# Patient Record
Sex: Male | Born: 1935
Health system: Southern US, Community
[De-identification: ages and names within clinical notes are randomized; demographics above are authoritative.]

## PROBLEM LIST (undated history)

## (undated) DIAGNOSIS — N189 Chronic kidney disease, unspecified: Secondary | ICD-10-CM

## (undated) DIAGNOSIS — M1990 Unspecified osteoarthritis, unspecified site: Secondary | ICD-10-CM

## (undated) DIAGNOSIS — I639 Cerebral infarction, unspecified: Secondary | ICD-10-CM

## (undated) DIAGNOSIS — Z8719 Personal history of other diseases of the digestive system: Secondary | ICD-10-CM

## (undated) DIAGNOSIS — I10 Essential (primary) hypertension: Secondary | ICD-10-CM

## (undated) DIAGNOSIS — I4891 Unspecified atrial fibrillation: Secondary | ICD-10-CM

## (undated) DIAGNOSIS — E119 Type 2 diabetes mellitus without complications: Secondary | ICD-10-CM

## (undated) DIAGNOSIS — Z950 Presence of cardiac pacemaker: Secondary | ICD-10-CM

## (undated) DIAGNOSIS — K439 Ventral hernia without obstruction or gangrene: Secondary | ICD-10-CM

## (undated) DIAGNOSIS — C61 Malignant neoplasm of prostate: Secondary | ICD-10-CM

## (undated) DIAGNOSIS — I495 Sick sinus syndrome: Secondary | ICD-10-CM

## (undated) HISTORY — PX: HEMORRHOID SURGERY: SHX153

## (undated) HISTORY — PX: COLON SURGERY: SHX602

## (undated) HISTORY — DX: Unspecified atrial fibrillation: I48.91

## (undated) HISTORY — PX: HERNIA REPAIR: SHX51

## (undated) HISTORY — DX: Sick sinus syndrome: I49.5

## (undated) HISTORY — PX: INSERT / REPLACE / REMOVE PACEMAKER: SUR710

## (undated) HISTORY — DX: Cerebral infarction, unspecified: I63.9

---

## 1997-10-26 ENCOUNTER — Other Ambulatory Visit: Admission: RE | Admit: 1997-10-26 | Discharge: 1997-10-26 | Payer: Self-pay | Admitting: Internal Medicine

## 1998-03-20 ENCOUNTER — Encounter: Payer: Self-pay | Admitting: Internal Medicine

## 1998-03-20 ENCOUNTER — Ambulatory Visit (HOSPITAL_COMMUNITY): Admission: RE | Admit: 1998-03-20 | Discharge: 1998-03-20 | Payer: Self-pay | Admitting: Internal Medicine

## 1999-07-25 ENCOUNTER — Emergency Department (HOSPITAL_COMMUNITY): Admission: EM | Admit: 1999-07-25 | Discharge: 1999-07-25 | Payer: Self-pay | Admitting: Emergency Medicine

## 2000-03-30 ENCOUNTER — Inpatient Hospital Stay (HOSPITAL_COMMUNITY): Admission: EM | Admit: 2000-03-30 | Discharge: 2000-04-02 | Payer: Self-pay

## 2000-03-30 ENCOUNTER — Encounter: Payer: Self-pay | Admitting: Nephrology

## 2000-04-01 ENCOUNTER — Encounter: Payer: Self-pay | Admitting: Cardiovascular Disease

## 2000-04-15 ENCOUNTER — Ambulatory Visit (HOSPITAL_COMMUNITY): Admission: RE | Admit: 2000-04-15 | Discharge: 2000-04-15 | Payer: Self-pay | Admitting: Internal Medicine

## 2000-05-20 HISTORY — PX: HIATAL HERNIA REPAIR: SHX195

## 2001-01-25 ENCOUNTER — Encounter: Payer: Self-pay | Admitting: Internal Medicine

## 2001-01-25 ENCOUNTER — Emergency Department (HOSPITAL_COMMUNITY): Admission: EM | Admit: 2001-01-25 | Discharge: 2001-01-25 | Payer: Self-pay | Admitting: *Deleted

## 2001-01-26 ENCOUNTER — Encounter: Payer: Self-pay | Admitting: Emergency Medicine

## 2001-01-26 ENCOUNTER — Encounter: Payer: Self-pay | Admitting: Gynecology

## 2001-01-26 ENCOUNTER — Inpatient Hospital Stay (HOSPITAL_COMMUNITY): Admission: EM | Admit: 2001-01-26 | Discharge: 2001-02-03 | Payer: Self-pay | Admitting: Emergency Medicine

## 2001-01-27 ENCOUNTER — Encounter: Payer: Self-pay | Admitting: Internal Medicine

## 2001-01-28 ENCOUNTER — Encounter: Payer: Self-pay | Admitting: Internal Medicine

## 2001-01-30 ENCOUNTER — Encounter: Payer: Self-pay | Admitting: Internal Medicine

## 2001-03-12 ENCOUNTER — Ambulatory Visit (HOSPITAL_COMMUNITY): Admission: RE | Admit: 2001-03-12 | Discharge: 2001-03-12 | Payer: Self-pay | Admitting: Internal Medicine

## 2001-03-12 ENCOUNTER — Encounter: Payer: Self-pay | Admitting: Internal Medicine

## 2001-03-31 ENCOUNTER — Encounter (HOSPITAL_BASED_OUTPATIENT_CLINIC_OR_DEPARTMENT_OTHER): Payer: Self-pay | Admitting: General Surgery

## 2001-03-31 ENCOUNTER — Inpatient Hospital Stay (HOSPITAL_COMMUNITY): Admission: RE | Admit: 2001-03-31 | Discharge: 2001-04-03 | Payer: Self-pay | Admitting: General Surgery

## 2001-03-31 ENCOUNTER — Encounter (INDEPENDENT_AMBULATORY_CARE_PROVIDER_SITE_OTHER): Payer: Self-pay | Admitting: *Deleted

## 2001-03-31 HISTORY — PX: CHOLECYSTECTOMY OPEN: SUR202

## 2001-04-04 ENCOUNTER — Inpatient Hospital Stay (HOSPITAL_COMMUNITY): Admission: EM | Admit: 2001-04-04 | Discharge: 2001-04-07 | Payer: Self-pay | Admitting: Emergency Medicine

## 2001-04-04 ENCOUNTER — Encounter: Payer: Self-pay | Admitting: Emergency Medicine

## 2001-04-05 ENCOUNTER — Encounter (HOSPITAL_BASED_OUTPATIENT_CLINIC_OR_DEPARTMENT_OTHER): Payer: Self-pay | Admitting: General Surgery

## 2003-03-29 ENCOUNTER — Encounter: Admission: RE | Admit: 2003-03-29 | Discharge: 2003-03-29 | Payer: Self-pay | Admitting: *Deleted

## 2003-12-07 ENCOUNTER — Emergency Department (HOSPITAL_COMMUNITY): Admission: EM | Admit: 2003-12-07 | Discharge: 2003-12-08 | Payer: Self-pay | Admitting: Emergency Medicine

## 2004-08-18 HISTORY — PX: EXPLORATORY LAPAROTOMY: SUR591

## 2004-08-18 HISTORY — PX: INCISIONAL HERNIA REPAIR: SHX193

## 2004-09-16 ENCOUNTER — Encounter (INDEPENDENT_AMBULATORY_CARE_PROVIDER_SITE_OTHER): Payer: Self-pay | Admitting: *Deleted

## 2004-09-16 ENCOUNTER — Inpatient Hospital Stay (HOSPITAL_COMMUNITY): Admission: EM | Admit: 2004-09-16 | Discharge: 2004-09-28 | Payer: Self-pay | Admitting: Emergency Medicine

## 2004-09-16 ENCOUNTER — Ambulatory Visit: Payer: Self-pay | Admitting: Internal Medicine

## 2004-09-16 HISTORY — PX: SMALL INTESTINE SURGERY: SHX150

## 2004-09-19 ENCOUNTER — Encounter (INDEPENDENT_AMBULATORY_CARE_PROVIDER_SITE_OTHER): Payer: Self-pay | Admitting: Cardiovascular Disease

## 2004-10-11 ENCOUNTER — Ambulatory Visit: Payer: Self-pay

## 2005-01-29 ENCOUNTER — Ambulatory Visit: Payer: Self-pay | Admitting: Internal Medicine

## 2005-10-24 ENCOUNTER — Ambulatory Visit: Payer: Self-pay

## 2006-04-08 ENCOUNTER — Ambulatory Visit: Payer: Self-pay | Admitting: Internal Medicine

## 2006-07-09 ENCOUNTER — Ambulatory Visit: Payer: Self-pay | Admitting: Internal Medicine

## 2006-10-14 ENCOUNTER — Ambulatory Visit: Payer: Self-pay | Admitting: Internal Medicine

## 2007-01-12 ENCOUNTER — Ambulatory Visit: Payer: Self-pay | Admitting: Internal Medicine

## 2007-04-07 ENCOUNTER — Ambulatory Visit: Payer: Self-pay | Admitting: Internal Medicine

## 2007-07-08 ENCOUNTER — Ambulatory Visit: Payer: Self-pay | Admitting: Internal Medicine

## 2007-10-07 ENCOUNTER — Ambulatory Visit: Payer: Self-pay | Admitting: Internal Medicine

## 2007-12-02 ENCOUNTER — Inpatient Hospital Stay (HOSPITAL_COMMUNITY): Admission: EM | Admit: 2007-12-02 | Discharge: 2007-12-07 | Payer: Self-pay | Admitting: Emergency Medicine

## 2008-01-15 ENCOUNTER — Ambulatory Visit: Payer: Self-pay | Admitting: Internal Medicine

## 2008-04-19 ENCOUNTER — Ambulatory Visit: Payer: Self-pay | Admitting: Internal Medicine

## 2008-07-20 ENCOUNTER — Ambulatory Visit: Payer: Self-pay | Admitting: Internal Medicine

## 2008-09-02 ENCOUNTER — Encounter (INDEPENDENT_AMBULATORY_CARE_PROVIDER_SITE_OTHER): Payer: Self-pay | Admitting: *Deleted

## 2008-11-14 DIAGNOSIS — I480 Paroxysmal atrial fibrillation: Secondary | ICD-10-CM | POA: Insufficient documentation

## 2008-11-14 DIAGNOSIS — E119 Type 2 diabetes mellitus without complications: Secondary | ICD-10-CM | POA: Insufficient documentation

## 2008-11-14 DIAGNOSIS — I4891 Unspecified atrial fibrillation: Secondary | ICD-10-CM

## 2008-11-14 DIAGNOSIS — I495 Sick sinus syndrome: Secondary | ICD-10-CM

## 2008-11-15 ENCOUNTER — Ambulatory Visit: Payer: Self-pay | Admitting: Internal Medicine

## 2008-11-21 ENCOUNTER — Encounter: Payer: Self-pay | Admitting: Internal Medicine

## 2009-01-31 ENCOUNTER — Ambulatory Visit: Payer: Self-pay | Admitting: Internal Medicine

## 2009-02-09 ENCOUNTER — Encounter: Payer: Self-pay | Admitting: Internal Medicine

## 2009-02-09 ENCOUNTER — Inpatient Hospital Stay (HOSPITAL_COMMUNITY): Admission: EM | Admit: 2009-02-09 | Discharge: 2009-02-11 | Payer: Self-pay | Admitting: Emergency Medicine

## 2009-02-10 ENCOUNTER — Encounter (INDEPENDENT_AMBULATORY_CARE_PROVIDER_SITE_OTHER): Payer: Self-pay | Admitting: Internal Medicine

## 2009-05-03 ENCOUNTER — Encounter: Payer: Self-pay | Admitting: Internal Medicine

## 2009-05-03 ENCOUNTER — Ambulatory Visit: Payer: Self-pay | Admitting: Internal Medicine

## 2009-05-15 ENCOUNTER — Encounter: Payer: Self-pay | Admitting: Internal Medicine

## 2009-08-02 ENCOUNTER — Ambulatory Visit: Payer: Self-pay | Admitting: Internal Medicine

## 2009-08-15 ENCOUNTER — Encounter: Payer: Self-pay | Admitting: Internal Medicine

## 2009-11-01 ENCOUNTER — Ambulatory Visit: Payer: Self-pay | Admitting: Internal Medicine

## 2009-11-02 ENCOUNTER — Encounter: Payer: Self-pay | Admitting: Internal Medicine

## 2009-11-16 ENCOUNTER — Encounter: Payer: Self-pay | Admitting: Internal Medicine

## 2010-01-21 ENCOUNTER — Inpatient Hospital Stay (HOSPITAL_COMMUNITY): Admission: EM | Admit: 2010-01-21 | Discharge: 2010-01-25 | Payer: Self-pay | Admitting: Emergency Medicine

## 2010-02-20 ENCOUNTER — Ambulatory Visit (HOSPITAL_COMMUNITY): Admission: RE | Admit: 2010-02-20 | Discharge: 2010-02-20 | Payer: Self-pay | Admitting: Urology

## 2010-03-12 ENCOUNTER — Ambulatory Visit
Admission: RE | Admit: 2010-03-12 | Discharge: 2010-06-10 | Payer: Self-pay | Source: Home / Self Care | Attending: Radiation Oncology | Admitting: Radiation Oncology

## 2010-03-13 ENCOUNTER — Ambulatory Visit: Payer: Self-pay | Admitting: Internal Medicine

## 2010-03-13 DIAGNOSIS — Z950 Presence of cardiac pacemaker: Secondary | ICD-10-CM | POA: Insufficient documentation

## 2010-04-06 ENCOUNTER — Inpatient Hospital Stay (HOSPITAL_COMMUNITY)
Admission: EM | Admit: 2010-04-06 | Discharge: 2010-04-10 | Payer: Self-pay | Source: Home / Self Care | Admitting: Emergency Medicine

## 2010-06-10 ENCOUNTER — Inpatient Hospital Stay (HOSPITAL_COMMUNITY)
Admission: EM | Admit: 2010-06-10 | Discharge: 2010-06-14 | Payer: Self-pay | Source: Home / Self Care | Attending: Internal Medicine | Admitting: Internal Medicine

## 2010-06-10 ENCOUNTER — Encounter: Payer: Self-pay | Admitting: Internal Medicine

## 2010-06-12 LAB — CBC
HCT: 38.1 % — ABNORMAL LOW (ref 39.0–52.0)
HCT: 31.9 % — ABNORMAL LOW (ref 39.0–52.0)
Hemoglobin: 13 g/dL (ref 13.0–17.0)
Hemoglobin: 10.7 g/dL — ABNORMAL LOW (ref 13.0–17.0)
MCHC: 34.1 g/dL (ref 30.0–36.0)
WBC: 11.4 10*3/uL — ABNORMAL HIGH (ref 4.0–10.5)
WBC: 4.6 10*3/uL (ref 4.0–10.5)

## 2010-06-12 LAB — HEMOGLOBIN A1C
Hgb A1c MFr Bld: 6.5 % — ABNORMAL HIGH (ref <5.7)
Mean Plasma Glucose: 140 mg/dL — ABNORMAL HIGH (ref <117)

## 2010-06-12 LAB — URINALYSIS, ROUTINE W REFLEX MICROSCOPIC
Ketones, ur: NEGATIVE mg/dL
Nitrite: NEGATIVE
Protein, ur: NEGATIVE mg/dL
Urine Glucose, Fasting: NEGATIVE mg/dL

## 2010-06-12 LAB — BASIC METABOLIC PANEL
CO2: 27 mEq/L (ref 19–32)
CO2: 25 mEq/L (ref 19–32)
Calcium: 9.7 mg/dL (ref 8.4–10.5)
GFR calc Af Amer: >60 mL/min (ref >60)
GFR calc non Af Amer: 57 mL/min — ABNORMAL LOW (ref >60)
Glucose, Bld: 194 mg/dL — ABNORMAL HIGH (ref 70–99)
Glucose, Bld: 96 mg/dL (ref 70–99)
Potassium: 4.1 mEq/L (ref 3.5–5.1)
Potassium: 4.1 mEq/L (ref 3.5–5.1)
Sodium: 139 mEq/L (ref 135–145)
Sodium: 141 mEq/L (ref 135–145)

## 2010-06-12 LAB — DIFFERENTIAL
Basophils Absolute: 0 10*3/uL (ref 0.0–0.1)
Basophils Relative: 0 % (ref 0–1)
Lymphocytes Relative: 11 % — ABNORMAL LOW (ref 12–46)
Monocytes Absolute: 0.8 10*3/uL (ref 0.1–1.0)
Neutro Abs: 9.3 10*3/uL — ABNORMAL HIGH (ref 1.7–7.7)
Neutrophils Relative %: 82 % — ABNORMAL HIGH (ref 43–77)

## 2010-06-12 LAB — GLUCOSE, CAPILLARY
Glucose-Capillary: 110 mg/dL — ABNORMAL HIGH (ref 70–99)
Glucose-Capillary: 127 mg/dL — ABNORMAL HIGH (ref 70–99)
Glucose-Capillary: 71 mg/dL (ref 70–99)
Glucose-Capillary: 81 mg/dL (ref 70–99)
Glucose-Capillary: 81 mg/dL (ref 70–99)

## 2010-06-12 LAB — PROTIME-INR: INR: 2.31 — ABNORMAL HIGH (ref 0.00–1.49)

## 2010-06-13 LAB — GLUCOSE, CAPILLARY
Glucose-Capillary: 125 mg/dL — ABNORMAL HIGH (ref 70–99)
Glucose-Capillary: 174 mg/dL — ABNORMAL HIGH (ref 70–99)
Glucose-Capillary: 125 mg/dL — ABNORMAL HIGH (ref 70–99)
Glucose-Capillary: 124 mg/dL — ABNORMAL HIGH (ref 70–99)
Glucose-Capillary: 110 mg/dL — ABNORMAL HIGH (ref 70–99)
Glucose-Capillary: 129 mg/dL — ABNORMAL HIGH (ref 70–99)
Glucose-Capillary: 94 mg/dL (ref 70–99)

## 2010-06-13 LAB — CBC
Hemoglobin: 11 g/dL — ABNORMAL LOW (ref 13.0–17.0)
Hemoglobin: 11.9 g/dL — ABNORMAL LOW (ref 13.0–17.0)
MCH: 25.2 pg — ABNORMAL LOW (ref 26.0–34.0)
MCHC: 34 g/dL (ref 30.0–36.0)
MCV: 74.8 fL — ABNORMAL LOW (ref 78.0–100.0)
RBC: 4.37 MIL/uL (ref 4.22–5.81)
RBC: 4.66 MIL/uL (ref 4.22–5.81)

## 2010-06-13 LAB — BASIC METABOLIC PANEL
Chloride: 113 mEq/L — ABNORMAL HIGH (ref 96–112)
GFR calc Af Amer: >60 mL/min (ref >60)
GFR calc Af Amer: >60 mL/min (ref >60)
GFR calc non Af Amer: 53 mL/min — ABNORMAL LOW (ref >60)
GFR calc non Af Amer: >60 mL/min (ref >60)
Glucose, Bld: 109 mg/dL — ABNORMAL HIGH (ref 70–99)
Potassium: 4.5 mEq/L (ref 3.5–5.1)
Potassium: 4 mEq/L (ref 3.5–5.1)
Sodium: 144 mEq/L (ref 135–145)
Sodium: 143 mEq/L (ref 135–145)

## 2010-06-13 LAB — PROTIME-INR
INR: 2.3 — ABNORMAL HIGH (ref 0.00–1.49)
Prothrombin Time: 24.4 seconds — ABNORMAL HIGH (ref 11.6–15.2)
Prothrombin Time: 25.4 seconds — ABNORMAL HIGH (ref 11.6–15.2)

## 2010-06-14 ENCOUNTER — Encounter: Payer: Self-pay | Admitting: Internal Medicine

## 2010-06-14 LAB — HEMOCCULT GUIAC POC 1CARD (OFFICE): Fecal Occult Bld: NEGATIVE

## 2010-06-14 LAB — CBC
HCT: 31.9 % — ABNORMAL LOW (ref 39.0–52.0)
MCHC: 33.9 g/dL (ref 30.0–36.0)
MCHC: 34.1 g/dL (ref 30.0–36.0)
Platelets: 107 10*3/uL — ABNORMAL LOW (ref 150–400)
RBC: 4.14 MIL/uL — ABNORMAL LOW (ref 4.22–5.81)
RDW: 16.2 % — ABNORMAL HIGH (ref 11.5–15.5)
RDW: 16.3 % — ABNORMAL HIGH (ref 11.5–15.5)
WBC: 4.5 10*3/uL (ref 4.0–10.5)

## 2010-06-14 LAB — BASIC METABOLIC PANEL
Chloride: 111 mEq/L (ref 96–112)
GFR calc Af Amer: >60 mL/min (ref >60)
Potassium: 3.9 mEq/L (ref 3.5–5.1)

## 2010-06-14 LAB — GLUCOSE, CAPILLARY
Glucose-Capillary: 101 mg/dL — ABNORMAL HIGH (ref 70–99)
Glucose-Capillary: 111 mg/dL — ABNORMAL HIGH (ref 70–99)

## 2010-06-15 LAB — GLUCOSE, CAPILLARY: Glucose-Capillary: 99 mg/dL (ref 70–99)

## 2010-06-18 ENCOUNTER — Ambulatory Visit
Admission: RE | Admit: 2010-06-18 | Discharge: 2010-06-19 | Payer: Self-pay | Source: Home / Self Care | Attending: Radiation Oncology | Admitting: Radiation Oncology

## 2010-06-18 NOTE — Consult Note (Signed)
[name], Craig              ACCOUNT NO.:  [id number]  MEDICAL RECORD NO.:  [id number]          PATIENT TYPE:  INP  LOCATION:  1437                         FACILITY:  [facility]  PHYSICIAN:  Velora Heckler, MD      DATE OF BIRTH:  05-25-1923  DATE OF CONSULTATION:  06/10/2010 DATE OF DISCHARGE:                                CONSULTATION   REFERRING PHYSICIAN:  Isidor Holts, M.D.  CHIEF COMPLAINT:  Recurrent small bowel obstruction.  HISTORY OF PRESENT ILLNESS:  [name] is an 77 year old black male who is readmitted to the medical service at [facility] with recurrent small bowel obstruction.  The patient has had intermittent episodes of obstruction dating back several years.  His last operative intervention was in 2006 by Dr. Consuello Bossier when he underwent exploratory laparotomy with small bowel resection for obstruction.  Since that time, the patient has had recurrence of his ventral incisional hernia.  He has also had repeated admissions for small bowel obstruction.  The patient's most recent admission was November 2011.  The patient is currently undergoing external beam radiation therapy for treatment of prostate cancer by Dr. Margaretmary Dys at the [facility].  The patient has had 15 treatments.  He is due for treatment again tomorrow.  However, he presents to the emergency department with signs of small bowel obstruction.  The patient was seen and evaluated today in the emergency department at [facility].  He underwent abdominal x-ray showing findings consistent with distal small bowel obstruction.  He then underwent an abdominal and pelvic CT scan, which showed dilated small bowel loops with air-fluid levels.  There was a transition point noted in the right lower quadrant of the abdomen consistent with adhesions.  The patient is admitted to the medical service due to multiple concerns.  General Surgery is asked  to survey consultative role.  PAST MEDICAL HISTORY: 1. Coronary artery disease. 2. Pacemaker placement. 3. Atrial fibrillation, on chronic anticoagulation. 4. Cardiomyopathy. 5. Hypertension. 6. Gastroesophageal reflux with hiatal hernia. 7. History of hiatal hernia repair. 8. History of recurrent ventral incisional hernias, status post     cholecystectomy. 9. History of recurrent small bowel obstruction. 10.History of type 2 diabetes.  HOME MEDICATIONS:  Coumadin, Benicar, simethicone, metoprolol, Xalatan, Cardizem.  ALLERGIES:  None known.  SOCIAL HISTORY:  The patient is a retired Engineer, materials.  He denies alcohol or tobacco use.  He has no history of drug use.  He is accompanied by a family member.  FAMILY HISTORY:  Notable in the medical record.  15-system review otherwise negative except with findings as noted above.  PHYSICAL EXAMINATION:  GENERAL:  An 77 year old bright, alert black male in no acute distress. VITAL SIGNS:  Temperature 97.8, pulse 77, respirations 16, blood pressure 139/78, O2 saturation 98% room air.HEENT:  Normocephalic, atraumatic.  There is an area of soft tissue swelling on the left anterior forehead, which appears chronic.  There is no sign of jaundice.  Dentition is poor.  Mucous membranes moist.  Voice normal. NECK:  Palpation of the neck shows no thyroid nodularity.  No  masses. No lymphadenopathy.  No tenderness.  LUNGS:  Clear to auscultation bilaterally. CARDIAC:  Irregular rhythm with a rate of approximately 80.  No significant murmur.  Peripheral pulses are full. EXTREMITIES:  Nontender without significant edema. ABDOMEN:  Soft, slightly protuberant.  There are bowel sounds on auscultation.  There is a ventral incisional hernia in the midabdomen to the right of midline.  There is a well-healed midline incision and right subcostal incision.  There is no significant tenderness.  There are no significant masses. NEUROLOGIC:  The  patient is alert and oriented without gross neurologic deficit.  LABORATORY STUDIES:  White count 11.4, hemoglobin 13.0, platelet count 126,000.  Electrolytes are normal.  RADIOGRAPHY:  Plain abdominal x-rays showing findings consistent with distal small bowel obstruction.  CT scan of the abdomen and pelvis showing small bowel obstruction with air-fluid levels and a transition point in the right lower quadrant.  IMPRESSION: 1. Small bowel obstruction, likely secondary to adhesions. 2. Recurrent ventral incisional hernia. 3. Prostate cancer with ongoing external beam radiation therapy. 4. Coronary artery disease. 5. Pacemaker placement. 6. Atrial fibrillation, on chronic anticoagulation. 7. Hypertension.  RECOMMENDATIONS: 1. Agree with admission to medical service with nasogastric     decompression, n.p.o. status, and intravenous hydration. 2. Hold anticoagulation. 3. Notify radiation therapy of admission. 4. Given recurrent episodes of obstruction, the patient will likely     come to laparotomy at some point. 5. [facility], General Surgery Service will follow the     patient during this hospitalization with you.  Velora Heckler, MD    TMG/MEDQ  D:  06/10/2010  T:  06/11/2010  Job:  [id number]  cc:   Isidor Holts, M.D.  Electronically Signed by Darnell Level MD on 06/18/2010 10:20:23 AM

## 2010-06-18 NOTE — H&P (Signed)
[name], [name]              ACCOUNT NO.:  [id number]  MEDICAL RECORD NO.:  [id number]          PATIENT TYPE:  INP  LOCATION:  0101                         FACILITY:  [facility]  PHYSICIAN:  Isidor Holts, M.D.  DATE OF BIRTH:  1924-01-12  DATE OF ADMISSION:  06/10/2010 DATE OF DISCHARGE:                             HISTORY & PHYSICAL   PRIMARY MD:  Margaretmary Bayley, M.D.  PRIMARY CARDIOLOGIST/ELECTROPHYSIOLOGIST:  Doylene Canning. Ladona Ridgel, MD  CHIEF COMPLAINT:  Abdominal pain/vomiting for 2 days.  HISTORY OF PRESENT ILLNESS:  This is an 77 year old male.  For past medical history, see below.  He has a history of recurrent small bowel obstructions and as a matter of fact, is status post hospitalization from April 06, 2010, to April 10, 2010, for partial small-bowel obstruction.  Since discharge, he has been in his usual state of health until June 09, 2010.  He ate lunch at about 3:30 p.m. and then at about 8 p.m. he felt like going to the bathroom.  He went to the bathroom, moved his bowels, stools were normal but then suddenly he developed abdominal pain.  He subsequently vomited about 3 times. Vomitus consisted of food.  There was no hematemesis or coffee ground. He continued to have abdominal pain through the night and at about 6 a.m. on June 10, 2010, his niece brought him into the emergency department.  He denies chest pain.  Denies shortness of breath, fever or chills.  Denies dysuria or urinary frequency.  PAST MEDICAL HISTORY: 1. Recurrent small bowel obstruction. 2. History of coronary artery disease. 3. Tachy-brady syndrome status post permanent dual-chamber pacemaker. 4. Atrial fibrillation, on chronic anticoagulation. 5. Systolic cardiomyopathy, ejection fraction 40% to 50%. 6. Hypertension. 7. GERD/hiatal hernia. 8. Status post hiatal hernia repair. 9. Status post repair of incarcerated ventral hernia. 10.Status post cholecystectomy. 11.Status post  adhesiolysis and bowel resection, 2006. 12.Diet-controlled type 2 diabetes mellitus.  ALLERGIES:  No known drug allergies.  MEDICATIONS: 1. Coumadin 5 mg.  p.o. daily except on Wednesdays and Sundays, last     Coumadin dose was on June 09, 2010. 2. Benicar 40 mg p.o. daily. 3. Simethicone OTC 1 tablet p.o. p.r.n. 4. Metoprolol 25 mg p.o. daily. 5. Eye-Sine over-the-counter 1 drop to right eye daily. 6. Xalatan 0.005% ophthalmic solution, 1 drop in the left eye every     morning. 7. Cardizem CD 240 mg p.o. daily.  REVIEW OF SYSTEMS:  As per HPI and chief complaint, otherwise negative. The patient denies lower extremity swelling.  Denies headache or blurring of vision.  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  He is now retired.  He had 1 daughter who is now deceased.  He in a nonsmoker, nondrinker.  Has no history of drug abuse.  He is quite independent and ambulates without a cane.  His niece comes very frequently to help him out.  FAMILY HISTORY:  The patient's father died in his 28s from pneumonia, he was a smoker.  His mother died in her 1s to 3s, the patient is uncertain of the  course.  PHYSICAL EXAMINATION:  VITAL SIGNS:  Temperature 97.8, pulse 77 per minute and regular, respiratory rate 16, BP 139/78 mmHg, pulse oximeter 98% on room air. GENERAL:  The patient appeared to be in obvious acute distress at the time of the evaluation, alert, communicative, not short of breath at rest.  He has some clotted blood, both nostrils, likely secondary to NG tube placement.  He did not appear to be in acute pain. HEENT:  No clinical pallor, no jaundice, no conjunctival injection. Head is atraumatic, normocephalic. NECK:  Supple.  JVP not seen.  No palpable lymphadenopathy.  No palpable goiter. CHEST:  Left subclavian pacemaker pouch is noted.  Chest is clear to auscultation.  No wheezes or  crackles. HEART:  Sounds S1, S2 heard, normal and irregular.  No murmurs. ABDOMEN:  Soft, nondistended, tympanitic mildly, nontender, although the patient does admit to some discomfort on palpation.  Ventral hernia is visible.  Bowel sounds are heard.  Unable to palpate organs. EXTREMITIES:  Lower extremity examination, no pitting edema, palpable peripheral pulses. MUSCULOSKELETAL SYSTEM:  Remarkable only for generalized osteoarthritic changes. CENTRAL NERVOUS SYSTEM:  No focal neurologic deficit on gross examination.  INVESTIGATIONS:  CBC:  WBC 11.5, hemoglobin 13.3, hematocrit 38.1, platelets 126.  Electrolytes:  Sodium 139, potassium 4.1, chloride 100, CO2 of 27, BUN 10, creatinine 1.22, glucose 194.  Urinalysis is negative.  INR is 2.31.  Abdominal/chest x-ray, June 10, 2010, shows no acute kidney or pulmonary disease.  There was distal small bowel obstruction and small hiatal hernia.  Abdominal/pelvic CT scan June 10, 2010, showed dilated small bowel loops with air-fluid levels and also transition point in the right lower quadrant.  Repeat abdominal x- ray of June 10, 2010, shows NG tube causing distal esophagus/hiatal hernia.  A 12-lead EKG, June 10, 2010,  shows atrial fibrillation, occasional ventricular ectopics/paced complexes.  No acute ischemic findings.  ASSESSMENT AND PLAN: 1. Recurrent small bowel obstruction.  This appears to be mechanical,     likely secondary to intra-abdominal adhesions.  We will admit the     patient, keep him n.p.o., place NG tube, if feasible. Manage with     intravenous fluid hydration and request surgical consultation, in     case surgery is indicated.  2. Anticoagulation.  We shall currently place Coumadin on hold and     allow INR to drift down, in case surgical intervention becomes     necessary.  It may prove necessary to reverse anticoagulation if     the patient goes to OR in the next day or two.  3. History of atrial  fibrillation/sick sinus syndrome.  We shall place him on     telemetry, although patient currently appears rate controlled.  4. Diet-controlled type 2 diabetes mellitus.  We shall follow serial     CBGs with sliding scale insulin coverage as indicated.  5. History of gastroesophageal reflux disease.  We shall manage with     proton pump inhibitor.    Further management will depend on clinical course.     Isidor Holts, M.D.     CO/MEDQ  D:  06/10/2010  T:  06/10/2010  Job:  [id number]  cc:   Margaretmary Bayley, M.D. Fax: [phone]  Doylene Canning. Ladona Ridgel, MD [address]  [address] [place] [address]  Electronically Signed by Isidor Holts M.D. on 06/18/2010 04:39:48 PM

## 2010-06-19 ENCOUNTER — Ambulatory Visit
Admission: RE | Admit: 2010-06-19 | Discharge: 2010-06-19 | Payer: Self-pay | Source: Home / Self Care | Attending: Internal Medicine | Admitting: Internal Medicine

## 2010-06-19 NOTE — Cardiovascular Report (Signed)
Summary: Office Visit Remote   Office Visit Remote   Imported By: Roderic Ovens 08/17/2009 11:49:45  _____________________________________________________________________  External Attachment:    Type:   Image     Comment:   External Document

## 2010-06-19 NOTE — Assessment & Plan Note (Signed)
Summary: DEVICE/SAF   History of Present Illness: [name] returns today for followup. He is an 77 yo man with a h/o bradycardia and atrial fibrillation who has recently retired from working as a Electrical engineer.  He denies c/p or sob.  No peripheral edema.  He admits to being more sedentary since he retired.  No other complaints.  Current Medications (verified): 1)  Nateglinide 120 Mg Tabs (Nateglinide) .Marland Kitchen.. 1 By Mouth Two Times A Day Ac 2)  Metoprolol Tartrate 25 Mg Tabs (Metoprolol Tartrate) .... Take One Tablet By Mouth Twice A Day 3)  Diltiazem Hcl Er Beads 240 Mg Xr24h-Cap (Diltiazem Hcl Er Beads) .... Take One Capsule By Mouth Daily 4)  Benicar 40 Mg Tabs (Olmesartan Medoxomil) .... Take One Tablet By Mouth Daily 5)  Warfarin Sodium 5 Mg Tabs (Warfarin Sodium) .... Use As Directed By Anticoagulation Clinic  Allergies (verified): No Known Drug Allergies  Past History:  Past Medical History: Last updated: 11/14/2008 BRADYCARDIA-TACHYCARDIA SYNDROME (ICD-427.81) DIABETES MELLITUS (ICD-250.00) ATRIAL FIBRILLATION (ICD-427.31)  Past Surgical History: Last updated: 11/14/2008  1. Laparoscopic converted to open cholecystectomy.   2. Exploratory laparotomy with lysis of adhesions and small bowel       resection in 2006.   3. Placement of pacemaker.   Hiatal hernia repair ten years ago and hemorrhoid surgery.  Review of Systems  The patient denies chest pain, syncope, dyspnea on exertion, and peripheral edema.    Vital Signs:  Patient profile:   77 year old male Height:      71 inches Weight:      168 pounds BMI:     23.52 Pulse rate:   71 / minute BP sitting:   130 / 90  (left arm)  Vitals Entered By: [name] CMA (March 13, 2010 3:29 PM)  Physical Exam  General:  Well developed, well nourished, in no acute distress. Head:  normocephalic and atraumatic Eyes:  PERRLA/EOM intact; conjunctiva and lids normal. Mouth:  Teeth, gums and palate normal. Oral mucosa  normal. Neck:  Neck supple, no JVD. No masses, thyromegaly or abnormal cervical nodes. Chest Wall:  Well healed PPM incision. Lungs:  Clear bilaterally to auscultation with no wheezes, rales, or rhonch. Heart:  RRR with normal S1 and S2.  PMI is not enlarged or laterally displaced. Abdomen:  Bowel sounds positive; abdomen soft and non-tender without masses, organomegaly, or hernias noted. No hepatosplenomegaly. Msk:  Back normal, normal gait. Muscle strength and tone normal. Pulses:  pulses normal in all 4 extremities Extremities:  No clubbing or cyanosis. Neurologic:  Alert and oriented x 3.   PPM Specifications Following MD:  Lewayne Bunting, MD     PPM Vendor:  [employer]     PPM Model Number:  [id number]     PPM Serial Number:  [id number] PPM DOI:  09/26/2004      Lead 1    Location: RA     DOI: 09/26/2004     Model #: [id number]     Serial #: [id number]     Status: active Lead 2    Location: RV     DOI: 09/26/2005     Model #: [id number]     Serial #: [id number]     Status: active   Indications:  TACHY-BRADY SYNDROME    PPM Follow Up Battery Voltage:  2.93 V     Pacer Dependent:  No       PPM Device Measurements Atrium  Amplitude: 0.7 mV, Impedance: 528 ohms,  Right  Ventricle  Amplitude: 20.3 mV, Impedance: 440 ohms, Threshold: 1.0 V at 0.4 msec  Episodes MS Episodes:  1     Percent Mode Switch:  100%     Coumadin:  Yes Ventricular High Rate:  0     Ventricular Pacing:  51.5%  Parameters Mode:  VVIR     Lower Rate Limit:  60     Upper Rate Limit:  130 Next Remote Date:  06/14/2010     Next Cardiology Appt Due:  02/18/2011 Tech Comments:  PT IN AF 100% OF TIME. + COUMADIN.  NORMAL DEVICE FUNCTION.  NO CHANGES MADE. CARELINK TRANSMISSION 06-14-10 AND ROV IN 12 MTHS W/GT. Vella Kohler  March 13, 2010 3:25 PM MD Comments:  Agree with above.  Impression & Recommendations:  Problem # 1:  CARDIAC PACEMAKER IN SITU (ICD-V45.01) His device is working normally.  Will recheck in several  months.  Problem # 2:  ATRIAL FIBRILLATION (ICD-427.31) He remains in atrial fib but his rate appears to be controlled. His updated medication list for this problem includes:    Metoprolol Tartrate 25 Mg Tabs (Metoprolol tartrate) .Marland Kitchen... Take one tablet by mouth twice a day    Warfarin Sodium 5 Mg Tabs (Warfarin sodium) ..... Use as directed by anticoagulation clinic  Patient Instructions: 1)  Your physician wants you to follow-up in:   12 months with Dr Court Joy will receive a reminder letter in the mail two months in advance. If you don't receive a letter, please call our office to schedule the follow-up appointment. 2)  [facility] transmission 06/14/09

## 2010-06-19 NOTE — Letter (Signed)
Summary: Remote Device Check  [employer] [facility], Main Office  [address]   [place], [place] [address]   Phone: [phone]  Fax: [phone]     November 16, 2009 MRN: [id number]   [facility] [address] [place], [place]  [address]   Dear Mr. Herbster,   Your remote transmission was recieved and reviewed by your physician.  All diagnostics were within normal limits for you.   __X____Your next office visit is scheduled for:  September with Dr Ladona Ridgel.                              Please call our office to schedule an appointment.    Sincerely,  Vella Kohler

## 2010-06-19 NOTE — Cardiovascular Report (Signed)
Summary: Office Visit Remote   Office Visit Remote   Imported By: Roderic Ovens 11/17/2009 16:36:24  _____________________________________________________________________  External Attachment:    Type:   Image     Comment:   External Document

## 2010-06-19 NOTE — Cardiovascular Report (Signed)
Summary: Office Visit   Office Visit   Imported By: Roderic Ovens 03/16/2010 16:12:08  _____________________________________________________________________  External Attachment:    Type:   Image     Comment:   External Document

## 2010-06-19 NOTE — Letter (Signed)
Summary: Remote Device Check  [facility], Main Office  [address]   [place], [place] [address]   Phone: [phone]  Fax: [phone]     August 15, 2009 MRN: [id number]   [facility] [address] [place], [place]  [address]   Dear [name],   Your remote transmission was recieved and reviewed by your physician.  All diagnostics were within normal limits for you.  __X___Your next transmission is scheduled for:   November 01, 2009.  Please transmit at any time this day.  If you have a wireless device your transmission will be sent automatically.     Sincerely,  Proofreader

## 2010-06-20 ENCOUNTER — Ambulatory Visit: Payer: MEDICARE | Attending: Radiation Oncology | Admitting: Radiation Oncology

## 2010-06-20 DIAGNOSIS — Z510 Encounter for antineoplastic radiation therapy: Secondary | ICD-10-CM | POA: Insufficient documentation

## 2010-06-20 DIAGNOSIS — C61 Malignant neoplasm of prostate: Secondary | ICD-10-CM | POA: Insufficient documentation

## 2010-06-25 NOTE — Discharge Summary (Signed)
[name], [name]              ACCOUNT NO.:  [id number]  MEDICAL RECORD NO.:  [id number]          PATIENT TYPE:  INP  LOCATION:  1437                         FACILITY:  [facility]  PHYSICIAN:  Marcellus Scott, MD     DATE OF BIRTH:  1923/09/09  DATE OF ADMISSION:  06/10/2010 DATE OF DISCHARGE:  06/14/2010                              DISCHARGE SUMMARY   PRIMARY CARE PHYSICIAN:  Margaretmary Bayley, M.D.  PRIMARY CARDIOLOGIST/ELECTROPHYSIOLOGIST:  Doylene Canning. Ladona Ridgel, M.D.  DISCHARGE DIAGNOSES: 1. Recurrent partial small-bowel obstruction secondary to adhesions,     resolved. 2. Atrial fibrillation with controlled ventricular rate and     anticoagulated. 3. Hypertension. 4. Anemia and thrombocytopenia. 5. Diet-controlled type 2 diabetes mellitus. 6. History of coronary artery disease, asymptomatic. 7. History of tachy-brady syndrome, status post dual-chamber pacemaker     placement. 8. Cardiomyopathy with ejection fraction of 40%-50%.9. History of gastroesophageal reflux disease/hiatal hernia. 10.History of prostate cancer.  DISCHARGE MEDICATIONS: 1. Warfarin reduced to 2.5 mg p.o. daily. 2. Benicar 40 mg p.o. daily. 3. Flomax 0.4 mg p.o. q.h.s. 4. Gas-X over-the-counter, 1 tablet p.o. p.r.n. for gas pain. 5. Metoprolol tartrate 25 mg p.o. daily. 6. Visine over-the-counter 1 drop in right eye daily p.r.n. for     allergies. 7. Xalatan 0.005% ophthalmic 1 drop in left eye q.p.m.  Discontinued medications or medications currently held are diltiazem CD 240 mg capsule.Marland Kitchen  PROCEDURES: 1. X-ray of the abdomen on the June 11, 2010 showed NG tube tip is     at the esophagogastric junction.  No evidence of bowel obstruction     or perforation. 2. Chest x-ray on the January 23, no acute cardiopulmonary findings. 3. Abdominal x-ray one-view on the June 10, 2010:  Impression:     Nonvisualization of the NG tube. 4. Abdominal x-ray on the June 10, 2010: Impression:  NG tube  coiled in the lower esophagus/large hiatal hernia. 5. CT of the abdomen and pelvis with contrast on June 10, 2010:     Impression:  1.  Dilated small bowel with diffuse air fluid levels,     transition point right lower quadrant to decompress small bowel     loops, suspect related to adhesions.  The patient is status post     right hemicolectomy.  2.  Negative for pneumatosis, free air or     abscess.  Large hiatal hernia.  Previous cholecystectomy.  3.     Anterior abdominal wall ventral hernias, which do not appear to be     resulting in the obstruction. 6. Abdominal x-ray on the June 10, 2010:  Impression:  Distal small     bowel obstruction and small hiatal hernia. 7. Chest x-ray on the June 10, 2010:  Impression:  No active     cardiopulmonary disease.  PERTINENT LABS:  CBC today, hemoglobin 11.6, hematocrit 34, white blood cell 5.8, platelets are 140.  Fecal occult blood testing was negative. Basic metabolic panel only remarkable for glucose of 101.  BUN was 2, creatinine 0.88.  INR yesterday was 2.3 despite being off Coumadin since admission on the June 10, 2010.  Hemoglobin A1c  of 6.5.  Urinalysis was negative for features of urinary tract infection.  CONSULTATIONS:  Surgery, Velora Heckler, M.D. and Juanetta Gosling, M.D.  DIET:  Heart healthy and diabetic diet as tolerated.  ACTIVITY:  Ad lib.  COMPLAINTS:  Complaints today, none.  Last night, patient reported to the nurse that he had dark stool; however, the nurse was unable to witness the stool.  Since he was anticoagulated, we decided to check his hemoglobin/hematocrit frequently and also check his stool for occult blood.  Today, again he indicated that he had a darkish stool; however, the nurse this time was able to witness the stool, which was dark brown in color and was guaiac negative.  Patient has tolerated solid diet and is not complaining of any abdominal pain or any other complaints.  PHYSICAL  EXAMINATION:  GENERAL:  Patient is ambulant and in no obvious distress. VITAL SIGNS:  Temperature 98.1 degrees Fahrenheit, pulse 86 per minute and regular, respirations 20 per minute, blood pressure 122/80 mmHg, saturating at 100% on room air.  CBGs range in the low 100s mostly. RESPIRATORY SYSTEM:  Clear. CARDIOVASCULAR SYSTEM:  First and second heart sounds heard, regular. No JVD. ABDOMEN:  Mildly distended or obese, which may be his usual size.  He has a ventral hernia, which is not complicated.  Nontender, soft and bowel sounds are normally heard. CENTRAL NERVOUS SYSTEM:  Patient is awake, alert, oriented x3 with no focal deficits.  HOSPITAL COURSE:  [name] is an 77 year old African-American male patient with extensive past medical history including recurrent small- bowel obstructions and recently hospitalized in November of last year for partial small-bowel obstruction, coronary artery disease, pacemaker implantation, AFib, on chronic Coumadin, hypertension, diet-controlled diabetes and cardiomyopathy, who presented with abdominal pain and vomiting for 2 days.  Evaluation in the emergency room was again suggestive of recurrent small-bowel obstruction.  Patient was thereby admitted for further management. 1. Recurrent partial small-bowel obstruction secondary to adhesions     from prior surgeries:  Patient was admitted to the hospital.  He     was made nil per oral.  An NG tube was placed to low wall suction.     He was hydrated with IV fluids.  His Coumadin was held.  Surgery     was consulted.  They managed him conservatively.  After the first 2     days, his NG tube was removed and patient was started on clear     liquids and his diet was slowly advanced.  Patient has tolerated     diet and has had BMs.  Surgery has cleared her for discharge. 2. Uncontrolled type 2 diabetes mellitus:  Patient has done well at     home on diet control and he can continue with the same. 3.  Atrial fibrillation with controlled ventricular rate:  Patient has     been on metoprolol alone in the hospital and his heart rates have     been mostly in the 60s to 80s and his blood pressure in the 120s to     130s over 70s to 80s.  His Cardizem is held, but may reconsider     starting it back when he sees his primary care physician in the     next 5 days.  Patient had not been on any Coumadin during this     hospitalization because of his GI issues and his INR was still     therapeutic yesterday.  We will resume at  lower dose and have him     repeat his INR when he sees his primary care physician on Jun 19, 2010 and adjust as deemed necessary. 4. Hypertension, controlled on Lopressor alone. 5. Anemia and thrombocytopenia, which are stable:  Again, he has had     fluctuating low hemoglobin as well as platelet counts in the past.     This may be a chronic issue, but recommend repeating CBCs when he     sees his primary MD the next 5 days. 6. Coronary artery disease history:  Patient is asymptomatic of any     chest pain. 7. History of pacemaker placement.  DISPOSITION:  Patient is discharged home in stable condition.  FOLLOWUP: 1. Patient is to see his primary care physician, Dr. Margaretmary Bayley on     the June 19, 2010 at 11:30 a.m. with blood test that is PT/INR     and CBC. 2. Followup with Dr. Darnell Level.  Patient is call to call for an     appointment.  Time taken in coordinating this discharge is 35 minutes.     Marcellus Scott, MD     AH/MEDQ  D:  06/14/2010  T:  06/14/2010  Job:  [id number]  cc:   Margaretmary Bayley, M.D. Fax: [phone]  Doylene Canning. Ladona Ridgel, MD [address]  [address] [place] [address]  Velora Heckler, MD [address] [place] [address]  Electronically Signed by Marcellus Scott MD on 06/25/2010 12:57:00 AM

## 2010-07-01 ENCOUNTER — Encounter (INDEPENDENT_AMBULATORY_CARE_PROVIDER_SITE_OTHER): Payer: Self-pay | Admitting: *Deleted

## 2010-07-11 NOTE — Cardiovascular Report (Signed)
Summary: Office Visit Remote   Office Visit Remote   Imported By: Roderic Ovens 07/03/2010 14:53:01  _____________________________________________________________________  External Attachment:    Type:   Image     Comment:   External Document

## 2010-07-11 NOTE — Letter (Signed)
Summary: Remote Device Check  [employer], Main Office  [address]   [place], [place] [address]   Phone: [phone]  Fax: [phone]     July 01, 2010 MRN: [id number]   [facility] [address] [place], [place]  [address]   Dear Mr. Betzold,   Your remote transmission was recieved and reviewed by your physician.  All diagnostics were within normal limits for you.  __X___Your next transmission is scheduled for:  09-13-2010.  Please transmit at any time this day.  If you have a wireless device your transmission will be sent automatically.   Sincerely,  Vella Kohler

## 2010-08-01 LAB — COMPREHENSIVE METABOLIC PANEL
ALT: 10 U/L (ref 0–53)
Calcium: 9.6 mg/dL (ref 8.4–10.5)
Creatinine, Ser: 1.22 mg/dL (ref 0.4–1.5)
GFR calc Af Amer: >60 mL/min (ref >60)
Glucose, Bld: 185 mg/dL — ABNORMAL HIGH (ref 70–99)
Sodium: 139 mEq/L (ref 135–145)
Total Protein: 8 g/dL (ref 6.0–8.3)

## 2010-08-01 LAB — URINALYSIS, ROUTINE W REFLEX MICROSCOPIC
Leukocytes, UA: NEGATIVE
Nitrite: NEGATIVE
Specific Gravity, Urine: 1.02 (ref 1.005–1.030)
Urobilinogen, UA: 0.2 mg/dL (ref 0.0–1.0)
pH: 5 (ref 5.0–8.0)

## 2010-08-01 LAB — GLUCOSE, CAPILLARY
Glucose-Capillary: 101 mg/dL — ABNORMAL HIGH (ref 70–99)
Glucose-Capillary: 102 mg/dL — ABNORMAL HIGH (ref 70–99)
Glucose-Capillary: 103 mg/dL — ABNORMAL HIGH (ref 70–99)
Glucose-Capillary: 105 mg/dL — ABNORMAL HIGH (ref 70–99)
Glucose-Capillary: 112 mg/dL — ABNORMAL HIGH (ref 70–99)
Glucose-Capillary: 132 mg/dL — ABNORMAL HIGH (ref 70–99)
Glucose-Capillary: 148 mg/dL — ABNORMAL HIGH (ref 70–99)
Glucose-Capillary: 168 mg/dL — ABNORMAL HIGH (ref 70–99)
Glucose-Capillary: 72 mg/dL (ref 70–99)
Glucose-Capillary: 84 mg/dL (ref 70–99)
Glucose-Capillary: 89 mg/dL (ref 70–99)
Glucose-Capillary: 92 mg/dL (ref 70–99)
Glucose-Capillary: 93 mg/dL (ref 70–99)

## 2010-08-01 LAB — DIFFERENTIAL
Eosinophils Absolute: 0 10*3/uL (ref 0.0–0.7)
Lymphocytes Relative: 6 % — ABNORMAL LOW (ref 12–46)
Lymphs Abs: 0.8 10*3/uL (ref 0.7–4.0)
Monocytes Relative: 2 % — ABNORMAL LOW (ref 3–12)
Neutrophils Relative %: 92 % — ABNORMAL HIGH (ref 43–77)

## 2010-08-01 LAB — BASIC METABOLIC PANEL
CO2: 21 mEq/L (ref 19–32)
Calcium: 8.5 mg/dL (ref 8.4–10.5)
GFR calc Af Amer: >60 mL/min (ref >60)
GFR calc Af Amer: >60 mL/min (ref >60)
GFR calc non Af Amer: >60 mL/min (ref >60)
GFR calc non Af Amer: >60 mL/min (ref >60)
Glucose, Bld: 100 mg/dL — ABNORMAL HIGH (ref 70–99)
Glucose, Bld: 83 mg/dL (ref 70–99)
Potassium: 3.9 mEq/L (ref 3.5–5.1)
Potassium: 4.1 mEq/L (ref 3.5–5.1)
Sodium: 140 mEq/L (ref 135–145)
Sodium: 142 mEq/L (ref 135–145)

## 2010-08-01 LAB — CBC
HCT: 37.6 % — ABNORMAL LOW (ref 39.0–52.0)
HCT: 41.8 % (ref 39.0–52.0)
Hemoglobin: 12.3 g/dL — ABNORMAL LOW (ref 13.0–17.0)
MCHC: 32.4 g/dL (ref 30.0–36.0)
MCHC: 32.7 g/dL (ref 30.0–36.0)
RBC: 4.78 MIL/uL (ref 4.22–5.81)
RDW: 17.2 % — ABNORMAL HIGH (ref 11.5–15.5)

## 2010-08-01 LAB — PROTIME-INR
INR: 1.97 — ABNORMAL HIGH (ref 0.00–1.49)
INR: 2.33 — ABNORMAL HIGH (ref 0.00–1.49)
INR: 2.47 — ABNORMAL HIGH (ref 0.00–1.49)
INR: 2.76 — ABNORMAL HIGH (ref 0.00–1.49)
Prothrombin Time: 25.7 seconds — ABNORMAL HIGH (ref 11.6–15.2)
Prothrombin Time: 26.9 seconds — ABNORMAL HIGH (ref 11.6–15.2)
Prothrombin Time: 29 seconds — ABNORMAL HIGH (ref 11.6–15.2)
Prothrombin Time: 29.3 seconds — ABNORMAL HIGH (ref 11.6–15.2)

## 2010-08-01 LAB — URINE MICROSCOPIC-ADD ON

## 2010-08-01 LAB — APTT: aPTT: 44 seconds — ABNORMAL HIGH (ref 24–37)

## 2010-08-01 LAB — HEMOGLOBIN A1C: Mean Plasma Glucose: 143 mg/dL — ABNORMAL HIGH (ref <117)

## 2010-08-01 LAB — PHOSPHORUS: Phosphorus: 2.3 mg/dL (ref 2.3–4.6)

## 2010-08-01 LAB — TSH: TSH: 0.811 u[IU]/mL (ref 0.350–4.500)

## 2010-08-01 LAB — URINE CULTURE

## 2010-08-01 LAB — MAGNESIUM: Magnesium: 1.8 mg/dL (ref 1.5–2.5)

## 2010-08-02 LAB — GLUCOSE, CAPILLARY
Glucose-Capillary: 116 mg/dL — ABNORMAL HIGH (ref 70–99)
Glucose-Capillary: 117 mg/dL — ABNORMAL HIGH (ref 70–99)
Glucose-Capillary: 119 mg/dL — ABNORMAL HIGH (ref 70–99)
Glucose-Capillary: 124 mg/dL — ABNORMAL HIGH (ref 70–99)
Glucose-Capillary: 133 mg/dL — ABNORMAL HIGH (ref 70–99)
Glucose-Capillary: 133 mg/dL — ABNORMAL HIGH (ref 70–99)
Glucose-Capillary: 148 mg/dL — ABNORMAL HIGH (ref 70–99)
Glucose-Capillary: 77 mg/dL (ref 70–99)
Glucose-Capillary: 79 mg/dL (ref 70–99)
Glucose-Capillary: 84 mg/dL (ref 70–99)
Glucose-Capillary: 89 mg/dL (ref 70–99)

## 2010-08-02 LAB — CBC
HCT: 36.4 % — ABNORMAL LOW (ref 39.0–52.0)
Hemoglobin: 12 g/dL — ABNORMAL LOW (ref 13.0–17.0)
Hemoglobin: 13.6 g/dL (ref 13.0–17.0)
MCH: 26 pg (ref 26.0–34.0)
MCH: 26.1 pg (ref 26.0–34.0)
MCHC: 32.9 g/dL (ref 30.0–36.0)
MCHC: 32.9 g/dL (ref 30.0–36.0)
MCHC: 33.3 g/dL (ref 30.0–36.0)
MCV: 78.2 fL (ref 78.0–100.0)
MCV: 78.7 fL (ref 78.0–100.0)
MCV: 79.2 fL (ref 78.0–100.0)
MCV: 79.3 fL (ref 78.0–100.0)
Platelets: 139 10*3/uL — ABNORMAL LOW (ref 150–400)
Platelets: 146 10*3/uL — ABNORMAL LOW (ref 150–400)
RBC: 4.51 MIL/uL (ref 4.22–5.81)
RBC: 5.22 MIL/uL (ref 4.22–5.81)
RDW: 16.9 % — ABNORMAL HIGH (ref 11.5–15.5)
RDW: 17 % — ABNORMAL HIGH (ref 11.5–15.5)
WBC: 6.7 10*3/uL (ref 4.0–10.5)
WBC: 7.7 10*3/uL (ref 4.0–10.5)

## 2010-08-02 LAB — BASIC METABOLIC PANEL
BUN: 7 mg/dL (ref 6–23)
CO2: 28 mEq/L (ref 19–32)
Calcium: 9.7 mg/dL (ref 8.4–10.5)
Chloride: 104 mEq/L (ref 96–112)
Chloride: 111 mEq/L (ref 96–112)
Creatinine, Ser: 1.09 mg/dL (ref 0.4–1.5)
Creatinine, Ser: 1.38 mg/dL (ref 0.4–1.5)
GFR calc Af Amer: 60 mL/min — ABNORMAL LOW (ref >60)
GFR calc non Af Amer: >60 mL/min (ref >60)
Glucose, Bld: 172 mg/dL — ABNORMAL HIGH (ref 70–99)
Sodium: 141 mEq/L (ref 135–145)

## 2010-08-02 LAB — DIFFERENTIAL
Basophils Relative: 0 % (ref 0–1)
Eosinophils Absolute: 0 10*3/uL (ref 0.0–0.7)
Eosinophils Absolute: 0 10*3/uL (ref 0.0–0.7)
Eosinophils Relative: 0 % (ref 0–5)
Lymphocytes Relative: 6 % — ABNORMAL LOW (ref 12–46)
Lymphs Abs: 0.5 10*3/uL — ABNORMAL LOW (ref 0.7–4.0)
Lymphs Abs: 0.7 10*3/uL (ref 0.7–4.0)
Monocytes Relative: 3 % (ref 3–12)
Neutro Abs: 7.4 10*3/uL (ref 1.7–7.7)
Neutrophils Relative %: 90 % — ABNORMAL HIGH (ref 43–77)

## 2010-08-02 LAB — COMPREHENSIVE METABOLIC PANEL
ALT: 10 U/L (ref 0–53)
AST: 25 U/L (ref 0–37)
Albumin: 3.5 g/dL (ref 3.5–5.2)
Alkaline Phosphatase: 60 U/L (ref 39–117)
BUN: 12 mg/dL (ref 6–23)
CO2: 26 mEq/L (ref 19–32)
Calcium: 8.5 mg/dL (ref 8.4–10.5)
Chloride: 112 mEq/L (ref 96–112)
Creatinine, Ser: 1.1 mg/dL (ref 0.4–1.5)
GFR calc Af Amer: >60 mL/min (ref >60)
GFR calc non Af Amer: >60 mL/min (ref >60)
Glucose, Bld: 137 mg/dL — ABNORMAL HIGH (ref 70–99)
Potassium: 4 mEq/L (ref 3.5–5.1)
Total Bilirubin: 0.9 mg/dL (ref 0.3–1.2)

## 2010-08-02 LAB — PROTIME-INR
INR: 3.29 — ABNORMAL HIGH (ref 0.00–1.49)
Prothrombin Time: 28.1 seconds — ABNORMAL HIGH (ref 11.6–15.2)
Prothrombin Time: 33.5 seconds — ABNORMAL HIGH (ref 11.6–15.2)

## 2010-08-02 LAB — MAGNESIUM: Magnesium: 2 mg/dL (ref 1.5–2.5)

## 2010-08-09 ENCOUNTER — Ambulatory Visit: Admission: RE | Admit: 2010-08-09 | Payer: MEDICARE | Source: Ambulatory Visit | Admitting: Radiation Oncology

## 2010-08-24 LAB — CBC
HCT: 35.3 % — ABNORMAL LOW (ref 39.0–52.0)
HCT: 37 % — ABNORMAL LOW (ref 39.0–52.0)
Hemoglobin: 11.5 g/dL — ABNORMAL LOW (ref 13.0–17.0)
Hemoglobin: 14.2 g/dL (ref 13.0–17.0)
MCHC: 31.9 g/dL (ref 30.0–36.0)
MCHC: 32.4 g/dL (ref 30.0–36.0)
MCHC: 32.7 g/dL (ref 30.0–36.0)
MCV: 80.6 fL (ref 78.0–100.0)
Platelets: 137 10*3/uL — ABNORMAL LOW (ref 150–400)
RBC: 4.47 MIL/uL (ref 4.22–5.81)
RBC: 5.53 MIL/uL (ref 4.22–5.81)
RDW: 17.2 % — ABNORMAL HIGH (ref 11.5–15.5)
RDW: 17.7 % — ABNORMAL HIGH (ref 11.5–15.5)
RDW: 17.7 % — ABNORMAL HIGH (ref 11.5–15.5)
WBC: 5.9 10*3/uL (ref 4.0–10.5)

## 2010-08-24 LAB — LIPASE, BLOOD: Lipase: 22 U/L (ref 11–59)

## 2010-08-24 LAB — DIFFERENTIAL
Basophils Absolute: 0 10*3/uL (ref 0.0–0.1)
Basophils Absolute: 0 10*3/uL (ref 0.0–0.1)
Eosinophils Absolute: 0 10*3/uL (ref 0.0–0.7)
Eosinophils Relative: 0 % (ref 0–5)
Lymphocytes Relative: 10 % — ABNORMAL LOW (ref 12–46)
Lymphocytes Relative: 10 % — ABNORMAL LOW (ref 12–46)
Monocytes Absolute: 0.8 10*3/uL (ref 0.1–1.0)
Neutro Abs: 7.6 10*3/uL (ref 1.7–7.7)

## 2010-08-24 LAB — COMPREHENSIVE METABOLIC PANEL
ALT: 12 U/L (ref 0–53)
AST: 30 U/L (ref 0–37)
Albumin: 4.5 g/dL (ref 3.5–5.2)
BUN: 19 mg/dL (ref 6–23)
CO2: 26 mEq/L (ref 19–32)
Chloride: 105 mEq/L (ref 96–112)
Chloride: 99 mEq/L (ref 96–112)
Creatinine, Ser: 1.48 mg/dL (ref 0.4–1.5)
Creatinine, Ser: 1.9 mg/dL — ABNORMAL HIGH (ref 0.4–1.5)
GFR calc Af Amer: 41 mL/min — ABNORMAL LOW (ref >60)
GFR calc Af Amer: 55 mL/min — ABNORMAL LOW (ref >60)
GFR calc non Af Amer: 34 mL/min — ABNORMAL LOW (ref >60)
GFR calc non Af Amer: 46 mL/min — ABNORMAL LOW (ref >60)
Potassium: 4.3 mEq/L (ref 3.5–5.1)
Sodium: 139 mEq/L (ref 135–145)
Total Bilirubin: 0.6 mg/dL (ref 0.3–1.2)
Total Bilirubin: 0.7 mg/dL (ref 0.3–1.2)

## 2010-08-24 LAB — URINE MICROSCOPIC-ADD ON

## 2010-08-24 LAB — GLUCOSE, CAPILLARY
Glucose-Capillary: 122 mg/dL — ABNORMAL HIGH (ref 70–99)
Glucose-Capillary: 127 mg/dL — ABNORMAL HIGH (ref 70–99)
Glucose-Capillary: 82 mg/dL (ref 70–99)
Glucose-Capillary: 90 mg/dL (ref 70–99)

## 2010-08-24 LAB — BASIC METABOLIC PANEL
BUN: 9 mg/dL (ref 6–23)
CO2: 25 mEq/L (ref 19–32)
CO2: 26 mEq/L (ref 19–32)
Chloride: 111 mEq/L (ref 96–112)
GFR calc non Af Amer: >60 mL/min (ref >60)
Glucose, Bld: 127 mg/dL — ABNORMAL HIGH (ref 70–99)
Glucose, Bld: 85 mg/dL (ref 70–99)
Potassium: 3.9 mEq/L (ref 3.5–5.1)
Potassium: 4 mEq/L (ref 3.5–5.1)
Sodium: 141 mEq/L (ref 135–145)

## 2010-08-24 LAB — URINALYSIS, ROUTINE W REFLEX MICROSCOPIC
Glucose, UA: NEGATIVE mg/dL
Hgb urine dipstick: NEGATIVE
Specific Gravity, Urine: 1.024 (ref 1.005–1.030)

## 2010-08-24 LAB — HEPATITIS PANEL, ACUTE: Hepatitis B Surface Ag: NEGATIVE

## 2010-08-24 LAB — PROTIME-INR
INR: 1.9 — ABNORMAL HIGH (ref 0.00–1.49)
INR: 2.2 — ABNORMAL HIGH (ref 0.00–1.49)
Prothrombin Time: 22 seconds — ABNORMAL HIGH (ref 11.6–15.2)

## 2010-08-24 LAB — APTT: aPTT: 33 seconds (ref 24–37)

## 2010-08-24 LAB — LACTIC ACID, PLASMA: Lactic Acid, Venous: 1.1 mmol/L (ref 0.5–2.2)

## 2010-09-13 ENCOUNTER — Encounter: Payer: Self-pay | Admitting: *Deleted

## 2010-09-16 ENCOUNTER — Encounter: Payer: Self-pay | Admitting: *Deleted

## 2010-09-17 ENCOUNTER — Other Ambulatory Visit: Payer: Self-pay | Admitting: Internal Medicine

## 2010-09-17 ENCOUNTER — Ambulatory Visit (INDEPENDENT_AMBULATORY_CARE_PROVIDER_SITE_OTHER): Payer: MEDICARE | Admitting: *Deleted

## 2010-09-17 DIAGNOSIS — I495 Sick sinus syndrome: Secondary | ICD-10-CM

## 2010-09-17 DIAGNOSIS — I4891 Unspecified atrial fibrillation: Secondary | ICD-10-CM

## 2010-09-27 NOTE — Progress Notes (Signed)
Pacer remote check  

## 2010-10-02 NOTE — Consult Note (Signed)
[name], [name] NO.:  [id number]   MEDICAL RECORD NO.:  [id number]          PATIENT TYPE:  INP   LOCATION:  4710                         FACILITY:  [facility]   PHYSICIAN:  Cherylynn Ridges, M.D.    DATE OF BIRTH:  1923/12/10   DATE OF CONSULTATION:  12/03/2007  DATE OF DISCHARGE:                                 CONSULTATION   REQUESTING PHYSICIAN:  Margaretmary Bayley, MD   CONSULTING SURGERY:  Cherylynn Ridges, MD   REASON FOR CONSULTATION:  Partial small bowel obstruction.   HISTORY OF PRESENT ILLNESS:  [name] is an 77 year old black male  with a history of small bowel obstruction for which he is status post  exploratory laparotomy with lysis of adhesions and small bowel resection  in 2006, diabetes mellitus, atrial fibrillation, and tachycardia-  bradycardia syndrome who presented to the emergency department on November 01, 2007, with a 3-week history of gas pains.  He states that these gas  pains have been progressive and on date of admission, he went to  [employer] earlier in the day and ate 2 corn dogs which caused an  increase in abdominal pain.  This also caused an increase in abdominal  distention and bloating and later that night developed nausea and  vomiting.  At that time, he presented to the emergency department and x-  rays were taken which showed a possible small bowel obstruction.  Also  on admission, his white blood cell count was 18,700.  A CT scan of the  abdomen and pelvis was taken the following day which showed moderate  proximal small bowel distention with relatively decompressed terminal  ileum and colon.  There is no focal transition point.  These findings  were suspicious for a low-grade partial small bowel obstruction.  Otherwise they were also 3 right paracentral ventral abdominal wall  hernias containing bowel but were not felt to be causing the  obstruction.  Since then, the patient has had an NG tube placed as well  as kept n.p.o.   At this time, due to a prior history of small bowel  obstruction requiring surgical intervention, we were consulted for this  partial small bowel obstruction.   REVIEW OF SYSTEMS:  See HPI.  Otherwise all other systems are currently  negative.   FAMILY HISTORY:  Noncontributory.   PAST MEDICAL HISTORY:  Significant for,  1. Atrial fibrillation.  2. Diabetes mellitus.  3. Prior history of small bowel obstruction requiring exploratory      laparotomy.   PAST SURGICAL HISTORY:  1. Laparoscopic converted to open cholecystectomy.  2. Exploratory laparotomy with lysis of adhesions and small bowel      resection in 2006.  3. Placement of pacemaker.   SOCIAL HISTORY:  The patient lives with his granddaughter.  He denies  any history of tobacco or alcohol abuse.   ALLERGIES:  I believe, NKDA.   MEDICATIONS:  1. Toprol-XL 25 mg daily.  2. Benicar 40 mg daily.  3. Cardizem 360 mg in the morning.  4. Coumadin 5 mg daily.   PHYSICAL EXAMINATION:  GENERAL:  This is a very pleasant, well-  developed, well-nourished 77 year old black male who is lying in bed  currently in no acute distress.  VITAL SIGNS:  Temperature 98.5, pulse 67, respirations 18, and blood  pressure 148/96.  EYES:  Sclerae are noninjected.  Pupils are equal, round, and reactive  to light.  EARS, NOSE, MOUTH, AND THROAT:  Ears and nose, no obvious masses or  lesions.  No rhinorrhea.  Mouth is pink and moist, and the patient  currently does not have any teeth.  Throat shows no exudate.  NECK:  Supple.  Trachea is midline.  No thyromegaly.  HEART:  Irregular with a normal S1 and S2 without murmurs, gallops, or  rubs; +2 Carotid pedal and radial pulses bilaterally.  LUNGS:  Clear to auscultation bilaterally with no wheezes, rhonchi, or  rales noted.  Respiratory effort is nonlabored.  ABDOMEN:  Soft, nontender, and nondistended with somewhat hypoactive  bowel sounds with 2 midline ventral hernias palpated.  These  are  extremely soft and reducible and are also nontender.  Otherwise of note,  the patient has a large right upper quadrant oblique scar as well as a  large midline incisional scar.  The patient does not have any peritoneal  signs, and there are no other obvious hernias or masses besides prior  ones noted.  MUSCULOSKELETAL:  All 4 extremities are symmetrical with no cyanosis,  clubbing, or edema.  SKIN:  Warm and dry with no obvious masses, lesions, or rashes.  NEUROLOGIC:  Cranial nerves II-XII appear to be grossly intact.  Deep  tendon reflex exam is deferred at this time.  PSYCHIATRIC:  The patient is alert and oriented x3 with an appropriate  affect.   LABORATORY AND DIAGNOSTIC DATA:  WBC today is 8900, hemoglobin is 12.5,  hematocrit 38.6, and platelet count is 151,000.  PT is 29.5, INR is 2.6.  Sodium 142, potassium 3.9, glucose 149, BUN 4, and creatinine 0.97.  All  LFTs are normal.  DIAGNOSTICS:  CT of the abdomen on December 02, 2007,  shows moderate proximal small bowel distention with a relatively  decompressed terminal ileum and colon with no focal transition point.  These findings are suspicious for low-grade partial small bowel  obstruction.  Also noted 2 right paracentral ventral abdominal wall  hernias containing bowel but not felt to the cause of the bowel  obstruction.  Abdominal film on December 03, 2007, showed contrast from the  patient's prior CT the day before to be in the colon.   IMPRESSION:  1. Partial small bowel obstruction which seems to be resolving.  2. Diabetes mellitus.  3. Atrial fibrillation.   PLAN:  At this time, we recommend continuation of the patient's  nasogastric tube; however, the patient does appear to be improving base  on his abdominal x-rays today which showed contrast in his colon.  He  also states that he began passing some gas yesterday.  If this continues  until tomorrow, then we may try a trial of clamping his NG tube prior to   discontinuing this.  Also while the NG tube is still in, we would  recommend continuation of n.p.o. status.  However, if he begins to  experience these same symptoms as before, he may need surgical  intervention.  If that is the case, then his INR will need to be  decreased to approximately 1.5 instead of elevated at 2.6.  Otherwise at  this  time, the patient's ventral wall hernias are stable, soft,  and  reducible.  As stated earlier, partial small bowel obstruction seems to  be improving and at this time, there are no acute surgical needs.  However, we will follow the patient along with you.  Thank you for this  consult.      Letha Cape, PA      Cherylynn Ridges, M.D.  Electronically Signed    KEO/MEDQ  D:  12/03/2007  T:  12/04/2007  Job:  [id number]   cc:   Margaretmary Bayley, M.D.

## 2010-10-02 NOTE — Assessment & Plan Note (Signed)
[facility]                         ELECTROPHYSIOLOGY OFFICE NOTE   Turhan, Chill BALTASAR TWILLEY                     MRN:          [id number]  DATE:04/19/2008                            DOB:          Feb 20, 1924    Mr. Sevey returns today for followup.  He is a very pleasant elderly  male with a history of symptomatic bradycardia and tachy-brady syndrome  status post pacemaker insertion.  He has a history of diabetes and  atrial fibrillation.  He returns today for followup.  Note that he was  hospitalized back in July with bowel obstruction, but he has  subsequently improved and this is resolved.  He notes that he has an  episode of epistaxis particularly in his left naris, this has resolved.  The patient had on his own accord cut back on his Coumadin therapy.   His medicines today include Benicar 40 a day, Toprol 25 a day, Coumadin  as directed, Prevacid 30 a day, Cardizem LA 360 a day?   PHYSICAL EXAMINATION:  GENERAL:  On exam, he is a pleasant somewhat  discomfort-appearing elderly man in no distress.  VITAL SIGNS:  Blood pressure was 156/72, pulse was 74 and regular,  respirations were 18, weight was 177 pounds.  NECK:  Revealed no jugular venous distention.  LUNGS:  Clear bilaterally to auscultation.  No wheezes, rales, or  rhonchi were present.  CARDIAC:  Irregular rate and rhythm.  Normal S1 and S2.  The PMI was not  enlarged.  Lead was placed.  ABDOMEN:  Soft, nontender.  EXTREMITIES:  Demonstrated no edema.   Interrogation of his pacemaker demonstrates a Medtronic in rhythm.  Defibrillation waves were a millivolt, the R waves 20 millivolts, the  impedance 520 in the atrium, 472 in the right ventricle, threshold volt  of 0.4 in the right ventricle.  Battery voltage was 2.96 volts.  55% V  pacing.   IMPRESSION:  1. Symptomatic tachycardia-bradycardia syndrome.  2. Paroxysmal/persistent atrial fibrillation.  3. Status post pacemaker  insertion.  4. Hypertension.   DISCUSSION:  Mr. Saha is stable, and his pacemaker is working  normally.  He is at a rhythm, but is basically asymptomatic.  He will  continue on his current medical therapy.  We will see him back in the  office in 1 year.    Doylene Canning. Ladona Ridgel, MD  Electronically Signed   GWT/MedQ  DD: 04/19/2008  DT: 04/20/2008  Job #: [id number]   cc:   Ricki Rodriguez, M.D.

## 2010-10-02 NOTE — Assessment & Plan Note (Signed)
[facility]                         ELECTROPHYSIOLOGY OFFICE NOTE   [name], [name]                     MRN:          [id number]  DATE:04/07/2007                            DOB:          08-03-1923    [name] returns today for followup.  He is a very pleasant elderly  man with symptomatic paroxysmal A-fib and now chronic A-fib and  hypertension and symptomatic bradycardia.  He is status post dual-  chamber pacemaker insertion.  He returns today for followup.  He denies  chest pain.  He denies shortness of breath.  He denies peripheral edema.  He has rare palpitations.   MEDICATIONS INCLUDE:  1. Benicar 40 a day.  2. Toprol XL 25 a day.  3. Coumadin as directed.  4. Cardizem 360 mg daily.  5. Prevacid.   ON EXAM:  He is a pleasant, well-appearing, elderly man, in no distress.  Blood pressure today was 150/90, the pulse 65 and regular, respirations  were 18 and the weight was 180 pounds.  NECK:  Revealed no jugular venous distention.  LUNGS:  Clear bilaterally to auscultation.  No wheezes, rales or rhonchi  are present.  There is no increased work of breathing.  CARDIOVASCULAR EXAM:  Revealed an irregular rate and rhythm with normal  S1 and S2.  EXTREMITIES:  Demonstrated no edema.   Interrogation of his pacemaker demonstrates a Medtronic EnRhythm.  The  fibrillation waves were 1 millivolt, the R-waves were 20, the impedance  536 in the atrium, 488 in the ventricle.  The threshold was 1 volt at  0.4 in the right ventricle.  Battery voltage was 2.97 volts.  He was 55%  V-paced.  He is, of course, on chronic Coumadin therapy.   IMPRESSION:  1. Symptomatic tachy/brady.  2. Chronic A-fib.  3. Status post pacemaker insertion.  4. Hypertension.   DISCUSSION:  Overall, patient is stable and his pacemaker is working  normally.  We will see him back in the office in one year.    Doylene Canning. Ladona Ridgel, MD  Electronically Signed   GWT/MedQ   DD: 04/07/2007  DT: 04/08/2007  Job #: [id number]   cc:   Ricki Rodriguez, M.D.

## 2010-10-05 NOTE — Op Note (Signed)
[place]  Patient:    EFREN, KROSS Visit Number: [id number] MRN: [id number]          Service Type: DSU Location: [phone] Attending Physician:  Fortino Sic Dictated by:   Marnee Spring. Wiliam Ke, M.D. Proc. Date: 03/31/01 Admit Date:  03/31/2001   CC:         Lindell Spar. Chestine Spore, M.D.  Ricki Rodriguez, M.D.   Operative Report  PREOPERATIVE DIAGNOSIS:  Symptomatic gallstones.  POSTOPERATIVE DIAGNOSIS:  Symptomatic gallstones.  PROCEDURE PERFORMED:  Laparoscopy and open cholecystectomy with operative cholangiogram.  SURGEON:  Marnee Spring. Wiliam Ke, M.D.  ASSISTANT:  ______ .  ANESTHESIA:  Endotracheal by hospital.  DESCRIPTION OF PROCEDURE:  Under good endotracheal anesthesia, the abdomen was prepped and draped in the usual manner.  After an incision was made above the umbilicus, there was a long midline wound.  The abdomen was entered but we were unable, even after placing the Hasson, of obtaining pneumoperitoneum to get above the adhesions between the omentum and the anterior abdominal wall. Approximately 10 minutes of effort was placed and we were sure that we could not accomplish this.  At this point, the Hasson cannula was removed.  A pursestring suture was tied.  The abdomen was entered through a right subcostal incision.  The muscles were divided.  Hemostasis was good.  The gallbladder was identified and pulled up into the wound.  It was dissected free from its attachment to the liver until the cystic artery and cystic duct were identified.  The cystic artery was triply clipped and cut leaving two clips proximally.  The cystic duct was clipped on the gallbladder side and the cystic duct was opened.  It was quite difficult to obtain a cholangiogram, but a cholangiogram was finally obtained using a torque catheter and a fluoroscopic technique.  This was essentially normal.  The cystic duct was then triply clipped and cut leaving two clips  proximally.  The right upper quadrant was copiously irrigated with a saline and sucked dry.  The abdomen was then closed.  The peritoneum was closed with running #1 PDS and the anterior sheath was closed with running #1 PDS.  The wound was irrigated with saline and all the wound were then closed with stainless steel staples. Estimated blood loss was 50 cc.  The patient received no blood.  He left the operating room in stable condition after sponge and unit counts verified. Dictated by:   Marnee Spring. Wiliam Ke, M.D. Attending Physician:  Fortino Sic DD:  03/31/01 TD:  03/31/01 Job: [id number]

## 2010-10-05 NOTE — Discharge Summary (Signed)
[name], [name]              ACCOUNT NO.:  [id number]   MEDICAL RECORD NO.:  [id number]          PATIENT TYPE:  INP   LOCATION:  0357                         FACILITY:  [facility]   PHYSICIAN:  Anselm Pancoast. Weatherly, M.D.DATE OF BIRTH:  14-Jul-1923   DATE OF ADMISSION:  09/15/2004  DATE OF DISCHARGE:  09/28/2004                                 DISCHARGE SUMMARY   DISCHARGING DIAGNOSES:  1.  Small bowel obstruction with segment of obstructed small bowel secondary      to incarcerated incisional hernia.  2.  History of chronic atrial fibrillation and sinus bradycardia, no      atrioventricular bundle block.   OPERATIONS:  1.  Exploratory laparotomy, resection of small bowel, and hopefully repair      of incisional hernia.  2.  Placement of pacemaker.   HISTORY:  Rishav Rockefeller is a 77 year old male patient of Margaretmary Bayley,  M.D., who came to the ejection fraction with abdominal pain and distention  for an approximately 24-hour duration.  Stated her has a hiatus repair may  years ago by Dr. Mosetta Anis, and then an open cholecystectomy through a  right subcostal incision about two years ago by Dr. Joanne Gavel.  He is  followed by Dr. Chestine Spore.  He is on chronic Coumadin because of a history of  atrial fibrillation, and when he presented to the ER, it appears that he  probably has sick sinus syndrome or significant bradycardia when first seen.  His x-rays showed a mass in the small bowel above stomach and the small  intestine, with very little gas within his colon.  It looks like he has a  small bowel obstruction.  NG tube was placed in the emergency room, with  about basically 3 canisters of succus entericus being withdrawn.  The  patient is on Coumadin and was given 2 units of FFP and 2 mg of  AquaMEPHYTON, and Dr. Shana Chute, who was on call for Dr. Chestine Spore, came in to see  him.  He will need surgery, but of course his posterior tibial has to be  corrected, and he was given a fluid bolus.   His white count was elevated at  18,000.  His prothrombin time was 16.6 seconds in the emergency room, and it  was approximately 1:00 when I was asked to see him.  He received the 2 units  of FFP and was added to the O.R. schedule for the following morning and  taken to surgery.  Dr. Leonie Man assisted, and he was taken to surgery,  where the midline incision was opened, and he was found to have a knuckle of  bowel caught up within the fascia defect really between the subcostal  incision and the midline incision, and there was a definite segment of  ischemic small bowel that was resected, and a functional side-to-side  anastomosis performed.  We hope we repaired the fascia defect so that it  will held.  No mesh was used because of the small bowel resection, and then  postoperatively he went to the ICU.  Had runs of tachycardia and then also  runs of bradycardia, and his urine output was marginal, and fluid boluses  were given for the low urine output.  This appeared to be all related to the  small bowel obstruction, and cardiology was also followed along with him,  and was described as sort of a tachy-bradycardia atrial fibrillation.  The  patient had a moderate amount of NG drainage for approximately two days, and  then the NG drainage dropped significantly, and the nasogastric tube was  removed on Sep 21, 2004.  We started him on a clear liquid diet, and also  started him on his Coumadin, and we have been given him Lovenox after the  first postoperative day because of his history of atrial fibrillation.  Cardiology evaluated him further. and after cardiac ultrasound, a pacemaker  was found to be needed, and this was performed on Sep 26, 2004.  His  abdominal incision appears to be healing satisfactorily, and his diet had  been advanced, and he was ready for discharge in improved condition on Sep 28, 2004.  His staples  were removed, and the wound was Steri-Stripped.  He will be  followed by his  regular medical doctor for Coumadin check in approximately three days, and  continues on his regular chronic medications.  He will be seen in [facility] for pacemaker followup and Coumadin check.      WJW/MEDQ  D:  10/10/2004  T:  10/11/2004  Job:  [id number]   cc:   Anselm Pancoast. Zachery Dakins, M.D.  [address], [address]  [place]  [place] [address]   Osvaldo Shipper. Spruill, M.D.  [address] [address]  [place]  [place] [address]  Fax: [phone]   Margaretmary Bayley, M.D.  [address], [address]  [place]  [place] [address]  Fax: [phone]

## 2010-10-05 NOTE — Discharge Summary (Signed)
[place]  Patient:    RYUN, VELEZ Visit Number: [id number] MRN: [id number]          Service Type: SUR Location: 3700 [id number] Attending Physician:  Fortino Sic Dictated by:   Marnee Spring. Wiliam Ke, M.D. Admit Date:  04/04/2001 Discharge Date: 04/07/2001                             Discharge Summary  ADMISSION DIAGNOSES: 1. Symptomatic cholelithiasis. 2. Coronary artery disease.  DISCHARGE DIAGNOSES: 1. Symptomatic cholelithiasis. 2. Coronary artery disease.  OPERATION PERFORMED:  Open cholecystectomy March 31, 2001.  ADMISSION DATA:  This is a 77 year old male with symptomatic cholelithiasis, a recent history of non-Q-wave myocardial infarction with congestive heart failure, intermittent atrial fibrillation, hypertension, and sinoatrial node dysfunction and diabetes mellitus type 2.  Admission laboratory and x-ray data were confirmatory of these diagnoses.  There was no sign of abnormal liver function.  HOSPITAL COURSE:  The patient was admitted postoperatively.  He was stable, although slightly bloated. He was put under the care of Ricki Rodriguez, M.D., who handled his cardiac rhythm.  By April 02, 2001, he was eating well and tolerating a diet. By April 03, 2001, he was okay for discharge and Dr. Algie Coffer took care of his discharge medications.  CONDITION ON DISCHARGE:  Stable.  DIET:  As tolerated.  ACTIVITY:  Ambulation as tolerated.  DISCHARGE MEDICATIONS: Per Dr. Algie Coffer.  FOLLOW-UP:  He will follow up to see me and Dr. Algie Coffer in two weeks. Dictated by:   Marnee Spring. Wiliam Ke, M.D. Attending Physician:  Fortino Sic DD:  06/15/01 TD:  06/15/01 Job: [id number]

## 2010-10-05 NOTE — Cardiovascular Report (Signed)
[place]  Patient:    [name], [name] Visit Number: [id number] MRN: [id number]          Service Type: MED Location: 3W 0364 01 Attending Physician:  Virgia Land Dictated by:   Ricki Rodriguez, M.D. Proc. Date: 01/29/01 Admit Date:  01/26/2001   CC:         Lindell Spar. Chestine Spore, M.D.   Cardiac Catheterization  REFERRING PHYSICIAN:  Lindell Spar. Chestine Spore, M.D.  PROCEDURES PERFORMED:  Left heart catheterization, selective coronary angiography, and left ventricular function study.  INDICATIONS:  This 77 year old black male had abnormal cardiac enzymes and EKG, along with the cardiac risk factors of hypertension and diabetes.  The patient is awaiting to have abdominal surgery.  APPROACH:  Right femoral artery using 6-French diagnostic catheters.  COMPLICATIONS:  None.  HEMODYNAMIC DATA:  The left ventricular pressure was 135/21.  The aortic pressure was 134/65.  Perclose suture was applied without any difficulty.  CORONARY ANATOMY: Left main coronary artery:  The left main coronary artery was unremarkable.  Left anterior descending coronary artery:  The left anterior descending coronary artery had mid vessel 30% disease, then a diffuse narrowing and somewhat slow filling.  The diagonal vessel had mild ostial disease.  Left circumflex coronary artery:  The left circumflex coronary artery had 20% ostial disease and 20-30% mid vessel disease with very slow filling of obtuse marginal branch #1, followed by 100% distal disease of obtuse marginal branch #1.  Obtuse marginal branch #2 was a large vessel and unremarkable.  Right coronary artery:  The right coronary artery was dominant and had minimal luminal irregularities.  LEFT VENTRICULOGRAM:  The left ventriculogram was hyperdynamic with ejection fraction of 70-75%.  IMPRESSIONS: 1. Significant obtuse marginal branch #1 disease, consistent with a small    myocardial infarction and  looked like a non-Q wave myocardial infarction. 2. Mild left anterior descending and right coronary artery disease. 3. Normal left ventricular systolic function.  RECOMMENDATIONS:  This patient will be treated medically.  He will continue aspirin, Plavix, and Lovenox until he goes for abdominal surgery. Dictated by:   Ricki Rodriguez, M.D. Attending Physician:  Virgia Land DD:  01/29/01 TD:  01/29/01 Job: [id number]

## 2010-10-05 NOTE — H&P (Signed)
[place]. [facility]  Patient:    [name], [name] Visit Number: [id number] MRN: [id number]          Service Type: Attending:  Marnee Spring. Wiliam Ke, M.D. Dictated by:   Marnee Spring. Wiliam Ke, M.D. Adm. Date:  03/31/01                           History and Physical  CHIEF COMPLAINT:  Abdominal pain.  HISTORY OF PRESENT ILLNESS:  This is a 77 year old male who was admitted to the hospital approximately six weeks ago with a non Q-wave myocardial infarction, congestive heart failure and symptomatic gallstones.  He was hospitalized for several days and has been relatively well since.  PAST MEDICAL HISTORY:  Coronary artery disease.  PAST SURGICAL HISTORY:  Hiatal hernia repair ten years ago and hemorrhoid surgery.  MEDICATIONS:  Protonix, isosorbide, Toprol, warfarin and Nexium.  ALLERGIES:  None.  REVIEW OF SYSTEMS:  Chronic atrial fibrillation, no recent history of congestive heart failure or signs or symptoms of chest pain.  PHYSICAL EXAMINATION:  VITAL SIGNS:  Temperature 98, pulse 80, respirations 16.  GENERAL:  Well-developed, well-nourished, no distress.  HEENT:  Cranium normocephalic.  Eyes, ears, nose and throat are normal.  NECK:  Supple.  CHEST:  Clear.  HEART:  Slightly irregular with no thrills, murmurs or gallops.  ABDOMEN:  Soft and flat with a long midline incision and general giving way of the abdominal wall.  RECTAL:  Examination is deferred.  EXTREMITIES AND NEUROLOGICAL:  Essentially within normal limits.  IMPRESSION:  Symptomatic gallstones in a patient with known coronary artery disease status post non Q-wave myocardial infarction and chronic atrial fibrillation. Dictated by:   Marnee Spring. Wiliam Ke, M.D. Attending:  Marnee Spring. Wiliam Ke, M.D. DD:  03/31/01 TD:  03/31/01 Job: [id number]

## 2010-10-05 NOTE — H&P (Signed)
NAMESHAI, MCKENZIE              ACCOUNT NO.:  [id number]   MEDICAL RECORD NO.:  [id number]          PATIENT TYPE:  EMS   LOCATION:  ED                           FACILITY:  [facility]   PHYSICIAN:  Anselm Pancoast. Weatherly, M.D.DATE OF BIRTH:  1923-08-22   DATE OF ADMISSION:  09/15/2004  DATE OF DISCHARGE:                                HISTORY & PHYSICAL   HISTORY:  [name] is a 77 year old male who is a patient of [name] and came to the emergency room with abdominal distention, vomiting.  Says that he started having significant pain later this evening.  He has  been somewhat bloated and has a past history of having a hiatal hernia  repair years ago by Dr. Mosetta Anis and then he had an open  cholecystectomy about two years ago or three years ago by Dr. Joanne Gavel.  He  is followed by Dr. Margaretmary Bayley.  He is on chronic Coumadin because of a  history, we think, of atrial fibrillation and appears to have maybe a sick  sinus syndrome or bradycardia when he was first seen.  X-rays showed  basically a massive small bowel dilatation of both stomach and small bowel,  not really much dilated colon and this certainly looks as if it is small  bowel in origin.  On abdominal exam he was very distended but fortunately  not acutely tender in any quadrant.  Do not appreciate any umbilical or  groin hernias.  The nurse was attempting to put an NG tube and not having  much success and I assisted and after about an hour with the patient's  cooperation, we finally got it in to the stomach.  It appears that he is  pretty difficult to go through the hiatal hernia and then it promptly filled  up a canister initially and the tube going into the stomach and the patient  said he felt better afterwards.  His cardiac doctor, Dr. Shana Chute, came in to  see him because his pulse was approximately __________, but with the  stimulation from putting in the NG tube, his pulse went up to in the 40's  and  Dr. Shana Chute was of the opinion that this is probably kind of a sick  sinus syndrome, but says his cardiac catheterization previously was not all  that bad.  I do not really get a history of definite angina.  As far as on  the Coumadin that he is on, the patient states that he was placed on it  after is gallbladder surgery and Dr. Shana Chute is of the opinion that it is  probably related to atrial fibrillation as best he can tell.  The patient,  after the NG tube was in, was feeling better and we will continue with NG  suction.  We will give him 10 mg of AquaMEPHYTON since he is anticoagulated  and it certainly looks from his x-rays that he is going to need a laparotomy  for a bowel obstruction.  I do not appreciate any acute surgical findings on  abdominal exam but his white count is worse at  about 18,000.  His  electrolytes are good.  His hematocrit is good.  We are going to place him  on Unasyn and recheck a flat and upright abdominal x-ray in the morning and  also repeat his coag studies in the morning.  I suspect if we end up having  to operate on him, we probably will end up having to give him fresh frozen  plasma but his prothrombin time was 16.6 seconds tonight.  We hope it will  correct pretty nicely.  He does not have any history of anything that would  actually need heparin intravenously at this time.   REVIEW OF SYSTEMS:  Dr. Margaretmary Bayley is his regular physician.   MEDICATIONS:  Protonix, Toprol, Coumadin, Nexium.   PAST MEDICAL HISTORY:  1.  He has history of atrial fibrillation and Dr. Wiliam Ke, when he had him in,      was talking about congestive heart failure, but the patient has no pedal      edema at this time.  His cardiac medications now are Cardizem 360 mg      daily, nitroglycerin patch, Benicar for lipids.  2.  He is a type 2 diabetic but he is not on any sugar medications.   PHYSICAL EXAMINATION:  GENERAL:  Elderly male, appears his stated age.  Was  a little older.   Was kind of cooperative with the difficult placement of his  NG tube.  Oriented to time and place.  VITAL SIGNS:  Blood pressure 127/36, heart rate 58 right now, temperature  97.  Earlier he had a heart rate reported at 58, but then he has had a heart  rate of 40, so his pulse is quite variable.  LUNGS:  Good breath sounds bilaterally.  ABDOMEN:  Kind of decreased bowel sounds.  No definite masses.  Do not  appreciate hernias.  He has really no acute peritoneal signs in any quadrant  of the abdomen.   PLAN:  We will suck on him overnight.  Replace his fluids. Reverse his  anticoagulation and x-ray him in the morning unless there is definite  improvement.  He will probably need an exploratory laparotomy.  The  patient's family is aware of this as is the patient.  I am going to place  him on Unasyn because of his elevated white count even though clinically he  does not appear to actually have acute surgical abdomen, but just extremely  distended.      WJW/MEDQ  D:  09/16/2004  T:  09/16/2004  Job:  [id number]

## 2010-10-05 NOTE — Assessment & Plan Note (Signed)
[facility]                           ELECTROPHYSIOLOGY OFFICE NOTE   [name], [name] ANTE ARREDONDO                     MRN:          [id number]  DATE:04/08/2006                            DOB:          08/25/1923    Mr. Perrow returns today for followup.  He is a very pleasant elderly man  with symptomatic tachybrady syndrome and paroxysmal a-fib, who is status  post pacemaker insertion.  He returns today for followup.  He overall is  doing well.  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   PHYSICAL EXAMINATION:  GENERAL:  On exam, he is a pleasant, well-appearing  elderly man in no distress.  VITAL SIGNS:  Blood pressure 150/60.  The pulse was 70 and regular.  Respirations are 18.  The weight was 181 pounds.  NECK:  Revealed no jugular venous distention.  LUNGS:  Clear bilaterally to auscultation.  CARDIOVASCULAR:  Regular rate and rhythm and normal S1 and S2.  EXTREMITIES:  Demonstrate no cyanosis, clubbing or edema.   Interrogation of the pacemaker demonstrates a [employer]onic in rhythm with  defibrillation waves of 1-1/2 and a pacing impedence of 464 ohms.  The R-  waves are 20.  The pacing impedence was 424 ohms and a threshold volt of 0.4  msec, battery voltage 2.99 volts.   IMPRESSION:  1. Symptomatic tachybrady syndrome.  2. Hypertension.  3. Chronic Coumadin therapy.   DISCUSSION:  Overall, Mr. Moure is stable.  His pacemaker is working  normally, and his heart rate is well controlled in a-fib.  Will plan to see  him back in the office in 1 year.     Doylene Canning. Ladona Ridgel, MD  Electronically Signed    GWT/MedQ  DD: 04/08/2006  DT: 04/08/2006  Job #: [id number]   cc:   Ricki Rodriguez, M.D.

## 2010-10-05 NOTE — Op Note (Signed)
[name], [name]              ACCOUNT NO.:  [id number]   MEDICAL RECORD NO.:  [id number]          PATIENT TYPE:  INP   LOCATION:  0157                         FACILITY:  [facility]   PHYSICIAN:  Anselm Pancoast. Weatherly, M.D.DATE OF BIRTH:  [birth year]   DATE OF PROCEDURE:  09/16/2004  DATE OF DISCHARGE:                                 OPERATIVE REPORT   PREOPERATIVE DIAGNOSIS:  Small bowel obstruction.   POSTOPERATIVE DIAGNOSIS:  Small bowel obstruction secondary to incarcerated  ventral incisional hernia with a short area of intestinal ischemia.   OPERATION:  1.  Exploratory laparotomy and lysis of adhesions.  2.  Small bowel resection with functional end-to-end anastomosis.  3.  Repair of incisional hernia.   ANESTHESIA:  General.   SURGEON:  Dr. Consuello Bossier   ASSISTANT:  Dr. Leonie Man   HISTORY:  [name] is a 77 year old male, who was admitted  approximately 1 a.m. with abdominal pain, the patient states of only about  24 hours' duration.  He is a hypertensive patient.  Has a sick sinus  syndrome.  Said he had a colon exam with what sounded like a flexible  sigmoid by Dr. Chestine Spore this past week but started having significant pain on  Saturday, was brought by his family members to the emergency room late  Saturday evening or early Saturday night, seen by Dr. Ethelda Chick, and I was  called about midnight with patient having a markedly distended small bowel.  He has had a previous hiatus hernia repair, and he has also had an open  cholecystectomy by Dr. Wiliam Ke about 2002 and denies any kind of chronic GI  symptoms until yesterday.  He said he had had a bowel movement earlier in  the day, then became nauseated and vomited several times and on examination  in the emergency room, he was markedly distended.  I suggested they place a  nasogastric tube, as I was finishing up on an appendectomy, and I went over  and saw him shortly afterwards, and the NG tube was difficult  to get in.  I  was able to finally get it into the stomach, and then we filled two  canisters of small bowel contents very promptly.  The patient is on Coumadin  because of a history of previous atrial fibrillation.  His prothrombin time  was about 1.  INR was about 1.6, and I gave him 200 mg of __________ in a  fluid bolus.  We got him in step-down, and this morning repeat x-rays were  obtained.  The portable were basically you could not tell anything and then  got an acute abdomen series, and the NG tube was coiled in the stomach.  The  small bowel was still markedly dilated.  His white count was about 18,000 at  midnight, and this morning it was down, but it was still significantly  tender and clinically obstructed.  We gave him 2 units of fresh frozen  plasma this morning, as his prothrombin time was still about the same and  scheduled him for OR approximately 1:00.   We started him on Unasyn.  He has PAS stockings.  Dr. Shana Chute has seen him.  He is also kind of a type 2 diabetic even though he is not any type of  diabetic medications.  The patient was taken to the operative suite,  induction of general anesthesia, endotracheal tube.  Foley catheter was  inserted sterilely, and then the abdomen was prepped with Betadine surgical  scrub and solution and draped in a sterile manner.  A small incision was  made right at the umbilicus.  He has some keloids and upon entering the old  midline incision, there were a couple of little fascial defects that were  carefully freed up.  It appeared that there was a loop of bowel to the right  of the umbilicus or above the umbilicus and where he had had the subcostal  incision in the midline, that little area, there was a definite little  hernia or a loop of bowel that had caught in it.  The omentum was very  adherent.  This has obviously been a chronic little fascial defect, but the  bowel itself was extremely hyperemic and very congested.  We  continued kind  of carefully opening the incision and then extended it inferiorly so that we  could bring the very dilated loops of small bowel and then trying to figure  how it was actually twisted.  It appears that it has twisted around this  loop of small bowel that was incarcerated, and we freed it up and then could  bring the dilated loops of bowel up above the fascia level.  The area that  was so densely incarcerated obviously is going to need to be resected, and  the small bowel was so dilated we elected to do a little side-to-side  functional anastomosis with a GIA and a TA60 but as the bowel was opened,  used to pull and suck out just large quantities of __________.  What we  found was a lot of corn that obviously the patient has had recently that was  actually what had blocked this little chronic hernia.  The little small  mesenteric defect was closed, and the bowel was now in anatomical position  and after the area was untwisted, the bowel pinked up significantly, and  there was really the only little short area that required resection.  The  omentum that was incarcerated in this little area was just basically freed  up so I could suture the posterior fascia and the anterior fascia to the  right of the umbilicus as a single layer so I could close this defect with  interrupted #1 Novofil sutures.  The omentum is under this.  The small bowel  is back in good anatomical position.  We had thoroughly irrigated with  liters of fluid, and then things were returning essentially clear.  A few  little sutures were loosely approximated with staples on the skin, and the  NG tube, he has such a chronic scarring in the upper abdomen, it is good  position by x-ray and also functions well.  I could not actually feel it in  the stomach, and I did not free up the area where he has this previous  hiatus hernia repair.  A fish had been used to hold the bowel up while we were placing all the sutures,  and this was removed as the last two sutures  were tied.  The patient tolerated the procedure satisfactory.  Had a  satisfactory urine output.  He was a  little hypertensive, and he will go to  the recovery room then back to the ICU.  He has an NG tube and continue him  on the Unasyn.  It will be at least 2-3 days before he can safely remove the  nasogastric tube.  He said that after his hiatus hernia repair, he got  extremely distended, and we want to make sure that he is having good bowel  function before the NG tube is removed.      WJW/MEDQ  D:  09/16/2004  T:  09/16/2004  Job:  [id number]   cc:   Osvaldo Shipper. Spruill, M.D.  [address] [address]  [place]  [place] [address]  Fax: [phone]   Margaretmary Bayley, M.D.  [address], [address]  [place]  [place] [address]  Fax: [phone]

## 2010-10-05 NOTE — Consult Note (Signed)
[facility]  Patient:    BRETON, BERNS Visit Number: [id number] MRN: [id number]          Service Type: MED Location: 3W [phone] Attending Physician:  Virgia Land Dictated by:   Lorayne Marek, Proc. Date: 01/28/01 Admit Date:  01/26/2001   CC:         Dr. Chestine Spore   Consultation Report  DATE OF BIRTH:  March 01, 1925  ACCOUNT NUMBER:  [id number]  HISTORY OF PRESENT ILLNESS:  The patient is a 77 year old African-American male with a past medical history significant for paraesophageal hiatal hernia, gastroesophageal reflux, who presented yesterday to [facility] Emergency Department after three days of abdominal pain.  The patient reports pain started after eating on Saturday night.  The patient describes feeling like he had gas.  The pain was sharp and knife-like.  The patient reports he took Epsom Salts with no relief.  The patient describes the pain as constant and escalating.  He was able to sleep off and on through the night.  The patient reports the pain was focused in the epigastric region, and somewhat in the right upper quadrant.  The patient describes no radiation.  The patient said the pain was associated with nausea, but no vomiting.  There was no change in level of pain with position.  The patient reported decreased appetite, yet was able to drink fluids and soup without increase in symptoms or vomiting.  The patient denies fevers, chills, diarrhea, constipation, melena, hematochezia, or emesis.  The patient went to [facility] Emergency Department the day after the pain started.  The patient describes the pain has now escalated to a level of 10/10.  The patient reports that he had multiple tests at [facility]. Still was nauseous, yet still not vomiting.  The patient describes getting pain medication.  Was sent home that night with some relief to his abdominal pain.  The patient describes pain still  being a 4/10.  At [facility] the patient had a CAT scan that revealed a paraesophageal hernia, yet no acute pancreatitis.  There was sludge versus stone in the gallbladder.  There was no free fluid or air noted on the CAT scan.  The patient also had abdominal x-ray which revealed no free air or ileus.  The patient reports by Monday, pain had recurred and was increasing.  At that point the patient presented to the [facility] Emergency Department.  At that time the patient had elevated alkaline phosphatase, AST, ALT, total bilirubin, and blood pressure. At [facility] all these labs were within normal limits.  The patients course so far has been complicated by atrial fibrillation, atrial flutter.  During his hospital stay at [facility] he was given Demerol for pain control, Phenergan for nausea.  At this time, the patient is pain-free.  PAST MEDICAL HISTORY: 1. Type 2 diabetic, diet controlled. 2. Hypertension. 3. History of syncope. 4. Paraesophageal hernia, status post repair in 1990. 5. Hemorrhoidectomy. 6. Question transurethral resection of prostate in 1987.  ALLERGIES:  No known drug allergies.  HOME MEDICATIONS: 1. Cardizem CD 240 mg. 2. Nexium 40 mg q.d. 3. Ecotrin q.d. 4. Question of Toprol 50 q.d.  HOSPITAL MEDICATIONS: 1. Catapres patch one every week. 2. Toprol XL 50 mg q.d. 3. Imdur 30 mg q.d. 4. Lasix 40 mg b.i.d. 5. Protonix 40 mg q.d. 6. Demerol p.r.n. 7. Digoxin 0.125 mg q.d. 8. Aldactone 25 mg b.i.d.  SOCIAL HISTORY:  The patient lives with his wife, works as a Sports coach, at [employer].  Does not drink alcohol.  Quit smoking in 1956.  Smoked 1-1/2 packs for several years.  Has no drug history.  FAMILY HISTORY:  Mother is deceased, but overall was healthy.  Father is deceased secondary to pneumonia.  Brother passed away of cancer, type unknown.  PHYSICAL EXAMINATION:  GENERAL:  This is a pleasant elderly male in  no acute distress.  HEENT:  Normocephalic, atraumatic.  Pupils are equal, round and reactive to light.  Extraocular movements intact.  The patient is dentulous.  Oral mucosa is pink, no exudate, no lymphadenopathy, no JVD.  RESPIRATORY:  Clear to auscultation bilaterally.  CARDIAC:  The patient has regular rate and rhythm at this time with a 2/6 systolic ejection murmur at the left sternal border.  GASTROINTESTINAL:  Positive bowel sounds, soft, nontender, nondistended, positive tympany, no hepatosplenomegaly, no Murphys sign, no guarding, no rebound, no shifting.  EXTREMITIES:  No clubbing, cyanosis, or edema.  NEUROLOGIC:  Awake, alert and oriented x 3.  Cranial nerves II-XII grossly intact.  LABORATORY DATA:  Sodium 139, potassium 3.7, chloride 106, bicarbonate 28, BUN 14, creatinine 1.2, glucose 134.  TSH 0.381, low normal.  T4 8.2, within normal limits.  Troponin has trended from 0.05 to 0.07 to 0.13.  CKs have been elevated at 1467 to 1154 to 930, with negative indexes.  White blood cell count as of January 27, 2001, was 11.2, hemoglobin 12.7, platelets 159. Lipase trended from 28 to 18, amylase has trended from a high of 203 to 111. Total bilirubin is 2.3 to 5.2 to now 3.2.  SGOT was normal on January 25, 2001, then elevated to 500 on January 26, 2001, now trending down to 112. SGPT normal on January 25, 2001, elevated to 269 on January 26, 2001, now trending down to 120.  RADIOLOGY:  Hepatobiliary liver function tests on January 27, 2001, revealed hepato ______ disease with abnormal ongoing concentration of trace in liver, patent cystic, and common bile duct.  Chest x-ray revealed cardiomegaly and vascular congestion on January 27, 2001.  Abdominal x-rays on January 27, 2001, revealed large hiatal hernia with no acute abnormalities.  CAT scan on January 28, 2001, revealed no evidence of pulmonary embolism, positive for  emphysematous changes, and  pericholecystic fluid inflammation.  Ultrasound done on January 26, 2001, was nondiagnostic.  There was a question of visualization of the gallbladder versus a fluid filled bowel, if this was the gallbladder it had a thickened wall and possible sludge.  CAT scan on January 25, 2001, revealed paraesophageal hernia, no acute pancreatitis, and sludge versus stone in the gallbladder.  There is no free fluid with a normal CAT scan of the pelvis.  X-rays of the abdomen on January 25, 2001, showed no free air.  ASSESSMENT:  This is a 77 year old African-American male who appears to have biliary colic on Sunday presentation to [facility] Emergency Department.  This is consistent with the patient having normal labs at that time.  It is believed that on Monday the patient passed stone to the common bile duct, thus causing elevation of his enzymes.  PLAN: 1. The patient needs cholecystectomy with operative cholangiogram. 2. If cholangiogram is abnormal, the patient will need endoscopic retrograde  cholangiopancreatography.  Discussed with Dr. Sherin Quarry. Dictated by:   Lorayne Marek, Attending Physician:  Virgia Land DD:  01/28/01 TD:  01/28/01 Job: [id number]

## 2010-10-05 NOTE — Discharge Summary (Signed)
[name], [name]              ACCOUNT NO.:  [id number]   MEDICAL RECORD NO.:  [id number]          PATIENT TYPE:  INP   LOCATION:  4710                         FACILITY:  [facility]   PHYSICIAN:  Margaretmary Bayley, M.D.    DATE OF BIRTH:  July 11, 1923   DATE OF ADMISSION:  12/01/2007  DATE OF DISCHARGE:  12/07/2007                               DISCHARGE SUMMARY   DISCHARGE DIAGNOSES:  1. Small bowel obstruction.  2. Systemic hypertension.  3. Type 2 insulin-dependent diabetes mellitus.  4. Three ventral hernia.  5. Atrial fibrillation, chronic status post cardiac pacemaker.   REASON FOR ADMISSION:  This is one of several [facility]  hospitalizations for [name].  An 77 year old, hypertensive, type 2  diabetic with arteriosclerotic heart disease, and coronary artery  disease who presented to the emergency room with a chief complaint of  less than 24 hours of periumbilical abdominal pain, nausea, and  vomiting.  The patient's family members stated that he had complained of  some abdominal gas and distention for about a week prior to this  admission and had taken a laxative and felt that this was related only  to some constipation.  Several hours prior to admission, he apparently  consumed 2 corn dogs at eating out in one of the [employer] and subsequently within an hour or 2 developed, severe  periumbilical pain, which was progressive in nature.  He had no  associated fever or chills.  No hematochezia, melena, or hematemesis.  The patient was brought into the emergency room where he was evaluated  by the staff and was found to be quite tender in the mid upper abdomen  and periumbilical region.  As part of his workup, a CT scan of the  abdomen was obtained and revealed changes consistent with a partial  small bowel obstruction.  The patient was subsequently given a NG  decompression and was admitted for further observation and treatment.   PERTINENT PHYSICAL  FINDINGS:  GENERAL:  He is an elderly gentleman,  appearing his stated age.  VITAL SIGNS:  Blood pressure 162/88 with pulse rate of 96, respiratory  rate of 20, and temperature was 98.8.  HEENT:  He had no scleral icterus, no conjunctival pallor.  Pharynx is  clear.  NG tube was in place.  NECK:  Supple.  No adenopathy or jugular venous distention or thyroid  enlargement.  LUNGS:  Clear to percussion and auscultation.  CARDIAC:  He had a regular rate and rhythm.  No gallops and no rubs  noted.  ABDOMEN:  Soft with multiple surgical scars.  He had minimal bowel  sounds.  There were no obvious masses.  EXTREMITIES:  There is no clubbing, cyanosis, or edema.  No calf  tenderness.  NEUROLOGIC:  He was alert, oriented, cooperative.   CT scan of his abdomen revealed possible small bowel distention, felt to  be consistent with lower small bowel obstruction.   HOSPITAL COURSE:  The patient, as noted above, had a NG tube inserted  without complications and over the course of the first 36 hours, he was  made n.p.o. and his NG tube was set to above low pressure suction.  His  NG returned a bilious but without any blood.  The patient was seen in  consultation by Dr. Jimmye Norman of the General Surgery Service.  It was  his opinion that the patient had partial small bowel obstruction and  that there was no indication for any surgical intervention.  The patient  was treated conservatively with NG decompression and with sustained  relief and absence of abdominal pain.  On the third hospital day, his NG  tube was clamped aggressively without development of any significant  abdominal distention or abdominal pain.  The patient was noted to pass  gas at about the 48-hour period time.  On fourth hospital day, he was  able to tolerate clear liquid meals.  This was rapidly advanced to a  regular diet by the fifth hospital day.  The patient developed or  experienced no other complications with clearance  from General Surgery.  The patient subsequently discharged home in a condition that was  significantly improved.  He was instructed to continue Benicar at 40 mg  per day, Cardizem 360 a day, Toprol 25 mg per day, Coumadin 5 mg daily  except Sundays .  He is scheduled to be seen back in the office in 2  weeks, and he was instructed to resume his Coumadin dose between 4 and  6:00 p.m. each day as he had been doing in the past.  He is discharged  home on a 3 gram sodium, no concentrated sweets, modified fat restricted  diet.      Margaretmary Bayley, M.D.  Electronically Signed     PC/MEDQ  D:  01/21/2008  T:  01/22/2008  Job:  [id number]
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
## 2010-10-05 NOTE — Consult Note (Signed)
[name], [name]              ACCOUNT NO.:  [id number]   MEDICAL RECORD NO.:  [id number]          PATIENT TYPE:  INP   LOCATION:  0357                         [facility]AC[facility]   PHYSICIAN:  [name], M.D.  DATE OF BIRTH:  March 28, 1924   DATE OF CONSULTATION:  09/26/2004  DATE OF DISCHARGE:                                   CONSULTATION   REASON FOR CONSULTATION:  [name] is referred for evaluation by Dr.  Algie Craig and Dr. Shana Craig for consideration for permanent pacemaker insertion  for the tachybrady syndrome.   HISTORY OF PRESENT ILLNESS:  The patient is a 77 year old man with a history  of persistent atrial fibrillation on Coumadin therapy who presented to the  hospital 12 days ago with a distended abdomen and was found to have an  incarcerated incisional hernia and small-bowel obstruction. He underwent  exploratory laparotomy. He was found to be bradycardic with heart rates in  the low 30s at times, as well as with heart rates in atrial fibrillation in  the 110-120 range. The patient also has fairly significant conduction  disease with a P-R interval of 340 milliseconds. The patient subsequently  underwent exploratory laparotomy and his postoperative course was been  unremarkable except for problems with persistent tachybrady syndrome. He is  now referred for consideration for permanent pacemaker insertion.   PAST MEDICAL HISTORY:  Notable for nonobstructive coronary disease with  normal LV systolic function in the past. He has a history of hypertension.  He has a history of diabetes for over 25 years.   PAST MEDICAL HISTORY:  Notable for the patient living alone in [place].  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 He quit smoking cigarettes in 1956.  He denies alcohol abuse.   FAMILY HISTORY:  Notable for mother died of unknown causes and the father  dying of pneumonia. He had a brother who died of cancer.   REVIEW OF SYSTEMS:  Notable for weight loss since  his surgery. He denies  fevers, chills, or night sweats; adenopathy; headache; vision or hearing  problems; cough or hemoptysis. He does have palpitations and presyncope at  times. He does have two-pillow orthopnea. He denies claudication or cough or  hemoptysis. He denies dysuria or hematuria. He does have nocturia x2-3. He  denies weakness, numbness, or depression or anxiety problems. He denies  nausea, vomiting, diarrhea or constipation, or abdominal pain. He denies  polyuria, polydipsia, heat or cold intolerance. Please note he did have  abdominal pain prior to his hernia repair. He denies arthralgias, arthritis,  or joint pains. The other review of systems was negative.   PHYSICAL EXAMINATION:  GENERAL:  He is a pleasant, well-appearing 77-year-  old man in no distress.  VITAL SIGNS:  His blood pressure was 144/77, the pulse was 64 and irregular,  the respirations were 18, temperature was 99.  HEENT:  Normocephalic and atraumatic. The pupils were equal and round, the  oropharynx was moist, sclerae anicteric.  NECK:  Revealed no jugular venous distention. There was no thyromegaly.  Trachea was midline. Carotids were 1+ and symmetric.  LUNGS:  Clear bilaterally with no wheezes, rales, or rhonchi. There was no  increased work of breathing.  CARDIOVASCULAR:  Irregular rhythm with normal S1 and S2.  ABDOMEN:  Soft, nontender. Bowel sounds are present. There is a well-healed  incisional scar.  EXTREMITIES:  Demonstrated no cyanosis, clubbing, or edema. The pulses were  1+ and symmetric throughout.  NEUROLOGIC:  Alert and oriented x3 with cranial nerves II-XII intact. The  strength was 5/5 and symmetric. He was alert and oriented to person, place,  and time.   EKG demonstrates sinus bradycardia with first degree A-V block as well as  atrial fibrillation with a rapid ventricular response.   IMPRESSION:  1.  Paroxysmal/persistent atrial fibrillation.  2.  Tachybrady syndrome.  3.   Sinus node dysfunction.   DISCUSSION:  I have discussed treatment options with the patient. The risks,  benefits, goals, and expectations of permanent pacemaker insertion have been  discussed with the patient and he wishes to proceed.      GWT/MEDQ  D:  09/26/2004  T:  09/26/2004  Job:  [id number]   cc:   [name], M.D.  [address], [address]  [place]  [place] [address]  Fax: [phone]

## 2010-10-05 NOTE — Op Note (Signed)
[name], [name]              ACCOUNT NO.:  [id number]   MEDICAL RECORD NO.:  [id number]          PATIENT TYPE:  OUT   LOCATION:  CATH                         FACILITY:  [facility]   PHYSICIAN:  Doylene Canning. Ladona Ridgel, M.D.  DATE OF BIRTH:  1923/08/09   DATE OF PROCEDURE:  09/26/2004  DATE OF DISCHARGE:                                 OPERATIVE REPORT   PROCEDURE PERFORMED:  Implantation of dual-chamber pacemaker.   INDICATIONS:  Symptomatic tachy-brady syndrome with sinus node dysfunction.   INTRODUCTION:  The patient is a 77 year old man who was admitted to hospital  with abdominal pain and found to have incarcerated hernia. He was  subsequently found to be severely bradycardic at times and other times in  atrial fibrillation with rapid ventricular response. Because of bradycardia  in the 30s as well as atrial fibrillation with rates in the 120s, he is  referred for permanent pacemaker implantation.   PROCEDURE:  After informed consent was obtained, the patient taken to the  diagnostic EP lab in the fasting state. After the usual preparation and  draping, intravenous fentanyl and Midazolam were given for sedation. 30 cc  lidocaine was infiltrated in the left infraclavicular region. 5 cm incision  was carried out over this region. Electrocautery utilized to dissect down to  the fascial plane. 10 cc of contrast was injected into the left upper  extremity venous system demonstrating a patent left subclavian vein.  It was  subsequently punctured x 2 and the Medtronic model 5076 58 cm active  fixation pacing lead serial number [id number] was advanced to the right  ventricle and a Medtronic model 5076 52 cm active fixation pacing lead  serial number [id number] was advanced to the right atrium. Mapping was  carried out in the right ventricle and on the RV septum.  The R-waves  measured greater than 30 mV. The pacing threshold 0.6 volts, 0.5  milliseconds in the pacing impedance of 845 ohms.  The lead actively fixed.  10 V pacing did not stimulate the diaphragm.  With the RV lead in  satisfactory position, attention was turned to placement of the atrial lead.  It was placed in the right atrial appendage where fibrillation waves  measured between 2 and 3 mV. Pacing impedance was 536 with lead actively  fixed with pacing in the AOO mode. Again 10 V pacing did not stimulate the  diaphragm. With both atrial and RV leads in satisfactory position, they were  secured to the subpectoralis fascia with a figure-of-eight silk suture. The  sewing sleeve was secured with silk suture. Electrocautery was utilized to  make a subcutaneous pocket. Kanamycin irrigation was utilized to irrigate  the pocket. Electrocautery was utilized to assure hemostasis. The Medtronic  Enrhythm, [id number] dual-chamber pacemaker serial number [id number] was  connected to the atrial and ventricular pacing leads and placed in the  subcutaneous pocket.  The generator was secured with silk suture. Additional  kanamycin was utilized to irrigate the pocket. The incision was then closed  with layer of 2-0 Vicryl followed by layer of 3-0 Vicryl followed by a layer  of 4-0 Vicryl.  Benzoin was painted on the skin. Steri-Strips were applied  and a pressure dressing was placed. The patient was returned to his room in  satisfactory condition.   COMPLICATIONS:  There were no immediate procedure complications.   RESULTS:  This demonstrates successful implantation of a Medtronic dual-  chamber pacemaker in a patient with symptomatic tachy-brady syndrome.      GWT/MEDQ  D:  09/26/2004  T:  09/26/2004  Job:  [id number]   cc:   Margaretmary Bayley, M.D.  [address]  [place]  [place] [address]  Fax: [phone]   Osvaldo Shipper. Spruill, M.D.  [address] [address]  [place]  [place] [address]  Fax: [phone]

## 2010-10-05 NOTE — Discharge Summary (Signed)
. [facility]  Patient:    [name], [name] Visit Number: [id number] MRN: [id number]          Service Type: SUR Location: [id number] Attending Physician:  Fortino Sic Dictated by:   Marnee Spring. Wiliam Ke, M.D. Admit Date:  04/04/2001 Discharge Date: 04/07/2001                             Discharge Summary  ADMISSION DIAGNOSIS:  Postoperative ileus.  DISCHARGE DIAGNOSIS:  Postoperative ileus.  OPERATION PERFORMED:  None.  ADMITTING DATA:  This is a 77 year old male admitted two days after being discharged for open cholecystectomy.  Other diagnoses include coronary artery disease with atrial fibrillation, diabetes mellitus, and gastroesophageal reflux.  ADMITTING LABORATORY AND X-RAY DATA:  Suggests partial small bowel obstruction and atrial fibrillation with rapid rate.  HOSPITAL COURSE:  The patient was admitted.  A nasogastric tube was inserted. IV fluids were started.  He began to have bowel movements.  HIs cardiac status was evaluated and treated by Dr. Algie Coffer.  On the second hospital day, he had a large bowel movement, he began eating, and on the day of discharge, he was up and about tolerating a diet and having bowel movements.  His discharge condition was stable, diet as tolerated, and ambulation was no lifting or calisthenics.  DISCHARGE MEDICATIONS:  Cardiac medications by Dr. Algie Coffer.  FOLLOWUP:  See Dr. Algie Coffer several days after discharge and see me two weeks after discharge. Dictated by:   Marnee Spring. Wiliam Ke, M.D. Attending Physician:  Fortino Sic DD:  06/25/01 TD:  06/27/01 Job: [id number]

## 2010-10-05 NOTE — Discharge Summary (Signed)
[facility]  Patient:    [name], [name] Visit Number: [id number] MRN: [id number]          Service Type: MED Location: *N Attending Physician:  Ricki Rodriguez Dictated by:   Lindell Spar. Chestine Spore, M.D. Adm. Date:  01/26/01 Disc. Date: 02/03/01                             Discharge Summary  DISCHARGE DIAGNOSES: 1. Acute subendocardial myocardial infarction. 2. Atrial fibrillation/flutter. 3. Acute cholecystitis. 4. Cholelithiasis. 5. Coronary artery disease. 6. Chronic obstructive pulmonary disease. 7. Type 2 insulin independent diabetes mellitus. 8. Gastroesophageal reflux disease.  REASON FOR ADMISSION:  Mr. Beale is a 77 year old type 2 diabetic gentleman who is brought into the emergency room by family members with a chief complaint of severe abdominal pain localized to the epigastric region.  The patient had been seen several days prior to that in the [facility] Emergency Room with similar complaints and a workup at that time included a CT scan which revealed a paraesophageal hernia but no acute pancreatitis.  It was felt that there was some gallbladder sludge versus a stone.  There were no acute changes on his abdominal x-rays and he was treated symptomatically and discharged with instructions to follow up with me in several days.  Prior to his office visit, the patient developed a recurrence of his epigastric pain, nausea, vomiting, and was brought into the ER, where he was noted to be hypertensive with a blood pressure of 210/114, a low-grade fever of 99.5, epigastric tenderness, nausea, vomiting, and is admitted.  PERTINENT PHYSICAL FINDINGS:  GENERAL:  He is a well-developed, well-nourished gentleman without acute distress but appearing quite apprehensive.  VITAL SIGNS:  Blood pressure 142/88, pulse rate 100, respiratory rate 24, and temperature 99.3.  HEENT:  There was no sclera icterus and no conjunctival pallor.  NECK:  Supple.   No jugular venous distention, no carotid bruits.  BREASTS:  He had some mild gynecomastia.  CHEST:  No splinting, tenderness, or deformities.  His lungs were clear to percussion and auscultation.  He did have slight increase and resonance but no focal dullness or decrease in breath sounds.  CARDIAC:  He had a rapid and, at this time, a regular rhythm.  No gallops, no rubs noted.  ABDOMEN:  There were several upper abdominal well-healed surgical scars and keloids related to previous hiatal hernia repair.  There was a mild to moderate amount of epigastric tenderness but no rebound.  Bowel sounds were present.  PERTINENT LABORATORY DATA:  EKG revealed a sinus tachycardia with a rate of 120.  There were anterolateral repolarization abnormalities but no acute ST or T wave changes.  White count was high at 12,700 with a shift to the left, hematocrit of 45. SGOT of 500 with an alkaline phosphatase elevation of 179 and SGPT of 269. Bilirubin of 3.2.  His CPK-MB and troponin levels were unremarkable at 3.2 and 0.5, respectively, and amylase was 18.  His arterial blood gas on room air: PO2 of 70 with a PCO2 of 32 and a pH of 7.49.  His chest x-ray revealed cardiomegaly with pulmonary vascular congestion.  HOSPITAL COURSE:  The patient is admitted to a telemetry bed with a working diagnosis of acute cholecystitis with enzyme changes, consistent with a common bile duct obstruction.  Subsequent serial EKGs and cardiac enzymes were requested and his GI workup included an ultrasound of a gallbladder  which was felt not to show definite stones.  A hepatobiliary scan on the third hospital day revealed changes consistent with hepatic parenchymal pathology but there was no evidence of cystic or common bile duct obstruction.  With his elevated amylases, transaminases, and with his systemic vascular congestion, it was felt that it would be important to exclude pulmonary embolization and a spiral CT  scan of the chest was obtained but did not reveal any evidence of a pulmonary embolus.  There was, however, some evidence suggesting fluid and an inflammatory process around the gallbladder.  Subsequent SGOT, SGPT, and alkaline phosphatase levels slowly returned to normal over the next several days.  The patient was seen in consultation by Dr. Algie Coffer, who felt that the serial enzyme changes were consistent with a small subendocardial myocardial infarction.  The patient had several episodes of flutter/fibrillation and was started on systemic anticoagulation with Lovenox at 30 mg subcu every 12 hours.  A GI and general surgical consult were obtained.  Dr. Joanne Gavel saw the patient and felt that his findings were consistent and compatible with biliary colic; however, in view of his subendocardial myocardial infarction, it was felt that any surgery should be postponed and rescheduled after an appropriate time per Dr. Algie Coffer.  The remainder of his hospital course was unremarkable.  He had no further bouts of abdominal pain, nausea, vomiting, and his transaminases and other liver studies slowly returned to normal.  The patient did have, as noted above, episodes of atrial flutter/fibrillation, as well as periods of significant bradycardia with high-grade AV conduction delay and it was felt that these changes were not related to his Toprol but was most likely indicative of sinoatrial disease.  The patient was coumadinized.  DISCHARGE MEDICATIONS: 1. Toprol XL 12.5 mg every 12 hours. 2. Imdur 30 mg once a day. 3. Coumadin 2 mg once a day. 4. Ecotrin 81 mg per day. 5. Protonix 40 mg per day. 6. He had no significant problems with his glucose tolerance during this    hospital stay and was not discharged on any hypoglycemic agent.  The last    fasting blood sugar obtained on the day of his discharge was 113.  His    bilirubin was back to normal at 0.7.  DISCHARGE CONDITION:  Much improved.   Prognosis is still guarded, in view of  the gallstones that remain and the possibility of recurrent biliary colic and bouts of acute cholecystitis.  DIET:  He is discharged on a nonconcentrated sweets, no added salt diet.  FOLLOW-UP:  He will be seen in my office in one week for a repeat INR and he will be seen by Dr. Algie Coffer in two weeks and an appropriate referral will be made to Dr. Wiliam Ke for a cholecystectomy after being followed up in the office. Dictated by:   Lindell Spar. Chestine Spore, M.D. Attending Physician:  Ricki Rodriguez DD:  03/19/01 TD:  03/19/01 Job: [id number]

## 2010-10-07 ENCOUNTER — Encounter: Payer: Self-pay | Admitting: *Deleted

## 2010-12-20 ENCOUNTER — Encounter: Payer: MEDICARE | Admitting: *Deleted

## 2010-12-25 ENCOUNTER — Encounter: Payer: Self-pay | Admitting: *Deleted

## 2010-12-28 ENCOUNTER — Ambulatory Visit (INDEPENDENT_AMBULATORY_CARE_PROVIDER_SITE_OTHER): Payer: Medicare Other | Admitting: *Deleted

## 2010-12-28 DIAGNOSIS — I495 Sick sinus syndrome: Secondary | ICD-10-CM

## 2010-12-28 DIAGNOSIS — I4891 Unspecified atrial fibrillation: Secondary | ICD-10-CM

## 2010-12-28 DIAGNOSIS — Z950 Presence of cardiac pacemaker: Secondary | ICD-10-CM

## 2010-12-29 ENCOUNTER — Other Ambulatory Visit: Payer: Self-pay | Admitting: Internal Medicine

## 2011-01-04 LAB — REMOTE PACEMAKER DEVICE
AL IMPEDENCE PM: 488 Ohm
BATTERY VOLTAGE: 2.89 V
BMOD-0002RV: 7
BMOD-0003RV: 30
RV LEAD IMPEDENCE PM: 440 Ohm

## 2011-01-08 NOTE — Progress Notes (Signed)
Pacer remote check  

## 2011-01-30 ENCOUNTER — Encounter: Payer: Self-pay | Admitting: *Deleted

## 2011-02-14 LAB — DIFFERENTIAL
Basophils Absolute: 0
Basophils Relative: 0
Eosinophils Absolute: 0.1
Monocytes Absolute: 0.6
Monocytes Relative: 3
Neutro Abs: 17.2 — ABNORMAL HIGH
Neutrophils Relative %: 92 — ABNORMAL HIGH

## 2011-02-14 LAB — POCT I-STAT, CHEM 8
BUN: 13
Chloride: 106
Creatinine, Ser: 1.2
Potassium: 3.1 — ABNORMAL LOW
Sodium: 141

## 2011-02-14 LAB — CBC
Hemoglobin: 12.9 — ABNORMAL LOW
MCHC: 33.2
MCV: 78.2
RDW: 16.6 — ABNORMAL HIGH

## 2011-02-14 LAB — HEPATIC FUNCTION PANEL
AST: 31
Bilirubin, Direct: 0.1
Total Bilirubin: 0.6

## 2011-02-14 LAB — LIPASE, BLOOD: Lipase: 27

## 2011-02-14 LAB — URINALYSIS, ROUTINE W REFLEX MICROSCOPIC
Glucose, UA: 100 — AB
Leukocytes, UA: NEGATIVE
Protein, ur: 30 — AB
Specific Gravity, Urine: 1.02
pH: 5

## 2011-02-14 LAB — URINE MICROSCOPIC-ADD ON

## 2011-02-15 LAB — COMPREHENSIVE METABOLIC PANEL
ALT: 8
Calcium: 8.7
Glucose, Bld: 149 — ABNORMAL HIGH
Sodium: 142
Total Protein: 6.1

## 2011-02-15 LAB — DIFFERENTIAL
Basophils Absolute: 0.1
Basophils Relative: 1
Basophils Relative: 1
Eosinophils Absolute: 0.1
Eosinophils Absolute: 0.2
Eosinophils Relative: 3
Monocytes Absolute: 0.6
Monocytes Absolute: 0.7
Monocytes Relative: 8
Neutro Abs: 3.7
Neutro Abs: 6.3

## 2011-02-15 LAB — CBC
HCT: 34.7 — ABNORMAL LOW
Hemoglobin: 11.2 — ABNORMAL LOW
Hemoglobin: 11.6 — ABNORMAL LOW
Hemoglobin: 12.6 — ABNORMAL LOW
MCHC: 32.6
MCHC: 33
MCV: 77.9 — ABNORMAL LOW
MCV: 78.1
RBC: 4.43
RBC: 4.91
RBC: 4.95
RDW: 16.2 — ABNORMAL HIGH
RDW: 16.5 — ABNORMAL HIGH
WBC: 8.9

## 2011-02-15 LAB — RENAL FUNCTION PANEL
BUN: 4 — ABNORMAL LOW
CO2: 24
Calcium: 8.9
Chloride: 108
Creatinine, Ser: 1.07
GFR calc Af Amer: >60
Glucose, Bld: 108 — ABNORMAL HIGH

## 2011-02-15 LAB — LIPID PANEL
HDL: 41
Total CHOL/HDL Ratio: 2.9

## 2011-02-15 LAB — PSA: PSA: 6.13 — ABNORMAL HIGH

## 2011-02-15 LAB — TSH: TSH: 1.295

## 2011-02-15 LAB — BASIC METABOLIC PANEL
CO2: 22
CO2: 23
Calcium: 8.3 — ABNORMAL LOW
Chloride: 110
Creatinine, Ser: 0.98
GFR calc Af Amer: >60
GFR calc Af Amer: >60
GFR calc non Af Amer: >60
Sodium: 139

## 2011-02-15 LAB — APTT: aPTT: 39 — ABNORMAL HIGH

## 2011-02-15 LAB — PROTIME-INR: Prothrombin Time: 29.5 — ABNORMAL HIGH

## 2011-02-15 LAB — FREE PSA: PSA, Free: 0.8

## 2011-02-15 LAB — T3, FREE: T3, Free: 2.5 (ref 2.3–4.2)

## 2011-04-18 ENCOUNTER — Encounter: Payer: Self-pay | Admitting: Internal Medicine

## 2011-04-18 ENCOUNTER — Ambulatory Visit (INDEPENDENT_AMBULATORY_CARE_PROVIDER_SITE_OTHER): Payer: Medicare Other | Admitting: Internal Medicine

## 2011-04-18 DIAGNOSIS — Z950 Presence of cardiac pacemaker: Secondary | ICD-10-CM

## 2011-04-18 DIAGNOSIS — I495 Sick sinus syndrome: Secondary | ICD-10-CM

## 2011-04-18 DIAGNOSIS — I4891 Unspecified atrial fibrillation: Secondary | ICD-10-CM

## 2011-04-18 LAB — PACEMAKER DEVICE OBSERVATION
AL IMPEDENCE PM: 456 Ohm
BATTERY VOLTAGE: 2.88 V
BMOD-0002RV: 7
BRDY-0004RV: 130 {beats}/min
RV LEAD IMPEDENCE PM: 424 Ohm
VENTRICULAR PACING PM: 51.87

## 2011-04-18 NOTE — Patient Instructions (Signed)
Your physician wants you to follow-up in: 6 months with Dr Court Joy will receive a reminder letter in the mail two months in advance. If you don't receive a letter, please call our office to schedule the follow-up appointment.   Remote monitoring is used to monitor your Pacemaker of ICD from home. This monitoring reduces the number of office visits required to check your device to one time per year. It allows [place] to keep an eye on the functioning of your device to ensure it is working properly. You are scheduled for a device check from home on 07/18/11 You may send your transmission at any time that day. If you have a wireless device, the transmission will be sent automatically. After your physician reviews your transmission, you will receive a postcard with your next transmission date.

## 2011-04-18 NOTE — Assessment & Plan Note (Signed)
His device is working normally. We'll plan to recheck in several months. 

## 2011-04-18 NOTE — Progress Notes (Signed)
HPI [name] returns today for followup. He has a history of symptomatic bradycardia status post permanent pacemaker insertion. Previously he had paroxysmal atrial fibrillation which has now become permanent. Despite this the patient has continued to do well. He remains active and denies chest pain or shortness of breath. He has had no syncope. He has very minimal peripheral edema. No Known Allergies   Current Outpatient Prescriptions  Medication Sig Dispense Refill  . diltiazem (CARDIZEM LA) 240 MG 24 hr tablet Take 240 mg by mouth daily.        . metoprolol tartrate (LOPRESSOR) 25 MG tablet Take 25 mg by mouth 2 (two) times daily.        Marland Kitchen olmesartan-hydrochlorothiazide (BENICAR HCT) 40-12.5 MG per tablet Take 1 tablet by mouth daily.        . Tamsulosin HCl (FLOMAX) 0.4 MG CAPS Take 0.4 mg by mouth at bedtime.           Past Medical History  Diagnosis Date  . Tachycardia-bradycardia syndrome   . Diabetes mellitus   . Atrial fibrillation     ROS:   All systems reviewed and negative except as noted in the HPI.   Past Surgical History  Procedure Date  . Exploratory laparotomy   . Laparoscopic cholecystectomy 2006    with lysis of adhesions   . Small intestine surgery 2006  . Hemorrhoid surgery   . Hiatal hernia repair 2002     Family History  Problem Relation Age of Onset  . Pneumonia Father   . Cancer Brother     type unknown     History   Social History  . Marital Status: Widowed    Spouse Name: N/A    Number of Children: N/A  . Years of Education: N/A   Occupational History  . Not on file.   Social History Main Topics  . Smoking status: Former Smoker -- 1.5 packs/day    Quit date: 04/16/1955  . Smokeless tobacco: Not on file  . Alcohol Use: No  . Drug Use: No  . Sexually Active: Not on file   Other Topics Concern  . Not on file   Social History Narrative  . No narrative on file     BP 118/66  Pulse 62  Ht 5\' 11"  (1.803 m)  Wt 78.654 kg  (173 lb 6.4 oz)  BMI 24.18 kg/m2  Physical Exam:  Well appearing elderly man, NAD HEENT: Unremarkable Neck:  No JVD, no thyromegally Lymphatics:  No adenopathy Back:  No CVA tenderness Lungs:  Clear no wheezes, rales, or rhonchi. HEART:  Regular rate rhythm, no murmurs, no rubs, no clicks Abd:  soft, positive bowel sounds, no organomegally, no rebound, no guarding Ext:  2 plus pulses, no edema, no cyanosis, no clubbing Skin:  No rashes no nodules Neuro:  CN II through XII intact, motor grossly intact   DEVICE  Normal device function.  See PaceArt for details.   Assess/Plan:

## 2011-04-18 NOTE — Assessment & Plan Note (Signed)
He now has chronic atrial fibrillation. His ventricular rate is well controlled. He is pacing approximately 52% of the time.

## 2011-05-20 ENCOUNTER — Other Ambulatory Visit: Payer: Self-pay | Admitting: Radiation Oncology

## 2011-05-20 DIAGNOSIS — C61 Malignant neoplasm of prostate: Secondary | ICD-10-CM

## 2011-06-21 ENCOUNTER — Encounter: Payer: Self-pay | Admitting: Internal Medicine

## 2011-07-18 ENCOUNTER — Ambulatory Visit (INDEPENDENT_AMBULATORY_CARE_PROVIDER_SITE_OTHER): Payer: Medicare Other | Admitting: *Deleted

## 2011-07-18 DIAGNOSIS — I495 Sick sinus syndrome: Secondary | ICD-10-CM

## 2011-07-23 LAB — REMOTE PACEMAKER DEVICE
BRDY-0004RV: 130 {beats}/min
VENTRICULAR PACING PM: 52.23

## 2011-07-31 NOTE — Progress Notes (Signed)
Pacer remote 

## 2011-08-07 ENCOUNTER — Encounter: Payer: Self-pay | Admitting: Internal Medicine

## 2011-08-19 ENCOUNTER — Encounter: Payer: Self-pay | Admitting: *Deleted

## 2011-10-11 ENCOUNTER — Emergency Department (INDEPENDENT_AMBULATORY_CARE_PROVIDER_SITE_OTHER)
Admission: EM | Admit: 2011-10-11 | Discharge: 2011-10-11 | Disposition: A | Discharge status: Home / Self Care | Payer: Medicare Other | Source: Home / Self Care | Attending: Emergency Medicine | Admitting: Emergency Medicine

## 2011-10-11 ENCOUNTER — Encounter (HOSPITAL_COMMUNITY): Payer: Self-pay

## 2011-10-11 DIAGNOSIS — L84 Corns and callosities: Secondary | ICD-10-CM

## 2011-10-11 HISTORY — DX: Essential (primary) hypertension: I10

## 2011-10-11 MED ORDER — CEPHALEXIN 500 MG PO CAPS: 500.0000 mg | ORAL_CAPSULE | Freq: Three times a day (TID) | ORAL | Status: AC

## 2011-10-11 NOTE — ED Provider Notes (Signed)
Chief Complaint  Patient presents with  . Toe Pain    History of Present Illness:   [name] is an 78 year old male with a four-month history of pain over the tip of the left fourth toe. He denies any injury. It's been sore to touch, hurts to bear weight and to wear certain shoes and also to walk. It's been a little bit swollen, but he denies any purulent drainage.  Review of Systems:  Other than noted above, the patient denies any of the following symptoms: Systemic:  No fever, chills, sweats, weight loss, or fatigue. ENT:  No nasal congestion, rhinorrhea, sore throat, swelling of lips, tongue or throat. Resp:  No cough, wheezing, or shortness of breath. Skin:  No rash, itching, nodules, or suspicious lesions.  PMFSH:  Past medical history, family history, social history, meds, and allergies were reviewed.  Physical Exam:   Vital signs:  BP 131/73  Pulse 68  Temp(Src) 97.6 F (36.4 C) (Oral)  Resp 17  SpO2 99% Gen:  Alert, oriented, in no distress. ENT:  Pharynx clear, no intraoral lesions, moist mucous membranes. Lungs:  Clear to auscultation. Skin:  He has a hard corn of the tip of his left fourth toe. This was tender to touch. There is no erythema.  Procedure Note:  Verbal informed consent was obtained from the patient.  Risks and benefits were outlined with the patient.  Patient understands and accepts these risks.  Identity of the patient was confirmed verbally and by armband.    Procedure was performed as followed:  The corn was removed with a scalpel blade and, antibiotic ointment and a Band-Aid dressing were applied.  Patient tolerated the procedure well without any immediate complications.   Assessment:  The encounter diagnosis was Corn. Since he may have some secondary infection, he was provided with a prescription for 7 days of Keflex. I recommended that he call his primary care provider to have his protime checked next week.  Plan:   1.  The following meds were  prescribed:   New Prescriptions   CEPHALEXIN (KEFLEX) 500 MG CAPSULE    Take 1 capsule (500 mg total) by mouth 3 (three) times daily.   2.  The patient was instructed in symptomatic care and handouts were given. 3.  The patient was told to return if becoming worse in any way, if no better in 3 or 4 days, and given some red flag symptoms that would indicate earlier return.     Reuben Likes, MD 10/11/11 [phone]

## 2011-10-11 NOTE — ED Notes (Signed)
C/o pain to lt 4th toe for 1 month- especially with ambulation.  Denies injury.

## 2011-10-11 NOTE — Discharge Instructions (Signed)
Soak in warm water twice daily and rub well, apply antibiotic ointment.  Corns and Calluses Corns are small areas of thickened skin that usually occur on the top, sides, or tip of a toe. They contain a cone-shaped core with a point that can press on a nerve below. This causes pain. Calluses are areas of thickened skin that usually develop on hands, fingers, palms, soles of the feet, and heels. These are areas that experience frequent friction or pressure. CAUSES  Corns are usually the result of rubbing (friction) or pressure from shoes that are too tight or do not fit properly. Calluses are caused by repeated friction and pressure on the affected areas. SYMPTOMS  A hard growth on the skin.   Pain or tenderness under the skin.   Sometimes, redness and swelling.   Increased discomfort while wearing tight-fitting shoes.  DIAGNOSIS  Your caregiver can usually tell what the problem is by doing a physical exam. TREATMENT  Removing the cause of the friction or pressure is usually the only treatment needed. However, sometimes medicines can be used to help soften the hardened, thickened areas. These medicines include salicylic acid plasters and 12% ammonium lactate lotion. These medicines should only be used under the direction of your caregiver. HOME CARE INSTRUCTIONS   Try to remove pressure from the affected area.   You may wear donut-shaped corn pads to protect your skin.   You may use a pumice stone or nonmetallic nail file to gently reduce the thickness of a corn.   Wear properly fitted footwear.   If you have calluses on the hands, wear gloves during activities that cause friction.   If you have diabetes, you should regularly examine your feet. Tell your caregiver if you notice any problems with your feet.  SEEK IMMEDIATE MEDICAL CARE IF:   You have increased pain, swelling, redness, or warmth in the affected area.   Your corn or callus starts to drain fluid or bleeds.   You are  not getting better, even with treatment.  Document Released: 02/10/2004 Document Revised: 04/25/2011 Document Reviewed: 01/01/2011 [facility] Patient Information 2012 [place], [place].Corns and Calluses Corns are small areas of thickened skin that usually occur on the top, sides, or tip of a toe. They contain a cone-shaped core with a point that can press on a nerve below. This causes pain. Calluses are areas of thickened skin that usually develop on hands, fingers, palms, soles of the feet, and heels. These are areas that experience frequent friction or pressure. CAUSES  Corns are usually the result of rubbing (friction) or pressure from shoes that are too tight or do not fit properly. Calluses are caused by repeated friction and pressure on the affected areas. SYMPTOMS  A hard growth on the skin.   Pain or tenderness under the skin.   Sometimes, redness and swelling.   Increased discomfort while wearing tight-fitting shoes.  DIAGNOSIS  Your caregiver can usually tell what the problem is by doing a physical exam. TREATMENT  Removing the cause of the friction or pressure is usually the only treatment needed. However, sometimes medicines can be used to help soften the hardened, thickened areas. These medicines include salicylic acid plasters and 12% ammonium lactate lotion. These medicines should only be used under the direction of your caregiver. HOME CARE INSTRUCTIONS   Try to remove pressure from the affected area.   You may wear donut-shaped corn pads to protect your skin.   You may use a pumice stone or nonmetallic  nail file to gently reduce the thickness of a corn.   Wear properly fitted footwear.   If you have calluses on the hands, wear gloves during activities that cause friction.   If you have diabetes, you should regularly examine your feet. Tell your caregiver if you notice any problems with your feet.  SEEK IMMEDIATE MEDICAL CARE IF:   You have increased pain, swelling,  redness, or warmth in the affected area.   Your corn or callus starts to drain fluid or bleeds.   You are not getting better, even with treatment.  Document Released: 02/10/2004 Document Revised: 04/25/2011 Document Reviewed: 01/01/2011 [facility] Patient Information 2012 [place], [place].Corns and Calluses Corns are small areas of thickened skin that usually occur on the top, sides, or tip of a toe. They contain a cone-shaped core with a point that can press on a nerve below. This causes pain. Calluses are areas of thickened skin that usually develop on hands, fingers, palms, soles of the feet, and heels. These are areas that experience frequent friction or pressure. CAUSES  Corns are usually the result of rubbing (friction) or pressure from shoes that are too tight or do not fit properly. Calluses are caused by repeated friction and pressure on the affected areas. SYMPTOMS  A hard growth on the skin.   Pain or tenderness under the skin.   Sometimes, redness and swelling.   Increased discomfort while wearing tight-fitting shoes.  DIAGNOSIS  Your caregiver can usually tell what the problem is by doing a physical exam. TREATMENT  Removing the cause of the friction or pressure is usually the only treatment needed. However, sometimes medicines can be used to help soften the hardened, thickened areas. These medicines include salicylic acid plasters and 12% ammonium lactate lotion. These medicines should only be used under the direction of your caregiver. HOME CARE INSTRUCTIONS   Try to remove pressure from the affected area.   You may wear donut-shaped corn pads to protect your skin.   You may use a pumice stone or nonmetallic nail file to gently reduce the thickness of a corn.   Wear properly fitted footwear.   If you have calluses on the hands, wear gloves during activities that cause friction.   If you have diabetes, you should regularly examine your feet. Tell your caregiver if you  notice any problems with your feet.  SEEK IMMEDIATE MEDICAL CARE IF:   You have increased pain, swelling, redness, or warmth in the affected area.   Your corn or callus starts to drain fluid or bleeds.   You are not getting better, even with treatment.  Document Released: 02/10/2004 Document Revised: 04/25/2011 Document Reviewed: 01/01/2011 [facility] Patient Information 2012 [place], [place].  Corns and Calluses A thickening of the skin layer (usually over bony areas, such as toe joints) is known as a corn. Two types of corns exist: hard corns and soft corns. Calluses are painless areas of skin thickening that are caused by repeated pressure or irritation. Corns tend to affect toe joints and the skin between the toes; whereas, a callus can appear on any part of the body (especially the hands, feet, or knees).  SYMPTOMS   Corn:   Presence of a small (1/8 to 3/8 inch [3 to 10 mm in diameter]), painful bump on the side or over the joint of a toe.   Hard corns are more common on the outer portion of the little (fifth) toe at the joint.   Soft corns are more common  between bony bumps (prominences), usually between the fourth and fifth toes or between the second and third toes.   Callus:   A rough, thickened area of skin that appears after repeated pressure or irritation.  CAUSES  The purpose of corns and calluses is to protect an area of skin from injury caused by repeated irritation (rubbing or squeezing). The presence of pressure causes the skin cells to grow at a faster rate than the cells of unaffected areas. This leads to an overgrowth (corn or callus). As apposed to hard corns, soft corns tend to develop between toes, because there is more moisture. Soft corns are often the result of prolonged shoe wear, which leads to increased perspiration and moisture.  RISK INCREASES WITH:  Shoes that are too tight.   Occupations or sports that involve repetitive pressure on the hands (racquetball  and baseball) or sudden stops on hard surfaces (track and tennis).   Sports that require the athlete to wear shoes, perspire, or wear clothing or protective gear that causes the production of heat and friction.  PREVENTION  Properly fitted shoes and equipment.   Modify activities to prevent constant pressure on specific areas of skin.   If possible, wear padding over areas of skin that are exposed to repeated pressure or irritation.   Keep the area between the toes dry (with powder or by removing shoes often).   Relieve shoe pressure by stretching the areas of the shoe that cause the pressure and or use ointments to soften leather shoes.  PROGNOSIS  Corns and calluses typically subside if the activity that causes them is eliminated. Recovery may take up to 3 weeks. Recurrence is likely even with treatment if the cause is not removed.  RELATED COMPLICATIONS  If one overcompensates in an attempt to avoid pain, he or she may experience pain in other areas due to the changes in body movements (mechanics). TREATMENT  The best way to treat corns and calluses is to remove the source of pressure. Corn and callus pads may be helpful in reducing pressure on the affected skin. For soft corns, try to keep the affected area dry. If you cannot find shoes that fit properly, a shoe repair shop may be able to alter your shoes to reduce pressure. Occasionally a cushion for the bottom of the foot (metatarsal bar) worn within the shoe may relieve pressure on corns or calluses of the foot. For calluses, you may be able to peel or rub the thickened area with a pumice stone, sandstone, callus file, or with sandpaper to remove the callus; wetting the affected area may make this process more effective. Do not cut the corn or callus with a razor or knife. If the corn or callus must be removed, then a medically trained person should perform the procedure. After peeling away the upper layers of a corn once or twice a day, it  may be recommended to apply a non-prescription 5% to 10% salicylic ointment and cover the area with a bandage. It very uncommon to have the bony bumps (at toe joints) surgically removed. MEDICATION   If pain medication is necessary, nonsteroidal anti-inflammatory medications, such as aspirin and ibuprofen, or other minor pain relievers, such as acetaminophen, are often recommended. Contact your caregiver immediately if any bleeding, stomach upset, or signs of an allergic reaction occur.   Topical salicylic ointments (5% to 10%) may be of benefit.   Prescription pain medications may be given by a caregiver. Use only as directed and  only as much as you need.   Soak the foot for 20 minutes, twice a day, in a gallon of warm water. This may help to soften corns and calluses. Care should be taken to thoroughly dry the foot, especially between the toes, after soaking.  SEEK MEDICAL CARE IF:   Symptoms get worse or do not improve in 2 weeks despite treatment.   Any signs of infection develop, including redness, swelling, increased pain or tenderness, or increased warmth around the corn or callus.   New, unexplained symptoms develop (drugs used in treatment may produce side effects).  Document Released: 05/06/2005 Document Revised: 04/25/2011 Document Reviewed: 08/18/2008 [facility] Patient Information 2012 [place], [place].

## 2011-10-24 ENCOUNTER — Encounter: Payer: Medicare Other | Admitting: *Deleted

## 2011-11-04 ENCOUNTER — Encounter: Payer: Self-pay | Admitting: *Deleted

## 2011-11-10 ENCOUNTER — Encounter: Payer: Self-pay | Admitting: Internal Medicine

## 2011-11-11 ENCOUNTER — Ambulatory Visit (INDEPENDENT_AMBULATORY_CARE_PROVIDER_SITE_OTHER): Payer: BC Managed Care – PPO | Admitting: *Deleted

## 2011-11-11 DIAGNOSIS — Z950 Presence of cardiac pacemaker: Secondary | ICD-10-CM

## 2011-11-11 DIAGNOSIS — I495 Sick sinus syndrome: Secondary | ICD-10-CM

## 2011-11-13 LAB — REMOTE PACEMAKER DEVICE
AL IMPEDENCE PM: 472 Ohm
BATTERY VOLTAGE: 2.85 V
BMOD-0004RV: 5
BRDY-0002RV: 60 {beats}/min

## 2011-11-18 ENCOUNTER — Encounter: Payer: Self-pay | Admitting: *Deleted

## 2011-11-27 DIAGNOSIS — H02059 Trichiasis without entropian unspecified eye, unspecified eyelid: Secondary | ICD-10-CM | POA: Insufficient documentation

## 2011-11-27 DIAGNOSIS — H401190 Primary open-angle glaucoma, unspecified eye, stage unspecified: Secondary | ICD-10-CM | POA: Insufficient documentation

## 2012-01-28 IMAGING — CR DG ABDOMEN ACUTE W/ 1V CHEST
4 series · 4 of 4 positions shown · non-contrast
Comparison: 02/11/2009

CLINICAL DATA: Abdominal pain.  Nausea and vomiting.

ACUTE ABDOMEN SERIES (ABDOMEN 2 VIEW & CHEST 1 VIEW)

[w chest pa]
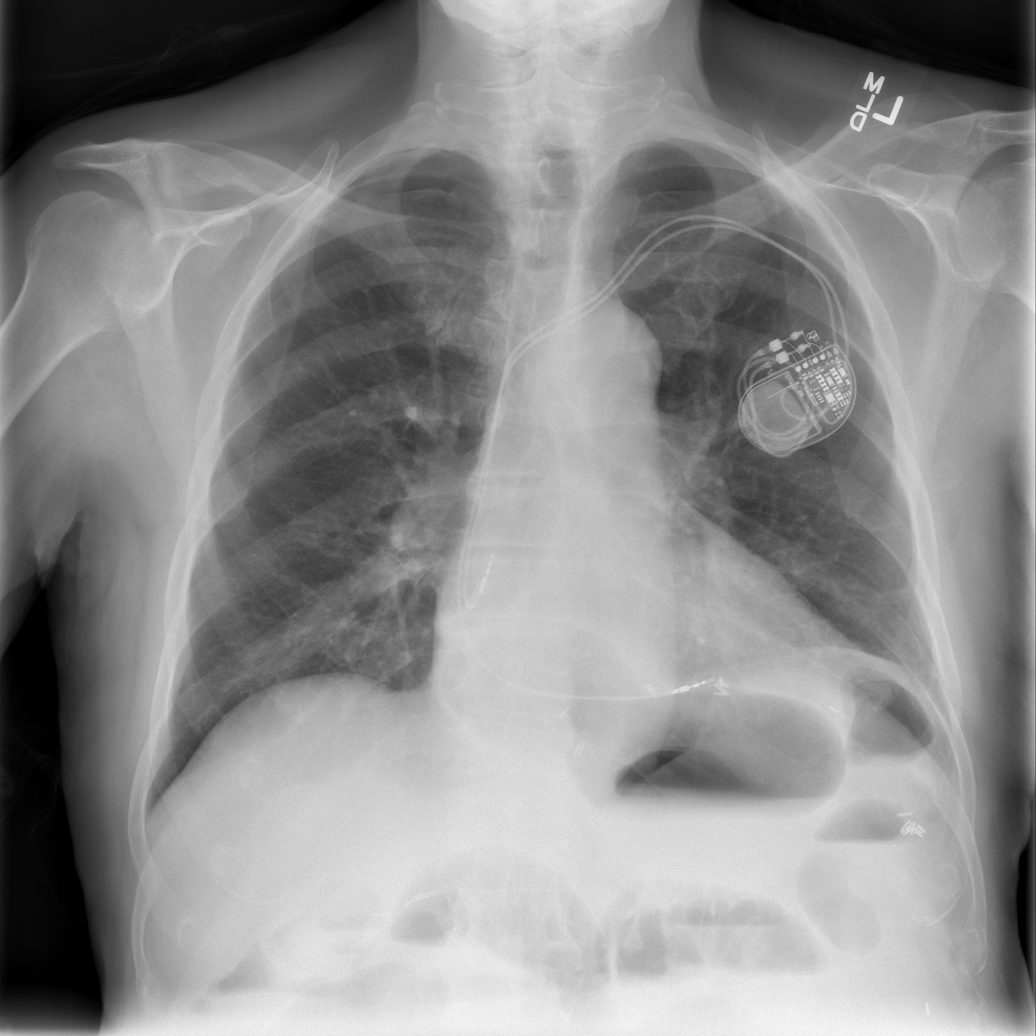

[w abdomen upright *]
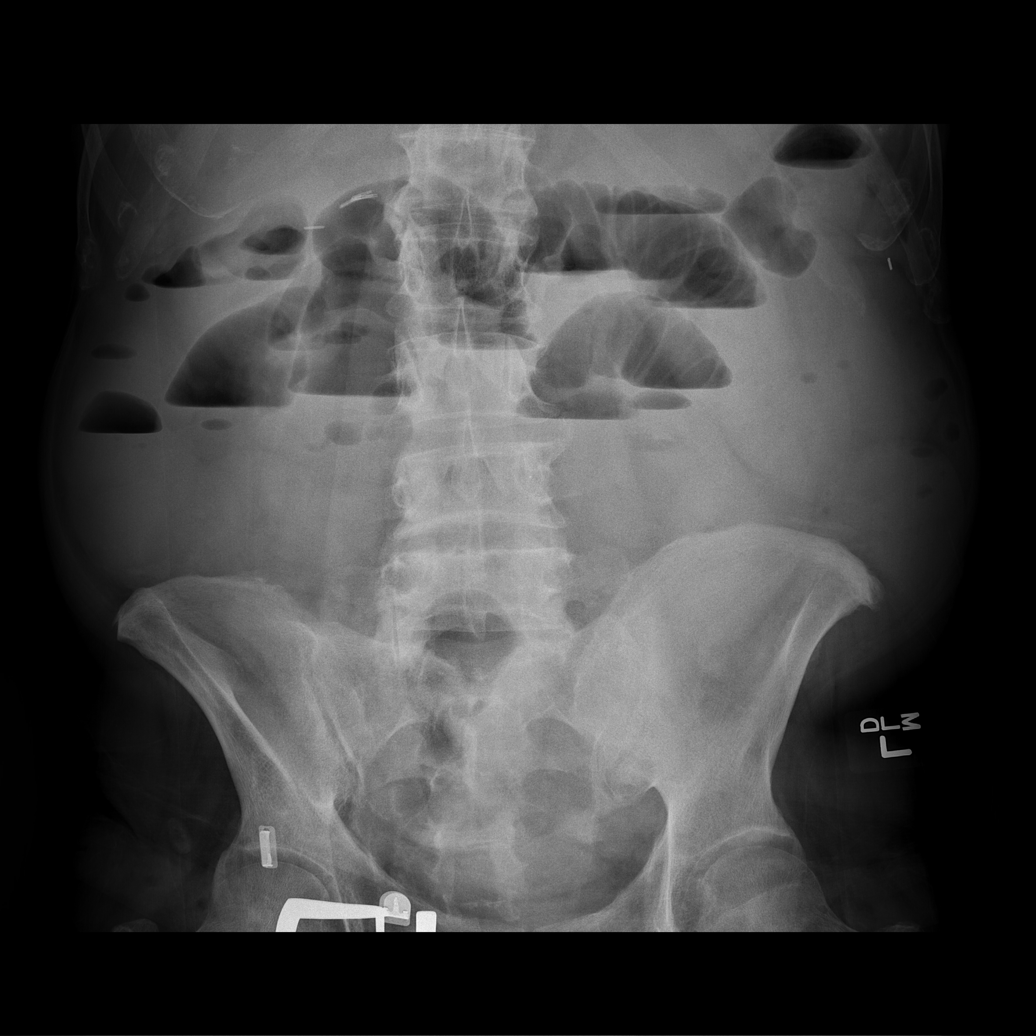

[t abdomen supine (1 of 2)]
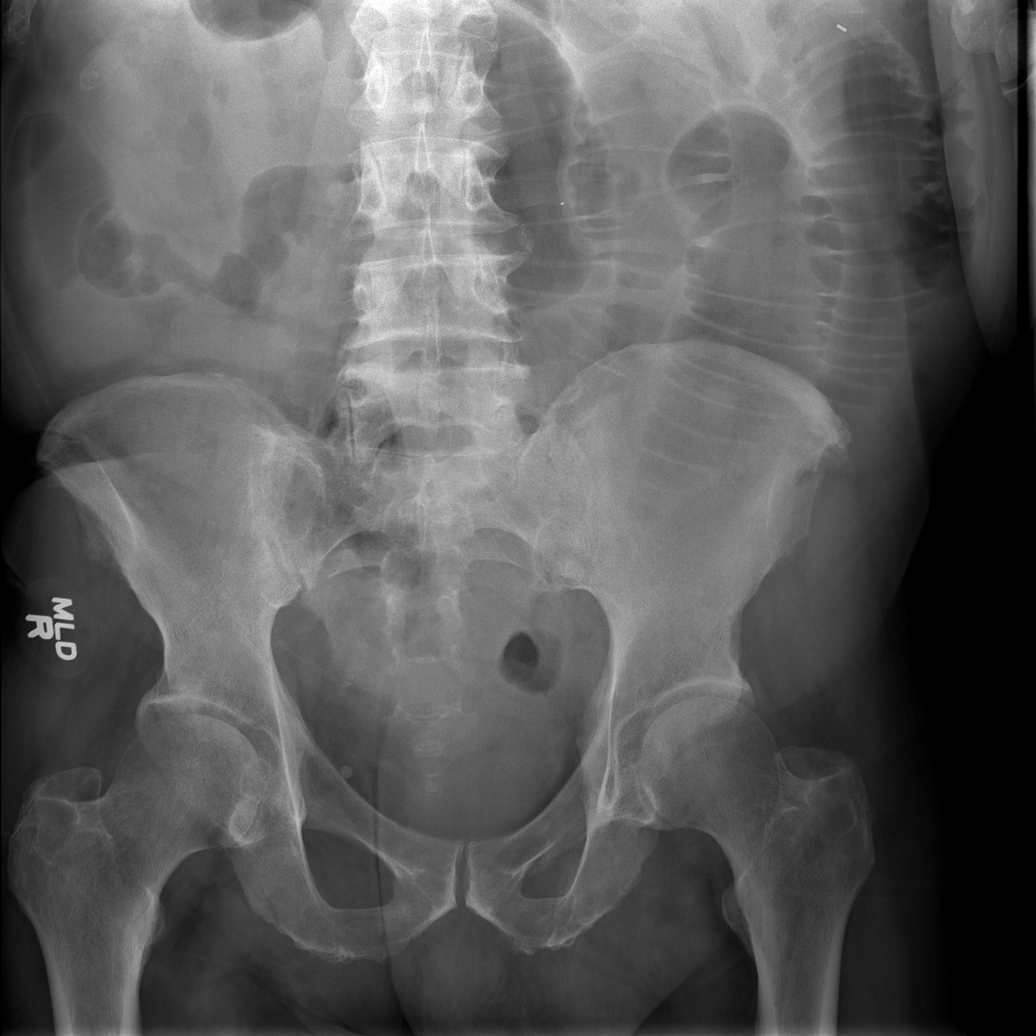

[t abdomen supine (2 of 2)]
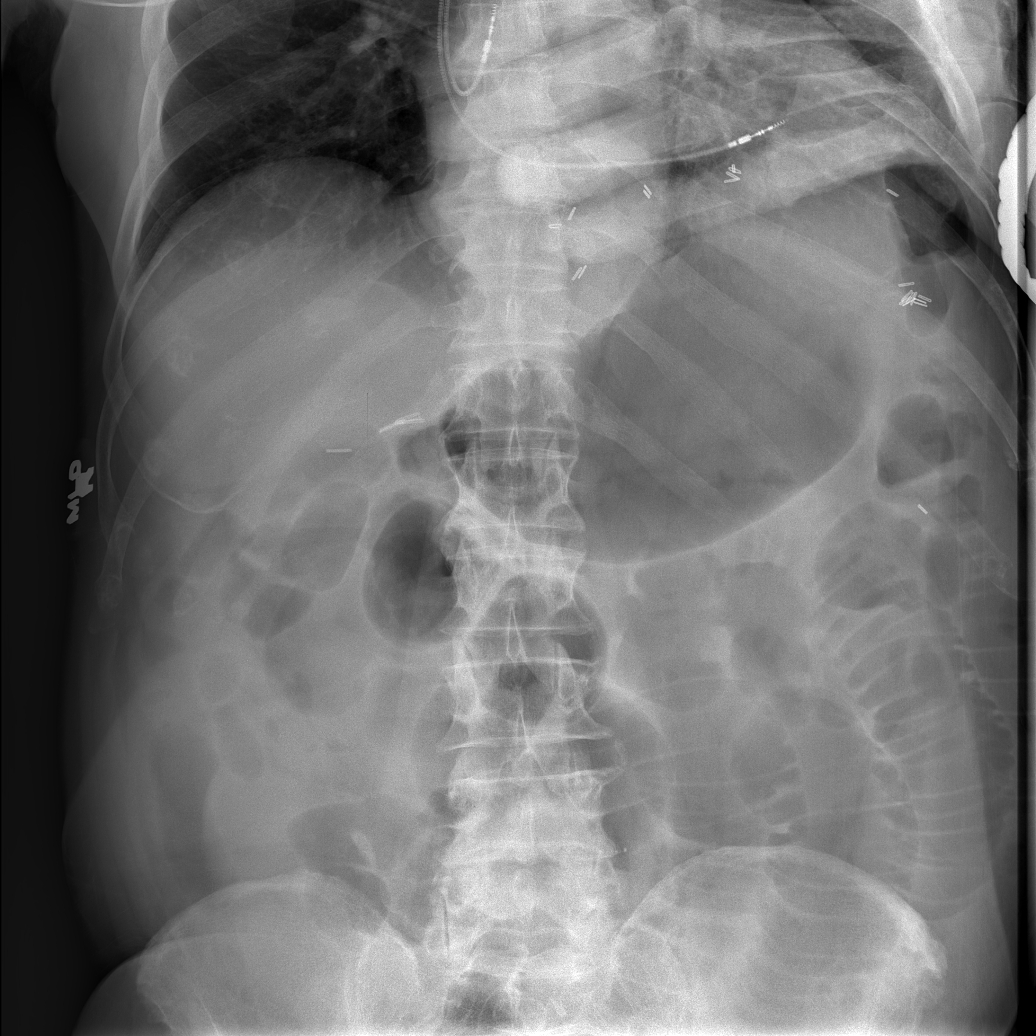

[4 of 4 positions shown; findings below may reference images not displayed]

FINDINGS: Left chest wall pacer device is noted with leads in the
right atrial appendage and right ventricle.

Heart size is normal.  No pleural effusions or pulmonary edema
noted.

Upright images shows numerous air-fluid levels.

The small bowel loops are increased in caliber measuring up to 6
cm.

No free intraperitoneal air noted.  Surgical clips are seen within
the size of the upper abdomen.
IMPRESSION: 1.  Examination is positive for small bowel obstruction.
2.  No active cardiopulmonary abnormalities.

## 2012-01-29 IMAGING — CR DG ABDOMEN 2V
2 series · 2 of 2 positions shown · non-contrast
Comparison: Chest radiograph 01/21/2010

CLINICAL DATA: Small bowel obstruction

ABDOMEN - 2 VIEW

[w abdomen upright *]
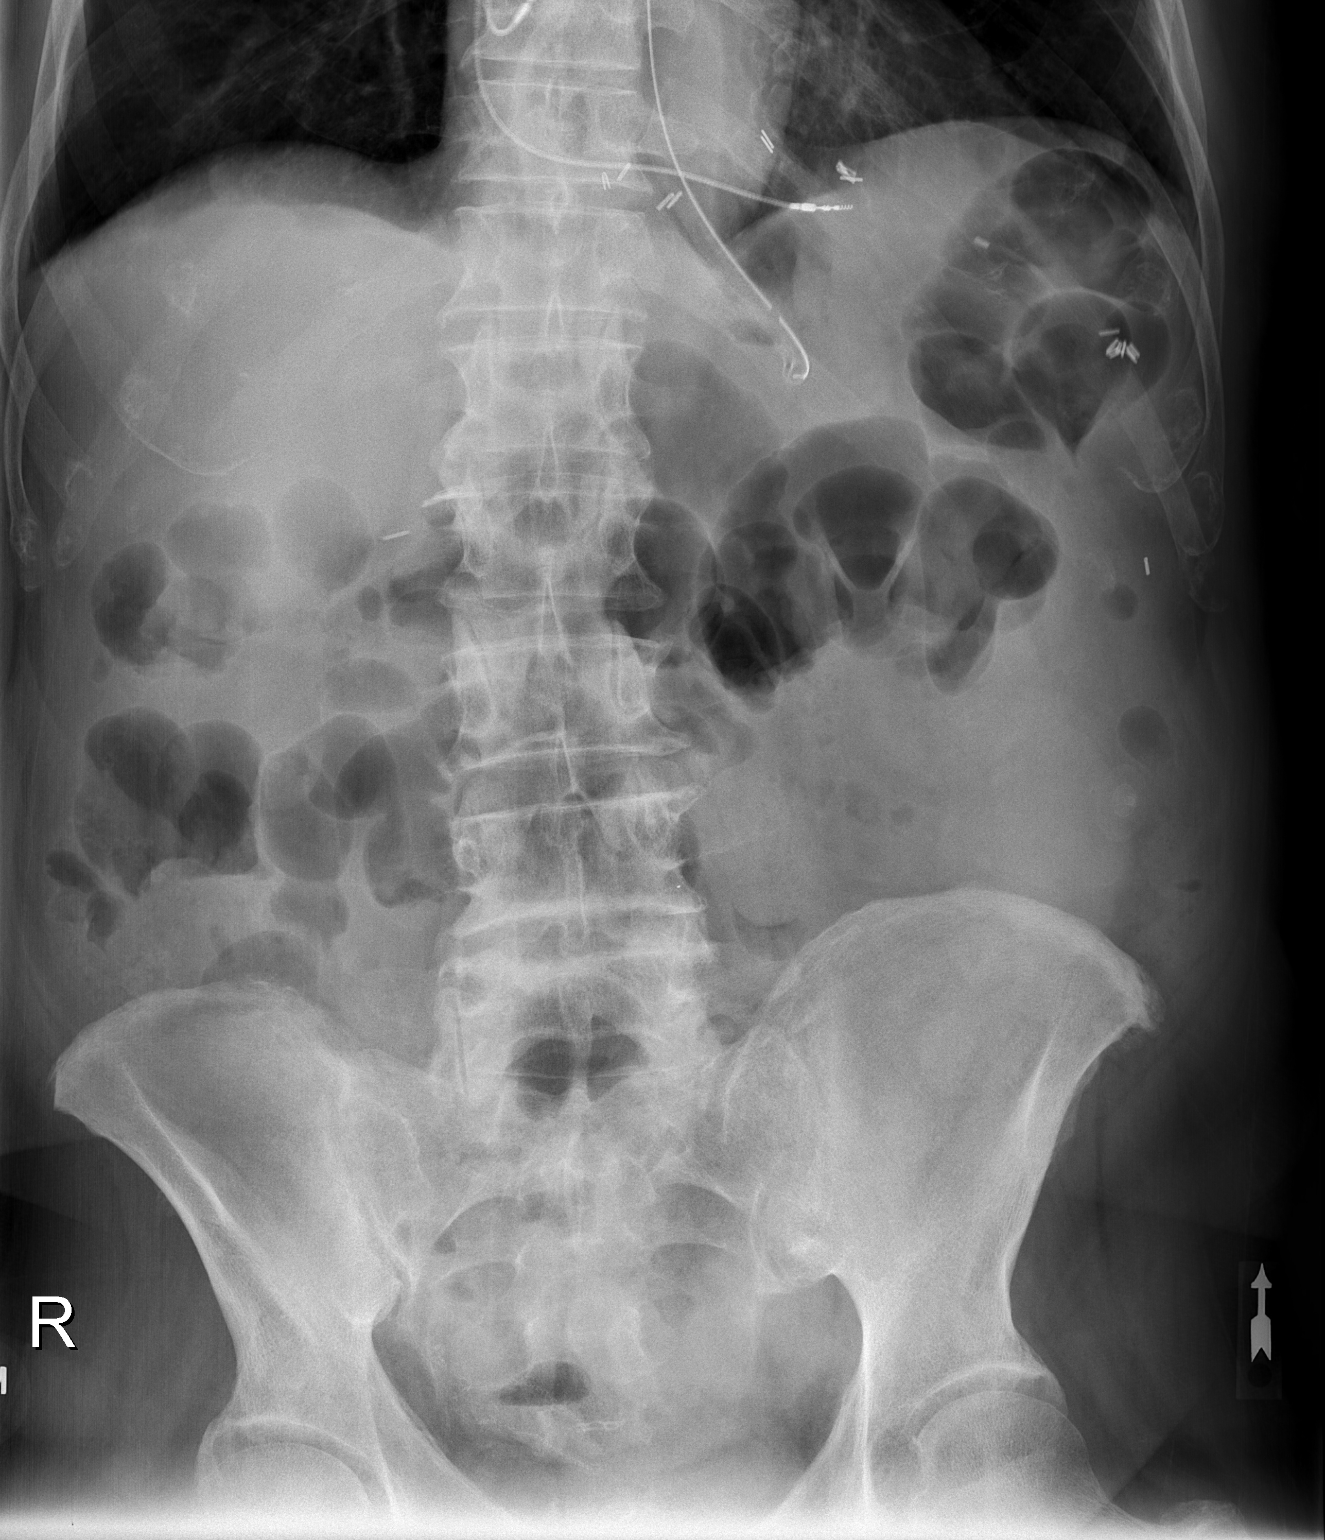

[t abdomen supine]
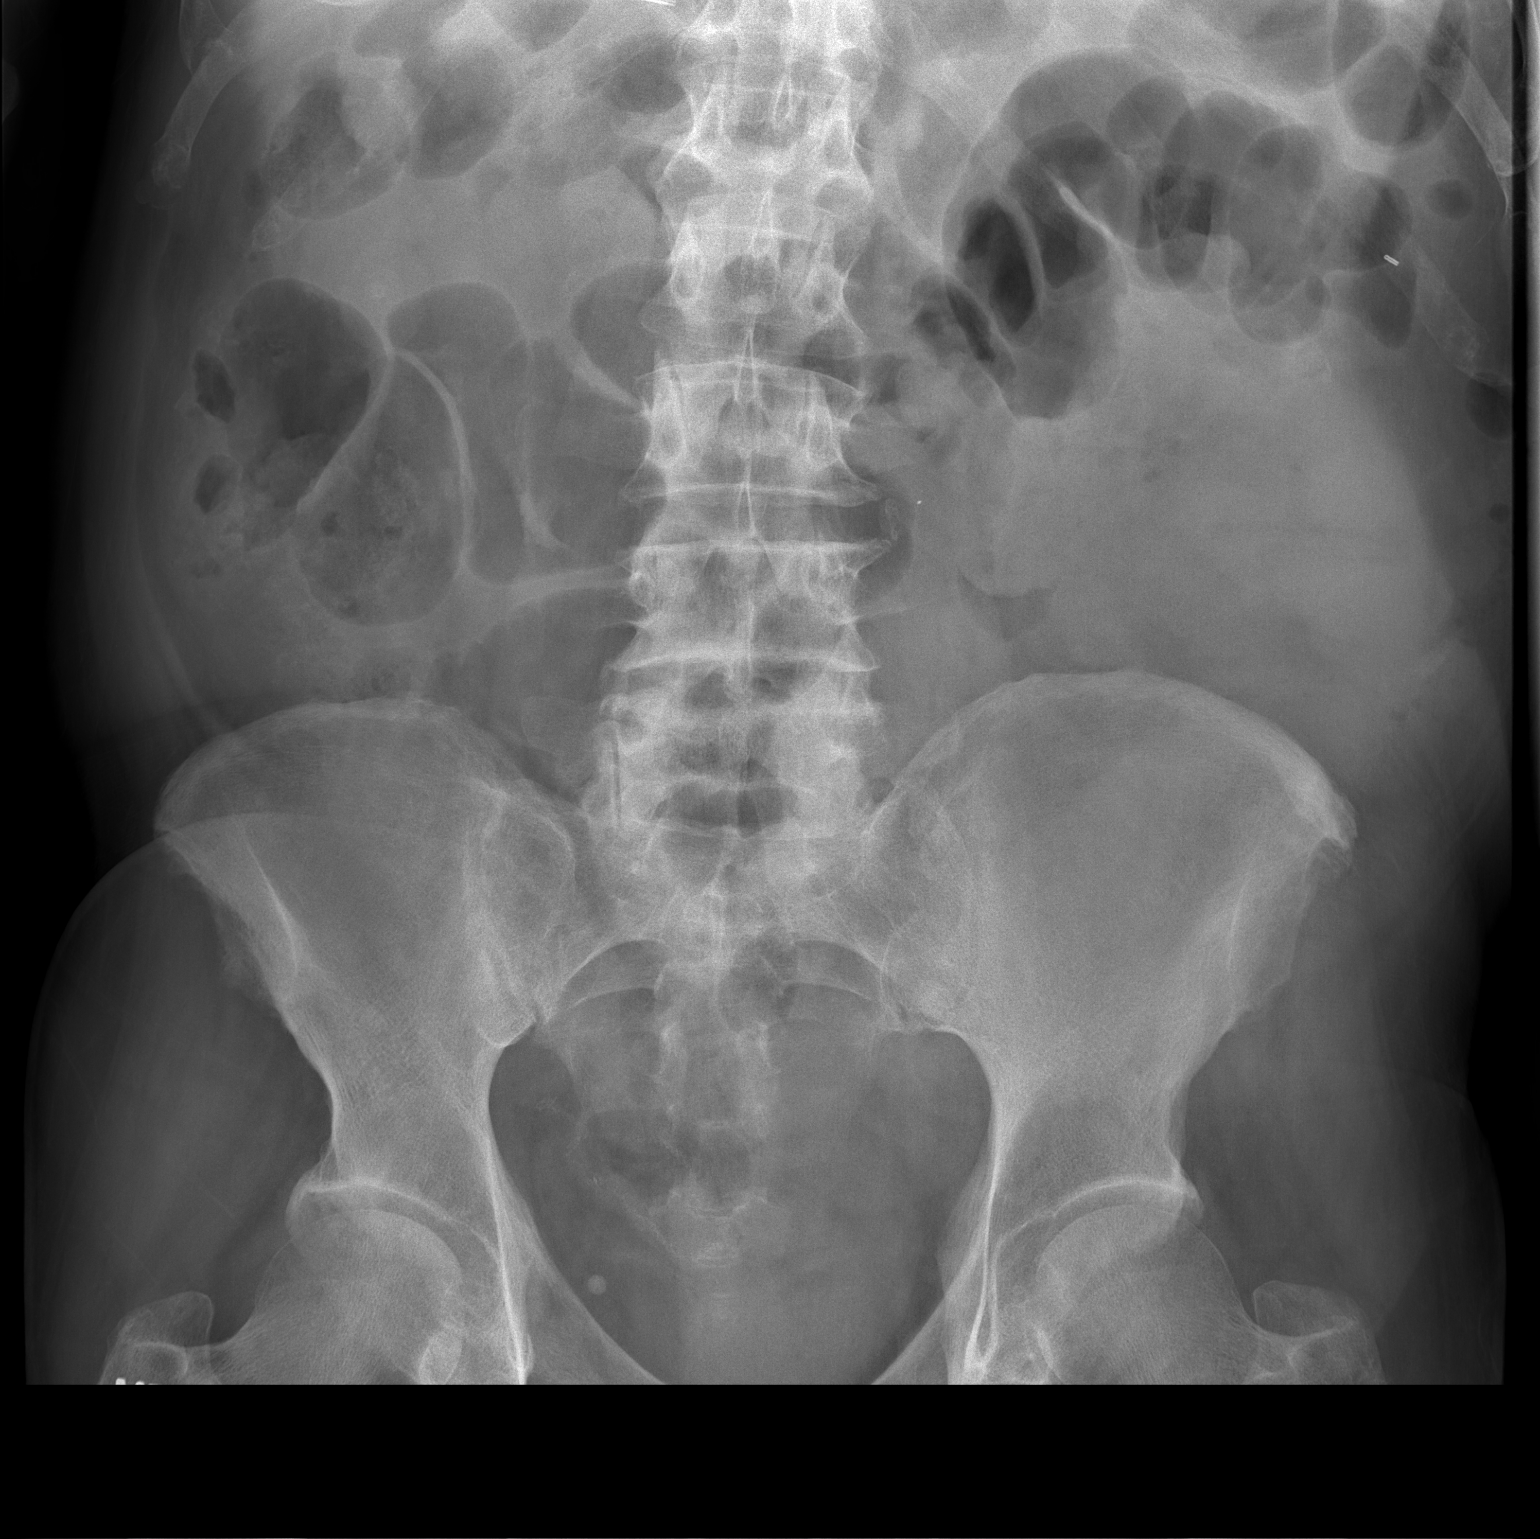

[2 of 2 positions shown; findings below may reference images not displayed]

FINDINGS: There is interval decrease in the dilated loops of small
bowel.  There is gas throughout the colon and rectosigmoid colon.
No evidence of intraperitoneal free air.  There is an NG tube
within the stomach.
IMPRESSION: Interval decompression of loops of small bowel following placement
NG tube.

## 2012-02-14 ENCOUNTER — Encounter (HOSPITAL_COMMUNITY): Payer: Self-pay | Admitting: Emergency Medicine

## 2012-02-14 ENCOUNTER — Other Ambulatory Visit: Payer: Self-pay

## 2012-02-14 ENCOUNTER — Emergency Department (INDEPENDENT_AMBULATORY_CARE_PROVIDER_SITE_OTHER)
Admission: EM | Admit: 2012-02-14 | Discharge: 2012-02-14 | Disposition: A | Discharge status: Nursing Home (Includes ICF) | Payer: Medicare Other | Source: Home / Self Care

## 2012-02-14 ENCOUNTER — Inpatient Hospital Stay (HOSPITAL_COMMUNITY)
Admission: EM | Admit: 2012-02-14 | Discharge: 2012-02-18 | DRG: 390 | Disposition: A | Discharge status: Home / Self Care | Payer: Medicare Other | Attending: Internal Medicine | Admitting: Internal Medicine

## 2012-02-14 ENCOUNTER — Emergency Department (INDEPENDENT_AMBULATORY_CARE_PROVIDER_SITE_OTHER): Payer: Medicare Other

## 2012-02-14 ENCOUNTER — Emergency Department (HOSPITAL_COMMUNITY): Payer: Medicare Other

## 2012-02-14 DIAGNOSIS — E119 Type 2 diabetes mellitus without complications: Secondary | ICD-10-CM

## 2012-02-14 DIAGNOSIS — I10 Essential (primary) hypertension: Secondary | ICD-10-CM | POA: Diagnosis present

## 2012-02-14 DIAGNOSIS — K56609 Unspecified intestinal obstruction, unspecified as to partial versus complete obstruction: Secondary | ICD-10-CM

## 2012-02-14 DIAGNOSIS — Z8546 Personal history of malignant neoplasm of prostate: Secondary | ICD-10-CM

## 2012-02-14 DIAGNOSIS — K565 Intestinal adhesions [bands], unspecified as to partial versus complete obstruction: Principal | ICD-10-CM | POA: Diagnosis present

## 2012-02-14 DIAGNOSIS — Z950 Presence of cardiac pacemaker: Secondary | ICD-10-CM

## 2012-02-14 DIAGNOSIS — R112 Nausea with vomiting, unspecified: Secondary | ICD-10-CM

## 2012-02-14 DIAGNOSIS — R109 Unspecified abdominal pain: Secondary | ICD-10-CM

## 2012-02-14 DIAGNOSIS — I495 Sick sinus syndrome: Secondary | ICD-10-CM

## 2012-02-14 DIAGNOSIS — I4891 Unspecified atrial fibrillation: Secondary | ICD-10-CM

## 2012-02-14 DIAGNOSIS — I480 Paroxysmal atrial fibrillation: Secondary | ICD-10-CM | POA: Diagnosis present

## 2012-02-14 HISTORY — DX: Malignant neoplasm of prostate: C61

## 2012-02-14 HISTORY — DX: Personal history of other diseases of the digestive system: Z87.19

## 2012-02-14 HISTORY — DX: Ventral hernia without obstruction or gangrene: K43.9

## 2012-02-14 LAB — CBC WITH DIFFERENTIAL/PLATELET
HCT: 41.2 % (ref 39.0–52.0)
Hemoglobin: 13.9 g/dL (ref 13.0–17.0)
Lymphocytes Relative: 23 % (ref 12–46)
Monocytes Absolute: 1 10*3/uL (ref 0.1–1.0)
Monocytes Relative: 9 % (ref 3–12)
Neutro Abs: 7.3 10*3/uL (ref 1.7–7.7)
WBC: 11 10*3/uL — ABNORMAL HIGH (ref 4.0–10.5)

## 2012-02-14 LAB — COMPREHENSIVE METABOLIC PANEL
BUN: 15 mg/dL (ref 6–23)
CO2: 22 mEq/L (ref 19–32)
Chloride: 100 mEq/L (ref 96–112)
Creatinine, Ser: 1.48 mg/dL — ABNORMAL HIGH (ref 0.50–1.35)
GFR calc non Af Amer: 41 mL/min — ABNORMAL LOW (ref >90)
Glucose, Bld: 148 mg/dL — ABNORMAL HIGH (ref 70–99)
Total Bilirubin: 0.3 mg/dL (ref 0.3–1.2)

## 2012-02-14 LAB — URINALYSIS, MICROSCOPIC ONLY
Hgb urine dipstick: NEGATIVE
Leukocytes, UA: NEGATIVE
Protein, ur: NEGATIVE mg/dL
Urobilinogen, UA: 0.2 mg/dL (ref 0.0–1.0)

## 2012-02-14 LAB — GLUCOSE, CAPILLARY: Glucose-Capillary: 165 mg/dL — ABNORMAL HIGH (ref 70–99)

## 2012-02-14 LAB — TROPONIN I: Troponin I: <0.3 ng/mL (ref <0.30)

## 2012-02-14 MED ORDER — ONDANSETRON HCL 4 MG/2ML IJ SOLN
4.0000 mg | Freq: Once | INTRAMUSCULAR | Status: AC
  Administered 2012-02-14: 4 mg via INTRAVENOUS
  Filled 2012-02-14: qty 2

## 2012-02-14 MED ORDER — HYDROMORPHONE HCL PF 1 MG/ML IJ SOLN
0.5000 mg | INTRAMUSCULAR | Status: DC | PRN
  Administered 2012-02-14: 0.5 mg via INTRAVENOUS
  Filled 2012-02-14: qty 1

## 2012-02-14 MED ORDER — ONDANSETRON HCL 4 MG/2ML IJ SOLN
4.0000 mg | Freq: Three times a day (TID) | INTRAMUSCULAR | Status: DC | PRN
  Administered 2012-02-14: 4 mg via INTRAVENOUS
  Filled 2012-02-14: qty 2

## 2012-02-14 MED ORDER — MORPHINE SULFATE 4 MG/ML IJ SOLN
2.0000 mg | Freq: Once | INTRAMUSCULAR | Status: AC
  Administered 2012-02-14: 2 mg via INTRAVENOUS
  Filled 2012-02-14: qty 1

## 2012-02-14 MED ORDER — LIDOCAINE HCL 2 % EX GEL
Freq: Once | CUTANEOUS | Status: AC
  Administered 2012-02-14: 20
  Filled 2012-02-14: qty 20

## 2012-02-14 MED ORDER — SODIUM CHLORIDE 0.9 % IV SOLN
Freq: Once | INTRAVENOUS | Status: AC
  Administered 2012-02-14: 21:00:00 via INTRAVENOUS

## 2012-02-14 MED ORDER — SODIUM CHLORIDE 0.9 % IV SOLN
INTRAVENOUS | Status: DC
  Administered 2012-02-15: via INTRAVENOUS

## 2012-02-14 NOTE — ED Notes (Signed)
Pt having abdominal pain for one week. Pt states it is getting worse, no vomiting, nausea, or diarrhea. Pt states he is not constipated, but feels like he has a blockage.

## 2012-02-14 NOTE — ED Notes (Signed)
Pt alert and talkative. Skin cool and dry. Color improved monitors maintained.

## 2012-02-14 NOTE — ED Notes (Signed)
Received report from Urgent Care that pt had SBO.  Informed CN and triage RN.

## 2012-02-14 NOTE — ED Notes (Signed)
Daniel, EMT drawing pt's blood and he became unresponsive.  Pt raised both arms up in the air and was non-verbal.  Pt drooling.  Unable to answer questions and follow commands. Episode lasted <1 min.  Pt then started to talk again but was slow to respond to questions.  Pt taken straight to treatment room. Dr. Jeraldine Loots called to bedside.  Family brought to treatment room as well.  No seizure history per family.  Family reports pt is diet controlled diabetic.

## 2012-02-14 NOTE — ED Provider Notes (Signed)
History     CSN: [id number]  Arrival date & time 02/14/12  1618   None     Chief Complaint  Patient presents with  . Abdominal Pain    (Consider location/radiation/quality/duration/timing/severity/associated sxs/prior treatment) HPI Comments: 78 year old man with abdominal pain for 5 days. This was associated with diarrhea for one to 2 days however it has stopped after taking a combination of Pepto-Bismol, Imodium AD, and Kaopectate. He points to the area of the abdomen in the midline just below the umbilicus as the area with the most discomfort. His last normal bowel movement was yesterday. He states he was soft and normal. His past medical history is significant for a colon resection, cholecystectomy, repair vital hernia. His family states he has 2 or 3 abdominal hernias. The obvious hernia is adjacent to the umbilicus on the right. Denies fever chills vomiting nausea. His appetite has been greatly diminished in the past few days.    Past Medical History  Diagnosis Date  . Tachycardia-bradycardia syndrome   . Atrial fibrillation   . Hypertension   . Diabetes mellitus     Past Surgical History  Procedure Date  . Exploratory laparotomy   . Laparoscopic cholecystectomy 2006    with lysis of adhesions   . Small intestine surgery 2006  . Hemorrhoid surgery   . Hiatal hernia repair 2002  . Pacemaker insertion     Family History  Problem Relation Age of Onset  . Pneumonia Father   . Cancer Brother     type unknown    History  Substance Use Topics  . Smoking status: Former Smoker -- 1.5 packs/day    Quit date: 04/16/1955  . Smokeless tobacco: Not on file  . Alcohol Use: No      Review of Systems  Constitutional: Positive for appetite change. Negative for fever, chills and fatigue.  HENT: Negative.   Respiratory: Negative.   Cardiovascular: Negative for chest pain and palpitations.  Gastrointestinal:       As per history of present illness  Genitourinary:  Negative for urgency, frequency and difficulty urinating.  Musculoskeletal: Negative.   Neurological: Negative.     Allergies  Review of patient's allergies indicates no known allergies.  Home Medications   Current Outpatient Rx  Name Route Sig Dispense Refill  . DILTIAZEM HCL ER COATED BEADS 240 MG PO TB24 Oral Take 240 mg by mouth daily.      Marland Kitchen METOPROLOL TARTRATE 25 MG PO TABS Oral Take 25 mg by mouth 2 (two) times daily.      Marland Kitchen OLMESARTAN MEDOXOMIL-HCTZ 40-12.5 MG PO TABS Oral Take 1 tablet by mouth daily.      Marland Kitchen TAMSULOSIN HCL 0.4 MG PO CAPS  take 1 capsule by mouth at bedtime 30 capsule 5  . COUMADIN PO Oral Take by mouth daily.      BP 123/86  Pulse 68  Temp 97.8 F (36.6 C) (Oral)  Resp 16  SpO2 99%  Physical Exam  Constitutional: He is oriented to person, place, and time. No distress.  Neck: Normal range of motion. Neck supple.  Cardiovascular: Normal rate and normal heart sounds.   Pulmonary/Chest: Effort normal and breath sounds normal.  Abdominal: He exhibits distension. He exhibits no mass. There is no tenderness. There is no rebound and no guarding.       The abdomen is symmetrically distended. Percussion reveals tympanitic calls the upper mid and lower abdomen. Also time percussion is dull only bilateral aspects and dependent areas. The  umbilical hernia is easily reduced however pops back out with intra-abdominal pressure. Mild tenderness below the umbilicus. Bowel sounds normoactive  Neurological: He is alert and oriented to person, place, and time.  Skin: Skin is warm and dry. No erythema.    ED Course  Procedures (including critical care time)  Labs Reviewed - No data to display Dg Abd Acute W/chest  02/14/2012  *RADIOLOGY REPORT*  Clinical Data: Pain.  ACUTE ABDOMEN SERIES (ABDOMEN 2 VIEW & CHEST 1 VIEW)  Comparison: 06/11/2010.  Findings: Trachea is midline.  Heart size stable.  An air-filled hiatal hernia is moderate in size.  Pacemaker lead tips project  over the right atrium and right ventricle.  Lungs are clear.  No pleural fluid.  Two views of the abdomen show multiple air-fluid levels and dilated loops of small bowel.  Dilated small bowel measures up to 6.1 cm. There is likely minimal colonic gas in the right abdomen.  Surgical changes are seen in the anatomic pelvis.  IMPRESSION: High-grade small bowel obstruction.   Original Report Authenticated By: Reyes Ivan, M.D.      1. Small bowel obstruction       MDM  Transfer to the emergency department via shuttle. Condition stable but serious.        Hayden Rasmussen, NP 02/14/12 1945

## 2012-02-14 NOTE — ED Notes (Signed)
Report called to [facility]N pt ready for transport.

## 2012-02-14 NOTE — ED Provider Notes (Signed)
Medical screening examination/treatment/procedure(s) were performed by non-physician practitioner and as supervising physician I was immediately available for consultation/collaboration. I discussed this personally with Onalee Hua and agree with his decision to transfer to the emergency department.  Leslee Home, M.D.   Reuben Likes, MD 02/14/12 [phone]

## 2012-02-14 NOTE — ED Notes (Signed)
Pt to ED from [facility] with acute abd results of small bowel obstruction

## 2012-02-14 NOTE — ED Notes (Signed)
First contact with pt received pt from triage. Pt pale, cool, diaphoretic. Pt responds to questions slowly. MAE randomly. Drooling noted, airway patent and respirations easy non labored.pt placed on monitors, Dr Jeraldine Loots called to bedside. Family at bedside.

## 2012-02-14 NOTE — ED Notes (Signed)
C/o mid abd pain at hernia and just below hernia since Sunday.  Also reports diarrhea. Denies nausea and vomiting.

## 2012-02-14 NOTE — H&P (Signed)
[facility] History and Physical  [name] ZOX:[id number] DOB: 1923-12-03 DOA: 02/14/2012  Referring physician: EDP PCP: [name], MD   Chief Complaint: ABD Pain Nausea and Vomiting  HPI: [name] is a 78 y.o. male who presents to the ED with complaints of Worsening ABD Pain X 5 days along with nausea and vomiting.  He denies any hematemesis and also denies any diarrhea or fevers or chills.  He has a history of multiple Abdominal surgeries and prostate cancer in the past and has had multiple bouts of bowel obstructions in the past.  The EDP contacted General Surgery, and patient was not deemed a  Surgical candidate due to his multiple medical co-morbidities, and he was referred for medical management.  He reports that his symptoms usually resolve with conservative medical management.    Review of Systems: The patient denies fever, weight loss, vision loss, decreased hearing, hoarseness, chest pain, syncope, dyspnea on exertion, peripheral edema, balance deficits, hemoptysis, melena, hematochezia, severe indigestion/heartburn, hematuria, incontinence, genital sores, muscle weakness, suspicious skin lesions, transient blindness, difficulty walking, depression, unusual weight change, abnormal bleeding, enlarged lymph nodes, angioedema, and breast masses.    Past Medical History  Diagnosis Date  . Tachycardia-bradycardia syndrome   . Atrial fibrillation   . Hypertension   . Diabetes mellitus   . Prostate cancer   . Hx SBO   . Ventral hernia    Past Surgical History  Procedure Date  . Exploratory laparotomy   . Laparoscopic cholecystectomy 2006    with lysis of adhesions   . Small intestine surgery 2006  . Hemorrhoid surgery   . Hiatal hernia repair 2002  . Pacemaker insertion   . Cholecystectomy open     Medications:  HOME MEDS: Prior to Admission medications   Medication Sig Start Date End Date Taking? Authorizing Provider  diltiazem (CARDIZEM  LA) 240 MG 24 hr tablet Take 240 mg by mouth daily.     Yes Historical Provider, MD  metoprolol tartrate (LOPRESSOR) 25 MG tablet Take 25 mg by mouth 2 (two) times daily.     Yes Historical Provider, MD  olmesartan-hydrochlorothiazide (BENICAR HCT) 40-12.5 MG per tablet Take 1 tablet by mouth daily.     Yes Historical Provider, MD  Tamsulosin HCl (FLOMAX) 0.4 MG CAPS take 1 capsule by mouth at bedtime 05/20/11  Yes Oneita Hurt, MD  warfarin (COUMADIN) 5 MG tablet Take 2.5 mg by mouth daily.   Yes Historical Provider, MD    No Known Allergies   Social History:  reports that he quit smoking about 56 years ago. He does not have any smokeless tobacco history on file. He reports that he does not drink alcohol or use illicit drugs.    Family History  Problem Relation Age of Onset  . Pneumonia Father   . Cancer Brother     type unknown     Physical Exam:  GEN:  Pleasant Elderly  78 year old thin African American Male examined  and in no acute distress; cooperative with exam  Filed Vitals:   02/14/12 2115 02/14/12 2130 02/14/12 2145 02/14/12 2200  BP: 130/84 134/80 148/79 133/66  Pulse: 70 67 72 64  Temp:      TempSrc:      Resp: 25 22 21 26   SpO2: 95% 96% 97% 98%   Blood pressure 133/66, pulse 64, temperature 97 F (36.1 C), temperature source Oral, resp. rate 26, SpO2 98.00%. PSYCH: He is alert and oriented  x4; does not appear anxious does not appear depressed; affect is normal HEENT: Normocephalic and Atraumatic, Mucous membranes pink; PERRLA; EOM intact; Fundi:  Benign;  No scleral icterus, Nares: Patent, Oropharynx: Clear,  Neck:  FROM, no cervical lymphadenopathy nor thyromegaly or carotid bruit; no JVD; Breasts:: Not examined CHEST WALL: No tenderness CHEST: Normal respiration, clear to auscultation bilaterally HEART: Regular rate and rhythm; no murmurs rubs or gallops BACK: No kyphosis or scoliosis; no CVA tenderness ABDOMEN: Decreased Bowel Sounds,  Distended,   Multiple ABD surgical scars , +Reducible hernia on right Upper quadrantsoft non-tender; no masses. Rectal Exam: Not done EXTREMITIES: No bone or joint deformity; age-appropriate arthropathy of the hands and knees; no cyanosis, clubbing or edema; no ulcerations. Genitalia: not examined PULSES: 2+ and symmetric SKIN: Normal hydration no rash or ulceration CNS: Cranial nerves 2-12 grossly intact no focal neurologic deficit   Labs on Admission:  Basic Metabolic Panel:  Lab 02/14/12 1610  NA 139  K 4.0  CL 100  CO2 22  GLUCOSE 148*  BUN 15  CREATININE 1.48*  CALCIUM 10.2  MG --  PHOS --   Liver Function Tests:  Lab 02/14/12 2103  AST 26  ALT 10  ALKPHOS 98  BILITOT 0.3  PROT 8.5*  ALBUMIN 4.6   No results found for this basename: LIPASE:5,AMYLASE:5 in the last 168 hours No results found for this basename: AMMONIA:5 in the last 168 hours CBC:  Lab 02/14/12 2103  WBC 11.0*  NEUTROABS 7.3  HGB 13.9  HCT 41.2  MCV 74.9*  PLT 162   Cardiac Enzymes:  Lab 02/14/12 2115  CKTOTAL --  CKMB --  CKMBINDEX --  TROPONINI <0.30    BNP (last 3 results) No results found for this basename: PROBNP:3 in the last 8760 hours CBG:  Lab 02/14/12 2103  GLUCAP 165*    Radiological Exams on Admission: Dg Abd Acute W/chest  02/14/2012  *RADIOLOGY REPORT*  Clinical Data: Pain.  ACUTE ABDOMEN SERIES (ABDOMEN 2 VIEW & CHEST 1 VIEW)  Comparison: 06/11/2010.  Findings: Trachea is midline.  Heart size stable.  An air-filled hiatal hernia is moderate in size.  Pacemaker lead tips project over the right atrium and right ventricle.  Lungs are clear.  No pleural fluid.  Two views of the abdomen show multiple air-fluid levels and dilated loops of small bowel.  Dilated small bowel measures up to 6.1 cm. There is likely minimal colonic gas in the right abdomen.  Surgical changes are seen in the anatomic pelvis.  IMPRESSION: High-grade small bowel obstruction.   Original Report Authenticated By:  Reyes Ivan, M.D.    Dg Abd Portable 1v  02/14/2012  *RADIOLOGY REPORT*  Clinical Data: Abdominal pain  PORTABLE ABDOMEN - 1 VIEW  Comparison: 02/14/2012 at 1914 hours  Findings: Again seen are dilated loops of small bowel in the left mid abdomen, worrisome for small bowel obstruction.  IMPRESSION: Dilated loops of small bowel in the left mid abdomen, worrisome for small bowel obstruction, unchanged.   Original Report Authenticated By: Charline Bills, M.D.     Assessment/Plan Principal Problem:  *SBO (small bowel obstruction) Active Problems:  Abdominal pain  Nausea & vomiting  DIABETES MELLITUS  Atrial fibrillation  BRADYCARDIA-TACHYCARDIA SYNDROME  Cardiac pacemaker in situ    Plan:    Admit to MED/Surg Bed NPO, NGTube placed to LIMS IVFs Antiemetics and Pain Control PRN. Reconcile Home Medications DVT prophylaxis ordered   Code Status:  FULL CODE Family Communication: Family at Bedside Disposition  Plan: Return to Home  Time spent: 60 minutes  Ron Parker [facility] Pager [phone]  If 7PM-7AM, please contact night-coverage [email or web address] Password [facility] 02/14/2012, 10:51 PM

## 2012-02-14 NOTE — ED Provider Notes (Addendum)
History     CSN: [id number]  Arrival date & time 02/14/12  6578   First MD Initiated Contact with Patient 02/14/12 2050      Chief Complaint  Patient presents with  . Abdominal Pain    (Consider location/radiation/quality/duration/timing/severity/associated sxs/prior treatment) HPI  The patient presents with abdominal pain.  Pain began approximately 5 days ago, has been waxing and waning, worse over the past 2 days.  The pain is focally about the mid abdomen, diffuse, sore, sharp.  The pain radiates throughout the abdomen.  He notes decreased appetite.  Initially the patient diarrhea, this stopped with medication.  He has no bowel movements in 24 hours. He denies chest pain, dyspnea.  States that he has chills, denies fever.   Past Medical History  Diagnosis Date  . Tachycardia-bradycardia syndrome   . Atrial fibrillation   . Hypertension   . Diabetes mellitus     Past Surgical History  Procedure Date  . Exploratory laparotomy   . Laparoscopic cholecystectomy 2006    with lysis of adhesions   . Small intestine surgery 2006  . Hemorrhoid surgery   . Hiatal hernia repair 2002  . Pacemaker insertion     Family History  Problem Relation Age of Onset  . Pneumonia Father   . Cancer Brother     type unknown    History  Substance Use Topics  . Smoking status: Former Smoker -- 1.5 packs/day    Quit date: 04/16/1955  . Smokeless tobacco: Not on file  . Alcohol Use: No      Review of Systems  All other systems reviewed and are negative.    Allergies  Review of patient's allergies indicates no known allergies.  Home Medications   Current Outpatient Rx  Name Route Sig Dispense Refill  . DILTIAZEM HCL ER COATED BEADS 240 MG PO TB24 Oral Take 240 mg by mouth daily.      Marland Kitchen METOPROLOL TARTRATE 25 MG PO TABS Oral Take 25 mg by mouth 2 (two) times daily.      Marland Kitchen OLMESARTAN MEDOXOMIL-HCTZ 40-12.5 MG PO TABS Oral Take 1 tablet by mouth daily.      Marland Kitchen TAMSULOSIN HCL  0.4 MG PO CAPS  take 1 capsule by mouth at bedtime 30 capsule 5  . WARFARIN SODIUM 5 MG PO TABS Oral Take 2.5 mg by mouth daily.      BP 156/83  Pulse 78  Temp 97 F (36.1 C) (Oral)  Resp 18  SpO2 95%  Physical Exam  Constitutional:       Elderly appearing M  HENT:  Head: Normocephalic and atraumatic.  Eyes: Conjunctivae normal are normal. Right eye exhibits no discharge. Left eye exhibits no discharge.  Neck: No tracheal deviation present.  Cardiovascular: Normal rate and regular rhythm.   Pulmonary/Chest: Effort normal. No stridor.  Abdominal: He exhibits distension. He exhibits no mass. There is tenderness. There is guarding. There is no rebound.       The abdomen is TTP w guarding  Musculoskeletal:       No deformity  Neurological: He is alert.  Skin: Skin is warm and dry. No erythema.    ED Course  Procedures (including critical care time)  Labs Reviewed  CBC WITH DIFFERENTIAL - Abnormal; Notable for the following:    WBC 11.0 (*)     MCV 74.9 (*)     MCH 25.3 (*)     All other components within normal limits  URINALYSIS, MICROSCOPIC ONLY -  Abnormal; Notable for the following:    Bilirubin Urine SMALL (*)     Squamous Epithelial / LPF FEW (*)     All other components within normal limits  GLUCOSE, CAPILLARY - Abnormal; Notable for the following:    Glucose-Capillary 165 (*)     All other components within normal limits  COMPREHENSIVE METABOLIC PANEL  TROPONIN I  PROTIME-INR   Dg Abd Acute W/chest  02/14/2012  *RADIOLOGY REPORT*  Clinical Data: Pain.  ACUTE ABDOMEN SERIES (ABDOMEN 2 VIEW & CHEST 1 VIEW)  Comparison: 06/11/2010.  Findings: Trachea is midline.  Heart size stable.  An air-filled hiatal hernia is moderate in size.  Pacemaker lead tips project over the right atrium and right ventricle.  Lungs are clear.  No pleural fluid.  Two views of the abdomen show multiple air-fluid levels and dilated loops of small bowel.  Dilated small bowel measures up to 6.1  cm. There is likely minimal colonic gas in the right abdomen.  Surgical changes are seen in the anatomic pelvis.  IMPRESSION: High-grade small bowel obstruction.   Original Report Authenticated By: Reyes Ivan, M.D.    Dg Abd Portable 1v  02/14/2012  *RADIOLOGY REPORT*  Clinical Data: Abdominal pain  PORTABLE ABDOMEN - 1 VIEW  Comparison: 02/14/2012 at 1914 hours  Findings: Again seen are dilated loops of small bowel in the left mid abdomen, worrisome for small bowel obstruction.  IMPRESSION: Dilated loops of small bowel in the left mid abdomen, worrisome for small bowel obstruction, unchanged.   Original Report Authenticated By: Charline Bills, M.D.      No diagnosis found.  Following placement of an NG tube the patient was initially better.  I discussed all of care briefly with the patient's family.  They instructed the patient is full code.  He'll discuss this further when the patient is more stable.  MDM  This elderly male with history of multiple small bowel obstructions, deemed to not be a surgical candidate due to her multiple comorbidities now presents with increasing abdominal pain.  On exam the patient is uncomfortable appearing with a distended tender abdomen.  Given his history and portable x-ray was performed, this was consistent with questionable SBO.  The patient had an NG tube placed, was provided analgesics and had a substantial improvement in his condition.  Given the patient's history, his comorbidities, the recurrent SBO he will be admitted for further evaluation and management.  Gerhard Munch, MD 02/14/12 2240  Gerhard Munch, MD 03/09/12 2141

## 2012-02-15 ENCOUNTER — Encounter (HOSPITAL_COMMUNITY): Payer: Self-pay | Admitting: *Deleted

## 2012-02-15 ENCOUNTER — Inpatient Hospital Stay (HOSPITAL_COMMUNITY): Payer: Medicare Other

## 2012-02-15 DIAGNOSIS — Z950 Presence of cardiac pacemaker: Secondary | ICD-10-CM

## 2012-02-15 DIAGNOSIS — I4891 Unspecified atrial fibrillation: Secondary | ICD-10-CM

## 2012-02-15 DIAGNOSIS — K56609 Unspecified intestinal obstruction, unspecified as to partial versus complete obstruction: Secondary | ICD-10-CM

## 2012-02-15 DIAGNOSIS — R109 Unspecified abdominal pain: Secondary | ICD-10-CM

## 2012-02-15 LAB — CBC
MCH: 25.2 pg — ABNORMAL LOW (ref 26.0–34.0)
MCHC: 33.5 g/dL (ref 30.0–36.0)
Platelets: 127 10*3/uL — ABNORMAL LOW (ref 150–400)
RBC: 4.33 MIL/uL (ref 4.22–5.81)
RDW: 15.6 % — ABNORMAL HIGH (ref 11.5–15.5)

## 2012-02-15 LAB — BASIC METABOLIC PANEL
Calcium: 8.3 mg/dL — ABNORMAL LOW (ref 8.4–10.5)
Creatinine, Ser: 1.35 mg/dL (ref 0.50–1.35)
GFR calc non Af Amer: 46 mL/min — ABNORMAL LOW (ref >90)
Glucose, Bld: 105 mg/dL — ABNORMAL HIGH (ref 70–99)
Sodium: 140 mEq/L (ref 135–145)

## 2012-02-15 LAB — GLUCOSE, CAPILLARY: Glucose-Capillary: 99 mg/dL (ref 70–99)

## 2012-02-15 LAB — HEMOGLOBIN A1C
Hgb A1c MFr Bld: 6.5 % — ABNORMAL HIGH (ref <5.7)
Mean Plasma Glucose: 140 mg/dL — ABNORMAL HIGH (ref <117)

## 2012-02-15 MED ORDER — ONDANSETRON HCL 4 MG PO TABS: 4.0000 mg | ORAL_TABLET | Freq: Four times a day (QID) | ORAL | Status: DC | PRN

## 2012-02-15 MED ORDER — INSULIN ASPART 100 UNIT/ML ~~LOC~~ SOLN
0.0000 [IU] | Freq: Four times a day (QID) | SUBCUTANEOUS | Status: DC
  Administered 2012-02-16 – 2012-02-17 (×2): 1 [IU] via SUBCUTANEOUS
  Administered 2012-02-17: 3 [IU] via SUBCUTANEOUS
  Administered 2012-02-18 (×2): 1 [IU] via SUBCUTANEOUS

## 2012-02-15 MED ORDER — SODIUM CHLORIDE 0.9 % IV SOLN: INTRAVENOUS | Status: DC

## 2012-02-15 MED ORDER — ENOXAPARIN SODIUM 40 MG/0.4ML ~~LOC~~ SOLN
40.0000 mg | SUBCUTANEOUS | Status: DC
  Administered 2012-02-15 – 2012-02-17 (×3): 40 mg via SUBCUTANEOUS
  Filled 2012-02-15 (×3): qty 0.4

## 2012-02-15 MED ORDER — ZOLPIDEM TARTRATE 5 MG PO TABS: 5.0000 mg | ORAL_TABLET | Freq: Every evening | ORAL | Status: DC | PRN

## 2012-02-15 MED ORDER — BIOTENE DRY MOUTH MT LIQD
15.0000 mL | Freq: Two times a day (BID) | OROMUCOSAL | Status: DC
  Administered 2012-02-15 – 2012-02-18 (×7): 15 mL via OROMUCOSAL

## 2012-02-15 MED ORDER — DEXTROSE-NACL 5-0.45 % IV SOLN
INTRAVENOUS | Status: DC
  Administered 2012-02-15 – 2012-02-16 (×2): via INTRAVENOUS

## 2012-02-15 MED ORDER — CHLORHEXIDINE GLUCONATE 0.12 % MT SOLN
15.0000 mL | Freq: Two times a day (BID) | OROMUCOSAL | Status: DC
  Administered 2012-02-15 – 2012-02-18 (×7): 15 mL via OROMUCOSAL
  Filled 2012-02-15 (×4): qty 15

## 2012-02-15 MED ORDER — METOPROLOL TARTRATE 1 MG/ML IV SOLN
5.0000 mg | INTRAVENOUS | Status: DC | PRN
  Administered 2012-02-15: 5 mg via INTRAVENOUS
  Filled 2012-02-15: qty 5

## 2012-02-15 MED ORDER — HYDROMORPHONE HCL PF 1 MG/ML IJ SOLN
0.5000 mg | INTRAMUSCULAR | Status: DC | PRN
  Administered 2012-02-17: 1 mg via INTRAVENOUS
  Filled 2012-02-15: qty 1

## 2012-02-15 MED ORDER — SODIUM CHLORIDE 0.9 % IV SOLN
INTRAVENOUS | Status: DC
  Administered 2012-02-15: 10:00:00 via INTRAVENOUS

## 2012-02-15 MED ORDER — ONDANSETRON HCL 4 MG/2ML IJ SOLN: 4.0000 mg | Freq: Four times a day (QID) | INTRAMUSCULAR | Status: DC | PRN

## 2012-02-15 MED ORDER — ACETAMINOPHEN 325 MG PO TABS
650.0000 mg | ORAL_TABLET | Freq: Four times a day (QID) | ORAL | Status: DC | PRN
  Administered 2012-02-17: 650 mg via ORAL
  Filled 2012-02-15: qty 2

## 2012-02-15 MED ORDER — ACETAMINOPHEN 650 MG RE SUPP: 650.0000 mg | Freq: Four times a day (QID) | RECTAL | Status: DC | PRN

## 2012-02-15 NOTE — Progress Notes (Signed)
Order for patient to be placed on telemetry monitoring, has history of a-fib.  Patient to be moved to room 13 for telemetry monitoring. Room 12 is not a tele room.

## 2012-02-15 NOTE — Progress Notes (Addendum)
[facility]                                                                                Patient Demographics  [name], is a 78 y.o. male  ZOX:[id number]  WJX:[id number]  DOB - 1924/05/02  Admit date - 02/14/2012  Admitting Physician Ron Parker, MD  Outpatient Primary MD for the patient is [name], MD  LOS - 1   Chief Complaint  Patient presents with  . Abdominal Pain        Assessment & Plan    1. Recurrent small bowel obstruction in a patient with multiple previous abdominal surgeries and history of adhesions- continue NG to decompress bowel, his abdominal pain has resolved, preference would be medical treatment, however patient is in otherwise good state of health and very functional, we'll request surgery to follow the patient.   2. History of tachybradycardia syndrome, paroxysmal A. fib, cardiac pacemaker-will place him on telemetry, in no acute issues, goal will be rate controlled, unable to take his Cardizem, Coumadin and Lopressor, will place him on when necessary IV Lopressor.  Lab Results  Component Value Date   INR 1.23 02/14/2012   INR 2.30* 06/13/2010   INR 2.18* 06/12/2010     3. History of diabetes mellitus type 2-no acute issues every 4 Accu-Cheks with sliding scale low dose.    4. Hypertension-no acute issues, unable to take by mouth medications, when necessary IV Lopressor.    Code Status: Full  Family Communication: D/W the patient  Disposition Plan: To be decided    Procedures KUB, NG   Consults  neurosurgery   Time Spent in minutes  35   Antibiotics    Anti-infectives    None      Scheduled Meds:   . sodium chloride   Intravenous Once  . antiseptic oral rinse  15 mL Mouth Rinse q12n4p  . chlorhexidine  15 mL Mouth Rinse BID  . enoxaparin (LOVENOX) injection  40 mg Subcutaneous Q24H  . lidocaine   Other Once  . morphine  2 mg Intravenous Once  . ondansetron (ZOFRAN) IV  4 mg  Intravenous Once  . DISCONTD: sodium chloride   Intravenous STAT   Continuous Infusions:   . sodium chloride 100 mL/hr at 02/15/12 0030   PRN Meds:.acetaminophen, acetaminophen, HYDROmorphone (DILAUDID) injection, ondansetron (ZOFRAN) IV, ondansetron, zolpidem, DISCONTD:  HYDROmorphone (DILAUDID) injection, DISCONTD: ondansetron (ZOFRAN) IV   DVT Prophylaxis  Lovenox     Leroy Sea M.D on 02/15/2012 at 9:39 AM  Between 7am to 7pm - Pager - [phone]  After 7pm go to [email or web address] - password [email or web address]  And look for the night coverage person covering for me after hours  [facility] Office  [phone]    Subjective:   [name] today has, No headache, No chest pain, No abdominal pain - No Nausea, No new weakness tingling or numbness, No Cough - SOB.   Objective:   Filed Vitals:   02/14/12 2145 02/14/12 2200 02/15/12 0000 02/15/12 0602  BP: 148/79 133/66 128/73 129/81  Pulse: 72 64 72 98  Temp:   97.5 F (36.4 C) 98.2 F (36.8 C)  TempSrc:  Oral Oral  Resp: 21 26 22 20   Height:   5\' 11"  (1.803 m)   Weight:   73.5 kg (162 lb 0.6 oz)   SpO2: 97% 98% 100% 99%    Wt Readings from Last 3 Encounters:  02/15/12 73.5 kg (162 lb 0.6 oz)  04/18/11 78.654 kg (173 lb 6.4 oz)  03/13/10 76.204 kg (168 lb)     Intake/Output Summary (Last 24 hours) at 02/15/12 0939 Last data filed at 02/15/12 0500  Gross per 24 hour  Intake    450 ml  Output    950 ml  Net   -500 ml    Exam Awake Alert, Oriented X 3, No new F.N deficits, Normal affect [name],PERRAL Supple Neck,No JVD, No cervical lymphadenopathy appriciated.  Symmetrical Chest wall movement, Good air movement bilaterally, CTAB RRR,No Gallops,Rubs or new Murmurs, No Parasternal Heave Hypoactive B.Sounds, Abd Soft, Non tender, No organomegaly appriciated, No rebound - guarding or rigidity. He's wearing an NG tube, small reducible ventral hernia, large previous abdominal surgery scar stable No  Cyanosis, Clubbing or edema, No new Rash or bruise     Data Review   Micro Results No results found for this or any previous visit (from the past 240 hour(s)).  Radiology Reports Dg Abd Acute W/chest  02/14/2012  *RADIOLOGY REPORT*  Clinical Data: Pain.  ACUTE ABDOMEN SERIES (ABDOMEN 2 VIEW & CHEST 1 VIEW)  Comparison: 06/11/2010.  Findings: Trachea is midline.  Heart size stable.  An air-filled hiatal hernia is moderate in size.  Pacemaker lead tips project over the right atrium and right ventricle.  Lungs are clear.  No pleural fluid.  Two views of the abdomen show multiple air-fluid levels and dilated loops of small bowel.  Dilated small bowel measures up to 6.1 cm. There is likely minimal colonic gas in the right abdomen.  Surgical changes are seen in the anatomic pelvis.  IMPRESSION: High-grade small bowel obstruction.   Original Report Authenticated By: Reyes Ivan, M.D.    Dg Abd Portable 1v  02/15/2012  *RADIOLOGY REPORT*  Clinical Data: Small bowel obstruction.  PORTABLE ABDOMEN - 1 VIEW  Comparison: Abdominal radiograph 02/14/2012.  Findings: There is a paucity of distal colonic and rectal gas. Numerous dilated loops of small bowel are again noted projecting over the central abdomen and left upper quadrant, measuring up to 5.3 cm in diameter.  No definite pneumoperitoneum identified on this single supine view of the abdomen.  IMPRESSION: 1.  Findings remain consistent with small bowel obstruction, as above.   Original Report Authenticated By: Florencia Reasons, M.D.    Dg Abd Portable 1v  02/14/2012  *RADIOLOGY REPORT*  Clinical Data: Abdominal pain  PORTABLE ABDOMEN - 1 VIEW  Comparison: 02/14/2012 at 1914 hours  Findings: Again seen are dilated loops of small bowel in the left mid abdomen, worrisome for small bowel obstruction.  IMPRESSION: Dilated loops of small bowel in the left mid abdomen, worrisome for small bowel obstruction, unchanged.   Original Report Authenticated By:  Charline Bills, M.D.     CBC  Lab 02/15/12 0706 02/14/12 2103  WBC 5.7 11.0*  HGB 10.9* 13.9  HCT 32.5* 41.2  PLT 127* 162  MCV 75.1* 74.9*  MCH 25.2* 25.3*  MCHC 33.5 33.7  RDW 15.6* 15.5  LYMPHSABS -- 2.6  MONOABS -- 1.0  EOSABS -- 0.1  BASOSABS -- 0.0  BANDABS -- --    Chemistries   Lab 02/15/12 0706 02/14/12 2103  NA 140 139  K 4.6 4.0  CL 108 100  CO2 24 22  GLUCOSE 105* 148*  BUN 14 15  CREATININE 1.35 1.48*  CALCIUM 8.3* 10.2  MG -- --  AST -- 26  ALT -- 10  ALKPHOS -- 98  BILITOT -- 0.3   ------------------------------------------------------------------------------------------------------------------ estimated creatinine clearance is 41.6 ml/min (by C-G formula based on Cr of 1.35). ------------------------------------------------------------------------------------------------------------------ No results found for this basename: HGBA1C:2 in the last 72 hours ------------------------------------------------------------------------------------------------------------------ No results found for this basename: CHOL:2,HDL:2,LDLCALC:2,TRIG:2,CHOLHDL:2,LDLDIRECT:2 in the last 72 hours ------------------------------------------------------------------------------------------------------------------ No results found for this basename: TSH,T4TOTAL,FREET3,T3FREE,THYROIDAB in the last 72 hours ------------------------------------------------------------------------------------------------------------------ No results found for this basename: VITAMINB12:2,FOLATE:2,FERRITIN:2,TIBC:2,IRON:2,RETICCTPCT:2 in the last 72 hours  Coagulation profile  Lab 02/14/12 2115  INR 1.23  PROTIME --    No results found for this basename: DDIMER:2 in the last 72 hours  Cardiac Enzymes  Lab 02/14/12 2115  CKMB --  TROPONINI <0.30  MYOGLOBIN --   ------------------------------------------------------------------------------------------------------------------ No  components found with this basename: POCBNP:3

## 2012-02-15 NOTE — Progress Notes (Signed)
Patient moved to 6n13 for telemetry monotoring.

## 2012-02-15 NOTE — Consult Note (Addendum)
[name] DOB: 1923-09-09 MRN: [id number]                                                                                      DATE: 02/14/2012  PCP: [name], MD Referring Provider: No ref. provider found  IMPRESSION:  1. Recurrent small bowel obstruction likely secondary to adhesions 2.diabetes 3. Atrial fibrillation with history of bradycardia/tachycardia syndrome. 4.status post open hiatal hernia repair, status post open cholecystectomy, status post laparotomy with small bowel resection for incarcerated hernia. PLAN:   the patient is clinically stable and I think we can continue NG suction and followup. A CT scan might be helpful.  He had a partial small bowel structure which resolved spontaneously a few months ago and hopefully this will do the same.                 CC:  Chief Complaint  Patient presents with  . Abdominal Pain    HPI:  [name] is a 78 y.o.  male who presents for evaluation of another partial small bowel obstruction. He presented yesterday to the emergency room with worsening abdominal pain for for 5 days with nausea and vomiting. He had a bowel movement of the day earlier and pass gas the day of admission. He had bouts of small bowel structure in the past which have resolved. He does have multiple medical comorbidities.Marland Kitchen  PMH:  has a past medical history of Tachycardia-bradycardia syndrome; Atrial fibrillation; Hypertension; Diabetes mellitus; SBO; Ventral hernia; and Prostate cancer.  PSH:   has past surgical history that includes Exploratory laparotomy; Small intestine surgery (09/16/2004); Hemorrhoid surgery; Hiatal hernia repair (2002); Pacemaker insertion; and Cholecystectomy open (03/31/2001).  ALLERGIES:  No Known Allergies  MEDICATIONS: Current facility-administered medications:0.9 %  sodium chloride infusion, , Intravenous, Once, Gerhard Munch, MD, Last Rate: 1,000 mL/hr at 02/14/12 2129;  0.9 %  sodium chloride infusion, ,  Intravenous, Continuous, Leroy Sea, MD;  acetaminophen (TYLENOL) suppository 650 mg, 650 mg, Rectal, Q6H PRN, Ron Parker, MD;  acetaminophen (TYLENOL) tablet 650 mg, 650 mg, Oral, Q6H PRN, Ron Parker, MD antiseptic oral rinse (BIOTENE) solution 15 mL, 15 mL, Mouth Rinse, q12n4p, Harvette Velora Heckler, MD;  chlorhexidine (PERIDEX) 0.12 % solution 15 mL, 15 mL, Mouth Rinse, BID, Ron Parker, MD, 15 mL at 02/15/12 0818;  enoxaparin (LOVENOX) injection 40 mg, 40 mg, Subcutaneous, Q24H, Harvette C Jenkins, MD;  HYDROmorphone (DILAUDID) injection 0.5-1 mg, 0.5-1 mg, Intravenous, Q3H PRN, Ron Parker, MD insulin aspart (novoLOG) injection 0-9 Units, 0-9 Units, Subcutaneous, Q6H, Leroy Sea, MD;  lidocaine (XYLOCAINE) 2 % jelly, , Other, Once, Gerhard Munch, MD, 20 application at 02/14/12 2129;  metoprolol (LOPRESSOR) injection 5 mg, 5 mg, Intravenous, Q4H PRN, Leroy Sea, MD;  morphine 4 MG/ML injection 2 mg, 2 mg, Intravenous, Once, Gerhard Munch, MD, 2 mg at 02/14/12 2129 ondansetron [facility]) injection 4 mg, 4 mg, Intravenous, Once, Gerhard Munch, MD, 4 mg at 02/14/12 2129;  ondansetron (ZOFRAN) injection 4 mg, 4 mg, Intravenous, Q6H PRN, Ron Parker, MD;  ondansetron (ZOFRAN) tablet 4 mg, 4 mg, Oral, Q6H PRN, Harvette C  Lovell Sheehan, MD;  zolpidem [facility]) tablet 5 mg, 5 mg, Oral, QHS PRN, Ron Parker, MD DISCONTD: 0.9 %  sodium chloride infusion, , Intravenous, STAT, Gerhard Munch, MD, Last Rate: 50 mL/hr at 02/15/12 0004;  DISCONTD: 0.9 %  sodium chloride infusion, , Intravenous, Continuous, Ron Parker, MD, Last Rate: 100 mL/hr at 02/15/12 0030;  DISCONTD: HYDROmorphone (DILAUDID) injection 0.5 mg, 0.5 mg, Intravenous, Q2H PRN, Gerhard Munch, MD, 0.5 mg at 02/14/12 2307 DISCONTD: ondansetron [facility]) injection 4 mg, 4 mg, Intravenous, Q8H PRN, Gerhard Munch, MD, 4 mg at 02/14/12 2307  ROS: He has a review of systems documented in the  admission note and not redictated here but reviewed.Marland Kitchen EXAM:   VITAL SIGNS: BP 129/81  Pulse 98  Temp 98.2 F (36.8 C) (Oral)  Resp 20  Ht 5\' 11"  (1.803 m)  Wt 162 lb 0.6 oz (73.5 kg)  BMI 22.60 kg/m2  SpO2 99%  GENERAL:  The patient is alert, oriented, and generally healthy-appearing, NAD. Mood and affect are normal.  HEENT:  The head is normocephalic, the eyes nonicteric, the pupils were round regular and equal. EOMs are normal. Pharynx normal.   NECK:  The neck is supple and there are no masses or thyromegaly.  LUNGS:  Normal respirations and clear to auscultation.  HEART:  Irregular  C/w A Fib No significant varicosities are noted.  ABDOMEN:  The abdomen is mildly distended but very soft. Completely nontender. There are multiple surgical scars present. He has a small ventral incisional hernia around the umbilicus area but it reduces easily. There is no evidence of an incarcerated hernia. He does not have inguinal hernias. Bowel sounds are present. Not particularly hyperactive at this time.  GU:  normal  EXTREMITIES:  Good range of motion, no edema.   DATA REVIEWED:  I have reviewed the original admission note, the x-rays have been done so for a long with the x-ray reports, and several of the old operative notes are contained in the medical record    Kalei Mckillop J 02/15/2012  CC: No ref. provider found, [name], MD

## 2012-02-16 ENCOUNTER — Inpatient Hospital Stay (HOSPITAL_COMMUNITY): Payer: Medicare Other

## 2012-02-16 LAB — MAGNESIUM: Magnesium: 1.7 mg/dL (ref 1.5–2.5)

## 2012-02-16 LAB — GLUCOSE, CAPILLARY
Glucose-Capillary: 110 mg/dL — ABNORMAL HIGH (ref 70–99)
Glucose-Capillary: 115 mg/dL — ABNORMAL HIGH (ref 70–99)
Glucose-Capillary: 125 mg/dL — ABNORMAL HIGH (ref 70–99)
Glucose-Capillary: 62 mg/dL — ABNORMAL LOW (ref 70–99)
Glucose-Capillary: 73 mg/dL (ref 70–99)
Glucose-Capillary: 93 mg/dL (ref 70–99)
Glucose-Capillary: 99 mg/dL (ref 70–99)

## 2012-02-16 LAB — BASIC METABOLIC PANEL
Chloride: 109 mEq/L (ref 96–112)
Creatinine, Ser: 1.04 mg/dL (ref 0.50–1.35)
GFR calc Af Amer: 73 mL/min — ABNORMAL LOW (ref >90)
Potassium: 3.7 mEq/L (ref 3.5–5.1)
Sodium: 140 mEq/L (ref 135–145)

## 2012-02-16 MED ORDER — DEXTROSE-NACL 5-0.45 % IV SOLN
INTRAVENOUS | Status: DC
  Administered 2012-02-17: 03:00:00 via INTRAVENOUS

## 2012-02-16 MED ORDER — GLUCOSE-VITAMIN C 4-6 GM-MG PO CHEW
CHEWABLE_TABLET | ORAL | Status: AC
  Filled 2012-02-16: qty 1

## 2012-02-16 MED ORDER — DILTIAZEM HCL ER COATED BEADS 240 MG PO TB24
240.0000 mg | ORAL_TABLET | Freq: Every day | ORAL | Status: DC
  Administered 2012-02-16 – 2012-02-18 (×3): 240 mg via ORAL
  Filled 2012-02-16 (×3): qty 1

## 2012-02-16 MED ORDER — MAGNESIUM SULFATE IN D5W 10-5 MG/ML-% IV SOLN
1.0000 g | Freq: Once | INTRAVENOUS | Status: AC
  Administered 2012-02-16: 1 g via INTRAVENOUS
  Filled 2012-02-16: qty 100

## 2012-02-16 MED ORDER — GLUCOSE 4 G PO CHEW
1.0000 | CHEWABLE_TABLET | Freq: Four times a day (QID) | ORAL | Status: DC
  Administered 2012-02-16 – 2012-02-17 (×3): 4 g via ORAL
  Filled 2012-02-16 (×8): qty 1

## 2012-02-16 MED ORDER — METOPROLOL TARTRATE 25 MG PO TABS
25.0000 mg | ORAL_TABLET | Freq: Two times a day (BID) | ORAL | Status: DC
  Administered 2012-02-16 – 2012-02-18 (×5): 25 mg via ORAL
  Filled 2012-02-16 (×6): qty 1

## 2012-02-16 MED ORDER — DEXTROSE 50 % IV SOLN
INTRAVENOUS | Status: AC
  Filled 2012-02-16: qty 50

## 2012-02-16 MED ORDER — DEXTROSE 50 % IV SOLN
25.0000 mL | Freq: Once | INTRAVENOUS | Status: AC | PRN
  Administered 2012-02-16: 25 mL via INTRAVENOUS

## 2012-02-16 NOTE — Progress Notes (Signed)
[facility]                                                                                Patient Demographics  [name], is a 78 y.o. male  BJY:[id number]  YQM:[id number]  DOB - May 25, 1923  Admit date - 02/14/2012  Admitting Physician Ron Parker, MD  Outpatient Primary MD for the patient is [name], MD  LOS - 2   Chief Complaint  Patient presents with  . Abdominal Pain        Assessment & Plan    1. Recurrent small bowel obstruction in a patient with multiple previous abdominal surgeries and history of adhesions-  patient is now passing flatus, minimal return from NG, sips of water to be started on 02/16/2012, NG will be discontinued on 9-29 per surgery.   2. History of tachybradycardia syndrome, paroxysmal A. fib, cardiac pacemaker-will place him on telemetry, in no acute issues, goal will be rate controlled, unable to take his Cardizem, Coumadin and Lopressor, will place him on when necessary IV Lopressor.  Lab Results  Component Value Date   INR 1.23 02/14/2012   INR 2.30* 06/13/2010   INR 2.18* 06/12/2010     3. History of diabetes mellitus type 2-no acute issues every 4 Accu-Cheks with sliding scale low dose.  Lab Results  Component Value Date   HGBA1C 6.5* 02/15/2012   CBG (last 3)   Basename 02/16/12 0524 02/16/12 0023 02/15/12 1808  GLUCAP 125* 115* 75      4. Hypertension-no acute issues, unable to take by mouth medications, when necessary IV Lopressor.    Code Status: Full  Family Communication: D/W the patient  Disposition Plan: To be decided    Procedures KUB, NG   Consults  neurosurgery   Time Spent in minutes  35   Antibiotics    Anti-infectives    None      Scheduled Meds:    . antiseptic oral rinse  15 mL Mouth Rinse q12n4p  . chlorhexidine  15 mL Mouth Rinse BID  . enoxaparin (LOVENOX) injection  40 mg Subcutaneous Q24H  . insulin aspart  0-9 Units Subcutaneous Q6H    Continuous Infusions:    . dextrose 5 % and 0.45% NaCl 50 mL/hr at 02/16/12 0830  . DISCONTD: sodium chloride 75 mL/hr at 02/15/12 1026  . DISCONTD: dextrose 5 % and 0.45% NaCl 75 mL/hr at 02/16/12 0645   PRN Meds:.acetaminophen, acetaminophen, HYDROmorphone (DILAUDID) injection, metoprolol, ondansetron (ZOFRAN) IV, ondansetron, zolpidem   DVT Prophylaxis  Lovenox     Leroy Sea M.D on 02/16/2012 at 10:52 AM  Between 7am to 7pm - Pager - [phone]  After 7pm go to [email or web address] - password [email or web address]  And look for the night coverage person covering for me after hours  [facility] Office  [phone]    Subjective:   [name] today has, No headache, No chest pain, No abdominal pain - No Nausea, No new weakness tingling or numbness, No Cough - SOB.   Objective:   Filed Vitals:   02/15/12 2230 02/16/12 0214 02/16/12 0525 02/16/12 1017  BP: 157/90 137/78 128/77 131/81  Pulse: 68 93 77 92  Temp: 98.6 F (37 C) 98.7 F (37.1 C) 98.3 F (36.8 C) 98.4 F (36.9 C)  TempSrc: Oral Oral Oral Oral  Resp: 18 18 18 20   Height:      Weight:      SpO2: 98% 97% 98% 99%    Wt Readings from Last 3 Encounters:  02/15/12 73.5 kg (162 lb 0.6 oz)  04/18/11 78.654 kg (173 lb 6.4 oz)  03/13/10 76.204 kg (168 lb)     Intake/Output Summary (Last 24 hours) at 02/16/12 1052 Last data filed at 02/16/12 1019  Gross per 24 hour  Intake 1231.25 ml  Output   1175 ml  Net  56.25 ml    Exam Awake Alert, Oriented X 3, No new F.N deficits, Normal affect [place].AT,PERRAL Supple Neck,No JVD, No cervical lymphadenopathy appriciated.  Symmetrical Chest wall movement, Good air movement bilaterally, CTAB RRR,No Gallops,Rubs or new Murmurs, No Parasternal Heave Hypoactive B.Sounds, Abd Soft, Non tender, No organomegaly appriciated, No rebound - guarding or rigidity.  small reducible ventral hernia, large previous abdominal surgery scar stable No Cyanosis, Clubbing or  edema, No new Rash or bruise     Data Review   Micro Results No results found for this or any previous visit (from the past 240 hour(s)).  Radiology Reports Dg Abd Acute W/chest  02/14/2012  *RADIOLOGY REPORT*  Clinical Data: Pain.  ACUTE ABDOMEN SERIES (ABDOMEN 2 VIEW & CHEST 1 VIEW)  Comparison: 06/11/2010.  Findings: Trachea is midline.  Heart size stable.  An air-filled hiatal hernia is moderate in size.  Pacemaker lead tips project over the right atrium and right ventricle.  Lungs are clear.  No pleural fluid.  Two views of the abdomen show multiple air-fluid levels and dilated loops of small bowel.  Dilated small bowel measures up to 6.1 cm. There is likely minimal colonic gas in the right abdomen.  Surgical changes are seen in the anatomic pelvis.  IMPRESSION: High-grade small bowel obstruction.   Original Report Authenticated By: Reyes Ivan, M.D.    Dg Abd Portable 1v  02/15/2012  *RADIOLOGY REPORT*  Clinical Data: Small bowel obstruction.  PORTABLE ABDOMEN - 1 VIEW  Comparison: Abdominal radiograph 02/14/2012.  Findings: There is a paucity of distal colonic and rectal gas. Numerous dilated loops of small bowel are again noted projecting over the central abdomen and left upper quadrant, measuring up to 5.3 cm in diameter.  No definite pneumoperitoneum identified on this single supine view of the abdomen.  IMPRESSION: 1.  Findings remain consistent with small bowel obstruction, as above.   Original Report Authenticated By: Florencia Reasons, M.D.    Dg Abd Portable 1v  02/14/2012  *RADIOLOGY REPORT*  Clinical Data: Abdominal pain  PORTABLE ABDOMEN - 1 VIEW  Comparison: 02/14/2012 at 1914 hours  Findings: Again seen are dilated loops of small bowel in the left mid abdomen, worrisome for small bowel obstruction.  IMPRESSION: Dilated loops of small bowel in the left mid abdomen, worrisome for small bowel obstruction, unchanged.   Original Report Authenticated By: Charline Bills, M.D.      CBC  Lab 02/15/12 0706 02/14/12 2103  WBC 5.7 11.0*  HGB 10.9* 13.9  HCT 32.5* 41.2  PLT 127* 162  MCV 75.1* 74.9*  MCH 25.2* 25.3*  MCHC 33.5 33.7  RDW 15.6* 15.5  LYMPHSABS -- 2.6  MONOABS -- 1.0  EOSABS -- 0.1  BASOSABS -- 0.0  BANDABS -- --    Chemistries   Lab 02/16/12 0910 02/15/12 0706 02/14/12  2103  NA 140 140 139  K 3.7 4.6 4.0  CL 109 108 100  CO2 24 24 22   GLUCOSE 103* 105* 148*  BUN 9 14 15   CREATININE 1.04 1.35 1.48*  CALCIUM 8.6 8.3* 10.2  MG 1.7 -- --  AST -- -- 26  ALT -- -- 10  ALKPHOS -- -- 98  BILITOT -- -- 0.3   ------------------------------------------------------------------------------------------------------------------ estimated creatinine clearance is 54 ml/min (by C-G formula based on Cr of 1.04). ------------------------------------------------------------------------------------------------------------------  Basename 02/15/12 1010  HGBA1C 6.5*   ------------------------------------------------------------------------------------------------------------------ No results found for this basename: CHOL:2,HDL:2,LDLCALC:2,TRIG:2,CHOLHDL:2,LDLDIRECT:2 in the last 72 hours ------------------------------------------------------------------------------------------------------------------ No results found for this basename: TSH,T4TOTAL,FREET3,T3FREE,THYROIDAB in the last 72 hours ------------------------------------------------------------------------------------------------------------------ No results found for this basename: VITAMINB12:2,FOLATE:2,FERRITIN:2,TIBC:2,IRON:2,RETICCTPCT:2 in the last 72 hours  Coagulation profile  Lab 02/14/12 2115  INR 1.23  PROTIME --    No results found for this basename: DDIMER:2 in the last 72 hours  Cardiac Enzymes  Lab 02/14/12 2115  CKMB --  TROPONINI <0.30  MYOGLOBIN --    ------------------------------------------------------------------------------------------------------------------ No components found with this basename: POCBNP:3

## 2012-02-16 NOTE — Progress Notes (Signed)
SBO (small bowel obstruction)  Assessment: SBO (small bowel obstruction) Resolving partial small bowel obstruction, likely secondary to adhesions  Plan: I believe we can DC his NG tube and let him have sips of liquids today   Subjective: He feels much better today. He is not having any pain. He feels less bloated. He has passed gas. He has not had a bowel movement yet.  Objective: Vital signs in last 24 hours: Temp:  [98.2 F (36.8 C)-98.7 F (37.1 C)] 98.3 F (36.8 C) (09/29 0525) Pulse Rate:  [64-106] 77  (09/29 0525) Resp:  [16-18] 18  (09/29 0525) BP: (120-157)/(72-102) 128/77 mmHg (09/29 0525) SpO2:  [97 %-100 %] 98 % (09/29 0525) Last BM Date: 02/14/12  Intake/Output from previous day: 09/28 0701 - 09/29 0700 In: 1774.6 [I.V.:1774.6] Out: 1400 [Urine:1050; Emesis/NG output:350] Intake/Output this shift:    General appearance: alert, appears stated age and no distress Resp: clear to auscultation bilaterally Cardio: irregularly irregular rhythm GI: the abdomen is clearly less distended than yesterday. There is no tenderness. There is no guarding. No organomegaly is noted. Bowel sounds are present.  Lab Results:  Results for orders placed during the hospital encounter of 02/14/12 (from the past 24 hour(s))  HEMOGLOBIN A1C     Status: Abnormal   Collection Time   02/15/12 10:10 AM      Component Value Range   Hemoglobin A1C 6.5 (*) <5.7 %   Mean Plasma Glucose 140 (*) <117 mg/dL  GLUCOSE, CAPILLARY     Status: Normal   Collection Time   02/15/12 11:58 AM      Component Value Range   Glucose-Capillary 99  70 - 99 mg/dL  GLUCOSE, CAPILLARY     Status: Normal   Collection Time   02/15/12  6:08 PM      Component Value Range   Glucose-Capillary 75  70 - 99 mg/dL  GLUCOSE, CAPILLARY     Status: Abnormal   Collection Time   02/16/12 12:23 AM      Component Value Range   Glucose-Capillary 115 (*) 70 - 99 mg/dL  GLUCOSE, CAPILLARY     Status: Abnormal   Collection  Time   02/16/12  5:24 AM      Component Value Range   Glucose-Capillary 125 (*) 70 - 99 mg/dL     Studies/Results Radiology X-ray shows improvement in the pattern of small bowel obstruction with less dilated bowel and air in his colon.    MEDS, Scheduled    . antiseptic oral rinse  15 mL Mouth Rinse q12n4p  . chlorhexidine  15 mL Mouth Rinse BID  . enoxaparin (LOVENOX) injection  40 mg Subcutaneous Q24H  . insulin aspart  0-9 Units Subcutaneous Q6H       LOS: 2 days    Currie Paris, MD, [facility], [place] [phone]   02/16/2012 9:59 AM

## 2012-02-16 NOTE — Progress Notes (Signed)
Hypoglycemic Event; cbg 62 @1200   Treatment: D50 25ml at 12:25  Symptoms: none  Follow-up CBG: Time:12:40 CBG Result:73  Possible Reasons for Event: npo  Comments/MD notified:orders received    Yvone Neu K  Remember to initiate Hypoglycemia Order Set & complete

## 2012-02-17 ENCOUNTER — Inpatient Hospital Stay (HOSPITAL_COMMUNITY): Payer: Medicare Other

## 2012-02-17 ENCOUNTER — Ambulatory Visit (INDEPENDENT_AMBULATORY_CARE_PROVIDER_SITE_OTHER): Payer: Medicare Other | Admitting: *Deleted

## 2012-02-17 DIAGNOSIS — R112 Nausea with vomiting, unspecified: Secondary | ICD-10-CM

## 2012-02-17 DIAGNOSIS — Z950 Presence of cardiac pacemaker: Secondary | ICD-10-CM

## 2012-02-17 DIAGNOSIS — I4891 Unspecified atrial fibrillation: Secondary | ICD-10-CM

## 2012-02-17 LAB — BASIC METABOLIC PANEL
CO2: 27 mEq/L (ref 19–32)
Chloride: 105 mEq/L (ref 96–112)
Creatinine, Ser: 1.06 mg/dL (ref 0.50–1.35)
Glucose, Bld: 150 mg/dL — ABNORMAL HIGH (ref 70–99)

## 2012-02-17 LAB — MAGNESIUM: Magnesium: 1.9 mg/dL (ref 1.5–2.5)

## 2012-02-17 LAB — GLUCOSE, CAPILLARY: Glucose-Capillary: 139 mg/dL — ABNORMAL HIGH (ref 70–99)

## 2012-02-17 MED ORDER — HEPARIN (PORCINE) IN NACL 100-0.45 UNIT/ML-% IJ SOLN
850.0000 [IU]/h | INTRAMUSCULAR | Status: DC
  Filled 2012-02-17: qty 250

## 2012-02-17 MED ORDER — HEPARIN (PORCINE) IN NACL 100-0.45 UNIT/ML-% IJ SOLN
1100.0000 [IU]/h | INTRAMUSCULAR | Status: DC
  Administered 2012-02-17 (×2): 1100 [IU]/h via INTRAVENOUS
  Filled 2012-02-17: qty 250

## 2012-02-17 MED ORDER — POTASSIUM CHLORIDE 10 MEQ/100ML IV SOLN
10.0000 meq | INTRAVENOUS | Status: AC
  Administered 2012-02-17 (×3): 10 meq via INTRAVENOUS
  Filled 2012-02-17: qty 300

## 2012-02-17 MED ORDER — GLUCOSE 4 G PO CHEW: 1.0000 | CHEWABLE_TABLET | Freq: Two times a day (BID) | ORAL | Status: DC

## 2012-02-17 MED ORDER — DEXTROSE-NACL 5-0.45 % IV SOLN
INTRAVENOUS | Status: AC
  Administered 2012-02-17 – 2012-02-18 (×2): via INTRAVENOUS

## 2012-02-17 MED ORDER — HEPARIN BOLUS VIA INFUSION
4000.0000 [IU] | Freq: Once | INTRAVENOUS | Status: AC
  Administered 2012-02-17: 4000 [IU] via INTRAVENOUS
  Filled 2012-02-17: qty 4000

## 2012-02-17 MED ORDER — BISACODYL 10 MG RE SUPP
10.0000 mg | Freq: Once | RECTAL | Status: AC
  Administered 2012-02-17: 10 mg via RECTAL
  Filled 2012-02-17: qty 1

## 2012-02-17 MED ORDER — SORBITOL 70 % SOLN
960.0000 mL | TOPICAL_OIL | Freq: Once | ORAL | Status: AC
  Administered 2012-02-17: 960 mL via RECTAL
  Filled 2012-02-17: qty 240

## 2012-02-17 MED ORDER — GLUCOSE-VITAMIN C 4-6 GM-MG PO CHEW
1.0000 | CHEWABLE_TABLET | Freq: Two times a day (BID) | ORAL | Status: DC
  Administered 2012-02-17 – 2012-02-18 (×3): 1 via ORAL
  Filled 2012-02-17 (×2): qty 1

## 2012-02-17 NOTE — Progress Notes (Signed)
General surgery attending note:.  Patient interviewed and examined. Agree with [name]'s assessment and treatment plan. Patient has not had a bowel movement but is passing flatus, as the pain and is tolerating sips of clear liquids.  Exam shows a slightly distended abdomen, reducible ventral hernia, nontender.  X-rays show partial small bowel obstruction and moderate  stool in the colon  Will give an enema to see if that helps. reassess  Tomorrow.   [name]. Derrell Lolling, M.D., [facility] General and Minimally invasive Surgery Breast and Colorectal Surgery Office:   [phone] Pager:   [phone]

## 2012-02-17 NOTE — Progress Notes (Signed)
ANTICOAGULATION CONSULT NOTE - Initial Consult  Pharmacy Consult for heparin Indication: atrial fibrillation,  Bridge therapy while coumadin on hold for SBO  No Known Allergies  Patient Measurements: Height: 5\' 11"  (180.3 cm) Weight: 162 lb 0.6 oz (73.5 kg) IBW/kg (Calculated) : 75.3  Heparin Dosing Weight: 73.5  Vital Signs: Temp: 97.5 F (36.4 C) (09/30 1112) Temp src: Oral (09/30 1112) BP: 125/90 mmHg (09/30 1112) Pulse Rate: 65  (09/30 1112)  Labs:  Basename 02/17/12 0520 02/16/12 0910 02/15/12 0706 02/14/12 2115 02/14/12 2103  HGB -- -- 10.9* -- 13.9  HCT -- -- 32.5* -- 41.2  PLT -- -- 127* -- 162  APTT -- -- -- -- --  LABPROT -- -- -- 15.3* --  INR -- -- -- 1.23 --  HEPARINUNFRC -- -- -- -- --  CREATININE 1.06 1.04 1.35 -- --  CKTOTAL -- -- -- -- --  CKMB -- -- -- -- --  TROPONINI -- -- -- <0.30 --    Estimated Creatinine Clearance: 53 ml/min (by C-G formula based on Cr of 1.06).   Medical History: Past Medical History  Diagnosis Date  . Tachycardia-bradycardia syndrome   . Atrial fibrillation   . Hypertension   . Diabetes mellitus   . Hx SBO   . Ventral hernia   . Prostate cancer     Medications:  Prescriptions prior to admission  Medication Sig Dispense Refill  . diltiazem (CARDIZEM LA) 240 MG 24 hr tablet Take 240 mg by mouth daily.        . metoprolol tartrate (LOPRESSOR) 25 MG tablet Take 25 mg by mouth 2 (two) times daily.        Marland Kitchen olmesartan-hydrochlorothiazide (BENICAR HCT) 40-12.5 MG per tablet Take 1 tablet by mouth daily.        . Tamsulosin HCl (FLOMAX) 0.4 MG CAPS take 1 capsule by mouth at bedtime  30 capsule  5  . warfarin (COUMADIN) 5 MG tablet Take 2.5 mg by mouth daily.        Assessment: 78 yo man with recurrent pSBO with multiple previous abdominal surgeries.  He was on coumadin PTA for PAF. His home dose is 2.5 mg po daily.  Coumadin is on hold 2nd he is NPO for SBO.  His INR on admission was 1.23.Marland Kitchen   Goal of Therapy:  Heparin  level 0.3-0.7 units/ml Monitor platelets by anticoagulation protocol: Yes   Plan:  1. Heparin bolus 4000 units IV x1 2. Heparin drip at 1100 units/hr 3. DC LMWH 40 qday 4. Heparin level 8 hrs after bolus 5. Daily HL and CBC while on heparin Herby Abraham, Pharm.D. [phone] 02/17/2012 12:42 PM

## 2012-02-17 NOTE — Progress Notes (Signed)
[facility]                                                                                Patient Demographics  [name], is a 78 y.o. male  WUJ:[id number]  NFA:[id number]  DOB - 1923-06-29  Admit date - 02/14/2012  Admitting Physician Ron Parker, MD  Outpatient Primary MD for the patient is [name], MD  LOS - 3   Chief Complaint  Patient presents with  . Abdominal Pain        Assessment & Plan    1. Recurrent small bowel obstruction in a patient with multiple previous abdominal surgeries and history of adhesions-  down her pain better not nauseated, NG was discontinued on 9-29 per surgery. KUB still shows evidence of small bowel obstruction, for now only ice chips, surgery following. Patient is passing some flatus.    2. History of tachybradycardia syndrome, paroxysmal A. fib, cardiac pacemaker-will place him on telemetry, in no acute issues, goal will be rate controlled, a stem on his oral Cardizem and Lopressor with a sip of water along with when necessary IV Lopressor with written parameters, continue to hold Coumadin. Place him on heparin as this is now day 3 of his SBO and we're continuing to hold Coumadin.  Lab Results  Component Value Date   INR 1.23 02/14/2012   INR 2.30* 06/13/2010   INR 2.18* 06/12/2010      3. History of diabetes mellitus type 2-no acute issues every 4 Accu-Cheks with sliding scale low dose.  Lab Results  Component Value Date   HGBA1C 6.5* 02/15/2012   CBG (last 3)   Basename 02/17/12 1205 02/17/12 0507 02/17/12 0302  GLUCAP 212* 151* 137*     4. Hypertension-no acute issues, unable to take by mouth medications, when necessary IV Lopressor.    Code Status: Full  Family Communication: D/W the patient  Disposition Plan: To be decided    Procedures KUB, NG   Consults  neurosurgery   Time Spent in minutes  35   Antibiotics    Anti-infectives    None      Scheduled Meds:      . antiseptic oral rinse  15 mL Mouth Rinse q12n4p  . bisacodyl  10 mg Rectal Once  . chlorhexidine  15 mL Mouth Rinse BID  . dextrose      . diltiazem  240 mg Oral Daily  . enoxaparin (LOVENOX) injection  40 mg Subcutaneous Q24H  . glucose-Vitamin C  1 tablet Oral BID  . glucose-Vitamin C      . insulin aspart  0-9 Units Subcutaneous Q6H  . magnesium sulfate 1 - 4 g bolus IVPB  1 g Intravenous Once  . metoprolol tartrate  25 mg Oral BID  . potassium chloride  10 mEq Intravenous Q1 Hr x 3  . DISCONTD: glucose  1 tablet Oral Q6H  . DISCONTD: glucose  1 tablet Oral BID   Continuous Infusions:    . dextrose 5 % and 0.45% NaCl 75 mL/hr at 02/17/12 0940  . DISCONTD: dextrose 5 % and 0.45% NaCl 50 mL/hr at 02/17/12 0302   PRN Meds:.acetaminophen, acetaminophen, dextrose, HYDROmorphone (DILAUDID)  injection, metoprolol, ondansetron (ZOFRAN) IV, ondansetron, zolpidem   DVT Prophylaxis  Lovenox     Susa Raring K M.D on 02/17/2012 at 12:23 PM  Between 7am to 7pm - Pager - [phone]  After 7pm go to [email or web address] - password [email or web address]  And look for the night coverage person covering for me after hours  [facility] Office  [phone]    Subjective:   [name] today has, No headache, No chest pain, No abdominal pain - No Nausea, No new weakness tingling or numbness, No Cough - SOB.   Objective:   Filed Vitals:   02/16/12 2110 02/17/12 0223 02/17/12 0508 02/17/12 1112  BP: 136/78 116/64 115/71 125/90  Pulse: 85 66 64 65  Temp: 98.2 F (36.8 C) 98.5 F (36.9 C) 97.9 F (36.6 C) 97.5 F (36.4 C)  TempSrc: Oral Oral Oral Oral  Resp: 18 18 18 18   Height:      Weight:      SpO2: 96% 99% 99% 100%    Wt Readings from Last 3 Encounters:  02/15/12 73.5 kg (162 lb 0.6 oz)  04/18/11 78.654 kg (173 lb 6.4 oz)  03/13/10 76.204 kg (168 lb)     Intake/Output Summary (Last 24 hours) at 02/17/12 1223 Last data filed at 02/17/12 0757  Gross per 24 hour   Intake    325 ml  Output   1300 ml  Net   -975 ml    Exam Awake Alert, Oriented X 3, No new F.N deficits, Normal affect [place].AT,PERRAL Supple Neck,No JVD, No cervical lymphadenopathy appriciated.  Symmetrical Chest wall movement, Good air movement bilaterally, CTAB RRR,No Gallops,Rubs or new Murmurs, No Parasternal Heave Hypoactive B.Sounds, Abd Soft, Non tender, No organomegaly appriciated, No rebound - guarding or rigidity.  small reducible ventral hernia, large previous abdominal surgery scar stable No Cyanosis, Clubbing or edema, No new Rash or bruise     Data Review   Micro Results No results found for this or any previous visit (from the past 240 hour(s)).  Radiology Reports Dg Abd Acute W/chest  02/14/2012  *RADIOLOGY REPORT*  Clinical Data: Pain.  ACUTE ABDOMEN SERIES (ABDOMEN 2 VIEW & CHEST 1 VIEW)  Comparison: 06/11/2010.  Findings: Trachea is midline.  Heart size stable.  An air-filled hiatal hernia is moderate in size.  Pacemaker lead tips project over the right atrium and right ventricle.  Lungs are clear.  No pleural fluid.  Two views of the abdomen show multiple air-fluid levels and dilated loops of small bowel.  Dilated small bowel measures up to 6.1 cm. There is likely minimal colonic gas in the right abdomen.  Surgical changes are seen in the anatomic pelvis.  IMPRESSION: High-grade small bowel obstruction.   Original Report Authenticated By: Reyes Ivan, M.D.    Dg Abd Portable 1v  02/15/2012  *RADIOLOGY REPORT*  Clinical Data: Small bowel obstruction.  PORTABLE ABDOMEN - 1 VIEW  Comparison: Abdominal radiograph 02/14/2012.  Findings: There is a paucity of distal colonic and rectal gas. Numerous dilated loops of small bowel are again noted projecting over the central abdomen and left upper quadrant, measuring up to 5.3 cm in diameter.  No definite pneumoperitoneum identified on this single supine view of the abdomen.  IMPRESSION: 1.  Findings remain consistent with  small bowel obstruction, as above.   Original Report Authenticated By: Florencia Reasons, M.D.    Dg Abd Portable 1v  02/14/2012  *RADIOLOGY REPORT*  Clinical Data: Abdominal pain  PORTABLE ABDOMEN - 1 VIEW  Comparison: 02/14/2012 at 1914 hours  Findings: Again seen are dilated loops of small bowel in the left mid abdomen, worrisome for small bowel obstruction.  IMPRESSION: Dilated loops of small bowel in the left mid abdomen, worrisome for small bowel obstruction, unchanged.   Original Report Authenticated By: Charline Bills, M.D.     CBC  Lab 02/15/12 0706 02/14/12 2103  WBC 5.7 11.0*  HGB 10.9* 13.9  HCT 32.5* 41.2  PLT 127* 162  MCV 75.1* 74.9*  MCH 25.2* 25.3*  MCHC 33.5 33.7  RDW 15.6* 15.5  LYMPHSABS -- 2.6  MONOABS -- 1.0  EOSABS -- 0.1  BASOSABS -- 0.0  BANDABS -- --    Chemistries   Lab 02/17/12 0520 02/16/12 0910 02/15/12 0706 02/14/12 2103  NA 140 140 140 139  K 3.4* 3.7 4.6 4.0  CL 105 109 108 100  CO2 27 24 24 22   GLUCOSE 150* 103* 105* 148*  BUN 6 9 14 15   CREATININE 1.06 1.04 1.35 1.48*  CALCIUM 8.7 8.6 8.3* 10.2  MG 1.9 1.7 -- --  AST -- -- -- 26  ALT -- -- -- 10  ALKPHOS -- -- -- 98  BILITOT -- -- -- 0.3   ------------------------------------------------------------------------------------------------------------------ estimated creatinine clearance is 53 ml/min (by C-G formula based on Cr of 1.06). ------------------------------------------------------------------------------------------------------------------  Basename 02/15/12 1010  HGBA1C 6.5*   ------------------------------------------------------------------------------------------------------------------ No results found for this basename: CHOL:2,HDL:2,LDLCALC:2,TRIG:2,CHOLHDL:2,LDLDIRECT:2 in the last 72 hours ------------------------------------------------------------------------------------------------------------------ No results found for this basename:  TSH,T4TOTAL,FREET3,T3FREE,THYROIDAB in the last 72 hours ------------------------------------------------------------------------------------------------------------------ No results found for this basename: VITAMINB12:2,FOLATE:2,FERRITIN:2,TIBC:2,IRON:2,RETICCTPCT:2 in the last 72 hours  Coagulation profile  Lab 02/14/12 2115  INR 1.23  PROTIME --    No results found for this basename: DDIMER:2 in the last 72 hours  Cardiac Enzymes  Lab 02/14/12 2115  CKMB --  TROPONINI <0.30  MYOGLOBIN --   ------------------------------------------------------------------------------------------------------------------ No components found with this basename: POCBNP:3

## 2012-02-17 NOTE — Progress Notes (Signed)
Patient ID: [name], male   DOB: 14-Oct-1923, 78 y.o.   MRN: [id number]    Subjective: Pt continues to feel well with NGT out.  Had sips of liquids with no nausea.  Passing flatus, but no BM  Objective: Vital signs in last 24 hours: Temp:  [97.8 F (36.6 C)-98.5 F (36.9 C)] 97.9 F (36.6 C) (09/30 0508) Pulse Rate:  [64-92] 64  (09/30 0508) Resp:  [18-20] 18  (09/30 0508) BP: (115-151)/(64-81) 115/71 mmHg (09/30 0508) SpO2:  [96 %-100 %] 99 % (09/30 0508) Last BM Date: 02/14/12  Intake/Output from previous day: 09/29 0701 - 09/30 0700 In: 325 [I.V.:325] Out: 1725 [Urine:1700; Emesis/NG output:25] Intake/Output this shift: Total I/O In: -  Out: 325 [Urine:325]  PE: Abd: soft, NT, ND, +BS  Lab Results:   [facility] 02/15/12 0706 02/14/12 2103  WBC 5.7 11.0*  HGB 10.9* 13.9  HCT 32.5* 41.2  PLT 127* 162   BMET  Basename 02/17/12 0520 02/16/12 0910  NA 140 140  K 3.4* 3.7  CL 105 109  CO2 27 24  GLUCOSE 150* 103*  BUN 6 9  CREATININE 1.06 1.04  CALCIUM 8.7 8.6   PT/INR  Basename 02/14/12 2115  LABPROT 15.3*  INR 1.23   CMP     Component Value Date/Time   NA 140 02/17/2012 0520   K 3.4* 02/17/2012 0520   CL 105 02/17/2012 0520   CO2 27 02/17/2012 0520   GLUCOSE 150* 02/17/2012 0520   BUN 6 02/17/2012 0520   CREATININE 1.06 02/17/2012 0520   CALCIUM 8.7 02/17/2012 0520   PROT 8.5* 02/14/2012 2103   ALBUMIN 4.6 02/14/2012 2103   AST 26 02/14/2012 2103   ALT 10 02/14/2012 2103   ALKPHOS 98 02/14/2012 2103   BILITOT 0.3 02/14/2012 2103   GFRNONAA 62* 02/17/2012 0520   GFRAA 72* 02/17/2012 0520   Lipase     Component Value Date/Time   LIPASE 22 02/08/2009 2105       Studies/Results: Dg Abd Acute W/chest  02/17/2012  *RADIOLOGY REPORT*  Clinical Data: Small bowel obstruction.  ACUTE ABDOMEN SERIES (ABDOMEN 2 VIEW & CHEST 1 VIEW)  Comparison: 02/16/2012 and 02/14/2012.  Findings: Frontal view of the chest shows midline trachea normal heart size.   Pacemaker lead tips are stable in position.  Hiatal hernia again noted.  Tiny bilateral pleural effusions.  Lungs are otherwise clear.  Nipple shadows project over the lower hemithoraces.  Persistent dilated loops of small bowel with associated air fluid levels.  Some colonic gas and stool are seen in the left abdomen.  IMPRESSION:  1.  Persistent small bowel obstruction. 2.  Tiny bilateral pleural effusions.   Original Report Authenticated By: Reyes Ivan, M.D.    Dg Abd Portable 1v  02/16/2012  *RADIOLOGY REPORT*  Clinical Data: SBO  PORTABLE ABDOMEN - 1 VIEW  Comparison: 02/07/2012  Findings: Mildly prominent loops of small bowel in the left mid abdomen, although improved.  Colon is no longer decompressed.  Degenerative changes of the visualized thoracolumbar spine.  IMPRESSION: Mildly prominent loops of small bowel in the left mid abdomen, improved, likely reflecting resolving small bowel obstruction.   Original Report Authenticated By: Charline Bills, M.D.     Anti-infectives: Anti-infectives    None       Assessment/Plan  1. PSBO  Plan: 1. X-rays still show a PSBO, but patient is clinically improving.  Will try clear liquids and give a suppository, given decent amount of stool seen  in colon.   LOS: 3 days    Kincaid Tiger E 02/17/2012, 9:21 AM Pager: [phone]

## 2012-02-17 NOTE — Progress Notes (Signed)
ANTICOAGULATION CONSULT NOTE - Follow Up Consult  Pharmacy Consult for heparin Indication: atrial fibrillation,  Bridge therapy while coumadin on hold for SBO  No Known Allergies  Patient Measurements: Height: 5\' 11"  (180.3 cm) Weight: 162 lb 0.6 oz (73.5 kg) IBW/kg (Calculated) : 75.3  Heparin Dosing Weight: 73.5  Vital Signs: Temp: 98.1 F (36.7 C) (09/30 2148) Temp src: Oral (09/30 2148) BP: 133/71 mmHg (09/30 2148) Pulse Rate: 65  (09/30 2148)  Labs:  Alvira Philips 02/17/12 2207 02/17/12 0520 02/16/12 0910 02/15/12 0706  HGB -- -- -- 10.9*  HCT -- -- -- 32.5*  PLT -- -- -- 127*  APTT -- -- -- --  LABPROT -- -- -- --  INR -- -- -- --  HEPARINUNFRC 1.34* -- -- --  CREATININE -- 1.06 1.04 1.35  CKTOTAL -- -- -- --  CKMB -- -- -- --  TROPONINI -- -- -- --    Estimated Creatinine Clearance: 53 ml/min (by C-G formula based on Cr of 1.06).   Medical History: Past Medical History  Diagnosis Date  . Tachycardia-bradycardia syndrome   . Atrial fibrillation   . Hypertension   . Diabetes mellitus   . Hx SBO   . Ventral hernia   . Prostate cancer     Medications:  Prescriptions prior to admission  Medication Sig Dispense Refill  . diltiazem (CARDIZEM LA) 240 MG 24 hr tablet Take 240 mg by mouth daily.        . metoprolol tartrate (LOPRESSOR) 25 MG tablet Take 25 mg by mouth 2 (two) times daily.        Marland Kitchen olmesartan-hydrochlorothiazide (BENICAR HCT) 40-12.5 MG per tablet Take 1 tablet by mouth daily.        . Tamsulosin HCl (FLOMAX) 0.4 MG CAPS take 1 capsule by mouth at bedtime  30 capsule  5  . warfarin (COUMADIN) 5 MG tablet Take 2.5 mg by mouth daily.        Assessment: 78 yo man with recurrent pSBO with multiple previous abdominal surgeries.  He was on coumadin PTA for PAF. His home dose is 2.5 mg po daily.  Coumadin is on hold 2nd he is NPO for SBO.  His INR on admission was 1.23.  Heparin level (1.34) is above-goal on 1100 units/hr. Confirmed with RN that lab  was drawn appropriately.   Goal of Therapy:  Heparin level 0.3-0.7 units/ml Monitor platelets by anticoagulation protocol: Yes   Plan:  1. Hold heparin x 1 hour.  2. Restart heparin infusion at 850 units/hr.  3. Heparin level 8 hours after restart.   Lorre Munroe, PharmD 02/17/2012 10:58 PM

## 2012-02-18 ENCOUNTER — Inpatient Hospital Stay (HOSPITAL_COMMUNITY): Payer: Medicare Other

## 2012-02-18 DIAGNOSIS — I495 Sick sinus syndrome: Secondary | ICD-10-CM

## 2012-02-18 LAB — BASIC METABOLIC PANEL
CO2: 25 mEq/L (ref 19–32)
Calcium: 8.8 mg/dL (ref 8.4–10.5)
Chloride: 106 mEq/L (ref 96–112)
GFR calc Af Amer: 76 mL/min — ABNORMAL LOW (ref >90)
Sodium: 138 mEq/L (ref 135–145)

## 2012-02-18 LAB — CBC
HCT: 31.6 % — ABNORMAL LOW (ref 39.0–52.0)
MCV: 73.7 fL — ABNORMAL LOW (ref 78.0–100.0)
Platelets: 122 10*3/uL — ABNORMAL LOW (ref 150–400)
RBC: 4.29 MIL/uL (ref 4.22–5.81)
WBC: 5.5 10*3/uL (ref 4.0–10.5)

## 2012-02-18 LAB — GLUCOSE, CAPILLARY
Glucose-Capillary: 122 mg/dL — ABNORMAL HIGH (ref 70–99)
Glucose-Capillary: 123 mg/dL — ABNORMAL HIGH (ref 70–99)

## 2012-02-18 LAB — MAGNESIUM: Magnesium: 1.7 mg/dL (ref 1.5–2.5)

## 2012-02-18 MED ORDER — WARFARIN - PHARMACIST DOSING INPATIENT: Freq: Every day | Status: DC

## 2012-02-18 MED ORDER — WARFARIN SODIUM 5 MG PO TABS
5.0000 mg | ORAL_TABLET | ORAL | Status: AC
  Administered 2012-02-18: 5 mg via ORAL
  Filled 2012-02-18: qty 1

## 2012-02-18 NOTE — Progress Notes (Signed)
General surgery attending note:  Patient interviewed and examined. I agree with the assessment and treatment plan outlined by [name].SB seems to be resolving.  Angelia Mould. Derrell Lolling, M.D., [facility] General and Minimally invasive Surgery Breast and Colorectal Surgery Office:   [phone] Pager:   [phone]

## 2012-02-18 NOTE — Progress Notes (Signed)
ANTICOAGULATION CONSULT NOTE - Initial Consult  Pharmacy Consult coumadin Indication: atrial fibrillation  No Known Allergies  Patient Measurements: Height: 5\' 11"  (180.3 cm) Weight: 162 lb 0.6 oz (73.5 kg) IBW/kg (Calculated) : 75.3  Heparin Dosing Weight: 73.5  Vital Signs: Temp: 97.7 F (36.5 C) (10/01 1018) Temp src: Oral (10/01 1018) BP: 136/80 mmHg (10/01 1018) Pulse Rate: 66  (10/01 1018)  Labs:  Basename 02/18/12 0530 02/17/12 2207 02/17/12 0520 02/16/12 0910  HGB 10.6* -- -- --  HCT 31.6* -- -- --  PLT 122* -- -- --  APTT -- -- -- --  LABPROT -- -- -- --  INR -- -- -- --  HEPARINUNFRC -- 1.34* -- --  CREATININE 1.01 -- 1.06 1.04  CKTOTAL -- -- -- --  CKMB -- -- -- --  TROPONINI -- -- -- --    Estimated Creatinine Clearance: 55.6 ml/min (by C-G formula based on Cr of 1.01).   Medical History: Past Medical History  Diagnosis Date  . Tachycardia-bradycardia syndrome   . Atrial fibrillation   . Hypertension   . Diabetes mellitus   . Hx SBO   . Ventral hernia   . Prostate cancer     Medications:  Prescriptions prior to admission  Medication Sig Dispense Refill  . diltiazem (CARDIZEM LA) 240 MG 24 hr tablet Take 240 mg by mouth daily.        . metoprolol tartrate (LOPRESSOR) 25 MG tablet Take 25 mg by mouth 2 (two) times daily.        Marland Kitchen olmesartan-hydrochlorothiazide (BENICAR HCT) 40-12.5 MG per tablet Take 1 tablet by mouth daily.        . Tamsulosin HCl (FLOMAX) 0.4 MG CAPS take 1 capsule by mouth at bedtime  30 capsule  5  . warfarin (COUMADIN) 5 MG tablet Take 2.5 mg by mouth daily.        Assessment: 78 yo man with recurrent pSBO with multiple previous abdominal surgeries.  He was on coumadin PTA for PAF. His home dose is 2.5 mg po daily.  Coumadin was on hold 2nd NPO for SBO.  His INR on admission was 1.23. He was started on heparin bridge therapy yesterday and now MD has ordered to stop heparin and resume coumadin for DC later today.    Goal  of Therapy: INR 2-3 Plan:  1. Coumadin 5 mg po x1  Dose ASAP 2. Home dose was 2.5 mg daily with low INR on admission Herby Abraham, Pharm.D. [phone] 02/18/2012 12:05 PM

## 2012-02-18 NOTE — Progress Notes (Signed)
Pt discharged to home with by granddaughter discharge instructions explained pt verbalized understanding. IV's d/c'd

## 2012-02-18 NOTE — Discharge Summary (Signed)
[facility]                                                                                   [name], is a 78 y.o. male  DOB January 03, 1924  MRN [id number].  Admission date:  02/14/2012  Discharge Date:  02/18/2012  Primary MD  [name], MD  Admitting Physician  Ron Parker, MD  Admission Diagnosis  SBO (small bowel obstruction) [560.9] stomach pain  Discharge Diagnosis     Principal Problem:  *SBO (small bowel obstruction) Active Problems:  DIABETES MELLITUS  Atrial fibrillation  BRADYCARDIA-TACHYCARDIA SYNDROME  Cardiac pacemaker in situ  Abdominal pain  Nausea & vomiting   Past Medical History  Diagnosis Date  . Tachycardia-bradycardia syndrome   . Atrial fibrillation   . Hypertension   . Diabetes mellitus   . Hx SBO   . Ventral hernia   . Prostate cancer     Past Surgical History  Procedure Date  . Exploratory laparotomy   . Small intestine surgery 09/16/2004    SBO resection, VH repair  . Hemorrhoid surgery   . Hiatal hernia repair 2002  . Pacemaker insertion   . Cholecystectomy open 03/31/2001    Dr Wiliam Ke     Recommendations for primary care physician for things to follow:   Please monitor INR closely   Discharge Diagnoses:   Principal Problem:  *SBO (small bowel obstruction) Active Problems:  DIABETES MELLITUS  Atrial fibrillation  BRADYCARDIA-TACHYCARDIA SYNDROME  Cardiac pacemaker in situ  Abdominal pain  Nausea & vomiting    Discharge Condition: Stable   Diet recommendation: See Discharge Instructions below   Consults Gen. surgery   History of present illness and  Hospital Course:  See H&P, Labs, Consult and Test reports for all details in brief, patient was admitted for  reoccurrence of small bowel obstruction due to previous adhesions from previous abdominal surgeries, patient was treated with bowel rest, NG for decompression, thereafter he showed clinical improvement, NG was removed  yesterday, he is tolerated clear diet followed by soft diet today, is passing flatus and had 2 bowel movements one yesterday night one this morning, currently patient is completely symptom free and eager to go home. He was seen and cleared by general surgery.     Has underlying history of tachybradycardia syndrome status post pacemaker placement along with atrial fibrillation, he will continue his Cardizem and Lopressor at home dose, his Coumadin would was held for a few days due to his n.p.o. status, have given him today's dose of Coumadin dosed by pharmacy, INR is 1.23, will request primary care physician to close monitor patient's Coumadin and INR closely.   History of hypertension stable on home medications no changes made.    Today   Subjective:   [name] today has no headache,no chest abdominal pain,no new weakness tingling or numbness, feels much better wants to go home today. Has had 2 bowel movements.  Objective:   Blood pressure 137/92, pulse 66, temperature 97.9 F (36.6 C), temperature source Oral, resp. rate 20, height 5\' 11"  (1.803 m), weight 73.5 kg (162 lb 0.6 oz), SpO2 100.00%.   Intake/Output Summary (Last 24  hours) at 02/18/12 1744 Last data filed at 02/18/12 1300  Gross per 24 hour  Intake   1200 ml  Output   1225 ml  Net    -25 ml    Exam Awake Alert, Oriented *3, No new F.N deficits, Normal affect [place].AT,PERRAL Supple Neck,No JVD, No cervical lymphadenopathy appriciated.  Symmetrical Chest wall movement, Good air movement bilaterally, CTAB RRR,No Gallops,Rubs or new Murmurs, No Parasternal Heave +ve B.Sounds, Abd Soft, Non tender, No organomegaly appriciated, No rebound -guarding or rigidity. No Cyanosis, Clubbing or edema, No new Rash or bruise  Data Review   Major procedures and Radiology Reports - PLEASE review detailed and final reports for all details in brief -       Dg Abd 1 View  02/18/2012  *RADIOLOGY REPORT*  Clinical Data: Small  bowel obstruction  ABDOMEN - 1 VIEW  Comparison: 02/17/2012, 02/16/2012  Findings: Gaseous distention of small bowel persists.  Distal colon and rectum appear decompressed.  Minimal colonic gas noted. Appearance compatible with persistent partial small bowel obstruction pattern.  Degenerative changes of the spine.  IMPRESSION: Persistent partial small bowel obstruction pattern.   Original Report Authenticated By: Judie Petit. Ruel Favors, M.D.    Dg Abd Acute W/chest  02/17/2012  *RADIOLOGY REPORT*  Clinical Data: Small bowel obstruction.  ACUTE ABDOMEN SERIES (ABDOMEN 2 VIEW & CHEST 1 VIEW)  Comparison: 02/16/2012 and 02/14/2012.  Findings: Frontal view of the chest shows midline trachea normal heart size.  Pacemaker lead tips are stable in position.  Hiatal hernia again noted.  Tiny bilateral pleural effusions.  Lungs are otherwise clear.  Nipple shadows project over the lower hemithoraces.  Persistent dilated loops of small bowel with associated air fluid levels.  Some colonic gas and stool are seen in the left abdomen.  IMPRESSION:  1.  Persistent small bowel obstruction. 2.  Tiny bilateral pleural effusions.   Original Report Authenticated By: Reyes Ivan, M.D.    Dg Abd Acute W/chest  02/14/2012  *RADIOLOGY REPORT*  Clinical Data: Pain.  ACUTE ABDOMEN SERIES (ABDOMEN 2 VIEW & CHEST 1 VIEW)  Comparison: 06/11/2010.  Findings: Trachea is midline.  Heart size stable.  An air-filled hiatal hernia is moderate in size.  Pacemaker lead tips project over the right atrium and right ventricle.  Lungs are clear.  No pleural fluid.  Two views of the abdomen show multiple air-fluid levels and dilated loops of small bowel.  Dilated small bowel measures up to 6.1 cm. There is likely minimal colonic gas in the right abdomen.  Surgical changes are seen in the anatomic pelvis.  IMPRESSION: High-grade small bowel obstruction.   Original Report Authenticated By: Reyes Ivan, M.D.    Dg Abd Portable 1v  02/16/2012   *RADIOLOGY REPORT*  Clinical Data: SBO  PORTABLE ABDOMEN - 1 VIEW  Comparison: 02/07/2012  Findings: Mildly prominent loops of small bowel in the left mid abdomen, although improved.  Colon is no longer decompressed.  Degenerative changes of the visualized thoracolumbar spine.  IMPRESSION: Mildly prominent loops of small bowel in the left mid abdomen, improved, likely reflecting resolving small bowel obstruction.   Original Report Authenticated By: Charline Bills, M.D.    Dg Abd Portable 1v  02/15/2012  *RADIOLOGY REPORT*  Clinical Data: Small bowel obstruction.  PORTABLE ABDOMEN - 1 VIEW  Comparison: Abdominal radiograph 02/14/2012.  Findings: There is a paucity of distal colonic and rectal gas. Numerous dilated loops of small bowel are again noted projecting over the central abdomen and  left upper quadrant, measuring up to 5.3 cm in diameter.  No definite pneumoperitoneum identified on this single supine view of the abdomen.  IMPRESSION: 1.  Findings remain consistent with small bowel obstruction, as above.   Original Report Authenticated By: Florencia Reasons, M.D.    Dg Abd Portable 1v  02/14/2012  *RADIOLOGY REPORT*  Clinical Data: Abdominal pain  PORTABLE ABDOMEN - 1 VIEW  Comparison: 02/14/2012 at 1914 hours  Findings: Again seen are dilated loops of small bowel in the left mid abdomen, worrisome for small bowel obstruction.  IMPRESSION: Dilated loops of small bowel in the left mid abdomen, worrisome for small bowel obstruction, unchanged.   Original Report Authenticated By: Charline Bills, M.D.     Micro Results      No results found for this or any previous visit (from the past 240 hour(s)).   CBC w Diff: Lab Results  Component Value Date   WBC 5.5 02/18/2012   HGB 10.6* 02/18/2012   HCT 31.6* 02/18/2012   PLT 122* 02/18/2012   LYMPHOPCT 23 02/14/2012   MONOPCT 9 02/14/2012   EOSPCT 1 02/14/2012   BASOPCT 0 02/14/2012    CMP: Lab Results  Component Value Date   NA 138 02/18/2012    K 4.1 02/18/2012   CL 106 02/18/2012   CO2 25 02/18/2012   BUN 5* 02/18/2012   CREATININE 1.01 02/18/2012   PROT 8.5* 02/14/2012   ALBUMIN 4.6 02/14/2012   BILITOT 0.3 02/14/2012   ALKPHOS 98 02/14/2012   AST 26 02/14/2012   ALT 10 02/14/2012  . Lab Results  Component Value Date   INR 1.23 02/14/2012   INR 2.30* 06/13/2010   INR 2.18* 06/12/2010      Discharge Instructions     Follow with Primary MD [name], MD in 3 days   Get CBC, BMP, INR checked 3 days by Primary MD and again as instructed by your Primary MD.    Get Medicines reviewed and adjusted.  Please request your Prim.MD to go over all Hospital Tests and Procedure/Radiological results at the follow up, please get all Hospital records sent to your Prim MD by signing hospital release before you go home.  Activity: As tolerated with Full fall precautions use walker/cane & assistance as needed   Diet:  Heart Healthy low residue soft diet, advance to regular heart healthy diet as tolerated over the next  2 days gradually.  For Heart failure patients - Check your Weight same time everyday, if you gain over 2 pounds, or you develop in leg swelling, experience more shortness of breath or chest pain, call your Primary MD immediately. Follow Cardiac Low Salt Diet and 1.8 lit/day fluid restriction.  Disposition Home   If you experience worsening of your admission symptoms, develop shortness of breath, life threatening emergency, suicidal or homicidal thoughts you must seek medical attention immediately by calling 911 or calling your MD immediately  if symptoms less severe.  You Must read complete instructions/literature along with all the possible adverse reactions/side effects for all the Medicines you take and that have been prescribed to you. Take any new Medicines after you have completely understood and accpet all the possible adverse reactions/side effects.   Do not drive and provide baby sitting services if your were  admitted for syncope or siezures until you have seen by Primary MD or a Neurologist and advised to do so again.  Do not drive when taking Pain medications.    Do not take more  than prescribed Pain, Sleep and Anxiety Medications  Special Instructions: If you have smoked or chewed Tobacco  in the last 2 yrs please stop smoking, stop any regular Alcohol  and or any Recreational drug use.  Wear Seat belts while driving.      Follow-up Information    Follow up with [name], MD. Schedule an appointment as soon as possible for a visit in 3 days.   Contact information:   [id number] [place] [address] [phone]            Discharge Medications     Medication List     As of 02/18/2012  5:44 PM    CONTINUE taking these medications         diltiazem 240 MG 24 hr tablet   Commonly known as: CARDIZEM LA      metoprolol tartrate 25 MG tablet   Commonly known as: LOPRESSOR      olmesartan-hydrochlorothiazide 40-12.5 MG per tablet   Commonly known as: BENICAR HCT      Tamsulosin HCl 0.4 MG Caps   Commonly known as: FLOMAX   take 1 capsule by mouth at bedtime      warfarin 5 MG tablet   Commonly known as: COUMADIN            Total Time in preparing paper work, data evaluation and todays exam - 35 minutes  Leroy Sea M.D on 02/18/2012 at 5:44 PM  [facility] Office  [phone]

## 2012-02-18 NOTE — Progress Notes (Signed)
Patient ID: [name], male   DOB: 1924/04/20, 78 y.o.   MRN: [id number]    Subjective: Pt feels well today.  Had 3 BMs and still passing flatus.  No nausea with clears.  Objective: Vital signs in last 24 hours: Temp:  [97.3 F (36.3 C)-98.2 F (36.8 C)] 97.9 F (36.6 C) (10/01 0539) Pulse Rate:  [65-71] 65  (10/01 0539) Resp:  [18-20] 20  (10/01 0539) BP: (112-146)/(65-98) 129/74 mmHg (10/01 0539) SpO2:  [98 %-100 %] 100 % (10/01 0539) Last BM Date: 02/14/12  Intake/Output from previous day: 09/30 0701 - 10/01 0700 In: 1980 [P.O.:1560; I.V.:420] Out: 1500 [Urine:1500] Intake/Output this shift:    PE: Abd: soft, NT, ND, reducible incisional hernia HT: regular Lungs: CTAB  Lab Results:   Basename 02/18/12 0530  WBC 5.5  HGB 10.6*  HCT 31.6*  PLT 122*   BMET  Basename 02/18/12 0530 02/17/12 0520  NA 138 140  K 4.1 3.4*  CL 106 105  CO2 25 27  GLUCOSE 123* 150*  BUN 5* 6  CREATININE 1.01 1.06  CALCIUM 8.8 8.7   PT/INR No results found for this basename: LABPROT:2,INR:2 in the last 72 hours CMP     Component Value Date/Time   NA 138 02/18/2012 0530   K 4.1 02/18/2012 0530   CL 106 02/18/2012 0530   CO2 25 02/18/2012 0530   GLUCOSE 123* 02/18/2012 0530   BUN 5* 02/18/2012 0530   CREATININE 1.01 02/18/2012 0530   CALCIUM 8.8 02/18/2012 0530   PROT 8.5* 02/14/2012 2103   ALBUMIN 4.6 02/14/2012 2103   AST 26 02/14/2012 2103   ALT 10 02/14/2012 2103   ALKPHOS 98 02/14/2012 2103   BILITOT 0.3 02/14/2012 2103   GFRNONAA 66* 02/18/2012 0530   GFRAA 76* 02/18/2012 0530   Lipase     Component Value Date/Time   LIPASE 22 02/08/2009 2105       Studies/Results: Dg Abd Acute W/chest  02/17/2012  *RADIOLOGY REPORT*  Clinical Data: Small bowel obstruction.  ACUTE ABDOMEN SERIES (ABDOMEN 2 VIEW & CHEST 1 VIEW)  Comparison: 02/16/2012 and 02/14/2012.  Findings: Frontal view of the chest shows midline trachea normal heart size.  Pacemaker lead tips are stable in  position.  Hiatal hernia again noted.  Tiny bilateral pleural effusions.  Lungs are otherwise clear.  Nipple shadows project over the lower hemithoraces.  Persistent dilated loops of small bowel with associated air fluid levels.  Some colonic gas and stool are seen in the left abdomen.  IMPRESSION:  1.  Persistent small bowel obstruction. 2.  Tiny bilateral pleural effusions.   Original Report Authenticated By: Reyes Ivan, M.D.    Dg Abd Portable 1v  02/16/2012  *RADIOLOGY REPORT*  Clinical Data: SBO  PORTABLE ABDOMEN - 1 VIEW  Comparison: 02/07/2012  Findings: Mildly prominent loops of small bowel in the left mid abdomen, although improved.  Colon is no longer decompressed.  Degenerative changes of the visualized thoracolumbar spine.  IMPRESSION: Mildly prominent loops of small bowel in the left mid abdomen, improved, likely reflecting resolving small bowel obstruction.   Original Report Authenticated By: Charline Bills, M.D.     Anti-infectives: Anti-infectives    None       Assessment/Plan  1. PSBO, resolving  Plan: 1. Will advance to full liquids and then as tolerates.   LOS: 4 days    Ellanora Rayborn E 02/18/2012, 8:10 AM Pager: [phone]

## 2012-02-20 DIAGNOSIS — I4891 Unspecified atrial fibrillation: Secondary | ICD-10-CM

## 2012-02-20 DIAGNOSIS — Z950 Presence of cardiac pacemaker: Secondary | ICD-10-CM

## 2012-02-21 LAB — REMOTE PACEMAKER DEVICE
AL IMPEDENCE PM: 472 Ohm
BATTERY VOLTAGE: 2.81 V
RV LEAD IMPEDENCE PM: 416 Ohm

## 2012-03-03 ENCOUNTER — Encounter: Payer: Self-pay | Admitting: *Deleted

## 2012-03-03 ENCOUNTER — Encounter: Payer: Self-pay | Admitting: Internal Medicine

## 2012-03-03 ENCOUNTER — Ambulatory Visit (INDEPENDENT_AMBULATORY_CARE_PROVIDER_SITE_OTHER): Payer: Medicare Other | Admitting: Internal Medicine

## 2012-03-03 VITALS — BP 118/68 | HR 107 | Ht 71.0 in | Wt 166.0 lb

## 2012-03-03 DIAGNOSIS — Z950 Presence of cardiac pacemaker: Secondary | ICD-10-CM

## 2012-03-03 DIAGNOSIS — I4891 Unspecified atrial fibrillation: Secondary | ICD-10-CM

## 2012-03-03 NOTE — Assessment & Plan Note (Signed)
His Medtronic dual-chamber pacemaker is working normally but has reached elective replacement. We will schedule pacemaker removal and insertion of a new device in the next few weeks.

## 2012-03-03 NOTE — Patient Instructions (Addendum)
Pacer generator change---see instruction sheet

## 2012-03-03 NOTE — Assessment & Plan Note (Signed)
His ventricular rate appears to be well-controlled. He will continue his current medical therapy. 

## 2012-03-03 NOTE — Progress Notes (Signed)
HPI [name] returns today for followup. He is a very pleasant 78 year old man with symptomatic bradycardia, tachycardia bradycardia syndrome, status post permanent insertion, and chronic atrial fibrillation. In the interim, he has done well. He denies chest pain or shortness of breath. He has reached elective replacement on his pacemaker, approximately one month ago. He denies syncope. No Known Allergies   Current Outpatient Prescriptions  Medication Sig Dispense Refill  . diltiazem (CARDIZEM LA) 240 MG 24 hr tablet Take 240 mg by mouth daily.        Marland Kitchen latanoprost (XALATAN) 0.005 % ophthalmic solution Place 1 drop into the left eye at bedtime.       . metoprolol tartrate (LOPRESSOR) 25 MG tablet Take 25 mg by mouth 2 (two) times daily.        Marland Kitchen olmesartan-hydrochlorothiazide (BENICAR HCT) 40-12.5 MG per tablet Take 1 tablet by mouth daily.        . Tamsulosin HCl (FLOMAX) 0.4 MG CAPS take 1 capsule by mouth at bedtime  30 capsule  5  . warfarin (COUMADIN) 5 MG tablet Take 2.5 mg by mouth daily.         Past Medical History  Diagnosis Date  . Tachycardia-bradycardia syndrome   . Atrial fibrillation   . Hypertension   . Diabetes mellitus   . Hx SBO   . Ventral hernia   . Prostate cancer     ROS:   All systems reviewed and negative except as noted in the HPI.   Past Surgical History  Procedure Date  . Exploratory laparotomy   . Small intestine surgery 09/16/2004    SBO resection, VH repair  . Hemorrhoid surgery   . Hiatal hernia repair 2002  . Pacemaker insertion   . Cholecystectomy open 03/31/2001    Dr Wiliam Ke     Family History  Problem Relation Age of Onset  . Pneumonia Father   . Cancer Brother     type unknown     History   Social History  . Marital Status: Widowed    Spouse Name: N/A    Number of Children: N/A  . Years of Education: N/A   Occupational History  . Not on file.   Social History Main Topics  . Smoking status: Former Smoker -- 1.5  packs/day    Quit date: 04/16/1955  . Smokeless tobacco: Not on file  . Alcohol Use: No  . Drug Use: No  . Sexually Active: Not on file   Other Topics Concern  . Not on file   Social History Narrative  . No narrative on file     BP 118/68  Pulse 107  Ht 5\' 11"  (1.803 m)  Wt 166 lb (75.297 kg)  BMI 23.15 kg/m2  SpO2 98%  Physical Exam:  Well appearing elderly man, NAD HEENT: Unremarkable Neck:  No JVD, no thyromegally Lungs:  Clear with no wheezes, rales, or rhonchi. HEART:  Regular rate rhythm, no murmurs, no rubs, no clicks Abd:  soft, positive bowel sounds, no organomegally, no rebound, no guarding Ext:  2 plus pulses, no edema, no cyanosis, no clubbing Skin:  No rashes no nodules Neuro:  CN II through XII intact, motor grossly intact   DEVICE  Normal device function.  See PaceArt for details. Device at elective replacement.  Assess/Plan:

## 2012-03-06 ENCOUNTER — Other Ambulatory Visit: Payer: Self-pay | Admitting: *Deleted

## 2012-03-06 DIAGNOSIS — I4891 Unspecified atrial fibrillation: Secondary | ICD-10-CM

## 2012-03-16 ENCOUNTER — Encounter: Payer: Self-pay | Admitting: Internal Medicine

## 2012-03-24 ENCOUNTER — Encounter (HOSPITAL_COMMUNITY): Payer: Self-pay | Admitting: Pharmacy Technician

## 2012-03-30 ENCOUNTER — Other Ambulatory Visit (INDEPENDENT_AMBULATORY_CARE_PROVIDER_SITE_OTHER): Payer: Medicare Other

## 2012-03-30 DIAGNOSIS — I4891 Unspecified atrial fibrillation: Secondary | ICD-10-CM

## 2012-03-30 LAB — CBC WITH DIFFERENTIAL/PLATELET
Basophils Relative: 0.8 % (ref 0.0–3.0)
Eosinophils Relative: 2.1 % (ref 0.0–5.0)
HCT: 34.3 % — ABNORMAL LOW (ref 39.0–52.0)
Lymphs Abs: 1.1 10*3/uL (ref 0.7–4.0)
MCV: 78.5 fl (ref 78.0–100.0)
Monocytes Absolute: 0.4 10*3/uL (ref 0.1–1.0)
Monocytes Relative: 8.8 % (ref 3.0–12.0)
Neutrophils Relative %: 63.4 % (ref 43.0–77.0)
RBC: 4.36 Mil/uL (ref 4.22–5.81)
WBC: 4.6 10*3/uL (ref 4.5–10.5)

## 2012-03-30 LAB — BASIC METABOLIC PANEL
Chloride: 107 mEq/L (ref 96–112)
GFR: 75.26 mL/min (ref >60.00)
Potassium: 4.4 mEq/L (ref 3.5–5.1)

## 2012-03-30 LAB — PROTIME-INR: INR: 1.3 ratio — ABNORMAL HIGH (ref 0.8–1.0)

## 2012-04-05 MED ORDER — CEFAZOLIN SODIUM-DEXTROSE 2-3 GM-% IV SOLR
2.0000 g | INTRAVENOUS | Status: DC
  Filled 2012-04-05: qty 50

## 2012-04-05 MED ORDER — SODIUM CHLORIDE 0.9 % IR SOLN
80.0000 mg | Status: DC
  Filled 2012-04-05: qty 2

## 2012-04-06 ENCOUNTER — Encounter (HOSPITAL_COMMUNITY)
Admission: RE | Disposition: A | Discharge status: Home / Self Care | Payer: Self-pay | Source: Ambulatory Visit | Attending: Internal Medicine

## 2012-04-06 ENCOUNTER — Ambulatory Visit (HOSPITAL_COMMUNITY)
Admission: RE | Admit: 2012-04-06 | Discharge: 2012-04-06 | Disposition: A | Discharge status: Home / Self Care | Payer: Medicare Other | Source: Ambulatory Visit | Attending: Internal Medicine | Admitting: Internal Medicine

## 2012-04-06 DIAGNOSIS — I4891 Unspecified atrial fibrillation: Secondary | ICD-10-CM

## 2012-04-06 DIAGNOSIS — Z8546 Personal history of malignant neoplasm of prostate: Secondary | ICD-10-CM | POA: Insufficient documentation

## 2012-04-06 DIAGNOSIS — I10 Essential (primary) hypertension: Secondary | ICD-10-CM | POA: Insufficient documentation

## 2012-04-06 DIAGNOSIS — Z45018 Encounter for adjustment and management of other part of cardiac pacemaker: Secondary | ICD-10-CM | POA: Insufficient documentation

## 2012-04-06 DIAGNOSIS — E119 Type 2 diabetes mellitus without complications: Secondary | ICD-10-CM | POA: Insufficient documentation

## 2012-04-06 DIAGNOSIS — Z79899 Other long term (current) drug therapy: Secondary | ICD-10-CM | POA: Insufficient documentation

## 2012-04-06 HISTORY — PX: PERMANENT PACEMAKER GENERATOR CHANGE: SHX6022

## 2012-04-06 LAB — PROTIME-INR: INR: 1.08 (ref 0.00–1.49)

## 2012-04-06 LAB — GLUCOSE, CAPILLARY
Glucose-Capillary: 88 mg/dL (ref 70–99)
Glucose-Capillary: 83 mg/dL (ref 70–99)

## 2012-04-06 LAB — SURGICAL PCR SCREEN: Staphylococcus aureus: NEGATIVE

## 2012-04-06 SURGERY — PERMANENT PACEMAKER GENERATOR CHANGE
Anesthesia: LOCAL

## 2012-04-06 MED ORDER — ACETAMINOPHEN 325 MG PO TABS
325.0000 mg | ORAL_TABLET | ORAL | Status: DC | PRN
  Filled 2012-04-06: qty 2

## 2012-04-06 MED ORDER — SODIUM CHLORIDE 0.9 % IJ SOLN: 3.0000 mL | INTRAMUSCULAR | Status: DC | PRN

## 2012-04-06 MED ORDER — MUPIROCIN 2 % EX OINT
TOPICAL_OINTMENT | Freq: Two times a day (BID) | CUTANEOUS | Status: DC
  Administered 2012-04-06: 1 via NASAL

## 2012-04-06 MED ORDER — MUPIROCIN 2 % EX OINT
TOPICAL_OINTMENT | CUTANEOUS | Status: AC
  Filled 2012-04-06: qty 22

## 2012-04-06 MED ORDER — FENTANYL CITRATE 0.05 MG/ML IJ SOLN
INTRAMUSCULAR | Status: AC
  Filled 2012-04-06: qty 2

## 2012-04-06 MED ORDER — CHLORHEXIDINE GLUCONATE 4 % EX LIQD: 60.0000 mL | Freq: Once | CUTANEOUS | Status: DC

## 2012-04-06 MED ORDER — SODIUM CHLORIDE 0.9 % IJ SOLN: 3.0000 mL | Freq: Two times a day (BID) | INTRAMUSCULAR | Status: DC

## 2012-04-06 MED ORDER — SODIUM CHLORIDE 0.9 % IV SOLN: 250.0000 mL | INTRAVENOUS | Status: DC

## 2012-04-06 MED ORDER — MIDAZOLAM HCL 5 MG/5ML IJ SOLN
INTRAMUSCULAR | Status: AC
  Filled 2012-04-06: qty 5

## 2012-04-06 MED ORDER — SODIUM CHLORIDE 0.45 % IV SOLN
INTRAVENOUS | Status: DC
  Administered 2012-04-06: 08:00:00 via INTRAVENOUS

## 2012-04-06 MED ORDER — ONDANSETRON HCL 4 MG/2ML IJ SOLN: 4.0000 mg | Freq: Four times a day (QID) | INTRAMUSCULAR | Status: DC | PRN

## 2012-04-06 MED ORDER — LIDOCAINE HCL (PF) 1 % IJ SOLN
INTRAMUSCULAR | Status: AC
  Filled 2012-04-06: qty 60

## 2012-04-06 NOTE — Op Note (Signed)
EP Procedure note  Procedure - PPM removal and insertion of a new device.  Indication - symptomatic bradycardia, s/p PPM with device at [facility]  N#[id number].

## 2012-04-06 NOTE — H&P (Signed)
HPI  [name] returns today for followup. He is a very pleasant 78 year old man with symptomatic bradycardia, tachycardia bradycardia syndrome, status post permanent insertion, and chronic atrial fibrillation. In the interim, he has done well. He denies chest pain or shortness of breath. He has reached elective replacement on his pacemaker, approximately one month ago. He denies syncope.  No Known Allergies  Current Outpatient Prescriptions   Medication  Sig  Dispense  Refill   .  diltiazem (CARDIZEM LA) 240 MG 24 hr tablet  Take 240 mg by mouth daily.     Marland Kitchen  latanoprost (XALATAN) 0.005 % ophthalmic solution  Place 1 drop into the left eye at bedtime.     .  metoprolol tartrate (LOPRESSOR) 25 MG tablet  Take 25 mg by mouth 2 (two) times daily.     Marland Kitchen  olmesartan-hydrochlorothiazide (BENICAR HCT) 40-12.5 MG per tablet  Take 1 tablet by mouth daily.     .  Tamsulosin HCl (FLOMAX) 0.4 MG CAPS  take 1 capsule by mouth at bedtime  30 capsule  5   .  warfarin (COUMADIN) 5 MG tablet  Take 2.5 mg by mouth daily.      Past Medical History   Diagnosis  Date   .  Tachycardia-bradycardia syndrome    .  Atrial fibrillation    .  Hypertension    .  Diabetes mellitus    .  Hx SBO    .  Ventral hernia    .  Prostate cancer     ROS:  All systems reviewed and negative except as noted in the HPI.  Past Surgical History   Procedure  Date   .  Exploratory laparotomy    .  Small intestine surgery  09/16/2004     SBO resection, VH repair   .  Hemorrhoid surgery    .  Hiatal hernia repair  2002   .  Pacemaker insertion    .  Cholecystectomy open  03/31/2001     Dr Wiliam Ke    Family History   Problem  Relation  Age of Onset   .  Pneumonia  Father    .  Cancer  Brother       type unknown    History    Social History   .  Marital Status:  Widowed     Spouse Name:  N/A     Number of Children:  N/A   .  Years of Education:  N/A    Occupational History   .  Not on file.    Social History Main  Topics   .  Smoking status:  Former Smoker -- 1.5 packs/day     Quit date:  04/16/1955   .  Smokeless tobacco:  Not on file   .  Alcohol Use:  No   .  Drug Use:  No   .  Sexually Active:  Not on file    Other Topics  Concern   .  Not on file    Social History Narrative   .  No narrative on file    BP 118/68  Pulse 107  Ht 5\' 11"  (1.803 m)  Wt 166 lb (75.297 kg)  BMI 23.15 kg/m2  SpO2 98%  Physical Exam:  Well appearing elderly man, NAD  HEENT: Unremarkable  Neck: No JVD, no thyromegally  Lungs: Clear with no wheezes, rales, or rhonchi.  HEART: Regular rate rhythm, no murmurs, no rubs, no clicks  Abd: soft, positive  bowel sounds, no organomegally, no rebound, no guarding  Ext: 2 plus pulses, no edema, no cyanosis, no clubbing  Skin: No rashes no nodules  Neuro: CN II through XII intact, motor grossly intact  DEVICE  Normal device function. See PaceArt for details. Device at elective replacement.  Assess/Plan:  Cardiac pacemaker in situ - Matthew Bunting, MD 03/03/2012 6:49 PM Signed  His Medtronic dual-chamber pacemaker is working normally but has reached elective replacement. We will schedule pacemaker removal and insertion of a new device in the next few weeks. Atrial fibrillation - Matthew Bunting, MD 03/03/2012 6:50 PM Signed  His ventricular rate appears to be well-controlled. He will continue his current medical therapy.  EP Attending  Patient seen and examined. Since my last clinic vist 33 days ago, there has been no change in the history, physical exam, assessment and plan. Will proceed with PPM generator change. The risk/benefits/goals/expectations of the procedure have been discussed with the patient and he wishes to proceed.  [name], M.D.

## 2012-04-06 NOTE — H&P (Signed)
ELECTROPHYSIOLOGY ADMISSION HISTORY & PHYSICAL  Patient ID: [name] MRN: [id number], DOB/AGE: 1923-07-14   Date of Admission: 04/06/2012  Primary Physician: [name], MD Primary Cardiologist: Ladona Ridgel, MD Reason for Admission: Pacemaker battery at [facility]  History of Present Illness [name] is a pleasant 78 year old gentleman with symptomatic bradycardia s/p PPM and permanent AFib who was evaluated by Dr. Ladona Ridgel 03/03/2012 at which time his PPM battery was found to be at [facility]. He presents today for PPM generator change. He is accompanied by his daughters who are at bedside. He denies any complaints. He denies CP, SOB, palpitations, dizziness or syncope. He denies any recent illness, fever or chills. He denies any changes to his medications.   Past Medical History  Diagnosis Date  . Tachycardia-bradycardia syndrome   . Atrial fibrillation   . Hypertension   . Diabetes mellitus   . Hx SBO   . Ventral hernia   . Prostate cancer     Past Surgical History  Procedure Date  . Exploratory laparotomy   . Small intestine surgery 09/16/2004    SBO resection, VH repair  . Hemorrhoid surgery   . Hiatal hernia repair 2002  . Pacemaker insertion   . Cholecystectomy open 03/31/2001    Dr Wiliam Ke     Allergies/Intolerances No Known Allergies  Home Medications Medications Prior to Admission  Medication Sig Dispense Refill  . diltiazem (CARDIZEM LA) 240 MG 24 hr tablet Take 240 mg by mouth daily.        Marland Kitchen latanoprost (XALATAN) 0.005 % ophthalmic solution Place 1 drop into the left eye at bedtime.       Marland Kitchen losartan-hydrochlorothiazide (HYZAAR) 100-12.5 MG per tablet Take 1 tablet by mouth daily.      . metoprolol tartrate (LOPRESSOR) 25 MG tablet Take 25 mg by mouth 2 (two) times daily.        Marland Kitchen warfarin (COUMADIN) 5 MG tablet Take 2.5 mg by mouth daily.        Family History Family History  Problem Relation Age of Onset  . Pneumonia Father   . Cancer Brother     type  unknown     Social History Social History  . Marital Status: Widowed   Social History Main Topics  . Smoking status: Former Smoker -- 1.5 packs/day    Quit date: 04/16/1955  . Smokeless tobacco: Not on file  . Alcohol Use: No  . Drug Use: No   Review of Systems General: No chills, fever, night sweats or weight changes.  Cardiovascular: No chest pain, dyspnea on exertion, edema, orthopnea, palpitations, paroxysmal nocturnal dyspnea. Dermatological: No rash, lesions or masses. Respiratory: No cough, dyspnea. Urologic: No hematuria, dysuria. Abdominal: No nausea, vomiting, diarrhea, bright red blood per rectum, melena, or hematemesis. Neurologic: No visual changes, weakness, changes in mental status. All other systems reviewed and are otherwise negative except as noted above.  Physical Exam Blood pressure 145/83, pulse 63, temperature 97.3 F (36.3 C), temperature source Oral, resp. rate 18, height 5\' 11"  (1.803 m), weight 165 lb (74.844 kg), SpO2 92.00%.  General: Well developed, well appearing 78 year old male in no acute distress. HEENT: Normocephalic, atraumatic. EOMs intact. Sclera nonicteric. Oropharynx clear.  Neck: Supple. No JVD. Lungs: Respirations regular and unlabored, CTA bilaterally. No wheezes, rales or rhonchi. Heart: RRR. S1, S2 present. No murmurs, rub, S3 or S4. Abdomen: Soft, non-distended.  Extremities: No clubbing, cyanosis or edema. DP/PT/Radials 2+ and equal bilaterally. Psych: Normal affect. Neuro: Alert and  oriented X 3. Moves all extremities spontaneously. Musculoskeletal: No kyphosis. Skin: Intact. Warm and dry. No rashes or petechiae in exposed areas.   Labs Lab Results  Component Value Date   WBC 4.6 03/30/2012   HGB 10.9* 03/30/2012   HCT 34.3* 03/30/2012   MCV 78.5 03/30/2012   PLT 147.0* 03/30/2012    Lab 03/30/12 1034  NA 141  K 4.4  CL 107  CO2 28  BUN 10  CREATININE 1.2  CALCIUM 8.8  PROT --  BILITOT --  ALKPHOS --  ALT --    AST --  GLUCOSE 91    Radiology/Studies No results found.   Assessment and Plan 1. PPM battery at [facility] [name] PPM battery is at [facility]. He is not PPM dependent. He presents today for PPM generator change, as previously discussed with Dr. Ladona Ridgel 03/03/2012. The procedure was reviewed in detail, including risks and benefits, with [name] and his family. These risks include, but are not limited to, bleeding and/or infection. [name] expressed verbal understanding and agrees to proceed.   Signed, Rick Duff, PA-C 04/06/2012, 9:03 AM

## 2012-04-07 NOTE — Op Note (Signed)
NAMEFRANS, Matthew              ACCOUNT NO.:  [id number]  MEDICAL RECORD NO.:  [id number]  LOCATION:  [facility]                         FACILITY:  [facility]  PHYSICIAN:  Doylene Canning. Ladona Ridgel, MD    DATE OF BIRTH:  April 23, 1924  DATE OF PROCEDURE:  04/06/2012 DATE OF DISCHARGE:  04/06/2012                              OPERATIVE REPORT   PROCEDURE PERFORMED:  Removal of a previously implanted dual-chamber pacemaker, insertion of single-chamber pacemaker.  INDICATION:  Symptomatic bradycardia, now chronic atrial fibrillation, status post pacemaker with device elective replacement.  INTRODUCTION:  The patient is an 78 year old man with chronic atrial fibrillation status post pacemaker insertion who has reached elective replacement.  He is now referred for removal of his old pacemaker and insertion of a new one.  PROCEDURE:  After informed consent was obtained, the patient was taken to the diagnostic EP lab in a fasting state.  After usual preparation and draping, intravenous fentanyl and midazolam was given for sedation. 30 mL of lidocaine was infiltrated into the left infraclavicular region. A 5-cm incision was carried out over this region.  Electrocautery was utilized to dissect down to the fascial plane.  The pacemaker pocket was entered with electrocautery.  Generator was removed with gentle traction.  The leads were evaluated.  The atrial lead was capped secondary to atrial fibrillation.  The ventricular lead was evaluated. R-waves were 27.  The pacing impedance was 400 ohms.  The threshold 0.5 V at 0.5 msec.  With these satisfactory parameters, the new Medtronic Adapta single chamber pacemaker, serial number [id number] was connected to the ventricular lead and placed back in the subcutaneous pocket.  The pocket was irrigated with antibiotic irrigation.  The incision was closed with 2-0 and 3-0 Vicryl.  Benzoin and Steri-Strips were painted on the skin, pressure dressing applied, and the  patient was returned to his room in a satisfactory condition.  COMPLICATIONS:  There were no immediate procedure complications.  RESULTS:  This demonstrates successful implantation of a new single- chamber pacemaker and removal of an old dual-chamber pacemaker without immediate procedural complications.     Doylene Canning. Ladona Ridgel, MD     GWT/MEDQ  D:  04/06/2012  T:  04/07/2012  Job:  [id number]

## 2012-04-12 NOTE — H&P (Signed)
EP Attending  Patient seen and examined. Agree with the above exam assessment and plan. For PPM generator change.  Leonia Reeves.D.

## 2012-04-17 ENCOUNTER — Encounter: Payer: Self-pay | Admitting: *Deleted

## 2012-04-22 ENCOUNTER — Ambulatory Visit (INDEPENDENT_AMBULATORY_CARE_PROVIDER_SITE_OTHER): Payer: Medicare Other | Admitting: *Deleted

## 2012-04-22 ENCOUNTER — Encounter: Payer: Self-pay | Admitting: Internal Medicine

## 2012-04-22 DIAGNOSIS — I4891 Unspecified atrial fibrillation: Secondary | ICD-10-CM

## 2012-04-22 LAB — PACEMAKER DEVICE OBSERVATION
BATTERY VOLTAGE: 2.8 V
BRDY-0002RV: 60 {beats}/min
RV LEAD IMPEDENCE PM: 517 Ohm
RV LEAD THRESHOLD: 1 V

## 2012-04-22 NOTE — Progress Notes (Signed)
Wound check-PPM 

## 2012-04-22 NOTE — Patient Instructions (Addendum)
Return office visit 07/07/12@ 11:30am with Dr. Ladona Ridgel.

## 2012-06-20 ENCOUNTER — Emergency Department (INDEPENDENT_AMBULATORY_CARE_PROVIDER_SITE_OTHER)
Admission: EM | Admit: 2012-06-20 | Discharge: 2012-06-20 | Disposition: A | Discharge status: Home / Self Care | Payer: Medicare Other | Source: Home / Self Care | Attending: Emergency Medicine | Admitting: Emergency Medicine

## 2012-06-20 ENCOUNTER — Encounter (HOSPITAL_COMMUNITY): Payer: Self-pay | Admitting: *Deleted

## 2012-06-20 DIAGNOSIS — H6120 Impacted cerumen, unspecified ear: Secondary | ICD-10-CM

## 2012-06-20 NOTE — ED Provider Notes (Signed)
Chief Complaint  Patient presents with  . Ear Fullness    History of Present Illness:   [name] is an 79 year old male who has had a one-month history of left ear congestion and pain. He has difficulty hearing out of the ear. He denies any drainage. The right ear has been normal. He has had some rhinorrhea, watery eyes, and a slight cough. He denies any fever, chills, or headache.  Review of Systems:  Other than noted above, the patient denies any of the following symptoms: Systemic:  No fevers, chills, sweats, weight loss or gain, fatigue, or tiredness. Eye:  No redness, pain, discharge, itching, blurred vision, or diplopia. ENT:  No headache, nasal congestion, sneezing, itching, epistaxis, ear pain, congestion, decreased hearing, ringing in ears, vertigo, or tinnitus.  No oral lesions, sore throat, pain on swallowing, or hoarseness. Neck:  No mass, tenderness or adenopathy. Lungs:  No coughing, wheezing, or shortness of breath. Skin:  No rash or itching.  PMFSH:  Past medical history, family history, social history, meds, and allergies were reviewed.  Physical Exam:   Vital signs:  BP 125/69  Pulse 83  Temp 97.4 F (36.3 C) (Oral)  Resp 16  SpO2 100% General:  Alert and oriented.  In no distress.  Skin warm and dry. Eye:  PERRL, full EOMs, lids and conjunctiva normal.   ENT:  There was a large cerumen impaction in the left ear canal. This was irrigated clear and thereafter the TMs and canals were clear.  Nasal mucosa not congested and without drainage.  Mucous membranes moist, no oral lesions, normal dentition, pharynx clear.  No cranial or facial pain to palplation. Neck:  Supple, full ROM.  No adenopathy, tenderness or mass.  Thyroid normal. Lungs:  Breath sounds clear and equal bilaterally.  No wheezes, rales or rhonchi. Heart:  Rhythm regular, without extrasystoles.  No gallops or murmers. Skin:  Clear, warm and dry.  Assessment:  The encounter diagnosis was Cerumen  impaction.  Plan:   1.  The following meds were prescribed:   New Prescriptions   No medications on file   2.  The patient was instructed in symptomatic care and handouts were given. I suggested avoidance of Q-tips in the ears and use of Debrox ear drops monthly.  3.  The patient was told to return if becoming worse in any way, if no better in 3 or 4 days, and given some red flag symptoms that would indicate earlier return.    Reuben Likes, MD 06/20/12 1540

## 2012-06-20 NOTE — ED Notes (Signed)
Ear irrigation in progress.

## 2012-06-20 NOTE — ED Notes (Signed)
C/O intermittent left ear pains with decreased hearing and feeling of wax build-up.  Also has some fullness in right ear.

## 2012-07-07 ENCOUNTER — Encounter: Payer: Self-pay | Admitting: Internal Medicine

## 2012-07-07 ENCOUNTER — Ambulatory Visit (INDEPENDENT_AMBULATORY_CARE_PROVIDER_SITE_OTHER): Payer: Medicare Other | Admitting: Internal Medicine

## 2012-07-07 VITALS — BP 140/84 | HR 95 | Ht 71.0 in | Wt 168.0 lb

## 2012-07-07 DIAGNOSIS — Z950 Presence of cardiac pacemaker: Secondary | ICD-10-CM

## 2012-07-07 DIAGNOSIS — I4891 Unspecified atrial fibrillation: Secondary | ICD-10-CM

## 2012-07-07 LAB — PACEMAKER DEVICE OBSERVATION
BATTERY VOLTAGE: 2.79 V
RV LEAD AMPLITUDE: 31.36 mv
RV LEAD IMPEDENCE PM: 511 Ohm
RV LEAD THRESHOLD: 0.75 V
VENTRICULAR PACING PM: 50.7

## 2012-07-07 NOTE — Progress Notes (Signed)
HPI [name] returns today for followup. He is a very pleasant 79 year old man with chronic atrial fibrillation and symptomatic bradycardia, status post permanent pacemaker insertion. In the interim, he has done well. He denies problems with his incision healing. He denies syncope, chest pain, shortness of breath, or palpitations. No peripheral edema. No Known Allergies   Current Outpatient Prescriptions  Medication Sig Dispense Refill  . diltiazem (CARDIZEM LA) 240 MG 24 hr tablet Take 240 mg by mouth daily.        Marland Kitchen latanoprost (XALATAN) 0.005 % ophthalmic solution Place 1 drop into the left eye at bedtime.       Marland Kitchen losartan-hydrochlorothiazide (HYZAAR) 100-12.5 MG per tablet Take 1 tablet by mouth daily. 1/2 po daily      . metoprolol tartrate (LOPRESSOR) 25 MG tablet Take 25 mg by mouth 2 (two) times daily.        . Tamsulosin HCl (FLOMAX) 0.4 MG CAPS Take 0.4 mg by mouth daily.      Marland Kitchen warfarin (COUMADIN) 5 MG tablet Take 2.5 mg by mouth daily.       No current facility-administered medications for this visit.     Past Medical History  Diagnosis Date  . Tachycardia-bradycardia syndrome   . Atrial fibrillation   . Hypertension   . Diabetes mellitus   . Hx SBO   . Ventral hernia   . Prostate cancer     ROS:   All systems reviewed and negative except as noted in the HPI.   Past Surgical History  Procedure Laterality Date  . Exploratory laparotomy    . Small intestine surgery  09/16/2004    SBO resection, VH repair  . Hemorrhoid surgery    . Hiatal hernia repair  2002  . Pacemaker insertion    . Cholecystectomy open  03/31/2001    Dr [name]     Family History  Problem Relation Age of Onset  . Pneumonia Father   . Cancer Brother     type unknown     History   Social History  . Marital Status: Widowed    Spouse Name: N/A    Number of Children: N/A  . Years of Education: N/A   Occupational History  . Not on file.   Social History Main Topics  . Smoking  status: Former Smoker -- 1.50 packs/day    Quit date: 04/16/1955  . Smokeless tobacco: Not on file  . Alcohol Use: No  . Drug Use: No  . Sexually Active: Not on file   Other Topics Concern  . Not on file   Social History Narrative  . No narrative on file     BP 140/84  Pulse 95  Ht 5\' 11"  (1.803 m)  Wt 168 lb (76.204 kg)  BMI 23.44 kg/m2  SpO2 98%  Physical Exam:  Well appearing elderly man,NAD HEENT: Unremarkable Neck:  7 cm JVD, no thyromegally Lungs:  Clear with no wheezes, rales, or rhonchi. HEART:  IRegular rate rhythm, no murmurs, no rubs, no clicks Abd:  soft, positive bowel sounds, no organomegally, no rebound, no guarding Ext:  2 plus pulses, no edema, no cyanosis, no clubbing Skin:  No rashes no nodules Neuro:  CN II through XII intact, motor grossly intact   DEVICE  Normal device function.  See PaceArt for details.   Assess/Plan:

## 2012-07-07 NOTE — Assessment & Plan Note (Signed)
His symptoms are well-controlled. He will continue a combination of calcium channel blockers as well as beta blockers. He will continue systemic anticoagulation with warfarin.

## 2012-07-07 NOTE — Assessment & Plan Note (Signed)
His Medtronic single-chamber pacemaker is working normally. He is pacing approximately 50% of the time. We'll plan to recheck in several months.

## 2012-07-07 NOTE — Patient Instructions (Addendum)
Remote monitoring is used to monitor your Pacemaker of ICD from home. This monitoring reduces the number of office visits required to check your device to one time per year. It allows [place] to keep an eye on the functioning of your device to ensure it is working properly. You are scheduled for a device check from home on Oct 05, 2012. You may send your transmission at any time that day. If you have a wireless device, the transmission will be sent automatically. After your physician reviews your transmission, you will receive a postcard with your next transmission date.  Your physician wants you to follow-up in: 9 months  with Dr Ladona Ridgel.  You will receive a reminder letter in the mail two months in advance. If you don't receive a letter, please call our office to schedule the follow-up appointment.

## 2012-07-08 ENCOUNTER — Encounter: Payer: Self-pay | Admitting: Internal Medicine

## 2012-07-27 DIAGNOSIS — H02409 Unspecified ptosis of unspecified eyelid: Secondary | ICD-10-CM | POA: Insufficient documentation

## 2012-08-07 ENCOUNTER — Encounter (HOSPITAL_COMMUNITY): Payer: Self-pay | Admitting: Emergency Medicine

## 2012-08-07 ENCOUNTER — Emergency Department (HOSPITAL_COMMUNITY)
Admission: EM | Admit: 2012-08-07 | Discharge: 2012-08-08 | Disposition: A | Discharge status: Home / Self Care | Payer: Medicare Other | Attending: Emergency Medicine | Admitting: Emergency Medicine

## 2012-08-07 ENCOUNTER — Emergency Department (HOSPITAL_COMMUNITY): Payer: Medicare Other

## 2012-08-07 DIAGNOSIS — Z8546 Personal history of malignant neoplasm of prostate: Secondary | ICD-10-CM | POA: Insufficient documentation

## 2012-08-07 DIAGNOSIS — Z7901 Long term (current) use of anticoagulants: Secondary | ICD-10-CM | POA: Insufficient documentation

## 2012-08-07 DIAGNOSIS — I10 Essential (primary) hypertension: Secondary | ICD-10-CM | POA: Insufficient documentation

## 2012-08-07 DIAGNOSIS — Z8679 Personal history of other diseases of the circulatory system: Secondary | ICD-10-CM | POA: Insufficient documentation

## 2012-08-07 DIAGNOSIS — Z9089 Acquired absence of other organs: Secondary | ICD-10-CM | POA: Insufficient documentation

## 2012-08-07 DIAGNOSIS — Z79899 Other long term (current) drug therapy: Secondary | ICD-10-CM | POA: Insufficient documentation

## 2012-08-07 DIAGNOSIS — Z8719 Personal history of other diseases of the digestive system: Secondary | ICD-10-CM | POA: Insufficient documentation

## 2012-08-07 DIAGNOSIS — Z9889 Other specified postprocedural states: Secondary | ICD-10-CM | POA: Insufficient documentation

## 2012-08-07 DIAGNOSIS — E119 Type 2 diabetes mellitus without complications: Secondary | ICD-10-CM | POA: Insufficient documentation

## 2012-08-07 DIAGNOSIS — Z87891 Personal history of nicotine dependence: Secondary | ICD-10-CM | POA: Insufficient documentation

## 2012-08-07 DIAGNOSIS — R1031 Right lower quadrant pain: Secondary | ICD-10-CM | POA: Insufficient documentation

## 2012-08-07 DIAGNOSIS — R109 Unspecified abdominal pain: Secondary | ICD-10-CM

## 2012-08-07 LAB — COMPREHENSIVE METABOLIC PANEL
Albumin: 3.6 g/dL (ref 3.5–5.2)
BUN: 12 mg/dL (ref 6–23)
Chloride: 100 mEq/L (ref 96–112)
Creatinine, Ser: 1.61 mg/dL — ABNORMAL HIGH (ref 0.50–1.35)
Total Bilirubin: 0.4 mg/dL (ref 0.3–1.2)

## 2012-08-07 LAB — CBC WITH DIFFERENTIAL/PLATELET
Basophils Relative: 0 % (ref 0–1)
Eosinophils Absolute: 0 10*3/uL (ref 0.0–0.7)
Eosinophils Relative: 0 % (ref 0–5)
HCT: 33.8 % — ABNORMAL LOW (ref 39.0–52.0)
Hemoglobin: 11 g/dL — ABNORMAL LOW (ref 13.0–17.0)
MCH: 24.6 pg — ABNORMAL LOW (ref 26.0–34.0)
MCHC: 32.5 g/dL (ref 30.0–36.0)
MCV: 75.4 fL — ABNORMAL LOW (ref 78.0–100.0)
Monocytes Absolute: 0.4 10*3/uL (ref 0.1–1.0)
Monocytes Relative: 4 % (ref 3–12)
Neutro Abs: 8.6 10*3/uL — ABNORMAL HIGH (ref 1.7–7.7)

## 2012-08-07 LAB — URINALYSIS, ROUTINE W REFLEX MICROSCOPIC
Ketones, ur: NEGATIVE mg/dL
Leukocytes, UA: NEGATIVE
Nitrite: NEGATIVE
Protein, ur: NEGATIVE mg/dL
pH: 6 (ref 5.0–8.0)

## 2012-08-07 LAB — LIPASE, BLOOD: Lipase: 31 U/L (ref 11–59)

## 2012-08-07 MED ORDER — SODIUM CHLORIDE 0.9 % IV BOLUS (SEPSIS)
500.0000 mL | Freq: Once | INTRAVENOUS | Status: AC
  Administered 2012-08-07: 500 mL via INTRAVENOUS

## 2012-08-07 MED ORDER — HYDROMORPHONE HCL PF 1 MG/ML IJ SOLN
0.5000 mg | Freq: Once | INTRAMUSCULAR | Status: AC
  Administered 2012-08-07: 0.5 mg via INTRAVENOUS
  Filled 2012-08-07: qty 1

## 2012-08-07 MED ORDER — IOHEXOL 300 MG/ML  SOLN
100.0000 mL | Freq: Once | INTRAMUSCULAR | Status: AC | PRN
  Administered 2012-08-07: 80 mL via INTRAVENOUS

## 2012-08-07 MED ORDER — ONDANSETRON HCL 4 MG/2ML IJ SOLN
4.0000 mg | Freq: Once | INTRAMUSCULAR | Status: AC
  Administered 2012-08-07: 4 mg via INTRAVENOUS
  Filled 2012-08-07: qty 2

## 2012-08-07 NOTE — ED Provider Notes (Addendum)
History     CSN: [id number]  Arrival date & time 08/07/12  4098   First MD Initiated Contact with Patient 08/07/12 2039      Chief Complaint  Patient presents with  . Abdominal Pain    (Consider location/radiation/quality/duration/timing/severity/associated sxs/prior treatment) HPI.... right lower quadrant pain for past 36 hours. Normal urination and bowel movement. Past surgical history for hiatal hernia and cholecystectomy.  Nothing makes symptoms better or worse. Severity is moderate. No radiation of pain  Past Medical History  Diagnosis Date  . Tachycardia-bradycardia syndrome   . Atrial fibrillation   . Hypertension   . Diabetes mellitus   . Hx SBO   . Ventral hernia   . Prostate cancer     Past Surgical History  Procedure Laterality Date  . Exploratory laparotomy    . Small intestine surgery  09/16/2004    SBO resection, VH repair  . Hemorrhoid surgery    . Hiatal hernia repair  2002  . Pacemaker insertion    . Cholecystectomy open  03/31/2001    Dr Wiliam Ke    Family History  Problem Relation Age of Onset  . Pneumonia Father   . Cancer Brother     type unknown    History  Substance Use Topics  . Smoking status: Former Smoker -- 1.50 packs/day    Quit date: 04/16/1955  . Smokeless tobacco: Not on file  . Alcohol Use: No      Review of Systems  All other systems reviewed and are negative.    Allergies  Review of patient's allergies indicates no known allergies.  Home Medications   Current Outpatient Rx  Name  Route  Sig  Dispense  Refill  . diltiazem (CARDIZEM LA) 240 MG 24 hr tablet   Oral   Take 240 mg by mouth daily.           Marland Kitchen losartan-hydrochlorothiazide (HYZAAR) 100-12.5 MG per tablet   Oral   Take 1 tablet by mouth daily. 1/2 po daily         . metoprolol tartrate (LOPRESSOR) 25 MG tablet   Oral   Take 25 mg by mouth 2 (two) times daily.           . Tamsulosin HCl (FLOMAX) 0.4 MG CAPS   Oral   Take 0.4 mg by mouth  daily.         Marland Kitchen warfarin (COUMADIN) 5 MG tablet   Oral   Take 2.5 mg by mouth daily.           BP 131/94  Pulse 113  Temp(Src) 98 F (36.7 C) (Oral)  Resp 20  Wt 165 lb (74.844 kg)  BMI 23.02 kg/m2  SpO2 96%  Physical Exam  Nursing note and vitals reviewed. Constitutional: He is oriented to person, place, and time. He appears well-developed and well-nourished.  HENT:  Head: Normocephalic and atraumatic.  Eyes: Conjunctivae and EOM are normal. Pupils are equal, round, and reactive to light.  Neck: Normal range of motion. Neck supple.  Cardiovascular: Normal rate, regular rhythm and normal heart sounds.   Pulmonary/Chest: Effort normal and breath sounds normal.  Abdominal:  Moderate tenderness right lower quadrant  Musculoskeletal: Normal range of motion.  Neurological: He is alert and oriented to person, place, and time.  Skin: Skin is warm and dry.  Psychiatric: He has a normal mood and affect.    ED Course  Procedures (including critical care time)  Labs Reviewed  CBC WITH DIFFERENTIAL - Abnormal; Notable  for the following:    Hemoglobin 11.0 (*)    HCT 33.8 (*)    MCV 75.4 (*)    MCH 24.6 (*)    RDW 15.8 (*)    Neutrophils Relative 90 (*)    Neutro Abs 8.6 (*)    Lymphocytes Relative 6 (*)    Lymphs Abs 0.6 (*)    All other components within normal limits  COMPREHENSIVE METABOLIC PANEL - Abnormal; Notable for the following:    Glucose, Bld 126 (*)    Creatinine, Ser 1.61 (*)    AST 54 (*)    GFR calc non Af Amer 37 (*)    GFR calc Af Amer 43 (*)    All other components within normal limits  LIPASE, BLOOD  URINALYSIS, ROUTINE W REFLEX MICROSCOPIC   Ct Abdomen Pelvis W Contrast  08/07/2012  *RADIOLOGY REPORT*  Clinical Data: Right-sided abdominal pain  CT ABDOMEN AND PELVIS WITH CONTRAST  Technique:  Multidetector CT imaging of the abdomen and pelvis was performed following the standard protocol during bolus administration of intravenous contrast.   Contrast: 80mL OMNIPAQUE IOHEXOL 300 MG/ML  SOLN  Comparison: 02/18/2012 radiograph, 06/10/2010 CT  Findings: Moderate hiatal hernia.  Small pericardial effusion. Partially imaged cardiac wire terminating within the right ventricle.  Lung bases are predominately clear. Small fat containing right Bochdalek hernia.  Lobular hepatic contour. 8 mm hypodensity within the periphery of the right hepatic lobe is unchanged.  Absent gallbladder. No biliary ductal dilatation.  Unremarkable spleen.  Metallic clips along the medial margin. Unremarkable pancreas and adrenal glands.  Subcentimeter hypodensities with the left kidney are too small to further characterize, unchanged.  There is heterogeneous attenuation of the right kidney with large areas of hypoenhancement, predominately wedge-shaped, suggesting infarction or infection.  There are a couple rounded too small further characterize hypodensities that appear similar to prior. No hydronephrosis or hydroureter. Delayed excretion on the right.  Circumferential rectal wall thickening is nonspecific given incomplete distension.  No overt CT evidence for colitis.  Small bowel anastomotic suture within the anterior pelvis.  There are small bowel containing periumbilical hernias. An additional right ventral small bowel containing hernia on image 27.  There is dilatation of proximal small bowel up to 5.5 cm, with air-fluid levels.  Distal-most small bowel loops are decompressed. Transition point may be at the site of the ventral periumbilical hernia as seen on sagittal image 60.  No free intraperitoneal air or fluid.  No lymphadenopathy.  Cystic density right para-aortic subdiaphragmatic on image 14 is unchanged.  There is scattered atherosclerotic calcification of the aorta and its branches. No aneurysmal dilatation.  No overt occlusion of the right renal artery or vein.  Mild bladder wall thickening, nonspecific given incomplete distension.  Postoperative changes involving the  prostate gland.  Multilevel degenerative changes of the imaged spine. No acute or aggressive appearing osseous lesion.  IMPRESSION: Wedge-shaped areas of hypo enhancement of the right kidney, most in keeping with infarction (favored) versus pyelonephritis.  Correlate with urinalysis.  The main right renal artery and vein appear patent.  There is delayed excretion.  No hydronephrosis or hydroureter.  Dilated small bowel loops up to 5.5 cm.  There are small bowel containing ventral hernias along the umbilicus, suspicious for the point of obstruction.   Original Report Authenticated By: Jearld Lesch, M.D.      No diagnosis found.    MDM  Will consult urology.  Recheck at 0005:  No acute abdomen. Feeling better. Discussed CT  findings with the urologist on call. He does not think CT scanning results correlate with clinical findings. Patient feeling much better. No acute abdomen.      Donnetta Hutching, MD 08/07/12 2340  Donnetta Hutching, MD 08/24/12 1430

## 2012-08-07 NOTE — ED Notes (Signed)
Pt c/o n/v/d onset yesterday, last diarrhea this am, last emesis noon today. C/o right sided abd pain, describes as "knot". PWD

## 2012-08-07 NOTE — ED Notes (Signed)
Patient transported to CT 

## 2012-08-07 NOTE — ED Notes (Signed)
Patient with vomiting, right sided abdominal pain since last night.  Pain came on suddenly and is constant.  Patient has vomited x 5 since illness began.  Denies fever, chills, or body aches.

## 2012-08-08 NOTE — ED Notes (Signed)
Pt verbalizes unsderstanding

## 2012-08-08 NOTE — ED Provider Notes (Deleted)
History     CSN: [id number]  Arrival date & time 08/07/12  8469   First MD Initiated Contact with Patient 08/07/12 2039      Chief Complaint  Patient presents with  . Abdominal Pain    (Consider location/radiation/quality/duration/timing/severity/associated sxs/prior treatment) HPI  Past Medical History  Diagnosis Date  . Tachycardia-bradycardia syndrome   . Atrial fibrillation   . Hypertension   . Diabetes mellitus   . Hx SBO   . Ventral hernia   . Prostate cancer     Past Surgical History  Procedure Laterality Date  . Exploratory laparotomy    . Small intestine surgery  09/16/2004    SBO resection, VH repair  . Hemorrhoid surgery    . Hiatal hernia repair  2002  . Pacemaker insertion    . Cholecystectomy open  03/31/2001    Dr Wiliam Ke    Family History  Problem Relation Age of Onset  . Pneumonia Father   . Cancer Brother     type unknown    History  Substance Use Topics  . Smoking status: Former Smoker -- 1.50 packs/day    Quit date: 04/16/1955  . Smokeless tobacco: Not on file  . Alcohol Use: No      Review of Systems  Allergies  Review of patient's allergies indicates no known allergies.  Home Medications   Current Outpatient Rx  Name  Route  Sig  Dispense  Refill  . diltiazem (CARDIZEM LA) 240 MG 24 hr tablet   Oral   Take 240 mg by mouth daily.           Marland Kitchen losartan-hydrochlorothiazide (HYZAAR) 100-12.5 MG per tablet   Oral   Take 1 tablet by mouth daily. 1/2 po daily         . metoprolol tartrate (LOPRESSOR) 25 MG tablet   Oral   Take 25 mg by mouth 2 (two) times daily.           . Tamsulosin HCl (FLOMAX) 0.4 MG CAPS   Oral   Take 0.4 mg by mouth daily.         Marland Kitchen warfarin (COUMADIN) 5 MG tablet   Oral   Take 2.5 mg by mouth daily.           BP 131/94  Pulse 113  Temp(Src) 98 F (36.7 C) (Oral)  Resp 20  Wt 165 lb (74.844 kg)  BMI 23.02 kg/m2  SpO2 96%  Physical Exam  ED Course  Procedures (including  critical care time)  Labs Reviewed  CBC WITH DIFFERENTIAL - Abnormal; Notable for the following:    Hemoglobin 11.0 (*)    HCT 33.8 (*)    MCV 75.4 (*)    MCH 24.6 (*)    RDW 15.8 (*)    Neutrophils Relative 90 (*)    Neutro Abs 8.6 (*)    Lymphocytes Relative 6 (*)    Lymphs Abs 0.6 (*)    All other components within normal limits  COMPREHENSIVE METABOLIC PANEL - Abnormal; Notable for the following:    Glucose, Bld 126 (*)    Creatinine, Ser 1.61 (*)    AST 54 (*)    GFR calc non Af Amer 37 (*)    GFR calc Af Amer 43 (*)    All other components within normal limits  LIPASE, BLOOD  URINALYSIS, ROUTINE W REFLEX MICROSCOPIC   Ct Abdomen Pelvis W Contrast  08/07/2012  *RADIOLOGY REPORT*  Clinical Data: Right-sided abdominal pain  CT ABDOMEN  AND PELVIS WITH CONTRAST  Technique:  Multidetector CT imaging of the abdomen and pelvis was performed following the standard protocol during bolus administration of intravenous contrast.  Contrast: 80mL OMNIPAQUE IOHEXOL 300 MG/ML  SOLN  Comparison: 02/18/2012 radiograph, 06/10/2010 CT  Findings: Moderate hiatal hernia.  Small pericardial effusion. Partially imaged cardiac wire terminating within the right ventricle.  Lung bases are predominately clear. Small fat containing right Bochdalek hernia.  Lobular hepatic contour. 8 mm hypodensity within the periphery of the right hepatic lobe is unchanged.  Absent gallbladder. No biliary ductal dilatation.  Unremarkable spleen.  Metallic clips along the medial margin. Unremarkable pancreas and adrenal glands.  Subcentimeter hypodensities with the left kidney are too small to further characterize, unchanged.  There is heterogeneous attenuation of the right kidney with large areas of hypoenhancement, predominately wedge-shaped, suggesting infarction or infection.  There are a couple rounded too small further characterize hypodensities that appear similar to prior. No hydronephrosis or hydroureter. Delayed excretion  on the right.  Circumferential rectal wall thickening is nonspecific given incomplete distension.  No overt CT evidence for colitis.  Small bowel anastomotic suture within the anterior pelvis.  There are small bowel containing periumbilical hernias. An additional right ventral small bowel containing hernia on image 27.  There is dilatation of proximal small bowel up to 5.5 cm, with air-fluid levels.  Distal-most small bowel loops are decompressed. Transition point may be at the site of the ventral periumbilical hernia as seen on sagittal image 60.  No free intraperitoneal air or fluid.  No lymphadenopathy.  Cystic density right para-aortic subdiaphragmatic on image 14 is unchanged.  There is scattered atherosclerotic calcification of the aorta and its branches. No aneurysmal dilatation.  No overt occlusion of the right renal artery or vein.  Mild bladder wall thickening, nonspecific given incomplete distension.  Postoperative changes involving the prostate gland.  Multilevel degenerative changes of the imaged spine. No acute or aggressive appearing osseous lesion.  IMPRESSION: Wedge-shaped areas of hypo enhancement of the right kidney, most in keeping with infarction (favored) versus pyelonephritis.  Correlate with urinalysis.  The main right renal artery and vein appear patent.  There is delayed excretion.  No hydronephrosis or hydroureter.  Dilated small bowel loops up to 5.5 cm.  There are small bowel containing ventral hernias along the umbilicus, suspicious for the point of obstruction.   Original Report Authenticated By: Jearld Lesch, M.D.      No diagnosis found.    MDM  Discussed CT scan results with urologist on call.  He does not think patient's chief complaint correlates with CT findings  Recheck at 005: No acute abdomen. Feeling better. Discussed with Dr. Fredderick Severance, MD 08/08/12 [phone]

## 2012-10-05 ENCOUNTER — Encounter: Payer: Medicare Other | Admitting: *Deleted

## 2012-10-09 ENCOUNTER — Encounter: Payer: Self-pay | Admitting: *Deleted

## 2012-10-19 ENCOUNTER — Encounter: Payer: Self-pay | Admitting: Internal Medicine

## 2012-10-19 ENCOUNTER — Ambulatory Visit (INDEPENDENT_AMBULATORY_CARE_PROVIDER_SITE_OTHER): Payer: 59 | Admitting: *Deleted

## 2012-10-19 DIAGNOSIS — I4891 Unspecified atrial fibrillation: Secondary | ICD-10-CM

## 2012-10-19 DIAGNOSIS — Z950 Presence of cardiac pacemaker: Secondary | ICD-10-CM

## 2012-10-19 LAB — REMOTE PACEMAKER DEVICE
BMOD-0005RV: 95 {beats}/min
BRDY-0002RV: 60 {beats}/min
RV LEAD AMPLITUDE: 22.4 mv
RV LEAD IMPEDENCE PM: 461 Ohm
RV LEAD THRESHOLD: 0.875 V

## 2012-11-27 ENCOUNTER — Encounter: Payer: Self-pay | Admitting: *Deleted

## 2013-01-19 ENCOUNTER — Ambulatory Visit (INDEPENDENT_AMBULATORY_CARE_PROVIDER_SITE_OTHER): Payer: 59 | Admitting: *Deleted

## 2013-01-19 DIAGNOSIS — I495 Sick sinus syndrome: Secondary | ICD-10-CM

## 2013-01-19 DIAGNOSIS — Z950 Presence of cardiac pacemaker: Secondary | ICD-10-CM

## 2013-01-29 LAB — REMOTE PACEMAKER DEVICE
BMOD-0003RV: 30
BRDY-0002RV: 60 {beats}/min
BRDY-0004RV: 120 {beats}/min

## 2013-02-09 ENCOUNTER — Encounter: Payer: Self-pay | Admitting: *Deleted

## 2013-02-23 ENCOUNTER — Encounter: Payer: Self-pay | Admitting: Internal Medicine

## 2013-03-05 ENCOUNTER — Encounter (HOSPITAL_COMMUNITY): Payer: Self-pay | Admitting: Emergency Medicine

## 2013-03-05 ENCOUNTER — Emergency Department (HOSPITAL_COMMUNITY): Payer: PRIVATE HEALTH INSURANCE

## 2013-03-05 ENCOUNTER — Emergency Department (HOSPITAL_COMMUNITY)
Admission: EM | Admit: 2013-03-05 | Discharge: 2013-03-05 | Disposition: A | Discharge status: Home / Self Care | Payer: PRIVATE HEALTH INSURANCE | Attending: Emergency Medicine | Admitting: Emergency Medicine

## 2013-03-05 DIAGNOSIS — I4891 Unspecified atrial fibrillation: Secondary | ICD-10-CM | POA: Insufficient documentation

## 2013-03-05 DIAGNOSIS — Z8546 Personal history of malignant neoplasm of prostate: Secondary | ICD-10-CM | POA: Insufficient documentation

## 2013-03-05 DIAGNOSIS — Z79899 Other long term (current) drug therapy: Secondary | ICD-10-CM | POA: Insufficient documentation

## 2013-03-05 DIAGNOSIS — R109 Unspecified abdominal pain: Secondary | ICD-10-CM

## 2013-03-05 DIAGNOSIS — E119 Type 2 diabetes mellitus without complications: Secondary | ICD-10-CM | POA: Insufficient documentation

## 2013-03-05 DIAGNOSIS — I10 Essential (primary) hypertension: Secondary | ICD-10-CM | POA: Insufficient documentation

## 2013-03-05 DIAGNOSIS — K5900 Constipation, unspecified: Secondary | ICD-10-CM

## 2013-03-05 DIAGNOSIS — Z87891 Personal history of nicotine dependence: Secondary | ICD-10-CM | POA: Insufficient documentation

## 2013-03-05 DIAGNOSIS — R5381 Other malaise: Secondary | ICD-10-CM | POA: Insufficient documentation

## 2013-03-05 DIAGNOSIS — R112 Nausea with vomiting, unspecified: Secondary | ICD-10-CM

## 2013-03-05 LAB — COMPREHENSIVE METABOLIC PANEL
AST: 29 U/L (ref 0–37)
Albumin: 4.3 g/dL (ref 3.5–5.2)
Alkaline Phosphatase: 92 U/L (ref 39–117)
Chloride: 103 mEq/L (ref 96–112)
GFR calc Af Amer: 43 mL/min — ABNORMAL LOW (ref >90)
Potassium: 3.8 mEq/L (ref 3.5–5.1)
Total Bilirubin: 0.7 mg/dL (ref 0.3–1.2)
Total Protein: 7.5 g/dL (ref 6.0–8.3)

## 2013-03-05 LAB — CBC WITH DIFFERENTIAL/PLATELET
Basophils Absolute: 0 10*3/uL (ref 0.0–0.1)
Eosinophils Relative: 0 % (ref 0–5)
Hemoglobin: 10.4 g/dL — ABNORMAL LOW (ref 13.0–17.0)
Lymphocytes Relative: 5 % — ABNORMAL LOW (ref 12–46)
MCV: 75.6 fL — ABNORMAL LOW (ref 78.0–100.0)
Monocytes Relative: 6 % (ref 3–12)
Neutrophils Relative %: 89 % — ABNORMAL HIGH (ref 43–77)
Platelets: 129 10*3/uL — ABNORMAL LOW (ref 150–400)
RBC: 4.1 MIL/uL — ABNORMAL LOW (ref 4.22–5.81)
RDW: 16.2 % — ABNORMAL HIGH (ref 11.5–15.5)
WBC: 10 10*3/uL (ref 4.0–10.5)

## 2013-03-05 LAB — URINALYSIS, ROUTINE W REFLEX MICROSCOPIC
Glucose, UA: NEGATIVE mg/dL
Hgb urine dipstick: NEGATIVE
Ketones, ur: NEGATIVE mg/dL
Protein, ur: NEGATIVE mg/dL
Urobilinogen, UA: 1 mg/dL (ref 0.0–1.0)
pH: 7 (ref 5.0–8.0)

## 2013-03-05 MED ORDER — ONDANSETRON HCL 4 MG PO TABS: 4.0000 mg | ORAL_TABLET | Freq: Three times a day (TID) | ORAL | Status: DC | PRN

## 2013-03-05 MED ORDER — MORPHINE SULFATE 4 MG/ML IJ SOLN
4.0000 mg | Freq: Once | INTRAMUSCULAR | Status: AC
  Administered 2013-03-05: 4 mg via INTRAVENOUS
  Filled 2013-03-05: qty 1

## 2013-03-05 MED ORDER — SODIUM CHLORIDE 0.9 % IV SOLN: 1000.0000 mL | INTRAVENOUS | Status: DC

## 2013-03-05 MED ORDER — SODIUM CHLORIDE 0.9 % IV SOLN
1000.0000 mL | Freq: Once | INTRAVENOUS | Status: AC
  Administered 2013-03-05: 1000 mL via INTRAVENOUS

## 2013-03-05 MED ORDER — ONDANSETRON HCL 4 MG/2ML IJ SOLN
4.0000 mg | Freq: Once | INTRAMUSCULAR | Status: AC
  Administered 2013-03-05: 4 mg via INTRAVENOUS
  Filled 2013-03-05: qty 2

## 2013-03-05 NOTE — ED Notes (Signed)
Patient transported to X-ray 

## 2013-03-05 NOTE — ED Provider Notes (Signed)
CSN: [id number]     Arrival date & time 03/05/13  1146 History   First MD Initiated Contact with Patient 03/05/13 1218     Chief Complaint  Patient presents with  . Emesis  . Abdominal Pain   (Consider location/radiation/quality/duration/timing/severity/associated sxs/prior Treatment) HPI Patient has a history of small bowel obstructions. He reports about midnight he started getting abdominal pain and then he started having vomiting about 4-5 times. He states this pain feels like the pain his had before. He states it is severe. He states the pain improves right after he has vomiting and then he gets worse again. He states he feels weak today but he denies dizziness. He states he had a normal bowel movement about 7:30 this morning. He denies any fever. He states his hernia feels the same however his niece states it looks like it's bigger. His pain is currently better.    PCP Dr Audrie Lia  Past Medical History  Diagnosis Date  . Tachycardia-bradycardia syndrome   . Atrial fibrillation   . Hypertension   . Diabetes mellitus   . Hx SBO   . Ventral hernia   . Prostate cancer    Past Surgical History  Procedure Laterality Date  . Exploratory laparotomy    . Small intestine surgery  09/16/2004    SBO resection, VH repair  . Hemorrhoid surgery    . Hiatal hernia repair  2002  . Pacemaker insertion    . Cholecystectomy open  03/31/2001    Dr Wiliam Ke   Family History  Problem Relation Age of Onset  . Pneumonia Father   . Cancer Brother     type unknown   History  Substance Use Topics  . Smoking status: Former Smoker -- 1.50 packs/day    Quit date: 04/16/1955  . Smokeless tobacco: Not on file  . Alcohol Use: No  lives at home Lives alone  Review of Systems  All other systems reviewed and are negative.    Allergies  Review of patient's allergies indicates no known allergies.  Home Medications   Current Outpatient Rx  Name  Route  Sig  Dispense  Refill  . diltiazem  (CARDIZEM LA) 240 MG 24 hr tablet   Oral   Take 240 mg by mouth at bedtime.          Marland Kitchen losartan-hydrochlorothiazide (HYZAAR) 100-12.5 MG per tablet   Oral   Take 1 tablet by mouth at bedtime.          . metoprolol tartrate (LOPRESSOR) 25 MG tablet   Oral   Take 25 mg by mouth 2 (two) times daily.          Marland Kitchen warfarin (COUMADIN) 3 MG tablet   Oral   Take 3 mg by mouth at bedtime.          BP 172/90  Pulse 79  Temp(Src) 98 F (36.7 C) (Oral)  Resp 16  SpO2 100%  Vital signs normal    Physical Exam  Nursing note and vitals reviewed. Constitutional: He is oriented to person, place, and time. He appears well-developed and well-nourished.  Non-toxic appearance. He does not appear ill. No distress.  HENT:  Head: Normocephalic and atraumatic.  Right Ear: External ear normal.  Left Ear: External ear normal.  Nose: Nose normal. No mucosal edema or rhinorrhea.  Mouth/Throat: Mucous membranes are normal. No dental abscesses or uvula swelling.  Dry tongue  Eyes: Conjunctivae and EOM are normal. Pupils are equal, round, and reactive to light.  Neck: Normal range of motion and full passive range of motion without pain. Neck supple.  Cardiovascular: Normal rate, regular rhythm and normal heart sounds.  Exam reveals no gallop and no friction rub.   No murmur heard. Pulmonary/Chest: Effort normal and breath sounds normal. No respiratory distress. He has no wheezes. He has no rhonchi. He has no rales. He exhibits no tenderness and no crepitus.  Abdominal: Soft. Normal appearance and bowel sounds are normal. He exhibits no distension. There is tenderness. There is no rebound and no guarding.  Patient has a broad-based hernia in his mid abdomen just above his umbilicus. It is soft although tender to palpation. He has tenderness in his left lower abdomen, and his diffuse lower abdomen. There is no guarding or rebound. He has active bowel sounds mainly in the right lower abdomen.   Musculoskeletal: Normal range of motion. He exhibits no edema and no tenderness.  Moves all extremities well.   Neurological: He is alert and oriented to person, place, and time. He has normal strength. No cranial nerve deficit.  Skin: Skin is warm, dry and intact. No rash noted. No erythema. No pallor.  Psychiatric: He has a normal mood and affect. His speech is normal and behavior is normal. His mood appears not anxious.    ED Course  Procedures (including critical care time)  Medications  0.9 %  sodium chloride infusion (0 mLs Intravenous Stopped 03/05/13 1428)    Followed by  0.9 %  sodium chloride infusion (not administered)  morphine 4 MG/ML injection 4 mg (4 mg Intravenous Given 03/05/13 1304)  ondansetron (ZOFRAN) injection 4 mg (4 mg Intravenous Given 03/05/13 1303)   We discussed his xray and lab results. Pt is feeling much better.  He asks if this could be from constipation and states he was having constipation before this started. His hernia is still soft and we discussed if it gets painful and hard he needs to be rechecked.    Labs Review Results for orders placed during the hospital encounter of 03/05/13  CBC WITH DIFFERENTIAL      Result Value Range   WBC 10.0  4.0 - 10.5 K/uL   RBC 4.10 (*) 4.22 - 5.81 MIL/uL   Hemoglobin 10.4 (*) 13.0 - 17.0 g/dL   HCT 16.1 (*) 09.6 - 04.5 %   MCV 75.6 (*) 78.0 - 100.0 fL   MCH 25.4 (*) 26.0 - 34.0 pg   MCHC 33.5  30.0 - 36.0 g/dL   RDW 40.9 (*) 81.1 - 91.4 %   Platelets 129 (*) 150 - 400 K/uL   Neutrophils Relative % 89 (*) 43 - 77 %   Lymphocytes Relative 5 (*) 12 - 46 %   Monocytes Relative 6  3 - 12 %   Eosinophils Relative 0  0 - 5 %   Basophils Relative 0  0 - 1 %   Neutro Abs 8.9 (*) 1.7 - 7.7 K/uL   Lymphs Abs 0.5 (*) 0.7 - 4.0 K/uL   Monocytes Absolute 0.6  0.1 - 1.0 K/uL   Eosinophils Absolute 0.0  0.0 - 0.7 K/uL   Basophils Absolute 0.0  0.0 - 0.1 K/uL   Smear Review LARGE PLATELETS PRESENT    COMPREHENSIVE  METABOLIC PANEL      Result Value Range   Sodium 139  135 - 145 mEq/L   Potassium 3.8  3.5 - 5.1 mEq/L   Chloride 103  96 - 112 mEq/L   CO2 26  19 -  32 mEq/L   Glucose, Bld 136 (*) 70 - 99 mg/dL   BUN 15  6 - 23 mg/dL   Creatinine, Ser 4.54 (*) 0.50 - 1.35 mg/dL   Calcium 9.3  8.4 - 09.8 mg/dL   Total Protein 7.5  6.0 - 8.3 g/dL   Albumin 4.3  3.5 - 5.2 g/dL   AST 29  0 - 37 U/L   ALT 12  0 - 53 U/L   Alkaline Phosphatase 92  39 - 117 U/L   Total Bilirubin 0.7  0.3 - 1.2 mg/dL   GFR calc non Af Amer 37 (*) >90 mL/min   GFR calc Af Amer 43 (*) >90 mL/min  URINALYSIS, ROUTINE W REFLEX MICROSCOPIC      Result Value Range   Color, Urine YELLOW  YELLOW   APPearance CLEAR  CLEAR   Specific Gravity, Urine 1.012  1.005 - 1.030   pH 7.0  5.0 - 8.0   Glucose, UA NEGATIVE  NEGATIVE mg/dL   Hgb urine dipstick NEGATIVE  NEGATIVE   Bilirubin Urine NEGATIVE  NEGATIVE   Ketones, ur NEGATIVE  NEGATIVE mg/dL   Protein, ur NEGATIVE  NEGATIVE mg/dL   Urobilinogen, UA 1.0  0.0 - 1.0 mg/dL   Nitrite NEGATIVE  NEGATIVE   Leukocytes, UA NEGATIVE  NEGATIVE   Laboratory interpretation all normal except stable anemia, stable renal insufficiency    Imaging Review Dg Abd Acute W/chest  03/05/2013   CLINICAL DATA:  Abdominal pain, vomiting.  EXAM: ACUTE ABDOMEN SERIES (ABDOMEN 2 VIEW & CHEST 1 VIEW)  COMPARISON:  CT 08/07/2012.  FINDINGS: Mild cardiomegaly. Pacer is in place with leads in the right atrium and right ventricle. Small to moderate-sized hiatal hernia. No confluent opacities in the lungs. No effusions.  Surgical clips from prior cholecystectomy as well as in the left upper quadrant. No evidence of bowel obstruction. Gas within mildly prominent large bowel loops. Moderate stool burden. No free air organomegaly. No suspicious calcification. Degenerative changes in the lumbar spine and hips.  IMPRESSION: No acute findings. No evidence of bowel obstruction or free air.  Mild cardiomegaly.    Electronically Signed   By: Charlett Nose M.D.   On: 03/05/2013 14:22      MDM  patient has history of small bowel obstruction and presents today with similar symptoms however he has improved with IV fluids and minimal pain medications. His x-ray does not show obstruction today. Patient is doing better and feels ready for discharge.    1. Abdominal pain   2. Constipation    New Prescriptions   ONDANSETRON (ZOFRAN) 4 MG TABLET    Take 1 tablet (4 mg total) by mouth every 8 (eight) hours as needed for nausea.    Plan discharge   Devoria Albe, MD, Franz Dell, MD 03/05/13 [phone]

## 2013-03-05 NOTE — ED Notes (Signed)
Pt complaining of abd pain and emesis since midnight last night. Had pain first, then emesis. Hx of obstruction. LBM this am, denies diarrhea. Bowel sounds audible. Pt states pain is constant. Has abd hernia, family states it is more swollen than usual.

## 2013-04-17 ENCOUNTER — Encounter (HOSPITAL_COMMUNITY): Payer: Self-pay | Admitting: Emergency Medicine

## 2013-04-17 ENCOUNTER — Emergency Department (INDEPENDENT_AMBULATORY_CARE_PROVIDER_SITE_OTHER)
Admission: EM | Admit: 2013-04-17 | Discharge: 2013-04-17 | Disposition: A | Discharge status: Home / Self Care | Payer: PRIVATE HEALTH INSURANCE | Source: Home / Self Care | Attending: Emergency Medicine | Admitting: Emergency Medicine

## 2013-04-17 DIAGNOSIS — L03039 Cellulitis of unspecified toe: Secondary | ICD-10-CM

## 2013-04-17 DIAGNOSIS — L03032 Cellulitis of left toe: Secondary | ICD-10-CM

## 2013-04-17 MED ORDER — CEPHALEXIN 500 MG PO CAPS: 500.0000 mg | ORAL_CAPSULE | Freq: Three times a day (TID) | ORAL | Status: DC

## 2013-04-17 MED ORDER — MUPIROCIN 2 % EX OINT: 1.0000 "application " | TOPICAL_OINTMENT | Freq: Three times a day (TID) | CUTANEOUS | Status: DC

## 2013-04-17 NOTE — ED Notes (Signed)
C/o  l  Fourth  Toe  painfull  X  1  Month     Pt    Reports  It     Hurts   When  He  [employer]  On  It       denys  Any   specefic  Injury

## 2013-04-17 NOTE — ED Provider Notes (Signed)
Chief Complaint:   Chief Complaint  Patient presents with  . Toe Pain    History of Present Illness:   [name] is an 79 year old male who's had a one-month history of left fourth toe soreness. He denies any injury. It's been a little bit red and swollen. There's been no purulent drainage. He has had a "ingrown toenail" of this toe before which was operated on. He denies any history of diabetes, numbness, tingling, or circulatory compromise.  Review of Systems:  Other than noted above, the patient denies any of the following symptoms: Systemic:  No fevers, chills, or sweats.  No fatigue or tiredness. Musculoskeletal:  No joint pain, arthritis, bursitis, swelling, or back pain.  Neurological:  No muscular weakness, paresthesias.  PMFSH:  Past medical history, family history, social history, meds, and allergies were reviewed.  No history of gout.   He has no known allergies. He takes diltiazem, losartan/hydrochlorothiazide, metoprolol, and Coumadin. He has a history of tachycardia/bradycardia syndrome, atrial fibrillation, and hypertension. He also has prostate cancer.  Physical Exam:   Vital signs:  BP 116/73  Pulse 85  Temp(Src) 97.5 F (36.4 C) (Oral)  Resp 19  SpO2 100% Gen:  Alert and oriented times 3.  In no distress. Musculoskeletal:  Exam of the foot reveals the left fourth toe has a thickened, mycotic nail. There is also a corn and lots of callus tissue on the toe. Capillary refill was brisk. The toe was warm and pink. There was no evidence of circulatory compromise. He had some erythema around the medial nail fold of the toe. There was no pus. No ingrown toenail.  Otherwise, all joints had a full a ROM with no swelling, bruising or deformity.  No edema, dorsalis pedis pulses and posterior tibial pulses were not felt. Extremities were warm and pink.  Capillary refill was brisk.  Skin:  Clear, warm and dry.  No rash. Neuro:  Alert and oriented times 3.  Muscle strength was  normal.  Sensation was intact to light touch.   Procedure Note:  Verbal informed consent was obtained from the patient.  Risks and benefits were outlined with the patient.  Patient understands and accepts these risks.  Identity of the patient was confirmed verbally and by armband.    Procedure was performed as follows:  The toe was prepped with alcohol and the mycotic nail was debrided, the callus on the tip of the toe was debrided, and the callus tissue on either nail fold was present. After that the toe looked a lot better.  Patient tolerated the procedure well without any immediate complications.  Assessment:  The encounter diagnosis was Paronychia of toe, left.  I think this is just a paronychia caused by a mycotic toenail and possibly also by the callus tissue he has and the tip of the toe. He will need ongoing maintenance by a podiatrist. He does not have an ingrown toenail. The toe nail does not need to be removed.   Plan:   1.  Meds:  The following meds were prescribed:   Discharge Medication List as of 04/17/2013  7:37 PM    START taking these medications   Details  cephALEXin (KEFLEX) 500 MG capsule Take 1 capsule (500 mg total) by mouth 3 (three) times daily., Starting 04/17/2013, Until Discontinued, Normal    mupirocin ointment (BACTROBAN) 2 % Apply 1 application topically 3 (three) times daily., Starting 04/17/2013, Until Discontinued, Normal        2.  Patient  Education/Counseling:  The patient was given appropriate handouts, self care instructions, and instructed in symptomatic relief including rest and activity, elevation, and he was instructed to soak in warm water with Epsom salts twice daily followed by application of the mupirocin ointment.  3.  Follow up:  The patient was told to follow up if no better in 3 to 4 days, if becoming worse in any way, and given some red flag symptoms such as increasing pain or swelling which would prompt immediate return.  Follow up with  Dr. Cristie Hem in 2 weeks.       Reuben Likes, MD 04/17/13 2018

## 2013-05-07 ENCOUNTER — Encounter: Payer: Self-pay | Admitting: *Deleted

## 2013-05-18 ENCOUNTER — Ambulatory Visit (INDEPENDENT_AMBULATORY_CARE_PROVIDER_SITE_OTHER): Payer: PRIVATE HEALTH INSURANCE | Admitting: *Deleted

## 2013-05-18 ENCOUNTER — Encounter: Payer: Self-pay | Admitting: Internal Medicine

## 2013-05-18 DIAGNOSIS — I4891 Unspecified atrial fibrillation: Secondary | ICD-10-CM

## 2013-05-18 DIAGNOSIS — I495 Sick sinus syndrome: Secondary | ICD-10-CM

## 2013-05-26 LAB — MDC_IDC_ENUM_SESS_TYPE_REMOTE
Battery Impedance: 179 Ohm
Battery Remaining Longevity: 107 mo
Battery Voltage: 2.79 V
Brady Statistic RV Percent Paced: 55 %
Date Time Interrogation Session: 20141230211954
Lead Channel Impedance Value: 0 Ohm
Lead Channel Impedance Value: 497 Ohm
Lead Channel Pacing Threshold Amplitude: 0.75 V
Lead Channel Pacing Threshold Pulse Width: 0.4 ms
Lead Channel Sensing Intrinsic Amplitude: 22.4 mV
Lead Channel Setting Pacing Amplitude: 2.5 V
Lead Channel Setting Pacing Pulse Width: 0.4 ms
Lead Channel Setting Sensing Sensitivity: 5.6 mV

## 2013-06-03 ENCOUNTER — Encounter: Payer: Self-pay | Admitting: *Deleted

## 2013-07-08 ENCOUNTER — Encounter: Payer: Self-pay | Admitting: Internal Medicine

## 2013-07-08 ENCOUNTER — Ambulatory Visit (INDEPENDENT_AMBULATORY_CARE_PROVIDER_SITE_OTHER): Payer: PRIVATE HEALTH INSURANCE | Admitting: Internal Medicine

## 2013-07-08 VITALS — BP 108/68 | HR 76 | Ht 71.0 in | Wt 163.0 lb

## 2013-07-08 DIAGNOSIS — I495 Sick sinus syndrome: Secondary | ICD-10-CM

## 2013-07-08 DIAGNOSIS — Z950 Presence of cardiac pacemaker: Secondary | ICD-10-CM

## 2013-07-08 DIAGNOSIS — I4891 Unspecified atrial fibrillation: Secondary | ICD-10-CM

## 2013-07-08 LAB — MDC_IDC_ENUM_SESS_TYPE_INCLINIC
Battery Impedance: 179 Ohm
Battery Remaining Longevity: 107 mo
Battery Voltage: 2.78 V
Brady Statistic RV Percent Paced: 52 %
Date Time Interrogation Session: 20150219113150
Lead Channel Pacing Threshold Amplitude: 0.75 V
Lead Channel Setting Pacing Amplitude: 2.5 V
Lead Channel Setting Pacing Pulse Width: 0.4 ms
Lead Channel Setting Sensing Sensitivity: 5.6 mV
MDC IDC MSMT LEADCHNL RA IMPEDANCE VALUE: 0 Ohm
MDC IDC MSMT LEADCHNL RV IMPEDANCE VALUE: 497 Ohm
MDC IDC MSMT LEADCHNL RV PACING THRESHOLD PULSEWIDTH: 0.4 ms
MDC IDC MSMT LEADCHNL RV SENSING INTR AMPL: 22.4 mV

## 2013-07-08 NOTE — Progress Notes (Signed)
HPI [name] returns today for followup. He is a very pleasant 80 year old man with chronic atrial fibrillation and symptomatic bradycardia, status post permanent pacemaker insertion. In the interim, he has done well. He denies syncope, chest pain, shortness of breath, or palpitations. No peripheral edema. No Known Allergies   Current Outpatient Prescriptions  Medication Sig Dispense Refill  . diltiazem (CARDIZEM LA) 240 MG 24 hr tablet Take 240 mg by mouth at bedtime.       Marland Kitchen losartan-hydrochlorothiazide (HYZAAR) 100-12.5 MG per tablet Take 1 tablet by mouth at bedtime.       . metoprolol tartrate (LOPRESSOR) 25 MG tablet Take 25 mg by mouth 2 (two) times daily.       . mupirocin ointment (BACTROBAN) 2 % Apply 1 application topically 3 (three) times daily.  22 g  0  . warfarin (COUMADIN) 3 MG tablet Take 3 mg by mouth at bedtime.       No current facility-administered medications for this visit.     Past Medical History  Diagnosis Date  . Tachycardia-bradycardia syndrome   . Atrial fibrillation   . Hypertension   . Diabetes mellitus   . Hx SBO   . Ventral hernia   . Prostate cancer     ROS:   All systems reviewed and negative except as noted in the HPI.   Past Surgical History  Procedure Laterality Date  . Exploratory laparotomy    . Small intestine surgery  09/16/2004    SBO resection, VH repair  . Hemorrhoid surgery    . Hiatal hernia repair  2002  . Pacemaker insertion    . Cholecystectomy open  03/31/2001    Dr Lindon Romp     Family History  Problem Relation Age of Onset  . Pneumonia Father   . Cancer Brother     type unknown     History   Social History  . Marital Status: Widowed    Spouse Name: N/A    Number of Children: N/A  . Years of Education: N/A   Occupational History  . Not on file.   Social History Main Topics  . Smoking status: Former Smoker -- 1.50 packs/day    Quit date: 04/16/1955  . Smokeless tobacco: Not on file  . Alcohol Use: No   . Drug Use: No  . Sexual Activity: Not on file   Other Topics Concern  . Not on file   Social History Narrative  . No narrative on file     BP 108/68  Pulse 76  Ht 5\' 11"  (1.803 m)  Wt 163 lb (73.936 kg)  BMI 22.74 kg/m2  Physical Exam:  Well appearing elderly man,NAD HEENT: Unremarkable Neck:  7 cm JVD, no thyromegally Lungs:  Clear with no wheezes, rales, or rhonchi. HEART:  IRegular rate rhythm, no murmurs, no rubs, no clicks Abd:  soft, positive bowel sounds, no organomegally, no rebound, no guarding Ext:  2 plus pulses, no edema, no cyanosis, no clubbing Skin:  No rashes no nodules Neuro:  CN II through XII intact, motor grossly intact   DEVICE  Normal device function.  See PaceArt for details.   Assess/Plan:

## 2013-07-08 NOTE — Assessment & Plan Note (Signed)
His ventricular rate appears to be well controlled. Will follow.

## 2013-07-08 NOTE — Patient Instructions (Signed)
Your physician wants you to follow-up in: 12 months with Dr Knox Saliva will receive a reminder letter in the mail two months in advance. If you don't receive a letter, please call our office to schedule the follow-up appointment.   Remote monitoring is used to monitor your Pacemaker or ICD from home. This monitoring reduces the number of office visits required to check your device to one time per year. It allows [place] to keep an eye on the functioning of your device to ensure it is working properly. You are scheduled for a device check from home on 10/12/13. You may send your transmission at any time that day. If you have a wireless device, the transmission will be sent automatically. After your physician reviews your transmission, you will receive a postcard with your next transmission date.

## 2013-07-08 NOTE — Assessment & Plan Note (Signed)
His [employer] DDD PM (programmed VVI) is working normally. Will recheck in several months.

## 2013-08-17 ENCOUNTER — Emergency Department (HOSPITAL_COMMUNITY): Payer: Medicare Other

## 2013-08-17 ENCOUNTER — Inpatient Hospital Stay (HOSPITAL_COMMUNITY)
Admission: EM | Admit: 2013-08-17 | Discharge: 2013-08-19 | DRG: 389 | Disposition: A | Discharge status: Home / Self Care | Payer: Medicare Other | Attending: Emergency Medicine | Admitting: Emergency Medicine

## 2013-08-17 ENCOUNTER — Encounter (HOSPITAL_COMMUNITY): Payer: Self-pay | Admitting: Emergency Medicine

## 2013-08-17 DIAGNOSIS — I495 Sick sinus syndrome: Secondary | ICD-10-CM | POA: Diagnosis present

## 2013-08-17 DIAGNOSIS — I129 Hypertensive chronic kidney disease with stage 1 through stage 4 chronic kidney disease, or unspecified chronic kidney disease: Secondary | ICD-10-CM | POA: Diagnosis present

## 2013-08-17 DIAGNOSIS — K56609 Unspecified intestinal obstruction, unspecified as to partial versus complete obstruction: Secondary | ICD-10-CM | POA: Diagnosis present

## 2013-08-17 DIAGNOSIS — N183 Chronic kidney disease, stage 3 unspecified: Secondary | ICD-10-CM | POA: Diagnosis present

## 2013-08-17 DIAGNOSIS — R112 Nausea with vomiting, unspecified: Secondary | ICD-10-CM

## 2013-08-17 DIAGNOSIS — Z87891 Personal history of nicotine dependence: Secondary | ICD-10-CM

## 2013-08-17 DIAGNOSIS — E119 Type 2 diabetes mellitus without complications: Secondary | ICD-10-CM | POA: Diagnosis present

## 2013-08-17 DIAGNOSIS — Z79899 Other long term (current) drug therapy: Secondary | ICD-10-CM

## 2013-08-17 DIAGNOSIS — K436 Other and unspecified ventral hernia with obstruction, without gangrene: Secondary | ICD-10-CM | POA: Diagnosis present

## 2013-08-17 DIAGNOSIS — K565 Intestinal adhesions [bands], unspecified as to partial versus complete obstruction: Principal | ICD-10-CM | POA: Diagnosis present

## 2013-08-17 DIAGNOSIS — K439 Ventral hernia without obstruction or gangrene: Secondary | ICD-10-CM

## 2013-08-17 DIAGNOSIS — E876 Hypokalemia: Secondary | ICD-10-CM

## 2013-08-17 DIAGNOSIS — C61 Malignant neoplasm of prostate: Secondary | ICD-10-CM | POA: Diagnosis present

## 2013-08-17 DIAGNOSIS — R109 Unspecified abdominal pain: Secondary | ICD-10-CM

## 2013-08-17 DIAGNOSIS — D630 Anemia in neoplastic disease: Secondary | ICD-10-CM | POA: Diagnosis present

## 2013-08-17 DIAGNOSIS — E860 Dehydration: Secondary | ICD-10-CM | POA: Diagnosis present

## 2013-08-17 DIAGNOSIS — Z7901 Long term (current) use of anticoagulants: Secondary | ICD-10-CM

## 2013-08-17 DIAGNOSIS — R197 Diarrhea, unspecified: Secondary | ICD-10-CM

## 2013-08-17 DIAGNOSIS — I4891 Unspecified atrial fibrillation: Secondary | ICD-10-CM

## 2013-08-17 DIAGNOSIS — Z9049 Acquired absence of other specified parts of digestive tract: Secondary | ICD-10-CM

## 2013-08-17 DIAGNOSIS — Z950 Presence of cardiac pacemaker: Secondary | ICD-10-CM

## 2013-08-17 DIAGNOSIS — I480 Paroxysmal atrial fibrillation: Secondary | ICD-10-CM | POA: Diagnosis present

## 2013-08-17 LAB — COMPREHENSIVE METABOLIC PANEL
ALT: 6 U/L (ref 0–53)
AST: 19 U/L (ref 0–37)
Albumin: 3.2 g/dL — ABNORMAL LOW (ref 3.5–5.2)
Alkaline Phosphatase: 76 U/L (ref 39–117)
BUN: 18 mg/dL (ref 6–23)
CO2: 21 meq/L (ref 19–32)
CREATININE: 1.5 mg/dL — AB (ref 0.50–1.35)
Calcium: 8.1 mg/dL — ABNORMAL LOW (ref 8.4–10.5)
Chloride: 110 mEq/L (ref 96–112)
GFR, EST AFRICAN AMERICAN: 46 mL/min — AB (ref >90)
GFR, EST NON AFRICAN AMERICAN: 40 mL/min — AB (ref >90)
GLUCOSE: 123 mg/dL — AB (ref 70–99)
Potassium: 3.5 mEq/L — ABNORMAL LOW (ref 3.7–5.3)
Sodium: 142 mEq/L (ref 137–147)
Total Bilirubin: 0.3 mg/dL (ref 0.3–1.2)
Total Protein: 6.1 g/dL (ref 6.0–8.3)

## 2013-08-17 LAB — URINALYSIS, ROUTINE W REFLEX MICROSCOPIC
Bilirubin Urine: NEGATIVE
GLUCOSE, UA: NEGATIVE mg/dL
Hgb urine dipstick: NEGATIVE
Ketones, ur: NEGATIVE mg/dL
Leukocytes, UA: NEGATIVE
Nitrite: NEGATIVE
PH: 5 (ref 5.0–8.0)
Protein, ur: NEGATIVE mg/dL
SPECIFIC GRAVITY, URINE: 1.016 (ref 1.005–1.030)
Urobilinogen, UA: 0.2 mg/dL (ref 0.0–1.0)

## 2013-08-17 LAB — CBC WITH DIFFERENTIAL/PLATELET
BASOS ABS: 0 10*3/uL (ref 0.0–0.1)
BASOS PCT: 0 % (ref 0–1)
Eosinophils Absolute: 0 10*3/uL (ref 0.0–0.7)
Eosinophils Relative: 0 % (ref 0–5)
HCT: 31.4 % — ABNORMAL LOW (ref 39.0–52.0)
Hemoglobin: 10.7 g/dL — ABNORMAL LOW (ref 13.0–17.0)
Lymphocytes Relative: 5 % — ABNORMAL LOW (ref 12–46)
Lymphs Abs: 0.5 10*3/uL — ABNORMAL LOW (ref 0.7–4.0)
MCH: 25.7 pg — ABNORMAL LOW (ref 26.0–34.0)
MCHC: 34.1 g/dL (ref 30.0–36.0)
MCV: 75.5 fL — ABNORMAL LOW (ref 78.0–100.0)
MONO ABS: 0.2 10*3/uL (ref 0.1–1.0)
Monocytes Relative: 2 % — ABNORMAL LOW (ref 3–12)
NEUTROS ABS: 9.4 10*3/uL — AB (ref 1.7–7.7)
Neutrophils Relative %: 92 % — ABNORMAL HIGH (ref 43–77)
Platelets: 121 10*3/uL — ABNORMAL LOW (ref 150–400)
RBC: 4.16 MIL/uL — ABNORMAL LOW (ref 4.22–5.81)
RDW: 16.2 % — AB (ref 11.5–15.5)
WBC: 10.2 10*3/uL (ref 4.0–10.5)

## 2013-08-17 LAB — APTT: APTT: 54 s — AB (ref 24–37)

## 2013-08-17 LAB — I-STAT CG4 LACTIC ACID, ED: LACTIC ACID, VENOUS: 0.64 mmol/L (ref 0.5–2.2)

## 2013-08-17 LAB — TROPONIN I: Troponin I: <0.3 ng/mL (ref <0.30)

## 2013-08-17 LAB — LIPASE, BLOOD: Lipase: 17 U/L (ref 11–59)

## 2013-08-17 LAB — PROTIME-INR
INR: 3.61 — ABNORMAL HIGH (ref 0.00–1.49)
Prothrombin Time: 34.6 seconds — ABNORMAL HIGH (ref 11.6–15.2)

## 2013-08-17 LAB — MAGNESIUM: MAGNESIUM: 1.8 mg/dL (ref 1.5–2.5)

## 2013-08-17 MED ORDER — ONDANSETRON HCL 4 MG PO TABS: 4.0000 mg | ORAL_TABLET | Freq: Four times a day (QID) | ORAL | Status: DC | PRN

## 2013-08-17 MED ORDER — POTASSIUM CHLORIDE 10 MEQ/100ML IV SOLN
10.0000 meq | INTRAVENOUS | Status: AC
  Administered 2013-08-17 (×3): 10 meq via INTRAVENOUS
  Filled 2013-08-17 (×3): qty 100

## 2013-08-17 MED ORDER — ONDANSETRON HCL 4 MG/2ML IJ SOLN
4.0000 mg | Freq: Once | INTRAMUSCULAR | Status: AC
  Administered 2013-08-17: 4 mg via INTRAVENOUS
  Filled 2013-08-17: qty 2

## 2013-08-17 MED ORDER — SODIUM CHLORIDE 0.9 % IV BOLUS (SEPSIS)
1000.0000 mL | Freq: Once | INTRAVENOUS | Status: AC
  Administered 2013-08-17: 1000 mL via INTRAVENOUS

## 2013-08-17 MED ORDER — IOHEXOL 300 MG/ML  SOLN
50.0000 mL | Freq: Once | INTRAMUSCULAR | Status: AC | PRN
  Administered 2013-08-17: 50 mL via ORAL

## 2013-08-17 MED ORDER — MORPHINE SULFATE 2 MG/ML IJ SOLN: 1.0000 mg | INTRAMUSCULAR | Status: DC | PRN

## 2013-08-17 MED ORDER — ONDANSETRON HCL 4 MG/2ML IJ SOLN: 4.0000 mg | Freq: Four times a day (QID) | INTRAMUSCULAR | Status: DC | PRN

## 2013-08-17 MED ORDER — SODIUM CHLORIDE 0.9 % IV SOLN
INTRAVENOUS | Status: DC
  Administered 2013-08-17 – 2013-08-19 (×2): via INTRAVENOUS

## 2013-08-17 MED ORDER — WARFARIN - PHARMACIST DOSING INPATIENT: Freq: Every day | Status: DC

## 2013-08-17 NOTE — H&P (Signed)
[facility]          History and Physical    PCP:   Foye Spurling, MD   Chief Complaint:  Abdominal pain, nausea, vomiting  HPI: Patient is a pleasant 80 year old man with a history of atrial fibrillation chronically anticoagulated on Coumadin status post permanent pacemaker, hypertension, prostate cancer. He presents today with nausea and vomiting that began last night, nonbloody, nonbilious as well as diffuse abdominal pain. He has had several prior admissions for small bowel obstruction. He has had extensive prior surgical history that includes cholecystectomy done openly, hiatal hernia repair, also exploratory laparotomy due to to adhesions. CT scan shows small bowel obstruction with 3 ventral hernias and possibly one of them causing obstruction. Surgery has been consulted. Hospitalist admission has been requested.  Allergies:  No Known Allergies    Past Medical History  Diagnosis Date  . Tachycardia-bradycardia syndrome   . Atrial fibrillation   . Hypertension   . Diabetes mellitus   . Hx SBO     Last 2013 all treated conservatively  . Ventral hernia     x3  . Prostate cancer     Past Surgical History  Procedure Laterality Date  . Exploratory laparotomy    . Small intestine surgery  09/16/2004    SBO resection, VH repair  . Hemorrhoid surgery    . Hiatal hernia repair  2002  . Pacemaker insertion    . Cholecystectomy open  03/31/2001    Dr Lindon Romp    Prior to Admission medications   Medication Sig Start Date End Date Taking? Authorizing Provider  diltiazem (CARDIZEM LA) 240 MG 24 hr tablet Take 240 mg by mouth at bedtime.    Yes Historical Provider, MD  losartan-hydrochlorothiazide (HYZAAR) 100-12.5 MG per tablet Take 1 tablet by mouth at bedtime.    Yes Historical Provider, MD  metoprolol tartrate (LOPRESSOR) 25 MG tablet Take 25 mg by mouth 2 (two) times daily.    Yes Historical Provider, MD  pseudoephedrine-acetaminophen (TYLENOL SINUS)  30-500 MG TABS Take 1 tablet by mouth every 4 (four) hours as needed (congestion/headache).   Yes Historical Provider, MD  warfarin (COUMADIN) 3 MG tablet Take 3 mg by mouth every other day.    Yes Historical Provider, MD  warfarin (COUMADIN) 4 MG tablet Take 4 mg by mouth every other day.   Yes Historical Provider, MD    Social History:  reports that he quit smoking about 58 years ago. He quit smokeless tobacco use about 61 years ago. His smokeless tobacco use included Snuff and Chew. He reports that he does not drink alcohol or use illicit drugs.  Family History  Problem Relation Age of Onset  . Pneumonia Father   . Cancer Brother     type unknown    Review of Systems:  Constitutional: Denies fever, chills, diaphoresis, appetite change and fatigue.  HEENT: Denies photophobia, eye pain, redness, hearing loss, ear pain, congestion, sore throat, rhinorrhea, sneezing, mouth sores, trouble swallowing, neck pain, neck stiffness and tinnitus.   Respiratory: Denies SOB, DOE, cough, chest tightness,  and wheezing.   Cardiovascular: Denies chest pain, palpitations and leg swelling.  Gastrointestinal: Denies constipation, blood in stool and abdominal distention.  Genitourinary: Denies dysuria, urgency, frequency, hematuria, flank pain and difficulty urinating.  Endocrine: Denies: hot or cold intolerance, sweats, changes in hair or nails, polyuria, polydipsia. Musculoskeletal: Denies myalgias, back pain, joint swelling, arthralgias and gait problem.  Skin: Denies pallor, rash and wound.  Neurological: Denies dizziness, seizures, syncope, weakness, light-headedness, numbness and headaches.  Hematological: Denies adenopathy. Easy bruising, personal or family bleeding history  Psychiatric/Behavioral: Denies suicidal ideation, mood changes, confusion, nervousness, sleep disturbance and agitation   Physical Exam: Blood pressure 114/51, pulse 80, temperature 98.1 F (36.7 C), temperature source Oral,  resp. rate 16, weight 72.576 kg (160 lb), SpO2 97.00%. General: Alert, awake, oriented x3, in no current distress. HEENT: Normocephalic, atraumatic, pupils equal round and reactive to light, extraocular movements intact. Neck: Supple, no JVD, no lymphadenopathy, no bruits, no goiter. Cardiovascular: Irregular, rate controlled, systolic ejection murmur. Lungs: Clear to auscultation bilaterally. Abdomen: Soft, nondistended, hypoactive bowel sounds, scars from multiple prior surgeries. Extremities: No clubbing, cyanosis or edema, positive pulses. Neurologic: Grossly intact and nonfocal.  Labs on Admission:  Results for orders placed during the hospital encounter of 08/17/13 (from the past 48 hour(s))  PROTIME-INR     Status: Abnormal   Collection Time    08/17/13  9:20 AM      Result Value Ref Range   Prothrombin Time 34.6 (*) 11.6 - 15.2 seconds   INR 3.61 (*) 0.00 - 1.49  APTT     Status: Abnormal   Collection Time    08/17/13  9:20 AM      Result Value Ref Range   aPTT 54 (*) 24 - 37 seconds   Comment:            IF BASELINE aPTT IS ELEVATED,     SUGGEST PATIENT RISK ASSESSMENT     BE USED TO DETERMINE APPROPRIATE     ANTICOAGULANT THERAPY.  I-STAT CG4 LACTIC ACID, ED     Status: None   Collection Time    08/17/13  9:31 AM      Result Value Ref Range   Lactic Acid, Venous 0.64  0.5 - 2.2 mmol/L  URINALYSIS, ROUTINE W REFLEX MICROSCOPIC     Status: None   Collection Time    08/17/13  9:45 AM      Result Value Ref Range   Color, Urine YELLOW  YELLOW   APPearance CLEAR  CLEAR   Specific Gravity, Urine 1.016  1.005 - 1.030   pH 5.0  5.0 - 8.0   Glucose, UA NEGATIVE  NEGATIVE mg/dL   Hgb urine dipstick NEGATIVE  NEGATIVE   Bilirubin Urine NEGATIVE  NEGATIVE   Ketones, ur NEGATIVE  NEGATIVE mg/dL   Protein, ur NEGATIVE  NEGATIVE mg/dL   Urobilinogen, UA 0.2  0.0 - 1.0 mg/dL   Nitrite NEGATIVE  NEGATIVE   Leukocytes, UA NEGATIVE  NEGATIVE   Comment: MICROSCOPIC NOT DONE ON  URINES WITH NEGATIVE PROTEIN, BLOOD, LEUKOCYTES, NITRITE, OR GLUCOSE <1000 mg/dL.  CBC WITH DIFFERENTIAL     Status: Abnormal   Collection Time    08/17/13 11:00 AM      Result Value Ref Range   WBC 10.2  4.0 - 10.5 K/uL   RBC 4.16 (*) 4.22 - 5.81 MIL/uL   Hemoglobin 10.7 (*) 13.0 - 17.0 g/dL   HCT 31.4 (*) 39.0 - 52.0 %   MCV 75.5 (*) 78.0 - 100.0 fL   MCH 25.7 (*) 26.0 - 34.0 pg   MCHC 34.1  30.0 - 36.0 g/dL   RDW 16.2 (*) 11.5 - 15.5 %   Platelets 121 (*) 150 - 400 K/uL   Neutrophils Relative % 92 (*) 43 - 77 %   Neutro Abs 9.4 (*) 1.7 - 7.7 K/uL  Lymphocytes Relative 5 (*) 12 - 46 %   Lymphs Abs 0.5 (*) 0.7 - 4.0 K/uL   Monocytes Relative 2 (*) 3 - 12 %   Monocytes Absolute 0.2  0.1 - 1.0 K/uL   Eosinophils Relative 0  0 - 5 %   Eosinophils Absolute 0.0  0.0 - 0.7 K/uL   Basophils Relative 0  0 - 1 %   Basophils Absolute 0.0  0.0 - 0.1 K/uL  COMPREHENSIVE METABOLIC PANEL     Status: Abnormal   Collection Time    08/17/13 11:00 AM      Result Value Ref Range   Sodium 142  137 - 147 mEq/L   Potassium 3.5 (*) 3.7 - 5.3 mEq/L   Comment: RESULTS VERIFIED VIA RECOLLECT   Chloride 110  96 - 112 mEq/L   CO2 21  19 - 32 mEq/L   Glucose, Bld 123 (*) 70 - 99 mg/dL   BUN 18  6 - 23 mg/dL   Creatinine, Ser 1.50 (*) 0.50 - 1.35 mg/dL   Calcium 8.1 (*) 8.4 - 10.5 mg/dL   Total Protein 6.1  6.0 - 8.3 g/dL   Albumin 3.2 (*) 3.5 - 5.2 g/dL   AST 19  0 - 37 U/L   ALT 6  0 - 53 U/L   Alkaline Phosphatase 76  39 - 117 U/L   Total Bilirubin 0.3  0.3 - 1.2 mg/dL   GFR calc non Af Amer 40 (*) >90 mL/min   GFR calc Af Amer 46 (*) >90 mL/min   Comment: (NOTE)     The eGFR has been calculated using the CKD EPI equation.     This calculation has not been validated in all clinical situations.     eGFR's persistently <90 mL/min signify possible Chronic Kidney     Disease.  LIPASE, BLOOD     Status: None   Collection Time    08/17/13 11:00 AM      Result Value Ref Range   Lipase 17  11 -  59 U/L  TROPONIN I     Status: None   Collection Time    08/17/13 11:00 AM      Result Value Ref Range   Troponin I <0.30  <0.30 ng/mL   Comment:            Due to the release kinetics of cTnI,     a negative result within the first hours     of the onset of symptoms does not rule out     myocardial infarction with certainty.     If myocardial infarction is still suspected,     repeat the test at appropriate intervals.  TROPONIN I     Status: None   Collection Time    08/17/13  3:31 PM      Result Value Ref Range   Troponin I <0.30  <0.30 ng/mL   Comment:            Due to the release kinetics of cTnI,     a negative result within the first hours     of the onset of symptoms does not rule out     myocardial infarction with certainty.     If myocardial infarction is still suspected,     repeat the test at appropriate intervals.    Radiological Exams on Admission: Ct Abdomen Pelvis Wo Contrast  08/17/2013   CLINICAL DATA:  Nausea, vomiting, diarrhea and weakness since yesterday. History of  prostate cancer. Abdominal pain and emesis.  EXAM: CT ABDOMEN AND PELVIS WITHOUT CONTRAST  TECHNIQUE: Multidetector CT imaging of the abdomen and pelvis was performed following the standard protocol without intravenous contrast.  COMPARISON:  DG ABD ACUTE W/CHEST dated 03/05/2013; CT ABD/PELVIS W CM dated 08/07/2012; DG ABD 1 VIEW dated 02/18/2012  FINDINGS: Lung Bases: Dependent atelectasis. Cardiomegaly with small amount of pericardial fluid or thickening. Pacemaker leads visualized.  Liver: Unenhanced CT was performed per clinician order. Lack of IV contrast limits sensitivity and specificity, especially for evaluation of abdominal/pelvic solid viscera. Grossly normal. No interval change compared to prior.  Spleen:  Normal.  Gallbladder:  Surgically absent.  Common bile duct:  Normal.  Pancreas:  Normal.  Adrenal glands:  Normal.  Kidneys: Nonspecific bilateral perinephric stranding. Right renal  scarring associated with prior infarct. The ureters appear within normal limits.  Stomach: Moderate hiatal hernia. No inflammatory changes of stomach.  Small bowel: Duodenum appears within normal limits. There is progressive dilation of small bowel extending into the central abdomen. Multiple ventral hernias are present. In the upper abdomen, there is a dilated loop of small bowel within the hernia however there does not appear to be obstruction of this loop. The appearance of the abdominal wall suggests prior herniorrhaphy. There is an inferior right-sided periumbilical ventral hernia that does show obstruction although not complete. Small amount of contrast is present distally. Small bowel anastomotic staple line is present in the anatomic pelvis.  Colon: Normal appendix. Fluid is present within the colon which is nonspecific. There is a Forensic psychologist hernia involving proximal transverse colon (image 35 series 2) without obstruction. Fluid extends to the rectosigmoid.  Pelvic Genitourinary: Urinary bladder shows spinal lying compatible with TURP. Prostate brachytherapy seeds are present. No free fluid.  Bones: Osteopenia. No aggressive osseous lesions. Small stable sclerotic focus in the medial left iliac bone probably represents a bone island.  Vasculature: Atherosclerosis. Stable dilation of the cisterna chyli at the level of the diaphragmatic hiatus.  Body Wall: Ventral hernias described above under the small bowel and colon sections.  IMPRESSION: 1. Three ventral hernias, the more inferior right periumbilical hernia which appears to be partially obstructing with relative decompression of distal small bowel. Some contrast does pass through the herniated bowel. Recommend assessment for reducible hernia. The appearance is very similar to the prior exam 08/07/2012. 2. Postoperative changes of bowel. 3. Expected evolution of right renal infarct. 4. Cholecystectomy. 5. Moderate hiatal hernia. 6. Fluid levels within the  colon are nonspecific and may be secondary to bowel obstruction.   Electronically Signed   By: Dereck Ligas M.D.   On: 08/17/2013 14:37    Assessment/Plan Principal Problem:   SBO (small bowel obstruction) Active Problems:   Ventral hernia with obstruction   Atrial fibrillation   PPM-[employer]   Hypokalemia     Recurrent small bowel obstruction -Probably related to ventral hernias. -Place NG tube, keep n.p.o. -Surgical consultation requested.  Atrial fibrillation -Rate controlled. -Hold by mouth meds. -INR is supratherapeutic at 3.61, will ask pharmacy to help Coumadin dosing.  Hypokalemia -May be contributing to SBO. -Replete IV, check magnesium level and replete as necessary.  DVT prophylaxis -On full dose anticoagulation.  CODE STATUS -Full code as discussed with the niece Tye [place] at bedside.   Time Spent on Admission: 80 minutes  HERNANDEZ ACOSTA,ESTELA [facility] Pager: [phone] 08/17/2013, 6:31 PM

## 2013-08-17 NOTE — ED Notes (Signed)
Receiving nurse not available to take report at this time.

## 2013-08-17 NOTE — ED Notes (Signed)
Patient reports he started with nausea, vomiting, and diarrhea yesterday around noon with diffuse abdominal pain.  Patient reports feeling weak.  Denies cough or fever but does report a slight cold.

## 2013-08-17 NOTE — Consult Note (Signed)
[facility] CONSULT Reason for consult: nausea and vomiting and abdominal pain Referring Physician: ER  [name] is an 80 y.o. male.  HPI: he has multiple medical problems including atrial fibrillation and is anticoagulated. He has tachycardia bradycardia syndrome required pacemaker. Other problems include diabetes, hypertension, and prostate cancer. He said several previous admissions for SBO. He said extensive prior surgical history that includes cholecystectomy done openly, hiatal hernia repair, both of these by Dr. Lindon Romp. He's also had small bowel resection exploratory laparotomy duty adhesions. She came to the emergency room with abdominal pain and nausea and vomiting spin going on for about 36 hours. He said some diarrhea is reported to be nonbloody. He states in his been having gas and stool without problems recently. NG tube is placed in the emergency room and is training large amount of clear liquid. CT of the abdomen show's multiple ventral hernia is with periumbilical hernia showing some hangup of contrast consistent with a partial obstruction. These apparently were all easily reducible  Past Medical History  Diagnosis Date  . Tachycardia-bradycardia syndrome   . Atrial fibrillation   . Hypertension   . Diabetes mellitus   . Hx SBO     Last 2013 all treated conservatively  . Ventral hernia     x3  . Prostate cancer     Past Surgical History  Procedure Laterality Date  . Exploratory laparotomy    . Small intestine surgery  09/16/2004    SBO resection, VH repair  . Hemorrhoid surgery    . Hiatal hernia repair  2002  . Pacemaker insertion    . Cholecystectomy open  03/31/2001    Dr Lindon Romp    Family History  Problem Relation Age of Onset  . Pneumonia Father   . Cancer Brother     type unknown    Social History:  reports that he quit smoking about 58 years ago. He quit smokeless tobacco use about 61 years ago. His smokeless tobacco use included Snuff and  Chew. He reports that he does not drink alcohol or use illicit drugs.  Allergies: No Known Allergies  Medications; Prior to Admission medications   Medication Sig Start Date End Date Taking? Authorizing Provider  diltiazem (CARDIZEM LA) 240 MG 24 hr tablet Take 240 mg by mouth at bedtime.    Yes Historical Provider, MD  losartan-hydrochlorothiazide (HYZAAR) 100-12.5 MG per tablet Take 1 tablet by mouth at bedtime.    Yes Historical Provider, MD  metoprolol tartrate (LOPRESSOR) 25 MG tablet Take 25 mg by mouth 2 (two) times daily.    Yes Historical Provider, MD  pseudoephedrine-acetaminophen (TYLENOL SINUS) 30-500 MG TABS Take 1 tablet by mouth every 4 (four) hours as needed (congestion/headache).   Yes Historical Provider, MD  warfarin (COUMADIN) 3 MG tablet Take 3 mg by mouth every other day.    Yes Historical Provider, MD  warfarin (COUMADIN) 4 MG tablet Take 4 mg by mouth every other day.   Yes Historical Provider, MD    PRN Meds    Results for orders placed during the hospital encounter of 08/17/13 (from the past 48 hour(s))  PROTIME-INR     Status: Abnormal   Collection Time    08/17/13  9:20 AM      Result Value Ref Range   Prothrombin Time 34.6 (*) 11.6 - 15.2 seconds   INR 3.61 (*) 0.00 - 1.49  APTT     Status: Abnormal   Collection Time    08/17/13  9:20  AM      Result Value Ref Range   aPTT 54 (*) 24 - 37 seconds   Comment:            IF BASELINE aPTT IS ELEVATED,     SUGGEST PATIENT RISK ASSESSMENT     BE USED TO DETERMINE APPROPRIATE     ANTICOAGULANT THERAPY.  I-STAT CG4 LACTIC ACID, ED     Status: None   Collection Time    08/17/13  9:31 AM      Result Value Ref Range   Lactic Acid, Venous 0.64  0.5 - 2.2 mmol/L  URINALYSIS, ROUTINE W REFLEX MICROSCOPIC     Status: None   Collection Time    08/17/13  9:45 AM      Result Value Ref Range   Color, Urine YELLOW  YELLOW   APPearance CLEAR  CLEAR   Specific Gravity, Urine 1.016  1.005 - 1.030   pH 5.0  5.0 - 8.0    Glucose, UA NEGATIVE  NEGATIVE mg/dL   Hgb urine dipstick NEGATIVE  NEGATIVE   Bilirubin Urine NEGATIVE  NEGATIVE   Ketones, ur NEGATIVE  NEGATIVE mg/dL   Protein, ur NEGATIVE  NEGATIVE mg/dL   Urobilinogen, UA 0.2  0.0 - 1.0 mg/dL   Nitrite NEGATIVE  NEGATIVE   Leukocytes, UA NEGATIVE  NEGATIVE   Comment: MICROSCOPIC NOT DONE ON URINES WITH NEGATIVE PROTEIN, BLOOD, LEUKOCYTES, NITRITE, OR GLUCOSE <1000 mg/dL.  CBC WITH DIFFERENTIAL     Status: Abnormal   Collection Time    08/17/13 11:00 AM      Result Value Ref Range   WBC 10.2  4.0 - 10.5 K/uL   RBC 4.16 (*) 4.22 - 5.81 MIL/uL   Hemoglobin 10.7 (*) 13.0 - 17.0 g/dL   HCT 31.4 (*) 39.0 - 52.0 %   MCV 75.5 (*) 78.0 - 100.0 fL   MCH 25.7 (*) 26.0 - 34.0 pg   MCHC 34.1  30.0 - 36.0 g/dL   RDW 16.2 (*) 11.5 - 15.5 %   Platelets 121 (*) 150 - 400 K/uL   Neutrophils Relative % 92 (*) 43 - 77 %   Neutro Abs 9.4 (*) 1.7 - 7.7 K/uL   Lymphocytes Relative 5 (*) 12 - 46 %   Lymphs Abs 0.5 (*) 0.7 - 4.0 K/uL   Monocytes Relative 2 (*) 3 - 12 %   Monocytes Absolute 0.2  0.1 - 1.0 K/uL   Eosinophils Relative 0  0 - 5 %   Eosinophils Absolute 0.0  0.0 - 0.7 K/uL   Basophils Relative 0  0 - 1 %   Basophils Absolute 0.0  0.0 - 0.1 K/uL  COMPREHENSIVE METABOLIC PANEL     Status: Abnormal   Collection Time    08/17/13 11:00 AM      Result Value Ref Range   Sodium 142  137 - 147 mEq/L   Potassium 3.5 (*) 3.7 - 5.3 mEq/L   Comment: RESULTS VERIFIED VIA RECOLLECT   Chloride 110  96 - 112 mEq/L   CO2 21  19 - 32 mEq/L   Glucose, Bld 123 (*) 70 - 99 mg/dL   BUN 18  6 - 23 mg/dL   Creatinine, Ser 1.50 (*) 0.50 - 1.35 mg/dL   Calcium 8.1 (*) 8.4 - 10.5 mg/dL   Total Protein 6.1  6.0 - 8.3 g/dL   Albumin 3.2 (*) 3.5 - 5.2 g/dL   AST 19  0 - 37 U/L   ALT  6  0 - 53 U/L   Alkaline Phosphatase 76  39 - 117 U/L   Total Bilirubin 0.3  0.3 - 1.2 mg/dL   GFR calc non Af Amer 40 (*) >90 mL/min   GFR calc Af Amer 46 (*) >90 mL/min   Comment:  (NOTE)     The eGFR has been calculated using the CKD EPI equation.     This calculation has not been validated in all clinical situations.     eGFR's persistently <90 mL/min signify possible Chronic Kidney     Disease.  LIPASE, BLOOD     Status: None   Collection Time    08/17/13 11:00 AM      Result Value Ref Range   Lipase 17  11 - 59 U/L  TROPONIN I     Status: None   Collection Time    08/17/13 11:00 AM      Result Value Ref Range   Troponin I <0.30  <0.30 ng/mL   Comment:            Due to the release kinetics of cTnI,     a negative result within the first hours     of the onset of symptoms does not rule out     myocardial infarction with certainty.     If myocardial infarction is still suspected,     repeat the test at appropriate intervals.  TROPONIN I     Status: None   Collection Time    08/17/13  3:31 PM      Result Value Ref Range   Troponin I <0.30  <0.30 ng/mL   Comment:            Due to the release kinetics of cTnI,     a negative result within the first hours     of the onset of symptoms does not rule out     myocardial infarction with certainty.     If myocardial infarction is still suspected,     repeat the test at appropriate intervals.    Ct Abdomen Pelvis Wo Contrast  08/17/2013   CLINICAL DATA:  Nausea, vomiting, diarrhea and weakness since yesterday. History of prostate cancer. Abdominal pain and emesis.  EXAM: CT ABDOMEN AND PELVIS WITHOUT CONTRAST  TECHNIQUE: Multidetector CT imaging of the abdomen and pelvis was performed following the standard protocol without intravenous contrast.  COMPARISON:  DG ABD ACUTE W/CHEST dated 03/05/2013; CT ABD/PELVIS W CM dated 08/07/2012; DG ABD 1 VIEW dated 02/18/2012  FINDINGS: Lung Bases: Dependent atelectasis. Cardiomegaly with small amount of pericardial fluid or thickening. Pacemaker leads visualized.  Liver: Unenhanced CT was performed per clinician order. Lack of IV contrast limits sensitivity and specificity,  especially for evaluation of abdominal/pelvic solid viscera. Grossly normal. No interval change compared to prior.  Spleen:  Normal.  Gallbladder:  Surgically absent.  Common bile duct:  Normal.  Pancreas:  Normal.  Adrenal glands:  Normal.  Kidneys: Nonspecific bilateral perinephric stranding. 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 The ureters appear within normal limits.  Stomach: Moderate hiatal hernia. No inflammatory changes of stomach.  Small bowel: Duodenum appears within normal limits. There is progressive dilation of small bowel extending into the central abdomen. Multiple ventral hernias are present. In the upper abdomen, there is a dilated loop of small bowel within the hernia however there does not appear to be obstruction of this loop. The appearance of the abdominal wall suggests prior herniorrhaphy. There is an inferior right-sided  periumbilical ventral hernia that does show obstruction although not complete. Small amount of contrast is present distally. Small bowel anastomotic staple line is present in the anatomic pelvis.  Colon: Normal appendix. Fluid is present within the colon which is nonspecific. There is a Forensic psychologist hernia involving proximal transverse colon (image 35 series 2) without obstruction. Fluid extends to the rectosigmoid.  Pelvic Genitourinary: Urinary bladder shows spinal lying compatible with TURP. Prostate brachytherapy seeds are present. No free fluid.  Bones: Osteopenia. No aggressive osseous lesions. Small stable sclerotic focus in the medial left iliac bone probably represents a bone island.  Vasculature: Atherosclerosis. Stable dilation of the cisterna chyli at the level of the diaphragmatic hiatus.  Body Wall: Ventral hernias described above under the small bowel and colon sections.  IMPRESSION: 1. Three ventral hernias, the more inferior right periumbilical hernia which appears to be partially obstructing with relative decompression of distal small bowel. Some  contrast does pass through the herniated bowel. Recommend assessment for reducible hernia. The appearance is very similar to the prior exam 08/07/2012. 2. Postoperative changes of bowel. 3. Expected evolution of right renal infarct. 4. Cholecystectomy. 5. Moderate hiatal hernia. 6. Fluid levels within the colon are nonspecific and may be secondary to bowel obstruction.   Electronically Signed   By: Dereck Ligas M.D.   On: 08/17/2013 14:37               Blood pressure 114/51, pulse 80, temperature 98.1 F (36.7 C), temperature source Oral, resp. rate 16, weight 72.576 kg (160 lb), SpO2 97.00%.  Physical exam:   General-- pleasant African-American male. NG tube training clear brown liquid Heart-- regular rate and rhythm without murmurs are gallops Lungs--clear Abdomen-- minimally distended with good bowel sounds. Multiple surgical scars with obvious hernia defects with sitting with hernias appear to be easily reducible   Assessment: 1. Nausea and vomiting. Probable intermittent small bowel obstruction due to his hernias adhesions. Appears to be doing much better with NG suction. 2. Multiple ventral hernias. These are chronic 3. Atrial fibrillation anticoagulated  Plan: did not feel that there is urgent need for gastroenterology at this time. He does not appear to need urgent surgery. Would continue NG suction and please call us back if he develops G.I. bleeding or some other problem to would require endoscopic intervention.   Mayola Mcbain JR,Rahm Minix L 08/17/2013, 5:13 PM

## 2013-08-17 NOTE — ED Notes (Signed)
Pt to CT

## 2013-08-17 NOTE — ED Provider Notes (Signed)
CSN: [id number]     Arrival date & time 08/17/13  [id number] History   First MD Initiated Contact with Patient 08/17/13 0830     Chief Complaint  Patient presents with  . Emesis     (Consider location/radiation/quality/duration/timing/severity/associated sxs/prior Treatment) The history is provided by the patient. No language interpreter was used.  [name] is an 80 y/o M with PMHx of Afib on coumadin, history of SBO, tachycardia-bradycardia syndrome with PM placement in 2006 and then replaced in 2014, DM, HTN, prostate cancer presenting to the ED with generalized abdominal pain, nausea, vomiting, diarrhea that started yesterday at approximately 12:00PM. Patient reported that the abdominal pain is mainly localized to just below the navel and lower quadrants described as a dull pain - as if he is gaseous. Reported that he has been having feelings of nausea with at least 5-6 episodes of emesis yesterday - NB/NB. Reported that he has had numerous episodes of diarrhea yesterday and this morning - reported to be watery and non-bloody with mucus. Stated that he lives in a house by himself. Reported that he has been passing gas without difficulty. Reported that he has been having mild sweats. Denied fever, chills, chest pain, shortness of breath, difficulty breathing, dizziness, falls, injuries.  PCP Dr. Carlis Abbott Cardio Dr. Lovena Le  Past Medical History  Diagnosis Date  . Tachycardia-bradycardia syndrome   . Atrial fibrillation   . Hypertension   . Diabetes mellitus   . Hx SBO     Last 2013 all treated conservatively  . Ventral hernia     x3  . Prostate cancer    Past Surgical History  Procedure Laterality Date  . Exploratory laparotomy    . Small intestine surgery  09/16/2004    SBO resection, VH repair  . Hemorrhoid surgery    . Hiatal hernia repair  2002  . Pacemaker insertion    . Cholecystectomy open  03/31/2001    Dr Lindon Romp   Family History  Problem Relation Age of Onset  .  Pneumonia Father   . Cancer Brother     type unknown   History  Substance Use Topics  . Smoking status: Former Smoker -- 1.50 packs/day    Quit date: 04/16/1955  . Smokeless tobacco: Former Systems developer    Types: Snuff, Sarina Ser    Quit date: 08/17/1952  . Alcohol Use: No    Review of Systems  Constitutional: Negative for fever and chills.  Eyes: Negative for visual disturbance.  Respiratory: Negative for chest tightness and shortness of breath.   Cardiovascular: Negative for chest pain.  Gastrointestinal: Positive for nausea, vomiting, abdominal pain and diarrhea. Negative for constipation, blood in stool and anal bleeding.  Musculoskeletal: Negative for back pain and neck pain.  Neurological: Positive for weakness (generalized). Negative for dizziness and numbness.  All other systems reviewed and are negative.      Allergies  Review of patient's allergies indicates no known allergies.  Home Medications   Current Outpatient Rx  Name  Route  Sig  Dispense  Refill  . diltiazem (CARDIZEM LA) 240 MG 24 hr tablet   Oral   Take 240 mg by mouth at bedtime.          Marland Kitchen losartan-hydrochlorothiazide (HYZAAR) 100-12.5 MG per tablet   Oral   Take 1 tablet by mouth at bedtime.          . metoprolol tartrate (LOPRESSOR) 25 MG tablet   Oral   Take 25 mg by mouth 2 (two)  times daily.          . pseudoephedrine-acetaminophen (TYLENOL SINUS) 30-500 MG TABS   Oral   Take 1 tablet by mouth every 4 (four) hours as needed (congestion/headache).         . warfarin (COUMADIN) 3 MG tablet   Oral   Take 3 mg by mouth every other day.          . warfarin (COUMADIN) 4 MG tablet   Oral   Take 4 mg by mouth every other day.          BP 114/51  Pulse 80  Temp(Src) 98.1 F (36.7 C) (Oral)  Resp 16  Wt 160 lb (72.576 kg)  SpO2 97% Physical Exam  Nursing note and vitals reviewed. Constitutional: He is oriented to person, place, and time. He appears well-developed and  well-nourished. No distress.  HENT:  Head: Normocephalic and atraumatic.  Mouth/Throat: Oropharynx is clear and moist. No oropharyngeal exudate.  Eyes: Conjunctivae and EOM are normal. Pupils are equal, round, and reactive to light. Right eye exhibits no discharge. Left eye exhibits no discharge.  Neck: Normal range of motion. Neck supple. No tracheal deviation present.  Cardiovascular: Normal rate, regular rhythm and normal heart sounds.  Exam reveals no friction rub.   No murmur heard. Pulses:      Radial pulses are 2+ on the right side, and 2+ on the left side.       Dorsalis pedis pulses are 2+ on the right side, and 2+ on the left side.  Pulmonary/Chest: Effort normal and breath sounds normal. No respiratory distress. He has no wheezes. He has no rales.  Abdominal: Soft. Normal appearance. There is generalized tenderness. There is no guarding.  Minimally diminished BS Generalized tenderness upon palpation to the abdomen Positive Murphy's sign Well healed scars noted to the abdomen  Ventral hernia noted - soft upon palpation   Musculoskeletal: Normal range of motion.  Full ROM to upper and lower extremities without difficulty noted, negative ataxia noted.  Lymphadenopathy:    He has no cervical adenopathy.  Neurological: He is alert and oriented to person, place, and time. No cranial nerve deficit. He exhibits normal muscle tone. Coordination normal.  Cranial nerves III-XII grossly intact Strength 5+/5+ to upper and lower extremities bilaterally with resistance applied, equal distribution noted Equal grip strength  Skin: Skin is warm and dry. No rash noted. He is not diaphoretic. No erythema.  Psychiatric: He has a normal mood and affect. His behavior is normal. Thought content normal.    ED Course  Procedures (including critical care time)  2:53 PM This provider spoke with Dr. Oletta Lamas - GI - discussed case, history, presentation, labs, imaging. Consult. Recommended Surgery  consult.   3:33 PM This provider spoke with Dr. Jerilee Hoh, [facility] - discussed case, history, presentation, labs, imaging in great detail. Recommended Surgery to be consulted and for patient to be admitted to the [facility] floor.   4:13 PM This provider spoke with Megan from [facility] - discussed case, history, presentation, labs and imaging. Patient to be seen and assessed by surgery. Recommended NG tube to be placed. Will see patient.   Results for orders placed during the hospital encounter of 08/17/13  URINALYSIS, ROUTINE W REFLEX MICROSCOPIC      Result Value Ref Range   Color, Urine YELLOW  YELLOW   APPearance CLEAR  CLEAR   Specific Gravity, Urine 1.016  1.005 - 1.030   pH 5.0  5.0 - 8.0  Glucose, UA NEGATIVE  NEGATIVE mg/dL   Hgb urine dipstick NEGATIVE  NEGATIVE   Bilirubin Urine NEGATIVE  NEGATIVE   Ketones, ur NEGATIVE  NEGATIVE mg/dL   Protein, ur NEGATIVE  NEGATIVE mg/dL   Urobilinogen, UA 0.2  0.0 - 1.0 mg/dL   Nitrite NEGATIVE  NEGATIVE   Leukocytes, UA NEGATIVE  NEGATIVE  PROTIME-INR      Result Value Ref Range   Prothrombin Time 34.6 (*) 11.6 - 15.2 seconds   INR 3.61 (*) 0.00 - 1.49  APTT      Result Value Ref Range   aPTT 54 (*) 24 - 37 seconds  CBC WITH DIFFERENTIAL      Result Value Ref Range   WBC 10.2  4.0 - 10.5 K/uL   RBC 4.16 (*) 4.22 - 5.81 MIL/uL   Hemoglobin 10.7 (*) 13.0 - 17.0 g/dL   HCT 31.4 (*) 39.0 - 52.0 %   MCV 75.5 (*) 78.0 - 100.0 fL   MCH 25.7 (*) 26.0 - 34.0 pg   MCHC 34.1  30.0 - 36.0 g/dL   RDW 16.2 (*) 11.5 - 15.5 %   Platelets 121 (*) 150 - 400 K/uL   Neutrophils Relative % 92 (*) 43 - 77 %   Neutro Abs 9.4 (*) 1.7 - 7.7 K/uL   Lymphocytes Relative 5 (*) 12 - 46 %   Lymphs Abs 0.5 (*) 0.7 - 4.0 K/uL   Monocytes Relative 2 (*) 3 - 12 %   Monocytes Absolute 0.2  0.1 - 1.0 K/uL   Eosinophils Relative 0  0 - 5 %   Eosinophils Absolute 0.0  0.0 - 0.7 K/uL   Basophils Relative 0  0 - 1 %   Basophils Absolute 0.0  0.0 - 0.1 K/uL   COMPREHENSIVE METABOLIC PANEL      Result Value Ref Range   Sodium 142  137 - 147 mEq/L   Potassium 3.5 (*) 3.7 - 5.3 mEq/L   Chloride 110  96 - 112 mEq/L   CO2 21  19 - 32 mEq/L   Glucose, Bld 123 (*) 70 - 99 mg/dL   BUN 18  6 - 23 mg/dL   Creatinine, Ser 1.50 (*) 0.50 - 1.35 mg/dL   Calcium 8.1 (*) 8.4 - 10.5 mg/dL   Total Protein 6.1  6.0 - 8.3 g/dL   Albumin 3.2 (*) 3.5 - 5.2 g/dL   AST 19  0 - 37 U/L   ALT 6  0 - 53 U/L   Alkaline Phosphatase 76  39 - 117 U/L   Total Bilirubin 0.3  0.3 - 1.2 mg/dL   GFR calc non Af Amer 40 (*) >90 mL/min   GFR calc Af Amer 46 (*) >90 mL/min  LIPASE, BLOOD      Result Value Ref Range   Lipase 17  11 - 59 U/L  TROPONIN I      Result Value Ref Range   Troponin I <0.30  <0.30 ng/mL  TROPONIN I      Result Value Ref Range   Troponin I <0.30  <0.30 ng/mL  I-STAT CG4 LACTIC ACID, ED      Result Value Ref Range   Lactic Acid, Venous 0.64  0.5 - 2.2 mmol/L    Labs Review Labs Reviewed  PROTIME-INR - Abnormal; Notable for the following:    Prothrombin Time 34.6 (*)    INR 3.61 (*)    All other components within normal limits  APTT - Abnormal; Notable for  the following:    aPTT 54 (*)    All other components within normal limits  CBC WITH DIFFERENTIAL - Abnormal; Notable for the following:    RBC 4.16 (*)    Hemoglobin 10.7 (*)    HCT 31.4 (*)    MCV 75.5 (*)    MCH 25.7 (*)    RDW 16.2 (*)    Platelets 121 (*)    Neutrophils Relative % 92 (*)    Neutro Abs 9.4 (*)    Lymphocytes Relative 5 (*)    Lymphs Abs 0.5 (*)    Monocytes Relative 2 (*)    All other components within normal limits  COMPREHENSIVE METABOLIC PANEL - Abnormal; Notable for the following:    Potassium 3.5 (*)    Glucose, Bld 123 (*)    Creatinine, Ser 1.50 (*)    Calcium 8.1 (*)    Albumin 3.2 (*)    GFR calc non Af Amer 40 (*)    GFR calc Af Amer 46 (*)    All other components within normal limits  URINALYSIS, ROUTINE W REFLEX MICROSCOPIC  LIPASE, BLOOD   TROPONIN I  TROPONIN I  CBC WITH DIFFERENTIAL  PROTIME-INR  CBC WITH DIFFERENTIAL  COMPREHENSIVE METABOLIC PANEL  I-STAT CG4 LACTIC ACID, ED   Imaging Review Ct Abdomen Pelvis Wo Contrast  08/17/2013   CLINICAL DATA:  Nausea, vomiting, diarrhea and weakness since yesterday. History of prostate cancer. Abdominal pain and emesis.  EXAM: CT ABDOMEN AND PELVIS WITHOUT CONTRAST  TECHNIQUE: Multidetector CT imaging of the abdomen and pelvis was performed following the standard protocol without intravenous contrast.  COMPARISON:  DG ABD ACUTE W/CHEST dated 03/05/2013; CT ABD/PELVIS W CM dated 08/07/2012; DG ABD 1 VIEW dated 02/18/2012  FINDINGS: Lung Bases: Dependent atelectasis. Cardiomegaly with small amount of pericardial fluid or thickening. Pacemaker leads visualized.  Liver: Unenhanced CT was performed per clinician order. Lack of IV contrast limits sensitivity and specificity, especially for evaluation of abdominal/pelvic solid viscera. Grossly normal. No interval change compared to prior.  Spleen:  Normal.  Gallbladder:  Surgically absent.  Common bile duct:  Normal.  Pancreas:  Normal.  Adrenal glands:  Normal.  Kidneys: Nonspecific bilateral perinephric stranding. 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 The ureters appear within normal limits.  Stomach: Moderate hiatal hernia. No inflammatory changes of stomach.  Small bowel: Duodenum appears within normal limits. There is progressive dilation of small bowel extending into the central abdomen. Multiple ventral hernias are present. In the upper abdomen, there is a dilated loop of small bowel within the hernia however there does not appear to be obstruction of this loop. The appearance of the abdominal wall suggests prior herniorrhaphy. There is an inferior right-sided periumbilical ventral hernia that does show obstruction although not complete. Small amount of contrast is present distally. Small bowel anastomotic staple line is present in  the anatomic pelvis.  Colon: Normal appendix. Fluid is present within the colon which is nonspecific. There is a Forensic psychologist hernia involving proximal transverse colon (image 35 series 2) without obstruction. Fluid extends to the rectosigmoid.  Pelvic Genitourinary: Urinary bladder shows spinal lying compatible with TURP. Prostate brachytherapy seeds are present. No free fluid.  Bones: Osteopenia. No aggressive osseous lesions. Small stable sclerotic focus in the medial left iliac bone probably represents a bone island.  Vasculature: Atherosclerosis. Stable dilation of the cisterna chyli at the level of the diaphragmatic hiatus.  Body Wall: Ventral hernias described above under the small bowel and colon sections.  IMPRESSION: 1. Three ventral hernias, the more inferior right periumbilical hernia which appears to be partially obstructing with relative decompression of distal small bowel. Some contrast does pass through the herniated bowel. Recommend assessment for reducible hernia. The appearance is very similar to the prior exam 08/07/2012. 2. Postoperative changes of bowel. 3. Expected evolution of right renal infarct. 4. Cholecystectomy. 5. Moderate hiatal hernia. 6. Fluid levels within the colon are nonspecific and may be secondary to bowel obstruction.   Electronically Signed   By: Dereck Ligas M.D.   On: 08/17/2013 14:37     EKG Interpretation   Date/Time:  Tuesday August 17 2013 09:12:06 EDT Ventricular Rate:  79 PR Interval:  216 QRS Duration: 93 QT Interval:  377 QTC Calculation: 432 R Axis:   13 Text Interpretation:  A fib with Ventricular-paced complexes No further  rhythm analysis attempted due to paced rhythm Borderline prolonged PR  interval Anteroseptal infarct, old Repol abnrm, severe global ischemia  (LM/MVD) New TWI in I, II, V3, otherwise similar Confirmed by DOCHERTY   MD, MEGAN [phone]) on 08/17/2013 9:21:57 AM      MDM   Final diagnoses:  Ventral hernia  SBO (small bowel  obstruction)   Medications  sodium chloride 0.9 % bolus 1,000 mL (0 mLs Intravenous Stopped 08/17/13 1110)  ondansetron (ZOFRAN) injection 4 mg (4 mg Intravenous Given 08/17/13 0953)  iohexol (OMNIPAQUE) 300 MG/ML solution 50 mL (50 mLs Oral Contrast Given 08/17/13 1221)   Filed Vitals:   08/17/13 0930 08/17/13 1000 08/17/13 1049 08/17/13 1537  BP: 114/59 119/65 105/61 114/51  Pulse: 53 63 103 80  Temp:    98.1 F (36.7 C)  TempSrc:    Oral  Resp: 11 16 18 16   Weight:      SpO2: 99% 96%  97%    Patient presenting to the ED with abdominal pain that is described as a dull aching pain with associated nausea, vomiting, and diarrhea that started yesterday around 12:00PM.  Alert and oriented. GCS 15. Heart rate and rhythm normal - PM noted. Radial and DP pulses 2+ bilaterally. Lungs clear to auscultation. Negative abdominal distension noted. BS normoactive in all 4 quadrants. Discomfort upon palpation to the abdomen - generalized, soft upon palpation. Full ROM to upper and lower extremities bilaterally without difficulty or ataxia. Equal grip strength noted.  EKG noted Afib with Ventricular paced complexes - borderline prolonged QT intervals. First troponin negative elevation. Second troponin negative elevation. CBC negative elevated white blood cell count-elevated neutrophils of 92. CMP noted mildly low potassium at 3.5. Creatinine mildly elevated at 1.50-when compared to 5 months ago patient's creatinine was approximately 1.60. Lipase negative elevation. Lactic acid negative elevation. INR and PT elevated-patient supratherapeutic.UA negative for infection - negative nitrites or leukocytes. CT abdomen and pelvis without contrast identified 3 ventral hernias more inferior right periumbilical hernia which appears to be partially obstructing with relative decompression of the distal small bowel some contrast passes through the herniated bowel. Fluid levels within the colon are nonspecific and may be  secondary to bowel obstruction. This provider spoke with GI who recommended CCS. This provider spoke with [facility] for admission regarding numerous comorbidities. NG tube to be placed in ED setting. General Surgery to see and assess patient. Patient to be admitted regarding ventral hernia and SBO. Patient stable, afebrile. Discussed plan for admission with patient - understood and agreed. Patient stable for transfer.   Jamse Mead, PA-C 08/17/13 [place], PA-C 08/17/13 1838

## 2013-08-17 NOTE — Progress Notes (Signed)
Utilization Review completed.  Adreonna Yontz RN CM  

## 2013-08-17 NOTE — ED Notes (Signed)
Light green and lavender redrawn and sent to lab.  

## 2013-08-17 NOTE — Consult Note (Signed)
[name] 03-29-1924  [id number].    Requesting MD: Dr. Tawnya Crook Chief Complaint/Reason for Consult: SBO  HPI:  80 y/o M with PMHx of Afib on coumadin, history of recurrent SBO with h/o SBR, ventral hernia s/p repair, hiatal hernia, tachycardia-bradycardia syndrome with PM placement in 2006/2014, DM, HTN, prostate cancer presenting to the ED with generalized abdominal pain, nausea, vomiting, diarrhea that started yesterday at approximately 12:00PM. Patient reported that the abdominal pain is mainly localized to just suprapubic and lower quadrants described as a dull pain.  Reported that he has been having feelings of nausea with at least 5-6 episodes of emesis yesterday - NB/NB. Reported that he has had numerous episodes of diarrhea yesterday and today - reported to be watery and non-bloody with mucus. Stated that he lives in a house by himself. Reported that he has been passing gas without difficulty. Reported that he has been having mild sweats.  Denied fever, chills, chest pain, shortness of breath, difficulty breathing, dizziness, falls, injuries.  His niece is at bedside attributes this illness to him eating "old food in his fridge".  The patient states his pain has significantly improved since being in the ER.  His niece notes his abdomen is not as distended and nausea and pain have improved.  He continues to have diarrhea and flatus.   ROS: All systems reviewed and otherwise negative except for as above  Family History  Problem Relation Age of Onset  . Pneumonia Father   . Cancer Brother     type unknown    Past Medical History  Diagnosis Date  . Tachycardia-bradycardia syndrome   . Atrial fibrillation   . Hypertension   . Diabetes mellitus   . Hx SBO     Last 2013 all treated conservatively  . Ventral hernia     x3  . Prostate cancer     Past Surgical History  Procedure Laterality Date  . Exploratory laparotomy    . Small intestine surgery  09/16/2004    SBO  resection, VH repair  . Hemorrhoid surgery    . Hiatal hernia repair  2002  . Pacemaker insertion    . Cholecystectomy open  03/31/2001    Dr Lindon Romp    Social History:  reports that he quit smoking about 58 years ago. He quit smokeless tobacco use about 61 years ago. His smokeless tobacco use included Snuff and Chew. He reports that he does not drink alcohol or use illicit drugs.  Allergies: No Known Allergies   (Not in a hospital admission)  Blood pressure 114/51, pulse 80, temperature 98.1 F (36.7 C), temperature source Oral, resp. rate 16, weight 160 lb (72.576 kg), SpO2 97.00%. Physical Exam: General: pleasant, WD/WN AA male who is laying in bed in NAD HEENT: head is normocephalic, atraumatic.  Sclera are noninjected.  PERRL.  Ears and nose without any masses or lesions.  Mouth is pink and moist.  No teeth present - wears dentures. Heart: regular, rate, and rhythm.  No obvious murmurs, gallops, or rubs noted.  Palpable pedal pulses bilaterally Lungs: CTAB, no wheezes, rhonchi, or rales noted.  Respiratory effort nonlabored Abd: soft, minimal tenderness, mild distension and tympanic, +BS, no masses, or organomegaly, multiple ventral hernia defects palpated in the mid abdomen which are easily reducible and soft MS: all 4 extremities are symmetrical with no cyanosis, clubbing, or edema. Skin: warm and dry with no masses, lesions, or rashes Psych: A&Ox3 with an appropriate affect.   Results for orders placed during  the hospital encounter of 08/17/13 (from the past 48 hour(s))  PROTIME-INR     Status: Abnormal   Collection Time    08/17/13  9:20 AM      Result Value Ref Range   Prothrombin Time 34.6 (*) 11.6 - 15.2 seconds   INR 3.61 (*) 0.00 - 1.49  APTT     Status: Abnormal   Collection Time    08/17/13  9:20 AM      Result Value Ref Range   aPTT 54 (*) 24 - 37 seconds   Comment:            IF BASELINE aPTT IS ELEVATED,     SUGGEST PATIENT RISK ASSESSMENT     BE USED TO  DETERMINE APPROPRIATE     ANTICOAGULANT THERAPY.  I-STAT CG4 LACTIC ACID, ED     Status: None   Collection Time    08/17/13  9:31 AM      Result Value Ref Range   Lactic Acid, Venous 0.64  0.5 - 2.2 mmol/L  URINALYSIS, ROUTINE W REFLEX MICROSCOPIC     Status: None   Collection Time    08/17/13  9:45 AM      Result Value Ref Range   Color, Urine YELLOW  YELLOW   APPearance CLEAR  CLEAR   Specific Gravity, Urine 1.016  1.005 - 1.030   pH 5.0  5.0 - 8.0   Glucose, UA NEGATIVE  NEGATIVE mg/dL   Hgb urine dipstick NEGATIVE  NEGATIVE   Bilirubin Urine NEGATIVE  NEGATIVE   Ketones, ur NEGATIVE  NEGATIVE mg/dL   Protein, ur NEGATIVE  NEGATIVE mg/dL   Urobilinogen, UA 0.2  0.0 - 1.0 mg/dL   Nitrite NEGATIVE  NEGATIVE   Leukocytes, UA NEGATIVE  NEGATIVE   Comment: MICROSCOPIC NOT DONE ON URINES WITH NEGATIVE PROTEIN, BLOOD, LEUKOCYTES, NITRITE, OR GLUCOSE <1000 mg/dL.  CBC WITH DIFFERENTIAL     Status: Abnormal   Collection Time    08/17/13 11:00 AM      Result Value Ref Range   WBC 10.2  4.0 - 10.5 K/uL   RBC 4.16 (*) 4.22 - 5.81 MIL/uL   Hemoglobin 10.7 (*) 13.0 - 17.0 g/dL   HCT 31.4 (*) 39.0 - 52.0 %   MCV 75.5 (*) 78.0 - 100.0 fL   MCH 25.7 (*) 26.0 - 34.0 pg   MCHC 34.1  30.0 - 36.0 g/dL   RDW 16.2 (*) 11.5 - 15.5 %   Platelets 121 (*) 150 - 400 K/uL   Neutrophils Relative % 92 (*) 43 - 77 %   Neutro Abs 9.4 (*) 1.7 - 7.7 K/uL   Lymphocytes Relative 5 (*) 12 - 46 %   Lymphs Abs 0.5 (*) 0.7 - 4.0 K/uL   Monocytes Relative 2 (*) 3 - 12 %   Monocytes Absolute 0.2  0.1 - 1.0 K/uL   Eosinophils Relative 0  0 - 5 %   Eosinophils Absolute 0.0  0.0 - 0.7 K/uL   Basophils Relative 0  0 - 1 %   Basophils Absolute 0.0  0.0 - 0.1 K/uL  COMPREHENSIVE METABOLIC PANEL     Status: Abnormal   Collection Time    08/17/13 11:00 AM      Result Value Ref Range   Sodium 142  137 - 147 mEq/L   Potassium 3.5 (*) 3.7 - 5.3 mEq/L   Comment: RESULTS VERIFIED VIA RECOLLECT   Chloride 110  96 -  112 mEq/L   CO2  21  19 - 32 mEq/L   Glucose, Bld 123 (*) 70 - 99 mg/dL   BUN 18  6 - 23 mg/dL   Creatinine, Ser 1.50 (*) 0.50 - 1.35 mg/dL   Calcium 8.1 (*) 8.4 - 10.5 mg/dL   Total Protein 6.1  6.0 - 8.3 g/dL   Albumin 3.2 (*) 3.5 - 5.2 g/dL   AST 19  0 - 37 U/L   ALT 6  0 - 53 U/L   Alkaline Phosphatase 76  39 - 117 U/L   Total Bilirubin 0.3  0.3 - 1.2 mg/dL   GFR calc non Af Amer 40 (*) >90 mL/min   GFR calc Af Amer 46 (*) >90 mL/min   Comment: (NOTE)     The eGFR has been calculated using the CKD EPI equation.     This calculation has not been validated in all clinical situations.     eGFR's persistently <90 mL/min signify possible Chronic Kidney     Disease.  LIPASE, BLOOD     Status: None   Collection Time    08/17/13 11:00 AM      Result Value Ref Range   Lipase 17  11 - 59 U/L  TROPONIN I     Status: None   Collection Time    08/17/13 11:00 AM      Result Value Ref Range   Troponin I <0.30  <0.30 ng/mL   Comment:            Due to the release kinetics of cTnI,     a negative result within the first hours     of the onset of symptoms does not rule out     myocardial infarction with certainty.     If myocardial infarction is still suspected,     repeat the test at appropriate intervals.  TROPONIN I     Status: None   Collection Time    08/17/13  3:31 PM      Result Value Ref Range   Troponin I <0.30  <0.30 ng/mL   Comment:            Due to the release kinetics of cTnI,     a negative result within the first hours     of the onset of symptoms does not rule out     myocardial infarction with certainty.     If myocardial infarction is still suspected,     repeat the test at appropriate intervals.   Ct Abdomen Pelvis Wo Contrast  08/17/2013   CLINICAL DATA:  Nausea, vomiting, diarrhea and weakness since yesterday. History of prostate cancer. Abdominal pain and emesis.  EXAM: CT ABDOMEN AND PELVIS WITHOUT CONTRAST  TECHNIQUE: Multidetector CT imaging of the abdomen  and pelvis was performed following the standard protocol without intravenous contrast.  COMPARISON:  DG ABD ACUTE W/CHEST dated 03/05/2013; CT ABD/PELVIS W CM dated 08/07/2012; DG ABD 1 VIEW dated 02/18/2012  FINDINGS: Lung Bases: Dependent atelectasis. Cardiomegaly with small amount of pericardial fluid or thickening. Pacemaker leads visualized.  Liver: Unenhanced CT was performed per clinician order. Lack of IV contrast limits sensitivity and specificity, especially for evaluation of abdominal/pelvic solid viscera. Grossly normal. No interval change compared to prior.  Spleen:  Normal.  Gallbladder:  Surgically absent.  Common bile duct:  Normal.  Pancreas:  Normal.  Adrenal glands:  Normal.  Kidneys: Nonspecific bilateral perinephric stranding. 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 The ureters appear within normal limits.  Stomach:  Moderate hiatal hernia. No inflammatory changes of stomach.  Small bowel: Duodenum appears within normal limits. There is progressive dilation of small bowel extending into the central abdomen. Multiple ventral hernias are present. In the upper abdomen, there is a dilated loop of small bowel within the hernia however there does not appear to be obstruction of this loop. The appearance of the abdominal wall suggests prior herniorrhaphy. There is an inferior right-sided periumbilical ventral hernia that does show obstruction although not complete. Small amount of contrast is present distally. Small bowel anastomotic staple line is present in the anatomic pelvis.  Colon: Normal appendix. Fluid is present within the colon which is nonspecific. There is a Forensic psychologist hernia involving proximal transverse colon (image 35 series 2) without obstruction. Fluid extends to the rectosigmoid.  Pelvic Genitourinary: Urinary bladder shows spinal lying compatible with TURP. Prostate brachytherapy seeds are present. No free fluid.  Bones: Osteopenia. No aggressive osseous lesions. Small stable  sclerotic focus in the medial left iliac bone probably represents a bone island.  Vasculature: Atherosclerosis. Stable dilation of the cisterna chyli at the level of the diaphragmatic hiatus.  Body Wall: Ventral hernias described above under the small bowel and colon sections.  IMPRESSION: 1. Three ventral hernias, the more inferior right periumbilical hernia which appears to be partially obstructing with relative decompression of distal small bowel. Some contrast does pass through the herniated bowel. Recommend assessment for reducible hernia. The appearance is very similar to the prior exam 08/07/2012. 2. Postoperative changes of bowel. 3. Expected evolution of right renal infarct. 4. Cholecystectomy. 5. Moderate hiatal hernia. 6. Fluid levels within the colon are nonspecific and may be secondary to bowel obstruction.   Electronically Signed   By: Dereck Ligas M.D.   On: 08/17/2013 14:37      Assessment/Plan SBO N/V/D 3 ventral hernias - chronic without acute worsening H/o SBR and ventral hernia repair in 2006 or earlier per niece H/o recurrent SBO - all of which have resolved conservatively with NG tube  Plan: 1.  Medicine asked to admit, do not believe he needs surgical intervention at this time for his hernias as the one in question on CT is soft and easily reducible.  He seems to have improved already since being in the ER per his niece who is at bedside.  Dr. Dalbert Batman to see and evaluate and give any further recommendations. 2.  NPO, bowel rest, NG tube, IVF, pain control, antiemetics 3.  SCD's  4.  Ambulate and IS 5.  Hopefully this will resolve conservatively without any surgical intervention.   Coralie Keens, [facility] 08/17/2013, 4:33 PM Pager: [phone]

## 2013-08-17 NOTE — Consult Note (Signed)
General surgery attending:  I have interviewed and examined this patient. I reviewed his old records, lab work from today and current imaging studies. I agree with the assessment and treatment plan outlined by Ms. Dort, PA.  He presents with what looks like a recurrent partial small bowel obstruction. He gives a 24-hour history of repeated vomiting and diarrhea, no blood seen from either end. CT scan looks similar to prior episodes. Some dilated small bowel. 3 small incisional hernias there did not appear to be obstructing.WBC 10,200, but with left shift. Potassium 3.5. Creatinine 1.5. Lipase 17. Lactic acidnormal.  Exam: Patient is alert. His niece is in the room with him. If he tube has been inserted. Minimal distress. Abdominal exam reveals right subcostal scar, a long midline scar, and multiple small to medium-size ventral hernias which are soft and easily reducible. No inguinal hernia.  Assessment: Recurrent partial small bowel obstruction, likely secondary to adhesions. The incisional hernias are easily reducible and are not the cause of obstruction and do not require any urgent surgical intervention. No evidence of compromised bowel  Multiple ventral incisional hernias, easily reducible.  Status post open cholecystectomy  Status post open hiatal hernia repair by Dr. Leafy Kindle Status post laparotomy with small bowel resection for incarcerated hernia Atrial fibrillation with history of bradycardia tachycardia syndrome, pacemaker inserted 2014 Hypertension Prostate cancer Diabetes.  Plan: Bowel rest and NG decompression. There is no acute surgical need. IV fluids, pain control, antiemetics SCDs. Pharmacologic DVT prophylaxis is okay with me Minimize narcotic Treatment dehydration and fluid and electrolyte abnormalities with IV fluids as you are doing Hopefully this will resolve conservatively without any surgical intervention.   Edsel Petrin. Dalbert Batman, M.D., [facility] General and Minimally invasive Surgery Breast and Colorectal Surgery Office:   [phone] Pager:   [phone]

## 2013-08-17 NOTE — Progress Notes (Signed)
ANTICOAGULATION CONSULT NOTE - Initial Consult  Pharmacy Consult for Warfarin Indication:   No Known Allergies  Patient Measurements: Height: 5\' 11"  (180.3 cm) Weight: 156 lb 4.9 oz (70.9 kg) IBW/kg (Calculated) : 75.3  Vital Signs: Temp: 98.8 F (37.1 C) (03/31 1906) Temp src: Oral (03/31 1906) BP: 124/72 mmHg (03/31 1906) Pulse Rate: 80 (03/31 1906)  Labs:  Recent Labs  08/17/13 0920 08/17/13 1100 08/17/13 1531  HGB  --  10.7*  --   HCT  --  31.4*  --   PLT  --  121*  --   APTT 54*  --   --   LABPROT 34.6*  --   --   INR 3.61*  --   --   CREATININE  --  1.50*  --   TROPONINI  --  <0.30 <0.30    Estimated Creatinine Clearance: 34.8 ml/min (by C-G formula based on Cr of 1.5).   Medical History: Past Medical History  Diagnosis Date  . Tachycardia-bradycardia syndrome   . Atrial fibrillation   . Hypertension   . Diabetes mellitus   . Hx SBO     Last 2013 all treated conservatively  . Ventral hernia     x3  . Prostate cancer    Assessment: 6 yoM on chronic coumadin for hx Afib s/p permanent pacemaker, also hx HTN, prostate cancer, recurrent SBO episodes. Presents with N/V, abdominal pain, CT revealing SBO with 3 ventral hernias. Pharmacy consulted to resume warfarin dosing inpatient. Surgery/GI consulted. Plan to continue NG suction, no need for urgent surgery at the moment.   Home dose Warfarin: 3mg  alt with 4mg  - had 4mg  3/30 INR on admission = 3.61 today CBC: Hgb 10.7, plts 121 - low  Goal of Therapy:  INR 2-3 Monitor platelets by anticoagulation protocol: Yes   Plan:  Hold warfarin tonight Daily PT/INR  Ralene Bathe, PharmD, BCPS 08/17/2013, 7:50 PM  Pager: [phone]

## 2013-08-17 NOTE — ED Notes (Signed)
Patient reports feeling much better.

## 2013-08-17 NOTE — ED Notes (Addendum)
PT is back from CT Scan

## 2013-08-17 NOTE — Progress Notes (Signed)
Shift event: RN paged this NP because 2 RNs had tried to place NGT without success. ED RN also tried without success. Patient can not tolerate any more attempts at this time. Pt just had a BM without diarrhea or blood. He has not had any vomiting. Given his intolerance and inability to place NGT along with no sx of vomiting at this time, will hold off on NGT at this time. This NP asked RN to call immediately should pt have any vomiting, melana, or hematemesis. This NP is confused as both the surgery and GI notes say pt is doing better with NG suction and RN tells me pt has never had NGT during this visit. Will continue to follow. Clance Boll, NP [facility]

## 2013-08-18 ENCOUNTER — Encounter (HOSPITAL_COMMUNITY): Payer: Self-pay | Admitting: *Deleted

## 2013-08-18 ENCOUNTER — Inpatient Hospital Stay (HOSPITAL_COMMUNITY): Payer: Medicare Other

## 2013-08-18 DIAGNOSIS — K565 Intestinal adhesions [bands], unspecified as to partial versus complete obstruction: Secondary | ICD-10-CM

## 2013-08-18 DIAGNOSIS — K432 Incisional hernia without obstruction or gangrene: Secondary | ICD-10-CM

## 2013-08-18 DIAGNOSIS — K56609 Unspecified intestinal obstruction, unspecified as to partial versus complete obstruction: Secondary | ICD-10-CM

## 2013-08-18 DIAGNOSIS — R112 Nausea with vomiting, unspecified: Secondary | ICD-10-CM

## 2013-08-18 LAB — COMPREHENSIVE METABOLIC PANEL
ALT: 6 U/L (ref 0–53)
AST: 21 U/L (ref 0–37)
Albumin: 3 g/dL — ABNORMAL LOW (ref 3.5–5.2)
Alkaline Phosphatase: 68 U/L (ref 39–117)
BUN: 21 mg/dL (ref 6–23)
CO2: 18 mEq/L — ABNORMAL LOW (ref 19–32)
CREATININE: 1.53 mg/dL — AB (ref 0.50–1.35)
Calcium: 8.2 mg/dL — ABNORMAL LOW (ref 8.4–10.5)
Chloride: 109 mEq/L (ref 96–112)
GFR calc Af Amer: 45 mL/min — ABNORMAL LOW (ref >90)
GFR calc non Af Amer: 39 mL/min — ABNORMAL LOW (ref >90)
Glucose, Bld: 85 mg/dL (ref 70–99)
Potassium: 3.7 mEq/L (ref 3.7–5.3)
Sodium: 140 mEq/L (ref 137–147)
Total Bilirubin: 0.3 mg/dL (ref 0.3–1.2)
Total Protein: 5.9 g/dL — ABNORMAL LOW (ref 6.0–8.3)

## 2013-08-18 LAB — CBC WITH DIFFERENTIAL/PLATELET
BASOS PCT: 0 % (ref 0–1)
Basophils Absolute: 0 10*3/uL (ref 0.0–0.1)
EOS ABS: 0.1 10*3/uL (ref 0.0–0.7)
Eosinophils Relative: 1 % (ref 0–5)
HCT: 32.3 % — ABNORMAL LOW (ref 39.0–52.0)
Hemoglobin: 10.4 g/dL — ABNORMAL LOW (ref 13.0–17.0)
Lymphocytes Relative: 14 % (ref 12–46)
Lymphs Abs: 0.9 10*3/uL (ref 0.7–4.0)
MCH: 24.5 pg — AB (ref 26.0–34.0)
MCHC: 32.2 g/dL (ref 30.0–36.0)
MCV: 76 fL — ABNORMAL LOW (ref 78.0–100.0)
MONO ABS: 0.5 10*3/uL (ref 0.1–1.0)
Monocytes Relative: 8 % (ref 3–12)
NEUTROS PCT: 78 % — AB (ref 43–77)
Neutro Abs: 5.4 10*3/uL (ref 1.7–7.7)
Platelets: 125 10*3/uL — ABNORMAL LOW (ref 150–400)
RBC: 4.25 MIL/uL (ref 4.22–5.81)
RDW: 16.4 % — ABNORMAL HIGH (ref 11.5–15.5)
WBC: 6.9 10*3/uL (ref 4.0–10.5)

## 2013-08-18 LAB — PROTIME-INR
INR: 1.26 (ref 0.00–1.49)
PROTHROMBIN TIME: 15.5 s — AB (ref 11.6–15.2)

## 2013-08-18 LAB — CBC
HCT: 33.5 % — ABNORMAL LOW (ref 39.0–52.0)
Hemoglobin: 10.9 g/dL — ABNORMAL LOW (ref 13.0–17.0)
MCH: 24.8 pg — ABNORMAL LOW (ref 26.0–34.0)
MCHC: 32.5 g/dL (ref 30.0–36.0)
MCV: 76.1 fL — ABNORMAL LOW (ref 78.0–100.0)
PLATELETS: 119 10*3/uL — AB (ref 150–400)
RBC: 4.4 MIL/uL (ref 4.22–5.81)
RDW: 16.5 % — ABNORMAL HIGH (ref 11.5–15.5)
WBC: 7.1 10*3/uL (ref 4.0–10.5)

## 2013-08-18 MED ORDER — WARFARIN SODIUM 5 MG PO TABS
5.0000 mg | ORAL_TABLET | Freq: Once | ORAL | Status: AC
  Administered 2013-08-18: 5 mg via ORAL
  Filled 2013-08-18: qty 1

## 2013-08-18 MED ORDER — POTASSIUM CHLORIDE 10 MEQ/100ML IV SOLN
10.0000 meq | INTRAVENOUS | Status: AC
  Administered 2013-08-18 (×4): 10 meq via INTRAVENOUS
  Filled 2013-08-18 (×4): qty 100

## 2013-08-18 NOTE — Progress Notes (Addendum)
[facility] PROGRESS NOTE  [name] HRC:[id number] DOB: April 28, 1924 DOA: 08/17/2013 PCP: Foye Spurling, MD  Brief narrative: 80 year old man with a history of atrial fibrillation on Coumadin status post permanent pacemaker, hypertension, prostate cancer,  has had extensive prior surgical history that includes cholecystectomy done openly, hiatal hernia repair, also exploratory laparotomy due to to adhesions who presented to [facility] ED 08/17/2013 with abdominal pain nausea and vomiting that began 1 day prior to this admission.Marland Kitchen He was found to have small bowel obstruction which is being managed conservatively at this time.  Assessment/Plan:  Principal Problem:   SBO (small bowel obstruction) - likely due ot extensive abdominal surgeries, adhesions - seems to be resolving based on abdominal x rays - diet will be advanced  Active Problems:   Atrial fibrillation - on coumadin - rate controlled - has pacemaker    Anemia of chronic disease - secondary to history of prostate ca - hemoglobin stable at 10.9   CKD stage 3 - in 2014 creatinine 1.6 - on this admission creatinine 1.5 - continue to monitor renal function   Code Status: full code Family Communication: no family at the bedside Disposition Plan: home when stable  Leisa Lenz, MD  [facility] Pager [phone]  If 7PM-7AM, please contact night-coverage [email or web address] Password [email or web address] 08/18/2013, 4:33 PM   LOS: 1 day   Consultants:  Surgery   Procedures:  None   Antibiotics:  None   HPI/Subjective: No acute overnight events.   Objective: Filed Vitals:   08/17/13 1906 08/18/13 0212 08/18/13 0611 08/18/13 1430  BP: 124/72 126/79 119/63 130/80  Pulse: 80 86 76 71  Temp: 98.8 F (37.1 C) 98.9 F (37.2 C) 98.4 F (36.9 C) 98.6 F (37 C)  TempSrc: Oral Oral Oral Oral  Resp: 18 16 16 18   Height: 5\' 11"  (1.803 m)     Weight: 70.9 kg (156 lb 4.9 oz)     SpO2: 98% 99% 99% 100%    Intake/Output  Summary (Last 24 hours) at 08/18/13 1633 Last data filed at 08/18/13 0815  Gross per 24 hour  Intake      0 ml  Output    400 ml  Net   -400 ml    Exam:   General:  Pt is alert, follows commands appropriately, not in acute distress  Cardiovascular: irregular rhythm, S1/S2 appreciated  Respiratory: Clear to auscultation bilaterally, no wheezing, no crackles, no rhonchi  Abdomen: Soft, non tender, non distended, bowel sounds present, no guarding  Extremities: No edema, pulses DP and PT palpable bilaterally  Neuro: Grossly nonfocal  Data Reviewed: Basic Metabolic Panel:  Recent Labs Lab 08/17/13 1100 08/17/13 1531 08/18/13 0433  NA 142  --  140  K 3.5*  --  3.7  CL 110  --  109  CO2 21  --  18*  GLUCOSE 123*  --  85  BUN 18  --  21  CREATININE 1.50*  --  1.53*  CALCIUM 8.1*  --  8.2*  MG  --  1.8  --    Liver Function Tests:  Recent Labs Lab 08/17/13 1100 08/18/13 0433  AST 19 21  ALT 6 6  ALKPHOS 76 68  BILITOT 0.3 0.3  PROT 6.1 5.9*  ALBUMIN 3.2* 3.0*    Recent Labs Lab 08/17/13 1100  LIPASE 17   No results found for this basename: AMMONIA,  in the last 168 hours CBC:  Recent Labs Lab 08/17/13 1100 08/18/13 0433 08/18/13 1115  WBC 10.2  6.9 7.1  NEUTROABS 9.4* 5.4  --   HGB 10.7* 10.4* 10.9*  HCT 31.4* 32.3* 33.5*  MCV 75.5* 76.0* 76.1*  PLT 121* 125* 119*   Cardiac Enzymes:  Recent Labs Lab 08/17/13 1100 08/17/13 1531  TROPONINI <0.30 <0.30   BNP: No components found with this basename: POCBNP,  CBG: No results found for this basename: GLUCAP,  in the last 168 hours  No results found for this or any previous visit (from the past 240 hour(s)).   Studies: Ct Abdomen Pelvis Wo Contrast 08/17/2013  IMPRESSION: 1. Three ventral hernias, the more inferior right periumbilical hernia which appears to be partially obstructing with relative decompression of distal small bowel. Some contrast does pass through the herniated bowel.  Recommend assessment for reducible hernia. The appearance is very similar to the prior exam 08/07/2012. 2. Postoperative changes of bowel. 3. Expected evolution of right renal infarct. 4. Cholecystectomy. 5. Moderate hiatal hernia. 6. Fluid levels within the colon are nonspecific and may be secondary to bowel obstruction.     Dg Abd 2 Views 08/18/2013     IMPRESSION: Stable bowel gas pattern. Interval passage of oral contrast to the rectosigmoid colon arguing against small bowel obstruction. No pneumoperitoneum identified.      Scheduled Meds: . warfarin  5 mg Oral ONCE-1800   Continuous Infusions: . sodium chloride 75 mL/hr at 08/17/13 2023

## 2013-08-18 NOTE — ED Provider Notes (Signed)
Medical screening examination/treatment/procedure(s) were conducted as a shared visit with non-physician practitioner(s) and myself.  I personally evaluated the patient during the encounter. Pt presents w/ ab pain, nausea, found to have pSBO.  On PE after symptomatic treatment, he is feeling somewhat improved.  He has soft, reducible ventral hernia. PT admitted to [facility], surgery will consult.   EKG Interpretation   Date/Time:  Tuesday August 17 2013 09:12:06 EDT Ventricular Rate:  79 PR Interval:  216 QRS Duration: 93 QT Interval:  377 QTC Calculation: 432 R Axis:   13 Text Interpretation:  A fib with Ventricular-paced complexes No further  rhythm analysis attempted due to paced rhythm Borderline prolonged PR  interval Anteroseptal infarct, old Repol abnrm, severe global ischemia  (LM/MVD) New TWI in I, II, V3, otherwise similar Confirmed by DOCHERTY   MD, MEGAN [id number]) on 08/17/2013 9:21:57 AM        Neta Ehlers, MD 08/18/13 [id number]

## 2013-08-18 NOTE — Progress Notes (Signed)
INITIAL NUTRITION ASSESSMENT  DOCUMENTATION CODES Per approved criteria  -Not Applicable   INTERVENTION: -Diet advancement per MD -Will continue to monitor -Recommend Ensure Complete po BID, each supplement provides 350 kcal and 13 grams of protein as warranted   NUTRITION DIAGNOSIS: Inadequate oral intake related to inability to eat as evidenced by NPO status.   Goal: Pt to meet >/= 90% of their estimated nutrition needs    Monitor:  Diet order, total protein/energy intake, labs, weights, GI profile  Reason for Assessment: MST  80 y.o. male  Admitting Dx: SBO (small bowel obstruction)  ASSESSMENT: Patient is a pleasant 80 year old man with a history of atrial fibrillation chronically anticoagulated on Coumadin status post permanent pacemaker, hypertension, prostate cancer. He presents today with nausea and vomiting that began last night, nonbloody, nonbilious as well as diffuse abdominal pain.  -Pt denied any significant changes in appetite -N/v episodes were sudden/acute and pt reported eating well prior to 1-2 days pta -Diet recall indicates pt consuming 2 meals/day. Breakfast consists of eggs/bacon/toast, and a larger meal in the afternoon of vegetables and typically fish for protein -Pt eager for diet advancement. Reported to have had a BM today, no nausea -Surgery noted possible clear liquid diet advancement pending improvement in abd x-rays. MD noted sbo was unlikely r/t hernias -Was interested in learning about weight gaining. Denied significant weight loss; however previous medical records show pt may have lost 7 lbs in past month and a half.  -Will monitor PO intake once diet advances and recommend Ensure Complete if PO intake <75%  Height: Ht Readings from Last 1 Encounters:  08/17/13 5\' 11"  (1.803 m)    Weight: Wt Readings from Last 1 Encounters:  08/17/13 156 lb 4.9 oz (70.9 kg)    Ideal Body Weight: 172 lbs  % Ideal Body Weight: 91%  Wt Readings from  Last 10 Encounters:  08/17/13 156 lb 4.9 oz (70.9 kg)  07/08/13 163 lb (73.936 kg)  08/07/12 165 lb (74.844 kg)  07/07/12 168 lb (76.204 kg)  04/06/12 165 lb (74.844 kg)  04/06/12 165 lb (74.844 kg)  03/03/12 166 lb (75.297 kg)  02/15/12 162 lb 0.6 oz (73.5 kg)  04/18/11 173 lb 6.4 oz (78.654 kg)  03/13/10 168 lb (76.204 kg)    Usual Body Weight: approximately 165 lbs per previous medical records  % Usual Body Weight: 95%  BMI:  Body mass index is 21.81 kg/(m^2).  Estimated Nutritional Needs: Kcal: 1610-9604 Protein: 70-85 gram Fluid: >/=1800 ml/daily  Skin: WDL  Diet Order: NPO  EDUCATION NEEDS: -No education needs identified at this time   Intake/Output Summary (Last 24 hours) at 08/18/13 1145 Last data filed at 08/18/13 0815  Gross per 24 hour  Intake      0 ml  Output    400 ml  Net   -400 ml    Last BM: 4/1-per pt   Labs:   Recent Labs Lab 08/17/13 1100 08/17/13 1531 08/18/13 0433  NA 142  --  140  K 3.5*  --  3.7  CL 110  --  109  CO2 21  --  18*  BUN 18  --  21  CREATININE 1.50*  --  1.53*  CALCIUM 8.1*  --  8.2*  MG  --  1.8  --   GLUCOSE 123*  --  85    CBG (last 3)  No results found for this basename: GLUCAP,  in the last 72 hours  Scheduled Meds: . warfarin  5 mg Oral ONCE-1800  . Warfarin - Pharmacist Dosing Inpatient   Does not apply q1800    Continuous Infusions: . sodium chloride 75 mL/hr at 08/17/13 2023    Past Medical History  Diagnosis Date  . Tachycardia-bradycardia syndrome   . Atrial fibrillation   . Hypertension   . Diabetes mellitus   . Hx SBO     Last 2013 all treated conservatively  . Ventral hernia     x3  . Prostate cancer     Past Surgical History  Procedure Laterality Date  . Exploratory laparotomy    . Small intestine surgery  09/16/2004    SBO resection, VH repair  . Hemorrhoid surgery    . Hiatal hernia repair  2002  . Pacemaker insertion    . Cholecystectomy open  03/31/2001    Dr Luretha Murphy MS RD LDN Clinical Dietitian [phone]

## 2013-08-18 NOTE — Progress Notes (Signed)
Pt arrived to the floor from ED without NG tube, two nurses  attempted  to insert NG tube , unable to advance any further.pt refused the third attempt. K.Kirby the NP  Notified. Pt is still NPO , no c/o nausea, had a bowel movement.

## 2013-08-18 NOTE — Progress Notes (Signed)
Subjective: NG became coiled in his mouth and had to be removed. Attempts to reinsert NG tube have been unsuccessful and ultimately patient refused to have it put back in.  He is awake and alert and has no complaints. Afebrile. Vital signs stable.He has no nausea and states that he is hungry. He states he had a bowel movement.  WBC 6900. Hemoglobin 10.7. Creatinine 1.53. BUN 21. Potassium 3.7. Glucose 85.    Objective: Vital signs in last 24 hours: Temp:  [97.7 F (36.5 C)-98.9 F (37.2 C)] 98.4 F (36.9 C) (04/01 0611) Pulse Rate:  [53-106] 76 (04/01 0611) Resp:  [11-20] 16 (04/01 0611) BP: (105-126)/(51-79) 119/63 mmHg (04/01 0611) SpO2:  [96 %-99 %] 99 % (04/01 0611) Weight:  [156 lb 4.9 oz (70.9 kg)-160 lb (72.576 kg)] 156 lb 4.9 oz (70.9 kg) (03/31 1906)    Intake/Output from previous day: 03/31 0701 - 04/01 0700 In: -  Out: 200 [Urine:200] Intake/Output this shift: Total I/O In: -  Out: 200 [Urine:200]  General appearance: alert. Cooperative. Mental status seems reasonably normal. Pictures questions appropriately. In no distress. GI: abdomen is soft and completely nontender. Normoactive bowel sounds. Not sure if he is distended over baseline or not. Nontympanitic.Soft incisional hernias easily reducible.  No tenderness.  Lab Results:   Recent Labs  08/17/13 1100 08/18/13 0433  WBC 10.2 6.9  HGB 10.7* 10.4*  HCT 31.4* 32.3*  PLT 121* 125*   BMET  Recent Labs  08/17/13 1100 08/18/13 0433  NA 142 140  K 3.5* 3.7  CL 110 109  CO2 21 18*  GLUCOSE 123* 85  BUN 18 21  CREATININE 1.50* 1.53*  CALCIUM 8.1* 8.2*   PT/INR  Recent Labs  08/17/13 0920 08/18/13 0433  LABPROT 34.6* 15.5*  INR 3.61* 1.26   ABG No results found for this basename: PHART, PCO2, PO2, HCO3,  in the last 72 hours  Studies/Results: Ct Abdomen Pelvis Wo Contrast  08/17/2013   CLINICAL DATA:  Nausea, vomiting, diarrhea and weakness since yesterday. History of prostate  cancer. Abdominal pain and emesis.  EXAM: CT ABDOMEN AND PELVIS WITHOUT CONTRAST  TECHNIQUE: Multidetector CT imaging of the abdomen and pelvis was performed following the standard protocol without intravenous contrast.  COMPARISON:  DG ABD ACUTE W/CHEST dated 03/05/2013; CT ABD/PELVIS W CM dated 08/07/2012; DG ABD 1 VIEW dated 02/18/2012  FINDINGS: Lung Bases: Dependent atelectasis. Cardiomegaly with small amount of pericardial fluid or thickening. Pacemaker leads visualized.  Liver: Unenhanced CT was performed per clinician order. Lack of IV contrast limits sensitivity and specificity, especially for evaluation of abdominal/pelvic solid viscera. Grossly normal. No interval change compared to prior.  Spleen:  Normal.  Gallbladder:  Surgically absent.  Common bile duct:  Normal.  Pancreas:  Normal.  Adrenal glands:  Normal.  Kidneys: Nonspecific bilateral perinephric stranding. 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 The ureters appear within normal limits.  Stomach: Moderate hiatal hernia. No inflammatory changes of stomach.  Small bowel: Duodenum appears within normal limits. There is progressive dilation of small bowel extending into the central abdomen. Multiple ventral hernias are present. In the upper abdomen, there is a dilated loop of small bowel within the hernia however there does not appear to be obstruction of this loop. The appearance of the abdominal wall suggests prior herniorrhaphy. There is an inferior right-sided periumbilical ventral hernia that does show obstruction although not complete. Small amount of contrast is present distally. Small bowel anastomotic staple line is present in the anatomic  pelvis.  Colon: Normal appendix. Fluid is present within the colon which is nonspecific. There is a Forensic psychologist hernia involving proximal transverse colon (image 35 series 2) without obstruction. Fluid extends to the rectosigmoid.  Pelvic Genitourinary: Urinary bladder shows spinal lying compatible  with TURP. Prostate brachytherapy seeds are present. No free fluid.  Bones: Osteopenia. No aggressive osseous lesions. Small stable sclerotic focus in the medial left iliac bone probably represents a bone island.  Vasculature: Atherosclerosis. Stable dilation of the cisterna chyli at the level of the diaphragmatic hiatus.  Body Wall: Ventral hernias described above under the small bowel and colon sections.  IMPRESSION: 1. Three ventral hernias, the more inferior right periumbilical hernia which appears to be partially obstructing with relative decompression of distal small bowel. Some contrast does pass through the herniated bowel. Recommend assessment for reducible hernia. The appearance is very similar to the prior exam 08/07/2012. 2. Postoperative changes of bowel. 3. Expected evolution of right renal infarct. 4. Cholecystectomy. 5. Moderate hiatal hernia. 6. Fluid levels within the colon are nonspecific and may be secondary to bowel obstruction.   Electronically Signed   By: Dereck Ligas M.D.   On: 08/17/2013 14:37    Anti-infectives: Anti-infectives   None      Assessment/Plan:  Recurrent partial small bowel obstruction, likely secondary to adhesions. The incisional hernias are easily reducible and are not the cause of obstruction and do not require any urgent surgical intervention. No evidence of compromised bowel  It appears that his obstructive symptoms have greatly improved overnight. Hopefully this will resolve without surgical intervention NPO Check abdominal x-rays this morning. Clear liquid diet if x-rays show significant improvement.  Will give IV KCl to try to get potassium level up to approximately 4 to promote gut motility.  Multiple ventral incisional hernias, easily reducible.  Status post open cholecystectomy  Status post open hiatal hernia repair by Dr. Leafy Kindle  Status post laparotomy with small bowel resection for incarcerated hernia  Atrial fibrillation with history of  bradycardia tachycardia syndrome, pacemaker inserted 2014  Hypertension  Prostate cancer  Diabetes.    LOS: 1 day    [name] M 08/18/2013

## 2013-08-18 NOTE — Progress Notes (Signed)
ANTICOAGULATION CONSULT NOTE - Follow Up  Pharmacy Consult for Warfarin Indication:   No Known Allergies  Patient Measurements: Height: 5\' 11"  (180.3 cm) Weight: 156 lb 4.9 oz (70.9 kg) IBW/kg (Calculated) : 75.3  Vital Signs: Temp: 98.4 F (36.9 C) (04/01 0611) Temp src: Oral (04/01 0611) BP: 119/63 mmHg (04/01 0611) Pulse Rate: 76 (04/01 0611)  Labs:  Recent Labs  08/17/13 0920 08/17/13 1100 08/17/13 1531 08/18/13 0433  HGB  --  10.7*  --  10.4*  HCT  --  31.4*  --  32.3*  PLT  --  121*  --  125*  APTT 54*  --   --   --   LABPROT 34.6*  --   --  15.5*  INR 3.61*  --   --  1.26  CREATININE  --  1.50*  --  1.53*  TROPONINI  --  <0.30 <0.30  --     Estimated Creatinine Clearance: 34.1 ml/min (by C-G formula based on Cr of 1.53).   Medical History: Past Medical History  Diagnosis Date  . Tachycardia-bradycardia syndrome   . Atrial fibrillation   . Hypertension   . Diabetes mellitus   . Hx SBO     Last 2013 all treated conservatively  . Ventral hernia     x3  . Prostate cancer    Assessment: 45 yoM on chronic coumadin for hx Afib s/p permanent pacemaker, also hx HTN, prostate cancer, recurrent SBO episodes. Presents with N/V, abdominal pain, CT revealing SBO with 3 ventral hernias. Pharmacy consulted to resume warfarin dosing inpatient. Surgery/GI consulted. Plan to continue NG suction, no need for urgent surgery at the moment.   Home dose Warfarin: 3mg  alt with 4mg  - had 4mg  3/30  INR on admission = 3.61 3/31  CBC stable and plts low but stable  No reported bleeding  INR now subtherapeutic after being supratherapeutic yesterday  Improvement in SBO per notes - no surgery indicated yet but continue to follow  Goal of Therapy:  INR 2-3 Monitor platelets by anticoagulation protocol: Yes   Plan:  1) Warfarin 5mg  today 2) Daily INR  Adrian Saran, PharmD, BCPS Pager [phone] 08/18/2013 10:19 AM

## 2013-08-19 ENCOUNTER — Inpatient Hospital Stay (HOSPITAL_COMMUNITY): Payer: Medicare Other

## 2013-08-19 LAB — BASIC METABOLIC PANEL WITH GFR
BUN: 24 mg/dL — ABNORMAL HIGH (ref 6–23)
CO2: 18 meq/L — ABNORMAL LOW (ref 19–32)
Calcium: 8.4 mg/dL (ref 8.4–10.5)
Chloride: 112 meq/L (ref 96–112)
Creatinine, Ser: 1.55 mg/dL — ABNORMAL HIGH (ref 0.50–1.35)
GFR calc Af Amer: 45 mL/min — ABNORMAL LOW
GFR calc non Af Amer: 39 mL/min — ABNORMAL LOW
Glucose, Bld: 81 mg/dL (ref 70–99)
Potassium: 4.4 meq/L (ref 3.7–5.3)
Sodium: 140 meq/L (ref 137–147)

## 2013-08-19 LAB — PROTIME-INR
INR: 1.4 (ref 0.00–1.49)
Prothrombin Time: 16.8 seconds — ABNORMAL HIGH (ref 11.6–15.2)

## 2013-08-19 MED ORDER — ONDANSETRON HCL 4 MG PO TABS: 4.0000 mg | ORAL_TABLET | Freq: Four times a day (QID) | ORAL | Status: DC | PRN

## 2013-08-19 NOTE — Discharge Instructions (Signed)
Small Bowel Obstruction A small bowel obstruction is a blockage (obstruction) of the small intestine (small bowel). The small bowel is a long, slender tube that connects the stomach to the colon. Its job is to absorb nutrients from the fluids and foods you consume into the bloodstream.  CAUSES  There are many causes of intestinal blockage. The most common ones include:  Hernias. This is a more common cause in children than adults.  Inflammatory bowel disease (enteritis and colitis).  Twisting of the bowel (volvulus).  Tumors.  Scar tissue (adhesions) from previous surgery or radiation treatment.  Recent surgery. This may cause an acute small bowel obstruction called an ileus. SYMPTOMS   Abdominal pain. This may be dull cramps or sharp pain. It may occur in one area or may be present in the entire abdomen. Pain can range from mild to severe, depending on the degree of obstruction.  Nausea and vomiting. Vomit may be greenish or yellow bile color.  Distended or swollen stomach. Abdominal bloating is a common symptom.  Constipation.  Lack of passing gas.  Frequent belching.  Diarrhea. This may occur if runny stool is able to leak around the obstruction. DIAGNOSIS  Your caregiver can usually diagnose small bowel obstruction by taking a history, doing a physical exam, and taking X-rays. If the cause is unclear, a CT scan (computerized tomography) of your abdomen and pelvis may be needed. TREATMENT  Treatment of the blockage depends on the cause and how bad the problem is.   Sometimes, the obstruction improves with bed rest and intravenous (IV) fluids.  Resting the bowel is very important. This means following a simple diet. Sometimes, a clear liquid diet may be required for several days.  Sometimes, a small tube (nasogastric tube) is placed into the stomach to decompress the bowel. When the bowel is blocked, it usually swells up like a balloon filled with air and fluids.  Decompression means that the air and fluids are removed by suction through that tube. This can help with pain, discomfort, and nausea. It can also help the obstruction resolve faster.  Surgery may be required if other treatments do not work. Bowel obstruction from a hernia may require early surgery and can be an emergency procedure. Adhesions that cause frequent or severe obstructions may also require surgery. HOME CARE INSTRUCTIONS If your bowel obstruction is only partial or incomplete, you may be allowed to go home.  Get plenty of rest.  Follow your diet as directed by your caregiver.  Only consume clear liquids until your condition improves.  Avoid solid foods as instructed. SEEK IMMEDIATE MEDICAL CARE IF:  You have increased pain or cramping.  You vomit blood.  You have uncontrolled vomiting or nausea.  You cannot drink fluids due to vomiting or pain.  You develop confusion.  You begin feeling very dry or thirsty (dehydrated).  You have severe bloating.  You have chills.  You have a fever.  You feel extremely weak or you faint. MAKE SURE YOU:  Understand these instructions.  Will watch your condition.  Will get help right away if you are not doing well or get worse. Document Released: 07/23/2005 Document Revised: 07/29/2011 Document Reviewed: 07/20/2010 [employer] Patient Information 2014 [employer].  

## 2013-08-19 NOTE — Progress Notes (Signed)
General surgery attending:  Agree with assessment and treatment plan summarized by Ms. Dort, PA. Please see my note from 11 AM as well.  Edsel Petrin. Dalbert Batman, M.D., [facility] General and Minimally invasive Surgery Breast and Colorectal Surgery Office:   [phone] Pager:   [phone]

## 2013-08-19 NOTE — Progress Notes (Signed)
General Surgery:  Bowel obstruction has resolved clinically and radiographically. Agree with discharge. Will sign off  Ajani Schnieders M. Dalbert Batman, M.D., [facility] General and Minimally invasive Surgery Breast and Colorectal Surgery Office:   [phone] Pager:   [phone]

## 2013-08-19 NOTE — Progress Notes (Signed)
Subjective: Pt feels great.  No abdominal pain, No N/V.  Having BM's.  Wants to go home.  Objective: Vital signs in last 24 hours: Temp:  [98.2 F (36.8 C)-99.1 F (37.3 C)] 98.2 F (36.8 C) (04/02 0530) Pulse Rate:  [71-93] 83 (04/02 0530) Resp:  [18-20] 20 (04/02 0530) BP: (130-144)/(73-80) 133/73 mmHg (04/02 0530) SpO2:  [98 %-100 %] 98 % (04/02 0530) Last BM Date: 08/18/13  Intake/Output from previous day: 04/01 0701 - 04/02 0700 In: 3211.3 [P.O.:840; I.V.:2371.3] Out: 575 [Urine:575] Intake/Output this shift:    PE: Gen:  Alert, NAD, pleasant Abd: Soft, NT/ND, +BS, no HSM,  abdominal scars noted   Lab Results:   Recent Labs  08/18/13 0433 08/18/13 1115  WBC 6.9 7.1  HGB 10.4* 10.9*  HCT 32.3* 33.5*  PLT 125* 119*   BMET  Recent Labs  08/18/13 0433 08/19/13 0358  NA 140 140  K 3.7 4.4  CL 109 112  CO2 18* 18*  GLUCOSE 85 81  BUN 21 24*  CREATININE 1.53* 1.55*  CALCIUM 8.2* 8.4   PT/INR  Recent Labs  08/18/13 0433 08/19/13 0358  LABPROT 15.5* 16.8*  INR 1.26 1.40   CMP     Component Value Date/Time   NA 140 08/19/2013 0358   K 4.4 08/19/2013 0358   CL 112 08/19/2013 0358   CO2 18* 08/19/2013 0358   GLUCOSE 81 08/19/2013 0358   BUN 24* 08/19/2013 0358   CREATININE 1.55* 08/19/2013 0358   CALCIUM 8.4 08/19/2013 0358   PROT 5.9* 08/18/2013 0433   ALBUMIN 3.0* 08/18/2013 0433   AST 21 08/18/2013 0433   ALT 6 08/18/2013 0433   ALKPHOS 68 08/18/2013 0433   BILITOT 0.3 08/18/2013 0433   GFRNONAA 39* 08/19/2013 0358   GFRAA 45* 08/19/2013 0358   Lipase     Component Value Date/Time   LIPASE 17 08/17/2013 1100       Studies/Results: Ct Abdomen Pelvis Wo Contrast  08/17/2013   CLINICAL DATA:  Nausea, vomiting, diarrhea and weakness since yesterday. History of prostate cancer. Abdominal pain and emesis.  EXAM: CT ABDOMEN AND PELVIS WITHOUT CONTRAST  TECHNIQUE: Multidetector CT imaging of the abdomen and pelvis was performed following the standard protocol  without intravenous contrast.  COMPARISON:  DG ABD ACUTE W/CHEST dated 03/05/2013; CT ABD/PELVIS W CM dated 08/07/2012; DG ABD 1 VIEW dated 02/18/2012  FINDINGS: Lung Bases: Dependent atelectasis. Cardiomegaly with small amount of pericardial fluid or thickening. Pacemaker leads visualized.  Liver: Unenhanced CT was performed per clinician order. Lack of IV contrast limits sensitivity and specificity, especially for evaluation of abdominal/pelvic solid viscera. Grossly normal. No interval change compared to prior.  Spleen:  Normal.  Gallbladder:  Surgically absent.  Common bile duct:  Normal.  Pancreas:  Normal.  Adrenal glands:  Normal.  Kidneys: Nonspecific bilateral perinephric stranding. 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 The ureters appear within normal limits.  Stomach: Moderate hiatal hernia. No inflammatory changes of stomach.  Small bowel: Duodenum appears within normal limits. There is progressive dilation of small bowel extending into the central abdomen. Multiple ventral hernias are present. In the upper abdomen, there is a dilated loop of small bowel within the hernia however there does not appear to be obstruction of this loop. The appearance of the abdominal wall suggests prior herniorrhaphy. There is an inferior right-sided periumbilical ventral hernia that does show obstruction although not complete. Small amount of contrast is present distally. Small bowel anastomotic staple line is present  in the anatomic pelvis.  Colon: Normal appendix. Fluid is present within the colon which is nonspecific. There is a Forensic psychologist hernia involving proximal transverse colon (image 35 series 2) without obstruction. Fluid extends to the rectosigmoid.  Pelvic Genitourinary: Urinary bladder shows spinal lying compatible with TURP. Prostate brachytherapy seeds are present. No free fluid.  Bones: Osteopenia. No aggressive osseous lesions. Small stable sclerotic focus in the medial left iliac bone probably  represents a bone island.  Vasculature: Atherosclerosis. Stable dilation of the cisterna chyli at the level of the diaphragmatic hiatus.  Body Wall: Ventral hernias described above under the small bowel and colon sections.  IMPRESSION: 1. Three ventral hernias, the more inferior right periumbilical hernia which appears to be partially obstructing with relative decompression of distal small bowel. Some contrast does pass through the herniated bowel. Recommend assessment for reducible hernia. The appearance is very similar to the prior exam 08/07/2012. 2. Postoperative changes of bowel. 3. Expected evolution of right renal infarct. 4. Cholecystectomy. 5. Moderate hiatal hernia. 6. Fluid levels within the colon are nonspecific and may be secondary to bowel obstruction.   Electronically Signed   By: Dereck Ligas M.D.   On: 08/17/2013 14:37   Dg Abd 2 Views  08/18/2013   CLINICAL DATA:  80 year old male with abdominal pain. Small bowel obstruction. Initial encounter.  EXAM: ABDOMEN - 2 VIEW  COMPARISON:  CT Abdomen and Pelvis 08/17/2013.  FINDINGS: Supine and left-side-down lateral decubitus views of the abdomen at 1104 hrs. Interval passage of oral contrast to the rectosigmoid colon. Overall stable bowel gas pattern. Stable right upper quadrant and lower pelvic surgical clips. No pneumoperitoneum identified. Cardiac pacemaker lead. Stable visualized osseous structures.  IMPRESSION: Stable bowel gas pattern. Interval passage of oral contrast to the rectosigmoid colon arguing against small bowel obstruction. No pneumoperitoneum identified.   Electronically Signed   By: Lars Pinks M.D.   On: 08/18/2013 12:20    Anti-infectives: Anti-infectives   None       Assessment/Plan Recurrent partial small bowel obstruction, likely secondary to adhesions. The incisional hernias are easily reducible and are not the cause of obstruction and do not require any urgent surgical intervention.   Multiple ventral incisional  hernias, easily reducible.  Status post open cholecystectomy  Status post open hiatal hernia repair by Dr. Leafy Kindle  Status post laparotomy with small bowel resection for incarcerated hernia  Atrial fibrillation with history of bradycardia tachycardia syndrome, pacemaker inserted 2014  Hypertension  Prostate cancer  Diabetes.  Plan: 1.  Clinically and radiographically improved.  Okay from our perspective to discharge home 2.  Recommend good bowel regimen to prevent constipation 3.  No specific follow up needed, will sign off.   LOS: 2 days    Coralie Keens 08/19/2013, 8:03 AM Pager: [phone]

## 2013-08-19 NOTE — Discharge Summary (Signed)
Physician Discharge Summary  [name] [id number] DOB: 14-Nov-1923 DOA: 08/17/2013  PCP: Foye Spurling, MD  Admit date: 08/17/2013 Discharge date: 08/19/2013  Recommendations for Outpatient Follow-up:  1. Please continue same coumadin dose per home regimen. 2. Follow up with PCP Monday 08/23/2013 to have your INR rechecked. Today 4/2 INR is 1.4. You have received coumadin 5 mg on 4/1. 3. Stop Hyzaar for now until seen by PCP. It may have caused your kidney function to be slightly above normal limit. Creatinine is 1.55 this morning, 4/2.  Discharge Diagnoses:  Principal Problem:   SBO (small bowel obstruction) Active Problems:   Atrial fibrillation   PPM-Medtronic   Ventral hernia with obstruction   Hypokalemia    Discharge Condition: medically stable for discharge home today   Diet recommendation: as tolerated   History of present illness:  80 year old man with a history of atrial fibrillation on Coumadin status post permanent pacemaker, hypertension, prostate cancer, has had extensive prior surgical history that includes cholecystectomy done openly, hiatal hernia repair, also exploratory laparotomy due to to adhesions who presented to [facility] ED 08/17/2013 with abdominal pain nausea and vomiting that began 1 day prior to this admission.Marland Kitchen He was found to have small bowel obstruction which is being managed conservatively at this time.   Assessment/Plan:   Principal Problem:  SBO (small bowel obstruction)  - likely due ot extensive abdominal surgeries, adhesions  - resolving based on abdominal x rays  - diet advanced to regular  Active Problems:  Atrial fibrillation  - on coumadin and can resume per home regimen on discharge  - rate controlled  - has pacemaker  Anemia of chronic disease  - secondary to history of prostate ca  - hemoglobin stable at 10.9  CKD stage 3  - in 2014 creatinine 1.6  - on this admission creatinine 1.5  - continue to monitor renal function  -  hold hyzaar until seen by PCP and they can recheck kidney function to make sure it is stable.  Code Status: full code  Family Communication: no family at the bedside    Consultants:  Surgery  (Dr. Renelda Loma, [facility]) Procedures:  None  Antibiotics:  None  Signed:  Leisa Lenz, MD  [facility] 08/19/2013, 10:20 AM  Pager #: [phone]   Discharge Exam: Filed Vitals:   08/19/13 0530  BP: 133/73  Pulse: 83  Temp: 98.2 F (36.8 C)  Resp: 20   Filed Vitals:   08/18/13 0611 08/18/13 1430 08/18/13 1948 08/19/13 0530  BP: 119/63 130/80 144/77 133/73  Pulse: 76 71 93 83  Temp: 98.4 F (36.9 C) 98.6 F (37 C) 99.1 F (37.3 C) 98.2 F (36.8 C)  TempSrc: Oral Oral Oral Oral  Resp: 16 18 18 20   Height:      Weight:      SpO2: 99% 100% 99% 98%    General: Pt is alert, follows commands appropriately, not in acute distress Cardiovascular: Regular rate and rhythm, S1/S2 appreciated  Respiratory: Clear to auscultation bilaterally, no wheezing, no crackles, no rhonchi Abdominal: Soft, non tender, non distended, bowel sounds +, no guarding Extremities: no edema, no cyanosis, pulses palpable bilaterally DP and PT Neuro: Grossly nonfocal  Discharge Instructions  Discharge Orders   Future Appointments Provider Department Dept Phone   10/12/2013 9:05 AM [facility] [place] Office [phone]   Future Orders Complete By Expires   Call MD for:  difficulty breathing, headache or visual disturbances  As directed  Call MD for:  persistant dizziness or light-headedness  As directed    Call MD for:  persistant nausea and vomiting  As directed    Call MD for:  severe uncontrolled pain  As directed    Diet - low sodium heart healthy  As directed    Discharge instructions  As directed    Comments:     1. Please continue same coumadin dose per home regimen. 2. Follow up with PCP Monday 08/23/2013 to have your INR rechecked. Today 4/2 INR is 1.4.  You have received coumadin 5 mg on 4/1. 3. Stop Hyzaar for now until seen by PCP. It may have caused your kidney function to be slightly above normal limit. Creatinine is 1.55 this morning, 4/2.   Increase activity slowly  As directed        Medication List    STOP taking these medications       losartan-hydrochlorothiazide 100-12.5 MG per tablet  Commonly known as:  HYZAAR      TAKE these medications       diltiazem 240 MG 24 hr tablet  Commonly known as:  CARDIZEM LA  Take 240 mg by mouth at bedtime.     metoprolol tartrate 25 MG tablet  Commonly known as:  LOPRESSOR  Take 25 mg by mouth 2 (two) times daily.     ondansetron 4 MG tablet  Commonly known as:  ZOFRAN  Take 1 tablet (4 mg total) by mouth every 6 (six) hours as needed for nausea.     pseudoephedrine-acetaminophen 30-500 MG Tabs  Commonly known as:  TYLENOL SINUS  Take 1 tablet by mouth every 4 (four) hours as needed (congestion/headache).     warfarin 4 MG tablet  Commonly known as:  COUMADIN  Take 4 mg by mouth every other day.     warfarin 3 MG tablet  Commonly known as:  COUMADIN  Take 3 mg by mouth every other day.           Follow-up Information   Follow up with Foye Spurling, MD.   Specialty:  Internal Medicine   Contact information:   [address] [place]  [id number] [phone]        The results of significant diagnostics from this hospitalization (including imaging, microbiology, ancillary and laboratory) are listed below for reference.    Significant Diagnostic Studies: Ct Abdomen Pelvis Wo Contrast  08/17/2013   CLINICAL DATA:  Nausea, vomiting, diarrhea and weakness since yesterday. History of prostate cancer. Abdominal pain and emesis.  EXAM: CT ABDOMEN AND PELVIS WITHOUT CONTRAST  TECHNIQUE: Multidetector CT imaging of the abdomen and pelvis was performed following the standard protocol without intravenous contrast.  COMPARISON:  DG ABD ACUTE W/CHEST dated  03/05/2013; CT ABD/PELVIS W CM dated 08/07/2012; DG ABD 1 VIEW dated 02/18/2012  FINDINGS: Lung Bases: Dependent atelectasis. Cardiomegaly with small amount of pericardial fluid or thickening. Pacemaker leads visualized.  Liver: Unenhanced CT was performed per clinician order. Lack of IV contrast limits sensitivity and specificity, especially for evaluation of abdominal/pelvic solid viscera. Grossly normal. No interval change compared to prior.  Spleen:  Normal.  Gallbladder:  Surgically absent.  Common bile duct:  Normal.  Pancreas:  Normal.  Adrenal glands:  Normal.  Kidneys: Nonspecific bilateral perinephric stranding. 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 The ureters appear within normal limits.  Stomach: Moderate hiatal hernia. No inflammatory changes of stomach.  Small bowel: Duodenum appears within normal limits. There is progressive dilation of  small bowel extending into the central abdomen. Multiple ventral hernias are present. In the upper abdomen, there is a dilated loop of small bowel within the hernia however there does not appear to be obstruction of this loop. The appearance of the abdominal wall suggests prior herniorrhaphy. There is an inferior right-sided periumbilical ventral hernia that does show obstruction although not complete. Small amount of contrast is present distally. Small bowel anastomotic staple line is present in the anatomic pelvis.  Colon: Normal appendix. Fluid is present within the colon which is nonspecific. There is a Forensic psychologist hernia involving proximal transverse colon (image 35 series 2) without obstruction. Fluid extends to the rectosigmoid.  Pelvic Genitourinary: Urinary bladder shows spinal lying compatible with TURP. Prostate brachytherapy seeds are present. No free fluid.  Bones: Osteopenia. No aggressive osseous lesions. Small stable sclerotic focus in the medial left iliac bone probably represents a bone island.  Vasculature: Atherosclerosis. Stable dilation  of the cisterna chyli at the level of the diaphragmatic hiatus.  Body Wall: Ventral hernias described above under the small bowel and colon sections.  IMPRESSION: 1. Three ventral hernias, the more inferior right periumbilical hernia which appears to be partially obstructing with relative decompression of distal small bowel. Some contrast does pass through the herniated bowel. Recommend assessment for reducible hernia. The appearance is very similar to the prior exam 08/07/2012. 2. Postoperative changes of bowel. 3. Expected evolution of right renal infarct. 4. Cholecystectomy. 5. Moderate hiatal hernia. 6. Fluid levels within the colon are nonspecific and may be secondary to bowel obstruction.   Electronically Signed   By: Dereck Ligas M.D.   On: 08/17/2013 14:37   Dg Abd 2 Views  08/18/2013   CLINICAL DATA:  80 year old male with abdominal pain. Small bowel obstruction. Initial encounter.  EXAM: ABDOMEN - 2 VIEW  COMPARISON:  CT Abdomen and Pelvis 08/17/2013.  FINDINGS: Supine and left-side-down lateral decubitus views of the abdomen at 1104 hrs. Interval passage of oral contrast to the rectosigmoid colon. Overall stable bowel gas pattern. Stable right upper quadrant and lower pelvic surgical clips. No pneumoperitoneum identified. Cardiac pacemaker lead. Stable visualized osseous structures.  IMPRESSION: Stable bowel gas pattern. Interval passage of oral contrast to the rectosigmoid colon arguing against small bowel obstruction. No pneumoperitoneum identified.   Electronically Signed   By: Lars Pinks M.D.   On: 08/18/2013 12:20   Dg Abd Portable 1v  08/19/2013   CLINICAL DATA:  Small-bowel obstruction.  EXAM: PORTABLE ABDOMEN - 1 VIEW  COMPARISON:  DG ABD 2 VIEWS dated 08/18/2013  FINDINGS: Surgical clips throughout the upper abdomen. Gas pattern is nonspecific. Degenerative changes lumbar spine and both hips.  IMPRESSION: Nonspecific exam.  No evidence of bowel obstruction.   Electronically Signed   By:  Marcello Moores  Register   On: 08/19/2013 10:02    Microbiology: No results found for this or any previous visit (from the past 240 hour(s)).   Labs: Basic Metabolic Panel:  Recent Labs Lab 08/17/13 1100 08/17/13 1531 08/18/13 0433 08/19/13 0358  NA 142  --  140 140  K 3.5*  --  3.7 4.4  CL 110  --  109 112  CO2 21  --  18* 18*  GLUCOSE 123*  --  85 81  BUN 18  --  21 24*  CREATININE 1.50*  --  1.53* 1.55*  CALCIUM 8.1*  --  8.2* 8.4  MG  --  1.8  --   --    Liver Function Tests:  Recent  Labs Lab 08/17/13 1100 08/18/13 0433  AST 19 21  ALT 6 6  ALKPHOS 76 68  BILITOT 0.3 0.3  PROT 6.1 5.9*  ALBUMIN 3.2* 3.0*    Recent Labs Lab 08/17/13 1100  LIPASE 17   No results found for this basename: AMMONIA,  in the last 168 hours CBC:  Recent Labs Lab 08/17/13 1100 08/18/13 0433 08/18/13 1115  WBC 10.2 6.9 7.1  NEUTROABS 9.4* 5.4  --   HGB 10.7* 10.4* 10.9*  HCT 31.4* 32.3* 33.5*  MCV 75.5* 76.0* 76.1*  PLT 121* 125* 119*   Cardiac Enzymes:  Recent Labs Lab 08/17/13 1100 08/17/13 1531  TROPONINI <0.30 <0.30   BNP: BNP (last 3 results) No results found for this basename: PROBNP,  in the last 8760 hours CBG: No results found for this basename: GLUCAP,  in the last 168 hours  Time coordinating discharge: Over 30 minutes

## 2013-08-19 NOTE — Progress Notes (Signed)
Pt discharged home with niece in stable condition. Discharge instructions and script given. Pt verbalized understanding.

## 2013-08-20 NOTE — Progress Notes (Signed)
Discharge summary sent to payer through [facility]  

## 2013-10-12 ENCOUNTER — Telehealth: Payer: Self-pay | Admitting: Cardiology

## 2013-10-12 ENCOUNTER — Ambulatory Visit (INDEPENDENT_AMBULATORY_CARE_PROVIDER_SITE_OTHER): Payer: Medicare Other | Admitting: *Deleted

## 2013-10-12 DIAGNOSIS — I495 Sick sinus syndrome: Secondary | ICD-10-CM

## 2013-10-12 DIAGNOSIS — I4891 Unspecified atrial fibrillation: Secondary | ICD-10-CM

## 2013-10-12 NOTE — Telephone Encounter (Signed)
LMOVM reminding pt to send remote transmission.   

## 2013-10-13 NOTE — Progress Notes (Signed)
Remote pacemaker transmission.   

## 2013-10-21 LAB — MDC_IDC_ENUM_SESS_TYPE_REMOTE
Battery Remaining Longevity: 104 mo
Battery Voltage: 2.78 V
Date Time Interrogation Session: 20150527044104
Lead Channel Impedance Value: 0 Ohm
Lead Channel Pacing Threshold Amplitude: 0.75 V
Lead Channel Pacing Threshold Pulse Width: 0.4 ms
Lead Channel Sensing Intrinsic Amplitude: 22.4 mV
Lead Channel Setting Pacing Amplitude: 2.5 V
MDC IDC MSMT BATTERY IMPEDANCE: 227 Ohm
MDC IDC MSMT LEADCHNL RV IMPEDANCE VALUE: 474 Ohm
MDC IDC SET LEADCHNL RV PACING PULSEWIDTH: 0.4 ms
MDC IDC SET LEADCHNL RV SENSING SENSITIVITY: 5.6 mV
MDC IDC STAT BRADY RV PERCENT PACED: 42 %

## 2013-10-29 ENCOUNTER — Encounter: Payer: Self-pay | Admitting: Cardiology

## 2013-11-02 ENCOUNTER — Encounter: Payer: Self-pay | Admitting: Internal Medicine

## 2014-01-04 ENCOUNTER — Other Ambulatory Visit: Payer: Self-pay

## 2014-01-17 ENCOUNTER — Encounter: Payer: Medicare Other | Admitting: *Deleted

## 2014-01-17 ENCOUNTER — Telehealth: Payer: Self-pay | Admitting: Cardiology

## 2014-01-17 NOTE — Telephone Encounter (Signed)
LMOVM reminding pt to send remote transmission.   

## 2014-01-18 ENCOUNTER — Encounter: Payer: Self-pay | Admitting: Cardiology

## 2014-01-26 ENCOUNTER — Ambulatory Visit (INDEPENDENT_AMBULATORY_CARE_PROVIDER_SITE_OTHER): Payer: Medicare Other | Admitting: *Deleted

## 2014-01-26 DIAGNOSIS — I495 Sick sinus syndrome: Secondary | ICD-10-CM

## 2014-01-26 NOTE — Progress Notes (Signed)
Remote pacemaker transmission.   

## 2014-01-29 LAB — MDC_IDC_ENUM_SESS_TYPE_REMOTE
Battery Remaining Longevity: 99 mo
Battery Voltage: 2.78 V
Brady Statistic RV Percent Paced: 44 %
Lead Channel Impedance Value: 0 Ohm
Lead Channel Pacing Threshold Amplitude: 0.75 V
Lead Channel Sensing Intrinsic Amplitude: 16 mV
Lead Channel Setting Pacing Amplitude: 2.5 V
Lead Channel Setting Sensing Sensitivity: 5.6 mV
MDC IDC MSMT BATTERY IMPEDANCE: 274 Ohm
MDC IDC MSMT LEADCHNL RV IMPEDANCE VALUE: 461 Ohm
MDC IDC MSMT LEADCHNL RV PACING THRESHOLD PULSEWIDTH: 0.4 ms
MDC IDC SESS DTM: 20150909115001
MDC IDC SET LEADCHNL RV PACING PULSEWIDTH: 0.4 ms

## 2014-02-08 ENCOUNTER — Encounter: Payer: Self-pay | Admitting: Cardiology

## 2014-02-09 ENCOUNTER — Encounter: Payer: Self-pay | Admitting: Internal Medicine

## 2014-04-28 ENCOUNTER — Encounter (HOSPITAL_COMMUNITY): Payer: Self-pay | Admitting: Internal Medicine

## 2014-05-02 ENCOUNTER — Encounter: Payer: Medicare Other | Admitting: *Deleted

## 2014-05-02 ENCOUNTER — Telehealth: Payer: Self-pay | Admitting: Cardiology

## 2014-05-02 NOTE — Telephone Encounter (Signed)
LMOVM reminding pt to send remote transmission.   

## 2014-05-06 ENCOUNTER — Encounter: Payer: Self-pay | Admitting: Cardiology

## 2014-05-27 ENCOUNTER — Ambulatory Visit (INDEPENDENT_AMBULATORY_CARE_PROVIDER_SITE_OTHER): Payer: Medicare Other | Admitting: *Deleted

## 2014-05-27 DIAGNOSIS — Z950 Presence of cardiac pacemaker: Secondary | ICD-10-CM

## 2014-05-27 DIAGNOSIS — I482 Chronic atrial fibrillation, unspecified: Secondary | ICD-10-CM

## 2014-05-27 LAB — MDC_IDC_ENUM_SESS_TYPE_REMOTE
Battery Impedance: 299 Ohm
Battery Voltage: 2.78 V
Brady Statistic RV Percent Paced: 45 %
Lead Channel Impedance Value: 0 Ohm
Lead Channel Impedance Value: 509 Ohm
Lead Channel Setting Pacing Amplitude: 2.5 V
Lead Channel Setting Pacing Pulse Width: 0.4 ms
Lead Channel Setting Sensing Sensitivity: 5.6 mV
MDC IDC MSMT BATTERY REMAINING LONGEVITY: 98 mo
MDC IDC MSMT LEADCHNL RV PACING THRESHOLD AMPLITUDE: 0.625 V
MDC IDC MSMT LEADCHNL RV PACING THRESHOLD PULSEWIDTH: 0.4 ms
MDC IDC MSMT LEADCHNL RV SENSING INTR AMPL: 16 mV
MDC IDC SESS DTM: 20160108235226

## 2014-05-31 NOTE — Progress Notes (Signed)
Pacemaker remote check. Device function reviewed. Impedance, sensing, auto capture threshold consistent with previous measurements. Histogram appropriate for patient and level of activity. All other diagnostic data reviewed and is appropriate and stable for patient. Real time/magnet EGM shows appropriate sensing and capture. 7 ventricular high rate episodes---longest 6sec. Estimated longevity 20yrs. Due to see GT.

## 2014-06-06 ENCOUNTER — Encounter: Payer: Self-pay | Admitting: Cardiology

## 2014-06-20 ENCOUNTER — Encounter: Payer: Self-pay | Admitting: Internal Medicine

## 2014-08-18 ENCOUNTER — Encounter (HOSPITAL_COMMUNITY): Payer: Self-pay | Admitting: Emergency Medicine

## 2014-08-18 ENCOUNTER — Emergency Department (HOSPITAL_COMMUNITY)
Admission: EM | Admit: 2014-08-18 | Discharge: 2014-08-18 | Disposition: A | Discharge status: Home / Self Care | Payer: Medicare Other | Source: Home / Self Care | Attending: Family Medicine | Admitting: Family Medicine

## 2014-08-18 DIAGNOSIS — H9203 Otalgia, bilateral: Secondary | ICD-10-CM

## 2014-08-18 DIAGNOSIS — H9193 Unspecified hearing loss, bilateral: Secondary | ICD-10-CM

## 2014-08-18 NOTE — ED Notes (Signed)
Requesting to have ears cleared of wax build up.  Reports trying otc wax removal systems, but reports no improvement.

## 2014-08-18 NOTE — ED Provider Notes (Signed)
CSN: [id number]     Arrival date & time 08/18/14  2355 History   None    Chief Complaint  Patient presents with  . Cerumen Impaction   (Consider location/radiation/quality/duration/timing/severity/associated sxs/prior Treatment) Patient is a 81 y.o. male presenting with plugged ear sensation. The history is provided by the patient and a relative.  Ear Fullness This is a new problem. Episode onset: concern about cerumen impaction. h/o same. The problem has not changed since onset.   Past Medical History  Diagnosis Date  . Tachycardia-bradycardia syndrome   . Atrial fibrillation   . Hypertension   . Diabetes mellitus   . Hx SBO     Last 2013 all treated conservatively  . Ventral hernia     x3  . Prostate cancer    Past Surgical History  Procedure Laterality Date  . Exploratory laparotomy    . Small intestine surgery  09/16/2004    SBO resection, VH repair  . Hemorrhoid surgery    . Hiatal hernia repair  2002  . Pacemaker insertion    . Cholecystectomy open  03/31/2001    Dr Lindon Romp  . Permanent pacemaker generator change N/A 04/06/2012    Procedure: PERMANENT PACEMAKER GENERATOR CHANGE;  Surgeon: Evans Lance, MD;  Location: [facility] CATH LAB;  Service: Cardiovascular;  Laterality: N/A;   Family History  Problem Relation Age of Onset  . Pneumonia Father   . Cancer Brother     type unknown   History  Substance Use Topics  . Smoking status: Former Smoker -- 1.50 packs/day    Quit date: 04/16/1955  . Smokeless tobacco: Former Systems developer    Types: Snuff, Sarina Ser    Quit date: 08/17/1952  . Alcohol Use: No    Review of Systems  Constitutional: Negative.   HENT: Positive for ear pain.   Eyes: Negative.     Allergies  Review of patient's allergies indicates no known allergies.  Home Medications   Prior to Admission medications   Medication Sig Start Date End Date Taking? Authorizing Provider  diltiazem (CARDIZEM LA) 240 MG 24 hr tablet Take 240 mg by mouth at bedtime.      Historical Provider, MD  metoprolol tartrate (LOPRESSOR) 25 MG tablet Take 25 mg by mouth 2 (two) times daily.     Historical Provider, MD  ondansetron (ZOFRAN) 4 MG tablet Take 1 tablet (4 mg total) by mouth every 6 (six) hours as needed for nausea. 08/19/13   Robbie Lis, MD  pseudoephedrine-acetaminophen (TYLENOL SINUS) 30-500 MG TABS Take 1 tablet by mouth every 4 (four) hours as needed (congestion/headache).    Historical Provider, MD  warfarin (COUMADIN) 3 MG tablet Take 3 mg by mouth every other day.     Historical Provider, MD  warfarin (COUMADIN) 4 MG tablet Take 4 mg by mouth every other day.    Historical Provider, MD   BP 134/85 mmHg  Pulse 84  Temp(Src) 97.6 F (36.4 C) (Oral)  Resp 18  SpO2 95% Physical Exam  Constitutional: He is oriented to person, place, and time. He appears well-developed and well-nourished.  HENT:  Right Ear: External ear normal.  Left Ear: External ear normal.  Mouth/Throat: Oropharynx is clear and moist.  Eyes: Conjunctivae are normal. Pupils are equal, round, and reactive to light.  Neck: Normal range of motion. Neck supple.  Lymphadenopathy:    He has no cervical adenopathy.  Neurological: He is alert and oriented to person, place, and time.  Skin: Skin is warm and  dry.  Nursing note and vitals reviewed.   ED Course  Procedures (including critical care time) Labs Review Labs Reviewed - No data to display  Imaging Review No results found.   MDM   1. Hearing deficit, bilateral       Billy Fischer, MD 08/18/14 2023

## 2014-08-18 NOTE — Discharge Instructions (Signed)
See your doctor if further problems. °

## 2014-08-23 ENCOUNTER — Encounter: Payer: Self-pay | Admitting: Internal Medicine

## 2014-08-23 ENCOUNTER — Ambulatory Visit (INDEPENDENT_AMBULATORY_CARE_PROVIDER_SITE_OTHER): Payer: Medicare Other | Admitting: Internal Medicine

## 2014-08-23 VITALS — BP 108/90 | HR 82 | Ht 71.0 in | Wt 161.8 lb

## 2014-08-23 DIAGNOSIS — I482 Chronic atrial fibrillation, unspecified: Secondary | ICD-10-CM

## 2014-08-23 DIAGNOSIS — I495 Sick sinus syndrome: Secondary | ICD-10-CM | POA: Diagnosis not present

## 2014-08-23 DIAGNOSIS — Z950 Presence of cardiac pacemaker: Secondary | ICD-10-CM | POA: Diagnosis not present

## 2014-08-23 LAB — MDC_IDC_ENUM_SESS_TYPE_INCLINIC
Battery Impedance: 347 Ohm
Battery Remaining Longevity: 94 mo
Date Time Interrogation Session: 20160405115004
Lead Channel Pacing Threshold Pulse Width: 0.4 ms
Lead Channel Sensing Intrinsic Amplitude: 22.4 mV
Lead Channel Setting Sensing Sensitivity: 5.6 mV
MDC IDC MSMT BATTERY VOLTAGE: 2.78 V
MDC IDC MSMT LEADCHNL RA IMPEDANCE VALUE: 0 Ohm
MDC IDC MSMT LEADCHNL RV IMPEDANCE VALUE: 488 Ohm
MDC IDC MSMT LEADCHNL RV PACING THRESHOLD AMPLITUDE: 0.75 V
MDC IDC SET LEADCHNL RV PACING AMPLITUDE: 2.5 V
MDC IDC SET LEADCHNL RV PACING PULSEWIDTH: 0.4 ms
MDC IDC STAT BRADY RV PERCENT PACED: 45 %

## 2014-08-23 NOTE — Assessment & Plan Note (Signed)
His Medtronic single-chamber pacemaker is working normally. We'll plan to recheck in several months.

## 2014-08-23 NOTE — Progress Notes (Signed)
HPI [name] returns today for followup. He is a very pleasant 81 year old man with chronic atrial fibrillation and symptomatic bradycardia, status post permanent pacemaker insertion. In the interim, he has done well. He denies syncope, chest pain, shortness of breath, or palpitations. No peripheral edema. No Known Allergies   Current Outpatient Prescriptions  Medication Sig Dispense Refill  . diltiazem (CARDIZEM LA) 240 MG 24 hr tablet Take 240 mg by mouth at bedtime.     . metoprolol tartrate (LOPRESSOR) 25 MG tablet Take 25 mg by mouth 2 (two) times daily.     . ondansetron (ZOFRAN) 4 MG tablet Take 1 tablet (4 mg total) by mouth every 6 (six) hours as needed for nausea. 20 tablet 0  . pseudoephedrine-acetaminophen (TYLENOL SINUS) 30-500 MG TABS Take 1 tablet by mouth every 4 (four) hours as needed (congestion/headache).    . warfarin (COUMADIN) 3 MG tablet Take 3 mg by mouth every other day.     . warfarin (COUMADIN) 4 MG tablet Take 4 mg by mouth every other day.     No current facility-administered medications for this visit.     Past Medical History  Diagnosis Date  . Tachycardia-bradycardia syndrome   . Atrial fibrillation   . Hypertension   . Diabetes mellitus   . Hx SBO     Last 2013 all treated conservatively  . Ventral hernia     x3  . Prostate cancer     ROS:   All systems reviewed and negative except as noted in the HPI.   Past Surgical History  Procedure Laterality Date  . Exploratory laparotomy    . Small intestine surgery  09/16/2004    SBO resection, VH repair  . Hemorrhoid surgery    . Hiatal hernia repair  2002  . Pacemaker insertion    . Cholecystectomy open  03/31/2001    Dr Lindon Romp  . Permanent pacemaker generator change N/A 04/06/2012    Procedure: PERMANENT PACEMAKER GENERATOR CHANGE;  Surgeon: Evans Lance, MD;  Location: [facility] CATH LAB;  Service: Cardiovascular;  Laterality: N/A;     Family History  Problem Relation Age of Onset  .  Pneumonia Father   . Cancer Brother     type unknown     History   Social History  . Marital Status: Widowed    Spouse Name: N/A  . Number of Children: N/A  . Years of Education: N/A   Occupational History  . Not on file.   Social History Main Topics  . Smoking status: Former Smoker -- 1.50 packs/day    Quit date: 04/16/1955  . Smokeless tobacco: Former Systems developer    Types: Snuff, Sarina Ser    Quit date: 08/17/1952  . Alcohol Use: No  . Drug Use: No  . Sexual Activity: Not on file   Other Topics Concern  . Not on file   Social History Narrative     BP 108/90 mmHg  Pulse 82  Ht 5\' 11"  (1.803 m)  Wt 161 lb 12.8 oz (73.392 kg)  BMI 22.58 kg/m2  Physical Exam:  Well appearing elderly man,NAD HEENT: Unremarkable Neck:  7 cm JVD, no thyromegally Lungs:  Clear with no wheezes, rales, or rhonchi. HEART:  IRegular rate rhythm, no murmurs, no rubs, no clicks Abd:  soft, positive bowel sounds, no organomegally, no rebound, no guarding Ext:  2 plus pulses, no edema, no cyanosis, no clubbing Skin:  No rashes no nodules Neuro:  CN II through XII intact, motor grossly intact  DEVICE  Normal device function.  See PaceArt for details.   Assess/Plan:

## 2014-08-23 NOTE — Assessment & Plan Note (Signed)
He is doing well status post pacemaker insertion. He is asymptomatic.

## 2014-08-23 NOTE — Assessment & Plan Note (Signed)
His ventricular rate is well controlled. He will continue systemic anticoagulation. He will continue his current medical therapy.

## 2014-08-23 NOTE — Patient Instructions (Addendum)
Remote monitoring is used to monitor your pacemaker from home. This monitoring reduces the number of office visits required to check your device to one time per year. It allows [place] to keep an eye on the functioning of your device to ensure it is working properly. You are scheduled for a device check from home on 11-22-2014. You may send your transmission at any time that day. If you have a wireless device, the transmission will be sent automatically. After your physician reviews your transmission, you will receive a postcard with your next transmission date.  Your physician recommends that you schedule a follow-up appointment in: 12 months with Dr.Taylor  Your physician recommends that you continue on your current medications as directed. Please refer to the Current Medication list given to you today.

## 2014-11-22 ENCOUNTER — Encounter: Payer: Medicare Other | Admitting: *Deleted

## 2014-11-22 ENCOUNTER — Telehealth: Payer: Self-pay | Admitting: Cardiology

## 2014-11-22 NOTE — Telephone Encounter (Signed)
LMOVM reminding pt to send remote transmission.   

## 2014-11-23 ENCOUNTER — Encounter: Payer: Self-pay | Admitting: Cardiology

## 2014-11-28 ENCOUNTER — Ambulatory Visit (INDEPENDENT_AMBULATORY_CARE_PROVIDER_SITE_OTHER): Payer: Medicare Other | Admitting: *Deleted

## 2014-11-28 DIAGNOSIS — I495 Sick sinus syndrome: Secondary | ICD-10-CM

## 2014-11-29 DIAGNOSIS — I495 Sick sinus syndrome: Secondary | ICD-10-CM

## 2014-11-29 DIAGNOSIS — H26499 Other secondary cataract, unspecified eye: Secondary | ICD-10-CM | POA: Insufficient documentation

## 2014-12-01 NOTE — Progress Notes (Signed)
Remote pacemaker transmission.   

## 2014-12-05 LAB — CUP PACEART REMOTE DEVICE CHECK
Battery Impedance: 469 Ohm
Battery Remaining Longevity: 87 mo
Brady Statistic RV Percent Paced: 40 %
Date Time Interrogation Session: 20160712194745
Lead Channel Impedance Value: 0 Ohm
Lead Channel Impedance Value: 493 Ohm
Lead Channel Setting Pacing Amplitude: 2.5 V
Lead Channel Setting Pacing Pulse Width: 0.4 ms
Lead Channel Setting Sensing Sensitivity: 5.6 mV
MDC IDC MSMT BATTERY VOLTAGE: 2.77 V
MDC IDC MSMT LEADCHNL RV PACING THRESHOLD AMPLITUDE: 0.75 V
MDC IDC MSMT LEADCHNL RV PACING THRESHOLD PULSEWIDTH: 0.4 ms
MDC IDC MSMT LEADCHNL RV SENSING INTR AMPL: 16 mV

## 2014-12-14 ENCOUNTER — Encounter: Payer: Self-pay | Admitting: *Deleted

## 2014-12-19 ENCOUNTER — Encounter: Payer: Self-pay | Admitting: Internal Medicine

## 2015-03-02 ENCOUNTER — Encounter: Payer: Medicare Other | Admitting: *Deleted

## 2015-03-02 ENCOUNTER — Telehealth: Payer: Self-pay | Admitting: Cardiology

## 2015-03-02 NOTE — Telephone Encounter (Signed)
LMOVM reminding pt to send remote transmission.   

## 2015-03-06 ENCOUNTER — Encounter: Payer: Self-pay | Admitting: Cardiology

## 2015-03-14 ENCOUNTER — Ambulatory Visit (INDEPENDENT_AMBULATORY_CARE_PROVIDER_SITE_OTHER): Payer: Medicare Other | Admitting: *Deleted

## 2015-03-14 DIAGNOSIS — I495 Sick sinus syndrome: Secondary | ICD-10-CM

## 2015-03-15 DIAGNOSIS — H401124 Primary open-angle glaucoma, left eye, indeterminate stage: Secondary | ICD-10-CM | POA: Insufficient documentation

## 2015-03-15 DIAGNOSIS — H40001 Preglaucoma, unspecified, right eye: Secondary | ICD-10-CM | POA: Insufficient documentation

## 2015-03-15 NOTE — Progress Notes (Signed)
Remote pacemaker transmission.   

## 2015-03-21 LAB — CUP PACEART REMOTE DEVICE CHECK
Battery Remaining Longevity: 83 mo
Battery Voltage: 2.78 V
Date Time Interrogation Session: 20161025215013
Implantable Lead Location: 753860
Implantable Lead Model: 5076
Lead Channel Impedance Value: 0 Ohm
Lead Channel Pacing Threshold Amplitude: 0.625 V
Lead Channel Setting Pacing Amplitude: 2.5 V
Lead Channel Setting Pacing Pulse Width: 0.4 ms
MDC IDC LEAD IMPLANT DT: 20060510
MDC IDC MSMT BATTERY IMPEDANCE: 518 Ohm
MDC IDC MSMT LEADCHNL RV IMPEDANCE VALUE: 491 Ohm
MDC IDC MSMT LEADCHNL RV PACING THRESHOLD PULSEWIDTH: 0.4 ms
MDC IDC MSMT LEADCHNL RV SENSING INTR AMPL: 16 mV
MDC IDC SET LEADCHNL RV SENSING SENSITIVITY: 5.6 mV
MDC IDC STAT BRADY RV PERCENT PACED: 41 %

## 2015-03-22 ENCOUNTER — Encounter: Payer: Self-pay | Admitting: Cardiology

## 2015-05-21 DIAGNOSIS — I639 Cerebral infarction, unspecified: Secondary | ICD-10-CM

## 2015-05-21 HISTORY — DX: Cerebral infarction, unspecified: I63.9

## 2015-06-13 ENCOUNTER — Encounter: Payer: Medicare Other | Admitting: *Deleted

## 2015-06-13 ENCOUNTER — Telehealth: Payer: Self-pay | Admitting: Cardiology

## 2015-06-13 NOTE — Telephone Encounter (Signed)
LMOVM reminding pt to send remote transmission.   

## 2015-06-14 ENCOUNTER — Encounter: Payer: Self-pay | Admitting: Cardiology

## 2015-06-20 ENCOUNTER — Ambulatory Visit (INDEPENDENT_AMBULATORY_CARE_PROVIDER_SITE_OTHER): Payer: Medicare Other | Admitting: *Deleted

## 2015-06-20 DIAGNOSIS — I495 Sick sinus syndrome: Secondary | ICD-10-CM

## 2015-06-21 NOTE — Progress Notes (Signed)
Remote pacemaker transmission.   

## 2015-07-04 ENCOUNTER — Encounter: Payer: Self-pay | Admitting: Cardiology

## 2015-07-04 LAB — CUP PACEART REMOTE DEVICE CHECK
Battery Voltage: 2.78 V
Date Time Interrogation Session: 20170131221644
Implantable Lead Implant Date: 20060510
Implantable Lead Location: 753860
Lead Channel Impedance Value: 0 Ohm
Lead Channel Impedance Value: 499 Ohm
Lead Channel Pacing Threshold Amplitude: 0.875 V
Lead Channel Pacing Threshold Pulse Width: 0.4 ms
Lead Channel Sensing Intrinsic Amplitude: 16 mV
Lead Channel Setting Sensing Sensitivity: 5.6 mV
MDC IDC MSMT BATTERY IMPEDANCE: 593 Ohm
MDC IDC MSMT BATTERY REMAINING LONGEVITY: 79 mo
MDC IDC SET LEADCHNL RV PACING AMPLITUDE: 2.5 V
MDC IDC SET LEADCHNL RV PACING PULSEWIDTH: 0.4 ms
MDC IDC STAT BRADY RV PERCENT PACED: 41 %

## 2015-07-23 ENCOUNTER — Encounter (HOSPITAL_COMMUNITY)
Admission: EM | Disposition: A | Discharge status: Home Health | Payer: Self-pay | Source: Home / Self Care | Attending: Internal Medicine

## 2015-07-23 ENCOUNTER — Emergency Department (HOSPITAL_COMMUNITY): Payer: Medicare Other

## 2015-07-23 ENCOUNTER — Inpatient Hospital Stay (HOSPITAL_COMMUNITY): Payer: Medicare Other

## 2015-07-23 ENCOUNTER — Encounter (HOSPITAL_COMMUNITY): Payer: Self-pay | Admitting: Internal Medicine

## 2015-07-23 ENCOUNTER — Encounter (HOSPITAL_COMMUNITY): Payer: Self-pay | Admitting: Anesthesiology

## 2015-07-23 ENCOUNTER — Inpatient Hospital Stay (HOSPITAL_COMMUNITY)
Admission: EM | Admit: 2015-07-23 | Discharge: 2015-07-25 | DRG: 065 | Disposition: A | Discharge status: Home Health | Payer: Medicare Other | Attending: Internal Medicine | Admitting: Internal Medicine

## 2015-07-23 DIAGNOSIS — R29810 Facial weakness: Secondary | ICD-10-CM | POA: Diagnosis present

## 2015-07-23 DIAGNOSIS — I639 Cerebral infarction, unspecified: Secondary | ICD-10-CM | POA: Diagnosis not present

## 2015-07-23 DIAGNOSIS — R471 Dysarthria and anarthria: Secondary | ICD-10-CM | POA: Diagnosis present

## 2015-07-23 DIAGNOSIS — I4891 Unspecified atrial fibrillation: Secondary | ICD-10-CM | POA: Diagnosis present

## 2015-07-23 DIAGNOSIS — N184 Chronic kidney disease, stage 4 (severe): Secondary | ICD-10-CM | POA: Diagnosis present

## 2015-07-23 DIAGNOSIS — Z950 Presence of cardiac pacemaker: Secondary | ICD-10-CM | POA: Diagnosis not present

## 2015-07-23 DIAGNOSIS — G8194 Hemiplegia, unspecified affecting left nondominant side: Secondary | ICD-10-CM | POA: Diagnosis present

## 2015-07-23 DIAGNOSIS — I482 Chronic atrial fibrillation: Secondary | ICD-10-CM | POA: Diagnosis not present

## 2015-07-23 DIAGNOSIS — R531 Weakness: Secondary | ICD-10-CM | POA: Diagnosis present

## 2015-07-23 DIAGNOSIS — N183 Chronic kidney disease, stage 3 (moderate): Secondary | ICD-10-CM | POA: Diagnosis present

## 2015-07-23 DIAGNOSIS — Z79899 Other long term (current) drug therapy: Secondary | ICD-10-CM

## 2015-07-23 DIAGNOSIS — Z87891 Personal history of nicotine dependence: Secondary | ICD-10-CM

## 2015-07-23 DIAGNOSIS — D649 Anemia, unspecified: Secondary | ICD-10-CM | POA: Diagnosis present

## 2015-07-23 DIAGNOSIS — E1122 Type 2 diabetes mellitus with diabetic chronic kidney disease: Secondary | ICD-10-CM | POA: Diagnosis present

## 2015-07-23 DIAGNOSIS — I129 Hypertensive chronic kidney disease with stage 1 through stage 4 chronic kidney disease, or unspecified chronic kidney disease: Secondary | ICD-10-CM | POA: Diagnosis present

## 2015-07-23 DIAGNOSIS — I6789 Other cerebrovascular disease: Secondary | ICD-10-CM | POA: Diagnosis not present

## 2015-07-23 DIAGNOSIS — I63411 Cerebral infarction due to embolism of right middle cerebral artery: Secondary | ICD-10-CM | POA: Diagnosis not present

## 2015-07-23 DIAGNOSIS — Z8546 Personal history of malignant neoplasm of prostate: Secondary | ICD-10-CM | POA: Diagnosis not present

## 2015-07-23 DIAGNOSIS — E119 Type 2 diabetes mellitus without complications: Secondary | ICD-10-CM

## 2015-07-23 DIAGNOSIS — D509 Iron deficiency anemia, unspecified: Secondary | ICD-10-CM | POA: Diagnosis present

## 2015-07-23 DIAGNOSIS — I480 Paroxysmal atrial fibrillation: Secondary | ICD-10-CM | POA: Diagnosis present

## 2015-07-23 DIAGNOSIS — Z7901 Long term (current) use of anticoagulants: Secondary | ICD-10-CM | POA: Diagnosis not present

## 2015-07-23 DIAGNOSIS — N189 Chronic kidney disease, unspecified: Secondary | ICD-10-CM | POA: Diagnosis present

## 2015-07-23 HISTORY — DX: Chronic kidney disease, unspecified: N18.9

## 2015-07-23 LAB — CBC
HCT: 33.6 % — ABNORMAL LOW (ref 39.0–52.0)
HEMOGLOBIN: 10.8 g/dL — AB (ref 13.0–17.0)
MCH: 24.8 pg — AB (ref 26.0–34.0)
MCHC: 32.1 g/dL (ref 30.0–36.0)
MCV: 77.2 fL — ABNORMAL LOW (ref 78.0–100.0)
Platelets: 149 10*3/uL — ABNORMAL LOW (ref 150–400)
RBC: 4.35 MIL/uL (ref 4.22–5.81)
RDW: 15.6 % — ABNORMAL HIGH (ref 11.5–15.5)
WBC: 6.9 10*3/uL (ref 4.0–10.5)

## 2015-07-23 LAB — DIFFERENTIAL
BASOS PCT: 0 %
Basophils Absolute: 0 10*3/uL (ref 0.0–0.1)
EOS PCT: 1 %
Eosinophils Absolute: 0.1 10*3/uL (ref 0.0–0.7)
LYMPHS PCT: 29 %
Lymphs Abs: 2 10*3/uL (ref 0.7–4.0)
Monocytes Absolute: 0.5 10*3/uL (ref 0.1–1.0)
Monocytes Relative: 7 %
NEUTROS PCT: 63 %
Neutro Abs: 4.4 10*3/uL (ref 1.7–7.7)

## 2015-07-23 LAB — I-STAT CHEM 8, ED
BUN: 13 mg/dL (ref 6–20)
CHLORIDE: 104 mmol/L (ref 101–111)
Calcium, Ion: 1.11 mmol/L — ABNORMAL LOW (ref 1.13–1.30)
Creatinine, Ser: 1.5 mg/dL — ABNORMAL HIGH (ref 0.61–1.24)
Glucose, Bld: 73 mg/dL (ref 65–99)
HEMATOCRIT: 38 % — AB (ref 39.0–52.0)
HEMOGLOBIN: 12.9 g/dL — AB (ref 13.0–17.0)
POTASSIUM: 4.1 mmol/L (ref 3.5–5.1)
SODIUM: 142 mmol/L (ref 135–145)
TCO2: 24 mmol/L (ref 0–100)

## 2015-07-23 LAB — COMPREHENSIVE METABOLIC PANEL
ALBUMIN: 3.5 g/dL (ref 3.5–5.0)
ALK PHOS: 75 U/L (ref 38–126)
ALT: 9 U/L — ABNORMAL LOW (ref 17–63)
ANION GAP: 10 (ref 5–15)
AST: 22 U/L (ref 15–41)
BUN: 11 mg/dL (ref 6–20)
CALCIUM: 8.4 mg/dL — AB (ref 8.9–10.3)
CHLORIDE: 106 mmol/L (ref 101–111)
CO2: 24 mmol/L (ref 22–32)
Creatinine, Ser: 1.51 mg/dL — ABNORMAL HIGH (ref 0.61–1.24)
GFR calc Af Amer: 45 mL/min — ABNORMAL LOW (ref >60)
GFR calc non Af Amer: 39 mL/min — ABNORMAL LOW (ref >60)
GLUCOSE: 75 mg/dL (ref 65–99)
POTASSIUM: 4.2 mmol/L (ref 3.5–5.1)
SODIUM: 140 mmol/L (ref 135–145)
Total Bilirubin: 0.4 mg/dL (ref 0.3–1.2)
Total Protein: 5.9 g/dL — ABNORMAL LOW (ref 6.5–8.1)

## 2015-07-23 LAB — APTT: aPTT: 30 seconds (ref 24–37)

## 2015-07-23 LAB — I-STAT TROPONIN, ED: Troponin i, poc: 0.02 ng/mL (ref 0.00–0.08)

## 2015-07-23 LAB — LIPID PANEL
Cholesterol: 122 mg/dL (ref 0–200)
HDL: 43 mg/dL (ref >40)
LDL CALC: 68 mg/dL (ref 0–99)
TRIGLYCERIDES: 55 mg/dL (ref <150)
Total CHOL/HDL Ratio: 2.8 RATIO
VLDL: 11 mg/dL (ref 0–40)

## 2015-07-23 LAB — GLUCOSE, CAPILLARY
GLUCOSE-CAPILLARY: 74 mg/dL (ref 65–99)
Glucose-Capillary: 86 mg/dL (ref 65–99)

## 2015-07-23 LAB — CBG MONITORING, ED
GLUCOSE-CAPILLARY: 64 mg/dL — AB (ref 65–99)
GLUCOSE-CAPILLARY: 130 mg/dL — AB (ref 65–99)

## 2015-07-23 LAB — MRSA PCR SCREENING: MRSA BY PCR: NEGATIVE

## 2015-07-23 LAB — PROTIME-INR
INR: 1.16 (ref 0.00–1.49)
Prothrombin Time: 15 seconds (ref 11.6–15.2)

## 2015-07-23 SURGERY — RADIOLOGY WITH ANESTHESIA
Anesthesia: Choice

## 2015-07-23 MED ORDER — SODIUM CHLORIDE 0.9 % IV SOLN
Freq: Once | INTRAVENOUS | Status: AC
  Administered 2015-07-23: 18:00:00 via INTRAVENOUS

## 2015-07-23 MED ORDER — SENNOSIDES-DOCUSATE SODIUM 8.6-50 MG PO TABS: 1.0000 | ORAL_TABLET | Freq: Every evening | ORAL | Status: DC | PRN

## 2015-07-23 MED ORDER — DEXTROSE 50 % IV SOLN
25.0000 mL | Freq: Once | INTRAVENOUS | Status: AC
  Administered 2015-07-23: 25 mL via INTRAVENOUS
  Filled 2015-07-23: qty 50

## 2015-07-23 MED ORDER — ASPIRIN 300 MG RE SUPP
300.0000 mg | Freq: Once | RECTAL | Status: AC
  Administered 2015-07-23: 300 mg via RECTAL
  Filled 2015-07-23: qty 1

## 2015-07-23 MED ORDER — SODIUM CHLORIDE 0.9 % IV SOLN
Freq: Once | INTRAVENOUS | Status: AC
  Administered 2015-07-23: 13:00:00 via INTRAVENOUS

## 2015-07-23 MED ORDER — DEXTROSE-NACL 5-0.9 % IV SOLN
INTRAVENOUS | Status: DC
  Administered 2015-07-23: 23:00:00 via INTRAVENOUS

## 2015-07-23 MED ORDER — RIVAROXABAN 20 MG PO TABS
20.0000 mg | ORAL_TABLET | Freq: Every day | ORAL | Status: DC
  Filled 2015-07-23: qty 1

## 2015-07-23 MED ORDER — ENOXAPARIN SODIUM 40 MG/0.4ML ~~LOC~~ SOLN: 40.0000 mg | SUBCUTANEOUS | Status: DC

## 2015-07-23 MED ORDER — IOHEXOL 350 MG/ML SOLN
100.0000 mL | Freq: Once | INTRAVENOUS | Status: AC | PRN
  Administered 2015-07-23: 100 mL via INTRAVENOUS

## 2015-07-23 NOTE — ED Notes (Signed)
Pt in via [facility]. Pt was at church sitting, praying and began to have L sided facial drrop and weakness. This happened at 1200. Code stroke called, pt sent to CT scan.

## 2015-07-23 NOTE — ED Notes (Signed)
Pt had BG of 64, was given 1/2 amp of D50, BG now 130.

## 2015-07-23 NOTE — H&P (Signed)
[facility] History and Physical  BERWYN RIM [id number] DOB: [birth year] DOA: 07/23/2015  Referring physician: Ralene Bathe PCP: Foye Spurling, MD   Chief Complaint: stroke like symptoms  HPI: [name] is a delightful 82 y.o. male with a past medical history that includes A. fib on Xarelto, diabetes diet controlled, bradycardia tachycardia syndrome status post pacemaker presents to the emergency department with chief complaint left-sided weakness with left facial droop and slurred speech. Initial evaluation includes CT angio of the head revealing Occlusion of the right MCA.  Formation is obtained from the patient and grandson who is at the bedside. Patient remembers being at church and having the pastor and to other individuals asking him if he was okay area he states he was not in any pain and was unsure as to why they were inquiring. Reportedly he had a right facial droop and his speech was quite slurred. EMS was called per report he demonstrated left upper extremity and weakness as well. Patient denies any headache dizziness visual disturbances numbness or tingling of extremities. He denies any difficulty swallowing. He denies chest pain palpitations nausea vomiting shortness of breath. He denies any fever chills dysuria hematuria frequency or urgency or recent sick contacts.  The emergency department he seemed hemodynamically stable blood pressure 170/99 a heart rate is 72. He is not hypoxic. He is evaluated by neurology recommends medical admission for stroke workup.   Review of Systems:  10 point review of systems complete and all systems are negative except as indicated in the history of present illness.   Past Medical History  Diagnosis Date  . Tachycardia-bradycardia syndrome   . Atrial fibrillation   . Hypertension   . Diabetes mellitus   . Hx SBO     Last 2013 all treated conservatively  . Ventral hernia     x3  . Prostate cancer    Past Surgical History    Procedure Laterality Date  . Exploratory laparotomy    . Small intestine surgery  09/16/2004    SBO resection, VH repair  . Hemorrhoid surgery    . Hiatal hernia repair  2002  . Pacemaker insertion    . Cholecystectomy open  03/31/2001    Dr Lindon Romp  . Permanent pacemaker generator change N/A 04/06/2012    Procedure: PERMANENT PACEMAKER GENERATOR CHANGE;  Surgeon: Evans Lance, MD;  Location: [facility] CATH LAB;  Service: Cardiovascular;  Laterality: N/A;   Social History:  reports that he quit smoking about 60 years ago. He quit smokeless tobacco use about 62 years ago. His smokeless tobacco use included Snuff and Chew. He reports that he does not drink alcohol or use illicit drugs. Patient lives alone he is independent with ADLs he is a retired Administrator, [employer] No Known Allergies  Family History  Problem Relation Age of Onset  . Pneumonia Father   . Cancer Brother     type unknown     Prior to Admission medications   Medication Sig Start Date End Date Taking? Authorizing Provider  bisacodyl (BISACODYL) 5 MG EC tablet Take 5 mg by mouth daily as needed for moderate constipation.   Yes Historical Provider, MD  OVER THE COUNTER MEDICATION Take 1 tablet by mouth daily. "appetitie boost" for supplement   Yes Historical Provider, MD  polycarbophil (FIBERCON) 625 MG tablet Take 625 mg by mouth daily as needed for moderate constipation.   Yes Historical Provider, MD  Pseudoeph-Doxylamine-DM-APAP (NYQUIL PO) Take 30 mLs by mouth at bedtime as  needed (for sleep).   Yes Historical Provider, MD  diltiazem (CARDIZEM LA) 240 MG 24 hr tablet Take 240 mg by mouth at bedtime.     Historical Provider, MD  metoprolol tartrate (LOPRESSOR) 25 MG tablet Take 25 mg by mouth 2 (two) times daily.     Historical Provider, MD  ondansetron (ZOFRAN) 4 MG tablet Take 1 tablet (4 mg total) by mouth every 6 (six) hours as needed for nausea. 08/19/13   Robbie Lis, MD  pseudoephedrine-acetaminophen  (TYLENOL SINUS) 30-500 MG TABS Take 1 tablet by mouth every 4 (four) hours as needed (congestion/headache).    Historical Provider, MD   Physical Exam: Filed Vitals:   07/23/15 1430 07/23/15 1500 07/23/15 1530 07/23/15 1600  BP: 161/98 167/111 173/85 178/99  Pulse: 72  63 67  Resp: 18 19 0 14  Weight:      SpO2: 100%  97% 98%    Wt Readings from Last 3 Encounters:  07/23/15 75.8 kg (167 lb 1.7 oz)  08/23/14 73.392 kg (161 lb 12.8 oz)  08/17/13 70.9 kg (156 lb 4.9 oz)    General:  Appears calm and comfortable Well-nourished Eyes: PERRL, normal lids, irises & conjunctiva ENT: Right facial droop mucous membranes of his mouth are moist and pink, mild drooling Neck: no LAD, masses or thyromegaly Cardiovascular: Irregularly irregular heart sounds somewhat distant, no m/r/g. No LE edema.  Respiratory: CTA bilaterally, no w/r/r. Normal respiratory effort. Abdomen: soft, ntnd positive bowel sounds throughout no guarding  Skin: no rash or induration seen on limited exam, warm and dry Musculoskeletal: grossly normal tone BUE/BLE, joints without swelling/erythema Psychiatric: grossly normal mood and affect, speech fluent and appropriate Neurologic: Right facial droop, slurred speech, bilateral grip 4 out of 5 bilateral lower extremity edema 4 out of 5 no pronator drift           Labs on Admission:  Basic Metabolic Panel:  Recent Labs Lab 07/23/15 1254 07/23/15 1300  NA 140 142  K 4.2 4.1  CL 106 104  CO2 24  --   GLUCOSE 75 73  BUN 11 13  CREATININE 1.51* 1.50*  CALCIUM 8.4*  --    Liver Function Tests:  Recent Labs Lab 07/23/15 1254  AST 22  ALT 9*  ALKPHOS 75  BILITOT 0.4  PROT 5.9*  ALBUMIN 3.5   No results for input(s): LIPASE, AMYLASE in the last 168 hours. No results for input(s): AMMONIA in the last 168 hours. CBC:  Recent Labs Lab 07/23/15 1254 07/23/15 1300  WBC 6.9  --   NEUTROABS 4.4  --   HGB 10.8* 12.9*  HCT 33.6* 38.0*  MCV 77.2*  --   PLT  149*  --    Cardiac Enzymes: No results for input(s): CKTOTAL, CKMB, CKMBINDEX, TROPONINI in the last 168 hours.  BNP (last 3 results) No results for input(s): BNP in the last 8760 hours.  ProBNP (last 3 results) No results for input(s): PROBNP in the last 8760 hours.  CBG:  Recent Labs Lab 07/23/15 1455 07/23/15 1514  GLUCAP 64* 130*    Radiological Exams on Admission: Ct Angio Head W/cm &/or Wo Cm  07/23/2015  CLINICAL DATA:  Right-sided gaze disturbance of left-sided weakness. EXAM: CT ANGIOGRAPHY HEAD AND NECK with CT perfusion TECHNIQUE: Multidetector CT imaging of the head and neck was performed using the standard protocol during bolus administration of intravenous contrast. Multiplanar CT image reconstructions and MIPs were obtained to evaluate the vascular anatomy. Carotid stenosis measurements (when  applicable) are obtained utilizing NASCET criteria, using the distal internal carotid diameter as the denominator. CONTRAST:  179mL OMNIPAQUE IOHEXOL 350 MG/ML SOLN COMPARISON:  Head CT earlier same day FINDINGS: CTA NECK Aortic arch: There is atherosclerosis of the aortic arch without aneurysm or dissection. 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 No origin stenosis. Right carotid system: Common carotid artery widely patent to the bifurcation. The carotid bifurcation is normal without stenosis or irregularity. Cervical internal carotid artery is normal. Left carotid system: Common carotid artery widely patent to the bifurcation. Normal appearing bifurcation without stenosis or irregularity. Cervical internal carotid artery is normal. Vertebral arteries:Vertebral artery origins are widely patent. Left vertebral artery arises directly from the arch as noted previously. Left vertebral artery is larger than the right. Both vertebral arteries are widely patent through the cervical region. Skeleton: Ordinary spondylosis and facet  arthropathy Other neck: No mass or lymphadenopathy. Emphysema and scarring at the lung apices. CTA HEAD Anterior circulation: Both internal carotid arteries widely patent through the skullbase and siphon regions. No stenosis. The anterior and middle cerebral vessels are patent on the left without proximal stenosis, aneurysm or vascular malformation. No missing vessels are detected. On the right, there is occlusion or near occlusion of the MCA at the M1 M2 junction more distal vessels do show opacification, presumably from collaterals, though the vessels are smaller and less numerous. Posterior circulation: Both vertebral arteries patent through the foramen magnum to the basilar. No basilar stenosis. Posterior circulation branch vessels are normal. Venous sinuses: Patent and normal Anatomic variants: None significant Delayed phase: No abnormal enhancement CT perfusion Delayed mean transit time, delayed cerebral blood flow and delayed time to peak throughout a large portion of the right MCA territory including the insula, posterior frontal and parietal region. Deep portions of this territory show or reduced blood volume suggesting core infarct, while the majority of the insular region and superficial brain shows normal cerebral blood volume suggesting penumbra. Basal ganglia perfuse normally. IMPRESSION: No disease demonstrated in the neck. 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 Some distal flow is reconstituted, though the vessels are smaller and less numerous. Perfusion suggests ischemia involving the majority of the right MCA territory with sparing of the basal ganglia. Deeper tissues appear to show core infarct with more superficial tissues representing penumbra. These results were called by telephone at the time of interpretation on 07/23/2015 at 2:36 pm to Dr. Audria Nine , who verbally acknowledged these results. Electronically Signed   By: Nelson Chimes M.D.   On:  07/23/2015 14:39   Ct Head Wo Contrast  07/23/2015  CLINICAL DATA:  LEFT side weakness, code stroke, facial droop, last seen normal at 1200 hours, history atrial fibrillation, hypertension, diabetes mellitus, prostate cancer, former smoker EXAM: CT HEAD WITHOUT CONTRAST TECHNIQUE: Contiguous axial images were obtained from the base of the skull through the vertex without intravenous contrast. COMPARISON:  None FINDINGS: Generalized atrophy. Normal ventricular morphology. No midline shift or mass effect. Extensive small vessel chronic ischemic changes of deep cerebral white matter. Question small old lacunar infarcts at the thalami and at the LEFT basal ganglia. Probable old lacunar infarct in RIGHT cerebellar hemisphere. Questionable small cortical infarct LEFT occipital lobe. No intracranial hemorrhage, mass lesion or evidence acute infarction. No extra-axial fluid collections. Bones demineralized. Visualized paranasal sinuses mastoid air cells show no significant abnormalities. Mild LEFT vertebral artery atherosclerotic calcification. IMPRESSION:  Atrophy with extensive small vessel chronic ischemic changes of deep cerebral white matter. Probable small old lacunar infarcts at RIGHT cerebellum, LEFT basal ganglia and thalami. Question small old LEFT occipital infarct. No acute intracranial abnormalities. Findings called on 07/23/2015 to Dr. Ralene Bathe at 1307 hrs and to Dr. Silverio Decamp at 1310 hrs. Electronically Signed   By: Lavonia Dana M.D.   On: 07/23/2015 13:12   Ct Angio Neck W/cm &/or Wo/cm  07/23/2015  CLINICAL DATA:  Right-sided gaze disturbance of left-sided weakness. EXAM: CT ANGIOGRAPHY HEAD AND NECK with CT perfusion TECHNIQUE: Multidetector CT imaging of the head and neck was performed using the standard protocol during bolus administration of intravenous contrast. Multiplanar CT image reconstructions and MIPs were obtained to evaluate the vascular anatomy. Carotid stenosis measurements (when applicable) are  obtained utilizing NASCET criteria, using the distal internal carotid diameter as the denominator. CONTRAST:  118mL OMNIPAQUE IOHEXOL 350 MG/ML SOLN COMPARISON:  Head CT earlier same day FINDINGS: CTA NECK Aortic arch: There is atherosclerosis of the aortic arch without aneurysm or dissection. 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 No origin stenosis. Right carotid system: Common carotid artery widely patent to the bifurcation. The carotid bifurcation is normal without stenosis or irregularity. Cervical internal carotid artery is normal. Left carotid system: Common carotid artery widely patent to the bifurcation. Normal appearing bifurcation without stenosis or irregularity. Cervical internal carotid artery is normal. Vertebral arteries:Vertebral artery origins are widely patent. Left vertebral artery arises directly from the arch as noted previously. Left vertebral artery is larger than the right. Both vertebral arteries are widely patent through the cervical region. Skeleton: Ordinary spondylosis and facet arthropathy Other neck: No mass or lymphadenopathy. Emphysema and scarring at the lung apices. CTA HEAD Anterior circulation: Both internal carotid arteries widely patent through the skullbase and siphon regions. No stenosis. The anterior and middle cerebral vessels are patent on the left without proximal stenosis, aneurysm or vascular malformation. No missing vessels are detected. On the right, there is occlusion or near occlusion of the MCA at the M1 M2 junction more distal vessels do show opacification, presumably from collaterals, though the vessels are smaller and less numerous. Posterior circulation: Both vertebral arteries patent through the foramen magnum to the basilar. No basilar stenosis. Posterior circulation branch vessels are normal. Venous sinuses: Patent and normal Anatomic variants: None significant Delayed phase: No abnormal  enhancement CT perfusion Delayed mean transit time, delayed cerebral blood flow and delayed time to peak throughout a large portion of the right MCA territory including the insula, posterior frontal and parietal region. Deep portions of this territory show or reduced blood volume suggesting core infarct, while the majority of the insular region and superficial brain shows normal cerebral blood volume suggesting penumbra. Basal ganglia perfuse normally. IMPRESSION: No disease demonstrated in the neck. 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 Some distal flow is reconstituted, though the vessels are smaller and less numerous. Perfusion suggests ischemia involving the majority of the right MCA territory with sparing of the basal ganglia. Deeper tissues appear to show core infarct with more superficial tissues representing penumbra. These results were called by telephone at the time of interpretation on 07/23/2015 at 2:36 pm to Dr. Audria Nine , who verbally acknowledged these results. Electronically Signed   By: Nelson Chimes M.D.   On: 07/23/2015 14:39   Ct Cerebral Perfusion W/cm  07/23/2015  CLINICAL DATA:  Right-sided gaze disturbance of left-sided weakness. EXAM: CT ANGIOGRAPHY HEAD AND NECK with CT perfusion TECHNIQUE: Multidetector CT imaging of the head and neck was performed using the standard protocol during bolus administration of intravenous contrast. Multiplanar CT image reconstructions and MIPs were obtained to evaluate the vascular anatomy. Carotid stenosis measurements (when applicable) are obtained utilizing NASCET criteria, using the distal internal carotid diameter as the denominator. CONTRAST:  156mL OMNIPAQUE IOHEXOL 350 MG/ML SOLN COMPARISON:  Head CT earlier same day FINDINGS: CTA NECK Aortic arch: There is atherosclerosis of the aortic arch without aneurysm or dissection. 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 No origin stenosis. Right carotid system: Common carotid artery widely patent to the bifurcation. The carotid bifurcation is normal without stenosis or irregularity. Cervical internal carotid artery is normal. Left carotid system: Common carotid artery widely patent to the bifurcation. Normal appearing bifurcation without stenosis or irregularity. Cervical internal carotid artery is normal. Vertebral arteries:Vertebral artery origins are widely patent. Left vertebral artery arises directly from the arch as noted previously. Left vertebral artery is larger than the right. Both vertebral arteries are widely patent through the cervical region. Skeleton: Ordinary spondylosis and facet arthropathy Other neck: No mass or lymphadenopathy. Emphysema and scarring at the lung apices. CTA HEAD Anterior circulation: Both internal carotid arteries widely patent through the skullbase and siphon regions. No stenosis. The anterior and middle cerebral vessels are patent on the left without proximal stenosis, aneurysm or vascular malformation. No missing vessels are detected. On the right, there is occlusion or near occlusion of the MCA at the M1 M2 junction more distal vessels do show opacification, presumably from collaterals, though the vessels are smaller and less numerous. Posterior circulation: Both vertebral arteries patent through the foramen magnum to the basilar. No basilar stenosis. Posterior circulation branch vessels are normal. Venous sinuses: Patent and normal Anatomic variants: None significant Delayed phase: No abnormal enhancement CT perfusion Delayed mean transit time, delayed cerebral blood flow and delayed time to peak throughout a large portion of the right MCA territory including the insula, posterior frontal and parietal region. Deep portions of this territory show or reduced blood volume suggesting core infarct, while the majority of the insular region and superficial brain shows normal  cerebral blood volume suggesting penumbra. Basal ganglia perfuse normally. IMPRESSION: No disease demonstrated in the neck. 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 Some distal flow is reconstituted, though the vessels are smaller and less numerous. Perfusion suggests ischemia involving the majority of the right MCA territory with sparing of the basal ganglia. Deeper tissues appear to show core infarct with more superficial tissues representing penumbra. These results were called by telephone at the time of interpretation on 07/23/2015 at 2:36 pm to Dr. Audria Nine , who verbally acknowledged these results. Electronically Signed   By: Nelson Chimes M.D.   On: 07/23/2015 14:39    EKG: Independently reviewed. Age not entered, assumed to be 81 years old for purpose of ECG interpretation Atrial fibrillation Anteroseptal infarct, age indeterminate  Assessment/Plan Principal Problem:   Stroke (cerebrum) [facility]) Active Problems:   Diabetes ([place])   Atrial fibrillation ([place])   PPM-[employer]   Anemia CKD III  #1. Stroke. Per CT angio of the head as noted above. Patient with history of A. fib on Xarelto. A weighted by neurology while in the emergency department. Neurology recommends medical admission and stroke workup. Per neurology  resume xarelto. -Admit to telemetry -We'll obtain chest x-ray -Will obtain echocardiogram -no MRI due to pacemaker -Lipid panel and hemoglobin A1c -Continue statin -resume xarelto -Nothing by mouth until he passes swallow eval -Speech therapy -Physical therapy  #2. A. Fib. CHADVasc score 4. Rate controlled. On Xarelto prior to event. Neurology recommends resuming. Home medications include Benicar HCT, Cardizem metoprolol. Blood pressure 170/99 -We'll hold antihypertensives for now -Monitor closely  #3. Diabetes. Diet controlled. Serum glucose 73 on admission -We'll obtain hemoglobin A 1C -Monitor serum glucose  #4. Chronic  kidney disease. Stage III. Likely related to HTN in the setting of diuretics. Creatinine on admission 1.51. Chart review indicates this is at baseline -Very gentle IV fluids -Hold nephrotoxins -Recheck in the morning -Monitor urine output  5. Anemia. Likely of chronic disease. Hemoglobin 10.8 on admission. Chart review indicates this is at baseline. Signs symptoms of active bleeding -Stable at baseline -Monitor  6. Hypertension. Home medications include Benicar HCTZ, Cardizem, metoprolol. Pressure 170/99 -We'll hold home medications for now to allow for permissive hypertension -Monitor closely and resume as indicated  #7. Bradycardia-TAchycardia syndrome. Status post pacemaker insertion. Chart review indicates no transmissions sent at the beginning of this month.   Neurology Dr Silverio Decamp  Code Status: full DVT Prophylaxis: Family Communication: grandson at bedside Disposition Plan: home hopefully 24 hours  Time spent: 56 minutes  [facility]

## 2015-07-23 NOTE — ED Notes (Signed)
Attempted to call report to 3S 

## 2015-07-23 NOTE — ED Provider Notes (Signed)
CSN: [id number]     Arrival date & time 07/23/15  1248 History   First MD Initiated Contact with Patient 07/23/15 1253     Chief Complaint  Patient presents with  . Code Stroke     The history is provided by the patient and the EMS personnel. No language interpreter was used.   [name] is a 82 y.o. male who presents to the Emergency Department complaining of stroke like sxs.  Level V caveat due to speech difficulties. Hx is provided by EMS.  At noon pt was eating lunch with two other pastors and had onset of left sided weakness and facial droop.  Per EMS his LUE weakness has improved en route to the hospital.  Sxs are severe in nature.     Past Medical History  Diagnosis Date  . Tachycardia-bradycardia syndrome ([place])   . Atrial fibrillation ([place])   . Hypertension   . Diabetes mellitus   . Hx SBO     Last 2013 all treated conservatively  . Ventral hernia     x3  . Prostate cancer ([place])   . CKD (chronic kidney disease)    Past Surgical History  Procedure Laterality Date  . Exploratory laparotomy    . Small intestine surgery  09/16/2004    SBO resection, VH repair  . Hemorrhoid surgery    . Hiatal hernia repair  2002  . Pacemaker insertion    . Cholecystectomy open  03/31/2001    Dr Lindon Romp  . Permanent pacemaker generator change N/A 04/06/2012    Procedure: PERMANENT PACEMAKER GENERATOR CHANGE;  Surgeon: Evans Lance, MD;  Location: [facility] CATH LAB;  Service: Cardiovascular;  Laterality: N/A;   Family History  Problem Relation Age of Onset  . Pneumonia Father   . Cancer Brother     type unknown   Social History  Substance Use Topics  . Smoking status: Former Smoker -- 1.50 packs/day    Quit date: 04/16/1955  . Smokeless tobacco: Former Systems developer    Types: Snuff, Sarina Ser    Quit date: 08/17/1952  . Alcohol Use: No    Review of Systems  Unable to perform ROS: Acuity of condition      Allergies  Review of patient's allergies indicates no known allergies.  Home  Medications   Prior to Admission medications   Medication Sig Start Date End Date Taking? Authorizing Provider  bisacodyl (BISACODYL) 5 MG EC tablet Take 5 mg by mouth daily as needed for moderate constipation.   Yes Historical Provider, MD  DM-Doxylamine-Acetaminophen (NYQUIL COLD & FLU PO) Take 10 mLs by mouth at bedtime as needed (for cold).   Yes Historical Provider, MD  DM-Doxylamine-Acetaminophen (NYQUIL COLD & FLU PO) Take 10 mLs by mouth at bedtime.   Yes Historical Provider, MD  olmesartan-hydrochlorothiazide (BENICAR HCT) 40-12.5 MG tablet Take 1 tablet by mouth daily.   Yes Historical Provider, MD  omeprazole (PRILOSEC) 20 MG capsule Take 20 mg by mouth daily as needed (for heartburn).   Yes Historical Provider, MD  OVER THE COUNTER MEDICATION Take 1 tablet by mouth daily. "appetitie boost" for supplement   Yes Historical Provider, MD  polycarbophil (FIBERCON) 625 MG tablet Take 625 mg by mouth daily as needed for moderate constipation.   Yes Historical Provider, MD  Pseudoeph-Doxylamine-DM-APAP (NYQUIL PO) Take 30 mLs by mouth at bedtime as needed (for sleep).   Yes Historical Provider, MD  rivaroxaban (XARELTO) 20 MG TABS tablet Take 20 mg by mouth daily with supper.  Yes Historical Provider, MD   BP 161/90 mmHg  Pulse 119  Temp(Src) 98.4 F (36.9 C) (Oral)  Resp 12  Ht 5\' 11"  (1.803 m)  Wt 158 lb 8 oz (71.895 kg)  BMI 22.12 kg/m2  SpO2 100% Physical Exam  Constitutional: He appears well-developed and well-nourished.  HENT:  Head: Normocephalic and atraumatic.  Cardiovascular: Normal rate and regular rhythm.   No murmur heard. Pulmonary/Chest: Effort normal and breath sounds normal. No respiratory distress.  Abdominal: Soft. There is no tenderness. There is no rebound and no guarding.  Musculoskeletal: He exhibits no edema or tenderness.  Neurological: He is alert.  Edited facial droop, left-sided neglect. Dysarthria. Mild left upper extremity weakness.  Skin: Skin is  warm and dry.  Psychiatric:  Unable to assess  Nursing note and vitals reviewed.   ED Course  Procedures (including critical care time) Labs Review Labs Reviewed  CBC - Abnormal; Notable for the following:    Hemoglobin 10.8 (*)    HCT 33.6 (*)    MCV 77.2 (*)    MCH 24.8 (*)    RDW 15.6 (*)    Platelets 149 (*)    All other components within normal limits  COMPREHENSIVE METABOLIC PANEL - Abnormal; Notable for the following:    Creatinine, Ser 1.51 (*)    Calcium 8.4 (*)    Total Protein 5.9 (*)    ALT 9 (*)    GFR calc non Af Amer 39 (*)    GFR calc Af Amer 45 (*)    All other components within normal limits  CBG MONITORING, ED - Abnormal; Notable for the following:    Glucose-Capillary 64 (*)    All other components within normal limits  I-STAT CHEM 8, ED - Abnormal; Notable for the following:    Creatinine, Ser 1.50 (*)    Calcium, Ion 1.11 (*)    Hemoglobin 12.9 (*)    HCT 38.0 (*)    All other components within normal limits  CBG MONITORING, ED - Abnormal; Notable for the following:    Glucose-Capillary 130 (*)    All other components within normal limits  MRSA PCR SCREENING  PROTIME-INR  APTT  DIFFERENTIAL  LIPID PANEL  GLUCOSE, CAPILLARY  GLUCOSE, CAPILLARY  GLUCOSE, CAPILLARY  HEMOGLOBIN A1C  I-STAT TROPOININ, ED    Imaging Review Ct Angio Head W/cm &/or Wo Cm  07/23/2015  CLINICAL DATA:  Right-sided gaze disturbance of left-sided weakness. EXAM: CT ANGIOGRAPHY HEAD AND NECK with CT perfusion TECHNIQUE: Multidetector CT imaging of the head and neck was performed using the standard protocol during bolus administration of intravenous contrast. Multiplanar CT image reconstructions and MIPs were obtained to evaluate the vascular anatomy. Carotid stenosis measurements (when applicable) are obtained utilizing NASCET criteria, using the distal internal carotid diameter as the denominator. CONTRAST:  146mL OMNIPAQUE IOHEXOL 350 MG/ML SOLN COMPARISON:  Head CT  earlier same day FINDINGS: CTA NECK Aortic arch: There is atherosclerosis of the aortic arch without aneurysm or dissection. 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 No origin stenosis. Right carotid system: Common carotid artery widely patent to the bifurcation. The carotid bifurcation is normal without stenosis or irregularity. Cervical internal carotid artery is normal. Left carotid system: Common carotid artery widely patent to the bifurcation. Normal appearing bifurcation without stenosis or irregularity. Cervical internal carotid artery is normal. Vertebral arteries:Vertebral artery origins are widely patent. Left vertebral artery arises directly from the arch as  noted previously. Left vertebral artery is larger than the right. Both vertebral arteries are widely patent through the cervical region. Skeleton: Ordinary spondylosis and facet arthropathy Other neck: No mass or lymphadenopathy. Emphysema and scarring at the lung apices. CTA HEAD Anterior circulation: Both internal carotid arteries widely patent through the skullbase and siphon regions. No stenosis. The anterior and middle cerebral vessels are patent on the left without proximal stenosis, aneurysm or vascular malformation. No missing vessels are detected. On the right, there is occlusion or near occlusion of the MCA at the M1 M2 junction more distal vessels do show opacification, presumably from collaterals, though the vessels are smaller and less numerous. Posterior circulation: Both vertebral arteries patent through the foramen magnum to the basilar. No basilar stenosis. Posterior circulation branch vessels are normal. Venous sinuses: Patent and normal Anatomic variants: None significant Delayed phase: No abnormal enhancement CT perfusion Delayed mean transit time, delayed cerebral blood flow and delayed time to peak throughout a large portion of the right MCA territory including the  insula, posterior frontal and parietal region. Deep portions of this territory show or reduced blood volume suggesting core infarct, while the majority of the insular region and superficial brain shows normal cerebral blood volume suggesting penumbra. Basal ganglia perfuse normally. IMPRESSION: No disease demonstrated in the neck. 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 Some distal flow is reconstituted, though the vessels are smaller and less numerous. Perfusion suggests ischemia involving the majority of the right MCA territory with sparing of the basal ganglia. Deeper tissues appear to show core infarct with more superficial tissues representing penumbra. These results were called by telephone at the time of interpretation on 07/23/2015 at 2:36 pm to Dr. Audria Nine , who verbally acknowledged these results. Electronically Signed   By: Nelson Chimes M.D.   On: 07/23/2015 14:39   Dg Chest 2 View  07/23/2015  CLINICAL DATA:  Left facial droop and weakness today at church. EXAM: CHEST  2 VIEW COMPARISON:  03/05/2013. FINDINGS: The right atrial and right ventricular wires are stable. Stable mild cardiac enlargement. There is tortuosity of the thoracic aorta. The lungs are clear of an acute process. Mild chronic bronchitic changes. No pleural effusion. The bony thorax is grossly intact. Bilateral nipple shadows are noted. IMPRESSION: No acute cardiopulmonary findings. Electronically Signed   By: Marijo Sanes M.D.   On: 07/23/2015 16:59   Ct Head Wo Contrast  07/23/2015  CLINICAL DATA:  LEFT side weakness, code stroke, facial droop, last seen normal at 1200 hours, history atrial fibrillation, hypertension, diabetes mellitus, prostate cancer, former smoker EXAM: CT HEAD WITHOUT CONTRAST TECHNIQUE: Contiguous axial images were obtained from the base of the skull through the vertex without intravenous contrast. COMPARISON:  None FINDINGS: Generalized atrophy. Normal  ventricular morphology. No midline shift or mass effect. Extensive small vessel chronic ischemic changes of deep cerebral white matter. Question small old lacunar infarcts at the thalami and at the LEFT basal ganglia. Probable old lacunar infarct in RIGHT cerebellar hemisphere. Questionable small cortical infarct LEFT occipital lobe. No intracranial hemorrhage, mass lesion or evidence acute infarction. No extra-axial fluid collections. Bones demineralized. Visualized paranasal sinuses mastoid air cells show no significant abnormalities. Mild LEFT vertebral artery atherosclerotic calcification. IMPRESSION: Atrophy with extensive small vessel chronic ischemic changes of deep cerebral white matter. Probable small old lacunar infarcts at RIGHT cerebellum, LEFT basal ganglia and thalami. Question small old LEFT occipital infarct. No acute intracranial abnormalities. Findings called  on 07/23/2015 to Dr. Ralene Bathe at 1307 hrs and to Dr. Silverio Decamp at 1310 hrs. Electronically Signed   By: Lavonia Dana M.D.   On: 07/23/2015 13:12   Ct Angio Neck W/cm &/or Wo/cm  07/23/2015  CLINICAL DATA:  Right-sided gaze disturbance of left-sided weakness. EXAM: CT ANGIOGRAPHY HEAD AND NECK with CT perfusion TECHNIQUE: Multidetector CT imaging of the head and neck was performed using the standard protocol during bolus administration of intravenous contrast. Multiplanar CT image reconstructions and MIPs were obtained to evaluate the vascular anatomy. Carotid stenosis measurements (when applicable) are obtained utilizing NASCET criteria, using the distal internal carotid diameter as the denominator. CONTRAST:  119mL OMNIPAQUE IOHEXOL 350 MG/ML SOLN COMPARISON:  Head CT earlier same day FINDINGS: CTA NECK Aortic arch: There is atherosclerosis of the aortic arch without aneurysm or dissection. 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 No origin stenosis. Right carotid system: Common carotid  artery widely patent to the bifurcation. The carotid bifurcation is normal without stenosis or irregularity. Cervical internal carotid artery is normal. Left carotid system: Common carotid artery widely patent to the bifurcation. Normal appearing bifurcation without stenosis or irregularity. Cervical internal carotid artery is normal. Vertebral arteries:Vertebral artery origins are widely patent. Left vertebral artery arises directly from the arch as noted previously. Left vertebral artery is larger than the right. Both vertebral arteries are widely patent through the cervical region. Skeleton: Ordinary spondylosis and facet arthropathy Other neck: No mass or lymphadenopathy. Emphysema and scarring at the lung apices. CTA HEAD Anterior circulation: Both internal carotid arteries widely patent through the skullbase and siphon regions. No stenosis. The anterior and middle cerebral vessels are patent on the left without proximal stenosis, aneurysm or vascular malformation. No missing vessels are detected. On the right, there is occlusion or near occlusion of the MCA at the M1 M2 junction more distal vessels do show opacification, presumably from collaterals, though the vessels are smaller and less numerous. Posterior circulation: Both vertebral arteries patent through the foramen magnum to the basilar. No basilar stenosis. Posterior circulation branch vessels are normal. Venous sinuses: Patent and normal Anatomic variants: None significant Delayed phase: No abnormal enhancement CT perfusion Delayed mean transit time, delayed cerebral blood flow and delayed time to peak throughout a large portion of the right MCA territory including the insula, posterior frontal and parietal region. Deep portions of this territory show or reduced blood volume suggesting core infarct, while the majority of the insular region and superficial brain shows normal cerebral blood volume suggesting penumbra. Basal ganglia perfuse normally.  IMPRESSION: No disease demonstrated in the neck. 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 Some distal flow is reconstituted, though the vessels are smaller and less numerous. Perfusion suggests ischemia involving the majority of the right MCA territory with sparing of the basal ganglia. Deeper tissues appear to show core infarct with more superficial tissues representing penumbra. These results were called by telephone at the time of interpretation on 07/23/2015 at 2:36 pm to Dr. Audria Nine , who verbally acknowledged these results. Electronically Signed   By: Nelson Chimes M.D.   On: 07/23/2015 14:39   Ct Cerebral Perfusion W/cm  07/23/2015  CLINICAL DATA:  Right-sided gaze disturbance of left-sided weakness. EXAM: CT ANGIOGRAPHY HEAD AND NECK with CT perfusion TECHNIQUE: Multidetector CT imaging of the head and neck was performed using the standard protocol during bolus administration of intravenous contrast. Multiplanar  CT image reconstructions and MIPs were obtained to evaluate the vascular anatomy. Carotid stenosis measurements (when applicable) are obtained utilizing NASCET criteria, using the distal internal carotid diameter as the denominator. CONTRAST:  161mL OMNIPAQUE IOHEXOL 350 MG/ML SOLN COMPARISON:  Head CT earlier same day FINDINGS: CTA NECK Aortic arch: There is atherosclerosis of the aortic arch without aneurysm or dissection. 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 No origin stenosis. Right carotid system: Common carotid artery widely patent to the bifurcation. The carotid bifurcation is normal without stenosis or irregularity. Cervical internal carotid artery is normal. Left carotid system: Common carotid artery widely patent to the bifurcation. Normal appearing bifurcation without stenosis or irregularity. Cervical internal carotid artery is normal. Vertebral arteries:Vertebral artery origins  are widely patent. Left vertebral artery arises directly from the arch as noted previously. Left vertebral artery is larger than the right. Both vertebral arteries are widely patent through the cervical region. Skeleton: Ordinary spondylosis and facet arthropathy Other neck: No mass or lymphadenopathy. Emphysema and scarring at the lung apices. CTA HEAD Anterior circulation: Both internal carotid arteries widely patent through the skullbase and siphon regions. No stenosis. The anterior and middle cerebral vessels are patent on the left without proximal stenosis, aneurysm or vascular malformation. No missing vessels are detected. On the right, there is occlusion or near occlusion of the MCA at the M1 M2 junction more distal vessels do show opacification, presumably from collaterals, though the vessels are smaller and less numerous. Posterior circulation: Both vertebral arteries patent through the foramen magnum to the basilar. No basilar stenosis. Posterior circulation branch vessels are normal. Venous sinuses: Patent and normal Anatomic variants: None significant Delayed phase: No abnormal enhancement CT perfusion Delayed mean transit time, delayed cerebral blood flow and delayed time to peak throughout a large portion of the right MCA territory including the insula, posterior frontal and parietal region. Deep portions of this territory show or reduced blood volume suggesting core infarct, while the majority of the insular region and superficial brain shows normal cerebral blood volume suggesting penumbra. Basal ganglia perfuse normally. IMPRESSION: No disease demonstrated in the neck. 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 Some distal flow is reconstituted, though the vessels are smaller and less numerous. Perfusion suggests ischemia involving the majority of the right MCA territory with sparing of the basal ganglia. Deeper tissues appear to show core infarct with more  superficial tissues representing penumbra. These results were called by telephone at the time of interpretation on 07/23/2015 at 2:36 pm to Dr. Audria Nine , who verbally acknowledged these results. Electronically Signed   By: Nelson Chimes M.D.   On: 07/23/2015 14:39   I have personally reviewed and evaluated these images and lab results as part of my medical decision-making.   EKG Interpretation   Date/Time:  Sunday July 23 2015 13:32:44 EST Ventricular Rate:  85 PR Interval:    QRS Duration: 93 QT Interval:  409 QTC Calculation: 486 R Axis:   36 Text Interpretation:  Age not entered, assumed to be  82 years old for  purpose of ECG interpretation Atrial fibrillation Anteroseptal infarct,  age indeterminate Lateral leads are also involved ED PHYSICIAN  INTERPRETATION AVAILABLE IN [place] Confirmed by TEST, Record  ([id number]) on 07/24/2015 6:38:01 AM      MDM   Final diagnoses:  Acute CVA (cerebrovascular accident) [facility])    Patient here for evaluation  of left-sided weakness, code stroke activated prior to ED arrival by EMS. On ED presentation patient with exam concerning for acute cerebrovascular accident event. He was evaluated by neurology in the emergency department. Patient is currently taking is a relative for atrial fibrillation so thrombolytics were withheld. During the patient's emergency department stay his symptoms continue to improve. Decision was made to not go to interventional radiology for thrombectomy after discussions between family and neurology. Patient admitted to hospitalists for further treatment. He did have some hypoglycemia in the department that was treated with half an amp of D50.  Quintella Reichert, MD 07/24/15 [phone]

## 2015-07-23 NOTE — ED Notes (Signed)
Per Neurologist pt to not go to IR d/t symptoms improving, pt to be admitted by hospitalist to neuro stepdown

## 2015-07-23 NOTE — Consult Note (Signed)
Requesting Physician: Dr.  Ralene Bathe    Reason for consultation: Stroke code  HPI:                                                                                                                                         [name] is an 82 y.o. male patient who was brought into the emergency room  via EMS with symptoms of severe dysarthria, right gaze deviation and left hemiplegia. Patient was at church when symptoms started. Time of onset was reportedly at 12 PM , witnessed by other people at church .   patient unable to provide history during the initial evaluation.   His level of alertness, and dysarthria improved gradually with time values in the ER. Then he was able to provide more history. He reported taking his last dose of Xarelto David before yesterday at 6 PM, he missed only one dose of Xarelto yesterday.   Date last known well:  07/23/15 Time last known well:  12 pm tPA Given: No: on xeralto  Stroke Risk Factors - atrial fibrillation  Past Medical History: Past Medical History  Diagnosis Date  . Tachycardia-bradycardia syndrome   . Atrial fibrillation   . Hypertension   . Diabetes mellitus   . Hx SBO     Last 2013 all treated conservatively  . Ventral hernia     x3  . Prostate cancer     Past Surgical History  Procedure Laterality Date  . Exploratory laparotomy    . Small intestine surgery  09/16/2004    SBO resection, VH repair  . Hemorrhoid surgery    . Hiatal hernia repair  2002  . Pacemaker insertion    . Cholecystectomy open  03/31/2001    Dr Lindon Romp  . Permanent pacemaker generator change N/A 04/06/2012    Procedure: PERMANENT PACEMAKER GENERATOR CHANGE;  Surgeon: Evans Lance, MD;  Location: [facility] CATH LAB;  Service: Cardiovascular;  Laterality: N/A;    Family History: Family History  Problem Relation Age of Onset  . Pneumonia Father   . Cancer Brother     type unknown    Social History:   reports that he quit smoking about 60 years ago. He quit  smokeless tobacco use about 62 years ago. His smokeless tobacco use included Snuff and Chew. He reports that he does not drink alcohol or use illicit drugs.  Allergies:  No Known Allergies   Medications:  No current facility-administered medications for this encounter.  Current outpatient prescriptions:  .  Pseudoeph-Doxylamine-DM-APAP (NYQUIL PO), Take 30 mLs by mouth at bedtime as needed (for sleep)., Disp: , Rfl:  .  diltiazem (CARDIZEM LA) 240 MG 24 hr tablet, Take 240 mg by mouth at bedtime. , Disp: , Rfl:  .  metoprolol tartrate (LOPRESSOR) 25 MG tablet, Take 25 mg by mouth 2 (two) times daily. , Disp: , Rfl:  .  ondansetron (ZOFRAN) 4 MG tablet, Take 1 tablet (4 mg total) by mouth every 6 (six) hours as needed for nausea., Disp: 20 tablet, Rfl: 0 .  pseudoephedrine-acetaminophen (TYLENOL SINUS) 30-500 MG TABS, Take 1 tablet by mouth every 4 (four) hours as needed (congestion/headache)., Disp: , Rfl:    ROS:                                                                                                                                       History obtained from the patient  General ROS: negative for - chills, fatigue, fever, night sweats, weight gain or weight loss Psychological ROS: negative for - behavioral disorder, hallucinations, memory difficulties, mood swings or suicidal ideation Ophthalmic ROS: negative for - blurry vision, double vision, eye pain or loss of vision ENT ROS: negative for - epistaxis, nasal discharge, oral lesions, sore throat, tinnitus or vertigo Allergy and Immunology ROS: negative for - hives or itchy/watery eyes Hematological and Lymphatic ROS: negative for - bleeding problems, bruising or swollen lymph nodes Endocrine ROS: negative for - galactorrhea, hair pattern changes, polydipsia/polyuria or temperature intolerance Respiratory ROS:  negative for - cough, hemoptysis, shortness of breath or wheezing Cardiovascular ROS: negative for - chest pain, dyspnea on exertion, edema or irregular heartbeat Gastrointestinal ROS: negative for - abdominal pain, diarrhea, hematemesis, nausea/vomiting or stool incontinence Genito-Urinary ROS: negative for - dysuria, hematuria, incontinence or urinary frequency/urgency Musculoskeletal ROS: negative for - joint swelling or muscular weakness Neurological ROS: as noted in HPI Dermatological ROS: negative for rash and skin lesion changes  Neurologic Examination:                                                                                                      Blood pressure 161/98, pulse 72, resp. rate 18, weight 75.8 kg (167 lb 1.7 oz), SpO2 100 %.  Evaluation of higher integrative functions including: Level of alertness: drowsy, left neglect, which progressively improved while he was in the ER Oriented to time, place and person Speech:   moderate to  severe dysarthria Dysarthria improved to mild dysarthria , no evidence of aphasia noted.  Test the following cranial nerves: forced right gaze deviation which improved to full EOM, Left facial weakness, likely UMN. Rest of the CN intact .  Motor examination: Normal tone, bulk, left facial weakness noted UMN, full 5/5 motor strength in all 4 extremities, no  Limb drift  Examination of sensation :unreliable, reports  symmetric sensation to pinprick in all 4 extremities and on face Examination of deep tendon reflexes: 1+, symmetric in all extremities, normal plantars bilaterally Test coordination: Normal finger nose testing, with no evidence of limb appendicular ataxia or abnormal involuntary movements or tremors noted.  Gait: Deferred   Lab Results: Basic Metabolic Panel:  Recent Labs Lab 07/23/15 1254 07/23/15 1300  NA 140 142  K 4.2 4.1  CL 106 104  CO2 24  --   GLUCOSE 75 73  BUN 11 13  CREATININE 1.51* 1.50*  CALCIUM 8.4*  --      Liver Function Tests:  Recent Labs Lab 07/23/15 1254  AST 22  ALT 9*  ALKPHOS 75  BILITOT 0.4  PROT 5.9*  ALBUMIN 3.5   No results for input(s): LIPASE, AMYLASE in the last 168 hours. No results for input(s): AMMONIA in the last 168 hours.  CBC:  Recent Labs Lab 07/23/15 1254 07/23/15 1300  WBC 6.9  --   NEUTROABS 4.4  --   HGB 10.8* 12.9*  HCT 33.6* 38.0*  MCV 77.2*  --   PLT 149*  --     Cardiac Enzymes: No results for input(s): CKTOTAL, CKMB, CKMBINDEX, TROPONINI in the last 168 hours.  Lipid Panel: No results for input(s): CHOL, TRIG, HDL, CHOLHDL, VLDL, LDLCALC in the last 168 hours.  CBG: No results for input(s): GLUCAP in the last 168 hours.  Microbiology: Results for orders placed or performed during the hospital encounter of 04/06/12  Surgical pcr screen     Status: None   Collection Time: 04/06/12  8:19 AM  Result Value Ref Range Status   MRSA, PCR NEGATIVE NEGATIVE Final   Staphylococcus aureus NEGATIVE NEGATIVE Final    Comment:        The Xpert SA Assay (FDA approved for NASAL specimens in patients over 38 years of age), is one component of a comprehensive surveillance program.  Test performance has been validated by [employer] for patients greater than or equal to 57 year old. It is not intended to diagnose infection nor to guide or monitor treatment.     Imaging: Ct Angio Head W/cm &/or Wo Cm  07/23/2015  CLINICAL DATA:  Right-sided gaze disturbance of left-sided weakness. EXAM: CT ANGIOGRAPHY HEAD AND NECK with CT perfusion TECHNIQUE: Multidetector CT imaging of the head and neck was performed using the standard protocol during bolus administration of intravenous contrast. Multiplanar CT image reconstructions and MIPs were obtained to evaluate the vascular anatomy. Carotid stenosis measurements (when applicable) are obtained utilizing NASCET criteria, using the distal internal carotid diameter as the denominator. CONTRAST:  122mL  OMNIPAQUE IOHEXOL 350 MG/ML SOLN COMPARISON:  Head CT earlier same day FINDINGS: CTA NECK Aortic arch: There is atherosclerosis of the aortic arch without aneurysm or dissection. 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 No origin stenosis. Right carotid system: Common carotid artery widely patent to the bifurcation. The carotid bifurcation is normal without stenosis or irregularity. Cervical internal carotid artery is normal. Left carotid system: Common carotid artery  widely patent to the bifurcation. Normal appearing bifurcation without stenosis or irregularity. Cervical internal carotid artery is normal. Vertebral arteries:Vertebral artery origins are widely patent. Left vertebral artery arises directly from the arch as noted previously. Left vertebral artery is larger than the right. Both vertebral arteries are widely patent through the cervical region. Skeleton: Ordinary spondylosis and facet arthropathy Other neck: No mass or lymphadenopathy. Emphysema and scarring at the lung apices. CTA HEAD Anterior circulation: Both internal carotid arteries widely patent through the skullbase and siphon regions. No stenosis. The anterior and middle cerebral vessels are patent on the left without proximal stenosis, aneurysm or vascular malformation. No missing vessels are detected. On the right, there is occlusion or near occlusion of the MCA at the M1 M2 junction more distal vessels do show opacification, presumably from collaterals, though the vessels are smaller and less numerous. Posterior circulation: Both vertebral arteries patent through the foramen magnum to the basilar. No basilar stenosis. Posterior circulation branch vessels are normal. Venous sinuses: Patent and normal Anatomic variants: None significant Delayed phase: No abnormal enhancement CT perfusion Delayed mean transit time, delayed cerebral blood flow and delayed time to peak throughout a large  portion of the right MCA territory including the insula, posterior frontal and parietal region. Deep portions of this territory show or reduced blood volume suggesting core infarct, while the majority of the insular region and superficial brain shows normal cerebral blood volume suggesting penumbra. Basal ganglia perfuse normally. IMPRESSION: No disease demonstrated in the neck. 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 Some distal flow is reconstituted, though the vessels are smaller and less numerous. Perfusion suggests ischemia involving the majority of the right MCA territory with sparing of the basal ganglia. Deeper tissues appear to show core infarct with more superficial tissues representing penumbra. These results were called by telephone at the time of interpretation on 07/23/2015 at 2:36 pm to Dr. Audria Nine , who verbally acknowledged these results. Electronically Signed   By: Nelson Chimes M.D.   On: 07/23/2015 14:39   Ct Head Wo Contrast  07/23/2015  CLINICAL DATA:  LEFT side weakness, code stroke, facial droop, last seen normal at 1200 hours, history atrial fibrillation, hypertension, diabetes mellitus, prostate cancer, former smoker EXAM: CT HEAD WITHOUT CONTRAST TECHNIQUE: Contiguous axial images were obtained from the base of the skull through the vertex without intravenous contrast. COMPARISON:  None FINDINGS: Generalized atrophy. Normal ventricular morphology. No midline shift or mass effect. Extensive small vessel chronic ischemic changes of deep cerebral white matter. Question small old lacunar infarcts at the thalami and at the LEFT basal ganglia. Probable old lacunar infarct in RIGHT cerebellar hemisphere. Questionable small cortical infarct LEFT occipital lobe. No intracranial hemorrhage, mass lesion or evidence acute infarction. No extra-axial fluid collections. Bones demineralized. Visualized paranasal sinuses mastoid air cells show no significant  abnormalities. Mild LEFT vertebral artery atherosclerotic calcification. IMPRESSION: Atrophy with extensive small vessel chronic ischemic changes of deep cerebral white matter. Probable small old lacunar infarcts at RIGHT cerebellum, LEFT basal ganglia and thalami. Question small old LEFT occipital infarct. No acute intracranial abnormalities. Findings called on 07/23/2015 to Dr. Ralene Bathe at 1307 hrs and to Dr. Silverio Decamp at 1310 hrs. Electronically Signed   By: Lavonia Dana M.D.   On: 07/23/2015 13:12   Ct Angio Neck W/cm &/or Wo/cm  07/23/2015  CLINICAL DATA:  Right-sided gaze disturbance of left-sided weakness. EXAM: CT ANGIOGRAPHY HEAD AND NECK with CT perfusion TECHNIQUE: Multidetector  CT imaging of the head and neck was performed using the standard protocol during bolus administration of intravenous contrast. Multiplanar CT image reconstructions and MIPs were obtained to evaluate the vascular anatomy. Carotid stenosis measurements (when applicable) are obtained utilizing NASCET criteria, using the distal internal carotid diameter as the denominator. CONTRAST:  11mL OMNIPAQUE IOHEXOL 350 MG/ML SOLN COMPARISON:  Head CT earlier same day FINDINGS: CTA NECK Aortic arch: There is atherosclerosis of the aortic arch without aneurysm or dissection. 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 No origin stenosis. Right carotid system: Common carotid artery widely patent to the bifurcation. The carotid bifurcation is normal without stenosis or irregularity. Cervical internal carotid artery is normal. Left carotid system: Common carotid artery widely patent to the bifurcation. Normal appearing bifurcation without stenosis or irregularity. Cervical internal carotid artery is normal. Vertebral arteries:Vertebral artery origins are widely patent. Left vertebral artery arises directly from the arch as noted previously. Left vertebral artery is larger than the right. Both  vertebral arteries are widely patent through the cervical region. Skeleton: Ordinary spondylosis and facet arthropathy Other neck: No mass or lymphadenopathy. Emphysema and scarring at the lung apices. CTA HEAD Anterior circulation: Both internal carotid arteries widely patent through the skullbase and siphon regions. No stenosis. The anterior and middle cerebral vessels are patent on the left without proximal stenosis, aneurysm or vascular malformation. No missing vessels are detected. On the right, there is occlusion or near occlusion of the MCA at the M1 M2 junction more distal vessels do show opacification, presumably from collaterals, though the vessels are smaller and less numerous. Posterior circulation: Both vertebral arteries patent through the foramen magnum to the basilar. No basilar stenosis. Posterior circulation branch vessels are normal. Venous sinuses: Patent and normal Anatomic variants: None significant Delayed phase: No abnormal enhancement CT perfusion Delayed mean transit time, delayed cerebral blood flow and delayed time to peak throughout a large portion of the right MCA territory including the insula, posterior frontal and parietal region. Deep portions of this territory show or reduced blood volume suggesting core infarct, while the majority of the insular region and superficial brain shows normal cerebral blood volume suggesting penumbra. Basal ganglia perfuse normally. IMPRESSION: No disease demonstrated in the neck. 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 Some distal flow is reconstituted, though the vessels are smaller and less numerous. Perfusion suggests ischemia involving the majority of the right MCA territory with sparing of the basal ganglia. Deeper tissues appear to show core infarct with more superficial tissues representing penumbra. These results were called by telephone at the time of interpretation on 07/23/2015 at 2:36 pm to Dr. Audria Nine , who verbally acknowledged these results. Electronically Signed   By: Nelson Chimes M.D.   On: 07/23/2015 14:39   Ct Cerebral Perfusion W/cm  07/23/2015  CLINICAL DATA:  Right-sided gaze disturbance of left-sided weakness. EXAM: CT ANGIOGRAPHY HEAD AND NECK with CT perfusion TECHNIQUE: Multidetector CT imaging of the head and neck was performed using the standard protocol during bolus administration of intravenous contrast. Multiplanar CT image reconstructions and MIPs were obtained to evaluate the vascular anatomy. Carotid stenosis measurements (when applicable) are obtained utilizing NASCET criteria, using the distal internal carotid diameter as the denominator. CONTRAST:  159mL OMNIPAQUE IOHEXOL 350 MG/ML SOLN COMPARISON:  Head CT earlier same day FINDINGS: CTA NECK Aortic arch: There is atherosclerosis of the aortic arch without aneurysm  or dissection. 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 No origin stenosis. Right carotid system: Common carotid artery widely patent to the bifurcation. The carotid bifurcation is normal without stenosis or irregularity. Cervical internal carotid artery is normal. Left carotid system: Common carotid artery widely patent to the bifurcation. Normal appearing bifurcation without stenosis or irregularity. Cervical internal carotid artery is normal. Vertebral arteries:Vertebral artery origins are widely patent. Left vertebral artery arises directly from the arch as noted previously. Left vertebral artery is larger than the right. Both vertebral arteries are widely patent through the cervical region. Skeleton: Ordinary spondylosis and facet arthropathy Other neck: No mass or lymphadenopathy. Emphysema and scarring at the lung apices. CTA HEAD Anterior circulation: Both internal carotid arteries widely patent through the skullbase and siphon regions. No stenosis. The anterior and middle cerebral vessels are patent  on the left without proximal stenosis, aneurysm or vascular malformation. No missing vessels are detected. On the right, there is occlusion or near occlusion of the MCA at the M1 M2 junction more distal vessels do show opacification, presumably from collaterals, though the vessels are smaller and less numerous. Posterior circulation: Both vertebral arteries patent through the foramen magnum to the basilar. No basilar stenosis. Posterior circulation branch vessels are normal. Venous sinuses: Patent and normal Anatomic variants: None significant Delayed phase: No abnormal enhancement CT perfusion Delayed mean transit time, delayed cerebral blood flow and delayed time to peak throughout a large portion of the right MCA territory including the insula, posterior frontal and parietal region. Deep portions of this territory show or reduced blood volume suggesting core infarct, while the majority of the insular region and superficial brain shows normal cerebral blood volume suggesting penumbra. Basal ganglia perfuse normally. IMPRESSION: No disease demonstrated in the neck. 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 Some distal flow is reconstituted, though the vessels are smaller and less numerous. Perfusion suggests ischemia involving the majority of the right MCA territory with sparing of the basal ganglia. Deeper tissues appear to show core infarct with more superficial tissues representing penumbra. These results were called by telephone at the time of interpretation on 07/23/2015 at 2:36 pm to Dr. Audria Nine , who verbally acknowledged these results. Electronically Signed   By: Nelson Chimes M.D.   On: 07/23/2015 14:39    Assessment and plan:   [name] is an 82 y.o. male patient who presented with acute onset of moderate to severe dysarthria, right gaze deviation, left hemiplegia when the EMS initially saw him in the church . By the time he was evaluated in the ER,  he continued to have right gaze deviation with left-sided neglect although his motor symptoms have improved and initially showed some mild left-sided drift, and within a few minutes improved to full motor strength in bilateral upper and lower studies. He is not considered a candidate for IV TPA since he is on Xarelto , and his last dose was about 42 hours ago. A stat CT angiogram of the head and neck with perfusion study were obtained. No acute thrombus in the right M2 is noted with perfusion deficit with prolonged MTT involving the right MCA vascular territory. Clinically patient continued to gradually improve with improvement of the left facial weakness, neglect, gaze deviation resolved,  and his dysarthria also gradually improved. After the CT angiogram study with done, I discussed with the patient and family about possible  mechanical thrombectomy. As patient continued to improve, given the risks associated with thrombectomy procedure and the requirement for intubation and mechanical ventilation at his age, with minimal residual symptoms at this time, do not recommend any further intervention at this time. Discussed with family, and they agreed same. Also discussed with Dr. Elroy Channel, who agreed to hold off on any further intervention based on the significant clinical improvement .   his blood pressures were stable and [id number] systolic, on IV fluids. Ordered rectal aspirin 300 mg one dose in the ER.   He'll be admitted to the hospitalist service in a stepdown unit for close neurological monitoring. Check A1c, lipid profile .  Check transthoracic echocardiogram, may resume Xarelto 20 mg daily home medication. Patient has a pacemaker, since no MRI.  Physical therapy, speech therapy and occupational therapy evaluation.   Neurology stroke team will continue to follow starting tomorrow morning

## 2015-07-24 ENCOUNTER — Inpatient Hospital Stay (HOSPITAL_COMMUNITY): Payer: Medicare Other

## 2015-07-24 DIAGNOSIS — I639 Cerebral infarction, unspecified: Secondary | ICD-10-CM

## 2015-07-24 DIAGNOSIS — I6789 Other cerebrovascular disease: Secondary | ICD-10-CM

## 2015-07-24 DIAGNOSIS — I4891 Unspecified atrial fibrillation: Secondary | ICD-10-CM

## 2015-07-24 LAB — GLUCOSE, CAPILLARY
GLUCOSE-CAPILLARY: 93 mg/dL (ref 65–99)
GLUCOSE-CAPILLARY: 188 mg/dL — AB (ref 65–99)
GLUCOSE-CAPILLARY: 187 mg/dL — AB (ref 65–99)
Glucose-Capillary: 86 mg/dL (ref 65–99)
Glucose-Capillary: 89 mg/dL (ref 65–99)
Glucose-Capillary: 91 mg/dL (ref 65–99)

## 2015-07-24 LAB — HEMOGLOBIN A1C
Hgb A1c MFr Bld: 6.5 % — ABNORMAL HIGH (ref 4.8–5.6)
Mean Plasma Glucose: 140 mg/dL

## 2015-07-24 MED ORDER — SIMETHICONE 80 MG PO CHEW: 80.0000 mg | CHEWABLE_TABLET | Freq: Four times a day (QID) | ORAL | Status: DC | PRN

## 2015-07-24 MED ORDER — BISACODYL 5 MG PO TBEC: 5.0000 mg | DELAYED_RELEASE_TABLET | Freq: Every day | ORAL | Status: DC | PRN

## 2015-07-24 MED ORDER — STROKE: EARLY STAGES OF RECOVERY BOOK
Freq: Once | Status: AC
  Administered 2015-07-24: 06:00:00
  Filled 2015-07-24: qty 1

## 2015-07-24 MED ORDER — INSULIN ASPART 100 UNIT/ML ~~LOC~~ SOLN
0.0000 [IU] | Freq: Three times a day (TID) | SUBCUTANEOUS | Status: DC
Start: 2015-07-24 — End: 2015-07-25
  Administered 2015-07-24: 2 [IU] via SUBCUTANEOUS

## 2015-07-24 MED ORDER — PANTOPRAZOLE SODIUM 40 MG PO TBEC
40.0000 mg | DELAYED_RELEASE_TABLET | Freq: Every day | ORAL | Status: DC
  Administered 2015-07-24 – 2015-07-25 (×2): 40 mg via ORAL
  Filled 2015-07-24 (×2): qty 1

## 2015-07-24 MED ORDER — RIVAROXABAN 15 MG PO TABS
15.0000 mg | ORAL_TABLET | Freq: Every day | ORAL | Status: DC
  Administered 2015-07-24: 15 mg via ORAL
  Filled 2015-07-24: qty 1

## 2015-07-24 NOTE — Progress Notes (Signed)
STROKE TEAM PROGRESS NOTE   HISTORY OF PRESENT ILLNESS [name] is an 82 y.o. male patient who was brought into the emergency room via EMS with symptoms of severe dysarthria, right gaze deviation and left hemiplegia. Patient was at church when symptoms started. Time of onset was reportedly at 12 PM , witnessed by other people at church. Patient unable to provide history during the initial evaluation (LKW 07/23/2015 at 12 noon).  His level of alertness, and dysarthria improved gradually with time values in the ER. Then he was able to provide more history. He reported taking his last dose of Xarelto David before yesterday at 6 PM, he missed only one dose of Xarelto yesterday. Patient was not administered IV t-PA secondary to being on xarelto (last dose 42 hours ago). CT, EEG showed clot right M1, but given significant clinical improvement in risk of procedure, after discussion with family and Dr. Rowe Robert was made to old off on neuro intervention.  He was admitted for further evaluation and treatment.   SUBJECTIVE (INTERVAL HISTORY) No family is at the bedside.  Patient up in chair at the bedside. States he has been taking samples from his doctor, that is why he ran out. Overall he feels his condition is stable.    OBJECTIVE Temp:  [97.7 F (36.5 C)-98.5 F (36.9 C)] 98.4 F (36.9 C) (03/06 0721) Pulse Rate:  [62-119] 119 (03/06 0721) Cardiac Rhythm:  [-] Atrial fibrillation (03/06 0750) Resp:  [10-19] 12 (03/06 0721) BP: (142-178)/(82-111) 161/90 mmHg (03/06 0721) SpO2:  [97 %-100 %] 100 % (03/06 0721) Weight:  [71.895 kg (158 lb 8 oz)-75.8 kg (167 lb 1.7 oz)] 71.895 kg (158 lb 8 oz) (03/05 1800)  CBC:   Recent Labs Lab 07/23/15 1254 07/23/15 1300  WBC 6.9  --   NEUTROABS 4.4  --   HGB 10.8* 12.9*  HCT 33.6* 38.0*  MCV 77.2*  --   PLT 149*  --     Basic Metabolic Panel:   Recent Labs Lab 07/23/15 1254 07/23/15 1300  NA 140 142  K 4.2 4.1  CL 106 104  CO2  24  --   GLUCOSE 75 73  BUN 11 13  CREATININE 1.51* 1.50*  CALCIUM 8.4*  --     Lipid Panel:     Component Value Date/Time   CHOL 122 07/23/2015 1254   TRIG 55 07/23/2015 1254   HDL 43 07/23/2015 1254   CHOLHDL 2.8 07/23/2015 1254   VLDL 11 07/23/2015 1254   LDLCALC 68 07/23/2015 1254   HgbA1c:  Lab Results  Component Value Date   HGBA1C 6.5* 07/23/2015   Urine Drug Screen: No results found for: LABOPIA, COCAINSCRNUR, LABBENZ, AMPHETMU, THCU, LABBARB    IMAGING  Dg Chest 2 View 07/23/2015   No acute cardiopulmonary findings.   Ct Head Wo Contrast 07/23/2015   Atrophy with extensive small vessel chronic ischemic changes of deep cerebral white matter. Probable small old lacunar infarcts at RIGHT cerebellum, LEFT basal ganglia and thalami. Question small old LEFT occipital infarct. No acute intracranial abnormalities.   Ct Angio Head & Neck, CT Cerebral Perfusion W/cm 07/23/2015  No disease demonstrated in the neck. 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 Some distal flow is reconstituted, though the vessels are smaller and less numerous. Perfusion suggests ischemia involving the majority of the right MCA territory with sparing of the basal ganglia. Deeper tissues appear to show core infarct with more superficial  tissues representing penumbra.    PHYSICAL EXAM Pleasant elderly male currently not in distress. . Afebrile. Head is nontraumatic. Neck is supple without bruit.    Cardiac exam no murmur or gallop. Lungs are clear to auscultation. Distal pulses are well felt. Neurological Exam : Awake alert oriented x 3 normal speech and language. Mild left lower face asymmetry. Tongue midline. No drift. Mild diminished fine finger movements on left. Orbits right over left upper extremity. Mild left grip weak.. Normal sensation . Normal coordination.  ASSESSMENT/PLAN [name] is a 82 y.o. male with history of atrial fibrillation on  xarelto, diet controlled diabetes, tachy/brady syndrome s/p pacer presenting with left facial droop and slurred speech. He did not receive IV t-PA due to being on xarelto (missed one dose day prior to arrival).   Stroke:   Nondominant right MCA infarct embolic secondary to known atrial fibrillation  Resultant left hemiparesis  MRI/MRA  pacer  CT angiogram head occlusion right M1 M2 junction with distal reconstitution of flow  CT perfusion right MCA territory infarct with sparing of basal ganglia  CT angiogram neck unremarkable  2D Echo  pending  LDL 68  HgbA1c 6.5  Xarelto for VTE prophylaxis Diet Carb Modified Fluid consistency:: Thin; Room service appropriate?: Yes  Xarelto (rivaroxaban) daily prior to admission, now on Xarelto (rivaroxaban) daily  Patient counseled to be compliant with his antithrombotic medications  Ongoing aggressive stroke risk factor management  Therapy recommendations:  No OT  Disposition:  Pending. Anticipate returning home.  Okay for transfer to floor from stroke standpoint  Atrial Fibrillation  Home anticoagulation:  Xarelto (rivaroxaban) daily continued in the hospital  missed one dose day prior to arrival. States he ran out because he was using samples from the doctor's office.  CHA2DS2-VASc Score = 6, ?2 oral anticoagulation recommended  Age in Years:  ?89   +2    Sex:  Male   0  Hypertension History:  yes   +1     Diabetes Mellitus:  yes   +1   Congestive Heart Failure History:  0  Vascular Disease History:  0  Stroke/TIA/Thromboembolism History:  yes   +2  Continue Xarelto at discharge   Hypertension  Stable  Diabetes  HgbA1c 6.5, at goal < 7.0  Controlled  Other Stroke Risk Factors  Advanced age  Former Cigarette smoker, quit smoking 60 years ago   Former former smokeless tobacco, quit 17 years ago  Other Active Problems  hx SBO, last 2013  Tachybradycardia syndrome status post pacemaker  Hospital day #  [place] for Pager information 07/24/2015 4:11 PM  I have personally examined this patient, reviewed notes, independently viewed imaging studies, participated in medical decision making and plan of care. I have made any additions or clarifications directly to the above note. Agree with note above.  Patient has an embolic MCA branch infarct secondary to A. fib and has not been compliant with his Xarelto. I counseled the patient to take his medication everyday otherwise he will not get full protection. He remains at risk for neurological worsening, recurrent stroke, TIA and needs ongoing stroke evaluation.  Antony Contras, MD Medical Director [facility] Pager: [phone] 07/24/2015 6:19 PM    To contact Stroke Continuity provider, please refer to [email or web address] After hours, contact General Neurology

## 2015-07-24 NOTE — Progress Notes (Signed)
[facility] PROGRESS NOTE  BACILIO RHYNER [id number] DOB: 1923/08/07 DOA: 07/23/2015 PCP: Foye Spurling, MD  Assessment/Plan:  Principal Problem:    Embolic Stroke (cerebrum) ([place]) secondary to A fib. Workup underway. Symptoms much improved. Transfer to floor. Active Problems:   Diabetes [facility]): start SSI   Atrial fibrillation ([place]) chadsvasc 6. On xarelto   PPM-[employer]   Anemia   Chronic kidney disease  HPI/Subjective: No complaints. No weakness or speech difficulty  Objective: Filed Vitals:   07/24/15 0508 07/24/15 0721  BP: 146/82 161/90  Pulse: 62 119  Temp:  98.4 F (36.9 C)  Resp: 13 12    Intake/Output Summary (Last 24 hours) at 07/24/15 1233 Last data filed at 07/24/15 1115  Gross per 24 hour  Intake 192.33 ml  Output   1350 ml  Net -1157.67 ml   Filed Weights   07/23/15 1310 07/23/15 1800  Weight: 75.8 kg (167 lb 1.7 oz) 71.895 kg (158 lb 8 oz)    Exam:   General:  In chair. A and o.  Cardiovascular: irreg irreg without MGR  Respiratory: CTA without WRR  Abdomen: S, NT, ND  Ext: no CCE  Neuro: speech clear and fluent. CN intact. Motor 5/5 throughout  Basic Metabolic Panel:  Recent Labs Lab 07/23/15 1254 07/23/15 1300  NA 140 142  K 4.2 4.1  CL 106 104  CO2 24  --   GLUCOSE 75 73  BUN 11 13  CREATININE 1.51* 1.50*  CALCIUM 8.4*  --    Liver Function Tests:  Recent Labs Lab 07/23/15 1254  AST 22  ALT 9*  ALKPHOS 75  BILITOT 0.4  PROT 5.9*  ALBUMIN 3.5   No results for input(s): LIPASE, AMYLASE in the last 168 hours. No results for input(s): AMMONIA in the last 168 hours. CBC:  Recent Labs Lab 07/23/15 1254 07/23/15 1300  WBC 6.9  --   NEUTROABS 4.4  --   HGB 10.8* 12.9*  HCT 33.6* 38.0*  MCV 77.2*  --   PLT 149*  --    Cardiac Enzymes: No results for input(s): CKTOTAL, CKMB, CKMBINDEX, TROPONINI in the last 168 hours. BNP (last 3 results) No results for input(s): BNP in the last 8760  hours.  ProBNP (last 3 results) No results for input(s): PROBNP in the last 8760 hours.  CBG:  Recent Labs Lab 07/23/15 1810 07/23/15 2117 07/24/15 0046 07/24/15 0629 07/24/15 0802  GLUCAP 74 86 89 93 91    Recent Results (from the past 240 hour(s))  MRSA PCR Screening     Status: None   Collection Time: 07/23/15  6:44 PM  Result Value Ref Range Status   MRSA by PCR NEGATIVE NEGATIVE Final    Comment:        The GeneXpert MRSA Assay (FDA approved for NASAL specimens only), is one component of a comprehensive MRSA colonization surveillance program. It is not intended to diagnose MRSA infection nor to guide or monitor treatment for MRSA infections.      Studies: Ct Angio Head W/cm &/or Wo Cm  07/23/2015  CLINICAL DATA:  Right-sided gaze disturbance of left-sided weakness. EXAM: CT ANGIOGRAPHY HEAD AND NECK with CT perfusion TECHNIQUE: Multidetector CT imaging of the head and neck was performed using the standard protocol during bolus administration of intravenous contrast. Multiplanar CT image reconstructions and MIPs were obtained to evaluate the vascular anatomy. Carotid stenosis measurements (when applicable) are obtained utilizing NASCET criteria, using the distal internal carotid diameter as the denominator. CONTRAST:  167mL OMNIPAQUE IOHEXOL 350 MG/ML SOLN COMPARISON:  Head CT earlier same day FINDINGS: CTA NECK Aortic arch: There is atherosclerosis of the aortic arch without aneurysm or dissection. 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 No origin stenosis. Right carotid system: Common carotid artery widely patent to the bifurcation. The carotid bifurcation is normal without stenosis or irregularity. Cervical internal carotid artery is normal. Left carotid system: Common carotid artery widely patent to the bifurcation. Normal appearing bifurcation without stenosis or irregularity. Cervical internal carotid artery is  normal. Vertebral arteries:Vertebral artery origins are widely patent. Left vertebral artery arises directly from the arch as noted previously. Left vertebral artery is larger than the right. Both vertebral arteries are widely patent through the cervical region. Skeleton: Ordinary spondylosis and facet arthropathy Other neck: No mass or lymphadenopathy. Emphysema and scarring at the lung apices. CTA HEAD Anterior circulation: Both internal carotid arteries widely patent through the skullbase and siphon regions. No stenosis. The anterior and middle cerebral vessels are patent on the left without proximal stenosis, aneurysm or vascular malformation. No missing vessels are detected. On the right, there is occlusion or near occlusion of the MCA at the M1 M2 junction more distal vessels do show opacification, presumably from collaterals, though the vessels are smaller and less numerous. Posterior circulation: Both vertebral arteries patent through the foramen magnum to the basilar. No basilar stenosis. Posterior circulation branch vessels are normal. Venous sinuses: Patent and normal Anatomic variants: None significant Delayed phase: No abnormal enhancement CT perfusion Delayed mean transit time, delayed cerebral blood flow and delayed time to peak throughout a large portion of the right MCA territory including the insula, posterior frontal and parietal region. Deep portions of this territory show or reduced blood volume suggesting core infarct, while the majority of the insular region and superficial brain shows normal cerebral blood volume suggesting penumbra. Basal ganglia perfuse normally. IMPRESSION: No disease demonstrated in the neck. 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 Some distal flow is reconstituted, though the vessels are smaller and less numerous. Perfusion suggests ischemia involving the majority of the right MCA territory with sparing of the basal ganglia.  Deeper tissues appear to show core infarct with more superficial tissues representing penumbra. These results were called by telephone at the time of interpretation on 07/23/2015 at 2:36 pm to Dr. Audria Nine , who verbally acknowledged these results. Electronically Signed   By: Nelson Chimes M.D.   On: 07/23/2015 14:39   Dg Chest 2 View  07/23/2015  CLINICAL DATA:  Left facial droop and weakness today at church. EXAM: CHEST  2 VIEW COMPARISON:  03/05/2013. FINDINGS: The right atrial and right ventricular wires are stable. Stable mild cardiac enlargement. There is tortuosity of the thoracic aorta. The lungs are clear of an acute process. Mild chronic bronchitic changes. No pleural effusion. The bony thorax is grossly intact. Bilateral nipple shadows are noted. IMPRESSION: No acute cardiopulmonary findings. Electronically Signed   By: Marijo Sanes M.D.   On: 07/23/2015 16:59   Ct Head Wo Contrast  07/23/2015  CLINICAL DATA:  LEFT side weakness, code stroke, facial droop, last seen normal at 1200 hours, history atrial fibrillation, hypertension, diabetes mellitus, prostate cancer, former smoker EXAM: CT HEAD WITHOUT CONTRAST TECHNIQUE: Contiguous axial images were obtained from the base of the skull through the vertex without intravenous contrast. COMPARISON:  None FINDINGS: Generalized atrophy. Normal ventricular morphology. No  midline shift or mass effect. Extensive small vessel chronic ischemic changes of deep cerebral white matter. Question small old lacunar infarcts at the thalami and at the LEFT basal ganglia. Probable old lacunar infarct in RIGHT cerebellar hemisphere. Questionable small cortical infarct LEFT occipital lobe. No intracranial hemorrhage, mass lesion or evidence acute infarction. No extra-axial fluid collections. Bones demineralized. Visualized paranasal sinuses mastoid air cells show no significant abnormalities. Mild LEFT vertebral artery atherosclerotic calcification. IMPRESSION: Atrophy  with extensive small vessel chronic ischemic changes of deep cerebral white matter. Probable small old lacunar infarcts at RIGHT cerebellum, LEFT basal ganglia and thalami. Question small old LEFT occipital infarct. No acute intracranial abnormalities. Findings called on 07/23/2015 to Dr. Ralene Bathe at 1307 hrs and to Dr. Silverio Decamp at 1310 hrs. Electronically Signed   By: Lavonia Dana M.D.   On: 07/23/2015 13:12   Ct Angio Neck W/cm &/or Wo/cm  07/23/2015  CLINICAL DATA:  Right-sided gaze disturbance of left-sided weakness. EXAM: CT ANGIOGRAPHY HEAD AND NECK with CT perfusion TECHNIQUE: Multidetector CT imaging of the head and neck was performed using the standard protocol during bolus administration of intravenous contrast. Multiplanar CT image reconstructions and MIPs were obtained to evaluate the vascular anatomy. Carotid stenosis measurements (when applicable) are obtained utilizing NASCET criteria, using the distal internal carotid diameter as the denominator. CONTRAST:  183mL OMNIPAQUE IOHEXOL 350 MG/ML SOLN COMPARISON:  Head CT earlier same day FINDINGS: CTA NECK Aortic arch: There is atherosclerosis of the aortic arch without aneurysm or dissection. 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 No origin stenosis. Right carotid system: Common carotid artery widely patent to the bifurcation. The carotid bifurcation is normal without stenosis or irregularity. Cervical internal carotid artery is normal. Left carotid system: Common carotid artery widely patent to the bifurcation. Normal appearing bifurcation without stenosis or irregularity. Cervical internal carotid artery is normal. Vertebral arteries:Vertebral artery origins are widely patent. Left vertebral artery arises directly from the arch as noted previously. Left vertebral artery is larger than the right. Both vertebral arteries are widely patent through the cervical region. Skeleton: Ordinary spondylosis  and facet arthropathy Other neck: No mass or lymphadenopathy. Emphysema and scarring at the lung apices. CTA HEAD Anterior circulation: Both internal carotid arteries widely patent through the skullbase and siphon regions. No stenosis. The anterior and middle cerebral vessels are patent on the left without proximal stenosis, aneurysm or vascular malformation. No missing vessels are detected. On the right, there is occlusion or near occlusion of the MCA at the M1 M2 junction more distal vessels do show opacification, presumably from collaterals, though the vessels are smaller and less numerous. Posterior circulation: Both vertebral arteries patent through the foramen magnum to the basilar. No basilar stenosis. Posterior circulation branch vessels are normal. Venous sinuses: Patent and normal Anatomic variants: None significant Delayed phase: No abnormal enhancement CT perfusion Delayed mean transit time, delayed cerebral blood flow and delayed time to peak throughout a large portion of the right MCA territory including the insula, posterior frontal and parietal region. Deep portions of this territory show or reduced blood volume suggesting core infarct, while the majority of the insular region and superficial brain shows normal cerebral blood volume suggesting penumbra. Basal ganglia perfuse normally. IMPRESSION: No disease demonstrated in the neck. 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 Some distal flow is reconstituted, though the vessels are smaller and less numerous. Perfusion suggests ischemia involving  the majority of the right MCA territory with sparing of the basal ganglia. Deeper tissues appear to show core infarct with more superficial tissues representing penumbra. These results were called by telephone at the time of interpretation on 07/23/2015 at 2:36 pm to Dr. Audria Nine , who verbally acknowledged these results. Electronically Signed   By: Nelson Chimes M.D.    On: 07/23/2015 14:39   Ct Cerebral Perfusion W/cm  07/23/2015  CLINICAL DATA:  Right-sided gaze disturbance of left-sided weakness. EXAM: CT ANGIOGRAPHY HEAD AND NECK with CT perfusion TECHNIQUE: Multidetector CT imaging of the head and neck was performed using the standard protocol during bolus administration of intravenous contrast. Multiplanar CT image reconstructions and MIPs were obtained to evaluate the vascular anatomy. Carotid stenosis measurements (when applicable) are obtained utilizing NASCET criteria, using the distal internal carotid diameter as the denominator. CONTRAST:  180mL OMNIPAQUE IOHEXOL 350 MG/ML SOLN COMPARISON:  Head CT earlier same day FINDINGS: CTA NECK Aortic arch: There is atherosclerosis of the aortic arch without aneurysm or dissection. 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 No origin stenosis. Right carotid system: Common carotid artery widely patent to the bifurcation. The carotid bifurcation is normal without stenosis or irregularity. Cervical internal carotid artery is normal. Left carotid system: Common carotid artery widely patent to the bifurcation. Normal appearing bifurcation without stenosis or irregularity. Cervical internal carotid artery is normal. Vertebral arteries:Vertebral artery origins are widely patent. Left vertebral artery arises directly from the arch as noted previously. Left vertebral artery is larger than the right. Both vertebral arteries are widely patent through the cervical region. Skeleton: Ordinary spondylosis and facet arthropathy Other neck: No mass or lymphadenopathy. Emphysema and scarring at the lung apices. CTA HEAD Anterior circulation: Both internal carotid arteries widely patent through the skullbase and siphon regions. No stenosis. The anterior and middle cerebral vessels are patent on the left without proximal stenosis, aneurysm or vascular malformation. No missing vessels are  detected. On the right, there is occlusion or near occlusion of the MCA at the M1 M2 junction more distal vessels do show opacification, presumably from collaterals, though the vessels are smaller and less numerous. Posterior circulation: Both vertebral arteries patent through the foramen magnum to the basilar. No basilar stenosis. Posterior circulation branch vessels are normal. Venous sinuses: Patent and normal Anatomic variants: None significant Delayed phase: No abnormal enhancement CT perfusion Delayed mean transit time, delayed cerebral blood flow and delayed time to peak throughout a large portion of the right MCA territory including the insula, posterior frontal and parietal region. Deep portions of this territory show or reduced blood volume suggesting core infarct, while the majority of the insular region and superficial brain shows normal cerebral blood volume suggesting penumbra. Basal ganglia perfuse normally. IMPRESSION: No disease demonstrated in the neck. 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 Some distal flow is reconstituted, though the vessels are smaller and less numerous. Perfusion suggests ischemia involving the majority of the right MCA territory with sparing of the basal ganglia. Deeper tissues appear to show core infarct with more superficial tissues representing penumbra. These results were called by telephone at the time of interpretation on 07/23/2015 at 2:36 pm to Dr. Audria Nine , who verbally acknowledged these results. Electronically Signed   By: Nelson Chimes M.D.   On: 07/23/2015 14:39    Scheduled Meds: . rivaroxaban  15 mg Oral QAC supper  Continuous Infusions:   Time spent: 35 minutes  [facility] [email or web address], password [facility] 07/24/2015, 12:33 PM  LOS: 1 day

## 2015-07-24 NOTE — Progress Notes (Signed)
  Echocardiogram 2D Echocardiogram has been performed.  [name] M 07/24/2015, 3:18 PM

## 2015-07-24 NOTE — Evaluation (Signed)
Physical Therapy Evaluation Patient Details Name: [name] MRN: [id number] DOB: 06-05-1923 Today's Date: 07/24/2015   History of Present Illness  Pt is an 82 yo male admitted with  L side weakness and facial droop as well as slurred speech.  Pt with R MCA occlusion.  Pt with h/o DM, afib, brady/tachycardia syndrome s/p pacemaker.  Symptoms appear to be resolving.  Clinical Impression  Pt admitted with above diagnosis. Pt currently with functional limitations due to the deficits listed below (see PT Problem List). [name] presents w/ impaired balance and decreased Bil LE strength, requiring min guard assist for safe ambulation.  Pt to discuss w/ family about having 24/7 supervision short term following d/c.  If family/friends unable to provide supervision then PT recommending ST SNF to address balance impairments. Pt will benefit from skilled PT to increase their independence and safety with mobility to allow discharge to the venue listed below.      Follow Up Recommendations SNF;Home health PT;Supervision/Assistance - 24 hour (ST SNF if family can't provide 24/7 supervision at d/c)    Equipment Recommendations  Rolling walker with 5" wheels    Recommendations for Other Services       Precautions / Restrictions Precautions Precautions: Fall Restrictions Weight Bearing Restrictions: No      Mobility  Bed Mobility Overal bed mobility: Modified Independent             General bed mobility comments: Pt sitting in recliner chair upon PT arrival  Transfers Overall transfer level: Needs assistance Equipment used: None Transfers: Sit to/from Stand Sit to Stand: Supervision Stand pivot transfers: Min guard       General transfer comment: No cues or physical assist needed for sit<>stand.  Supervision for safety.  Ambulation/Gait Ambulation/Gait assistance: Min guard Ambulation Distance (Feet): 150 Feet Assistive device: Rolling walker (2 wheeled);None Gait  Pattern/deviations: Step-through pattern;Staggering right;Staggering left;Narrow base of support;Decreased stride length   Gait velocity interpretation: Below normal speed for age/gender General Gait Details: No LOB but pt unsteady and staggers slightly Lt and Rt, improves w/ introduction of RW.  Instability noted w/ high level balance activities listed below.  Stairs Stairs: Yes Stairs assistance: Min guard Stair Management: No rails;Step to pattern;Forwards Number of Stairs: 2 General stair comments: No LOB but pt unsteady and hovers hands over railings.  Wheelchair Mobility    Modified Rankin (Stroke Patients Only) Modified Rankin (Stroke Patients Only) Pre-Morbid Rankin Score: No symptoms Modified Rankin: Moderate disability     Balance Overall balance assessment: Needs assistance Sitting-balance support: No upper extremity supported;Feet supported Sitting balance-Leahy Scale: Good     Standing balance support: No upper extremity supported;During functional activity Standing balance-Leahy Scale: Fair Standing balance comment: unsteady w/ dynamic activity             High level balance activites: Backward walking;Turns;Head turns;Other (comment) (stepping over object) High Level Balance Comments: Instability w/ high level balance activities, although not requiring outside support             Pertinent Vitals/Pain Pain Assessment: No/denies pain Pain Score: 5  Pain Location: pain from catheter. Pain Descriptors / Indicators: Pressure Pain Intervention(s): Repositioned;Monitored during session;Limited activity within patient's tolerance    Home Living Family/patient expects to be discharged to:: Private residence Living Arrangements: Alone Available Help at Discharge: Family;Available PRN/intermittently Type of Home: House Home Access: Stairs to enter Entrance Stairs-Rails: None Entrance Stairs-Number of Steps: 2 Home Layout: One level Home Equipment:  None  Prior Function Level of Independence: Independent         Comments: Pt drives and is fully independent.  No assistive devices.     Hand Dominance   Dominant Hand: Right    Extremity/Trunk Assessment   Upper Extremity Assessment: Defer to OT evaluation           Lower Extremity Assessment: Generalized weakness (strength grossly 4/5 Bil LEs)      Cervical / Trunk Assessment: Normal  Communication   Communication: Other (comment) (mildly slurred speech.)  Cognition Arousal/Alertness: Awake/alert Behavior During Therapy: WFL for tasks assessed/performed Overall Cognitive Status: Within Functional Limits for tasks assessed                      General Comments General comments (skin integrity, edema, etc.): Pt to discuss w/ family about having 24/7 supervision short term following d/c.  If family/friends unable to provide supervision then PT recommending ST SNF to address balance impairments.    Exercises        Assessment/Plan    PT Assessment Patient needs continued PT services  PT Diagnosis Difficulty walking;Generalized weakness   PT Problem List Decreased strength;Decreased balance;Decreased knowledge of use of DME;Decreased safety awareness  PT Treatment Interventions DME instruction;Gait training;Stair training;Functional mobility training;Therapeutic activities;Therapeutic exercise;Balance training;Patient/family education   PT Goals (Current goals can be found in the Care Plan section) Acute Rehab PT Goals Patient Stated Goal: to go home PT Goal Formulation: With patient Time For Goal Achievement: 08/07/15 Potential to Achieve Goals: Good    Frequency Min 3X/week   Barriers to discharge Inaccessible home environment;Decreased caregiver support Lives alone and has steps to enter home    Co-evaluation               End of Session Equipment Utilized During Treatment: Gait belt Activity Tolerance: Patient tolerated treatment  well Patient left: in chair;with call bell/phone within reach Nurse Communication: Mobility status         Time: [id number] PT Time Calculation (min) (ACUTE ONLY): 20 min   Charges:   PT Evaluation $PT Eval Moderate Complexity: 1 Procedure     PT G Codes:       Collie Siad PT, DPT  Pager: [phone] Phone: [phone] 07/24/2015, 12:28 PM

## 2015-07-24 NOTE — Progress Notes (Addendum)
Physician notified: Conley Canal At: 0731  Regarding: FYI Pt c/o intermittent chest heaviness. Admitted for CVA. Obtaining stat EKG per standing orders. No other distress noted. Will continue to monitor.   EKG cancelled.   Physician notified: Conley Canal At: 1303  Regarding: CBG 188, has carb mod diet now. no coverage or oral DM meds.

## 2015-07-24 NOTE — Evaluation (Signed)
Clinical/Bedside Swallow Evaluation Patient Details  Name: [name] MRN: [id number] Date of Birth: 08-Dec-1923  Today's Date: 07/24/2015 Time: SLP Start Time (ACUTE ONLY): 1015 SLP Stop Time (ACUTE ONLY): 1025 SLP Time Calculation (min) (ACUTE ONLY): 10 min  Past Medical History:  Past Medical History  Diagnosis Date  . Tachycardia-bradycardia syndrome ([place])   . Atrial fibrillation ([place])   . Hypertension   . Diabetes mellitus   . Hx SBO     Last 2013 all treated conservatively  . Ventral hernia     x3  . Prostate cancer ([place])   . CKD (chronic kidney disease)    Past Surgical History:  Past Surgical History  Procedure Laterality Date  . Exploratory laparotomy    . Small intestine surgery  09/16/2004    SBO resection, VH repair  . Hemorrhoid surgery    . Hiatal hernia repair  2002  . Pacemaker insertion    . Cholecystectomy open  03/31/2001    Dr Lindon Romp  . Permanent pacemaker generator change N/A 04/06/2012    Procedure: PERMANENT PACEMAKER GENERATOR CHANGE;  Surgeon: Evans Lance, MD;  Location: [facility] CATH LAB;  Service: Cardiovascular;  Laterality: N/A;   HPI:  [name] is an 82 y.o. male patient who presented with acute onset of moderate to severe dysarthria, right gaze deviation, left [place]ipleg[place] . By the time he was evaluated in the ER, he continued to have right gaze deviation with left-sided neglect although his motor symptoms have improved and initially showed some mild left-sided drift, and within a few minutes improved to full motor strength in bilateral upper and lower extremities. CT negative.    Assessment / Plan / Recommendation Clinical Impression  Pt demonstrates normal swallow function, no signs of aspiration or impairment. Pt does not usually wear dentures for eating and independently chooses soft meats. Recommend regular texture diet with thin liquids. Speech and language function screened, with defer  full eval as pt appears at baseline.     Aspiration Risk  Mild aspiration risk    Diet Recommendation Regular;Thin liquid   Liquid Administration via: Cup;Straw Medication Administration: Whole meds with liquid Supervision: Patient able to self feed    Other  Recommendations     Follow up Recommendations  None    Frequency and Duration            Prognosis        Swallow Study   General HPI: [name] is an 82 y.o. male patient who presented with acute onset of moderate to severe dysarthria, right gaze deviation, left hemiplegia [place] . By the time he was evaluated in the ER, he continued to have right gaze deviation with left-sided neglect although his motor symptoms have improved and initially showed some mild left-sided drift, and within a few minutes improved to full motor strength in bilateral upper and lower extremities. CT negative.  Type of Study: Bedside Swallow Evaluation Previous Swallow Assessment: none Diet Prior to this Study: NPO Temperature Spikes Noted: No Respiratory Status: Room air History of Recent Intubation: No Behavior/Cognition: Alert;Cooperative;Pleasant mood Oral Cavity Assessment: Within Functional Limits Oral Care Completed by SLP: No Oral Cavity - Dentition: Edentulous Vision: Functional for self-feeding Self-Feeding Abilities: Able to feed self Patient Positioning: Upright in chair Baseline Vocal Quality: Normal Volitional Cough: Strong Volitional Swallow: Able to elicit    Oral/Motor/Sensory Function Overall Oral Motor/Sensory Function:  Within functional limits (slight facial weakness on left?)   Ice Chips     Thin Liquid Thin Liquid: Within functional limits Presentation: Cup;Straw    Nectar Thick Nectar Thick Liquid: Not tested   Honey Thick Honey Thick Liquid: Not tested   Puree Puree: Within functional limits   Solid   GO   Solid: Within functional limits       [facility],  MA CCC-SLP [id number]  Lynann Beaver 07/24/2015,11:38 AM

## 2015-07-24 NOTE — Progress Notes (Signed)
Physician notified: Conley Canal At: 1627  Regarding: ? SSI? CBG 187 this evening. 188 at noon.

## 2015-07-24 NOTE — Evaluation (Signed)
Occupational Therapy Evaluation Patient Details Name: [name] MRN: [id number] DOB: [birth year] Today's Date: 07/24/2015    History of Present Illness Pt is an 82 yo male admitted with  L side weaknes and facila droop as well as slurred speech.  Pt with R MCA occlusion.  Pt with h/o DM, afib, brady/tachycardia syndrome sp pacemaker.  Symptoms appear to be resolving.   Clinical Impression   Pt admitted with the above diagnosis and has the mild deficits listed below. Pt would benefit from 1-2 more sessions of OT to ensure full independence with all adls so he can d/c home alone.  Pt close to baseline at this time and most likely will not need follow up OT.    Follow Up Recommendations  No OT follow up;Supervision - Intermittent    Equipment Recommendations  None recommended by OT    Recommendations for Other Services       Precautions / Restrictions Restrictions Weight Bearing Restrictions: No      Mobility Bed Mobility Overal bed mobility: Modified Independent             General bed mobility comments: Came to EOB with bed flat and no rails used.  Transfers Overall transfer level: Needs assistance Equipment used: None Transfers: Sit to/from [employer] Sit to Stand: Supervision Stand pivot transfers: Min guard       General transfer comment: This was first time up for pt.  Pt was overall steady on feet. Walking into hallway and pt pushed IV pole.  No LOB.    Balance Overall balance assessment: Needs assistance Sitting-balance support: Feet supported Sitting balance-Leahy Scale: Good     Standing balance support: During functional activity;No upper extremity supported Standing balance-Leahy Scale: Fair Standing balance comment: Pt able to stand w/o holding onto anything.  No challenges given.                            ADL Overall ADL's : Needs assistance/impaired Eating/Feeding: NPO Eating/Feeding Details (indicate cue  type and reason): Pt NPO until speech comes but should be able to feed self. Grooming: Wash/dry hands;Wash/dry face;Oral care;Set up;Sitting   Upper Body Bathing: Set up;Sitting   Lower Body Bathing: Min guard;Sit to/from stand   Upper Body Dressing : Set up;Sitting   Lower Body Dressing: Min guard;Sit to/from stand   Toilet Transfer: Min guard;Ambulation;Comfort height toilet;Grab bars   Toileting- Clothing Manipulation and Hygiene: Supervision/safety;Sit to/from stand       Functional mobility during ADLs: Min guard General ADL Comments: Pt appears to be very close to his baseline with adls. Pt did not lose balance while on feet but has not been on feet much so min guard provided.     Vision Vision Assessment?: No apparent visual deficits   Perception Perception Perception Tested?: Yes   Praxis Praxis Praxis tested?: Within functional limits    Pertinent Vitals/Pain Pain Assessment: 0-10 Pain Score: 5  Pain Location: pain from catheter. Pain Descriptors / Indicators: Pressure Pain Intervention(s): Repositioned;Monitored during session;Limited activity within patient's tolerance     Hand Dominance Right   Extremity/Trunk Assessment Upper Extremity Assessment Upper Extremity Assessment: Overall WFL for tasks assessed   Lower Extremity Assessment Lower Extremity Assessment: Defer to PT evaluation   Cervical / Trunk Assessment Cervical / Trunk Assessment: Normal   Communication Communication Communication: Other (comment) (mildly slurred speech.)   Cognition Arousal/Alertness: Awake/alert Behavior During Therapy: WFL for tasks assessed/performed Overall Cognitive Status:  Within Functional Limits for tasks assessed                     General Comments       Exercises       Shoulder Instructions      Home Living Family/patient expects to be discharged to:: Private residence Living Arrangements: Alone Available Help at Discharge:  Friend(s);Available PRN/intermittently Type of Home: House Home Access: Stairs to enter [employer] of Steps: 2 Entrance Stairs-Rails: None Home Layout: One level     Bathroom Shower/Tub: Tub/shower unit;Curtain Shower/tub characteristics: Architectural technologist: Standard     Home Equipment: None          Prior Functioning/Environment Level of Independence: Independent        Comments: Pt drives and is fully independent.  No assistive devices.    OT Diagnosis: Generalized weakness   OT Problem List: Decreased strength;Impaired balance (sitting and/or standing);Decreased knowledge of use of DME or AE;Pain   OT Treatment/Interventions: Self-care/ADL training;Therapeutic activities;DME and/or AE instruction    OT Goals(Current goals can be found in the care plan section) Acute Rehab OT Goals Patient Stated Goal: to go home OT Goal Formulation: With patient Time For Goal Achievement: 07/31/15 Potential to Achieve Goals: Good ADL Goals Additional ADL Goal #1: Pt will gather clothes from closet and dress Ily Additional ADL Goal #2: Pt will walk to bathroom and toilet Ily Additional ADL Goal #3: Pt will state 3 signs symptoms of a CVA w/o cues.  OT Frequency: Min 2X/week   Barriers to D/C: Decreased caregiver support  no assist at home but close enough to baseline that he may be able to go home with neighbors checking in on him.       Co-evaluation              End of Session Nurse Communication: Mobility status;Other (comment) (pain from catheter)  Activity Tolerance: Patient tolerated treatment well Patient left: in chair;with call bell/phone within reach   Time: 0856-0922 OT Time Calculation (min): 26 min Charges:  OT General Charges $OT Visit: 1 Procedure OT Evaluation $OT Eval Moderate Complexity: 1 Procedure OT Treatments $Self Care/Home Management : 8-22 mins G-Codes:    Glenford Peers 2015-08-02, 10:03 AM  [phone]

## 2015-07-24 NOTE — Care Management Note (Signed)
Case Management Note  Patient Details  Name: [name] MRN: [id number] Date of Birth: December 24, 1923  Subjective/Objective:   Date: 07/24/15 Spoke with patient at the bedside.  Introduced self as Tourist information centre manager and explained role in discharge planning and how to be reached.  Verified patient lives in town, alone with Curator and niece who both works from 8 am to [employer], he does not have 24 hr care at this time, but he would like to check with his sister who lives in [place] , to see if she can come and stay with him for a while,  Her name is Benay Spice [phone]. Expressed potential need for rolling walker, he [facility]. Verified patient anticipates to go home with family, but does not have 24 hr care  at time of discharge, and will have part-time supervision by family at this time to best of their knowledge.  ( NCM informed patient that if he does not have 24hr care would be safer for him to go to [facility]) Patient denied needing help with their medication.  Patient drives and  is driven by  Family as well to MD appointments. Verified patient has PCP Jeanann Lewandowsky.   Plan: CM will continue to follow for discharge planning and [facility] resources.                  Action/Plan:   Expected Discharge Date:                  Expected Discharge Plan:  [place]  In-House Referral:     Discharge planning Services  CM Consult  Post Acute Care Choice:    Choice offered to:     DME Arranged:    DME Agency:     HH Arranged:    [place] Agency:     Status of Service:  In process, will continue to follow  Medicare Important Message Given:    Date Medicare IM Given:    Medicare IM give by:    Date Additional Medicare IM Given:    Additional Medicare Important Message give by:     If discussed at [place] of Stay Meetings, dates discussed:    Additional Comments:  Zenon Mayo, RN 07/24/2015, 4:47 PM

## 2015-07-25 LAB — GLUCOSE, CAPILLARY
GLUCOSE-CAPILLARY: 119 mg/dL — AB (ref 65–99)
Glucose-Capillary: 98 mg/dL (ref 65–99)

## 2015-07-25 MED ORDER — RIVAROXABAN 15 MG PO TABS: 15.0000 mg | ORAL_TABLET | Freq: Every day | ORAL | Status: DC

## 2015-07-25 NOTE — Care Management Note (Signed)
Case Management Note  Patient Details  Name: [name] MRN: [id number] Date of Birth: March 08, 1924  Subjective/Objective:                    Action/Plan: Patient discharging home with [facility] services. CM met with the patient and his family and provided them a list of [place] agencies in the [place] area. They selected [facility]. CM spoke with [facility] and they accepted the referral. Pt also ordered a rolling walker. Manuela Schwartz with [facility] DME notified and will deliver the equipment to the room. Bedside RN updated.   Expected Discharge Date:                  Expected Discharge Plan:  [place]  In-House Referral:     Discharge planning Services  CM Consult  Post Acute Care Choice:  Durable Medical Equipment, Home Health Choice offered to:  Patient  DME Arranged:    DME Agency:  [place] Arranged:  RN, PT [facility] Agency:  [place]  Status of Service:  Completed, signed off  Medicare Important Message Given:  Yes Date Medicare IM Given:    Medicare IM give by:    Date Additional Medicare IM Given:    Additional Medicare Important Message give by:     If discussed at [place] of Stay Meetings, dates discussed:    Additional Comments:  Pollie Friar, RN 07/25/2015, 1:33 PM

## 2015-07-25 NOTE — Progress Notes (Signed)
Discharge instructions reviewed with patient/family. RX given. All questions answered at this time. Awaiting for walker to be deliver. Will transport home per family once equipment arrived.   Ave Filter, RN

## 2015-07-25 NOTE — Progress Notes (Signed)
STROKE TEAM PROGRESS NOTE   SUBJECTIVE (INTERVAL HISTORY) Patient up in the chair. Concerned because his symptoms he thinks are from injured from the [place]. He wants form sent in to them. Dr. Leonie Man recommended him take a copy of his discharge paperwork with him to the [place] to discuss. [facility] best to decided about [facility] exposure.   OBJECTIVE Temp:  [97.9 F (36.6 C)-98.7 F (37.1 C)] 98.4 F (36.9 C) (03/07 1114) Pulse Rate:  [66-105] 71 (03/07 1114) Cardiac Rhythm:  [-] Atrial fibrillation (03/07 0700) Resp:  [15-20] 16 (03/07 1114) BP: (132-149)/(66-91) 145/72 mmHg (03/07 1114) SpO2:  [99 %-100 %] 100 % (03/07 1114)  CBC:   Recent Labs Lab 07/23/15 1254 07/23/15 1300  WBC 6.9  --   NEUTROABS 4.4  --   HGB 10.8* 12.9*  HCT 33.6* 38.0*  MCV 77.2*  --   PLT 149*  --     Basic Metabolic Panel:   Recent Labs Lab 07/23/15 1254 07/23/15 1300  NA 140 142  K 4.2 4.1  CL 106 104  CO2 24  --   GLUCOSE 75 73  BUN 11 13  CREATININE 1.51* 1.50*  CALCIUM 8.4*  --     Lipid Panel:     Component Value Date/Time   CHOL 122 07/23/2015 1254   TRIG 55 07/23/2015 1254   HDL 43 07/23/2015 1254   CHOLHDL 2.8 07/23/2015 1254   VLDL 11 07/23/2015 1254   LDLCALC 68 07/23/2015 1254   HgbA1c:  Lab Results  Component Value Date   HGBA1C 6.5* 07/23/2015   Urine Drug Screen: No results found for: LABOPIA, COCAINSCRNUR, LABBENZ, AMPHETMU, THCU, LABBARB    IMAGING  Dg Chest 2 View 07/23/2015   No acute cardiopulmonary findings.   Ct Head Wo Contrast 07/23/2015   Atrophy with extensive small vessel chronic ischemic changes of deep cerebral white matter. Probable small old lacunar infarcts at RIGHT cerebellum, LEFT basal ganglia and thalami. Question small old LEFT occipital infarct. No acute intracranial abnormalities.   Ct Angio Head & Neck, CT Cerebral Perfusion W/cm 07/23/2015  No disease demonstrated in the neck. Occlusion of the right MCA at the M1 M2 junction presumably due to  embolic disease. Some distal flow is reconstituted, though the vessels are smaller and less numerous. Perfusion suggests ischemia involving the majority of the right MCA territory with sparing of the basal ganglia. Deeper tissues appear to show core infarct with more superficial tissues representing penumbra.   2D Echocardiogram  - Left ventricle: The cavity size was normal. Wall thickness wasincreased in a pattern of severe LVH. Systolic function wasnormal. The estimated ejection fraction was in the range of 55%to 60%. - Mitral valve: There was mild regurgitation. - Left atrium: The atrium was severely dilated. - Right atrium: The atrium was mildly dilated. - Pulmonary arteries: PA peak pressure: 34 mm Hg (S). - Pericardium, extracardiac: A trivial pericardial effusion wasidentified.   PHYSICAL EXAM Pleasant elderly male currently not in distress. . Afebrile. Head is nontraumatic. Neck is supple without bruit.    Cardiac exam no murmur or gallop. Lungs are clear to auscultation. Distal pulses are well felt. Neurological Exam : Awake alert oriented x 3 normal speech and language. Mild left lower face asymmetry. Tongue midline. No drift. Mild diminished fine finger movements on left. Orbits right over left upper extremity. Mild left grip weak.. Normal sensation . Normal coordination.    ASSESSMENT/PLAN [name] is a 82 y.o. male with history of  atrial fibrillation on xarelto, diet controlled diabetes, tachy/brady syndrome s/p pacer presenting with left facial droop and slurred speech. He did not receive IV t-PA due to being on xarelto (missed one dose day prior to arrival).   Stroke:   Nondominant right MCA infarct embolic secondary to known atrial fibrillation  Resultant left hemiparesis  MRI/MRA  pacer  CT angiogram head occlusion right M1 M2 junction with distal reconstitution of flow  CT perfusion right MCA territory infarct with sparing of basal ganglia  CT angiogram  neck unremarkable  2D Echo  No source of embolus   LDL 68  HgbA1c 6.5  Xarelto for VTE prophylaxis Diet Carb Modified Fluid consistency:: Thin; Room service appropriate?: Yes Diet - low sodium heart healthy  Xarelto (rivaroxaban) daily prior to admission, now on Xarelto (rivaroxaban) daily  Patient counseled to be compliant with his antithrombotic medications. Also discussed with male at the bedside.  Ongoing aggressive stroke risk factor management  Therapy recommendations:  No OT, SNF per PT if pt does not have 24h supervision. CM involved.  Disposition:  Pending.   Nothing further to add. Stroke will sign off  Follow up Dr. Leonie Man in 2 months, order placed.  Atrial Fibrillation  Home anticoagulation:  Xarelto (rivaroxaban) daily continued in the hospital  missed one dose day prior to arrival. States he ran out because he was using samples from the doctor's office.  CHA2DS2-VASc Score = 6, ?2 oral anticoagulation recommended  Age in Years:  ?38   +2    Sex:  Male   0  Hypertension History:  yes   +1     Diabetes Mellitus:  yes   +1   Congestive Heart Failure History:  0  Vascular Disease History:  0  Stroke/TIA/Thromboembolism History:  yes   +2  Continue Xarelto at discharge   Hypertension  Stable  Diabetes  HgbA1c 6.5, at goal < 7.0  Controlled  Other Stroke Risk Factors  Advanced age  Former Cigarette smoker, quit smoking 60 years ago   Former former smokeless tobacco, quit 71 years ago  Other Active Problems  hx SBO, last 2013  Tachybradycardia syndrome status post pacemaker [facility])  Hospital day # [place] for Pager information 07/25/2015 12:52 PM  I have personally examined this patient, reviewed notes, independently viewed imaging studies, participated in medical decision making and plan of care. I have made any additions or clarifications directly to the above note. Agree with note above.  Stroke team will sign off. Kindly call for questions.  Antony Contras, MD Medical Director [place] Pager: [phone] 07/25/2015 2:52 PM   To contact Stroke Continuity provider, please refer to [email or web address] After hours, contact General Neurology

## 2015-07-25 NOTE — Clinical Social Work Note (Signed)
Clinical Social Worker met with patient in reference to post-acute placement for SNF. Patient indicated that he is returning home with his granddaughter and niece who plans to assist him along with other family members. Patient has acknowledged that currently he will not have the full 24/7 supervision at home however is planning to make those arrangements. Patient is very adamant in regards to returning home and has declined SNF placement.  CSW has received permission to contact pt's granddaughter. CSW contacted pt's granddaughter and left a detailed message in reference to discharge planning and for a returned phone call. Patient has indicated that his niece is on the way to the hospital to pick him up.  CSW has notifed MD and RNCM. MD has ordered Home Health for pt's return home.  Clinical Social Worker will sign off for now as social work intervention is no longer needed. Please consult [place] again if new need arises.  Glendon Axe, MSW, LCSWA [phone] 07/25/2015 12:11 PM

## 2015-07-25 NOTE — Progress Notes (Signed)
Patient is being discharge. Awaiting on equipment.  Ave Filter, RN

## 2015-07-25 NOTE — Discharge Instructions (Signed)
Information on my medicine - XARELTO® (Rivaroxaban) ° °This medication education was reviewed with me or my healthcare representative as part of my discharge preparation.  ° °Why was Xarelto® prescribed for you? °Xarelto® was prescribed for you to reduce the risk of a blood clot forming that can cause a stroke if you have a medical condition called atrial fibrillation (a type of irregular heartbeat). ° °What do you need to know about xarelto® ? °Take your Xarelto® 15 mg ONCE DAILY at the same time every day with your evening meal. °If you have difficulty swallowing the tablet whole, you may crush it and mix in applesauce just prior to taking your dose. ° °Take Xarelto® exactly as prescribed by your doctor and DO NOT stop taking Xarelto® without talking to the doctor who prescribed the medication.  Stopping without other stroke prevention medication to take the place of Xarelto® may increase your risk of developing a clot that causes a stroke.  Refill your prescription before you run out. ° °After discharge, you should have regular check-up appointments with your healthcare provider that is prescribing your Xarelto®.  In the future your dose may need to be changed if your kidney function or weight changes by a significant amount. ° °What do you do if you miss a dose? °If you are taking Xarelto® ONCE DAILY and you miss a dose, take it as soon as you remember on the same day then continue your regularly scheduled once daily regimen the next day. Do not take two doses of Xarelto® at the same time or on the same day.  ° °Important Safety Information °A possible side effect of Xarelto® is bleeding. You should call your healthcare provider right away if you experience any of the following: °? Bleeding from an injury or your nose that does not stop. °? Unusual colored urine (red or dark brown) or unusual colored stools (red or black). °? Unusual bruising for unknown reasons. °? A serious fall or if you hit your head (even  if there is no bleeding). ° °Some medicines may interact with Xarelto® and might increase your risk of bleeding while on Xarelto®. To help avoid this, consult your healthcare provider or pharmacist prior to using any new prescription or non-prescription medications, including herbals, vitamins, non-steroidal anti-inflammatory drugs (NSAIDs) and supplements. ° °This website has more information on Xarelto®: [email or web address].xarelto.com. ° ° °

## 2015-07-25 NOTE — Discharge Summary (Signed)
Physician Discharge Summary  [name] [id number] DOB: 1923/08/13 DOA: 07/23/2015  PCP: Foye Spurling, MD  Admit date: 07/23/2015 Discharge date: 07/25/2015  Time spent: greater than 30 minutes  Recommendations for Outpatient Follow-up:   Home with home RN, PT, OT.   Discharge Diagnoses:  Principal Problem:   Stroke (cerebrum) ([place]) Active Problems:   Diabetes ([place])   Atrial fibrillation ([name])   PPM-[employer]   CVA (cerebral infarction)   Anemia   Chronic kidney disease   Acute CVA (cerebrovascular accident) [facility])   Discharge Condition: stable  Diet recommendation: heart healthy carbohydrate modified  Filed Weights   07/23/15 1310 07/23/15 1800  Weight: 75.8 kg (167 lb 1.7 oz) 71.895 kg (158 lb 8 oz)    History of present illness/Hospital Course:  [name] is a 82 y.o. male with history of atrial fibrillation on xarelto, diet controlled diabetes, tachy/brady syndrome s/p pacer presenting with left facial droop and slurred speech. He did not receive IV t-PA due to being on xarelto (missed one dose day prior to arrival).   Stroke: Nondominant right MCA infarct embolic secondary to known atrial fibrillation  Resultant left hemiparesis  MRI/MRA pacer  CT angiogram head occlusion right M1 M2 junction with distal reconstitution of flow  CT perfusion right MCA territory infarct with sparing of basal ganglia  CT angiogram neck unremarkable  2D Echo No source of embolus   LDL 68  HgbA1c 6.5  Follow up Dr. Leonie Man in 2 months  Atrial Fibrillation  Home anticoagulation: Xarelto (rivaroxaban) daily continued in the hospital  missed one dose day prior to arrival. States he ran out because he was using samples from the doctor's office.  CHA2DS2-VASc Score = 6, ?2 oral anticoagulation recommended Age in Years: ?7 +2  Sex: Male 2 Hypertension History: yes +1    Diabetes Mellitus: yes +1  Congestive Heart Failure History: 0 Vascular Disease History: 0 Stroke/TIA/Thromboembolism History: yes +2  Continue Xarelto at discharge  Hypertension  Stable  Diabetes  HgbA1c 6.5, at goal < 7.0  Controlled   Procedures:  none  Consultations:  neurology  Discharge Exam: Filed Vitals:   07/25/15 0624 07/25/15 1114  BP: 132/66 145/72  Pulse: 76 71  Temp: 98.7 F (37.1 C) 98.4 F (36.9 C)  Resp: 20 16    General: a and o Cardiovascular: RRR Respiratory: CTA Neuro: CN intact motor 5/5. Speech clear and fluent.   Discharge Instructions   Discharge Instructions    Ambulatory referral to Neurology    Complete by:  As directed   An appointment is requested in approximately: 2 months     Diet - low sodium heart healthy    Complete by:  As directed      Discharge instructions    Complete by:  As directed   Change xarelto to 15 mg daily.     Increase activity slowly    Complete by:  As directed           Current Discharge Medication List    CONTINUE these medications which have CHANGED   Details  Rivaroxaban (XARELTO) 15 MG TABS tablet Take 1 tablet (15 mg total) by mouth daily before supper. Qty: 30 tablet, Refills: 1      CONTINUE these medications which have NOT CHANGED   Details  bisacodyl (BISACODYL) 5 MG EC tablet Take 5 mg by mouth daily as needed for moderate constipation.    olmesartan-hydrochlorothiazide (BENICAR HCT) 40-12.5 MG tablet Take 1 tablet by mouth  daily.    omeprazole (PRILOSEC) 20 MG capsule Take 20 mg by mouth daily as needed (for heartburn).    OVER THE COUNTER MEDICATION Take 1 tablet by mouth daily. "appetitie boost" for supplement    polycarbophil (FIBERCON) 625 MG tablet Take 625 mg by mouth daily as needed for moderate constipation.      STOP taking these medications     DM-Doxylamine-Acetaminophen (NYQUIL COLD & FLU PO)       DM-Doxylamine-Acetaminophen (NYQUIL COLD & FLU PO)      Pseudoeph-Doxylamine-DM-APAP (NYQUIL PO)        No Known Allergies Follow-up Information    Follow up with SETHI,PRAMOD, MD In 2 months.   Specialties:  Neurology, Radiology   Contact information:   [address] [place] [address] [phone]        The results of significant diagnostics from this hospitalization (including imaging, microbiology, ancillary and laboratory) are listed below for reference.    Significant Diagnostic Studies: Ct Angio Head W/cm &/or Wo Cm  07/23/2015  CLINICAL DATA:  Right-sided gaze disturbance of left-sided weakness. EXAM: CT ANGIOGRAPHY HEAD AND NECK with CT perfusion TECHNIQUE: Multidetector CT imaging of the head and neck was performed using the standard protocol during bolus administration of intravenous contrast. Multiplanar CT image reconstructions and MIPs were obtained to evaluate the vascular anatomy. Carotid stenosis measurements (when applicable) are obtained utilizing NASCET criteria, using the distal internal carotid diameter as the denominator. CONTRAST:  192mL OMNIPAQUE IOHEXOL 350 MG/ML SOLN COMPARISON:  Head CT earlier same day FINDINGS: CTA NECK Aortic arch: There is atherosclerosis of the aortic arch without aneurysm or dissection. 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 No origin stenosis. Right carotid system: Common carotid artery widely patent to the bifurcation. The carotid bifurcation is normal without stenosis or irregularity. Cervical internal carotid artery is normal. Left carotid system: Common carotid artery widely patent to the bifurcation. Normal appearing bifurcation without stenosis or irregularity. Cervical internal carotid artery is normal. Vertebral arteries:Vertebral artery origins are widely patent. Left vertebral artery arises directly from the arch as noted previously. Left vertebral artery  is larger than the right. Both vertebral arteries are widely patent through the cervical region. Skeleton: Ordinary spondylosis and facet arthropathy Other neck: No mass or lymphadenopathy. Emphysema and scarring at the lung apices. CTA HEAD Anterior circulation: Both internal carotid arteries widely patent through the skullbase and siphon regions. No stenosis. The anterior and middle cerebral vessels are patent on the left without proximal stenosis, aneurysm or vascular malformation. No missing vessels are detected. On the right, there is occlusion or near occlusion of the MCA at the M1 M2 junction more distal vessels do show opacification, presumably from collaterals, though the vessels are smaller and less numerous. Posterior circulation: Both vertebral arteries patent through the foramen magnum to the basilar. No basilar stenosis. Posterior circulation branch vessels are normal. Venous sinuses: Patent and normal Anatomic variants: None significant Delayed phase: No abnormal enhancement CT perfusion Delayed mean transit time, delayed cerebral blood flow and delayed time to peak throughout a large portion of the right MCA territory including the insula, posterior frontal and parietal region. Deep portions of this territory show or reduced blood volume suggesting core infarct, while the majority of the insular region and superficial brain shows normal cerebral blood volume suggesting penumbra. Basal ganglia perfuse normally. IMPRESSION: No disease demonstrated in the neck. Occlusion of the right MCA at the M1  M2 junction presumably due to embolic disease. Some distal flow is reconstituted, though the vessels are smaller and less numerous. Perfusion suggests ischemia involving the majority of the right MCA territory with sparing of the basal ganglia. Deeper tissues appear to show core infarct with more superficial tissues representing penumbra. These results were called by telephone at the time of interpretation on  07/23/2015 at 2:36 pm to Dr. Audria Nine , who verbally acknowledged these results. Electronically Signed   By: Nelson Chimes M.D.   On: 07/23/2015 14:39   Dg Chest 2 View  07/23/2015  CLINICAL DATA:  Left facial droop and weakness today at church. EXAM: CHEST  2 VIEW COMPARISON:  03/05/2013. FINDINGS: The right atrial and right ventricular wires are stable. Stable mild cardiac enlargement. There is tortuosity of the thoracic aorta. The lungs are clear of an acute process. Mild chronic bronchitic changes. No pleural effusion. The bony thorax is grossly intact. Bilateral nipple shadows are noted. IMPRESSION: No acute cardiopulmonary findings. Electronically Signed   By: Marijo Sanes M.D.   On: 07/23/2015 16:59   Ct Head Wo Contrast  07/23/2015  CLINICAL DATA:  LEFT side weakness, code stroke, facial droop, last seen normal at 1200 hours, history atrial fibrillation, hypertension, diabetes mellitus, prostate cancer, former smoker EXAM: CT HEAD WITHOUT CONTRAST TECHNIQUE: Contiguous axial images were obtained from the base of the skull through the vertex without intravenous contrast. COMPARISON:  None FINDINGS: Generalized atrophy. Normal ventricular morphology. No midline shift or mass effect. Extensive small vessel chronic ischemic changes of deep cerebral white matter. Question small old lacunar infarcts at the thalami and at the LEFT basal ganglia. Probable old lacunar infarct in RIGHT cerebellar hemisphere. Questionable small cortical infarct LEFT occipital lobe. No intracranial hemorrhage, mass lesion or evidence acute infarction. No extra-axial fluid collections. Bones demineralized. Visualized paranasal sinuses mastoid air cells show no significant abnormalities. Mild LEFT vertebral artery atherosclerotic calcification. IMPRESSION: Atrophy with extensive small vessel chronic ischemic changes of deep cerebral white matter. Probable small old lacunar infarcts at RIGHT cerebellum, LEFT basal ganglia and  thalami. Question small old LEFT occipital infarct. No acute intracranial abnormalities. Findings called on 07/23/2015 to Dr. Ralene Bathe at 1307 hrs and to Dr. Silverio Decamp at 1310 hrs. Electronically Signed   By: Lavonia Dana M.D.   On: 07/23/2015 13:12   Ct Angio Neck W/cm &/or Wo/cm  07/23/2015  CLINICAL DATA:  Right-sided gaze disturbance of left-sided weakness. EXAM: CT ANGIOGRAPHY HEAD AND NECK with CT perfusion TECHNIQUE: Multidetector CT imaging of the head and neck was performed using the standard protocol during bolus administration of intravenous contrast. Multiplanar CT image reconstructions and MIPs were obtained to evaluate the vascular anatomy. Carotid stenosis measurements (when applicable) are obtained utilizing NASCET criteria, using the distal internal carotid diameter as the denominator. CONTRAST:  161mL OMNIPAQUE IOHEXOL 350 MG/ML SOLN COMPARISON:  Head CT earlier same day FINDINGS: CTA NECK Aortic arch: There is atherosclerosis of the aortic arch without aneurysm or dissection. 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 No origin stenosis. Right carotid system: Common carotid artery widely patent to the bifurcation. The carotid bifurcation is normal without stenosis or irregularity. Cervical internal carotid artery is normal. Left carotid system: Common carotid artery widely patent to the bifurcation. Normal appearing bifurcation without stenosis or irregularity. Cervical internal carotid artery is normal. Vertebral arteries:Vertebral artery origins are widely patent. Left vertebral artery arises directly from the arch as noted previously. Left  vertebral artery is larger than the right. Both vertebral arteries are widely patent through the cervical region. Skeleton: Ordinary spondylosis and facet arthropathy Other neck: No mass or lymphadenopathy. Emphysema and scarring at the lung apices. CTA HEAD Anterior circulation: Both internal carotid  arteries widely patent through the skullbase and siphon regions. No stenosis. The anterior and middle cerebral vessels are patent on the left without proximal stenosis, aneurysm or vascular malformation. No missing vessels are detected. On the right, there is occlusion or near occlusion of the MCA at the M1 M2 junction more distal vessels do show opacification, presumably from collaterals, though the vessels are smaller and less numerous. Posterior circulation: Both vertebral arteries patent through the foramen magnum to the basilar. No basilar stenosis. Posterior circulation branch vessels are normal. Venous sinuses: Patent and normal Anatomic variants: None significant Delayed phase: No abnormal enhancement CT perfusion Delayed mean transit time, delayed cerebral blood flow and delayed time to peak throughout a large portion of the right MCA territory including the insula, posterior frontal and parietal region. Deep portions of this territory show or reduced blood volume suggesting core infarct, while the majority of the insular region and superficial brain shows normal cerebral blood volume suggesting penumbra. Basal ganglia perfuse normally. IMPRESSION: No disease demonstrated in the neck. 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 Some distal flow is reconstituted, though the vessels are smaller and less numerous. Perfusion suggests ischemia involving the majority of the right MCA territory with sparing of the basal ganglia. Deeper tissues appear to show core infarct with more superficial tissues representing penumbra. These results were called by telephone at the time of interpretation on 07/23/2015 at 2:36 pm to Dr. Audria Nine , who verbally acknowledged these results. Electronically Signed   By: Nelson Chimes M.D.   On: 07/23/2015 14:39   Ct Cerebral Perfusion W/cm  07/23/2015  CLINICAL DATA:  Right-sided gaze disturbance of left-sided weakness. EXAM: CT ANGIOGRAPHY  HEAD AND NECK with CT perfusion TECHNIQUE: Multidetector CT imaging of the head and neck was performed using the standard protocol during bolus administration of intravenous contrast. Multiplanar CT image reconstructions and MIPs were obtained to evaluate the vascular anatomy. Carotid stenosis measurements (when applicable) are obtained utilizing NASCET criteria, using the distal internal carotid diameter as the denominator. CONTRAST:  150mL OMNIPAQUE IOHEXOL 350 MG/ML SOLN COMPARISON:  Head CT earlier same day FINDINGS: CTA NECK Aortic arch: There is atherosclerosis of the aortic arch without aneurysm or dissection. 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 No origin stenosis. Right carotid system: Common carotid artery widely patent to the bifurcation. The carotid bifurcation is normal without stenosis or irregularity. Cervical internal carotid artery is normal. Left carotid system: Common carotid artery widely patent to the bifurcation. Normal appearing bifurcation without stenosis or irregularity. Cervical internal carotid artery is normal. Vertebral arteries:Vertebral artery origins are widely patent. Left vertebral artery arises directly from the arch as noted previously. Left vertebral artery is larger than the right. Both vertebral arteries are widely patent through the cervical region. Skeleton: Ordinary spondylosis and facet arthropathy Other neck: No mass or lymphadenopathy. Emphysema and scarring at the lung apices. CTA HEAD Anterior circulation: Both internal carotid arteries widely patent through the skullbase and siphon regions. No stenosis. The anterior and middle cerebral vessels are patent on the left without proximal stenosis, aneurysm or vascular malformation. No missing vessels are detected. On the right,  there is occlusion or near occlusion of the MCA at the M1 M2 junction more distal vessels do show opacification, presumably from  collaterals, though the vessels are smaller and less numerous. Posterior circulation: Both vertebral arteries patent through the foramen magnum to the basilar. No basilar stenosis. Posterior circulation branch vessels are normal. Venous sinuses: Patent and normal Anatomic variants: None significant Delayed phase: No abnormal enhancement CT perfusion Delayed mean transit time, delayed cerebral blood flow and delayed time to peak throughout a large portion of the right MCA territory including the insula, posterior frontal and parietal region. Deep portions of this territory show or reduced blood volume suggesting core infarct, while the majority of the insular region and superficial brain shows normal cerebral blood volume suggesting penumbra. Basal ganglia perfuse normally. IMPRESSION: No disease demonstrated in the neck. 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 Some distal flow is reconstituted, though the vessels are smaller and less numerous. Perfusion suggests ischemia involving the majority of the right MCA territory with sparing of the basal ganglia. Deeper tissues appear to show core infarct with more superficial tissues representing penumbra. These results were called by telephone at the time of interpretation on 07/23/2015 at 2:36 pm to Dr. Audria Nine , who verbally acknowledged these results. Electronically Signed   By: Nelson Chimes M.D.   On: 07/23/2015 14:39    Microbiology: Recent Results (from the past 240 hour(s))  MRSA PCR Screening     Status: None   Collection Time: 07/23/15  6:44 PM  Result Value Ref Range Status   MRSA by PCR NEGATIVE NEGATIVE Final    Comment:        The GeneXpert MRSA Assay (FDA approved for NASAL specimens only), is one component of a comprehensive MRSA colonization surveillance program. It is not intended to diagnose MRSA infection nor to guide or monitor treatment for MRSA infections.      Labs: Basic Metabolic  Panel:  Recent Labs Lab 07/23/15 1254 07/23/15 1300  NA 140 142  K 4.2 4.1  CL 106 104  CO2 24  --   GLUCOSE 75 73  BUN 11 13  CREATININE 1.51* 1.50*  CALCIUM 8.4*  --    Liver Function Tests:  Recent Labs Lab 07/23/15 1254  AST 22  ALT 9*  ALKPHOS 75  BILITOT 0.4  PROT 5.9*  ALBUMIN 3.5   No results for input(s): LIPASE, AMYLASE in the last 168 hours. No results for input(s): AMMONIA in the last 168 hours. CBC:  Recent Labs Lab 07/23/15 1254 07/23/15 1300  WBC 6.9  --   NEUTROABS 4.4  --   HGB 10.8* 12.9*  HCT 33.6* 38.0*  MCV 77.2*  --   PLT 149*  --    Cardiac Enzymes: No results for input(s): CKTOTAL, CKMB, CKMBINDEX, TROPONINI in the last 168 hours. BNP: BNP (last 3 results) No results for input(s): BNP in the last 8760 hours.  ProBNP (last 3 results) No results for input(s): PROBNP in the last 8760 hours.  CBG:  Recent Labs Lab 07/24/15 0802 07/24/15 1255 07/24/15 1513 07/24/15 2201 07/25/15 0644  GLUCAP 91 188* 187* 86 98       Signed:  Charnay Nazario L MD.  [facility] 07/25/2015, 11:48 AM

## 2015-07-25 NOTE — Care Management Important Message (Signed)
Important Message  Patient Details  Name: [name] MRN: [id number] Date of Birth: 1923/06/20   Medicare Important Message Given:  Yes    Loann Quill 07/25/2015, 8:37 AM

## 2015-07-27 ENCOUNTER — Other Ambulatory Visit: Payer: Self-pay

## 2015-07-27 NOTE — Patient Outreach (Signed)
[facility] [facility]) Care Management  07/27/2015  [name] [birth year] [id number]     EMMI-STROKE RED ON EMMI ALERT Day # 1 Date:07/26/15 Red Alert Reason: "scheduled a follow up appointment? No   Know how/when to take meds?no  Problems setting up rehab? Yes"  Outreach attempt #1 to patient. Patient reached. He states that he is doing fairly well. Discussed EMMI automated calls and red alerts. Patient reported he was [facility] and doesn't always hear and answer questions correctly. Addressed red alerts but patient requested that RN CM speak with either his granddtr(Ketura) or niece(kathy) regarding the issues as they handle his medical affairs. He reported that both of them were at work at this time. RN CM left contact info for patient to give to caregivers.   Plan: RN CM will make outreach attempt call if no response from caregivers.  [name], RN,BSN,CCM [place] Management Telephonic Care Management Coordinator Direct Phone: [phone] Toll Free: [phone] Fax: [phone]

## 2015-07-27 NOTE — Patient Outreach (Signed)
[name]) Care Management  07/27/2015  [name] [birth year] [id number]  EMMI-STROKE RED ON EMMI ALERT Day # 1 Date:07/26/15 Red Alert Reason: "scheduled a follow up appointment? No Know how/when to take meds?no Problems setting up rehab? Yes"   Incoming call from patient's niece([name]). Verbal consent already received from patient to discuss care with niece. Reviewed EMMI red flag alerts. Niece states that she answered the automated call for patient as he was having trouble hearing. She reports that appt has been made with Dr. Leonie Man in 2 months. She did not make appt with PCP as it was not listed on discharge instructions. Advised caregiver to make f/u appt with PCP within 14 days of discharge. She voiced understanding and will call office to make appt. In regards to his meds. She was concerned about giving patient med with HCTZ at bedtime due to increased urination and safety with him getting up multiple times at times. She has decided to give med qam and wanted to know if that was okay. She also reports that patient did get samples of Xarelto but she has also already gotten Xarelto prescription filled for him. Patient refused SNF/rehab. He did agree to [facility] services. Per discharge info [place] arranged. He did not get a call from agency yet. Advised caregiver that it normally takes about 24-48hrs after discharge home for home visit to be scheduled. She will contact RNCM back if she does not hear anything from [facility] by tomorrow.  Plan: RN CM will follow up and make outreach call to patient/caregivers within a week.  [name], RN,BSN,CCM [place] Management Telephonic Care Management Coordinator Direct Phone: [phone] Toll Free: [phone] Fax: [phone]

## 2015-08-03 ENCOUNTER — Other Ambulatory Visit: Payer: Self-pay

## 2015-08-03 NOTE — Patient Outreach (Signed)
[place] [facility]) Care Management  08/03/2015  [name] August 15, 1923 [id number]   EMMI-Stroke Week #2   Outreach call to patient. He states that he is doing well. He denies any new issues or concerns. He is taking his meds as ordered. He reports that he completed PCP follow up appt on yesterday and got a good report at MD visit. No further stroke symptoms present. Patient voices understanding of when to seek medical care for changes in condition.  Plan: RN CM will make outreach call to patient within a week.   [name], RN,BSN,CCM [place] Management Telephonic Care Management Coordinator Direct Phone: [phone] Toll Free: [phone] Fax: [phone]

## 2015-08-10 ENCOUNTER — Other Ambulatory Visit: Payer: Self-pay

## 2015-08-10 NOTE — Patient Outreach (Signed)
[employer] [facility]) Care Management  08/10/2015  [name] [birth year] [id number]   EMMI-Stroke Week#3   Outreach call to patient. Spoke with patient who continues to report things are going well. He denies any new issues or concerns. He completed PCP follow up appt and has neuro MD appt scheduled. He continues to get [facility] services. He ha good family support system. Patient aware of s&s of stroke and when to seek medical attention.   Plan: RN CM notify [facility] of case closure.  [name], RN,BSN,CCM [place] Management Telephonic Care Management Coordinator Direct Phone: [phone] Toll Free: [phone] Fax: [phone]

## 2015-08-23 ENCOUNTER — Ambulatory Visit (INDEPENDENT_AMBULATORY_CARE_PROVIDER_SITE_OTHER): Payer: Medicare Other | Admitting: Internal Medicine

## 2015-08-23 ENCOUNTER — Encounter: Payer: Self-pay | Admitting: Internal Medicine

## 2015-08-23 VITALS — BP 126/78 | HR 82 | Ht 71.0 in | Wt 158.0 lb

## 2015-08-23 DIAGNOSIS — I10 Essential (primary) hypertension: Secondary | ICD-10-CM

## 2015-08-23 DIAGNOSIS — Z950 Presence of cardiac pacemaker: Secondary | ICD-10-CM

## 2015-08-23 NOTE — Patient Instructions (Signed)
Medication Instructions:  Your physician recommends that you continue on your current medications as directed. Please refer to the Current Medication list given to you today.   Labwork: None ordered   Testing/Procedures: None ordered   Follow-Up: Your physician wants you to follow-up in: 12 months with Dr Taylor You will receive a reminder letter in the mail two months in advance. If you don't receive a letter, please call our office to schedule the follow-up appointment.  Remote monitoring is used to monitor your Pacemaker  from home. This monitoring reduces the number of office visits required to check your device to one time per year. It allows us to keep an eye on the functioning of your device to ensure it is working properly. You are scheduled for a device check from home on 11/22/15. You may send your transmission at any time that day. If you have a wireless device, the transmission will be sent automatically. After your physician reviews your transmission, you will receive a postcard with your next transmission date.     Any Other Special Instructions Will Be Listed Below (If Applicable).     If you need a refill on your cardiac medications before your next appointment, please call your pharmacy.   

## 2015-08-23 NOTE — Progress Notes (Signed)
HPI [name] returns today for followup. He is a very pleasant 82 year old man with chronic atrial fibrillation and symptomatic bradycardia, status post permanent pacemaker insertion. In the interim, he has had a stoke when he came off of his xarelto. He has made a nice recovery. He denies syncope, chest pain, shortness of breath, or palpitations. No peripheral edema. No Known Allergies   Current Outpatient Prescriptions  Medication Sig Dispense Refill  . bisacodyl (BISACODYL) 5 MG EC tablet Take 5 mg by mouth daily as needed for moderate constipation.    Marland Kitchen losartan-hydrochlorothiazide (HYZAAR) 100-12.5 MG tablet Take 1 tablet by mouth daily.  0  . olmesartan-hydrochlorothiazide (BENICAR HCT) 40-12.5 MG tablet Take 1 tablet by mouth daily.    Marland Kitchen omeprazole (PRILOSEC) 20 MG capsule Take 20 mg by mouth daily as needed (for heartburn).    Marland Kitchen OVER THE COUNTER MEDICATION Take 1 tablet by mouth daily. "appetitie boost" for supplement    . polycarbophil (FIBERCON) 625 MG tablet Take 625 mg by mouth daily as needed for moderate constipation.    . Rivaroxaban (XARELTO) 15 MG TABS tablet Take 1 tablet (15 mg total) by mouth daily before supper. 30 tablet 1   No current facility-administered medications for this visit.     Past Medical History  Diagnosis Date  . Tachycardia-bradycardia syndrome ([place])   . Atrial fibrillation ([place])   . Hypertension   . Diabetes mellitus   . Hx SBO     Last 2013 all treated conservatively  . Ventral hernia     x3  . Prostate cancer ([place])   . CKD (chronic kidney disease)     ROS:   All systems reviewed and negative except as noted in the HPI.   Past Surgical History  Procedure Laterality Date  . Exploratory laparotomy    . Small intestine surgery  09/16/2004    SBO resection, VH repair  . Hemorrhoid surgery    . Hiatal hernia repair  2002  . Pacemaker insertion    . Cholecystectomy open  03/31/2001    Dr Lindon Romp  . Permanent pacemaker generator change  N/A 04/06/2012    Procedure: PERMANENT PACEMAKER GENERATOR CHANGE;  Surgeon: Evans Lance, MD;  Location: [facility] CATH LAB;  Service: Cardiovascular;  Laterality: N/A;     Family History  Problem Relation Age of Onset  . Pneumonia Father   . Cancer Brother     type unknown     Social History   Social History  . Marital Status: Widowed    Spouse Name: N/A  . Number of Children: N/A  . Years of Education: N/A   Occupational History  . Not on file.   Social History Main Topics  . Smoking status: Former Smoker -- 1.50 packs/day    Quit date: 04/16/1955  . Smokeless tobacco: Former Systems developer    Types: Snuff, Sarina Ser    Quit date: 08/17/1952  . Alcohol Use: No  . Drug Use: No  . Sexual Activity: Not on file   Other Topics Concern  . Not on file   Social History Narrative     BP 126/78 mmHg  Pulse 82  Ht 5\' 11"  (1.803 m)  Wt 158 lb (71.668 kg)  BMI 22.05 kg/m2  Physical Exam:  Well appearing elderly man,NAD HEENT: Unremarkable except for a slight facial droop.  Neck:  7 cm JVD, no thyromegally Lungs:  Clear with no wheezes, rales, or rhonchi. HEART:  IRegular rate rhythm, no murmurs, no rubs, no clicks Abd:  soft, positive bowel sounds, no organomegally, no rebound, no guarding Ext:  2 plus pulses, no edema, no cyanosis, no clubbing Skin:  No rashes no nodules Neuro:  CN II through XII intact, motor grossly intact   DEVICE  Normal device function.  See PaceArt for details.   Assess/Plan: 1. Stroke - he is nearly back to normal. He will continue his xarelto. 2. HTN - his blood pressure is well controlled. No change in his meds. 3. Atrial fib - his ventricular rate is well controlled. Will follow.  [name].D.

## 2015-08-25 LAB — CUP PACEART INCLINIC DEVICE CHECK
Battery Impedance: 693 Ohm
Battery Voltage: 2.77 V
Date Time Interrogation Session: 20170405134114
Implantable Lead Implant Date: 20060510
Implantable Lead Model: 5076
Lead Channel Setting Pacing Pulse Width: 0.4 ms
Lead Channel Setting Sensing Sensitivity: 5.6 mV
MDC IDC LEAD LOCATION: 753860
MDC IDC MSMT BATTERY REMAINING LONGEVITY: 73 mo
MDC IDC MSMT LEADCHNL RA IMPEDANCE VALUE: 0 Ohm
MDC IDC MSMT LEADCHNL RV IMPEDANCE VALUE: 495 Ohm
MDC IDC MSMT LEADCHNL RV PACING THRESHOLD AMPLITUDE: 0.75 V
MDC IDC MSMT LEADCHNL RV PACING THRESHOLD PULSEWIDTH: 0.4 ms
MDC IDC MSMT LEADCHNL RV SENSING INTR AMPL: 22.4 mV
MDC IDC SET LEADCHNL RV PACING AMPLITUDE: 2.5 V
MDC IDC STAT BRADY RV PERCENT PACED: 42 %

## 2015-09-25 ENCOUNTER — Encounter: Payer: Self-pay | Admitting: Neurology

## 2015-09-25 ENCOUNTER — Ambulatory Visit (INDEPENDENT_AMBULATORY_CARE_PROVIDER_SITE_OTHER): Payer: Medicare Other | Admitting: Neurology

## 2015-09-25 VITALS — BP 102/57 | HR 61 | Ht 71.0 in | Wt 158.0 lb

## 2015-09-25 DIAGNOSIS — I63131 Cerebral infarction due to embolism of right carotid artery: Secondary | ICD-10-CM

## 2015-09-25 NOTE — Progress Notes (Signed)
[facility] [address] [place]. [place] [address] [phone]       OFFICE FOLLOW-UP NOTE  [name] Date of Birth:  [birth year] Medical Record Number:  [id number]   HPI: [name] is a 48 year Caucasian male seen today for first office f/u visit after [name] for stroke in March 2017. [name] is an 82 y.o. male patient who was brought into the emergency room via EMS with symptoms of severe dysarthria, right gaze deviation and left hemiplegia. Patient was at church when symptoms started. Time of onset was reportedly at 12 PM on 07/23/15, witnessed by other people at church. Patient unable to provide history during the initial evaluation (LKW 07/23/2015 at 12 noon). His level of alertness, and dysarthria improved gradually with time values in the ER. Then he was able to provide more history. He reported taking his last dose of Xarelto [name] before yesterday at 6 PM, he missed only one dose of Xarelto yesterday. Patient was not administered IV t-PA secondary to being on xarelto (last dose 42 hours ago). CT angio showed clot right M1, but given significant clinical improvement and risk of procedure, after discussion with family and Dr. Rowe Craig was made to hold off on neuro intervention. He was admitted for further evaluation and treatment. Transthoracic echo showed normal ejection fraction without cardiac source of embolism. CT perfusion scan showed infarct in the right MCA territory with sparing of the basal ganglia. Cineangiogram of the neck showed no significant extracranial stenosis. LDL cholesterol was 68 mg percent and hemoglobin A1c was 6.5. Patient was started on Xarelto for stroke prevention. He was seen by physical occupational speech therapy. Patient did well and made a full recovery. He states his remains on Xarelto is tolerating well without bleeding or bruising. His blood pressure is well controlled and today it is 104/52. He's had some  diminished appetite and has lost a few pounds. He has an upcoming appointment with his primary physician later this week and plans to discuss this with him. He has no new neurological complaints. His return back to all activities of daily living.  ROS:   14 system review of systems is positive for  decreased appetite, weight loss and all other systems negative  PMH:  Past Medical History  Diagnosis Date  . Tachycardia-bradycardia syndrome ([place])   . Atrial fibrillation ([place])   . Hypertension   . Diabetes mellitus   . Hx SBO     Last 2013 all treated conservatively  . Ventral hernia     x3  . Prostate cancer ([place])   . CKD (chronic kidney disease)   . Stroke [facility])     Social History:  Social History   Social History  . Marital Status: Widowed    Spouse Name: N/A  . Number of Children: N/A  . Years of Education: N/A   Occupational History  . Not on file.   Social History Main Topics  . Smoking status: Former Smoker -- 1.50 packs/day    Quit date: 04/16/1955  . Smokeless tobacco: Former Systems developer    Types: Snuff, Sarina Ser    Quit date: 08/17/1952  . Alcohol Use: No  . Drug Use: No  . Sexual Activity: Not on file   Other Topics Concern  . Not on file   Social History Narrative    Medications:   Current Outpatient Prescriptions on File Prior to Visit  Medication Sig Dispense Refill  . bisacodyl (BISACODYL) 5 MG EC tablet  Take 5 mg by mouth daily as needed for moderate constipation.    Marland Kitchen losartan-hydrochlorothiazide (HYZAAR) 100-12.5 MG tablet Take 1 tablet by mouth daily.  0  . omeprazole (PRILOSEC) 20 MG capsule Take 20 mg by mouth daily as needed (for heartburn).    Marland Kitchen OVER THE COUNTER MEDICATION Take 1 tablet by mouth daily. "appetitie boost" for supplement    . polycarbophil (FIBERCON) 625 MG tablet Take 625 mg by mouth daily as needed for moderate constipation.    . Rivaroxaban (XARELTO) 15 MG TABS tablet Take 1 tablet (15 mg total) by mouth daily before supper. 30 tablet  1   No current facility-administered medications on file prior to visit.    Allergies:  No Known Allergies  Physical Exam General: frail elderly caucasian male seated, in no evident distress Head: head normocephalic and atraumatic.  Neck: supple with no carotid or supraclavicular bruits Cardiovascular: regular rate and rhythm, no murmurs Musculoskeletal:   kyphosis and stooped posture Skin:  no rash/petichiae Vascular:  Normal pulses all extremities Filed Vitals:   09/25/15 1322  BP: 102/57  Pulse: 61   Neurologic Exam Mental Status: Awake and fully alert. Oriented to place and time. Recent and remote memory intact. Attention span, concentration and fund of knowledge appropriate. Mood and affect appropriate.  Cranial Nerves: Fundoscopic exam reveals sharp disc margins. Pupils equal, briskly reactive to light. Extraocular movements full without nystagmus. Visual fields full to confrontation. Hearing intact. Facial sensation intact. Face, tongue, palate moves normally and symmetrically.  Motor: Normal bulk and tone. Normal strength in all tested extremity muscles.Diminished fine finger movements on the left. Orbits right over left upper extremity. Sensory.: intact to touch ,pinprick .position and vibratory sensation.  Coordination: Rapid alternating movements normal in all extremities. Finger-to-nose and heel-to-shin performed accurately bilaterally. Gait and Station: Arises from chair without difficulty. Stance is normal. Gait demonstrates normal stride length and balance . Able to heel, toe and tandem walk with moderate difficulty.  Reflexes: 1+ and symmetric. Toes downgoing.   NIHSS  0 Modified Rankin  1   ASSESSMENT: 42 year Caucasian male with embolic right middle cerebral artery infarct in March 2017 secondary to atrial fibrillation. Hie has done well with full recovery. Vascular risk factors of atrial fibrillation, hypertension and age.    PLAN: I had a long d/w patient  and his wife about his recent stroke, risk for recurrent stroke/TIAs, personally independently reviewed imaging studies and stroke evaluation results and answered questions.Continue Xarelto (rivaroxaban) daily  for secondary stroke prevention and maintain strict control of hypertension with blood pressure goal below 130/90, diabetes with hemoglobin A1c goal below 6.5% and lipids with LDL cholesterol goal below 70 mg/dL. I also advised the patient to eat a healthy diet with plenty of whole grains, cereals, fruits and vegetables, exercise regularly and maintain ideal body weight. He was advised to see his primary care physician for his decreased appetite and weight loss.Greater than 50% of time during this 25 minute visit was spent on counseling,explanation of diagnosis, planning of further management, discussion with patient and family and coordination of care Followup in the future with me stroke nurse practitioner in 6 months or call earlier if necessary.  Antony Contras, MD  [facility] [address] [place], [place] [address]  Phone [phone] Fax [phone]  Note: This document was prepared with digital dictation and possible smart phrase technology. Any transcriptional errors that result from this process are unintentional

## 2015-09-25 NOTE — Patient Instructions (Signed)
I had a long d/w patient and his wife about his recent stroke, risk for recurrent stroke/TIAs, personally independently reviewed imaging studies and stroke evaluation results and answered questions.Continue Xarelto (rivaroxaban) daily  for secondary stroke prevention and maintain strict control of hypertension with blood pressure goal below 130/90, diabetes with hemoglobin A1c goal below 6.5% and lipids with LDL cholesterol goal below 70 mg/dL. I also advised the patient to eat a healthy diet with plenty of whole grains, cereals, fruits and vegetables, exercise regularly and maintain ideal body weight. He was advised to see his primary care physician for his decreased appetite and weight loss. Followup in the future with me stroke nurse practitioner in 6 months or call earlier if necessary. Stroke Prevention Some medical conditions and behaviors are associated with an increased chance of having a stroke. You may prevent a stroke by making healthy choices and managing medical conditions. HOW CAN I REDUCE MY RISK OF HAVING A STROKE?   Stay physically active. Get at least 30 minutes of activity on most or all days.  Do not smoke. It may also be helpful to avoid exposure to secondhand smoke.  Limit alcohol use. Moderate alcohol use is considered to be:  No more than 2 drinks per day for men.  No more than 1 drink per day for nonpregnant women.  Eat healthy foods. This involves:  Eating 5 or more servings of fruits and vegetables a day.  Making dietary changes that address high blood pressure (hypertension), high cholesterol, diabetes, or obesity.  Manage your cholesterol levels.  Making food choices that are high in fiber and low in saturated fat, trans fat, and cholesterol may control cholesterol levels.  Take any prescribed medicines to control cholesterol as directed by your health care provider.  Manage your diabetes.  Controlling your carbohydrate and sugar intake is recommended to  manage diabetes.  Take any prescribed medicines to control diabetes as directed by your health care provider.  Control your hypertension.  Making food choices that are low in salt (sodium), saturated fat, trans fat, and cholesterol is recommended to manage hypertension.  Ask your health care provider if you need treatment to lower your blood pressure. Take any prescribed medicines to control hypertension as directed by your health care provider.  If you are 98-60 years of age, have your blood pressure checked every 3-5 years. If you are 8 years of age or older, have your blood pressure checked every year.  Maintain a healthy weight.  Reducing calorie intake and making food choices that are low in sodium, saturated fat, trans fat, and cholesterol are recommended to manage weight.  Stop drug abuse.  Avoid taking birth control pills.  Talk to your health care provider about the risks of taking birth control pills if you are over 70 years old, smoke, get migraines, or have ever had a blood clot.  Get evaluated for sleep disorders (sleep apnea).  Talk to your health care provider about getting a sleep evaluation if you snore a lot or have excessive sleepiness.  Take medicines only as directed by your health care provider.  For some people, aspirin or blood thinners (anticoagulants) are helpful in reducing the risk of forming abnormal blood clots that can lead to stroke. If you have the irregular heart rhythm of atrial fibrillation, you should be on a blood thinner unless there is a good reason you cannot take them.  Understand all your medicine instructions.  Make sure that other conditions (such as anemia or  atherosclerosis) are addressed. SEEK IMMEDIATE MEDICAL CARE IF:   You have sudden weakness or numbness of the face, arm, or leg, especially on one side of the body.  Your face or eyelid droops to one side.  You have sudden confusion.  You have trouble speaking (aphasia) or  understanding.  You have sudden trouble seeing in one or both eyes.  You have sudden trouble walking.  You have dizziness.  You have a loss of balance or coordination.  You have a sudden, severe headache with no known cause.  You have new chest pain or an irregular heartbeat. Any of these symptoms may represent a serious problem that is an emergency. Do not wait to see if the symptoms will go away. Get medical help at once. Call your local emergency services (911 in [place] Do not drive yourself to the hospital.   This information is not intended to replace advice given to you by your health care provider. Make sure you discuss any questions you have with your health care provider.   Document Released: 06/13/2004 Document Revised: 05/27/2014 Document Reviewed: 11/06/2012 Elsevier Interactive Patient Education [employer].

## 2015-11-22 ENCOUNTER — Ambulatory Visit (INDEPENDENT_AMBULATORY_CARE_PROVIDER_SITE_OTHER): Payer: Medicare Other | Admitting: *Deleted

## 2015-11-22 ENCOUNTER — Telehealth: Payer: Self-pay | Admitting: Cardiology

## 2015-11-22 DIAGNOSIS — Z950 Presence of cardiac pacemaker: Secondary | ICD-10-CM

## 2015-11-22 DIAGNOSIS — I495 Sick sinus syndrome: Secondary | ICD-10-CM

## 2015-11-22 NOTE — Telephone Encounter (Signed)
Spoke with pt and reminded pt of remote transmission that is due today. Pt verbalized understanding.   

## 2015-11-23 NOTE — Progress Notes (Signed)
Remote pacemaker transmission.   

## 2015-11-24 LAB — CUP PACEART REMOTE DEVICE CHECK
Battery Impedance: 895 Ohm
Battery Voltage: 2.77 V
Brady Statistic RV Percent Paced: 65 %
Date Time Interrogation Session: 20170706001612
Lead Channel Impedance Value: 0 Ohm
Lead Channel Impedance Value: 491 Ohm
Lead Channel Sensing Intrinsic Amplitude: 16 mV
Lead Channel Setting Pacing Amplitude: 2.5 V
Lead Channel Setting Pacing Pulse Width: 0.4 ms
Lead Channel Setting Sensing Sensitivity: 5.6 mV
MDC IDC LEAD IMPLANT DT: 20060510
MDC IDC LEAD LOCATION: 753860
MDC IDC MSMT BATTERY REMAINING LONGEVITY: 62 mo
MDC IDC MSMT LEADCHNL RV PACING THRESHOLD AMPLITUDE: 1 V
MDC IDC MSMT LEADCHNL RV PACING THRESHOLD PULSEWIDTH: 0.4 ms

## 2015-11-29 ENCOUNTER — Encounter: Payer: Self-pay | Admitting: Cardiology

## 2015-12-13 ENCOUNTER — Encounter: Payer: Self-pay | Admitting: Cardiology

## 2016-02-21 ENCOUNTER — Telehealth: Payer: Self-pay | Admitting: Cardiology

## 2016-02-21 ENCOUNTER — Encounter: Payer: Medicare Other | Admitting: *Deleted

## 2016-02-21 NOTE — Telephone Encounter (Signed)
LMOVM reminding pt to send remote transmission.   

## 2016-02-23 ENCOUNTER — Encounter: Payer: Self-pay | Admitting: Cardiology

## 2016-03-04 ENCOUNTER — Ambulatory Visit (INDEPENDENT_AMBULATORY_CARE_PROVIDER_SITE_OTHER): Payer: Medicare Other | Admitting: *Deleted

## 2016-03-04 DIAGNOSIS — I495 Sick sinus syndrome: Secondary | ICD-10-CM

## 2016-03-04 NOTE — Progress Notes (Signed)
Remote pacemaker transmission.   

## 2016-03-06 ENCOUNTER — Encounter: Payer: Self-pay | Admitting: Cardiology

## 2016-03-15 LAB — CUP PACEART REMOTE DEVICE CHECK
Battery Impedance: 1129 Ohm
Battery Voltage: 2.77 V
Brady Statistic RV Percent Paced: 73 %
Lead Channel Impedance Value: 0 Ohm
Lead Channel Impedance Value: 485 Ohm
Lead Channel Sensing Intrinsic Amplitude: 11.2 mV
Lead Channel Setting Pacing Amplitude: 2.5 V
Lead Channel Setting Pacing Pulse Width: 0.4 ms
Lead Channel Setting Sensing Sensitivity: 4 mV
MDC IDC LEAD IMPLANT DT: 20060510
MDC IDC LEAD LOCATION: 753860
MDC IDC MSMT BATTERY REMAINING LONGEVITY: 54 mo
MDC IDC MSMT LEADCHNL RV PACING THRESHOLD AMPLITUDE: 0.75 V
MDC IDC MSMT LEADCHNL RV PACING THRESHOLD PULSEWIDTH: 0.4 ms
MDC IDC SESS DTM: 20171014153704

## 2016-03-22 ENCOUNTER — Encounter: Payer: Self-pay | Admitting: Cardiology

## 2016-03-27 ENCOUNTER — Ambulatory Visit: Payer: Medicare Other | Admitting: Adult Health

## 2016-03-27 ENCOUNTER — Telehealth: Payer: Self-pay

## 2016-03-27 NOTE — Telephone Encounter (Signed)
Patient did not show to appt today  

## 2016-03-28 ENCOUNTER — Encounter: Payer: Self-pay | Admitting: Adult Health

## 2016-04-09 ENCOUNTER — Ambulatory Visit (INDEPENDENT_AMBULATORY_CARE_PROVIDER_SITE_OTHER): Payer: Medicare Other | Admitting: Adult Health

## 2016-04-09 ENCOUNTER — Encounter: Payer: Self-pay | Admitting: Adult Health

## 2016-04-09 VITALS — BP 113/70 | HR 66 | Wt 156.2 lb

## 2016-04-09 DIAGNOSIS — Z8673 Personal history of transient ischemic attack (TIA), and cerebral infarction without residual deficits: Secondary | ICD-10-CM | POA: Diagnosis not present

## 2016-04-09 NOTE — Patient Instructions (Signed)
Continue Xarelto  Keep Blood Pressure <130/90 Cholesterol LDL <70 hgbA1c <6.5 % If your symptoms worsen or you develop new symptoms please let us know.

## 2016-04-09 NOTE — Progress Notes (Signed)
PATIENT: ALEJOS ARLINE DOB: 07/25/1923  REASON FOR VISIT: follow up- stroke HISTORY FROM: patient  HISTORY OF PRESENT ILLNESS: Mr. Tonthat is an 82 year old male with a history of stroke. He returns today for follow-up. The patient is currently on Xarelto and tolerating it well. Denies any significant bruising or bleeding. The patient's blood pressure is in normal range today. Reports that his primary care  checks his cholesterol and diabetes. Reports that he was able to go off of his diabetes medication. He states that he did not have any residual symptoms after his stroke. He feels that he did have a stroke because he was temporarily off of Xarelto. He does feel that he has a poor appetite. He lives at home alone. Able to complete all ADLs independently. He returns today for an evaluation.  HISTORY 09/25/15 (sethi): Mr Remington is a 63 year Caucasian male seen today for first office f/u visit after [place] for stroke in March 2017. ALEZ RIVETTE is an 82 y.o. male patient who was brought into the emergency room via EMS with symptoms of severe dysarthria, right gaze deviation and left hemiplegia. Patient was at church when symptoms started. Time of onset was reportedly at 12 PM on 07/23/15, witnessed by other people at church. Patient unable to provide history during the initial evaluation (LKW 07/23/2015 at 12 noon). His level of alertness, and dysarthria improved gradually with time values in the ER. Then he was able to provide more history. He reported taking his last dose of Xarelto David before yesterday at 6 PM, he missed only one dose of Xarelto yesterday. Patient was not administered IV t-PA secondary to being on xarelto (last dose 42 hours ago). CT angio showed clot right M1, but given significant clinical improvement and risk of procedure, after discussion with family and Dr. Rowe Robert was made to hold off on neuro intervention. He was admitted for further evaluation and  treatment. Transthoracic echo showed normal ejection fraction without cardiac source of embolism. CT perfusion scan showed infarct in the right MCA territory with sparing of the basal ganglia. Cineangiogram of the neck showed no significant extracranial stenosis. LDL cholesterol was 68 mg percent and hemoglobin A1c was 6.5. Patient was started on Xarelto for stroke prevention. He was seen by physical occupational speech therapy. Patient did well and made a full recovery. He states his remains on Xarelto is tolerating well without bleeding or bruising. His blood pressure is well controlled and today it is 104/52. He's had some diminished appetite and has lost a few pounds. He has an upcoming appointment with his primary physician later this week and plans to discuss this with him. He has no new neurological complaints. His return back to all activities of daily living.   REVIEW OF SYSTEMS: Out of a complete 14 system review of symptoms, the patient complains only of the following symptoms, and all other reviewed systems are negative.   Double vision, insomnia, joint pain, numbness  ALLERGIES: No Known Allergies  HOME MEDICATIONS: Outpatient Medications Prior to Visit  Medication Sig Dispense Refill  . bisacodyl (BISACODYL) 5 MG EC tablet Take 5 mg by mouth daily as needed for moderate constipation.    Marland Kitchen omeprazole (PRILOSEC) 20 MG capsule Take 20 mg by mouth daily as needed (for heartburn).    Marland Kitchen OVER THE COUNTER MEDICATION Take 1 tablet by mouth daily. "appetitie boost" for supplement    . polycarbophil (FIBERCON) 625 MG tablet Take 625 mg by mouth daily as  needed for moderate constipation.    . Rivaroxaban (XARELTO) 15 MG TABS tablet Take 1 tablet (15 mg total) by mouth daily before supper. 30 tablet 1  . losartan-hydrochlorothiazide (HYZAAR) 100-12.5 MG tablet Take 1 tablet by mouth daily.  0  . metoprolol tartrate (LOPRESSOR) 25 MG tablet Take 25 mg by mouth 2 (two) times daily.     No  facility-administered medications prior to visit.     PAST MEDICAL HISTORY: Past Medical History:  Diagnosis Date  . Atrial fibrillation ([place])   . CKD (chronic kidney disease)   . Diabetes mellitus   . Hx SBO    Last 2013 all treated conservatively  . Hypertension   . Prostate cancer ([place])   . Stroke ([place])   . Tachycardia-bradycardia syndrome ([place])   . Ventral hernia    x3    PAST SURGICAL HISTORY: Past Surgical History:  Procedure Laterality Date  . CHOLECYSTECTOMY OPEN  03/31/2001   Dr Lindon Romp  . EXPLORATORY LAPAROTOMY    . HEMORRHOID SURGERY    . HIATAL HERNIA REPAIR  2002  . PACEMAKER INSERTION    . PERMANENT PACEMAKER GENERATOR CHANGE N/A 04/06/2012   Procedure: PERMANENT PACEMAKER GENERATOR CHANGE;  Surgeon: Evans Lance, MD;  Location: [facility] CATH LAB;  Service: Cardiovascular;  Laterality: N/A;  . SMALL INTESTINE SURGERY  09/16/2004   SBO resection, VH repair    FAMILY HISTORY: Family History  Problem Relation Age of Onset  . Pneumonia Father   . Stroke Brother   . Stroke Sister   . Cancer Brother     type unknown    SOCIAL HISTORY: Social History   Social History  . Marital status: Widowed    Spouse name: N/A  . Number of children: N/A  . Years of education: N/A   Occupational History  . Not on file.   Social History Main Topics  . Smoking status: Former Smoker    Packs/day: 1.50    Quit date: 04/16/1955  . Smokeless tobacco: Former Systems developer    Types: Snuff, Sarina Ser    Quit date: 08/17/1952  . Alcohol use No  . Drug use: No  . Sexual activity: Not on file   Other Topics Concern  . Not on file   Social History Narrative  . No narrative on file      PHYSICAL EXAM  Vitals:   04/09/16 1433  BP: 113/70  Pulse: 66  Weight: 156 lb 3.2 oz (70.9 kg)   Body mass index is 21.79 kg/m.  Generalized: Well developed, in no acute distress   Neurological examination  Mentation: Alert oriented to time, place, history taking. Follows all commands  speech and language fluent Cranial nerve II-XII: Pupils were equal round reactive to light. Extraocular movements were full, visual field were full on confrontational test. Facial sensation and strength were normal. Uvula tongue midline. Head turning and shoulder shrug  were normal and symmetric. Motor: The motor testing reveals 5 over 5 strength of all 4 extremities. Good symmetric motor tone is noted throughout.  Sensory: Sensory testing is intact to soft touch on all 4 extremities. No evidence of extinction is noted.  Coordination: Cerebellar testing reveals good finger-nose-finger and heel-to-shin bilaterally.  Gait and station: Gait is normal. Reflexes: Deep tendon reflexes are symmetric and normal bilaterally.   DIAGNOSTIC DATA (LABS, IMAGING, TESTING) - I reviewed patient records, labs, notes, testing and imaging myself where available.  Lab Results  Component Value Date   WBC 6.9 07/23/2015   HGB  12.9 (L) 07/23/2015   HCT 38.0 (L) 07/23/2015   MCV 77.2 (L) 07/23/2015   PLT 149 (L) 07/23/2015      Component Value Date/Time   NA 142 07/23/2015 1300   K 4.1 07/23/2015 1300   CL 104 07/23/2015 1300   CO2 24 07/23/2015 1254   GLUCOSE 73 07/23/2015 1300   BUN 13 07/23/2015 1300   CREATININE 1.50 (H) 07/23/2015 1300   CALCIUM 8.4 (L) 07/23/2015 1254   PROT 5.9 (L) 07/23/2015 1254   ALBUMIN 3.5 07/23/2015 1254   AST 22 07/23/2015 1254   ALT 9 (L) 07/23/2015 1254   ALKPHOS 75 07/23/2015 1254   BILITOT 0.4 07/23/2015 1254   GFRNONAA 39 (L) 07/23/2015 1254   GFRAA 45 (L) 07/23/2015 1254   Lab Results  Component Value Date   CHOL 122 07/23/2015   HDL 43 07/23/2015   LDLCALC 68 07/23/2015   TRIG 55 07/23/2015   CHOLHDL 2.8 07/23/2015   Lab Results  Component Value Date   HGBA1C 6.5 (H) 07/23/2015    Lab Results  Component Value Date   TSH 0.811 04/07/2010      ASSESSMENT AND PLAN 82 y.o. year old male  has a past medical history of Atrial fibrillation ([place]); CKD  (chronic kidney disease); Diabetes mellitus; SBO; Hypertension; Prostate cancer ([place]); Stroke [facility]); Tachycardia-bradycardia syndrome ([place]); and Ventral hernia. here with:  1. History of stroke  Overall the patient is doing well. He will continue on Xarelto for stroke prevention. Continue to monitor blood pressure with goal less than 130/90. Cholesterol LDL less than 70 and hemoglobin A1c less than 6.5%. Advised patient that if he feels that his appetite is poor he could consider supplementing with Glucerna. Patient advised that if he has any strokelike symptoms he should to the emergency room. He will follow-up in 6 months or sooner if needed.     Ward Givens, MSN, NP-C 04/09/2016, 2:46 PM [facility] [address], [place], [place] [address] [phone]

## 2016-04-09 NOTE — Progress Notes (Signed)
I agree with the above plan 

## 2016-06-03 ENCOUNTER — Telehealth: Payer: Self-pay | Admitting: Cardiology

## 2016-06-03 ENCOUNTER — Ambulatory Visit (INDEPENDENT_AMBULATORY_CARE_PROVIDER_SITE_OTHER): Payer: Medicare Other | Admitting: *Deleted

## 2016-06-03 DIAGNOSIS — I495 Sick sinus syndrome: Secondary | ICD-10-CM | POA: Diagnosis not present

## 2016-06-03 NOTE — Telephone Encounter (Signed)
Attempted to confirm remote transmission with pt. No answer and was unable to leave a message.   

## 2016-06-04 NOTE — Progress Notes (Signed)
Remote pacemaker transmission.   

## 2016-06-07 LAB — CUP PACEART REMOTE DEVICE CHECK
Battery Impedance: 1235 Ohm
Battery Remaining Longevity: 51 mo
Battery Voltage: 2.76 V
Implantable Lead Implant Date: 20060510
Implantable Lead Location: 753860
Implantable Pulse Generator Implant Date: 20131118
Lead Channel Pacing Threshold Pulse Width: 0.4 ms
Lead Channel Setting Pacing Pulse Width: 0.4 ms
Lead Channel Setting Sensing Sensitivity: 5.6 mV
MDC IDC MSMT LEADCHNL RA IMPEDANCE VALUE: 0 Ohm
MDC IDC MSMT LEADCHNL RV IMPEDANCE VALUE: 482 Ohm
MDC IDC MSMT LEADCHNL RV PACING THRESHOLD AMPLITUDE: 0.875 V
MDC IDC MSMT LEADCHNL RV SENSING INTR AMPL: 16 mV
MDC IDC SESS DTM: 20180115212553
MDC IDC SET LEADCHNL RV PACING AMPLITUDE: 2.5 V
MDC IDC STAT BRADY RV PERCENT PACED: 75 %

## 2016-07-26 ENCOUNTER — Other Ambulatory Visit: Payer: Self-pay | Admitting: Cardiovascular Disease

## 2016-07-26 DIAGNOSIS — I739 Peripheral vascular disease, unspecified: Secondary | ICD-10-CM

## 2016-07-26 NOTE — Telephone Encounter (Signed)
07/30/2016 Received faxed referral from [facility] for upcoming appointment with Dr. Fletcher Anon on 07/30/2016.  Records given to [facility].  cbr

## 2016-07-29 ENCOUNTER — Ambulatory Visit (HOSPITAL_COMMUNITY)
Admission: RE | Admit: 2016-07-29 | Discharge: 2016-07-29 | Disposition: A | Discharge status: Home / Self Care | Payer: Medicare Other | Source: Ambulatory Visit | Attending: Cardiology | Admitting: Cardiology

## 2016-07-29 DIAGNOSIS — L84 Corns and callosities: Secondary | ICD-10-CM | POA: Diagnosis not present

## 2016-07-29 DIAGNOSIS — I739 Peripheral vascular disease, unspecified: Secondary | ICD-10-CM | POA: Insufficient documentation

## 2016-07-29 DIAGNOSIS — M79675 Pain in left toe(s): Secondary | ICD-10-CM | POA: Diagnosis not present

## 2016-07-30 ENCOUNTER — Encounter: Payer: Self-pay | Admitting: Cardiovascular Disease

## 2016-07-30 ENCOUNTER — Telehealth: Payer: Self-pay | Admitting: Cardiovascular Disease

## 2016-07-30 ENCOUNTER — Ambulatory Visit (INDEPENDENT_AMBULATORY_CARE_PROVIDER_SITE_OTHER): Payer: Medicare Other | Admitting: Cardiovascular Disease

## 2016-07-30 VITALS — BP 124/86 | HR 84 | Ht 71.0 in | Wt 161.0 lb

## 2016-07-30 DIAGNOSIS — I482 Chronic atrial fibrillation, unspecified: Secondary | ICD-10-CM

## 2016-07-30 DIAGNOSIS — I10 Essential (primary) hypertension: Secondary | ICD-10-CM

## 2016-07-30 DIAGNOSIS — I739 Peripheral vascular disease, unspecified: Secondary | ICD-10-CM

## 2016-07-30 LAB — CBC
HCT: 36 % — ABNORMAL LOW (ref 38.5–50.0)
Hemoglobin: 11.4 g/dL — ABNORMAL LOW (ref 13.2–17.1)
MCH: 25.9 pg — AB (ref 27.0–33.0)
MCHC: 31.7 g/dL — AB (ref 32.0–36.0)
MCV: 81.6 fL (ref 80.0–100.0)
MPV: 11.1 fL (ref 7.5–12.5)
Platelets: 158 10*3/uL (ref 140–400)
RBC: 4.41 MIL/uL (ref 4.20–5.80)
RDW: 17.7 % — ABNORMAL HIGH (ref 11.0–15.0)
WBC: 6.2 10*3/uL (ref 3.8–10.8)

## 2016-07-30 LAB — BASIC METABOLIC PANEL
BUN: 12 mg/dL (ref 7–25)
CALCIUM: 8.6 mg/dL (ref 8.6–10.3)
CO2: 27 mmol/L (ref 20–31)
CREATININE: 1.58 mg/dL — AB (ref 0.70–1.11)
Chloride: 108 mmol/L (ref 98–110)
Glucose, Bld: 97 mg/dL (ref 65–99)
Potassium: 4.8 mmol/L (ref 3.5–5.3)
SODIUM: 142 mmol/L (ref 135–146)

## 2016-07-30 MED ORDER — ASPIRIN EC 81 MG PO TBEC: 81.0000 mg | DELAYED_RELEASE_TABLET | Freq: Every day | ORAL | Status: DC

## 2016-07-30 NOTE — Patient Instructions (Addendum)
Medication Instructions:  Your physician has recommended you make the following change in your medication:  1. START Aspirin 81mg  take one tablet by mouth daily  Labwork: Your physician recommends that you have lab work today: BMP, CBC and PT/INR  Testing/Procedures: Your physician has requested that you have a peripheral vascular angiogram. This exam is performed at the hospital. During this exam IV contrast is used to look at arterial blood flow. Please review the information sheet given for details.    [facility] [address] [place] [address] Dept: [phone] Loc: [phone]  [name]  07/30/2016  You are scheduled for a Peripheral Angiogram on Wednesday, March 21 with Dr. Kathlyn Sacramento.  1. Please arrive at the [facility] (Main Entrance A) at [facility]: [address] [place], [place] [address] at 6:30 AM (two hours before your procedure to ensure your preparation). Free valet parking service is available.   Special note: Every effort is made to have your procedure done on time. Please understand that emergencies sometimes delay scheduled procedures.  2. Diet: Do not eat or drink anything after midnight prior to your procedure except sips of water to take medications.  3. Labs: Labs will be drawn today.  4. Medication instructions in preparation for your procedure:  Stop taking Xarelto (Rivaroxaban) on Monday, March 19. You will take your last dosage of Xarelto on Sunday, March 18.  On the morning of your procedure, take your Aspirin and any morning medicines NOT listed above.  You may use sips of water.  5. Plan for one night stay--bring personal belongings.  6. Bring a current list of your medications and current insurance cards.  7. You MUST have a responsible person to drive you home.  8. Someone MUST be with you the first 24 hours after you arrive  home or your discharge will be delayed.  9. Please wear clothes that are easy to get on and off and wear slip-on shoes.  Thank you for allowing [place] to care for you!   -- [facility]   Follow-Up: Your physician recommends that you schedule a follow-up appointment in: 3-4 WEEKS with Dr Fletcher Anon   Any Other Special Instructions Will Be Listed Below (If Applicable).     If you need a refill on your cardiac medications before your next appointment, please call your pharmacy.

## 2016-07-30 NOTE — Telephone Encounter (Signed)
I spoke with the pt's grand-daughter who helps with his care and reviewed pre procedure instructions over the phone.  I answered questions about the procedure and instructed her to contact the office with any additional questions or concerns.

## 2016-07-30 NOTE — Progress Notes (Signed)
Cardiology Office Note   Date:  07/30/2016   ID:  [name], DOB 1923-09-24, MRN [id number]  PCP:  Foye Spurling, MD  Cardiologist:  Dr. Lovena Le  Chief Complaint  Patient presents with  . Follow-up    New patient. Absent pulses/Black toe.      History of Present Illness: [name] is a 83 y.o. male who Was referred by Dr. Blima Dessert for evaluation and management of peripheral arterial disease. The patient has known history of chronic atrial fibrillation on long-term anticoagulation with Xarelto, symptomatic bradycardia status post permanent pacemaker placement, previous stroke one day after running out of Xarelto and diet-controlled diabetes. The patient developed ulceration on the left distal fourth toe about one year ago according to him. This became painful with dark lack discoloration in the last few weeks. There is a callus in that area with plans for debridement but the patient was referred for vascular evaluation before doing that. He underwent vascular studies in our office which showed normal ABI on the right side and mildly reduced on the left side at 0.93. Duplex showed occlusion of the distal left popliteal artery and tibial peroneal trunk.    Past Medical History:  Diagnosis Date  . Atrial fibrillation ([place])   . CKD (chronic kidney disease)   . Diabetes mellitus   . Hx SBO    Last 2013 all treated conservatively  . Hypertension   . Prostate cancer ([place])   . Stroke ([place])   . Tachycardia-bradycardia syndrome ([place])   . Ventral hernia    x3    Past Surgical History:  Procedure Laterality Date  . CHOLECYSTECTOMY OPEN  03/31/2001   Dr Lindon Romp  . EXPLORATORY LAPAROTOMY    . HEMORRHOID SURGERY    . HIATAL HERNIA REPAIR  2002  . PACEMAKER INSERTION    . PERMANENT PACEMAKER GENERATOR CHANGE N/A 04/06/2012   Procedure: PERMANENT PACEMAKER GENERATOR CHANGE;  Surgeon: Evans Lance, MD;  Location[facility] C[facility] LAB;  Service: Cardiovascular;   Laterality: N/A;  . SMALL INTESTINE SURGERY  09/16/2004   SBO resection, VH repair     Current Outpatient Prescriptions  Medication Sig Dispense Refill  . bisacodyl (BISACODYL) 5 MG EC tablet Take 5 mg by mouth daily as needed for moderate constipation.    Marland Kitchen latanoprost (XALATAN) 0.005 % ophthalmic solution Left eye nightly    . omeprazole (PRILOSEC) 20 MG capsule Take 20 mg by mouth daily as needed (for heartburn).    Marland Kitchen OVER THE COUNTER MEDICATION Take 1 tablet by mouth daily. "appetitie boost" for supplement    . polycarbophil (FIBERCON) 625 MG tablet Take 625 mg by mouth daily as needed for moderate constipation.    . Rivaroxaban (XARELTO) 15 MG TABS tablet Take 1 tablet (15 mg total) by mouth daily before supper. 30 tablet 1   No current facility-administered medications for this visit.     Allergies:   Patient has no known allergies.    Social History:  The patient  reports that he quit smoking about 61 years ago. He smoked 1.50 packs per day. He quit smokeless tobacco use about 63 years ago. His smokeless tobacco use included Snuff and Chew. He reports that he does not drink alcohol or use drugs.   Family History:  The patient's family history includes Cancer in his brother; Pneumonia in his father; Stroke in his brother and sister.    ROS:  Please see the history of present illness.   Otherwise, review  of systems are positive for none.   All other systems are reviewed and negative.    PHYSICAL EXAM: VS:  BP 124/86   Pulse 84   Ht 5\' 11"  (1.803 m)   Wt 161 lb (73 kg)   BMI 22.45 kg/m  , BMI Body mass index is 22.45 kg/m. GEN: Well nourished, well developed, in no acute distress  HEENT: normal  Neck: no JVD, carotid bruits, or masses Cardiac: RRR; no murmurs, rubs, or gallops,no edema  Respiratory:  clear to auscultation bilaterally, normal work of breathing GI: soft, nontender, nondistended, + BS MS: no deformity or atrophy  Skin: warm and dry, no rash Neuro:   Strength and sensation are intact Psych: euthymic mood, full affect Vascular: Radial pulse is +1 bilaterally. Femoral pulses: +2 bilaterally. Distal pulses are not palpable. There is a small callus at the tip of the left fourth toe with ulceration under it and black discoloration of the toe  EKG:  EKG is ordered today. The ekg ordered today demonstrates  ventricular paced rhythm with underlying atrial fibrillation.   Recent Labs: No results found for requested labs within last 8760 hours.    Lipid Panel    Component Value Date/Time   CHOL 122 07/23/2015 1254   TRIG 55 07/23/2015 1254   HDL 43 07/23/2015 1254   CHOLHDL 2.8 07/23/2015 1254   VLDL 11 07/23/2015 1254   LDLCALC 68 07/23/2015 1254      Wt Readings from Last 3 Encounters:  07/30/16 161 lb (73 kg)  04/09/16 156 lb 3.2 oz (70.9 kg)  09/25/15 158 lb (71.7 kg)       No flowsheet data found.    ASSESSMENT AND PLAN:  1.  Peripheral arterial disease: Ischemic left fourth toe with black discoloration and small ulceration. ABI seems to be falsely elevated and duplex showed occluded distal left popliteal artery and TP trunk. The patient is at high risk for progression of limb ischemia and poor healing. I discussed different management options and recommended proceeding with abdominal aortogram, left lower extremity angiography and possible endovascular intervention. I discussed the procedure in details as well as risks and benefits. Biggest stress is going to be the risk of stroke while he is off Xarelto. I would hold Xarelto only for 2 days and I'm going to start him on aspirin 81 mg once daily.  2. Chronic atrial fibrillation: He is currently in ventricular paced rhythm.    Disposition:   FU with me in 1 month  Signed,  Kathlyn Sacramento, MD  07/30/2016 10:06 AM    [place]

## 2016-07-30 NOTE — Telephone Encounter (Signed)
[name] is calling to find out if her grandfather is to have surgery on his foot . Please call

## 2016-07-31 LAB — PROTIME-INR
INR: 1.3 — ABNORMAL HIGH
PROTHROMBIN TIME: 13.4 s — AB (ref 9.0–11.5)

## 2016-08-05 ENCOUNTER — Telehealth: Payer: Self-pay | Admitting: Internal Medicine

## 2016-08-05 NOTE — Telephone Encounter (Signed)
Spoke w/ pt niece. She is ok w/ sending a remote transmission on 09-09-16. Remote transmission scheduled for this date.

## 2016-08-05 NOTE — Telephone Encounter (Signed)
New Message  Ms Juliann Pulse call requesting to speak with RN to see when pt needs to send a remote transmission. Please call back to discuss

## 2016-08-07 ENCOUNTER — Ambulatory Visit (HOSPITAL_COMMUNITY)
Admission: RE | Admit: 2016-08-07 | Discharge: 2016-08-07 | Disposition: A | Discharge status: Home / Self Care | Payer: Medicare Other | Source: Ambulatory Visit | Attending: Cardiovascular Disease | Admitting: Cardiovascular Disease

## 2016-08-07 ENCOUNTER — Encounter (HOSPITAL_COMMUNITY)
Admission: RE | Disposition: A | Discharge status: Home / Self Care | Payer: Self-pay | Source: Ambulatory Visit | Attending: Cardiovascular Disease

## 2016-08-07 DIAGNOSIS — Z823 Family history of stroke: Secondary | ICD-10-CM | POA: Insufficient documentation

## 2016-08-07 DIAGNOSIS — Z87891 Personal history of nicotine dependence: Secondary | ICD-10-CM | POA: Diagnosis not present

## 2016-08-07 DIAGNOSIS — Z8673 Personal history of transient ischemic attack (TIA), and cerebral infarction without residual deficits: Secondary | ICD-10-CM | POA: Insufficient documentation

## 2016-08-07 DIAGNOSIS — Z8546 Personal history of malignant neoplasm of prostate: Secondary | ICD-10-CM | POA: Insufficient documentation

## 2016-08-07 DIAGNOSIS — I482 Chronic atrial fibrillation: Secondary | ICD-10-CM | POA: Insufficient documentation

## 2016-08-07 DIAGNOSIS — Z950 Presence of cardiac pacemaker: Secondary | ICD-10-CM | POA: Insufficient documentation

## 2016-08-07 DIAGNOSIS — E1122 Type 2 diabetes mellitus with diabetic chronic kidney disease: Secondary | ICD-10-CM | POA: Diagnosis not present

## 2016-08-07 DIAGNOSIS — E1151 Type 2 diabetes mellitus with diabetic peripheral angiopathy without gangrene: Secondary | ICD-10-CM | POA: Diagnosis present

## 2016-08-07 DIAGNOSIS — I129 Hypertensive chronic kidney disease with stage 1 through stage 4 chronic kidney disease, or unspecified chronic kidney disease: Secondary | ICD-10-CM | POA: Diagnosis not present

## 2016-08-07 DIAGNOSIS — N189 Chronic kidney disease, unspecified: Secondary | ICD-10-CM | POA: Insufficient documentation

## 2016-08-07 DIAGNOSIS — I70262 Atherosclerosis of native arteries of extremities with gangrene, left leg: Secondary | ICD-10-CM

## 2016-08-07 DIAGNOSIS — L97529 Non-pressure chronic ulcer of other part of left foot with unspecified severity: Secondary | ICD-10-CM | POA: Diagnosis not present

## 2016-08-07 DIAGNOSIS — Z7901 Long term (current) use of anticoagulants: Secondary | ICD-10-CM | POA: Diagnosis not present

## 2016-08-07 DIAGNOSIS — I739 Peripheral vascular disease, unspecified: Secondary | ICD-10-CM

## 2016-08-07 HISTORY — PX: ABDOMINAL AORTOGRAM W/LOWER EXTREMITY: CATH118223

## 2016-08-07 HISTORY — PX: PERIPHERAL VASCULAR BALLOON ANGIOPLASTY: CATH118281

## 2016-08-07 LAB — GLUCOSE, CAPILLARY: Glucose-Capillary: 83 mg/dL (ref 65–99)

## 2016-08-07 LAB — POCT ACTIVATED CLOTTING TIME
ACTIVATED CLOTTING TIME: 197 s
ACTIVATED CLOTTING TIME: 175 s
ACTIVATED CLOTTING TIME: 180 s
Activated Clotting Time: 219 seconds
Activated Clotting Time: 197 seconds

## 2016-08-07 SURGERY — ABDOMINAL AORTOGRAM W/LOWER EXTREMITY

## 2016-08-07 MED ORDER — SODIUM CHLORIDE 0.9 % IV SOLN: 250.0000 mL | INTRAVENOUS | Status: DC | PRN

## 2016-08-07 MED ORDER — HEPARIN SODIUM (PORCINE) 1000 UNIT/ML IJ SOLN
INTRAMUSCULAR | Status: AC
Start: 2016-08-07 — End: 2016-08-07
  Filled 2016-08-07: qty 1

## 2016-08-07 MED ORDER — SODIUM CHLORIDE 0.9% FLUSH: 3.0000 mL | INTRAVENOUS | Status: DC | PRN

## 2016-08-07 MED ORDER — SODIUM CHLORIDE 0.9 % IV SOLN
INTRAVENOUS | Status: DC
  Administered 2016-08-07: 07:00:00 via INTRAVENOUS

## 2016-08-07 MED ORDER — HEPARIN (PORCINE) IN NACL 2-0.9 UNIT/ML-% IJ SOLN
INTRAMUSCULAR | Status: AC
  Filled 2016-08-07: qty 1000

## 2016-08-07 MED ORDER — ASPIRIN 81 MG PO CHEW
CHEWABLE_TABLET | ORAL | Status: AC
  Administered 2016-08-07: 81 mg via ORAL
  Filled 2016-08-07: qty 1

## 2016-08-07 MED ORDER — HEPARIN SODIUM (PORCINE) 1000 UNIT/ML IJ SOLN
INTRAMUSCULAR | Status: DC | PRN
  Administered 2016-08-07: 6000 [IU] via INTRAVENOUS
  Administered 2016-08-07: 3000 [IU] via INTRAVENOUS

## 2016-08-07 MED ORDER — SODIUM CHLORIDE 0.9% FLUSH: 3.0000 mL | Freq: Two times a day (BID) | INTRAVENOUS | Status: DC

## 2016-08-07 MED ORDER — HEPARIN (PORCINE) IN NACL 2-0.9 UNIT/ML-% IJ SOLN
INTRAMUSCULAR | Status: DC | PRN
  Administered 2016-08-07: 1000 mL via INTRA_ARTERIAL

## 2016-08-07 MED ORDER — RIVAROXABAN 15 MG PO TABS: 15.0000 mg | ORAL_TABLET | Freq: Every day | ORAL | 3 refills | Status: DC

## 2016-08-07 MED ORDER — SODIUM CHLORIDE 0.9 % IV SOLN: INTRAVENOUS | Status: AC

## 2016-08-07 MED ORDER — ASPIRIN 81 MG PO CHEW
81.0000 mg | CHEWABLE_TABLET | ORAL | Status: AC
Start: 2016-08-07 — End: 2016-08-07
  Administered 2016-08-07: 81 mg via ORAL

## 2016-08-07 MED ORDER — LIDOCAINE HCL (PF) 1 % IJ SOLN
INTRAMUSCULAR | Status: DC | PRN
  Administered 2016-08-07: 20 mL via SUBCUTANEOUS

## 2016-08-07 MED ORDER — IODIXANOL 320 MG/ML IV SOLN
INTRAVENOUS | Status: DC | PRN
  Administered 2016-08-07: 120 mL via INTRA_ARTERIAL

## 2016-08-07 MED ORDER — LIDOCAINE HCL (PF) 1 % IJ SOLN
INTRAMUSCULAR | Status: AC
  Filled 2016-08-07: qty 30

## 2016-08-07 SURGICAL SUPPLY — 22 items
CATH ANGIO 5F PIGTAIL 65CM (CATHETERS) ×4 IMPLANT
CATH CROSS OVER TEMPO 5F (CATHETERS) ×4 IMPLANT
CATH CXI 2.3F 135 ANG 2 (CATHETERS) ×4 IMPLANT
CATH NAVICROSS ANGLED 135CM (MICROCATHETER) ×4 IMPLANT
CATH STRAIGHT 5FR 65CM (CATHETERS) ×4 IMPLANT
DEVICE CONTINUOUS FLUSH (MISCELLANEOUS) ×4 IMPLANT
DEVICE TORQUE .014-.018 (MISCELLANEOUS) ×2 IMPLANT
GLIDEWIRE ADV .035X180CM (WIRE) ×4 IMPLANT
GUIDEWIRE ANGLED .035X260CM (WIRE) ×4 IMPLANT
GUIDEWIRE ASTATO XS 20G 300CM (WIRE) ×4 IMPLANT
GUIDEWIRE STR TIP .014X300X8 (WIRE) ×8 IMPLANT
KIT PV (KITS) ×4 IMPLANT
SHEATH PINNACLE 5F 10CM (SHEATH) ×4 IMPLANT
SHEATH PINNACLE ST 6F 45CM (SHEATH) ×4 IMPLANT
STOPCOCK MORSE 400PSI 3WAY (MISCELLANEOUS) ×4 IMPLANT
SYR MEDRAD MARK V 150ML (SYRINGE) ×4 IMPLANT
SYRINGE MEDRAD AVANTA MACH 7 (SYRINGE) ×4 IMPLANT
TORQUE DEVICE .014-.018 (MISCELLANEOUS) ×4
TRANSDUCER W/STOPCOCK (MISCELLANEOUS) ×4 IMPLANT
TRAY PV CATH (CUSTOM PROCEDURE TRAY) ×4 IMPLANT
TUBING CIL FLEX 10 FLL-RA (TUBING) ×4 IMPLANT
WIRE HITORQ VERSACORE ST 145CM (WIRE) ×4 IMPLANT

## 2016-08-07 NOTE — Progress Notes (Signed)
Site area: Rt fem art sheath removed Site Prior to Removal:  Level 0  Pressure Applied For: 30 min Manual:   yes Patient Status During Pull:  A/O Post Pull Site:  Level 0 Post Pull Instructions Given:  Post instructions given and pt understands Post Pull Pulses Present: Doppler Rt Dp/Pt    Lt Pt Doppler Dressing Applied:  Tegaderm and a 4x4 and a pressure dressing Bedrest begins @ 13:00:00 Comments: Pt leaves cath lab holding area in stable condition. Rt groin is unremarkable. Dressing is CDI.

## 2016-08-07 NOTE — Progress Notes (Signed)
Site area: RFA Site Prior to Removal:  Level 0 Pressure Applied For: Manual: yes   Patient Status During Pull:  stable Post Pull Site:  Level 0 Post Pull Instructions Given:  yes Post Pull Pulses Present: weak/palpable Dressing Applied: tegaderm  Bedrest begins @  Comments:

## 2016-08-07 NOTE — Interval H&P Note (Signed)
History and Physical Interval Note:  08/07/2016 8:48 AM  [name]  has presented today for surgery, with the diagnosis of pvd  The various methods of treatment have been discussed with the patient and family. After consideration of risks, benefits and other options for treatment, the patient has consented to  Procedure(s): Abdominal Aortogram w/Lower Extremity (N/A) as a surgical intervention .  The patient's history has been reviewed, patient examined, no change in status, stable for surgery.  I have reviewed the patient's chart and labs.  Questions were answered to the patient's satisfaction.     Kathlyn Sacramento

## 2016-08-07 NOTE — H&P (View-Only) (Signed)
Cardiology Office Note   Date:  07/30/2016   ID:  [name], DOB [birth year], MRN [id number]  PCP:  Foye Spurling, MD  Cardiologist:  Dr. Lovena Le  Chief Complaint  Patient presents with  . Follow-up    New patient. Absent pulses/Black toe.      History of Present Illness: [name] is a 83 y.o. male who Was referred by Dr. Blima Dessert for evaluation and management of peripheral arterial disease. The patient has known history of chronic atrial fibrillation on long-term anticoagulation with Xarelto, symptomatic bradycardia status post permanent pacemaker placement, previous stroke one day after running out of Xarelto and diet-controlled diabetes. The patient developed ulceration on the left distal fourth toe about one year ago according to him. This became painful with dark lack discoloration in the last few weeks. There is a callus in that area with plans for debridement but the patient was referred for vascular evaluation before doing that. He underwent vascular studies in our office which showed normal ABI on the right side and mildly reduced on the left side at 0.93. Duplex showed occlusion of the distal left popliteal artery and tibial peroneal trunk.    Past Medical History:  Diagnosis Date  . Atrial fibrillation ([place])   . CKD (chronic kidney disease)   . Diabetes mellitus   . Hx SBO    Last 2013 all treated conservatively  . Hypertension   . Prostate cancer ([place])   . Stroke ([place])   . Tachycardia-bradycardia syndrome ([place])   . Ventral hernia    x3    Past Surgical History:  Procedure Laterality Date  . CHOLECYSTECTOMY OPEN  03/31/2001   Dr Lindon Romp  . EXPLORATORY LAPAROTOMY    . HEMORRHOID SURGERY    . HIATAL HERNIA REPAIR  2002  . PACEMAKER INSERTION    . PERMANENT PACEMAKER GENERATOR CHANGE N/A 04/06/2012   Procedure: PERMANENT PACEMAKER GENERATOR CHANGE;  Surgeon: Evans Lance, MD;  Location: [facility] CATH LAB;  Service: Cardiovascular;   Laterality: N/A;  . SMALL INTESTINE SURGERY  09/16/2004   SBO resection, VH repair     Current Outpatient Prescriptions  Medication Sig Dispense Refill  . bisacodyl (BISACODYL) 5 MG EC tablet Take 5 mg by mouth daily as needed for moderate constipation.    Marland Kitchen latanoprost (XALATAN) 0.005 % ophthalmic solution Left eye nightly    . omeprazole (PRILOSEC) 20 MG capsule Take 20 mg by mouth daily as needed (for heartburn).    Marland Kitchen OVER THE COUNTER MEDICATION Take 1 tablet by mouth daily. "appetitie boost" for supplement    . polycarbophil (FIBERCON) 625 MG tablet Take 625 mg by mouth daily as needed for moderate constipation.    . Rivaroxaban (XARELTO) 15 MG TABS tablet Take 1 tablet (15 mg total) by mouth daily before supper. 30 tablet 1   No current facility-administered medications for this visit.     Allergies:   Patient has no known allergies.    Social History:  The patient  reports that he quit smoking about 61 years ago. He smoked 1.50 packs per day. He quit smokeless tobacco use about 63 years ago. His smokeless tobacco use included Snuff and Chew. He reports that he does not drink alcohol or use drugs.   Family History:  The patient's family history includes Cancer in his brother; Pneumonia in his father; Stroke in his brother and sister.    ROS:  Please see the history of present illness.   Otherwise, review  of systems are positive for none.   All other systems are reviewed and negative.    PHYSICAL EXAM: VS:  BP 124/86   Pulse 84   Ht 5\' 11"  (1.803 m)   Wt 161 lb (73 kg)   BMI 22.45 kg/m  , BMI Body mass index is 22.45 kg/m. GEN: Well nourished, well developed, in no acute distress  HEENT: normal  Neck: no JVD, carotid bruits, or masses Cardiac: RRR; no murmurs, rubs, or gallops,no edema  Respiratory:  clear to auscultation bilaterally, normal work of breathing GI: soft, nontender, nondistended, + BS MS: no deformity or atrophy  Skin: warm and dry, no rash Neuro:   Strength and sensation are intact Psych: euthymic mood, full affect Vascular: Radial pulse is +1 bilaterally. Femoral pulses: +2 bilaterally. Distal pulses are not palpable. There is a small callus at the tip of the left fourth toe with ulceration under it and black discoloration of the toe  EKG:  EKG is ordered today. The ekg ordered today demonstrates  ventricular paced rhythm with underlying atrial fibrillation.   Recent Labs: No results found for requested labs within last 8760 hours.    Lipid Panel    Component Value Date/Time   CHOL 122 07/23/2015 1254   TRIG 55 07/23/2015 1254   HDL 43 07/23/2015 1254   CHOLHDL 2.8 07/23/2015 1254   VLDL 11 07/23/2015 1254   LDLCALC 68 07/23/2015 1254      Wt Readings from Last 3 Encounters:  07/30/16 161 lb (73 kg)  04/09/16 156 lb 3.2 oz (70.9 kg)  09/25/15 158 lb (71.7 kg)       No flowsheet data found.    ASSESSMENT AND PLAN:  1.  Peripheral arterial disease: Ischemic left fourth toe with black discoloration and small ulceration. ABI seems to be falsely elevated and duplex showed occluded distal left popliteal artery and TP trunk. The patient is at high risk for progression of limb ischemia and poor healing. I discussed different management options and recommended proceeding with abdominal aortogram, left lower extremity angiography and possible endovascular intervention. I discussed the procedure in details as well as risks and benefits. Biggest stress is going to be the risk of stroke while he is off Xarelto. I would hold Xarelto only for 2 days and I'm going to start him on aspirin 81 mg once daily.  2. Chronic atrial fibrillation: He is currently in ventricular paced rhythm.    Disposition:   FU with me in 1 month  Signed,  Kathlyn Sacramento, MD  07/30/2016 10:06 AM    [place]

## 2016-08-07 NOTE — Discharge Instructions (Signed)
Angiogram, Care After This sheet gives you information about how to care for yourself after your procedure. Your doctor may also give you more specific instructions. If you have problems or questions, contact your doctor. Follow these instructions at home: Insertion site care   Follow instructions from your doctor about how to take care of your long, thin tube (catheter) insertion area. Make sure you:  Wash your hands with soap and water before you change your bandage (dressing). If you cannot use soap and water, use hand sanitizer.  Change your bandage as told by your doctor.  Leave stitches (sutures), skin glue, or skin tape (adhesive) strips in place. They may need to stay in place for 2 weeks or longer. If tape strips get loose and curl up, you may trim the loose edges. Do not remove tape strips completely unless your doctor says it is okay.  Do not take baths, swim, or use a hot tub until your doctor says it is okay.  You may shower 24-48 hours after the procedure or as told by your doctor.  Gently wash the area with plain soap and water.  Pat the area dry with a clean towel.  Do not rub the area. This may cause bleeding.  Do not apply powder or lotion to the area. Keep the area clean and dry.  Check your insertion area every day for signs of infection. Check for:  More redness, swelling, or pain.  Fluid or blood.  Warmth.  Pus or a bad smell. Activity   Rest as told by your doctor, usually for 1-2 days.  Do not lift anything that is heavier than 10 lbs. (4.5 kg) or as told by your doctor.  Do not drive for 24 hours if you were given a medicine to help you relax (sedative).  Do not drive or use heavy machinery while taking prescription pain medicine. General instructions   Go back to your normal activities as told by your doctor, usually in about a week. Ask your doctor what activities are safe for you.  If the insertion area starts to bleed, lie flat and put  pressure on the area. If the bleeding does not stop, get help right away. This is an emergency.  Drink enough fluid to keep your pee (urine) clear or pale yellow.  Take over-the-counter and prescription medicines only as told by your doctor.  Keep all follow-up visits as told by your doctor. This is important. Contact a doctor if:  You have a fever.  You have chills.  You have more redness, swelling, or pain around your insertion area.  You have fluid or blood coming from your insertion area.  The insertion area feels warm to the touch.  You have pus or a bad smell coming from your insertion area.  You have more bruising around the insertion area.  Blood collects in the tissue around the insertion area (hematoma) that may be painful to the touch. Get help right away if:  You have a lot of pain in the insertion area.  The insertion area swells very fast.  The insertion area is bleeding, and the bleeding does not stop after holding steady pressure on the area.  The area near or just beyond the insertion area becomes pale, cool, tingly, or numb. These symptoms may be an emergency. Do not wait to see if the symptoms will go away. Get medical help right away. Call your local emergency services (911 in the [place] Do not drive yourself to  the hospital. Summary  After the procedure, it is common to have bruising and tenderness at the long, thin tube insertion area.  After the procedure, it is important to rest and drink plenty of fluids.  Do not take baths, swim, or use a hot tub until your doctor says it is okay to do so. You may shower 24-48 hours after the procedure or as told by your doctor.  If the insertion area starts to bleed, lie flat and put pressure on the area. If the bleeding does not stop, get help right away. This is an emergency. This information is not intended to replace advice given to you by your health care provider. Make sure you discuss any questions you have  with your health care provider. Document Released: 08/02/2008 Document Revised: 04/30/2016 Document Reviewed: 04/30/2016 Elsevier Interactive Patient Education  2017 [place] tomorrow if no bleeding issues from the groin  PV Procedure Wednesday 08/14/16 @ 12:30,  Please arrive at hospital at 10:30,  Come to [facility] tower check in at [place].  Nothing to eat of drink after midnight on Tuesday night.    Hold Xarelto on Monday 3/26 and Tuesday 3/27

## 2016-08-08 ENCOUNTER — Encounter (HOSPITAL_COMMUNITY): Payer: Self-pay | Admitting: Cardiovascular Disease

## 2016-08-08 ENCOUNTER — Telehealth: Payer: Self-pay | Admitting: Cardiovascular Disease

## 2016-08-08 NOTE — Telephone Encounter (Signed)
New message    Pt grand-daughter is calling saying that mg was changed and was not called into the pharmacy.   *STAT* If patient is at the pharmacy, call can be transferred to refill team.   1. Which medications need to be refilled? (please list name of each medication and dose if known) xarelto 10 mg  2. Which pharmacy/location (including street and city if local pharmacy) is medication to be sent to? [facility] on [address]  3. Do they need a 30 day or 90 day supply? 30 day

## 2016-08-08 NOTE — Telephone Encounter (Signed)
LMOM; need to clarify Xarelto dose and prefer pharmacy.  Xarelto 15mg  rx sent to [facility] yesterday.  No notes indicating change to Xarelto 10mg  noted.

## 2016-08-09 NOTE — Telephone Encounter (Signed)
[name] ( Granddaughter) is calling to follow up on her grandfather prescription for Xarelto. Stating that Dr. Debara Pickett changed the dosage to 10 mg . The Pharmacy is needing a new prescription sent with the dosage change . Please call

## 2016-08-09 NOTE — Telephone Encounter (Signed)
Clarified dose with Dr. Fletcher Anon:   Message  Received: Today  Message Contents  Matthew Hampshire, MD  Rockne Menghini, RPH-CPP        It was listed that he was taking 20 mg Xarelto which was not an appropriate dose for him. I decreased the dose to 15 mg daily but after we called the pharmacy it turned out that he was already on the 15 mg dose.  He should stay on the 15 mg Xarelto.     called granddaughter and LMOM explaining the situation, gave her my direct phone number should she have further questions.  Rx sent to [facility] on Thursday.

## 2016-08-12 ENCOUNTER — Encounter (HOSPITAL_COMMUNITY): Payer: Self-pay

## 2016-08-12 ENCOUNTER — Emergency Department (HOSPITAL_COMMUNITY)
Admission: EM | Admit: 2016-08-12 | Discharge: 2016-08-12 | Disposition: A | Discharge status: Home / Self Care | Payer: Medicare Other | Attending: Emergency Medicine | Admitting: Emergency Medicine

## 2016-08-12 DIAGNOSIS — Z7901 Long term (current) use of anticoagulants: Secondary | ICD-10-CM | POA: Insufficient documentation

## 2016-08-12 DIAGNOSIS — Z8546 Personal history of malignant neoplasm of prostate: Secondary | ICD-10-CM | POA: Diagnosis not present

## 2016-08-12 DIAGNOSIS — Z7982 Long term (current) use of aspirin: Secondary | ICD-10-CM | POA: Insufficient documentation

## 2016-08-12 DIAGNOSIS — N189 Chronic kidney disease, unspecified: Secondary | ICD-10-CM | POA: Insufficient documentation

## 2016-08-12 DIAGNOSIS — E1122 Type 2 diabetes mellitus with diabetic chronic kidney disease: Secondary | ICD-10-CM | POA: Insufficient documentation

## 2016-08-12 DIAGNOSIS — Z8673 Personal history of transient ischemic attack (TIA), and cerebral infarction without residual deficits: Secondary | ICD-10-CM | POA: Insufficient documentation

## 2016-08-12 DIAGNOSIS — Z87891 Personal history of nicotine dependence: Secondary | ICD-10-CM | POA: Insufficient documentation

## 2016-08-12 DIAGNOSIS — I129 Hypertensive chronic kidney disease with stage 1 through stage 4 chronic kidney disease, or unspecified chronic kidney disease: Secondary | ICD-10-CM | POA: Insufficient documentation

## 2016-08-12 DIAGNOSIS — R21 Rash and other nonspecific skin eruption: Secondary | ICD-10-CM

## 2016-08-12 LAB — COMPREHENSIVE METABOLIC PANEL
ALT: 10 U/L — ABNORMAL LOW (ref 17–63)
ANION GAP: 8 (ref 5–15)
AST: 21 U/L (ref 15–41)
Albumin: 3.6 g/dL (ref 3.5–5.0)
Alkaline Phosphatase: 65 U/L (ref 38–126)
BUN: 15 mg/dL (ref 6–20)
CALCIUM: 8.2 mg/dL — AB (ref 8.9–10.3)
CHLORIDE: 109 mmol/L (ref 101–111)
CO2: 24 mmol/L (ref 22–32)
Creatinine, Ser: 1.54 mg/dL — ABNORMAL HIGH (ref 0.61–1.24)
GFR calc non Af Amer: 38 mL/min — ABNORMAL LOW (ref >60)
GFR, EST AFRICAN AMERICAN: 44 mL/min — AB (ref >60)
Glucose, Bld: 111 mg/dL — ABNORMAL HIGH (ref 65–99)
Potassium: 4 mmol/L (ref 3.5–5.1)
SODIUM: 141 mmol/L (ref 135–145)
Total Bilirubin: 1.1 mg/dL (ref 0.3–1.2)
Total Protein: 6.3 g/dL — ABNORMAL LOW (ref 6.5–8.1)

## 2016-08-12 LAB — CBC
HCT: 31.3 % — ABNORMAL LOW (ref 39.0–52.0)
Hemoglobin: 10.2 g/dL — ABNORMAL LOW (ref 13.0–17.0)
MCH: 25.5 pg — AB (ref 26.0–34.0)
MCHC: 32.6 g/dL (ref 30.0–36.0)
MCV: 78.3 fL (ref 78.0–100.0)
PLATELETS: 131 10*3/uL — AB (ref 150–400)
RBC: 4 MIL/uL — ABNORMAL LOW (ref 4.22–5.81)
RDW: 16.2 % — ABNORMAL HIGH (ref 11.5–15.5)
WBC: 6.7 10*3/uL (ref 4.0–10.5)

## 2016-08-12 MED ORDER — PREDNISONE 20 MG PO TABS
40.0000 mg | ORAL_TABLET | Freq: Once | ORAL | Status: AC
  Administered 2016-08-12: 40 mg via ORAL
  Filled 2016-08-12: qty 2

## 2016-08-12 MED ORDER — PREDNISONE 10 MG PO TABS
20.0000 mg | ORAL_TABLET | Freq: Every day | ORAL | 0 refills | Status: DC
Start: 2016-08-12 — End: 2016-10-28

## 2016-08-12 MED ORDER — DIPHENHYDRAMINE HCL 25 MG PO CAPS
25.0000 mg | ORAL_CAPSULE | Freq: Once | ORAL | Status: AC
  Administered 2016-08-12: 25 mg via ORAL
  Filled 2016-08-12: qty 1

## 2016-08-12 NOTE — ED Notes (Signed)
ED Provider at bedside. 

## 2016-08-12 NOTE — ED Provider Notes (Addendum)
[facility] DEPT Provider Note   CSN: [id number] Arrival date & time: 08/12/16  0905     History   Chief Complaint No chief complaint on file.   HPI AKSH SWART is a 83 y.o. male.  HPI  83 year old man history of chronic A. fib on anticoagulation, chronic kidney disease, diabetes, status post recent lower extremity angiogram for peripheral vascular disease presents today complaining of itching and rash. He states that it began on Saturday with some rash on his chest. He is unable tell me when it spread states he has a rash on all his extremities now. He denies any mouth lesions, eye lesions, or burning with urination. He has not been short of breath or lightheaded. He denies any previous similar symptoms. He is not on any new medications but has had his dose of Xarelto adjusted.  Past Medical History:  Diagnosis Date  . Atrial fibrillation ([place])   . CKD (chronic kidney disease)   . Diabetes mellitus   . Hx SBO    Last 2013 all treated conservatively  . Hypertension   . Prostate cancer ([place])   . Stroke ([place])   . Tachycardia-bradycardia syndrome ([place])   . Ventral hernia    x3    Patient Active Problem List   Diagnosis Date Noted  . CVA (cerebral infarction) 07/23/2015  . Anemia 07/23/2015  . Stroke (cerebrum) ([place]) 07/23/2015  . Chronic kidney disease 07/23/2015  . Acute CVA (cerebrovascular accident) ([place])   . Ventral hernia with obstruction 08/17/2013  . Hypokalemia 08/17/2013  . SBO (small bowel obstruction) 02/14/2012  . Abdominal pain 02/14/2012  . Nausea & vomiting 02/14/2012  . PPM-Medtronic 03/13/2010  . Diabetes ([place]) 11/14/2008  . Atrial fibrillation ([place]) 11/14/2008  . BRADYCARDIA-TACHYCARDIA SYNDROME 11/14/2008    Past Surgical History:  Procedure Laterality Date  . ABDOMINAL AORTOGRAM W/LOWER EXTREMITY N/A 08/07/2016   Procedure: Abdominal Aortogram w/Lower Extremity;  Surgeon: Wellington Hampshire, MD;  Location: [facility] CV LAB;  Service:  Cardiovascular;  Laterality: N/A;  . CHOLECYSTECTOMY OPEN  03/31/2001   Dr Lindon Romp  . EXPLORATORY LAPAROTOMY    . HEMORRHOID SURGERY    . HIATAL HERNIA REPAIR  2002  . PACEMAKER INSERTION    . PERIPHERAL VASCULAR BALLOON ANGIOPLASTY  08/07/2016   Procedure: Peripheral Vascular Balloon Angioplasty;  Surgeon: Wellington Hampshire, MD;  Location: [facility] CV LAB;  Service: Cardiovascular;;  left popliteal artery  . PERMANENT PACEMAKER GENERATOR CHANGE N/A 04/06/2012   Procedure: PERMANENT PACEMAKER GENERATOR CHANGE;  Surgeon: Evans Lance, MD;  Location: [facility] CATH LAB;  Service: Cardiovascular;  Laterality: N/A;  . SMALL INTESTINE SURGERY  09/16/2004   SBO resection, VH repair       Home Medications    Prior to Admission medications   Medication Sig Start Date End Date Taking? Authorizing Provider  aspirin EC 81 MG tablet Take 1 tablet (81 mg total) by mouth daily. Patient taking differently: Take 81 mg by mouth every evening.  07/30/16   Wellington Hampshire, MD  bisacodyl (BISACODYL) 5 MG EC tablet Take 5 mg by mouth daily as needed for moderate constipation.    Historical Provider, MD  latanoprost (XALATAN) 0.005 % ophthalmic solution Place 1 drop into the left eye at bedtime.  02/14/16   Historical Provider, MD  losartan-hydrochlorothiazide (HYZAAR) 100-12.5 MG tablet Take 1 tablet by mouth every evening.    Historical Provider, MD  metoprolol succinate (TOPROL-XL) 50 MG 24 hr tablet Take 50 mg by mouth every  evening.    Historical Provider, MD  omeprazole (PRILOSEC) 20 MG capsule Take 20 mg by mouth every evening.     Historical Provider, MD  OVER THE COUNTER MEDICATION Take 1 tablet by mouth daily. "appetitie boost" for supplement    Historical Provider, MD  polycarbophil (FIBERCON) 625 MG tablet Take 625 mg by mouth daily as needed for moderate constipation.    Historical Provider, MD  rivaroxaban (XARELTO) 15 MG TABS tablet Take 1 tablet (15 mg total) by mouth daily with supper. 08/07/16    Wellington Hampshire, MD    Family History Family History  Problem Relation Age of Onset  . Pneumonia Father   . Stroke Brother   . Stroke Sister   . Cancer Brother     type unknown    Social History Social History  Substance Use Topics  . Smoking status: Former Smoker    Packs/day: 1.50    Quit date: 04/16/1955  . Smokeless tobacco: Former Systems developer    Types: Snuff, Sarina Ser    Quit date: 08/17/1952  . Alcohol use No     Allergies   Patient has no known allergies.   Review of Systems Review of Systems  All other systems reviewed and are negative.    Physical Exam Updated Vital Signs BP (!) 158/87   Pulse 66   Temp 98 F (36.7 C) (Oral)   Resp 18   SpO2 94%   Physical Exam  Constitutional: He is oriented to person, place, and time. He appears well-developed and well-nourished.  Patient is very hard of hearing  HENT:  Head: Normocephalic and atraumatic.  Right Ear: External ear normal.  Mouth/Throat: Oropharynx is clear and moist.  Eyes: EOM are normal. Pupils are equal, round, and reactive to light.  Neck: Normal range of motion.  Cardiovascular: Normal rate and regular rhythm.   Pulmonary/Chest: Effort normal and breath sounds normal.  Abdominal: Soft.  Well-healed midline scar with hernia that is easily reducible noted  Musculoskeletal: Normal range of motion. He exhibits no edema.  Right groin dressing in place  Neurological: He is alert and oriented to person, place, and time.  Skin: Skin is warm and dry. Capillary refill takes less than 2 seconds. Rash noted.  Diffuse maculopapular rash present on chest, back, bilateral lower extremities, and bilateral upper extremities greater in the proximal region of the upper extremities. It is non-blanching. There is some achy noted in the upper back. No mucous membrane involvement is noted  Psychiatric: He has a normal mood and affect.  Nursing note and vitals reviewed.    ED Treatments / Results  Labs (all labs  ordered are listed, but only abnormal results are displayed) Labs Reviewed  CBC - Abnormal; Notable for the following:       Result Value   RBC 4.00 (*)    Hemoglobin 10.2 (*)    HCT 31.3 (*)    MCH 25.5 (*)    RDW 16.2 (*)    Platelets 131 (*)    All other components within normal limits  COMPREHENSIVE METABOLIC PANEL - Abnormal; Notable for the following:    Glucose, Bld 111 (*)    Creatinine, Ser 1.54 (*)    Calcium 8.2 (*)    Total Protein 6.3 (*)    ALT 10 (*)    GFR calc non Af Amer 38 (*)    GFR calc Af Amer 44 (*)    All other components within normal limits    EKG  EKG Interpretation None       Radiology No results found.  Procedures Procedures (including critical care time)  Medications Ordered in ED Medications - No data to display   Initial Impression / Assessment and Plan / ED Course  I have reviewed the triage vital signs and the nursing notes.  Pertinent labs & imaging results that were available during my care of the patient were reviewed by me and considered in my medical decision making (see chart for details).     Patient improved after Benadryl and prednisone. He states itching has decreased. Patient states that he is scheduled for angiogram on Wednesday. I spoke with Dr. Carlis Abbott, who is his primary care physician to arrange follow-up. Dr. Carlis Abbott to see him in the office on Thursday. We have discussed return precautions and need for close follow-up and he voices understanding. Review of vital signs remain STABLE with one isolated low blood pressure as been rechecked and is normal. Final Clinical Impressions(s) / ED Diagnoses   Final diagnoses:  Rash   Renal insufficiency stable Anemia mild consistent with previous measurements New Prescriptions New Prescriptions   PREDNISONE (DELTASONE) 10 MG TABLET    Take 2 tablets (20 mg total) by mouth daily.     Pattricia Boss, MD 08/12/16 1331    Pattricia Boss, MD 08/12/16 1332    Pattricia Boss,  MD 08/12/16 0569

## 2016-08-12 NOTE — ED Notes (Signed)
Pt has rash all over body for several days that is itching so badly that he has been unable to sleep. Rash appears worse on bilat legs than trunk.

## 2016-08-12 NOTE — ED Triage Notes (Signed)
Patient complains of generalized itching to body since last pm, NAD. Very HOH. States that he has no new exposures and has not taken anything

## 2016-08-12 NOTE — Discharge Instructions (Signed)
Take prednisone as prescribed. He may use Benadryl 25 mg every 6-8 hours as needed for itching. If rash is worsening, please return for recheck. Otherwise follow-up with Dr. Carlis Abbott on Thursday morning at 9 AM

## 2016-08-14 ENCOUNTER — Ambulatory Visit (HOSPITAL_COMMUNITY)
Admission: RE | Admit: 2016-08-14 | Discharge: 2016-08-15 | Disposition: A | Discharge status: Home / Self Care | Payer: Medicare Other | Source: Ambulatory Visit | Attending: Cardiovascular Disease | Admitting: Cardiovascular Disease

## 2016-08-14 ENCOUNTER — Ambulatory Visit (HOSPITAL_COMMUNITY)
Admission: RE | Disposition: A | Discharge status: Home / Self Care | Payer: Self-pay | Source: Ambulatory Visit | Attending: Cardiovascular Disease

## 2016-08-14 ENCOUNTER — Encounter (HOSPITAL_COMMUNITY): Payer: Self-pay | Admitting: General Practice

## 2016-08-14 DIAGNOSIS — Z833 Family history of diabetes mellitus: Secondary | ICD-10-CM | POA: Diagnosis not present

## 2016-08-14 DIAGNOSIS — Z950 Presence of cardiac pacemaker: Secondary | ICD-10-CM | POA: Diagnosis not present

## 2016-08-14 DIAGNOSIS — E1122 Type 2 diabetes mellitus with diabetic chronic kidney disease: Secondary | ICD-10-CM | POA: Diagnosis not present

## 2016-08-14 DIAGNOSIS — I482 Chronic atrial fibrillation: Secondary | ICD-10-CM | POA: Insufficient documentation

## 2016-08-14 DIAGNOSIS — I129 Hypertensive chronic kidney disease with stage 1 through stage 4 chronic kidney disease, or unspecified chronic kidney disease: Secondary | ICD-10-CM | POA: Insufficient documentation

## 2016-08-14 DIAGNOSIS — N189 Chronic kidney disease, unspecified: Secondary | ICD-10-CM | POA: Diagnosis not present

## 2016-08-14 DIAGNOSIS — Z7901 Long term (current) use of anticoagulants: Secondary | ICD-10-CM | POA: Diagnosis not present

## 2016-08-14 DIAGNOSIS — I70245 Atherosclerosis of native arteries of left leg with ulceration of other part of foot: Secondary | ICD-10-CM | POA: Insufficient documentation

## 2016-08-14 DIAGNOSIS — I739 Peripheral vascular disease, unspecified: Secondary | ICD-10-CM | POA: Diagnosis present

## 2016-08-14 DIAGNOSIS — R21 Rash and other nonspecific skin eruption: Secondary | ICD-10-CM | POA: Insufficient documentation

## 2016-08-14 DIAGNOSIS — E1151 Type 2 diabetes mellitus with diabetic peripheral angiopathy without gangrene: Secondary | ICD-10-CM | POA: Diagnosis not present

## 2016-08-14 DIAGNOSIS — L97529 Non-pressure chronic ulcer of other part of left foot with unspecified severity: Secondary | ICD-10-CM | POA: Insufficient documentation

## 2016-08-14 DIAGNOSIS — Z8673 Personal history of transient ischemic attack (TIA), and cerebral infarction without residual deficits: Secondary | ICD-10-CM | POA: Diagnosis not present

## 2016-08-14 DIAGNOSIS — I10 Essential (primary) hypertension: Secondary | ICD-10-CM | POA: Diagnosis present

## 2016-08-14 DIAGNOSIS — I70262 Atherosclerosis of native arteries of extremities with gangrene, left leg: Secondary | ICD-10-CM | POA: Diagnosis not present

## 2016-08-14 DIAGNOSIS — Z87891 Personal history of nicotine dependence: Secondary | ICD-10-CM | POA: Diagnosis not present

## 2016-08-14 HISTORY — PX: PERIPHERAL VASCULAR INTERVENTION: CATH118257

## 2016-08-14 HISTORY — DX: Unspecified osteoarthritis, unspecified site: M19.90

## 2016-08-14 HISTORY — DX: Type 2 diabetes mellitus without complications: E11.9

## 2016-08-14 HISTORY — DX: Personal history of other diseases of the digestive system: Z87.19

## 2016-08-14 HISTORY — DX: Presence of cardiac pacemaker: Z95.0

## 2016-08-14 LAB — GLUCOSE, CAPILLARY
GLUCOSE-CAPILLARY: 116 mg/dL — AB (ref 65–99)
Glucose-Capillary: 86 mg/dL (ref 65–99)

## 2016-08-14 LAB — POCT ACTIVATED CLOTTING TIME
ACTIVATED CLOTTING TIME: 241 s
ACTIVATED CLOTTING TIME: 290 s
ACTIVATED CLOTTING TIME: 252 s
Activated Clotting Time: 263 seconds

## 2016-08-14 SURGERY — PERIPHERAL VASCULAR INTERVENTION
Anesthesia: LOCAL

## 2016-08-14 MED ORDER — MIDAZOLAM HCL 2 MG/2ML IJ SOLN
INTRAMUSCULAR | Status: DC | PRN
  Administered 2016-08-14 (×2): 1 mg via INTRAVENOUS

## 2016-08-14 MED ORDER — SODIUM CHLORIDE 0.9 % IV SOLN: 250.0000 mL | INTRAVENOUS | Status: DC | PRN

## 2016-08-14 MED ORDER — FENTANYL CITRATE (PF) 100 MCG/2ML IJ SOLN
INTRAMUSCULAR | Status: AC
  Filled 2016-08-14: qty 2

## 2016-08-14 MED ORDER — SODIUM CHLORIDE 0.9% FLUSH
3.0000 mL | Freq: Two times a day (BID) | INTRAVENOUS | Status: DC
  Administered 2016-08-15: 3 mL via INTRAVENOUS

## 2016-08-14 MED ORDER — BISACODYL 5 MG PO TBEC: 5.0000 mg | DELAYED_RELEASE_TABLET | Freq: Every day | ORAL | Status: DC | PRN

## 2016-08-14 MED ORDER — MIDAZOLAM HCL 2 MG/2ML IJ SOLN
INTRAMUSCULAR | Status: AC
  Filled 2016-08-14: qty 2

## 2016-08-14 MED ORDER — NITROGLYCERIN 1 MG/10 ML FOR IR/CATH LAB
INTRA_ARTERIAL | Status: DC | PRN
  Administered 2016-08-14 (×2): 200 ug via INTRA_ARTERIAL
  Administered 2016-08-14: 300 ug via INTRA_ARTERIAL

## 2016-08-14 MED ORDER — IODIXANOL 320 MG/ML IV SOLN
INTRAVENOUS | Status: DC | PRN
  Administered 2016-08-14: 90 mL via INTRAVENOUS

## 2016-08-14 MED ORDER — LOSARTAN POTASSIUM-HCTZ 100-12.5 MG PO TABS: 1.0000 | ORAL_TABLET | Freq: Every evening | ORAL | Status: DC

## 2016-08-14 MED ORDER — LOSARTAN POTASSIUM 50 MG PO TABS
100.0000 mg | ORAL_TABLET | Freq: Every day | ORAL | Status: DC
  Administered 2016-08-14 – 2016-08-15 (×2): 100 mg via ORAL
  Filled 2016-08-14 (×3): qty 2

## 2016-08-14 MED ORDER — ONDANSETRON HCL 4 MG/2ML IJ SOLN: 4.0000 mg | Freq: Four times a day (QID) | INTRAMUSCULAR | Status: DC | PRN

## 2016-08-14 MED ORDER — ASPIRIN 81 MG PO CHEW: 81.0000 mg | CHEWABLE_TABLET | ORAL | Status: DC

## 2016-08-14 MED ORDER — LIDOCAINE HCL (PF) 1 % IJ SOLN
INTRAMUSCULAR | Status: AC
  Filled 2016-08-14: qty 30

## 2016-08-14 MED ORDER — HEPARIN (PORCINE) IN NACL 2-0.9 UNIT/ML-% IJ SOLN
INTRAMUSCULAR | Status: AC
  Filled 2016-08-14: qty 1000

## 2016-08-14 MED ORDER — CLOPIDOGREL BISULFATE 300 MG PO TABS
ORAL_TABLET | ORAL | Status: DC | PRN
  Administered 2016-08-14: 600 mg via ORAL

## 2016-08-14 MED ORDER — HEPARIN SODIUM (PORCINE) 1000 UNIT/ML IJ SOLN
INTRAMUSCULAR | Status: AC
  Filled 2016-08-14: qty 1

## 2016-08-14 MED ORDER — CLOPIDOGREL BISULFATE 75 MG PO TABS
75.0000 mg | ORAL_TABLET | Freq: Every day | ORAL | Status: DC
  Administered 2016-08-15: 10:00:00 75 mg via ORAL
  Filled 2016-08-14: qty 1

## 2016-08-14 MED ORDER — HEPARIN (PORCINE) IN NACL 2-0.9 UNIT/ML-% IJ SOLN
INTRAMUSCULAR | Status: DC | PRN
  Administered 2016-08-14: 2000 mL

## 2016-08-14 MED ORDER — ACETAMINOPHEN 325 MG PO TABS: 650.0000 mg | ORAL_TABLET | ORAL | Status: DC | PRN

## 2016-08-14 MED ORDER — LATANOPROST 0.005 % OP SOLN
1.0000 [drp] | Freq: Every day | OPHTHALMIC | Status: DC
  Administered 2016-08-14: 1 [drp] via OPHTHALMIC
  Filled 2016-08-14: qty 2.5

## 2016-08-14 MED ORDER — CALCIUM POLYCARBOPHIL 625 MG PO TABS
625.0000 mg | ORAL_TABLET | Freq: Every day | ORAL | Status: DC | PRN
  Filled 2016-08-14: qty 1

## 2016-08-14 MED ORDER — SODIUM CHLORIDE 0.9% FLUSH: 3.0000 mL | INTRAVENOUS | Status: DC | PRN

## 2016-08-14 MED ORDER — ATROPINE SULFATE 1 MG/10ML IJ SOSY
PREFILLED_SYRINGE | INTRAMUSCULAR | Status: AC
  Filled 2016-08-14: qty 10

## 2016-08-14 MED ORDER — ASPIRIN 81 MG PO CHEW
CHEWABLE_TABLET | ORAL | Status: AC
  Administered 2016-08-14: 81 mg
  Filled 2016-08-14: qty 1

## 2016-08-14 MED ORDER — SODIUM CHLORIDE 0.9% FLUSH: 3.0000 mL | Freq: Two times a day (BID) | INTRAVENOUS | Status: DC

## 2016-08-14 MED ORDER — HYDROCHLOROTHIAZIDE 12.5 MG PO CAPS
12.5000 mg | ORAL_CAPSULE | Freq: Every day | ORAL | Status: DC
  Administered 2016-08-14 – 2016-08-15 (×2): 12.5 mg via ORAL
  Filled 2016-08-14 (×2): qty 1

## 2016-08-14 MED ORDER — NITROGLYCERIN 1 MG/10 ML FOR IR/CATH LAB
INTRA_ARTERIAL | Status: AC
  Filled 2016-08-14: qty 10

## 2016-08-14 MED ORDER — VERAPAMIL HCL 2.5 MG/ML IV SOLN
INTRAVENOUS | Status: AC
  Filled 2016-08-14: qty 2

## 2016-08-14 MED ORDER — SODIUM CHLORIDE 0.9 % WEIGHT BASED INFUSION
3.0000 mL/kg/h | INTRAVENOUS | Status: DC
  Administered 2016-08-14: 3 mL/kg/h via INTRAVENOUS

## 2016-08-14 MED ORDER — SODIUM CHLORIDE 0.9 % WEIGHT BASED INFUSION
1.0000 mL/kg/h | INTRAVENOUS | Status: AC
  Administered 2016-08-14: 1 mL/kg/h via INTRAVENOUS

## 2016-08-14 MED ORDER — METOPROLOL SUCCINATE ER 50 MG PO TB24
50.0000 mg | ORAL_TABLET | Freq: Every evening | ORAL | Status: DC
  Administered 2016-08-14: 18:00:00 50 mg via ORAL
  Filled 2016-08-14: qty 1

## 2016-08-14 MED ORDER — SODIUM CHLORIDE 0.9 % WEIGHT BASED INFUSION: 1.0000 mL/kg/h | INTRAVENOUS | Status: DC

## 2016-08-14 MED ORDER — FENTANYL CITRATE (PF) 100 MCG/2ML IJ SOLN
INTRAMUSCULAR | Status: DC | PRN
  Administered 2016-08-14: 25 ug via INTRAVENOUS
  Administered 2016-08-14: 50 ug via INTRAVENOUS
  Administered 2016-08-14: 25 ug via INTRAVENOUS

## 2016-08-14 MED ORDER — LIDOCAINE HCL (PF) 1 % IJ SOLN
INTRAMUSCULAR | Status: DC | PRN
  Administered 2016-08-14: 12 mL
  Administered 2016-08-14: 2 mL

## 2016-08-14 MED ORDER — BOOST / RESOURCE BREEZE PO LIQD
1.0000 | Freq: Three times a day (TID) | ORAL | Status: DC
  Administered 2016-08-15: 237 mL via ORAL
  Filled 2016-08-14 (×8): qty 1

## 2016-08-14 MED ORDER — CLOPIDOGREL BISULFATE 300 MG PO TABS
ORAL_TABLET | ORAL | Status: AC
  Filled 2016-08-14: qty 2

## 2016-08-14 MED ORDER — HEPARIN SODIUM (PORCINE) 1000 UNIT/ML IJ SOLN
INTRAMUSCULAR | Status: DC | PRN
  Administered 2016-08-14: 2000 [IU] via INTRAVENOUS
  Administered 2016-08-14: 3000 [IU] via INTRAVENOUS
  Administered 2016-08-14 (×2): 2000 [IU] via INTRAVENOUS
  Administered 2016-08-14: 10000 [IU] via INTRAVENOUS

## 2016-08-14 MED ORDER — ASPIRIN EC 81 MG PO TBEC
81.0000 mg | DELAYED_RELEASE_TABLET | Freq: Every evening | ORAL | Status: DC
  Administered 2016-08-14: 81 mg via ORAL
  Filled 2016-08-14: qty 1

## 2016-08-14 MED ORDER — ANGIOPLASTY BOOK
Freq: Once | Status: AC
  Administered 2016-08-14: 22:00:00
  Filled 2016-08-14: qty 1

## 2016-08-14 SURGICAL SUPPLY — 43 items
BALLN MUSTANG 4X40X135 (BALLOONS) ×3
BALLN STERLING OTW 3X100X150 (BALLOONS) ×3
BALLN STERLING OTW 3X150X150 (BALLOONS) ×3
BALLN STERLING OTW 6X80X135 (BALLOONS) ×3
BALLOON MUSTANG 4X40X135 (BALLOONS) ×2 IMPLANT
BALLOON STERLING OTW 3X100X150 (BALLOONS) ×2 IMPLANT
BALLOON STERLING OTW 3X150X150 (BALLOONS) ×2 IMPLANT
BALLOON STERLING OTW 6X80X135 (BALLOONS) ×2 IMPLANT
CATH CROSS OVER TEMPO 5F (CATHETERS) ×3 IMPLANT
CATH CXI 2.6F 65 ANG (CATHETERS) ×1
CATH QUICKCROSS .018X135CM (MICROCATHETER) ×3 IMPLANT
CATH QUICKCROSS SUPP .018X90CM (MICROCATHETER) ×3 IMPLANT
CATH SPRT ANG 65X2.6FR BRD (CATHETERS) ×2 IMPLANT
CATH TEMPO AQUA 5F 100CM (CATHETERS) ×3 IMPLANT
COVER PRB 48X5XTLSCP FOLD TPE (BAG) ×2 IMPLANT
COVER PROBE 5X48 (BAG) ×1
DEVICE CONTINUOUS FLUSH (MISCELLANEOUS) ×3 IMPLANT
DEVICE ONE SNARE 15MM (VASCULAR PRODUCTS) ×3 IMPLANT
DEVICE TORQUE .014-.018 (MISCELLANEOUS) ×2 IMPLANT
GLIDEWIRE ANGLED NITR .018X260 (WIRE) ×3 IMPLANT
GUIDEWIRE ANGLED .035X260CM (WIRE) ×3 IMPLANT
GUIDEWIRE ASTATO XS 20G 300CM (WIRE) ×3 IMPLANT
HEMOSTASIS PAD V PLUS (HEMOSTASIS) ×3 IMPLANT
KIT ENCORE 26 ADVANTAGE (KITS) ×3 IMPLANT
KIT MICROINTRODUCER STIFF 5F (SHEATH) ×3 IMPLANT
KIT PV (KITS) ×3 IMPLANT
SHEATH MICROPUNCTURE PEDAL 4FR (SHEATH) ×3 IMPLANT
SHEATH PINNACLE 5F 10CM (SHEATH) ×3 IMPLANT
SHEATH PINNACLE MP 6F 45CM (SHEATH) ×3 IMPLANT
SHIELD RADPAD SCOOP 12X17 (MISCELLANEOUS) ×3 IMPLANT
STENT PROMUS PREM MR 4.0X38 (Permanent Stent) ×3 IMPLANT
STENT SUPERA  6.5X80X120 (Permanent Stent) ×1 IMPLANT
STENT SUPERA 6.5X80X120 (Permanent Stent) ×2 IMPLANT
SYRINGE MEDRAD AVANTA MACH 7 (SYRINGE) IMPLANT
TORQUE DEVICE .014-.018 (MISCELLANEOUS) ×3
TRANSDUCER W/STOPCOCK (MISCELLANEOUS) ×3 IMPLANT
TRAY PV CATH (CUSTOM PROCEDURE TRAY) ×3 IMPLANT
TUBING CIL FLEX 10 FLL-RA (TUBING) ×3 IMPLANT
WIRE ASAHI CONFIANZA 300CM (WIRE) ×3 IMPLANT
WIRE G V18X300CM (WIRE) ×3 IMPLANT
WIRE HITORQ VERSACORE ST 145CM (WIRE) ×3 IMPLANT
WIRE RUNTHROUGH .014X300CM (WIRE) ×3 IMPLANT
WIRE VIPER ADVANCE .017X335CM (WIRE) ×3 IMPLANT

## 2016-08-14 NOTE — Interval H&P Note (Signed)
History and Physical Interval Note:  08/14/2016 1:24 PM  [name]  has presented today for surgery, with the diagnosis of claudication  The various methods of treatment have been discussed with the patient and family. After consideration of risks, benefits and other options for treatment, the patient has consented to  Procedure(s): Lower Extremity Angiography (N/A) as a surgical intervention .  The patient's history has been reviewed, patient examined, no change in status, stable for surgery.  I have reviewed the patient's chart and labs.  Questions were answered to the patient's satisfaction.     Kathlyn Sacramento

## 2016-08-14 NOTE — H&P (View-Only) (Signed)
Cardiology Office Note   Date:  07/30/2016   ID:  [name], DOB Sep 19, 1923, MRN [id number]  PCP:  Matthew Spurling, MD  Cardiologist:  Dr. Lovena Le  Chief Complaint  Patient presents with  . Follow-up    New patient. Absent pulses/Black toe.      History of Present Illness: [name] is a 83 y.o. male who Was referred by Dr. Blima Craig for evaluation and management of peripheral arterial disease. The patient has known history of chronic atrial fibrillation on long-term anticoagulation with Xarelto, symptomatic bradycardia status post permanent pacemaker placement, previous stroke one day after running out of Xarelto and diet-controlled diabetes. The patient developed ulceration on the left distal fourth toe about one year ago according to him. This became painful with dark lack discoloration in the last few weeks. There is a callus in that area with plans for debridement but the patient was referred for vascular evaluation before doing that. He underwent vascular studies in our office which showed normal ABI on the right side and mildly reduced on the left side at 0.93. Duplex showed occlusion of the distal left popliteal artery and tibial peroneal trunk.    Past Medical History:  Diagnosis Date  . Atrial fibrillation ([place])   . CKD (chronic kidney disease)   . Diabetes mellitus   . Hx SBO    Last 2013 all treated conservatively  . Hypertension   . Prostate cancer ([place])   . Stroke ([place])   . Tachycardia-bradycardia syndrome ([place])   . Ventral hernia    x3    Past Surgical History:  Procedure Laterality Date  . CHOLECYSTECTOMY OPEN  03/31/2001   Dr [name]  . EXPLORATORY LAPAROTOMY    . HEMORRHOID SURGERY    . HIATAL HERNIA REPAIR  2002  . PACEMAKER INSERTION    . PERMANENT PACEMAKER GENERATOR CHANGE N/A 04/06/2012   Procedure: PERMANENT PACEMAKER GENERATOR CHANGE;  Surgeon: Matthew Lance, MD;  Location: [facility] CATH LAB;  Service: Cardiovascular;   Laterality: N/A;  . SMALL INTESTINE SURGERY  09/16/2004   SBO resection, VH repair     Current Outpatient Prescriptions  Medication Sig Dispense Refill  . bisacodyl (BISACODYL) 5 MG EC tablet Take 5 mg by mouth daily as needed for moderate constipation.    Marland Kitchen latanoprost (XALATAN) 0.005 % ophthalmic solution Left eye nightly    . omeprazole (PRILOSEC) 20 MG capsule Take 20 mg by mouth daily as needed (for heartburn).    Marland Kitchen OVER THE COUNTER MEDICATION Take 1 tablet by mouth daily. "appetitie boost" for supplement    . polycarbophil (FIBERCON) 625 MG tablet Take 625 mg by mouth daily as needed for moderate constipation.    . Rivaroxaban (XARELTO) 15 MG TABS tablet Take 1 tablet (15 mg total) by mouth daily before supper. 30 tablet 1   No current facility-administered medications for this visit.     Allergies:   Patient has no known allergies.    Social History:  The patient  reports that he quit smoking about 61 years ago. He smoked 1.50 packs per day. He quit smokeless tobacco use about 63 years ago. His smokeless tobacco use included Snuff and Chew. He reports that he does not drink alcohol or use drugs.   Family History:  The patient's family history includes Cancer in his brother; Pneumonia in his father; Stroke in his brother and sister.    ROS:  Please see the history of present illness.   Otherwise, review  of systems are positive for none.   All other systems are reviewed and negative.    PHYSICAL EXAM: VS:  BP 124/86   Pulse 84   Ht 5\' 11"  (1.803 m)   Wt 161 lb (73 kg)   BMI 22.45 kg/m  , BMI Body mass index is 22.45 kg/m. GEN: Well nourished, well developed, in no acute distress  HEENT: normal  Neck: no JVD, carotid bruits, or masses Cardiac: RRR; no murmurs, rubs, or gallops,no edema  Respiratory:  clear to auscultation bilaterally, normal work of breathing GI: soft, nontender, nondistended, + BS MS: no deformity or atrophy  Skin: warm and dry, no rash Neuro:   Strength and sensation are intact Psych: euthymic mood, full affect Vascular: Radial pulse is +1 bilaterally. Femoral pulses: +2 bilaterally. Distal pulses are not palpable. There is a small callus at the tip of the left fourth toe with ulceration under it and black discoloration of the toe  EKG:  EKG is ordered today. The ekg ordered today demonstrates  ventricular paced rhythm with underlying atrial fibrillation.   Recent Labs: No results found for requested labs within last 8760 hours.    Lipid Panel    Component Value Date/Time   CHOL 122 07/23/2015 1254   TRIG 55 07/23/2015 1254   HDL 43 07/23/2015 1254   CHOLHDL 2.8 07/23/2015 1254   VLDL 11 07/23/2015 1254   LDLCALC 68 07/23/2015 1254      Wt Readings from Last 3 Encounters:  07/30/16 161 lb (73 kg)  04/09/16 156 lb 3.2 oz (70.9 kg)  09/25/15 158 lb (71.7 kg)       No flowsheet data found.    ASSESSMENT AND PLAN:  1.  Peripheral arterial disease: Ischemic left fourth toe with black discoloration and small ulceration. ABI seems to be falsely elevated and duplex showed occluded distal left popliteal artery and TP trunk. The patient is at high risk for progression of limb ischemia and poor healing. I discussed different management options and recommended proceeding with abdominal aortogram, left lower extremity angiography and possible endovascular intervention. I discussed the procedure in details as well as risks and benefits. Biggest stress is going to be the risk of stroke while he is off Xarelto. I would hold Xarelto only for 2 days and I'm going to start him on aspirin 81 mg once daily.  2. Chronic atrial fibrillation: He is currently in ventricular paced rhythm.    Disposition:   FU with me in 1 month  Signed,  Matthew Sacramento, MD  07/30/2016 10:06 AM    [place]

## 2016-08-15 ENCOUNTER — Encounter (HOSPITAL_COMMUNITY): Payer: Self-pay | Admitting: Cardiovascular Disease

## 2016-08-15 ENCOUNTER — Other Ambulatory Visit: Payer: Self-pay | Admitting: Physician Assistant

## 2016-08-15 DIAGNOSIS — I739 Peripheral vascular disease, unspecified: Secondary | ICD-10-CM

## 2016-08-15 DIAGNOSIS — I10 Essential (primary) hypertension: Secondary | ICD-10-CM | POA: Diagnosis present

## 2016-08-15 DIAGNOSIS — E1151 Type 2 diabetes mellitus with diabetic peripheral angiopathy without gangrene: Secondary | ICD-10-CM | POA: Diagnosis not present

## 2016-08-15 DIAGNOSIS — I998 Other disorder of circulatory system: Secondary | ICD-10-CM | POA: Diagnosis not present

## 2016-08-15 LAB — BASIC METABOLIC PANEL
ANION GAP: 7 (ref 5–15)
BUN: 14 mg/dL (ref 6–20)
CHLORIDE: 108 mmol/L (ref 101–111)
CO2: 24 mmol/L (ref 22–32)
Calcium: 8.1 mg/dL — ABNORMAL LOW (ref 8.9–10.3)
Creatinine, Ser: 1.41 mg/dL — ABNORMAL HIGH (ref 0.61–1.24)
GFR calc Af Amer: 49 mL/min — ABNORMAL LOW (ref >60)
GFR, EST NON AFRICAN AMERICAN: 42 mL/min — AB (ref >60)
GLUCOSE: 137 mg/dL — AB (ref 65–99)
POTASSIUM: 4.3 mmol/L (ref 3.5–5.1)
Sodium: 139 mmol/L (ref 135–145)

## 2016-08-15 LAB — GLUCOSE, CAPILLARY
GLUCOSE-CAPILLARY: 82 mg/dL (ref 65–99)
Glucose-Capillary: 116 mg/dL — ABNORMAL HIGH (ref 65–99)

## 2016-08-15 LAB — POCT ACTIVATED CLOTTING TIME
ACTIVATED CLOTTING TIME: 241 s
ACTIVATED CLOTTING TIME: 191 s
ACTIVATED CLOTTING TIME: 164 s

## 2016-08-15 MED ORDER — HYDRALAZINE HCL 20 MG/ML IJ SOLN: 10.0000 mg | INTRAMUSCULAR | Status: DC | PRN

## 2016-08-15 MED ORDER — HYDROCORTISONE 1 % EX CREA
1.0000 "application " | TOPICAL_CREAM | Freq: Three times a day (TID) | CUTANEOUS | Status: DC | PRN
  Administered 2016-08-15: 1 via TOPICAL
  Filled 2016-08-15: qty 28

## 2016-08-15 MED ORDER — CLOPIDOGREL BISULFATE 75 MG PO TABS: 75.0000 mg | ORAL_TABLET | Freq: Every day | ORAL | 11 refills | Status: DC

## 2016-08-15 MED ORDER — PANTOPRAZOLE SODIUM 40 MG PO TBEC: 40.0000 mg | DELAYED_RELEASE_TABLET | Freq: Every day | ORAL | 11 refills | Status: DC

## 2016-08-15 MED ORDER — DIPHENHYDRAMINE HCL 25 MG PO CAPS
25.0000 mg | ORAL_CAPSULE | Freq: Every evening | ORAL | Status: AC | PRN
  Administered 2016-08-15: 01:00:00 25 mg via ORAL
  Filled 2016-08-15: qty 1

## 2016-08-15 MED FILL — Verapamil HCl IV Soln 2.5 MG/ML: INTRAVENOUS | Qty: 2 | Status: AC

## 2016-08-15 NOTE — Discharge Summary (Signed)
Discharge Summary    Patient ID: [name],  MRN: [id number], DOB/AGE: 1923/11/26 83 y.o.  Admit date: 08/14/2016 Discharge date: 08/15/2016  Primary Care Provider: Foye Craig EP Cardiologist: Dr [name] [facility] Cardiologist: Dr [name]  Discharge Diagnoses    Principal Problem:   PAD (peripheral artery disease) [facility]) Active Problems:   Hypertension   Allergies No Known Allergies - Rash may be 2nd ASA, this is not clear.   Diagnostic Studies/Procedures     PV CATH with PTA : 08/14/2016  Conclusion  Successful complex endovascular intervention of an occluded left popliteal artery and TP trunk using a retrograde access via the posterior tibial artery.  Recommendations: Monitor overnight with gentle hydration due to chronic kidney disease. Resume Xarelto tomorrow if no bleeding complications. Treat with aspirin and Plavix for now and once he is on Xarelto, I recommend discontinuing aspirin.    _____________   History of Present Illness     83 y.o. male w/ hx chronic afib on Xarelto, CVA, bradycardia s/p MDT PPM 2013, DM, seen by Dr [name] for An ischemic left toe>>PV cath 03/21 as outpt, came back for PTA/stent on 03/28  Hospital Course     Consultants: none   [name] came in on 03/28 for the procedure, results above. He tolerated it well. Post-procedure, his groin was without ecchymosis or hematoma.   He had been having problems with a truncal rash for a couple of weeks. The only new medication on his list was ASA 81 mg, started 03/14 by Dr [name]. Pt had an ER visit 03/26 for the rash, sx improved w/ Benadryl and prednisone, but pt has continued to itch. His ASA was to be d/c'd anyway because he will be on Plavix and Xarelto.  Pt to follow up with Dr [name], Dr [name] to decide if pt should be listed as having allergy to ASA.   On 03/29, pt seen by Dr [name] and all data were reviewed. His cath sites were without ecchymosis or hematoma. He was still having  some LLE edema, but it was improved. He lives alone but has good family support. He was on omeprazole, but because he is on Plavix, the omeprazole was changed to Protonix.  His BP was elevated, will let Dr [name] manage it. No further inpatient workup was indicated and he is considered stable for discharge, to follow up as an outpatient.  _____________  Discharge Vitals Blood pressure (!) 163/82, pulse 73, temperature 97.8 F (36.6 C), temperature source Oral, resp. rate 17, weight 154 lb 5.2 oz (70 kg), SpO2 96 %.  Filed Weights   08/15/16 0404  Weight: 154 lb 5.2 oz (70 kg)    Labs & Radiologic Studies    CBC Lab Results  Component Value Date   WBC 6.7 08/12/2016   HGB 10.2 (L) 08/12/2016   HCT 31.3 (L) 08/12/2016   MCV 78.3 08/12/2016   PLT 131 (L) 38/88/7579    Basic Metabolic Panel  Recent Labs  08/15/16 0353  NA 139  K 4.3  CL 108  CO2 24  GLUCOSE 137*  BUN 14  CREATININE 1.41*  CALCIUM 8.1*  _____________   Disposition   Pt is being discharged home today in good condition.  Follow-up Plans & Appointments    Follow-up Information    Matthew Sacramento, MD Follow up on 08/27/2016.   Specialty:  Cardiology Why:  The office will call, you need Dopplers (ultrasound) during that appt, it may be before or  after that.  Contact information: [address] [place] [address] [phone]          Discharge Instructions    Call MD for:  redness, tenderness, or signs of infection (pain, swelling, redness, odor or green/yellow discharge around incision site)    Complete by:  As directed    Diet - low sodium heart healthy    Complete by:  As directed    Increase activity slowly    Complete by:  As directed       Discharge Medications   Current Discharge Medication List    START taking these medications   Details  clopidogrel (PLAVIX) 75 MG tablet Take 1 tablet (75 mg total) by mouth daily with breakfast. Qty: 30 tablet, Refills: 11      pantoprazole (PROTONIX) 40 MG tablet Take 1 tablet (40 mg total) by mouth daily. Qty: 30 tablet, Refills: 11      CONTINUE these medications which have NOT CHANGED   Details  bisacodyl (BISACODYL) 5 MG EC tablet Take 5 mg by mouth daily as needed for moderate constipation.    latanoprost (XALATAN) 0.005 % ophthalmic solution Place 1 drop into the left eye at bedtime.     losartan-hydrochlorothiazide (HYZAAR) 100-12.5 MG tablet Take 1 tablet by mouth every evening.    metoprolol succinate (TOPROL-XL) 50 MG 24 hr tablet Take 50 mg by mouth every evening.    OVER THE COUNTER MEDICATION Take 1 tablet by mouth daily. "appetitie boost" for supplement    predniSONE (DELTASONE) 10 MG tablet Take 2 tablets (20 mg total) by mouth daily. Qty: 10 tablet, Refills: 0    rivaroxaban (XARELTO) 15 MG TABS tablet Take 1 tablet (15 mg total) by mouth daily with supper. Qty: 30 tablet, Refills: 3    polycarbophil (FIBERCON) 625 MG tablet Take 625 mg by mouth daily as needed for moderate constipation.      STOP taking these medications     aspirin EC 81 MG tablet      omeprazole (PRILOSEC) 20 MG capsule           Outstanding Labs/Studies   None  Duration of Discharge Encounter   Greater than 30 minutes including physician time.  Matthew Speak NP 08/15/2016, 11:43 AM

## 2016-08-15 NOTE — Care Management Note (Signed)
Case Management Note  Patient Details  Name: JAXXEN VOONG MRN: [id number] Date of Birth: Jul 31, 1923  Subjective/Objective:  From home alone, he states his grand daughter and niece lives nearby and they check on him all  The time.  His PCP is Dr. Carlis Abbott, he was already taking xarelto at home , will be on plavix and asa per MD note.  He is for possible dc today.                     Action/Plan:   Expected Discharge Date:                  Expected Discharge Plan:  Home/Self Care  In-House Referral:     Discharge planning Services  CM Consult  Post Acute Care Choice:    Choice offered to:     DME Arranged:    DME Agency:     HH Arranged:    HH Agency:     Status of Service:  Completed, signed off  If discussed at [employer] of Stay Meetings, dates discussed:    Additional Comments:  Zenon Mayo, RN 08/15/2016, 9:31 AM

## 2016-08-15 NOTE — Discharge Instructions (Signed)
PLEASE REMEMBER TO BRING ALL OF YOUR MEDICATIONS TO EACH OF YOUR FOLLOW-UP OFFICE VISITS.  PLEASE ATTEND ALL SCHEDULED FOLLOW-UP APPOINTMENTS.   Activity: Increase activity slowly as tolerated. You may shower, but no soaking baths (or swimming) for 1 week. No driving for 2 days. No lifting over 5 lbs for 1 week. No sexual activity for 1 week.   You May Return to Work: in 1 week (if applicable)  Wound Care: You may wash cath site gently with soap and water. Keep cath site clean and dry. If you notice pain, swelling, bleeding or pus at your cath site, please call [phone].    Peripheral Vascular Catheterization Site Care Refer to this sheet in the next few weeks. These instructions provide you with information on caring for yourself after your procedure. Your caregiver may also give you more specific instructions. Your treatment has been planned according to current medical practices, but problems sometimes occur. Call your caregiver if you have any problems or questions after your procedure. HOME CARE INSTRUCTIONS  You may shower 24 hours after the procedure. Remove the bandage (dressing) when you get home and gently wash the site with plain soap and water. Gently pat the site dry.   Do not apply powder or lotion to the site.   Do not sit in a bathtub, swimming pool, or whirlpool for 5 to 7 days.   No bending, squatting, or lifting anything over 10 pounds (4.5 kg) as directed by your caregiver.   Inspect the site at least twice daily.   Do not drive home if you are discharged the same day of the procedure. Have someone else drive you.   You may drive 24 hours after the procedure unless otherwise instructed by your caregiver.  What to expect:  Any bruising will usually fade within 1 to 2 weeks.   Blood that collects in the tissue (hematoma) may be painful to the touch. It should usually decrease in size and tenderness within 1 to 2 weeks.  SEEK IMMEDIATE MEDICAL CARE IF:  You  have unusual pain at the site or down the affected limb.   You have redness, warmth, swelling, or pain at the site.   You have drainage (other than a small amount of blood on the dressing).   You have chills.   You have a fever or persistent symptoms for more than 72 hours.   You have a fever and your symptoms suddenly get worse.   Your leg becomes pale, cool, tingly, or numb.   You have heavy bleeding from the site. Hold pressure on the site.  Document Released: 06/08/2010 Document Revised: 04/25/2011 Document Reviewed: 06/08/2010 [facility] Patient Information 2012 [place].

## 2016-08-15 NOTE — Progress Notes (Signed)
Progress Note  Patient Name: [name] Date of Encounter: 08/15/2016  Primary Cardiologist: Dr Fletcher Anon >>PV  Patient Profile     83 y.o. male w/ hx chronic afib on Xarelto, CVA, bradycardia s/p PPM, DM, seen by Dr Fletcher Anon for claudication>>PV cath 03/21 as outpt on 03/28  Subjective   Still with problems from itching, mostly on his trunk and neck, some on extremities. Started a few weeks ago, ER visit 03/26 for this. Otherwise doing well. Lives alone, but has niece and granddaughter nearby  Inpatient Medications    Scheduled Meds: . aspirin EC  81 mg Oral QPM  . clopidogrel  75 mg Oral Q breakfast  . feeding supplement  1 Container Oral TID BM  . hydrochlorothiazide  12.5 mg Oral Daily  . latanoprost  1 drop Left Eye QHS  . losartan  100 mg Oral Daily  . metoprolol succinate  50 mg Oral QPM  . sodium chloride flush  3 mL Intravenous Q12H   Continuous Infusions:  PRN Meds: sodium chloride, acetaminophen, bisacodyl, hydrALAZINE, hydrocortisone cream, ondansetron (ZOFRAN) IV, polycarbophil, sodium chloride flush   Vital Signs    Vitals:   08/15/16 0015 08/15/16 0030 08/15/16 0100 08/15/16 0404  BP: (!) 144/69 134/63 (!) 150/63 (!) 135/52  Pulse: 67 62  64  Resp: 13 16 19 15   Temp:    97.8 F (36.6 C)  TempSrc:    Oral  SpO2: 100% 100%  100%  Weight:    154 lb 5.2 oz (70 kg)    Intake/Output Summary (Last 24 hours) at 08/15/16 0819 Last data filed at 08/15/16 4627  Gross per 24 hour  Intake          1150.98 ml  Output             1550 ml  Net          -399.02 ml   Filed Weights   08/15/16 0404  Weight: 154 lb 5.2 oz (70 kg)    Telemetry    afib +/- V pacing - Personally Reviewed  ECG    n/a - Personally Reviewed  Physical Exam   General: Well developed, well nourished, male appearing in no acute distress. Head: Normocephalic, atraumatic.  Neck: Supple without bruits, JVD not elevated. Lungs:  Resp regular and unlabored, CTA. Heart: Irreg R&R,  S1, S2, no S3, S4, or murmur; no rub. Abdomen: Soft, non-tender, non-distended with normoactive bowel sounds. No hepatomegaly. No rebound/guarding. No obvious abdominal masses. Extremities: No clubbing, cyanosis, no edema. Distal radial pulses are 2+ bilaterally. R DP  Weak but palpable, L DP and PT palpable; R groin cath site without ecchymosis or hematoma, + bruit Neuro: Alert and oriented X 3. Moves all extremities spontaneously. Psych: Normal affect.  Labs    Hematology  Recent Labs Lab 08/12/16 1028  WBC 6.7  RBC 4.00*  HGB 10.2*  HCT 31.3*  MCV 78.3  MCH 25.5*  MCHC 32.6  RDW 16.2*  PLT 131*    Chemistry  Recent Labs Lab 08/12/16 1028 08/15/16 0353  NA 141 139  K 4.0 4.3  CL 109 108  CO2 24 24  GLUCOSE 111* 137*  BUN 15 14  CREATININE 1.54* 1.41*  CALCIUM 8.2* 8.1*  PROT 6.3*  --   ALBUMIN 3.6  --   AST 21  --   ALT 10*  --   ALKPHOS 65  --   BILITOT 1.1  --   GFRNONAA 38* 42*  GFRAA 44*  [place] 7      Radiology    No results found.  Cardiac Studies   PV CATH: 08/14/2016 Conclusion  Successful complex endovascular intervention of an occluded left popliteal artery and TP trunk using a retrograde access via the posterior tibial artery.  Recommendations: Monitor overnight with gentle hydration due to chronic kidney disease. Resume Xarelto tomorrow if no bleeding complications. Treat with aspirin and Plavix for now and once he is on Xarelto, I recommend discontinuing aspirin.      Patient Profile     83 y.o. male w/ hx chronic afib on Xarelto, CVA, bradycardia s/p PPM, DM, seen by Dr Fletcher Anon for An ischemic left toe>>PV cath as outpt on 03/28  Assessment & Plan     Principal Problem:   PAD (peripheral artery disease) ([place]) - s/p PCI of the L popliteal, TP trunk - has f/u with Dr Fletcher Anon 04/10, Dr Fletcher Anon would like Dopplers also - no problems with cath site. - pt lives alone but has family nearby, MD advise if this is ok, or if he  needs someone with him for a short time  Active Problems:   Hypertension - BP elevated in hospital this am, LVG on echo 07/2015 - pt on home rx - MD advise on adding amlodipine or leave to PCP    Rash:  -  ASA 81 mg started on 03/13, only new med. Plavix to start 03/29. - ASA to be stopped when Xarelto restarted, today.  - f/u with PCP if rash does not improve  Plan: d/c today if everything is worked out.   Augusto Garbe 8:19 AM 08/15/2016 Pager: [phone]  Patient seen, examined. Available data reviewed. Agree with findings, assessment, and plan as outlined by Rosaria Ferries, PA-C. The patient is an elderly man, alert and oriented, in no distress. Lungs are clear. Heart is regular without murmur. Right groin site is clear. Left posterior tibial access site is clear. There is 1+ edema in the left lower leg. There is no hematoma present. The posterior tibial pulse is 2+. The patient appears stable for hospital discharge. Resume Xarelto today. Stop ASA - unclear whether he is allergic with rash as the reaction. With Xarelto and plavix, best to be off of ASA anyway (see Dr Tyrell Antonio note). FU Dr Fletcher Anon as planned.  Sherren Mocha, M.D. 08/15/2016 9:48 AM

## 2016-08-26 ENCOUNTER — Other Ambulatory Visit: Payer: Self-pay | Admitting: Physician Assistant

## 2016-08-26 DIAGNOSIS — I739 Peripheral vascular disease, unspecified: Secondary | ICD-10-CM

## 2016-08-27 ENCOUNTER — Ambulatory Visit: Payer: Medicare Other | Admitting: Physician Assistant

## 2016-08-27 ENCOUNTER — Encounter: Payer: Self-pay | Admitting: Cardiovascular Disease

## 2016-08-27 ENCOUNTER — Ambulatory Visit (INDEPENDENT_AMBULATORY_CARE_PROVIDER_SITE_OTHER): Payer: Medicare Other | Admitting: Cardiovascular Disease

## 2016-08-27 ENCOUNTER — Ambulatory Visit (HOSPITAL_COMMUNITY)
Admit: 2016-08-27 | Discharge: 2016-08-27 | Disposition: A | Discharge status: Home / Self Care | Payer: Medicare Other | Attending: Cardiovascular Disease | Admitting: Cardiovascular Disease

## 2016-08-27 VITALS — BP 142/80 | HR 68 | Ht 71.0 in | Wt 149.5 lb

## 2016-08-27 DIAGNOSIS — Z9889 Other specified postprocedural states: Secondary | ICD-10-CM | POA: Insufficient documentation

## 2016-08-27 DIAGNOSIS — L97929 Non-pressure chronic ulcer of unspecified part of left lower leg with unspecified severity: Secondary | ICD-10-CM | POA: Diagnosis not present

## 2016-08-27 DIAGNOSIS — Z8673 Personal history of transient ischemic attack (TIA), and cerebral infarction without residual deficits: Secondary | ICD-10-CM | POA: Diagnosis not present

## 2016-08-27 DIAGNOSIS — I482 Chronic atrial fibrillation, unspecified: Secondary | ICD-10-CM

## 2016-08-27 DIAGNOSIS — I739 Peripheral vascular disease, unspecified: Secondary | ICD-10-CM | POA: Diagnosis not present

## 2016-08-27 DIAGNOSIS — I2510 Atherosclerotic heart disease of native coronary artery without angina pectoris: Secondary | ICD-10-CM | POA: Diagnosis not present

## 2016-08-27 DIAGNOSIS — Z95820 Peripheral vascular angioplasty status with implants and grafts: Secondary | ICD-10-CM | POA: Diagnosis present

## 2016-08-27 DIAGNOSIS — I10 Essential (primary) hypertension: Secondary | ICD-10-CM | POA: Insufficient documentation

## 2016-08-27 DIAGNOSIS — I4891 Unspecified atrial fibrillation: Secondary | ICD-10-CM | POA: Insufficient documentation

## 2016-08-27 DIAGNOSIS — E119 Type 2 diabetes mellitus without complications: Secondary | ICD-10-CM | POA: Diagnosis not present

## 2016-08-27 MED ORDER — TICAGRELOR 60 MG PO TABS: 60.0000 mg | ORAL_TABLET | Freq: Two times a day (BID) | ORAL | 3 refills | Status: DC

## 2016-08-27 NOTE — Patient Instructions (Addendum)
Medication Instructions:  Your physician has recommended you make the following change in your medication:  1. STOP Plavix (clopidogrel) 2. START Brilinta 60mg  take one tablet by mouth twice a day (4 week supply of samples given to the patient)  Labwork: No new orders.   Testing/Procedures: No new orders.   Follow-Up: Your physician recommends that you schedule a follow-up appointment in: 1 MONTH with Dr Fletcher Anon   Any Other Special Instructions Will Be Listed Below (If Applicable).     If you need a refill on your cardiac medications before your next appointment, please call your pharmacy.

## 2016-08-27 NOTE — Progress Notes (Signed)
Cardiology Office Note   Date:  08/27/2016   ID:  [name], DOB [birth year], MRN [id number]  PCP:  Matthew Spurling, MD  Cardiologist:  Dr. Lovena Craig Referring physician: Dr. Blima Craig.  Chief Complaint  Patient presents with  . PAD    pt c/o rash and itching on back, shoulders and chest for the past 2 weeks      History of Present Illness: [name] is a 83 y.o. male who is here today for a follow-up visit regarding peripheral arterial disease.The patient has known history of chronic atrial fibrillation on long-term anticoagulation with Xarelto, symptomatic bradycardia status post permanent pacemaker placement, previous stroke one day after running out of Xarelto and diet-controlled diabetes. He was seen recently for ulceration on the left distal fourth toe with painful dark discoloration. There was a callus in that area with plans for debridement but the patient was referred for vascular evaluation before doing that. He underwent vascular studies in our office which showed normal ABI on the right side and mildly reduced on the left side at 0.93. Duplex showed occlusion of the distal left popliteal artery and tibial peroneal trunk. I proceeded with angiography last month which showed no significant aortoiliac disease: Occluded left popliteal artery with reconstitution in the distal to the trunk with patent circumflex tibial and peroneal arteries. Attempted antegrade angioplasty of the left popliteal artery was not successful due to inability to cross the occlusion intraluminally. The patient was brought back a week later and underwent successful complex endovascular intervention of the occluded left popliteal artery and TP trunk using a retrograde access via the left posterior tibial artery. He is doing well overall with significant improvement in the ischemic left fourth toe with almost return to normal color. No claudication. His biggest complaint is extensive itching  since the most recent procedure. He was started on Plavix at that time. No chest pain or shortness of breath.    Past Medical History:  Diagnosis Date  . Arthritis   . Atrial fibrillation ([place])   . CKD (chronic kidney disease)   . History of hiatal hernia   . Hx SBO    Last 2013 all treated conservatively  . Hypertension   . Presence of permanent cardiac pacemaker   . Prostate cancer ([place])   . Stroke [facility]) 2017   left facial drooping noted 08/14/2016  . Tachycardia-bradycardia syndrome ([place])   . Type II diabetes mellitus ([place])   . Ventral hernia    x3    Past Surgical History:  Procedure Laterality Date  . ABDOMINAL AORTOGRAM W/LOWER EXTREMITY N/A 08/07/2016   Procedure: Abdominal Aortogram w/Lower Extremity;  Surgeon: Wellington Hampshire, MD;  Location: [place] [facility];  Service: Cardiovascular;  Laterality: N/A;  . CHOLECYSTECTOMY OPEN  03/31/2001   Dr Lindon Romp  . COLON SURGERY    . EXPLORATORY LAPAROTOMY  08/2004   Archie Endo 10/01/2010  . HEMORRHOID SURGERY    . HERNIA REPAIR    . HIATAL HERNIA REPAIR  2002  . INCISIONAL HERNIA REPAIR  08/2004   Archie Endo 10/01/2010  . INSERT / REPLACE / REMOVE PACEMAKER    . PERIPHERAL VASCULAR BALLOON ANGIOPLASTY  08/07/2016   Procedure: Peripheral Vascular Balloon Angioplasty;  Surgeon: Wellington Hampshire, MD;  Location: [place] [facility];  Service: Cardiovascular;;  left popliteal artery  . PERIPHERAL VASCULAR INTERVENTION  08/14/2016   popliteal artery/notes 08/14/2016  . PERIPHERAL VASCULAR INTERVENTION Left 08/14/2016   Procedure: Peripheral Vascular Intervention;  Surgeon:  Wellington Hampshire, MD;  Location: [place] [facility];  Service: Cardiovascular;  Laterality: Left;  popliteal artery  . PERMANENT PACEMAKER GENERATOR CHANGE N/A 04/06/2012   Procedure: PERMANENT PACEMAKER GENERATOR CHANGE;  Surgeon: Evans Lance, MD;  Location: [facility] CATH LAB;  Service: Cardiovascular;  Laterality: N/A;  . SMALL INTESTINE SURGERY  09/16/2004   SBO resection,  VH repair     Current Outpatient Prescriptions  Medication Sig Dispense Refill  . bisacodyl (BISACODYL) 5 MG EC tablet Take 5 mg by mouth daily as needed for moderate constipation.    . clopidogrel (PLAVIX) 75 MG tablet Take 1 tablet (75 mg total) by mouth daily with breakfast. 30 tablet 11  . latanoprost (XALATAN) 0.005 % ophthalmic solution Place 1 drop into the left eye at bedtime.     Marland Kitchen losartan-hydrochlorothiazide (HYZAAR) 100-12.5 MG tablet Take 1 tablet by mouth every evening.    . metoprolol succinate (TOPROL-XL) 50 MG 24 hr tablet Take 50 mg by mouth every evening.    Marland Kitchen OVER THE COUNTER MEDICATION Take 1 tablet by mouth daily. "appetitie boost" for supplement    . pantoprazole (PROTONIX) 40 MG tablet Take 1 tablet (40 mg total) by mouth daily. 30 tablet 11  . polycarbophil (FIBERCON) 625 MG tablet Take 625 mg by mouth daily as needed for moderate constipation.    . predniSONE (DELTASONE) 10 MG tablet Take 2 tablets (20 mg total) by mouth daily. 10 tablet 0  . rivaroxaban (XARELTO) 15 MG TABS tablet Take 1 tablet (15 mg total) by mouth daily with supper. 30 tablet 3   No current facility-administered medications for this visit.     Allergies:   Patient has no known allergies.    Social History:  The patient  reports that he quit smoking about 61 years ago. He has a 22.50 pack-year smoking history. He quit smokeless tobacco use about 64 years ago. His smokeless tobacco use included Chew. He reports that he does not drink alcohol or use drugs.   Family History:  The patient's family history includes Cancer in his brother; Pneumonia in his father; Stroke in his brother and sister.    ROS:  Please see the history of present illness.   Otherwise, review of systems are positive for none.   All other systems are reviewed and negative.    PHYSICAL EXAM: VS:  BP (!) 142/80   Pulse 68   Ht 5\' 11"  (1.803 m)   Wt 149 lb 8 oz (67.8 kg)   BMI 20.85 kg/m  , BMI Body mass index is 20.85  kg/m. GEN: Well nourished, well developed, in no acute distress  HEENT: normal  Neck: no JVD, carotid bruits, or masses Cardiac: RRR; no murmurs, rubs, or gallops,no edema  Respiratory:  clear to auscultation bilaterally, normal work of breathing GI: soft, nontender, nondistended, + BS MS: no deformity or atrophy  Skin: warm and dry, no rash Neuro:  Strength and sensation are intact Psych: euthymic mood, full affect Vascular: Radial pulse is +1 bilaterally. Femoral pulses: +2 bilaterally.  Posterior tibial pulse is very strong on the left side. Significant improvement in.discoloration of the left fourth toe with a small callus at the tip.  EKG:  EKG is not ordered today.    Recent Labs: 08/12/2016: ALT 10; Hemoglobin 10.2; Platelets 131 08/15/2016: BUN 14; Creatinine, Ser 1.41; Potassium 4.3; Sodium 139    Lipid Panel    Component Value Date/Time   CHOL 122 07/23/2015 1254   TRIG 55  07/23/2015 1254   HDL 43 07/23/2015 1254   CHOLHDL 2.8 07/23/2015 1254   VLDL 11 07/23/2015 1254   LDLCALC 68 07/23/2015 1254      Wt Readings from Last 3 Encounters:  08/27/16 149 lb 8 oz (67.8 kg)  08/15/16 154 lb 5.2 oz (70 kg)  08/07/16 155 lb (70.3 kg)       No flowsheet data found.    ASSESSMENT AND PLAN:  1.  Peripheral arterial disease: Ischemic left fourth toe with black discoloration and small ulceration. Status post successful complex endovascular intervention of the occluded left popliteal artery and TP trunk with placement of 2 stents. One of them was a drug-eluting stent. Posterior tibial pulses normal on the left side. Vascular studies today showed improvement in ABI to 1.2 and duplex showed patent left popliteal, TP trunk and normal flow in the distal vessels.  2. Chronic atrial fibrillation: His rate is controlled and he is on long-term anticoagulation with Xarelto.  3. Itching likely a side effect of Plavix. This was discontinued today and I switched him to Brilinta 60  mg twice daily. I elected to use a lower dose given that he is on anticoagulation with Xarelto and at higher risk for bleeding. He does need to be on an antiplatelet medication for a year given placement of a drug-eluting stent placement to the left TP trunk.    Disposition:   FU with me in 1 month  Signed,  Kathlyn Sacramento, MD  08/27/2016 9:43 AM    [place]

## 2016-08-28 ENCOUNTER — Telehealth: Payer: Self-pay | Admitting: Cardiovascular Disease

## 2016-08-28 NOTE — Telephone Encounter (Signed)
Lm2cb 

## 2016-08-28 NOTE — Telephone Encounter (Signed)
Pt c/o medication issue:  1. Name of Medication: plavix  2. How are you currently taking this medication (dosage and times per day)? 1xday  3. Are you having a reaction (difficulty breathing--STAT)?no  4. What is your medication issue? Itching all over uses Vaper rub

## 2016-08-28 NOTE — Telephone Encounter (Signed)
Sam ples taken to the front desk, grandaughter-[name] will pick up today

## 2016-08-28 NOTE — Telephone Encounter (Signed)
Stop using vapor rub - it won't help with the itching.  Per Dr. Tyrell Antonio office note yesterday he switched her to Brilinta 60 mg bid.  Will take about 4-5 days for itching to go away if it was caused by clopidogrel.  Use topical benadryl for itching.

## 2016-08-28 NOTE — Telephone Encounter (Signed)
Spoke with grandaughter she states pt started taking plavix and states that pt is "itching all over" and is unable to get his shoes on because of the swelling. She states that pt has been putting vapor rub on to "help with the itching" she states that he has no other symptoms, SOB, chest pain or pressure, nausea or vomitting,   Lightheadedness or dizziness. Izell Ramah will get some benadryl and see if that helps.  We got disconnected, Lm x3 to call back

## 2016-09-03 ENCOUNTER — Telehealth: Payer: Self-pay | Admitting: Cardiovascular Disease

## 2016-09-03 NOTE — Telephone Encounter (Signed)
Returned call to granddaughter-wondering if prilosec was suppose to be discontinued. She saw it on the discharge paperwork and wanted to clarify-advised per hospital d/c, patient was to discontinue prilosec and start protonix.  Granddaughter verbalized understanding.  Also states she was instructed to get him OTC "appetite booster" by cardiology and can not find this anywhere OTC, states she looked online as well.  Wondering if this is something we can prescribe?  Advised I would clarify with MD.  Verbalized understanding.

## 2016-09-03 NOTE — Telephone Encounter (Signed)
Ok to start protonix. I do not know of or prescribe appetite boosters. GT

## 2016-09-03 NOTE — Telephone Encounter (Signed)
Please call,question about pt's medication.

## 2016-09-09 ENCOUNTER — Telehealth: Payer: Self-pay | Admitting: Cardiology

## 2016-09-09 ENCOUNTER — Ambulatory Visit (INDEPENDENT_AMBULATORY_CARE_PROVIDER_SITE_OTHER): Payer: Medicare Other | Admitting: *Deleted

## 2016-09-09 DIAGNOSIS — I495 Sick sinus syndrome: Secondary | ICD-10-CM

## 2016-09-09 NOTE — Telephone Encounter (Signed)
Spoke with pt and reminded pt of remote transmission that is due today. Pt verbalized understanding.   

## 2016-09-10 NOTE — Progress Notes (Signed)
Remote pacemaker transmission.   

## 2016-09-12 ENCOUNTER — Encounter: Payer: Self-pay | Admitting: Cardiology

## 2016-09-12 LAB — CUP PACEART REMOTE DEVICE CHECK
Battery Remaining Longevity: 46 mo
Battery Voltage: 2.77 V
Date Time Interrogation Session: 20180423210306
Implantable Lead Location: 753860
Implantable Lead Model: 5076
Implantable Pulse Generator Implant Date: 20131118
Lead Channel Setting Pacing Amplitude: 2.5 V
Lead Channel Setting Sensing Sensitivity: 5.6 mV
MDC IDC LEAD IMPLANT DT: 20060510
MDC IDC MSMT BATTERY IMPEDANCE: 1422 Ohm
MDC IDC MSMT LEADCHNL RA IMPEDANCE VALUE: 0 Ohm
MDC IDC MSMT LEADCHNL RV IMPEDANCE VALUE: 478 Ohm
MDC IDC MSMT LEADCHNL RV PACING THRESHOLD AMPLITUDE: 1 V
MDC IDC MSMT LEADCHNL RV PACING THRESHOLD PULSEWIDTH: 0.4 ms
MDC IDC SET LEADCHNL RV PACING PULSEWIDTH: 0.4 ms
MDC IDC STAT BRADY RV PERCENT PACED: 74 %

## 2016-09-12 NOTE — Telephone Encounter (Signed)
Called and spoke to granddaughter-aware of recommendations.  States patient needs something for his appetite as he has lost 30 lbs.  Advised to contact PCP for further guidance in regards to appetite.   Verbalized understanding.  Advised to call with further questions or concerns.

## 2016-09-16 ENCOUNTER — Telehealth: Payer: Self-pay | Admitting: Cardiovascular Disease

## 2016-09-16 NOTE — Telephone Encounter (Signed)
Spoke with Grand-daughter

## 2016-09-16 NOTE — Telephone Encounter (Signed)
Please call,question about his medicine.Is pt still on Plavix and Prilosec?

## 2016-09-23 NOTE — Progress Notes (Deleted)
Cardiology Office Note   Date:  09/23/2016   ID:  [name], DOB Jan 06, 1924, MRN [id number]  PCP:  Foye Spurling, MD  Cardiologist:  Dr. Lovena Le Referring physician: Dr. Blima Dessert.  No chief complaint on file.     History of Present Illness: [name] is a 83 y.o. male who is here today for a follow-up visit regarding peripheral arterial disease.The patient has known history of chronic atrial fibrillation on long-term anticoagulation with Xarelto, symptomatic bradycardia status post permanent pacemaker placement, previous stroke one day after running out of Xarelto and diet-controlled diabetes. He was seen recently for ulceration on the left distal fourth toe with painful dark discoloration. There was a callus in that area with plans for debridement but the patient was referred for vascular evaluation before doing that. He underwent vascular studies in our office which showed normal ABI on the right side and mildly reduced on the left side at 0.93. Duplex showed occlusion of the distal left popliteal artery and tibial peroneal trunk. I proceeded with angiography last month which showed no significant aortoiliac disease: Occluded left popliteal artery with reconstitution in the distal to the trunk with patent circumflex tibial and peroneal arteries. Attempted antegrade angioplasty of the left popliteal artery was not successful due to inability to cross the occlusion intraluminally. The patient was brought back a week later and underwent successful complex endovascular intervention of the occluded left popliteal artery and TP trunk using a retrograde access via the left posterior tibial artery. He is doing well overall with significant improvement in the ischemic left fourth toe with almost return to normal color. No claudication. His biggest complaint is extensive itching since the most recent procedure. He was started on Plavix at that time. No chest pain or shortness  of breath.    Past Medical History:  Diagnosis Date  . Arthritis   . Atrial fibrillation ([place])   . CKD (chronic kidney disease)   . History of hiatal hernia   . Hx SBO    Last 2013 all treated conservatively  . Hypertension   . Presence of permanent cardiac pacemaker   . Prostate cancer ([place])   . Stroke [facility]) 2017   left facial drooping noted 08/14/2016  . Tachycardia-bradycardia syndrome ([place])   . Type II diabetes mellitus ([place])   . Ventral hernia    x3    Past Surgical History:  Procedure Laterality Date  . ABDOMINAL AORTOGRAM W/LOWER EXTREMITY N/A 08/07/2016   Procedure: Abdominal Aortogram w/Lower Extremity;  Surgeon: Wellington Hampshire, MD;  Location: [place] [facility];  Service: Cardiovascular;  Laterality: N/A;  . CHOLECYSTECTOMY OPEN  03/31/2001   Dr Lindon Romp  . COLON SURGERY    . EXPLORATORY LAPAROTOMY  08/2004   Archie Endo 10/01/2010  . HEMORRHOID SURGERY    . HERNIA REPAIR    . HIATAL HERNIA REPAIR  2002  . INCISIONAL HERNIA REPAIR  08/2004   Archie Endo 10/01/2010  . INSERT / REPLACE / REMOVE PACEMAKER    . PERIPHERAL VASCULAR BALLOON ANGIOPLASTY  08/07/2016   Procedure: Peripheral Vascular Balloon Angioplasty;  Surgeon: Wellington Hampshire, MD;  Location: [place] [facility];  Service: Cardiovascular;;  left popliteal artery  . PERIPHERAL VASCULAR INTERVENTION  08/14/2016   popliteal artery/notes 08/14/2016  . PERIPHERAL VASCULAR INTERVENTION Left 08/14/2016   Procedure: Peripheral Vascular Intervention;  Surgeon: Wellington Hampshire, MD;  Location: [place] [facility];  Service: Cardiovascular;  Laterality: Left;  popliteal artery  . PERMANENT  PACEMAKER GENERATOR CHANGE N/A 04/06/2012   Procedure: PERMANENT PACEMAKER GENERATOR CHANGE;  Surgeon: Evans Lance, MD;  Location: [facility] C[facility] LAB;  Service: Cardiovascular;  Laterality: N/A;  . SMALL INTESTINE SURGERY  09/16/2004   SBO resection, VH repair     Current Outpatient Prescriptions  Medication Sig Dispense Refill  . bisacodyl  (BISACODYL) 5 MG EC tablet Take 5 mg by mouth daily as needed for moderate constipation.    Marland Kitchen latanoprost (XALATAN) 0.005 % ophthalmic solution Place 1 drop into the left eye at bedtime.     Marland Kitchen losartan-hydrochlorothiazide (HYZAAR) 100-12.5 MG tablet Take 1 tablet by mouth every evening.    . metoprolol succinate (TOPROL-XL) 50 MG 24 hr tablet Take 50 mg by mouth every evening.    Marland Kitchen OVER THE COUNTER MEDICATION Take 1 tablet by mouth daily. "appetitie boost" for supplement    . pantoprazole (PROTONIX) 40 MG tablet Take 1 tablet (40 mg total) by mouth daily. 30 tablet 11  . polycarbophil (FIBERCON) 625 MG tablet Take 625 mg by mouth daily as needed for moderate constipation.    . predniSONE (DELTASONE) 10 MG tablet Take 2 tablets (20 mg total) by mouth daily. 10 tablet 0  . rivaroxaban (XARELTO) 15 MG TABS tablet Take 1 tablet (15 mg total) by mouth daily with supper. 30 tablet 3  . ticagrelor (BRILINTA) 60 MG TABS tablet Take 1 tablet (60 mg total) by mouth 2 (two) times daily. 60 tablet 3   No current facility-administered medications for this visit.     Allergies:   Patient has no known allergies.    Social History:  The patient  reports that he quit smoking about 61 years ago. He has a 22.50 pack-year smoking history. He quit smokeless tobacco use about 64 years ago. His smokeless tobacco use included Chew. He reports that he does not drink alcohol or use drugs.   Family History:  The patient's family history includes Cancer in his brother; Pneumonia in his father; Stroke in his brother and sister.    ROS:  Please see the history of present illness.   Otherwise, review of systems are positive for none.   All other systems are reviewed and negative.    PHYSICAL EXAM: VS:  There were no vitals taken for this visit. , BMI There is no height or weight on file to calculate BMI. GEN: Well nourished, well developed, in no acute distress  HEENT: normal  Neck: no JVD, carotid bruits, or  masses Cardiac: RRR; no murmurs, rubs, or gallops,no edema  Respiratory:  clear to auscultation bilaterally, normal work of breathing GI: soft, nontender, nondistended, + BS MS: no deformity or atrophy  Skin: warm and dry, no rash Neuro:  Strength and sensation are intact Psych: euthymic mood, full affect Vascular: Radial pulse is +1 bilaterally. Femoral pulses: +2 bilaterally.  Posterior tibial pulse is very strong on the left side. Significant improvement in.discoloration of the left fourth toe with a small callus at the tip.  EKG:  EKG is not ordered today.    Recent Labs: 08/12/2016: ALT 10; Hemoglobin 10.2; Platelets 131 08/15/2016: BUN 14; Creatinine, Ser 1.41; Potassium 4.3; Sodium 139    Lipid Panel    Component Value Date/Time   CHOL 122 07/23/2015 1254   TRIG 55 07/23/2015 1254   HDL 43 07/23/2015 1254   CHOLHDL 2.8 07/23/2015 1254   VLDL 11 07/23/2015 1254   LDLCALC 68 07/23/2015 1254      Wt Readings from Last 3  Encounters:  08/27/16 149 lb 8 oz (67.8 kg)  08/15/16 154 lb 5.2 oz (70 kg)  08/07/16 155 lb (70.3 kg)       No flowsheet data found.    ASSESSMENT AND PLAN:  1.  Peripheral arterial disease: Ischemic left fourth toe with black discoloration and small ulceration. Status post successful complex endovascular intervention of the occluded left popliteal artery and TP trunk with placement of 2 stents. One of them was a drug-eluting stent. Posterior tibial pulses normal on the left side. Vascular studies today showed improvement in ABI to 1.2 and duplex showed patent left popliteal, TP trunk and normal flow in the distal vessels.  2. Chronic atrial fibrillation: His rate is controlled and he is on long-term anticoagulation with Xarelto.  3. Itching likely a side effect of Plavix. This was discontinued today and I switched him to Brilinta 60 mg twice daily. I elected to use a lower dose given that he is on anticoagulation with Xarelto and at higher risk for  bleeding. He does need to be on an antiplatelet medication for a year given placement of a drug-eluting stent placement to the left TP trunk.    Disposition:   FU with me in 1 month  Signed,  Kathlyn Sacramento, MD  09/23/2016 5:43 PM    [place]

## 2016-09-24 ENCOUNTER — Ambulatory Visit: Payer: Medicare Other | Admitting: Cardiovascular Disease

## 2016-09-26 ENCOUNTER — Encounter: Payer: Self-pay | Admitting: Cardiology

## 2016-10-08 ENCOUNTER — Ambulatory Visit: Payer: Medicare Other | Admitting: Adult Health

## 2016-10-09 ENCOUNTER — Encounter: Payer: Self-pay | Admitting: Adult Health

## 2016-10-28 ENCOUNTER — Ambulatory Visit (INDEPENDENT_AMBULATORY_CARE_PROVIDER_SITE_OTHER): Payer: Medicare Other | Admitting: Internal Medicine

## 2016-10-28 ENCOUNTER — Encounter: Payer: Self-pay | Admitting: Internal Medicine

## 2016-10-28 VITALS — BP 140/70 | HR 82 | Ht 71.0 in | Wt 151.0 lb

## 2016-10-28 DIAGNOSIS — R0602 Shortness of breath: Secondary | ICD-10-CM

## 2016-10-28 DIAGNOSIS — Z79899 Other long term (current) drug therapy: Secondary | ICD-10-CM | POA: Diagnosis not present

## 2016-10-28 DIAGNOSIS — R0600 Dyspnea, unspecified: Secondary | ICD-10-CM

## 2016-10-28 DIAGNOSIS — I482 Chronic atrial fibrillation, unspecified: Secondary | ICD-10-CM

## 2016-10-28 MED ORDER — FUROSEMIDE 20 MG PO TABS: 20.0000 mg | ORAL_TABLET | Freq: Every day | ORAL | 1 refills | Status: DC

## 2016-10-28 NOTE — Progress Notes (Signed)
HPI [name] returns today for followup. He is a very pleasant 83 year old man with chronic atrial fibrillation and symptomatic bradycardia, status post permanent pacemaker insertion.  He has had a stoke when he came off of his xarelto. He has made a nice recovery. He denies syncope, chest pain, or palpitations. No peripheral edema. His main complaint today is that he has had a loss of apetite over the past 3 months. He has lost 10 lbs. During that time he was on Brilinta which he just stopped. He c/o dyspnea as well. He denies dietary indiscretion. He is pacing over 70% of the time.  No Known Allergies   Current Outpatient Prescriptions  Medication Sig Dispense Refill  . bisacodyl (BISACODYL) 5 MG EC tablet Take 5 mg by mouth daily as needed for moderate constipation.    . cyproheptadine (PERIACTIN) 4 MG tablet Take 4 mg by mouth at bedtime.    Marland Kitchen latanoprost (XALATAN) 0.005 % ophthalmic solution Place 1 drop into the left eye at bedtime.     Marland Kitchen losartan-hydrochlorothiazide (HYZAAR) 100-12.5 MG tablet Take 1 tablet by mouth every evening.    . metoprolol succinate (TOPROL-XL) 50 MG 24 hr tablet Take 50 mg by mouth every evening.    Marland Kitchen OVER THE COUNTER MEDICATION Take 1 tablet by mouth daily. "appetitie boost" for supplement    . pantoprazole (PROTONIX) 40 MG tablet Take 1 tablet (40 mg total) by mouth daily. 30 tablet 11  . polycarbophil (FIBERCON) 625 MG tablet Take 625 mg by mouth daily as needed for moderate constipation.    . rivaroxaban (XARELTO) 15 MG TABS tablet Take 1 tablet (15 mg total) by mouth daily with supper. 30 tablet 3  . ticagrelor (BRILINTA) 60 MG TABS tablet Take 1 tablet (60 mg total) by mouth 2 (two) times daily. 60 tablet 3   No current facility-administered medications for this visit.      Past Medical History:  Diagnosis Date  . Arthritis   . Atrial fibrillation ([place])   . CKD (chronic kidney disease)   . History of hiatal hernia   . Hx SBO    Last 2013 all  treated conservatively  . Hypertension   . Presence of permanent cardiac pacemaker   . Prostate cancer ([place])   . Stroke [facility]) 2017   left facial drooping noted 08/14/2016  . Tachycardia-bradycardia syndrome ([place])   . Type II diabetes mellitus ([place])   . Ventral hernia    x3    ROS:   All systems reviewed and negative except as noted in the HPI.   Past Surgical History:  Procedure Laterality Date  . ABDOMINAL AORTOGRAM W/LOWER EXTREMITY N/A 08/07/2016   Procedure: Abdominal Aortogram w/Lower Extremity;  Surgeon: Wellington Hampshire, MD;  Location: [place] [facility] LAB;  Service: Cardiovascular;  Laterality: N/A;  . CHOLECYSTECTOMY OPEN  03/31/2001   Dr Lindon Romp  . COLON SURGERY    . EXPLORATORY LAPAROTOMY  08/2004   Archie Endo 10/01/2010  . HEMORRHOID SURGERY    . HERNIA REPAIR    . HIATAL HERNIA REPAIR  2002  . INCISIONAL HERNIA REPAIR  08/2004   Archie Endo 10/01/2010  . INSERT / REPLACE / REMOVE PACEMAKER    . PERIPHERAL VASCULAR BALLOON ANGIOPLASTY  08/07/2016   Procedure: Peripheral Vascular Balloon Angioplasty;  Surgeon: Wellington Hampshire, MD;  Location: [place] [facility];  Service: Cardiovascular;;  left popliteal artery  . PERIPHERAL VASCULAR INTERVENTION  08/14/2016   popliteal artery/notes 08/14/2016  . PERIPHERAL VASCULAR INTERVENTION Left  08/14/2016   Procedure: Peripheral Vascular Intervention;  Surgeon: Wellington Hampshire, MD;  Location: [place] [facility];  Service: Cardiovascular;  Laterality: Left;  popliteal artery  . PERMANENT PACEMAKER GENERATOR CHANGE N/A 04/06/2012   Procedure: PERMANENT PACEMAKER GENERATOR CHANGE;  Surgeon: Evans Lance, MD;  Location: [facility] CATH LAB;  Service: Cardiovascular;  Laterality: N/A;  . SMALL INTESTINE SURGERY  09/16/2004   SBO resection, VH repair     Family History  Problem Relation Age of Onset  . Pneumonia Father   . Stroke Brother   . Stroke Sister   . Cancer Brother        type unknown     Social History   Social History  . Marital  status: Widowed    Spouse name: N/A  . Number of children: N/A  . Years of education: N/A   Occupational History  . Not on file.   Social History Main Topics  . Smoking status: Former Smoker    Packs/day: 1.50    Years: 15.00    Quit date: 04/16/1955  . Smokeless tobacco: Former Systems developer    Types: Chew    Quit date: 08/17/1952  . Alcohol use No     Comment: 08/14/2016 "drank beer when I was a teenager"  . Drug use: No  . Sexual activity: Not on file   Other Topics Concern  . Not on file   Social History Narrative  . No narrative on file     BP 140/70   Pulse 82   Ht 5\' 11"  (1.803 m)   Wt 151 lb (68.5 kg)   SpO2 98%   BMI 21.06 kg/m   Physical Exam:  Well appearing elderly man,NAD HEENT: Unremarkable.  Neck:  6 cm JVD, no thyromegally Lungs:  Clear with no wheezes, rales, or rhonchi. HEART:  IRegular rate rhythm, no murmurs, no rubs, no clicks Abd:  soft, positive bowel sounds, no organomegally, no rebound, no guarding Ext:  2 plus pulses, no edema, no cyanosis, no clubbing Skin:  No rashes no nodules Neuro:  CN II through XII intact, motor grossly intact   DEVICE  Normal device function.  See PaceArt for details.   Assess/Plan: 1. Stroke - he is much improved. He will continue his xarelto. 2. HTN - his blood pressure is fairly well controlled. No change in his meds. 3. Atrial fib - his ventricular rate is well controlled. Will follow. 4. Dyspnea - unclear what the etiology is. This is a new problem. I will have him undergo a 2D echo and I have prescribed lasix. Will ask him to return for labs. 5. Anorexia - this is another new problem. It could be related to the brilinta or perhaps intestinal ischemia 6. Peripheral vascular disease - he is s/p revascularization.  Mikle Bosworth.D.

## 2016-10-28 NOTE — Patient Instructions (Addendum)
Medication Instructions:  START Lasix 20 mg daily  Labwork: BMP - in 1 week   Testing/Procedures: Your physician has requested that you have an echocardiogram. Echocardiography is a painless test that uses sound waves to create images of your heart. It provides your doctor with information about the size and shape of your heart and how well your heart's chambers and valves are working. This procedure takes approximately one hour. There are no restrictions for this procedure.     Follow-Up: Your physician recommends that you schedule a follow-up appointment in: 6 weeks with Dr. Lovena Le   Any Other Special Instructions Will Be Listed Below (If Applicable).     If you need a refill on your cardiac medications before your next appointment, please call your pharmacy.

## 2016-10-29 LAB — CUP PACEART INCLINIC DEVICE CHECK
Battery Impedance: 1587 Ohm
Battery Voltage: 2.76 V
Date Time Interrogation Session: 20180611131356
Implantable Lead Location: 753860
Lead Channel Impedance Value: 0 Ohm
Lead Channel Pacing Threshold Amplitude: 0.875 V
Lead Channel Pacing Threshold Amplitude: 1 V
Lead Channel Pacing Threshold Pulse Width: 0.4 ms
Lead Channel Sensing Intrinsic Amplitude: 22.4 mV
Lead Channel Setting Pacing Amplitude: 2.5 V
MDC IDC LEAD IMPLANT DT: 20060510
MDC IDC MSMT BATTERY REMAINING LONGEVITY: 42 mo
MDC IDC MSMT LEADCHNL RV IMPEDANCE VALUE: 454 Ohm
MDC IDC MSMT LEADCHNL RV PACING THRESHOLD PULSEWIDTH: 0.4 ms
MDC IDC PG IMPLANT DT: 20131118
MDC IDC SET LEADCHNL RV PACING PULSEWIDTH: 0.4 ms
MDC IDC SET LEADCHNL RV SENSING SENSITIVITY: 5.6 mV
MDC IDC STAT BRADY RV PERCENT PACED: 72 %

## 2016-11-01 ENCOUNTER — Telehealth: Payer: Self-pay | Admitting: Cardiovascular Disease

## 2016-11-01 NOTE — Telephone Encounter (Signed)
New message   Pt granddaughter is calling asking for a call back.   Pt c/o medication issue:  1. Name of Medication: Metoprolol succinate 50 mg, losartan hctz 100 mg  2. How are you currently taking this medication (dosage and times per day)? Once daily   3. Are you having a reaction (difficulty breathing--STAT)?   4. What is your medication issue? Pt states that the meds are making him jittery. He doesn't want to take both bp meds.

## 2016-11-01 NOTE — Telephone Encounter (Signed)
Dr Fletcher Anon is following the pt for PAD, Dr Lovena Le is managing AFib and pacemaker.   I spoke [place] and she said the pt has not been complaint with how he takes his medications.  She said the pt does not want to take both Metoprolol Succinate and Losartan HCT because he feels these medications are making him jittery.  The pt has been taking the Losartan HCT out of his pill box.  Izell Dupuyer fills the pt's pill box and she tries to give the pt some independence but he is not taking meds or he puts extra pills in the box.  Since the pt has stopped Brilinta his symptoms of SOB have improved. Per Izell Florin the pt has not started Lasix and Izell [place] would like to clarify it the pt should be taking both lasix and Losartan HCT. I advised Izell [place] that the pt needs to continue his current medications and they should check the pt's BP and pulse at home 3 times per week.  The pt could be jittery from a number of things and it is hard to make medication adjustments by phone without knowing what the BP and pulse are when the pt feels this way.  Izell [place] agreed with plan.  I will forward this message to Dr Lovena Le to clarify the lasix and Losartan HCT per [facility] request.

## 2016-11-02 ENCOUNTER — Encounter (HOSPITAL_COMMUNITY): Payer: Self-pay | Admitting: Emergency Medicine

## 2016-11-02 ENCOUNTER — Emergency Department (HOSPITAL_COMMUNITY)
Admission: EM | Admit: 2016-11-02 | Discharge: 2016-11-02 | Disposition: A | Discharge status: Home / Self Care | Payer: Medicare Other | Attending: Emergency Medicine | Admitting: Emergency Medicine

## 2016-11-02 DIAGNOSIS — E1122 Type 2 diabetes mellitus with diabetic chronic kidney disease: Secondary | ICD-10-CM | POA: Insufficient documentation

## 2016-11-02 DIAGNOSIS — Z79899 Other long term (current) drug therapy: Secondary | ICD-10-CM | POA: Diagnosis not present

## 2016-11-02 DIAGNOSIS — Z7901 Long term (current) use of anticoagulants: Secondary | ICD-10-CM | POA: Insufficient documentation

## 2016-11-02 DIAGNOSIS — I129 Hypertensive chronic kidney disease with stage 1 through stage 4 chronic kidney disease, or unspecified chronic kidney disease: Secondary | ICD-10-CM | POA: Insufficient documentation

## 2016-11-02 DIAGNOSIS — Z8546 Personal history of malignant neoplasm of prostate: Secondary | ICD-10-CM | POA: Insufficient documentation

## 2016-11-02 DIAGNOSIS — N189 Chronic kidney disease, unspecified: Secondary | ICD-10-CM | POA: Diagnosis not present

## 2016-11-02 DIAGNOSIS — Z950 Presence of cardiac pacemaker: Secondary | ICD-10-CM | POA: Diagnosis not present

## 2016-11-02 DIAGNOSIS — R2243 Localized swelling, mass and lump, lower limb, bilateral: Secondary | ICD-10-CM | POA: Diagnosis present

## 2016-11-02 DIAGNOSIS — R0602 Shortness of breath: Secondary | ICD-10-CM | POA: Diagnosis not present

## 2016-11-02 DIAGNOSIS — I509 Heart failure, unspecified: Secondary | ICD-10-CM | POA: Diagnosis not present

## 2016-11-02 DIAGNOSIS — Z8679 Personal history of other diseases of the circulatory system: Secondary | ICD-10-CM | POA: Diagnosis not present

## 2016-11-02 DIAGNOSIS — Z87891 Personal history of nicotine dependence: Secondary | ICD-10-CM | POA: Insufficient documentation

## 2016-11-02 LAB — CBC WITH DIFFERENTIAL/PLATELET
BASOS PCT: 0 %
Basophils Absolute: 0 10*3/uL (ref 0.0–0.1)
Eosinophils Absolute: 0.1 10*3/uL (ref 0.0–0.7)
Eosinophils Relative: 1 %
HEMATOCRIT: 30 % — AB (ref 39.0–52.0)
HEMOGLOBIN: 10.1 g/dL — AB (ref 13.0–17.0)
LYMPHS ABS: 1.3 10*3/uL (ref 0.7–4.0)
Lymphocytes Relative: 16 %
MCH: 25.5 pg — AB (ref 26.0–34.0)
MCHC: 33.7 g/dL (ref 30.0–36.0)
MCV: 75.8 fL — ABNORMAL LOW (ref 78.0–100.0)
MONO ABS: 0.9 10*3/uL (ref 0.1–1.0)
MONOS PCT: 12 %
Neutro Abs: 5.7 10*3/uL (ref 1.7–7.7)
Neutrophils Relative %: 71 %
Platelets: 149 10*3/uL — ABNORMAL LOW (ref 150–400)
RBC: 3.96 MIL/uL — ABNORMAL LOW (ref 4.22–5.81)
RDW: 16.8 % — AB (ref 11.5–15.5)
WBC: 8 10*3/uL (ref 4.0–10.5)

## 2016-11-02 LAB — COMPREHENSIVE METABOLIC PANEL
ALBUMIN: 3.7 g/dL (ref 3.5–5.0)
ALK PHOS: 67 U/L (ref 38–126)
ALT: 8 U/L — AB (ref 17–63)
ANION GAP: 7 (ref 5–15)
AST: 20 U/L (ref 15–41)
BUN: 19 mg/dL (ref 6–20)
CALCIUM: 8.3 mg/dL — AB (ref 8.9–10.3)
CHLORIDE: 109 mmol/L (ref 101–111)
CO2: 24 mmol/L (ref 22–32)
CREATININE: 1.48 mg/dL — AB (ref 0.61–1.24)
GFR calc Af Amer: 46 mL/min — ABNORMAL LOW (ref >60)
GFR calc non Af Amer: 40 mL/min — ABNORMAL LOW (ref >60)
GLUCOSE: 98 mg/dL (ref 65–99)
Potassium: 4.4 mmol/L (ref 3.5–5.1)
SODIUM: 140 mmol/L (ref 135–145)
Total Bilirubin: 0.6 mg/dL (ref 0.3–1.2)
Total Protein: 6.6 g/dL (ref 6.5–8.1)

## 2016-11-02 LAB — BRAIN NATRIURETIC PEPTIDE: B NATRIURETIC PEPTIDE 5: 288 pg/mL — AB (ref 0.0–100.0)

## 2016-11-02 LAB — TROPONIN I: Troponin I: <0.03 ng/mL (ref <0.03)

## 2016-11-02 MED ORDER — FUROSEMIDE 20 MG PO TABS: 20.0000 mg | ORAL_TABLET | Freq: Every day | ORAL | 0 refills | Status: DC

## 2016-11-02 NOTE — Discharge Instructions (Signed)
Begin taking Lasix as prescribed.  Follow-up with your primary Dr. this week for a follow-up appointment and echocardiogram.

## 2016-11-02 NOTE — ED Notes (Signed)
Bed: [facility]PT1 Expected date:  Expected time:  Means of arrival:  Comments: 

## 2016-11-02 NOTE — ED Triage Notes (Addendum)
Pt complaint of bilateral foot/leg swelling onset today; pt took brilinta for such but has recently taken off of that medication. Pt hx of CHF and prescribed lasix; granddaughter unsure if pt has been taking it.

## 2016-11-02 NOTE — ED Provider Notes (Signed)
[facility] DEPT Provider Note   CSN: [id number] Arrival date & time: 11/02/16  1512     History   Chief Complaint Chief Complaint  Patient presents with  . Foot Swelling    HPI [name] is a 83 y.o. male.  Patient is a 83 year old male with past medical history of peripheral artery disease, hypertension, chronic renal insufficiency, A. fib. He presents today for evaluation of bilateral foot swelling. This has been worsening over the past 2 weeks. He was seen by his doctor recently and was started on Lasix, however this prescription has not been found or filled. He denies any chest pain, but does report some shortness of breath. He denies any productive cough or chest pain. He was set up by his primary Dr. to have an echocardiogram in the very near future, however this has not been done.   The history is provided by the patient.    Past Medical History:  Diagnosis Date  . Arthritis   . Atrial fibrillation ([place])   . CKD (chronic kidney disease)   . History of hiatal hernia   . Hx SBO    Last 2013 all treated conservatively  . Hypertension   . Presence of permanent cardiac pacemaker   . Prostate cancer ([place])   . Stroke [facility]) 2017   left facial drooping noted 08/14/2016  . Tachycardia-bradycardia syndrome ([place])   . Type II diabetes mellitus ([place])   . Ventral hernia    x3    Patient Active Problem List   Diagnosis Date Noted  . Hypertension   . PAD (peripheral artery disease) ([place]) 08/14/2016  . CVA (cerebral infarction) 07/23/2015  . Anemia 07/23/2015  . Stroke (cerebrum) ([place]) 07/23/2015  . Chronic kidney disease 07/23/2015  . Acute CVA (cerebrovascular accident) ([place])   . Ventral hernia with obstruction 08/17/2013  . Hypokalemia 08/17/2013  . SBO (small bowel obstruction) ([place]) 02/14/2012  . Abdominal pain 02/14/2012  . Nausea & vomiting 02/14/2012  . PPM-Medtronic 03/13/2010  . Diabetes ([place]) 11/14/2008  . Atrial fibrillation ([place]) 11/14/2008  .  BRADYCARDIA-TACHYCARDIA SYNDROME 11/14/2008    Past Surgical History:  Procedure Laterality Date  . ABDOMINAL AORTOGRAM W/LOWER EXTREMITY N/A 08/07/2016   Procedure: Abdominal Aortogram w/Lower Extremity;  Surgeon: Wellington Hampshire, MD;  Location: [place] [facility];  Service: Cardiovascular;  Laterality: N/A;  . CHOLECYSTECTOMY OPEN  03/31/2001   Dr Lindon Romp  . COLON SURGERY    . EXPLORATORY LAPAROTOMY  08/2004   Archie Endo 10/01/2010  . HEMORRHOID SURGERY    . HERNIA REPAIR    . HIATAL HERNIA REPAIR  2002  . INCISIONAL HERNIA REPAIR  08/2004   Archie Endo 10/01/2010  . INSERT / REPLACE / REMOVE PACEMAKER    . PERIPHERAL VASCULAR BALLOON ANGIOPLASTY  08/07/2016   Procedure: Peripheral Vascular Balloon Angioplasty;  Surgeon: Wellington Hampshire, MD;  Location: [facility] CV LAB;  Service: Cardiovascular;;  left popliteal artery  . PERIPHERAL VASCULAR INTERVENTION  08/14/2016   popliteal artery/notes 08/14/2016  . PERIPHERAL VASCULAR INTERVENTION Left 08/14/2016   Procedure: Peripheral Vascular Intervention;  Surgeon: Wellington Hampshire, MD;  Location: [place] CV LAB;  Service: Cardiovascular;  Laterality: Left;  popliteal artery  . PERMANENT PACEMAKER GENERATOR CHANGE N/A 04/06/2012   Procedure: PERMANENT PACEMAKER GENERATOR CHANGE;  Surgeon: Evans Lance, MD;  Location: [facility] C[facility] LAB;  Service: Cardiovascular;  Laterality: N/A;  . SMALL INTESTINE SURGERY  09/16/2004   SBO resection, VH repair       Home  Medications    Prior to Admission medications   Medication Sig Start Date End Date Taking? Authorizing Provider  bisacodyl (BISACODYL) 5 MG EC tablet Take 5 mg by mouth daily as needed for moderate constipation.    [provider]  cyproheptadine (PERIACTIN) 4 MG tablet Take 4 mg by mouth at bedtime.    [provider]  furosemide (LASIX) 20 MG tablet Take 1 tablet (20 mg total) by mouth daily. 10/28/16 01/26/17  Evans Lance, MD  latanoprost (XALATAN) 0.005 % ophthalmic  solution Place 1 drop into the left eye at bedtime.  02/14/16   [provider]  losartan-hydrochlorothiazide (HYZAAR) 100-12.5 MG tablet Take 1 tablet by mouth every evening.    [provider]  metoprolol succinate (TOPROL-XL) 50 MG 24 hr tablet Take 50 mg by mouth every evening.    [provider]  OVER THE COUNTER MEDICATION Take 1 tablet by mouth daily. "appetitie boost" for supplement    [provider]  pantoprazole (PROTONIX) 40 MG tablet Take 1 tablet (40 mg total) by mouth daily. 08/15/16   Barrett, Evelene Croon, PA-C  polycarbophil (FIBERCON) 625 MG tablet Take 625 mg by mouth daily as needed for moderate constipation.    [provider]  rivaroxaban (XARELTO) 15 MG TABS tablet Take 1 tablet (15 mg total) by mouth daily with supper. 08/07/16   Wellington Hampshire, MD  ticagrelor (BRILINTA) 60 MG TABS tablet Take 1 tablet (60 mg total) by mouth 2 (two) times daily. 08/27/16   Wellington Hampshire, MD    Family History Family History  Problem Relation Age of Onset  . Pneumonia Father   . Stroke Brother   . Stroke Sister   . Cancer Brother        type unknown    Social History Social History  Substance Use Topics  . Smoking status: Former Smoker    Packs/day: 1.50    Years: 15.00    Quit date: 04/16/1955  . Smokeless tobacco: Former Systems developer    Types: Chew    Quit date: 08/17/1952  . Alcohol use No     Comment: 08/14/2016 "drank beer when I was a teenager"     Allergies   Patient has no known allergies.   Review of Systems Review of Systems  All other systems reviewed and are negative.    Physical Exam Updated Vital Signs BP (!) 161/97 (BP Location: Left Arm)   Pulse 78   Temp 97.8 F (36.6 C) (Oral)   Resp 20   SpO2 99%   Physical Exam  Constitutional: He is oriented to person, place, and time. He appears well-developed and well-nourished. No distress.  HENT:  Head: Normocephalic and atraumatic.  Mouth/Throat: Oropharynx is  clear and moist.  Neck: Normal range of motion. Neck supple.  Cardiovascular: Normal rate and regular rhythm.  Exam reveals no friction rub.   No murmur heard. Pulmonary/Chest: Effort normal and breath sounds normal. No respiratory distress. He has no wheezes. He has no rales.  Abdominal: Soft. Bowel sounds are normal. He exhibits no distension. There is no tenderness.  Musculoskeletal: Normal range of motion. He exhibits edema.  There is 1-2+ pitting edema of both lower extremities. DP pulses are palpable bilaterally. The feet are warm to the touch and capillary refill is intact.  Neurological: He is alert and oriented to person, place, and time. Coordination normal.  Skin: Skin is warm and dry. He is not diaphoretic.  Nursing note and vitals reviewed.  ED Treatments / Results  Labs (all labs ordered are listed, but only abnormal results are displayed) Labs Reviewed  COMPREHENSIVE METABOLIC PANEL - Abnormal; Notable for the following:       Result Value   Creatinine, Ser 1.48 (*)    Calcium 8.3 (*)    ALT 8 (*)    GFR calc non Af Amer 40 (*)    GFR calc Af Amer 46 (*)    All other components within normal limits  CBC WITH DIFFERENTIAL/PLATELET - Abnormal; Notable for the following:    RBC 3.96 (*)    Hemoglobin 10.1 (*)    HCT 30.0 (*)    MCV 75.8 (*)    MCH 25.5 (*)    RDW 16.8 (*)    Platelets 149 (*)    All other components within normal limits  BRAIN NATRIURETIC PEPTIDE - Abnormal; Notable for the following:    B Natriuretic Peptide 288.0 (*)    All other components within normal limits  TROPONIN I  URINALYSIS, ROUTINE W REFLEX MICROSCOPIC    EKG  EKG Interpretation None       Radiology No results found.  Procedures Procedures (including critical care time)  Medications Ordered in ED Medications - No data to display   Initial Impression / Assessment and Plan / ED Course  I have reviewed the triage vital signs and the nursing notes.  Pertinent labs  & imaging results that were available during my care of the patient were reviewed by me and considered in my medical decision making (see chart for details).  Patient with leg swelling and shortness of breath, likely related to CHF. He appears clinically well and there is no hypoxia. His workup is otherwise unremarkable. He does have a mildly elevated BNP. He will be prescribed Lasix and is advised to follow-up with his doctor next week and undergo his echocardiogram as scheduled.  Final Clinical Impressions(s) / ED Diagnoses   Final diagnoses:  None    New Prescriptions New Prescriptions   No medications on file     Veryl Speak, MD 11/02/16 1932

## 2016-11-08 ENCOUNTER — Other Ambulatory Visit: Payer: Self-pay

## 2016-11-08 ENCOUNTER — Ambulatory Visit (HOSPITAL_COMMUNITY): Payer: Medicare Other | Attending: Cardiovascular Disease

## 2016-11-08 ENCOUNTER — Other Ambulatory Visit: Payer: Medicare Other | Admitting: *Deleted

## 2016-11-08 DIAGNOSIS — I4891 Unspecified atrial fibrillation: Secondary | ICD-10-CM | POA: Insufficient documentation

## 2016-11-08 DIAGNOSIS — R0602 Shortness of breath: Secondary | ICD-10-CM

## 2016-11-08 DIAGNOSIS — E1122 Type 2 diabetes mellitus with diabetic chronic kidney disease: Secondary | ICD-10-CM | POA: Diagnosis not present

## 2016-11-08 DIAGNOSIS — Z87891 Personal history of nicotine dependence: Secondary | ICD-10-CM | POA: Insufficient documentation

## 2016-11-08 DIAGNOSIS — I071 Rheumatic tricuspid insufficiency: Secondary | ICD-10-CM | POA: Diagnosis not present

## 2016-11-08 DIAGNOSIS — R0600 Dyspnea, unspecified: Secondary | ICD-10-CM

## 2016-11-08 DIAGNOSIS — Z79899 Other long term (current) drug therapy: Secondary | ICD-10-CM

## 2016-11-08 DIAGNOSIS — Z8546 Personal history of malignant neoplasm of prostate: Secondary | ICD-10-CM | POA: Insufficient documentation

## 2016-11-08 DIAGNOSIS — N189 Chronic kidney disease, unspecified: Secondary | ICD-10-CM | POA: Insufficient documentation

## 2016-11-08 LAB — BASIC METABOLIC PANEL
BUN / CREAT RATIO: 12 (ref 10–24)
BUN: 19 mg/dL (ref 10–36)
CHLORIDE: 104 mmol/L (ref 96–106)
CO2: 23 mmol/L (ref 20–29)
Calcium: 8.7 mg/dL (ref 8.6–10.2)
Creatinine, Ser: 1.65 mg/dL — ABNORMAL HIGH (ref 0.76–1.27)
GFR calc non Af Amer: 36 mL/min/{1.73_m2} — ABNORMAL LOW (ref >59)
GFR, EST AFRICAN AMERICAN: 42 mL/min/{1.73_m2} — AB (ref >59)
Glucose: 124 mg/dL — ABNORMAL HIGH (ref 65–99)
POTASSIUM: 4.4 mmol/L (ref 3.5–5.2)
SODIUM: 141 mmol/L (ref 134–144)

## 2016-11-08 NOTE — Telephone Encounter (Signed)
I spoke with the patient and his family today in the office. Patient here for repeat BMP. They were concerned that the patient needed to be on a potassium supplement due to the lasix that he is taking.  He was in the ER on 11/02/16 for bilateral foot swelling.  The patient is currently on lasix- I advised the patient and his family that his potassium on 6/16 was 4.4. We are rechecking this today and will call with results- we will let them know if potassium supplement is needed based on results.  I have also advised the patient and his family that Dr. Lovena Le would like him to take his losartan and metoprolol. They are aware, but state it is just trying to get the patient to comply with this.   Will await lab results from this morning.

## 2016-11-08 NOTE — Telephone Encounter (Signed)
I would like for him to take both losartan and metoprolol and lasix. He will need a BMP in a week. Remain off of the [place]. GT

## 2016-11-12 NOTE — Telephone Encounter (Signed)
Called, spoke with pt. Reviewed recent labs and recent echo. Pt was concerned about his potassium level -  informed it was WNL at 4.4. Informed Dr. Lovena Le recommends pt to continue on his currents medications. No changes for now. Reminded of upcoming appt with Dr. Lovena Le on 12/19/16. Pt verbalized understanding and thanked me for calling.

## 2016-11-25 ENCOUNTER — Ambulatory Visit: Payer: Medicare Other | Admitting: Adult Health

## 2016-11-26 ENCOUNTER — Encounter: Payer: Self-pay | Admitting: Adult Health

## 2016-12-09 ENCOUNTER — Encounter: Payer: Medicare Other | Admitting: *Deleted

## 2016-12-11 ENCOUNTER — Telehealth: Payer: Self-pay | Admitting: Internal Medicine

## 2016-12-11 ENCOUNTER — Other Ambulatory Visit: Payer: Self-pay | Admitting: Internal Medicine

## 2016-12-11 MED ORDER — FUROSEMIDE 20 MG PO TABS: 20.0000 mg | ORAL_TABLET | Freq: Every day | ORAL | 3 refills | Status: DC

## 2016-12-11 NOTE — Telephone Encounter (Signed)
FYI  Pt's daughter calling for a refill but also mentioned asking Dr. Lovena Le about taking pacemaker out. I advised the daughter that they could address this with Dr. Lovena Le on 12/19/16, when pt comes in to see Dr. Lovena Le. Daughter verbalized understanding.

## 2016-12-13 ENCOUNTER — Encounter: Payer: Self-pay | Admitting: Cardiology

## 2016-12-19 ENCOUNTER — Encounter: Payer: Self-pay | Admitting: Internal Medicine

## 2016-12-19 ENCOUNTER — Ambulatory Visit (INDEPENDENT_AMBULATORY_CARE_PROVIDER_SITE_OTHER): Payer: Medicare Other | Admitting: Internal Medicine

## 2016-12-19 VITALS — BP 106/70 | HR 68 | Ht 71.0 in | Wt 147.8 lb

## 2016-12-19 DIAGNOSIS — I482 Chronic atrial fibrillation, unspecified: Secondary | ICD-10-CM

## 2016-12-19 DIAGNOSIS — Z950 Presence of cardiac pacemaker: Secondary | ICD-10-CM

## 2016-12-19 DIAGNOSIS — I495 Sick sinus syndrome: Secondary | ICD-10-CM | POA: Diagnosis not present

## 2016-12-19 NOTE — Progress Notes (Signed)
HPI [name] returns today for followup. He is a very pleasant 83 year old man with chronic atrial fibrillation and symptomatic bradycardia, status post permanent pacemaker insertion.  He has had a stoke when he came off of his xarelto. When I saw him last a month ago, he c/o a lack of appetite and sob. We started him on low dose lasix and checked an echo. His EF remains normal. He has improved with less sob on the lasix.   No Known Allergies   Current Outpatient Prescriptions  Medication Sig Dispense Refill  . bisacodyl (BISACODYL) 5 MG EC tablet Take 5 mg by mouth daily as needed for moderate constipation.    . cyproheptadine (PERIACTIN) 4 MG tablet Take 4 mg by mouth at bedtime.    . furosemide (LASIX) 20 MG tablet Take 1 tablet (20 mg total) by mouth daily. 90 tablet 3  . losartan-hydrochlorothiazide (HYZAAR) 100-12.5 MG tablet Take 1 tablet by mouth every evening.    . metoprolol succinate (TOPROL-XL) 50 MG 24 hr tablet Take 50 mg by mouth every evening.    Marland Kitchen OVER THE COUNTER MEDICATION Take 1 tablet by mouth daily. "appetitie boost" for supplement    . pantoprazole (PROTONIX) 40 MG tablet Take 1 tablet (40 mg total) by mouth daily. 30 tablet 11  . polycarbophil (FIBERCON) 625 MG tablet Take 625 mg by mouth daily as needed for moderate constipation.    . rivaroxaban (XARELTO) 15 MG TABS tablet Take 1 tablet (15 mg total) by mouth daily with supper. 30 tablet 3   No current facility-administered medications for this visit.      Past Medical History:  Diagnosis Date  . Arthritis   . Atrial fibrillation ([place])   . CKD (chronic kidney disease)   . History of hiatal hernia   . Hx SBO    Last 2013 all treated conservatively  . Hypertension   . Presence of permanent cardiac pacemaker   . Prostate cancer ([place])   . Stroke [facility]) 2017   left facial drooping noted 08/14/2016  . Tachycardia-bradycardia syndrome ([place])   . Type II diabetes mellitus ([place])   . Ventral hernia    x3     ROS:   All systems reviewed and negative except as noted in the HPI.   Past Surgical History:  Procedure Laterality Date  . ABDOMINAL AORTOGRAM W/LOWER EXTREMITY N/A 08/07/2016   Procedure: Abdominal Aortogram w/Lower Extremity;  Surgeon: Wellington Hampshire, MD;  Location: [place] [facility];  Service: Cardiovascular;  Laterality: N/A;  . CHOLECYSTECTOMY OPEN  03/31/2001   Dr Lindon Romp  . COLON SURGERY    . EXPLORATORY LAPAROTOMY  08/2004   Archie Endo 10/01/2010  . HEMORRHOID SURGERY    . HERNIA REPAIR    . HIATAL HERNIA REPAIR  2002  . INCISIONAL HERNIA REPAIR  08/2004   Archie Endo 10/01/2010  . INSERT / REPLACE / REMOVE PACEMAKER    . PERIPHERAL VASCULAR BALLOON ANGIOPLASTY  08/07/2016   Procedure: Peripheral Vascular Balloon Angioplasty;  Surgeon: Wellington Hampshire, MD;  Location: [place] [facility];  Service: Cardiovascular;;  left popliteal artery  . PERIPHERAL VASCULAR INTERVENTION  08/14/2016   popliteal artery/notes 08/14/2016  . PERIPHERAL VASCULAR INTERVENTION Left 08/14/2016   Procedure: Peripheral Vascular Intervention;  Surgeon: Wellington Hampshire, MD;  Location: [place] [facility];  Service: Cardiovascular;  Laterality: Left;  popliteal artery  . PERMANENT PACEMAKER GENERATOR CHANGE N/A 04/06/2012   Procedure: PERMANENT PACEMAKER GENERATOR CHANGE;  Surgeon: Carleene Overlie  Peyton Najjar, MD;  Location: [facility] CATH LAB;  Service: Cardiovascular;  Laterality: N/A;  . SMALL INTESTINE SURGERY  09/16/2004   SBO resection, VH repair     Family History  Problem Relation Age of Onset  . Pneumonia Father   . Stroke Brother   . Stroke Sister   . Cancer Brother        type unknown     Social History   Social History  . Marital status: Widowed    Spouse name: N/A  . Number of children: N/A  . Years of education: N/A   Occupational History  . Not on file.   Social History Main Topics  . Smoking status: Former Smoker    Packs/day: 1.50    Years: 15.00    Quit date: 04/16/1955  . Smokeless  tobacco: Former Systems developer    Types: Chew    Quit date: 08/17/1952  . Alcohol use No     Comment: 08/14/2016 "drank beer when I was a teenager"  . Drug use: No  . Sexual activity: Not on file   Other Topics Concern  . Not on file   Social History Narrative  . No narrative on file     BP 106/70   Pulse 68   Ht 5\' 11"  (1.803 m)   Wt 147 lb 12.8 oz (67 kg)   SpO2 98%   BMI 20.61 kg/m   Physical Exam:  Well appearing elderly man, NAD HEENT: Unremarkable Neck:  6 cm JVD, no thyromegally Lymphatics:  No adenopathy Back:  No CVA tenderness Lungs:  Clear with no wheezes HEART:  Regular rate rhythm, no murmurs, no rubs, no clicks Abd:  soft, positive bowel sounds, no organomegally, no rebound, no guarding Ext:  2 plus pulses, no edema, no cyanosis, no clubbing Skin:  No rashes no nodules Neuro:  CN II through XII intact, motor grossly intact   DEVICE  Normal device function.  See PaceArt for details.   Assess/Plan: 1. Atrial fib - he is pacing about 50% of the time and I have recommended he continue his current meds. 2. Stroke - he appears almost back to his baseline. 3. Chronic diastolic heart failure - he is class 2. I have asked him to reduce his salt intake. 4. Weight loss - this appears to be due to early satiety. I have asked him to eat more ice cream. 5. PPM - his Medtronic PPM is programmed VVI and is working normally.  [name].D.

## 2016-12-19 NOTE — Patient Instructions (Signed)
Medication Instructions:  Your physician recommends that you continue on your current medications as directed. Please refer to the Current Medication list given to you today.  Labwork: None ordered.  Testing/Procedures: None ordered.  Follow-Up: Your physician wants you to follow-up in: 6 months with Dr. Taylor.   You will receive a reminder letter in the mail two months in advance. If you don't receive a letter, please call our office to schedule the follow-up appointment.  Remote monitoring is used to monitor your Pacemaker from home. This monitoring reduces the number of office visits required to check your device to one time per year. It allows us to keep an eye on the functioning of your device to ensure it is working properly. You are scheduled for a device check from home on 03/20/2017. You may send your transmission at any time that day. If you have a wireless device, the transmission will be sent automatically. After your physician reviews your transmission, you will receive a postcard with your next transmission date.      Any Other Special Instructions Will Be Listed Below (If Applicable).     If you need a refill on your cardiac medications before your next appointment, please call your pharmacy.   

## 2017-01-11 ENCOUNTER — Encounter (HOSPITAL_COMMUNITY): Payer: Self-pay | Admitting: Emergency Medicine

## 2017-01-11 DIAGNOSIS — N183 Chronic kidney disease, stage 3 (moderate): Secondary | ICD-10-CM | POA: Diagnosis present

## 2017-01-11 DIAGNOSIS — K219 Gastro-esophageal reflux disease without esophagitis: Secondary | ICD-10-CM | POA: Diagnosis present

## 2017-01-11 DIAGNOSIS — Z79899 Other long term (current) drug therapy: Secondary | ICD-10-CM

## 2017-01-11 DIAGNOSIS — K56600 Partial intestinal obstruction, unspecified as to cause: Principal | ICD-10-CM | POA: Diagnosis present

## 2017-01-11 DIAGNOSIS — Z8546 Personal history of malignant neoplasm of prostate: Secondary | ICD-10-CM

## 2017-01-11 DIAGNOSIS — Z950 Presence of cardiac pacemaker: Secondary | ICD-10-CM

## 2017-01-11 DIAGNOSIS — I481 Persistent atrial fibrillation: Secondary | ICD-10-CM | POA: Diagnosis present

## 2017-01-11 DIAGNOSIS — Z6820 Body mass index (BMI) 20.0-20.9, adult: Secondary | ICD-10-CM

## 2017-01-11 DIAGNOSIS — Z7901 Long term (current) use of anticoagulants: Secondary | ICD-10-CM

## 2017-01-11 DIAGNOSIS — I472 Ventricular tachycardia: Secondary | ICD-10-CM | POA: Diagnosis present

## 2017-01-11 DIAGNOSIS — E1122 Type 2 diabetes mellitus with diabetic chronic kidney disease: Secondary | ICD-10-CM | POA: Diagnosis present

## 2017-01-11 DIAGNOSIS — M1990 Unspecified osteoarthritis, unspecified site: Secondary | ICD-10-CM | POA: Diagnosis present

## 2017-01-11 DIAGNOSIS — E440 Moderate protein-calorie malnutrition: Secondary | ICD-10-CM | POA: Diagnosis present

## 2017-01-11 DIAGNOSIS — K922 Gastrointestinal hemorrhage, unspecified: Secondary | ICD-10-CM | POA: Diagnosis not present

## 2017-01-11 DIAGNOSIS — K449 Diaphragmatic hernia without obstruction or gangrene: Secondary | ICD-10-CM | POA: Diagnosis present

## 2017-01-11 DIAGNOSIS — Z8673 Personal history of transient ischemic attack (TIA), and cerebral infarction without residual deficits: Secondary | ICD-10-CM

## 2017-01-11 DIAGNOSIS — I129 Hypertensive chronic kidney disease with stage 1 through stage 4 chronic kidney disease, or unspecified chronic kidney disease: Secondary | ICD-10-CM | POA: Diagnosis present

## 2017-01-11 DIAGNOSIS — R112 Nausea with vomiting, unspecified: Secondary | ICD-10-CM | POA: Diagnosis not present

## 2017-01-11 DIAGNOSIS — Z87891 Personal history of nicotine dependence: Secondary | ICD-10-CM

## 2017-01-11 LAB — CBC
HCT: 37.1 % — ABNORMAL LOW (ref 39.0–52.0)
HEMOGLOBIN: 12.6 g/dL — AB (ref 13.0–17.0)
MCH: 25.5 pg — ABNORMAL LOW (ref 26.0–34.0)
MCHC: 34 g/dL (ref 30.0–36.0)
MCV: 74.9 fL — ABNORMAL LOW (ref 78.0–100.0)
PLATELETS: 142 10*3/uL — AB (ref 150–400)
RBC: 4.95 MIL/uL (ref 4.22–5.81)
RDW: 18 % — ABNORMAL HIGH (ref 11.5–15.5)
WBC: 13 10*3/uL — ABNORMAL HIGH (ref 4.0–10.5)

## 2017-01-11 LAB — COMPREHENSIVE METABOLIC PANEL
ALBUMIN: 4.6 g/dL (ref 3.5–5.0)
ALT: 9 U/L — AB (ref 17–63)
AST: 25 U/L (ref 15–41)
Alkaline Phosphatase: 79 U/L (ref 38–126)
Anion gap: 11 (ref 5–15)
BUN: 22 mg/dL — ABNORMAL HIGH (ref 6–20)
CALCIUM: 9.3 mg/dL (ref 8.9–10.3)
CHLORIDE: 104 mmol/L (ref 101–111)
CO2: 25 mmol/L (ref 22–32)
CREATININE: 1.65 mg/dL — AB (ref 0.61–1.24)
GFR calc Af Amer: 41 mL/min — ABNORMAL LOW (ref >60)
GFR calc non Af Amer: 35 mL/min — ABNORMAL LOW (ref >60)
Glucose, Bld: 156 mg/dL — ABNORMAL HIGH (ref 65–99)
POTASSIUM: 4.6 mmol/L (ref 3.5–5.1)
Sodium: 140 mmol/L (ref 135–145)
TOTAL PROTEIN: 8.1 g/dL (ref 6.5–8.1)
Total Bilirubin: 1.1 mg/dL (ref 0.3–1.2)

## 2017-01-11 LAB — LIPASE, BLOOD: LIPASE: 32 U/L (ref 11–51)

## 2017-01-11 MED ORDER — ONDANSETRON 4 MG PO TBDP
4.0000 mg | ORAL_TABLET | Freq: Once | ORAL | Status: AC | PRN
  Administered 2017-01-11: 4 mg via ORAL
  Filled 2017-01-11: qty 1

## 2017-01-11 NOTE — ED Triage Notes (Signed)
Pt reports generalized abd pain for the past 2 days accompanied by emesis. LBM 2 days ago. Tried to take PO medication for constipation, but this made the pain worse. Pt reports a hx of SBO, feels like same.

## 2017-01-12 ENCOUNTER — Emergency Department (HOSPITAL_COMMUNITY): Payer: Medicare Other

## 2017-01-12 ENCOUNTER — Inpatient Hospital Stay (HOSPITAL_COMMUNITY)
Admission: EM | Admit: 2017-01-12 | Discharge: 2017-01-17 | DRG: 389 | Disposition: A | Discharge status: Home / Self Care | Payer: Medicare Other | Attending: Internal Medicine | Admitting: Internal Medicine

## 2017-01-12 ENCOUNTER — Inpatient Hospital Stay (HOSPITAL_COMMUNITY): Payer: Medicare Other

## 2017-01-12 DIAGNOSIS — K567 Ileus, unspecified: Secondary | ICD-10-CM | POA: Diagnosis not present

## 2017-01-12 DIAGNOSIS — K219 Gastro-esophageal reflux disease without esophagitis: Secondary | ICD-10-CM | POA: Diagnosis present

## 2017-01-12 DIAGNOSIS — Z8673 Personal history of transient ischemic attack (TIA), and cerebral infarction without residual deficits: Secondary | ICD-10-CM | POA: Diagnosis not present

## 2017-01-12 DIAGNOSIS — I481 Persistent atrial fibrillation: Secondary | ICD-10-CM | POA: Diagnosis not present

## 2017-01-12 DIAGNOSIS — R112 Nausea with vomiting, unspecified: Secondary | ICD-10-CM | POA: Diagnosis present

## 2017-01-12 DIAGNOSIS — Z79899 Other long term (current) drug therapy: Secondary | ICD-10-CM | POA: Diagnosis not present

## 2017-01-12 DIAGNOSIS — Z978 Presence of other specified devices: Secondary | ICD-10-CM | POA: Diagnosis not present

## 2017-01-12 DIAGNOSIS — Z7901 Long term (current) use of anticoagulants: Secondary | ICD-10-CM | POA: Diagnosis not present

## 2017-01-12 DIAGNOSIS — I472 Ventricular tachycardia, unspecified: Secondary | ICD-10-CM

## 2017-01-12 DIAGNOSIS — K922 Gastrointestinal hemorrhage, unspecified: Secondary | ICD-10-CM | POA: Diagnosis not present

## 2017-01-12 DIAGNOSIS — E440 Moderate protein-calorie malnutrition: Secondary | ICD-10-CM | POA: Diagnosis not present

## 2017-01-12 DIAGNOSIS — M1990 Unspecified osteoarthritis, unspecified site: Secondary | ICD-10-CM | POA: Diagnosis present

## 2017-01-12 DIAGNOSIS — E1122 Type 2 diabetes mellitus with diabetic chronic kidney disease: Secondary | ICD-10-CM | POA: Diagnosis present

## 2017-01-12 DIAGNOSIS — Z87891 Personal history of nicotine dependence: Secondary | ICD-10-CM | POA: Diagnosis not present

## 2017-01-12 DIAGNOSIS — Z8546 Personal history of malignant neoplasm of prostate: Secondary | ICD-10-CM | POA: Diagnosis not present

## 2017-01-12 DIAGNOSIS — K56609 Unspecified intestinal obstruction, unspecified as to partial versus complete obstruction: Secondary | ICD-10-CM | POA: Diagnosis not present

## 2017-01-12 DIAGNOSIS — I495 Sick sinus syndrome: Secondary | ICD-10-CM | POA: Diagnosis present

## 2017-01-12 DIAGNOSIS — K449 Diaphragmatic hernia without obstruction or gangrene: Secondary | ICD-10-CM | POA: Diagnosis present

## 2017-01-12 DIAGNOSIS — N183 Chronic kidney disease, stage 3 (moderate): Secondary | ICD-10-CM | POA: Diagnosis present

## 2017-01-12 DIAGNOSIS — I480 Paroxysmal atrial fibrillation: Secondary | ICD-10-CM | POA: Diagnosis present

## 2017-01-12 DIAGNOSIS — Z6820 Body mass index (BMI) 20.0-20.9, adult: Secondary | ICD-10-CM | POA: Diagnosis not present

## 2017-01-12 DIAGNOSIS — R109 Unspecified abdominal pain: Secondary | ICD-10-CM

## 2017-01-12 DIAGNOSIS — Z950 Presence of cardiac pacemaker: Secondary | ICD-10-CM | POA: Diagnosis not present

## 2017-01-12 DIAGNOSIS — I129 Hypertensive chronic kidney disease with stage 1 through stage 4 chronic kidney disease, or unspecified chronic kidney disease: Secondary | ICD-10-CM | POA: Diagnosis present

## 2017-01-12 DIAGNOSIS — R0609 Other forms of dyspnea: Secondary | ICD-10-CM | POA: Diagnosis present

## 2017-01-12 DIAGNOSIS — I4891 Unspecified atrial fibrillation: Secondary | ICD-10-CM | POA: Diagnosis present

## 2017-01-12 DIAGNOSIS — E1110 Type 2 diabetes mellitus with ketoacidosis without coma: Secondary | ICD-10-CM | POA: Diagnosis not present

## 2017-01-12 DIAGNOSIS — K56600 Partial intestinal obstruction, unspecified as to cause: Secondary | ICD-10-CM | POA: Diagnosis present

## 2017-01-12 LAB — URINALYSIS, ROUTINE W REFLEX MICROSCOPIC
Bilirubin Urine: NEGATIVE
GLUCOSE, UA: NEGATIVE mg/dL
HGB URINE DIPSTICK: NEGATIVE
LEUKOCYTES UA: NEGATIVE
Nitrite: NEGATIVE
PH: 6 (ref 5.0–8.0)
Protein, ur: NEGATIVE mg/dL
Specific Gravity, Urine: 1.02 (ref 1.005–1.030)

## 2017-01-12 LAB — POC OCCULT BLOOD, ED: Fecal Occult Bld: NEGATIVE

## 2017-01-12 LAB — MRSA PCR SCREENING: MRSA by PCR: NEGATIVE

## 2017-01-12 MED ORDER — ONDANSETRON HCL 4 MG/2ML IJ SOLN: 4.0000 mg | Freq: Three times a day (TID) | INTRAMUSCULAR | Status: DC | PRN

## 2017-01-12 MED ORDER — METOPROLOL TARTRATE 5 MG/5ML IV SOLN
5.0000 mg | Freq: Three times a day (TID) | INTRAVENOUS | Status: DC
  Administered 2017-01-12: 5 mg via INTRAVENOUS
  Filled 2017-01-12: qty 5

## 2017-01-12 MED ORDER — SODIUM CHLORIDE 0.9 % IV SOLN
INTRAVENOUS | Status: DC
  Administered 2017-01-12: 06:00:00 via INTRAVENOUS

## 2017-01-12 MED ORDER — IOPAMIDOL (ISOVUE-300) INJECTION 61%
100.0000 mL | Freq: Once | INTRAVENOUS | Status: AC | PRN
  Administered 2017-01-12: 80 mL via INTRAVENOUS

## 2017-01-12 MED ORDER — LIDOCAINE HCL 2 % EX GEL
1.0000 "application " | Freq: Once | CUTANEOUS | Status: AC
  Administered 2017-01-12: 1 via TOPICAL
  Filled 2017-01-12: qty 11

## 2017-01-12 MED ORDER — IOPAMIDOL (ISOVUE-300) INJECTION 61%
INTRAVENOUS | Status: AC
  Filled 2017-01-12: qty 50

## 2017-01-12 MED ORDER — BUTAMBEN-TETRACAINE-BENZOCAINE 2-2-14 % EX AERO
INHALATION_SPRAY | CUTANEOUS | Status: AC
  Filled 2017-01-12: qty 20

## 2017-01-12 MED ORDER — SODIUM CHLORIDE 0.9 % IV BOLUS (SEPSIS)
1000.0000 mL | Freq: Once | INTRAVENOUS | Status: AC
  Administered 2017-01-12: 1000 mL via INTRAVENOUS

## 2017-01-12 MED ORDER — ENOXAPARIN SODIUM 80 MG/0.8ML ~~LOC~~ SOLN
1.0000 mg/kg | SUBCUTANEOUS | Status: DC
  Administered 2017-01-12 – 2017-01-13 (×2): 65 mg via SUBCUTANEOUS
  Filled 2017-01-12 (×2): qty 0.65

## 2017-01-12 MED ORDER — HYDRALAZINE HCL 20 MG/ML IJ SOLN
10.0000 mg | INTRAMUSCULAR | Status: DC | PRN
  Administered 2017-01-12: 10 mg via INTRAVENOUS
  Filled 2017-01-12: qty 0.5
  Filled 2017-01-12: qty 1

## 2017-01-12 MED ORDER — SODIUM CHLORIDE 0.9 % IV SOLN
INTRAVENOUS | Status: DC
  Administered 2017-01-12 – 2017-01-13 (×2): via INTRAVENOUS

## 2017-01-12 MED ORDER — MORPHINE SULFATE (PF) 2 MG/ML IV SOLN
2.0000 mg | INTRAVENOUS | Status: DC | PRN
  Administered 2017-01-12 – 2017-01-16 (×4): 2 mg via INTRAVENOUS
  Filled 2017-01-12 (×4): qty 1

## 2017-01-12 MED ORDER — LIDOCAINE HCL 2 % EX GEL
CUTANEOUS | Status: AC
  Filled 2017-01-12: qty 30

## 2017-01-12 MED ORDER — IOPAMIDOL (ISOVUE-300) INJECTION 61%
INTRAVENOUS | Status: AC
  Filled 2017-01-12: qty 100

## 2017-01-12 NOTE — ED Provider Notes (Addendum)
[facility] DEPT Provider Note   CSN: [id number] Arrival date & time: 01/11/17  1715   History   Chief Complaint Chief Complaint  Patient presents with  . Abdominal Pain    HPI [name] is a 83 y.o. male with h/o of atrial fibrillation on xarelto, HTN, PCM, DM, multiple abdominal hernias, SBO presents to ED for midline abdominal pain associated with distention, nausea, NBNB vomiting x 2 days. States it feels like previous SBO. Last BM 2 days ago. Has tried laxative and dulcolax suppository without relief. No fevers, CP, SOB, dysuria.   HPI  Past Medical History:  Diagnosis Date  . Arthritis   . Atrial fibrillation ([name])   . CKD (chronic kidney disease)   . History of hiatal hernia   . Hx SBO    Last 2013 all treated conservatively  . Hypertension   . Presence of permanent cardiac pacemaker   . Prostate cancer ([name])   . Stroke [facility]) 2017   left facial drooping noted 08/14/2016  . Tachycardia-bradycardia syndrome ([name])   . Type II diabetes mellitus ([name])   . Ventral hernia    x3    Patient Active Problem List   Diagnosis Date Noted  . Hypertension   . PAD (peripheral artery disease) ([place]) 08/14/2016  . CVA (cerebral infarction) 07/23/2015  . Anemia 07/23/2015  . Stroke (cerebrum) ([name]) 07/23/2015  . Chronic kidney disease 07/23/2015  . Acute CVA (cerebrovascular accident) ([name])   . Ventral hernia with obstruction 08/17/2013  . Hypokalemia 08/17/2013  . SBO (small bowel obstruction) ([name]) 02/14/2012  . Abdominal pain 02/14/2012  . Nausea & vomiting 02/14/2012  . PPM-Medtronic 03/13/2010  . Diabetes ([name]) 11/14/2008  . Atrial fibrillation ([place]) 11/14/2008  . BRADYCARDIA-TACHYCARDIA SYNDROME 11/14/2008    Past Surgical History:  Procedure Laterality Date  . ABDOMINAL AORTOGRAM W/LOWER EXTREMITY N/A 08/07/2016   Procedure: Abdominal Aortogram w/Lower Extremity;  Surgeon: Wellington Hampshire, MD;  Location: [name];  Service: Cardiovascular;   Laterality: N/A;  . CHOLECYSTECTOMY OPEN  03/31/2001   Dr Lindon Romp  . COLON SURGERY    . EXPLORATORY LAPAROTOMY  08/2004   Archie Endo 10/01/2010  . HEMORRHOID SURGERY    . HERNIA REPAIR    . HIATAL HERNIA REPAIR  2002  . INCISIONAL HERNIA REPAIR  08/2004   Archie Endo 10/01/2010  . INSERT / REPLACE / REMOVE PACEMAKER    . PERIPHERAL VASCULAR BALLOON ANGIOPLASTY  08/07/2016   Procedure: Peripheral Vascular Balloon Angioplasty;  Surgeon: Wellington Hampshire, MD;  Location: [place] [facility] LAB;  Service: Cardiovascular;;  left popliteal artery  . PERIPHERAL VASCULAR INTERVENTION  08/14/2016   popliteal artery/notes 08/14/2016  . PERIPHERAL VASCULAR INTERVENTION Left 08/14/2016   Procedure: Peripheral Vascular Intervention;  Surgeon: Wellington Hampshire, MD;  Location: [name];  Service: Cardiovascular;  Laterality: Left;  popliteal artery  . PERMANENT PACEMAKER GENERATOR CHANGE N/A 04/06/2012   Procedure: PERMANENT PACEMAKER GENERATOR CHANGE;  Surgeon: Evans Lance, MD;  Location: [name];  Service: Cardiovascular;  Laterality: N/A;  . SMALL INTESTINE SURGERY  09/16/2004   SBO resection, VH repair       Home Medications    Prior to Admission medications   Medication Sig Start Date End Date Taking? Authorizing Provider  bisacodyl (BISACODYL) 5 MG EC tablet Take 5 mg by mouth daily as needed for moderate constipation.   Yes [provider]  bisacodyl (DULCOLAX) 10 MG suppository Place 10 mg rectally daily as needed for moderate  constipation.   Yes [provider]  cyproheptadine (PERIACTIN) 4 MG tablet Take 4 mg by mouth at bedtime.   Yes [provider]  feeding supplement (ENSURE CLINICAL STRENGTH) LIQD Take 237 mLs by mouth 3 (three) times daily as needed. Food supplement   Yes [provider]  furosemide (LASIX) 20 MG tablet Take 1 tablet (20 mg total) by mouth daily. 12/11/16  Yes Evans Lance, MD  losartan-hydrochlorothiazide (HYZAAR) 100-12.5 MG  tablet Take 1 tablet by mouth every evening.   Yes [provider]  metoprolol succinate (TOPROL-XL) 50 MG 24 hr tablet Take 50 mg by mouth every evening.   Yes [provider]  pantoprazole (PROTONIX) 40 MG tablet Take 1 tablet (40 mg total) by mouth daily. 08/15/16  Yes Barrett, Evelene Croon, PA-C  rivaroxaban (XARELTO) 15 MG TABS tablet Take 1 tablet (15 mg total) by mouth daily with supper. 08/07/16  Yes Wellington Hampshire, MD    Family History Family History  Problem Relation Age of Onset  . Pneumonia Father   . Stroke Brother   . Stroke Sister   . Cancer Brother        type unknown    Social History Social History  Substance Use Topics  . Smoking status: Former Smoker    Packs/day: 1.50    Years: 15.00    Quit date: 04/16/1955  . Smokeless tobacco: Former Systems developer    Types: Chew    Quit date: 08/17/1952  . Alcohol use No     Comment: 08/14/2016 "drank beer when I was a teenager"     Allergies   Patient has no known allergies.   Review of Systems Review of Systems  Constitutional: Positive for appetite change. Negative for fever.  Respiratory: Negative for shortness of breath.   Gastrointestinal: Positive for abdominal pain, constipation, nausea and vomiting. Negative for diarrhea.  Genitourinary: Negative for difficulty urinating and dysuria.  Skin: Negative for color change.     Physical Exam Updated Vital Signs BP (!) 141/98   Pulse 96   Temp 98.4 F (36.9 C) (Oral)   Resp 20   SpO2 94%   Physical Exam  Constitutional: He is oriented to person, place, and time. He appears well-developed and well-nourished. No distress.  HENT:  Head: Normocephalic and atraumatic.  Nose: Nose normal.  Mouth/Throat: Oropharynx is clear and moist. No oropharyngeal exudate.  Eyes: Pupils are equal, round, and reactive to light. Conjunctivae and EOM are normal.  Neck: Normal range of motion. Neck supple. No JVD present. No tracheal deviation present.    Cardiovascular: Regular rhythm, normal heart sounds and intact distal pulses.   No murmur heard. Irregularly irregular rhythm  Pulmonary/Chest: Effort normal and breath sounds normal. No respiratory distress. He has no wheezes. He has no rales.  Abdominal: Soft. Bowel sounds are normal. He exhibits distension. There is tenderness. A hernia is present.  Soft, mildly distended abdomen with multiple surgical scars Reducible hernia midline without overlaying skin changes Hyperactive bowel sounds No suprapubic or CVA tenderness   Musculoskeletal: Normal range of motion. He exhibits no deformity.  Lymphadenopathy:    He has no cervical adenopathy.  Neurological: He is alert and oriented to person, place, and time.  Skin: Skin is warm and dry. Capillary refill takes less than 2 seconds.  Psychiatric: He has a normal mood and affect. His behavior is normal. Judgment and thought content normal.  Nursing note and vitals reviewed.    ED Treatments / Results  Labs (all labs ordered are listed, but only abnormal results are displayed) Labs Reviewed  COMPREHENSIVE METABOLIC PANEL - Abnormal; Notable for the following:       Result Value   Glucose, Bld 156 (*)    BUN 22 (*)    Creatinine, Ser 1.65 (*)    ALT 9 (*)    GFR calc non Af Amer 35 (*)    GFR calc Af Amer 41 (*)    All other components within normal limits  CBC - Abnormal; Notable for the following:    WBC 13.0 (*)    Hemoglobin 12.6 (*)    HCT 37.1 (*)    MCV 74.9 (*)    MCH 25.5 (*)    RDW 18.0 (*)    Platelets 142 (*)    All other components within normal limits  URINALYSIS, ROUTINE W REFLEX MICROSCOPIC - Abnormal; Notable for the following:    Ketones, ur TRACE (*)    All other components within normal limits  LIPASE, BLOOD  POC OCCULT BLOOD, ED    EKG  EKG Interpretation None       Radiology Dg Abdomen 1 View  Result Date: 01/12/2017 CLINICAL DATA:  Nasogastric tube placement EXAM: ABDOMEN - 1 VIEW  COMPARISON:  CT abdomen pelvis 01/12/2017 FINDINGS: A nasogastric tube is coiled in the hiatal hernia, just above the diaphragm. Diffusely dilated small bowel throughout the abdomen, mostly gas-filled. IMPRESSION: NG tube coiled in the hiatal hernia. Electronically Signed   By: Ulyses Jarred M.D.   On: 01/12/2017 04:23   Ct Abdomen Pelvis W Contrast  Result Date: 01/12/2017 CLINICAL DATA:  Generalized abdominal pain x2 days with emesis. EXAM: CT ABDOMEN AND PELVIS WITH CONTRAST TECHNIQUE: Multidetector CT imaging of the abdomen and pelvis was performed using the standard protocol following bolus administration of intravenous contrast. CONTRAST:  100mL ISOVUE-300 IOPAMIDOL (ISOVUE-300) INJECTION 61% COMPARISON:  08/17/2013, 08/07/2012 CT CT FINDINGS: Lower chest: Cardiomegaly with small pericardial effusion or thickening, stable in appearance. Moderate-sized hiatal hernia, stable in appearance. Right-sided pacer wires are partially imaged. Small fat containing right Bochdalek hernia. Hepatobiliary: Status post cholecystectomy. No biliary dilatation. Stable 18 mm hypodensity in the right hepatic lobe adjacent to the gallbladder fossa may reflect chronic minimal fatty infiltration. This appears stable since 2014 comparison. Pancreas: Atrophic without ductal dilatation. Spleen: Metallic clips are again seen along the medial margin of the normal size spleen. No splenic mass. Adrenals/Urinary Tract: Normal bilateral adrenal glands. Mild renal cortical thinning right worse than left with tiny too small to characterize cysts bilaterally. No obstructive uropathy. The urinary bladder is physiologically distended. Stomach/Bowel: Physiologic distention of the stomach with previously mentioned moderate-sized hiatal hernia. Normal small bowel rotation. Dilated fluid-filled loops of jejunum up to 3.9 cm in caliber are noted with two supraumbilical ventral hernias containing small bowel medially and large bowel laterally with a  short segment of herniated small bowel to the right of the umbilicus. These however do not appear to be the source of bowel obstruction. There is enhancing mucosa with luminal narrowing involving small bowel loops in the right lower quadrant, series 2, image 70 just beyond small bowel anastomotic sutures in the right hemipelvis as well as on series 2 image 41. These may represent inflammatory strictures. Mid jejunal transmural thickening is also seen in the left hemiabdomen, series 5, image 73. The distal and terminal ileum appear decompressed. The colon is physiologically distended without inflammatory change. Vascular/Lymphatic: Mild aortoiliac atherosclerosis without aneurysm or dissection. No lymphadenopathy. Reproductive:  Prostate is unremarkable. Other: Smaller free fluid is seen in the pelvis. Musculoskeletal: Degenerative disc disease L4-5. No aggressive nor acute osseous abnormality. IMPRESSION: 1. Small-bowel obstruction believed secondary to inflammatory strictures in the right hemipelvis given areas of luminal narrowing and mucosal enhancement seen. Additionally there is transmural thickening of mid jejunal loops in the left hemiabdomen without stricture consistent with small bowel inflammation. Small amount of free fluid in the pelvis. 2. Cardiomegaly with moderate-sized hiatal hernia appears stable. 3. Small right-sided fat containing Bochdalek hernia. 4. Three chronic right-sided ventral hernias, 2 of which are supraumbilical and 1 periumbilical containing small and large bowel loops as above described without causing bowel obstruction. 5. Stable 18 mm hypodensity in the right hepatic lobe which may represent focal fatty infiltration. 6. Aortoiliac atherosclerosis. 7. Degenerative disc disease L4-5. Electronically Signed   By: Ashley Royalty M.D.   On: 01/12/2017 02:39    Procedures Procedures (including critical care time)  Medications Ordered in ED Medications  iopamidol (ISOVUE-300) 61 %  injection (not administered)  lidocaine (XYLOCAINE) 2 % jelly 1 application (not administered)  ondansetron (ZOFRAN) injection 4 mg (not administered)  morphine 2 MG/ML injection 2 mg (not administered)  0.9 %  sodium chloride infusion (not administered)  ondansetron (ZOFRAN-ODT) disintegrating tablet 4 mg (4 mg Oral Given 01/11/17 1813)  iopamidol (ISOVUE-300) 61 % injection 100 mL (80 mLs Intravenous Contrast Given 01/12/17 0157)  sodium chloride 0.9 % bolus 1,000 mL (1,000 mLs Intravenous New Bag/Given 01/12/17 0347)     Initial Impression / Assessment and Plan / ED Course  I have reviewed the triage vital signs and the nursing notes.  Pertinent labs & imaging results that were available during my care of the patient were reviewed by me and considered in my medical decision making (see chart for details).  Clinical Course as of Jan 12 530  Sun Jan 12, 2017  9983 IMPRESSION: 1. Small-bowel obstruction believed secondary to inflammatory strictures in the right hemipelvis given areas of luminal narrowing and mucosal enhancement seen. Additionally there is transmural thickening of mid jejunal loops in the left hemiabdomen without stricture consistent with small bowel inflammation. Small amount of free fluid in the pelvis. 2. Cardiomegaly with moderate-sized hiatal hernia appears stable. 3. Small right-sided fat containing Bochdalek hernia. 4. Three chronic right-sided ventral hernias, 2 of which are supraumbilical and 1 periumbilical containing small and large bowel loops as above described without causing bowel obstruction. 5. Stable 18 mm hypodensity in the right hepatic lobe which may represent focal fatty infiltration. 6. Aortoiliac atherosclerosis. 7. Degenerative disc disease L4-5. CT Abdomen Pelvis W Contrast [CG]  3825 IMPRESSION: NG tube coiled in the hiatal hernia. DG Abdomen 1 View [CG]    Clinical Course User Index [CG] Kinnie Feil, PA-C   CT scan shows SBO. Only  mild leukocytosis WBC 13. He is HD stable. Pt was kept NPO. ED RN unable to place NG tube in ED. Pt received IVF and zofran in ED. Spoke to general surgery (Dr. Hassell Done) who will evaluate patient later today. Spoke to IR (Dr. Anselm Pancoast) who will place NGT. I have placed order for NGT with fluoro. Spoke to Dr. Blaine Hamper who will admit.   Final Clinical Impressions(s) / ED Diagnoses   Final diagnoses:  SBO (small bowel obstruction) [facility])    New Prescriptions New Prescriptions   No medications on file     Arlean Hopping 01/12/17 0531    Palumbo, April, MD 01/12/17 0540  Addendum: Pt had  a run of 6 VT in ED. His HR has been < 110 on cardiac monitor, went up into 120s during NGT insertion attempts. Advised ED RNs to avoid NGT placement, will be done by IR.    Kinnie Feil, PA-C 01/12/17 0604    Randal Buba, April, MD 01/12/17 [phone]

## 2017-01-12 NOTE — H&P (Signed)
History and Physical    BRODE SCULLEY TKW:[id number] DOB: 29-Sep-1923 DOA: 01/12/2017  PCP: Foye Spurling, MD  Patient coming from: home  I have personally briefly reviewed patient's old medical records in [place]  Chief Complaint: vomiting abdominal pain  HPI: [name] is a 83 y.o. male with medical history significant for multiple abdominal surgeries including hiatal hernia repair, open cholecystectomy, colon surgery, exploratory laparotomy, hemorrhoid surgery, hernia repair, small intestinal surgery, history of small bowel obstruction treated conservatively admitted today with complaints of nausea vomiting abdominal distention and abdominal pain. His last bowel movement was 2 days prior to admission to the hospital. He feels this is similar to the small bowel obstruction he had in the past. CT scan done in the emergency room showed small bowel obstruction secondary to inflammatory strictures in the right hemipelvis given areas of luminal narrowing and mucosal enhancement. Transmural thickening of mid incisional loops in the left hemiabdomen without stricture consistent with small bowel inflammation. And small amount of free fluid in the pelvis he has 3 chronic right-sided ventral hernias. He denies any fever or chills chest pain shortness of breath or cough headaches or changes with his vision or urinary complaints at this time.  ED Course: Surgery was consulted by the ER physician. And was advised to put an NG tube. NG tube was placed in the ER. X-ray showed the tube was coiled in the hernia. 7 the NG tube was taken out. NG tube was going to be placed by IR interventional radiology. Patient refused any further NG tube placement. Patient had few runs of V. tach during this NG tube placement. Review of Systems: As per HPI otherwise 10 point review of systems negative. Will  Past Medical History:  Diagnosis Date  . Arthritis   . Atrial fibrillation ([place])   . CKD (chronic  kidney disease)   . History of hiatal hernia   . Hx SBO    Last 2013 all treated conservatively  . Hypertension   . Presence of permanent cardiac pacemaker   . Prostate cancer ([place])   . Stroke [facility]) 2017   left facial drooping noted 08/14/2016  . Tachycardia-bradycardia syndrome ([place])   . Type II diabetes mellitus ([place])   . Ventral hernia    x3    Past Surgical History:  Procedure Laterality Date  . ABDOMINAL AORTOGRAM W/LOWER EXTREMITY N/A 08/07/2016   Procedure: Abdominal Aortogram w/Lower Extremity;  Surgeon: Wellington Hampshire, MD;  Location: [facility];  Service: Cardiovascular;  Laterality: N/A;  . CHOLECYSTECTOMY OPEN  03/31/2001   Dr Lindon Romp  . COLON SURGERY    . EXPLORATORY LAPAROTOMY  08/2004   Archie Endo 10/01/2010  . HEMORRHOID SURGERY    . HERNIA REPAIR    . HIATAL HERNIA REPAIR  2002  . INCISIONAL HERNIA REPAIR  08/2004   Archie Endo 10/01/2010  . INSERT / REPLACE / REMOVE PACEMAKER    . PERIPHERAL VASCULAR BALLOON ANGIOPLASTY  08/07/2016   Procedure: Peripheral Vascular Balloon Angioplasty;  Surgeon: Wellington Hampshire, MD;  Location: [place] [facility];  Service: Cardiovascular;;  left popliteal artery  . PERIPHERAL VASCULAR INTERVENTION  08/14/2016   popliteal artery/notes 08/14/2016  . PERIPHERAL VASCULAR INTERVENTION Left 08/14/2016   Procedure: Peripheral Vascular Intervention;  Surgeon: Wellington Hampshire, MD;  Location: [place] [facility];  Service: Cardiovascular;  Laterality: Left;  popliteal artery  . PERMANENT PACEMAKER GENERATOR CHANGE N/A 04/06/2012   Procedure: PERMANENT PACEMAKER GENERATOR CHANGE;  Surgeon: Carleene Overlie  Peyton Najjar, MD;  Location: [facility];  Service: Cardiovascular;  Laterality: N/A;  . SMALL INTESTINE SURGERY  09/16/2004   SBO resection, VH repair     reports that he quit smoking about 61 years ago. He has a 22.50 pack-year smoking history. He quit smokeless tobacco use about 64 years ago. His smokeless tobacco use included Chew. He reports that he  does not drink alcohol or use drugs.  No Known Allergies  Family History  Problem Relation Age of Onset  . Pneumonia Father   . Stroke Brother   . Stroke Sister   . Cancer Brother        type unknown   Unacceptable: Noncontributory, unremarkable, or negative. Acceptable: Family history reviewed and not pertinent (If you reviewed it)  Prior to Admission medications   Medication Sig Start Date End Date Taking? Authorizing Provider  bisacodyl (BISACODYL) 5 MG EC tablet Take 5 mg by mouth daily as needed for moderate constipation.   Yes [provider]  bisacodyl (DULCOLAX) 10 MG suppository Place 10 mg rectally daily as needed for moderate constipation.   Yes [provider]  cyproheptadine (PERIACTIN) 4 MG tablet Take 4 mg by mouth at bedtime.   Yes [provider]  feeding supplement (ENSURE CLINICAL STRENGTH) LIQD Take 237 mLs by mouth 3 (three) times daily as needed. Food supplement   Yes [provider]  furosemide (LASIX) 20 MG tablet Take 1 tablet (20 mg total) by mouth daily. 12/11/16  Yes Evans Lance, MD  losartan-hydrochlorothiazide (HYZAAR) 100-12.5 MG tablet Take 1 tablet by mouth every evening.   Yes [provider]  metoprolol succinate (TOPROL-XL) 50 MG 24 hr tablet Take 50 mg by mouth every evening.   Yes [provider]  pantoprazole (PROTONIX) 40 MG tablet Take 1 tablet (40 mg total) by mouth daily. 08/15/16  Yes Barrett, Evelene Croon, PA-C  rivaroxaban (XARELTO) 15 MG TABS tablet Take 1 tablet (15 mg total) by mouth daily with supper. 08/07/16  Yes Wellington Hampshire, MD    Physical Exam: Vitals:   01/12/17 0630 01/12/17 0700 01/12/17 0730 01/12/17 0800  BP: (!) 153/87 133/75 (!) 148/99   Pulse: (!) 113 80 100   Resp: 19 15 16    Temp:    98.3 F (36.8 C)  TempSrc:    Oral  SpO2: 99% 100% 100%     Constitutional: NAD, calm, comfortable Vitals:   01/12/17 0630 01/12/17 0700 01/12/17 0730 01/12/17 0800  BP: (!)  153/87 133/75 (!) 148/99   Pulse: (!) 113 80 100   Resp: 19 15 16    Temp:    98.3 F (36.8 C)  TempSrc:    Oral  SpO2: 99% 100% 100%    Eyes: PERRL, lids and conjunctivae normal ENMT: Mucous membranes are moist. Posterior pharynx clear of any exudate or lesions.Normal dentition.  Neck: normal, supple, no masses, no thyromegaly Respiratory: clear to auscultation bilaterally, no wheezing, no crackles. Normal respiratory effort. No accessory muscle use.  Cardiovascular: Regular rate and rhythm, no murmurs / rubs / gallops. No extremity edema. 2+ pedal pulses. No carotid bruits.  Abdomen: multiple scars,generalized tenderness,hyperactive bowel sounds. Musculoskeletal: no clubbing / cyanosis. No joint deformity upper and lower extremities. Good ROM, no contractures. Normal muscle tone.  Skin: no rashes, lesions, ulcers. No induration Neurologic: CN 2-12 grossly intact. Sensation intact, DTR normal. Strength 5/5 in all 4.  Psychiatric: Normal judgment and insight. Alert and oriented x 3. Normal mood.  Labs on  Admission: I have personally reviewed following labs and imaging studies  CBC:  Recent Labs Lab 01/11/17 1954  WBC 13.0*  HGB 12.6*  HCT 37.1*  MCV 74.9*  PLT 485*   Basic Metabolic Panel:  Recent Labs Lab 01/11/17 1954  NA 140  K 4.6  CL 104  CO2 25  GLUCOSE 156*  BUN 22*  CREATININE 1.65*  CALCIUM 9.3   GFR: CrCl cannot be calculated (Unknown ideal weight.). Liver Function Tests:  Recent Labs Lab 01/11/17 1954  AST 25  ALT 9*  ALKPHOS 79  BILITOT 1.1  PROT 8.1  ALBUMIN 4.6    Recent Labs Lab 01/11/17 1954  LIPASE 32   No results for input(s): AMMONIA in the last 168 hours. Coagulation Profile: No results for input(s): INR, PROTIME in the last 168 hours. Cardiac Enzymes: No results for input(s): CKTOTAL, CKMB, CKMBINDEX, TROPONINI in the last 168 hours. BNP (last 3 results) No results for input(s): PROBNP in the last 8760 hours. HbA1C: No  results for input(s): HGBA1C in the last 72 hours. CBG: No results for input(s): GLUCAP in the last 168 hours. Lipid Profile: No results for input(s): CHOL, HDL, LDLCALC, TRIG, CHOLHDL, LDLDIRECT in the last 72 hours. Thyroid Function Tests: No results for input(s): TSH, T4TOTAL, FREET4, T3FREE, THYROIDAB in the last 72 hours. Anemia Panel: No results for input(s): VITAMINB12, FOLATE, FERRITIN, TIBC, IRON, RETICCTPCT in the last 72 hours. Urine analysis:    Component Value Date/Time   COLORURINE YELLOW 01/11/2017 2352   APPEARANCEUR CLEAR 01/11/2017 2352   LABSPEC 1.020 01/11/2017 2352   PHURINE 6.0 01/11/2017 2352   GLUCOSEU NEGATIVE 01/11/2017 2352   HGBUR NEGATIVE 01/11/2017 2352   BILIRUBINUR NEGATIVE 01/11/2017 2352   KETONESUR TRACE (A) 01/11/2017 2352   PROTEINUR NEGATIVE 01/11/2017 2352   UROBILINOGEN 0.2 08/17/2013 0945   NITRITE NEGATIVE 01/11/2017 2352   LEUKOCYTESUR NEGATIVE 01/11/2017 2352    Radiological Exams on Admission: Dg Abdomen 1 View  Result Date: 01/12/2017 CLINICAL DATA:  Nasogastric tube placement EXAM: ABDOMEN - 1 VIEW COMPARISON:  CT abdomen pelvis 01/12/2017 FINDINGS: A nasogastric tube is coiled in the hiatal hernia, just above the diaphragm. Diffusely dilated small bowel throughout the abdomen, mostly gas-filled. IMPRESSION: NG tube coiled in the hiatal hernia. Electronically Signed   By: Ulyses Jarred M.D.   On: 01/12/2017 04:23   Ct Abdomen Pelvis W Contrast  Result Date: 01/12/2017 CLINICAL DATA:  Generalized abdominal pain x2 days with emesis. EXAM: CT ABDOMEN AND PELVIS WITH CONTRAST TECHNIQUE: Multidetector CT imaging of the abdomen and pelvis was performed using the standard protocol following bolus administration of intravenous contrast. CONTRAST:  36mL ISOVUE-300 IOPAMIDOL (ISOVUE-300) INJECTION 61% COMPARISON:  08/17/2013, 08/07/2012 CT CT FINDINGS: Lower chest: Cardiomegaly with small pericardial effusion or thickening, stable in appearance.  Moderate-sized hiatal hernia, stable in appearance. Right-sided pacer wires are partially imaged. Small fat containing right Bochdalek hernia. Hepatobiliary: Status post cholecystectomy. No biliary dilatation. Stable 18 mm hypodensity in the right hepatic lobe adjacent to the gallbladder fossa may reflect chronic minimal fatty infiltration. This appears stable since 2014 comparison. Pancreas: Atrophic without ductal dilatation. Spleen: Metallic clips are again seen along the medial margin of the normal size spleen. No splenic mass. Adrenals/Urinary Tract: Normal bilateral adrenal glands. Mild renal cortical thinning right worse than left with tiny too small to characterize cysts bilaterally. No obstructive uropathy. The urinary bladder is physiologically distended. Stomach/Bowel: Physiologic distention of the stomach with previously mentioned moderate-sized hiatal hernia. Normal small bowel rotation.  Dilated fluid-filled loops of jejunum up to 3.9 cm in caliber are noted with two supraumbilical ventral hernias containing small bowel medially and large bowel laterally with a short segment of herniated small bowel to the right of the umbilicus. These however do not appear to be the source of bowel obstruction. There is enhancing mucosa with luminal narrowing involving small bowel loops in the right lower quadrant, series 2, image 70 just beyond small bowel anastomotic sutures in the right hemipelvis as well as on series 2 image 41. These may represent inflammatory strictures. Mid jejunal transmural thickening is also seen in the left hemiabdomen, series 5, image 73. The distal and terminal ileum appear decompressed. The colon is physiologically distended without inflammatory change. Vascular/Lymphatic: Mild aortoiliac atherosclerosis without aneurysm or dissection. No lymphadenopathy. Reproductive: Prostate is unremarkable. Other: Smaller free fluid is seen in the pelvis. Musculoskeletal: Degenerative disc disease  L4-5. No aggressive nor acute osseous abnormality. IMPRESSION: 1. Small-bowel obstruction believed secondary to inflammatory strictures in the right hemipelvis given areas of luminal narrowing and mucosal enhancement seen. Additionally there is transmural thickening of mid jejunal loops in the left hemiabdomen without stricture consistent with small bowel inflammation. Small amount of free fluid in the pelvis. 2. Cardiomegaly with moderate-sized hiatal hernia appears stable. 3. Small right-sided fat containing Bochdalek hernia. 4. Three chronic right-sided ventral hernias, 2 of which are supraumbilical and 1 periumbilical containing small and large bowel loops as above described without causing bowel obstruction. 5. Stable 18 mm hypodensity in the right hepatic lobe which may represent focal fatty infiltration. 6. Aortoiliac atherosclerosis. 7. Degenerative disc disease L4-5. Electronically Signed   By: Ashley Royalty M.D.   On: 01/12/2017 02:39    EKG: Independently reviewed.  Assessment/Plan Active Problems:   SBO (small bowel obstruction) ([name])   SOB (shortness of breath)  Small bowel obstruction in a patient with history of mental multiple abdominal surgeries. IR to place NG tube. IV fluid hydration. Patient will be nothing by mouth. Follow-up KUB tomorrow. Change all his by mouth medications to IV. Surgery following.   History of atrial fibrillation on Gerardo 50 mg daily continue.  Hypertension continue losartan. Patient does take furosemide 20 mg daily and hydrochlorothiazide 12.5 mg at home. I will hold his diuretics since he is nothing by mouth hydrate him slowly. Restart diuretics as needed.  History of A. fib and history of stroke on xarelto.  History of CK D creatinine stable at 1.65.   Leukocytosis white count 13,000 no evidence of bandemia. Follow-up levels tomorrow.  History of tachybradycardia syndrome and permanent pacemaker.       DVT prophylaxis Code Status:  Family  Communication Disposition Plan:  Consults called:  Admission status   Georgette Shell MD [facility]   If 7PM-7AM, please contact night-coverage [email or web address] Password [facility]  01/12/2017, 9:29 AM

## 2017-01-12 NOTE — Consult Note (Signed)
Chief Complaint:  Abdominal pain and distention  History of Present Illness:  [name] is an 83 y.o. male admitted through the ER with abdominal pain and partial SBO.  He has had multiple abdominal surgeries including open hiatal hernia surgery.  He was seen in the ER by me and IR has been consulted to place an NG tube.    Past Medical History:  Diagnosis Date  . Arthritis   . Atrial fibrillation ([place])   . CKD (chronic kidney disease)   . History of hiatal hernia   . Hx SBO    Last 2013 all treated conservatively  . Hypertension   . Presence of permanent cardiac pacemaker   . Prostate cancer ([place])   . Stroke [facility]) 2017   left facial drooping noted 08/14/2016  . Tachycardia-bradycardia syndrome ([place])   . Type II diabetes mellitus ([place])   . Ventral hernia    x3    Past Surgical History:  Procedure Laterality Date  . ABDOMINAL AORTOGRAM W/LOWER EXTREMITY N/A 08/07/2016   Procedure: Abdominal Aortogram w/Lower Extremity;  Surgeon: Wellington Hampshire, MD;  Location: [place] [facility];  Service: Cardiovascular;  Laterality: N/A;  . CHOLECYSTECTOMY OPEN  03/31/2001   Dr Lindon Romp  . COLON SURGERY    . EXPLORATORY LAPAROTOMY  08/2004   Archie Endo 10/01/2010  . HEMORRHOID SURGERY    . HERNIA REPAIR    . HIATAL HERNIA REPAIR  2002  . INCISIONAL HERNIA REPAIR  08/2004   Archie Endo 10/01/2010  . INSERT / REPLACE / REMOVE PACEMAKER    . PERIPHERAL VASCULAR BALLOON ANGIOPLASTY  08/07/2016   Procedure: Peripheral Vascular Balloon Angioplasty;  Surgeon: Wellington Hampshire, MD;  Location: [place] [facility];  Service: Cardiovascular;;  left popliteal artery  . PERIPHERAL VASCULAR INTERVENTION  08/14/2016   popliteal artery/notes 08/14/2016  . PERIPHERAL VASCULAR INTERVENTION Left 08/14/2016   Procedure: Peripheral Vascular Intervention;  Surgeon: Wellington Hampshire, MD;  Location: [place] [facility];  Service: Cardiovascular;  Laterality: Left;  popliteal artery  . PERMANENT PACEMAKER GENERATOR CHANGE N/A  04/06/2012   Procedure: PERMANENT PACEMAKER GENERATOR CHANGE;  Surgeon: Evans Lance, MD;  Location: [facility] CATH LAB;  Service: Cardiovascular;  Laterality: N/A;  . SMALL INTESTINE SURGERY  09/16/2004   SBO resection, VH repair    Current Facility-Administered Medications  Medication Dose Route Frequency Provider Last Rate Last Dose  . 0.9 %  sodium chloride infusion   Intravenous Continuous Ivor Costa, MD 75 mL/hr at 01/12/17 [phone]    . iopamidol (ISOVUE-300) 61 % injection           . morphine 2 MG/ML injection 2 mg  2 mg Intravenous Q4H PRN Ivor Costa, MD      . ondansetron [facility]) injection 4 mg  4 mg Intravenous Q8H PRN Ivor Costa, MD       Patient has no known allergies. Family History  Problem Relation Age of Onset  . Pneumonia Father   . Stroke Brother   . Stroke Sister   . Cancer Brother        type unknown   Social History:   reports that he quit smoking about 61 years ago. He has a 22.50 pack-year smoking history. He quit smokeless tobacco use about 64 years ago. His smokeless tobacco use included Chew. He reports that he does not drink alcohol or use drugs.   REVIEW OF SYSTEMS : Negative except for see problem list  Physical Exam:   Blood pressure Marland Kitchen)  148/99, pulse 100, temperature 98.3 F (36.8 C), temperature source Oral, resp. rate 16, SpO2 100 %. There is no height or weight on file to calculate BMI.  Gen:  WDWN elderly but mentally clear AAM   Neurological: Alert and oriented to person, place, and time. Motor and sensory function is grossly intact  Head: Normocephalic and atraumatic.  Abdomen:  Old midline scar with evidence of secondary healing in the past.  Soft palpable hernia on the right side  LABORATORY RESULTS: Results for orders placed or performed during the hospital encounter of 01/12/17 (from the past 48 hour(s))  Lipase, blood     Status: None   Collection Time: 01/11/17  7:54 PM  Result Value Ref Range   Lipase 32 11 - 51 U/L  Comprehensive  metabolic panel     Status: Abnormal   Collection Time: 01/11/17  7:54 PM  Result Value Ref Range   Sodium 140 135 - 145 mmol/L   Potassium 4.6 3.5 - 5.1 mmol/L   Chloride 104 101 - 111 mmol/L   CO2 25 22 - 32 mmol/L   Glucose, Bld 156 (H) 65 - 99 mg/dL   BUN 22 (H) 6 - 20 mg/dL   Creatinine, Ser 1.65 (H) 0.61 - 1.24 mg/dL   Calcium 9.3 8.9 - 10.3 mg/dL   Total Protein 8.1 6.5 - 8.1 g/dL   Albumin 4.6 3.5 - 5.0 g/dL   AST 25 15 - 41 U/L   ALT 9 (L) 17 - 63 U/L   Alkaline Phosphatase 79 38 - 126 U/L   Total Bilirubin 1.1 0.3 - 1.2 mg/dL   GFR calc non Af Amer 35 (L) >60 mL/min   GFR calc Af Amer 41 (L) >60 mL/min    Comment: (NOTE) The eGFR has been calculated using the CKD EPI equation. This calculation has not been validated in all clinical situations. eGFR's persistently <60 mL/min signify possible Chronic Kidney Disease.    Anion gap 11 5 - 15  CBC     Status: Abnormal   Collection Time: 01/11/17  7:54 PM  Result Value Ref Range   WBC 13.0 (H) 4.0 - 10.5 K/uL   RBC 4.95 4.22 - 5.81 MIL/uL   Hemoglobin 12.6 (L) 13.0 - 17.0 g/dL   HCT 37.1 (L) 39.0 - 52.0 %   MCV 74.9 (L) 78.0 - 100.0 fL   MCH 25.5 (L) 26.0 - 34.0 pg   MCHC 34.0 30.0 - 36.0 g/dL   RDW 18.0 (H) 11.5 - 15.5 %   Platelets 142 (L) 150 - 400 K/uL  Urinalysis, Routine w reflex microscopic     Status: Abnormal   Collection Time: 01/11/17 11:52 PM  Result Value Ref Range   Color, Urine YELLOW YELLOW   APPearance CLEAR CLEAR   Specific Gravity, Urine 1.020 1.005 - 1.030   pH 6.0 5.0 - 8.0   Glucose, UA NEGATIVE NEGATIVE mg/dL   Hgb urine dipstick NEGATIVE NEGATIVE   Bilirubin Urine NEGATIVE NEGATIVE   Ketones, ur TRACE (A) NEGATIVE mg/dL   Protein, ur NEGATIVE NEGATIVE mg/dL   Nitrite NEGATIVE NEGATIVE   Leukocytes, UA NEGATIVE NEGATIVE    Comment: Microscopic not done on urines with negative protein, blood, leukocytes, nitrite, or glucose < 500 mg/dL.  POC occult blood, ED Provider will collect      Status: None   Collection Time: 01/12/17  1:17 AM  Result Value Ref Range   Fecal Occult Bld NEGATIVE NEGATIVE     RADIOLOGY RESULTS: Dg  Abdomen 1 View  Result Date: 01/12/2017 CLINICAL DATA:  Nasogastric tube placement EXAM: ABDOMEN - 1 VIEW COMPARISON:  CT abdomen pelvis 01/12/2017 FINDINGS: A nasogastric tube is coiled in the hiatal hernia, just above the diaphragm. Diffusely dilated small bowel throughout the abdomen, mostly gas-filled. IMPRESSION: NG tube coiled in the hiatal hernia. Electronically Signed   By: Ulyses Jarred M.D.   On: 01/12/2017 04:23   Ct Abdomen Pelvis W Contrast  Result Date: 01/12/2017 CLINICAL DATA:  Generalized abdominal pain x2 days with emesis. EXAM: CT ABDOMEN AND PELVIS WITH CONTRAST TECHNIQUE: Multidetector CT imaging of the abdomen and pelvis was performed using the standard protocol following bolus administration of intravenous contrast. CONTRAST:  59m ISOVUE-300 IOPAMIDOL (ISOVUE-300) INJECTION 61% COMPARISON:  08/17/2013, 08/07/2012 CT CT FINDINGS: Lower chest: Cardiomegaly with small pericardial effusion or thickening, stable in appearance. Moderate-sized hiatal hernia, stable in appearance. Right-sided pacer wires are partially imaged. Small fat containing right Bochdalek hernia. Hepatobiliary: Status post cholecystectomy. No biliary dilatation. Stable 18 mm hypodensity in the right hepatic lobe adjacent to the gallbladder fossa may reflect chronic minimal fatty infiltration. This appears stable since 2014 comparison. Pancreas: Atrophic without ductal dilatation. Spleen: Metallic clips are again seen along the medial margin of the normal size spleen. No splenic mass. Adrenals/Urinary Tract: Normal bilateral adrenal glands. Mild renal cortical thinning right worse than left with tiny too small to characterize cysts bilaterally. No obstructive uropathy. The urinary bladder is physiologically distended. Stomach/Bowel: Physiologic distention of the stomach with  previously mentioned moderate-sized hiatal hernia. Normal small bowel rotation. Dilated fluid-filled loops of jejunum up to 3.9 cm in caliber are noted with two supraumbilical ventral hernias containing small bowel medially and large bowel laterally with a short segment of herniated small bowel to the right of the umbilicus. These however do not appear to be the source of bowel obstruction. There is enhancing mucosa with luminal narrowing involving small bowel loops in the right lower quadrant, series 2, image 70 just beyond small bowel anastomotic sutures in the right hemipelvis as well as on series 2 image 41. These may represent inflammatory strictures. Mid jejunal transmural thickening is also seen in the left hemiabdomen, series 5, image 73. The distal and terminal ileum appear decompressed. The colon is physiologically distended without inflammatory change. Vascular/Lymphatic: Mild aortoiliac atherosclerosis without aneurysm or dissection. No lymphadenopathy. Reproductive: Prostate is unremarkable. Other: Smaller free fluid is seen in the pelvis. Musculoskeletal: Degenerative disc disease L4-5. No aggressive nor acute osseous abnormality. IMPRESSION: 1. Small-bowel obstruction believed secondary to inflammatory strictures in the right hemipelvis given areas of luminal narrowing and mucosal enhancement seen. Additionally there is transmural thickening of mid jejunal loops in the left hemiabdomen without stricture consistent with small bowel inflammation. Small amount of free fluid in the pelvis. 2. Cardiomegaly with moderate-sized hiatal hernia appears stable. 3. Small right-sided fat containing Bochdalek hernia. 4. Three chronic right-sided ventral hernias, 2 of which are supraumbilical and 1 periumbilical containing small and large bowel loops as above described without causing bowel obstruction. 5. Stable 18 mm hypodensity in the right hepatic lobe which may represent focal fatty infiltration. 6. Aortoiliac  atherosclerosis. 7. Degenerative disc disease L4-5. Electronically Signed   By: DAshley RoyaltyM.D.   On: 01/12/2017 02:39    Problem List: Patient Active Problem List   Diagnosis Date Noted  . SOB (shortness of breath) 01/12/2017  . Hypertension   . P[place] 08/14[place]  . CVA (cerebral infarction) 07/23/2015  . Anemia 07/23/2015  .  Stroke (cerebrum) ([place]) 07/23/2015  . Chronic kidney disease 07/23/2015  . Acute CVA (cerebrovascular accident) ([place])   . Ventral hernia with obstruction 08/17/2013  . Hypokalemia 08/17/2013  . SBO (small bowel obstruction) ([place]) 02/14/2012  . Abdominal pain 02/14/2012  . Nausea & vomiting 02/14/2012  . PPM-Medtronic 03/13/2010  . Diabetes ([place]) 11/14/2008  . Atrial fibrillation ([place]) 11/14/2008  . BRADYCARDIA-TACHYCARDIA SYNDROME 11/14/2008    Assessment & Plan: 83 year old with partial SBO;  Agree with IR placement of NG and observation.  Will follow with you.      Matt B. Hassell Done, MD, [facility] [phone] beeper [phone]  01/12/2017 9:24 AM

## 2017-01-12 NOTE — Progress Notes (Addendum)
This is a no charge note  Pending admission per PA, 88  83 year old male with history of hypertension, diabetes mellitus, stroke, GERD, prostate cancer, small bowel obstruction, atrial fibrillation on Xarelto, CKD-3, pacemaker placement due to tachycardia-bradycardia syndrome, who presents with nausea, vomiting or abdominal pain. Last bowel movement was 2 days ago. Patient was found to have small bowel obstruction by CT scan. Per ED physician, patient had short run of VT when ED physician tried to put NG tube without success. EDP will consult general surgeon and INR. Pt is admitted to SDU bed as inpatient.    Matthew Costa, MD  [facility] Pager [phone]  If 7PM-7AM, please contact night-coverage [email or web address] Password [email or web address] 01/12/2017, 5:40 AM

## 2017-01-12 NOTE — ED Notes (Signed)
MD at bedside. 

## 2017-01-12 NOTE — ED Notes (Signed)
PA was advised of pt's present NGT placement (coiled around hiatal hernia above diaphragm)---- received verbal order to remove NGT.  Pt was advised to insert a smaller size but pt refused further attempts of insertion---- PA was made aware.

## 2017-01-12 NOTE — ED Notes (Signed)
Bed: WA10 Expected date:  Expected time:  Means of arrival:  Comments: 

## 2017-01-13 ENCOUNTER — Inpatient Hospital Stay (HOSPITAL_COMMUNITY): Payer: Medicare Other

## 2017-01-13 DIAGNOSIS — I481 Persistent atrial fibrillation: Secondary | ICD-10-CM

## 2017-01-13 DIAGNOSIS — E440 Moderate protein-calorie malnutrition: Secondary | ICD-10-CM | POA: Insufficient documentation

## 2017-01-13 DIAGNOSIS — E1110 Type 2 diabetes mellitus with ketoacidosis without coma: Secondary | ICD-10-CM

## 2017-01-13 LAB — CBC
HCT: 34.2 % — ABNORMAL LOW (ref 39.0–52.0)
HEMATOCRIT: 33.7 % — AB (ref 39.0–52.0)
HEMOGLOBIN: 11.2 g/dL — AB (ref 13.0–17.0)
Hemoglobin: 11.8 g/dL — ABNORMAL LOW (ref 13.0–17.0)
MCH: 25.9 pg — AB (ref 26.0–34.0)
MCH: 24.8 pg — ABNORMAL LOW (ref 26.0–34.0)
MCHC: 34.5 g/dL (ref 30.0–36.0)
MCHC: 33.2 g/dL (ref 30.0–36.0)
MCV: 75 fL — ABNORMAL LOW (ref 78.0–100.0)
MCV: 74.7 fL — ABNORMAL LOW (ref 78.0–100.0)
PLATELETS: 121 10*3/uL — AB (ref 150–400)
Platelets: 112 10*3/uL — ABNORMAL LOW (ref 150–400)
RBC: 4.56 MIL/uL (ref 4.22–5.81)
RBC: 4.51 MIL/uL (ref 4.22–5.81)
RDW: 18.5 % — AB (ref 11.5–15.5)
RDW: 18.5 % — AB (ref 11.5–15.5)
WBC: 8.7 10*3/uL (ref 4.0–10.5)
WBC: 7.3 10*3/uL (ref 4.0–10.5)

## 2017-01-13 LAB — BASIC METABOLIC PANEL
Anion gap: 12 (ref 5–15)
BUN: 27 mg/dL — ABNORMAL HIGH (ref 6–20)
CALCIUM: 8.5 mg/dL — AB (ref 8.9–10.3)
CO2: 18 mmol/L — AB (ref 22–32)
CREATININE: 1.79 mg/dL — AB (ref 0.61–1.24)
Chloride: 112 mmol/L — ABNORMAL HIGH (ref 101–111)
GFR calc Af Amer: 37 mL/min — ABNORMAL LOW (ref >60)
GFR calc non Af Amer: 32 mL/min — ABNORMAL LOW (ref >60)
GLUCOSE: 81 mg/dL (ref 65–99)
Potassium: 4.4 mmol/L (ref 3.5–5.1)
Sodium: 142 mmol/L (ref 135–145)

## 2017-01-13 LAB — ABO/RH: ABO/RH(D): A POS

## 2017-01-13 LAB — GLUCOSE, CAPILLARY
GLUCOSE-CAPILLARY: 117 mg/dL — AB (ref 65–99)
GLUCOSE-CAPILLARY: 71 mg/dL (ref 65–99)
GLUCOSE-CAPILLARY: 97 mg/dL (ref 65–99)
Glucose-Capillary: 103 mg/dL — ABNORMAL HIGH (ref 65–99)

## 2017-01-13 LAB — TYPE AND SCREEN
ABO/RH(D): A POS
ANTIBODY SCREEN: NEGATIVE

## 2017-01-13 LAB — HEMOGLOBIN A1C
HEMOGLOBIN A1C: 6.7 % — AB (ref 4.8–5.6)
Mean Plasma Glucose: 145.59 mg/dL

## 2017-01-13 MED ORDER — METOPROLOL TARTRATE 5 MG/5ML IV SOLN
2.5000 mg | Freq: Three times a day (TID) | INTRAVENOUS | Status: DC
  Filled 2017-01-13: qty 5

## 2017-01-13 MED ORDER — SODIUM CHLORIDE 0.9 % IV BOLUS (SEPSIS)
500.0000 mL | Freq: Once | INTRAVENOUS | Status: AC
  Administered 2017-01-13: 500 mL via INTRAVENOUS

## 2017-01-13 MED ORDER — PANTOPRAZOLE SODIUM 40 MG IV SOLR
40.0000 mg | Freq: Two times a day (BID) | INTRAVENOUS | Status: DC
Start: 2017-01-13 — End: 2017-01-16
  Administered 2017-01-13 – 2017-01-16 (×6): 40 mg via INTRAVENOUS
  Filled 2017-01-13 (×6): qty 40

## 2017-01-13 MED ORDER — KCL IN DEXTROSE-NACL 10-5-0.45 MEQ/L-%-% IV SOLN
INTRAVENOUS | Status: DC
  Administered 2017-01-13 – 2017-01-15 (×5): via INTRAVENOUS
  Filled 2017-01-13 (×9): qty 1000

## 2017-01-13 MED ORDER — INSULIN ASPART 100 UNIT/ML ~~LOC~~ SOLN
0.0000 [IU] | SUBCUTANEOUS | Status: DC
  Administered 2017-01-14 – 2017-01-16 (×3): 1 [IU] via SUBCUTANEOUS

## 2017-01-13 NOTE — Progress Notes (Signed)
Assessment Active Problems:   SBO (small bowel obstruction) ([name])-feels better; a little flatus; x-ray pending     Plan:  Continue non-operative management.   LOS: 1 day        Chief Complaint/Subjective: He reports that he feels better and passed a little gas.  Objective: Vital signs in last 24 hours: Temp:  [97.6 F (36.4 C)-98.4 F (36.9 C)] 98.2 F (36.8 C) (08/26 2346) Pulse Rate:  [52-116] 71 (08/27 0700) Resp:  [12-27] 22 (08/27 0700) BP: (90-181)/(46-106) 101/63 (08/27 0700) SpO2:  [97 %-100 %] 99 % (08/27 0700) Weight:  [67.1 kg (147 lb 14.9 oz)] 67.1 kg (147 lb 14.9 oz) (08/26 0900) Last BM Date: 01/09/17  Intake/Output from previous day: NG output about 1800 cc since it was put in; bilious Intake/Output this shift: No intake/output data recorded.  PE: General- In NAD.  Awake and alert. Abdomen-soft, not tender, reducible incisional hernias  Lab Results:   Recent Labs  01/11/17 1954 01/13/17 0708  WBC 13.0* 8.7  HGB 12.6* 11.8*  HCT 37.1* 34.2*  PLT 142* 121*   BMET  Recent Labs  01/11/17 1954 01/13/17 0708  NA 140 142  K 4.6 4.4  CL 104 112*  CO2 25 18*  GLUCOSE 156* 81  BUN 22* 27*  CREATININE 1.65* 1.79*  CALCIUM 9.3 8.5*   PT/INR No results for input(s): LABPROT, INR in the last 72 hours. Comprehensive Metabolic Panel:    Component Value Date/Time   NA 142 01/13/2017 0708   NA 140 01/11/2017 1954   NA 141 11/08/2016 1100   K 4.4 01/13/2017 0708   K 4.6 01/11/2017 1954   CL 112 (H) 01/13/2017 0708   CL 104 01/11/2017 1954   CO2 18 (L) 01/13/2017 0708   CO2 25 01/11/2017 1954   BUN 27 (H) 01/13/2017 0708   BUN 22 (H) 01/11/2017 1954   BUN 19 11/08/2016 1100   CREATININE 1.79 (H) 01/13/2017 0708   CREATININE 1.65 (H) 01/11/2017 1954   CREATININE 1.58 (H) 07/30/2016 1059   GLUCOSE 81 01/13/2017 0708   GLUCOSE 156 (H) 01/11/2017 1954   CALCIUM 8.5 (L) 01/13/2017 0708   CALCIUM 9.3 01/11/2017 1954   AST 25 01/11/2017 1954    AST 20 11/02/2016 1638   ALT 9 (L) 01/11/2017 1954   ALT 8 (L) 11/02/2016 1638   ALKPHOS 79 01/11/2017 1954   ALKPHOS 67 11/02/2016 1638   BILITOT 1.1 01/11/2017 1954   BILITOT 0.6 11/02/2016 1638   PROT 8.1 01/11/2017 1954   PROT 6.6 11/02/2016 1638   ALBUMIN 4.6 01/11/2017 1954   ALBUMIN 3.7 11/02/2016 1638     Studies/Results: Dg Abdomen 1 View  Result Date: 01/12/2017 CLINICAL DATA:  Nasogastric tube placement EXAM: ABDOMEN - 1 VIEW COMPARISON:  CT abdomen pelvis 01/12/2017 FINDINGS: A nasogastric tube is coiled in the hiatal hernia, just above the diaphragm. Diffusely dilated small bowel throughout the abdomen, mostly gas-filled. IMPRESSION: NG tube coiled in the hiatal hernia. Electronically Signed   By: Ulyses Jarred M.D.   On: 01/12/2017 04:23   Ct Abdomen Pelvis W Contrast  Result Date: 01/12/2017 CLINICAL DATA:  Generalized abdominal pain x2 days with emesis. EXAM: CT ABDOMEN AND PELVIS WITH CONTRAST TECHNIQUE: Multidetector CT imaging of the abdomen and pelvis was performed using the standard protocol following bolus administration of intravenous contrast. CONTRAST:  2mL ISOVUE-300 IOPAMIDOL (ISOVUE-300) INJECTION 61% COMPARISON:  08/17/2013, 08/07/2012 CT CT FINDINGS: Lower chest: Cardiomegaly with small pericardial effusion or thickening, stable in  appearance. Moderate-sized hiatal hernia, stable in appearance. Right-sided pacer wires are partially imaged. Small fat containing right Bochdalek hernia. Hepatobiliary: Status post cholecystectomy. No biliary dilatation. Stable 18 mm hypodensity in the right hepatic lobe adjacent to the gallbladder fossa may reflect chronic minimal fatty infiltration. This appears stable since 2014 comparison. Pancreas: Atrophic without ductal dilatation. Spleen: Metallic clips are again seen along the medial margin of the normal size spleen. No splenic mass. Adrenals/Urinary Tract: Normal bilateral adrenal glands. Mild renal cortical thinning right  worse than left with tiny too small to characterize cysts bilaterally. No obstructive uropathy. The urinary bladder is physiologically distended. Stomach/Bowel: Physiologic distention of the stomach with previously mentioned moderate-sized hiatal hernia. Normal small bowel rotation. Dilated fluid-filled loops of jejunum up to 3.9 cm in caliber are noted with two supraumbilical ventral hernias containing small bowel medially and large bowel laterally with a short segment of herniated small bowel to the right of the umbilicus. These however do not appear to be the source of bowel obstruction. There is enhancing mucosa with luminal narrowing involving small bowel loops in the right lower quadrant, series 2, image 70 just beyond small bowel anastomotic sutures in the right hemipelvis as well as on series 2 image 41. These may represent inflammatory strictures. Mid jejunal transmural thickening is also seen in the left hemiabdomen, series 5, image 73. The distal and terminal ileum appear decompressed. The colon is physiologically distended without inflammatory change. Vascular/Lymphatic: Mild aortoiliac atherosclerosis without aneurysm or dissection. No lymphadenopathy. Reproductive: Prostate is unremarkable. Other: Smaller free fluid is seen in the pelvis. Musculoskeletal: Degenerative disc disease L4-5. No aggressive nor acute osseous abnormality. IMPRESSION: 1. Small-bowel obstruction believed secondary to inflammatory strictures in the right hemipelvis given areas of luminal narrowing and mucosal enhancement seen. Additionally there is transmural thickening of mid jejunal loops in the left hemiabdomen without stricture consistent with small bowel inflammation. Small amount of free fluid in the pelvis. 2. Cardiomegaly with moderate-sized hiatal hernia appears stable. 3. Small right-sided fat containing Bochdalek hernia. 4. Three chronic right-sided ventral hernias, 2 of which are supraumbilical and 1 periumbilical  containing small and large bowel loops as above described without causing bowel obstruction. 5. Stable 18 mm hypodensity in the right hepatic lobe which may represent focal fatty infiltration. 6. Aortoiliac atherosclerosis. 7. Degenerative disc disease L4-5. Electronically Signed   By: Ashley Royalty M.D.   On: 01/12/2017 02:39   Dg Loyce Dys Tube Plc W/fl W/rad  Result Date: 01/12/2017 CLINICAL DATA:  83 year old with small bowel obstruction and needs decompression. Difficulty placing nasogastric tube due to hiatal hernia. EXAM: NASO G TUBE PLACEMENT WITH FL AND WITH RAD CONTRAST:  30 mL Isovue-300 FLUOROSCOPY TIME:  Fluoroscopy Time:  4 minutes and 12 seconds Radiation Exposure Index (if provided by the fluoroscopic device): 56.2 mGy COMPARISON:  Abdominal radiograph 01/12/2017 FINDINGS: Bilateral nostrils were anesthetized with viscous lidocaine. Nasogastric tube was advanced down the left nostril without difficulty. Tube was advanced into the distal esophagus. In the lower chest, the tube was coiling in the hiatal hernia. Small amount of contrast was injected to identified the stomach anatomy. Most of the contrast refluxed into the esophagus. This contrast was mostly aspirated by the end of the procedure. The nasogastric tube was successfully advanced into the stomach body and antrum using a stiff Amplatz wire. Tube was secured to the patient's nose. Fluoroscopic images were taken and saved for this procedure. IMPRESSION: Successful placement of nasogastric tube with fluoroscopic guidance. Electronically Signed  By: Markus Daft M.D.   On: 01/12/2017 11:37    Anti-infectives: Anti-infectives    None       Gertrue Willette J 01/13/2017

## 2017-01-13 NOTE — Progress Notes (Signed)
Initial Nutrition Assessment  DOCUMENTATION CODES:   Non-severe (moderate) malnutrition in context of chronic illness  INTERVENTION:  - Diet advancement as medically feasible. - RD will provide interventions as warranted with diet advancement.   NUTRITION DIAGNOSIS:   Malnutrition (moderate/non-severe) related to poor appetite, chronic illness (multiple abdominal surgeries) as evidenced by moderate depletion of body fat, moderate depletions of muscle mass.  GOAL:   Patient will meet greater than or equal to 90% of their needs  MONITOR:   Diet advancement, Weight trends, Labs, I & O's  REASON FOR ASSESSMENT:   Malnutrition Screening Tool  ASSESSMENT:   83 y.o. male with medical history significant for multiple abdominal surgeries including hiatal hernia repair, open cholecystectomy, colon surgery, exploratory laparotomy, hemorrhoid surgery, hernia repair, small intestinal surgery, history of small bowel obstruction treated conservatively admitted today with complaints of nausea vomiting abdominal distention and abdominal pain. His last bowel movement was 2 days prior to admission to the hospital. He feels this is similar to the small bowel obstruction he had in the past. CT scan done in the emergency room showed small bowel obstruction secondary to inflammatory strictures in the right hemipelvis given areas of luminal narrowing and mucosal enhancement. Transmural thickening of mid incisional loops in the left hemiabdomen without stricture consistent with small bowel inflammation. And small amount of free fluid in the pelvis he has 3 chronic right-sided ventral hernias.  Pt seen for MST. BMI indicates normal weight. Pt has been NPO since admission. NGT placed in the ED 2/2 SBO. Approximately 200cc yellow-brown drainage present at time of RD visit a few minutes ago. Flow sheet shows that pt put out 900cc following NGT placement. No plan for surgical intervention at this time. Pt reports  that he ate a good dinner on Friday (8/24) and does not recall having anything to eat or drink after that time. His appetite has been decreased x3-4 months with no known reason for this change. He denies swallowing difficulties but does need softer foods to make chewing more manageable.   He weighs himself at home and reports weight 3-4 months ago was 160-170 lbs and that around the Fourth of July he weighed 150 lbs. Chart review indicates that pt weighed 151 lbs on 10/28/16 and that he was 161 lbs on 07/30/16. Weight loss of 4 lbs (2.6% body weight) in two months is not significant but weight loss of 14 lbs (9% body weight) in 5 months is significant. Physical assessment shows moderate muscle and moderate fat wasting, mild edema to bilateral ankles.   Medications reviewed; sliding scale Novolog.  Labs reviewed; Cl; 112 mmol/L, BUN: 27 mg/dL, creatinine: 1.79 mg/dL, Ca: 8.5 mg/dL, GFR: 37 mL/min.  IVF: D5-1/2 NS-10 mEq KCl @ 75 mL/hr (306 kcal from dextrose).   Diet Order:  Diet NPO time specified  Skin:  Reviewed, no issues  Last BM:  8/23 (per pt)  Height:   Ht Readings from Last 1 Encounters:  01/12/17 5\' 11"  (1.803 m)    Weight:   Wt Readings from Last 1 Encounters:  01/12/17 147 lb 14.9 oz (67.1 kg)    Ideal Body Weight:  78.18 kg  BMI:  Body mass index is 20.63 kg/m.  Estimated Nutritional Needs:   Kcal:  1545-1745 (23-26 kcal/kg)  Protein:  60-70 grams  Fluid:  >/= 1.8 L/day  EDUCATION NEEDS:   No education needs identified at this time    Jarome Matin, MS, RD, LDN, [place] In[facility] Clinical Dietitian Pager # [phone] After  hours/weekend pager # [phone]

## 2017-01-13 NOTE — Care Management Note (Signed)
Case Management Note  Patient Details  Name: [name] MRN: [id number] Date of Birth: 11-03-1923  Subjective/Objective:                  SBO  Action/Plan: Date:  January 13, 2017 Chart reviewed for concurrent status and case management needs. Will continue to follow patient progress. Discharge Planning: following for needs Expected discharge date: [id number] Velva Harman, BSN, [place], [place]  Expected Discharge Date:                  Expected Discharge Plan:  Home/Self Care  In-House Referral:     Discharge planning Services  CM Consult  Post Acute Care Choice:    Choice offered to:     DME Arranged:    DME Agency:     HH Arranged:    HH Agency:     Status of Service:  In process, will continue to follow  If discussed at Long Length of Stay Meetings, dates discussed:    Additional Comments:  Matthew Cha, RN 01/13/2017, 8:30 AM

## 2017-01-13 NOTE — Progress Notes (Signed)
PROGRESS NOTE  [name]  DJM:[id number] DOB: April 01, 1924 DOA: 01/12/2017 PCP: Foye Spurling, MD  Brief Narrative:   Patient is a 83 year old male with history of multiple abdominal surgeries including hiatal hernia repair, open cystectomy, colon surgery, exploratory laparotomy, hemorrhoid surgery, hernia repair, small intestinal surgery, and recurrent small bowel obstructions who presented with nausea, vomiting, abdominal distention, and abdominal pain. 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 The patient had an NG tube placed and general surgery was consulted. He has had marked improvement in his abdominal distention and abdominal pain. He no longer has nausea in the past and flatness this morning.  KUB pending.    Assessment & Plan:   Active Problems:   SBO (small bowel obstruction) ([name])   SOB (shortness of breath)   Small bowel obstruction ([name])   Malnutrition of moderate degree \ Small bowel obstruction, appears to be resolving. The patient has bowel sounds present this morning, soft abdomen and is passing flatness. -  Follow-up KUB -  Continue NG to LIS -  Change to dextrose containing IV fluids -  Repeat KUB in a.m. -  Ambulate the halls  Atrial fibrillation and history of tachycardia/bradycardia syndrome, appears to be persistent on telemetry, rate in 80s to 90s. Patient had her episode of V. tach in the emergency department while an NG tube was being inserted.  -  Oral anticoagulation held -  Therapeutic Lovenox -  Continue IV metoprolol -  Pacemaker interrogated today: Appears to be functioning properly  Diabetes mellitus type 2 -  Hemoglobin A1c- -  start sliding scale insulin  CK D stage III, baseline hemoglobin 1.4-1.5, creatinine rising slightly -  Increase IV fluids  Moderate protein calorie malnutrition -  Advance diet as quickly as possible -  We will provide supplements once diet  advanced  DVT prophylaxis:  Therapeutic Lovenox Code Status:  Full code Family Communication:  Patient alone Disposition Plan:  Transfer to telemetry bed   Consultants:   General surgery  Procedures:  CT of the abdomen and pelvis NG inserted on 8/26 by interventional radiology  Antimicrobials:  Anti-infectives    None       Subjective:  Feeling better this morning. Abdominal distention and discomfort are improved. He is passing flatness. Denies nausea. Has had 2 L of NG output over the last 24 hours  Objective: Vitals:   01/13/17 0600 01/13/17 0700 01/13/17 0800 01/13/17 1200  BP: 112/61 101/63 (!) 118/52 122/64  Pulse: 92 71 89 84  Resp: 14 (!) 22 16   Temp:   (!) 97.2 F (36.2 C) 98.3 F (36.8 C)  TempSrc:   Oral Oral  SpO2: 98% 99% 98% 100%  Weight:      Height:        Intake/Output Summary (Last 24 hours) at 01/13/17 1436 Last data filed at 01/13/17 0700  Gross per 24 hour  Intake             1760 ml  Output             1480 ml  Net              280 ml   Filed Weights   01/12/17 0900  Weight: 67.1 kg (147 lb 14.9 oz)    Examination:  General exam:  Adult Male.  No acute distress.  HEENT:  NCAT, MMM Respiratory system: Clear to auscultation bilaterally Cardiovascular system: IRRR, normal S1/S2. No murmurs,  rubs, gallops or clicks.  Warm extremities Gastrointestinal system: Bowel sounds present, soft, mildly distended, nontender, hernia is reducible  MSK:  Normal tone and bulk, no lower extremity edema Neuro:  Grossly intact    Data Reviewed: I have personally reviewed following labs and imaging studies  CBC:  Recent Labs Lab 01/11/17 1954 01/13/17 0708  WBC 13.0* 8.7  HGB 12.6* 11.8*  HCT 37.1* 34.2*  MCV 74.9* 75.0*  PLT 142* 341*   Basic Metabolic Panel:  Recent Labs Lab 01/11/17 1954 01/13/17 0708  NA 140 142  K 4.6 4.4  CL 104 112*  CO2 25 18*  GLUCOSE 156* 81  BUN 22* 27*  CREATININE 1.65* 1.79*  CALCIUM 9.3 8.5*    GFR: Estimated Creatinine Clearance: 26 mL/min (A) (by C-G formula based on SCr of 1.79 mg/dL (H)). Liver Function Tests:  Recent Labs Lab 01/11/17 1954  AST 25  ALT 9*  ALKPHOS 79  BILITOT 1.1  PROT 8.1  ALBUMIN 4.6    Recent Labs Lab 01/11/17 1954  LIPASE 32   No results for input(s): AMMONIA in the last 168 hours. Coagulation Profile: No results for input(s): INR, PROTIME in the last 168 hours. Cardiac Enzymes: No results for input(s): CKTOTAL, CKMB, CKMBINDEX, TROPONINI in the last 168 hours. BNP (last 3 results) No results for input(s): PROBNP in the last 8760 hours. HbA1C:  Recent Labs  01/13/17 0708  HGBA1C 6.7*   CBG:  Recent Labs Lab 01/13/17 0811 01/13/17 1126  GLUCAP 71 97   Lipid Profile: No results for input(s): CHOL, HDL, LDLCALC, TRIG, CHOLHDL, LDLDIRECT in the last 72 hours. Thyroid Function Tests: No results for input(s): TSH, T4TOTAL, FREET4, T3FREE, THYROIDAB in the last 72 hours. Anemia Panel: No results for input(s): VITAMINB12, FOLATE, FERRITIN, TIBC, IRON, RETICCTPCT in the last 72 hours. Urine analysis:    Component Value Date/Time   COLORURINE YELLOW 01/11/2017 [place] 01/11/2017 2352   LABSPEC 1.020 01/11/2017 2352   PHURINE 6.0 01/11/2017 2352   GLUCOSEU NEGATIVE 01/11/2017 2352   HGBUR NEGATIVE 01/11/2017 2352   BILIRUBINUR NEGATIVE 01/11/2017 2352   KETONESUR TRACE (A) 01/11/2017 2352   PROTEINUR NEGATIVE 01/11/2017 2352   UROBILINOGEN 0.2 08/17/2013 0945   NITRITE NEGATIVE 01/11/2017 2352   LEUKOCYTESUR NEGATIVE 01/11/2017 2352   Sepsis Labs: @LABRCNTIP (procalcitonin:4,lacticidven:4)  ) Recent Results (from the past 240 hour(s))  MRSA PCR Screening     Status: None   Collection Time: 01/12/17 10:33 AM  Result Value Ref Range Status   MRSA by PCR NEGATIVE NEGATIVE Final    Comment:        The GeneXpert MRSA Assay (FDA approved for NASAL specimens only), is one component of a comprehensive  MRSA colonization surveillance program. It is not intended to diagnose MRSA infection nor to guide or monitor treatment for MRSA infections.       Radiology Studies: Dg Abdomen 1 View  Result Date: 01/12/2017 CLINICAL DATA:  Nasogastric tube placement EXAM: ABDOMEN - 1 VIEW COMPARISON:  CT abdomen pelvis 01/12/2017 FINDINGS: A nasogastric tube is coiled in the hiatal hernia, just above the diaphragm. Diffusely dilated small bowel throughout the abdomen, mostly gas-filled. IMPRESSION: NG tube coiled in the hiatal hernia. Electronically Signed   By: Ulyses Jarred M.D.   On: 01/12/2017 04:23   Ct Abdomen Pelvis W Contrast  Result Date: 01/12/2017 CLINICAL DATA:  Generalized abdominal pain x2 days with emesis. EXAM: CT ABDOMEN AND PELVIS WITH CONTRAST TECHNIQUE: Multidetector CT imaging of the  abdomen and pelvis was performed using the standard protocol following bolus administration of intravenous contrast. CONTRAST:  64mL ISOVUE-300 IOPAMIDOL (ISOVUE-300) INJECTION 61% COMPARISON:  08/17/2013, 08/07/2012 CT CT FINDINGS: Lower chest: Cardiomegaly with small pericardial effusion or thickening, stable in appearance. Moderate-sized hiatal hernia, stable in appearance. Right-sided pacer wires are partially imaged. Small fat containing right Bochdalek hernia. Hepatobiliary: Status post cholecystectomy. No biliary dilatation. Stable 18 mm hypodensity in the right hepatic lobe adjacent to the gallbladder fossa may reflect chronic minimal fatty infiltration. This appears stable since 2014 comparison. Pancreas: Atrophic without ductal dilatation. Spleen: Metallic clips are again seen along the medial margin of the normal size spleen. No splenic mass. Adrenals/Urinary Tract: Normal bilateral adrenal glands. Mild renal cortical thinning right worse than left with tiny too small to characterize cysts bilaterally. No obstructive uropathy. The urinary bladder is physiologically distended. Stomach/Bowel: Physiologic  distention of the stomach with previously mentioned moderate-sized hiatal hernia. Normal small bowel rotation. Dilated fluid-filled loops of jejunum up to 3.9 cm in caliber are noted with two supraumbilical ventral hernias containing small bowel medially and large bowel laterally with a Alda Gaultney segment of herniated small bowel to the right of the umbilicus. These however do not appear to be the source of bowel obstruction. There is enhancing mucosa with luminal narrowing involving small bowel loops in the right lower quadrant, series 2, image 70 just beyond small bowel anastomotic sutures in the right hemipelvis as well as on series 2 image 41. These may represent inflammatory strictures. Mid jejunal transmural thickening is also seen in the left hemiabdomen, series 5, image 73. The distal and terminal ileum appear decompressed. The colon is physiologically distended without inflammatory change. Vascular/Lymphatic: Mild aortoiliac atherosclerosis without aneurysm or dissection. No lymphadenopathy. Reproductive: Prostate is unremarkable. Other: Smaller free fluid is seen in the pelvis. Musculoskeletal: Degenerative disc disease L4-5. No aggressive nor acute osseous abnormality. IMPRESSION: 1. Small-bowel obstruction believed secondary to inflammatory strictures in the right hemipelvis given areas of luminal narrowing and mucosal enhancement seen. Additionally there is transmural thickening of mid jejunal loops in the left hemiabdomen without stricture consistent with small bowel inflammation. Small amount of free fluid in the pelvis. 2. Cardiomegaly with moderate-sized hiatal hernia appears stable. 3. Small right-sided fat containing Bochdalek hernia. 4. Three chronic right-sided ventral hernias, 2 of which are supraumbilical and 1 periumbilical containing small and large bowel loops as above described without causing bowel obstruction. 5. Stable 18 mm hypodensity in the right hepatic lobe which may represent focal  fatty infiltration. 6. Aortoiliac atherosclerosis. 7. Degenerative disc disease L4-5. Electronically Signed   By: Ashley Royalty M.D.   On: 01/12/2017 02:39   Dg Loyce Dys Tube Plc W/fl W/rad  Result Date: 01/12/2017 CLINICAL DATA:  83 year old with small bowel obstruction and needs decompression. Difficulty placing nasogastric tube due to hiatal hernia. EXAM: NASO G TUBE PLACEMENT WITH FL AND WITH RAD CONTRAST:  30 mL Isovue-300 FLUOROSCOPY TIME:  Fluoroscopy Time:  4 minutes and 12 seconds Radiation Exposure Index (if provided by the fluoroscopic device): 56.2 mGy COMPARISON:  Abdominal radiograph 01/12/2017 FINDINGS: Bilateral nostrils were anesthetized with viscous lidocaine. Nasogastric tube was advanced down the left nostril without difficulty. Tube was advanced into the distal esophagus. In the lower chest, the tube was coiling in the hiatal hernia. Small amount of contrast was injected to identified the stomach anatomy. Most of the contrast refluxed into the esophagus. This contrast was mostly aspirated by the end of the procedure. The nasogastric tube was successfully  advanced into the stomach body and antrum using a stiff Amplatz wire. Tube was secured to the patient's nose. Fluoroscopic images were taken and saved for this procedure. IMPRESSION: Successful placement of nasogastric tube with fluoroscopic guidance. Electronically Signed   By: Markus Daft M.D.   On: 01/12/2017 11:37     Scheduled Meds: . enoxaparin (LOVENOX) injection  1 mg/kg Subcutaneous Q24H  . insulin aspart  0-5 Units Subcutaneous Q4H  . metoprolol tartrate  2.5 mg Intravenous Q8H   Continuous Infusions: . dextrose 5 % and 0.45 % NaCl with KCl 10 mEq/L 75 mL/hr at 01/13/17 0943     LOS: 1 day    Time spent: 30 min    Janece Canterbury, MD [facility] Pager [phone]  If 7PM-7AM, please contact night-coverage [email or web address] Password [facility] 01/13/2017, 2:36 PM

## 2017-01-14 ENCOUNTER — Inpatient Hospital Stay (HOSPITAL_COMMUNITY): Payer: Medicare Other

## 2017-01-14 DIAGNOSIS — I472 Ventricular tachycardia, unspecified: Secondary | ICD-10-CM

## 2017-01-14 DIAGNOSIS — E440 Moderate protein-calorie malnutrition: Secondary | ICD-10-CM

## 2017-01-14 LAB — CBC
HCT: 29.7 % — ABNORMAL LOW (ref 39.0–52.0)
HEMOGLOBIN: 10.1 g/dL — AB (ref 13.0–17.0)
MCH: 25.2 pg — ABNORMAL LOW (ref 26.0–34.0)
MCHC: 34 g/dL (ref 30.0–36.0)
MCV: 74.1 fL — ABNORMAL LOW (ref 78.0–100.0)
PLATELETS: 111 10*3/uL — AB (ref 150–400)
RBC: 4.01 MIL/uL — AB (ref 4.22–5.81)
RDW: 18.2 % — ABNORMAL HIGH (ref 11.5–15.5)
WBC: 4.3 10*3/uL (ref 4.0–10.5)

## 2017-01-14 LAB — BASIC METABOLIC PANEL
ANION GAP: 7 (ref 5–15)
BUN: 28 mg/dL — ABNORMAL HIGH (ref 6–20)
CALCIUM: 8.2 mg/dL — AB (ref 8.9–10.3)
CHLORIDE: 113 mmol/L — AB (ref 101–111)
CO2: 21 mmol/L — ABNORMAL LOW (ref 22–32)
CREATININE: 1.83 mg/dL — AB (ref 0.61–1.24)
GFR calc non Af Amer: 31 mL/min — ABNORMAL LOW (ref >60)
GFR, EST AFRICAN AMERICAN: 36 mL/min — AB (ref >60)
Glucose, Bld: 150 mg/dL — ABNORMAL HIGH (ref 65–99)
Potassium: 3.9 mmol/L (ref 3.5–5.1)
SODIUM: 141 mmol/L (ref 135–145)

## 2017-01-14 LAB — GLUCOSE, CAPILLARY
GLUCOSE-CAPILLARY: 136 mg/dL — AB (ref 65–99)
GLUCOSE-CAPILLARY: 136 mg/dL — AB (ref 65–99)
GLUCOSE-CAPILLARY: 148 mg/dL — AB (ref 65–99)
Glucose-Capillary: 158 mg/dL — ABNORMAL HIGH (ref 65–99)
Glucose-Capillary: 112 mg/dL — ABNORMAL HIGH (ref 65–99)

## 2017-01-14 MED ORDER — METOPROLOL TARTRATE 5 MG/5ML IV SOLN
2.5000 mg | Freq: Four times a day (QID) | INTRAVENOUS | Status: DC
  Administered 2017-01-14 – 2017-01-16 (×6): 2.5 mg via INTRAVENOUS
  Filled 2017-01-14 (×7): qty 5

## 2017-01-14 MED ORDER — DIATRIZOATE MEGLUMINE & SODIUM 66-10 % PO SOLN
90.0000 mL | Freq: Once | ORAL | Status: AC
  Administered 2017-01-14: 90 mL via NASOGASTRIC
  Filled 2017-01-14: qty 90

## 2017-01-14 NOTE — Progress Notes (Signed)
RN notified by Central telemetry that patient had a 8 beat run of vtach. Patient in no acute distress. MD notified. Will continue to monitor.

## 2017-01-14 NOTE — Progress Notes (Signed)
Patient ID: [name], male   DOB: [birth year], 83 y.o.   MRN: [id number]  [facility] Surgery Progress Note     Subjective: CC- SBO Patient states that he feels much better this morning. Denies any abdominal pain or bloating. He is passing a small amount of flatus, no BM. Has not been out of bed since admission.  Patient lives at home alone but has several family members who check on him regularly. He does not use a cane or walker for ambulation.  Objective: Vital signs in last 24 hours: Temp:  [98.1 F (36.7 C)-99.1 F (37.3 C)] 98.6 F (37 C) (08/28 0424) Pulse Rate:  [80-105] 87 (08/28 0424) Resp:  [13-22] 18 (08/28 0424) BP: (110-138)/(50-68) 110/67 (08/28 0424) SpO2:  [99 %-100 %] 100 % (08/28 0424) Last BM Date: 01/09/17  Intake/Output from previous day: 08/27 0701 - 08/28 0700 In: 2021.3 [I.V.:1521.3; NG/GT:500] Out: 1200 [Urine:400; Emesis/NG output:800] Intake/Output this shift: No intake/output data recorded.  PE: Gen:  Alert, NAD, pleasant HEENT: EOM's intact, pupils equal and round Card:  RRR, no M/G/R heard Pulm:  CTAB, no W/R/R, effort normal Abd: well healed midline scar, soft, mild distension, nontender, +BS, no HSM, soft palpable hernia on the right Ext:  No erythema, edema, or tenderness BUE/BLE  Psych: A&Ox3  Skin: no rashes noted, warm and dry  Lab Results:   Recent Labs  01/13/17 1636 01/14/17 0536  WBC 7.3 4.3  HGB 11.2* 10.1*  HCT 33.7* 29.7*  PLT 112* 111*   BMET  Recent Labs  01/13/17 0708 01/14/17 0536  NA 142 141  K 4.4 3.9  CL 112* 113*  CO2 18* 21*  GLUCOSE 81 150*  BUN 27* 28*  CREATININE 1.79* 1.83*  CALCIUM 8.5* 8.2*   PT/INR No results for input(s): LABPROT, INR in the last 72 hours. CMP     Component Value Date/Time   NA 141 01/14/2017 0536   NA 141 11/08/2016 1100   K 3.9 01/14/2017 0536   CL 113 (H) 01/14/2017 0536   CO2 21 (L) 01/14/2017 0536   GLUCOSE 150 (H) 01/14/2017 0536   BUN 28 (H)  01/14/2017 0536   BUN 19 11/08/2016 1100   CREATININE 1.83 (H) 01/14/2017 0536   CREATININE 1.58 (H) 07/30/2016 1059   CALCIUM 8.2 (L) 01/14/2017 0536   PROT 8.1 01/11/2017 1954   ALBUMIN 4.6 01/11/2017 1954   AST 25 01/11/2017 1954   ALT 9 (L) 01/11/2017 1954   ALKPHOS 79 01/11/2017 1954   BILITOT 1.1 01/11/2017 1954   GFRNONAA 31 (L) 01/14/2017 0536   GFRAA 36 (L) 01/14/2017 0536   Lipase     Component Value Date/Time   LIPASE 32 01/11/2017 1954       Studies/Results: Dg Abd Portable 1v  Result Date: 01/14/2017 CLINICAL DATA:  Small bowel obstruction, follow-up. EXAM: PORTABLE ABDOMEN - 1 VIEW COMPARISON:  A supine abdominal radiograph of January 13, 2017 FINDINGS: There are loops of moderately distended gas-filled small bowel in the epigastrium and left mid abdomen. There is some gas in stool in normal caliber colon. No extraluminal gas collections are observed. The esophagogastric tube tip in proximal port project in the distal gastric body and are stable. There are degenerative changes of the lower lumbar spine. There surgical clips in the gallbladder fossa and GE junction region. IMPRESSION: Persistent mid small bowel obstruction.  No evidence of perforation. Electronically Signed   By: David  Martinique M.D.   On: 01/14/2017 07:22  Dg Abd Portable 1v  Result Date: 01/13/2017 CLINICAL DATA:  Recent NG tube adjustment. EXAM: PORTABLE ABDOMEN - 1 VIEW COMPARISON:  Abdominal x-ray from same day. FINDINGS: Interval advancement of the NG tube, now with the tip and distal side port in the gastric antrum. Unchanged small bowel dilatation. IMPRESSION: Interval advancement of the NG tube, now in good position in the gastric antrum. Electronically Signed   By: Titus Dubin M.D.   On: 01/13/2017 16:59   Dg Abd Portable 1v  Result Date: 01/13/2017 CLINICAL DATA:  NG tube placement. EXAM: PORTABLE ABDOMEN - 1 VIEW COMPARISON:  01/12/2017 FINDINGS: The NG tube tip is near the GE junction but  needs to be advanced several cm. Persistent but slightly improved small bowel dilatation. No definite free air. IMPRESSION: The NG tube tip is near the GE junction but needs to be advanced several cm into the stomach. Persistent but slightly improved small bowel dilatation. Electronically Signed   By: Marijo Sanes M.D.   On: 01/13/2017 16:37   Dg Addison Bailey G Tube Plc W/fl W/rad  Result Date: 01/12/2017 CLINICAL DATA:  83 year old with small bowel obstruction and needs decompression. Difficulty placing nasogastric tube due to hiatal hernia. EXAM: NASO G TUBE PLACEMENT WITH FL AND WITH RAD CONTRAST:  30 mL Isovue-300 FLUOROSCOPY TIME:  Fluoroscopy Time:  4 minutes and 12 seconds Radiation Exposure Index (if provided by the fluoroscopic device): 56.2 mGy COMPARISON:  Abdominal radiograph 01/12/2017 FINDINGS: Bilateral nostrils were anesthetized with viscous lidocaine. Nasogastric tube was advanced down the left nostril without difficulty. Tube was advanced into the distal esophagus. In the lower chest, the tube was coiling in the hiatal hernia. Small amount of contrast was injected to identified the stomach anatomy. Most of the contrast refluxed into the esophagus. This contrast was mostly aspirated by the end of the procedure. The nasogastric tube was successfully advanced into the stomach body and antrum using a stiff Amplatz wire. Tube was secured to the patient's nose. Fluoroscopic images were taken and saved for this procedure. IMPRESSION: Successful placement of nasogastric tube with fluoroscopic guidance. Electronically Signed   By: Markus Daft M.D.   On: 01/12/2017 11:37    Anti-infectives: Anti-infectives    None       Assessment/Plan Atrial fibrillation on xarelto at home - hold xarelto H/o tachycardia/bradycardia syndrome DM CKD-III Pacemaker  SBO - prior h/o multiple abdominal surgeries - CT scan shows small-bowel obstruction believed secondary to inflammatory strictures in the right  hemipelvis, moderate sized hiatal hernia, Bochdalek hernia, 3 chronic right-sided ventral hernias - NG tube with 800cc/24hr - +flatus  ID - none FEN - IVF, NPO/NGT VTE - SCDs, ok for lovenox or heparin from our standpoint Foley - none Follow up - TBD  Plan - Will start small bowel protocol today with gastrograffin administration followed by delayed abdominal XR. Ambulate in halls.   LOS: 2 days    Wellington [place] , [facility] Surgery 01/14/2017, 9:10 AM Pager: [phone] Consults: [phone] Mon-Fri 7:00 am-4:30 pm Sat-Sun 7:00 am-11:30 am

## 2017-01-14 NOTE — Progress Notes (Signed)
PROGRESS NOTE  [name]  SWN:[id number] DOB: 03-19-1924 DOA: 01/12/2017 PCP: Foye Spurling, MD  Brief Narrative:   Patient is a 83 year old male with history of multiple abdominal surgeries including hiatal hernia repair, open cystectomy, colon surgery, exploratory laparotomy, hemorrhoid surgery, hernia repair, small intestinal surgery, and recurrent small bowel obstructions who presented with nausea, vomiting, abdominal distention, and abdominal pain. 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 The patient had an NG tube placed and general surgery was consulted. He has had marked improvement in his abdominal distention and abdominal pain. He no longer has nausea in the past and flatness this morning.  KUB pending.    Assessment & Plan:   Active Problems:   SBO (small bowel obstruction) ([name])   SOB (shortness of breath)   Small bowel obstruction ([name])   Malnutrition of moderate degree  Small bowel obstruction, appears to be resolving. The patient has bowel sounds present this morning, soft abdomen and is passing flatness. -  KUB:  Persistent mid-small bowel obstruction -  Continue NG to LIS -  Continue dextrose containing IV fluids -  Appreciate surgery assistance -  Ambulate the halls  Hemorrhage into NG, resolving -  Hemoglobin trended down by about a point, but I think this was hemodilutional -  Continue to hold anticoagulation today -  Continue PPI -  NG appears well-positioned on AM XR  Atrial fibrillation and history of tachycardia/bradycardia syndrome, appears to be persistent on telemetry, rate in 80s to 90s.  Having intermittent V. tach  -  Oral anticoagulation held -  Holding Lovenox due to blood coming from NG tube on 8/27 (tube was mal-positioned) -  Resume IV metoprolol -  Check mag and phos -  Pacemaker interrogated 8/27: Appears to be functioning properly  Diabetes mellitus type 2 -   Hemoglobin A1c -  Continue sliding scale insulin  CK D stage III, baseline hemoglobin 1.4-1.5, creatinine rising slightly -  Increase IV fluids -  Consider bladder scan if rising more again tomorrow  Moderate protein calorie malnutrition -  Advance diet as quickly as possible -  We will provide supplements once diet advanced  DVT prophylaxis:  SCDs Code Status:  Full code Family Communication:  Patient alone Disposition Plan:  Awaiting improvement in SBO.     Consultants:   General surgery  Procedures:  CT of the abdomen and pelvis NG inserted on 8/26 by interventional radiology  Antimicrobials:  Anti-infectives    None       Subjective:  Feels well, but bloated feeling/discomfort persists in the LLQ.  Denies nausea.  Still passing flatus and had one very small BM this morning.    Objective: Vitals:   01/13/17 2200 01/13/17 2311 01/14/17 0424 01/14/17 1313  BP: 138/65 134/66 110/67 122/62  Pulse: 87 (!) 105 87 89  Resp: (!) 22 20 18 18   Temp:  98.1 F (36.7 C) 98.6 F (37 C) 97.9 F (36.6 C)  TempSrc:  Oral Oral Oral  SpO2: 100% 99% 100% 100%  Weight:      Height:        Intake/Output Summary (Last 24 hours) at 01/14/17 2008 Last data filed at 01/14/17 1700  Gross per 24 hour  Intake             2550 ml  Output             1650 ml  Net  900 ml   Filed Weights   01/12/17 0900  Weight: 67.1 kg (147 lb 14.9 oz)    Examination:  General exam:  Adult male.  No acute distress.  HEENT:  NCAT, MMM Respiratory system: Clear to auscultation bilaterally Cardiovascular system: iRRR normal S1/S2. No murmurs, rubs, gallops or clicks.  Warm extremities Gastrointestinal system:  Positive bowel sounds, soft, mildly distended with palpably enlarged/distended loops in the left lower quadrant that are tender, no rebound or guarding.  Hernia is reducible MSK:  Normal tone and bulk, no lower extremity edema Neuro:  Grossly intact  Data Reviewed: I have  personally reviewed following labs and imaging studies  CBC:  Recent Labs Lab 01/11/17 1954 01/13/17 0708 01/13/17 1636 01/14/17 0536  WBC 13.0* 8.7 7.3 4.3  HGB 12.6* 11.8* 11.2* 10.1*  HCT 37.1* 34.2* 33.7* 29.7*  MCV 74.9* 75.0* 74.7* 74.1*  PLT 142* 121* 112* 979*   Basic Metabolic Panel:  Recent Labs Lab 01/11/17 1954 01/13/17 0708 01/14/17 0536  NA 140 142 141  K 4.6 4.4 3.9  CL 104 112* 113*  CO2 25 18* 21*  GLUCOSE 156* 81 150*  BUN 22* 27* 28*  CREATININE 1.65* 1.79* 1.83*  CALCIUM 9.3 8.5* 8.2*   GFR: Estimated Creatinine Clearance: 25.5 mL/min (A) (by C-G formula based on SCr of 1.83 mg/dL (H)). Liver Function Tests:  Recent Labs Lab 01/11/17 1954  AST 25  ALT 9*  ALKPHOS 79  BILITOT 1.1  PROT 8.1  ALBUMIN 4.6    Recent Labs Lab 01/11/17 1954  LIPASE 32   No results for input(s): AMMONIA in the last 168 hours. Coagulation Profile: No results for input(s): INR, PROTIME in the last 168 hours. Cardiac Enzymes: No results for input(s): CKTOTAL, CKMB, CKMBINDEX, TROPONINI in the last 168 hours. BNP (last 3 results) No results for input(s): PROBNP in the last 8760 hours. HbA1C:  Recent Labs  01/13/17 0708  HGBA1C 6.7*   CBG:  Recent Labs Lab 01/13/17 2331 01/14/17 0427 01/14/17 0734 01/14/17 1131 01/14/17 1636  GLUCAP 117* 136* 158* 136* 148*   Lipid Profile: No results for input(s): CHOL, HDL, LDLCALC, TRIG, CHOLHDL, LDLDIRECT in the last 72 hours. Thyroid Function Tests: No results for input(s): TSH, T4TOTAL, FREET4, T3FREE, THYROIDAB in the last 72 hours. Anemia Panel: No results for input(s): VITAMINB12, FOLATE, FERRITIN, TIBC, IRON, RETICCTPCT in the last 72 hours. Urine analysis:    Component Value Date/Time   COLORURINE YELLOW 01/11/2017 [place] 01/11/2017 2352   LABSPEC 1.020 01/11/2017 2352   PHURINE 6.0 01/11/2017 2352   GLUCOSEU NEGATIVE 01/11/2017 2352   HGBUR NEGATIVE 01/11/2017 2352    BILIRUBINUR NEGATIVE 01/11/2017 2352   KETONESUR TRACE (A) 01/11/2017 2352   PROTEINUR NEGATIVE 01/11/2017 2352   UROBILINOGEN 0.2 08/17/2013 0945   NITRITE NEGATIVE 01/11/2017 2352   LEUKOCYTESUR NEGATIVE 01/11/2017 2352   Sepsis Labs: @LABRCNTIP (procalcitonin:4,lacticidven:4)  ) Recent Results (from the past 240 hour(s))  MRSA PCR Screening     Status: None   Collection Time: 01/12/17 10:33 AM  Result Value Ref Range Status   MRSA by PCR NEGATIVE NEGATIVE Final    Comment:        The GeneXpert MRSA Assay (FDA approved for NASAL specimens only), is one component of a comprehensive MRSA colonization surveillance program. It is not intended to diagnose MRSA infection nor to guide or monitor treatment for MRSA infections.       Radiology Studies: Dg Abd Portable 1v  Result Date:  01/14/2017 CLINICAL DATA:  Small bowel obstruction, follow-up. EXAM: PORTABLE ABDOMEN - 1 VIEW COMPARISON:  A supine abdominal radiograph of January 13, 2017 FINDINGS: There are loops of moderately distended gas-filled small bowel in the epigastrium and left mid abdomen. There is some gas in stool in normal caliber colon. No extraluminal gas collections are observed. The esophagogastric tube tip in proximal port project in the distal gastric body and are stable. There are degenerative changes of the lower lumbar spine. There surgical clips in the gallbladder fossa and GE junction region. IMPRESSION: Persistent mid small bowel obstruction.  No evidence of perforation. Electronically Signed   By: David  Martinique M.D.   On: 01/14/2017 07:22   Dg Abd Portable 1v  Result Date: 01/13/2017 CLINICAL DATA:  Recent NG tube adjustment. EXAM: PORTABLE ABDOMEN - 1 VIEW COMPARISON:  Abdominal x-ray from same day. FINDINGS: Interval advancement of the NG tube, now with the tip and distal side port in the gastric antrum. Unchanged small bowel dilatation. IMPRESSION: Interval advancement of the NG tube, now in good position in  the gastric antrum. Electronically Signed   By: Titus Dubin M.D.   On: 01/13/2017 16:59   Dg Abd Portable 1v  Result Date: 01/13/2017 CLINICAL DATA:  NG tube placement. EXAM: PORTABLE ABDOMEN - 1 VIEW COMPARISON:  01/12/2017 FINDINGS: The NG tube tip is near the GE junction but needs to be advanced several cm. Persistent but slightly improved small bowel dilatation. No definite free air. IMPRESSION: The NG tube tip is near the GE junction but needs to be advanced several cm into the stomach. Persistent but slightly improved small bowel dilatation. Electronically Signed   By: Marijo Sanes M.D.   On: 01/13/2017 16:37     Scheduled Meds: . insulin aspart  0-5 Units Subcutaneous Q4H  . pantoprazole (PROTONIX) IV  40 mg Intravenous BID   Continuous Infusions: . dextrose 5 % and 0.45 % NaCl with KCl 10 mEq/L 125 mL/hr at 01/14/17 0954     LOS: 2 days    Time spent: 30 min    Janece Canterbury, MD [facility] Pager [phone]  If 7PM-7AM, please contact night-coverage [email or web address] Password [email or web address] 01/14/2017, 8:08 PM

## 2017-01-15 DIAGNOSIS — Z978 Presence of other specified devices: Secondary | ICD-10-CM

## 2017-01-15 DIAGNOSIS — K567 Ileus, unspecified: Secondary | ICD-10-CM

## 2017-01-15 LAB — CBC
HEMATOCRIT: 29.8 % — AB (ref 39.0–52.0)
Hemoglobin: 10.1 g/dL — ABNORMAL LOW (ref 13.0–17.0)
MCH: 25.3 pg — AB (ref 26.0–34.0)
MCHC: 33.9 g/dL (ref 30.0–36.0)
MCV: 74.5 fL — AB (ref 78.0–100.0)
Platelets: 112 10*3/uL — ABNORMAL LOW (ref 150–400)
RBC: 4 MIL/uL — ABNORMAL LOW (ref 4.22–5.81)
RDW: 18.3 % — AB (ref 11.5–15.5)
WBC: 6 10*3/uL (ref 4.0–10.5)

## 2017-01-15 LAB — BASIC METABOLIC PANEL
Anion gap: 5 (ref 5–15)
BUN: 23 mg/dL — AB (ref 6–20)
CHLORIDE: 116 mmol/L — AB (ref 101–111)
CO2: 22 mmol/L (ref 22–32)
Calcium: 8 mg/dL — ABNORMAL LOW (ref 8.9–10.3)
Creatinine, Ser: 1.62 mg/dL — ABNORMAL HIGH (ref 0.61–1.24)
GFR calc Af Amer: 41 mL/min — ABNORMAL LOW (ref >60)
GFR calc non Af Amer: 36 mL/min — ABNORMAL LOW (ref >60)
GLUCOSE: 129 mg/dL — AB (ref 65–99)
POTASSIUM: 3.8 mmol/L (ref 3.5–5.1)
Sodium: 143 mmol/L (ref 135–145)

## 2017-01-15 LAB — GLUCOSE, CAPILLARY
GLUCOSE-CAPILLARY: 151 mg/dL — AB (ref 65–99)
GLUCOSE-CAPILLARY: 111 mg/dL — AB (ref 65–99)
GLUCOSE-CAPILLARY: 96 mg/dL (ref 65–99)
Glucose-Capillary: 107 mg/dL — ABNORMAL HIGH (ref 65–99)
Glucose-Capillary: 121 mg/dL — ABNORMAL HIGH (ref 65–99)
Glucose-Capillary: 138 mg/dL — ABNORMAL HIGH (ref 65–99)

## 2017-01-15 LAB — MAGNESIUM: Magnesium: 1.8 mg/dL (ref 1.7–2.4)

## 2017-01-15 LAB — PHOSPHORUS: Phosphorus: 1.8 mg/dL — ABNORMAL LOW (ref 2.5–4.6)

## 2017-01-15 NOTE — Progress Notes (Signed)
Pt sneezed and NG tube became dislodged and came out. Surgery paged.

## 2017-01-15 NOTE — Progress Notes (Signed)
PROGRESS NOTE    [name]  OHY:[id number] DOB: January 21, 1924 DOA: 01/12/2017 PCP: Foye Spurling, MD    Brief Narrative:  83 year old male with history of multiple abdominal surgeries including hiatal hernia repair, open cystectomy, colon surgery, exploratory laparotomy, hemorrhoid surgery, hernia repair, small intestinal surgery, and recurrent small bowel obstructions who presented with nausea, vomiting, abdominal distention, and abdominal pain. 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 The patient had an NG tube placed and general surgery was consulted. He has had marked improvement in his abdominal distention and abdominal pain. He no longer has nausea in the past and flatness this morning.  KUB pending.    Assessment & Plan:   Active Problems:   Atrial fibrillation ([name])   BRADYCARDIA-TACHYCARDIA SYNDROME   SBO (small bowel obstruction) ([name])   SOB (shortness of breath)   Small bowel obstruction ([name])   Malnutrition of moderate degree   Ventricular tachycardia ([name])  Small bowel obstruction, appears to be resolving. The patient has bowel sounds present this morning, soft abdomen and is passing flatness. -  KUB:  Pt noted to have mid-small bowel obstruction -  Improved with NG to low wall suction -  Surgery following, recommendations for clamping NG and trial of clears -  Ambulate the halls  Hemorrhage into NG, resolving -  Hemoglobin trended down by about a point, but I think this was hemodilutional -  Continue to hold anticoagulation today -  Continue PPI as tolerated  Atrial fibrillation and history of tachycardia/bradycardia syndrome, appears to be persistent on telemetry, rate in 80s to 90s.  Having intermittent V. tach  -  Oral anticoagulation held -  Holding Lovenox due to blood coming from NG tube on 8/27 (tube was mal-positioned) -  Resume IV metoprolol -  Check mag and phos -  Pacemaker  interrogated 8/27: Appears to be functioning properly - Stable  Diabetes mellitus type 2 -  Hemoglobin A1c -  Continue sliding scale insulin as needed  CK D stage III, baseline hemoglobin 1.4-1.5, creatinine rising slightly - Cr peaked to 1.83 - Cr improved with IVF - Repeat bmet   Moderate protein calorie malnutrition -  Advance diet as tolerated  DVT prophylaxis: SCD's Code Status: Full Family Communication: Pt in room, family not at bedside Disposition Plan: Uncertain at this time  Consultants:   General Surgery  Procedures:     Antimicrobials: Anti-infectives    None       Subjective: Reports feeling better. Eager to start eating  Objective: Vitals:   01/14/17 2040 01/15/17 0500 01/15/17 1116 01/15/17 1427  BP: 131/78 134/85 120/69 113/65  Pulse: 78 82 77 68  Resp: 16 20  20   Temp: 98 F (36.7 C) 99 F (37.2 C)  98.3 F (36.8 C)  TempSrc: Oral Oral  Oral  SpO2: 99% 98% 96% 100%  Weight:      Height:        Intake/Output Summary (Last 24 hours) at 01/15/17 1649 Last data filed at 01/15/17 1500  Gross per 24 hour  Intake             4320 ml  Output              985 ml  Net             3335 ml   Filed Weights   01/12/17 0900  Weight: 67.1 kg (147 lb 14.9 oz)    Examination:  General exam:  Appears calm and comfortable  Respiratory system: Clear to auscultation. Respiratory effort normal. Cardiovascular system: S1 & S2 heard, RRR.  Gastrointestinal system: Abdomen is nondistended, soft and nontender. No organomegaly or masses felt. Normal bowel sounds heard. Central nervous system: Alert and oriented. No focal neurological deficits. Extremities: Symmetric 5 x 5 power. Skin: No rashes, lesions  Psychiatry: Judgement and insight appear normal. Mood & affect appropriate.   Data Reviewed: I have personally reviewed following labs and imaging studies  CBC:  Recent Labs Lab 01/11/17 1954 01/13/17 0708 01/13/17 1636 01/14/17 0536  01/15/17 0523  WBC 13.0* 8.7 7.3 4.3 6.0  HGB 12.6* 11.8* 11.2* 10.1* 10.1*  HCT 37.1* 34.2* 33.7* 29.7* 29.8*  MCV 74.9* 75.0* 74.7* 74.1* 74.5*  PLT 142* 121* 112* 111* 950*   Basic Metabolic Panel:  Recent Labs Lab 01/11/17 1954 01/13/17 0708 01/14/17 0536 01/15/17 0523  NA 140 142 141 143  K 4.6 4.4 3.9 3.8  CL 104 112* 113* 116*  CO2 25 18* 21* 22  GLUCOSE 156* 81 150* 129*  BUN 22* 27* 28* 23*  CREATININE 1.65* 1.79* 1.83* 1.62*  CALCIUM 9.3 8.5* 8.2* 8.0*  MG  --   --   --  1.8  PHOS  --   --   --  1.8*   GFR: Estimated Creatinine Clearance: 28.8 mL/min (A) (by C-G formula based on SCr of 1.62 mg/dL (H)). Liver Function Tests:  Recent Labs Lab 01/11/17 1954  AST 25  ALT 9*  ALKPHOS 79  BILITOT 1.1  PROT 8.1  ALBUMIN 4.6    Recent Labs Lab 01/11/17 1954  LIPASE 32   No results for input(s): AMMONIA in the last 168 hours. Coagulation Profile: No results for input(s): INR, PROTIME in the last 168 hours. Cardiac Enzymes: No results for input(s): CKTOTAL, CKMB, CKMBINDEX, TROPONINI in the last 168 hours. BNP (last 3 results) No results for input(s): PROBNP in the last 8760 hours. HbA1C:  Recent Labs  01/13/17 0708  HGBA1C 6.7*   CBG:  Recent Labs Lab 01/14/17 2041 01/15/17 0009 01/15/17 0417 01/15/17 0728 01/15/17 1157  GLUCAP 112* 138* 151* 107* 121*   Lipid Profile: No results for input(s): CHOL, HDL, LDLCALC, TRIG, CHOLHDL, LDLDIRECT in the last 72 hours. Thyroid Function Tests: No results for input(s): TSH, T4TOTAL, FREET4, T3FREE, THYROIDAB in the last 72 hours. Anemia Panel: No results for input(s): VITAMINB12, FOLATE, FERRITIN, TIBC, IRON, RETICCTPCT in the last 72 hours. Sepsis Labs: No results for input(s): PROCALCITON, LATICACIDVEN in the last 168 hours.  Recent Results (from the past 240 hour(s))  MRSA PCR Screening     Status: None   Collection Time: 01/12/17 10:33 AM  Result Value Ref Range Status   MRSA by PCR  NEGATIVE NEGATIVE Final    Comment:        The GeneXpert MRSA Assay (FDA approved for NASAL specimens only), is one component of a comprehensive MRSA colonization surveillance program. It is not intended to diagnose MRSA infection nor to guide or monitor treatment for MRSA infections.      Radiology Studies: Dg Abd Portable 1v-small Bowel Obstruction Protocol-initial, 8 Hr Delay  Result Date: 01/14/2017 CLINICAL DATA:  8 hour delay for small bowel obstruction study. EXAM: PORTABLE ABDOMEN - 1 VIEW COMPARISON:  01/14/2017 at 5:16 a.m. FINDINGS: Dilute contrast is seen in dilated loops small bowel and in the right abdomen, likely within the colon. There are persistent dilated loops of small bowel, most evident in the left abdomen, similar  to the earlier study. The nasal/orogastric tube is stable, tip in the distal stomach. IMPRESSION: 1. The injected contrast is shown significant dilution. It does appear to have progressed distally into distal small bowel and probably the right colon. 2. There are persistent dilated loops of small bowel primarily within the left abdomen consistent with a partial small bowel obstruction. Electronically Signed   By: Lajean Manes M.D.   On: 01/14/2017 20:16   Dg Abd Portable 1v  Result Date: 01/14/2017 CLINICAL DATA:  Small bowel obstruction, follow-up. EXAM: PORTABLE ABDOMEN - 1 VIEW COMPARISON:  A supine abdominal radiograph of January 13, 2017 FINDINGS: There are loops of moderately distended gas-filled small bowel in the epigastrium and left mid abdomen. There is some gas in stool in normal caliber colon. No extraluminal gas collections are observed. The esophagogastric tube tip in proximal port project in the distal gastric body and are stable. There are degenerative changes of the lower lumbar spine. There surgical clips in the gallbladder fossa and GE junction region. IMPRESSION: Persistent mid small bowel obstruction.  No evidence of perforation.  Electronically Signed   By: David  Martinique M.D.   On: 01/14/2017 07:22   Dg Abd Portable 1v  Result Date: 01/13/2017 CLINICAL DATA:  Recent NG tube adjustment. EXAM: PORTABLE ABDOMEN - 1 VIEW COMPARISON:  Abdominal x-ray from same day. FINDINGS: Interval advancement of the NG tube, now with the tip and distal side port in the gastric antrum. Unchanged small bowel dilatation. IMPRESSION: Interval advancement of the NG tube, now in good position in the gastric antrum. Electronically Signed   By: Titus Dubin M.D.   On: 01/13/2017 16:59    Scheduled Meds: . insulin aspart  0-5 Units Subcutaneous Q4H  . metoprolol tartrate  2.5 mg Intravenous Q6H  . pantoprazole (PROTONIX) IV  40 mg Intravenous BID   Continuous Infusions: . dextrose 5 % and 0.45 % NaCl with KCl 10 mEq/L 125 mL/hr at 01/15/17 0655     LOS: 3 days   Siham Bucaro, Orpah Melter, MD [facility] Pager [phone]  If 7PM-7AM, please contact night-coverage [email or web address] Password [facility] 01/15/2017, 4:49 PM

## 2017-01-15 NOTE — Care Management Important Message (Signed)
Important Message  Patient Details  Name: [name] MRN: [id number] Date of Birth: 10-Feb-1924   Medicare Important Message Given:  Yes    Kerin Salen 01/15/2017, 10:43 AMImportant Message  Patient Details  Name: [name] MRN: [id number] Date of Birth: 1924-01-15   Medicare Important Message Given:  Yes    Kerin Salen 01/15/2017, 10:43 AM

## 2017-01-15 NOTE — Progress Notes (Signed)
Patient ID: [name], male   DOB: 10-14-1923, 83 y.o.   MRN: [id number]  [facility] Surgery Progress Note     Subjective: CC- SBO Patient states that he has had 2 BMs since yesterday. He is passing some flatus this morning. Feels like he may need to have another BM soon. Denies abdominal pain or bloating. Denies n/v. Ambulated in the halls yesterday. XR this AM showed contrast in the right colon, but there are still some dilated loops of small bowel.  Objective: Vital signs in last 24 hours: Temp:  [97.9 F (36.6 C)-99 F (37.2 C)] 99 F (37.2 C) (08/29 0500) Pulse Rate:  [78-89] 82 (08/29 0500) Resp:  [16-20] 20 (08/29 0500) BP: (122-134)/(62-85) 134/85 (08/29 0500) SpO2:  [98 %-100 %] 98 % (08/29 0500) Last BM Date: 01/14/17  Intake/Output from previous day: 08/28 0701 - 08/29 0700 In: 2955 [I.V.:2805; NG/GT:150] Out: 2185 [Urine:525; Emesis/NG output:1660] Intake/Output this shift: Total I/O In: -  Out: 150 [Emesis/NG output:150]  PE: Gen:  Alert, NAD, pleasant HEENT: EOM's intact, pupils equal and round Card:  RRR, no M/G/R heard Pulm:  CTAB, no W/R/R, effort normal Abd: well healed midline scar, soft, mild distension, nontender, +BS, no HSM, soft palpable hernia on the right Ext:  No erythema, edema, or tenderness BUE/BLE  Psych: A&Ox3  Skin: no rashes noted, warm and dry   Lab Results:   Recent Labs  01/14/17 0536 01/15/17 0523  WBC 4.3 6.0  HGB 10.1* 10.1*  HCT 29.7* 29.8*  PLT 111* 112*   BMET  Recent Labs  01/14/17 0536 01/15/17 0523  NA 141 143  K 3.9 3.8  CL 113* 116*  CO2 21* 22  GLUCOSE 150* 129*  BUN 28* 23*  CREATININE 1.83* 1.62*  CALCIUM 8.2* 8.0*   PT/INR No results for input(s): LABPROT, INR in the last 72 hours. CMP     Component Value Date/Time   NA 143 01/15/2017 0523   NA 141 11/08/2016 1100   K 3.8 01/15/2017 0523   CL 116 (H) 01/15/2017 0523   CO2 22 01/15/2017 0523   GLUCOSE 129 (H) 01/15/2017 0523    BUN 23 (H) 01/15/2017 0523   BUN 19 11/08/2016 1100   CREATININE 1.62 (H) 01/15/2017 0523   CREATININE 1.58 (H) 07/30/2016 1059   CALCIUM 8.0 (L) 01/15/2017 0523   PROT 8.1 01/11/2017 1954   ALBUMIN 4.6 01/11/2017 1954   AST 25 01/11/2017 1954   ALT 9 (L) 01/11/2017 1954   ALKPHOS 79 01/11/2017 1954   BILITOT 1.1 01/11/2017 1954   GFRNONAA 36 (L) 01/15/2017 0523   GFRAA 41 (L) 01/15/2017 0523   Lipase     Component Value Date/Time   LIPASE 32 01/11/2017 1954       Studies/Results: Dg Abd Portable 1v-small Bowel Obstruction Protocol-initial, 8 Hr Delay  Result Date: 01/14/2017 CLINICAL DATA:  8 hour delay for small bowel obstruction study. EXAM: PORTABLE ABDOMEN - 1 VIEW COMPARISON:  01/14/2017 at 5:16 a.m. FINDINGS: Dilute contrast is seen in dilated loops small bowel and in the right abdomen, likely within the colon. There are persistent dilated loops of small bowel, most evident in the left abdomen, similar to the earlier study. The nasal/orogastric tube is stable, tip in the distal stomach. IMPRESSION: 1. The injected contrast is shown significant dilution. It does appear to have progressed distally into distal small bowel and probably the right colon. 2. There are persistent dilated loops of small bowel primarily within the left  abdomen consistent with a partial small bowel obstruction. Electronically Signed   By: Lajean Manes M.D.   On: 01/14/2017 20:16   Dg Abd Portable 1v  Result Date: 01/14/2017 CLINICAL DATA:  Small bowel obstruction, follow-up. EXAM: PORTABLE ABDOMEN - 1 VIEW COMPARISON:  A supine abdominal radiograph of January 13, 2017 FINDINGS: There are loops of moderately distended gas-filled small bowel in the epigastrium and left mid abdomen. There is some gas in stool in normal caliber colon. No extraluminal gas collections are observed. The esophagogastric tube tip in proximal port project in the distal gastric body and are stable. There are degenerative changes of  the lower lumbar spine. There surgical clips in the gallbladder fossa and GE junction region. IMPRESSION: Persistent mid small bowel obstruction.  No evidence of perforation. Electronically Signed   By: David  Martinique M.D.   On: 01/14/2017 07:22   Dg Abd Portable 1v  Result Date: 01/13/2017 CLINICAL DATA:  Recent NG tube adjustment. EXAM: PORTABLE ABDOMEN - 1 VIEW COMPARISON:  Abdominal x-ray from same day. FINDINGS: Interval advancement of the NG tube, now with the tip and distal side port in the gastric antrum. Unchanged small bowel dilatation. IMPRESSION: Interval advancement of the NG tube, now in good position in the gastric antrum. Electronically Signed   By: Titus Dubin M.D.   On: 01/13/2017 16:59   Dg Abd Portable 1v  Result Date: 01/13/2017 CLINICAL DATA:  NG tube placement. EXAM: PORTABLE ABDOMEN - 1 VIEW COMPARISON:  01/12/2017 FINDINGS: The NG tube tip is near the GE junction but needs to be advanced several cm. Persistent but slightly improved small bowel dilatation. No definite free air. IMPRESSION: The NG tube tip is near the GE junction but needs to be advanced several cm into the stomach. Persistent but slightly improved small bowel dilatation. Electronically Signed   By: Marijo Sanes M.D.   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 Continue ambulating.    LOS: 3 days    Wellington [place] , [facility] Surgery 01/15/2017, 11:05 AM Pager: [phone] Consults: [phone] Mon-Fri 7:00 am-4:30 pm Sat-Sun 7:00 am-11:30 am

## 2017-01-16 LAB — CBC
HEMATOCRIT: 29.6 % — AB (ref 39.0–52.0)
HEMOGLOBIN: 10 g/dL — AB (ref 13.0–17.0)
MCH: 25.2 pg — ABNORMAL LOW (ref 26.0–34.0)
MCHC: 33.8 g/dL (ref 30.0–36.0)
MCV: 74.6 fL — ABNORMAL LOW (ref 78.0–100.0)
Platelets: 110 10*3/uL — ABNORMAL LOW (ref 150–400)
RBC: 3.97 MIL/uL — AB (ref 4.22–5.81)
RDW: 18.2 % — ABNORMAL HIGH (ref 11.5–15.5)
WBC: 5.8 10*3/uL (ref 4.0–10.5)

## 2017-01-16 LAB — GLUCOSE, CAPILLARY
GLUCOSE-CAPILLARY: 103 mg/dL — AB (ref 65–99)
GLUCOSE-CAPILLARY: 145 mg/dL — AB (ref 65–99)
GLUCOSE-CAPILLARY: 146 mg/dL — AB (ref 65–99)
GLUCOSE-CAPILLARY: 83 mg/dL (ref 65–99)
Glucose-Capillary: 144 mg/dL — ABNORMAL HIGH (ref 65–99)
Glucose-Capillary: 152 mg/dL — ABNORMAL HIGH (ref 65–99)

## 2017-01-16 LAB — BASIC METABOLIC PANEL
ANION GAP: 5 (ref 5–15)
BUN: 17 mg/dL (ref 6–20)
CALCIUM: 7.9 mg/dL — AB (ref 8.9–10.3)
CO2: 20 mmol/L — AB (ref 22–32)
Chloride: 115 mmol/L — ABNORMAL HIGH (ref 101–111)
Creatinine, Ser: 1.38 mg/dL — ABNORMAL HIGH (ref 0.61–1.24)
GFR, EST AFRICAN AMERICAN: 50 mL/min — AB (ref >60)
GFR, EST NON AFRICAN AMERICAN: 43 mL/min — AB (ref >60)
Glucose, Bld: 150 mg/dL — ABNORMAL HIGH (ref 65–99)
POTASSIUM: 4.3 mmol/L (ref 3.5–5.1)
Sodium: 140 mmol/L (ref 135–145)

## 2017-01-16 MED ORDER — METOPROLOL SUCCINATE ER 50 MG PO TB24: 50.0000 mg | ORAL_TABLET | Freq: Every evening | ORAL | Status: DC

## 2017-01-16 MED ORDER — PANTOPRAZOLE SODIUM 40 MG PO TBEC
40.0000 mg | DELAYED_RELEASE_TABLET | Freq: Every day | ORAL | Status: DC
  Administered 2017-01-16 – 2017-01-17 (×2): 40 mg via ORAL
  Filled 2017-01-16 (×2): qty 1

## 2017-01-16 MED ORDER — LOSARTAN POTASSIUM-HCTZ 100-12.5 MG PO TABS: 1.0000 | ORAL_TABLET | Freq: Every evening | ORAL | Status: DC

## 2017-01-16 MED ORDER — INSULIN ASPART 100 UNIT/ML ~~LOC~~ SOLN: 0.0000 [IU] | Freq: Every day | SUBCUTANEOUS | Status: DC

## 2017-01-16 MED ORDER — LOSARTAN POTASSIUM 50 MG PO TABS
100.0000 mg | ORAL_TABLET | Freq: Every day | ORAL | Status: DC
  Administered 2017-01-17: 100 mg via ORAL
  Filled 2017-01-16: qty 2

## 2017-01-16 MED ORDER — INSULIN ASPART 100 UNIT/ML ~~LOC~~ SOLN: 0.0000 [IU] | Freq: Three times a day (TID) | SUBCUTANEOUS | Status: DC

## 2017-01-16 MED ORDER — HYDROCHLOROTHIAZIDE 12.5 MG PO CAPS
12.5000 mg | ORAL_CAPSULE | Freq: Every day | ORAL | Status: DC
  Administered 2017-01-17: 12.5 mg via ORAL
  Filled 2017-01-16: qty 1

## 2017-01-16 NOTE — Progress Notes (Signed)
Patient ID: [name], male   DOB: 03-Dec-1923, 83 y.o.   MRN: [id number]  [facility] Surgery Progress Note     Subjective: CC- SBO No complaints this morning. Doing well with clear liquids. Denies n/v. Denies any abdominal pain or increased bloating. Asking when he can go home.  Objective: Vital signs in last 24 hours: Temp:  [98.3 F (36.8 C)-98.4 F (36.9 C)] 98.4 F (36.9 C) (08/30 0421) Pulse Rate:  [68-100] 71 (08/30 0421) Resp:  [18-20] 18 (08/30 0421) BP: (111-141)/(64-69) 111/64 (08/30 0421) SpO2:  [96 %-100 %] 100 % (08/30 0421) Last BM Date: 01/15/17  Intake/Output from previous day: 08/29 0701 - 08/30 0700 In: 1365 [P.O.:240; I.V.:1125] Out: 250 [Emesis/NG output:250] Intake/Output this shift: No intake/output data recorded.  PE: Gen: Alert, NAD, pleasant HEENT: EOM's intact, pupils equal and round Card: RRR, no M/G/R heard Pulm: CTAB, no W/R/R, effort normal Abd: well healed midline scar, soft, mild distension, nontender, +BS, no HSM, soft palpable hernia on the right Ext: No erythema, edema, or tenderness BUE/BLE  Psych: A&Ox3  Skin: no rashes noted, warm and dry  Lab Results:   Recent Labs  01/15/17 0523 01/16/17 0606  WBC 6.0 5.8  HGB 10.1* 10.0*  HCT 29.8* 29.6*  PLT 112* 110*   BMET  Recent Labs  01/15/17 0523 01/16/17 0606  NA 143 140  K 3.8 4.3  CL 116* 115*  CO2 22 20*  GLUCOSE 129* 150*  BUN 23* 17  CREATININE 1.62* 1.38*  CALCIUM 8.0* 7.9*   PT/INR No results for input(s): LABPROT, INR in the last 72 hours. CMP     Component Value Date/Time   NA 140 01/16/2017 0606   NA 141 11/08/2016 1100   K 4.3 01/16/2017 0606   CL 115 (H) 01/16/2017 0606   CO2 20 (L) 01/16/2017 0606   GLUCOSE 150 (H) 01/16/2017 0606   BUN 17 01/16/2017 0606   BUN 19 11/08/2016 1100   CREATININE 1.38 (H) 01/16/2017 0606   CREATININE 1.58 (H) 07/30/2016 1059   CALCIUM 7.9 (L) 01/16/2017 0606   PROT 8.1 01/11/2017 1954   ALBUMIN  4.6 01/11/2017 1954   AST 25 01/11/2017 1954   ALT 9 (L) 01/11/2017 1954   ALKPHOS 79 01/11/2017 1954   BILITOT 1.1 01/11/2017 1954   GFRNONAA 43 (L) 01/16/2017 0606   GFRAA 50 (L) 01/16/2017 0606   Lipase     Component Value Date/Time   LIPASE 32 01/11/2017 1954       Studies/Results: Dg Abd Portable 1v-small Bowel Obstruction Protocol-initial, 8 Hr Delay  Result Date: 01/14/2017 CLINICAL DATA:  8 hour delay for small bowel obstruction study. EXAM: PORTABLE ABDOMEN - 1 VIEW COMPARISON:  01/14/2017 at 5:16 a.m. FINDINGS: Dilute contrast is seen in dilated loops small bowel and in the right abdomen, likely within the colon. There are persistent dilated loops of small bowel, most evident in the left abdomen, similar to the earlier study. The nasal/orogastric tube is stable, tip in the distal stomach. IMPRESSION: 1. The injected contrast is shown significant dilution. It does appear to have progressed distally into distal small bowel and probably the right colon. 2. There are persistent dilated loops of small bowel primarily within the left abdomen consistent with a partial small bowel obstruction. Electronically Signed   By: Lajean Manes M.D.   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 Advance to full liquids. Continue ambulating. Ok to advance to soft diet later today if tolerating fulls.   LOS: 4 days     Wellington Hampshire , [facility] Surgery 01/16/2017, 8:22 AM Pager: [phone] Consults: [phone] Mon-Fri 7:00 am-4:30 pm Sat-Sun 7:00 am-11:30 am

## 2017-01-16 NOTE — Evaluation (Signed)
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 Has a RW but does not use it. He reports that hiswith granddaighter and niece assist as needed. No family present to confirm caregiver availability. Pt admitted with above diagnosis. Pt currently with functional limitations due to the deficits listed below (see PT Problem List).  Pt will benefit from skilled PT to increase their independence and safety with mobility to allow discharge to the venue listed below.      Follow Up Recommendations Home health PT;Supervision/Assistance - 24 hour    Equipment Recommendations  None recommended by PT    Recommendations for Other Services OT consult     Precautions / Restrictions Precautions Precautions: Fall      Mobility  Bed Mobility Overal bed mobility: Needs Assistance Bed Mobility: Supine to Sit     Supine to sit: Supervision;HOB elevated     General bed mobility comments: no physical assistance required by therapist/  Transfers Overall transfer level: Needs assistance Equipment used: Rolling walker (2 wheeled) Transfers: Sit to/from Stand Sit to Stand: Min guard         General transfer comment: cues for hand placement  Ambulation/Gait Ambulation/Gait assistance: Min guard Ambulation Distance (Feet): 350 Feet Assistive device: Rolling walker (2 wheeled) Gait Pattern/deviations: Step-through pattern;Decreased stride length     General Gait Details: Gait improved with distance. had patient ambulate without AD. No loss of balance noted. Gait slower without AD.  HR 117.  Stairs            Wheelchair Mobility    Modified Rankin (Stroke Patients Only)       Balance Overall balance assessment: Needs assistance   Sitting balance-Leahy Scale: Normal     Standing balance support: No upper extremity supported Standing balance-Leahy Scale: Fair                               Pertinent Vitals/Pain Pain Assessment: No/denies pain    Home Living Family/patient expects to be discharged to:: Private residence Living Arrangements: Alone Available Help at Discharge: Family;Available 24 hours/day Type of Home: House Home Access: Stairs to enter Entrance Stairs-Rails: None Entrance Stairs-Number of Steps: 2 Home Layout: One level Home Equipment: Walker - 2 wheels Additional Comments: info taken from previous  encounter, patient reports granddaughter and niece assist PRN.    Prior Function Level of Independence: Independent         Comments: patient drives, independent. Says someone checks his sugars/     Hand Dominance        Extremity/Trunk Assessment   Upper Extremity Assessment Upper Extremity Assessment: Overall WFL for tasks assessed    Lower Extremity Assessment Lower Extremity Assessment: Overall WFL for tasks assessed    Cervical / Trunk Assessment Cervical / Trunk Assessment: Kyphotic  Communication      Cognition Arousal/Alertness: Awake/alert Behavior During Therapy: WFL for tasks assessed/performed Overall Cognitive Status: Within Functional Limits for tasks assessed  General Comments      Exercises     Assessment/Plan    PT Assessment Patient needs continued PT services  PT Problem List Decreased activity tolerance;Decreased balance;Decreased mobility       PT Treatment Interventions DME instruction;Gait training;Functional mobility training;Therapeutic activities;Patient/family education    PT Goals (Current goals can be  found in the Care Plan section)  Acute Rehab PT Goals Patient Stated Goal: I'm planning to go home PT Goal Formulation: With patient Time For Goal Achievement: 01/30/17 Potential to Achieve Goals: Good    Frequency Min 3X/week   Barriers to discharge        Co-evaluation               AM-PAC PT "6 Clicks" Daily Activity  Outcome Measure Difficulty turning over in bed (including adjusting bedclothes, sheets and blankets)?: A Little Difficulty moving from lying on back to sitting on the side of the bed? : A Little Difficulty sitting down on and standing up from a chair with arms (e.g., wheelchair, bedside commode, etc,.)?: A Little Help needed moving to and from a bed to chair (including a wheelchair)?: A Little Help needed walking in hospital room?: A Little Help needed climbing 3-5 steps with a railing? : A Lot 6 Click Score: 17    End of Session Equipment Utilized During Treatment: Gait belt Activity Tolerance: Patient tolerated treatment well Patient left: in chair;with call bell/phone within reach;with chair alarm set Nurse Communication: Mobility status PT Visit Diagnosis: Difficulty in walking, not elsewhere classified (R26.2)    Time: 1610-9604 PT Time Calculation (min) (ACUTE ONLY): 28 min   Charges:   PT Evaluation $PT Eval Low Complexity: 1 Low PT Treatments $Gait Training: 8-22 mins   PT G CodesTresa Endo PT [phone]  Claretha Cooper 01/16/2017, 1:08 PM

## 2017-01-16 NOTE — Progress Notes (Signed)
PROGRESS NOTE    [name]  DXI:[id number] DOB: September 12, 1923 DOA: 01/12/2017 PCP: Foye Spurling, MD    Brief Narrative:  83 year old male with history of multiple abdominal surgeries including hiatal hernia repair, open cystectomy, colon surgery, exploratory laparotomy, hemorrhoid surgery, hernia repair, small intestinal surgery, and recurrent small bowel obstructions who presented with nausea, vomiting, abdominal distention, and abdominal pain. 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 The patient had an NG tube placed and general surgery was consulted. He has had marked improvement in his abdominal distention and abdominal pain. He no longer has nausea in the past and flatness this morning.  KUB pending.    Assessment & Plan:   Active Problems:   Atrial fibrillation ([name])   BRADYCARDIA-TACHYCARDIA SYNDROME   SBO (small bowel obstruction) ([name])   SOB (shortness of breath)   Small bowel obstruction ([name])   Malnutrition of moderate degree   Ventricular tachycardia ([name])  Small bowel obstruction, appears to be resolving. The patient has bowel sounds present this morning, soft abdomen and is passing flatness. -  KUB:  Pt noted to have mid-small bowel obstruction -  Improved following NG to low wall suction -  Surgery continues to follow. Advancing diet as tolerated - Continue to ambulate  Hemorrhage into NG, resolving -  Hemoglobin trended down by about a point, but I think this was hemodilutional -  Continue to hold anticoagulation for now - Stable at present  Atrial fibrillation and history of tachycardia/bradycardia syndrome, appears to be persistent on telemetry, rate in 80s to 90s.  Having intermittent V. tach  -  Oral anticoagulation held -  Holding Lovenox due to blood coming from NG tube on 8/27 (tube was mal-positioned) -  Resume IV metoprolol -  Check mag and phos -  Pacemaker interrogated 8/27:  Appears to be functioning properly - remains stable  Diabetes mellitus type 2 -  Hemoglobin A1c -  Continue SSI coverage as tolerated  CK D stage III, baseline hemoglobin 1.4-1.5, creatinine rising slightly - Cr peaked to 1.83 - Cr improved with IVF - will recheck bmet in AM  Moderate protein calorie malnutrition -  cont to advance diet as tolerated  DVT prophylaxis: SCD's Code Status: Full Family Communication: Pt in room, family not at bedside Disposition Plan: Uncertain at this time  Consultants:   General Surgery  Procedures:     Antimicrobials: Anti-infectives    None      Subjective: States feeling better. Eager to advance diet further today  Objective: Vitals:   01/15/17 1427 01/15/17 2232 01/16/17 0421 01/16/17 1310  BP: 113/65 (!) 141/67 111/64 116/61  Pulse: 68 100 71 72  Resp: 20 18 18 18   Temp: 98.3 F (36.8 C) 98.4 F (36.9 C) 98.4 F (36.9 C)   TempSrc: Oral Oral Oral   SpO2: 100% 100% 100% 98%  Weight:      Height:       No intake or output data in the 24 hours ending 01/16/17 1628 Filed Weights   01/12/17 0900  Weight: 67.1 kg (147 lb 14.9 oz)    Examination: General exam: Awake, laying in bed, in nad Respiratory system: Normal respiratory effort, no wheezing Cardiovascular system: regular rate, s1, s2 Gastrointestinal system: Soft, nondistended, positive BS Central nervous system: CN2-12 grossly intact, strength intact Extremities: Perfused, no clubbing Skin: Normal skin turgor, no notable skin lesions seen Psychiatry: Mood normal // no visual hallucinations   Data Reviewed: I  have personally reviewed following labs and imaging studies  CBC:  Recent Labs Lab 01/13/17 0708 01/13/17 1636 01/14/17 0536 01/15/17 0523 01/16/17 0606  WBC 8.7 7.3 4.3 6.0 5.8  HGB 11.8* 11.2* 10.1* 10.1* 10.0*  HCT 34.2* 33.7* 29.7* 29.8* 29.6*  MCV 75.0* 74.7* 74.1* 74.5* 74.6*  PLT 121* 112* 111* 112* 676*   Basic Metabolic  Panel:  Recent Labs Lab 01/11/17 1954 01/13/17 0708 01/14/17 0536 01/15/17 0523 01/16/17 0606  NA 140 142 141 143 140  K 4.6 4.4 3.9 3.8 4.3  CL 104 112* 113* 116* 115*  CO2 25 18* 21* 22 20*  GLUCOSE 156* 81 150* 129* 150*  BUN 22* 27* 28* 23* 17  CREATININE 1.65* 1.79* 1.83* 1.62* 1.38*  CALCIUM 9.3 8.5* 8.2* 8.0* 7.9*  MG  --   --   --  1.8  --   PHOS  --   --   --  1.8*  --    GFR: Estimated Creatinine Clearance: 33.8 mL/min (A) (by C-G formula based on SCr of 1.38 mg/dL (H)). Liver Function Tests:  Recent Labs Lab 01/11/17 1954  AST 25  ALT 9*  ALKPHOS 79  BILITOT 1.1  PROT 8.1  ALBUMIN 4.6    Recent Labs Lab 01/11/17 1954  LIPASE 32   No results for input(s): AMMONIA in the last 168 hours. Coagulation Profile: No results for input(s): INR, PROTIME in the last 168 hours. Cardiac Enzymes: No results for input(s): CKTOTAL, CKMB, CKMBINDEX, TROPONINI in the last 168 hours. BNP (last 3 results) No results for input(s): PROBNP in the last 8760 hours. HbA1C: No results for input(s): HGBA1C in the last 72 hours. CBG:  Recent Labs Lab 01/15/17 2036 01/16/17 0012 01/16/17 0418 01/16/17 0730 01/16/17 1153  GLUCAP 96 146* 144* 152* 145*   Lipid Profile: No results for input(s): CHOL, HDL, LDLCALC, TRIG, CHOLHDL, LDLDIRECT in the last 72 hours. Thyroid Function Tests: No results for input(s): TSH, T4TOTAL, FREET4, T3FREE, THYROIDAB in the last 72 hours. Anemia Panel: No results for input(s): VITAMINB12, FOLATE, FERRITIN, TIBC, IRON, RETICCTPCT in the last 72 hours. Sepsis Labs: No results for input(s): PROCALCITON, LATICACIDVEN in the last 168 hours.  Recent Results (from the past 240 hour(s))  MRSA PCR Screening     Status: None   Collection Time: 01/12/17 10:33 AM  Result Value Ref Range Status   MRSA by PCR NEGATIVE NEGATIVE Final    Comment:        The GeneXpert MRSA Assay (FDA approved for NASAL specimens only), is one component of  a comprehensive MRSA colonization surveillance program. It is not intended to diagnose MRSA infection nor to guide or monitor treatment for MRSA infections.      Radiology Studies: Dg Abd Portable 1v-small Bowel Obstruction Protocol-initial, 8 Hr Delay  Result Date: 01/14/2017 CLINICAL DATA:  8 hour delay for small bowel obstruction study. EXAM: PORTABLE ABDOMEN - 1 VIEW COMPARISON:  01/14/2017 at 5:16 a.m. FINDINGS: Dilute contrast is seen in dilated loops small bowel and in the right abdomen, likely within the colon. There are persistent dilated loops of small bowel, most evident in the left abdomen, similar to the earlier study. The nasal/orogastric tube is stable, tip in the distal stomach. IMPRESSION: 1. The injected contrast is shown significant dilution. It does appear to have progressed distally into distal small bowel and probably the right colon. 2. There are persistent dilated loops of small bowel primarily within the left abdomen consistent with a partial small bowel  obstruction. Electronically Signed   By: Lajean Manes M.D.   On: 01/14/2017 20:16    Scheduled Meds: . insulin aspart  0-5 Units Subcutaneous Q4H  . metoprolol tartrate  2.5 mg Intravenous Q6H  . pantoprazole (PROTONIX) IV  40 mg Intravenous BID   Continuous Infusions: . dextrose 5 % and 0.45 % NaCl with KCl 10 mEq/L 50 mL/hr at 01/16/17 0837     LOS: 4 days   Luisenrique Conran, Orpah Melter, MD [facility] Pager [phone]  If 7PM-7AM, please contact night-coverage [email or web address] Password [email or web address] 01/16/2017, 4:28 PM

## 2017-01-17 DIAGNOSIS — I480 Paroxysmal atrial fibrillation: Secondary | ICD-10-CM

## 2017-01-17 LAB — CBC
HCT: 29.8 % — ABNORMAL LOW (ref 39.0–52.0)
HEMOGLOBIN: 10.4 g/dL — AB (ref 13.0–17.0)
MCH: 26 pg (ref 26.0–34.0)
MCHC: 34.9 g/dL (ref 30.0–36.0)
MCV: 74.5 fL — ABNORMAL LOW (ref 78.0–100.0)
Platelets: 106 10*3/uL — ABNORMAL LOW (ref 150–400)
RBC: 4 MIL/uL — ABNORMAL LOW (ref 4.22–5.81)
RDW: 18.3 % — ABNORMAL HIGH (ref 11.5–15.5)
WBC: 6.2 10*3/uL (ref 4.0–10.5)

## 2017-01-17 LAB — BASIC METABOLIC PANEL
ANION GAP: 6 (ref 5–15)
BUN: 16 mg/dL (ref 6–20)
CALCIUM: 7.8 mg/dL — AB (ref 8.9–10.3)
CO2: 18 mmol/L — ABNORMAL LOW (ref 22–32)
Chloride: 113 mmol/L — ABNORMAL HIGH (ref 101–111)
Creatinine, Ser: 1.41 mg/dL — ABNORMAL HIGH (ref 0.61–1.24)
GFR, EST AFRICAN AMERICAN: 49 mL/min — AB (ref >60)
GFR, EST NON AFRICAN AMERICAN: 42 mL/min — AB (ref >60)
GLUCOSE: 102 mg/dL — AB (ref 65–99)
Potassium: 4.7 mmol/L (ref 3.5–5.1)
SODIUM: 137 mmol/L (ref 135–145)

## 2017-01-17 LAB — GLUCOSE, CAPILLARY: GLUCOSE-CAPILLARY: 107 mg/dL — AB (ref 65–99)

## 2017-01-17 NOTE — Progress Notes (Signed)
Pts IV removed with a clean and dry dressing intact. Pt denies pain at the time of discharge. Pts family brought in the clothes he believed were missing and returned to him. Pt taken to the front entrance via wheelchair with nursing staff and family present

## 2017-01-17 NOTE — Progress Notes (Signed)
Assessment SBO-resolved    Plan:  Okay for discharge from our standpoint.  No need for CCS follow up.   LOS: 5 days        Chief Complaint/Subjective: Ate this morning and had no pain.  Walking in the hall.  Objective: Vital signs in last 24 hours: Temp:  [98 F (36.7 C)-99.8 F (37.7 C)] 98 F (36.7 C) (08/31 0834) Pulse Rate:  [72-94] 78 (08/31 0834) Resp:  [16-18] 18 (08/31 0834) BP: (103-125)/(61-73) 103/61 (08/31 0834) SpO2:  [95 %-99 %] 98 % (08/31 0834) Last BM Date: 01/16/17  Intake/Output from previous day: No intake/output data recorded. Intake/Output this shift: Total I/O In: 500 [P.O.:500] Out: 200 [Urine:200]  PE: General- In NAD.  Awake and alert. Abdomen-soft, not tender, reducible hernias  Lab Results:   Recent Labs  01/16/17 0606 01/17/17 0538  WBC 5.8 6.2  HGB 10.0* 10.4*  HCT 29.6* 29.8*  PLT 110* 106*   BMET  Recent Labs  01/16/17 0606 01/17/17 0538  NA 140 137  K 4.3 4.7  CL 115* 113*  CO2 20* 18*  GLUCOSE 150* 102*  BUN 17 16  CREATININE 1.38* 1.41*  CALCIUM 7.9* 7.8*   PT/INR No results for input(s): LABPROT, INR in the last 72 hours. Comprehensive Metabolic Panel:    Component Value Date/Time   NA 137 01/17/2017 0538   NA 140 01/16/2017 0606   NA 141 11/08/2016 1100   K 4.7 01/17/2017 0538   K 4.3 01/16/2017 0606   CL 113 (H) 01/17/2017 0538   CL 115 (H) 01/16/2017 0606   CO2 18 (L) 01/17/2017 0538   CO2 20 (L) 01/16/2017 0606   BUN 16 01/17/2017 0538   BUN 17 01/16/2017 0606   BUN 19 11/08/2016 1100   CREATININE 1.41 (H) 01/17/2017 0538   CREATININE 1.38 (H) 01/16/2017 0606   CREATININE 1.58 (H) 07/30/2016 1059   GLUCOSE 102 (H) 01/17/2017 0538   GLUCOSE 150 (H) 01/16/2017 0606   CALCIUM 7.8 (L) 01/17/2017 0538   CALCIUM 7.9 (L) 01/16/2017 0606   AST 25 01/11/2017 1954   AST 20 11/02/2016 1638   ALT 9 (L) 01/11/2017 1954   ALT 8 (L) 11/02/2016 1638   ALKPHOS 79 01/11/2017 1954   ALKPHOS 67 11/02/2016  1638   BILITOT 1.1 01/11/2017 1954   BILITOT 0.6 11/02/2016 1638   PROT 8.1 01/11/2017 1954   PROT 6.6 11/02/2016 1638   ALBUMIN 4.6 01/11/2017 1954   ALBUMIN 3.7 11/02/2016 1638     Studies/Results: No results found.  Anti-infectives: Anti-infectives    None       Aylyn Wenzler J 01/17/2017

## 2017-01-17 NOTE — Discharge Summary (Signed)
Physician Discharge Summary  [name] HUD:[id number] DOB: February 02, 1924 DOA: 01/12/2017  PCP: Foye Spurling, MD  Admit date: 01/12/2017 Discharge date: 01/17/2017  Admitted From: Home Disposition:  Home  Recommendations for Outpatient Follow-up:  1. Follow up with PCP in 1-2 weeks 2. Please note: losartan/HCTZ was held on discharge secondary to soft blood pressure. 3. Please continue to monitor BP and resume losartan/HCTZ as blood pressure tolerates  Discharge Condition:Improved CODE STATUS:Full Diet recommendation: Soft, advance as tolerated   Brief/Interim Summary: 83 year old male with history of multiple abdominal surgeries including hiatal hernia repair, open cystectomy, colon surgery, exploratory laparotomy, hemorrhoid surgery, hernia repair, small intestinal surgery, and recurrent small bowel obstructions who presented with nausea, vomiting, abdominal distention, and abdominal pain. 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 The patient had an NG tube placed and general surgery was consulted. He has had marked improvement in his abdominal distention and abdominal pain. He no longer has nausea in the past and flatness this morning. KUB pending.   Small bowel obstruction, appears to be resolving.  - KUB: Pt noted to have mid-small bowel obstruction - Improved following NG to low wall suction - Surgery was consulted. Diet was successfully advanced. Obstruction resolved.  Hemorrhage into NG, resolving - Hemoglobin trended down by about a point, but was likely hemodilutional - Anticoagulation was briefly held - No further evidence of acute bleed - Pt to resume anticoagulation on d/c  Atrial fibrillation and history of tachycardia/bradycardia syndrome, appears to be persistent on telemetry, rate in 80s to 90s.  - Oral anticoagulation held during admission - Holding Lovenox due to blood coming from NG  tube on 8/27 (tube was mal-positioned) - Resume IV metoprolol - Pacemaker interrogated 8/27: Appears to be functioning properly - remained stable  Diabetes mellitus type 2 - Continue SSI coverage as tolerated while admitted  CK D stage III, baseline hemoglobin 1.4-1.5, - Cr peaked to 1.83 - Cr improved with IVF  Moderate protein calorie malnutrition - successfully advanced diet  Discharge Diagnoses:  Active Problems:   Atrial fibrillation ([name])   BRADYCARDIA-TACHYCARDIA SYNDROME   SBO (small bowel obstruction) ([name])   SOB (shortness of breath)   Small bowel obstruction ([name])   Malnutrition of moderate degree   Ventricular tachycardia ([place])    Discharge Instructions   Allergies as of 01/17/2017   No Known Allergies     Medication List    STOP taking these medications   losartan-hydrochlorothiazide 100-12.5 MG tablet Commonly known as:  HYZAAR     TAKE these medications   bisacodyl 5 MG EC tablet Generic drug:  bisacodyl Take 5 mg by mouth daily as needed for moderate constipation.   bisacodyl 10 MG suppository Commonly known as:  DULCOLAX Place 10 mg rectally daily as needed for moderate constipation.   cyproheptadine 4 MG tablet Commonly known as:  PERIACTIN Take 4 mg by mouth at bedtime.   feeding supplement Liqd Take 237 mLs by mouth 3 (three) times daily as needed. Food supplement   furosemide 20 MG tablet Commonly known as:  LASIX Take 1 tablet (20 mg total) by mouth daily.   metoprolol succinate 50 MG 24 hr tablet Commonly known as:  TOPROL-XL Take 50 mg by mouth every evening.   pantoprazole 40 MG tablet Commonly known as:  PROTONIX Take 1 tablet (40 mg total) by mouth daily.   Rivaroxaban 15 MG Tabs tablet Commonly known as:  XARELTO Take 1 tablet (15 mg total)  by mouth daily with supper.      Follow-up Information    Foye Spurling, MD. Schedule an appointment as soon as possible for a visit in 1 week(s).   Specialty:   Internal Medicine Contact information: [address] [place] [address] [phone]          No Known Allergies  Consultations:  General Surgery  Procedures/Studies: Dg Abdomen 1 View  Result Date: 01/12/2017 CLINICAL DATA:  Nasogastric tube placement EXAM: ABDOMEN - 1 VIEW COMPARISON:  CT abdomen pelvis 01/12/2017 FINDINGS: A nasogastric tube is coiled in the hiatal hernia, just above the diaphragm. Diffusely dilated small bowel throughout the abdomen, mostly gas-filled. IMPRESSION: NG tube coiled in the hiatal hernia. Electronically Signed   By: Ulyses Jarred M.D.   On: 01/12/2017 04:23   Ct Abdomen Pelvis W Contrast  Result Date: 01/12/2017 CLINICAL DATA:  Generalized abdominal pain x2 days with emesis. EXAM: CT ABDOMEN AND PELVIS WITH CONTRAST TECHNIQUE: Multidetector CT imaging of the abdomen and pelvis was performed using the standard protocol following bolus administration of intravenous contrast. CONTRAST:  45mL ISOVUE-300 IOPAMIDOL (ISOVUE-300) INJECTION 61% COMPARISON:  08/17/2013, 08/07/2012 CT CT FINDINGS: Lower chest: Cardiomegaly with small pericardial effusion or thickening, stable in appearance. Moderate-sized hiatal hernia, stable in appearance. Right-sided pacer wires are partially imaged. Small fat containing right Bochdalek hernia. Hepatobiliary: Status post cholecystectomy. No biliary dilatation. Stable 18 mm hypodensity in the right hepatic lobe adjacent to the gallbladder fossa may reflect chronic minimal fatty infiltration. This appears stable since 2014 comparison. Pancreas: Atrophic without ductal dilatation. Spleen: Metallic clips are again seen along the medial margin of the normal size spleen. No splenic mass. Adrenals/Urinary Tract: Normal bilateral adrenal glands. Mild renal cortical thinning right worse than left with tiny too small to characterize cysts bilaterally. No obstructive uropathy. The urinary bladder is physiologically distended.  Stomach/Bowel: Physiologic distention of the stomach with previously mentioned moderate-sized hiatal hernia. Normal small bowel rotation. Dilated fluid-filled loops of jejunum up to 3.9 cm in caliber are noted with two supraumbilical ventral hernias containing small bowel medially and large bowel laterally with a short segment of herniated small bowel to the right of the umbilicus. These however do not appear to be the source of bowel obstruction. There is enhancing mucosa with luminal narrowing involving small bowel loops in the right lower quadrant, series 2, image 70 just beyond small bowel anastomotic sutures in the right hemipelvis as well as on series 2 image 41. These may represent inflammatory strictures. Mid jejunal transmural thickening is also seen in the left hemiabdomen, series 5, image 73. The distal and terminal ileum appear decompressed. The colon is physiologically distended without inflammatory change. Vascular/Lymphatic: Mild aortoiliac atherosclerosis without aneurysm or dissection. No lymphadenopathy. Reproductive: Prostate is unremarkable. Other: Smaller free fluid is seen in the pelvis. Musculoskeletal: Degenerative disc disease L4-5. No aggressive nor acute osseous abnormality. IMPRESSION: 1. Small-bowel obstruction believed secondary to inflammatory strictures in the right hemipelvis given areas of luminal narrowing and mucosal enhancement seen. Additionally there is transmural thickening of mid jejunal loops in the left hemiabdomen without stricture consistent with small bowel inflammation. Small amount of free fluid in the pelvis. 2. Cardiomegaly with moderate-sized hiatal hernia appears stable. 3. Small right-sided fat containing Bochdalek hernia. 4. Three chronic right-sided ventral hernias, 2 of which are supraumbilical and 1 periumbilical containing small and large bowel loops as above described without causing bowel obstruction. 5. Stable 18 mm hypodensity in the right hepatic lobe  which  may represent focal fatty infiltration. 6. Aortoiliac atherosclerosis. 7. Degenerative disc disease L4-5. Electronically Signed   By: Ashley Royalty M.D.   On: 01/12/2017 02:39   Dg Abd Portable 1v-small Bowel Obstruction Protocol-initial, 8 Hr Delay  Result Date: 01/14/2017 CLINICAL DATA:  8 hour delay for small bowel obstruction study. EXAM: PORTABLE ABDOMEN - 1 VIEW COMPARISON:  01/14/2017 at 5:16 a.m. FINDINGS: Dilute contrast is seen in dilated loops small bowel and in the right abdomen, likely within the colon. There are persistent dilated loops of small bowel, most evident in the left abdomen, similar to the earlier study. The nasal/orogastric tube is stable, tip in the distal stomach. IMPRESSION: 1. The injected contrast is shown significant dilution. It does appear to have progressed distally into distal small bowel and probably the right colon. 2. There are persistent dilated loops of small bowel primarily within the left abdomen consistent with a partial small bowel obstruction. Electronically Signed   By: Lajean Manes M.D.   On: 01/14/2017 20:16   Dg Abd Portable 1v  Result Date: 01/14/2017 CLINICAL DATA:  Small bowel obstruction, follow-up. EXAM: PORTABLE ABDOMEN - 1 VIEW COMPARISON:  A supine abdominal radiograph of January 13, 2017 FINDINGS: There are loops of moderately distended gas-filled small bowel in the epigastrium and left mid abdomen. There is some gas in stool in normal caliber colon. No extraluminal gas collections are observed. The esophagogastric tube tip in proximal port project in the distal gastric body and are stable. There are degenerative changes of the lower lumbar spine. There surgical clips in the gallbladder fossa and GE junction region. IMPRESSION: Persistent mid small bowel obstruction.  No evidence of perforation. Electronically Signed   By: David  Martinique M.D.   On: 01/14/2017 07:22   Dg Abd Portable 1v  Result Date: 01/13/2017 CLINICAL DATA:  Recent NG tube  adjustment. EXAM: PORTABLE ABDOMEN - 1 VIEW COMPARISON:  Abdominal x-ray from same day. FINDINGS: Interval advancement of the NG tube, now with the tip and distal side port in the gastric antrum. Unchanged small bowel dilatation. IMPRESSION: Interval advancement of the NG tube, now in good position in the gastric antrum. Electronically Signed   By: Titus Dubin M.D.   On: 01/13/2017 16:59   Dg Abd Portable 1v  Result Date: 01/13/2017 CLINICAL DATA:  NG tube placement. EXAM: PORTABLE ABDOMEN - 1 VIEW COMPARISON:  01/12/2017 FINDINGS: The NG tube tip is near the GE junction but needs to be advanced several cm. Persistent but slightly improved small bowel dilatation. No definite free air. IMPRESSION: The NG tube tip is near the GE junction but needs to be advanced several cm into the stomach. Persistent but slightly improved small bowel dilatation. Electronically Signed   By: Marijo Sanes M.D.   On: 01/13/2017 16:37   Dg Addison Bailey G Tube Plc W/fl W/rad  Result Date: 01/12/2017 CLINICAL DATA:  83 year old with small bowel obstruction and needs decompression. Difficulty placing nasogastric tube due to hiatal hernia. EXAM: NASO G TUBE PLACEMENT WITH FL AND WITH RAD CONTRAST:  30 mL Isovue-300 FLUOROSCOPY TIME:  Fluoroscopy Time:  4 minutes and 12 seconds Radiation Exposure Index (if provided by the fluoroscopic device): 56.2 mGy COMPARISON:  Abdominal radiograph 01/12/2017 FINDINGS: Bilateral nostrils were anesthetized with viscous lidocaine. Nasogastric tube was advanced down the left nostril without difficulty. Tube was advanced into the distal esophagus. In the lower chest, the tube was coiling in the hiatal hernia. Small amount of contrast was injected to identified the stomach anatomy.  Most of the contrast refluxed into the esophagus. This contrast was mostly aspirated by the end of the procedure. The nasogastric tube was successfully advanced into the stomach body and antrum using a stiff Amplatz wire. Tube  was secured to the patient's nose. Fluoroscopic images were taken and saved for this procedure. IMPRESSION: Successful placement of nasogastric tube with fluoroscopic guidance. Electronically Signed   By: Markus Daft M.D.   On: 01/12/2017 11:37    Subjective: Eager to go home today  Discharge Exam: Vitals:   01/17/17 0511 01/17/17 0834  BP: 115/67 103/61  Pulse: 83 78  Resp: 16 18  Temp: 99 F (37.2 C) 98 F (36.7 C)  SpO2: 99% 98%   Vitals:   01/16/17 1310 01/16/17 2044 01/17/17 0511 01/17/17 0834  BP: 116/61 125/73 115/67 103/61  Pulse: 72 94 83 78  Resp: 18 18 16 18   Temp:  99.8 F (37.7 C) 99 F (37.2 C) 98 F (36.7 C)  TempSrc:  Oral Oral Oral  SpO2: 98% 95% 99% 98%  Weight:      Height:        General: Pt is alert, awake, not in acute distress Cardiovascular: RRR, S1/S2 +, no rubs, no gallops Respiratory: CTA bilaterally, no wheezing, no rhonchi Abdominal: Soft, NT, ND, bowel sounds + Extremities: no edema, no cyanosis   The results of significant diagnostics from this hospitalization (including imaging, microbiology, ancillary and laboratory) are listed below for reference.     Microbiology: Recent Results (from the past 240 hour(s))  MRSA PCR Screening     Status: None   Collection Time: 01/12/17 10:33 AM  Result Value Ref Range Status   MRSA by PCR NEGATIVE NEGATIVE Final    Comment:        The GeneXpert MRSA Assay (FDA approved for NASAL specimens only), is one component of a comprehensive MRSA colonization surveillance program. It is not intended to diagnose MRSA infection nor to guide or monitor treatment for MRSA infections.      Labs: BNP (last 3 results)  Recent Labs  11/02/16 1638  BNP 193.7*   Basic Metabolic Panel:  Recent Labs Lab 01/13/17 0708 01/14/17 0536 01/15/17 0523 01/16/17 0606 01/17/17 0538  NA 142 141 143 140 137  K 4.4 3.9 3.8 4.3 4.7  CL 112* 113* 116* 115* 113*  CO2 18* 21* 22 20* 18*  GLUCOSE 81 150*  129* 150* 102*  BUN 27* 28* 23* 17 16  CREATININE 1.79* 1.83* 1.62* 1.38* 1.41*  CALCIUM 8.5* 8.2* 8.0* 7.9* 7.8*  MG  --   --  1.8  --   --   PHOS  --   --  1.8*  --   --    Liver Function Tests:  Recent Labs Lab 01/11/17 1954  AST 25  ALT 9*  ALKPHOS 79  BILITOT 1.1  PROT 8.1  ALBUMIN 4.6    Recent Labs Lab 01/11/17 1954  LIPASE 32   No results for input(s): AMMONIA in the last 168 hours. CBC:  Recent Labs Lab 01/13/17 1636 01/14/17 0536 01/15/17 0523 01/16/17 0606 01/17/17 0538  WBC 7.3 4.3 6.0 5.8 6.2  HGB 11.2* 10.1* 10.1* 10.0* 10.4*  HCT 33.7* 29.7* 29.8* 29.6* 29.8*  MCV 74.7* 74.1* 74.5* 74.6* 74.5*  PLT 112* 111* 112* 110* 106*   Cardiac Enzymes: No results for input(s): CKTOTAL, CKMB, CKMBINDEX, TROPONINI in the last 168 hours. BNP: Invalid input(s): POCBNP CBG:  Recent Labs Lab 01/16/17 0730 01/16/17 1153 01/16/17  1654 01/16/17 2049 01/17/17 0745  GLUCAP 152* 145* 83 103* 107*   D-Dimer No results for input(s): DDIMER in the last 72 hours. Hgb A1c No results for input(s): HGBA1C in the last 72 hours. Lipid Profile No results for input(s): CHOL, HDL, LDLCALC, TRIG, CHOLHDL, LDLDIRECT in the last 72 hours. Thyroid function studies No results for input(s): TSH, T4TOTAL, T3FREE, THYROIDAB in the last 72 hours.  Invalid input(s): FREET3 Anemia work up No results for input(s): VITAMINB12, FOLATE, FERRITIN, TIBC, IRON, RETICCTPCT in the last 72 hours. Urinalysis    Component Value Date/Time   COLORURINE YELLOW 01/11/2017 2352   APPEARANCEUR CLEAR 01/11/2017 2352   LABSPEC 1.020 01/11/2017 2352   PHURINE 6.0 01/11/2017 2352   GLUCOSEU NEGATIVE 01/11/2017 2352   HGBUR NEGATIVE 01/11/2017 2352   BILIRUBINUR NEGATIVE 01/11/2017 2352   KETONESUR TRACE (A) 01/11/2017 2352   PROTEINUR NEGATIVE 01/11/2017 2352   UROBILINOGEN 0.2 08/17/2013 0945   NITRITE NEGATIVE 01/11/2017 2352   LEUKOCYTESUR NEGATIVE 01/11/2017 2352   Sepsis  Labs Invalid input(s): PROCALCITONIN,  WBC,  LACTICIDVEN Microbiology Recent Results (from the past 240 hour(s))  MRSA PCR Screening     Status: None   Collection Time: 01/12/17 10:33 AM  Result Value Ref Range Status   MRSA by PCR NEGATIVE NEGATIVE Final    Comment:        The GeneXpert MRSA Assay (FDA approved for NASAL specimens only), is one component of a comprehensive MRSA colonization surveillance program. It is not intended to diagnose MRSA infection nor to guide or monitor treatment for MRSA infections.      SIGNED:   Donne Hazel, MD  [facility] 01/17/2017, 5:50 PM  If 7PM-7AM, please contact night-coverage [email or web address] Password [email or web address]

## 2017-01-23 ENCOUNTER — Ambulatory Visit: Payer: Medicare Other | Admitting: Adult Health

## 2017-01-27 ENCOUNTER — Encounter: Payer: Self-pay | Admitting: Adult Health

## 2017-01-27 ENCOUNTER — Ambulatory Visit (INDEPENDENT_AMBULATORY_CARE_PROVIDER_SITE_OTHER): Payer: Medicare Other | Admitting: Adult Health

## 2017-01-27 VITALS — BP 87/55 | HR 69 | Wt 144.2 lb

## 2017-01-27 DIAGNOSIS — Z8673 Personal history of transient ischemic attack (TIA), and cerebral infarction without residual deficits: Secondary | ICD-10-CM

## 2017-01-27 NOTE — Patient Instructions (Signed)
Your Plan:  Continue Xarelto Blood pressure goal less than 130/90 Cholesterol LDL less than 70 Hemoglobin A1c less than 6.5% If you have any strokelike symptoms call 911 If your symptoms worsen or you develop new symptoms please let us know.  Please keep her follow-ups with your primary care provider.  Thank you for coming to see [place] at [facility]. I hope we have been able to provide you high quality care today.  You may receive a patient satisfaction survey over the next few weeks. We would appreciate your feedback and comments so that we may continue to improve ourselves and the health of our patients.

## 2017-01-27 NOTE — Progress Notes (Signed)
PATIENT: [name] DOB: 11/17/1923  REASON FOR VISIT: follow up- history of stroke HISTORY FROM: patient  HISTORY OF PRESENT ILLNESS: Today 01/27/17 [name] is a 83 year old male with a history of a right MCA territory infarct. He returns today for follow-up. The patient remains on Xarelto and is tolerating it well. Denies any significant bruising or bleeding. Primary care continues to manage his blood pressure and cholesterol. Patient's blood pressure is slightly low today. The patient controls his diabetes with diet. Overall the patient has continued to do well. He suffered no residual effects after his stroke. He denies any additional strokelike symptoms. He continues to live at home alone. He is able to complete ADLs independently. He returns today for an evaluation.  HISTORY 06/10/15: [name] is an 83 year old male with a history of stroke. He returns today for follow-up. The patient is currently on Xarelto and tolerating it well. Denies any significant bruising or bleeding. The patient's blood pressure is in normal range today. Reports that his primary care  checks his cholesterol and diabetes. Reports that he was able to go off of his diabetes medication. He states that he did not have any residual symptoms after his stroke. He feels that he did have a stroke because he was temporarily off of Xarelto. He does feel that he has a poor appetite. He lives at home alone. Able to complete all ADLs independently. He returns today for an evaluation.  HISTORY 09/25/15 (sethi): [name] is a 69 year Caucasian male seen today for first office f/u visit after [place] for stroke in March 2017. [name] is an 83 y.o. male patient who was brought into the emergency room via EMS with symptoms of severe dysarthria, right gaze deviation and left hemiplegia. Patient was at church when symptoms started. Time of onset was reportedly at 12 PM on 07/23/15, witnessed by other people at  church. Patient unable to provide history during the initial evaluation (LKW 07/23/2015 at 12 noon). His level of alertness, and dysarthria improved gradually with time values in the ER. Then he was able to provide more history. He reported taking his last dose of Xarelto David before yesterday at 6 PM, he missed only one dose of Xarelto yesterday. Patient was not administered IV t-PA secondary to being on xarelto (last dose 42 hours ago). CT angio showed clot right M1, but given significant clinical improvement and risk of procedure, after discussion with family and Dr. Rowe Robert was made to hold off on neuro intervention. He was admitted for further evaluation and treatment. Transthoracic echo showed normal ejection fraction without cardiac source of embolism. CT perfusion scan showed infarct in the right MCA territory with sparing of the basal ganglia. Cineangiogram of the neck showed no significant extracranial stenosis. LDL cholesterol was 68 mg percent and hemoglobin A1c was 6.5. Patient was started on Xarelto for stroke prevention. He was seen by physical occupational speech therapy. Patient did well and made a full recovery. He states his remains on Xarelto is tolerating well without bleeding or bruising. His blood pressure is well controlled and today it is 104/52. He's had some diminished appetite and has lost a few pounds. He has an upcoming appointment with his primary physician later this week and plans to discuss this with him. He has no new neurological complaints. His return back to all activities of daily living.    REVIEW OF SYSTEMS: Out of a complete 14 system review of symptoms, the patient complains  only of the following symptoms, and all other reviewed systems are negative.  ALLERGIES: No Known Allergies  HOME MEDICATIONS: Outpatient Medications Prior to Visit  Medication Sig Dispense Refill  . bisacodyl (BISACODYL) 5 MG EC tablet Take 5 mg by mouth daily as needed for  moderate constipation.    . bisacodyl (DULCOLAX) 10 MG suppository Place 10 mg rectally daily as needed for moderate constipation.    . cyproheptadine (PERIACTIN) 4 MG tablet Take 4 mg by mouth at bedtime.    . feeding supplement (ENSURE CLINICAL STRENGTH) LIQD Take 237 mLs by mouth 3 (three) times daily as needed. Food supplement    . furosemide (LASIX) 20 MG tablet Take 1 tablet (20 mg total) by mouth daily. 90 tablet 3  . metoprolol succinate (TOPROL-XL) 50 MG 24 hr tablet Take 50 mg by mouth every evening.    . pantoprazole (PROTONIX) 40 MG tablet Take 1 tablet (40 mg total) by mouth daily. 30 tablet 11  . rivaroxaban (XARELTO) 15 MG TABS tablet Take 1 tablet (15 mg total) by mouth daily with supper. 30 tablet 3   No facility-administered medications prior to visit.     PAST MEDICAL HISTORY: Past Medical History:  Diagnosis Date  . Arthritis   . Atrial fibrillation ([place])   . CKD (chronic kidney disease)   . History of hiatal hernia   . Hx SBO    Last 2013 all treated conservatively  . Hypertension   . Presence of permanent cardiac pacemaker   . Prostate cancer ([place])   . Stroke [facility]) 2017   left facial drooping noted 08/14/2016  . Tachycardia-bradycardia syndrome ([place])   . Type II diabetes mellitus ([place])   . Ventral hernia    x3    PAST SURGICAL HISTORY: Past Surgical History:  Procedure Laterality Date  . ABDOMINAL AORTOGRAM W/LOWER EXTREMITY N/A 08/07/2016   Procedure: Abdominal Aortogram w/Lower Extremity;  Surgeon: Wellington Hampshire, MD;  Location: [place] [facility];  Service: Cardiovascular;  Laterality: N/A;  . CHOLECYSTECTOMY OPEN  03/31/2001   Dr Lindon Romp  . COLON SURGERY    . EXPLORATORY LAPAROTOMY  08/2004   Archie Endo 10/01/2010  . HEMORRHOID SURGERY    . HERNIA REPAIR    . HIATAL HERNIA REPAIR  2002  . INCISIONAL HERNIA REPAIR  08/2004   Archie Endo 10/01/2010  . INSERT / REPLACE / REMOVE PACEMAKER    . PERIPHERAL VASCULAR BALLOON ANGIOPLASTY  08/07/2016   Procedure:  Peripheral Vascular Balloon Angioplasty;  Surgeon: Wellington Hampshire, MD;  Location: [place] [facility];  Service: Cardiovascular;;  left popliteal artery  . PERIPHERAL VASCULAR INTERVENTION  08/14/2016   popliteal artery/notes 08/14/2016  . PERIPHERAL VASCULAR INTERVENTION Left 08/14/2016   Procedure: Peripheral Vascular Intervention;  Surgeon: Wellington Hampshire, MD;  Location: [place] [facility];  Service: Cardiovascular;  Laterality: Left;  popliteal artery  . PERMANENT PACEMAKER GENERATOR CHANGE N/A 04/06/2012   Procedure: PERMANENT PACEMAKER GENERATOR CHANGE;  Surgeon: Evans Lance, MD;  Location: [facility];  Service: Cardiovascular;  Laterality: N/A;  . SMALL INTESTINE SURGERY  09/16/2004   SBO resection, VH repair    FAMILY HISTORY: Family History  Problem Relation Age of Onset  . Pneumonia Father   . Stroke Brother   . Stroke Sister   . Cancer Brother        type unknown    SOCIAL HISTORY: Social History   Social History  . Marital status: Widowed    Spouse name: N/A  .  Number of children: N/A  . Years of education: N/A   Occupational History  . Not on file.   Social History Main Topics  . Smoking status: Former Smoker    Packs/day: 1.50    Years: 15.00    Quit date: 04/16/1955  . Smokeless tobacco: Former Systems developer    Types: Chew    Quit date: 08/17/1952  . Alcohol use No     Comment: 08/14/2016 "drank beer when I was a teenager"  . Drug use: No  . Sexual activity: Not on file   Other Topics Concern  . Not on file   Social History Narrative  . No narrative on file      PHYSICAL EXAM  Vitals:   01/27/17 0837  BP: (!) 87/55  Pulse: 69  Weight: 144 lb 3.2 oz (65.4 kg)   Body mass index is 20.11 kg/m.  Generalized: Well developed, in no acute distress   Neurological examination  Mentation: Alert oriented to time, place, history taking. Follows all commands speech and language fluent Cranial nerve II-XII: Pupils were equal round reactive to light.  Extraocular movements were full, visual field were full on confrontational test. Facial sensation and strength were normal. Uvula tongue midline. Head turning and shoulder shrug  were normal and symmetric. Motor: The motor testing reveals 5 over 5 strength of all 4 extremities. Good symmetric motor tone is noted throughout.  Sensory: Sensory testing is intact to soft touch on all 4 extremities. No evidence of extinction is noted.  Coordination: Cerebellar testing reveals good finger-nose-finger and heel-to-shin bilaterally.  Gait and station: Gait is normal. Tandem gait is normal. Romberg is negative. No drift is seen.  Reflexes: Deep tendon reflexes are symmetric and normal bilaterally.   DIAGNOSTIC DATA (LABS, IMAGING, TESTING) - I reviewed patient records, labs, notes, testing and imaging myself where available.  Lab Results  Component Value Date   WBC 6.2 01/17/2017   HGB 10.4 (L) 01/17/2017   HCT 29.8 (L) 01/17/2017   MCV 74.5 (L) 01/17/2017   PLT 106 (L) 01/17/2017      Component Value Date/Time   NA 137 01/17/2017 0538   NA 141 11/08/2016 1100   K 4.7 01/17/2017 0538   CL 113 (H) 01/17/2017 0538   CO2 18 (L) 01/17/2017 0538   GLUCOSE 102 (H) 01/17/2017 0538   BUN 16 01/17/2017 0538   BUN 19 11/08/2016 1100   CREATININE 1.41 (H) 01/17/2017 0538   CREATININE 1.58 (H) 07/30/2016 1059   CALCIUM 7.8 (L) 01/17/2017 0538   PROT 8.1 01/11/2017 1954   ALBUMIN 4.6 01/11/2017 1954   AST 25 01/11/2017 1954   ALT 9 (L) 01/11/2017 1954   ALKPHOS 79 01/11/2017 1954   BILITOT 1.1 01/11/2017 1954   GFRNONAA 42 (L) 01/17/2017 0538   GFRAA 49 (L) 01/17/2017 0538   Lab Results  Component Value Date   CHOL 122 07/23/2015   HDL 43 07/23/2015   LDLCALC 68 07/23/2015   TRIG 55 07/23/2015   CHOLHDL 2.8 07/23/2015   Lab Results  Component Value Date   HGBA1C 6.7 (H) 01/13/2017   No results found for: [id number] Lab Results  Component Value Date   TSH 0.811 04/07/2010       ASSESSMENT AND PLAN 83 y.o. year old male  has a past medical history of Arthritis; Atrial fibrillation ([place]); CKD (chronic kidney disease); History of hiatal hernia; SBO; Hypertension; Presence of permanent cardiac pacemaker; Prostate cancer ([place]); Stroke [facility]) (2017); Tachycardia-bradycardia syndrome ([place]); Type II  diabetes mellitus ([place]); and Ventral hernia. here with:  1. History of stroke- right MCA territory  Overall the patient has done well since his stroke. He is encouraged to continue on Xarelto for stroke prevention. He should maintain strict control of his blood pressure with goal less than 130/90, cholesterol LDL less than 70 and hemoglobin A1c less than 6.5%. Patient's blood pressure was low in office today. He will need to monitor this. He is advised that if he has any strokelike symptoms he should call 911. Since the patient has done well since his stroke we will release him back to primary care. He can follow up with our office on an as needed basis.  I spent 15 minutes with the patient. 50% of this time was spent discussing stroke risk factors and prevention.    Ward Givens, MSN, NP-C 01/27/2017, 8:36 AM [facility] [address] [place], [place] [address] [phone]

## 2017-03-13 ENCOUNTER — Other Ambulatory Visit: Payer: Self-pay | Admitting: *Deleted

## 2017-03-13 DIAGNOSIS — I739 Peripheral vascular disease, unspecified: Secondary | ICD-10-CM

## 2017-03-20 ENCOUNTER — Ambulatory Visit (INDEPENDENT_AMBULATORY_CARE_PROVIDER_SITE_OTHER): Payer: Medicare Other | Admitting: *Deleted

## 2017-03-20 DIAGNOSIS — I495 Sick sinus syndrome: Secondary | ICD-10-CM

## 2017-03-21 ENCOUNTER — Encounter: Payer: Self-pay | Admitting: Cardiology

## 2017-03-21 NOTE — Progress Notes (Signed)
Remote pacemaker transmission.   

## 2017-03-31 ENCOUNTER — Ambulatory Visit (HOSPITAL_COMMUNITY)
Admission: RE | Admit: 2017-03-31 | Discharge: 2017-03-31 | Disposition: A | Discharge status: Home / Self Care | Payer: Medicare Other | Source: Ambulatory Visit | Attending: Cardiology | Admitting: Cardiology

## 2017-03-31 DIAGNOSIS — I739 Peripheral vascular disease, unspecified: Secondary | ICD-10-CM | POA: Diagnosis not present

## 2017-03-31 DIAGNOSIS — I743 Embolism and thrombosis of arteries of the lower extremities: Secondary | ICD-10-CM | POA: Insufficient documentation

## 2017-03-31 DIAGNOSIS — Z95820 Peripheral vascular angioplasty status with implants and grafts: Secondary | ICD-10-CM | POA: Diagnosis not present

## 2017-04-08 ENCOUNTER — Encounter: Payer: Self-pay | Admitting: *Deleted

## 2017-04-08 NOTE — Telephone Encounter (Signed)
This encounter was created in error - please disregard.

## 2017-04-16 LAB — CUP PACEART REMOTE DEVICE CHECK
Battery Impedance: 1840 Ohm
Brady Statistic RV Percent Paced: 59 %
Date Time Interrogation Session: 20181101231650
Lead Channel Impedance Value: 0 Ohm
Lead Channel Impedance Value: 461 Ohm
Lead Channel Sensing Intrinsic Amplitude: 16 mV
Lead Channel Setting Pacing Amplitude: 2.5 V
Lead Channel Setting Pacing Pulse Width: 0.4 ms
MDC IDC LEAD IMPLANT DT: 20060510
MDC IDC LEAD LOCATION: 753860
MDC IDC MSMT BATTERY REMAINING LONGEVITY: 39 mo
MDC IDC MSMT BATTERY VOLTAGE: 2.76 V
MDC IDC MSMT LEADCHNL RV PACING THRESHOLD AMPLITUDE: 1 V
MDC IDC MSMT LEADCHNL RV PACING THRESHOLD PULSEWIDTH: 0.4 ms
MDC IDC PG IMPLANT DT: 20131118
MDC IDC SET LEADCHNL RV SENSING SENSITIVITY: 5.6 mV

## 2017-04-29 ENCOUNTER — Ambulatory Visit: Payer: Medicare Other | Admitting: Cardiovascular Disease

## 2017-05-06 ENCOUNTER — Ambulatory Visit: Payer: Medicare Other | Admitting: Cardiovascular Disease

## 2017-05-06 ENCOUNTER — Encounter: Payer: Self-pay | Admitting: Cardiovascular Disease

## 2017-05-06 VITALS — BP 130/82 | HR 76 | Ht 71.0 in | Wt 156.0 lb

## 2017-05-06 DIAGNOSIS — I739 Peripheral vascular disease, unspecified: Secondary | ICD-10-CM | POA: Diagnosis not present

## 2017-05-06 DIAGNOSIS — I482 Chronic atrial fibrillation, unspecified: Secondary | ICD-10-CM

## 2017-05-06 NOTE — Patient Instructions (Signed)
Medication Instructions:   NO CHANGE  Testing/Procedures:  Your physician has requested that you have a lower extremity arterial duplex. During this test, and ultrasound are used to evaluate arterial blood flow in the legs. Allow one hour for this exam. There are no restrictions or special instructions. NEEDED IN 6 MONTHS PRIOR TO APPOINTMENT  Follow-Up:  Your physician wants you to follow-up in: [place] will receive a reminder letter in the mail two months in advance. If you don't receive a letter, please call our office to schedule the follow-up appointment.

## 2017-05-06 NOTE — Progress Notes (Signed)
Cardiology Office Note   Date:  05/06/2017   ID:  [name], DOB [birth year], MRN [id number]  PCP:  Foye Spurling, MD  Cardiologist:  Dr. Lovena Le Referring physician: Dr. Blima Dessert.  Chief Complaint  Patient presents with  . Follow-up    pt denied chest pain       History of Present Illness: [name] is a 83 y.o. male who is here today for a follow-up visit regarding peripheral arterial disease.The patient has known history of chronic atrial fibrillation on long-term anticoagulation with Xarelto, symptomatic bradycardia status post permanent pacemaker placement, previous stroke one day after running out of Xarelto and diet-controlled diabetes. He was seen in early this year for ulceration on the left distal fourth toe with painful dark discoloration.  He underwent vascular studies in our office which showed normal ABI on the right side and mildly reduced on the left side at 0.93. Duplex showed occlusion of the distal left popliteal artery and tibial peroneal trunk. Angiography in March showed no significant aortoiliac disease: Occluded left popliteal artery with reconstitution in the distal TP trunk with patent posterior tibial and peroneal arteries. Attempted antegrade angioplasty of the left popliteal artery was not successful due to inability to cross the occlusion intraluminally. The patient was brought back a week later and underwent successful complex endovascular intervention of the occluded left popliteal artery and TP trunk using a retrograde access via the left posterior tibial artery. He has been doing well since then with no significant claudication or ulceration.  ABI was normal last month with patent stents.  He does complain of numbness involving the bottom of the left foot.  He had hospitalization in June for congestive heart failure and in August for small bowel obstruction.   Past Medical History:  Diagnosis Date  . Arthritis   . Atrial  fibrillation ([place])   . CKD (chronic kidney disease)   . History of hiatal hernia   . Hx SBO    Last 2013 all treated conservatively  . Hypertension   . Presence of permanent cardiac pacemaker   . Prostate cancer ([place])   . Stroke [facility]) 2017   left facial drooping noted 08/14/2016  . Tachycardia-bradycardia syndrome ([place])   . Type II diabetes mellitus ([place])   . Ventral hernia    x3    Past Surgical History:  Procedure Laterality Date  . ABDOMINAL AORTOGRAM W/LOWER EXTREMITY N/A 08/07/2016   Procedure: Abdominal Aortogram w/Lower Extremity;  Surgeon: Wellington Hampshire, MD;  Location: [place] [facility];  Service: Cardiovascular;  Laterality: N/A;  . CHOLECYSTECTOMY OPEN  03/31/2001   Dr Lindon Romp  . COLON SURGERY    . EXPLORATORY LAPAROTOMY  08/2004   Archie Endo 10/01/2010  . HEMORRHOID SURGERY    . HERNIA REPAIR    . HIATAL HERNIA REPAIR  2002  . INCISIONAL HERNIA REPAIR  08/2004   Archie Endo 10/01/2010  . INSERT / REPLACE / REMOVE PACEMAKER    . PERIPHERAL VASCULAR BALLOON ANGIOPLASTY  08/07/2016   Procedure: Peripheral Vascular Balloon Angioplasty;  Surgeon: Wellington Hampshire, MD;  Location: [place] [facility];  Service: Cardiovascular;;  left popliteal artery  . PERIPHERAL VASCULAR INTERVENTION  08/14/2016   popliteal artery/notes 08/14/2016  . PERIPHERAL VASCULAR INTERVENTION Left 08/14/2016   Procedure: Peripheral Vascular Intervention;  Surgeon: Wellington Hampshire, MD;  Location: [place] [facility];  Service: Cardiovascular;  Laterality: Left;  popliteal artery  . PERMANENT PACEMAKER GENERATOR CHANGE N/A 04/06/2012  Procedure: PERMANENT PACEMAKER GENERATOR CHANGE;  Surgeon: Evans Lance, MD;  Location: [facility] CATH LAB;  Service: Cardiovascular;  Laterality: N/A;  . SMALL INTESTINE SURGERY  09/16/2004   SBO resection, VH repair     Current Outpatient Medications  Medication Sig Dispense Refill  . bisacodyl (BISACODYL) 5 MG EC tablet Take 5 mg by mouth daily as needed for moderate  constipation.    . bisacodyl (DULCOLAX) 10 MG suppository Place 10 mg rectally daily as needed for moderate constipation.    . cyproheptadine (PERIACTIN) 4 MG tablet Take 4 mg by mouth at bedtime.    . feeding supplement (ENSURE CLINICAL STRENGTH) LIQD Take 237 mLs by mouth 3 (three) times daily as needed. Food supplement    . furosemide (LASIX) 20 MG tablet Take 1 tablet (20 mg total) by mouth daily. 90 tablet 3  . metoprolol succinate (TOPROL-XL) 50 MG 24 hr tablet Take 50 mg by mouth every evening.    . pantoprazole (PROTONIX) 40 MG tablet Take 1 tablet (40 mg total) by mouth daily. 30 tablet 11  . rivaroxaban (XARELTO) 15 MG TABS tablet Take 1 tablet (15 mg total) by mouth daily with supper. 30 tablet 3   No current facility-administered medications for this visit.     Allergies:   Patient has no known allergies.    Social History:  The patient  reports that he quit smoking about 62 years ago. He has a 22.50 pack-year smoking history. He quit smokeless tobacco use about 64 years ago. His smokeless tobacco use included chew. He reports that he does not drink alcohol or use drugs.   Family History:  The patient's family history includes Cancer in his brother; Pneumonia in his father; Stroke in his brother and sister.    ROS:  Please see the history of present illness.   Otherwise, review of systems are positive for none.   All other systems are reviewed and negative.    PHYSICAL EXAM: VS:  BP 130/82   Pulse 76   Ht 5\' 11"  (1.803 m)   Wt 156 lb (70.8 kg)   SpO2 98%   BMI 21.76 kg/m  , BMI Body mass index is 21.76 kg/m. GEN: Well nourished, well developed, in no acute distress  HEENT: normal  Neck: no JVD, carotid bruits, or masses Cardiac: RRR; no murmurs, rubs, or gallops,no edema  Respiratory:  clear to auscultation bilaterally, normal work of breathing GI: soft, nontender, nondistended, + BS MS: no deformity or atrophy  Skin: warm and dry, no rash Neuro:  Strength and  sensation are intact Psych: euthymic mood, full affect Vascular:   Posterior tibial pulse is palpable on the left side.  No ulceration.  EKG:  EKG is not ordered today.    Recent Labs: 11/02/2016: B Natriuretic Peptide 288.0 01/11/2017: ALT 9 01/15/2017: Magnesium 1.8 01/17/2017: BUN 16; Creatinine, Ser 1.41; Hemoglobin 10.4; Platelets 106; Potassium 4.7; Sodium 137    Lipid Panel    Component Value Date/Time   CHOL 122 07/23/2015 1254   TRIG 55 07/23/2015 1254   HDL 43 07/23/2015 1254   CHOLHDL 2.8 07/23/2015 1254   VLDL 11 07/23/2015 1254   LDLCALC 68 07/23/2015 1254      Wt Readings from Last 3 Encounters:  05/06/17 156 lb (70.8 kg)  01/27/17 144 lb 3.2 oz (65.4 kg)  01/12/17 147 lb 14.9 oz (67.1 kg)       No flowsheet data found.    ASSESSMENT AND PLAN:  1.  Peripheral arterial disease with previous critical limb ischemia status post successful complex endovascular intervention of the occluded left popliteal artery and TP trunk with placement of 2 stents.  He continues to have palpable posterior tibial pulse on the left side and duplex showed patent stents with normal ABI.  Repeat studies in 6 months.  2. Chronic atrial fibrillation: His rate is controlled and he is on long-term anticoagulation with Xarelto.   Disposition:   FU with me in 6 months  Signed,  Kathlyn Sacramento, MD  05/06/2017 9:06 AM    [place]

## 2017-05-07 ENCOUNTER — Other Ambulatory Visit: Payer: Self-pay | Admitting: Cardiovascular Disease

## 2017-05-07 DIAGNOSIS — I739 Peripheral vascular disease, unspecified: Secondary | ICD-10-CM

## 2017-06-19 ENCOUNTER — Ambulatory Visit (INDEPENDENT_AMBULATORY_CARE_PROVIDER_SITE_OTHER): Payer: Medicare Other | Admitting: *Deleted

## 2017-06-19 ENCOUNTER — Telehealth: Payer: Self-pay | Admitting: Cardiology

## 2017-06-19 DIAGNOSIS — I495 Sick sinus syndrome: Secondary | ICD-10-CM | POA: Diagnosis not present

## 2017-06-19 NOTE — Telephone Encounter (Signed)
Spoke with pt and reminded pt of remote transmission that is due today. Pt verbalized understanding.   

## 2017-06-20 ENCOUNTER — Encounter: Payer: Self-pay | Admitting: Cardiology

## 2017-06-20 NOTE — Progress Notes (Signed)
Remote pacemaker transmission.   

## 2017-07-02 ENCOUNTER — Encounter: Payer: Medicare Other | Admitting: Internal Medicine

## 2017-07-02 ENCOUNTER — Ambulatory Visit: Payer: Medicare Other | Admitting: Internal Medicine

## 2017-07-02 ENCOUNTER — Encounter: Payer: Self-pay | Admitting: Internal Medicine

## 2017-07-02 VITALS — BP 115/77 | HR 77 | Ht 71.0 in | Wt 155.6 lb

## 2017-07-02 DIAGNOSIS — I482 Chronic atrial fibrillation, unspecified: Secondary | ICD-10-CM

## 2017-07-02 DIAGNOSIS — Z950 Presence of cardiac pacemaker: Secondary | ICD-10-CM | POA: Diagnosis not present

## 2017-07-02 DIAGNOSIS — I495 Sick sinus syndrome: Secondary | ICD-10-CM

## 2017-07-02 NOTE — Progress Notes (Signed)
HPI [name] returns today for ongoing followup of his atrial fib, symptomatic bradycardia due to complete heart block, s/p PPM insertion. He has noted some weight loss over the last year though when I look back in his record, he has lost 8 lbs in 4 years. He is fairly active. He has not had syncope and no recent falls. He has had a remote stroke with some residual and has tolerated systemic anti-coagulation. No Known Allergies   Current Outpatient Medications  Medication Sig Dispense Refill  . bisacodyl (BISACODYL) 5 MG EC tablet Take 5 mg by mouth daily as needed for moderate constipation.    . bisacodyl (DULCOLAX) 10 MG suppository Place 10 mg rectally daily as needed for moderate constipation.    . cyproheptadine (PERIACTIN) 4 MG tablet Take 4 mg by mouth at bedtime.    . feeding supplement (ENSURE CLINICAL STRENGTH) LIQD Take 237 mLs by mouth 3 (three) times daily as needed. Food supplement    . furosemide (LASIX) 20 MG tablet Take 1 tablet (20 mg total) by mouth daily. 90 tablet 3  . metoprolol succinate (TOPROL-XL) 50 MG 24 hr tablet Take 50 mg by mouth every evening.    . pantoprazole (PROTONIX) 40 MG tablet Take 1 tablet (40 mg total) by mouth daily. 30 tablet 11  . rivaroxaban (XARELTO) 15 MG TABS tablet Take 1 tablet (15 mg total) by mouth daily with supper. 30 tablet 3   No current facility-administered medications for this visit.      Past Medical History:  Diagnosis Date  . Arthritis   . Atrial fibrillation ([place])   . CKD (chronic kidney disease)   . History of hiatal hernia   . Hx SBO    Last 2013 all treated conservatively  . Hypertension   . Presence of permanent cardiac pacemaker   . Prostate cancer ([place])   . Stroke [facility]) 2017   left facial drooping noted 08/14/2016  . Tachycardia-bradycardia syndrome ([place])   . Type II diabetes mellitus ([place])   . Ventral hernia    x3    ROS:   All systems reviewed and negative except as noted in the HPI.   Past  Surgical History:  Procedure Laterality Date  . ABDOMINAL AORTOGRAM W/LOWER EXTREMITY N/A 08/07/2016   Procedure: Abdominal Aortogram w/Lower Extremity;  Surgeon: Wellington Hampshire, MD;  Location: [place] [facility];  Service: Cardiovascular;  Laterality: N/A;  . CHOLECYSTECTOMY OPEN  03/31/2001   Dr Lindon Romp  . COLON SURGERY    . EXPLORATORY LAPAROTOMY  08/2004   Archie Endo 10/01/2010  . HEMORRHOID SURGERY    . HERNIA REPAIR    . HIATAL HERNIA REPAIR  2002  . INCISIONAL HERNIA REPAIR  08/2004   Archie Endo 10/01/2010  . INSERT / REPLACE / REMOVE PACEMAKER    . PERIPHERAL VASCULAR BALLOON ANGIOPLASTY  08/07/2016   Procedure: Peripheral Vascular Balloon Angioplasty;  Surgeon: Wellington Hampshire, MD;  Location: [place] [facility];  Service: Cardiovascular;;  left popliteal artery  . PERIPHERAL VASCULAR INTERVENTION  08/14/2016   popliteal artery/notes 08/14/2016  . PERIPHERAL VASCULAR INTERVENTION Left 08/14/2016   Procedure: Peripheral Vascular Intervention;  Surgeon: Wellington Hampshire, MD;  Location: [place] [facility];  Service: Cardiovascular;  Laterality: Left;  popliteal artery  . PERMANENT PACEMAKER GENERATOR CHANGE N/A 04/06/2012   Procedure: PERMANENT PACEMAKER GENERATOR CHANGE;  Surgeon: Evans Lance, MD;  Location: [facility] C[facility] LAB;  Service: Cardiovascular;  Laterality: N/A;  . SMALL INTESTINE  SURGERY  09/16/2004   SBO resection, VH repair     Family History  Problem Relation Age of Onset  . Pneumonia Father   . Stroke Brother   . Stroke Sister   . Cancer Brother        type unknown     Social History   Socioeconomic History  . Marital status: Widowed    Spouse name: Not on file  . Number of children: Not on file  . Years of education: Not on file  . Highest education level: Not on file  Social Needs  . Financial resource strain: Not on file  . Food insecurity - worry: Not on file  . Food insecurity - inability: Not on file  . Transportation needs - medical: Not on file  .  Transportation needs - non-medical: Not on file  Occupational History  . Not on file  Tobacco Use  . Smoking status: Former Smoker    Packs/day: 1.50    Years: 15.00    Pack years: 22.50    Last attempt to quit: 04/16/1955    Years since quitting: 62.2  . Smokeless tobacco: Former Systems developer    Types: [place] date: 08/17/1952  Substance and Sexual Activity  . Alcohol use: No    Comment: 08/14/2016 "drank beer when I was a teenager"  . Drug use: No  . Sexual activity: Not on file  Other Topics Concern  . Not on file  Social History Narrative   Lives alone     BP 115/77   Pulse 77   Ht 5\' 11"  (1.803 m)   Wt 155 lb 9.6 oz (70.6 kg)   BMI 21.70 kg/m   Physical Exam:  Well appearing elderly man, NAD HEENT: Unremarkable Neck:  6 cm JVD, no thyromegally Lymphatics:  No adenopathy Back:  No CVA tenderness Lungs:  Clear with no wheezes HEART:  Regular rate rhythm, no murmurs, no rubs, no clicks Abd:  soft, positive bowel sounds, no organomegally, no rebound, no guarding Ext:  2 plus pulses, no edema, no cyanosis, no clubbing Skin:  No rashes no nodules Neuro:  CN II through XII intact, motor grossly intact  EKG - none  DEVICE  Normal device function.  See PaceArt for details.   Assess/Plan: 1. Atrial fib - his ventricular rate is well controlled. He will continue his [place]. 2. PPM - his medtronic single chamber PPM is working normally. Will recheck in several months. 3. HTN - his blood pressure is well controlled.  4. Peripheral artery disease - he is followed by Dr. Fletcher Anon. He is s/p stenting of his popliteal artery on the left and his claudication appears to be resolved.  Mikle Bosworth.D.

## 2017-07-02 NOTE — Patient Instructions (Signed)
Medication Instructions:  Your physician recommends that you continue on your current medications as directed. Please refer to the Current Medication list given to you today.  Labwork: None ordered.  Testing/Procedures: None ordered.  Follow-Up: Your physician wants you to follow-up in: one year with Dr. Lovena Le.   You will receive a reminder letter in the mail two months in advance. If you don't receive a letter, please call our office to schedule the follow-up appointment.  Remote monitoring is used to monitor your Pacemaker from home. This monitoring reduces the number of office visits required to check your device to one time per year. It allows [place] to keep an eye on the functioning of your device to ensure it is working properly. You are scheduled for a device check from home on 09/18/2017. You may send your transmission at any time that day. If you have a wireless device, the transmission will be sent automatically. After your physician reviews your transmission, you will receive a postcard with your next transmission date.  Any Other Special Instructions Will Be Listed Below (If Applicable).  If you need a refill on your cardiac medications before your next appointment, please call your pharmacy.

## 2017-07-04 LAB — CUP PACEART INCLINIC DEVICE CHECK
Battery Remaining Longevity: 35 mo
Brady Statistic RV Percent Paced: 65 %
Date Time Interrogation Session: 20190213215620
Implantable Lead Location: 753860
Lead Channel Impedance Value: 0 Ohm
Lead Channel Impedance Value: 452 Ohm
Lead Channel Pacing Threshold Amplitude: 1 V
Lead Channel Sensing Intrinsic Amplitude: 15.67 mV
Lead Channel Setting Pacing Amplitude: 2.5 V
MDC IDC LEAD IMPLANT DT: 20060510
MDC IDC MSMT BATTERY IMPEDANCE: 2013 Ohm
MDC IDC MSMT BATTERY VOLTAGE: 2.76 V
MDC IDC MSMT LEADCHNL RV PACING THRESHOLD PULSEWIDTH: 0.4 ms
MDC IDC PG IMPLANT DT: 20131118
MDC IDC SET LEADCHNL RV PACING PULSEWIDTH: 0.4 ms
MDC IDC SET LEADCHNL RV SENSING SENSITIVITY: 5.6 mV

## 2017-07-04 LAB — CUP PACEART REMOTE DEVICE CHECK
Battery Impedance: 2042 Ohm
Battery Remaining Longevity: 35 mo
Battery Voltage: 2.75 V
Date Time Interrogation Session: 20190131200224
Implantable Lead Location: 753860
Implantable Lead Model: 5076
Implantable Pulse Generator Implant Date: 20131118
Lead Channel Impedance Value: 0 Ohm
Lead Channel Pacing Threshold Pulse Width: 0.4 ms
Lead Channel Setting Pacing Amplitude: 2.5 V
Lead Channel Setting Sensing Sensitivity: 5.6 mV
MDC IDC LEAD IMPLANT DT: 20060510
MDC IDC MSMT LEADCHNL RV IMPEDANCE VALUE: 461 Ohm
MDC IDC MSMT LEADCHNL RV PACING THRESHOLD AMPLITUDE: 0.875 V
MDC IDC SET LEADCHNL RV PACING PULSEWIDTH: 0.4 ms
MDC IDC STAT BRADY RV PERCENT PACED: 65 %

## 2017-08-11 ENCOUNTER — Other Ambulatory Visit: Payer: Self-pay

## 2017-08-11 MED ORDER — RIVAROXABAN 15 MG PO TABS: 15.0000 mg | ORAL_TABLET | Freq: Every day | ORAL | 3 refills | Status: DC

## 2017-08-14 DIAGNOSIS — H9312 Tinnitus, left ear: Secondary | ICD-10-CM | POA: Insufficient documentation

## 2017-08-14 DIAGNOSIS — H903 Sensorineural hearing loss, bilateral: Secondary | ICD-10-CM | POA: Insufficient documentation

## 2017-09-03 ENCOUNTER — Ambulatory Visit: Payer: Medicare Other | Admitting: Family Medicine

## 2017-09-03 ENCOUNTER — Encounter: Payer: Self-pay | Admitting: Family Medicine

## 2017-09-03 ENCOUNTER — Telehealth: Payer: Self-pay

## 2017-09-03 VITALS — BP 122/80 | HR 66 | Ht 71.0 in | Wt 152.5 lb

## 2017-09-03 DIAGNOSIS — E119 Type 2 diabetes mellitus without complications: Secondary | ICD-10-CM | POA: Diagnosis not present

## 2017-09-03 DIAGNOSIS — J301 Allergic rhinitis due to pollen: Secondary | ICD-10-CM

## 2017-09-03 DIAGNOSIS — I10 Essential (primary) hypertension: Secondary | ICD-10-CM | POA: Diagnosis not present

## 2017-09-03 DIAGNOSIS — I480 Paroxysmal atrial fibrillation: Secondary | ICD-10-CM

## 2017-09-03 LAB — BASIC METABOLIC PANEL
BUN: 24 mg/dL — ABNORMAL HIGH (ref 6–23)
CALCIUM: 8.8 mg/dL (ref 8.4–10.5)
CO2: 23 meq/L (ref 19–32)
Chloride: 109 mEq/L (ref 96–112)
Creatinine, Ser: 1.55 mg/dL — ABNORMAL HIGH (ref 0.40–1.50)
GFR: 54.26 mL/min — ABNORMAL LOW (ref >60.00)
Glucose, Bld: 93 mg/dL (ref 70–99)
Potassium: 5.4 mEq/L — ABNORMAL HIGH (ref 3.5–5.1)
SODIUM: 140 meq/L (ref 135–145)

## 2017-09-03 LAB — URINALYSIS, ROUTINE W REFLEX MICROSCOPIC
BILIRUBIN URINE: NEGATIVE
HGB URINE DIPSTICK: NEGATIVE
KETONES UR: NEGATIVE
LEUKOCYTES UA: NEGATIVE
NITRITE: NEGATIVE
RBC / HPF: NONE SEEN (ref 0–?)
SPECIFIC GRAVITY, URINE: 1.01 (ref 1.000–1.030)
Total Protein, Urine: NEGATIVE
URINE GLUCOSE: NEGATIVE
UROBILINOGEN UA: 0.2 (ref 0.0–1.0)
pH: 6 (ref 5.0–8.0)

## 2017-09-03 LAB — CBC
HCT: 34.8 % — ABNORMAL LOW (ref 39.0–52.0)
Hemoglobin: 11.2 g/dL — ABNORMAL LOW (ref 13.0–17.0)
MCHC: 32.1 g/dL (ref 30.0–36.0)
MCV: 80 fl (ref 78.0–100.0)
PLATELETS: 136 10*3/uL — AB (ref 150.0–400.0)
RBC: 4.35 Mil/uL (ref 4.22–5.81)
RDW: 18.2 % — ABNORMAL HIGH (ref 11.5–15.5)
WBC: 5.8 10*3/uL (ref 4.0–10.5)

## 2017-09-03 MED ORDER — LORATADINE 10 MG PO TABS: 10.0000 mg | ORAL_TABLET | Freq: Every day | ORAL | 1 refills | Status: DC | PRN

## 2017-09-03 NOTE — Addendum Note (Signed)
Addended by: Abelino Derrick A on: 09/03/2017 11:56 AM   Modules accepted: Orders

## 2017-09-03 NOTE — Telephone Encounter (Signed)
I called the pharmacy and they will get patient's Xarelto ready for him. I left a voicemail for patient's granddaughter letting her know that both Rx's would be ready some time today.

## 2017-09-03 NOTE — Progress Notes (Addendum)
Subjective:  Patient ID: [name], male    DOB: [birth year]  Age: 84 y.o. MRN: [id number]  CC: Establish Care   HPI [name] presents for establishment of care and is accompanied by his grand son-in-law.  He is doing okay.  He lives alone and is able to achieve most of his ADLs.  He is checked on frequently by a daughter and a niece.  He is able to drive himself to church.  Past medical history to include hypertension, paroxysmal A. fib, diet-controlled type 2 diabetes and PAD of his lower extremities.  He denies claudication pain.  Blood pressure is well controlled with his current meds.  He denies hematuria or blood in his stool.  Bowel movements are regular for the most part.  He uses Dulcolax on occasion.  Uses Periactin at night for sleep.  Protonix controls his reflux symptoms.  History [name] has a past medical history of Arthritis, Atrial fibrillation ([place]), CKD (chronic kidney disease), History of hiatal hernia, SBO, Hypertension, Presence of permanent cardiac pacemaker, Prostate cancer ([place]), Stroke ([place]) (2017), Tachycardia-bradycardia syndrome ([place]), Type II diabetes mellitus ([place]), and Ventral hernia.   He has a past surgical history that includes Exploratory laparotomy (08/2004); Small intestine surgery (09/16/2004); Hemorrhoid surgery; Hiatal hernia repair (2002); Permanent pacemaker generator change (N/A, 04/06/2012); ABDOMINAL AORTOGRAM W/LOWER EXTREMITY (N/A, 08/07/2016); PERIPHERAL VASCULAR BALLOON ANGIOPLASTY (08/07/2016); PERIPHERAL VASCULAR INTERVENTION (08/14/2016); Hernia repair; Insert / replace / remove pacemaker; Colon surgery; Cholecystectomy open (03/31/2001); Incisional hernia repair (08/2004); and PERIPHERAL VASCULAR INTERVENTION (Left, 08/14/2016).   His family history includes Cancer in his brother; Pneumonia in his father; Stroke in his brother and sister.He reports that he quit smoking about 62 years ago. He has a 22.50 pack-year smoking history. He quit  smokeless tobacco use about 65 years ago. His smokeless tobacco use included chew. He reports that he does not drink alcohol or use drugs.  Outpatient Medications Prior to Visit  Medication Sig Dispense Refill  . bisacodyl (BISACODYL) 5 MG EC tablet Take 5 mg by mouth daily as needed for moderate constipation.    . bisacodyl (DULCOLAX) 10 MG suppository Place 10 mg rectally daily as needed for moderate constipation.    . cyproheptadine (PERIACTIN) 4 MG tablet Take 4 mg by mouth at bedtime.    . feeding supplement (ENSURE CLINICAL STRENGTH) LIQD Take 237 mLs by mouth 3 (three) times daily as needed. Food supplement    . furosemide (LASIX) 20 MG tablet Take 1 tablet (20 mg total) by mouth daily. 90 tablet 3  . metoprolol succinate (TOPROL-XL) 50 MG 24 hr tablet Take 50 mg by mouth every evening.    . pantoprazole (PROTONIX) 40 MG tablet Take 1 tablet (40 mg total) by mouth daily. 30 tablet 11  . Rivaroxaban (XARELTO) 15 MG TABS tablet Take 1 tablet (15 mg total) by mouth daily with supper. 30 tablet 3   No facility-administered medications prior to visit.     ROS Review of Systems  Constitutional: Negative for chills.  HENT: Positive for postnasal drip and sneezing.   Eyes: Negative for pain and redness.  Respiratory: Negative for cough and wheezing.   Cardiovascular: Negative for chest pain and leg swelling.  Gastrointestinal: Negative.   Endocrine: Negative for polyphagia and polyuria.  Genitourinary: Negative for hematuria.  Musculoskeletal: Negative for myalgias.  Skin: Negative for pallor and rash.  Allergic/Immunologic: Negative for immunocompromised state.  Neurological: Negative for headaches.  Hematological: Does not bruise/bleed easily.  Psychiatric/Behavioral: Negative.  Objective:  BP 122/80 (BP Location: Right Arm, Patient Position: Sitting, Cuff Size: Normal)   Pulse 66   Ht 5\' 11"  (1.803 m)   Wt 152 lb 8 oz (69.2 kg)   SpO2 100%   BMI 21.27 kg/m   Physical  Exam  Constitutional: He is oriented to person, place, and time. He appears well-developed and well-nourished. No distress.  HENT:  Head: Normocephalic and atraumatic.  Right Ear: External ear normal.  Left Ear: External ear normal.  Mouth/Throat: Oropharynx is clear and moist. No oropharyngeal exudate.  Eyes: Pupils are equal, round, and reactive to light. Conjunctivae and EOM are normal. Right eye exhibits no discharge. Left eye exhibits no discharge. No scleral icterus.  Neck: Neck supple. No JVD present. No tracheal deviation present. No thyromegaly present.  Cardiovascular: Normal rate, regular rhythm and normal heart sounds.  Occasional extrasystoles are present.  Pulmonary/Chest: Effort normal and breath sounds normal. No respiratory distress. He has no wheezes.  Abdominal: Bowel sounds are normal.  Lymphadenopathy:    He has no cervical adenopathy.  Neurological: He is alert and oriented to person, place, and time.  Skin: He is not diaphoretic.  Psychiatric: He has a normal mood and affect. His behavior is normal.      Assessment & Plan:   [name] was seen today for establish care.  Diagnoses and all orders for this visit:  Paroxysmal atrial fibrillation ([name])  Essential hypertension -     Basic metabolic panel -     CBC -     Urinalysis, Routine w reflex microscopic  Type 2 diabetes mellitus without complication, without long-term current use of insulin ([name]) -     Basic metabolic panel -     CBC -     Urinalysis, Routine w reflex microscopic  Seasonal allergic rhinitis due to pollen -     loratadine (CLARITIN) 10 MG tablet; Take 1 tablet (10 mg total) by mouth daily as needed for allergies.   I am having [name] start on loratadine. I am also having him maintain his bisacodyl, metoprolol succinate, pantoprazole, cyproheptadine, furosemide, bisacodyl, feeding supplement, and Rivaroxaban.  Meds ordered this encounter  Medications  . loratadine  (CLARITIN) 10 MG tablet    Sig: Take 1 tablet (10 mg total) by mouth daily as needed for allergies.    Dispense:  30 tablet    Refill:  1     Follow-up: Return in about 3 months (around 12/03/2017).  [name], MD

## 2017-09-03 NOTE — Telephone Encounter (Signed)
Patient is requesting for something to be sent into the pharmacy for his allergies, please advise.

## 2017-09-15 ENCOUNTER — Other Ambulatory Visit: Payer: Self-pay

## 2017-09-15 NOTE — Telephone Encounter (Signed)
Don't see it either. Needs to rtc with son again with All meds to review.

## 2017-09-15 NOTE — Telephone Encounter (Signed)
Received a refill request for losartan-hctz 100-12.5 mg tablet. Medication is not on patient's med list.

## 2017-09-18 ENCOUNTER — Telehealth: Payer: Self-pay | Admitting: Cardiology

## 2017-09-18 ENCOUNTER — Encounter: Payer: Medicare Other | Admitting: *Deleted

## 2017-09-18 NOTE — Telephone Encounter (Signed)
LMOVM reminding pt to send remote transmission.   

## 2017-09-25 ENCOUNTER — Ambulatory Visit (INDEPENDENT_AMBULATORY_CARE_PROVIDER_SITE_OTHER): Payer: Medicare Other | Admitting: *Deleted

## 2017-09-25 DIAGNOSIS — I495 Sick sinus syndrome: Secondary | ICD-10-CM | POA: Diagnosis not present

## 2017-09-25 NOTE — Progress Notes (Signed)
Remote pacemaker transmission.   

## 2017-09-30 ENCOUNTER — Encounter: Payer: Self-pay | Admitting: Cardiology

## 2017-10-09 ENCOUNTER — Ambulatory Visit: Payer: Medicare Other | Admitting: Family Medicine

## 2017-10-16 ENCOUNTER — Ambulatory Visit: Payer: Medicare Other | Admitting: Family Medicine

## 2017-10-16 ENCOUNTER — Encounter: Payer: Self-pay | Admitting: Family Medicine

## 2017-10-16 VITALS — BP 124/78 | HR 77 | Ht 71.0 in | Wt 153.0 lb

## 2017-10-16 DIAGNOSIS — R630 Anorexia: Secondary | ICD-10-CM | POA: Insufficient documentation

## 2017-10-16 DIAGNOSIS — I10 Essential (primary) hypertension: Secondary | ICD-10-CM | POA: Diagnosis not present

## 2017-10-16 DIAGNOSIS — H9193 Unspecified hearing loss, bilateral: Secondary | ICD-10-CM

## 2017-10-16 DIAGNOSIS — D509 Iron deficiency anemia, unspecified: Secondary | ICD-10-CM

## 2017-10-16 DIAGNOSIS — N183 Chronic kidney disease, stage 3 unspecified: Secondary | ICD-10-CM

## 2017-10-16 LAB — CBC
HEMATOCRIT: 36.5 % — AB (ref 39.0–52.0)
HEMOGLOBIN: 11.8 g/dL — AB (ref 13.0–17.0)
MCHC: 32.3 g/dL (ref 30.0–36.0)
MCV: 80.9 fl (ref 78.0–100.0)
Platelets: 176 10*3/uL (ref 150.0–400.0)
RBC: 4.51 Mil/uL (ref 4.22–5.81)
RDW: 17.6 % — AB (ref 11.5–15.5)
WBC: 6.2 10*3/uL (ref 4.0–10.5)

## 2017-10-16 LAB — BASIC METABOLIC PANEL
BUN: 18 mg/dL (ref 6–23)
CALCIUM: 8.7 mg/dL (ref 8.4–10.5)
CO2: 23 meq/L (ref 19–32)
Chloride: 108 mEq/L (ref 96–112)
Creatinine, Ser: 1.44 mg/dL (ref 0.40–1.50)
GFR: 59.05 mL/min — ABNORMAL LOW (ref >60.00)
GLUCOSE: 113 mg/dL — AB (ref 70–99)
Potassium: 4.8 mEq/L (ref 3.5–5.1)
SODIUM: 139 meq/L (ref 135–145)

## 2017-10-16 MED ORDER — LOSARTAN POTASSIUM-HCTZ 100-12.5 MG PO TABS: 1.0000 | ORAL_TABLET | Freq: Every day | ORAL | 1 refills | Status: DC

## 2017-10-16 MED ORDER — MEGESTROL ACETATE 400 MG/10ML PO SUSP: 400.0000 mg | Freq: Every day | ORAL | 2 refills | Status: DC

## 2017-10-16 NOTE — Progress Notes (Addendum)
Subjective:  Patient ID: [name], male    DOB: [birth year]  Age: 84 y.o. MRN: [id number]  CC: Follow-up   HPI [name] presents for follow-up of his hypertension and stage III chronic kidney disease.  BP is well controlled.  He is taking the losartan.  We will recheck his GFR today to the note stability.  Rechecking his microcytic anemia today.  He has had no blood in his stool.  Recent urinalysis with clear without blood.  He has had some ongoing issues with decreased appetite.  He is supplementing with Ensure.  He has service related hearing loss and would like me to check his years for wax.  He is status post formal audiological evaluation.  He does have hearing aids but chooses not to wear them.  Outpatient Medications Prior to Visit  Medication Sig Dispense Refill  . bisacodyl (BISACODYL) 5 MG EC tablet Take 5 mg by mouth daily as needed for moderate constipation.    . bisacodyl (DULCOLAX) 10 MG suppository Place 10 mg rectally daily as needed for moderate constipation.    . cyproheptadine (PERIACTIN) 4 MG tablet Take 4 mg by mouth at bedtime.    . feeding supplement (ENSURE CLINICAL STRENGTH) LIQD Take 237 mLs by mouth 3 (three) times daily as needed. Food supplement    . loratadine (CLARITIN) 10 MG tablet Take 1 tablet (10 mg total) by mouth daily as needed for allergies. 30 tablet 1  . metoprolol succinate (TOPROL-XL) 50 MG 24 hr tablet Take 50 mg by mouth every evening.    . pantoprazole (PROTONIX) 40 MG tablet Take 1 tablet (40 mg total) by mouth daily. 30 tablet 11  . Rivaroxaban (XARELTO) 15 MG TABS tablet Take 1 tablet (15 mg total) by mouth daily with supper. 30 tablet 3  . furosemide (LASIX) 20 MG tablet Take 1 tablet (20 mg total) by mouth daily. 90 tablet 3  . losartan-hydrochlorothiazide (HYZAAR) 100-12.5 MG tablet Take 1 tablet by mouth daily.     No facility-administered medications prior to visit.     ROS Review of Systems  Constitutional: Positive  for appetite change. Negative for chills, fatigue and fever.  HENT: Positive for hearing loss. Negative for sore throat, trouble swallowing and voice change.   Eyes: Negative for photophobia and visual disturbance.  Respiratory: Negative.   Cardiovascular: Negative.   Gastrointestinal: Negative.   Endocrine: Negative for polyphagia and polyuria.  Genitourinary: Negative for dysuria, frequency and urgency.  Skin: Negative for color change, pallor and rash.  Allergic/Immunologic: Negative for immunocompromised state.  Neurological: Negative for weakness, numbness and headaches.  Hematological: Does not bruise/bleed easily.  Psychiatric/Behavioral: Negative.     Objective:  BP 124/78   Pulse 77   Ht 5\' 11"  (1.803 m)   Wt 153 lb (69.4 kg)   SpO2 98%   BMI 21.34 kg/m   BP Readings from Last 3 Encounters:  10/16/17 124/78  09/03/17 122/80  07/02/17 115/77    Wt Readings from Last 3 Encounters:  10/16/17 153 lb (69.4 kg)  09/03/17 152 lb 8 oz (69.2 kg)  07/02/17 155 lb 9.6 oz (70.6 kg)    Physical Exam  Constitutional: He appears well-developed and well-nourished. No distress.  HENT:  Head: Normocephalic and atraumatic.  Right Ear: External ear normal.  Left Ear: External ear normal.  Mouth/Throat: Oropharynx is clear and moist.  Eyes: Pupils are equal, round, and reactive to light. Conjunctivae and EOM are normal. Right eye exhibits no discharge.  Left eye exhibits no discharge. No scleral icterus.  Neck: No JVD present. No tracheal deviation present. No thyromegaly present.  Pulmonary/Chest: Effort normal.  Neurological: He is alert.  Skin: Skin is warm and dry. He is not diaphoretic.  Psychiatric: He has a normal mood and affect. His behavior is normal.    Lab Results  Component Value Date   WBC 6.2 10/16/2017   HGB 11.8 (L) 10/16/2017   HCT 36.5 (L) 10/16/2017   PLT 176.0 10/16/2017   GLUCOSE 113 (H) 10/16/2017   CHOL 122 07/23/2015   TRIG 55 07/23/2015   HDL  43 07/23/2015   LDLCALC 68 07/23/2015   ALT 9 (L) 01/11/2017   AST 25 01/11/2017   NA 139 10/16/2017   K 4.8 10/16/2017   CL 108 10/16/2017   CREATININE 1.44 10/16/2017   BUN 18 10/16/2017   CO2 23 10/16/2017   TSH 0.811 04/07/2010   PSA (H) 12/03/2007    6.13 (NOTE) Test Methodology: [employer] PSA Result repeated and verified.   INR 1.3 (H) 07/30/2016   HGBA1C 6.7 (H) 01/13/2017    No results found.  Assessment & Plan:   [name] was seen today for follow-up.  Diagnoses and all orders for this visit:  Essential hypertension -     losartan-hydrochlorothiazide (HYZAAR) 100-12.5 MG tablet; Take 1 tablet by mouth daily. -     Basic metabolic panel  Stage 3 chronic kidney disease ([name]) -     losartan-hydrochlorothiazide (HYZAAR) 100-12.5 MG tablet; Take 1 tablet by mouth daily. -     Basic metabolic panel  Microcytic anemia -     CBC -     Iron, TIBC and Ferritin Panel -     ferrous sulfate 325 (65 FE) MG tablet; Take 1 tablet (325 mg total) by mouth daily with breakfast.  Decreased appetite -     megestrol (MEGACE) 400 MG/10ML suspension; Take 10 mLs (400 mg total) by mouth daily.  Hearing difficulty of both ears   I have discontinued [name]. [name]'s furosemide. I am also having him start on megestrol and ferrous sulfate. Additionally, I am having him maintain his bisacodyl, metoprolol succinate, pantoprazole, cyproheptadine, bisacodyl, feeding supplement, Rivaroxaban, loratadine, and losartan-hydrochlorothiazide.  Meds ordered this encounter  Medications  . losartan-hydrochlorothiazide (HYZAAR) 100-12.5 MG tablet    Sig: Take 1 tablet by mouth daily.    Dispense:  90 tablet    Refill:  1  . megestrol (MEGACE) 400 MG/10ML suspension    Sig: Take 10 mLs (400 mg total) by mouth daily.    Dispense:  240 mL    Refill:  2  . ferrous sulfate 325 (65 FE) MG tablet    Sig: Take 1 tablet (325 mg total) by mouth daily with breakfast.    Dispense:  90 tablet    Refill:   1     Follow-up: Return in about 3 months (around 01/16/2018).  [name], MD

## 2017-10-17 LAB — IRON,TIBC AND FERRITIN PANEL
%SAT: 28 % (ref 15–60)
Ferritin: 23 ng/mL (ref 20–380)
IRON: 91 ug/dL (ref 50–180)
TIBC: 330 ug/dL (ref 250–425)

## 2017-10-17 MED ORDER — FERROUS SULFATE 325 (65 FE) MG PO TABS: 325.0000 mg | ORAL_TABLET | Freq: Every day | ORAL | 1 refills | Status: DC

## 2017-10-17 NOTE — Addendum Note (Signed)
Addended by: Jon Billings on: 10/17/2017 07:53 AM   Modules accepted: Orders

## 2017-10-20 ENCOUNTER — Telehealth: Payer: Self-pay

## 2017-10-20 NOTE — Telephone Encounter (Signed)
Received PA request for Megace. PA submitted & pending. Key: Jenetta Downer

## 2017-10-23 LAB — CUP PACEART REMOTE DEVICE CHECK
Implantable Lead Implant Date: 20060510
Implantable Pulse Generator Implant Date: 20131118
Lead Channel Impedance Value: 468 Ohm
Lead Channel Pacing Threshold Amplitude: 0.75 V
Lead Channel Pacing Threshold Pulse Width: 0.4 ms
Lead Channel Setting Sensing Sensitivity: 5.6 mV
MDC IDC LEAD LOCATION: 753860
MDC IDC MSMT BATTERY IMPEDANCE: 2281 Ohm
MDC IDC MSMT BATTERY REMAINING LONGEVITY: 31 mo
MDC IDC MSMT BATTERY VOLTAGE: 2.75 V
MDC IDC MSMT LEADCHNL RA IMPEDANCE VALUE: 0 Ohm
MDC IDC SESS DTM: 20190509120637
MDC IDC SET LEADCHNL RV PACING AMPLITUDE: 2.5 V
MDC IDC SET LEADCHNL RV PACING PULSEWIDTH: 0.4 ms
MDC IDC STAT BRADY RV PERCENT PACED: 72 %

## 2017-11-04 ENCOUNTER — Ambulatory Visit (HOSPITAL_COMMUNITY)
Admission: RE | Admit: 2017-11-04 | Discharge: 2017-11-04 | Disposition: A | Discharge status: Home / Self Care | Payer: Medicare Other | Source: Ambulatory Visit | Attending: Cardiovascular Disease | Admitting: Cardiovascular Disease

## 2017-11-04 DIAGNOSIS — Z8673 Personal history of transient ischemic attack (TIA), and cerebral infarction without residual deficits: Secondary | ICD-10-CM | POA: Insufficient documentation

## 2017-11-04 DIAGNOSIS — I10 Essential (primary) hypertension: Secondary | ICD-10-CM | POA: Diagnosis not present

## 2017-11-04 DIAGNOSIS — R9389 Abnormal findings on diagnostic imaging of other specified body structures: Secondary | ICD-10-CM | POA: Diagnosis not present

## 2017-11-04 DIAGNOSIS — I739 Peripheral vascular disease, unspecified: Secondary | ICD-10-CM | POA: Diagnosis not present

## 2017-11-04 DIAGNOSIS — Z87891 Personal history of nicotine dependence: Secondary | ICD-10-CM | POA: Insufficient documentation

## 2017-11-19 ENCOUNTER — Encounter: Payer: Self-pay | Admitting: *Deleted

## 2017-11-21 ENCOUNTER — Encounter: Payer: Self-pay | Admitting: *Deleted

## 2017-11-21 DIAGNOSIS — I739 Peripheral vascular disease, unspecified: Secondary | ICD-10-CM

## 2017-11-24 ENCOUNTER — Ambulatory Visit: Payer: Self-pay

## 2017-11-24 NOTE — Telephone Encounter (Signed)
Patient called in with c/o "dizziness." He says "this has been going on for about 2 weeks. When I get up, I feel dizzy and I have to grab onto something to steady myself. I am walking normal without holding on, the room doesn't spin." I asked about other symptoms, he says "I feel tired." According to protocol, see PCP within 24 hours, appointment scheduled for tomorrow at 1030 with Dr. Ethelene Hal, care advice given, patient verbalized understanding.   Reason for Disposition . [1] MODERATE dizziness (e.g., interferes with normal activities) AND [2] has NOT been evaluated by physician for this  (Exception: dizziness caused by heat exposure, sudden standing, or poor fluid intake)  Answer Assessment - Initial Assessment Questions 1. DESCRIPTION: "Describe your dizziness."     Lightheaded 2. LIGHTHEADED: "Do you feel lightheaded?" (e.g., somewhat faint, woozy, weak upon standing)     Yes 3. VERTIGO: "Do you feel like either you or the room is spinning or tilting?" (i.e. vertigo)     No 4. SEVERITY: "How bad is it?"  "Do you feel like you are going to faint?" "Can you stand and walk?"   - MILD - walking normally   - MODERATE - interferes with normal activities (e.g., work, school)    - SEVERE - unable to stand, requires support to walk, feels like passing out now.      Mild 5. ONSET:  "When did the dizziness begin?"     2 weeks ago 6. AGGRAVATING FACTORS: "Does anything make it worse?" (e.g., standing, change in head position)     Standing up 7. HEART RATE: "Can you tell me your heart rate?" "How many beats in 15 seconds?"  (Note: not all patients can do this)       No 8. CAUSE: "What do you think is causing the dizziness?"     I don't know 9. RECURRENT SYMPTOM: "Have you had dizziness before?" If so, ask: "When was the last time?" "What happened that time?"     No 10. OTHER SYMPTOMS: "Do you have any other symptoms?" (e.g., fever, chest pain, vomiting, diarrhea, bleeding)      No 11. PREGNANCY:  "Is there any chance you are pregnant?" "When was your last menstrual period?"       N/A  Protocols used: DIZZINESS [facility]

## 2017-11-25 ENCOUNTER — Ambulatory Visit: Payer: Medicare Other | Admitting: Family Medicine

## 2017-11-25 ENCOUNTER — Encounter: Payer: Self-pay | Admitting: Family Medicine

## 2017-11-25 ENCOUNTER — Telehealth: Payer: Self-pay | Admitting: Family Medicine

## 2017-11-25 VITALS — BP 110/80 | Ht 71.0 in

## 2017-11-25 DIAGNOSIS — R0609 Other forms of dyspnea: Secondary | ICD-10-CM

## 2017-11-25 DIAGNOSIS — R42 Dizziness and giddiness: Secondary | ICD-10-CM | POA: Diagnosis not present

## 2017-11-25 DIAGNOSIS — D509 Iron deficiency anemia, unspecified: Secondary | ICD-10-CM

## 2017-11-25 NOTE — Telephone Encounter (Signed)
Dr. Ronnald Ramp,  Would you be able to see this patient?

## 2017-11-25 NOTE — Telephone Encounter (Signed)
This patient and his grand-daughter Agricultural consultant) came into the office requesting to transfer care from Dr Ethelene Hal at [place] to a provider here at [facility] due to the location. The [place] office would be much easier and convienent for him to get to. [name] said that they have already gotten to okay to transfer from Dr Ethelene Hal.  [name] mentioned that the patient has a pacemaker and had recently been having trouble with balance while walking and some dizziness which has been concerning to her.  Dr. Jenny Reichmann,  Would you be willing to see this patient to establish care as a transfer?

## 2017-11-25 NOTE — Progress Notes (Signed)
Subjective:  Patient ID: [name], male    DOB: Apr 25, 1924  Age: 84 y.o. MRN: [id number]  CC: Dizziness   HPI [name] presents for evaluation of new onset dyspnea on exertion.  He become short of breath with walking across the room.  He denies chest pain, nausea or vomiting or diaphoresis.  Past medical history of rate controlled atrial atrial fibrillation.  He has a history of bradycardia tachycardia syndrome with an implanted pacemaker.  He also has past medical history of hypertension and ventricular tachycardia.  He is here with his granddaughter who lays out his pills in a pillbox.  She has noted that he has not been taking his iron pill for some time now.  His house is not well  air-conditioning.  He does not always drink enough fluids other than coffee and sodas.  There is no swelling in his lower extremities and he sleeps on one pillow.  Outpatient Medications Prior to Visit  Medication Sig Dispense Refill  . bisacodyl (BISACODYL) 5 MG EC tablet Take 5 mg by mouth daily as needed for moderate constipation.    . bisacodyl (DULCOLAX) 10 MG suppository Place 10 mg rectally daily as needed for moderate constipation.    . cyproheptadine (PERIACTIN) 4 MG tablet Take 4 mg by mouth at bedtime.    . feeding supplement (ENSURE CLINICAL STRENGTH) LIQD Take 237 mLs by mouth 3 (three) times daily as needed. Food supplement    . ferrous sulfate 325 (65 FE) MG tablet Take 1 tablet (325 mg total) by mouth daily with breakfast. 90 tablet 1  . loratadine (CLARITIN) 10 MG tablet Take 1 tablet (10 mg total) by mouth daily as needed for allergies. 30 tablet 1  . losartan-hydrochlorothiazide (HYZAAR) 100-12.5 MG tablet Take 1 tablet by mouth daily. 90 tablet 1  . megestrol (MEGACE) 400 MG/10ML suspension Take 10 mLs (400 mg total) by mouth daily. 240 mL 2  . metoprolol succinate (TOPROL-XL) 50 MG 24 hr tablet Take 50 mg by mouth every evening.    . pantoprazole (PROTONIX) 40 MG tablet Take  1 tablet (40 mg total) by mouth daily. 30 tablet 11  . Rivaroxaban (XARELTO) 15 MG TABS tablet Take 1 tablet (15 mg total) by mouth daily with supper. 30 tablet 3   No facility-administered medications prior to visit.     ROS Review of Systems  Constitutional: Negative.  Negative for activity change, appetite change, fever and unexpected weight change.  HENT: Positive for hearing loss.   Eyes: Negative.   Respiratory: Positive for shortness of breath. Negative for chest tightness and wheezing.   Cardiovascular: Negative for chest pain, palpitations and leg swelling.  Gastrointestinal: Negative.   Endocrine: Negative for polyphagia and polyuria.  Genitourinary: Negative for difficulty urinating.  Hematological: Bruises/bleeds easily.  Psychiatric/Behavioral: Negative.     Objective:  BP 110/80   Ht 5\' 11"  (1.803 m)   BMI 21.34 kg/m   BP Readings from Last 3 Encounters:  11/25/17 110/80  10/16/17 124/78  09/03/17 122/80    Wt Readings from Last 3 Encounters:  10/16/17 153 lb (69.4 kg)  09/03/17 152 lb 8 oz (69.2 kg)  07/02/17 155 lb 9.6 oz (70.6 kg)    Physical Exam  Constitutional: He is oriented to person, place, and time. He appears well-developed and well-nourished. No distress.  HENT:  Head: Normocephalic and atraumatic.  Right Ear: External ear normal.  Left Ear: External ear normal.  Mouth/Throat: Oropharynx is clear and moist.  No oropharyngeal exudate.  Eyes: Pupils are equal, round, and reactive to light. Conjunctivae and EOM are normal. Right eye exhibits no discharge. Left eye exhibits no discharge. No scleral icterus.  Neck: No JVD present. No tracheal deviation present.  Cardiovascular: An irregularly irregular rhythm present.  Extrasystoles are present.  Pulmonary/Chest: Effort normal and breath sounds normal.  Abdominal: Bowel sounds are normal.  Neurological: He is alert and oriented to person, place, and time.  Skin: Skin is warm and dry. He is not  diaphoretic.  Psychiatric: He has a normal mood and affect. His behavior is normal.    Lab Results  Component Value Date   WBC 6.2 10/16/2017   HGB 11.8 (L) 10/16/2017   HCT 36.5 (L) 10/16/2017   PLT 176.0 10/16/2017   GLUCOSE 113 (H) 10/16/2017   CHOL 122 07/23/2015   TRIG 55 07/23/2015   HDL 43 07/23/2015   LDLCALC 68 07/23/2015   ALT 9 (L) 01/11/2017   AST 25 01/11/2017   NA 139 10/16/2017   K 4.8 10/16/2017   CL 108 10/16/2017   CREATININE 1.44 10/16/2017   BUN 18 10/16/2017   CO2 23 10/16/2017   TSH 0.811 04/07/2010   PSA (H) 12/03/2007    6.13 (NOTE) Test Methodology: [employer] PSA Result repeated and verified.   INR 1.3 (H) 07/30/2016   HGBA1C 6.7 (H) 01/13/2017    No results found.  Assessment & Plan:   [name] was seen today for dizziness.  Diagnoses and all orders for this visit:  DOE (dyspnea on exertion)  Light headedness  Iron deficiency anemia, unspecified iron deficiency anemia type   I am having [name] maintain his bisacodyl, metoprolol succinate, pantoprazole, cyproheptadine, bisacodyl, feeding supplement, Rivaroxaban, loratadine, losartan-hydrochlorothiazide, megestrol, and ferrous sulfate.  No orders of the defined types were placed in this encounter.  Advised granddaughter to take patient to ER for rapid assessment of new onset DOE.  Am concerned about dehydration, possible acute anemia or heart arrhythmia.  Follow-up: No follow-ups on file.  [name], MD

## 2017-11-25 NOTE — Telephone Encounter (Signed)
Sorry, I would not be able to accomodate at this time, thanks

## 2017-11-26 NOTE — Telephone Encounter (Signed)
No, I can't add him 

## 2017-11-28 NOTE — Telephone Encounter (Signed)
Left message with patient's granddaughter informing her that there are no MDs here in the office currently able to accept new patients at this time. I advised her that the patient would be able to establish care with Jodi Mourning, NP. While they were in the officer the other day, she expressed to me that it would be best for him to see an MD due to his pacemaker. The patient does have a cardiologist, so they should be keeping up with all of his pacemaker needs. Mickel Baas is a great provider and I think she would be a wonderful fit for him. Otherwise, there are no other providers in the office able to accept him at this time.

## 2017-12-01 ENCOUNTER — Encounter (HOSPITAL_COMMUNITY): Payer: Self-pay | Admitting: Emergency Medicine

## 2017-12-01 ENCOUNTER — Other Ambulatory Visit: Payer: Self-pay

## 2017-12-01 ENCOUNTER — Inpatient Hospital Stay (HOSPITAL_COMMUNITY)
Admission: EM | Admit: 2017-12-01 | Discharge: 2017-12-05 | DRG: 683 | Disposition: A | Discharge status: Home Health | Payer: Medicare Other | Attending: Family Medicine | Admitting: Family Medicine

## 2017-12-01 ENCOUNTER — Emergency Department (HOSPITAL_COMMUNITY): Payer: Medicare Other

## 2017-12-01 DIAGNOSIS — I4891 Unspecified atrial fibrillation: Secondary | ICD-10-CM | POA: Diagnosis present

## 2017-12-01 DIAGNOSIS — Z6821 Body mass index (BMI) 21.0-21.9, adult: Secondary | ICD-10-CM

## 2017-12-01 DIAGNOSIS — E872 Acidosis: Secondary | ICD-10-CM | POA: Diagnosis present

## 2017-12-01 DIAGNOSIS — I482 Chronic atrial fibrillation: Secondary | ICD-10-CM

## 2017-12-01 DIAGNOSIS — I129 Hypertensive chronic kidney disease with stage 1 through stage 4 chronic kidney disease, or unspecified chronic kidney disease: Secondary | ICD-10-CM | POA: Diagnosis present

## 2017-12-01 DIAGNOSIS — E860 Dehydration: Secondary | ICD-10-CM | POA: Diagnosis not present

## 2017-12-01 DIAGNOSIS — Z9049 Acquired absence of other specified parts of digestive tract: Secondary | ICD-10-CM

## 2017-12-01 DIAGNOSIS — M1990 Unspecified osteoarthritis, unspecified site: Secondary | ICD-10-CM | POA: Diagnosis present

## 2017-12-01 DIAGNOSIS — R55 Syncope and collapse: Secondary | ICD-10-CM | POA: Diagnosis present

## 2017-12-01 DIAGNOSIS — E440 Moderate protein-calorie malnutrition: Secondary | ICD-10-CM | POA: Diagnosis present

## 2017-12-01 DIAGNOSIS — Z79899 Other long term (current) drug therapy: Secondary | ICD-10-CM

## 2017-12-01 DIAGNOSIS — I739 Peripheral vascular disease, unspecified: Secondary | ICD-10-CM | POA: Diagnosis present

## 2017-12-01 DIAGNOSIS — Z950 Presence of cardiac pacemaker: Secondary | ICD-10-CM

## 2017-12-01 DIAGNOSIS — D509 Iron deficiency anemia, unspecified: Secondary | ICD-10-CM | POA: Diagnosis present

## 2017-12-01 DIAGNOSIS — E875 Hyperkalemia: Secondary | ICD-10-CM | POA: Diagnosis not present

## 2017-12-01 DIAGNOSIS — Z823 Family history of stroke: Secondary | ICD-10-CM

## 2017-12-01 DIAGNOSIS — I10 Essential (primary) hypertension: Secondary | ICD-10-CM | POA: Diagnosis not present

## 2017-12-01 DIAGNOSIS — N179 Acute kidney failure, unspecified: Principal | ICD-10-CM | POA: Diagnosis present

## 2017-12-01 DIAGNOSIS — K56609 Unspecified intestinal obstruction, unspecified as to partial versus complete obstruction: Secondary | ICD-10-CM | POA: Diagnosis present

## 2017-12-01 DIAGNOSIS — R5381 Other malaise: Secondary | ICD-10-CM | POA: Diagnosis not present

## 2017-12-01 DIAGNOSIS — E1122 Type 2 diabetes mellitus with diabetic chronic kidney disease: Secondary | ICD-10-CM | POA: Diagnosis present

## 2017-12-01 DIAGNOSIS — H9193 Unspecified hearing loss, bilateral: Secondary | ICD-10-CM | POA: Diagnosis present

## 2017-12-01 DIAGNOSIS — Z8673 Personal history of transient ischemic attack (TIA), and cerebral infarction without residual deficits: Secondary | ICD-10-CM

## 2017-12-01 DIAGNOSIS — Z87891 Personal history of nicotine dependence: Secondary | ICD-10-CM

## 2017-12-01 DIAGNOSIS — N183 Chronic kidney disease, stage 3 (moderate): Secondary | ICD-10-CM | POA: Diagnosis present

## 2017-12-01 DIAGNOSIS — Z7901 Long term (current) use of anticoagulants: Secondary | ICD-10-CM

## 2017-12-01 DIAGNOSIS — I495 Sick sinus syndrome: Secondary | ICD-10-CM | POA: Diagnosis present

## 2017-12-01 DIAGNOSIS — E878 Other disorders of electrolyte and fluid balance, not elsewhere classified: Secondary | ICD-10-CM | POA: Diagnosis present

## 2017-12-01 DIAGNOSIS — Z8546 Personal history of malignant neoplasm of prostate: Secondary | ICD-10-CM

## 2017-12-01 LAB — COMPREHENSIVE METABOLIC PANEL
ALT: 11 U/L (ref 0–44)
AST: 19 U/L (ref 15–41)
Albumin: 3.7 g/dL (ref 3.5–5.0)
Alkaline Phosphatase: 62 U/L (ref 38–126)
Anion gap: 7 (ref 5–15)
BUN: 35 mg/dL — AB (ref 8–23)
CHLORIDE: 117 mmol/L — AB (ref 98–111)
CO2: 18 mmol/L — ABNORMAL LOW (ref 22–32)
Calcium: 9 mg/dL (ref 8.9–10.3)
Creatinine, Ser: 2.03 mg/dL — ABNORMAL HIGH (ref 0.61–1.24)
GFR calc Af Amer: 31 mL/min — ABNORMAL LOW (ref >60)
GFR, EST NON AFRICAN AMERICAN: 27 mL/min — AB (ref >60)
Glucose, Bld: 79 mg/dL (ref 70–99)
Potassium: 5.2 mmol/L — ABNORMAL HIGH (ref 3.5–5.1)
Sodium: 142 mmol/L (ref 135–145)
Total Bilirubin: 0.6 mg/dL (ref 0.3–1.2)
Total Protein: 6.8 g/dL (ref 6.5–8.1)

## 2017-12-01 LAB — CBC
HCT: 32.1 % — ABNORMAL LOW (ref 39.0–52.0)
Hemoglobin: 10.7 g/dL — ABNORMAL LOW (ref 13.0–17.0)
MCH: 26.1 pg (ref 26.0–34.0)
MCHC: 33.3 g/dL (ref 30.0–36.0)
MCV: 78.3 fL (ref 78.0–100.0)
PLATELETS: 192 10*3/uL (ref 150–400)
RBC: 4.1 MIL/uL — ABNORMAL LOW (ref 4.22–5.81)
RDW: 17.3 % — AB (ref 11.5–15.5)
WBC: 7.8 10*3/uL (ref 4.0–10.5)

## 2017-12-01 LAB — LIPASE, BLOOD: LIPASE: 55 U/L — AB (ref 11–51)

## 2017-12-01 LAB — URINALYSIS, ROUTINE W REFLEX MICROSCOPIC
BILIRUBIN URINE: NEGATIVE
GLUCOSE, UA: NEGATIVE mg/dL
HGB URINE DIPSTICK: NEGATIVE
KETONES UR: NEGATIVE mg/dL
Leukocytes, UA: NEGATIVE
Nitrite: NEGATIVE
PROTEIN: NEGATIVE mg/dL
Specific Gravity, Urine: 1.011 (ref 1.005–1.030)
pH: 5 (ref 5.0–8.0)

## 2017-12-01 LAB — I-STAT CG4 LACTIC ACID, ED: Lactic Acid, Venous: 1.35 mmol/L (ref 0.5–1.9)

## 2017-12-01 MED ORDER — MIRTAZAPINE 7.5 MG PO TABS
7.5000 mg | ORAL_TABLET | Freq: Every day | ORAL | Status: DC
  Administered 2017-12-01 – 2017-12-04 (×4): 7.5 mg via ORAL
  Filled 2017-12-01 (×5): qty 1

## 2017-12-01 MED ORDER — FUROSEMIDE 10 MG/ML IJ SOLN
20.0000 mg | Freq: Once | INTRAMUSCULAR | Status: AC
  Administered 2017-12-01: 20 mg via INTRAVENOUS
  Filled 2017-12-01: qty 2

## 2017-12-01 MED ORDER — METOPROLOL SUCCINATE ER 50 MG PO TB24
50.0000 mg | ORAL_TABLET | Freq: Every evening | ORAL | Status: DC
  Administered 2017-12-01 – 2017-12-02 (×2): 50 mg via ORAL
  Filled 2017-12-01 (×3): qty 1

## 2017-12-01 MED ORDER — PANTOPRAZOLE SODIUM 40 MG PO TBEC
40.0000 mg | DELAYED_RELEASE_TABLET | Freq: Every day | ORAL | Status: DC
  Administered 2017-12-01 – 2017-12-05 (×5): 40 mg via ORAL
  Filled 2017-12-01 (×5): qty 1

## 2017-12-01 MED ORDER — SODIUM CHLORIDE 0.9 % IV SOLN
INTRAVENOUS | Status: DC
  Administered 2017-12-01: 19:00:00 via INTRAVENOUS
  Administered 2017-12-02: 1000 mL via INTRAVENOUS
  Administered 2017-12-04: 22:00:00 via INTRAVENOUS

## 2017-12-01 MED ORDER — RIVAROXABAN 15 MG PO TABS
15.0000 mg | ORAL_TABLET | Freq: Every day | ORAL | Status: DC
  Administered 2017-12-01 – 2017-12-04 (×4): 15 mg via ORAL
  Filled 2017-12-01 (×6): qty 1

## 2017-12-01 MED ORDER — ONDANSETRON HCL 4 MG/2ML IJ SOLN
4.0000 mg | Freq: Four times a day (QID) | INTRAMUSCULAR | Status: DC | PRN
  Administered 2017-12-04: 4 mg via INTRAVENOUS
  Filled 2017-12-01: qty 2

## 2017-12-01 MED ORDER — SODIUM CHLORIDE 0.9 % IV BOLUS
1000.0000 mL | Freq: Once | INTRAVENOUS | Status: AC
  Administered 2017-12-01: 1000 mL via INTRAVENOUS

## 2017-12-01 MED ORDER — BOOST / RESOURCE BREEZE PO LIQD CUSTOM
1.0000 | Freq: Three times a day (TID) | ORAL | Status: DC
  Administered 2017-12-01 – 2017-12-05 (×11): 1 via ORAL

## 2017-12-01 NOTE — ED Notes (Signed)
ED TO INPATIENT HANDOFF REPORT  Name/Age/Gender [name] 84 y.o. male  Code Status    Code Status Orders  (From admission, onward)        Start     Ordered   12/01/17 1750  Full code  Continuous     12/01/17 1750    Code Status History    Date Active Date Inactive Code Status Order ID Comments User Context   01/12/2017 1000 01/17/2017 1356 Full Code [id number]  Georgette Shell, MD Inpatient   08/14/2016 1733 08/15/2016 1641 Full Code [id number]  Wellington Hampshire, MD Inpatient   08/07/2016 1333 08/07/2016 2233 Full Code [id number]  Wellington Hampshire, MD Inpatient   07/23/2015 1610 07/25/2015 1803 Full Code [id number]  Radene Gunning, NP ED   08/17/2013 2011 08/19/2013 1744 Full Code [id number]  Erline Hau, MD Inpatient   02/15/2012 0025 02/18/2012 2115 Full Code [id number]  Theressa Millard, MD Inpatient    Advance Directive Documentation     Most Recent Value  Type of Advance Directive  Healthcare Power of Attorney, Living will  Pre-existing out of facility DNR order (yellow form or pink MOST form)  -  "MOST" Form in Place?  -      Home/SNF/Other Home  Chief Complaint sob  Level of Care/Admitting Diagnosis ED Disposition    ED Disposition Condition [place]: [facility] [100102]  Level of Care: Med-Surg [16]  Diagnosis: AKI (acute kidney injury) [facility]) [[id number]]  Admitting Physician: Patrecia Pour, EDWIN [[id number]]  Attending Physician: Patrecia Pour, EDWIN [[id number]]  PT Class (Do Not Modify): Observation [104]  PT Acc Code (Do Not Modify): Observation [10022]       Medical History Past Medical History:  Diagnosis Date  . Arthritis   . Atrial fibrillation ([place])   . CKD (chronic kidney disease)   . History of hiatal hernia   . Hx SBO    Last 2013 all treated conservatively  . Hypertension   . Presence of permanent cardiac pacemaker   . Prostate cancer ([place])   . Stroke [facility]) 2017   left facial drooping  noted 08/14/2016  . Tachycardia-bradycardia syndrome ([place])   . Type II diabetes mellitus ([place])   . Ventral hernia    x3    Allergies No Known Allergies  IV Location/Drains/Wounds Patient Lines/Drains/Airways Status   Active Line/Drains/Airways    Name:   Placement date:   Placement time:   Site:   Days:   Peripheral IV 12/01/17 Right Antecubital   12/01/17    1554    Antecubital   less than 1          Labs/Imaging Results for orders placed or performed during the hospital encounter of 12/01/17 (from the past 48 hour(s))  Lipase, blood     Status: Abnormal   Collection Time: 12/01/17  1:06 PM  Result Value Ref Range   Lipase 55 (H) 11 - 51 U/L    Comment: Performed at [facility], [place] [address], [place], [place] [address]  Comprehensive metabolic panel     Status: Abnormal   Collection Time: 12/01/17  1:06 PM  Result Value Ref Range   Sodium 142 135 - 145 mmol/L   Potassium 5.2 (H) 3.5 - 5.1 mmol/L   Chloride 117 (H) 98 - 111 mmol/L    Comment: Please note change in reference range.   CO2 18 (L) 22 - 32 mmol/L   Glucose,  Bld 79 70 - 99 mg/dL    Comment: Please note change in reference range.   BUN 35 (H) 8 - 23 mg/dL    Comment: Please note change in reference range.   Creatinine, Ser 2.03 (H) 0.61 - 1.24 mg/dL   Calcium 9.0 8.9 - 10.3 mg/dL   Total Protein 6.8 6.5 - 8.1 g/dL   Albumin 3.7 3.5 - 5.0 g/dL   AST 19 15 - 41 U/L   ALT 11 0 - 44 U/L    Comment: Please note change in reference range.   Alkaline Phosphatase 62 38 - 126 U/L   Total Bilirubin 0.6 0.3 - 1.2 mg/dL   GFR calc non Af Amer 27 (L) >60 mL/min   GFR calc Af Amer 31 (L) >60 mL/min    Comment: (NOTE) The eGFR has been calculated using the CKD EPI equation. This calculation has not been validated in all clinical situations. eGFR's persistently <60 mL/min signify possible Chronic Kidney Disease.    Anion gap 7 5 - 15    Comment: Performed at [facility], [address] [place], [place] [address]  CBC     Status: Abnormal   Collection Time: 12/01/17  1:06 PM  Result Value Ref Range   WBC 7.8 4.0 - 10.5 K/uL   RBC 4.10 (L) 4.22 - 5.81 MIL/uL   Hemoglobin 10.7 (L) 13.0 - 17.0 g/dL   HCT 32.1 (L) 39.0 - 52.0 %   MCV 78.3 78.0 - 100.0 fL   MCH 26.1 26.0 - 34.0 pg   MCHC 33.3 30.0 - 36.0 g/dL   RDW 17.3 (H) 11.5 - 15.5 %   Platelets 192 150 - 400 K/uL    Comment: Performed at [facility], [address] [place], [place] [address]  Urinalysis, Routine w reflex microscopic     Status: None   Collection Time: 12/01/17  3:39 PM  Result Value Ref Range   Color, Urine YELLOW YELLOW   APPearance CLEAR CLEAR   Specific Gravity, Urine 1.011 1.005 - 1.030   pH 5.0 5.0 - 8.0   Glucose, UA NEGATIVE NEGATIVE mg/dL   Hgb urine dipstick NEGATIVE NEGATIVE   Bilirubin Urine NEGATIVE NEGATIVE   Ketones, ur NEGATIVE NEGATIVE mg/dL   Protein, ur NEGATIVE NEGATIVE mg/dL   Nitrite NEGATIVE NEGATIVE   Leukocytes, UA NEGATIVE NEGATIVE    Comment: Performed at [facility], [address] [place],  [address]  I-Stat CG4 Lactic Acid, ED     Status: None   Collection Time: 12/01/17  3:52 PM  Result Value Ref Range   Lactic Acid, Venous 1.35 0.5 - 1.9 mmol/L   Ct Abdomen Pelvis Wo Contrast  Result Date: 12/01/2017 CLINICAL DATA:  Dehydration, lack of appetite and indigestion. EXAM: CT ABDOMEN AND PELVIS WITHOUT CONTRAST TECHNIQUE: Multidetector CT imaging of the abdomen and pelvis was performed following the standard protocol without IV contrast. COMPARISON:  CT of the abdomen and pelvis with contrast on 01/12/2017 FINDINGS: Lower chest: No acute abnormality. Hepatobiliary: No focal liver abnormality is seen. Status post cholecystectomy. No biliary dilatation. Pancreas: Atrophic pancreas. No evidence of pancreatic inflammation, mass or ductal dilatation. Spleen: Normal in size without focal abnormality. Adrenals/Urinary  Tract: No adrenal masses. The kidneys appear atrophic, especially the right. No hydronephrosis. The bladder appears unremarkable. Stomach/Bowel: No evidence of bowel obstruction or significant ileus. Stable moderate-sized hiatal hernia. Stable right-sided ventral hernia defect containing a segment of the transverse colon without signs   of incarceration. Stable second smaller right ventral hernia defect just to the right of the umbilicus containing a segment of small bowel without signs of incarceration. Anastomotic suture line again evident in the right lower quadrant related to prior small bowel resection. No free air. Vascular/Lymphatic: No significant vascular findings are present. No enlarged abdominal or pelvic lymph nodes. Reproductive: Prostate is unremarkable. Other: No ascites or focal abscess identified. Musculoskeletal: No acute or significant osseous findings. IMPRESSION: No significant acute findings. Stable ventral hernias of the right abdominal wall containing a segment of the transverse colon and small bowel. Bowel at the level of both hernias demonstrate no evidence of incarceration. Electronically Signed   By: Glenn  Yamagata M.D.   On: 12/01/2017 16:21    Pending Labs Unresulted Labs (From admission, onward)   Start     Ordered   12/02/17 0500  Basic metabolic panel  Tomorrow morning,   R     12/01/17 1750   12/02/17 0500  CBC  Tomorrow morning,   R     12/01/17 1750   12/02/17 0500  Magnesium  Tomorrow morning,   R     12/01/17 1750      Vitals/Pain Today's Vitals   12/01/17 1248 12/01/17 1503 12/01/17 1633 12/01/17 1759  BP:  114/68 (!) 145/98 131/88  Pulse:  72 93 96  Resp:  15 19 16  Temp:      TempSrc:      SpO2:  100% 98% 96%  PainSc: 0-No pain       Isolation Precautions No active isolations  Medications Medications  metoprolol succinate (TOPROL-XL) 24 hr tablet 50 mg (has no administration in time range)  pantoprazole (PROTONIX) EC tablet 40 mg (has no  administration in time range)  Rivaroxaban (XARELTO) tablet 15 mg (has no administration in time range)  ondansetron (ZOFRAN) injection 4 mg (has no administration in time range)  0.9 %  sodium chloride infusion (has no administration in time range)  sodium chloride 0.9 % bolus 1,000 mL (1,000 mLs Intravenous New Bag/Given 12/01/17 1554)    Mobility walks with person assist  

## 2017-12-01 NOTE — ED Provider Notes (Signed)
[facility] DEPT Provider Note   CSN: [id number] Arrival date & time: 12/01/17  1240     History   Chief Complaint Chief Complaint  Patient presents with  . Diarrhea  . Dehydration    HPI [name] is a 84 y.o. male.  HPI   84 yo M with PMHx AFib, T2DM, AFib, here with weakness. Pt reports 1 week of progressively worsening generalized weakness.  He states that he has been having diarrhea and has not had much of an appetite.  Over the last week, he is noticed extreme dizziness upon standing and generalized weakness.  Lives alone and is normally able to get up and do what he needs to do, but is unable to do this recently.  He states he has intermittent abdominal cramping but no persistent pain.  No chest pain.  He does note some dyspnea with exertion and he feels incredibly fatigued just moving across the house.  He was seen by his PCP recommended admission last week, but states that he did not feel like it.  However, over the last week, his weakness and symptoms of worsened.  He is essentially unable to get around his house at this time.  This is all new for him.  No medication changes.  Past Medical History:  Diagnosis Date  . Arthritis   . Atrial fibrillation ([place])   . CKD (chronic kidney disease)   . History of hiatal hernia   . Hx SBO    Last 2013 all treated conservatively  . Hypertension   . Presence of permanent cardiac pacemaker   . Prostate cancer ([place])   . Stroke [facility]) 2017   left facial drooping noted 08/14/2016  . Tachycardia-bradycardia syndrome ([place])   . Type II diabetes mellitus ([place])   . Ventral hernia    x3    Patient Active Problem List   Diagnosis Date Noted  . Light headedness 11/25/2017  . Iron deficiency anemia 11/25/2017  . Decreased appetite 10/16/2017  . Hearing difficulty of both ears 10/16/2017  . Seasonal allergic rhinitis due to pollen 09/03/2017  . Ventricular tachycardia ([place]) 01/14/2017  .  Malnutrition of moderate degree 01/13/2017  . DOE (dyspnea on exertion) 01/12/2017  . Small bowel obstruction ([place]) 01/12/2017  . Hypertension   . PAD (peripheral artery disease) ([place]) 08/14/2016  . CVA (cerebral infarction) 07/23/2015  . Microcytic anemia 07/23/2015  . Stroke (cerebrum) ([place]) 07/23/2015  . Chronic kidney disease 07/23/2015  . Acute CVA (cerebrovascular accident) ([place])   . Ventral hernia with obstruction 08/17/2013  . Hypokalemia 08/17/2013  . SBO (small bowel obstruction) ([place]) 02/14/2012  . Abdominal pain 02/14/2012  . Nausea & vomiting 02/14/2012  . PPM-[employer] 03/13/2010  . Diabetes ([place]) 11/14/2008  . Atrial fibrillation ([place]) 11/14/2008  . BRADYCARDIA-TACHYCARDIA SYNDROME 11/14/2008    Past Surgical History:  Procedure Laterality Date  . ABDOMINAL AORTOGRAM W/LOWER EXTREMITY N/A 08/07/2016   Procedure: Abdominal Aortogram w/Lower Extremity;  Surgeon: Wellington Hampshire, MD;  Location: [place] CV LAB;  Service: Cardiovascular;  Laterality: N/A;  . CHOLECYSTECTOMY OPEN  03/31/2001   Dr Lindon Romp  . COLON SURGERY    . EXPLORATORY LAPAROTOMY  08/2004   Archie Endo 10/01/2010  . HEMORRHOID SURGERY    . HERNIA REPAIR    . HIATAL HERNIA REPAIR  2002  . INCISIONAL HERNIA REPAIR  08/2004   Archie Endo 10/01/2010  . INSERT / REPLACE / REMOVE PACEMAKER    . PERIPHERAL VASCULAR BALLOON ANGIOPLASTY  08/07/2016  Procedure: Peripheral Vascular Balloon Angioplasty;  Surgeon: Wellington Hampshire, MD;  Location: [place] CV LAB;  Service: Cardiovascular;;  left popliteal artery  . PERIPHERAL VASCULAR INTERVENTION  08/14/2016   popliteal artery/notes 08/14/2016  . PERIPHERAL VASCULAR INTERVENTION Left 08/14/2016   Procedure: Peripheral Vascular Intervention;  Surgeon: Wellington Hampshire, MD;  Location: [place] CV LAB;  Service: Cardiovascular;  Laterality: Left;  popliteal artery  . PERMANENT PACEMAKER GENERATOR CHANGE N/A 04/06/2012   Procedure: PERMANENT PACEMAKER GENERATOR  CHANGE;  Surgeon: Evans Lance, MD;  Location: [facility] C[facility] LAB;  Service: Cardiovascular;  Laterality: N/A;  . SMALL INTESTINE SURGERY  09/16/2004   SBO resection, VH repair        Home Medications    Prior to Admission medications   Medication Sig Start Date End Date Taking? Authorizing Provider  ferrous sulfate 325 (65 FE) MG tablet Take 1 tablet (325 mg total) by mouth daily with breakfast. 10/17/17  Yes [name], MD  losartan-hydrochlorothiazide [facility]) 100-12.5 MG tablet Take 1 tablet by mouth daily. 10/16/17  Yes [name], MD  megestrol (MEGACE) 400 MG/10ML suspension Take 10 mLs (400 mg total) by mouth daily. 10/16/17  Yes [name], MD  metoprolol succinate (TOPROL-XL) 50 MG 24 hr tablet Take 50 mg by mouth every evening.   Yes [provider]  pantoprazole (PROTONIX) 40 MG tablet Take 1 tablet (40 mg total) by mouth daily. 08/15/16  Yes Barrett, Evelene Croon, PA-C  Rivaroxaban (XARELTO) 15 MG TABS tablet Take 1 tablet (15 mg total) by mouth daily with supper. 08/11/17  Yes Wellington Hampshire, MD  loratadine (CLARITIN) 10 MG tablet Take 1 tablet (10 mg total) by mouth daily as needed for allergies. Patient not taking: Reported on 12/01/2017 09/03/17   [name], MD    Family History Family History  Problem Relation Age of Onset  . Pneumonia Father   . Stroke Brother   . Stroke Sister   . Cancer Brother        type unknown    Social History Social History   Tobacco Use  . Smoking status: Former Smoker    Packs/day: 1.50    Years: 15.00    Pack years: 22.50    Last attempt to quit: 04/16/1955    Years since quitting: 62.6  . Smokeless tobacco: Former Systems developer    Types: Chew    Quit date: 08/17/1952  Substance Use Topics  . Alcohol use: No    Comment: 08/14/2016 "drank beer when I was a teenager"  . Drug use: No     Allergies   Patient has no known allergies.   Review of Systems Review of Systems    Constitutional: Positive for fatigue. Negative for chills and fever.  HENT: Negative for congestion and rhinorrhea.   Eyes: Negative for visual disturbance.  Respiratory: Negative for cough, shortness of breath and wheezing.   Cardiovascular: Negative for chest pain and leg swelling.  Gastrointestinal: Positive for abdominal pain, diarrhea and nausea. Negative for vomiting.  Genitourinary: Positive for decreased urine volume. Negative for dysuria and flank pain.  Musculoskeletal: Negative for neck pain and neck stiffness.  Skin: Negative for rash and wound.  Allergic/Immunologic: Negative for immunocompromised state.  Neurological: Positive for weakness. Negative for syncope and headaches.  All other systems reviewed and are negative.    Physical Exam Updated Vital Signs BP (!) 145/98 (BP Location: Right Arm)   Pulse 93   Temp 97.6 F (36.4 C) (  Oral)   Resp 19   SpO2 98%   Physical Exam  Constitutional: He is oriented to person, place, and time. He appears well-developed and well-nourished. No distress.  HENT:  Head: Normocephalic and atraumatic.  Markedly dry mucous membranes  Eyes: Conjunctivae are normal.  Neck: Neck supple.  Cardiovascular: Normal rate, regular rhythm and normal heart sounds. Exam reveals no friction rub.  No murmur heard. Pulmonary/Chest: Effort normal and breath sounds normal. No respiratory distress. He has no wheezes. He has no rales.  Abdominal: He exhibits no distension.  Soft, nontender, nondistended  Musculoskeletal: He exhibits no edema.  Neurological: He is alert and oriented to person, place, and time. He exhibits normal muscle tone.  Skin: Skin is warm. Capillary refill takes less than 2 seconds.  Psychiatric: He has a normal mood and affect.  Nursing note and vitals reviewed.    ED Treatments / Results  Labs (all labs ordered are listed, but only abnormal results are displayed) Labs Reviewed  LIPASE, BLOOD - Abnormal; Notable for the  following components:      Result Value   Lipase 55 (*)    All other components within normal limits  COMPREHENSIVE METABOLIC PANEL - Abnormal; Notable for the following components:   Potassium 5.2 (*)    Chloride 117 (*)    CO2 18 (*)    BUN 35 (*)    Creatinine, Ser 2.03 (*)    GFR calc non Af Amer 27 (*)    GFR calc Af Amer 31 (*)    All other components within normal limits  CBC - Abnormal; Notable for the following components:   RBC 4.10 (*)    Hemoglobin 10.7 (*)    HCT 32.1 (*)    RDW 17.3 (*)    All other components within normal limits  URINALYSIS, ROUTINE W REFLEX MICROSCOPIC  I-STAT CG4 LACTIC ACID, ED  I-STAT CG4 LACTIC ACID, ED    EKG EKG Interpretation  Date/Time:  Monday December 01 2017 15:41:50 EDT Ventricular Rate:  76 PR Interval:    QRS Duration: 97 QT Interval:  393 QTC Calculation: 471 R Axis:   -30 Text Interpretation:  Afib/flut and V-paced complexes No further rhythm analysis attempted due to paced rhythm Left axis deviation Nonspecific repol abnormality, diffuse leads Since last EKG, pacred rhythm has primarily replaced AFib Otherwise no significant change Confirmed by Duffy Bruce [phone]) on 12/01/2017 3:57:39 PM   Radiology Ct Abdomen Pelvis Wo Contrast  Result Date: 12/01/2017 CLINICAL DATA:  Dehydration, lack of appetite and indigestion. EXAM: CT ABDOMEN AND PELVIS WITHOUT CONTRAST TECHNIQUE: Multidetector CT imaging of the abdomen and pelvis was performed following the standard protocol without IV contrast. COMPARISON:  CT of the abdomen and pelvis with contrast on 01/12/2017 FINDINGS: Lower chest: No acute abnormality. Hepatobiliary: No focal liver abnormality is seen. Status post cholecystectomy. No biliary dilatation. Pancreas: Atrophic pancreas. No evidence of pancreatic inflammation, mass or ductal dilatation. Spleen: Normal in size without focal abnormality. Adrenals/Urinary Tract: No adrenal masses. The kidneys appear atrophic, especially  the right. No hydronephrosis. The bladder appears unremarkable. Stomach/Bowel: No evidence of bowel obstruction or significant ileus. Stable moderate-sized hiatal hernia. Stable right-sided ventral hernia defect containing a segment of the transverse colon without signs of incarceration. Stable second smaller right ventral hernia defect just to the right of the umbilicus containing a segment of small bowel without signs of incarceration. Anastomotic suture line again evident in the right lower quadrant related to prior small bowel resection. No  free air. Vascular/Lymphatic: No significant vascular findings are present. No enlarged abdominal or pelvic lymph nodes. Reproductive: Prostate is unremarkable. Other: No ascites or focal abscess identified. Musculoskeletal: No acute or significant osseous findings. IMPRESSION: No significant acute findings. Stable ventral hernias of the right abdominal wall containing a segment of the transverse colon and small bowel. Bowel at the level of both hernias demonstrate no evidence of incarceration. Electronically Signed   By: Aletta Edouard M.D.   On: 12/01/2017 16:21    Procedures Procedures (including critical care time)  Medications Ordered in ED Medications  sodium chloride 0.9 % bolus 1,000 mL (1,000 mLs Intravenous New Bag/Given 12/01/17 1554)     Initial Impression / Assessment and Plan / ED Course  I have reviewed the triage vital signs and the nursing notes.  Pertinent labs & imaging results that were available during my care of the patient were reviewed by me and considered in my medical decision making (see chart for details).     84 year old male here with generalized weakness in the setting of poor p.o. intake and diarrhea.  CT imaging negative for surgical abnormality.  His lab work shows acute on chronic kidney injury with moderate dehydration.  Patient weak and unable to ambulate and is in house.  Is been trying symptomatic control at home for  the last week.  Will start fluids, plan to admit for rehydration, may need PT and OT eval.  Final Clinical Impressions(s) / ED Diagnoses   Final diagnoses:  Dehydration  AKI (acute kidney injury) [facility])    ED Discharge Orders    None       Duffy Bruce, MD 12/01/17 [phone]

## 2017-12-01 NOTE — H&P (Signed)
History and Physical    [name] CNO:[id number] DOB: November 22, 1923  DOA: 12/01/2017 PCP: [name], MD  Patient coming from: Home  Chief Complaint: Weakness   HPI: [name] is a 84 y.o. male with medical history significant of Afib on Xarelto, HTN and anemia presented to the emergency department progressively worsening weakness for about a month or so, he is also complaining of nausea and having diarrhea, associated symptoms include poor appetite and dizziness upon standing.  Patient was seen by primary care physician about a week ago where he was having the same complaints and he was advised to come to the emergency department, however patient declined it.  Her symptoms noted her dyspnea on exertion, and sometimes intermittent abdominal cramping.  Patient denies any weight loss, bleeding or falls.  ED Course: Upon ED evaluation patient was complaining of abdominal pain, CT of the abdomen was done and negative for acute abnormality, lab work-up shows elevated creatinine to 2.0, elevated potassium to 5.2, chloride at 117, CO2 18 and mild elevated lipase of 55.  Lactic acid 1.3.  Hemoglobin 10.7.  Patient started on IV fluid and admitted for rehydration and PT/OT eval.   Review of Systems:  All systems reviewed and negative except the one mentioned as above.  Past Medical History:  Diagnosis Date  . Arthritis   . Atrial fibrillation ([place])   . CKD (chronic kidney disease)   . History of hiatal hernia   . Hx SBO    Last 2013 all treated conservatively  . Hypertension   . Presence of permanent cardiac pacemaker   . Prostate cancer ([place])   . Stroke [facility]) 2017   left facial drooping noted 08/14/2016  . Tachycardia-bradycardia syndrome ([place])   . Type II diabetes mellitus ([place])   . Ventral hernia    x3    Past Surgical History:  Procedure Laterality Date  . ABDOMINAL AORTOGRAM W/LOWER EXTREMITY N/A 08/07/2016   Procedure: Abdominal Aortogram w/Lower Extremity;   Surgeon: Wellington Hampshire, MD;  Location: [place] CV LAB;  Service: Cardiovascular;  Laterality: N/A;  . CHOLECYSTECTOMY OPEN  03/31/2001   Dr Lindon Romp  . COLON SURGERY    . EXPLORATORY LAPAROTOMY  08/2004   Archie Endo 10/01/2010  . HEMORRHOID SURGERY    . HERNIA REPAIR    . HIATAL HERNIA REPAIR  2002  . INCISIONAL HERNIA REPAIR  08/2004   Archie Endo 10/01/2010  . INSERT / REPLACE / REMOVE PACEMAKER    . PERIPHERAL VASCULAR BALLOON ANGIOPLASTY  08/07/2016   Procedure: Peripheral Vascular Balloon Angioplasty;  Surgeon: Wellington Hampshire, MD;  Location: [place] CV LAB;  Service: Cardiovascular;;  left popliteal artery  . PERIPHERAL VASCULAR INTERVENTION  08/14/2016   popliteal artery/notes 08/14/2016  . PERIPHERAL VASCULAR INTERVENTION Left 08/14/2016   Procedure: Peripheral Vascular Intervention;  Surgeon: Wellington Hampshire, MD;  Location: [place] CV LAB;  Service: Cardiovascular;  Laterality: Left;  popliteal artery  . PERMANENT PACEMAKER GENERATOR CHANGE N/A 04/06/2012   Procedure: PERMANENT PACEMAKER GENERATOR CHANGE;  Surgeon: Evans Lance, MD;  Location: [facility] CATH LAB;  Service: Cardiovascular;  Laterality: N/A;  . SMALL INTESTINE SURGERY  09/16/2004   SBO resection, VH repair     reports that he quit smoking about 62 years ago. He has a 22.50 pack-year smoking history. He quit smokeless tobacco use about 65 years ago. His smokeless tobacco use included chew. He reports that he does not drink alcohol or use drugs.  No Known Allergies  Family History  Problem Relation Age of Onset  . Pneumonia Father   . Stroke Brother   . Stroke Sister   . Cancer Brother        type unknown   Family history reviewed and not pertinent   Prior to Admission medications   Medication Sig Start Date End Date Taking? Authorizing Provider  ferrous sulfate 325 (65 FE) MG tablet Take 1 tablet (325 mg total) by mouth daily with breakfast. 10/17/17  Yes [name], MD    losartan-hydrochlorothiazide [facility]) 100-12.5 MG tablet Take 1 tablet by mouth daily. 10/16/17  Yes [name], MD  megestrol (MEGACE) 400 MG/10ML suspension Take 10 mLs (400 mg total) by mouth daily. 10/16/17  Yes [name], MD  metoprolol succinate (TOPROL-XL) 50 MG 24 hr tablet Take 50 mg by mouth every evening.   Yes [provider]  pantoprazole (PROTONIX) 40 MG tablet Take 1 tablet (40 mg total) by mouth daily. 08/15/16  Yes Barrett, Evelene Croon, PA-C  Rivaroxaban (XARELTO) 15 MG TABS tablet Take 1 tablet (15 mg total) by mouth daily with supper. 08/11/17  Yes Wellington Hampshire, MD  loratadine (CLARITIN) 10 MG tablet Take 1 tablet (10 mg total) by mouth daily as needed for allergies. Patient not taking: Reported on 12/01/2017 09/03/17   [name], MD    Physical Exam: Vitals:   12/01/17 1247 12/01/17 1503 12/01/17 1633  BP: 115/62 114/68 (!) 145/98  Pulse: 80 72 93  Resp: 20 15 19   Temp: 97.6 F (36.4 C)    TempSrc: Oral    SpO2: 100% 100% 98%     Constitutional: NAD  Eyes: PERRL, lids and conjunctivae normal ENMT: Mucous membranes are dry.  Neck: normal, supple, no masses, no thyromegaly Respiratory: clear to auscultation bilaterally, no wheezing, no crackles. Normal respiratory effort. Cardiovascular: Irr Irr S1S2, no murmurs / rubs / gallops. No extremity edema. 2+ pedal pulses. Abdomen: no tenderness, no masses palpated. No hepatosplenomegaly. Bowel sounds positive.  Musculoskeletal: no clubbing / cyanosis. No joint deformity upper and lower extremities. Good ROM Skin: no rashes Neurologic: CN 2-12 grossly intact.  Psychiatric: Alert and oriented x 3. Normal mood.    Labs on Admission: I have personally reviewed following labs and imaging studies  CBC: Recent Labs  Lab 12/01/17 1306  WBC 7.8  HGB 10.7*  HCT 32.1*  MCV 78.3  PLT 423   Basic Metabolic Panel: Recent Labs  Lab 12/01/17 1306  NA 142  K 5.2*  CL 117*   CO2 18*  GLUCOSE 79  BUN 35*  CREATININE 2.03*  CALCIUM 9.0   GFR: CrCl cannot be calculated (Unknown ideal weight.). Liver Function Tests: Recent Labs  Lab 12/01/17 1306  AST 19  ALT 11  ALKPHOS 62  BILITOT 0.6  PROT 6.8  ALBUMIN 3.7   Recent Labs  Lab 12/01/17 1306  LIPASE 55*   No results for input(s): AMMONIA in the last 168 hours. Coagulation Profile: No results for input(s): INR, PROTIME in the last 168 hours. Cardiac Enzymes: No results for input(s): CKTOTAL, CKMB, CKMBINDEX, TROPONINI in the last 168 hours. BNP (last 3 results) No results for input(s): PROBNP in the last 8760 hours. HbA1C: No results for input(s): HGBA1C in the last 72 hours. CBG: No results for input(s): GLUCAP in the last 168 hours. Lipid Profile: No results for input(s): CHOL, HDL, LDLCALC, TRIG, CHOLHDL, LDLDIRECT in the last 72 hours. Thyroid Function Tests: No results for input(s): TSH, T4TOTAL,  FREET4, T3FREE, THYROIDAB in the last 72 hours. Anemia Panel: No results for input(s): VITAMINB12, FOLATE, FERRITIN, TIBC, IRON, RETICCTPCT in the last 72 hours. Urine analysis:    Component Value Date/Time   COLORURINE YELLOW 12/01/2017 1539   APPEARANCEUR CLEAR 12/01/2017 1539   LABSPEC 1.011 12/01/2017 1539   PHURINE 5.0 12/01/2017 1539   GLUCOSEU NEGATIVE 12/01/2017 1539   GLUCOSEU NEGATIVE 09/03/2017 1108   HGBUR NEGATIVE 12/01/2017 1539   BILIRUBINUR NEGATIVE 12/01/2017 1539   KETONESUR NEGATIVE 12/01/2017 1539   PROTEINUR NEGATIVE 12/01/2017 1539   UROBILINOGEN 0.2 09/03/2017 1108   NITRITE NEGATIVE 12/01/2017 1539   LEUKOCYTESUR NEGATIVE 12/01/2017 1539   Sepsis Labs: !!!!!!!!!!!!!!!!!!!!!!!!!!!!!!!!!!!!!!!!!!!! @LABRCNTIP (procalcitonin:4,lacticidven:4) )No results found for this or any previous visit (from the past 240 hour(s)).   Radiological Exams on Admission: Ct Abdomen Pelvis Wo Contrast  Result Date: 12/01/2017 CLINICAL DATA:  Dehydration, lack of appetite and  indigestion. EXAM: CT ABDOMEN AND PELVIS WITHOUT CONTRAST TECHNIQUE: Multidetector CT imaging of the abdomen and pelvis was performed following the standard protocol without IV contrast. COMPARISON:  CT of the abdomen and pelvis with contrast on 01/12/2017 FINDINGS: Lower chest: No acute abnormality. Hepatobiliary: No focal liver abnormality is seen. Status post cholecystectomy. No biliary dilatation. Pancreas: Atrophic pancreas. No evidence of pancreatic inflammation, mass or ductal dilatation. Spleen: Normal in size without focal abnormality. Adrenals/Urinary Tract: No adrenal masses. The kidneys appear atrophic, especially the right. No hydronephrosis. The bladder appears unremarkable. Stomach/Bowel: No evidence of bowel obstruction or significant ileus. Stable moderate-sized hiatal hernia. Stable right-sided ventral hernia defect containing a segment of the transverse colon without signs of incarceration. Stable second smaller right ventral hernia defect just to the right of the umbilicus containing a segment of small bowel without signs of incarceration. Anastomotic suture line again evident in the right lower quadrant related to prior small bowel resection. No free air. Vascular/Lymphatic: No significant vascular findings are present. No enlarged abdominal or pelvic lymph nodes. Reproductive: Prostate is unremarkable. Other: No ascites or focal abscess identified. Musculoskeletal: No acute or significant osseous findings. IMPRESSION: No significant acute findings. Stable ventral hernias of the right abdominal wall containing a segment of the transverse colon and small bowel. Bowel at the level of both hernias demonstrate no evidence of incarceration. Electronically Signed   By: Aletta Edouard M.D.   On: 12/01/2017 16:21    EKG: ? Afib with multiple PVC's, Paced rhythm. No ischemic changes   Assessment/Plan AKI on CKD stage III Placed on Obs  Felt to be secondary to dehydration from poor oral intake  and continuation of Hyzaar. Baseline Cr 1.44 Will start IVF, avoid nephrotoxic agents and hypotension. Monitor BMP in AM.   Mild Hyperkalemia  Will give a dose of Lasix and continue IVF  Check Mag in Am   Afib  HR well controlled, resume metoprolol and Xarelto   HTN  BP well controlled, holding Hyzaar due to AKI  Check orthostatics   General deconditioning due to poor oral intake  Will add Remeron to help with appetite. Nutrition, PT and OT consult  Baseline live alone and ambulatory  DVT prophylaxis: Xarelto  Code Status: Full  Family Communication: None  Disposition Plan: Anticipate discharge to previous home environment.  Consults called: None  Admission status: MedSurg/ Obs    Chipper Oman MD [facility] Pager: Text Page via [email or web address]  [phone]  If 7PM-7AM, please contact night-coverage [email or web address] Password [facility]  12/01/2017, 5:32 PM

## 2017-12-01 NOTE — ED Triage Notes (Addendum)
Pt family reports that he dehydrated not been eating and drinking as much. Only has part of stomach so has diarrhea. [name] was thirsty today and started asking for water.   Family reports pt hasnt been taking iron pill. Pt dont c/o pain but holds his stomach a lot.  Pt states that he feels like he has indigestion.  Pt not currently getting chemo or radiation

## 2017-12-02 DIAGNOSIS — N179 Acute kidney failure, unspecified: Secondary | ICD-10-CM | POA: Diagnosis not present

## 2017-12-02 LAB — BASIC METABOLIC PANEL
Anion gap: 7 (ref 5–15)
BUN: 29 mg/dL — ABNORMAL HIGH (ref 8–23)
CHLORIDE: 117 mmol/L — AB (ref 98–111)
CO2: 15 mmol/L — ABNORMAL LOW (ref 22–32)
CREATININE: 1.74 mg/dL — AB (ref 0.61–1.24)
Calcium: 8.1 mg/dL — ABNORMAL LOW (ref 8.9–10.3)
GFR, EST AFRICAN AMERICAN: 38 mL/min — AB (ref >60)
GFR, EST NON AFRICAN AMERICAN: 33 mL/min — AB (ref >60)
Glucose, Bld: 93 mg/dL (ref 70–99)
POTASSIUM: 5.4 mmol/L — AB (ref 3.5–5.1)
SODIUM: 139 mmol/L (ref 135–145)

## 2017-12-02 LAB — CBC
HCT: 31.5 % — ABNORMAL LOW (ref 39.0–52.0)
Hemoglobin: 10.8 g/dL — ABNORMAL LOW (ref 13.0–17.0)
MCH: 26.5 pg (ref 26.0–34.0)
MCHC: 34.3 g/dL (ref 30.0–36.0)
MCV: 77.2 fL — AB (ref 78.0–100.0)
PLATELETS: 159 10*3/uL (ref 150–400)
RBC: 4.08 MIL/uL — AB (ref 4.22–5.81)
RDW: 17.4 % — ABNORMAL HIGH (ref 11.5–15.5)
WBC: 7.8 10*3/uL (ref 4.0–10.5)

## 2017-12-02 LAB — MAGNESIUM: Magnesium: 1.9 mg/dL (ref 1.7–2.4)

## 2017-12-02 MED ORDER — SODIUM POLYSTYRENE SULFONATE 15 GM/60ML PO SUSP
15.0000 g | Freq: Once | ORAL | Status: AC
  Administered 2017-12-02: 15 g via ORAL
  Filled 2017-12-02: qty 60

## 2017-12-02 MED ORDER — MUSCLE RUB 10-15 % EX CREA
TOPICAL_CREAM | CUTANEOUS | Status: DC | PRN
  Filled 2017-12-02: qty 85

## 2017-12-02 NOTE — Progress Notes (Signed)
PROGRESS NOTE [facility]   NIQUAN CHARNLEY   CXK:[id number] DOB: 06/02/1923  DOA: 12/01/2017 PCP: [name], MD   Brief Narrative:  [name] is a 84 y.o. male with medical history significant of Afib on Xarelto, HTN and anemia presented to the emergency department progressively worsening weakness for about a month or so, he is also complaining of nausea and having diarrhea, associated symptoms include poor appetite and dizziness upon standing.  Patient was seen by primary care physician about a week ago where he was having the same complaints and he was advised to come to the emergency department, however patient declined it.  Her symptoms noted her dyspnea on exertion, and sometimes intermittent abdominal cramping.  Patient denies any weight loss, bleeding or falls.  Subjective: Patient seen and examined, he is feeling much better however K increased, renal function mildly improved. Denies weakness and dizziness. No acute events overnight.  Assessment & Plan: AKI on CKD stage III Felt to be secondary to dehydration from poor oral intake and continuation of Hyzaar. Baseline Cr 1.44, Cr trending down, however still hyperkalemic, will cont. IVF, avoid nephrotoxic agents and hypotension. Monitor BMP in AM if stable may d/c in AM.  Mild Hyperkalemia  Treated with one dose of lasix and IVF, no improvement, will give Kayexalate x 1  Check BMP and Mag in AM     Afib  HR well controlled, continue metoprolol and Xarelto   HTN  BP soft, holding Hyzaar due to AKI  Orthostatics negative   General deconditioning due to poor oral intake/Moderate malnutrition  Continue Remeron to help with appetite. Continue protein supp. PT and OT no follow up. Baseline live alone and ambulatory  DVT prophylaxis: Heparin Sq Code Status:  Full Code Family Communication: None at bedside  Disposition Plan: Home in AM if Cr stable   Consultants:   None   Procedures:   None    Antimicrobials:  None    Objective: Vitals:   12/01/17 1830 12/01/17 1913 12/02/17 0626 12/02/17 1430  BP: (!) 168/91 137/84 (!) 86/58 97/64  Pulse: 86 65 (!) 59 62  Resp: 18 16 15 15   Temp: 97.8 F (36.6 C) 98.2 F (36.8 C) 99 F (37.2 C) 98.8 F (37.1 C)  TempSrc: Oral Oral Oral Oral  SpO2: 100% 100% 98% 97%    Intake/Output Summary (Last 24 hours) at 12/02/2017 1728 Last data filed at 12/02/2017 1602 Gross per 24 hour  Intake 2913.67 ml  Output 1700 ml  Net 1213.67 ml   There were no vitals filed for this visit.  Examination:  General: Pt is alert, awake, not in acute distress, hard hearing  Cardiovascular: RRR, S1/S2 +, no rubs, no gallops Respiratory: CTA bilaterally, no wheezing, no rhonchi Abdominal: Soft, NT, ND, bowel sounds +, large umbilical hernia  Extremities: no edema, no cyanosis  Data Reviewed: I have personally reviewed following labs and imaging studies  CBC: Recent Labs  Lab 12/01/17 1306 12/02/17 0458  WBC 7.8 7.8  HGB 10.7* 10.8*  HCT 32.1* 31.5*  MCV 78.3 77.2*  PLT 192 970   Basic Metabolic Panel: Recent Labs  Lab 12/01/17 1306 12/02/17 0458  NA 142 139  K 5.2* 5.4*  CL 117* 117*  CO2 18* 15*  GLUCOSE 79 93  BUN 35* 29*  CREATININE 2.03* 1.74*  CALCIUM 9.0 8.1*  MG  --  1.9   GFR: CrCl cannot be calculated (Unknown ideal weight.). Liver Function Tests: Recent Labs  Lab  12/01/17 1306  AST 19  ALT 11  ALKPHOS 62  BILITOT 0.6  PROT 6.8  ALBUMIN 3.7   Recent Labs  Lab 12/01/17 1306  LIPASE 55*   No results for input(s): AMMONIA in the last 168 hours. Coagulation Profile: No results for input(s): INR, PROTIME in the last 168 hours. Cardiac Enzymes: No results for input(s): CKTOTAL, CKMB, CKMBINDEX, TROPONINI in the last 168 hours. BNP (last 3 results) No results for input(s): PROBNP in the last 8760 hours. HbA1C: No results for input(s): HGBA1C in the last 72 hours. CBG: No results for input(s): GLUCAP in  the last 168 hours. Lipid Profile: No results for input(s): CHOL, HDL, LDLCALC, TRIG, CHOLHDL, LDLDIRECT in the last 72 hours. Thyroid Function Tests: No results for input(s): TSH, T4TOTAL, FREET4, T3FREE, THYROIDAB in the last 72 hours. Anemia Panel: No results for input(s): VITAMINB12, FOLATE, FERRITIN, TIBC, IRON, RETICCTPCT in the last 72 hours. Sepsis Labs: Recent Labs  Lab 12/01/17 1552  LATICACIDVEN 1.35    No results found for this or any previous visit (from the past 240 hour(s)).    Radiology Studies: Ct Abdomen Pelvis Wo Contrast  Result Date: 12/01/2017 CLINICAL DATA:  Dehydration, lack of appetite and indigestion. EXAM: CT ABDOMEN AND PELVIS WITHOUT CONTRAST TECHNIQUE: Multidetector CT imaging of the abdomen and pelvis was performed following the standard protocol without IV contrast. COMPARISON:  CT of the abdomen and pelvis with contrast on 01/12/2017 FINDINGS: Lower chest: No acute abnormality. Hepatobiliary: No focal liver abnormality is seen. Status post cholecystectomy. No biliary dilatation. Pancreas: Atrophic pancreas. No evidence of pancreatic inflammation, mass or ductal dilatation. Spleen: Normal in size without focal abnormality. Adrenals/Urinary Tract: No adrenal masses. The kidneys appear atrophic, especially the right. No hydronephrosis. The bladder appears unremarkable. Stomach/Bowel: No evidence of bowel obstruction or significant ileus. Stable moderate-sized hiatal hernia. Stable right-sided ventral hernia defect containing a segment of the transverse colon without signs of incarceration. Stable second smaller right ventral hernia defect just to the right of the umbilicus containing a segment of small bowel without signs of incarceration. Anastomotic suture line again evident in the right lower quadrant related to prior small bowel resection. No free air. Vascular/Lymphatic: No significant vascular findings are present. No enlarged abdominal or pelvic lymph nodes.  Reproductive: Prostate is unremarkable. Other: No ascites or focal abscess identified. Musculoskeletal: No acute or significant osseous findings. IMPRESSION: No significant acute findings. Stable ventral hernias of the right abdominal wall containing a segment of the transverse colon and small bowel. Bowel at the level of both hernias demonstrate no evidence of incarceration. Electronically Signed   By: Aletta Edouard M.D.   On: 12/01/2017 16:21    Scheduled Meds: . feeding supplement  1 Container Oral TID BM  . metoprolol succinate  50 mg Oral QPM  . mirtazapine  7.5 mg Oral QHS  . pantoprazole  40 mg Oral Daily  . Rivaroxaban  15 mg Oral Q supper   Continuous Infusions: . sodium chloride 1,000 mL (12/02/17 0536)     LOS: 0 days    Time spent: Total of 25 minutes spent with pt, greater than 50% of which was spent in discussion of  treatment, counseling and coordination of care   Chipper Oman, MD Pager: Text Page via [email or web address]   If 7PM-7AM, please contact night-coverage [email or web address] 12/02/2017, 5:28 PM   Note - This record has been created using [employer]. Chart creation errors have been sought, but may not always have been located. Such creation  errors do not reflect on the standard of medical care.

## 2017-12-02 NOTE — Evaluation (Signed)
Occupational Therapy Evaluation Patient Details Name: [name] MRN: [id number] DOB: 05-10-1924 Today's Date: 12/02/2017    History of Present Illness [name] is a 84 y.o. male with medical history significant of Afib on Xarelto, HTN and anemia presented to the emergency department progressively worsening weakness for about a month or so, he is also complaining of nausea and having diarrhea, associated symptoms include poor appetite and dizziness upon standing.  Patient was seen by primary care physician about a week ago where he was having the same complaints and he was advised to come to the emergency department, however patient declined it.  Her symptoms noted her dyspnea on exertion, and sometimes intermittent abdominal cramping   Clinical Impression   Pt admitted with the above. Pt currently with functional limitations due to the deficits listed below (see OT Problem List).  Pt will benefit from skilled OT to increase their safety and independence with ADL and functional mobility for ADL to facilitate discharge to venue listed below.      Follow Up Recommendations  No OT follow up;Supervision/Assistance - 24 hour    Equipment Recommendations  None recommended by OT    Recommendations for Other Services       Precautions / Restrictions Precautions Precautions: Fall      Mobility Bed Mobility               General bed mobility comments: pt in bed  Transfers Overall transfer level: Needs assistance Equipment used: 1 person hand held assist Transfers: Sit to/from Stand Sit to Stand: Min assist         General transfer comment: VC forhand placement        ADL either performed or assessed with clinical judgement   ADL      Pt overall S- min A with ADL activity this day.  Lunch came during OT eval and pt wanted to eat. Will further eval ADL activity                                          Vision Patient Visual Report: No  change from baseline       Perception     Praxis      Pertinent Vitals/Pain Pain Assessment: No/denies pain     Hand Dominance     Extremity/Trunk Assessment Upper Extremity Assessment Upper Extremity Assessment: Generalized weakness           Communication Communication Communication: HOH   Cognition Arousal/Alertness: Awake/alert Behavior During Therapy: WFL for tasks assessed/performed Overall Cognitive Status: Within Functional Limits for tasks assessed                                                Home Living Family/patient expects to be discharged to:: Private residence Living Arrangements: Alone Available Help at Discharge: Family;Available 24 hours/day   Home Access: Stairs to enter   Entrance Stairs-Rails: None Home Layout: One level     Bathroom Shower/Tub: Teacher, early years/pre: Standard     Home Equipment: Environmental consultant - 2 wheels          Prior Functioning/Environment Level of Independence: Independent        Comments: patient drives, independent.  OT Problem List: Decreased strength;Decreased activity tolerance;Impaired balance (sitting and/or standing)      OT Treatment/Interventions: Self-care/ADL training;Patient/family education;DME and/or AE instruction    OT Goals(Current goals can be found in the care plan section) Acute Rehab OT Goals Patient Stated Goal: home OT Goal Formulation: With patient Time For Goal Achievement: 12/09/17 Potential to Achieve Goals: Good  OT Frequency: Min 2X/week   Barriers to D/C:            Co-evaluation              AM-PAC PT "6 Clicks" Daily Activity     Outcome Measure Help from another person eating meals?: None Help from another person taking care of personal grooming?: A Little Help from another person toileting, which includes using toliet, bedpan, or urinal?: A Little Help from another person bathing (including washing, rinsing, drying)?: A  Little Help from another person to put on and taking off regular upper body clothing?: A Little Help from another person to put on and taking off regular lower body clothing?: A Little 6 Click Score: 19   End of Session Nurse Communication: Mobility status  Activity Tolerance: Patient tolerated treatment well Patient left: in chair;with call bell/phone within reach;with chair alarm set  OT Visit Diagnosis: Unsteadiness on feet (R26.81);History of falling (Z91.81);Other abnormalities of gait and mobility (R26.89)                  Charges:  OT General Charges $OT Visit: 1 Visit OT Evaluation $OT Eval Moderate Complexity: 1 Mod G-Codes:     Kari Baars, [place]  Payton Mccallum D 12/02/2017, 2:28 PM

## 2017-12-02 NOTE — Evaluation (Signed)
Physical Therapy Evaluation Patient Details Name: [name] MRN: [id number] DOB: Sep 07, 1923 Today's Date: 12/02/2017   History of Present Illness  84 y.o. male with medical history significant of Afib on Xarelto, HTN and anemia presented to the emergency department progressively worsening weakness for about a month or so, he is also complaining of nausea and having diarrhea, associated symptoms include poor appetite and dizziness upon standing.   Clinical Impression  Pt ambulated 200' with IV pole, no loss of balance. No assist needed for transfers. No further PT indicated as pt is mobilizing well. Will sign off.     Follow Up Recommendations No PT follow up    Equipment Recommendations  None recommended by PT    Recommendations for Other Services       Precautions / Restrictions Precautions Precautions: Fall Precaution Comments: pt denies h/o falls in past 1 year Restrictions Weight Bearing Restrictions: No      Mobility  Bed Mobility               General bed mobility comments: up in chair  Transfers Overall transfer level: Modified independent Equipment used: 1 person hand held assist Transfers: Sit to/from Stand Sit to Stand: Modified independent (Device/Increase time)         General transfer comment: used armrests, good hand placement  Ambulation/Gait Ambulation/Gait assistance: Modified independent (Device/Increase time) Gait Distance (Feet): 200 Feet Assistive device: IV Pole Gait Pattern/deviations: WFL(Within Functional Limits) Gait velocity: WFL   General Gait Details: steady, no loss of balance  Stairs            Wheelchair Mobility    Modified Rankin (Stroke Patients Only)       Balance Overall balance assessment: Modified Independent                                           Pertinent Vitals/Pain Pain Assessment: No/denies pain    Home Living Family/patient expects to be discharged to:: Private  residence Living Arrangements: Alone Available Help at Discharge: Family;Available 24 hours/day   Home Access: Stairs to enter Entrance Stairs-Rails: None   Home Layout: One level Home Equipment: Environmental consultant - 2 wheels      Prior Function Level of Independence: Independent         Comments: patient drives, independent ambulation, works in yard     Journalist, newspaper        Extremity/Trunk Assessment   Upper Extremity Assessment Upper Extremity Assessment: Defer to OT evaluation    Lower Extremity Assessment Lower Extremity Assessment: Overall WFL for tasks assessed       Communication   Communication: HOH  Cognition Arousal/Alertness: Awake/alert Behavior During Therapy: WFL for tasks assessed/performed Overall Cognitive Status: Within Functional Limits for tasks assessed                                        General Comments      Exercises     Assessment/Plan    PT Assessment Patent does not need any further PT services  PT Problem List         PT Treatment Interventions      PT Goals (Current goals can be found in the Care Plan section)  Acute Rehab PT Goals Patient Stated Goal: do yardwork PT Goal Formulation:  All assessment and education complete, DC therapy    Frequency     Barriers to discharge        Co-evaluation               AM-PAC PT "6 Clicks" Daily Activity  Outcome Measure Difficulty turning over in bed (including adjusting bedclothes, sheets and blankets)?: None Difficulty moving from lying on back to sitting on the side of the bed? : None Difficulty sitting down on and standing up from a chair with arms (e.g., wheelchair, bedside commode, etc,.)?: None Help needed moving to and from a bed to chair (including a wheelchair)?: None Help needed walking in hospital room?: None Help needed climbing 3-5 steps with a railing? : A Little 6 Click Score: 23    End of Session Equipment Utilized During Treatment: Gait  belt Activity Tolerance: Patient tolerated treatment well Patient left: in chair;with call bell/phone within reach;with chair alarm set Nurse Communication: Mobility status      Time: 7076-1518 PT Time Calculation (min) (ACUTE ONLY): 16 min   Charges:   PT Evaluation $PT Eval Low Complexity: 1 Low     PT G Codes:          Philomena Doheny 12/02/2017, 2:33 PM [phone]

## 2017-12-02 NOTE — Progress Notes (Signed)
Initial Nutrition Assessment  DOCUMENTATION CODES:   Non-severe (moderate) malnutrition in context of acute illness/injury  INTERVENTION:   -Needs weight recorded for admission -Continue Boost Breeze po TID, each supplement provides 250 kcal and 9 grams of protein  NUTRITION DIAGNOSIS:   Moderate Malnutrition related to acute illness, poor appetite, nausea as evidenced by moderate fat depletion, moderate muscle depletion.  GOAL:   Patient will meet greater than or equal to 90% of their needs  MONITOR:   PO intake, Supplement acceptance, Weight trends, Labs, I & O's  REASON FOR ASSESSMENT:   Consult Assessment of nutrition requirement/status  ASSESSMENT:   84 y.o. male with medical history significant of Afib on Xarelto, HTN and anemia presented to the emergency department progressively worsening weakness for about a month or so, he is also complaining of nausea and having diarrhea, associated symptoms include poor appetite and dizziness upon standing.  Patient in chair sleeping. Pt is able to waken but is HOH. Pt reports feeling hungry today and ate a good breakfast. Pt ate some pancakes and potatoes. Pt states he tends to eat 2 meals a day at home, typically breakfast and a late dinner. Pt doesn't drink protein drinks at home but was encouraged. Pt drinking Boost Breeze supplements today. Denies issues with chewing or swallowing (despite noticeably missing teeth).   Per chart review, no weight has been recorded for this admission. Unable to assess weight status at this time.   Medications: Remeron tablet daily Labs reviewed: Elevated K Mg WNL GFR: 38   NUTRITION - FOCUSED PHYSICAL EXAM:    Most Recent Value  Orbital Region  Mild depletion  Upper Arm Region  Moderate depletion  Thoracic and Lumbar Region  Unable to assess  Buccal Region  Mild depletion  Temple Region  Mild depletion  Clavicle Bone Region  Moderate depletion  Clavicle and Acromion Bone Region   Moderate depletion  Scapular Bone Region  Unable to assess  Dorsal Hand  Moderate depletion  Patellar Region  Unable to assess  Anterior Thigh Region  Unable to assess  Posterior Calf Region  Unable to assess  Edema (RD Assessment)  None       Diet Order:   Diet Order           Diet Heart Room service appropriate? Yes; Fluid consistency: Thin  Diet effective now          EDUCATION NEEDS:   Education needs have been addressed  Skin:  Skin Assessment: Reviewed RN Assessment  Last BM:  7/15  Height:   Ht Readings from Last 1 Encounters:  11/25/17 5\' 11"  (1.803 m)    Weight:   Wt Readings from Last 1 Encounters:  10/16/17 153 lb (69.4 kg)    Ideal Body Weight:  78.2 kg  BMI:  There is no height or weight on file to calculate BMI.  Estimated Nutritional Needs:   Kcal:  9169-4503  Protein:  80-90g  Fluid:  1.9L/day   Clayton Bibles, MS, RD, LDN [place] [facility] Pager: [phone] After Hours Pager: [phone]

## 2017-12-03 DIAGNOSIS — I129 Hypertensive chronic kidney disease with stage 1 through stage 4 chronic kidney disease, or unspecified chronic kidney disease: Secondary | ICD-10-CM | POA: Diagnosis present

## 2017-12-03 DIAGNOSIS — E1122 Type 2 diabetes mellitus with diabetic chronic kidney disease: Secondary | ICD-10-CM | POA: Diagnosis present

## 2017-12-03 DIAGNOSIS — E872 Acidosis: Secondary | ICD-10-CM | POA: Diagnosis present

## 2017-12-03 DIAGNOSIS — N183 Chronic kidney disease, stage 3 (moderate): Secondary | ICD-10-CM | POA: Diagnosis present

## 2017-12-03 DIAGNOSIS — E860 Dehydration: Secondary | ICD-10-CM

## 2017-12-03 DIAGNOSIS — K56609 Unspecified intestinal obstruction, unspecified as to partial versus complete obstruction: Secondary | ICD-10-CM | POA: Diagnosis present

## 2017-12-03 DIAGNOSIS — I4891 Unspecified atrial fibrillation: Secondary | ICD-10-CM | POA: Diagnosis present

## 2017-12-03 DIAGNOSIS — D509 Iron deficiency anemia, unspecified: Secondary | ICD-10-CM | POA: Diagnosis present

## 2017-12-03 DIAGNOSIS — E440 Moderate protein-calorie malnutrition: Secondary | ICD-10-CM | POA: Diagnosis present

## 2017-12-03 DIAGNOSIS — Z8546 Personal history of malignant neoplasm of prostate: Secondary | ICD-10-CM | POA: Diagnosis not present

## 2017-12-03 DIAGNOSIS — E878 Other disorders of electrolyte and fluid balance, not elsewhere classified: Secondary | ICD-10-CM | POA: Diagnosis present

## 2017-12-03 DIAGNOSIS — R55 Syncope and collapse: Secondary | ICD-10-CM | POA: Diagnosis present

## 2017-12-03 DIAGNOSIS — N179 Acute kidney failure, unspecified: Principal | ICD-10-CM

## 2017-12-03 DIAGNOSIS — Z950 Presence of cardiac pacemaker: Secondary | ICD-10-CM | POA: Diagnosis not present

## 2017-12-03 DIAGNOSIS — Z6821 Body mass index (BMI) 21.0-21.9, adult: Secondary | ICD-10-CM | POA: Diagnosis not present

## 2017-12-03 DIAGNOSIS — Z7901 Long term (current) use of anticoagulants: Secondary | ICD-10-CM | POA: Diagnosis not present

## 2017-12-03 DIAGNOSIS — Z87891 Personal history of nicotine dependence: Secondary | ICD-10-CM | POA: Diagnosis not present

## 2017-12-03 DIAGNOSIS — M1990 Unspecified osteoarthritis, unspecified site: Secondary | ICD-10-CM | POA: Diagnosis present

## 2017-12-03 DIAGNOSIS — Z79899 Other long term (current) drug therapy: Secondary | ICD-10-CM | POA: Diagnosis not present

## 2017-12-03 DIAGNOSIS — I495 Sick sinus syndrome: Secondary | ICD-10-CM | POA: Diagnosis present

## 2017-12-03 DIAGNOSIS — E875 Hyperkalemia: Secondary | ICD-10-CM | POA: Diagnosis present

## 2017-12-03 DIAGNOSIS — Z8673 Personal history of transient ischemic attack (TIA), and cerebral infarction without residual deficits: Secondary | ICD-10-CM | POA: Diagnosis not present

## 2017-12-03 DIAGNOSIS — I739 Peripheral vascular disease, unspecified: Secondary | ICD-10-CM | POA: Diagnosis present

## 2017-12-03 DIAGNOSIS — Z823 Family history of stroke: Secondary | ICD-10-CM | POA: Diagnosis not present

## 2017-12-03 DIAGNOSIS — Z9049 Acquired absence of other specified parts of digestive tract: Secondary | ICD-10-CM | POA: Diagnosis not present

## 2017-12-03 LAB — BASIC METABOLIC PANEL
ANION GAP: 7 (ref 5–15)
BUN: 27 mg/dL — AB (ref 8–23)
CHLORIDE: 121 mmol/L — AB (ref 98–111)
CO2: 15 mmol/L — AB (ref 22–32)
CREATININE: 1.79 mg/dL — AB (ref 0.61–1.24)
Calcium: 8 mg/dL — ABNORMAL LOW (ref 8.9–10.3)
GFR calc non Af Amer: 31 mL/min — ABNORMAL LOW (ref >60)
GFR, EST AFRICAN AMERICAN: 36 mL/min — AB (ref >60)
Glucose, Bld: 95 mg/dL (ref 70–99)
POTASSIUM: 4.2 mmol/L (ref 3.5–5.1)
Sodium: 143 mmol/L (ref 135–145)

## 2017-12-03 LAB — MAGNESIUM: MAGNESIUM: 1.5 mg/dL — AB (ref 1.7–2.4)

## 2017-12-03 MED ORDER — DOCUSATE SODIUM 100 MG PO CAPS
200.0000 mg | ORAL_CAPSULE | Freq: Every day | ORAL | Status: DC
  Administered 2017-12-03 – 2017-12-04 (×2): 200 mg via ORAL
  Filled 2017-12-03 (×2): qty 2

## 2017-12-03 MED ORDER — SODIUM BICARBONATE 650 MG PO TABS
650.0000 mg | ORAL_TABLET | Freq: Two times a day (BID) | ORAL | Status: DC
  Administered 2017-12-03 – 2017-12-04 (×3): 650 mg via ORAL
  Filled 2017-12-03 (×3): qty 1

## 2017-12-03 MED ORDER — METOPROLOL SUCCINATE ER 25 MG PO TB24
25.0000 mg | ORAL_TABLET | Freq: Every evening | ORAL | Status: DC
  Administered 2017-12-03 – 2017-12-04 (×2): 25 mg via ORAL
  Filled 2017-12-03 (×2): qty 1

## 2017-12-03 MED ORDER — SODIUM BICARBONATE 650 MG PO TABS: 650.0000 mg | ORAL_TABLET | Freq: Every day | ORAL | Status: DC

## 2017-12-03 MED ORDER — SIMETHICONE 80 MG PO CHEW
160.0000 mg | CHEWABLE_TABLET | Freq: Once | ORAL | Status: AC
  Administered 2017-12-03: 160 mg via ORAL
  Filled 2017-12-03: qty 2

## 2017-12-03 MED ORDER — MAGNESIUM SULFATE 2 GM/50ML IV SOLN
2.0000 g | Freq: Once | INTRAVENOUS | Status: AC
  Administered 2017-12-03: 2 g via INTRAVENOUS
  Filled 2017-12-03: qty 50

## 2017-12-03 NOTE — Progress Notes (Signed)
Occupational Therapy Treatment Patient Details Name: [name] MRN: [id number] DOB: Apr 26, 1924 Today's Date: 12/03/2017    History of present illness 84 y.o. male with medical history significant of Afib on Xarelto, HTN and anemia presented to the emergency department progressively worsening weakness for about a month or so, he is also complaining of nausea and having diarrhea, associated symptoms include poor appetite and dizziness upon standing.    OT comments  Pt agreed to OOB  Follow Up Recommendations  No OT follow up;Supervision/Assistance - 24 hour    Equipment Recommendations  None recommended by OT    Recommendations for Other Services      Precautions / Restrictions Precautions Precautions: Fall Precaution Comments: pt denies h/o falls in past 1 year Restrictions Weight Bearing Restrictions: No       Mobility Bed Mobility Overal bed mobility: Needs Assistance Bed Mobility: Supine to Sit     Supine to sit: Supervision        Transfers Overall transfer level: Needs assistance   Transfers: Sit to/from Stand;Stand Pivot Transfers Sit to Stand: Min guard Stand pivot transfers: Min guard            Balance Overall balance assessment: No apparent balance deficits (not formally assessed)                                         ADL either performed or assessed with clinical judgement   ADL Overall ADL's : Needs assistance/impaired Eating/Feeding: Set up;Sitting   Grooming: Wash/dry face;Standing   Upper Body Bathing: Set up;Sitting   Lower Body Bathing: Minimal assistance;Sit to/from stand;Cueing for sequencing;Cueing for safety   Upper Body Dressing : Supervision/safety;Sitting   Lower Body Dressing: Minimal assistance;Sit to/from stand   Toilet Transfer: Min guard;Ambulation;Comfort height toilet   Toileting- Clothing Manipulation and Hygiene: Minimal assistance;Sit to/from stand       Functional mobility during  ADLs: Min guard       Vision Patient Visual Report: No change from baseline            Cognition Arousal/Alertness: Awake/alert Behavior During Therapy: WFL for tasks assessed/performed Overall Cognitive Status: Within Functional Limits for tasks assessed                                                     Pertinent Vitals/ Pain       Pain Assessment: No/denies pain     Prior Functioning/Environment              Frequency  Min 2X/week        Progress Toward Goals  OT Goals(current goals can now be found in the care plan section)  Progress towards OT goals: Progressing toward goals  Acute Rehab OT Goals Patient Stated Goal: do yardwork OT Goal Formulation: With patient Time For Goal Achievement: 12/17/17  Plan Discharge plan remains appropriate       AM-PAC PT "6 Clicks" Daily Activity     Outcome Measure   Help from another person eating meals?: None Help from another person taking care of personal grooming?: A Little Help from another person toileting, which includes using toliet, bedpan, or urinal?: A Little Help from another person bathing (including washing, rinsing, drying)?: A Little Help from  another person to put on and taking off regular upper body clothing?: A Little Help from another person to put on and taking off regular lower body clothing?: A Little 6 Click Score: 19    End of Session    OT Visit Diagnosis: Unsteadiness on feet (R26.81);History of falling (Z91.81);Other abnormalities of gait and mobility (R26.89)   Activity Tolerance Patient tolerated treatment well   Patient Left in chair;with call bell/phone within reach;with chair alarm set   Nurse Communication Mobility status        Time: 0211-1552 OT Time Calculation (min): 19 min  Charges: OT General Charges $OT Visit: 1 Visit OT Treatments $Self Care/Home Management : 8-22 mins  [place], [place]   Betsy Pries 12/03/2017,  8:42 PM

## 2017-12-03 NOTE — Progress Notes (Signed)
PROGRESS NOTE [facility]   [name]   MVE:[id number] DOB: Jun 03, 1923  DOA: 12/01/2017 PCP: [name], MD   Brief Narrative:  84 y.o.   Afib/tachybradycardia syndrome pacemaker VVI on Xarelto,  HTN and anemia Type 2 diabetes mellitus, Multiple episodes of small bowel obstruction  PAD status post left popliteal artery intervention 08/06/2016,  chronic kidney disease stage II-III,  prior prostate cancer,  stroke 2017 when he came off of his Xarelto  presented to the emergency department progressively worsening weakness for about a month or so was advised by primary care physician on 7/9 to come to ED and declined to do the same , he is also complaining of nausea and having diarrhea, associated symptoms include poor appetite and dizziness upon standing.   dyspnea on exertion, and sometimes intermittent abdominal cramping.  Patient denies any weight loss, bleeding or falls.  Subjective:  Not dizzy on standing however blood pressures this morning in the 28Z systolic Repeat shows 662 systolic Ambulatory to some extent Tolerating some diet No chest pain Granddaughter at bedside with questions  Assessment & Plan: AKI on CKD stage III Felt to be secondary to dehydration from poor oral intake and continuation of Hyzaar. Baseline Cr 1.44, Cr trending down, however still hyperkalemic, will cont. IVF, avoid nephrotoxic agents and hypotension.   Mild Hyperkalemia  Treated with one dose of lasix and IVF, no improvement, will give Kayexalate x 1  Check BMP and Mag in AM    Periodically has been on Lasix over the past year  Afib chads score >6 HR well controlled, adjusting metoprolol from 50 XL to 25 and continue Xarelto We will walk him today and see if his heart rate elevates-I will CC his primary cardiologist Dr. Lovena Le who has been adjusting his meds over the past year  Stroke 2017 when off Xarelto Continue Xarelto as benefit outweighs risk in terms of  stroke  HTN  BP soft, holding Hyzaar due to AKI-adjusted metoprolol 7/17 Orthostatics negative   Metabolic acidosis likely secondary to AKI- Start bicarb 650 twice daily recheck labs a.m.-not ready for discharge In addition magnesium is 1.5 give 2 g of IV mag  General deconditioning due to poor oral intake/Moderate malnutrition  Continue Remeron to help with appetite. Continue protein supp. PT and OT no follow up. Baseline live alone and ambulatory  DVT prophylaxis: Heparin Sq Code Status:  Full Code Family Communication: Discussion with granddaughter at the bedside Disposition Plan: Home 12/04/2017 if multiple parameters are met as per notes above  Consultants:   None   Procedures:   None   Antimicrobials:  None    Objective: Vitals:   12/03/17 0546 12/03/17 0615 12/03/17 0617 12/03/17 0619  BP: (!) 73/38 (!) 76/36 (!) 88/53 (!) 85/57  Pulse: 63 71 73 79  Resp: 15     Temp: 99.6 F (37.6 C)     TempSrc: Oral     SpO2:        Intake/Output Summary (Last 24 hours) at 12/03/2017 1053 Last data filed at 12/03/2017 1021 Gross per 24 hour  Intake 2720 ml  Output 450 ml  Net 2270 ml   There were no vitals filed for this visit.  Examination:  Does not very hard of hearing no distress No chest pain EOMI NCAT moderate dentition looks younger than stated age S1-S2 no murmur rub or gallop Abdomen has hernias in supraumbilical and infraumbilical region No lower extremity edema Mild Rales posterolaterally no JVD  Data Reviewed:  I have personally reviewed following labs and imaging studies  CBC: Recent Labs  Lab 12/01/17 1306 12/02/17 0458  WBC 7.8 7.8  HGB 10.7* 10.8*  HCT 32.1* 31.5*  MCV 78.3 77.2*  PLT 192 761   Basic Metabolic Panel: Recent Labs  Lab 12/01/17 1306 12/02/17 0458 12/03/17 0438  NA 142 139 143  K 5.2* 5.4* 4.2  CL 117* 117* 121*  CO2 18* 15* 15*  GLUCOSE 79 93 95  BUN 35* 29* 27*  CREATININE 2.03* 1.74* 1.79*  CALCIUM 9.0  8.1* 8.0*  MG  --  1.9 1.5*   GFR: CrCl cannot be calculated (Unknown ideal weight.). Liver Function Tests: Recent Labs  Lab 12/01/17 1306  AST 19  ALT 11  ALKPHOS 62  BILITOT 0.6  PROT 6.8  ALBUMIN 3.7   Recent Labs  Lab 12/01/17 1306  LIPASE 55*   No results for input(s): AMMONIA in the last 168 hours. Coagulation Profile: No results for input(s): INR, PROTIME in the last 168 hours. Cardiac Enzymes: No results for input(s): CKTOTAL, CKMB, CKMBINDEX, TROPONINI in the last 168 hours. BNP (last 3 results) No results for input(s): PROBNP in the last 8760 hours. HbA1C: No results for input(s): HGBA1C in the last 72 hours. CBG: No results for input(s): GLUCAP in the last 168 hours. Lipid Profile: No results for input(s): CHOL, HDL, LDLCALC, TRIG, CHOLHDL, LDLDIRECT in the last 72 hours. Thyroid Function Tests: No results for input(s): TSH, T4TOTAL, FREET4, T3FREE, THYROIDAB in the last 72 hours. Anemia Panel: No results for input(s): VITAMINB12, FOLATE, FERRITIN, TIBC, IRON, RETICCTPCT in the last 72 hours. Sepsis Labs: Recent Labs  Lab 12/01/17 1552  LATICACIDVEN 1.35    No results found for this or any previous visit (from the past 240 hour(s)).    Radiology Studies: Ct Abdomen Pelvis Wo Contrast  Result Date: 12/01/2017 CLINICAL DATA:  Dehydration, lack of appetite and indigestion. EXAM: CT ABDOMEN AND PELVIS WITHOUT CONTRAST TECHNIQUE: Multidetector CT imaging of the abdomen and pelvis was performed following the standard protocol without IV contrast. COMPARISON:  CT of the abdomen and pelvis with contrast on 01/12/2017 FINDINGS: Lower chest: No acute abnormality. Hepatobiliary: No focal liver abnormality is seen. Status post cholecystectomy. No biliary dilatation. Pancreas: Atrophic pancreas. No evidence of pancreatic inflammation, mass or ductal dilatation. Spleen: Normal in size without focal abnormality. Adrenals/Urinary Tract: No adrenal masses. The kidneys  appear atrophic, especially the right. No hydronephrosis. The bladder appears unremarkable. Stomach/Bowel: No evidence of bowel obstruction or significant ileus. Stable moderate-sized hiatal hernia. Stable right-sided ventral hernia defect containing a segment of the transverse colon without signs of incarceration. Stable second smaller right ventral hernia defect just to the right of the umbilicus containing a segment of small bowel without signs of incarceration. Anastomotic suture line again evident in the right lower quadrant related to prior small bowel resection. No free air. Vascular/Lymphatic: No significant vascular findings are present. No enlarged abdominal or pelvic lymph nodes. Reproductive: Prostate is unremarkable. Other: No ascites or focal abscess identified. Musculoskeletal: No acute or significant osseous findings. IMPRESSION: No significant acute findings. Stable ventral hernias of the right abdominal wall containing a segment of the transverse colon and small bowel. Bowel at the level of both hernias demonstrate no evidence of incarceration. Electronically Signed   By: Aletta Edouard M.D.   On: 12/01/2017 16:21    Scheduled Meds: . feeding supplement  1 Container Oral TID BM  . metoprolol succinate  50 mg Oral QPM  .  mirtazapine  7.5 mg Oral QHS  . pantoprazole  40 mg Oral Daily  . Rivaroxaban  15 mg Oral Q supper  . sodium bicarbonate  650 mg Oral BID   Continuous Infusions: . sodium chloride 75 mL/hr at 12/03/17 0838     LOS: 0 days    Verneita Griffes, MD [facility] (P) [phone]

## 2017-12-04 LAB — BASIC METABOLIC PANEL
Anion gap: 6 (ref 5–15)
BUN: 23 mg/dL (ref 8–23)
CALCIUM: 8.1 mg/dL — AB (ref 8.9–10.3)
CO2: 14 mmol/L — AB (ref 22–32)
CREATININE: 1.7 mg/dL — AB (ref 0.61–1.24)
Chloride: 123 mmol/L — ABNORMAL HIGH (ref 98–111)
GFR calc non Af Amer: 33 mL/min — ABNORMAL LOW (ref >60)
GFR, EST AFRICAN AMERICAN: 39 mL/min — AB (ref >60)
Glucose, Bld: 168 mg/dL — ABNORMAL HIGH (ref 70–99)
Potassium: 5.1 mmol/L (ref 3.5–5.1)
Sodium: 143 mmol/L (ref 135–145)

## 2017-12-04 LAB — MAGNESIUM: Magnesium: 2 mg/dL (ref 1.7–2.4)

## 2017-12-04 MED ORDER — SODIUM BICARBONATE 650 MG PO TABS
650.0000 mg | ORAL_TABLET | Freq: Three times a day (TID) | ORAL | Status: DC
  Administered 2017-12-04 – 2017-12-05 (×3): 650 mg via ORAL
  Filled 2017-12-04 (×3): qty 1

## 2017-12-04 MED ORDER — LACTATED RINGERS IV SOLN
INTRAVENOUS | Status: DC
  Administered 2017-12-04 – 2017-12-05 (×2): via INTRAVENOUS

## 2017-12-04 NOTE — Progress Notes (Signed)
PROGRESS NOTE [facility]   [name]   UUE:[id number] DOB: 06-02-1923  DOA: 12/01/2017 PCP: [name], MD   Brief Narrative:  84 y.o.   Afib/tachybradycardia syndrome pacemaker VVI on Xarelto,  HTN and anemia Type 2 diabetes mellitus, Multiple episodes of small bowel obstruction  PAD status post left popliteal artery intervention 08/06/2016,  chronic kidney disease stage II-III,  prior prostate cancer,  stroke 2017 when he came off of his Xarelto  admit 7.15 admit weakness for about a month or so was advised by primary care physician on 7/9 to come to ED and declined to do the same he is also complaining of nausea and having diarrhea, associated symptoms include poor appetite and dizziness upon standing.   dyspnea on exertion, and sometimes intermittent abdominal cramping.  Patient denies any weight loss, bleeding or falls.  Subjective:  Awake alert has ambulated a couple of times seems to want to go home and is disappointed that his labs are still not cleared  Assessment & Plan: AKI on CKD stage III Felt to be secondary to dehydration from poor oral intake and continuation of Hyzaar.  Mild Hyperkalemia  Treated with one dose of lasix and IVF, no improvement, will give Kayexalate x 1  Magnesium corrected to 2  Afib chads score >6 HR well controlled, adjusting metoprolol from 50 XL to 25 and continue Xarelto We will walk him today and see if his heart rate elevates-I will CC his primary cardiologist Dr. Lovena Le who has been adjusting his meds over the past year  Stroke 2017 when off [place] as benefit outweighs risk in terms of stroke  HTN  BP soft, holding Hyzaar due to AKI-adjusted metoprolol 7/17 Orthostatics negative   Hyperchloremic non-gap acidosis likely secondary to diarrhea in a setting of concomitant volume depletion bicarb is still low in the 14 range and he has chloride of 123 pointing to Change IV fluid from  saline to lactated Ringer Increase bicarb to 650 3 times daily Encourage copious fluid intake 1.8 L at least Expect this will resolve with increased fluid intake and pushing bicarb up-at this juncture does not need any potassium binder unless K is above 5.5 EKG was done on 7/17 and does not show any peak T waves He should not be discharged until at least the bicarb is above 15 or 16 and trending upwards and I will repeat labs in the morning-I have discussed the patient's care personally with Dr. Joelyn Oms of nephrology who agrees with this plan  General deconditioning due to poor oral intake/Moderate malnutrition  Continue Remeron to help with appetite. Continue protein supp. PT and OT no follow up. Baseline live alone and ambulatory  DVT prophylaxis: Heparin Sq Code Status:  Full Code Family Communication: Discussion with granddaughter at the bedside Disposition Plan: Home 12/04/2017 if multiple parameters are met as per notes above  Consultants:   None   Procedures:   None   Antimicrobials:  None    Objective: Vitals:   12/03/17 1530 12/03/17 2145 12/04/17 0042 12/04/17 0541  BP: (!) 86/72 (!) 106/55  91/62  Pulse: 63 60  62  Resp: '14 14  14  '$ Temp: 98.4 F (36.9 C) 98.4 F (36.9 C)  98.8 F (37.1 C)  TempSrc:  Oral  Oral  SpO2: 100% 100%  100%  Height:   5' 10.98" (1.803 m)     Intake/Output Summary (Last 24 hours) at 12/04/2017 1046 Last data filed at 12/04/2017 9150 Gross per  24 hour  Intake 1700.83 ml  Output 300 ml  Net 1400.83 ml   There were no vitals filed for this visit.  Examination:  Alert oriented no distress EOMI NCAT S1-S2 no murmur rub or gallop Abdomen soft nontender nondistended no rebound no guarding Chest is clinically clear Neurologically intact Skin soft supple with no lower extremity edema  Data Reviewed: I have personally reviewed following labs and imaging studies  CBC: Recent Labs  Lab 12/01/17 1306 12/02/17 0458  WBC 7.8 7.8    HGB 10.7* 10.8*  HCT 32.1* 31.5*  MCV 78.3 77.2*  PLT 192 193   Basic Metabolic Panel: Recent Labs  Lab 12/01/17 1306 12/02/17 0458 12/03/17 0438 12/04/17 0955  NA 142 139 143 143  K 5.2* 5.4* 4.2 5.1  CL 117* 117* 121* 123*  CO2 18* 15* 15* 14*  GLUCOSE 79 93 95 168*  BUN 35* 29* 27* 23  CREATININE 2.03* 1.74* 1.79* 1.70*  CALCIUM 9.0 8.1* 8.0* 8.1*  MG  --  1.9 1.5* 2.0   GFR: CrCl cannot be calculated (Unknown ideal weight.). Liver Function Tests: Recent Labs  Lab 12/01/17 1306  AST 19  ALT 11  ALKPHOS 62  BILITOT 0.6  PROT 6.8  ALBUMIN 3.7   Recent Labs  Lab 12/01/17 1306  LIPASE 55*   No results for input(s): AMMONIA in the last 168 hours. Coagulation Profile: No results for input(s): INR, PROTIME in the last 168 hours. Cardiac Enzymes: No results for input(s): CKTOTAL, CKMB, CKMBINDEX, TROPONINI in the last 168 hours. BNP (last 3 results) No results for input(s): PROBNP in the last 8760 hours. HbA1C: No results for input(s): HGBA1C in the last 72 hours. CBG: No results for input(s): GLUCAP in the last 168 hours. Lipid Profile: No results for input(s): CHOL, HDL, LDLCALC, TRIG, CHOLHDL, LDLDIRECT in the last 72 hours. Thyroid Function Tests: No results for input(s): TSH, T4TOTAL, FREET4, T3FREE, THYROIDAB in the last 72 hours. Anemia Panel: No results for input(s): VITAMINB12, FOLATE, FERRITIN, TIBC, IRON, RETICCTPCT in the last 72 hours. Sepsis Labs: Recent Labs  Lab 12/01/17 1552  LATICACIDVEN 1.35    No results found for this or any previous visit (from the past 240 hour(s)).    Radiology Studies: No results found.  Scheduled Meds: . docusate sodium  200 mg Oral QHS  . feeding supplement  1 Container Oral TID BM  . metoprolol succinate  25 mg Oral QPM  . mirtazapine  7.5 mg Oral QHS  . pantoprazole  40 mg Oral Daily  . Rivaroxaban  15 mg Oral Q supper  . sodium bicarbonate  650 mg Oral TID   Continuous Infusions: . sodium  chloride 75 mL/hr at 12/03/17 0838  . lactated ringers       LOS: 1 day    Verneita Griffes, MD [facility] [phone]

## 2017-12-05 LAB — MAGNESIUM: MAGNESIUM: 1.9 mg/dL (ref 1.7–2.4)

## 2017-12-05 LAB — COMPREHENSIVE METABOLIC PANEL
ALBUMIN: 3.3 g/dL — AB (ref 3.5–5.0)
ALK PHOS: 57 U/L (ref 38–126)
ALT: 13 U/L (ref 0–44)
ANION GAP: 6 (ref 5–15)
AST: 20 U/L (ref 15–41)
BUN: 19 mg/dL (ref 8–23)
CALCIUM: 8.6 mg/dL — AB (ref 8.9–10.3)
CO2: 18 mmol/L — ABNORMAL LOW (ref 22–32)
Chloride: 117 mmol/L — ABNORMAL HIGH (ref 98–111)
Creatinine, Ser: 1.69 mg/dL — ABNORMAL HIGH (ref 0.61–1.24)
GFR calc Af Amer: 39 mL/min — ABNORMAL LOW (ref >60)
GFR, EST NON AFRICAN AMERICAN: 34 mL/min — AB (ref >60)
GLUCOSE: 102 mg/dL — AB (ref 70–99)
POTASSIUM: 5.1 mmol/L (ref 3.5–5.1)
Sodium: 141 mmol/L (ref 135–145)
TOTAL PROTEIN: 6.3 g/dL — AB (ref 6.5–8.1)
Total Bilirubin: 0.6 mg/dL (ref 0.3–1.2)

## 2017-12-05 LAB — PHOSPHORUS: Phosphorus: 2.3 mg/dL — ABNORMAL LOW (ref 2.5–4.6)

## 2017-12-05 MED ORDER — MIRTAZAPINE 7.5 MG PO TABS: 7.5000 mg | ORAL_TABLET | Freq: Every day | ORAL | 0 refills | Status: DC

## 2017-12-05 MED ORDER — METOPROLOL SUCCINATE ER 25 MG PO TB24: 25.0000 mg | ORAL_TABLET | Freq: Every evening | ORAL | 0 refills | Status: DC

## 2017-12-05 MED ORDER — SODIUM BICARBONATE 650 MG PO TABS: 1300.0000 mg | ORAL_TABLET | Freq: Two times a day (BID) | ORAL | 0 refills | Status: DC

## 2017-12-05 NOTE — Discharge Planning (Signed)
Patient IV removed.  RN assessment and VS revealed stability for DC to home with [facility]. Discharge papers given, explained and educated. Patient and family understand importance in continual hydration.  Informed of suggested FU appt and appt made.  Scripts e-scribed to [place])  Once ready, will be wheeled to front and family transporting home via car.

## 2017-12-05 NOTE — Progress Notes (Signed)
Occupational Therapy Treatment Patient Details Name: [name] MRN: [id number] DOB: 1923-12-25 Today's Date: 12/05/2017    History of present illness 84 y.o. male with medical history significant of Afib on Xarelto, HTN and anemia presented to the emergency department progressively worsening weakness for about a month or so, he is also complaining of nausea and having diarrhea, associated symptoms include poor appetite and dizziness upon standing.    OT comments  Worked on dynamic balance retrieving items for adls and ambulated in hall at his request, with min guard assist for safety  Follow Up Recommendations  No OT follow up;Supervision/Assistance - 24 hour    Equipment Recommendations  None recommended by OT    Recommendations for Other Services      Precautions / Restrictions Precautions Precautions: Fall Precaution Comments: pt denies h/o falls in past 1 year Restrictions Weight Bearing Restrictions: No       Mobility Bed Mobility         Supine to sit: Modified independent (Device/Increase time)        Transfers       Sit to Stand: Modified independent (Device/Increase time) Stand pivot transfers: Min guard       General transfer comment: no LOB but was a little unsteady    Balance                                           ADL either performed or assessed with clinical judgement   ADL                           Toilet Transfer: Min guard;Ambulation;Comfort height toilet             General ADL Comments: pt needed min guard assist to retrieve clothing in room.  Able to don clothes without supervision. Did not use any AD     Vision       Perception     Praxis      Cognition Arousal/Alertness: Awake/alert Behavior During Therapy: WFL for tasks assessed/performed Overall Cognitive Status: Within Functional Limits for tasks assessed                                          Exercises      Shoulder Instructions       General Comments      Pertinent Vitals/ Pain       Pain Assessment: No/denies pain  Home Living                                          Prior Functioning/Environment              Frequency           Progress Toward Goals  OT Goals(current goals can now be found in the care plan section)  Progress towards OT goals: Progressing toward goals     Plan      Co-evaluation                 AM-PAC PT "6 Clicks" Daily Activity     Outcome Measure   Help from another person eating meals?:  None Help from another person taking care of personal grooming?: A Little Help from another person toileting, which includes using toliet, bedpan, or urinal?: A Little Help from another person bathing (including washing, rinsing, drying)?: A Little Help from another person to put on and taking off regular upper body clothing?: A Little Help from another person to put on and taking off regular lower body clothing?: A Little 6 Click Score: 19    End of Session    OT Visit Diagnosis: Unsteadiness on feet (R26.81);History of falling (Z91.81);Other abnormalities of gait and mobility (R26.89)   Activity Tolerance Patient tolerated treatment well   Patient Left in chair;with call bell/phone within reach;with chair alarm set   Nurse Communication          Time: [phone] OT Time Calculation (min): 20 min  Charges: OT General Charges $OT Visit: 1 Visit OT Treatments $Self Care/Home Management : 8-22 mins  Lesle Chris, OTR/L [phone] 12/05/2017   Stephone Gum 12/05/2017, 12:34 PM

## 2017-12-05 NOTE — Discharge Summary (Signed)
Physician Discharge Summary  [name] YOV:[id number] DOB: 09-27-1923 DOA: 12/01/2017  PCP: [name], MD  Admit date: 12/01/2017 Discharge date: 12/05/2017  Time spent: 35 minutes  Recommendations for Outpatient Follow-up:  1. New medications of sodium bicarb twice daily in addition to trazodone at night 2. Please ensure drinking at least 2 L of water daily and have encouraged patient to cut down on salt intake as probably was culprit and causing hyperchloremic acidosis 3. Please ensure home health follows with the patient and patient gets at least weekly labs-would not treat hyperkalemia unless above 5.5 and or significant arrhythmia and may need an EKG at office visit and follows with PCP 4. Suggest outpatient nephrology follow-up if there are further concerns 5. Completely discontinue losartan HCTZ-note dosage adjustment and as well of metoprolol 50 XL-->metoprolol 25 XL daily 6. Just outpatient careful coordination with cardiologist Dr. Lovena Le who will be CCed on this note as he has been adjusting cardiac meds inclusive of diuretics and occasional Lasix  Discharge Diagnoses:  Active Problems:   AKI (acute kidney injury) ([place])   Syncope   Dehydration   Discharge Condition: Improved  Diet recommendation: Low-salt high fluid diet at least 2 L  Filed Weights   12/05/17 1122  Weight: 69.4 kg (153 lb)    History of present illness:  84 y.o. Afib/tachybradycardia syndrome pacemaker VVI on Xarelto,  HTN and anemia Type 2 diabetes mellitus, Multiple episodes of small bowel obstruction  PAD status post left popliteal artery intervention 08/06/2016,  chronic kidney disease stage II-III,  prior prostate cancer,  stroke 2017 when he came off of his Xarelto  admit 7.15 admit weakness for about a month or so was advised by primary care physician on 7/9 to come to ED and declined to do the same he is also complaining of nausea and having diarrhea, associated  symptoms include poor appetite and dizziness upon standing.   dyspnea on exertion, and sometimes intermittent abdominal cramping. Patient denies any weight loss, bleeding or falls.    Hospital Course:  AKI on CKD stage III Felt to be secondary to dehydration from poor oral intakeand continuation of Hyzaar.   Afib chads score >6 HR well controlled, adjusting metoprolol from 50 XL to 25 and continue Xarelto We will walk him today and see if his heart rate elevates-I will CC his primary cardiologist Dr. Lovena Le who has been adjusting his meds over the past year  Stroke 2017 when off [place] as benefit outweighs risk in terms of stroke  HTN  BP soft, holding Hyzaar due to AKI-adjusted metoprolol 7/17 Orthostatics negative   Hyperchloremic non-gap acidosis likely secondary to diarrhea in a setting of concomitant volume depletion Initial presentation with mild hyperkalemia without EKG changes and treated with Kayexalate bicarb is still low in the 14 range and he has chloride of 123 pointing to Change IV fluid from saline to lactated Ringer Increase bicarb to 650 3 times daily Encourage copious fluid intake 1.8 L at least Expect this will resolve with increased fluid intake and pushing bicarb up-at this juncture does not need any potassium binder unless K is above 5.5 EKG was done on 7/17 and does not show any peak T waves Discharge bicarbonate was 19-volume depletion was still present however his sodium was better at 141 down from 143 and his chloride had dropped from 1 23-1 17  I had a long discussion with his granddaughter who lives with him on and off and has a room  at his home-I recommended to her another day of lactated Ringer but she stated that the patient will probably not wish to do so as such I have recommended at least 2 L of fluids in addition to oral bicarb and labs on Tuesday of next week 7/23 at the latest-we will ask home health RN to come out and  coordinate lab work if he cannot make it to his PCPs office-she understands risks and warnings but I feel patient is reasonably stabilized although I would have liked him to stay 1 more day  General deconditioning due to poor oral intake/Moderate malnutrition  Continue Remeron to help with appetite. Continue protein supp. will ask home health nurse to come out he does not need therapy evaluations   Procedures:  None   Consultations:  Telephone consulted Dr. Joelyn Oms  Discharge Exam: Vitals:   12/04/17 2103 12/05/17 0611  BP: (!) 120/50 (!) 106/58  Pulse: 64 70  Resp: 18 18  Temp: 98.2 F (36.8 C) 98.8 F (37.1 C)  SpO2: 99% 99%    General: EOMI NCAT in no distress Cardiovascular: S1-S2 no murmur rub or gallop Respiratory: Clear no added sound No lower extremity edema, no rales no rhonchi, no skin rash  Discharge Instructions   Discharge Instructions    Diet - low sodium heart healthy   Complete by:  As directed    Discharge instructions   Complete by:  As directed    Needs labs in about 3-4 days Discontinue completely the htan--maybe consider a different agent  We will get home health RN to come out   Increase activity slowly   Complete by:  As directed      Allergies as of 12/05/2017   No Known Allergies     Medication List    STOP taking these medications   loratadine 10 MG tablet Commonly known as:  CLARITIN   losartan-hydrochlorothiazide 100-12.5 MG tablet Commonly known as:  HYZAAR   megestrol 400 MG/10ML suspension Commonly known as:  MEGACE     TAKE these medications   ferrous sulfate 325 (65 FE) MG tablet Take 1 tablet (325 mg total) by mouth daily with breakfast.   metoprolol succinate 25 MG 24 hr tablet Commonly known as:  TOPROL-XL Take 1 tablet (25 mg total) by mouth every evening. What changed:    medication strength  how much to take   mirtazapine 7.5 MG tablet Commonly known as:  REMERON Take 1 tablet (7.5 mg total) by mouth  at bedtime.   pantoprazole 40 MG tablet Commonly known as:  PROTONIX Take 1 tablet (40 mg total) by mouth daily.   Rivaroxaban 15 MG Tabs tablet Commonly known as:  XARELTO Take 1 tablet (15 mg total) by mouth daily with supper.   sodium bicarbonate 650 MG tablet Take 2 tablets (1,300 mg total) by mouth 2 (two) times daily.      No Known Allergies    The results of significant diagnostics from this hospitalization (including imaging, microbiology, ancillary and laboratory) are listed below for reference.    Significant Diagnostic Studies: Ct Abdomen Pelvis Wo Contrast  Result Date: 12/01/2017 CLINICAL DATA:  Dehydration, lack of appetite and indigestion. EXAM: CT ABDOMEN AND PELVIS WITHOUT CONTRAST TECHNIQUE: Multidetector CT imaging of the abdomen and pelvis was performed following the standard protocol without IV contrast. COMPARISON:  CT of the abdomen and pelvis with contrast on 01/12/2017 FINDINGS: Lower chest: No acute abnormality. Hepatobiliary: No focal liver abnormality is seen. Status post cholecystectomy. No  biliary dilatation. Pancreas: Atrophic pancreas. No evidence of pancreatic inflammation, mass or ductal dilatation. Spleen: Normal in size without focal abnormality. Adrenals/Urinary Tract: No adrenal masses. The kidneys appear atrophic, especially the right. No hydronephrosis. The bladder appears unremarkable. Stomach/Bowel: No evidence of bowel obstruction or significant ileus. Stable moderate-sized hiatal hernia. Stable right-sided ventral hernia defect containing a segment of the transverse colon without signs of incarceration. Stable second smaller right ventral hernia defect just to the right of the umbilicus containing a segment of small bowel without signs of incarceration. Anastomotic suture line again evident in the right lower quadrant related to prior small bowel resection. No free air. Vascular/Lymphatic: No significant vascular findings are present. No enlarged  abdominal or pelvic lymph nodes. Reproductive: Prostate is unremarkable. Other: No ascites or focal abscess identified. Musculoskeletal: No acute or significant osseous findings. IMPRESSION: No significant acute findings. Stable ventral hernias of the right abdominal wall containing a segment of the transverse colon and small bowel. Bowel at the level of both hernias demonstrate no evidence of incarceration. Electronically Signed   By: Aletta Edouard M.D.   On: 12/01/2017 16:21    Microbiology: No results found for this or any previous visit (from the past 240 hour(s)).   Labs: Basic Metabolic Panel: Recent Labs  Lab 12/01/17 1306 12/02/17 0458 12/03/17 0438 12/04/17 0955 12/05/17 0752  NA 142 139 143 143 141  K 5.2* 5.4* 4.2 5.1 5.1  CL 117* 117* 121* 123* 117*  CO2 18* 15* 15* 14* 18*  GLUCOSE 79 93 95 168* 102*  BUN 35* 29* 27* 23 19  CREATININE 2.03* 1.74* 1.79* 1.70* 1.69*  CALCIUM 9.0 8.1* 8.0* 8.1* 8.6*  MG  --  1.9 1.5* 2.0 1.9  PHOS  --   --   --   --  2.3*   Liver Function Tests: Recent Labs  Lab 12/01/17 1306 12/05/17 0752  AST 19 20  ALT 11 13  ALKPHOS 62 57  BILITOT 0.6 0.6  PROT 6.8 6.3*  ALBUMIN 3.7 3.3*   Recent Labs  Lab 12/01/17 1306  LIPASE 55*   No results for input(s): AMMONIA in the last 168 hours. CBC: Recent Labs  Lab 12/01/17 1306 12/02/17 0458  WBC 7.8 7.8  HGB 10.7* 10.8*  HCT 32.1* 31.5*  MCV 78.3 77.2*  PLT 192 159   Cardiac Enzymes: No results for input(s): CKTOTAL, CKMB, CKMBINDEX, TROPONINI in the last 168 hours. BNP: BNP (last 3 results) No results for input(s): BNP in the last 8760 hours.  ProBNP (last 3 results) No results for input(s): PROBNP in the last 8760 hours.  CBG: No results for input(s): GLUCAP in the last 168 hours.     Signed:  Nita Sells MD   [facility] 12/05/2017, 11:23 AM

## 2017-12-05 NOTE — Progress Notes (Signed)
Discharge planning, spoke with patient and granddaughter at bedside. Have chosen [facility] for [facility] RN, check meds and draw labs. Contacted [facility] for referral, they accepted. Has a RW at home. [phone]

## 2017-12-08 ENCOUNTER — Telehealth: Payer: Self-pay | Admitting: Behavioral Health

## 2017-12-08 NOTE — Telephone Encounter (Signed)
Transition Care Management Follow-up Telephone Call   PCP: [name], MD  Admit date: 12/01/2017 Discharge date: 12/05/2017     How have you been since you were released from the hospital? Per the patient's granddaughter (Mrs. Ronnald Ramp), "he's doing much better, trying to increase his water consumption".   Do you understand why you were in the hospital? yes, "dehydration".   Do you understand the discharge instructions? yes   Where were you discharged to? Home with granddaughter at this time.   Items Reviewed:  Medications reviewed: yes  Allergies reviewed: yes, NKA  Dietary changes reviewed: yes, regular diet  Referrals reviewed: yes, Please ensure home health follows with the patient and patient gets at least weekly labs-would not treat hyperkalemia unless above 5.5 and or significant arrhythmia and may need an EKG at office visit and follows with PCP; Suggest outpatient nephrology follow-up if there are further concerns; Just outpatient careful coordination with cardiologist Dr. Lovena Le who will be CCed on this note as he has been adjusting cardiac meds inclusive of diuretics and occasional Lasix.  Functional Questionnaire:   Activities of Daily Living (ADLs):   He states they are independent in the following: ambulation, bathing and hygiene, feeding, continence, grooming, toileting and dressing States they require assistance with the following: None   Any transportation issues/concerns?: no   Any patient concerns? yes, per the patient's granddaughter, she would like to request a rolling walker to assist with gait.    Confirmed importance and date/time of follow-up visits scheduled yes, 12/08/17 at 11:30 AM.  Provider Appointment booked with Dr. Ethelene Hal.  Confirmed with patient if condition begins to worsen call PCP or go to the ER.  Patient was given the office number and encouraged to call back with question or concerns.  : yes

## 2017-12-10 ENCOUNTER — Encounter: Payer: Self-pay | Admitting: Family Medicine

## 2017-12-10 ENCOUNTER — Other Ambulatory Visit: Payer: Self-pay | Admitting: Family Medicine

## 2017-12-10 ENCOUNTER — Ambulatory Visit: Payer: Medicare Other | Admitting: Family Medicine

## 2017-12-10 VITALS — BP 110/70 | HR 78 | Ht 70.98 in | Wt 154.1 lb

## 2017-12-10 DIAGNOSIS — E612 Magnesium deficiency: Secondary | ICD-10-CM | POA: Diagnosis not present

## 2017-12-10 DIAGNOSIS — E860 Dehydration: Secondary | ICD-10-CM | POA: Diagnosis not present

## 2017-12-10 LAB — BASIC METABOLIC PANEL
BUN: 18 mg/dL (ref 6–23)
CO2: 19 mEq/L (ref 19–32)
Calcium: 8.3 mg/dL — ABNORMAL LOW (ref 8.4–10.5)
Chloride: 110 mEq/L (ref 96–112)
Creatinine, Ser: 1.42 mg/dL (ref 0.40–1.50)
GFR: 60 mL/min — AB (ref >60.00)
GLUCOSE: 96 mg/dL (ref 70–99)
POTASSIUM: 4.2 meq/L (ref 3.5–5.1)
Sodium: 138 mEq/L (ref 135–145)

## 2017-12-10 LAB — MAGNESIUM: Magnesium: 1.7 mg/dL (ref 1.5–2.5)

## 2017-12-10 NOTE — Progress Notes (Signed)
Subjective:  Patient ID: [name], male    DOB: Feb 18, 1924  Age: 84 y.o. MRN: [id number]  CC: Hospitalization Follow-up   HPI [name] presents for follow-up of his recent hospitalization for dehydration and hypotension.  Patient responded to fluid resuscitation.  He is accompanied by his granddaughter today.  Hyzaar was discontinued and he remains normotensive.  Granddaughter does not feel he has been successful in rehydrating with water.  He asks if he can continue coffee and sodas.  Advised him to hold his coffee intake to 1 cup/day.  He will drink 1 can of soda daily.  He will rehab hydrate with water as much as possible for the remainder of the day.  Outpatient Medications Prior to Visit  Medication Sig Dispense Refill  . ferrous sulfate 325 (65 FE) MG tablet Take 1 tablet (325 mg total) by mouth daily with breakfast. 90 tablet 1  . metoprolol succinate (TOPROL-XL) 25 MG 24 hr tablet Take 1 tablet (25 mg total) by mouth every evening. 30 tablet 0  . mirtazapine (REMERON) 7.5 MG tablet Take 1 tablet (7.5 mg total) by mouth at bedtime. 30 tablet 0  . pantoprazole (PROTONIX) 40 MG tablet Take 1 tablet (40 mg total) by mouth daily. 30 tablet 11  . Rivaroxaban (XARELTO) 15 MG TABS tablet Take 1 tablet (15 mg total) by mouth daily with supper. 30 tablet 3  . sodium bicarbonate 650 MG tablet Take 2 tablets (1,300 mg total) by mouth 2 (two) times daily. 60 tablet 0   No facility-administered medications prior to visit.     ROS Review of Systems  Constitutional: Negative for chills, fatigue, fever and unexpected weight change.  Respiratory: Negative for chest tightness and shortness of breath.   Cardiovascular: Negative for chest pain and palpitations.  Gastrointestinal: Negative.   Endocrine: Negative for polyphagia and polyuria.  Genitourinary: Negative for decreased urine volume, difficulty urinating and frequency.  Musculoskeletal: Negative.   Skin: Negative for color  change and pallor.  Allergic/Immunologic: Negative for immunocompromised state.  Neurological: Negative for speech difficulty, light-headedness and headaches.  Hematological: Does not bruise/bleed easily.  Psychiatric/Behavioral: Negative.     Objective:  BP 110/70   Pulse 78   Ht 5' 10.98" (1.803 m)   Wt 154 lb 2 oz (69.9 kg)   SpO2 100%   BMI 21.51 kg/m   BP Readings from Last 3 Encounters:  12/10/17 110/70  12/05/17 (!) 106/58  11/25/17 110/80    Wt Readings from Last 3 Encounters:  12/10/17 154 lb 2 oz (69.9 kg)  12/05/17 153 lb (69.4 kg)  10/16/17 153 lb (69.4 kg)    Physical Exam  Constitutional: He is oriented to person, place, and time. He appears well-developed and well-nourished. No distress.  HENT:  Head: Normocephalic and atraumatic.  Right Ear: External ear normal.  Left Ear: External ear normal.  Eyes: Pupils are equal, round, and reactive to light. Conjunctivae and EOM are normal.  Neck: No JVD present. No tracheal deviation present.  Cardiovascular: Normal rate, regular rhythm and normal heart sounds.  Pulmonary/Chest: Effort normal and breath sounds normal.  Neurological: He is alert and oriented to person, place, and time.  Skin: He is not diaphoretic.       Lab Results  Component Value Date   WBC 7.8 12/02/2017   HGB 10.8 (L) 12/02/2017   HCT 31.5 (L) 12/02/2017   PLT 159 12/02/2017   GLUCOSE 102 (H) 12/05/2017   CHOL 122 07/23/2015  TRIG 55 07/23/2015   HDL 43 07/23/2015   LDLCALC 68 07/23/2015   ALT 13 12/05/2017   AST 20 12/05/2017   NA 141 12/05/2017   K 5.1 12/05/2017   CL 117 (H) 12/05/2017   CREATININE 1.69 (H) 12/05/2017   BUN 19 12/05/2017   CO2 18 (L) 12/05/2017   TSH 0.811 04/07/2010   PSA (H) 12/03/2007    6.13 (NOTE) Test Methodology: [employer] PSA Result repeated and verified.   INR 1.3 (H) 07/30/2016   HGBA1C 6.7 (H) 01/13/2017    Ct Abdomen Pelvis Wo Contrast  Result Date: 12/01/2017 CLINICAL DATA:   Dehydration, lack of appetite and indigestion. EXAM: CT ABDOMEN AND PELVIS WITHOUT CONTRAST TECHNIQUE: Multidetector CT imaging of the abdomen and pelvis was performed following the standard protocol without IV contrast. COMPARISON:  CT of the abdomen and pelvis with contrast on 01/12/2017 FINDINGS: Lower chest: No acute abnormality. Hepatobiliary: No focal liver abnormality is seen. Status post cholecystectomy. No biliary dilatation. Pancreas: Atrophic pancreas. No evidence of pancreatic inflammation, mass or ductal dilatation. Spleen: Normal in size without focal abnormality. Adrenals/Urinary Tract: No adrenal masses. The kidneys appear atrophic, especially the right. No hydronephrosis. The bladder appears unremarkable. Stomach/Bowel: No evidence of bowel obstruction or significant ileus. Stable moderate-sized hiatal hernia. Stable right-sided ventral hernia defect containing a segment of the transverse colon without signs of incarceration. Stable second smaller right ventral hernia defect just to the right of the umbilicus containing a segment of small bowel without signs of incarceration. Anastomotic suture line again evident in the right lower quadrant related to prior small bowel resection. No free air. Vascular/Lymphatic: No significant vascular findings are present. No enlarged abdominal or pelvic lymph nodes. Reproductive: Prostate is unremarkable. Other: No ascites or focal abscess identified. Musculoskeletal: No acute or significant osseous findings. IMPRESSION: No significant acute findings. Stable ventral hernias of the right abdominal wall containing a segment of the transverse colon and small bowel. Bowel at the level of both hernias demonstrate no evidence of incarceration. Electronically Signed   By: Aletta Edouard M.D.   On: 12/01/2017 16:21    Assessment & Plan:   [name] was seen today for hospitalization follow-up.  Diagnoses and all orders for this visit:  Dehydration -     Basic  metabolic panel  Magnesium deficiency -     Magnesium   I am having [name] maintain his pantoprazole, Rivaroxaban, ferrous sulfate, mirtazapine, metoprolol succinate, and sodium bicarbonate.  No orders of the defined types were placed in this encounter.  Is the importance of maintaining hydration.  He will limit coffee and soda intake.  Follow-up in 2 weeks.  Follow-up: Return in about 2 weeks (around 12/24/2017), or hydrate with water![name], MD

## 2017-12-10 NOTE — Telephone Encounter (Signed)
Copied from [place] [phone]. Topic: Quick Communication - See Telephone Encounter >> Dec 10, 2017  1:30 PM Hewitt Shorts wrote: Pt is needing a refill on pantoprazole   [place] number [phone]

## 2017-12-11 NOTE — Telephone Encounter (Signed)
Pantoprazole refill Last OV:11/25/17 Last refill:08/15/16 (expired) LLV:D[facility]VEZ Pharmacy: [facility] [name], [place] AT [address] [phone] (Phone) [phone] (Fax)

## 2017-12-12 MED ORDER — PANTOPRAZOLE SODIUM 40 MG PO TBEC: 40.0000 mg | DELAYED_RELEASE_TABLET | Freq: Every day | ORAL | 2 refills | Status: DC

## 2017-12-17 ENCOUNTER — Ambulatory Visit: Payer: Medicare Other | Admitting: Internal Medicine

## 2017-12-17 ENCOUNTER — Encounter: Payer: Self-pay | Admitting: Internal Medicine

## 2017-12-17 VITALS — BP 106/60 | HR 92 | Ht 70.0 in | Wt 154.4 lb

## 2017-12-17 DIAGNOSIS — I482 Chronic atrial fibrillation, unspecified: Secondary | ICD-10-CM

## 2017-12-17 DIAGNOSIS — Z950 Presence of cardiac pacemaker: Secondary | ICD-10-CM | POA: Diagnosis not present

## 2017-12-17 DIAGNOSIS — I495 Sick sinus syndrome: Secondary | ICD-10-CM

## 2017-12-17 NOTE — Progress Notes (Signed)
HPI [name] returns today for ongoing followup of his atrial fib, symptomatic bradycardia due to complete heart block, s/p PPM insertion. He is fairly active. He has not had syncope and no recent falls. He has had a remote stroke with some residual and has tolerated systemic anti-coagulation. He denies anginal symptoms. No edema.  No Known Allergies   Current Outpatient Medications  Medication Sig Dispense Refill  . ferrous sulfate 325 (65 FE) MG tablet Take 1 tablet (325 mg total) by mouth daily with breakfast. 90 tablet 1  . metoprolol succinate (TOPROL-XL) 25 MG 24 hr tablet Take 1 tablet (25 mg total) by mouth every evening. 30 tablet 0  . mirtazapine (REMERON) 7.5 MG tablet Take 1 tablet (7.5 mg total) by mouth at bedtime. 30 tablet 0  . pantoprazole (PROTONIX) 40 MG tablet Take 1 tablet (40 mg total) by mouth daily. 30 tablet 2  . Rivaroxaban (XARELTO) 15 MG TABS tablet Take 1 tablet (15 mg total) by mouth daily with supper. 30 tablet 3  . sodium bicarbonate 650 MG tablet Take 2 tablets (1,300 mg total) by mouth 2 (two) times daily. 60 tablet 0   No current facility-administered medications for this visit.      Past Medical History:  Diagnosis Date  . Arthritis   . Atrial fibrillation ([place])   . CKD (chronic kidney disease)   . History of hiatal hernia   . Hx SBO    Last 2013 all treated conservatively  . Hypertension   . Presence of permanent cardiac pacemaker   . Prostate cancer ([place])   . Stroke [facility]) 2017   left facial drooping noted 08/14/2016  . Tachycardia-bradycardia syndrome ([place])   . Type II diabetes mellitus ([place])   . Ventral hernia    x3    ROS:   All systems reviewed and negative except as noted in the HPI.   Past Surgical History:  Procedure Laterality Date  . ABDOMINAL AORTOGRAM W/LOWER EXTREMITY N/A 08/07/2016   Procedure: Abdominal Aortogram w/Lower Extremity;  Surgeon: Wellington Hampshire, MD;  Location: [place] [facility];  Service:  Cardiovascular;  Laterality: N/A;  . CHOLECYSTECTOMY OPEN  03/31/2001   Dr Lindon Romp  . COLON SURGERY    . EXPLORATORY LAPAROTOMY  08/2004   Archie Endo 10/01/2010  . HEMORRHOID SURGERY    . HERNIA REPAIR    . HIATAL HERNIA REPAIR  2002  . INCISIONAL HERNIA REPAIR  08/2004   Archie Endo 10/01/2010  . INSERT / REPLACE / REMOVE PACEMAKER    . PERIPHERAL VASCULAR BALLOON ANGIOPLASTY  08/07/2016   Procedure: Peripheral Vascular Balloon Angioplasty;  Surgeon: Wellington Hampshire, MD;  Location: [place] [facility];  Service: Cardiovascular;;  left popliteal artery  . PERIPHERAL VASCULAR INTERVENTION  08/14/2016   popliteal artery/notes 08/14/2016  . PERIPHERAL VASCULAR INTERVENTION Left 08/14/2016   Procedure: Peripheral Vascular Intervention;  Surgeon: Wellington Hampshire, MD;  Location: [place] [facility];  Service: Cardiovascular;  Laterality: Left;  popliteal artery  . PERMANENT PACEMAKER GENERATOR CHANGE N/A 04/06/2012   Procedure: PERMANENT PACEMAKER GENERATOR CHANGE;  Surgeon: Evans Lance, MD;  Location: [facility] C[facility] LAB;  Service: Cardiovascular;  Laterality: N/A;  . SMALL INTESTINE SURGERY  09/16/2004   SBO resection, VH repair     Family History  Problem Relation Age of Onset  . Pneumonia Father   . Stroke Brother   . Stroke Sister   . Cancer Brother        type  unknown     Social History   Socioeconomic History  . Marital status: Widowed    Spouse name: Not on file  . Number of children: Not on file  . Years of education: Not on file  . Highest education level: Not on file  Occupational History  . Not on file  Social Needs  . Financial resource strain: Not on file  . Food insecurity:    Worry: Not on file    Inability: Not on file  . Transportation needs:    Medical: Not on file    Non-medical: Not on file  Tobacco Use  . Smoking status: Former Smoker    Packs/day: 1.50    Years: 15.00    Pack years: 22.50    Last attempt to quit: 04/16/1955    Years since quitting: 62.7  .  Smokeless tobacco: Former Systems developer    Types: [place] date: [birth year]17/1950  Substance and Sexual Activity  . Alcohol use: No    Comment: 08/14/2016 "drank beer when I was a teenager"  . Drug use: No  . Sexual activity: Not on file  Lifestyle  . Physical activity:    Days per week: Not on file    Minutes per session: Not on file  . Stress: Not on file  Relationships  . Social connections:    Talks on phone: Not on file    Gets together: Not on file    Attends religious service: Not on file    Active member of club or organization: Not on file    Attends meetings of clubs or organizations: Not on file    Relationship status: Not on file  . Intimate partner violence:    Fear of current or ex partner: Not on file    Emotionally abused: Not on file    Physically abused: Not on file    Forced sexual activity: Not on file  Other Topics Concern  . Not on file  Social History Narrative   Lives alone     BP 106/60   Pulse 92   Ht 5\' 10"  (1.778 m)   Wt 154 lb 6.4 oz (70 kg)   SpO2 99%   BMI 22.15 kg/m   Physical Exam:  Well appearing NAD HEENT: Unremarkable Neck:  No JVD, no thyromegally Lymphatics:  No adenopathy Back:  No CVA tenderness Lungs:  Clear HEART:  Regular rate rhythm, no murmurs, no rubs, no clicks Abd:  soft, positive bowel sounds, no organomegally, no rebound, no guarding Ext:  2 plus pulses, no edema, no cyanosis, no clubbing Skin:  No rashes no nodules Neuro:  CN II through XII intact, motor grossly intact  EKG - atrial fib with a controlled VR.  DEVICE  Normal device function.  See PaceArt for details.   Assess/Plan: 1. Atrial fib - his rates are controlled and he has had no bleeding on systemic anti-coagulation. 2. HTN - his blood pressure is well controlled. No change in meds. 3. PPM - his medtronic device is working normally. Will recheck in several months.   Mikle Bosworth.D.

## 2017-12-17 NOTE — Patient Instructions (Signed)
Medication Instructions:  Your physician recommends that you continue on your current medications as directed. Please refer to the Current Medication list given to you today.  Labwork: None ordered.  Testing/Procedures: None ordered.  Follow-Up: Your physician wants you to follow-up in: one year with Dr. Lovena Le.   You will receive a reminder letter in the mail two months in advance. If you don't receive a letter, please call our office to schedule the follow-up appointment.  Remote monitoring is used to monitor your Pacemaker from home. This monitoring reduces the number of office visits required to check your device to one time per year. It allows [place] to keep an eye on the functioning of your device to ensure it is working properly. You are scheduled for a device check from home on 12/25/2017. You may send your transmission at any time that day. If you have a wireless device, the transmission will be sent automatically. After your physician reviews your transmission, you will receive a postcard with your next transmission date.  Any Other Special Instructions Will Be Listed Below (If Applicable).  If you need a refill on your cardiac medications before your next appointment, please call your pharmacy.

## 2017-12-18 ENCOUNTER — Encounter: Payer: Medicare Other | Admitting: Family

## 2017-12-25 ENCOUNTER — Encounter: Payer: Medicare Other | Admitting: *Deleted

## 2017-12-26 ENCOUNTER — Telehealth: Payer: Self-pay

## 2017-12-26 NOTE — Telephone Encounter (Signed)
Attempted to confirm remote transmission with pt. No answer and was unable to leave a message.   

## 2017-12-30 ENCOUNTER — Encounter: Payer: Self-pay | Admitting: Cardiology

## 2017-12-30 ENCOUNTER — Other Ambulatory Visit: Payer: Self-pay

## 2017-12-30 MED ORDER — METOPROLOL SUCCINATE ER 25 MG PO TB24: 25.0000 mg | ORAL_TABLET | Freq: Every evening | ORAL | 0 refills | Status: DC

## 2017-12-30 MED ORDER — SODIUM BICARBONATE 650 MG PO TABS: 1300.0000 mg | ORAL_TABLET | Freq: Two times a day (BID) | ORAL | 0 refills | Status: DC

## 2018-01-02 ENCOUNTER — Ambulatory Visit (INDEPENDENT_AMBULATORY_CARE_PROVIDER_SITE_OTHER): Payer: Medicare Other | Admitting: *Deleted

## 2018-01-02 DIAGNOSIS — I495 Sick sinus syndrome: Secondary | ICD-10-CM | POA: Diagnosis not present

## 2018-01-02 DIAGNOSIS — I10 Essential (primary) hypertension: Secondary | ICD-10-CM

## 2018-01-02 NOTE — Progress Notes (Signed)
Remote pacemaker transmission.   

## 2018-01-06 LAB — CUP PACEART INCLINIC DEVICE CHECK
Battery Impedance: 2307 Ohm
Battery Remaining Longevity: 32 mo
Battery Voltage: 2.74 V
Implantable Lead Implant Date: 20060510
Implantable Lead Location: 753860
Implantable Pulse Generator Implant Date: 20131118
Lead Channel Pacing Threshold Pulse Width: 0.4 ms
Lead Channel Setting Pacing Pulse Width: 0.4 ms
Lead Channel Setting Sensing Sensitivity: 5.6 mV
MDC IDC MSMT LEADCHNL RA IMPEDANCE VALUE: 0 Ohm
MDC IDC MSMT LEADCHNL RV IMPEDANCE VALUE: 451 Ohm
MDC IDC MSMT LEADCHNL RV PACING THRESHOLD AMPLITUDE: 1 V
MDC IDC MSMT LEADCHNL RV SENSING INTR AMPL: 22.4 mV
MDC IDC SESS DTM: 20190731144517
MDC IDC SET LEADCHNL RV PACING AMPLITUDE: 2.5 V
MDC IDC STAT BRADY RV PERCENT PACED: 54 %

## 2018-01-16 ENCOUNTER — Encounter: Payer: Self-pay | Admitting: Family Medicine

## 2018-01-16 ENCOUNTER — Ambulatory Visit: Payer: Medicare Other | Admitting: Family Medicine

## 2018-01-16 VITALS — BP 110/72 | HR 83 | Ht 70.0 in | Wt 157.5 lb

## 2018-01-16 DIAGNOSIS — E860 Dehydration: Secondary | ICD-10-CM | POA: Diagnosis not present

## 2018-01-16 DIAGNOSIS — E119 Type 2 diabetes mellitus without complications: Secondary | ICD-10-CM | POA: Diagnosis not present

## 2018-01-16 DIAGNOSIS — D509 Iron deficiency anemia, unspecified: Secondary | ICD-10-CM

## 2018-01-16 DIAGNOSIS — I10 Essential (primary) hypertension: Secondary | ICD-10-CM

## 2018-01-16 LAB — CUP PACEART REMOTE DEVICE CHECK
Battery Impedance: 2284 Ohm
Battery Remaining Longevity: 33 mo
Battery Voltage: 2.74 V
Date Time Interrogation Session: 20190816002904
Implantable Lead Location: 753860
Implantable Lead Model: 5076
Implantable Pulse Generator Implant Date: 20131118
Lead Channel Impedance Value: 0 Ohm
Lead Channel Pacing Threshold Amplitude: 1.125 V
Lead Channel Pacing Threshold Pulse Width: 0.4 ms
Lead Channel Setting Pacing Amplitude: 2.5 V
Lead Channel Setting Sensing Sensitivity: 5.6 mV
MDC IDC LEAD IMPLANT DT: 20060510
MDC IDC MSMT LEADCHNL RV IMPEDANCE VALUE: 434 Ohm
MDC IDC SET LEADCHNL RV PACING PULSEWIDTH: 0.4 ms
MDC IDC STAT BRADY RV PERCENT PACED: 40 %

## 2018-01-16 LAB — IRON,TIBC AND FERRITIN PANEL
%SAT: 31 % (ref 20–48)
Ferritin: 53 ng/mL (ref 24–380)
Iron: 86 ug/dL (ref 50–180)
TIBC: 276 ug/dL (ref 250–425)

## 2018-01-16 LAB — BASIC METABOLIC PANEL
BUN: 11 mg/dL (ref 6–23)
CHLORIDE: 107 meq/L (ref 96–112)
CO2: 26 mEq/L (ref 19–32)
Calcium: 8.4 mg/dL (ref 8.4–10.5)
Creatinine, Ser: 1.54 mg/dL — ABNORMAL HIGH (ref 0.40–1.50)
GFR: 54.62 mL/min — AB (ref >60.00)
Glucose, Bld: 189 mg/dL — ABNORMAL HIGH (ref 70–99)
POTASSIUM: 4.5 meq/L (ref 3.5–5.1)
Sodium: 140 mEq/L (ref 135–145)

## 2018-01-16 LAB — CBC
HCT: 30.5 % — ABNORMAL LOW (ref 39.0–52.0)
HEMOGLOBIN: 9.9 g/dL — AB (ref 13.0–17.0)
MCHC: 32.3 g/dL (ref 30.0–36.0)
MCV: 82.3 fl (ref 78.0–100.0)
PLATELETS: 171 10*3/uL (ref 150.0–400.0)
RBC: 3.71 Mil/uL — AB (ref 4.22–5.81)
RDW: 18.1 % — ABNORMAL HIGH (ref 11.5–15.5)
WBC: 5.4 10*3/uL (ref 4.0–10.5)

## 2018-01-16 LAB — HEMOGLOBIN A1C: Hgb A1c MFr Bld: 6.3 % (ref 4.6–6.5)

## 2018-01-16 NOTE — Progress Notes (Addendum)
Subjective:  Patient ID: [name], male    DOB: 05/16/1924  Age: 84 y.o. MRN: [id number]  CC: Follow-up   HPI [name] presents for follow-up of his hypotension and dehydration.  He continues to drink coffee throughout the day and orange crushes.  He is no longer taking the Hyzaar.  He feels okay today but admits that he does not have the same energy he used to have in the past.  His appetite is been somewhat diminished.  He is here today with his daughter.  Outpatient Medications Prior to Visit  Medication Sig Dispense Refill  . ferrous sulfate 325 (65 FE) MG tablet Take 1 tablet (325 mg total) by mouth daily with breakfast. 90 tablet 1  . metoprolol succinate (TOPROL-XL) 25 MG 24 hr tablet Take 1 tablet (25 mg total) by mouth every evening. 30 tablet 0  . mirtazapine (REMERON) 7.5 MG tablet Take 1 tablet (7.5 mg total) by mouth at bedtime. 30 tablet 0  . pantoprazole (PROTONIX) 40 MG tablet Take 1 tablet (40 mg total) by mouth daily. 30 tablet 2  . Rivaroxaban (XARELTO) 15 MG TABS tablet Take 1 tablet (15 mg total) by mouth daily with supper. 30 tablet 3  . losartan-hydrochlorothiazide (HYZAAR) 100-12.5 MG tablet Take 1 tablet by mouth daily.    . sodium bicarbonate 650 MG tablet Take 2 tablets (1,300 mg total) by mouth 2 (two) times daily. 60 tablet 0   No facility-administered medications prior to visit.     ROS Review of Systems  Constitutional: Negative for chills, fatigue, fever and unexpected weight change.  Respiratory: Negative.   Cardiovascular: Negative.   Gastrointestinal: Negative.   Neurological: Negative for light-headedness.  Psychiatric/Behavioral: Negative.     Objective:  BP 110/72   Pulse 83   Ht 5\' 10"  (1.778 m)   Wt 157 lb 8 oz (71.4 kg)   SpO2 96%   BMI 22.60 kg/m   BP Readings from Last 3 Encounters:  01/16/18 110/72  12/17/17 106/60  12/10/17 110/70    Wt Readings from Last 3 Encounters:  01/16/18 157 lb 8 oz (71.4 kg)    12/17/17 154 lb 6.4 oz (70 kg)  12/10/17 154 lb 2 oz (69.9 kg)    Physical Exam  Constitutional: He is oriented to person, place, and time. He appears well-developed and well-nourished. No distress.  HENT:  Head: Normocephalic and atraumatic.  Right Ear: External ear normal.  Left Ear: External ear normal.  Eyes: Right eye exhibits no discharge. Left eye exhibits no discharge.  Cardiovascular: Normal rate, regular rhythm and normal heart sounds.  Pulmonary/Chest: Effort normal and breath sounds normal.  Neurological: He is alert and oriented to person, place, and time.  Skin: He is not diaphoretic.  Psychiatric: He has a normal mood and affect. His behavior is normal.    Lab Results  Component Value Date   WBC 7.8 12/02/2017   HGB 10.8 (L) 12/02/2017   HCT 31.5 (L) 12/02/2017   PLT 159 12/02/2017   GLUCOSE 96 12/10/2017   CHOL 122 07/23/2015   TRIG 55 07/23/2015   HDL 43 07/23/2015   LDLCALC 68 07/23/2015   ALT 13 12/05/2017   AST 20 12/05/2017   NA 138 12/10/2017   K 4.2 12/10/2017   CL 110 12/10/2017   CREATININE 1.42 12/10/2017   BUN 18 12/10/2017   CO2 19 12/10/2017   TSH 0.811 04/07/2010   PSA (H) 12/03/2007    6.13 (NOTE) Test Methodology:  [employer] PSA Result repeated and verified.   INR 1.3 (H) 07/30/2016   HGBA1C 6.7 (H) 01/13/2017    Ct Abdomen Pelvis Wo Contrast  Result Date: 12/01/2017 CLINICAL DATA:  Dehydration, lack of appetite and indigestion. EXAM: CT ABDOMEN AND PELVIS WITHOUT CONTRAST TECHNIQUE: Multidetector CT imaging of the abdomen and pelvis was performed following the standard protocol without IV contrast. COMPARISON:  CT of the abdomen and pelvis with contrast on 01/12/2017 FINDINGS: Lower chest: No acute abnormality. Hepatobiliary: No focal liver abnormality is seen. Status post cholecystectomy. No biliary dilatation. Pancreas: Atrophic pancreas. No evidence of pancreatic inflammation, mass or ductal dilatation. Spleen: Normal in size without  focal abnormality. Adrenals/Urinary Tract: No adrenal masses. The kidneys appear atrophic, especially the right. No hydronephrosis. The bladder appears unremarkable. Stomach/Bowel: No evidence of bowel obstruction or significant ileus. Stable moderate-sized hiatal hernia. Stable right-sided ventral hernia defect containing a segment of the transverse colon without signs of incarceration. Stable second smaller right ventral hernia defect just to the right of the umbilicus containing a segment of small bowel without signs of incarceration. Anastomotic suture line again evident in the right lower quadrant related to prior small bowel resection. No free air. Vascular/Lymphatic: No significant vascular findings are present. No enlarged abdominal or pelvic lymph nodes. Reproductive: Prostate is unremarkable. Other: No ascites or focal abscess identified. Musculoskeletal: No acute or significant osseous findings. IMPRESSION: No significant acute findings. Stable ventral hernias of the right abdominal wall containing a segment of the transverse colon and small bowel. Bowel at the level of both hernias demonstrate no evidence of incarceration. Electronically Signed   By: Aletta Edouard M.D.   On: 12/01/2017 16:21    Assessment & Plan:   [name] was seen today for follow-up.  Diagnoses and all orders for this visit:  Microcytic anemia -     CBC -     Iron, TIBC and Ferritin Panel  Dehydration  Essential hypertension -     Basic metabolic panel  Type 2 diabetes mellitus without complication, without long-term current use of insulin ([name]) -     Hemoglobin A1c   I have discontinued [name]'s sodium bicarbonate and losartan-hydrochlorothiazide. I am also having him maintain his Rivaroxaban, ferrous sulfate, mirtazapine, pantoprazole, and metoprolol succinate.  No orders of the defined types were placed in this encounter.  We will go ahead and start the stop the bicarbonate for now.  Had a  conversation about drinking coffee all day and its relationship with dehydration.  Could also be a cardiac irritant.  He will hold coffee consumption to a cup or 2 in the morning and then stop.  Orange crushes are not good for his blood sugars.  Once again counseled him to drink 1 of those a day.  Suggested that he try flavored waters.  Blood pressure and pulse rate are at an acceptable range on the metoprolol alone.  Hemoglobin A1c is pending today.  Multiple glucose measurements within the BMP have been normal.  He will follow-up in 1 month and bring his medicines with him.  Follow-up: Return in about 1 month (around 02/16/2018).  [name], MD

## 2018-01-28 ENCOUNTER — Other Ambulatory Visit: Payer: Self-pay | Admitting: Family Medicine

## 2018-02-17 ENCOUNTER — Ambulatory Visit: Payer: Medicare Other | Admitting: Family Medicine

## 2018-03-17 ENCOUNTER — Other Ambulatory Visit: Payer: Self-pay | Admitting: Family Medicine

## 2018-04-03 ENCOUNTER — Encounter: Payer: Medicare Other | Admitting: *Deleted

## 2018-04-03 ENCOUNTER — Telehealth: Payer: Self-pay

## 2018-04-03 NOTE — Telephone Encounter (Signed)
Attempted to confirm remote transmission with pt. No answer and was unable to leave a message.   

## 2018-04-09 ENCOUNTER — Encounter: Payer: Self-pay | Admitting: Cardiology

## 2018-04-10 ENCOUNTER — Encounter: Payer: Self-pay | Admitting: Family Medicine

## 2018-04-10 ENCOUNTER — Ambulatory Visit: Payer: Medicare Other | Admitting: Family Medicine

## 2018-04-10 VITALS — BP 128/80 | HR 79 | Temp 97.8°F | Ht 70.0 in | Wt 155.4 lb

## 2018-04-10 DIAGNOSIS — R630 Anorexia: Secondary | ICD-10-CM

## 2018-04-10 DIAGNOSIS — D509 Iron deficiency anemia, unspecified: Secondary | ICD-10-CM | POA: Diagnosis not present

## 2018-04-10 DIAGNOSIS — I480 Paroxysmal atrial fibrillation: Secondary | ICD-10-CM

## 2018-04-10 LAB — CBC
HCT: 33.4 % — ABNORMAL LOW (ref 38.5–50.0)
Hemoglobin: 10.9 g/dL — ABNORMAL LOW (ref 13.2–17.1)
MCH: 26.8 pg — AB (ref 27.0–33.0)
MCHC: 32.6 g/dL (ref 32.0–36.0)
MCV: 82.3 fL (ref 80.0–100.0)
MPV: 13 fL — ABNORMAL HIGH (ref 7.5–12.5)
Platelets: 147 10*3/uL (ref 140–400)
RBC: 4.06 10*6/uL — ABNORMAL LOW (ref 4.20–5.80)
RDW: 16.1 % — ABNORMAL HIGH (ref 11.0–15.0)
WBC: 6.2 10*3/uL (ref 3.8–10.8)

## 2018-04-10 LAB — BASIC METABOLIC PANEL
BUN / CREAT RATIO: 10 (calc) (ref 6–22)
BUN: 15 mg/dL (ref 7–25)
CHLORIDE: 108 mmol/L (ref 98–110)
CO2: 25 mmol/L (ref 20–32)
Calcium: 8.3 mg/dL — ABNORMAL LOW (ref 8.6–10.3)
Creat: 1.45 mg/dL — ABNORMAL HIGH (ref 0.70–1.11)
Glucose, Bld: 91 mg/dL (ref 65–99)
POTASSIUM: 5.5 mmol/L — AB (ref 3.5–5.3)
SODIUM: 139 mmol/L (ref 135–146)

## 2018-04-10 LAB — IRON,TIBC AND FERRITIN PANEL
%SAT: 25 % (calc) (ref 20–48)
Ferritin: 39 ng/mL (ref 24–380)
IRON: 63 ug/dL (ref 50–180)
TIBC: 255 mcg/dL (calc) (ref 250–425)

## 2018-04-10 MED ORDER — METOPROLOL SUCCINATE ER 25 MG PO TB24: ORAL_TABLET | ORAL | 1 refills | Status: DC

## 2018-04-10 MED ORDER — FERROUS SULFATE 325 (65 FE) MG PO TABS: 325.0000 mg | ORAL_TABLET | Freq: Every day | ORAL | 1 refills | Status: DC

## 2018-04-10 MED ORDER — MEGESTROL ACETATE 400 MG/10ML PO SUSP: 400.0000 mg | Freq: Every day | ORAL | 5 refills | Status: DC

## 2018-04-10 MED ORDER — RIVAROXABAN 15 MG PO TABS: 15.0000 mg | ORAL_TABLET | Freq: Every day | ORAL | 3 refills | Status: DC

## 2018-04-10 NOTE — Progress Notes (Signed)
Established Patient Office Visit  Subjective:  Patient ID: [name], male    DOB: March 18, 1924  Age: 84 y.o. MRN: [id number]  CC:  Chief Complaint  Patient presents with  . URI    HPI [name] presents for evaluation of cold symptoms. Patient has been having a runny nose, with no fever.  Also here for follow-up of his atrial fib, microcytic anemia and decreased appetite.  He requests a refill on the Megace because it did help his appetite.  He has been taking his metoprolol and iron pills intermittently.  He has not been taking the Remeron.  Past Medical History:  Diagnosis Date  . Arthritis   . Atrial fibrillation ([place])   . CKD (chronic kidney disease)   . History of hiatal hernia   . Hx SBO    Last 2013 all treated conservatively  . Hypertension   . Presence of permanent cardiac pacemaker   . Prostate cancer ([place])   . Stroke [facility]) 2017   left facial drooping noted 08/14/2016  . Tachycardia-bradycardia syndrome ([place])   . Type II diabetes mellitus ([place])   . Ventral hernia    x3    Past Surgical History:  Procedure Laterality Date  . ABDOMINAL AORTOGRAM W/LOWER EXTREMITY N/A 08/07/2016   Procedure: Abdominal Aortogram w/Lower Extremity;  Surgeon: Wellington Hampshire, MD;  Location: [employer] [facility];  Service: Cardiovascular;  Laterality: N/A;  . CHOLECYSTECTOMY OPEN  03/31/2001   Dr Lindon Romp  . COLON SURGERY    . EXPLORATORY LAPAROTOMY  08/2004   Archie Endo 10/01/2010  . HEMORRHOID SURGERY    . HERNIA REPAIR    . HIATAL HERNIA REPAIR  2002  . INCISIONAL HERNIA REPAIR  08/2004   Archie Endo 10/01/2010  . INSERT / REPLACE / REMOVE PACEMAKER    . PERIPHERAL VASCULAR BALLOON ANGIOPLASTY  08/07/2016   Procedure: Peripheral Vascular Balloon Angioplasty;  Surgeon: Wellington Hampshire, MD;  Location: [place] [facility];  Service: Cardiovascular;;  left popliteal artery  . PERIPHERAL VASCULAR INTERVENTION  08/14/2016   popliteal artery/notes 08/14/2016  . PERIPHERAL VASCULAR  INTERVENTION Left 08/14/2016   Procedure: Peripheral Vascular Intervention;  Surgeon: Wellington Hampshire, MD;  Location: [place] [facility];  Service: Cardiovascular;  Laterality: Left;  popliteal artery  . PERMANENT PACEMAKER GENERATOR CHANGE N/A 04/06/2012   Procedure: PERMANENT PACEMAKER GENERATOR CHANGE;  Surgeon: Evans Lance, MD;  Location: [facility] CATH LAB;  Service: Cardiovascular;  Laterality: N/A;  . SMALL INTESTINE SURGERY  09/16/2004   SBO resection, VH repair    Family History  Problem Relation Age of Onset  . Pneumonia Father   . Stroke Brother   . Stroke Sister   . Cancer Brother        type unknown    Social History   Socioeconomic History  . Marital status: Widowed    Spouse name: Not on file  . Number of children: Not on file  . Years of education: Not on file  . Highest education level: Not on file  Occupational History  . Not on file  Social Needs  . Financial resource strain: Not on file  . Food insecurity:    Worry: Not on file    Inability: Not on file  . Transportation needs:    Medical: Not on file    Non-medical: Not on file  Tobacco Use  . Smoking status: Former Smoker    Packs/day: 1.50    Years: 15.00    Pack  years: 22.50    Last attempt to quit: 04/16/1955    Years since quitting: 63.0  . Smokeless tobacco: Former Systems developer    Types: [place] date: 08/17/1952  Substance and Sexual Activity  . Alcohol use: No    Comment: 08/14/2016 "drank beer when I was a teenager"  . Drug use: No  . Sexual activity: Not on file  Lifestyle  . Physical activity:    Days per week: Not on file    Minutes per session: Not on file  . Stress: Not on file  Relationships  . Social connections:    Talks on phone: Not on file    Gets together: Not on file    Attends religious service: Not on file    Active member of club or organization: Not on file    Attends meetings of clubs or organizations: Not on file    Relationship status: Not on file  . Intimate  partner violence:    Fear of current or ex partner: Not on file    Emotionally abused: Not on file    Physically abused: Not on file    Forced sexual activity: Not on file  Other Topics Concern  . Not on file  Social History Narrative   Lives alone    Outpatient Medications Prior to Visit  Medication Sig Dispense Refill  . mirtazapine (REMERON) 7.5 MG tablet Take 1 tablet (7.5 mg total) by mouth at bedtime. 30 tablet 0  . pantoprazole (PROTONIX) 40 MG tablet TAKE 1 TABLET(40 MG) BY MOUTH DAILY 90 tablet 1  . ferrous sulfate 325 (65 FE) MG tablet Take 1 tablet (325 mg total) by mouth daily with breakfast. 90 tablet 1  . metoprolol succinate (TOPROL-XL) 25 MG 24 hr tablet TAKE 1 TABLET(25 MG) BY MOUTH EVERY EVENING 30 tablet 2  . Rivaroxaban (XARELTO) 15 MG TABS tablet Take 1 tablet (15 mg total) by mouth daily with supper. 30 tablet 3   No facility-administered medications prior to visit.     No Known Allergies  ROS Review of Systems  Constitutional: Negative for chills, diaphoresis, fatigue, fever and unexpected weight change.  HENT: Positive for congestion and postnasal drip. Negative for rhinorrhea, sinus pressure and sinus pain.   Eyes: Negative for photophobia and visual disturbance.  Respiratory: Negative for cough, shortness of breath and wheezing.   Cardiovascular: Negative.   Gastrointestinal: Negative.   Endocrine: Negative for polyphagia and polyuria.  Genitourinary: Negative.   Musculoskeletal: Negative for arthralgias and myalgias.  Skin: Negative for pallor and rash.  Neurological: Negative for seizures, numbness and headaches.  Psychiatric/Behavioral: Negative.       Objective:    Physical Exam  Constitutional: He is oriented to person, place, and time. He appears well-developed and well-nourished. No distress.  HENT:  Head: Normocephalic and atraumatic.  Right Ear: External ear normal.  Left Ear: External ear normal.  Mouth/Throat: Oropharynx is clear  and moist. No oropharyngeal exudate.  Eyes: Pupils are equal, round, and reactive to light. Conjunctivae are normal. Right eye exhibits no discharge. Left eye exhibits no discharge.  Neck: No JVD present. No tracheal deviation present. No thyromegaly present.  Cardiovascular: An irregularly irregular rhythm present.  Pulmonary/Chest: Effort normal and breath sounds normal. No respiratory distress. He has no wheezes. He has no rales.  Lymphadenopathy:    He has no cervical adenopathy.  Neurological: He is alert and oriented to person, place, and time.  Skin: Skin is warm and dry. He  is not diaphoretic.  Psychiatric: He has a normal mood and affect. His behavior is normal.    BP 128/80   Pulse 79   Temp 97.8 F (36.6 C) (Oral)   Ht 5\' 10"  (1.778 m)   Wt 155 lb 6 oz (70.5 kg)   SpO2 100%   BMI 22.29 kg/m  Wt Readings from Last 3 Encounters:  04/10/18 155 lb 6 oz (70.5 kg)  01/16/18 157 lb 8 oz (71.4 kg)  12/17/17 154 lb 6.4 oz (70 kg)     Health Maintenance Due  Topic Date Due  . FOOT EXAM  02/29/1936  . OPHTHALMOLOGY EXAM  02/29/1936  . URINE MICROALBUMIN  02/29/1936  . TETANUS/TDAP  02/28/1945  . PNA vac Low Risk Adult (1 of 2 - PCV13) 03/01/1991  . INFLUENZA VACCINE  12/18/2017    There are no preventive care reminders to display for this patient.  Lab Results  Component Value Date   TSH 0.811 04/07/2010   Lab Results  Component Value Date   WBC 5.4 01/16/2018   HGB 9.9 (L) 01/16/2018   HCT 30.5 (L) 01/16/2018   MCV 82.3 01/16/2018   PLT 171.0 01/16/2018   Lab Results  Component Value Date   NA 140 01/16/2018   K 4.5 01/16/2018   CO2 26 01/16/2018   GLUCOSE 189 (H) 01/16/2018   BUN 11 01/16/2018   CREATININE 1.54 (H) 01/16/2018   BILITOT 0.6 12/05/2017   ALKPHOS 57 12/05/2017   AST 20 12/05/2017   ALT 13 12/05/2017   PROT 6.3 (L) 12/05/2017   ALBUMIN 3.3 (L) 12/05/2017   CALCIUM 8.4 01/16/2018   ANIONGAP 6 12/05/2017   GFR 54.62 (L) 01/16/2018    Lab Results  Component Value Date   CHOL 122 07/23/2015   Lab Results  Component Value Date   HDL 43 07/23/2015   Lab Results  Component Value Date   LDLCALC 68 07/23/2015   Lab Results  Component Value Date   TRIG 55 07/23/2015   Lab Results  Component Value Date   CHOLHDL 2.8 07/23/2015   Lab Results  Component Value Date   HGBA1C 6.3 01/16/2018      Assessment & Plan:   Problem List Items Addressed This Visit      Cardiovascular and Mediastinum   Atrial fibrillation ([place]) - Primary   Relevant Medications   metoprolol succinate (TOPROL-XL) 25 MG 24 hr tablet   Rivaroxaban (XARELTO) 15 MG TABS tablet   Other Relevant Orders   Basic metabolic panel     Other   Microcytic anemia   Relevant Medications   ferrous sulfate 325 (65 FE) MG tablet   Other Relevant Orders   CBC   Iron, TIBC and Ferritin Panel   Decreased appetite   Relevant Medications   megestrol (MEGACE) 400 MG/10ML suspension   Iron deficiency anemia   Relevant Medications   ferrous sulfate 325 (65 FE) MG tablet   Other Relevant Orders   CBC   Iron, TIBC and Ferritin Panel      Meds ordered this encounter  Medications  . DISCONTD: ferrous sulfate 325 (65 FE) MG tablet    Sig: Take 1 tablet (325 mg total) by mouth daily with breakfast.    Dispense:  90 tablet    Refill:  1  . metoprolol succinate (TOPROL-XL) 25 MG 24 hr tablet    Sig: TAKE 1 TABLET(25 MG) BY MOUTH EVERY EVENING    Dispense:  90 tablet    Refill:  1  . Rivaroxaban (XARELTO) 15 MG TABS tablet    Sig: Take 1 tablet (15 mg total) by mouth daily with supper.    Dispense:  30 tablet    Refill:  3  . megestrol (MEGACE) 400 MG/10ML suspension    Sig: Take 10 mLs (400 mg total) by mouth daily.    Dispense:  240 mL    Refill:  5  . ferrous sulfate 325 (65 FE) MG tablet    Sig: Take 1 tablet (325 mg total) by mouth daily with breakfast.    Dispense:  90 tablet    Refill:  1    Follow-up: Return in about 3 months  (around 07/11/2018), or if symptoms worsen or fail to improve.

## 2018-04-13 ENCOUNTER — Other Ambulatory Visit: Payer: Self-pay

## 2018-04-13 DIAGNOSIS — E875 Hyperkalemia: Secondary | ICD-10-CM

## 2018-04-18 ENCOUNTER — Other Ambulatory Visit: Payer: Self-pay | Admitting: Family Medicine

## 2018-04-18 DIAGNOSIS — N183 Chronic kidney disease, stage 3 unspecified: Secondary | ICD-10-CM

## 2018-04-18 DIAGNOSIS — I10 Essential (primary) hypertension: Secondary | ICD-10-CM

## 2018-04-18 NOTE — Telephone Encounter (Signed)
Dr. Ethelene Hal - it looks like this Rx was discontinued after his hospital stay.

## 2018-04-21 ENCOUNTER — Ambulatory Visit (INDEPENDENT_AMBULATORY_CARE_PROVIDER_SITE_OTHER): Payer: Medicare Other

## 2018-04-21 DIAGNOSIS — I495 Sick sinus syndrome: Secondary | ICD-10-CM | POA: Diagnosis not present

## 2018-04-21 NOTE — Progress Notes (Signed)
Remote pacemaker transmission.   

## 2018-04-28 ENCOUNTER — Encounter: Payer: Self-pay | Admitting: Cardiology

## 2018-04-28 ENCOUNTER — Encounter: Payer: Self-pay | Admitting: Family Medicine

## 2018-04-28 ENCOUNTER — Ambulatory Visit: Payer: Medicare Other | Admitting: Family Medicine

## 2018-04-28 VITALS — BP 120/70 | HR 114 | Ht 70.0 in | Wt 155.0 lb

## 2018-04-28 DIAGNOSIS — J301 Allergic rhinitis due to pollen: Secondary | ICD-10-CM | POA: Diagnosis not present

## 2018-04-28 MED ORDER — FLUTICASONE PROPIONATE 50 MCG/ACT NA SUSP: 2.0000 | Freq: Every day | NASAL | 6 refills | Status: DC

## 2018-04-28 NOTE — Progress Notes (Signed)
Established Patient Office Visit  Subjective:  Patient ID: [name], male    DOB: 16-Oct-1923  Age: 84 y.o. MRN: [id number]  CC:  Chief Complaint  Patient presents with  . Medication Refill    HPI [name] presents for medication refill.  Patient is here with his daughter requesting a refill of losartan.  This medication was discontinued back in July after his hospitalization for hypotension and acute kidney injury.  He has not been taking it.  Daughter is not certain that he takes his metoprolol regularly.  He has ongoing issues with watery eyes, runny nose with clear rhinorrhea and sneezing.  He continues to enjoy working out in his yard.  He has had no fevers chills, facial pressure or teeth pain.  There is been no purulence.  Past Medical History:  Diagnosis Date  . Arthritis   . Atrial fibrillation ([place])   . CKD (chronic kidney disease)   . History of hiatal hernia   . Hx SBO    Last 2013 all treated conservatively  . Hypertension   . Presence of permanent cardiac pacemaker   . Prostate cancer ([place])   . Stroke [facility]) 2017   left facial drooping noted 08/14/2016  . Tachycardia-bradycardia syndrome ([place])   . Type II diabetes mellitus ([place])   . Ventral hernia    x3    Past Surgical History:  Procedure Laterality Date  . ABDOMINAL AORTOGRAM W/LOWER EXTREMITY N/A 08/07/2016   Procedure: Abdominal Aortogram w/Lower Extremity;  Surgeon: Wellington Hampshire, MD;  Location: [place] [facility];  Service: Cardiovascular;  Laterality: N/A;  . CHOLECYSTECTOMY OPEN  03/31/2001   Dr Lindon Romp  . COLON SURGERY    . EXPLORATORY LAPAROTOMY  08/2004   Archie Endo 10/01/2010  . HEMORRHOID SURGERY    . HERNIA REPAIR    . HIATAL HERNIA REPAIR  2002  . INCISIONAL HERNIA REPAIR  08/2004   Archie Endo 10/01/2010  . INSERT / REPLACE / REMOVE PACEMAKER    . PERIPHERAL VASCULAR BALLOON ANGIOPLASTY  08/07/2016   Procedure: Peripheral Vascular Balloon Angioplasty;  Surgeon: Wellington Hampshire, MD;   Location: [place] [facility];  Service: Cardiovascular;;  left popliteal artery  . PERIPHERAL VASCULAR INTERVENTION  08/14/2016   popliteal artery/notes 08/14/2016  . PERIPHERAL VASCULAR INTERVENTION Left 08/14/2016   Procedure: Peripheral Vascular Intervention;  Surgeon: Wellington Hampshire, MD;  Location: [place] [facility];  Service: Cardiovascular;  Laterality: Left;  popliteal artery  . PERMANENT PACEMAKER GENERATOR CHANGE N/A 04/06/2012   Procedure: PERMANENT PACEMAKER GENERATOR CHANGE;  Surgeon: Evans Lance, MD;  Location: [facility] CATH LAB;  Service: Cardiovascular;  Laterality: N/A;  . SMALL INTESTINE SURGERY  09/16/2004   SBO resection, VH repair    Family History  Problem Relation Age of Onset  . Pneumonia Father   . Stroke Brother   . Stroke Sister   . Cancer Brother        type unknown    Social History   Socioeconomic History  . Marital status: Widowed    Spouse name: Not on file  . Number of children: Not on file  . Years of education: Not on file  . Highest education level: Not on file  Occupational History  . Not on file  Social Needs  . Financial resource strain: Not on file  . Food insecurity:    Worry: Not on file    Inability: Not on file  . Transportation needs:    Medical: Not  on file    Non-medical: Not on file  Tobacco Use  . Smoking status: Former Smoker    Packs/day: 1.50    Years: 15.00    Pack years: 22.50    Last attempt to quit: 04/16/1955    Years since quitting: 63.0  . Smokeless tobacco: Former Systems developer    Types: [place] date: 08/17/1952  Substance and Sexual Activity  . Alcohol use: No    Comment: 08/14/2016 "drank beer when I was a teenager"  . Drug use: No  . Sexual activity: Not on file  Lifestyle  . Physical activity:    Days per week: Not on file    Minutes per session: Not on file  . Stress: Not on file  Relationships  . Social connections:    Talks on phone: Not on file    Gets together: Not on file    Attends religious  service: Not on file    Active member of club or organization: Not on file    Attends meetings of clubs or organizations: Not on file    Relationship status: Not on file  . Intimate partner violence:    Fear of current or ex partner: Not on file    Emotionally abused: Not on file    Physically abused: Not on file    Forced sexual activity: Not on file  Other Topics Concern  . Not on file  Social History Narrative   Lives alone    Outpatient Medications Prior to Visit  Medication Sig Dispense Refill  . ferrous sulfate 325 (65 FE) MG tablet Take 1 tablet (325 mg total) by mouth daily with breakfast. 90 tablet 1  . megestrol (MEGACE) 400 MG/10ML suspension Take 10 mLs (400 mg total) by mouth daily. 240 mL 5  . metoprolol succinate (TOPROL-XL) 25 MG 24 hr tablet TAKE 1 TABLET(25 MG) BY MOUTH EVERY EVENING 90 tablet 1  . mirtazapine (REMERON) 7.5 MG tablet Take 1 tablet (7.5 mg total) by mouth at bedtime. 30 tablet 0  . pantoprazole (PROTONIX) 40 MG tablet TAKE 1 TABLET(40 MG) BY MOUTH DAILY 90 tablet 1  . Rivaroxaban (XARELTO) 15 MG TABS tablet Take 1 tablet (15 mg total) by mouth daily with supper. 30 tablet 3   No facility-administered medications prior to visit.     No Known Allergies  ROS Review of Systems  Constitutional: Negative for chills, fatigue, fever and unexpected weight change.  HENT: Positive for congestion, postnasal drip, rhinorrhea and sneezing. Negative for sinus pain and sore throat.   Eyes: Positive for itching.  Respiratory: Negative for choking and chest tightness.   Cardiovascular: Negative.   Gastrointestinal: Negative.   Skin: Negative for color change and pallor.  Hematological: Does not bruise/bleed easily.  Psychiatric/Behavioral: Negative.       Objective:    Physical Exam  Constitutional: He is oriented to person, place, and time. He appears well-developed and well-nourished. No distress.  HENT:  Head: Normocephalic and atraumatic.  Right  Ear: External ear normal.  Left Ear: External ear normal.  Mouth/Throat: Oropharynx is clear and moist. No oropharyngeal exudate.  Eyes: Pupils are equal, round, and reactive to light. Conjunctivae are normal. Right eye exhibits no discharge. Left eye exhibits no discharge. No scleral icterus.  Neck: Neck supple. No JVD present. No tracheal deviation present. No thyromegaly present.  Cardiovascular: An irregularly irregular rhythm present.  Pulmonary/Chest: Effort normal and breath sounds normal. No stridor.  Lymphadenopathy:    He has  no cervical adenopathy.  Neurological: He is alert and oriented to person, place, and time.  Skin: Skin is warm and dry. He is not diaphoretic.  Psychiatric: He has a normal mood and affect. His behavior is normal.    BP 120/70   Pulse (!) 114   Ht 5\' 10"  (1.778 m)   Wt 155 lb (70.3 kg)   SpO2 98%   BMI 22.24 kg/m  Wt Readings from Last 3 Encounters:  04/28/18 155 lb (70.3 kg)  04/10/18 155 lb 6 oz (70.5 kg)  01/16/18 157 lb 8 oz (71.4 kg)   BP Readings from Last 3 Encounters:  04/28/18 120/70  04/10/18 128/80  01/16/18 110/72   Health Maintenance Due  Topic Date Due  . FOOT EXAM  02/29/1936  . OPHTHALMOLOGY EXAM  02/29/1936  . URINE MICROALBUMIN  02/29/1936  . TETANUS/TDAP  02/28/1945  . PNA vac Low Risk Adult (1 of 2 - PCV13) 03/01/1991    There are no preventive care reminders to display for this patient.  Lab Results  Component Value Date   TSH 0.811 04/07/2010   Lab Results  Component Value Date   WBC 6.2 04/10/2018   HGB 10.9 (L) 04/10/2018   HCT 33.4 (L) 04/10/2018   MCV 82.3 04/10/2018   PLT 147 04/10/2018   Lab Results  Component Value Date   NA 139 04/10/2018   K 5.5 (H) 04/10/2018   CO2 25 04/10/2018   GLUCOSE 91 04/10/2018   BUN 15 04/10/2018   CREATININE 1.45 (H) 04/10/2018   BILITOT 0.6 12/05/2017   ALKPHOS 57 12/05/2017   AST 20 12/05/2017   ALT 13 12/05/2017   PROT 6.3 (L) 12/05/2017   ALBUMIN 3.3 (L)  12/05/2017   CALCIUM 8.3 (L) 04/10/2018   ANIONGAP 6 12/05/2017   GFR 54.62 (L) 01/16/2018   Lab Results  Component Value Date   CHOL 122 07/23/2015   Lab Results  Component Value Date   HDL 43 07/23/2015   Lab Results  Component Value Date   LDLCALC 68 07/23/2015   Lab Results  Component Value Date   TRIG 55 07/23/2015   Lab Results  Component Value Date   CHOLHDL 2.8 07/23/2015   Lab Results  Component Value Date   HGBA1C 6.3 01/16/2018      Assessment & Plan:   Problem List Items Addressed This Visit      Respiratory   Allergic rhinitis due to pollen - Primary   Relevant Medications   fluticasone (FLONASE) 50 MCG/ACT nasal spray      Meds ordered this encounter  Medications  . fluticasone (FLONASE) 50 MCG/ACT nasal spray    Sig: Place 2 sprays into both nostrils daily.    Dispense:  16 g    Refill:  6    Follow-up: No follow-ups on file.   Asked daughter to please ensure the patient has been taking his metoprolol.  Will use Flonase.  Follow-up in 3 months

## 2018-06-11 LAB — CUP PACEART REMOTE DEVICE CHECK
Battery Impedance: 2556 Ohm
Brady Statistic RV Percent Paced: 24 %
Implantable Lead Implant Date: 20060510
Lead Channel Impedance Value: 0 Ohm
Lead Channel Impedance Value: 476 Ohm
Lead Channel Pacing Threshold Amplitude: 0.75 V
Lead Channel Pacing Threshold Pulse Width: 0.4 ms
Lead Channel Setting Pacing Amplitude: 2.5 V
Lead Channel Setting Pacing Pulse Width: 0.4 ms
Lead Channel Setting Sensing Sensitivity: 5.6 mV
MDC IDC LEAD LOCATION: 753860
MDC IDC MSMT BATTERY REMAINING LONGEVITY: 30 mo
MDC IDC MSMT BATTERY VOLTAGE: 2.74 V
MDC IDC PG IMPLANT DT: 20131118
MDC IDC SESS DTM: 20191203151108

## 2018-06-15 ENCOUNTER — Other Ambulatory Visit: Payer: Self-pay

## 2018-06-15 DIAGNOSIS — I480 Paroxysmal atrial fibrillation: Secondary | ICD-10-CM

## 2018-06-15 MED ORDER — RIVAROXABAN 15 MG PO TABS: 15.0000 mg | ORAL_TABLET | Freq: Every day | ORAL | 3 refills | Status: DC

## 2018-06-17 ENCOUNTER — Encounter: Payer: Self-pay | Admitting: Family Medicine

## 2018-06-18 ENCOUNTER — Ambulatory Visit: Payer: Medicare Other | Admitting: Family Medicine

## 2018-06-18 ENCOUNTER — Encounter: Payer: Self-pay | Admitting: Family Medicine

## 2018-06-18 VITALS — BP 122/76 | HR 85 | Temp 97.8°F | Ht 70.0 in | Wt 156.1 lb

## 2018-06-18 DIAGNOSIS — K921 Melena: Secondary | ICD-10-CM | POA: Diagnosis not present

## 2018-06-18 DIAGNOSIS — K5901 Slow transit constipation: Secondary | ICD-10-CM | POA: Insufficient documentation

## 2018-06-18 LAB — CBC
HCT: 35 % — ABNORMAL LOW (ref 39.0–52.0)
Hemoglobin: 11.2 g/dL — ABNORMAL LOW (ref 13.0–17.0)
MCHC: 32 g/dL (ref 30.0–36.0)
MCV: 83.1 fl (ref 78.0–100.0)
Platelets: 149 10*3/uL — ABNORMAL LOW (ref 150.0–400.0)
RBC: 4.21 Mil/uL — ABNORMAL LOW (ref 4.22–5.81)
RDW: 17.9 % — AB (ref 11.5–15.5)
WBC: 6.5 10*3/uL (ref 4.0–10.5)

## 2018-06-18 NOTE — Progress Notes (Signed)
Established Patient Office Visit  Subjective:  Patient ID: [name], male    DOB: [birth year]  Age: 85 y.o. MRN: [id number]  CC:  Chief Complaint  Patient presents with  . Melena    HPI [name] presents for evaluation and treatment of a one-week history of dark tarry stools.  Patient has seen no blood in his stools.  Denies indigestion abdominal pain bleeding per rectum or blood in the toilet bowl.  He does deal with chronic constipation and takes a stool softener for this.  He is taking Xarelto.  He denies excessive bleeding anywhere or hematuria.  He is also taking iron regularly.  Past Medical History:  Diagnosis Date  . Arthritis   . Atrial fibrillation ([place])   . CKD (chronic kidney disease)   . History of hiatal hernia   . Hx SBO    Last 2013 all treated conservatively  . Hypertension   . Presence of permanent cardiac pacemaker   . Prostate cancer ([place])   . Stroke [facility]) 2017   left facial drooping noted 08/14/2016  . Tachycardia-bradycardia syndrome ([place])   . Type II diabetes mellitus ([place])   . Ventral hernia    x3    Past Surgical History:  Procedure Laterality Date  . ABDOMINAL AORTOGRAM W/LOWER EXTREMITY N/A 08/07/2016   Procedure: Abdominal Aortogram w/Lower Extremity;  Surgeon: Wellington Hampshire, MD;  Location: [place] [facility];  Service: Cardiovascular;  Laterality: N/A;  . CHOLECYSTECTOMY OPEN  03/31/2001   Dr Lindon Romp  . COLON SURGERY    . EXPLORATORY LAPAROTOMY  08/2004   Archie Endo 10/01/2010  . HEMORRHOID SURGERY    . HERNIA REPAIR    . HIATAL HERNIA REPAIR  2002  . INCISIONAL HERNIA REPAIR  08/2004   Archie Endo 10/01/2010  . INSERT / REPLACE / REMOVE PACEMAKER    . PERIPHERAL VASCULAR BALLOON ANGIOPLASTY  08/07/2016   Procedure: Peripheral Vascular Balloon Angioplasty;  Surgeon: Wellington Hampshire, MD;  Location: [place] [facility];  Service: Cardiovascular;;  left popliteal artery  . PERIPHERAL VASCULAR INTERVENTION  08/14/2016   popliteal  artery/notes 08/14/2016  . PERIPHERAL VASCULAR INTERVENTION Left 08/14/2016   Procedure: Peripheral Vascular Intervention;  Surgeon: Wellington Hampshire, MD;  Location: [place] [facility];  Service: Cardiovascular;  Laterality: Left;  popliteal artery  . PERMANENT PACEMAKER GENERATOR CHANGE N/A 04/06/2012   Procedure: PERMANENT PACEMAKER GENERATOR CHANGE;  Surgeon: Evans Lance, MD;  Location: [facility] CATH LAB;  Service: Cardiovascular;  Laterality: N/A;  . SMALL INTESTINE SURGERY  09/16/2004   SBO resection, VH repair    Family History  Problem Relation Age of Onset  . Pneumonia Father   . Stroke Brother   . Stroke Sister   . Cancer Brother        type unknown    Social History   Socioeconomic History  . Marital status: Widowed    Spouse name: Not on file  . Number of children: Not on file  . Years of education: Not on file  . Highest education level: Not on file  Occupational History  . Not on file  Social Needs  . Financial resource strain: Not on file  . Food insecurity:    Worry: Not on file    Inability: Not on file  . Transportation needs:    Medical: Not on file    Non-medical: Not on file  Tobacco Use  . Smoking status: Former Smoker    Packs/day: 1.50  Years: 15.00    Pack years: 22.50    Last attempt to quit: 04/16/1955    Years since quitting: 63.2  . Smokeless tobacco: Former Systems developer    Types: [place] date: 08/17/1952  Substance and Sexual Activity  . Alcohol use: No    Comment: 08/14/2016 "drank beer when I was a teenager"  . Drug use: No  . Sexual activity: Not on file  Lifestyle  . Physical activity:    Days per week: Not on file    Minutes per session: Not on file  . Stress: Not on file  Relationships  . Social connections:    Talks on phone: Not on file    Gets together: Not on file    Attends religious service: Not on file    Active member of club or organization: Not on file    Attends meetings of clubs or organizations: Not on file     Relationship status: Not on file  . Intimate partner violence:    Fear of current or ex partner: Not on file    Emotionally abused: Not on file    Physically abused: Not on file    Forced sexual activity: Not on file  Other Topics Concern  . Not on file  Social History Narrative   Lives alone    Outpatient Medications Prior to Visit  Medication Sig Dispense Refill  . ferrous sulfate 325 (65 FE) MG tablet Take 1 tablet (325 mg total) by mouth daily with breakfast. 90 tablet 1  . fluticasone (FLONASE) 50 MCG/ACT nasal spray Place 2 sprays into both nostrils daily. 16 g 6  . megestrol (MEGACE) 400 MG/10ML suspension Take 10 mLs (400 mg total) by mouth daily. 240 mL 5  . metoprolol succinate (TOPROL-XL) 25 MG 24 hr tablet TAKE 1 TABLET(25 MG) BY MOUTH EVERY EVENING 90 tablet 1  . mirtazapine (REMERON) 7.5 MG tablet Take 1 tablet (7.5 mg total) by mouth at bedtime. 30 tablet 0  . pantoprazole (PROTONIX) 40 MG tablet TAKE 1 TABLET(40 MG) BY MOUTH DAILY 90 tablet 1  . Rivaroxaban (XARELTO) 15 MG TABS tablet Take 1 tablet (15 mg total) by mouth daily with supper. 30 tablet 3   No facility-administered medications prior to visit.     No Known Allergies  ROS Review of Systems  HENT: Negative.   Eyes: Negative.   Respiratory: Negative.   Cardiovascular: Negative.   Gastrointestinal: Positive for constipation. Negative for abdominal distention, abdominal pain, anal bleeding, blood in stool, nausea and vomiting.  Genitourinary: Negative.  Negative for hematuria.  Musculoskeletal: Negative for joint swelling and myalgias.  Neurological: Negative for dizziness, light-headedness and headaches.  Psychiatric/Behavioral: Negative.       Objective:    Physical Exam  Constitutional: He is oriented to person, place, and time. He appears well-developed and well-nourished. No distress.  HENT:  Head: Normocephalic and atraumatic.  Right Ear: External ear normal.  Left Ear: External ear normal.   Mouth/Throat: Oropharynx is clear and moist. No oropharyngeal exudate.  Eyes: Pupils are equal, round, and reactive to light. Conjunctivae are normal. Right eye exhibits no discharge. Left eye exhibits no discharge. No scleral icterus.  Neck: No JVD present. No tracheal deviation present.  Cardiovascular: Normal rate, regular rhythm and normal heart sounds.  Pulmonary/Chest: Effort normal. No stridor.  Abdominal: Bowel sounds are normal. He exhibits no distension. There is no abdominal tenderness. There is no rebound and no guarding.  Genitourinary: Rectum:  Guaiac result negative.     No rectal mass, anal fissure, tenderness, external hemorrhoid or internal hemorrhoid.  Prostate is enlarged. Prostate is not tender.  Neurological: He is alert and oriented to person, place, and time.  Skin: He is not diaphoretic.    BP 122/76   Pulse 85   Temp 97.8 F (36.6 C) (Oral)   Ht 5\' 10"  (1.778 m)   Wt 156 lb 2 oz (70.8 kg)   SpO2 99%   BMI 22.40 kg/m  Wt Readings from Last 3 Encounters:  06/18/18 156 lb 2 oz (70.8 kg)  04/28/18 155 lb (70.3 kg)  04/10/18 155 lb 6 oz (70.5 kg)   BP Readings from Last 3 Encounters:  06/18/18 122/76  04/28/18 120/70  04/10/18 128/80   Guideline developer:  UpToDate (see UpToDate for funding source) Date Released: June 2014  Health Maintenance Due  Topic Date Due  . FOOT EXAM  02/29/1936  . OPHTHALMOLOGY EXAM  02/29/1936  . URINE MICROALBUMIN  02/29/1936  . TETANUS/TDAP  02/28/1945  . PNA vac Low Risk Adult (1 of 2 - PCV13) 03/01/1991    There are no preventive care reminders to display for this patient.  Lab Results  Component Value Date   TSH 0.811 04/07/2010   Lab Results  Component Value Date   WBC 6.2 04/10/2018   HGB 10.9 (L) 04/10/2018   HCT 33.4 (L) 04/10/2018   MCV 82.3 04/10/2018   PLT 147 04/10/2018   Lab Results  Component Value Date   NA 139 04/10/2018   K 5.5 (H) 04/10/2018   CO2 25 04/10/2018   GLUCOSE 91  04/10/2018   BUN 15 04/10/2018   CREATININE 1.45 (H) 04/10/2018   BILITOT 0.6 12/05/2017   ALKPHOS 57 12/05/2017   AST 20 12/05/2017   ALT 13 12/05/2017   PROT 6.3 (L) 12/05/2017   ALBUMIN 3.3 (L) 12/05/2017   CALCIUM 8.3 (L) 04/10/2018   ANIONGAP 6 12/05/2017   GFR 54.62 (L) 01/16/2018   Lab Results  Component Value Date   CHOL 122 07/23/2015   Lab Results  Component Value Date   HDL 43 07/23/2015   Lab Results  Component Value Date   LDLCALC 68 07/23/2015   Lab Results  Component Value Date   TRIG 55 07/23/2015   Lab Results  Component Value Date   CHOLHDL 2.8 07/23/2015   Lab Results  Component Value Date   HGBA1C 6.3 01/16/2018      Assessment & Plan:   Problem List Items Addressed This Visit      Digestive   Melena - Primary   Relevant Orders   CBC   Iron, TIBC and Ferritin Panel   Constipation by delayed colonic transit      No orders of the defined types were placed in this encounter.   Follow-up: Return if symptoms worsen or fail to improve.   We will continue Xarelto and iron for now.  Checking CBC with iron levels.  Information was given on constipation.  Continue stool softener.  Follow-up with any bleeding per rectum or blood in the urine.

## 2018-06-18 NOTE — Patient Instructions (Signed)

## 2018-06-19 LAB — IRON,TIBC AND FERRITIN PANEL
%SAT: 21 % (calc) (ref 20–48)
Ferritin: 45 ng/mL (ref 24–380)
Iron: 58 ug/dL (ref 50–180)
TIBC: 270 mcg/dL (calc) (ref 250–425)

## 2018-07-06 ENCOUNTER — Telehealth: Payer: Self-pay | Admitting: Internal Medicine

## 2018-07-06 NOTE — Telephone Encounter (Signed)
Niece calling  Needs a letter for the [place] stating a date his Pacemaker was put in. She can not find an exact date on MyChart. She thought they requested a letter previously  You can fax the letter to her Fax:[phone]

## 2018-07-06 NOTE — Telephone Encounter (Signed)
Faxed letter as requested.  Was only able to determine date of generator change in 2013.    Unable to find date of original placement of PPM.

## 2018-07-21 ENCOUNTER — Encounter: Payer: Medicare Other | Admitting: *Deleted

## 2018-07-22 ENCOUNTER — Telehealth: Payer: Self-pay

## 2018-07-22 NOTE — Telephone Encounter (Signed)
Left message for patient to remind of missed remote transmission.  

## 2018-07-28 ENCOUNTER — Ambulatory Visit: Payer: Medicare Other | Admitting: Family Medicine

## 2018-07-29 ENCOUNTER — Encounter: Payer: Self-pay | Admitting: Cardiology

## 2018-08-03 ENCOUNTER — Ambulatory Visit: Payer: Medicare Other | Admitting: *Deleted

## 2018-08-05 ENCOUNTER — Other Ambulatory Visit: Payer: Self-pay

## 2018-08-05 LAB — CUP PACEART REMOTE DEVICE CHECK
Battery Impedance: 2779 Ohm
Battery Remaining Longevity: 27 mo
Brady Statistic RV Percent Paced: 25 %
Date Time Interrogation Session: 20200316225819
Implantable Lead Implant Date: 20060510
Implantable Lead Model: 5076
Implantable Pulse Generator Implant Date: 20131118
Lead Channel Impedance Value: 0 Ohm
Lead Channel Impedance Value: 495 Ohm
Lead Channel Pacing Threshold Amplitude: 0.75 V
Lead Channel Pacing Threshold Pulse Width: 0.4 ms
Lead Channel Setting Pacing Amplitude: 2.5 V
Lead Channel Setting Sensing Sensitivity: 5.6 mV
MDC IDC LEAD LOCATION: 753860
MDC IDC MSMT BATTERY VOLTAGE: 2.73 V
MDC IDC SET LEADCHNL RV PACING PULSEWIDTH: 0.4 ms

## 2018-08-11 NOTE — Progress Notes (Signed)
No show

## 2018-09-15 ENCOUNTER — Other Ambulatory Visit: Payer: Self-pay | Admitting: Family Medicine

## 2018-10-03 ENCOUNTER — Other Ambulatory Visit: Payer: Self-pay | Admitting: Family Medicine

## 2018-10-03 DIAGNOSIS — I480 Paroxysmal atrial fibrillation: Secondary | ICD-10-CM

## 2018-10-04 ENCOUNTER — Telehealth: Payer: Self-pay | Admitting: Family Medicine

## 2018-10-05 ENCOUNTER — Other Ambulatory Visit: Payer: Self-pay | Admitting: Family Medicine

## 2018-10-05 NOTE — Telephone Encounter (Signed)
Needs ov

## 2018-10-07 NOTE — Telephone Encounter (Signed)
Pt's daughter called back in returning the call to schedule visit.    Please assist.

## 2018-10-08 NOTE — Telephone Encounter (Signed)
Please help to get patient scheduled, okay to be a telephone visit.

## 2018-10-09 ENCOUNTER — Encounter: Payer: Self-pay | Admitting: Family Medicine

## 2018-10-09 ENCOUNTER — Ambulatory Visit (INDEPENDENT_AMBULATORY_CARE_PROVIDER_SITE_OTHER): Payer: Medicare Other | Admitting: Family Medicine

## 2018-10-09 VITALS — Ht 70.0 in

## 2018-10-09 DIAGNOSIS — D509 Iron deficiency anemia, unspecified: Secondary | ICD-10-CM | POA: Diagnosis not present

## 2018-10-09 DIAGNOSIS — G629 Polyneuropathy, unspecified: Secondary | ICD-10-CM | POA: Diagnosis not present

## 2018-10-09 DIAGNOSIS — I480 Paroxysmal atrial fibrillation: Secondary | ICD-10-CM

## 2018-10-09 DIAGNOSIS — G4700 Insomnia, unspecified: Secondary | ICD-10-CM | POA: Insufficient documentation

## 2018-10-09 DIAGNOSIS — E119 Type 2 diabetes mellitus without complications: Secondary | ICD-10-CM

## 2018-10-09 DIAGNOSIS — F5104 Psychophysiologic insomnia: Secondary | ICD-10-CM

## 2018-10-09 MED ORDER — GABAPENTIN 100 MG PO CAPS: 100.0000 mg | ORAL_CAPSULE | Freq: Every day | ORAL | 3 refills | Status: DC

## 2018-10-09 NOTE — Progress Notes (Signed)
Established Patient Office Visit  Subjective:  Patient ID: [name], male    DOB: Mar 10, 1924  Age: 85 y.o. MRN: [id number]  CC:  Chief Complaint  Patient presents with  . Medication Refill    HPI [name] presents for follow-up of his anemia, diabetes and insomnia.  Patient does not does not always take his iron pills but tries to.  Family sometimes finds it difficult to be there when he takes them in the evening before bed.  Diabetes has been controlled with diet and has not required treatment.  He tells of numbness followed by burning in his left leg.  This occurs primarily at night.  He says that the mirtazapine is helping him sleep but continues to have some difficulty.  His family tells me that he he naps at times.  He has not been getting up through the night.  His niece and granddaughter are present for the encounter.  Past Medical History:  Diagnosis Date  . Arthritis   . Atrial fibrillation ([place])   . CKD (chronic kidney disease)   . History of hiatal hernia   . Hx SBO    Last 2013 all treated conservatively  . Hypertension   . Presence of permanent cardiac pacemaker   . Prostate cancer ([place])   . Stroke [facility]) 2017   left facial drooping noted 08/14/2016  . Tachycardia-bradycardia syndrome ([place])   . Type II diabetes mellitus ([place])   . Ventral hernia    x3    Past Surgical History:  Procedure Laterality Date  . ABDOMINAL AORTOGRAM W/LOWER EXTREMITY N/A 08/07/2016   Procedure: Abdominal Aortogram w/Lower Extremity;  Surgeon: Wellington Hampshire, MD;  Location: [place] [facility];  Service: Cardiovascular;  Laterality: N/A;  . CHOLECYSTECTOMY OPEN  03/31/2001   Dr Lindon Romp  . COLON SURGERY    . EXPLORATORY LAPAROTOMY  08/2004   Archie Endo 10/01/2010  . HEMORRHOID SURGERY    . HERNIA REPAIR    . HIATAL HERNIA REPAIR  2002  . INCISIONAL HERNIA REPAIR  08/2004   Archie Endo 10/01/2010  . INSERT / REPLACE / REMOVE PACEMAKER    . PERIPHERAL VASCULAR BALLOON ANGIOPLASTY   08/07/2016   Procedure: Peripheral Vascular Balloon Angioplasty;  Surgeon: Wellington Hampshire, MD;  Location: [place] CV LAB;  Service: Cardiovascular;;  left popliteal artery  . PERIPHERAL VASCULAR INTERVENTION  08/14/2016   popliteal artery/notes 08/14/2016  . PERIPHERAL VASCULAR INTERVENTION Left 08/14/2016   Procedure: Peripheral Vascular Intervention;  Surgeon: Wellington Hampshire, MD;  Location: [place] [facility];  Service: Cardiovascular;  Laterality: Left;  popliteal artery  . PERMANENT PACEMAKER GENERATOR CHANGE N/A 04/06/2012   Procedure: PERMANENT PACEMAKER GENERATOR CHANGE;  Surgeon: Evans Lance, MD;  Location: [facility] C[facility];  Service: Cardiovascular;  Laterality: N/A;  . SMALL INTESTINE SURGERY  09/16/2004   SBO resection, VH repair    Family History  Problem Relation Age of Onset  . Pneumonia Father   . Stroke Brother   . Stroke Sister   . Cancer Brother        type unknown    Social History   Socioeconomic History  . Marital status: Widowed    Spouse name: Not on file  . Number of children: Not on file  . Years of education: Not on file  . Highest education level: Not on file  Occupational History  . Not on file  Social Needs  . Financial resource strain: Not on file  . Food  insecurity:    Worry: Not on file    Inability: Not on file  . Transportation needs:    Medical: Not on file    Non-medical: Not on file  Tobacco Use  . Smoking status: Former Smoker    Packs/day: 1.50    Years: 15.00    Pack years: 22.50    Last attempt to quit: 04/16/1955    Years since quitting: 63.5  . Smokeless tobacco: Former Systems developer    Types: [place] date: 08/17/1952  Substance and Sexual Activity  . Alcohol use: No    Comment: 08/14/2016 "drank beer when I was a teenager"  . Drug use: No  . Sexual activity: Not on file  Lifestyle  . Physical activity:    Days per week: Not on file    Minutes per session: Not on file  . Stress: Not on file  Relationships  . Social  connections:    Talks on phone: Not on file    Gets together: Not on file    Attends religious service: Not on file    Active member of club or organization: Not on file    Attends meetings of clubs or organizations: Not on file    Relationship status: Not on file  . Intimate partner violence:    Fear of current or ex partner: Not on file    Emotionally abused: Not on file    Physically abused: Not on file    Forced sexual activity: Not on file  Other Topics Concern  . Not on file  Social History Narrative   Lives alone    Outpatient Medications Prior to Visit  Medication Sig Dispense Refill  . ferrous sulfate 325 (65 FE) MG tablet Take 1 tablet (325 mg total) by mouth daily with breakfast. 90 tablet 1  . fluticasone (FLONASE) 50 MCG/ACT nasal spray Place 2 sprays into both nostrils daily. 16 g 6  . losartan-hydrochlorothiazide (HYZAAR) 100-12.5 MG tablet TAKE 1 TABLET BY MOUTH EVERY DAY 30 tablet 0  . megestrol (MEGACE) 400 MG/10ML suspension Take 10 mLs (400 mg total) by mouth daily. 240 mL 5  . metoprolol succinate (TOPROL-XL) 25 MG 24 hr tablet TAKE 1 TABLET(25 MG) BY MOUTH EVERY EVENING 90 tablet 0  . mirtazapine (REMERON) 7.5 MG tablet Take 1 tablet (7.5 mg total) by mouth at bedtime. 30 tablet 0  . pantoprazole (PROTONIX) 40 MG tablet TAKE 1 TABLET(40 MG) BY MOUTH DAILY 90 tablet 1  . Rivaroxaban (XARELTO) 15 MG TABS tablet Take 1 tablet (15 mg total) by mouth daily with supper. 30 tablet 3   No facility-administered medications prior to visit.     No Known Allergies  ROS Review of Systems  Constitutional: Negative for diaphoresis, fatigue, fever and unexpected weight change.  Eyes: Negative for photophobia and visual disturbance.  Respiratory: Negative.   Cardiovascular: Negative.   Gastrointestinal: Negative for anal bleeding and blood in stool.  Endocrine: Negative for polyphagia and polyuria.  Genitourinary: Negative.  Negative for hematuria.  Neurological:  Positive for numbness. Negative for seizures and speech difficulty.  Hematological: Does not bruise/bleed easily.  Psychiatric/Behavioral: Positive for sleep disturbance.      Objective:    Physical Exam  Constitutional: He is oriented to person, place, and time. No distress.  Pulmonary/Chest: Effort normal.  Neurological: He is alert and oriented to person, place, and time.  Psychiatric: He has a normal mood and affect. His behavior is normal.    Ht 5'  10" (1.778 m)   BMI 22.40 kg/m  Wt Readings from Last 3 Encounters:  06/18/18 156 lb 2 oz (70.8 kg)  04/28/18 155 lb (70.3 kg)  04/10/18 155 lb 6 oz (70.5 kg)     Health Maintenance Due  Topic Date Due  . FOOT EXAM  02/29/1936  . OPHTHALMOLOGY EXAM  02/29/1936  . TETANUS/TDAP  02/28/1945  . PNA vac Low Risk Adult (1 of 2 - PCV13) 03/01/1991  . HEMOGLOBIN A1C  07/18/2018    There are no preventive care reminders to display for this patient.  Lab Results  Component Value Date   TSH 0.811 04/07/2010   Lab Results  Component Value Date   WBC 6.5 06/18/2018   HGB 11.2 (L) 06/18/2018   HCT 35.0 (L) 06/18/2018   MCV 83.1 06/18/2018   PLT 149.0 (L) 06/18/2018   Lab Results  Component Value Date   NA 139 04/10/2018   K 5.5 (H) 04/10/2018   CO2 25 04/10/2018   GLUCOSE 91 04/10/2018   BUN 15 04/10/2018   CREATININE 1.45 (H) 04/10/2018   BILITOT 0.6 12/05/2017   ALKPHOS 57 12/05/2017   AST 20 12/05/2017   ALT 13 12/05/2017   PROT 6.3 (L) 12/05/2017   ALBUMIN 3.3 (L) 12/05/2017   CALCIUM 8.3 (L) 04/10/2018   ANIONGAP 6 12/05/2017   GFR 54.62 (L) 01/16/2018   Lab Results  Component Value Date   CHOL 122 07/23/2015   Lab Results  Component Value Date   HDL 43 07/23/2015   Lab Results  Component Value Date   LDLCALC 68 07/23/2015   Lab Results  Component Value Date   TRIG 55 07/23/2015   Lab Results  Component Value Date   CHOLHDL 2.8 07/23/2015   Lab Results  Component Value Date   HGBA1C 6.3  01/16/2018      Assessment & Plan:   Problem List Items Addressed This Visit      Cardiovascular and Mediastinum   Atrial fibrillation ([place]) - Primary     Endocrine   Diabetes ([place])   Relevant Orders   Basic metabolic panel     Nervous and Auditory   Neuropathy   Relevant Medications   gabapentin (NEURONTIN) 100 MG capsule     Other   Microcytic anemia   Relevant Orders   CBC   Iron, TIBC and Ferritin Panel   Vitamin B12   Iron deficiency anemia   Relevant Orders   CBC   Iron, TIBC and Ferritin Panel   Psychophysiological insomnia      Meds ordered this encounter  Medications  . gabapentin (NEURONTIN) 100 MG capsule    Sig: Take 1 capsule (100 mg total) by mouth at bedtime.    Dispense:  30 capsule    Refill:  3    Follow-up: Return in about 3 months (around 01/09/2019).    [name], MDVirtual Visit via Telephone Note  I connected with [name] on 10/09/18 at  3:30 PM EDT by telephone and verified that I am speaking with the correct person using two identifiers.  Interactive video and audio telecommunications were attempted between myself and the patient. However they failed due to the patient having technical difficulties or not having access to video capability. We continued and completed with audio only.  Location: Patient: home Provider:    I discussed the limitations, risks, security and privacy concerns of performing an evaluation and management service by telephone and the availability of in person appointments.  I also discussed with the patient that there may be a patient responsible charge related to this service. The patient expressed understanding and agreed to proceed.   History of Present Illness:    Observations/Objective:   Assessment and Plan:   Follow Up Instructions:    I discussed the assessment and treatment plan with the patient. The patient was provided an opportunity to ask questions and all were  answered. The patient agreed with the plan and demonstrated an understanding of the instructions.   The patient was advised to call back or seek an in-person evaluation if the symptoms worsen or if the condition fails to improve as anticipated.  I provided 25 minutes of non-face-to-face time during this encounter.  Encouraged him to increase his activity during the day as tolerated.  We will consider visiting home health.  Continue all medicines as above.  Will make appointment for labs.  Follow-up in 3 months

## 2018-10-24 ENCOUNTER — Other Ambulatory Visit: Payer: Self-pay | Admitting: Family Medicine

## 2018-10-24 DIAGNOSIS — I480 Paroxysmal atrial fibrillation: Secondary | ICD-10-CM

## 2018-11-02 ENCOUNTER — Other Ambulatory Visit: Payer: Self-pay | Admitting: Family Medicine

## 2018-11-03 ENCOUNTER — Telehealth: Payer: Self-pay

## 2018-11-03 NOTE — Telephone Encounter (Signed)
Copied from [place] [id number]. Topic: General - Inquiry >> Nov 03, 2018 11:35 AM Virl Axe D wrote: Reason for CRM: Pt's grandaughter [name] called to follow up on her request to Dr. Ethelene Hal regarding having a home health agency coming in to check on pt each day. Please advise. CB#[phone]

## 2018-11-06 ENCOUNTER — Other Ambulatory Visit: Payer: Self-pay

## 2018-11-06 ENCOUNTER — Other Ambulatory Visit (HOSPITAL_COMMUNITY): Payer: Self-pay | Admitting: Cardiovascular Disease

## 2018-11-06 ENCOUNTER — Telehealth: Payer: Self-pay | Admitting: *Deleted

## 2018-11-06 ENCOUNTER — Ambulatory Visit (HOSPITAL_COMMUNITY)
Admission: RE | Admit: 2018-11-06 | Discharge: 2018-11-06 | Disposition: A | Discharge status: Home / Self Care | Payer: Medicare Other | Source: Ambulatory Visit | Attending: Cardiology | Admitting: Cardiology

## 2018-11-06 DIAGNOSIS — I739 Peripheral vascular disease, unspecified: Secondary | ICD-10-CM

## 2018-11-06 DIAGNOSIS — Z95820 Peripheral vascular angioplasty status with implants and grafts: Secondary | ICD-10-CM

## 2018-11-06 NOTE — Telephone Encounter (Signed)
Attempted to reach the granddaughter. She came with the patient today for his LEA and ABI. While here, she left a message that the patient was started on Gabapentin 100 mg once daily for left foot pain. She asked for a call back to know if this was okay.   The voicemail was full.

## 2018-11-06 NOTE — Telephone Encounter (Signed)
Spoke with the granddaughter. The patient was recently prescribed Gabapentin 100 mg once daily for his left foot pain. She is concerned that the dosage may be too strong for him and would like to have Dr. Tyrell Antonio opinion on it.  She also wanted to know if the patient could take the medication as needed. She has been advised that for full effectiveness of the medication it should be taken daily and not as needed.

## 2018-11-06 NOTE — Telephone Encounter (Signed)
That is a very small dose of gabapentin.

## 2018-11-09 NOTE — Telephone Encounter (Signed)
Left message for dtr of dr arida's recommendations.

## 2018-11-12 ENCOUNTER — Telehealth: Payer: Self-pay

## 2018-11-12 ENCOUNTER — Ambulatory Visit: Payer: Self-pay | Admitting: *Deleted

## 2018-11-12 DIAGNOSIS — N189 Chronic kidney disease, unspecified: Secondary | ICD-10-CM

## 2018-11-12 NOTE — Telephone Encounter (Signed)
Referral placed, they will reach out to pt to schedule a time to come by and do their evaluation.

## 2018-11-12 NOTE — Telephone Encounter (Signed)
Message from Erick Blinks sent at 11/12/2018 10:03 AM EDT  Pt does not have AC in his house, 90 degrees in the house and he is comfortable. His granddaughter is concerned that he will have a heat stroke. Please advise    Had gangrene two years ago, currently complains about leg pain at night in both legs. Legs reported to be cold without extreme heat in the house.   Best Contact: [phone]    Pt's granddaughter contacted and states that she is not with the pt at this time. Pt's granddaughter states that pt can be contacted on his temporary number [phone].   Attempted to call granddaughter on number provided but automated message received that call can not be completed at this time.

## 2018-11-12 NOTE — Telephone Encounter (Signed)
Copied from [place] [id number]. Topic: General - Call Back - No Documentation >> Nov 12, 2018  9:45 AM Erick Blinks wrote: Reason for CRM: Pt's daughter called to request home health care nurse. Please advise  Best Contact:

## 2018-11-12 NOTE — Telephone Encounter (Signed)
Pt and pt's granddaughter calling with complaints of pain in the left foot. Pt states that around 3-4 in the morning it will be sore and can barely walk. Pt states that with taking about 2-3 steps feels like something is attached to it. Soreness in left but not right but feels like something is attached like something is laying on it. Pain has been present for the  last 3-4 months. Pt's granddaughter also states that the pt had a recent visit with Dr. Garald Braver, a vascular surgeon and xrays were taking and she is waiting to hear back from that office. Pt has been prescribed Gabapentin as needed but the pt's granddaughter is not sure if the pt has been taking the medication. Pt's granddaughter also concerned that it is too hot in the home(80 degrees previously) and is concerned with the pt being at risk for having a heat stroke. Pt states that he feels comfortable in the home and states that his blood is too thin and if it is below 70 he gets too cold.. Pt states that he feels ok in the house now. No complaints of dizziness, headache, or weakness voiced at this time.  Rates pain at 9. Attempted to call FC x 3 but no answer at this time. Pt can be called at [phone] to schedule appt. If no answer pt's granddaughter, who is present with the pt can be contacted at [phone] to schedule appt.   Reason for Disposition . Foot pain is a chronic symptom (recurrent or ongoing AND present > 4 weeks)  Answer Assessment - Initial Assessment Questions 1. ONSET: "When did the pain start?"      Last 3-4 months 2. LOCATION: "Where is the pain located?"      Bilateral feet 3. PAIN: "How bad is the pain?"    (Scale 1-10; or mild, moderate, severe)   -  MILD (1-3): doesn't interfere with normal activities    -  MODERATE (4-7): interferes with normal activities (e.g., work or school) or awakens from sleep, limping    -  SEVERE (8-10): excruciating pain, unable to do any normal activities, unable to walk     9 4. WORK OR  EXERCISE: "Has there been any recent work or exercise that involved this part of the body?"      No 5. CAUSE: "What do you think is causing the foot pain?"     Possibly neuropathy 6. OTHER SYMPTOMS: "Do you have any other symptoms?" (e.g., leg pain, rash, fever, numbness)     Feels like something is on the bottom of foot 7. PREGNANCY: "Is there any chance you are pregnant?" "When was your last menstrual period?"     n/a  Protocols used: FOOT PAIN-A-AH

## 2018-11-12 NOTE — Addendum Note (Signed)
Addended by: Rodrigo Ran on: 11/12/2018 01:42 PM   Modules accepted: Orders

## 2018-11-12 NOTE — Telephone Encounter (Signed)
Okay, thanks

## 2018-11-16 ENCOUNTER — Telehealth: Payer: Self-pay | Admitting: Cardiovascular Disease

## 2018-11-16 NOTE — Telephone Encounter (Signed)
I did! They are coming in tomorrow afternoon.

## 2018-11-16 NOTE — Telephone Encounter (Signed)
Schedule for [id number] with daughter.

## 2018-11-16 NOTE — Telephone Encounter (Signed)
Granddaughter updated with test results and voiced understanding.

## 2018-11-16 NOTE — Telephone Encounter (Signed)
New Message   Patients granddaughter [name] is calling on his behalf. They are wanting the results of his doppler study. Please call to discuss.

## 2018-11-17 ENCOUNTER — Encounter: Payer: Self-pay | Admitting: Family Medicine

## 2018-11-17 ENCOUNTER — Ambulatory Visit (INDEPENDENT_AMBULATORY_CARE_PROVIDER_SITE_OTHER): Payer: Medicare Other | Admitting: Family Medicine

## 2018-11-17 VITALS — BP 130/80 | HR 77 | Ht 70.0 in | Wt 147.0 lb

## 2018-11-17 DIAGNOSIS — G629 Polyneuropathy, unspecified: Secondary | ICD-10-CM | POA: Diagnosis not present

## 2018-11-17 DIAGNOSIS — M79671 Pain in right foot: Secondary | ICD-10-CM | POA: Diagnosis not present

## 2018-11-17 DIAGNOSIS — M79672 Pain in left foot: Secondary | ICD-10-CM

## 2018-11-17 DIAGNOSIS — E119 Type 2 diabetes mellitus without complications: Secondary | ICD-10-CM | POA: Diagnosis not present

## 2018-11-17 DIAGNOSIS — D509 Iron deficiency anemia, unspecified: Secondary | ICD-10-CM | POA: Diagnosis not present

## 2018-11-17 MED ORDER — GABAPENTIN 100 MG PO CAPS: 200.0000 mg | ORAL_CAPSULE | Freq: Every day | ORAL | 2 refills | Status: DC

## 2018-11-17 NOTE — Addendum Note (Signed)
Addended by: Lynnea Ferrier on: 11/17/2018 02:29 PM   Modules accepted: Orders

## 2018-11-17 NOTE — Progress Notes (Signed)
Established Patient Office Visit  Subjective:  Patient ID: [name], male    DOB: [birth year]  Age: 85 y.o. MRN: [id number]  CC:  Chief Complaint  Patient presents with  . Foot Pain    HPI [name] presents for follow-up of pain he is been having in his feet.  Pain is involving the left foot mostly out of the little toe.  It burns and stings and he notices this mostly in the morning.  He sometimes feels as though things are attached to the soles of his feet.  Patient has been given Neurontin 100 mg nightly and his compliance with this medication is questionable.  There is not always someone there to remind him to take it.  Daughter admits that he sometimes confuses days with a pillbox that they have been using for him.  Recent vascular studies showed ABIs of 1.2 on the right and 1.16 on the left.  Patient is seeing podiatry on a regular basis who regularly checks.  Past Medical History:  Diagnosis Date  . Arthritis   . Atrial fibrillation ([place])   . CKD (chronic kidney disease)   . History of hiatal hernia   . Hx SBO    Last 2013 all treated conservatively  . Hypertension   . Presence of permanent cardiac pacemaker   . Prostate cancer ([place])   . Stroke [facility]) 2017   left facial drooping noted 08/14/2016  . Tachycardia-bradycardia syndrome ([place])   . Type II diabetes mellitus ([place])   . Ventral hernia    x3    Past Surgical History:  Procedure Laterality Date  . ABDOMINAL AORTOGRAM W/LOWER EXTREMITY N/A 08/07/2016   Procedure: Abdominal Aortogram w/Lower Extremity;  Surgeon: Wellington Hampshire, MD;  Location: [place] [facility];  Service: Cardiovascular;  Laterality: N/A;  . CHOLECYSTECTOMY OPEN  03/31/2001   Dr Lindon Romp  . COLON SURGERY    . EXPLORATORY LAPAROTOMY  08/2004   Archie Endo 10/01/2010  . HEMORRHOID SURGERY    . HERNIA REPAIR    . HIATAL HERNIA REPAIR  2002  . INCISIONAL HERNIA REPAIR  08/2004   Archie Endo 10/01/2010  . INSERT / REPLACE / REMOVE PACEMAKER    .  PERIPHERAL VASCULAR BALLOON ANGIOPLASTY  08/07/2016   Procedure: Peripheral Vascular Balloon Angioplasty;  Surgeon: Wellington Hampshire, MD;  Location: [place] [facility];  Service: Cardiovascular;;  left popliteal artery  . PERIPHERAL VASCULAR INTERVENTION  08/14/2016   popliteal artery/notes 08/14/2016  . PERIPHERAL VASCULAR INTERVENTION Left 08/14/2016   Procedure: Peripheral Vascular Intervention;  Surgeon: Wellington Hampshire, MD;  Location: [place] [facility];  Service: Cardiovascular;  Laterality: Left;  popliteal artery  . PERMANENT PACEMAKER GENERATOR CHANGE N/A 04/06/2012   Procedure: PERMANENT PACEMAKER GENERATOR CHANGE;  Surgeon: Evans Lance, MD;  Location: [facility] C[facility] LAB;  Service: Cardiovascular;  Laterality: N/A;  . SMALL INTESTINE SURGERY  09/16/2004   SBO resection, VH repair    Family History  Problem Relation Age of Onset  . Pneumonia Father   . Stroke Brother   . Stroke Sister   . Cancer Brother        type unknown    Social History   Socioeconomic History  . Marital status: Widowed    Spouse name: Not on file  . Number of children: Not on file  . Years of education: Not on file  . Highest education level: Not on file  Occupational History  . Not on file  Social Needs  .  Financial resource strain: Not on file  . Food insecurity    Worry: Not on file    Inability: Not on file  . Transportation needs    Medical: Not on file    Non-medical: Not on file  Tobacco Use  . Smoking status: Former Smoker    Packs/day: 1.50    Years: 15.00    Pack years: 22.50    Quit date: 04/16/1955    Years since quitting: 63.6  . Smokeless tobacco: Former Systems developer    Types: [place] date: 08/17/1952  Substance and Sexual Activity  . Alcohol use: No    Comment: 08/14/2016 "drank beer when I was a teenager"  . Drug use: No  . Sexual activity: Not on file  Lifestyle  . Physical activity    Days per week: Not on file    Minutes per session: Not on file  . Stress: Not on file   Relationships  . Social Herbalist on phone: Not on file    Gets together: Not on file    Attends religious service: Not on file    Active member of club or organization: Not on file    Attends meetings of clubs or organizations: Not on file    Relationship status: Not on file  . Intimate partner violence    Fear of current or ex partner: Not on file    Emotionally abused: Not on file    Physically abused: Not on file    Forced sexual activity: Not on file  Other Topics Concern  . Not on file  Social History Narrative   Lives alone    Outpatient Medications Prior to Visit  Medication Sig Dispense Refill  . ferrous sulfate 325 (65 FE) MG tablet Take 1 tablet (325 mg total) by mouth daily with breakfast. 90 tablet 1  . fluticasone (FLONASE) 50 MCG/ACT nasal spray Place 2 sprays into both nostrils daily. 16 g 6  . losartan-hydrochlorothiazide (HYZAAR) 100-12.5 MG tablet TAKE 1 TABLET BY MOUTH EVERY DAY 30 tablet 3  . megestrol (MEGACE) 400 MG/10ML suspension Take 10 mLs (400 mg total) by mouth daily. 240 mL 5  . metoprolol succinate (TOPROL-XL) 25 MG 24 hr tablet TAKE 1 TABLET(25 MG) BY MOUTH EVERY EVENING 90 tablet 0  . mirtazapine (REMERON) 7.5 MG tablet Take 1 tablet (7.5 mg total) by mouth at bedtime. 30 tablet 0  . pantoprazole (PROTONIX) 40 MG tablet TAKE 1 TABLET(40 MG) BY MOUTH DAILY 90 tablet 1  . XARELTO 15 MG TABS tablet TAKE 1 TABLET(15 MG) BY MOUTH DAILY WITH SUPPER 30 tablet 3  . gabapentin (NEURONTIN) 100 MG capsule Take 1 capsule (100 mg total) by mouth at bedtime. 30 capsule 3   No facility-administered medications prior to visit.     No Known Allergies  ROS Review of Systems  Constitutional: Negative.   HENT: Negative.   Respiratory: Negative.   Cardiovascular: Negative.   Gastrointestinal: Negative.   Endocrine: Negative for polyphagia and polyuria.  Musculoskeletal: Negative for joint swelling and myalgias.  Neurological: Positive for numbness.  Negative for light-headedness.  Hematological: Does not bruise/bleed easily.  Psychiatric/Behavioral: Negative.       Objective:    Physical Exam  Constitutional: He is oriented to person, place, and time. He appears well-developed and well-nourished. No distress.  HENT:  Head: Normocephalic and atraumatic.  Right Ear: External ear normal.  Left Ear: External ear normal.  Eyes: Right eye exhibits no discharge.  Left eye exhibits no discharge. No scleral icterus.  Neck: No JVD present. No tracheal deviation present.  Cardiovascular: Normal rate, regular rhythm and normal heart sounds.  Pulses:      Dorsalis pedis pulses are 1+ on the right side and 1+ on the left side.       Posterior tibial pulses are 1+ on the right side and 1+ on the left side.  Pulmonary/Chest: Effort normal and breath sounds normal. No stridor.  Musculoskeletal:       Feet:  Neurological: He is alert and oriented to person, place, and time.  Skin: Skin is warm and dry. He is not diaphoretic.  Psychiatric: He has a normal mood and affect. His behavior is normal.    BP 130/80   Pulse 77   Ht 5\' 10"  (1.778 m)   Wt 147 lb (66.7 kg)   SpO2 98%   BMI 21.09 kg/m  Wt Readings from Last 3 Encounters:  11/17/18 147 lb (66.7 kg)  06/18/18 156 lb 2 oz (70.8 kg)  04/28/18 155 lb (70.3 kg)   BP Readings from Last 3 Encounters:  11/17/18 130/80  06/18/18 122/76  04/28/18 120/70   Guideline developer:  UpToDate (see UpToDate for funding source) Date Released: June 2014  Health Maintenance Due  Topic Date Due  . FOOT EXAM  02/29/1936  . OPHTHALMOLOGY EXAM  02/29/1936  . TETANUS/TDAP  02/28/1945  . PNA vac Low Risk Adult (1 of 2 - PCV13) 03/01/1991  . HEMOGLOBIN A1C  07/18/2018    There are no preventive care reminders to display for this patient.  Lab Results  Component Value Date   TSH 0.811 04/07/2010   Lab Results  Component Value Date   WBC 6.5 06/18/2018   HGB 11.2 (L) 06/18/2018   HCT 35.0  (L) 06/18/2018   MCV 83.1 06/18/2018   PLT 149.0 (L) 06/18/2018   Lab Results  Component Value Date   NA 139 04/10/2018   K 5.5 (H) 04/10/2018   CO2 25 04/10/2018   GLUCOSE 91 04/10/2018   BUN 15 04/10/2018   CREATININE 1.45 (H) 04/10/2018   BILITOT 0.6 12/05/2017   ALKPHOS 57 12/05/2017   AST 20 12/05/2017   ALT 13 12/05/2017   PROT 6.3 (L) 12/05/2017   ALBUMIN 3.3 (L) 12/05/2017   CALCIUM 8.3 (L) 04/10/2018   ANIONGAP 6 12/05/2017   GFR 54.62 (L) 01/16/2018   Lab Results  Component Value Date   CHOL 122 07/23/2015   Lab Results  Component Value Date   HDL 43 07/23/2015   Lab Results  Component Value Date   LDLCALC 68 07/23/2015   Lab Results  Component Value Date   TRIG 55 07/23/2015   Lab Results  Component Value Date   CHOLHDL 2.8 07/23/2015   Lab Results  Component Value Date   HGBA1C 6.3 01/16/2018      Assessment & Plan:   Problem List Items Addressed This Visit      Endocrine   Diabetes ([place])   Relevant Orders   Hemoglobin A1c     Nervous and Auditory   Neuropathy - Primary   Relevant Medications   gabapentin (NEURONTIN) 100 MG capsule     Other   Microcytic anemia   Iron deficiency anemia   Pain in both feet      Meds ordered this encounter  Medications  . gabapentin (NEURONTIN) 100 MG capsule    Sig: Take 2 capsules (200 mg total) by mouth at bedtime.  Dispense:  60 capsule    Refill:  2    Follow-up: Return in about 3 months (around 02/17/2019).   We will increase Neurontin to 200 mg nightly.  Visiting home health nurse is pending.

## 2018-11-17 NOTE — Addendum Note (Signed)
Addended by: Rodrigo Ran on: 11/17/2018 02:24 PM   Modules accepted: Orders

## 2018-11-18 LAB — VITAMIN B12: Vitamin B-12: 194 pg/mL — ABNORMAL LOW (ref 200–1100)

## 2018-11-18 LAB — BASIC METABOLIC PANEL
BUN/Creatinine Ratio: 13 (calc) (ref 6–22)
BUN: 25 mg/dL (ref 7–25)
CO2: 21 mmol/L (ref 20–32)
Calcium: 8.4 mg/dL — ABNORMAL LOW (ref 8.6–10.3)
Chloride: 112 mmol/L — ABNORMAL HIGH (ref 98–110)
Creat: 1.93 mg/dL — ABNORMAL HIGH (ref 0.70–1.11)
Glucose, Bld: 77 mg/dL (ref 65–99)
Potassium: 4.5 mmol/L (ref 3.5–5.3)
Sodium: 142 mmol/L (ref 135–146)

## 2018-11-18 LAB — CBC
HCT: 32.6 % — ABNORMAL LOW (ref 38.5–50.0)
Hemoglobin: 10.6 g/dL — ABNORMAL LOW (ref 13.2–17.1)
MCH: 26.6 pg — ABNORMAL LOW (ref 27.0–33.0)
MCHC: 32.5 g/dL (ref 32.0–36.0)
MCV: 81.7 fL (ref 80.0–100.0)
MPV: 13.3 fL — ABNORMAL HIGH (ref 7.5–12.5)
Platelets: 132 10*3/uL — ABNORMAL LOW (ref 140–400)
RBC: 3.99 10*6/uL — ABNORMAL LOW (ref 4.20–5.80)
RDW: 16.1 % — ABNORMAL HIGH (ref 11.0–15.0)
WBC: 6.3 10*3/uL (ref 3.8–10.8)

## 2018-11-18 LAB — HEMOGLOBIN A1C
Est. average glucose Bld gHb Est-mCnc: 134 mg/dL
Hgb A1c MFr Bld: 6.3 % — ABNORMAL HIGH (ref 4.8–5.6)

## 2018-11-18 LAB — IRON,TIBC AND FERRITIN PANEL
%SAT: 31 % (calc) (ref 20–48)
Ferritin: 98 ng/mL (ref 24–380)
Iron: 78 ug/dL (ref 50–180)
TIBC: 255 mcg/dL (calc) (ref 250–425)

## 2018-11-19 NOTE — Telephone Encounter (Signed)
This encounter was created in error - please disregard.

## 2018-11-23 ENCOUNTER — Other Ambulatory Visit: Payer: Self-pay | Admitting: Family Medicine

## 2018-11-23 DIAGNOSIS — J301 Allergic rhinitis due to pollen: Secondary | ICD-10-CM

## 2018-11-25 ENCOUNTER — Other Ambulatory Visit: Payer: Self-pay | Admitting: Critical Care Medicine

## 2018-11-25 DIAGNOSIS — Z20822 Contact with and (suspected) exposure to covid-19: Secondary | ICD-10-CM

## 2018-11-26 ENCOUNTER — Telehealth: Payer: Self-pay | Admitting: Family Medicine

## 2018-11-26 DIAGNOSIS — I639 Cerebral infarction, unspecified: Secondary | ICD-10-CM

## 2018-11-26 NOTE — Telephone Encounter (Signed)
Caller/Agency-  Kathlee Nations, with [facility].    Requesting-  PT eval for strengthening   And order for medical social worker   Kathlee Nations also requests a RX for bedside commode for patient to help prevent possible nighttime falls. No preference on medical supply company.   Kathlee Nations contact #  [phone]

## 2018-11-26 NOTE — Telephone Encounter (Signed)
Okay for verbal and okay for bedside commode?

## 2018-11-26 NOTE — Telephone Encounter (Signed)
Okay, thanks

## 2018-11-26 NOTE — Telephone Encounter (Signed)
I left a voicemail for [name] to call the office back.

## 2018-11-27 ENCOUNTER — Telehealth: Payer: Self-pay | Admitting: Family Medicine

## 2018-11-27 NOTE — Telephone Encounter (Signed)
gabapentin (NEURONTIN) 100 MG capsule [[id number]]    Pt granddaughter called and stated that the medication is making him to sleepy. Pt would like a call back from the nurse. Pt feels like he may fall asleep when driving.  Please advise

## 2018-11-27 NOTE — Telephone Encounter (Signed)
Verbal given to Kathlee Nations.

## 2018-11-27 NOTE — Telephone Encounter (Signed)
He can back it off to take one at night instead of 2.

## 2018-11-30 ENCOUNTER — Telehealth: Payer: Self-pay

## 2018-11-30 LAB — NOVEL CORONAVIRUS, NAA: SARS-CoV-2, NAA: NOT DETECTED

## 2018-11-30 NOTE — Telephone Encounter (Signed)
I left pt's granddaughter a voicemail with the information below. I advised her to call back with any questions.

## 2018-11-30 NOTE — Telephone Encounter (Signed)
fyi

## 2018-11-30 NOTE — Telephone Encounter (Signed)
Copied from [place] [id number]. Topic: General - Other >> Nov 27, 2018  3:54 PM Mcneil, Ja-Kwan wrote: Reason for CRM: Laurey Arrow with [facility] stated they were not able to work out a schedule for PT with the pt so the visit will be moved to next week. Cb# [phone]

## 2018-11-30 NOTE — Telephone Encounter (Signed)
Okay 

## 2018-11-30 NOTE — Telephone Encounter (Signed)
Pt's granddaughter [name] stated they will continue taking 100mg  Neurontin.

## 2018-12-10 NOTE — Addendum Note (Signed)
Addended by: Brigitte Pulse on: 12/10/2018 06:12 PM   Modules accepted: Orders

## 2018-12-18 ENCOUNTER — Encounter: Payer: Self-pay | Admitting: Family Medicine

## 2019-01-01 ENCOUNTER — Other Ambulatory Visit: Payer: Self-pay | Admitting: Family Medicine

## 2019-01-01 DIAGNOSIS — I480 Paroxysmal atrial fibrillation: Secondary | ICD-10-CM

## 2019-01-02 ENCOUNTER — Other Ambulatory Visit: Payer: Self-pay | Admitting: Family Medicine

## 2019-01-05 ENCOUNTER — Ambulatory Visit (INDEPENDENT_AMBULATORY_CARE_PROVIDER_SITE_OTHER): Payer: Medicare Other | Admitting: Cardiovascular Disease

## 2019-01-05 ENCOUNTER — Other Ambulatory Visit: Payer: Self-pay

## 2019-01-05 ENCOUNTER — Encounter: Payer: Self-pay | Admitting: Cardiovascular Disease

## 2019-01-05 VITALS — BP 112/74 | HR 71 | Temp 97.3°F | Ht 70.5 in | Wt 149.0 lb

## 2019-01-05 DIAGNOSIS — I10 Essential (primary) hypertension: Secondary | ICD-10-CM

## 2019-01-05 DIAGNOSIS — I739 Peripheral vascular disease, unspecified: Secondary | ICD-10-CM

## 2019-01-05 DIAGNOSIS — I482 Chronic atrial fibrillation, unspecified: Secondary | ICD-10-CM

## 2019-01-05 NOTE — Patient Instructions (Signed)
Medication Instructions:  Your physician recommends that you continue on your current medications as directed. Please refer to the Current Medication list given to you today.  If you need a refill on your cardiac medications before your next appointment, please call your pharmacy.   Lab work: None ordered If you have labs (blood work) drawn today and your tests are completely normal, you will receive your results only by: MyChart Message (if you have MyChart) OR A paper copy in the mail If you have any lab test that is abnormal or we need to change your treatment, we will call you to review the results.  Testing/Procedures: None ordered  Follow-Up: At [facility], you and your health needs are our priority.  As part of our continuing mission to provide you with exceptional heart care, we have created designated Provider Care Teams.  These Care Teams include your primary Cardiologist (physician) and Advanced Practice Providers (APPs -  Physician Assistants and Nurse Practitioners) who all work together to provide you with the care you need, when you need it. You will need a follow up appointment in 12 months.  Please call our office 2 months in advance to schedule this appointment.  You may see Muhammad Arida, MD or one of the following Advanced Practice Providers on your designated Care Team:   Luke Kilroy, PA-C Krista Kroeger, PA-C Callie Goodrich, PA-C        

## 2019-01-05 NOTE — Progress Notes (Signed)
Cardiology Office Note   Date:  01/05/2019   ID:  [name], DOB 1924/03/03, MRN [id number]  PCP:  Matthew Maw, MD  Cardiologist:  Dr. Lovena Craig   No chief complaint on file.     History of Present Illness: [name] is a 85 y.o. male who is here today for a follow-up visit regarding peripheral arterial disease.The patient has known history of chronic atrial fibrillation on long-term anticoagulation with Xarelto, symptomatic bradycardia status post permanent pacemaker placement, previous stroke one day after running out of Xarelto and diet-controlled diabetes. He was seen in 2018 for ulceration on the left distal fourth toe with painful dark discoloration.  Vascular studies showed normal ABI on the right side and mildly reduced on the left side at 0.93. Duplex showed occlusion of the distal left popliteal artery and tibial peroneal trunk. Angiography  showed no significant aortoiliac disease: Occluded left popliteal artery with reconstitution in the distal TP trunk with patent posterior tibial and peroneal arteries. Attempted antegrade angioplasty of the left popliteal artery was not successful due to inability to cross the occlusion intraluminally. The patient was brought back  and underwent successful complex endovascular intervention of the occluded left popliteal artery and TP trunk using a retrograde access via the left posterior tibial artery. He has done well since then with no recurrent claudication or ulceration. Most recent vascular studies in June showed normal ABI bilaterally.  Duplex showed patent stent in the left popliteal and tibioperoneal trunk with no obstructive disease. He complains of pain affecting the left small toe especially at night.  There is no ulceration.  Past Medical History:  Diagnosis Date  . Arthritis   . Atrial fibrillation ([name])   . CKD (chronic kidney disease)   . History of hiatal hernia   . Hx SBO    Last 2013 all treated  conservatively  . Hypertension   . Presence of permanent cardiac pacemaker   . Prostate cancer ([name])   . Stroke [name]) 2017   left facial drooping noted 08/14/2016  . Tachycardia-bradycardia syndrome ([name])   . Type II diabetes mellitus ([name])   . Ventral hernia    x3    Past Surgical History:  Procedure Laterality Date  . ABDOMINAL AORTOGRAM W/LOWER EXTREMITY N/A 08/07/2016   Procedure: Abdominal Aortogram w/Lower Extremity;  Surgeon: Wellington Hampshire, MD;  Location: [place] [facility];  Service: Cardiovascular;  Laterality: N/A;  . CHOLECYSTECTOMY OPEN  03/31/2001   Dr Lindon Romp  . COLON SURGERY    . EXPLORATORY LAPAROTOMY  08/2004   Archie Endo 10/01/2010  . HEMORRHOID SURGERY    . HERNIA REPAIR    . HIATAL HERNIA REPAIR  2002  . INCISIONAL HERNIA REPAIR  08/2004   Archie Endo 10/01/2010  . INSERT / REPLACE / REMOVE PACEMAKER    . PERIPHERAL VASCULAR BALLOON ANGIOPLASTY  08/07/2016   Procedure: Peripheral Vascular Balloon Angioplasty;  Surgeon: Wellington Hampshire, MD;  Location: [place] [facility];  Service: Cardiovascular;;  left popliteal artery  . PERIPHERAL VASCULAR INTERVENTION  08/14/2016   popliteal artery/notes 08/14/2016  . PERIPHERAL VASCULAR INTERVENTION Left 08/14/2016   Procedure: Peripheral Vascular Intervention;  Surgeon: Wellington Hampshire, MD;  Location: [place] [facility];  Service: Cardiovascular;  Laterality: Left;  popliteal artery  . PERMANENT PACEMAKER GENERATOR CHANGE N/A 04/06/2012   Procedure: PERMANENT PACEMAKER GENERATOR CHANGE;  Surgeon: Evans Lance, MD;  Location: [facility] CATH LAB;  Service: Cardiovascular;  Laterality: N/A;  . SMALL INTESTINE  SURGERY  09/16/2004   SBO resection, VH repair     Current Outpatient Medications  Medication Sig Dispense Refill  . ferrous sulfate 325 (65 FE) MG tablet Take 1 tablet (325 mg total) by mouth daily with breakfast. 90 tablet 1  . fluticasone (FLONASE) 50 MCG/ACT nasal spray SHAKE LIQUID AND USE 2 SPRAYS IN EACH NOSTRIL DAILY 16 g 6   . gabapentin (NEURONTIN) 100 MG capsule Take 2 capsules (200 mg total) by mouth at bedtime. 60 capsule 2  . losartan-hydrochlorothiazide (HYZAAR) 100-12.5 MG tablet TAKE 1 TABLET BY MOUTH DAILY 90 tablet 1  . megestrol (MEGACE) 400 MG/10ML suspension Take 10 mLs (400 mg total) by mouth daily. 240 mL 5  . metoprolol succinate (TOPROL-XL) 25 MG 24 hr tablet TAKE 1 TABLET(25 MG) BY MOUTH EVERY EVENING 90 tablet 1  . mirtazapine (REMERON) 7.5 MG tablet Take 1 tablet (7.5 mg total) by mouth at bedtime. 30 tablet 0  . pantoprazole (PROTONIX) 40 MG tablet TAKE 1 TABLET(40 MG) BY MOUTH DAILY 90 tablet 1  . XARELTO 15 MG TABS tablet TAKE 1 TABLET(15 MG) BY MOUTH DAILY WITH SUPPER 30 tablet 3   No current facility-administered medications for this visit.     Allergies:   Patient has no known allergies.    Social History:  The patient  reports that he quit smoking about 63 years ago. He has a 22.50 pack-year smoking history. He quit smokeless tobacco use about 66 years ago.  His smokeless tobacco use included chew. He reports that he does not drink alcohol or use drugs.   Family History:  The patient's family history includes Cancer in his brother; Pneumonia in his father; Stroke in his brother and sister.    ROS:  Please see the history of present illness.   Otherwise, review of systems are positive for none.   All other systems are reviewed and negative.    PHYSICAL EXAM: VS:  BP 112/74 (BP Location: Left Arm, Patient Position: Sitting, Cuff Size: Normal)   Pulse 71   Temp (!) 97.3 F (36.3 C)   Ht 5' 10.5" (1.791 m)   Wt 149 lb (67.6 kg)   BMI 21.08 kg/m  , BMI Body mass index is 21.08 kg/m. GEN: Well nourished, well developed, in no acute distress  HEENT: normal  Neck: no JVD, carotid bruits, or masses Cardiac: RRR; no murmurs, rubs, or gallops,no edema  Respiratory:  clear to auscultation bilaterally, normal work of breathing GI: soft, nontender, nondistended, + BS MS: no deformity  or atrophy  Skin: warm and dry, no rash Neuro:  Strength and sensation are intact Psych: euthymic mood, full affect Vascular:   Posterior tibial pulse is palpable on the left side.  No ulceration.  EKG:  EKG is  ordered today. EKG showed ventricular paced rhythm with possible underlying atrial fibrillation.   Recent Labs: 11/17/2018: BUN 25; Creat 1.93; Hemoglobin 10.6; Platelets 132; Potassium 4.5; Sodium 142    Lipid Panel    Component Value Date/Time   CHOL 122 07/23/2015 1254   TRIG 55 07/23/2015 1254   HDL 43 07/23/2015 1254   CHOLHDL 2.8 07/23/2015 1254   VLDL 11 07/23/2015 1254   LDLCALC 68 07/23/2015 1254      Wt Readings from Last 3 Encounters:  01/05/19 149 lb (67.6 kg)  11/17/18 147 lb (66.7 kg)  06/18/18 156 lb 2 oz (70.8 kg)       No flowsheet data found.    ASSESSMENT AND PLAN:  1.  Peripheral arterial disease with previous critical limb ischemia status post successful complex endovascular intervention of the occluded left popliteal artery and TP trunk with placement of 2 stents.  He continues to have palpable posterior tibial pulse on the left side and duplex showed patent stents with normal ABI.  Pain in the lateral side of the left small toe does not seem to be vascular in origin.  2. Chronic atrial fibrillation: His rate is controlled and he is on long-term anticoagulation with Xarelto.  3.  Essential hypertension: Blood pressures controlled on current medications.   Disposition:   FU with me in 12 months  Signed,  Matthew Sacramento, MD  01/05/2019 5:46 PM    [place]

## 2019-01-19 ENCOUNTER — Ambulatory Visit: Payer: Medicare Other | Admitting: Cardiovascular Disease

## 2019-03-01 ENCOUNTER — Other Ambulatory Visit: Payer: Self-pay

## 2019-03-01 MED ORDER — LOSARTAN POTASSIUM-HCTZ 100-12.5 MG PO TABS: 1.0000 | ORAL_TABLET | Freq: Every day | ORAL | 2 refills | Status: DC

## 2019-03-09 ENCOUNTER — Ambulatory Visit: Payer: Medicare Other | Admitting: Internal Medicine

## 2019-03-09 ENCOUNTER — Encounter: Payer: Self-pay | Admitting: Internal Medicine

## 2019-03-09 ENCOUNTER — Other Ambulatory Visit: Payer: Self-pay

## 2019-03-09 VITALS — BP 106/68 | HR 85 | Ht 70.5 in | Wt 147.0 lb

## 2019-03-09 DIAGNOSIS — I5032 Chronic diastolic (congestive) heart failure: Secondary | ICD-10-CM

## 2019-03-09 DIAGNOSIS — Z950 Presence of cardiac pacemaker: Secondary | ICD-10-CM | POA: Diagnosis not present

## 2019-03-09 DIAGNOSIS — I482 Chronic atrial fibrillation, unspecified: Secondary | ICD-10-CM | POA: Diagnosis not present

## 2019-03-09 DIAGNOSIS — I5033 Acute on chronic diastolic (congestive) heart failure: Secondary | ICD-10-CM | POA: Insufficient documentation

## 2019-03-09 DIAGNOSIS — I495 Sick sinus syndrome: Secondary | ICD-10-CM | POA: Diagnosis not present

## 2019-03-09 NOTE — Progress Notes (Signed)
HPI [name] returns today for followup. He is a pleasant 85 yo man with CHB, s/p PPM insertion. He has a remote stroke. He has some residual. He denies chest pain or sob. No syncope. His bp has been controlled.  No Known Allergies   Current Outpatient Medications  Medication Sig Dispense Refill  . ferrous sulfate 325 (65 FE) MG tablet Take 1 tablet (325 mg total) by mouth daily with breakfast. 90 tablet 1  . fluticasone (FLONASE) 50 MCG/ACT nasal spray SHAKE LIQUID AND USE 2 SPRAYS IN EACH NOSTRIL DAILY 16 g 6  . gabapentin (NEURONTIN) 100 MG capsule Take 2 capsules (200 mg total) by mouth at bedtime. 60 capsule 2  . losartan-hydrochlorothiazide (HYZAAR) 100-12.5 MG tablet Take 1 tablet by mouth daily. 90 tablet 2  . megestrol (MEGACE) 400 MG/10ML suspension Take 10 mLs (400 mg total) by mouth daily. 240 mL 5  . metoprolol succinate (TOPROL-XL) 25 MG 24 hr tablet TAKE 1 TABLET(25 MG) BY MOUTH EVERY EVENING 90 tablet 1  . mirtazapine (REMERON) 7.5 MG tablet Take 1 tablet (7.5 mg total) by mouth at bedtime. 30 tablet 0  . pantoprazole (PROTONIX) 40 MG tablet TAKE 1 TABLET(40 MG) BY MOUTH DAILY 90 tablet 1  . XARELTO 15 MG TABS tablet TAKE 1 TABLET(15 MG) BY MOUTH DAILY WITH SUPPER 30 tablet 3   No current facility-administered medications for this visit.      Past Medical History:  Diagnosis Date  . Arthritis   . Atrial fibrillation ([place])   . CKD (chronic kidney disease)   . History of hiatal hernia   . Hx SBO    Last 2013 all treated conservatively  . Hypertension   . Presence of permanent cardiac pacemaker   . Prostate cancer ([place])   . Stroke [facility]) 2017   left facial drooping noted 08/14/2016  . Tachycardia-bradycardia syndrome ([place])   . Type II diabetes mellitus ([place])   . Ventral hernia    x3    ROS:   All systems reviewed and negative except as noted in the HPI.   Past Surgical History:  Procedure Laterality Date  . ABDOMINAL AORTOGRAM W/LOWER EXTREMITY  N/A 08/07/2016   Procedure: Abdominal Aortogram w/Lower Extremity;  Surgeon: Matthew Hampshire, MD;  Location: [place] [facility];  Service: Cardiovascular;  Laterality: N/A;  . CHOLECYSTECTOMY OPEN  03/31/2001   Dr [name]  . COLON SURGERY    . EXPLORATORY LAPAROTOMY  08/2004   [name] 10/01/2010  . HEMORRHOID SURGERY    . HERNIA REPAIR    . HIATAL HERNIA REPAIR  2002  . INCISIONAL HERNIA REPAIR  08/2004   [name] 10/01/2010  . INSERT / REPLACE / REMOVE PACEMAKER    . PERIPHERAL VASCULAR BALLOON ANGIOPLASTY  08/07/2016   Procedure: Peripheral Vascular Balloon Angioplasty;  Surgeon: Matthew Hampshire, MD;  Location: [place] [facility];  Service: Cardiovascular;;  left popliteal artery  . PERIPHERAL VASCULAR INTERVENTION  08/14/2016   popliteal artery/notes 08/14/2016  . PERIPHERAL VASCULAR INTERVENTION Left 08/14/2016   Procedure: Peripheral Vascular Intervention;  Surgeon: Matthew Hampshire, MD;  Location: [place] [facility];  Service: Cardiovascular;  Laterality: Left;  popliteal artery  . PERMANENT PACEMAKER GENERATOR CHANGE N/A 04/06/2012   Procedure: PERMANENT PACEMAKER GENERATOR CHANGE;  Surgeon: Matthew Lance, MD;  Location: [facility] CATH LAB;  Service: Cardiovascular;  Laterality: N/A;  . SMALL INTESTINE SURGERY  09/16/2004   SBO resection, VH repair     Family History  Problem Relation Age of Onset  . Pneumonia Father   . Stroke Brother   . Stroke Sister   . Cancer Brother        type unknown     Social History   Socioeconomic History  . Marital status: Widowed    Spouse name: Not on file  . Number of children: Not on file  . Years of education: Not on file  . Highest education level: Not on file  Occupational History  . Not on file  Social Needs  . Financial resource strain: Not on file  . Food insecurity    Worry: Not on file    Inability: Not on file  . Transportation needs    Medical: Not on file    Non-medical: Not on file  Tobacco Use  . Smoking status: Former  Smoker    Packs/day: 1.50    Years: 15.00    Pack years: 22.50    Quit date: 04/16/1955    Years since quitting: 63.9  . Smokeless tobacco: Former Systems developer    Types: [place] date: 08/17/1952  Substance and Sexual Activity  . Alcohol use: No    Comment: 08/14/2016 "drank beer when I was a teenager"  . Drug use: No  . Sexual activity: Not on file  Lifestyle  . Physical activity    Days per week: Not on file    Minutes per session: Not on file  . Stress: Not on file  Relationships  . Social Herbalist on phone: Not on file    Gets together: Not on file    Attends religious service: Not on file    Active member of club or organization: Not on file    Attends meetings of clubs or organizations: Not on file    Relationship status: Not on file  . Intimate partner violence    Fear of current or ex partner: Not on file    Emotionally abused: Not on file    Physically abused: Not on file    Forced sexual activity: Not on file  Other Topics Concern  . Not on file  Social History Narrative   Lives alone     BP 106/68   Pulse 85   Ht 5' 10.5" (1.791 m)   Wt 147 lb (66.7 kg)   SpO2 98%   BMI 20.79 kg/m   Physical Exam:  Well appearing NAD HEENT: Unremarkable Neck:  No JVD, no thyromegally Lymphatics:  No adenopathy Back:  No CVA tenderness Lungs:  Clear with no wheezes HEART:  Regular rate rhythm, no murmurs, no rubs, no clicks Abd:  soft, positive bowel sounds, no organomegally, no rebound, no guarding Ext:  2 plus pulses, no edema, no cyanosis, no clubbing Skin:  No rashes no nodules Neuro:  CN II through XII intact, motor grossly intact  DEVICE  Normal device function.  See PaceArt for details.   Assess/Plan: 1. CHB - he is conduction at times and is pacing about a 1/3 of the time.  2. Atrial fib - his VR is controlled. He is asymptomatic and is tolerating his low dose xarelto. 3. Weigh loss - since his last visit he has lost 7 lbs. I encouraged him to  eat more high calorie foods.  4. HTN - his SBP is well controlled. He will continue his current meds.  [name].D.

## 2019-03-09 NOTE — Patient Instructions (Signed)

## 2019-03-12 ENCOUNTER — Other Ambulatory Visit: Payer: Self-pay | Admitting: Family Medicine

## 2019-05-06 ENCOUNTER — Other Ambulatory Visit: Payer: Self-pay | Admitting: Family Medicine

## 2019-05-06 DIAGNOSIS — D509 Iron deficiency anemia, unspecified: Secondary | ICD-10-CM

## 2019-05-06 MED ORDER — FERROUS SULFATE 325 (65 FE) MG PO TABS: 325.0000 mg | ORAL_TABLET | Freq: Every day | ORAL | 0 refills | Status: DC

## 2019-06-24 DIAGNOSIS — I739 Peripheral vascular disease, unspecified: Secondary | ICD-10-CM | POA: Diagnosis not present

## 2019-06-28 ENCOUNTER — Other Ambulatory Visit: Payer: Self-pay | Admitting: Family Medicine

## 2019-06-28 DIAGNOSIS — I480 Paroxysmal atrial fibrillation: Secondary | ICD-10-CM

## 2019-07-15 ENCOUNTER — Ambulatory Visit (INDEPENDENT_AMBULATORY_CARE_PROVIDER_SITE_OTHER): Payer: Medicare PPO | Admitting: *Deleted

## 2019-07-15 DIAGNOSIS — I495 Sick sinus syndrome: Secondary | ICD-10-CM

## 2019-07-15 LAB — CUP PACEART REMOTE DEVICE CHECK
Battery Impedance: 3267 Ohm
Battery Remaining Longevity: 21 mo
Battery Voltage: 2.71 V
Brady Statistic RV Percent Paced: 41 %
Date Time Interrogation Session: 20210224184724
Implantable Lead Implant Date: 20060510
Implantable Lead Location: 753860
Implantable Lead Model: 5076
Implantable Pulse Generator Implant Date: 20131118
Lead Channel Impedance Value: 0 Ohm
Lead Channel Impedance Value: 457 Ohm
Lead Channel Pacing Threshold Amplitude: 1 V
Lead Channel Pacing Threshold Pulse Width: 0.4 ms
Lead Channel Sensing Intrinsic Amplitude: 16 mV
Lead Channel Setting Pacing Amplitude: 2.5 V
Lead Channel Setting Pacing Pulse Width: 0.4 ms
Lead Channel Setting Sensing Sensitivity: 5.6 mV

## 2019-07-15 NOTE — Progress Notes (Signed)
PPM Remote  

## 2019-07-19 ENCOUNTER — Ambulatory Visit: Payer: Medicare PPO

## 2019-07-22 ENCOUNTER — Other Ambulatory Visit: Payer: Self-pay | Admitting: Family Medicine

## 2019-07-22 DIAGNOSIS — I480 Paroxysmal atrial fibrillation: Secondary | ICD-10-CM

## 2019-08-28 ENCOUNTER — Other Ambulatory Visit: Payer: Self-pay | Admitting: Family Medicine

## 2019-08-28 DIAGNOSIS — D509 Iron deficiency anemia, unspecified: Secondary | ICD-10-CM

## 2019-10-29 ENCOUNTER — Ambulatory Visit (INDEPENDENT_AMBULATORY_CARE_PROVIDER_SITE_OTHER): Payer: Medicare Other | Admitting: *Deleted

## 2019-10-29 DIAGNOSIS — I495 Sick sinus syndrome: Secondary | ICD-10-CM | POA: Diagnosis not present

## 2019-10-29 LAB — CUP PACEART REMOTE DEVICE CHECK
Battery Impedance: 3636 Ohm
Battery Remaining Longevity: 17 mo
Battery Voltage: 2.71 V
Brady Statistic RV Percent Paced: 41 %
Date Time Interrogation Session: 20210610192325
Implantable Lead Implant Date: 20060510
Implantable Lead Location: 753860
Implantable Lead Model: 5076
Implantable Pulse Generator Implant Date: 20131118
Lead Channel Impedance Value: 0 Ohm
Lead Channel Impedance Value: 447 Ohm
Lead Channel Pacing Threshold Amplitude: 1 V
Lead Channel Pacing Threshold Pulse Width: 0.4 ms
Lead Channel Setting Pacing Amplitude: 2.5 V
Lead Channel Setting Pacing Pulse Width: 0.4 ms
Lead Channel Setting Sensing Sensitivity: 5.6 mV

## 2019-10-29 NOTE — Progress Notes (Signed)
Remote pacemaker transmission.   

## 2019-11-08 ENCOUNTER — Encounter (HOSPITAL_COMMUNITY): Payer: Medicare PPO

## 2019-11-17 ENCOUNTER — Ambulatory Visit (HOSPITAL_COMMUNITY)
Admission: RE | Admit: 2019-11-17 | Discharge: 2019-11-17 | Disposition: A | Discharge status: Home / Self Care | Payer: Medicare Other | Source: Ambulatory Visit | Attending: Cardiovascular Disease | Admitting: Cardiovascular Disease

## 2019-11-17 ENCOUNTER — Other Ambulatory Visit (HOSPITAL_COMMUNITY): Payer: Self-pay | Admitting: Cardiovascular Disease

## 2019-11-17 ENCOUNTER — Other Ambulatory Visit: Payer: Self-pay

## 2019-11-17 DIAGNOSIS — Z95820 Peripheral vascular angioplasty status with implants and grafts: Secondary | ICD-10-CM | POA: Diagnosis present

## 2019-11-17 DIAGNOSIS — I739 Peripheral vascular disease, unspecified: Secondary | ICD-10-CM | POA: Diagnosis not present

## 2019-12-06 ENCOUNTER — Other Ambulatory Visit: Payer: Self-pay | Admitting: Family Medicine

## 2019-12-11 ENCOUNTER — Other Ambulatory Visit: Payer: Self-pay | Admitting: Family Medicine

## 2019-12-11 DIAGNOSIS — I480 Paroxysmal atrial fibrillation: Secondary | ICD-10-CM

## 2019-12-11 DIAGNOSIS — D509 Iron deficiency anemia, unspecified: Secondary | ICD-10-CM

## 2019-12-21 ENCOUNTER — Telehealth: Payer: Self-pay | Admitting: Family Medicine

## 2019-12-21 ENCOUNTER — Other Ambulatory Visit: Payer: Self-pay | Admitting: Family Medicine

## 2019-12-21 DIAGNOSIS — I480 Paroxysmal atrial fibrillation: Secondary | ICD-10-CM

## 2019-12-21 NOTE — Telephone Encounter (Signed)
Patient needs refill of Xarelto. Please send to same [facility] on [address].

## 2019-12-21 NOTE — Telephone Encounter (Signed)
Rx sent in patient aware 

## 2020-01-04 ENCOUNTER — Ambulatory Visit: Payer: Medicare HMO | Admitting: Cardiovascular Disease

## 2020-01-09 ENCOUNTER — Other Ambulatory Visit: Payer: Self-pay | Admitting: Family Medicine

## 2020-01-09 DIAGNOSIS — D509 Iron deficiency anemia, unspecified: Secondary | ICD-10-CM

## 2020-01-18 ENCOUNTER — Ambulatory Visit (INDEPENDENT_AMBULATORY_CARE_PROVIDER_SITE_OTHER): Payer: Medicare HMO | Admitting: Cardiovascular Disease

## 2020-01-18 ENCOUNTER — Encounter: Payer: Self-pay | Admitting: Cardiovascular Disease

## 2020-01-18 ENCOUNTER — Other Ambulatory Visit: Payer: Self-pay

## 2020-01-18 VITALS — BP 92/50 | HR 101 | Ht 70.5 in | Wt 145.0 lb

## 2020-01-18 DIAGNOSIS — I739 Peripheral vascular disease, unspecified: Secondary | ICD-10-CM | POA: Diagnosis not present

## 2020-01-18 DIAGNOSIS — I10 Essential (primary) hypertension: Secondary | ICD-10-CM

## 2020-01-18 DIAGNOSIS — I482 Chronic atrial fibrillation, unspecified: Secondary | ICD-10-CM

## 2020-01-18 MED ORDER — LOSARTAN POTASSIUM 100 MG PO TABS
100.0000 mg | ORAL_TABLET | Freq: Every day | ORAL | 3 refills | Status: DC
Start: 2020-01-18 — End: 2020-05-17

## 2020-01-18 NOTE — Patient Instructions (Signed)
Medication Instructions:  STOP LOSARTAN-HCT  START LOSARTAN 100 MG DAILY   *If you need a refill on your cardiac medications before your next appointment, please call your pharmacy*  Lab Work: NONE  Testing/Procedures: NONE  Follow-Up: At [facility], you and your health needs are our priority.  As part of our continuing mission to provide you with exceptional heart care, we have created designated Provider Care Teams.  These Care Teams include your primary Cardiologist (physician) and Advanced Practice Providers (APPs -  Physician Assistants and Nurse Practitioners) who all work together to provide you with the care you need, when you need it.  We recommend signing up for the patient portal called "MyChart".  Sign up information is provided on this After Visit Summary.  MyChart is used to connect with patients for Virtual Visits (Telemedicine).  Patients are able to view lab/test results, encounter notes, upcoming appointments, etc.  Non-urgent messages can be sent to your provider as well.   To learn more about what you can do with MyChart, go to [email or web address].    Your next appointment:   6 month(s)  The format for your next appointment:   In Person  Provider:   You may see DR Fletcher Anon or one of the following Advanced Practice Providers on your designated Care Team:    Kerin Ransom, PA-C  [place], [place]  Coletta Memos, [name]

## 2020-01-18 NOTE — Progress Notes (Signed)
Cardiology Office Note   Date:  01/18/2020   ID:  [name], DOB [birth year], MRN [id number]  PCP:  [name], MD  Cardiologist:  Dr. Lovena Le   No chief complaint on file.     History of Present Illness: [name] is a 86 y.o. male who is here today for a follow-up visit regarding peripheral arterial disease.The patient has known history of chronic atrial fibrillation on long-term anticoagulation with Xarelto, symptomatic bradycardia status post permanent pacemaker placement, previous stroke one day after running out of Xarelto and diet-controlled diabetes. He was seen in 2018 for ulceration on the left distal fourth toe with painful dark discoloration.  Vascular studies showed normal ABI on the right side and mildly reduced on the left side at 0.93. Duplex showed occlusion of the distal left popliteal artery and tibial peroneal trunk. Angiography  showed no significant aortoiliac disease: Occluded left popliteal artery with reconstitution in the distal TP trunk with patent posterior tibial and peroneal arteries. The patient  underwent successful complex endovascular intervention of the occluded left popliteal artery and TP trunk using a retrograde access via the left posterior tibial artery.  Most recent vascular studies in July of this year showed normal ABI bilaterally.  Duplex showed patent stent in the left popliteal and tibioperoneal trunk with no obstructive disease.  He complains of numbness and cold sensation affecting both feet mostly at night. Gets better as he walks. No chest pain or shortness of breath. Blood pressure is low but he denies dizziness. He does report poor appetite and weight loss.  Past Medical History:  Diagnosis Date   Arthritis    Atrial fibrillation ([place])    CKD (chronic kidney disease)    History of hiatal hernia    Hx SBO    Last 2013 all treated conservatively   Hypertension    Presence of permanent cardiac  pacemaker    Prostate cancer [facility])    Stroke ([place]) 2017   left facial drooping noted 08/14/2016   Tachycardia-bradycardia syndrome ([place])    Type II diabetes mellitus ([place])    Ventral hernia    x3    Past Surgical History:  Procedure Laterality Date   ABDOMINAL AORTOGRAM W/LOWER EXTREMITY N/A 08/07/2016   Procedure: Abdominal Aortogram w/Lower Extremity;  Surgeon: Wellington Hampshire, MD;  Location: [place] [facility];  Service: Cardiovascular;  Laterality: N/A;   CHOLECYSTECTOMY OPEN  03/31/2001   Dr Lindon Romp   COLON SURGERY     EXPLORATORY LAPAROTOMY  08/2004   Archie Endo 10/01/2010   HEMORRHOID SURGERY     HERNIA REPAIR     HIATAL HERNIA REPAIR  2002   INCISIONAL HERNIA REPAIR  08/2004   Archie Endo 10/01/2010   INSERT / REPLACE / REMOVE PACEMAKER     PERIPHERAL VASCULAR BALLOON ANGIOPLASTY  08/07/2016   Procedure: Peripheral Vascular Balloon Angioplasty;  Surgeon: Wellington Hampshire, MD;  Location: [place] [facility];  Service: Cardiovascular;;  left popliteal artery   PERIPHERAL VASCULAR INTERVENTION  08/14/2016   popliteal artery/notes 08/14/2016   PERIPHERAL VASCULAR INTERVENTION Left 08/14/2016   Procedure: Peripheral Vascular Intervention;  Surgeon: Wellington Hampshire, MD;  Location: [place] [facility];  Service: Cardiovascular;  Laterality: Left;  popliteal artery   PERMANENT PACEMAKER GENERATOR CHANGE N/A 04/06/2012   Procedure: PERMANENT PACEMAKER GENERATOR CHANGE;  Surgeon: Evans Lance, MD;  Location: [facility] CATH LAB;  Service: Cardiovascular;  Laterality: N/A;   SMALL INTESTINE SURGERY  09/16/2004   SBO  resection, VH repair     Current Outpatient Medications  Medication Sig Dispense Refill   FEROSUL 325 (65 Fe) MG tablet TAKE 1 TABLET(325 MG) BY MOUTH DAILY WITH BREAKFAST 90 tablet 0   fluticasone (FLONASE) 50 MCG/ACT nasal spray SHAKE LIQUID AND USE 2 SPRAYS IN EACH NOSTRIL DAILY 16 g 6   gabapentin (NEURONTIN) 100 MG capsule Take 2 capsules (200 mg total) by mouth  at bedtime. 60 capsule 2   losartan-hydrochlorothiazide (HYZAAR) 100-12.5 MG tablet Take 1 tablet by mouth daily. 90 tablet 2   megestrol (MEGACE) 400 MG/10ML suspension Take 10 mLs (400 mg total) by mouth daily. 240 mL 5   metoprolol succinate (TOPROL-XL) 25 MG 24 hr tablet TAKE 1 TABLET(25 MG) BY MOUTH EVERY EVENING 90 tablet 1   mirtazapine (REMERON) 7.5 MG tablet Take 1 tablet (7.5 mg total) by mouth at bedtime. 30 tablet 0   pantoprazole (PROTONIX) 40 MG tablet TAKE 1 TABLET(40 MG) BY MOUTH DAILY 90 tablet 1   XARELTO 15 MG TABS tablet TAKE 1 TABLET(15 MG) BY MOUTH DAILY WITH SUPPER 30 tablet 3   No current facility-administered medications for this visit.    Allergies:   Patient has no known allergies.    Social History:  The patient  reports that he quit smoking about 64 years ago. He has a 22.50 pack-year smoking history. He quit smokeless tobacco use about 67 years ago.  His smokeless tobacco use included chew. He reports that he does not drink alcohol and does not use drugs.   Family History:  The patient's family history includes Cancer in his brother; Pneumonia in his father; Stroke in his brother and sister.    ROS:  Please see the history of present illness.   Otherwise, review of systems are positive for none.   All other systems are reviewed and negative.    PHYSICAL EXAM: VS:  BP (!) 92/50    Pulse (!) 101    Ht 5' 10.5" (1.791 m)    Wt 145 lb (65.8 kg)    SpO2 99%    BMI 20.51 kg/m  , BMI Body mass index is 20.51 kg/m. GEN: Well nourished, well developed, in no acute distress  HEENT: normal  Neck: no JVD, carotid bruits, or masses Cardiac: Irregularly irregular; no murmurs, rubs, or gallops,no edema  Respiratory:  clear to auscultation bilaterally, normal work of breathing GI: soft, nontender, nondistended, + BS MS: no deformity or atrophy  Skin: warm and dry, no rash Neuro:  Strength and sensation are intact Psych: euthymic mood, full affect Vascular:  Posterior tibial pulses +2 bilaterally  EKG:  EKG is  ordered today. EKG showed atrial fibrillation with ventricular rate of 101 bpm.   Recent Labs: No results found for requested labs within last 8760 hours.    Lipid Panel    Component Value Date/Time   CHOL 122 07/23/2015 1254   TRIG 55 07/23/2015 1254   HDL 43 07/23/2015 1254   CHOLHDL 2.8 07/23/2015 1254   VLDL 11 07/23/2015 1254   LDLCALC 68 07/23/2015 1254      Wt Readings from Last 3 Encounters:  01/18/20 145 lb (65.8 kg)  03/09/19 147 lb (66.7 kg)  01/05/19 149 lb (67.6 kg)       No flowsheet data found.    ASSESSMENT AND PLAN:  1.  Peripheral arterial disease with previous critical limb ischemia status post successful complex endovascular intervention of the occluded left popliteal artery and TP trunk with placement of  2 stents.  He continues to have palpable posterior tibial pulse on the left side and duplex showed patent stents with normal ABI. Continue medical therapy.  2. Chronic atrial fibrillation: His rate is controlled and he is on long-term anticoagulation with Xarelto.  3.  Essential hypertension: Blood pressures is low. I rechecked his blood pressure and it was 100/58. Its possible that he does not require as many antihypertensive medication given his poor appetite. I discontinued hydrochlorothiazide. We could consider decreasing losartan further and increasing Toprol for better rate control in the near future.  4. Peripheral neuropathy: His complaints of foot numbness and cold feeling is likely due to peripheral neuropathy. He is on gabapentin.   Disposition:   FU with me in 6 months  Signed,  Kathlyn Sacramento, MD  01/18/2020 10:27 AM    [place]

## 2020-01-20 NOTE — Telephone Encounter (Signed)
Appt scheduled

## 2020-01-26 ENCOUNTER — Other Ambulatory Visit: Payer: Self-pay

## 2020-01-27 ENCOUNTER — Encounter: Payer: Self-pay | Admitting: Family Medicine

## 2020-01-27 ENCOUNTER — Ambulatory Visit (INDEPENDENT_AMBULATORY_CARE_PROVIDER_SITE_OTHER): Payer: Medicare HMO | Admitting: Family Medicine

## 2020-01-27 VITALS — BP 116/67 | HR 105 | Temp 97.5°F | Ht 70.0 in | Wt 147.2 lb

## 2020-01-27 DIAGNOSIS — N189 Chronic kidney disease, unspecified: Secondary | ICD-10-CM | POA: Diagnosis not present

## 2020-01-27 DIAGNOSIS — E538 Deficiency of other specified B group vitamins: Secondary | ICD-10-CM | POA: Insufficient documentation

## 2020-01-27 DIAGNOSIS — F5104 Psychophysiologic insomnia: Secondary | ICD-10-CM

## 2020-01-27 DIAGNOSIS — E119 Type 2 diabetes mellitus without complications: Secondary | ICD-10-CM

## 2020-01-27 DIAGNOSIS — D509 Iron deficiency anemia, unspecified: Secondary | ICD-10-CM | POA: Diagnosis not present

## 2020-01-27 DIAGNOSIS — I4891 Unspecified atrial fibrillation: Secondary | ICD-10-CM | POA: Diagnosis not present

## 2020-01-27 DIAGNOSIS — E1121 Type 2 diabetes mellitus with diabetic nephropathy: Secondary | ICD-10-CM | POA: Diagnosis not present

## 2020-01-27 LAB — CBC
HCT: 33.6 % — ABNORMAL LOW (ref 39.0–52.0)
Hemoglobin: 10.5 g/dL — ABNORMAL LOW (ref 13.0–17.0)
MCHC: 31.2 g/dL (ref 30.0–36.0)
MCV: 85.3 fl (ref 78.0–100.0)
Platelets: 118 10*3/uL — ABNORMAL LOW (ref 150.0–400.0)
RBC: 3.94 Mil/uL — ABNORMAL LOW (ref 4.22–5.81)
RDW: 16.4 % — ABNORMAL HIGH (ref 11.5–15.5)
WBC: 5.8 10*3/uL (ref 4.0–10.5)

## 2020-01-27 LAB — BASIC METABOLIC PANEL
BUN: 24 mg/dL — ABNORMAL HIGH (ref 6–23)
CO2: 25 mEq/L (ref 19–32)
Calcium: 7.9 mg/dL — ABNORMAL LOW (ref 8.4–10.5)
Chloride: 112 mEq/L (ref 96–112)
Creatinine, Ser: 1.69 mg/dL — ABNORMAL HIGH (ref 0.40–1.50)
GFR: 45.96 mL/min — ABNORMAL LOW (ref >60.00)
Glucose, Bld: 138 mg/dL — ABNORMAL HIGH (ref 70–99)
Potassium: 5.1 mEq/L (ref 3.5–5.1)
Sodium: 142 mEq/L (ref 135–145)

## 2020-01-27 LAB — HEMOGLOBIN A1C: Hgb A1c MFr Bld: 7.2 % — ABNORMAL HIGH (ref 4.6–6.5)

## 2020-01-27 LAB — VITAMIN B12: Vitamin B-12: 125 pg/mL — ABNORMAL LOW (ref 211–911)

## 2020-01-27 NOTE — Addendum Note (Signed)
Addended by: Lynnea Ferrier on: 01/27/2020 04:18 PM   Modules accepted: Orders

## 2020-01-27 NOTE — Progress Notes (Addendum)
Established Patient Office Visit  Subjective:  Patient ID: [name], male    DOB: 1924-04-18  Age: 86 y.o. MRN: [id number]  CC:  Chief Complaint  Patient presents with  . Follow-up    refill/follow up on medications, no concerns.     HPI [name] presents for follow-up of diabetes, iron deficiency, B12 deficiency, anemia, dysesthesias in legs and feet, insomnia.  Patient is no longer taking iron.  He was never able to fill the B12.  He has not been taking his gabapentin and sometimes is awoken at night with icy cold feet when the rest of his body is warm.  Numbness and tingling in his legs and feet persist.  Follow-up with cardiology for A. fib.  He is accompanied by his niece today.  Past Medical History:  Diagnosis Date  . Arthritis   . Atrial fibrillation ([place])   . CKD (chronic kidney disease)   . History of hiatal hernia   . Hx SBO    Last 2013 all treated conservatively  . Hypertension   . Presence of permanent cardiac pacemaker   . Prostate cancer ([place])   . Stroke [facility]) 2017   left facial drooping noted 08/14/2016  . Tachycardia-bradycardia syndrome ([place])   . Type II diabetes mellitus ([place])   . Ventral hernia    x3    Past Surgical History:  Procedure Laterality Date  . ABDOMINAL AORTOGRAM W/LOWER EXTREMITY N/A 08/07/2016   Procedure: Abdominal Aortogram w/Lower Extremity;  Surgeon: Wellington Hampshire, MD;  Location: [place] [facility];  Service: Cardiovascular;  Laterality: N/A;  . CHOLECYSTECTOMY OPEN  03/31/2001   Dr Lindon Romp  . COLON SURGERY    . EXPLORATORY LAPAROTOMY  08/2004   Archie Endo 10/01/2010  . HEMORRHOID SURGERY    . HERNIA REPAIR    . HIATAL HERNIA REPAIR  2002  . INCISIONAL HERNIA REPAIR  08/2004   Archie Endo 10/01/2010  . INSERT / REPLACE / REMOVE PACEMAKER    . PERIPHERAL VASCULAR BALLOON ANGIOPLASTY  08/07/2016   Procedure: Peripheral Vascular Balloon Angioplasty;  Surgeon: Wellington Hampshire, MD;  Location: [place] [facility];  Service:  Cardiovascular;;  left popliteal artery  . PERIPHERAL VASCULAR INTERVENTION  08/14/2016   popliteal artery/notes 08/14/2016  . PERIPHERAL VASCULAR INTERVENTION Left 08/14/2016   Procedure: Peripheral Vascular Intervention;  Surgeon: Wellington Hampshire, MD;  Location: [place] [facility];  Service: Cardiovascular;  Laterality: Left;  popliteal artery  . PERMANENT PACEMAKER GENERATOR CHANGE N/A 04/06/2012   Procedure: PERMANENT PACEMAKER GENERATOR CHANGE;  Surgeon: Evans Lance, MD;  Location: [facility] C[facility];  Service: Cardiovascular;  Laterality: N/A;  . SMALL INTESTINE SURGERY  09/16/2004   SBO resection, VH repair    Family History  Problem Relation Age of Onset  . Pneumonia Father   . Stroke Brother   . Stroke Sister   . Cancer Brother        type unknown    Social History   Socioeconomic History  . Marital status: Widowed    Spouse name: Not on file  . Number of children: Not on file  . Years of education: Not on file  . Highest education level: Not on file  Occupational History  . Not on file  Tobacco Use  . Smoking status: Former Smoker    Packs/day: 1.50    Years: 15.00    Pack years: 22.50    Quit date: 04/16/1955    Years since quitting: 64.8  .  Smokeless tobacco: Former Systems developer    Types: [place] date: 08/17/1952  Substance and Sexual Activity  . Alcohol use: No    Comment: 08/14/2016 "drank beer when I was a teenager"  . Drug use: No  . Sexual activity: Not on file  Other Topics Concern  . Not on file  Social History Narrative   Lives alone   Social Determinants of Health   Financial Resource Strain:   . Difficulty of Paying Living Expenses: Not on file  Food Insecurity:   . Worried About Charity fundraiser in the Last Year: Not on file  . Ran Out of Food in the Last Year: Not on file  Transportation Needs:   . Lack of Transportation (Medical): Not on file  . Lack of Transportation (Non-Medical): Not on file  Physical Activity:   . Days of Exercise per  Week: Not on file  . Minutes of Exercise per Session: Not on file  Stress:   . Feeling of Stress : Not on file  Social Connections:   . Frequency of Communication with Friends and Family: Not on file  . Frequency of Social Gatherings with Friends and Family: Not on file  . Attends Religious Services: Not on file  . Active Member of Clubs or Organizations: Not on file  . Attends Archivist Meetings: Not on file  . Marital Status: Not on file  Intimate Partner Violence:   . Fear of Current or Ex-Partner: Not on file  . Emotionally Abused: Not on file  . Physically Abused: Not on file  . Sexually Abused: Not on file    Outpatient Medications Prior to Visit  Medication Sig Dispense Refill  . gabapentin (NEURONTIN) 100 MG capsule Take 2 capsules (200 mg total) by mouth at bedtime. 60 capsule 2  . losartan (COZAAR) 100 MG tablet Take 1 tablet (100 mg total) by mouth daily. 90 tablet 3  . metoprolol succinate (TOPROL-XL) 25 MG 24 hr tablet TAKE 1 TABLET(25 MG) BY MOUTH EVERY EVENING 90 tablet 1  . mirtazapine (REMERON) 7.5 MG tablet Take 1 tablet (7.5 mg total) by mouth at bedtime. 30 tablet 0  . pantoprazole (PROTONIX) 40 MG tablet TAKE 1 TABLET(40 MG) BY MOUTH DAILY 90 tablet 1  . XARELTO 15 MG TABS tablet TAKE 1 TABLET(15 MG) BY MOUTH DAILY WITH SUPPER 30 tablet 3  . fluticasone (FLONASE) 50 MCG/ACT nasal spray SHAKE LIQUID AND USE 2 SPRAYS IN EACH NOSTRIL DAILY (Patient not taking: Reported on 01/27/2020) 16 g 6  . megestrol (MEGACE) 400 MG/10ML suspension Take 10 mLs (400 mg total) by mouth daily. (Patient not taking: Reported on 01/27/2020) 240 mL 5  . FEROSUL 325 (65 Fe) MG tablet TAKE 1 TABLET(325 MG) BY MOUTH DAILY WITH BREAKFAST (Patient not taking: Reported on 01/27/2020) 90 tablet 0   No facility-administered medications prior to visit.    No Known Allergies  ROS Review of Systems  Constitutional: Negative.   HENT: Negative.   Eyes: Negative for photophobia and visual  disturbance.  Respiratory: Negative.   Cardiovascular: Negative.   Gastrointestinal: Negative.   Endocrine: Negative for polyphagia and polyuria.  Genitourinary: Negative.   Musculoskeletal: Negative for joint swelling.  Skin: Negative for pallor.  Neurological: Positive for numbness. Negative for speech difficulty.  Psychiatric/Behavioral: Positive for sleep disturbance.      Objective:    Physical Exam Vitals and nursing note reviewed.  Constitutional:      General: He is not in  acute distress.    Appearance: Normal appearance. He is not ill-appearing or toxic-appearing.  HENT:     Head: Normocephalic and atraumatic.     Right Ear: External ear normal.     Left Ear: External ear normal.  Eyes:     General: No scleral icterus.       Right eye: No discharge.        Left eye: No discharge.     Conjunctiva/sclera: Conjunctivae normal.  Cardiovascular:     Rate and Rhythm: Normal rate. Rhythm regularly irregular.  Pulmonary:     Effort: Pulmonary effort is normal.     Breath sounds: Normal breath sounds.  Abdominal:     General: Bowel sounds are normal.  Musculoskeletal:     Cervical back: No rigidity or tenderness.  Lymphadenopathy:     Cervical: No cervical adenopathy.  Skin:    General: Skin is warm and dry.  Neurological:     Mental Status: He is alert and oriented to person, place, and time.  Psychiatric:        Mood and Affect: Mood normal.        Behavior: Behavior normal.     BP 116/67   Pulse (!) 105   Temp (!) 97.5 F (36.4 C) (Tympanic)   Ht 5\' 10"  (1.778 m)   Wt 147 lb 3.2 oz (66.8 kg)   SpO2 97%   BMI 21.12 kg/m  Wt Readings from Last 3 Encounters:  01/27/20 147 lb 3.2 oz (66.8 kg)  01/18/20 145 lb (65.8 kg)  03/09/19 147 lb (66.7 kg)     Health Maintenance Due  Topic Date Due  . FOOT EXAM  Never done  . OPHTHALMOLOGY EXAM  Never done  . TETANUS/TDAP  Never done  . PNA vac Low Risk Adult (1 of 2 - PCV13) Never done  . INFLUENZA VACCINE   12/19/2019    There are no preventive care reminders to display for this patient.  Lab Results  Component Value Date   TSH 0.811 04/07/2010   Lab Results  Component Value Date   WBC 5.8 01/27/2020   HGB 10.5 (L) 01/27/2020   HCT 33.6 (L) 01/27/2020   MCV 85.3 01/27/2020   PLT 118.0 (L) 01/27/2020   Lab Results  Component Value Date   NA 142 01/27/2020   K 5.1 01/27/2020   CO2 25 01/27/2020   GLUCOSE 138 (H) 01/27/2020   BUN 24 (H) 01/27/2020   CREATININE 1.69 (H) 01/27/2020   BILITOT 0.6 12/05/2017   ALKPHOS 57 12/05/2017   AST 20 12/05/2017   ALT 13 12/05/2017   PROT 6.3 (L) 12/05/2017   ALBUMIN 3.3 (L) 12/05/2017   CALCIUM 7.9 (L) 01/27/2020   ANIONGAP 6 12/05/2017   GFR 45.96 (L) 01/27/2020   Lab Results  Component Value Date   CHOL 122 07/23/2015   Lab Results  Component Value Date   HDL 43 07/23/2015   Lab Results  Component Value Date   LDLCALC 68 07/23/2015   Lab Results  Component Value Date   TRIG 55 07/23/2015   Lab Results  Component Value Date   CHOLHDL 2.8 07/23/2015   Lab Results  Component Value Date   HGBA1C 7.2 (H) 01/27/2020      Assessment & Plan:   Problem List Items Addressed This Visit      Endocrine   Diabetes ([place])   Relevant Orders   Basic metabolic panel (Completed)   Hemoglobin A1c (Completed)  Genitourinary   Chronic kidney disease   Relevant Orders   Basic metabolic panel (Completed)     Other   Microcytic anemia - Primary   Relevant Medications   vitamin B-12 (CYANOCOBALAMIN) 1000 MCG tablet   ferrous sulfate (FEROSUL) 325 (65 FE) MG tablet   Other Relevant Orders   CBC (Completed)   Iron, TIBC and Ferritin Panel (Completed)   Iron deficiency anemia   Relevant Medications   vitamin B-12 (CYANOCOBALAMIN) 1000 MCG tablet   ferrous sulfate (FEROSUL) 325 (65 FE) MG tablet   Other Relevant Orders   CBC (Completed)   Iron, TIBC and Ferritin Panel (Completed)   Psychophysiological insomnia   B12  deficiency   Relevant Medications   vitamin B-12 (CYANOCOBALAMIN) 1000 MCG tablet   Other Relevant Orders   Vitamin B12 (Completed)      Meds ordered this encounter  Medications  . vitamin B-12 (CYANOCOBALAMIN) 1000 MCG tablet    Sig: Take 1 tablet (1,000 mcg total) by mouth daily.    Dispense:  90 tablet    Refill:  3  . ferrous sulfate (FEROSUL) 325 (65 FE) MG tablet    Sig: Take one by mouth every other day.    Dispense:  90 tablet    Refill:  2    Follow-up: Return in about 6 months (around 07/26/2020), or may need to be sooner pending blood work. look also for B12 rx at pharmacy. May need to restart iron.    [name], MD

## 2020-01-28 ENCOUNTER — Ambulatory Visit (INDEPENDENT_AMBULATORY_CARE_PROVIDER_SITE_OTHER): Payer: Medicare Other | Admitting: *Deleted

## 2020-01-28 DIAGNOSIS — I495 Sick sinus syndrome: Secondary | ICD-10-CM

## 2020-01-28 LAB — IRON,TIBC AND FERRITIN PANEL
%SAT: 25 % (calc) (ref 20–48)
Ferritin: 114 ng/mL (ref 24–380)
Iron: 63 ug/dL (ref 50–180)
TIBC: 248 mcg/dL (calc) — ABNORMAL LOW (ref 250–425)

## 2020-01-28 MED ORDER — VITAMIN B-12 1000 MCG PO TABS: 1000.0000 ug | ORAL_TABLET | Freq: Every day | ORAL | 3 refills | Status: DC

## 2020-01-28 MED ORDER — FERROUS SULFATE 325 (65 FE) MG PO TABS: ORAL_TABLET | ORAL | 2 refills | Status: DC

## 2020-01-28 NOTE — Addendum Note (Signed)
Addended by: Jon Billings on: 01/28/2020 07:16 AM   Modules accepted: Orders

## 2020-01-31 LAB — CUP PACEART REMOTE DEVICE CHECK
Battery Impedance: 3956 Ohm
Battery Remaining Longevity: 15 mo
Battery Voltage: 2.7 V
Brady Statistic RV Percent Paced: 36 %
Date Time Interrogation Session: 20210911140552
Implantable Lead Implant Date: 20060510
Implantable Lead Location: 753860
Implantable Lead Model: 5076
Implantable Pulse Generator Implant Date: 20131118
Lead Channel Impedance Value: 0 Ohm
Lead Channel Impedance Value: 444 Ohm
Lead Channel Pacing Threshold Amplitude: 1 V
Lead Channel Pacing Threshold Pulse Width: 0.4 ms
Lead Channel Setting Pacing Amplitude: 2.5 V
Lead Channel Setting Pacing Pulse Width: 0.4 ms
Lead Channel Setting Sensing Sensitivity: 5.6 mV

## 2020-02-01 NOTE — Progress Notes (Signed)
Remote pacemaker transmission.   

## 2020-02-05 ENCOUNTER — Encounter: Payer: Self-pay | Admitting: Family Medicine

## 2020-02-07 NOTE — Telephone Encounter (Signed)
Could be the iron tablets. Try taking with food. Doesn't matter what time of day. Take with largest meal.

## 2020-02-28 ENCOUNTER — Encounter: Payer: Self-pay | Admitting: Family Medicine

## 2020-02-28 DIAGNOSIS — F5104 Psychophysiologic insomnia: Secondary | ICD-10-CM

## 2020-02-29 MED ORDER — MIRTAZAPINE 7.5 MG PO TABS: 7.5000 mg | ORAL_TABLET | Freq: Every day | ORAL | 2 refills | Status: DC

## 2020-03-10 ENCOUNTER — Other Ambulatory Visit: Payer: Self-pay | Admitting: Family Medicine

## 2020-03-10 DIAGNOSIS — I480 Paroxysmal atrial fibrillation: Secondary | ICD-10-CM

## 2020-03-21 ENCOUNTER — Ambulatory Visit (INDEPENDENT_AMBULATORY_CARE_PROVIDER_SITE_OTHER): Payer: Medicare HMO | Admitting: Nurse Practitioner

## 2020-03-21 ENCOUNTER — Encounter: Payer: Self-pay | Admitting: Nurse Practitioner

## 2020-03-21 ENCOUNTER — Other Ambulatory Visit: Payer: Self-pay

## 2020-03-21 ENCOUNTER — Encounter: Payer: Self-pay | Admitting: Family Medicine

## 2020-03-21 VITALS — BP 116/84 | HR 89 | Temp 97.3°F | Wt 159.4 lb

## 2020-03-21 DIAGNOSIS — R600 Localized edema: Secondary | ICD-10-CM

## 2020-03-21 DIAGNOSIS — M6281 Muscle weakness (generalized): Secondary | ICD-10-CM | POA: Diagnosis not present

## 2020-03-21 NOTE — Telephone Encounter (Signed)
Appointment scheduled for OV today.

## 2020-03-21 NOTE — Patient Instructions (Addendum)
Ok to hold remeron and gabapentin Use gabapentin as needed at bedtime for pain. Continue other medications. Maintain adequate oral hydration with water mostly Eat small frequent meals Ok to drink ensure or boost to supplement meals.   Dr. Ethelene Hal know if you change your mind about home health services  Go to lab for blood draw

## 2020-03-21 NOTE — Progress Notes (Signed)
Subjective:  Patient ID: [name], male    DOB: [birth year]  Age: 86 y.o. MRN: [id number]  CC: Acute Visit (Pt would like to discuss medication due to patient feeling weak. Pt states he was very wobbly and weak when getting up and ready for the day yesterday. Knee weakness)  HPI [name] is accompanied by his niece today. He reports worsening generalized muscle weakness yesterday morning, lasted about 70mins. Symptoms have improved since yesterday. First noted after resuming gabapentin 100mg  and remeron 7.5mg  at HS 38months ago, but has got worse yesterday. He denies any fall or syncope or dizziness or palpitations or diaphoresis or fever or cough or SOB or any GI/GU symptoms. His niece is concerned about poor oral hydration and nutrition. She purchased ensure, but he will not drink. He receives meals from meals on wheel but does not finish a meal. He drinks mostly soda and coffee. He has declined any home health services  BP Readings from Last 3 Encounters:  03/21/20 116/84  01/27/20 116/67  01/18/20 (!) 92/50   Wt Readings from Last 3 Encounters:  03/21/20 159 lb 6.4 oz (72.3 kg)  01/27/20 147 lb 3.2 oz (66.8 kg)  01/18/20 145 lb (65.8 kg)   Reviewed past Medical, Social and Family history today.  Outpatient Medications Prior to Visit  Medication Sig Dispense Refill  . Ascorbic Acid (VITAMIN C) 1000 MG tablet Take 1,000 mg by mouth daily.    . Cholecalciferol (VITAMIN D-3) 125 MCG (5000 UT) TABS Take by mouth.    . ferrous sulfate (FEROSUL) 325 (65 FE) MG tablet Take one by mouth every other day. 90 tablet 2  . gabapentin (NEURONTIN) 100 MG capsule Take 2 capsules (200 mg total) by mouth at bedtime. 60 capsule 2  . losartan (COZAAR) 100 MG tablet Take 1 tablet (100 mg total) by mouth daily. 90 tablet 3  . metoprolol succinate (TOPROL-XL) 25 MG 24 hr tablet TAKE 1 TABLET(25 MG) BY MOUTH EVERY EVENING 90 tablet 1  . mirtazapine (REMERON) 7.5 MG tablet Take 1 tablet (7.5 mg  total) by mouth at bedtime. 30 tablet 2  . niacin 500 MG tablet Take 500 mg by mouth at bedtime.    . pantoprazole (PROTONIX) 40 MG tablet TAKE 1 TABLET(40 MG) BY MOUTH DAILY 90 tablet 1  . vitamin B-12 (CYANOCOBALAMIN) 1000 MCG tablet Take 1 tablet (1,000 mcg total) by mouth daily. 90 tablet 3  . XARELTO 15 MG TABS tablet TAKE 1 TABLET(15 MG) BY MOUTH DAILY WITH SUPPER 30 tablet 3  . zinc gluconate 50 MG tablet Take 50 mg by mouth daily.    . fluticasone (FLONASE) 50 MCG/ACT nasal spray SHAKE LIQUID AND USE 2 SPRAYS IN EACH NOSTRIL DAILY (Patient not taking: Reported on 01/27/2020) 16 g 6  . megestrol (MEGACE) 400 MG/10ML suspension Take 10 mLs (400 mg total) by mouth daily. (Patient not taking: Reported on 01/27/2020) 240 mL 5   No facility-administered medications prior to visit.   ROS See HPI  Objective:  BP 116/84 (BP Location: Left Arm, Patient Position: Sitting, Cuff Size: Normal)   Pulse 89   Temp (!) 97.3 F (36.3 C) (Temporal)   Wt 159 lb 6.4 oz (72.3 kg)   SpO2 97%   BMI 22.87 kg/m   Physical Exam Cardiovascular:     Rate and Rhythm: Rhythm irregular.     Pulses: Normal pulses.  Pulmonary:     Effort: Pulmonary effort is normal.     Breath sounds:  Normal breath sounds.  Musculoskeletal:     Right lower leg: Edema present.     Left lower leg: Edema present.  Skin:    Findings: No erythema or rash.  Neurological:     Mental Status: He is alert and oriented to person, place, and time.  Psychiatric:        Mood and Affect: Mood normal.        Behavior: Behavior normal.    Assessment & Plan:  This visit occurred during the SARS-CoV-2 public health emergency.  Safety protocols were in place, including screening questions prior to the visit, additional usage of staff PPE, and extensive cleaning of exam room while observing appropriate contact time as indicated for disinfecting solutions.   [name] was seen today for acute visit.  Diagnoses and all orders for this  visit:  Generalized muscle weakness -     Basic metabolic panel -     CBC with Differential/Platelet -     TSH -     B Nat Peptide  Localized edema -     B Nat Peptide  Ok to hold remeron and gabapentin Use gabapentin as needed at bedtime for pain. Continue other medications. Use knee high compression stocking during the day and off at night. Maintain adequate oral hydration with water mostly Eat small frequent meals Ok to drink ensure or boost to supplement meals.  Dr. Ethelene Hal know if you change your mind about home health services  Problem List Items Addressed This Visit    None    Visit Diagnoses    Generalized muscle weakness    -  Primary   Relevant Orders   Basic metabolic panel   CBC with Differential/Platelet   TSH   B Nat Peptide   Localized edema       Relevant Orders   B Nat Peptide      Follow-up: Return if symptoms worsen or fail to improve.  Wilfred Lacy, NP

## 2020-03-21 NOTE — Telephone Encounter (Signed)
Caller Name: Mrs. Ronnald Ramp, caregiver Call back phone #: [phone]  Reason for Call: Mrs. Ronnald Ramp noted that pt is very weak and stated he is not going to take anymore of the neurotin until he talks to the doctor. Pt said yesterday that he could barely walk and almost fell due to being weak/unsteady. Advised of CMAs message below to go to ER for worsening sx. Pt was not with her to be able to transfer call to nurse triage.

## 2020-03-22 LAB — CBC WITH DIFFERENTIAL/PLATELET
Basophils Absolute: 0.1 10*3/uL (ref 0.0–0.1)
Basophils Relative: 1.2 % (ref 0.0–3.0)
Eosinophils Absolute: 0.2 10*3/uL (ref 0.0–0.7)
Eosinophils Relative: 2.9 % (ref 0.0–5.0)
HCT: 31.5 % — ABNORMAL LOW (ref 39.0–52.0)
Hemoglobin: 10.3 g/dL — ABNORMAL LOW (ref 13.0–17.0)
Lymphocytes Relative: 23.3 % (ref 12.0–46.0)
Lymphs Abs: 1.5 10*3/uL (ref 0.7–4.0)
MCHC: 32.8 g/dL (ref 30.0–36.0)
MCV: 81.4 fl (ref 78.0–100.0)
Monocytes Absolute: 0.7 10*3/uL (ref 0.1–1.0)
Monocytes Relative: 11.4 % (ref 3.0–12.0)
Neutro Abs: 3.8 10*3/uL (ref 1.4–7.7)
Neutrophils Relative %: 61.2 % (ref 43.0–77.0)
Platelets: 158 10*3/uL (ref 150.0–400.0)
RBC: 3.87 Mil/uL — ABNORMAL LOW (ref 4.22–5.81)
RDW: 16.7 % — ABNORMAL HIGH (ref 11.5–15.5)
WBC: 6.2 10*3/uL (ref 4.0–10.5)

## 2020-03-22 LAB — BASIC METABOLIC PANEL
BUN: 17 mg/dL (ref 6–23)
CO2: 23 mEq/L (ref 19–32)
Calcium: 7.7 mg/dL — ABNORMAL LOW (ref 8.4–10.5)
Chloride: 111 mEq/L (ref 96–112)
Creatinine, Ser: 1.56 mg/dL — ABNORMAL HIGH (ref 0.40–1.50)
GFR: 37.9 mL/min — ABNORMAL LOW (ref >60.00)
Glucose, Bld: 99 mg/dL (ref 70–99)
Potassium: 4.9 mEq/L (ref 3.5–5.1)
Sodium: 142 mEq/L (ref 135–145)

## 2020-03-22 LAB — BRAIN NATRIURETIC PEPTIDE: Pro B Natriuretic peptide (BNP): 313 pg/mL — ABNORMAL HIGH (ref 0.0–100.0)

## 2020-03-22 LAB — TSH: TSH: 2.8 u[IU]/mL (ref 0.35–4.50)

## 2020-04-05 ENCOUNTER — Encounter: Payer: Self-pay | Admitting: Internal Medicine

## 2020-04-05 ENCOUNTER — Other Ambulatory Visit: Payer: Self-pay

## 2020-04-05 ENCOUNTER — Ambulatory Visit (INDEPENDENT_AMBULATORY_CARE_PROVIDER_SITE_OTHER): Payer: Medicare HMO | Admitting: Internal Medicine

## 2020-04-05 VITALS — BP 118/78 | HR 96 | Ht 70.0 in | Wt 158.6 lb

## 2020-04-05 DIAGNOSIS — I495 Sick sinus syndrome: Secondary | ICD-10-CM | POA: Diagnosis not present

## 2020-04-05 DIAGNOSIS — I482 Chronic atrial fibrillation, unspecified: Secondary | ICD-10-CM | POA: Diagnosis not present

## 2020-04-05 DIAGNOSIS — Z950 Presence of cardiac pacemaker: Secondary | ICD-10-CM | POA: Diagnosis not present

## 2020-04-05 DIAGNOSIS — I4891 Unspecified atrial fibrillation: Secondary | ICD-10-CM | POA: Diagnosis not present

## 2020-04-05 DIAGNOSIS — I5030 Unspecified diastolic (congestive) heart failure: Secondary | ICD-10-CM | POA: Diagnosis not present

## 2020-04-05 NOTE — Progress Notes (Signed)
HPI [name] returns today for followup. He is a pleasant 86 yo man with CHB, s/p PPM insertion. He has a remote stroke. He has some residual. He denies chest pain or sob. No syncope. His bp has been controlled. He has had some swelling in his legs. He denies palpitations. No Known Allergies   Current Outpatient Medications  Medication Sig Dispense Refill   Cholecalciferol (VITAMIN D-3) 125 MCG (5000 UT) TABS Take by mouth.     ferrous sulfate (FEROSUL) 325 (65 FE) MG tablet Take one by mouth every other day. 90 tablet 2   gabapentin (NEURONTIN) 100 MG capsule Take 100 mg by mouth at bedtime.     losartan (COZAAR) 100 MG tablet Take 1 tablet (100 mg total) by mouth daily. 90 tablet 3   metoprolol succinate (TOPROL-XL) 25 MG 24 hr tablet TAKE 1 TABLET(25 MG) BY MOUTH EVERY EVENING 90 tablet 1   mirtazapine (REMERON) 7.5 MG tablet Take 1 tablet (7.5 mg total) by mouth at bedtime. 30 tablet 2   niacin 500 MG tablet Take 500 mg by mouth at bedtime.     pantoprazole (PROTONIX) 40 MG tablet TAKE 1 TABLET(40 MG) BY MOUTH DAILY 90 tablet 1   vitamin B-12 (CYANOCOBALAMIN) 1000 MCG tablet Take 1 tablet (1,000 mcg total) by mouth daily. 90 tablet 3   XARELTO 15 MG TABS tablet TAKE 1 TABLET(15 MG) BY MOUTH DAILY WITH SUPPER 30 tablet 3   zinc gluconate 50 MG tablet Take 50 mg by mouth daily.     No current facility-administered medications for this visit.     Past Medical History:  Diagnosis Date   Arthritis    Atrial fibrillation ([place])    CKD (chronic kidney disease)    History of hiatal hernia    Hx SBO    Last 2013 all treated conservatively   Hypertension    Presence of permanent cardiac pacemaker    Prostate cancer ([place])    Stroke ([place]) 2017   left facial drooping noted 08/14/2016   Tachycardia-bradycardia syndrome ([employer])    Type II diabetes mellitus ([place])    Ventral hernia    x3    ROS:   All systems reviewed and negative except as noted in the  HPI.   Past Surgical History:  Procedure Laterality Date   ABDOMINAL AORTOGRAM W/LOWER EXTREMITY N/A 08/07/2016   Procedure: Abdominal Aortogram w/Lower Extremity;  Surgeon: Wellington Hampshire, MD;  Location: [place] [facility];  Service: Cardiovascular;  Laterality: N/A;   CHOLECYSTECTOMY OPEN  03/31/2001   Dr Lindon Romp   COLON SURGERY     EXPLORATORY LAPAROTOMY  08/2004   Archie Endo 10/01/2010   HEMORRHOID SURGERY     HERNIA REPAIR     HIATAL HERNIA REPAIR  2002   INCISIONAL HERNIA REPAIR  08/2004   Archie Endo 10/01/2010   INSERT / REPLACE / REMOVE PACEMAKER     PERIPHERAL VASCULAR BALLOON ANGIOPLASTY  08/07/2016   Procedure: Peripheral Vascular Balloon Angioplasty;  Surgeon: Wellington Hampshire, MD;  Location: [place];  Service: Cardiovascular;;  left popliteal artery   PERIPHERAL VASCULAR INTERVENTION  08/14/2016   popliteal artery/notes 08/14/2016   PERIPHERAL VASCULAR INTERVENTION Left 08/14/2016   Procedure: Peripheral Vascular Intervention;  Surgeon: Wellington Hampshire, MD;  Location: [facility];  Service: Cardiovascular;  Laterality: Left;  popliteal artery   PERMANENT PACEMAKER GENERATOR CHANGE N/A 04/06/2012   Procedure: PERMANENT PACEMAKER GENERATOR CHANGE;  Surgeon: Evans Lance, MD;  Location: [facility];  Service: Cardiovascular;  Laterality: N/A;   SMALL INTESTINE SURGERY  09/16/2004   SBO resection, VH repair     Family History  Problem Relation Age of Onset   Pneumonia Father    Stroke Brother    Stroke Sister    Cancer Brother        type unknown     Social History   Socioeconomic History   Marital status: Widowed    Spouse name: Not on file   Number of children: Not on file   Years of education: Not on file   Highest education level: Not on file  Occupational History   Not on file  Tobacco Use   Smoking status: Former Smoker    Packs/day: 1.50    Years: 15.00    Pack years: 22.50    Quit date: 04/16/1955    Years since  quitting: 65.0   Smokeless tobacco: Former Systems developer    Types: [place] date: 08/17/1952  Substance and Sexual Activity   Alcohol use: No    Comment: 08/14/2016 "drank beer when I was a teenager"   Drug use: No   Sexual activity: Not on file  Other Topics Concern   Not on file  Social History Narrative   Lives alone   Social Determinants of Health   Financial Resource Strain:    Difficulty of Paying Living Expenses: Not on file  Food Insecurity:    Worried About Charity fundraiser in the Last Year: Not on file   [employer] of Food in the Last Year: Not on file  Transportation Needs:    Lack of Transportation (Medical): Not on file   Lack of Transportation (Non-Medical): Not on file  Physical Activity:    Days of Exercise per Week: Not on file   Minutes of Exercise per Session: Not on file  Stress:    Feeling of Stress : Not on file  Social Connections:    Frequency of Communication with Friends and Family: Not on file   Frequency of Social Gatherings with Friends and Family: Not on file   Attends Religious Services: Not on file   Active Member of [place] or Organizations: Not on file   Attends Archivist Meetings: Not on file   Marital Status: Not on file  Intimate Partner Violence:    Fear of Current or Ex-Partner: Not on file   Emotionally Abused: Not on file   Physically Abused: Not on file   Sexually Abused: Not on file     BP 118/78    Pulse 96    Ht 5\' 10"  (1.778 m)    Wt 158 lb 9.6 oz (71.9 kg)    SpO2 98%    BMI 22.76 kg/m   Physical Exam:  Well appearing NAD HEENT: Unremarkable Neck:  No JVD, no thyromegally Lymphatics:  No adenopathy Back:  No CVA tenderness Lungs:  Clear with no wheezes HEART:  Regular rate rhythm, no murmurs, no rubs, no clicks Abd:  soft, positive bowel sounds, no organomegally, no rebound, no guarding Ext:  2 plus pulses, no edema, no cyanosis, no clubbing Skin:  No rashes no nodules Neuro:  CN II  through XII intact, motor grossly intact  EKG - atrial fib with a controlled VR  DEVICE  Normal device function.  See PaceArt for details.   Assess/Plan: 1. Atrial fib - his rates are increased a bit but he is asymptomatic. He does not  have palpitations. He remains on Xarelto.  2. Diastolic heart failure - he will continue his current meds. He will maintain a low sodium diet. 3. Venous insufficiency - I asked him to elevate his legs and avoid salty foods. 4. Wt loss - his weight is back up over the past year.   Carleene Overlie Margareth Kanner,MD

## 2020-04-05 NOTE — Patient Instructions (Addendum)
Medication Instructions:  Your physician recommends that you continue on your current medications as directed. Please refer to the Current Medication list given to you today.  Labwork: None ordered.  Testing/Procedures: None ordered.  Follow-Up: Your physician wants you to follow-up in: one year with Dr. Lovena Le.   You will receive a reminder letter in the mail two months in advance. If you don't receive a letter, please call our office to schedule the follow-up appointment.  Remote monitoring is used to monitor your Pacemaker from home. This monitoring reduces the number of office visits required to check your device to one time per year. It allows [place] to keep an eye on the functioning of your device to ensure it is working properly. You are scheduled for a device check from home on 04/28/2020. You may send your transmission at any time that day. If you have a wireless device, the transmission will be sent automatically. After your physician reviews your transmission, you will receive a postcard with your next transmission date.  Any Other Special Instructions Will Be Listed Below (If Applicable).  If you need a refill on your cardiac medications before your next appointment, please call your pharmacy.

## 2020-04-20 ENCOUNTER — Other Ambulatory Visit: Payer: Self-pay | Admitting: Family Medicine

## 2020-04-20 DIAGNOSIS — I480 Paroxysmal atrial fibrillation: Secondary | ICD-10-CM

## 2020-04-20 NOTE — Telephone Encounter (Signed)
Last OV 01/27/20 Last fill 12/21/19  #30/3

## 2020-04-28 ENCOUNTER — Ambulatory Visit (INDEPENDENT_AMBULATORY_CARE_PROVIDER_SITE_OTHER): Payer: Medicare Other

## 2020-04-28 DIAGNOSIS — I495 Sick sinus syndrome: Secondary | ICD-10-CM

## 2020-05-01 LAB — CUP PACEART REMOTE DEVICE CHECK
Battery Impedance: 4075 Ohm
Battery Remaining Longevity: 15 mo
Battery Voltage: 2.69 V
Brady Statistic RV Percent Paced: 16 %
Date Time Interrogation Session: 20211210175957
Implantable Lead Implant Date: 20060510
Implantable Lead Location: 753860
Implantable Lead Model: 5076
Implantable Pulse Generator Implant Date: 20131118
Lead Channel Impedance Value: 0 Ohm
Lead Channel Impedance Value: 451 Ohm
Lead Channel Pacing Threshold Amplitude: 0.875 V
Lead Channel Pacing Threshold Pulse Width: 0.4 ms
Lead Channel Setting Pacing Amplitude: 2.5 V
Lead Channel Setting Pacing Pulse Width: 0.4 ms
Lead Channel Setting Sensing Sensitivity: 5.6 mV

## 2020-05-03 DIAGNOSIS — H401124 Primary open-angle glaucoma, left eye, indeterminate stage: Secondary | ICD-10-CM | POA: Diagnosis not present

## 2020-05-03 DIAGNOSIS — H40001 Preglaucoma, unspecified, right eye: Secondary | ICD-10-CM | POA: Diagnosis not present

## 2020-05-05 DIAGNOSIS — I70223 Atherosclerosis of native arteries of extremities with rest pain, bilateral legs: Secondary | ICD-10-CM | POA: Diagnosis not present

## 2020-05-11 NOTE — Progress Notes (Signed)
Remote pacemaker transmission.   

## 2020-05-13 ENCOUNTER — Emergency Department (HOSPITAL_COMMUNITY): Payer: Medicare HMO

## 2020-05-13 ENCOUNTER — Other Ambulatory Visit: Payer: Self-pay

## 2020-05-13 ENCOUNTER — Emergency Department (HOSPITAL_COMMUNITY)
Admission: EM | Admit: 2020-05-13 | Discharge: 2020-05-13 | Disposition: A | Discharge status: Home / Self Care | Payer: Medicare HMO | Attending: Emergency Medicine | Admitting: Emergency Medicine

## 2020-05-13 ENCOUNTER — Encounter (HOSPITAL_COMMUNITY): Payer: Self-pay | Admitting: Emergency Medicine

## 2020-05-13 DIAGNOSIS — S59902A Unspecified injury of left elbow, initial encounter: Secondary | ICD-10-CM | POA: Diagnosis present

## 2020-05-13 DIAGNOSIS — Z8546 Personal history of malignant neoplasm of prostate: Secondary | ICD-10-CM | POA: Diagnosis not present

## 2020-05-13 DIAGNOSIS — T148XXA Other injury of unspecified body region, initial encounter: Secondary | ICD-10-CM

## 2020-05-13 DIAGNOSIS — Z950 Presence of cardiac pacemaker: Secondary | ICD-10-CM | POA: Insufficient documentation

## 2020-05-13 DIAGNOSIS — Z87891 Personal history of nicotine dependence: Secondary | ICD-10-CM | POA: Diagnosis not present

## 2020-05-13 DIAGNOSIS — Z79899 Other long term (current) drug therapy: Secondary | ICD-10-CM | POA: Diagnosis not present

## 2020-05-13 DIAGNOSIS — I130 Hypertensive heart and chronic kidney disease with heart failure and stage 1 through stage 4 chronic kidney disease, or unspecified chronic kidney disease: Secondary | ICD-10-CM | POA: Insufficient documentation

## 2020-05-13 DIAGNOSIS — S52022A Displaced fracture of olecranon process without intraarticular extension of left ulna, initial encounter for closed fracture: Secondary | ICD-10-CM

## 2020-05-13 DIAGNOSIS — Z23 Encounter for immunization: Secondary | ICD-10-CM | POA: Insufficient documentation

## 2020-05-13 DIAGNOSIS — Z7901 Long term (current) use of anticoagulants: Secondary | ICD-10-CM | POA: Insufficient documentation

## 2020-05-13 DIAGNOSIS — Y9301 Activity, walking, marching and hiking: Secondary | ICD-10-CM | POA: Diagnosis not present

## 2020-05-13 DIAGNOSIS — E119 Type 2 diabetes mellitus without complications: Secondary | ICD-10-CM | POA: Insufficient documentation

## 2020-05-13 DIAGNOSIS — I4891 Unspecified atrial fibrillation: Secondary | ICD-10-CM | POA: Diagnosis not present

## 2020-05-13 DIAGNOSIS — M25522 Pain in left elbow: Secondary | ICD-10-CM | POA: Diagnosis not present

## 2020-05-13 DIAGNOSIS — N189 Chronic kidney disease, unspecified: Secondary | ICD-10-CM | POA: Diagnosis not present

## 2020-05-13 DIAGNOSIS — W108XXA Fall (on) (from) other stairs and steps, initial encounter: Secondary | ICD-10-CM | POA: Diagnosis not present

## 2020-05-13 DIAGNOSIS — I5032 Chronic diastolic (congestive) heart failure: Secondary | ICD-10-CM | POA: Diagnosis not present

## 2020-05-13 DIAGNOSIS — S0990XA Unspecified injury of head, initial encounter: Secondary | ICD-10-CM | POA: Insufficient documentation

## 2020-05-13 DIAGNOSIS — Z043 Encounter for examination and observation following other accident: Secondary | ICD-10-CM | POA: Diagnosis not present

## 2020-05-13 MED ORDER — TETANUS-DIPHTH-ACELL PERTUSSIS 5-2.5-18.5 LF-MCG/0.5 IM SUSY
0.5000 mL | PREFILLED_SYRINGE | Freq: Once | INTRAMUSCULAR | Status: AC
  Administered 2020-05-13: 22:00:00 0.5 mL via INTRAMUSCULAR
  Filled 2020-05-13: qty 0.5

## 2020-05-13 NOTE — Progress Notes (Signed)
Orthopedic Tech Progress Note Patient Details:  [name] 12/20/1923 [id number]  Ortho Devices Type of Ortho Device: Ace wrap,Arm sling,Long arm splint Ortho Device/Splint Location: left Ortho Device/Splint Interventions: Application   Post Interventions Patient Tolerated: Well Instructions Provided: Care of device   [place] Pink 05/13/2020, 11:01 PM

## 2020-05-13 NOTE — Discharge Instructions (Signed)
Today the x-rays on your elbow show that you broke your left elbow.  Please call [facility] to set up a follow-up appointment.  Please leave your splint on at all times.  If you develop fevers, numbness, or have any other concerns please seek additional medical care and evaluation.   Please take Tylenol (acetaminophen) to relieve your pain.  You may take tylenol, up to 1,000 mg (two extra strength pills).  Do not take more than 3,000 mg tylenol in a 24 hour period.  Please check all medication labels as many medications such as pain and cold medications may contain tylenol. Please do not drink alcohol while taking this medication.   Please elevate your elbow above your heart when ever possible.

## 2020-05-13 NOTE — ED Triage Notes (Signed)
Patient BIB granddaughter, reports trip and fall going downstairs outside today. C/o left arm. Denies LOC.

## 2020-05-13 NOTE — ED Provider Notes (Signed)
[facility]-EMERGENCY DEPT Provider Note   CSN: [id number] Arrival date & time: 05/13/20  1800     History Chief Complaint  Patient presents with  . Fall    [name] is a 86 y.o. male with a past medical history of A. fib anticoagulated with Xarelto, stroke, tachycardia/bradycardia syndrome, CKD, who presents today for evaluation after mechanical, nonsyncopal fall.  History is obtained from patient, chart review and granddaughter at bedside. Patient was walking outside and tripped while walking down the steps.  He states he did strike his head.  His primary area of pain is his left elbow.  He denies any pain in his left shoulder.  No pain in his chest, right arm or bilateral legs. His grand daughter notes that since the fall he has been sleeping a lot, and has not been eating.  No vomiting.  HPI     Past Medical History:  Diagnosis Date  . Arthritis   . Atrial fibrillation ([name])   . CKD (chronic kidney disease)   . History of hiatal hernia   . Hx SBO    Last 2013 all treated conservatively  . Hypertension   . Presence of permanent cardiac pacemaker   . Prostate cancer ([name])   . Stroke [facility]) 2017   left facial drooping noted 08/14/2016  . Tachycardia-bradycardia syndrome ([name])   . Type II diabetes mellitus ([name])   . Ventral hernia    x3    Patient Active Problem List   Diagnosis Date Noted  . B12 deficiency 01/27/2020  . Chronic diastolic heart failure ([name]) 03/09/2019  . Pain in both feet 11/17/2018  . Neuropathy 10/09/2018  . Psychophysiological insomnia 10/09/2018  . Constipation by delayed colonic transit 06/18/2018  . Syncope 12/03/2017  . Dehydration   . AKI (acute kidney injury) ([name]) 12/01/2017  . Iron deficiency anemia 11/25/2017  . Decreased appetite 10/16/2017  . Hearing difficulty of both ears 10/16/2017  . Allergic rhinitis due to pollen 09/03/2017  . Ventricular tachycardia ([name]) 01/14/2017  . Malnutrition of moderate  degree 01/13/2017  . DOE (dyspnea on exertion) 01/12/2017  . Small bowel obstruction ([name]) 01/12/2017  . PAD (peripheral artery disease) ([name]) 08/14/2016  . CVA (cerebral infarction) 07/23/2015  . Microcytic anemia 07/23/2015  . Stroke (cerebrum) ([name]) 07/23/2015  . Chronic kidney disease 07/23/2015  . Acute CVA (cerebrovascular accident) ([name])   . SBO (small bowel obstruction) ([name]) 02/14/2012  . Abdominal pain 02/14/2012  . Nausea & vomiting 02/14/2012  . PPM-[employer]tr[employer] 03/13/2010  . Diabetes ([name]) 11/14/2008  . Atrial fibrillation ([name]) 11/14/2008  . BRADYCARDIA-TACHYCARDIA SYNDROME 11/14/2008    Past Surgical History:  Procedure Laterality Date  . ABDOMINAL AORTOGRAM W/LOWER EXTREMITY N/A 08/07/2016   Procedure: Abdominal Aortogram w/Lower Extremity;  Surgeon: Iran Ouch, MD;  Location: [facility] INVASIVE CV LAB;  Service: Cardiovascular;  Laterality: N/A;  . CHOLECYSTECTOMY OPEN  03/31/2001   Dr Wiliam Ke  . COLON SURGERY    . EXPLORATORY LAPAROTOMY  08/2004   Hattie Perch 10/01/2010  . HEMORRHOID SURGERY    . HERNIA REPAIR    . HIATAL HERNIA REPAIR  2002  . INCISIONAL HERNIA REPAIR  08/2004   Hattie Perch 10/01/2010  . INSERT / REPLACE / REMOVE PACEMAKER    . PERIPHERAL VASCULAR BALLOON ANGIOPLASTY  08/07/2016   Procedure: Peripheral Vascular Balloon Angioplasty;  Surgeon: Iran Ouch, MD;  Location: [facility] INVASIVE CV LAB;  Service: Cardiovascular;;  left popliteal artery  . PERIPHERAL VASCULAR INTERVENTION  08/14/2016  popliteal artery/notes 08/14/2016  . PERIPHERAL VASCULAR INTERVENTION Left 08/14/2016   Procedure: Peripheral Vascular Intervention;  Surgeon: Wellington Hampshire, MD;  Location: [place] CV LAB;  Service: Cardiovascular;  Laterality: Left;  popliteal artery  . PERMANENT PACEMAKER GENERATOR CHANGE N/A 04/06/2012   Procedure: PERMANENT PACEMAKER GENERATOR CHANGE;  Surgeon: Evans Lance, MD;  Location: [facility] CATH LAB;  Service: Cardiovascular;  Laterality: N/A;  . SMALL  INTESTINE SURGERY  09/16/2004   SBO resection, VH repair       Family History  Problem Relation Age of Onset  . Pneumonia Father   . Stroke Brother   . Stroke Sister   . Cancer Brother        type unknown    Social History   Tobacco Use  . Smoking status: Former Smoker    Packs/day: 1.50    Years: 15.00    Pack years: 22.50    Quit date: 04/16/1955    Years since quitting: 65.1  . Smokeless tobacco: Former Systems developer    Types: Chew    Quit date: 08/17/1952  Substance Use Topics  . Alcohol use: No    Comment: 08/14/2016 "drank beer when I was a teenager"  . Drug use: No    Home Medications Prior to Admission medications   Medication Sig Start Date End Date Taking? Authorizing Provider  Cholecalciferol (VITAMIN D-3) 125 MCG (5000 UT) TABS Take by mouth.    [provider]  ferrous sulfate (FEROSUL) 325 (65 FE) MG tablet Take one by mouth every other day. 01/28/20   [name], MD  gabapentin (NEURONTIN) 100 MG capsule Take 100 mg by mouth at bedtime.    [provider]  losartan (COZAAR) 100 MG tablet Take 1 tablet (100 mg total) by mouth daily. 01/18/20 04/17/20  Wellington Hampshire, MD  metoprolol succinate (TOPROL-XL) 25 MG 24 hr tablet TAKE 1 TABLET(25 MG) BY MOUTH EVERY EVENING 03/10/20   [name], MD  mirtazapine (REMERON) 7.5 MG tablet Take 1 tablet (7.5 mg total) by mouth at bedtime. 02/29/20   [name], MD  niacin 500 MG tablet Take 500 mg by mouth at bedtime.    [provider]  pantoprazole (PROTONIX) 40 MG tablet TAKE 1 TABLET(40 MG) BY MOUTH DAILY 12/07/19   [name], MD  vitamin B-12 (CYANOCOBALAMIN) 1000 MCG tablet Take 1 tablet (1,000 mcg total) by mouth daily. 01/28/20   [name], MD  XARELTO 15 MG TABS tablet TAKE 1 TABLET(15 MG) BY MOUTH DAILY WITH SUPPER 04/20/20   [name], MD  zinc gluconate 50 MG tablet Take 50 mg by mouth daily.    [provider]     Allergies    Patient has no known allergies.  Review of Systems   Review of Systems  Constitutional: Negative for chills and fever.  HENT: Negative for congestion.   Eyes: Negative for visual disturbance.  Respiratory: Negative for shortness of breath.   Cardiovascular: Negative for chest pain.  Gastrointestinal: Negative for abdominal pain, diarrhea and vomiting.  Musculoskeletal: Negative for back pain and neck pain.       Pain in left elbow  Skin: Positive for wound.       Abrasion over left elbow  Neurological: Positive for headaches. Negative for tremors, syncope and weakness.  All other systems reviewed and are negative.   Physical Exam Updated Vital Signs BP (!) 164/87 (BP Location: Right Arm)   Pulse 89   Temp  99 F (37.2 C) (Oral)   Resp 19   Ht 5\' 10"  (1.778 m)   Wt 54.4 kg   SpO2 99%   BMI 17.22 kg/m   Physical Exam Vitals and nursing note reviewed.  Constitutional:      General: He is not in acute distress.    Appearance: He is not diaphoretic.     Comments: Sleeping, awakens to voice.    HENT:     Head: Normocephalic and atraumatic.  Eyes:     General: No scleral icterus.       Right eye: No discharge.        Left eye: No discharge.     Conjunctiva/sclera: Conjunctivae normal.  Cardiovascular:     Rate and Rhythm: Normal rate and regular rhythm.  Pulmonary:     Effort: Pulmonary effort is normal. No respiratory distress.     Breath sounds: No stridor.  Abdominal:     General: There is no distension.     Tenderness: There is no abdominal tenderness.  Musculoskeletal:        General: No deformity.     Cervical back: Normal range of motion.     Comments: Left elbow with pain with ROM.  Compartments in LUE are soft and easily compressible.  There is no obvious deformity.  No TTP in right shoulder, proximal upper arm, distal forearm or hand.    Skin:    General: Skin is warm and dry.  Neurological:     Mental Status: He is alert. Mental status  is at baseline.     Motor: No abnormal muscle tone.  Psychiatric:        Behavior: Behavior normal.     ED Results / Procedures / Treatments   Labs (all labs ordered are listed, but only abnormal results are displayed) Labs Reviewed - No data to display  EKG None  Radiology DG Elbow Complete Left  Result Date: 05/13/2020 CLINICAL DATA:  86 year old male with fall and trauma to the left elbow. EXAM: LEFT ELBOW - COMPLETE 3+ VIEW COMPARISON:  None. FINDINGS: Mildly displaced fracture of the olecranon process of the ulna with extension into the articular surface. No other acute fracture identified. There is no dislocation. Mild osteopenia. There is a small joint effusion. There is mild soft tissue swelling over the dorsum of the elbow. No radiopaque foreign object or soft tissue gas. IMPRESSION: Mildly displaced intra-articular fracture of the olecranon process of the ulna. No dislocation. Electronically Signed   By: Anner Crete M.D.   On: 05/13/2020 20:37   CT Head Wo Contrast  Result Date: 05/13/2020 CLINICAL DATA:  Status post fall. EXAM: CT HEAD WITHOUT CONTRAST TECHNIQUE: Contiguous axial images were obtained from the base of the skull through the vertex without intravenous contrast. COMPARISON:  July 23, 2015 FINDINGS: Brain: There is mild cerebral atrophy with widening of the extra-axial spaces and ventricular dilatation. There are areas of decreased attenuation within the white matter tracts of the supratentorial brain, consistent with microvascular disease changes. Vascular: No hyperdense vessel or unexpected calcification. Skull: Normal. Negative for fracture or focal lesion. Sinuses/Orbits: No acute finding. Other: A stable left frontal scalp lipoma is seen. IMPRESSION: 1. Generalized cerebral atrophy. 2. No acute intracranial abnormality. Electronically Signed   By: Virgina Norfolk M.D.   On: 05/13/2020 20:48   CT Cervical Spine Wo Contrast  Result Date:  05/13/2020 CLINICAL DATA:  Status post fall. EXAM: CT CERVICAL SPINE WITHOUT CONTRAST TECHNIQUE: Multidetector CT imaging of the  cervical spine was performed without intravenous contrast. Multiplanar CT image reconstructions were also generated. COMPARISON:  None. FINDINGS: Alignment: Approximately 2 mm retrolisthesis of the C2 vertebral body is noted on C3. Skull base and vertebrae: No acute fracture. No primary bone lesion or focal pathologic process. Soft tissues and spinal canal: No prevertebral fluid or swelling. No visible canal hematoma. Disc levels: Moderate to marked severity anterior osteophyte formation is seen at the levels of C2-C3, C3-C4, C4-C5 and C5-C6. Moderate to marked severity intervertebral disc space narrowing is seen at the levels of C3-C4 and C6-C7. Bilateral marked severity multilevel facet joint hypertrophy is noted. Upper chest: Negative. Other: None. IMPRESSION: 1. No acute osseous abnormality. 2. Moderate to marked severity multilevel degenerative changes, most prominent at the levels of C3-C4 and C6-C7. Electronically Signed   By: Virgina Norfolk M.D.   On: 05/13/2020 20:53    Procedures Procedures (including critical care time)  Medications Ordered in ED Medications  Tdap (BOOSTRIX) injection 0.5 mL (0.5 mLs Intramuscular Given 05/13/20 2223)    ED Course  I have reviewed the triage vital signs and the nursing notes.  Pertinent labs & imaging results that were available during my care of the patient were reviewed by me and considered in my medical decision making (see chart for details).  Clinical Course as of 05/14/20 0004  Sat May 13, 2020  2233 I spoke with Dr. Ihor Gully on-call for orthopedics.  He recommends cleaning the abrasion on the wound with a posterior arm splint and sling and outpatient follow-up. [EH]    Clinical Course User Index [EH] Ollen Gross   MDM Rules/Calculators/A&P                         Patient is a 86 year old man who  presents today for evaluation of left elbow pain and reportedly sleeping more than usual after a mechanical, nonsyncopal fall caused him to fall and hurt his left elbow and hit his head.  He is anticoagulated.  CT scan of head and neck obtained without intracranial hemorrhage or other acute abnormalities.   Patient does have a small very superficial abrasion on his left elbow which was cleaned by nursing staff. I spoke with Dr. Ihor Gully on-call for orthopedics who recommended posterior arm splint and outpatient follow-up. This information is given to patient and put on his discharge paper.  Splint is placed.  Given patients age I am very concerned that a narcotic medication may make him off balance and more likely to fall.  While this was mechanical fall I am concerned narcotics would make his fall risk very high.  OTC medications as needed for pain.  He has no evidence of compartment syndrome and appears comfortable.   Return precautions were discussed with patient who states their understanding.  At the time of discharge patient denied any unaddressed complaints or concerns.  Patient is agreeable for discharge home.  Note: Portions of this report may have been transcribed using voice recognition software. Every effort was made to ensure accuracy; however, inadvertent computerized transcription errors may be present   Final Clinical Impression(s) / ED Diagnoses Final diagnoses:  Closed fracture of olecranon process of left ulna, initial encounter  Abrasion    Rx / DC Orders ED Discharge Orders    None       Lorin Glass, PA-C 05/14/20 0004    Gareth Morgan, MD 05/15/20 [phone]

## 2020-05-15 ENCOUNTER — Other Ambulatory Visit: Payer: Self-pay | Admitting: Family Medicine

## 2020-05-16 ENCOUNTER — Other Ambulatory Visit: Payer: Self-pay

## 2020-05-16 NOTE — Telephone Encounter (Signed)
Please see message and advise.  Thank you. Last OV 03/21/20  w/Nche Last fill 04/05/20  Historical Provider

## 2020-05-17 ENCOUNTER — Ambulatory Visit (INDEPENDENT_AMBULATORY_CARE_PROVIDER_SITE_OTHER): Payer: Medicare HMO | Admitting: Family

## 2020-05-17 ENCOUNTER — Encounter: Payer: Self-pay | Admitting: Family

## 2020-05-17 VITALS — BP 100/68 | HR 106 | Temp 97.4°F | Ht 70.0 in | Wt 154.2 lb

## 2020-05-17 DIAGNOSIS — I952 Hypotension due to drugs: Secondary | ICD-10-CM

## 2020-05-17 DIAGNOSIS — M6281 Muscle weakness (generalized): Secondary | ICD-10-CM

## 2020-05-17 DIAGNOSIS — Z9181 History of falling: Secondary | ICD-10-CM

## 2020-05-17 DIAGNOSIS — E440 Moderate protein-calorie malnutrition: Secondary | ICD-10-CM

## 2020-05-17 DIAGNOSIS — N189 Chronic kidney disease, unspecified: Secondary | ICD-10-CM

## 2020-05-17 DIAGNOSIS — S42402A Unspecified fracture of lower end of left humerus, initial encounter for closed fracture: Secondary | ICD-10-CM

## 2020-05-17 LAB — BASIC METABOLIC PANEL
BUN: 25 mg/dL — ABNORMAL HIGH (ref 6–23)
CO2: 23 mEq/L (ref 19–32)
Calcium: 7.7 mg/dL — ABNORMAL LOW (ref 8.4–10.5)
Chloride: 106 mEq/L (ref 96–112)
Creatinine, Ser: 1.77 mg/dL — ABNORMAL HIGH (ref 0.40–1.50)
GFR: 32.54 mL/min — ABNORMAL LOW (ref >60.00)
Glucose, Bld: 122 mg/dL — ABNORMAL HIGH (ref 70–99)
Potassium: 4 mEq/L (ref 3.5–5.1)
Sodium: 137 mEq/L (ref 135–145)

## 2020-05-17 LAB — CBC WITH DIFFERENTIAL/PLATELET
Basophils Absolute: 0 10*3/uL (ref 0.0–0.1)
Basophils Relative: 0.3 % (ref 0.0–3.0)
Eosinophils Absolute: 0 10*3/uL (ref 0.0–0.7)
Eosinophils Relative: 0 % (ref 0.0–5.0)
HCT: 31 % — ABNORMAL LOW (ref 39.0–52.0)
Hemoglobin: 10.1 g/dL — ABNORMAL LOW (ref 13.0–17.0)
Lymphocytes Relative: 8.8 % — ABNORMAL LOW (ref 12.0–46.0)
Lymphs Abs: 0.6 10*3/uL — ABNORMAL LOW (ref 0.7–4.0)
MCHC: 32.5 g/dL (ref 30.0–36.0)
MCV: 77.8 fl — ABNORMAL LOW (ref 78.0–100.0)
Monocytes Absolute: 0.7 10*3/uL (ref 0.1–1.0)
Monocytes Relative: 11.4 % (ref 3.0–12.0)
Neutro Abs: 5.2 10*3/uL (ref 1.4–7.7)
Neutrophils Relative %: 79.5 % — ABNORMAL HIGH (ref 43.0–77.0)
Platelets: 110 10*3/uL — ABNORMAL LOW (ref 150.0–400.0)
RBC: 3.99 Mil/uL — ABNORMAL LOW (ref 4.22–5.81)
RDW: 15.5 % (ref 11.5–15.5)
WBC: 6.6 10*3/uL (ref 4.0–10.5)

## 2020-05-17 MED ORDER — LOSARTAN POTASSIUM 50 MG PO TABS: 50.0000 mg | ORAL_TABLET | Freq: Every day | ORAL | 0 refills | Status: DC

## 2020-05-17 NOTE — Patient Instructions (Signed)

## 2020-05-17 NOTE — Progress Notes (Signed)
Established Patient Office Visit  Subjective:  Patient ID: [name], male    DOB: 1923-12-31  Age: 86 y.o. MRN: [id number]  CC:  Chief Complaint  Patient presents with  . Hospitalization Follow-up    Hospital follow up on on fractured left elbow. Family would like for patient to have a walker.     HPI [name] presents for a hospital follow-up of a fall that occurred 05/13/20. Patient sustained a mechanical fall down 1 step and landed on his left elbow. He had an xray done and was found to have a fracture. Patient is scheduled to see orthopedics Jan 4th. Currently in a splint. CT scan of the head was normal. His granddaughter accompanies him today and she is concerned that he is sleeping more and eating less. Patient feels the Gabapentin s making his sick. Also concerns that he is not swallowing meats when he eats them. Continues to drink well. Granddaughter would like to have home health come out for an evaluation. Would like a walker and a showerchair.   Past Medical History:  Diagnosis Date  . Arthritis   . Atrial fibrillation ([name])   . CKD (chronic kidney disease)   . History of hiatal hernia   . Hx SBO    Last 2013 all treated conservatively  . Hypertension   . Presence of permanent cardiac pacemaker   . Prostate cancer ([name])   . Stroke [facility]) 2017   left facial drooping noted 08/14/2016  . Tachycardia-bradycardia syndrome ([name])   . Type II diabetes mellitus ([name])   . Ventral hernia    x3    Past Surgical History:  Procedure Laterality Date  . ABDOMINAL AORTOGRAM W/LOWER EXTREMITY N/A 08/07/2016   Procedure: Abdominal Aortogram w/Lower Extremity;  Surgeon: Iran Ouch, MD;  Location: [facility] INVASIVE CV LAB;  Service: Cardiovascular;  Laterality: N/A;  . CHOLECYSTECTOMY OPEN  03/31/2001   Dr Wiliam Ke  . COLON SURGERY    . EXPLORATORY LAPAROTOMY  08/2004   Hattie Perch 10/01/2010  . HEMORRHOID SURGERY    . HERNIA REPAIR    . HIATAL HERNIA REPAIR  2002  .  INCISIONAL HERNIA REPAIR  08/2004   Hattie Perch 10/01/2010  . INSERT / REPLACE / REMOVE PACEMAKER    . PERIPHERAL VASCULAR BALLOON ANGIOPLASTY  08/07/2016   Procedure: Peripheral Vascular Balloon Angioplasty;  Surgeon: Iran Ouch, MD;  Location: [facility] INVASIVE CV LAB;  Service: Cardiovascular;;  left popliteal artery  . PERIPHERAL VASCULAR INTERVENTION  08/14/2016   popliteal artery/notes 08/14/2016  . PERIPHERAL VASCULAR INTERVENTION Left 08/14/2016   Procedure: Peripheral Vascular Intervention;  Surgeon: Iran Ouch, MD;  Location: [facility] CV LAB;  Service: Cardiovascular;  Laterality: Left;  popliteal artery  . PERMANENT PACEMAKER GENERATOR CHANGE N/A 04/06/2012   Procedure: PERMANENT PACEMAKER GENERATOR CHANGE;  Surgeon: Marinus Maw, MD;  Location: [facility] CATH LAB;  Service: Cardiovascular;  Laterality: N/A;  . SMALL INTESTINE SURGERY  09/16/2004   SBO resection, VH repair    Family History  Problem Relation Age of Onset  . Pneumonia Father   . Stroke Brother   . Stroke Sister   . Cancer Brother        type unknown    Social History   Socioeconomic History  . Marital status: Widowed    Spouse name: Not on file  . Number of children: Not on file  . Years of education: Not on file  . Highest education level: Not on file  Occupational  History  . Not on file  Tobacco Use  . Smoking status: Former Smoker    Packs/day: 1.50    Years: 15.00    Pack years: 22.50    Quit date: 04/16/1955    Years since quitting: 65.1  . Smokeless tobacco: Former Systems developer    Types: [place] date: 08/17/1952  Substance and Sexual Activity  . Alcohol use: No    Comment: 08/14/2016 "drank beer when I was a teenager"  . Drug use: No  . Sexual activity: Not on file  Other Topics Concern  . Not on file  Social History Narrative   Lives alone   Social Determinants of Health   Financial Resource Strain: Not on file  Food Insecurity: Not on file  Transportation Needs: Not on file  Physical  Activity: Not on file  Stress: Not on file  Social Connections: Not on file  Intimate Partner Violence: Not on file    Outpatient Medications Prior to Visit  Medication Sig Dispense Refill  . Cholecalciferol (VITAMIN D-3) 125 MCG (5000 UT) TABS Take by mouth.    . ferrous sulfate (FEROSUL) 325 (65 FE) MG tablet Take one by mouth every other day. 90 tablet 2  . latanoprost (XALATAN) 0.005 % ophthalmic solution Place 1 drop into both eyes nightly.    . pantoprazole (PROTONIX) 40 MG tablet TAKE 1 TABLET(40 MG) BY MOUTH DAILY 90 tablet 1  . XARELTO 15 MG TABS tablet TAKE 1 TABLET(15 MG) BY MOUTH DAILY WITH SUPPER 30 tablet 3  . gabapentin (NEURONTIN) 100 MG capsule Take 100 mg by mouth at bedtime.    Marland Kitchen atorvastatin (LIPITOR) 10 MG tablet Take 10 mg by mouth daily.    . metoprolol succinate (TOPROL-XL) 25 MG 24 hr tablet TAKE 1 TABLET(25 MG) BY MOUTH EVERY EVENING (Patient not taking: Reported on 05/17/2020) 90 tablet 1  . mirtazapine (REMERON) 7.5 MG tablet Take 1 tablet (7.5 mg total) by mouth at bedtime. (Patient not taking: Reported on 05/17/2020) 30 tablet 2  . niacin 500 MG tablet Take 500 mg by mouth at bedtime. (Patient not taking: Reported on 05/17/2020)    . vitamin B-12 (CYANOCOBALAMIN) 1000 MCG tablet Take 1 tablet (1,000 mcg total) by mouth daily. (Patient not taking: Reported on 05/17/2020) 90 tablet 3  . zinc gluconate 50 MG tablet Take 50 mg by mouth daily. (Patient not taking: Reported on 05/17/2020)    . losartan (COZAAR) 100 MG tablet Take 1 tablet (100 mg total) by mouth daily. 90 tablet 3   No facility-administered medications prior to visit.    No Known Allergies  ROS Review of Systems  Constitutional: Positive for appetite change.  HENT: Negative.   Respiratory: Negative.  Negative for cough and shortness of breath.   Cardiovascular: Positive for leg swelling. Negative for chest pain and palpitations.  Gastrointestinal: Negative.   Endocrine: Negative.    Genitourinary: Negative.   Musculoskeletal: Positive for gait problem and joint swelling.       Left elbow fracture  Skin: Negative.   Neurological: Positive for weakness. Negative for dizziness, light-headedness, numbness and headaches.  Hematological: Negative.   Psychiatric/Behavioral: Negative.       Objective:    Physical Exam Constitutional:      Appearance: Normal appearance. He is normal weight.  HENT:     Head: Normocephalic and atraumatic.  Cardiovascular:     Rate and Rhythm: Normal rate. Rhythm irregular.     Pulses: Normal pulses.  Heart sounds: Normal heart sounds.  Pulmonary:     Breath sounds: Normal breath sounds.  Abdominal:     General: Abdomen is flat.     Palpations: Abdomen is soft.  Musculoskeletal:        General: Swelling present.     Cervical back: Normal range of motion and neck supple.     Comments: Walks guarded and slow. 2+ edema noted to the ankles. Left elbow in a splint.   Skin:    General: Skin is warm and dry.  Neurological:     General: No focal deficit present.     Mental Status: He is alert and oriented to person, place, and time.  Psychiatric:        Mood and Affect: Mood normal.     BP 100/68   Pulse (!) 106   Temp (!) 97.4 F (36.3 C) (Temporal)   Ht 5\' 10"  (1.778 m)   Wt 154 lb 3.2 oz (69.9 kg)   SpO2 96%   BMI 22.13 kg/m  Wt Readings from Last 3 Encounters:  05/17/20 154 lb 3.2 oz (69.9 kg)  05/13/20 120 lb (54.4 kg)  04/05/20 158 lb 9.6 oz (71.9 kg)     Health Maintenance Due  Topic Date Due  . FOOT EXAM  Never done  . OPHTHALMOLOGY EXAM  Never done  . URINE MICROALBUMIN  Never done  . PNA vac Low Risk Adult (1 of 2 - PCV13) Never done  . INFLUENZA VACCINE  12/19/2019  . COVID-19 Vaccine (3 - Booster for Pfizer series) 03/02/2020    There are no preventive care reminders to display for this patient.  Lab Results  Component Value Date   TSH 2.80 03/21/2020   Lab Results  Component Value Date    WBC 6.2 03/21/2020   HGB 10.3 (L) 03/21/2020   HCT 31.5 (L) 03/21/2020   MCV 81.4 03/21/2020   PLT 158.0 03/21/2020   Lab Results  Component Value Date   NA 142 03/21/2020   K 4.9 03/21/2020   CO2 23 03/21/2020   GLUCOSE 99 03/21/2020   BUN 17 03/21/2020   CREATININE 1.56 (H) 03/21/2020   BILITOT 0.6 12/05/2017   ALKPHOS 57 12/05/2017   AST 20 12/05/2017   ALT 13 12/05/2017   PROT 6.3 (L) 12/05/2017   ALBUMIN 3.3 (L) 12/05/2017   CALCIUM 7.7 (L) 03/21/2020   ANIONGAP 6 12/05/2017   GFR 37.90 (L) 03/21/2020   Lab Results  Component Value Date   CHOL 122 07/23/2015   Lab Results  Component Value Date   HDL 43 07/23/2015   Lab Results  Component Value Date   LDLCALC 68 07/23/2015   Lab Results  Component Value Date   TRIG 55 07/23/2015   Lab Results  Component Value Date   CHOLHDL 2.8 07/23/2015   Lab Results  Component Value Date   HGBA1C 7.2 (H) 01/27/2020      Assessment & Plan:   Problem List Items Addressed This Visit    Malnutrition of moderate degree   Relevant Orders   Basic Metabolic Panel (BMET)   POC Urinalysis Dipstick   CBC w/Diff   Ambulatory referral to [place] rolling    Other Visit Diagnoses    Generalized muscle weakness    -  Primary   Relevant Orders   Basic Metabolic Panel (BMET)   POC Urinalysis Dipstick   CBC w/Diff   Ambulatory referral to [place]  Hypotension due to drugs       Relevant Medications   atorvastatin (LIPITOR) 10 MG tablet   losartan (COZAAR) 50 MG tablet   Other Relevant Orders   Basic Metabolic Panel (BMET)   POC Urinalysis Dipstick   CBC w/Diff   Ambulatory referral to [place]   At high risk for injury related to fall       Relevant Orders   Basic Metabolic Panel (BMET)   POC Urinalysis Dipstick   CBC w/Diff   Ambulatory referral to [employer]   Elbow fracture, left, closed, initial encounter       Relevant Orders   Basic Metabolic Panel (BMET)   POC  Urinalysis Dipstick   CBC w/Diff   Ambulatory referral to [place] ordered this encounter  Medications  . losartan (COZAAR) 50 MG tablet    Sig: Take 1 tablet (50 mg total) by mouth daily.    Dispense:  90 tablet    Refill:  0    D/C LOSARTAN HCT    Decrease Losartan to 50 mg to increase blood pressure. D/c gabapentin. Obtain labs and UA today. Home health ordered. Market researcher ordered.   Follow-up: 3 weeks    Kennyth Arnold, FNP

## 2020-05-22 ENCOUNTER — Telehealth: Payer: Self-pay | Admitting: Family

## 2020-05-22 NOTE — Telephone Encounter (Signed)
Caller Name: [name], granddaughter Call back phone #: [phone]  Reason for Call: She moved in with pt on Friday to help care for him. She said pt is hesitant to get into the shower and wondering if there is a stool that he can get in on dry floor outside of shower and swivel into shower.  Pt also having difficult time getting out of easy chair and considering a lift chair. Please advise on orders. I was not sure where DME orders 05/17/20 were sent.

## 2020-05-22 NOTE — Telephone Encounter (Signed)
Please advise message below. Okay to put in referral for medical supplies.

## 2020-05-23 DIAGNOSIS — M25522 Pain in left elbow: Secondary | ICD-10-CM | POA: Diagnosis not present

## 2020-05-24 ENCOUNTER — Emergency Department (HOSPITAL_COMMUNITY)
Admission: EM | Admit: 2020-05-24 | Discharge: 2020-05-24 | Disposition: A | Discharge status: Home / Self Care | Payer: Medicare HMO | Attending: Emergency Medicine | Admitting: Emergency Medicine

## 2020-05-24 ENCOUNTER — Other Ambulatory Visit: Payer: Self-pay

## 2020-05-24 ENCOUNTER — Emergency Department (HOSPITAL_COMMUNITY): Payer: Medicare HMO

## 2020-05-24 ENCOUNTER — Encounter: Payer: Self-pay | Admitting: Family Medicine

## 2020-05-24 ENCOUNTER — Encounter (HOSPITAL_COMMUNITY): Payer: Self-pay

## 2020-05-24 DIAGNOSIS — R531 Weakness: Secondary | ICD-10-CM | POA: Diagnosis not present

## 2020-05-24 DIAGNOSIS — I5032 Chronic diastolic (congestive) heart failure: Secondary | ICD-10-CM | POA: Insufficient documentation

## 2020-05-24 DIAGNOSIS — Z87891 Personal history of nicotine dependence: Secondary | ICD-10-CM | POA: Insufficient documentation

## 2020-05-24 DIAGNOSIS — Z950 Presence of cardiac pacemaker: Secondary | ICD-10-CM | POA: Insufficient documentation

## 2020-05-24 DIAGNOSIS — Z8546 Personal history of malignant neoplasm of prostate: Secondary | ICD-10-CM | POA: Diagnosis not present

## 2020-05-24 DIAGNOSIS — I130 Hypertensive heart and chronic kidney disease with heart failure and stage 1 through stage 4 chronic kidney disease, or unspecified chronic kidney disease: Secondary | ICD-10-CM | POA: Insufficient documentation

## 2020-05-24 DIAGNOSIS — S0990XA Unspecified injury of head, initial encounter: Secondary | ICD-10-CM

## 2020-05-24 DIAGNOSIS — E1140 Type 2 diabetes mellitus with diabetic neuropathy, unspecified: Secondary | ICD-10-CM | POA: Diagnosis not present

## 2020-05-24 DIAGNOSIS — N189 Chronic kidney disease, unspecified: Secondary | ICD-10-CM | POA: Diagnosis not present

## 2020-05-24 DIAGNOSIS — E1122 Type 2 diabetes mellitus with diabetic chronic kidney disease: Secondary | ICD-10-CM | POA: Diagnosis not present

## 2020-05-24 DIAGNOSIS — I10 Essential (primary) hypertension: Secondary | ICD-10-CM | POA: Diagnosis not present

## 2020-05-24 DIAGNOSIS — Z79899 Other long term (current) drug therapy: Secondary | ICD-10-CM | POA: Insufficient documentation

## 2020-05-24 DIAGNOSIS — X58XXXA Exposure to other specified factors, initial encounter: Secondary | ICD-10-CM | POA: Diagnosis not present

## 2020-05-24 DIAGNOSIS — R4781 Slurred speech: Secondary | ICD-10-CM | POA: Diagnosis not present

## 2020-05-24 LAB — BASIC METABOLIC PANEL
Anion gap: 8 (ref 5–15)
BUN: 18 mg/dL (ref 8–23)
CO2: 23 mmol/L (ref 22–32)
Calcium: 8.4 mg/dL — ABNORMAL LOW (ref 8.9–10.3)
Chloride: 112 mmol/L — ABNORMAL HIGH (ref 98–111)
Creatinine, Ser: 1.67 mg/dL — ABNORMAL HIGH (ref 0.61–1.24)
GFR, Estimated: 38 mL/min — ABNORMAL LOW (ref >60)
Glucose, Bld: 118 mg/dL — ABNORMAL HIGH (ref 70–99)
Potassium: 3.7 mmol/L (ref 3.5–5.1)
Sodium: 143 mmol/L (ref 135–145)

## 2020-05-24 LAB — CBC WITH DIFFERENTIAL/PLATELET
Abs Immature Granulocytes: 0.05 10*3/uL (ref 0.00–0.07)
Basophils Absolute: 0 10*3/uL (ref 0.0–0.1)
Basophils Relative: 0 %
Eosinophils Absolute: 0 10*3/uL (ref 0.0–0.5)
Eosinophils Relative: 0 %
HCT: 31.7 % — ABNORMAL LOW (ref 39.0–52.0)
Hemoglobin: 10.3 g/dL — ABNORMAL LOW (ref 13.0–17.0)
Immature Granulocytes: 1 %
Lymphocytes Relative: 16 %
Lymphs Abs: 0.9 10*3/uL (ref 0.7–4.0)
MCH: 24.9 pg — ABNORMAL LOW (ref 26.0–34.0)
MCHC: 32.5 g/dL (ref 30.0–36.0)
MCV: 76.8 fL — ABNORMAL LOW (ref 80.0–100.0)
Monocytes Absolute: 0.6 10*3/uL (ref 0.1–1.0)
Monocytes Relative: 11 %
Neutro Abs: 3.9 10*3/uL (ref 1.7–7.7)
Neutrophils Relative %: 72 %
Platelets: 232 10*3/uL (ref 150–400)
RBC: 4.13 MIL/uL — ABNORMAL LOW (ref 4.22–5.81)
RDW: 15.6 % — ABNORMAL HIGH (ref 11.5–15.5)
WBC: 5.5 10*3/uL (ref 4.0–10.5)
nRBC: 0 % (ref 0.0–0.2)

## 2020-05-24 NOTE — ED Triage Notes (Signed)
Pt arrived via walk in, per pt granddaughter pt had fall 12/25, has had slurred speech since. Was going to have a repeat head CT outpatient and told to proceed to ED for repeat head CT to rule out any acute changes. Pt granddaughter denies any change in neurological status since fall.

## 2020-05-24 NOTE — ED Notes (Signed)
Discharged w/ granddaughter no concerns at this time AVS reviewed.

## 2020-05-24 NOTE — ED Provider Notes (Signed)
[facility] DEPT Provider Note   CSN: [id number] Arrival date & time: 05/24/20  1411     History No chief complaint on file.   MEMPHIS SHREINER is a 87 y.o. male.  Chief concern is increased weakness and speech changes and concern for possibly in the brain.  Family states that the patient fell about 11 days ago.  Patient is otherwise on Xarelto.  Since that fall 11 days ago they were concerned that he may have some bleeding in his brain.  They noticed some changes in speech but cannot quantify exactly what it is.  Patient otherwise denies any new numbness or weakness and is ambulatory with a walker at baseline.  Denies any headache or neck pain or chest pain or abdominal pain.  No back pain noted.        Past Medical History:  Diagnosis Date  . Arthritis   . Atrial fibrillation ([place])   . CKD (chronic kidney disease)   . History of hiatal hernia   . Hx SBO    Last 2013 all treated conservatively  . Hypertension   . Presence of permanent cardiac pacemaker   . Prostate cancer ([place])   . Stroke [facility]) 2017   left facial drooping noted 08/14/2016  . Tachycardia-bradycardia syndrome ([place])   . Type II diabetes mellitus ([place])   . Ventral hernia    x3    Patient Active Problem List   Diagnosis Date Noted  . B12 deficiency 01/27/2020  . Chronic diastolic heart failure ([place]) 03/09/2019  . Pain in both feet 11/17/2018  . Neuropathy 10/09/2018  . Psychophysiological insomnia 10/09/2018  . Constipation by delayed colonic transit 06/18/2018  . Syncope 12/03/2017  . Dehydration   . AKI (acute kidney injury) ([place]) 12/01/2017  . Iron deficiency anemia 11/25/2017  . Decreased appetite 10/16/2017  . Hearing difficulty of both ears 10/16/2017  . Allergic rhinitis due to pollen 09/03/2017  . Ventricular tachycardia ([place]) 01/14/2017  . Malnutrition of moderate degree 01/13/2017  . DOE (dyspnea on exertion) 01/12/2017  . Small bowel obstruction ([place])  01/12/2017  . PAD (peripheral artery disease) ([place]) 08/14/2016  . CVA (cerebral infarction) 07/23/2015  . Microcytic anemia 07/23/2015  . Stroke (cerebrum) ([place]) 07/23/2015  . Chronic kidney disease 07/23/2015  . Acute CVA (cerebrovascular accident) ([place])   . SBO (small bowel obstruction) ([place]) 02/14/2012  . Abdominal pain 02/14/2012  . Nausea & vomiting 02/14/2012  . PPM-Medtronic 03/13/2010  . Diabetes ([place]) 11/14/2008  . Atrial fibrillation ([place]) 11/14/2008  . BRADYCARDIA-TACHYCARDIA SYNDROME 11/14/2008    Past Surgical History:  Procedure Laterality Date  . ABDOMINAL AORTOGRAM W/LOWER EXTREMITY N/A 08/07/2016   Procedure: Abdominal Aortogram w/Lower Extremity;  Surgeon: Wellington Hampshire, MD;  Location: [facility] CV LAB;  Service: Cardiovascular;  Laterality: N/A;  . CHOLECYSTECTOMY OPEN  03/31/2001   Dr Lindon Romp  . COLON SURGERY    . EXPLORATORY LAPAROTOMY  08/2004   Archie Endo 10/01/2010  . HEMORRHOID SURGERY    . HERNIA REPAIR    . HIATAL HERNIA REPAIR  2002  . INCISIONAL HERNIA REPAIR  08/2004   Archie Endo 10/01/2010  . INSERT / REPLACE / REMOVE PACEMAKER    . PERIPHERAL VASCULAR BALLOON ANGIOPLASTY  08/07/2016   Procedure: Peripheral Vascular Balloon Angioplasty;  Surgeon: Wellington Hampshire, MD;  Location: [place] CV LAB;  Service: Cardiovascular;;  left popliteal artery  . PERIPHERAL VASCULAR INTERVENTION  08/14/2016   popliteal artery/notes 08/14/2016  . PERIPHERAL VASCULAR INTERVENTION Left 08/14/2016  Procedure: Peripheral Vascular Intervention;  Surgeon: Wellington Hampshire, MD;  Location: [facility] CV LAB;  Service: Cardiovascular;  Laterality: Left;  popliteal artery  . PERMANENT PACEMAKER GENERATOR CHANGE N/A 04/06/2012   Procedure: PERMANENT PACEMAKER GENERATOR CHANGE;  Surgeon: Evans Lance, MD;  Location: [facility] CATH LAB;  Service: Cardiovascular;  Laterality: N/A;  . SMALL INTESTINE SURGERY  09/16/2004   SBO resection, VH repair       Family History  Problem  Relation Age of Onset  . Pneumonia Father   . Stroke Brother   . Stroke Sister   . Cancer Brother        type unknown    Social History   Tobacco Use  . Smoking status: Former Smoker    Packs/day: 1.50    Years: 15.00    Pack years: 22.50    Quit date: 04/16/1955    Years since quitting: 65.1  . Smokeless tobacco: Former Systems developer    Types: Chew    Quit date: 08/17/1952  Substance Use Topics  . Alcohol use: No    Comment: 08/14/2016 "drank beer when I was a teenager"  . Drug use: No    Home Medications Prior to Admission medications   Medication Sig Start Date End Date Taking? Authorizing Provider  atorvastatin (LIPITOR) 10 MG tablet Take 10 mg by mouth daily. 05/09/20   [provider]  Cholecalciferol (VITAMIN D-3) 125 MCG (5000 UT) TABS Take by mouth.    [provider]  ferrous sulfate (FEROSUL) 325 (65 FE) MG tablet Take one by mouth every other day. 01/28/20   [name], MD  latanoprost (XALATAN) 0.005 % ophthalmic solution Place 1 drop into both eyes nightly. 05/03/20   [provider]  losartan (COZAAR) 50 MG tablet Take 1 tablet (50 mg total) by mouth daily. 05/17/20 08/15/20  Dutch Quint B, FNP  metoprolol succinate (TOPROL-XL) 25 MG 24 hr tablet TAKE 1 TABLET(25 MG) BY MOUTH EVERY EVENING Patient not taking: Reported on 05/17/2020 03/10/20   [name], MD  mirtazapine (REMERON) 7.5 MG tablet Take 1 tablet (7.5 mg total) by mouth at bedtime. Patient not taking: Reported on 05/17/2020 02/29/20   [name], MD  niacin 500 MG tablet Take 500 mg by mouth at bedtime. Patient not taking: Reported on 05/17/2020    [provider]  pantoprazole (PROTONIX) 40 MG tablet TAKE 1 TABLET(40 MG) BY MOUTH DAILY 12/07/19   [name], MD  vitamin B-12 (CYANOCOBALAMIN) 1000 MCG tablet Take 1 tablet (1,000 mcg total) by mouth daily. Patient not taking: Reported on 05/17/2020 01/28/20   [name], MD  XARELTO 15 MG TABS tablet TAKE 1 TABLET(15 MG) BY MOUTH DAILY WITH SUPPER 04/20/20   [name], MD  zinc gluconate 50 MG tablet Take 50 mg by mouth daily. Patient not taking: Reported on 05/17/2020    [provider]    Allergies    Patient has no known allergies.  Review of Systems   Review of Systems  Constitutional: Negative for fever.  HENT: Negative for ear pain and sore throat.   Eyes: Negative for pain.  Respiratory: Negative for cough.   Cardiovascular: Negative for chest pain.  Gastrointestinal: Negative for abdominal pain.  Genitourinary: Negative for flank pain.  Musculoskeletal: Negative for back pain.  Skin: Negative for color change and rash.  Neurological: Negative for syncope.  All other systems reviewed and are negative.   Physical Exam Updated Vital Signs BP  135/86 (BP Location: Right Wrist)   Pulse 94   Temp 97.9 F (36.6 C) (Oral)   Resp (!) 22   SpO2 98%   Physical Exam Constitutional:      General: He is not in acute distress.    Appearance: He is well-developed.  HENT:     Head: Normocephalic.     Nose: Nose normal.  Eyes:     Extraocular Movements: Extraocular movements intact.  Cardiovascular:     Rate and Rhythm: Normal rate.  Pulmonary:     Effort: Pulmonary effort is normal.  Skin:    Coloration: Skin is not jaundiced.  Neurological:     Mental Status: He is alert. Mental status is at baseline.     Cranial Nerves: No cranial nerve deficit.     Motor: No weakness.     ED Results / Procedures / Treatments   Labs (all labs ordered are listed, but only abnormal results are displayed) Labs Reviewed  BASIC METABOLIC PANEL - Abnormal; Notable for the following components:      Result Value   Chloride 112 (*)    Glucose, Bld 118 (*)    Creatinine, Ser 1.67 (*)    Calcium 8.4 (*)    GFR, Estimated 38 (*)    All other components within normal limits  CBC WITH DIFFERENTIAL/PLATELET - Abnormal; Notable  for the following components:   RBC 4.13 (*)    Hemoglobin 10.3 (*)    HCT 31.7 (*)    MCV 76.8 (*)    MCH 24.9 (*)    RDW 15.6 (*)    All other components within normal limits    EKG None  Radiology CT Head Wo Contrast  Result Date: 05/24/2020 CLINICAL DATA:  Fall on Christmas Day with persistent slurred speech EXAM: CT HEAD WITHOUT CONTRAST TECHNIQUE: Contiguous axial images were obtained from the base of the skull through the vertex without intravenous contrast. COMPARISON:  Head CT 05/13/2020 FINDINGS: Brain: No evidence of acute infarction, hemorrhage, hydrocephalus, extra-axial collection or mass lesion/mass effect. Age related cortical and central atrophy with mild ex vacuo dilatation of the lateral ventricles. Confluent periventricular white matter hypoattenuation consistent with chronic microvascular ischemic white matter disease. Vascular: No hyperdense vessel or unexpected calcification. Skull: Normal. Negative for fracture or focal lesion. Sinuses/Orbits: No acute finding. Other: None. IMPRESSION: 1. No acute intracranial abnormality. No significant interval change compared to recent prior imaging. 2. Stable atrophy and chronic microvascular ischemic white matter disease. Electronically Signed   By: Malachy Moan M.D.   On: 05/24/2020 16:17    Procedures Procedures (including critical care time)  Medications Ordered in ED Medications - No data to display  ED Course  I have reviewed the triage vital signs and the nursing notes.  Pertinent labs & imaging results that were available during my care of the patient were reviewed by me and considered in my medical decision making (see chart for details).    MDM Rules/Calculators/A&P                          The brain shows no acute findings per radiology.  Patient be advised outpatient follow-up with his doctors again in 2 or 3 days.  I discussed with family with the patient still should continue on Xarelto given his been  having multiple falls over the course of last month.  I advised due to his increased fall risk they should consider talking with her doctor about  possibly stopping Xarelto.  Family will call patient's doctor tomorrow and discuss this.   Final Clinical Impression(s) / ED Diagnoses Final diagnoses:  Injury of head, initial encounter    Rx / DC Orders ED Discharge Orders    None       Luna Fuse, MD 05/24/20 [id number]

## 2020-05-24 NOTE — Discharge Instructions (Addendum)
Your CT scan did not show any bleeding.  Your blood tests were otherwise normal.  Call your doctor tomorrow and discuss whether you can stop Xarelto due to your increased falls.  Return to the ER if you have fevers pain worsening symptoms new numbness weakness or any additional concerns.

## 2020-05-31 DIAGNOSIS — E440 Moderate protein-calorie malnutrition: Secondary | ICD-10-CM | POA: Diagnosis not present

## 2020-05-31 DIAGNOSIS — S52032D Displaced fracture of olecranon process with intraarticular extension of left ulna, subsequent encounter for closed fracture with routine healing: Secondary | ICD-10-CM | POA: Diagnosis not present

## 2020-05-31 DIAGNOSIS — E1151 Type 2 diabetes mellitus with diabetic peripheral angiopathy without gangrene: Secondary | ICD-10-CM | POA: Diagnosis not present

## 2020-05-31 DIAGNOSIS — M6281 Muscle weakness (generalized): Secondary | ICD-10-CM | POA: Diagnosis not present

## 2020-05-31 DIAGNOSIS — N189 Chronic kidney disease, unspecified: Secondary | ICD-10-CM | POA: Diagnosis not present

## 2020-05-31 DIAGNOSIS — I130 Hypertensive heart and chronic kidney disease with heart failure and stage 1 through stage 4 chronic kidney disease, or unspecified chronic kidney disease: Secondary | ICD-10-CM | POA: Diagnosis not present

## 2020-05-31 DIAGNOSIS — I5032 Chronic diastolic (congestive) heart failure: Secondary | ICD-10-CM | POA: Diagnosis not present

## 2020-05-31 DIAGNOSIS — E1122 Type 2 diabetes mellitus with diabetic chronic kidney disease: Secondary | ICD-10-CM | POA: Diagnosis not present

## 2020-05-31 DIAGNOSIS — I952 Hypotension due to drugs: Secondary | ICD-10-CM | POA: Diagnosis not present

## 2020-06-07 DIAGNOSIS — M25522 Pain in left elbow: Secondary | ICD-10-CM | POA: Diagnosis not present

## 2020-06-09 DIAGNOSIS — E440 Moderate protein-calorie malnutrition: Secondary | ICD-10-CM | POA: Diagnosis not present

## 2020-06-09 DIAGNOSIS — I952 Hypotension due to drugs: Secondary | ICD-10-CM | POA: Diagnosis not present

## 2020-06-09 DIAGNOSIS — M6281 Muscle weakness (generalized): Secondary | ICD-10-CM | POA: Diagnosis not present

## 2020-06-09 DIAGNOSIS — E1122 Type 2 diabetes mellitus with diabetic chronic kidney disease: Secondary | ICD-10-CM | POA: Diagnosis not present

## 2020-06-09 DIAGNOSIS — E1151 Type 2 diabetes mellitus with diabetic peripheral angiopathy without gangrene: Secondary | ICD-10-CM | POA: Diagnosis not present

## 2020-06-09 DIAGNOSIS — N189 Chronic kidney disease, unspecified: Secondary | ICD-10-CM | POA: Diagnosis not present

## 2020-06-09 DIAGNOSIS — I130 Hypertensive heart and chronic kidney disease with heart failure and stage 1 through stage 4 chronic kidney disease, or unspecified chronic kidney disease: Secondary | ICD-10-CM | POA: Diagnosis not present

## 2020-06-09 DIAGNOSIS — I5032 Chronic diastolic (congestive) heart failure: Secondary | ICD-10-CM | POA: Diagnosis not present

## 2020-06-09 DIAGNOSIS — S52032D Displaced fracture of olecranon process with intraarticular extension of left ulna, subsequent encounter for closed fracture with routine healing: Secondary | ICD-10-CM | POA: Diagnosis not present

## 2020-06-14 ENCOUNTER — Encounter: Payer: Self-pay | Admitting: Family Medicine

## 2020-06-19 ENCOUNTER — Ambulatory Visit: Payer: Self-pay | Admitting: Family Medicine

## 2020-06-19 DIAGNOSIS — Z20822 Contact with and (suspected) exposure to covid-19: Secondary | ICD-10-CM | POA: Diagnosis not present

## 2020-06-19 NOTE — [place] Notes (Signed)
Received faxed order from [facility]. Request signature for PT evaluation due to recent falls associated with a left olecranon fracture, related to impaired gait, poor balance and high risk for falls.  Orders signed.

## 2020-06-26 ENCOUNTER — Other Ambulatory Visit: Payer: Self-pay | Admitting: Family Medicine

## 2020-06-26 NOTE — Telephone Encounter (Signed)
Last Ov 05/17/20 Last fill 12/07/19 #90/1

## 2020-06-27 ENCOUNTER — Other Ambulatory Visit: Payer: Self-pay

## 2020-06-27 ENCOUNTER — Encounter: Payer: Self-pay | Admitting: Cardiovascular Disease

## 2020-06-27 ENCOUNTER — Ambulatory Visit: Payer: Medicare HMO | Admitting: Cardiovascular Disease

## 2020-06-27 VITALS — BP 130/80 | HR 66 | Ht 71.0 in | Wt 152.8 lb

## 2020-06-27 DIAGNOSIS — I739 Peripheral vascular disease, unspecified: Secondary | ICD-10-CM | POA: Diagnosis not present

## 2020-06-27 DIAGNOSIS — I482 Chronic atrial fibrillation, unspecified: Secondary | ICD-10-CM

## 2020-06-27 DIAGNOSIS — I10 Essential (primary) hypertension: Secondary | ICD-10-CM

## 2020-06-27 DIAGNOSIS — M79672 Pain in left foot: Secondary | ICD-10-CM | POA: Diagnosis not present

## 2020-06-27 DIAGNOSIS — M79671 Pain in right foot: Secondary | ICD-10-CM

## 2020-06-27 NOTE — Patient Instructions (Addendum)
Medication Instructions:  Continue current medications  *If you need a refill on your cardiac medications before your next appointment, please call your pharmacy*   Lab Work: None Ordered   Testing/Procedures: Your physician has requested that you have a lower extremity arterial duplex. This test is an ultrasound of the arteries in the legs or arms. It looks at arterial blood flow in the legs and arms. Allow one hour for Lower and Upper Arterial scans. There are no restrictions or special instructions  Your physician has requested that you have an ankle brachial index (ABI). During this test an ultrasound and blood pressure cuff are used to evaluate the arteries that supply the arms and legs with blood. Allow thirty minutes for this exam. There are no restrictions or special instructions.   Follow-Up: At [facility], you and your health needs are our priority.  As part of our continuing mission to provide you with exceptional heart care, we have created designated Provider Care Teams.  These Care Teams include your primary Cardiologist (physician) and Advanced Practice Providers (APPs -  Physician Assistants and Nurse Practitioners) who all work together to provide you with the care you need, when you need it.  We recommend signing up for the patient portal called "MyChart".  Sign up information is provided on this After Visit Summary.  MyChart is used to connect with patients for Virtual Visits (Telemedicine).  Patients are able to view lab/test results, encounter notes, upcoming appointments, etc.  Non-urgent messages can be sent to your provider as well.   To learn more about what you can do with MyChart, go to [email or web address].    Your next appointment:   6 month(s)  The format for your next appointment:   In Person  Provider:   You may see Dr Fletcher Anon or one of the following Advanced Practice Providers on your designated Care Team:    Kerin Ransom, PA-C  [place],  [place]  Coletta Memos, [place]

## 2020-06-27 NOTE — Progress Notes (Signed)
Cardiology Office Note   Date:  06/27/2020   ID:  [name], DOB Jun 26, 1923, MRN [id number]  PCP:  Matthew Maw, MD  Cardiologist:  Dr. Lovena Craig   No chief complaint on file.     History of Present Illness: [name] is a 87 y.o. male who is here today for a follow-up visit regarding peripheral arterial disease.The patient has known history of chronic atrial fibrillation on long-term anticoagulation with Xarelto, symptomatic bradycardia status post permanent pacemaker placement, previous stroke one day after running out of Xarelto and diet-controlled diabetes. He was seen in 2018 for ulceration on the left distal fourth toe with painful dark discoloration.  Vascular studies showed normal ABI on the right side and mildly reduced on the left side at 0.93. Duplex showed occlusion of the distal left popliteal artery and tibial peroneal trunk. Angiography  showed no significant aortoiliac disease: Occluded left popliteal artery with reconstitution in the distal TP trunk with patent posterior tibial and peroneal arteries. The patient  underwent successful complex endovascular intervention of the occluded left popliteal artery and TP trunk using a retrograde access via the left posterior tibial artery.  Most recent vascular studies in June of last year showed normal ABI bilaterally.  Duplex showed patent stent in the left popliteal and tibioperoneal trunk with no obstructive disease.  He had significant weight loss last year.  He had a fall in December and injured his left arm.  He had another emergency room visit in January due to weakness.  There was discussion about possibility of stopping Xarelto given recurrent falls.  However, the patient has been steady lately with the help of family members and it was decided to continue anticoagulation for now.  He had Covid recently but recovered.  He had issues with increased discoloration in both legs below the knee around the  ankle area with some blisters.  In addition, he reports worsening foot pain bilaterally.  No open ulceration.  Past Medical History:  Diagnosis Date  . Arthritis   . Atrial fibrillation ([place])   . CKD (chronic kidney disease)   . History of hiatal hernia   . Hx SBO    Last 2013 all treated conservatively  . Hypertension   . Presence of permanent cardiac pacemaker   . Prostate cancer ([place])   . Stroke [facility]) 2017   left facial drooping noted 08/14/2016  . Tachycardia-bradycardia syndrome ([place])   . Type II diabetes mellitus ([place])   . Ventral hernia    x3    Past Surgical History:  Procedure Laterality Date  . ABDOMINAL AORTOGRAM W/LOWER EXTREMITY N/A 08/07/2016   Procedure: Abdominal Aortogram w/Lower Extremity;  Surgeon: Wellington Hampshire, MD;  Location: [place] [facility];  Service: Cardiovascular;  Laterality: N/A;  . CHOLECYSTECTOMY OPEN  03/31/2001   Dr Lindon Romp  . COLON SURGERY    . EXPLORATORY LAPAROTOMY  08/2004   Archie Endo 10/01/2010  . HEMORRHOID SURGERY    . HERNIA REPAIR    . HIATAL HERNIA REPAIR  2002  . INCISIONAL HERNIA REPAIR  08/2004   Archie Endo 10/01/2010  . INSERT / REPLACE / REMOVE PACEMAKER    . PERIPHERAL VASCULAR BALLOON ANGIOPLASTY  08/07/2016   Procedure: Peripheral Vascular Balloon Angioplasty;  Surgeon: Wellington Hampshire, MD;  Location: [place] [facility];  Service: Cardiovascular;;  left popliteal artery  . PERIPHERAL VASCULAR INTERVENTION  08/14/2016   popliteal artery/notes 08/14/2016  . PERIPHERAL VASCULAR INTERVENTION Left 08/14/2016   Procedure: Peripheral  Vascular Intervention;  Surgeon: Wellington Hampshire, MD;  Location: [place] [facility];  Service: Cardiovascular;  Laterality: Left;  popliteal artery  . PERMANENT PACEMAKER GENERATOR CHANGE N/A 04/06/2012   Procedure: PERMANENT PACEMAKER GENERATOR CHANGE;  Surgeon: Evans Lance, MD;  Location: [facility] CATH LAB;  Service: Cardiovascular;  Laterality: N/A;  . SMALL INTESTINE SURGERY  09/16/2004   SBO resection, VH  repair     Current Outpatient Medications  Medication Sig Dispense Refill  . atorvastatin (LIPITOR) 10 MG tablet Take 10 mg by mouth daily.    . Cholecalciferol (VITAMIN D-3) 125 MCG (5000 UT) TABS Take by mouth.    . ferrous sulfate (FEROSUL) 325 (65 FE) MG tablet Take one by mouth every other day. 90 tablet 2  . latanoprost (XALATAN) 0.005 % ophthalmic solution Place 1 drop into both eyes nightly.    Marland Kitchen losartan (COZAAR) 50 MG tablet Take 1 tablet (50 mg total) by mouth daily. 90 tablet 0  . metoprolol succinate (TOPROL-XL) 25 MG 24 hr tablet TAKE 1 TABLET(25 MG) BY MOUTH EVERY EVENING 90 tablet 1  . mirtazapine (REMERON) 7.5 MG tablet Take 1 tablet (7.5 mg total) by mouth at bedtime. 30 tablet 2  . niacin 500 MG tablet Take 500 mg by mouth at bedtime.    . pantoprazole (PROTONIX) 40 MG tablet TAKE 1 TABLET(40 MG) BY MOUTH DAILY 90 tablet 1  . vitamin B-12 (CYANOCOBALAMIN) 1000 MCG tablet Take 1 tablet (1,000 mcg total) by mouth daily. 90 tablet 3  . XARELTO 15 MG TABS tablet TAKE 1 TABLET(15 MG) BY MOUTH DAILY WITH SUPPER 30 tablet 3  . zinc gluconate 50 MG tablet Take 50 mg by mouth daily.     No current facility-administered medications for this visit.    Allergies:   Patient has no known allergies.    Social History:  The patient  reports that he quit smoking about 65 years ago. He has a 22.50 pack-year smoking history. He quit smokeless tobacco use about 67 years ago.  His smokeless tobacco use included chew. He reports that he does not drink alcohol and does not use drugs.   Family History:  The patient's family history includes Cancer in his brother; Pneumonia in his father; Stroke in his brother and sister.    ROS:  Please see the history of present illness.   Otherwise, review of systems are positive for none.   All other systems are reviewed and negative.    PHYSICAL EXAM: VS:  BP 130/80 (BP Location: Right Arm, Patient Position: Sitting)   Pulse 66   Ht 5\' 11"  (1.803  m)   Wt 152 lb 12.8 oz (69.3 kg)   SpO2 97%   BMI 21.31 kg/m  , BMI Body mass index is 21.31 kg/m. GEN: Well nourished, well developed, in no acute distress  HEENT: normal  Neck: no JVD, carotid bruits, or masses Cardiac: Irregularly irregular; no murmurs, rubs, or gallops, mild bilateral swelling with chronic stasis. Respiratory:  clear to auscultation bilaterally, normal work of breathing GI: soft, nontender, nondistended, + BS MS: no deformity or atrophy  Skin: warm and dry, no rash Neuro:  Strength and sensation are intact Psych: euthymic mood, full affect Vascular: Posterior tibial pulses +2 bilaterally  EKG:  EKG is  ordered today. EKG showed atrial fibrillation with ventricular rate of 101 bpm.  Ventricular paced rhythm.   Recent Labs: 03/21/2020: Pro B Natriuretic peptide (BNP) 313.0; TSH 2.80 05/24/2020: BUN 18; Creatinine, Ser 1.67; Hemoglobin  10.3; Platelets 232; Potassium 3.7; Sodium 143    Lipid Panel    Component Value Date/Time   CHOL 122 07/23/2015 1254   TRIG 55 07/23/2015 1254   HDL 43 07/23/2015 1254   CHOLHDL 2.8 07/23/2015 1254   VLDL 11 07/23/2015 1254   LDLCALC 68 07/23/2015 1254      Wt Readings from Last 3 Encounters:  06/27/20 152 lb 12.8 oz (69.3 kg)  05/17/20 154 lb 3.2 oz (69.9 kg)  05/13/20 120 lb (54.4 kg)       No flowsheet data found.    ASSESSMENT AND PLAN:  1.  Peripheral arterial disease with previous critical limb ischemia status post successful complex endovascular intervention of the occluded left popliteal artery and TP trunk with placement of 2 stents.  Reports worsening bilateral foot pain and some increased discoloration.  I am going to repeat his ABI lower extremity duplex.  I suspect some of his symptoms are due to chronic venous insufficiency.  2. Chronic atrial fibrillation: His rate is controlled and he is on long-term anticoagulation with Xarelto.  There was consideration for stopping Xarelto given recurrent falls.   However, the patient has been study the last month and is getting more support from family members.  I think it is reasonable to continue anticoagulation for now given that his stroke risk is very high without anticoagulation.  3.  Essential hypertension: Blood pressure is controlled.  4. Peripheral neuropathy: Gabapentin was discontinued due to concern that it might be affecting his balance.   Disposition:   FU with me in 6 months  Signed,  Kathlyn Sacramento, MD  06/27/2020 10:40 AM    [place]

## 2020-06-30 DIAGNOSIS — M25522 Pain in left elbow: Secondary | ICD-10-CM | POA: Diagnosis not present

## 2020-07-07 ENCOUNTER — Other Ambulatory Visit: Payer: Self-pay | Admitting: Family Medicine

## 2020-07-07 DIAGNOSIS — F5104 Psychophysiologic insomnia: Secondary | ICD-10-CM

## 2020-07-07 NOTE — Telephone Encounter (Signed)
Refill request for:  Mirtazpine 7.5 mg tabs  LR 02/29/20, #30, 2 rfs LOV 05/16/20 FOV 3/10//22  Please review and advise.   Thanks. Dm/cma

## 2020-07-10 ENCOUNTER — Encounter: Payer: Self-pay | Admitting: Family Medicine

## 2020-07-12 ENCOUNTER — Telehealth: Payer: Self-pay | Admitting: Family Medicine

## 2020-07-12 NOTE — Telephone Encounter (Signed)
Attempted to schedule AWV. Unable to LVM.  Will try at later time.  

## 2020-07-13 ENCOUNTER — Ambulatory Visit (HOSPITAL_COMMUNITY)
Admission: RE | Admit: 2020-07-13 | Discharge: 2020-07-13 | Disposition: A | Discharge status: Home / Self Care | Payer: Medicare HMO | Source: Ambulatory Visit | Attending: Cardiovascular Disease | Admitting: Cardiovascular Disease

## 2020-07-13 ENCOUNTER — Other Ambulatory Visit: Payer: Self-pay

## 2020-07-13 ENCOUNTER — Other Ambulatory Visit (HOSPITAL_COMMUNITY): Payer: Self-pay | Admitting: Cardiovascular Disease

## 2020-07-13 DIAGNOSIS — M79672 Pain in left foot: Secondary | ICD-10-CM | POA: Insufficient documentation

## 2020-07-13 DIAGNOSIS — M79671 Pain in right foot: Secondary | ICD-10-CM | POA: Insufficient documentation

## 2020-07-13 DIAGNOSIS — I739 Peripheral vascular disease, unspecified: Secondary | ICD-10-CM | POA: Insufficient documentation

## 2020-07-13 DIAGNOSIS — Z95820 Peripheral vascular angioplasty status with implants and grafts: Secondary | ICD-10-CM

## 2020-07-18 ENCOUNTER — Telehealth: Payer: Self-pay

## 2020-07-18 NOTE — Telephone Encounter (Signed)
Hiram Comber, home health nurse, just completed an in home visit with pt. Pt reported to her that his stool had been black. Pt is on iron pills.   Nurse stated his Heart Rate was 108.  Please advise. Thank you

## 2020-07-19 NOTE — Telephone Encounter (Signed)
Returned Anissa's call form home health no answer LMTCB. Tried calling patient to check on him no answer LMTCB

## 2020-07-20 ENCOUNTER — Other Ambulatory Visit: Payer: Self-pay

## 2020-07-20 DIAGNOSIS — I739 Peripheral vascular disease, unspecified: Secondary | ICD-10-CM

## 2020-07-20 NOTE — Progress Notes (Signed)
Opened in error

## 2020-07-24 NOTE — Telephone Encounter (Signed)
Unable to reach [name] patient has an upcoming visit to come in for OV. Tried calling patient to check on symptoms. No answer LMTCB

## 2020-07-25 ENCOUNTER — Encounter: Payer: Self-pay | Admitting: Family Medicine

## 2020-07-27 ENCOUNTER — Ambulatory Visit (INDEPENDENT_AMBULATORY_CARE_PROVIDER_SITE_OTHER): Payer: Medicare HMO | Admitting: Family Medicine

## 2020-07-27 ENCOUNTER — Other Ambulatory Visit: Payer: Self-pay

## 2020-07-27 ENCOUNTER — Ambulatory Visit: Payer: Medicare HMO | Admitting: Family Medicine

## 2020-07-27 ENCOUNTER — Encounter: Payer: Self-pay | Admitting: Family Medicine

## 2020-07-27 VITALS — BP 126/78 | HR 90 | Temp 97.6°F | Ht 71.0 in | Wt 157.0 lb

## 2020-07-27 DIAGNOSIS — D509 Iron deficiency anemia, unspecified: Secondary | ICD-10-CM | POA: Diagnosis not present

## 2020-07-27 DIAGNOSIS — N189 Chronic kidney disease, unspecified: Secondary | ICD-10-CM

## 2020-07-27 DIAGNOSIS — E119 Type 2 diabetes mellitus without complications: Secondary | ICD-10-CM | POA: Diagnosis not present

## 2020-07-27 DIAGNOSIS — E538 Deficiency of other specified B group vitamins: Secondary | ICD-10-CM

## 2020-07-27 DIAGNOSIS — I480 Paroxysmal atrial fibrillation: Secondary | ICD-10-CM | POA: Diagnosis not present

## 2020-07-27 DIAGNOSIS — E559 Vitamin D deficiency, unspecified: Secondary | ICD-10-CM | POA: Diagnosis not present

## 2020-07-27 DIAGNOSIS — E1122 Type 2 diabetes mellitus with diabetic chronic kidney disease: Secondary | ICD-10-CM | POA: Diagnosis not present

## 2020-07-27 LAB — BASIC METABOLIC PANEL
BUN: 16 mg/dL (ref 6–23)
CO2: 28 mEq/L (ref 19–32)
Calcium: 8 mg/dL — ABNORMAL LOW (ref 8.4–10.5)
Chloride: 107 mEq/L (ref 96–112)
Creatinine, Ser: 1.36 mg/dL (ref 0.40–1.50)
GFR: 44.58 mL/min — ABNORMAL LOW (ref >60.00)
Glucose, Bld: 114 mg/dL — ABNORMAL HIGH (ref 70–99)
Potassium: 4.2 mEq/L (ref 3.5–5.1)
Sodium: 141 mEq/L (ref 135–145)

## 2020-07-27 LAB — VITAMIN B12: Vitamin B-12: 1454 pg/mL — ABNORMAL HIGH (ref 211–911)

## 2020-07-27 LAB — CBC
HCT: 31.4 % — ABNORMAL LOW (ref 39.0–52.0)
Hemoglobin: 10.3 g/dL — ABNORMAL LOW (ref 13.0–17.0)
MCHC: 32.9 g/dL (ref 30.0–36.0)
MCV: 77.9 fl — ABNORMAL LOW (ref 78.0–100.0)
Platelets: 136 10*3/uL — ABNORMAL LOW (ref 150.0–400.0)
RBC: 4.03 Mil/uL — ABNORMAL LOW (ref 4.22–5.81)
RDW: 18.6 % — ABNORMAL HIGH (ref 11.5–15.5)
WBC: 5.4 10*3/uL (ref 4.0–10.5)

## 2020-07-27 LAB — VITAMIN D 25 HYDROXY (VIT D DEFICIENCY, FRACTURES): VITD: 16.61 ng/mL — ABNORMAL LOW (ref 30.00–100.00)

## 2020-07-27 LAB — HEMOGLOBIN A1C: Hgb A1c MFr Bld: 6.7 % — ABNORMAL HIGH (ref 4.6–6.5)
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
## 2020-07-27 NOTE — Addendum Note (Signed)
Addended by: Lynda Rainwater on: 07/27/2020 08:30 AM   Modules accepted: Orders

## 2020-07-28 ENCOUNTER — Ambulatory Visit (INDEPENDENT_AMBULATORY_CARE_PROVIDER_SITE_OTHER): Payer: Self-pay

## 2020-07-28 DIAGNOSIS — I495 Sick sinus syndrome: Secondary | ICD-10-CM

## 2020-07-28 DIAGNOSIS — M25522 Pain in left elbow: Secondary | ICD-10-CM | POA: Diagnosis not present

## 2020-07-28 LAB — IRON,TIBC AND FERRITIN PANEL
%SAT: 27 % (calc) (ref 20–48)
Ferritin: 122 ng/mL (ref 24–380)
Iron: 59 ug/dL (ref 50–180)
TIBC: 220 mcg/dL (calc) — ABNORMAL LOW (ref 250–425)

## 2020-07-31 ENCOUNTER — Encounter: Payer: Self-pay | Admitting: *Deleted

## 2020-07-31 ENCOUNTER — Telehealth: Payer: Self-pay | Admitting: Family Medicine

## 2020-07-31 LAB — CUP PACEART REMOTE DEVICE CHECK
Battery Impedance: 4359 Ohm
Battery Remaining Longevity: 13 mo
Battery Voltage: 2.67 V
Brady Statistic RV Percent Paced: 13 %
Date Time Interrogation Session: 20220311175519
Implantable Lead Implant Date: 20060510
Implantable Lead Location: 753860
Implantable Lead Model: 5076
Implantable Pulse Generator Implant Date: 20131118
Lead Channel Impedance Value: 0 Ohm
Lead Channel Impedance Value: 422 Ohm
Lead Channel Pacing Threshold Amplitude: 0.875 V
Lead Channel Pacing Threshold Pulse Width: 0.4 ms
Lead Channel Setting Pacing Amplitude: 2.5 V
Lead Channel Setting Pacing Pulse Width: 0.4 ms
Lead Channel Setting Sensing Sensitivity: 5.6 mV

## 2020-07-31 NOTE — Telephone Encounter (Signed)
[name] [facility] [phone]) is calling to get verbal orders (unsure of what is needed) She said his diagnosis was diabetes and history of CVA. Please call back.

## 2020-07-31 NOTE — Telephone Encounter (Signed)
Spoke with [name] who's calling from [facility] home health calling for clarification of orders for patient.

## 2020-08-02 DIAGNOSIS — E1122 Type 2 diabetes mellitus with diabetic chronic kidney disease: Secondary | ICD-10-CM | POA: Diagnosis not present

## 2020-08-02 DIAGNOSIS — I69392 Facial weakness following cerebral infarction: Secondary | ICD-10-CM | POA: Diagnosis not present

## 2020-08-02 DIAGNOSIS — I480 Paroxysmal atrial fibrillation: Secondary | ICD-10-CM | POA: Diagnosis not present

## 2020-08-02 DIAGNOSIS — E559 Vitamin D deficiency, unspecified: Secondary | ICD-10-CM | POA: Diagnosis not present

## 2020-08-02 DIAGNOSIS — D509 Iron deficiency anemia, unspecified: Secondary | ICD-10-CM | POA: Diagnosis not present

## 2020-08-02 DIAGNOSIS — E538 Deficiency of other specified B group vitamins: Secondary | ICD-10-CM | POA: Diagnosis not present

## 2020-08-02 DIAGNOSIS — N189 Chronic kidney disease, unspecified: Secondary | ICD-10-CM | POA: Diagnosis not present

## 2020-08-02 DIAGNOSIS — I129 Hypertensive chronic kidney disease with stage 1 through stage 4 chronic kidney disease, or unspecified chronic kidney disease: Secondary | ICD-10-CM | POA: Diagnosis not present

## 2020-08-02 DIAGNOSIS — Z8546 Personal history of malignant neoplasm of prostate: Secondary | ICD-10-CM | POA: Diagnosis not present

## 2020-08-04 ENCOUNTER — Encounter: Payer: Self-pay | Admitting: Family Medicine

## 2020-08-04 DIAGNOSIS — B351 Tinea unguium: Secondary | ICD-10-CM | POA: Diagnosis not present

## 2020-08-04 DIAGNOSIS — L603 Nail dystrophy: Secondary | ICD-10-CM | POA: Diagnosis not present

## 2020-08-04 DIAGNOSIS — I70213 Atherosclerosis of native arteries of extremities with intermittent claudication, bilateral legs: Secondary | ICD-10-CM | POA: Diagnosis not present

## 2020-08-04 DIAGNOSIS — L84 Corns and callosities: Secondary | ICD-10-CM | POA: Diagnosis not present

## 2020-08-04 DIAGNOSIS — I70223 Atherosclerosis of native arteries of extremities with rest pain, bilateral legs: Secondary | ICD-10-CM | POA: Diagnosis not present

## 2020-08-04 NOTE — Progress Notes (Signed)
Remote pacemaker transmission.   

## 2020-08-11 ENCOUNTER — Telehealth: Payer: Self-pay | Admitting: Family Medicine

## 2020-08-11 NOTE — Telephone Encounter (Signed)
Radovan from [facility] is calling to let Dr. Ethelene Hal know, he was not able to give pt OT this week. He plans to see pt next week for OT.

## 2020-08-17 ENCOUNTER — Ambulatory Visit: Payer: Medicare HMO | Admitting: Family Medicine

## 2020-08-17 ENCOUNTER — Telehealth: Payer: Self-pay | Admitting: Family Medicine

## 2020-08-17 DIAGNOSIS — E559 Vitamin D deficiency, unspecified: Secondary | ICD-10-CM | POA: Diagnosis not present

## 2020-08-17 DIAGNOSIS — Z8546 Personal history of malignant neoplasm of prostate: Secondary | ICD-10-CM | POA: Diagnosis not present

## 2020-08-17 DIAGNOSIS — D509 Iron deficiency anemia, unspecified: Secondary | ICD-10-CM | POA: Diagnosis not present

## 2020-08-17 DIAGNOSIS — E1122 Type 2 diabetes mellitus with diabetic chronic kidney disease: Secondary | ICD-10-CM | POA: Diagnosis not present

## 2020-08-17 DIAGNOSIS — I69392 Facial weakness following cerebral infarction: Secondary | ICD-10-CM | POA: Diagnosis not present

## 2020-08-17 DIAGNOSIS — I480 Paroxysmal atrial fibrillation: Secondary | ICD-10-CM | POA: Diagnosis not present

## 2020-08-17 DIAGNOSIS — I129 Hypertensive chronic kidney disease with stage 1 through stage 4 chronic kidney disease, or unspecified chronic kidney disease: Secondary | ICD-10-CM | POA: Diagnosis not present

## 2020-08-17 DIAGNOSIS — E538 Deficiency of other specified B group vitamins: Secondary | ICD-10-CM | POA: Diagnosis not present

## 2020-08-17 DIAGNOSIS — N189 Chronic kidney disease, unspecified: Secondary | ICD-10-CM | POA: Diagnosis not present
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
## 2020-08-18 NOTE — Telephone Encounter (Signed)
Please advise 

## 2020-08-20 NOTE — Telephone Encounter (Signed)
Go ahead and give verbal order for OT.

## 2020-08-21 NOTE — Telephone Encounter (Signed)
Left a detailed message on voicemail, informing Radovan, [place] ok for pt to have OT, Per Dr. Ethelene Hal.

## 2020-08-23 ENCOUNTER — Ambulatory Visit: Payer: Medicare HMO | Admitting: Family Medicine

## 2020-08-23 DIAGNOSIS — Z0289 Encounter for other administrative examinations: Secondary | ICD-10-CM

## 2020-09-05 ENCOUNTER — Other Ambulatory Visit: Payer: Self-pay | Admitting: Family Medicine

## 2020-09-05 DIAGNOSIS — I480 Paroxysmal atrial fibrillation: Secondary | ICD-10-CM

## 2020-09-12 ENCOUNTER — Telehealth: Payer: Self-pay | Admitting: Family Medicine

## 2020-09-12 NOTE — Telephone Encounter (Signed)
Please advise message below, okay for verbal orders? Also asking if referral could be placed to neurologist for memory loss patient becoming worse and he is still also driving.

## 2020-09-12 NOTE — Telephone Encounter (Signed)
Okay 

## 2020-09-12 NOTE — Telephone Encounter (Signed)
Radovan from [facility] is wanting verbal orders for OT-1 once a week for 2 weeks beginning on this coming Monday on 09/18/20. Please advise Radovan at [phone] and a message can be left on his vm.  Elias Else is also wondering if pt is going to get a referral for his memory issues.

## 2020-09-14 ENCOUNTER — Other Ambulatory Visit: Payer: Self-pay

## 2020-09-14 DIAGNOSIS — R413 Other amnesia: Secondary | ICD-10-CM

## 2020-09-14 NOTE — Telephone Encounter (Signed)
Done

## 2020-09-21 ENCOUNTER — Other Ambulatory Visit: Payer: Self-pay

## 2020-09-21 ENCOUNTER — Ambulatory Visit: Payer: Medicare (Managed Care) | Admitting: Family Medicine

## 2020-09-21 ENCOUNTER — Encounter: Payer: Self-pay | Admitting: Family Medicine

## 2020-09-21 VITALS — BP 118/66 | HR 100 | Temp 98.1°F | Ht 71.0 in | Wt 149.6 lb

## 2020-09-21 DIAGNOSIS — M6284 Sarcopenia: Secondary | ICD-10-CM | POA: Diagnosis not present

## 2020-09-21 DIAGNOSIS — Z9189 Other specified personal risk factors, not elsewhere classified: Secondary | ICD-10-CM | POA: Diagnosis not present

## 2020-09-21 DIAGNOSIS — R2681 Unsteadiness on feet: Secondary | ICD-10-CM | POA: Diagnosis not present

## 2020-09-21 DIAGNOSIS — G629 Polyneuropathy, unspecified: Secondary | ICD-10-CM | POA: Diagnosis not present

## 2020-09-21 MED ORDER — GABAPENTIN 100 MG PO CAPS: 100.0000 mg | ORAL_CAPSULE | Freq: Every day | ORAL | 2 refills | Status: DC

## 2020-09-21 NOTE — Progress Notes (Signed)
Established Patient Office Visit  Subjective:  Patient ID: [name], male    DOB: Dec 23, 1923  Age: 87 y.o. MRN: [id number]  CC:  Chief Complaint  Patient presents with  . Follow-up    Follow up on restricting patients driving privileges. Patient states that his feet become very cold at night and his legs during the day are weak giving out on him. No appetite losing weight.     HPI [name] presents for a discussion and evaluation of driving safety, nocturnal cold sensation in his feet unsteady gait.  Continues to have some appetite issues.  Has lost muscle mass in his thighs.  Pushes off to stand up.  Reluctant to use his walker.  He is commonly awoken at night feeling as though both of his feet are on blocks advised.  He has to get up and walk around to work it out.  He is accompanied by a family friend today.  Past Medical History:  Diagnosis Date  . Arthritis   . Atrial fibrillation ([place])   . CKD (chronic kidney disease)   . History of hiatal hernia   . Hx SBO    Last 2013 all treated conservatively  . Hypertension   . Presence of permanent cardiac pacemaker   . Prostate cancer ([place])   . Stroke [facility]) 2017   left facial drooping noted 08/14/2016  . Tachycardia-bradycardia syndrome ([place])   . Type II diabetes mellitus ([place])   . Ventral hernia    x3    Past Surgical History:  Procedure Laterality Date  . ABDOMINAL AORTOGRAM W/LOWER EXTREMITY N/A 08/07/2016   Procedure: Abdominal Aortogram w/Lower Extremity;  Surgeon: Wellington Hampshire, MD;  Location: [place] [facility];  Service: Cardiovascular;  Laterality: N/A;  . CHOLECYSTECTOMY OPEN  03/31/2001   Dr Lindon Romp  . COLON SURGERY    . EXPLORATORY LAPAROTOMY  08/2004   Archie Endo 10/01/2010  . HEMORRHOID SURGERY    . HERNIA REPAIR    . HIATAL HERNIA REPAIR  2002  . INCISIONAL HERNIA REPAIR  08/2004   Archie Endo 10/01/2010  . INSERT / REPLACE / REMOVE PACEMAKER    . PERIPHERAL VASCULAR BALLOON ANGIOPLASTY  08/07/2016    Procedure: Peripheral Vascular Balloon Angioplasty;  Surgeon: Wellington Hampshire, MD;  Location: [place] [facility];  Service: Cardiovascular;;  left popliteal artery  . PERIPHERAL VASCULAR INTERVENTION  08/14/2016   popliteal artery/notes 08/14/2016  . PERIPHERAL VASCULAR INTERVENTION Left 08/14/2016   Procedure: Peripheral Vascular Intervention;  Surgeon: Wellington Hampshire, MD;  Location: [place] [facility];  Service: Cardiovascular;  Laterality: Left;  popliteal artery  . PERMANENT PACEMAKER GENERATOR CHANGE N/A 04/06/2012   Procedure: PERMANENT PACEMAKER GENERATOR CHANGE;  Surgeon: Evans Lance, MD;  Location: [facility] CATH LAB;  Service: Cardiovascular;  Laterality: N/A;  . SMALL INTESTINE SURGERY  09/16/2004   SBO resection, VH repair    Family History  Problem Relation Age of Onset  . Pneumonia Father   . Stroke Brother   . Stroke Sister   . Cancer Brother        type unknown    Social History   Socioeconomic History  . Marital status: Widowed    Spouse name: Not on file  . Number of children: Not on file  . Years of education: Not on file  . Highest education level: Not on file  Occupational History  . Not on file  Tobacco Use  . Smoking status: Former Smoker  Packs/day: 1.50    Years: 15.00    Pack years: 22.50    Quit date: 04/16/1955    Years since quitting: 65.4  . Smokeless tobacco: Former Systems developer    Types: [place] date: 08/17/1952  Substance and Sexual Activity  . Alcohol use: No    Comment: 08/14/2016 "drank beer when I was a teenager"  . Drug use: No  . Sexual activity: Not on file  Other Topics Concern  . Not on file  Social History Narrative   Lives alone   Social Determinants of Health   Financial Resource Strain: Not on file  Food Insecurity: Not on file  Transportation Needs: Not on file  Physical Activity: Not on file  Stress: Not on file  Social Connections: Not on file  Intimate Partner Violence: Not on file    Outpatient Medications Prior  to Visit  Medication Sig Dispense Refill  . atorvastatin (LIPITOR) 10 MG tablet Take 10 mg by mouth daily.    . Cholecalciferol (VITAMIN D-3) 125 MCG (5000 UT) TABS Take by mouth.    . ferrous sulfate (FEROSUL) 325 (65 FE) MG tablet Take one by mouth every other day. 90 tablet 2  . latanoprost (XALATAN) 0.005 % ophthalmic solution Place 1 drop into both eyes nightly.    Marland Kitchen losartan (COZAAR) 50 MG tablet Take 1 tablet (50 mg total) by mouth daily. 90 tablet 0  . metoprolol succinate (TOPROL-XL) 25 MG 24 hr tablet TAKE 1 TABLET(25 MG) BY MOUTH EVERY EVENING 90 tablet 1  . niacin 500 MG tablet Take 500 mg by mouth at bedtime.    . pantoprazole (PROTONIX) 40 MG tablet TAKE 1 TABLET(40 MG) BY MOUTH DAILY 90 tablet 1  . vitamin B-12 (CYANOCOBALAMIN) 1000 MCG tablet Take 1 tablet (1,000 mcg total) by mouth daily. 90 tablet 3  . XARELTO 15 MG TABS tablet TAKE 1 TABLET(15 MG) BY MOUTH DAILY WITH SUPPER 30 tablet 3  . zinc gluconate 50 MG tablet Take 50 mg by mouth daily.     No facility-administered medications prior to visit.    No Known Allergies  ROS Review of Systems  Constitutional: Negative.   HENT: Positive for hearing loss.   Eyes: Negative for photophobia and visual disturbance.  Respiratory: Negative.   Cardiovascular: Negative.   Gastrointestinal: Negative.   Musculoskeletal: Positive for gait problem.  Neurological: Positive for weakness. Negative for numbness.  Psychiatric/Behavioral: Negative.       Objective:    Physical Exam Constitutional:      General: He is not in acute distress.    Appearance: Normal appearance. He is not ill-appearing, toxic-appearing or diaphoretic.  HENT:     Head: Normocephalic and atraumatic.  Eyes:     General: No scleral icterus.       Right eye: No discharge.        Left eye: No discharge.     Conjunctiva/sclera: Conjunctivae normal.  Cardiovascular:     Rate and Rhythm: Normal rate and regular rhythm.  Pulmonary:     Effort:  Pulmonary effort is normal.     Breath sounds: Normal breath sounds.  Neurological:     Mental Status: He is alert and oriented to person, place, and time.    Diabetic Foot Exam - Simple   Simple Foot Form Diabetic Foot exam was performed with the following findings: Yes 09/21/2020  3:24 PM  Visual Inspection See comments: Yes Sensation Testing Intact to touch and monofilament testing bilaterally: Yes  Pulse Check See comments: Yes Comments Feet are pes planus bilaterally.  There are no lesions or rashes.  Could not find pulses but capillary refill was brisk at the toes.     BP 118/66   Pulse 100   Temp 98.1 F (36.7 C) (Temporal)   Ht 5\' 11"  (1.803 m)   Wt 149 lb 9.6 oz (67.9 kg)   SpO2 98%   BMI 20.86 kg/m  Wt Readings from Last 3 Encounters:  09/21/20 149 lb 9.6 oz (67.9 kg)  07/27/20 157 lb (71.2 kg)  06/27/20 152 lb 12.8 oz (69.3 kg)     Health Maintenance Due  Topic Date Due  . FOOT EXAM  Never done  . OPHTHALMOLOGY EXAM  Never done  . URINE MICROALBUMIN  Never done  . PNA vac Low Risk Adult (1 of 2 - PCV13) Never done  . COVID-19 Vaccine (3 - Booster for Moderna series) 03/02/2020    There are no preventive care reminders to display for this patient.  Lab Results  Component Value Date   TSH 2.80 03/21/2020   Lab Results  Component Value Date   WBC 5.4 07/27/2020   HGB 10.3 (L) 07/27/2020   HCT 31.4 (L) 07/27/2020   MCV 77.9 (L) 07/27/2020   PLT 136.0 (L) 07/27/2020   Lab Results  Component Value Date   NA 141 07/27/2020   K 4.2 07/27/2020   CO2 28 07/27/2020   GLUCOSE 114 (H) 07/27/2020   BUN 16 07/27/2020   CREATININE 1.36 07/27/2020   BILITOT 0.6 12/05/2017   ALKPHOS 57 12/05/2017   AST 20 12/05/2017   ALT 13 12/05/2017   PROT 6.3 (L) 12/05/2017   ALBUMIN 3.3 (L) 12/05/2017   CALCIUM 8.0 (L) 07/27/2020   ANIONGAP 8 05/24/2020   GFR 44.58 (L) 07/27/2020   Lab Results  Component Value Date   CHOL 122 07/23/2015   Lab Results   Component Value Date   HDL 43 07/23/2015   Lab Results  Component Value Date   LDLCALC 68 07/23/2015   Lab Results  Component Value Date   TRIG 55 07/23/2015   Lab Results  Component Value Date   CHOLHDL 2.8 07/23/2015   Lab Results  Component Value Date   HGBA1C 6.7 (H) 07/27/2020      Assessment & Plan:   Problem List Items Addressed This Visit      Nervous and Auditory   Neuropathy   Relevant Medications   gabapentin (NEURONTIN) 100 MG capsule     Musculoskeletal and Integument   Sarcopenia - Primary     Other   Unsteady gait when walking   Driving safety issue      Meds ordered this encounter  Medications  . gabapentin (NEURONTIN) 100 MG capsule    Sig: Take 1 capsule (100 mg total) by mouth at bedtime.    Dispense:  30 capsule    Refill:  2    Follow-up: Return in about 3 months (around 12/22/2020), or Drink one glucerna 3 times daily. Use walker please.Edmonia Lynch patient is vision today it was actually 2030 with both eyes.  20/30 in OS and 2040 in OD.  I went ahead and advised patient not to drive anymore and he said okay.   [name], MD

## 2020-09-22 ENCOUNTER — Encounter: Payer: Self-pay | Admitting: Family Medicine

## 2020-09-24 ENCOUNTER — Encounter: Payer: Self-pay | Admitting: Family Medicine

## 2020-09-25 NOTE — Addendum Note (Signed)
Addended by: Abelino Derrick A on: 09/25/2020 01:10 PM   Modules accepted: Orders

## 2020-10-30 ENCOUNTER — Other Ambulatory Visit: Payer: Self-pay

## 2020-10-30 ENCOUNTER — Encounter: Payer: Self-pay | Admitting: Family Medicine

## 2020-10-30 ENCOUNTER — Ambulatory Visit (INDEPENDENT_AMBULATORY_CARE_PROVIDER_SITE_OTHER): Payer: Medicare (Managed Care) | Admitting: Family Medicine

## 2020-10-30 VITALS — BP 124/70 | HR 102 | Temp 98.3°F | Ht 71.0 in | Wt 144.2 lb

## 2020-10-30 DIAGNOSIS — D509 Iron deficiency anemia, unspecified: Secondary | ICD-10-CM | POA: Diagnosis not present

## 2020-10-30 DIAGNOSIS — E440 Moderate protein-calorie malnutrition: Secondary | ICD-10-CM

## 2020-10-30 DIAGNOSIS — K5901 Slow transit constipation: Secondary | ICD-10-CM

## 2020-10-30 DIAGNOSIS — E43 Unspecified severe protein-calorie malnutrition: Secondary | ICD-10-CM | POA: Diagnosis not present

## 2020-10-30 DIAGNOSIS — E119 Type 2 diabetes mellitus without complications: Secondary | ICD-10-CM

## 2020-10-30 DIAGNOSIS — E875 Hyperkalemia: Secondary | ICD-10-CM

## 2020-10-30 DIAGNOSIS — G629 Polyneuropathy, unspecified: Secondary | ICD-10-CM

## 2020-10-30 DIAGNOSIS — E559 Vitamin D deficiency, unspecified: Secondary | ICD-10-CM

## 2020-10-30 LAB — BASIC METABOLIC PANEL
BUN: 21 mg/dL (ref 6–23)
CO2: 30 mEq/L (ref 19–32)
Calcium: 8.8 mg/dL (ref 8.4–10.5)
Chloride: 107 mEq/L (ref 96–112)
Creatinine, Ser: 1.72 mg/dL — ABNORMAL HIGH (ref 0.40–1.50)
GFR: 33.57 mL/min — ABNORMAL LOW (ref >60.00)
Glucose, Bld: 134 mg/dL — ABNORMAL HIGH (ref 70–99)
Potassium: 5.8 mEq/L — ABNORMAL HIGH (ref 3.5–5.1)
Sodium: 142 mEq/L (ref 135–145)

## 2020-10-30 LAB — URINALYSIS, ROUTINE W REFLEX MICROSCOPIC
Bilirubin Urine: NEGATIVE
Hgb urine dipstick: NEGATIVE
Ketones, ur: NEGATIVE
Leukocytes,Ua: NEGATIVE
Nitrite: NEGATIVE
RBC / HPF: NONE SEEN (ref 0–?)
Specific Gravity, Urine: 1.015 (ref 1.000–1.030)
Total Protein, Urine: NEGATIVE
Urine Glucose: NEGATIVE
Urobilinogen, UA: 0.2 (ref 0.0–1.0)
pH: 8.5 — AB (ref 5.0–8.0)

## 2020-10-30 LAB — CBC
HCT: 33.3 % — ABNORMAL LOW (ref 39.0–52.0)
Hemoglobin: 10.7 g/dL — ABNORMAL LOW (ref 13.0–17.0)
MCHC: 32.2 g/dL (ref 30.0–36.0)
MCV: 77.8 fl — ABNORMAL LOW (ref 78.0–100.0)
Platelets: 135 10*3/uL — ABNORMAL LOW (ref 150.0–400.0)
RBC: 4.28 Mil/uL (ref 4.22–5.81)
RDW: 16.5 % — ABNORMAL HIGH (ref 11.5–15.5)
WBC: 6.5 10*3/uL (ref 4.0–10.5)

## 2020-10-30 LAB — HEMOGLOBIN A1C: Hgb A1c MFr Bld: 7.4 % — ABNORMAL HIGH (ref 4.6–6.5)

## 2020-10-30 MED ORDER — GABAPENTIN 300 MG PO CAPS
300.0000 mg | ORAL_CAPSULE | Freq: Three times a day (TID) | ORAL | 3 refills | Status: DC
Start: 2020-10-30 — End: 2021-02-20

## 2020-10-30 MED ORDER — POLYETHYLENE GLYCOL 3350 17 GM/SCOOP PO POWD: 17.0000 g | Freq: Two times a day (BID) | ORAL | 1 refills | Status: DC | PRN

## 2020-10-30 MED ORDER — VITAMIN D (ERGOCALCIFEROL) 1.25 MG (50000 UNIT) PO CAPS: 50000.0000 [IU] | ORAL_CAPSULE | ORAL | 1 refills | Status: DC

## 2020-10-30 MED ORDER — DOCUSATE SODIUM 100 MG PO CAPS: 100.0000 mg | ORAL_CAPSULE | Freq: Two times a day (BID) | ORAL | 0 refills | Status: DC

## 2020-10-30 NOTE — Progress Notes (Addendum)
Established Patient Office Visit  Subjective:  Patient ID: [name], male    DOB: 1923-10-01  Age: 87 y.o. MRN: [id number]  CC:  Chief Complaint  Patient presents with   Follow-up    3 month follow up, states that his feet are still very cold at night little aches toe on left foot little sore.     HPI [name] presents for follow-up of weight loss due to malnutrition, vitamin D deficiency with hypocalcemia, constipation microcytic anemia neuropathy of lower extremities and diabetes.  Patient is accompanied by his caregiver.  Diabetes pends diet controlled mostly due to to weight loss.  According to his caregiver he is still consuming high carbohydrate diet.  He drinks orange juice and eats cake.  He is not taking the Glucerna and has been recommended over the last few visits.  It was not clear to me about whether or not he is taking his iron pills.  Taking [place] citrate for constipation.  Admits that it is difficult for him to remember to drink enough water.  Current dose of Neurontin is not helping the cold feeling in his feet that wake him up at night.  Vitamin D and calcium have been low.   Past Medical History:  Diagnosis Date   Arthritis    Atrial fibrillation ([place])    CKD (chronic kidney disease)    History of hiatal hernia    Hx SBO    Last 2013 all treated conservatively   Hypertension    Presence of permanent cardiac pacemaker    Prostate cancer [facility])    Stroke ([place]) 2017   left facial drooping noted 08/14/2016   Tachycardia-bradycardia syndrome ([place])    Type II diabetes mellitus ([place])    Ventral hernia    x3    Past Surgical History:  Procedure Laterality Date   ABDOMINAL AORTOGRAM W/LOWER EXTREMITY N/A 08/07/2016   Procedure: Abdominal Aortogram w/Lower Extremity;  Surgeon: Wellington Hampshire, MD;  Location: [place] [facility];  Service: Cardiovascular;  Laterality: N/A;   CHOLECYSTECTOMY OPEN  03/31/2001   Dr Lindon Romp   COLON SURGERY     EXPLORATORY  LAPAROTOMY  08/2004   Archie Endo 10/01/2010   HEMORRHOID SURGERY     HERNIA REPAIR     HIATAL HERNIA REPAIR  2002   INCISIONAL HERNIA REPAIR  08/2004   Archie Endo 10/01/2010   INSERT / REPLACE / REMOVE PACEMAKER     PERIPHERAL VASCULAR BALLOON ANGIOPLASTY  08/07/2016   Procedure: Peripheral Vascular Balloon Angioplasty;  Surgeon: Wellington Hampshire, MD;  Location: [place] [facility];  Service: Cardiovascular;;  left popliteal artery   PERIPHERAL VASCULAR INTERVENTION  08/14/2016   popliteal artery/notes 08/14/2016   PERIPHERAL VASCULAR INTERVENTION Left 08/14/2016   Procedure: Peripheral Vascular Intervention;  Surgeon: Wellington Hampshire, MD;  Location: [place] [facility];  Service: Cardiovascular;  Laterality: Left;  popliteal artery   PERMANENT PACEMAKER GENERATOR CHANGE N/A 04/06/2012   Procedure: PERMANENT PACEMAKER GENERATOR CHANGE;  Surgeon: Evans Lance, MD;  Location: [facility] CATH LAB;  Service: Cardiovascular;  Laterality: N/A;   SMALL INTESTINE SURGERY  09/16/2004   SBO resection, VH repair    Family History  Problem Relation Age of Onset   Pneumonia Father    Stroke Brother    Stroke Sister    Cancer Brother        type unknown    Social History   Socioeconomic History   Marital status: Widowed    Spouse  name: Not on file   Number of children: Not on file   Years of education: Not on file   Highest education level: Not on file  Occupational History   Not on file  Tobacco Use   Smoking status: Former    Packs/day: 1.50    Years: 15.00    Pack years: 22.50    Types: Cigarettes    Quit date: 04/16/1955    Years since quitting: 65.6   Smokeless tobacco: Former    Types: Chew    Quit date: 08/17/1952  Substance and Sexual Activity   Alcohol use: No    Comment: 08/14/2016 "drank beer when I was a teenager"   Drug use: No   Sexual activity: Not on file  Other Topics Concern   Not on file  Social History Narrative   Lives alone   Social Determinants of Health   Financial  Resource Strain: Not on file  Food Insecurity: Not on file  Transportation Needs: Not on file  Physical Activity: Not on file  Stress: Not on file  Social Connections: Not on file  Intimate Partner Violence: Not on file    Outpatient Medications Prior to Visit  Medication Sig Dispense Refill   atorvastatin (LIPITOR) 10 MG tablet Take 10 mg by mouth daily.     Cholecalciferol (VITAMIN D-3) 125 MCG (5000 UT) TABS Take by mouth.     ferrous sulfate (FEROSUL) 325 (65 FE) MG tablet Take one by mouth every other day. 90 tablet 2   latanoprost (XALATAN) 0.005 % ophthalmic solution Place 1 drop into both eyes nightly.     metoprolol succinate (TOPROL-XL) 25 MG 24 hr tablet TAKE 1 TABLET(25 MG) BY MOUTH EVERY EVENING 90 tablet 1   niacin 500 MG tablet Take 500 mg by mouth at bedtime.     pantoprazole (PROTONIX) 40 MG tablet TAKE 1 TABLET(40 MG) BY MOUTH DAILY 90 tablet 1   XARELTO 15 MG TABS tablet TAKE 1 TABLET(15 MG) BY MOUTH DAILY WITH SUPPER 30 tablet 3   zinc gluconate 50 MG tablet Take 50 mg by mouth daily.     gabapentin (NEURONTIN) 100 MG capsule Take 1 capsule (100 mg total) by mouth at bedtime. 30 capsule 2   vitamin B-12 (CYANOCOBALAMIN) 1000 MCG tablet Take 1 tablet (1,000 mcg total) by mouth daily. 90 tablet 3   losartan (COZAAR) 50 MG tablet Take 1 tablet (50 mg total) by mouth daily. 90 tablet 0   No facility-administered medications prior to visit.    No Known Allergies  ROS Review of Systems  Constitutional: Negative.   HENT: Negative.    Eyes:  Negative for photophobia and visual disturbance.  Respiratory: Negative.    Cardiovascular: Negative.   Gastrointestinal:  Positive for constipation. Negative for anal bleeding and blood in stool.  Endocrine: Negative for polyphagia and polyuria.  Genitourinary:  Positive for frequency.  Musculoskeletal:  Positive for gait problem.  Skin:  Negative for color change and pallor.  Neurological:  Negative for speech difficulty.   Psychiatric/Behavioral: Negative.       Objective:    Physical Exam Vitals and nursing note reviewed.  Constitutional:      General: He is not in acute distress.    Appearance: Normal appearance. He is normal weight. He is not ill-appearing, toxic-appearing or diaphoretic.  HENT:     Head: Normocephalic and atraumatic.     Right Ear: Tympanic membrane, ear canal and external ear normal.     Left Ear:  Tympanic membrane, ear canal and external ear normal.     Mouth/Throat:     Mouth: Mucous membranes are dry.     Pharynx: Oropharynx is clear. No oropharyngeal exudate or posterior oropharyngeal erythema.  Cardiovascular:     Rate and Rhythm: Normal rate and regular rhythm.  Pulmonary:     Effort: Pulmonary effort is normal.     Breath sounds: Normal breath sounds. No rales.  Abdominal:     General: Bowel sounds are normal.     Palpations: Abdomen is soft.     Tenderness: There is no abdominal tenderness. There is no guarding or rebound.     Hernia: A hernia is present. Hernia is present in the ventral area.    Musculoskeletal:     Cervical back: No rigidity or tenderness.     Right lower leg: Edema present.     Left lower leg: Edema present.  Lymphadenopathy:     Cervical: No cervical adenopathy.  Skin:    General: Skin is warm and dry.  Neurological:     Mental Status: He is alert and oriented to person, place, and time.  Psychiatric:        Mood and Affect: Mood normal.        Behavior: Behavior normal.   Diabetic Foot Exam - Simple   Simple Foot Form Diabetic Foot exam was performed with the following findings: Yes 10/30/2020 10:57 AM  Visual Inspection See comments: Yes Sensation Testing See comments: Yes Pulse Check See comments: Yes Comments Feet are standard bilaterally.  Mild decrease in sensation to light touch.  Pulses are diminished.  There is some maceration between the left fifth and fourth toes.      BP 124/70   Pulse (!) 102   Temp 98.3 F (36.8  C) (Temporal)   Ht 5\' 11"  (1.803 m)   Wt 144 lb 3.2 oz (65.4 kg)   SpO2 98%   BMI 20.11 kg/m  Wt Readings from Last 3 Encounters:  10/30/20 144 lb 3.2 oz (65.4 kg)  09/21/20 149 lb 9.6 oz (67.9 kg)  07/27/20 157 lb (71.2 kg)     Health Maintenance Due  Topic Date Due   OPHTHALMOLOGY EXAM  Never done   URINE MICROALBUMIN  Never done   Zoster Vaccines- Shingrix (1 of 2) Never done   PNA vac Low Risk Adult (1 of 2 - PCV13) Never done    There are no preventive care reminders to display for this patient.  Lab Results  Component Value Date   TSH 2.80 03/21/2020   Lab Results  Component Value Date   WBC 6.5 10/30/2020   HGB 10.7 (L) 10/30/2020   HCT 33.3 (L) 10/30/2020   MCV 77.8 (L) 10/30/2020   PLT 135.0 (L) 10/30/2020   Lab Results  Component Value Date   NA 142 10/30/2020   K 5.8 (H) 10/30/2020   CO2 30 10/30/2020   GLUCOSE 134 (H) 10/30/2020   BUN 21 10/30/2020   CREATININE 1.72 (H) 10/30/2020   BILITOT 0.6 12/05/2017   ALKPHOS 57 12/05/2017   AST 20 12/05/2017   ALT 13 12/05/2017   PROT 6.3 (L) 12/05/2017   ALBUMIN 3.3 (L) 12/05/2017   CALCIUM 8.8 10/30/2020   ANIONGAP 8 05/24/2020   GFR 33.57 (L) 10/30/2020   Lab Results  Component Value Date   CHOL 122 07/23/2015   Lab Results  Component Value Date   HDL 43 07/23/2015   Lab Results  Component Value Date  [place] 68 07/23/2015   Lab Results  Component Value Date   TRIG 55 07/23/2015   Lab Results  Component Value Date   CHOLHDL 2.8 07/23/2015   Lab Results  Component Value Date   HGBA1C 7.4 (H) 10/30/2020      Assessment & Plan:   Problem List Items Addressed This Visit       Digestive   Slow transit constipation   Relevant Medications   polyethylene glycol powder (GLYCOLAX/MIRALAX) 17 GM/SCOOP powder   docusate sodium (COLACE) 100 MG capsule     Endocrine   Diabetes ([name])   Relevant Orders   Basic metabolic panel (Completed)   Urinalysis, Routine w reflex microscopic  (Completed)   Hemoglobin A1c (Completed)   VAS [place] ABI WITH/WO TBI     Nervous and Auditory   Neuropathy - Primary   Relevant Medications   gabapentin (NEURONTIN) 300 MG capsule     Other   Microcytic anemia   Relevant Orders   CBC (Completed)   Iron, TIBC and Ferritin Panel (Completed)   Malnutrition of moderate degree   Relevant Orders   Prealbumin (Completed)   Hypocalcemia   Relevant Medications   Vitamin D, Ergocalciferol, (DRISDOL) 1.25 MG (50000 UNIT) CAPS capsule   Vitamin D deficiency   Relevant Medications   Vitamin D, Ergocalciferol, (DRISDOL) 1.25 MG (50000 UNIT) CAPS capsule   Edema due to malnutrition [facility])   Relevant Orders   Compression stockings   Hyperkalemia   Relevant Orders   Potassium    Meds ordered this encounter  Medications   polyethylene glycol powder (GLYCOLAX/MIRALAX) 17 GM/SCOOP powder    Sig: Take 17 g by mouth 2 (two) times daily as needed.    Dispense:  3350 g    Refill:  1   docusate sodium (COLACE) 100 MG capsule    Sig: Take 1 capsule (100 mg total) by mouth 2 (two) times daily.    Dispense:  10 capsule    Refill:  0   gabapentin (NEURONTIN) 300 MG capsule    Sig: Take 1 capsule (300 mg total) by mouth 3 (three) times daily.    Dispense:  90 capsule    Refill:  3   Vitamin D, Ergocalciferol, (DRISDOL) 1.25 MG (50000 UNIT) CAPS capsule    Sig: Take 1 capsule (50,000 Units total) by mouth every 7 (seven) days.    Dispense:  15 capsule    Refill:  1    Follow-up: Return in about 3 months (around 01/30/2021).  There seems to be a disconnect between recommendations made here in the clinic and what happens at home.  I wrote out instructions to send home with patient's caregiver that he is to have Glucerna 3 times daily, use MiraLAX twice daily use Colax twice daily use 10 stockings for edema.  I spent over 40 minutes caring for this patient with consultation and exam.   [name], MD

## 2020-10-31 LAB — IRON,TIBC AND FERRITIN PANEL
%SAT: 24 % (calc) (ref 20–48)
Ferritin: 90 ng/mL (ref 24–380)
Iron: 63 ug/dL (ref 50–180)
TIBC: 264 mcg/dL (calc) (ref 250–425)

## 2020-10-31 LAB — PREALBUMIN: Prealbumin: 21 mg/dL (ref 21–43)

## 2020-11-01 ENCOUNTER — Telehealth: Payer: Self-pay | Admitting: Family Medicine

## 2020-11-01 NOTE — Telephone Encounter (Signed)
Left message for patient to call back and schedule Medicare Annual Wellness Visit (AWV).   Please offer to do virtually or by telephone.   Due for AWVI  Please schedule at anytime with Nurse Health Advisor.   

## 2020-11-03 ENCOUNTER — Telehealth: Payer: Self-pay | Admitting: Family Medicine

## 2020-11-03 NOTE — Telephone Encounter (Signed)
Spoke with patients granddaughter who verbally understood medication that's questioned patient supposed to take for 10 days. She was not sure how many days patient was supposed to take.

## 2020-11-03 NOTE — Telephone Encounter (Signed)
Pt's granddaughter [name]) is concerned about her grandpa([name]'s) prescription docusate sodium (COLACE) 100 MG capsule [[id number]]. She is wondering if he is suppose to take 2pills/day for 5 days straight or take pills when needed. Please advise [name] [place] by phone [phone].

## 2020-11-05 DIAGNOSIS — E875 Hyperkalemia: Secondary | ICD-10-CM | POA: Insufficient documentation

## 2020-11-05 NOTE — Addendum Note (Signed)
Addended by: Jon Billings on: 11/05/2020 07:50 AM   Modules accepted: Orders

## 2020-11-09 ENCOUNTER — Ambulatory Visit (HOSPITAL_COMMUNITY): Payer: Medicare (Managed Care)

## 2020-11-10 ENCOUNTER — Other Ambulatory Visit: Payer: Self-pay

## 2020-11-10 ENCOUNTER — Encounter (HOSPITAL_COMMUNITY): Payer: Self-pay

## 2020-11-10 ENCOUNTER — Ambulatory Visit (HOSPITAL_COMMUNITY): Admission: RE | Admit: 2020-11-10 | Payer: Medicare (Managed Care) | Source: Ambulatory Visit

## 2021-01-02 ENCOUNTER — Telehealth: Payer: Self-pay | Admitting: Family Medicine

## 2021-01-02 ENCOUNTER — Ambulatory Visit: Payer: Medicare (Managed Care)

## 2021-01-02 NOTE — Telephone Encounter (Signed)
We received fax from after hours nurse line that [name] called for patient on 01/02/21 at 6:33:05 AM and said that they needed to cancel the appointment at 3:45pm today with no indication why. Please advise

## 2021-01-19 ENCOUNTER — Telehealth: Payer: Self-pay

## 2021-01-19 NOTE — Telephone Encounter (Signed)
Lft VM to rtn call to schedule an AWV.  Dm/cma

## 2021-01-31 ENCOUNTER — Ambulatory Visit: Payer: Medicare (Managed Care) | Admitting: Family Medicine

## 2021-02-15 ENCOUNTER — Ambulatory Visit: Payer: Medicare (Managed Care) | Admitting: Family Medicine

## 2021-02-18 ENCOUNTER — Other Ambulatory Visit: Payer: Self-pay | Admitting: Family Medicine

## 2021-02-18 DIAGNOSIS — D509 Iron deficiency anemia, unspecified: Secondary | ICD-10-CM

## 2021-02-20 ENCOUNTER — Telehealth (INDEPENDENT_AMBULATORY_CARE_PROVIDER_SITE_OTHER): Payer: Medicare (Managed Care) | Admitting: Family Medicine

## 2021-02-20 ENCOUNTER — Encounter: Payer: Self-pay | Admitting: Family Medicine

## 2021-02-20 DIAGNOSIS — M79672 Pain in left foot: Secondary | ICD-10-CM | POA: Diagnosis not present

## 2021-02-20 DIAGNOSIS — M79671 Pain in right foot: Secondary | ICD-10-CM

## 2021-02-20 DIAGNOSIS — R0981 Nasal congestion: Secondary | ICD-10-CM | POA: Diagnosis not present

## 2021-02-20 MED ORDER — GABAPENTIN 100 MG PO CAPS: 100.0000 mg | ORAL_CAPSULE | Freq: Every day | ORAL | 0 refills | Status: DC

## 2021-02-20 NOTE — Patient Instructions (Signed)
Schedule inperson follow up visit with Primary care doctor or [place] doctor in 3-4 weeks.   Change from Zyrtec to allegra for the allergies as may be less sedating.  Sent lower dose of the gabapentin. Can try Tylenol 1000mg  nightly before bed or the Gabapentin 100mg  1-3 times daily.  -I sent the medication(s) we discussed to your pharmacy: Meds ordered this encounter  Medications   gabapentin (NEURONTIN) 100 MG capsule    Sig: Take 1 capsule (100 mg total) by mouth at bedtime.    Dispense:  30 capsule    Refill:  0     I hope you are feeling better soon!  Seek in person care promptly if your symptoms worsen, new concerns arise or you are not improving with treatment.  It was nice to meet you today. I help [facility] out with telemedicine visits on Tuesdays and Thursdays and am available for visits on those days. If you have any concerns or questions following this visit please schedule a follow up visit with your Primary Care doctor or seek care at a local urgent care clinic to avoid delays in care.

## 2021-02-20 NOTE — Progress Notes (Signed)
Virtual Visit via Video Note  I connected with [name]  on 02/20/21 at  5:00 PM EDT by a video enabled telemedicine application and verified that I am speaking with the correct person using two identifiers.  Location patient: home, [place] Location provider:work or home office Persons participating in the virtual visit: patient, provider, granddaughter  I discussed the limitations of evaluation and management by telemedicine and the availability of in person appointments. The patient expressed understanding and agreed to proceed.   HPI:  Acute telemedicine visit for feet foot pain: -Onset: chronic -reports dx with neuropathy and PCP  -Symptoms include:pain and loss of sensation in the feet -gabapentin and tylenol work but he doesn't like the side effects if takes during the day -symptoms are mainly during the day  Sinus issues: -chronic -cough and sneezing -taking zyrtec as has allergies - but makes him drowsy  ROS: See pertinent positives and negatives per HPI.  Past Medical History:  Diagnosis Date   Arthritis    Atrial fibrillation ([place])    CKD (chronic kidney disease)    History of hiatal hernia    Hx SBO    Last 2013 all treated conservatively   Hypertension    Presence of permanent cardiac pacemaker    Prostate cancer [facility])    Stroke ([place]) 2017   left facial drooping noted 08/14/2016   Tachycardia-bradycardia syndrome ([place])    Type II diabetes mellitus ([place])    Ventral hernia    x3    Past Surgical History:  Procedure Laterality Date   ABDOMINAL AORTOGRAM W/LOWER EXTREMITY N/A 08/07/2016   Procedure: Abdominal Aortogram w/Lower Extremity;  Surgeon: Wellington Hampshire, MD;  Location: [place] CV LAB;  Service: Cardiovascular;  Laterality: N/A;   CHOLECYSTECTOMY OPEN  03/31/2001   Dr Lindon Romp   COLON SURGERY     EXPLORATORY LAPAROTOMY  08/2004   Archie Endo 10/01/2010   HEMORRHOID SURGERY     HERNIA REPAIR     HIATAL HERNIA REPAIR  2002   INCISIONAL HERNIA REPAIR   08/2004   Archie Endo 10/01/2010   INSERT / REPLACE / REMOVE PACEMAKER     PERIPHERAL VASCULAR BALLOON ANGIOPLASTY  08/07/2016   Procedure: Peripheral Vascular Balloon Angioplasty;  Surgeon: Wellington Hampshire, MD;  Location: [place] CV LAB;  Service: Cardiovascular;;  left popliteal artery   PERIPHERAL VASCULAR INTERVENTION  08/14/2016   popliteal artery/notes 08/14/2016   PERIPHERAL VASCULAR INTERVENTION Left 08/14/2016   Procedure: Peripheral Vascular Intervention;  Surgeon: Wellington Hampshire, MD;  Location: [place] [facility] LAB;  Service: Cardiovascular;  Laterality: Left;  popliteal artery   PERMANENT PACEMAKER GENERATOR CHANGE N/A 04/06/2012   Procedure: PERMANENT PACEMAKER GENERATOR CHANGE;  Surgeon: Evans Lance, MD;  Location: [facility] CATH LAB;  Service: Cardiovascular;  Laterality: N/A;   SMALL INTESTINE SURGERY  09/16/2004   SBO resection, VH repair     Current Outpatient Medications:    atorvastatin (LIPITOR) 10 MG tablet, Take 10 mg by mouth daily., Disp: , Rfl:    Cholecalciferol (VITAMIN D-3) 125 MCG (5000 UT) TABS, Take by mouth., Disp: , Rfl:    docusate sodium (COLACE) 100 MG capsule, Take 1 capsule (100 mg total) by mouth 2 (two) times daily., Disp: 10 capsule, Rfl: 0   FEROSUL 325 (65 Fe) MG tablet, TAKE 1 TABLET BY MOUTH EVERY OTHER DAY, Disp: 90 tablet, Rfl: 2   gabapentin (NEURONTIN) 300 MG capsule, Take 1 capsule (300 mg total) by mouth 3 (three) times daily., Disp: 90 capsule, Rfl:  3   latanoprost (XALATAN) 0.005 % ophthalmic solution, Place 1 drop into both eyes nightly., Disp: , Rfl:    metoprolol succinate (TOPROL-XL) 25 MG 24 hr tablet, TAKE 1 TABLET(25 MG) BY MOUTH EVERY EVENING, Disp: 90 tablet, Rfl: 1   niacin 500 MG tablet, Take 500 mg by mouth at bedtime., Disp: , Rfl:    pantoprazole (PROTONIX) 40 MG tablet, TAKE 1 TABLET(40 MG) BY MOUTH DAILY, Disp: 90 tablet, Rfl: 1   polyethylene glycol powder (GLYCOLAX/MIRALAX) 17 GM/SCOOP powder, Take 17 g by mouth 2 (two) times  daily as needed., Disp: 3350 g, Rfl: 1   Vitamin D, Ergocalciferol, (DRISDOL) 1.25 MG (50000 UNIT) CAPS capsule, Take 1 capsule (50,000 Units total) by mouth every 7 (seven) days., Disp: 15 capsule, Rfl: 1   XARELTO 15 MG TABS tablet, TAKE 1 TABLET(15 MG) BY MOUTH DAILY WITH SUPPER, Disp: 30 tablet, Rfl: 3   zinc gluconate 50 MG tablet, Take 50 mg by mouth daily., Disp: , Rfl:    losartan (COZAAR) 50 MG tablet, Take 1 tablet (50 mg total) by mouth daily., Disp: 90 tablet, Rfl: 0  EXAM:  VITALS per patient if applicable:  GENERAL: alert, oriented, appears well and in no acute distress  HEENT: atraumatic, conjunttiva clear, no obvious abnormalities on inspection of external nose and ears  NECK: normal movements of the head and neck  LUNGS: on inspection no signs of respiratory distress, breathing rate appears normal, no obvious gross SOB, gasping or wheezing  CV: no obvious cyanosis  MS: moves all visible extremities without noticeable abnormality  PSYCH/NEURO: pleasant and cooperative, no obvious depression or anxiety, speech and thought processing grossly intact  ASSESSMENT AND PLAN:  Discussed the following assessment and plan:  Pain in both feet  Nasal congestion  For the feet suggested can try tylenol or could try the Gabapentin only at night. They want to try a lower dose as they have only been doing at night because it makes him sleepy. Sent in 100 mg dose x 30 days. For the allergies can try Allegra instead Zyrtec. Advised follow up with PCP or [facility] oc in 3-4 weeks. Granddaughter agrees to schedule follow up with [facility] doc or PCP. Advised to seek prompt in person care if worsening, new symptoms arise, or if is not improving with treatment. Discussed options for inperson care if PCP office not available. Did let this patient know that I only do telemedicine on Tuesdays and Thursdays for [place]. Advised to schedule follow up visit with PCP or [facility] if any further questions or  concerns to avoid delays in care.   I discussed the assessment and treatment plan with the patient. The patient was provided an opportunity to ask questions and all were answered. The patient agreed with the plan and demonstrated an understanding of the instructions.     Lucretia Kern, DO

## 2021-03-30 ENCOUNTER — Telehealth: Payer: Self-pay

## 2021-03-30 NOTE — Telephone Encounter (Signed)
Returned granddaughters call (on [place]). Patient has well established device. Granddaughter will assess skin integrity around device and if remains intact she will utilize mild lotion for dry skin. If rash present she will call office Monday at [phone]. Granddaughter thankful for follow up.

## 2021-03-30 NOTE — Telephone Encounter (Signed)
The patient granddaughter Joycelyn Schmid states the patient is itching around his pacemaker site. She wants to know if she should bring him in. I asked her was it red or hot to touch and she do not think it is. She was not with the patient at the time.  I explained to her that Dr. Lovena Le is not in the office today. I told her I will have the nurse give her a call back. I also gave her the number to tech support to get additional help with his monitor because his monitor has not updated since July 28, 2020. His monitor may be outside the [employer] network. It may need to be replace. She agreed to call them today. Her phone number is [phone].

## 2021-03-31 ENCOUNTER — Encounter: Payer: Self-pay | Admitting: Family Medicine

## 2021-04-03 ENCOUNTER — Other Ambulatory Visit: Payer: Self-pay | Admitting: Family Medicine

## 2021-04-03 DIAGNOSIS — I480 Paroxysmal atrial fibrillation: Secondary | ICD-10-CM

## 2021-04-16 ENCOUNTER — Telehealth: Payer: Self-pay | Admitting: Family Medicine

## 2021-04-17 NOTE — Telephone Encounter (Signed)
Please advise message below, will patient need to come in to discuss?

## 2021-04-18 NOTE — Telephone Encounter (Signed)
Called patients grand daughter left detailed message informed that they would need to come in the office to discuss medication. Asked to call and schedule appointment.

## 2021-04-25 ENCOUNTER — Other Ambulatory Visit: Payer: Self-pay

## 2021-04-25 ENCOUNTER — Ambulatory Visit (INDEPENDENT_AMBULATORY_CARE_PROVIDER_SITE_OTHER): Payer: Medicare (Managed Care) | Admitting: Family Medicine

## 2021-04-25 ENCOUNTER — Encounter: Payer: Self-pay | Admitting: Family Medicine

## 2021-04-25 VITALS — BP 130/74 | HR 84 | Temp 97.7°F | Ht 71.0 in | Wt 150.0 lb

## 2021-04-25 DIAGNOSIS — I480 Paroxysmal atrial fibrillation: Secondary | ICD-10-CM | POA: Diagnosis not present

## 2021-04-25 DIAGNOSIS — M79672 Pain in left foot: Secondary | ICD-10-CM

## 2021-04-25 DIAGNOSIS — D509 Iron deficiency anemia, unspecified: Secondary | ICD-10-CM

## 2021-04-25 DIAGNOSIS — E875 Hyperkalemia: Secondary | ICD-10-CM | POA: Diagnosis not present

## 2021-04-25 DIAGNOSIS — E119 Type 2 diabetes mellitus without complications: Secondary | ICD-10-CM

## 2021-04-25 DIAGNOSIS — E538 Deficiency of other specified B group vitamins: Secondary | ICD-10-CM | POA: Diagnosis not present

## 2021-04-25 DIAGNOSIS — M79671 Pain in right foot: Secondary | ICD-10-CM

## 2021-04-25 DIAGNOSIS — N1832 Chronic kidney disease, stage 3b: Secondary | ICD-10-CM | POA: Diagnosis not present

## 2021-04-25 DIAGNOSIS — N3945 Continuous leakage: Secondary | ICD-10-CM

## 2021-04-25 DIAGNOSIS — G629 Polyneuropathy, unspecified: Secondary | ICD-10-CM

## 2021-04-25 DIAGNOSIS — Z9181 History of falling: Secondary | ICD-10-CM

## 2021-04-25 LAB — CBC
HCT: 33.4 % — ABNORMAL LOW (ref 39.0–52.0)
Hemoglobin: 10.6 g/dL — ABNORMAL LOW (ref 13.0–17.0)
MCHC: 31.6 g/dL (ref 30.0–36.0)
MCV: 78.9 fl (ref 78.0–100.0)
Platelets: 131 10*3/uL — ABNORMAL LOW (ref 150.0–400.0)
RBC: 4.24 Mil/uL (ref 4.22–5.81)
RDW: 16.9 % — ABNORMAL HIGH (ref 11.5–15.5)
WBC: 6.4 10*3/uL (ref 4.0–10.5)

## 2021-04-25 LAB — BASIC METABOLIC PANEL
BUN: 24 mg/dL — ABNORMAL HIGH (ref 6–23)
CO2: 27 mEq/L (ref 19–32)
Calcium: 8.3 mg/dL — ABNORMAL LOW (ref 8.4–10.5)
Chloride: 110 mEq/L (ref 96–112)
Creatinine, Ser: 1.53 mg/dL — ABNORMAL HIGH (ref 0.40–1.50)
GFR: 38.5 mL/min — ABNORMAL LOW (ref >60.00)
Glucose, Bld: 125 mg/dL — ABNORMAL HIGH (ref 70–99)
Potassium: 4.7 mEq/L (ref 3.5–5.1)
Sodium: 142 mEq/L (ref 135–145)

## 2021-04-25 LAB — VITAMIN B12: Vitamin B-12: 260 pg/mL (ref 211–911)

## 2021-04-25 NOTE — Progress Notes (Signed)
Established Patient Office Visit  Subjective:  Patient ID: [name], male    DOB: 07-Oct-1923  Age: 87 y.o. MRN: [id number]  CC:  Chief Complaint  Patient presents with   Follow-up    Follow up on medications would like to discuss Xarelto [facility] would like to stop this medication.     HPI [name] presents for follow-up of diabetes, stage IIIb chronic kidney disease, continuous urinary incontinence,, B12 deficiency, iron deficiency anemia, at risk for falls and neuropathy of feet.  Continues with Neurontin for neuropathy.  Experiences nighttime cramps in his feet that are relieved with stretching.  Rechecking elevated potassium.  Rechecking hemoglobin A1c.  May need to consider treatment as needed results of hemoglobin A1c.  [facility] doctor had suggested that he discontinue the Xarelto in favor of aspirin and Eliquis due to his risk for falls.  Past Medical History:  Diagnosis Date   Arthritis    Atrial fibrillation ([place])    CKD (chronic kidney disease)    History of hiatal hernia    Hx SBO    Last 2013 all treated conservatively   Hypertension    Presence of permanent cardiac pacemaker    [facility])    Stroke ([place]) 2017   left facial drooping noted 08/14/2016   Tachycardia-bradycardia syndrome ([place])    Type II diabetes mellitus ([place])    Ventral hernia    x3    Past Surgical History:  Procedure Laterality Date   ABDOMINAL AORTOGRAM W/LOWER EXTREMITY N/A 08/07/2016   Procedure: Abdominal Aortogram w/Lower Extremity;  Surgeon: Wellington Hampshire, MD;  Location: [place] [facility];  Service: Cardiovascular;  Laterality: N/A;   CHOLECYSTECTOMY OPEN  03/31/2001   Dr Lindon Romp   COLON SURGERY     EXPLORATORY LAPAROTOMY  08/2004   Archie Endo 10/01/2010   HEMORRHOID SURGERY     HERNIA REPAIR     HIATAL HERNIA REPAIR  2002   INCISIONAL HERNIA REPAIR  08/2004   Archie Endo 10/01/2010   INSERT / REPLACE / REMOVE PACEMAKER     PERIPHERAL VASCULAR BALLOON ANGIOPLASTY  08/07/2016    Procedure: Peripheral Vascular Balloon Angioplasty;  Surgeon: Wellington Hampshire, MD;  Location: [place] [facility] LAB;  Service: Cardiovascular;;  left popliteal artery   PERIPHERAL VASCULAR INTERVENTION  08/14/2016   popliteal artery/notes 08/14/2016   PERIPHERAL VASCULAR INTERVENTION Left 08/14/2016   Procedure: Peripheral Vascular Intervention;  Surgeon: Wellington Hampshire, MD;  Location: [place] [facility] LAB;  Service: Cardiovascular;  Laterality: Left;  popliteal artery   PERMANENT PACEMAKER GENERATOR CHANGE N/A 04/06/2012   Procedure: PERMANENT PACEMAKER GENERATOR CHANGE;  Surgeon: Evans Lance, MD;  Location: [facility] CATH LAB;  Service: Cardiovascular;  Laterality: N/A;   SMALL INTESTINE SURGERY  09/16/2004   SBO resection, VH repair    Family History  Problem Relation Age of Onset   Pneumonia Father    Stroke Brother    Stroke Sister    Cancer Brother        type unknown    Social History   Socioeconomic History   Marital status: Widowed    Spouse name: Not on file   Number of children: Not on file   Years of education: Not on file   Highest education level: Not on file  Occupational History   Not on file  Tobacco Use   Smoking status: Former    Packs/day: 1.50    Years: 15.00    Pack years: 22.50  Types: Cigarettes    Quit date: 04/16/1955    Years since quitting: 66.0   Smokeless tobacco: Former    Types: Chew    Quit date: 08/17/1952  Substance and Sexual Activity   Alcohol use: No    Comment: 08/14/2016 "drank beer when I was a teenager"   Drug use: No   Sexual activity: Not on file  Other Topics Concern   Not on file  Social History Narrative   Lives alone   Social Determinants of Health   Financial Resource Strain: Not on file  Food Insecurity: Not on file  Transportation Needs: Not on file  Physical Activity: Not on file  Stress: Not on file  Social Connections: Not on file  Intimate Partner Violence: Not on file    Outpatient Medications Prior to Visit   Medication Sig Dispense Refill   atorvastatin (LIPITOR) 10 MG tablet Take 10 mg by mouth daily.     Cholecalciferol (VITAMIN D-3) 125 MCG (5000 UT) TABS Take by mouth.     docusate sodium (COLACE) 100 MG capsule Take 1 capsule (100 mg total) by mouth 2 (two) times daily. 10 capsule 0   FEROSUL 325 (65 Fe) MG tablet TAKE 1 TABLET BY MOUTH EVERY OTHER DAY 90 tablet 2   gabapentin (NEURONTIN) 100 MG capsule Take 1 capsule (100 mg total) by mouth at bedtime. 30 capsule 0   latanoprost (XALATAN) 0.005 % ophthalmic solution Place 1 drop into both eyes nightly.     losartan (COZAAR) 50 MG tablet Take 1 tablet (50 mg total) by mouth daily. 90 tablet 0   metoprolol succinate (TOPROL-XL) 25 MG 24 hr tablet TAKE 1 TABLET(25 MG) BY MOUTH EVERY EVENING 90 tablet 1   Nutritional Supplements (GLUCERNA ADVANCE SHAKE PO) Take by mouth.     pantoprazole (PROTONIX) 40 MG tablet TAKE 1 TABLET(40 MG) BY MOUTH DAILY 90 tablet 1   polyethylene glycol powder (GLYCOLAX/MIRALAX) 17 GM/SCOOP powder Take 17 g by mouth 2 (two) times daily as needed. 3350 g 1   Vitamin D, Ergocalciferol, (DRISDOL) 1.25 MG (50000 UNIT) CAPS capsule Take 1 capsule (50,000 Units total) by mouth every 7 (seven) days. 15 capsule 1   XARELTO 15 MG TABS tablet TAKE 1 TABLET(15 MG) BY MOUTH DAILY WITH SUPPER 30 tablet 3   niacin 500 MG tablet Take 500 mg by mouth at bedtime. (Patient not taking: Reported on 04/25/2021)     zinc gluconate 50 MG tablet Take 50 mg by mouth daily. (Patient not taking: Reported on 04/25/2021)     No facility-administered medications prior to visit.    No Known Allergies  ROS Review of Systems  Constitutional:  Negative for diaphoresis, fatigue, fever and unexpected weight change.  HENT: Negative.    Respiratory:  Negative for shortness of breath and wheezing.   Cardiovascular:  Negative for chest pain and palpitations.  Gastrointestinal:  Negative for abdominal pain.  Genitourinary:  Positive for frequency and  urgency. Negative for difficulty urinating and dysuria.  Musculoskeletal:  Positive for arthralgias, gait problem and myalgias.  Neurological:  Negative for speech difficulty.  Psychiatric/Behavioral: Negative.       Objective:    Physical Exam Vitals and nursing note reviewed.  Constitutional:      General: He is not in acute distress.    Appearance: Normal appearance. He is not ill-appearing, toxic-appearing or diaphoretic.  HENT:     Head: Normocephalic and atraumatic.     Right Ear: External ear normal.  Left Ear: External ear normal.  Eyes:     General: No scleral icterus.       Right eye: No discharge.     Conjunctiva/sclera: Conjunctivae normal.  Cardiovascular:     Rate and Rhythm: Rhythm irregularly irregular.  Pulmonary:     Effort: Pulmonary effort is normal.     Breath sounds: Normal breath sounds.  Musculoskeletal:     Right lower leg: No edema.     Left lower leg: No edema.  Neurological:     Mental Status: He is alert. Mental status is at baseline.  Psychiatric:        Mood and Affect: Mood normal.        Behavior: Behavior normal.    BP 130/74 (BP Location: Left Arm, Patient Position: Sitting, Cuff Size: Normal)   Pulse 84   Temp 97.7 F (36.5 C) (Temporal)   Ht 5\' 11"  (1.803 m)   Wt 150 lb (68 kg)   SpO2 99%   BMI 20.92 kg/m  Wt Readings from Last 3 Encounters:  04/25/21 150 lb (68 kg)  10/30/20 144 lb 3.2 oz (65.4 kg)  09/21/20 149 lb 9.6 oz (67.9 kg)     Health Maintenance Due  Topic Date Due   Pneumonia Vaccine 16+ Years old (1 - PCV) Never done   Zoster Vaccines- Shingrix (1 of 2) Never done    There are no preventive care reminders to display for this patient.  Lab Results  Component Value Date   TSH 2.80 03/21/2020   Lab Results  Component Value Date   WBC 6.5 10/30/2020   HGB 10.7 (L) 10/30/2020   HCT 33.3 (L) 10/30/2020   MCV 77.8 (L) 10/30/2020   PLT 135.0 (L) 10/30/2020   Lab Results  Component Value Date   NA 142  10/30/2020   K 5.8 (H) 10/30/2020   CO2 30 10/30/2020   GLUCOSE 134 (H) 10/30/2020   BUN 21 10/30/2020   CREATININE 1.72 (H) 10/30/2020   BILITOT 0.6 12/05/2017   ALKPHOS 57 12/05/2017   AST 20 12/05/2017   ALT 13 12/05/2017   PROT 6.3 (L) 12/05/2017   ALBUMIN 3.3 (L) 12/05/2017   CALCIUM 8.8 10/30/2020   ANIONGAP 8 05/24/2020   GFR 33.57 (L) 10/30/2020   Lab Results  Component Value Date   CHOL 122 07/23/2015   Lab Results  Component Value Date   HDL 43 07/23/2015   Lab Results  Component Value Date   LDLCALC 68 07/23/2015   Lab Results  Component Value Date   TRIG 55 07/23/2015   Lab Results  Component Value Date   CHOLHDL 2.8 07/23/2015   Lab Results  Component Value Date   HGBA1C 7.4 (H) 10/30/2020      Assessment & Plan:   Problem List Items Addressed This Visit       Cardiovascular and Mediastinum   Atrial fibrillation ([place])   Relevant Orders   Basic metabolic panel   CBC     Endocrine   Type 2 diabetes mellitus without complication, without long-term current use of insulin ([name])   Relevant Orders   Basic metabolic panel   Hemoglobin A1c     Nervous and Auditory   Neuropathy     Genitourinary   Stage 3b chronic kidney disease ([name])   Relevant Orders   Basic metabolic panel     Other   Iron deficiency anemia   Relevant Orders   Iron, TIBC and Ferritin Panel   Pain in  both feet - Primary   B12 deficiency   Relevant Orders   Vitamin B12   Hyperkalemia   Relevant Orders   Basic metabolic panel   Continuous leakage of urine   At high risk for injury related to fall    No orders of the defined types were placed in this encounter.   Follow-up: Return in about 3 months (around 07/24/2021).  At this point would recommend continuing with Xarelto.  Not sure that changing to Eliquis with aspirin would be any safer regarding fall risk.  May need to consider diabetes treatment.  May need to consider referral to nephrology if hyperkalemia is  not resolved.  [name], MD

## 2021-04-26 LAB — HEMOGLOBIN A1C: Hgb A1c MFr Bld: 7.7 % — ABNORMAL HIGH (ref 4.6–6.5)

## 2021-04-27 LAB — IRON,TIBC AND FERRITIN PANEL
%SAT: 28 % (calc) (ref 20–48)
Ferritin: 91 ng/mL (ref 24–380)
Iron: 65 ug/dL (ref 50–180)
TIBC: 232 mcg/dL (calc) — ABNORMAL LOW (ref 250–425)

## 2021-05-07 ENCOUNTER — Ambulatory Visit (INDEPENDENT_AMBULATORY_CARE_PROVIDER_SITE_OTHER): Payer: Medicare (Managed Care)

## 2021-05-07 DIAGNOSIS — I639 Cerebral infarction, unspecified: Secondary | ICD-10-CM | POA: Diagnosis not present

## 2021-05-08 LAB — CUP PACEART REMOTE DEVICE CHECK
Battery Impedance: 5117 Ohm
Battery Remaining Longevity: 9 mo
Battery Voltage: 2.66 V
Brady Statistic RV Percent Paced: 14 %
Date Time Interrogation Session: 20221219125244
Implantable Lead Implant Date: 20060510
Implantable Lead Location: 753860
Implantable Lead Model: 5076
Implantable Pulse Generator Implant Date: 20131118
Lead Channel Impedance Value: 0 Ohm
Lead Channel Impedance Value: 483 Ohm
Lead Channel Pacing Threshold Amplitude: 0.625 V
Lead Channel Pacing Threshold Pulse Width: 0.4 ms
Lead Channel Setting Pacing Amplitude: 2.5 V
Lead Channel Setting Pacing Pulse Width: 0.4 ms
Lead Channel Setting Sensing Sensitivity: 5.6 mV

## 2021-05-16 NOTE — Progress Notes (Signed)
Remote pacemaker transmission.   

## 2021-06-13 ENCOUNTER — Institutional Professional Consult (permissible substitution): Payer: Medicare Other | Admitting: Neurology

## 2021-06-22 DIAGNOSIS — I739 Peripheral vascular disease, unspecified: Secondary | ICD-10-CM | POA: Diagnosis not present

## 2021-06-22 DIAGNOSIS — B351 Tinea unguium: Secondary | ICD-10-CM | POA: Diagnosis not present

## 2021-06-22 DIAGNOSIS — L603 Nail dystrophy: Secondary | ICD-10-CM | POA: Diagnosis not present

## 2021-06-22 DIAGNOSIS — E1142 Type 2 diabetes mellitus with diabetic polyneuropathy: Secondary | ICD-10-CM | POA: Diagnosis not present

## 2021-06-22 DIAGNOSIS — M792 Neuralgia and neuritis, unspecified: Secondary | ICD-10-CM | POA: Diagnosis not present

## 2021-06-22 DIAGNOSIS — L84 Corns and callosities: Secondary | ICD-10-CM | POA: Diagnosis not present

## 2021-07-24 ENCOUNTER — Ambulatory Visit: Payer: Medicare (Managed Care) | Admitting: Family Medicine

## 2021-07-29 ENCOUNTER — Inpatient Hospital Stay (HOSPITAL_COMMUNITY): Payer: Medicare PPO

## 2021-07-29 ENCOUNTER — Emergency Department (HOSPITAL_COMMUNITY): Payer: Medicare PPO

## 2021-07-29 ENCOUNTER — Encounter: Payer: Self-pay | Admitting: Family Medicine

## 2021-07-29 ENCOUNTER — Other Ambulatory Visit: Payer: Self-pay

## 2021-07-29 ENCOUNTER — Inpatient Hospital Stay (HOSPITAL_COMMUNITY)
Admission: EM | Admit: 2021-07-29 | Discharge: 2021-08-01 | DRG: 394 | Disposition: A | Discharge status: Home Health | Payer: Medicare PPO | Attending: Internal Medicine | Admitting: Internal Medicine

## 2021-07-29 ENCOUNTER — Encounter (HOSPITAL_COMMUNITY): Payer: Self-pay | Admitting: Emergency Medicine

## 2021-07-29 DIAGNOSIS — M1990 Unspecified osteoarthritis, unspecified site: Secondary | ICD-10-CM | POA: Diagnosis present

## 2021-07-29 DIAGNOSIS — I495 Sick sinus syndrome: Secondary | ICD-10-CM | POA: Diagnosis not present

## 2021-07-29 DIAGNOSIS — K8689 Other specified diseases of pancreas: Secondary | ICD-10-CM | POA: Diagnosis not present

## 2021-07-29 DIAGNOSIS — K436 Other and unspecified ventral hernia with obstruction, without gangrene: Secondary | ICD-10-CM | POA: Diagnosis not present

## 2021-07-29 DIAGNOSIS — Z20822 Contact with and (suspected) exposure to covid-19: Secondary | ICD-10-CM | POA: Diagnosis present

## 2021-07-29 DIAGNOSIS — Z8546 Personal history of malignant neoplasm of prostate: Secondary | ICD-10-CM

## 2021-07-29 DIAGNOSIS — R000 Tachycardia, unspecified: Secondary | ICD-10-CM | POA: Diagnosis not present

## 2021-07-29 DIAGNOSIS — E119 Type 2 diabetes mellitus without complications: Secondary | ICD-10-CM | POA: Diagnosis not present

## 2021-07-29 DIAGNOSIS — I130 Hypertensive heart and chronic kidney disease with heart failure and stage 1 through stage 4 chronic kidney disease, or unspecified chronic kidney disease: Secondary | ICD-10-CM | POA: Diagnosis not present

## 2021-07-29 DIAGNOSIS — Z4682 Encounter for fitting and adjustment of non-vascular catheter: Secondary | ICD-10-CM | POA: Diagnosis not present

## 2021-07-29 DIAGNOSIS — Z720 Tobacco use: Secondary | ICD-10-CM | POA: Diagnosis not present

## 2021-07-29 DIAGNOSIS — Z79899 Other long term (current) drug therapy: Secondary | ICD-10-CM | POA: Diagnosis not present

## 2021-07-29 DIAGNOSIS — K420 Umbilical hernia with obstruction, without gangrene: Secondary | ICD-10-CM | POA: Diagnosis not present

## 2021-07-29 DIAGNOSIS — Z950 Presence of cardiac pacemaker: Secondary | ICD-10-CM

## 2021-07-29 DIAGNOSIS — R1084 Generalized abdominal pain: Secondary | ICD-10-CM | POA: Diagnosis not present

## 2021-07-29 DIAGNOSIS — I10 Essential (primary) hypertension: Secondary | ICD-10-CM | POA: Diagnosis not present

## 2021-07-29 DIAGNOSIS — N1832 Chronic kidney disease, stage 3b: Secondary | ICD-10-CM | POA: Diagnosis not present

## 2021-07-29 DIAGNOSIS — Z978 Presence of other specified devices: Secondary | ICD-10-CM

## 2021-07-29 DIAGNOSIS — Z8673 Personal history of transient ischemic attack (TIA), and cerebral infarction without residual deficits: Secondary | ICD-10-CM | POA: Diagnosis not present

## 2021-07-29 DIAGNOSIS — I4891 Unspecified atrial fibrillation: Secondary | ICD-10-CM | POA: Diagnosis not present

## 2021-07-29 DIAGNOSIS — F0390 Unspecified dementia without behavioral disturbance: Secondary | ICD-10-CM | POA: Diagnosis present

## 2021-07-29 DIAGNOSIS — E1122 Type 2 diabetes mellitus with diabetic chronic kidney disease: Secondary | ICD-10-CM | POA: Diagnosis not present

## 2021-07-29 DIAGNOSIS — Z823 Family history of stroke: Secondary | ICD-10-CM | POA: Diagnosis not present

## 2021-07-29 DIAGNOSIS — I5032 Chronic diastolic (congestive) heart failure: Secondary | ICD-10-CM | POA: Diagnosis not present

## 2021-07-29 DIAGNOSIS — K449 Diaphragmatic hernia without obstruction or gangrene: Secondary | ICD-10-CM | POA: Diagnosis not present

## 2021-07-29 DIAGNOSIS — R935 Abnormal findings on diagnostic imaging of other abdominal regions, including retroperitoneum: Secondary | ICD-10-CM | POA: Diagnosis not present

## 2021-07-29 DIAGNOSIS — K56609 Unspecified intestinal obstruction, unspecified as to partial versus complete obstruction: Secondary | ICD-10-CM | POA: Diagnosis not present

## 2021-07-29 DIAGNOSIS — K5900 Constipation, unspecified: Secondary | ICD-10-CM | POA: Diagnosis not present

## 2021-07-29 DIAGNOSIS — Z9114 Patient's other noncompliance with medication regimen: Secondary | ICD-10-CM

## 2021-07-29 DIAGNOSIS — D509 Iron deficiency anemia, unspecified: Secondary | ICD-10-CM | POA: Diagnosis present

## 2021-07-29 DIAGNOSIS — I482 Chronic atrial fibrillation, unspecified: Secondary | ICD-10-CM

## 2021-07-29 DIAGNOSIS — R140 Abdominal distension (gaseous): Secondary | ICD-10-CM | POA: Diagnosis not present

## 2021-07-29 DIAGNOSIS — Z7901 Long term (current) use of anticoagulants: Secondary | ICD-10-CM | POA: Diagnosis not present

## 2021-07-29 DIAGNOSIS — K439 Ventral hernia without obstruction or gangrene: Secondary | ICD-10-CM

## 2021-07-29 DIAGNOSIS — K6389 Other specified diseases of intestine: Secondary | ICD-10-CM | POA: Diagnosis not present

## 2021-07-29 DIAGNOSIS — I5033 Acute on chronic diastolic (congestive) heart failure: Secondary | ICD-10-CM | POA: Diagnosis present

## 2021-07-29 DIAGNOSIS — K5669 Other partial intestinal obstruction: Secondary | ICD-10-CM | POA: Diagnosis not present

## 2021-07-29 DIAGNOSIS — I517 Cardiomegaly: Secondary | ICD-10-CM | POA: Diagnosis not present

## 2021-07-29 LAB — CBC WITH DIFFERENTIAL/PLATELET
Abs Immature Granulocytes: 0.03 10*3/uL (ref 0.00–0.07)
Basophils Absolute: 0 10*3/uL (ref 0.0–0.1)
Basophils Relative: 0 %
Eosinophils Absolute: 0 10*3/uL (ref 0.0–0.5)
Eosinophils Relative: 0 %
HCT: 38.6 % — ABNORMAL LOW (ref 39.0–52.0)
Hemoglobin: 12.8 g/dL — ABNORMAL LOW (ref 13.0–17.0)
Immature Granulocytes: 0 %
Lymphocytes Relative: 6 %
Lymphs Abs: 0.7 10*3/uL (ref 0.7–4.0)
MCH: 25.8 pg — ABNORMAL LOW (ref 26.0–34.0)
MCHC: 33.2 g/dL (ref 30.0–36.0)
MCV: 77.8 fL — ABNORMAL LOW (ref 80.0–100.0)
Monocytes Absolute: 0.8 10*3/uL (ref 0.1–1.0)
Monocytes Relative: 6 %
Neutro Abs: 10.7 10*3/uL — ABNORMAL HIGH (ref 1.7–7.7)
Neutrophils Relative %: 88 %
Platelets: 171 10*3/uL (ref 150–400)
RBC: 4.96 MIL/uL (ref 4.22–5.81)
RDW: 16.9 % — ABNORMAL HIGH (ref 11.5–15.5)
WBC: 12.3 10*3/uL — ABNORMAL HIGH (ref 4.0–10.5)
nRBC: 0 % (ref 0.0–0.2)

## 2021-07-29 LAB — RESP PANEL BY RT-PCR (FLU A&B, COVID) ARPGX2
Influenza A by PCR: NEGATIVE
Influenza B by PCR: NEGATIVE
SARS Coronavirus 2 by RT PCR: NEGATIVE

## 2021-07-29 LAB — COMPREHENSIVE METABOLIC PANEL
ALT: 12 U/L (ref 0–44)
AST: 27 U/L (ref 15–41)
Albumin: 4.5 g/dL (ref 3.5–5.0)
Alkaline Phosphatase: 95 U/L (ref 38–126)
Anion gap: 9 (ref 5–15)
BUN: 24 mg/dL — ABNORMAL HIGH (ref 8–23)
CO2: 28 mmol/L (ref 22–32)
Calcium: 9.2 mg/dL (ref 8.9–10.3)
Chloride: 101 mmol/L (ref 98–111)
Creatinine, Ser: 1.7 mg/dL — ABNORMAL HIGH (ref 0.61–1.24)
GFR, Estimated: 37 mL/min — ABNORMAL LOW (ref >60)
Glucose, Bld: 189 mg/dL — ABNORMAL HIGH (ref 70–99)
Potassium: 4.6 mmol/L (ref 3.5–5.1)
Sodium: 138 mmol/L (ref 135–145)
Total Bilirubin: 0.7 mg/dL (ref 0.3–1.2)
Total Protein: 8 g/dL (ref 6.5–8.1)

## 2021-07-29 LAB — LIPASE, BLOOD: Lipase: 33 U/L (ref 11–51)

## 2021-07-29 LAB — PROTIME-INR
INR: 1.1 (ref 0.8–1.2)
Prothrombin Time: 14.7 seconds (ref 11.4–15.2)

## 2021-07-29 LAB — APTT: aPTT: 33 seconds (ref 24–36)

## 2021-07-29 LAB — URINALYSIS, ROUTINE W REFLEX MICROSCOPIC
Bilirubin Urine: NEGATIVE
Glucose, UA: NEGATIVE mg/dL
Hgb urine dipstick: NEGATIVE
Ketones, ur: 5 mg/dL — AB
Leukocytes,Ua: NEGATIVE
Nitrite: NEGATIVE
Protein, ur: NEGATIVE mg/dL
Specific Gravity, Urine: 1.012 (ref 1.005–1.030)
pH: 8 (ref 5.0–8.0)

## 2021-07-29 LAB — PHOSPHORUS: Phosphorus: 2.3 mg/dL — ABNORMAL LOW (ref 2.5–4.6)

## 2021-07-29 LAB — MAGNESIUM: Magnesium: 2.8 mg/dL — ABNORMAL HIGH (ref 1.7–2.4)

## 2021-07-29 LAB — LACTIC ACID, PLASMA
Lactic Acid, Venous: 1.7 mmol/L (ref 0.5–1.9)
Lactic Acid, Venous: 1.5 mmol/L (ref 0.5–1.9)

## 2021-07-29 MED ORDER — LACTATED RINGERS IV SOLN: INTRAVENOUS | Status: DC

## 2021-07-29 MED ORDER — POTASSIUM PHOSPHATES 15 MMOLE/5ML IV SOLN
15.0000 mmol | Freq: Once | INTRAVENOUS | Status: AC
  Administered 2021-07-29: 15 mmol via INTRAVENOUS
  Filled 2021-07-29: qty 5

## 2021-07-29 MED ORDER — ONDANSETRON HCL 4 MG/2ML IJ SOLN: 4.0000 mg | Freq: Four times a day (QID) | INTRAMUSCULAR | Status: DC | PRN

## 2021-07-29 MED ORDER — LACTATED RINGERS IV SOLN: INTRAVENOUS | Status: AC

## 2021-07-29 MED ORDER — FENTANYL CITRATE PF 50 MCG/ML IJ SOSY: 25.0000 ug | PREFILLED_SYRINGE | INTRAMUSCULAR | Status: DC | PRN

## 2021-07-29 MED ORDER — FENTANYL CITRATE PF 50 MCG/ML IJ SOSY
50.0000 ug | PREFILLED_SYRINGE | Freq: Once | INTRAMUSCULAR | Status: AC
Start: 2021-07-29 — End: 2021-07-29
  Administered 2021-07-29: 50 ug via INTRAVENOUS
  Filled 2021-07-29: qty 1

## 2021-07-29 MED ORDER — ACETAMINOPHEN 325 MG PO TABS
650.0000 mg | ORAL_TABLET | Freq: Four times a day (QID) | ORAL | Status: DC | PRN
  Administered 2021-07-30: 650 mg via ORAL
  Filled 2021-07-29: qty 2

## 2021-07-29 MED ORDER — ACETAMINOPHEN 650 MG RE SUPP
650.0000 mg | Freq: Four times a day (QID) | RECTAL | Status: DC | PRN
Start: 2021-07-29 — End: 2021-08-01

## 2021-07-29 MED ORDER — FENTANYL CITRATE PF 50 MCG/ML IJ SOSY
50.0000 ug | PREFILLED_SYRINGE | INTRAMUSCULAR | Status: DC | PRN
  Administered 2021-07-29: 50 ug via INTRAVENOUS
  Filled 2021-07-29: qty 1

## 2021-07-29 MED ORDER — SODIUM CHLORIDE 0.9 % IV BOLUS
1000.0000 mL | Freq: Once | INTRAVENOUS | Status: AC
  Administered 2021-07-29: 1000 mL via INTRAVENOUS

## 2021-07-29 MED ORDER — PANTOPRAZOLE SODIUM 40 MG IV SOLR
40.0000 mg | Freq: Every day | INTRAVENOUS | Status: DC
  Administered 2021-07-29 – 2021-08-01 (×4): 40 mg via INTRAVENOUS
  Filled 2021-07-29 (×4): qty 10

## 2021-07-29 MED ORDER — ONDANSETRON HCL 4 MG/2ML IJ SOLN
4.0000 mg | Freq: Once | INTRAMUSCULAR | Status: AC
  Administered 2021-07-29: 4 mg via INTRAVENOUS
  Filled 2021-07-29: qty 2

## 2021-07-29 MED ORDER — ORAL CARE MOUTH RINSE
15.0000 mL | Freq: Two times a day (BID) | OROMUCOSAL | Status: DC
  Administered 2021-07-29 – 2021-08-01 (×6): 15 mL via OROMUCOSAL

## 2021-07-29 MED ORDER — METOPROLOL TARTRATE 5 MG/5ML IV SOLN
2.5000 mg | Freq: Four times a day (QID) | INTRAVENOUS | Status: DC
  Administered 2021-07-29 – 2021-08-01 (×11): 2.5 mg via INTRAVENOUS
  Filled 2021-07-29 (×12): qty 5

## 2021-07-29 MED ORDER — MELATONIN 5 MG PO TABS
5.0000 mg | ORAL_TABLET | Freq: Once | ORAL | Status: AC
  Administered 2021-07-29: 5 mg
  Filled 2021-07-29: qty 1

## 2021-07-29 NOTE — Progress Notes (Signed)
XR from ER for NGT Placement discussed with attending. NGT putting little drainage out at this time.  ?Orders to advance the tube 7-8cm then obtain repeat XR for placement verification.  ?Once advanced, approx 350cc of yellow-brown drained into suction canister.  ?Pt tolerated advancement without issue.  ?Await repeat XR.  ?

## 2021-07-29 NOTE — ED Notes (Signed)
Pt transported to CT ?

## 2021-07-29 NOTE — ED Triage Notes (Signed)
Pt BIB EMS from home with c/o abdominal pain and constipation. Pt reports his last BM was a week ago. Pt A&O x4. ? ?BP 160/82 ?HR 110 ?CBG 223 ? ?

## 2021-07-29 NOTE — H&P (Signed)
History and Physical    Patient: [name] IWP:[id number] DOB: November 01, 1923 DOA: 07/29/2021 DOS: the patient was seen and examined on 07/29/2021 PCP: [name], MD  Patient coming from: Home  Chief Complaint:  Chief Complaint  Patient presents with   Abdominal Pain   Constipation   HPI: [name] is a 88 y.o. male with medical history significant of osteoarthritis, chronic atrial fibrillation on Xarelto, stage III CKD, hiatal hernia, ventral hernia x3, hypertension, pacemaker placement, tachycardia-bradycardia syndrome, type 2 diabetes, history of previous SBO who was brought to the emergency department due to generalized abdominal pain, abdominal distention, nausea/emesis today and constipation for 4 days.  History is taken mostly from the granddaughter.  He stated that his pain was much better after NG to suction him.  Denied headache, chest or back pain.  No flank pain or dysuria.  No polyuria, polydipsia, polyphagia or blurred vision.  ED course: ED course: Initial vital signs were temperature 98 F, pulse 106, respiration 21, BP 131/96 mmHg and O2 sat 98% on room air.  The patient received fentanyl 50 mcg IVP, ondansetron 4 mg IVP and 1000 mL of NS bolus.  Lab work: His urinalysis showed ketonuria 5 mg/dL, but was otherwise normal.  CBC with a white count 12.3, hemoglobin 12.8 g/dL and platelets 171.  PT, INR and PTT unremarkable.  Lactic acid was normal.  CMP with a glucose of 189, BUN 24 and creatinine 1.70 mg/dL.  The rest of the CMP was normal.  Phosphorus 2.3 mg/dL.  Imaging: CT abdomen/pelvis showed high-grade distal SBO due to right [place] umbilical ventral hernia, which contains a loop of small bowel.  No evidence of bowel ischemia.  There were 2 additional right epigastric ventral hernias.  All the hernias were reduced easily by Dr. [name].   Review of Systems: As mentioned in the history of present illness. All other systems reviewed and are  negative. Past Medical History:  Diagnosis Date   Arthritis    Atrial fibrillation ([place])    CKD (chronic kidney disease)    History of hiatal hernia    Hx SBO    Last 2013 all treated conservatively   Hypertension    Presence of permanent cardiac pacemaker    Prostate cancer [facility])    Stroke ([place]) 2017   left facial drooping noted 08/14/2016   Tachycardia-bradycardia syndrome ([place])    Type II diabetes mellitus ([place])    Ventral hernia    x3   Past Surgical History:  Procedure Laterality Date   ABDOMINAL AORTOGRAM W/LOWER EXTREMITY N/A 08/07/2016   Procedure: Abdominal Aortogram w/Lower Extremity;  Surgeon: Wellington Hampshire, MD;  Location: [place] [facility];  Service: Cardiovascular;  Laterality: N/A;   CHOLECYSTECTOMY OPEN  03/31/2001   Dr Lindon Romp   COLON SURGERY     EXPLORATORY LAPAROTOMY  08/2004   Archie Endo 10/01/2010   HEMORRHOID SURGERY     HERNIA REPAIR     HIATAL HERNIA REPAIR  2002   INCISIONAL HERNIA REPAIR  08/2004   Archie Endo 10/01/2010   INSERT / REPLACE / REMOVE PACEMAKER     PERIPHERAL VASCULAR BALLOON ANGIOPLASTY  08/07/2016   Procedure: Peripheral Vascular Balloon Angioplasty;  Surgeon: Wellington Hampshire, MD;  Location: [place] [facility];  Service: Cardiovascular;;  left popliteal artery   PERIPHERAL VASCULAR INTERVENTION  08/14/2016   popliteal artery/notes 08/14/2016   PERIPHERAL VASCULAR INTERVENTION Left 08/14/2016   Procedure: Peripheral Vascular Intervention;  Surgeon: Wellington Hampshire, MD;  Location:  [place] [facility];  Service: Cardiovascular;  Laterality: Left;  popliteal artery   PERMANENT PACEMAKER GENERATOR CHANGE N/A 04/06/2012   Procedure: PERMANENT PACEMAKER GENERATOR CHANGE;  Surgeon: Evans Lance, MD;  Location: [facility];  Service: Cardiovascular;  Laterality: N/A;   SMALL INTESTINE SURGERY  09/16/2004   SBO resection, VH repair   Social History:  reports that he quit smoking about 66 years ago. His smoking use included cigarettes. He has a 22.50  pack-year smoking history. He quit smokeless tobacco use about 68 years ago.  His smokeless tobacco use included chew. He reports that he does not drink alcohol and does not use drugs.  No Known Allergies  Family History  Problem Relation Age of Onset   Pneumonia Father    Stroke Brother    Stroke Sister    Cancer Brother        type unknown    Prior to Admission medications   Medication Sig Start Date End Date Taking? Authorizing Provider  latanoprost (XALATAN) 0.005 % ophthalmic solution Place 1 drop into both eyes nightly. 05/03/20  Yes [provider]  Nutritional Supplements (GLUCERNA ADVANCE SHAKE PO) Take by mouth.   Yes [provider]  XARELTO 15 MG TABS tablet TAKE 1 TABLET(15 MG) BY MOUTH DAILY WITH SUPPER Patient taking differently: 15 mg daily with supper. 04/03/21  Yes [name], MD  atorvastatin (LIPITOR) 10 MG tablet Take 10 mg by mouth daily. Patient not taking: Reported on 07/29/2021 05/09/20   [provider]  Cholecalciferol (VITAMIN D-3) 125 MCG (5000 UT) TABS Take 2 tablets by mouth daily. Patient not taking: Reported on 07/29/2021    [provider]  docusate sodium (COLACE) 100 MG capsule Take 1 capsule (100 mg total) by mouth 2 (two) times daily. Patient not taking: Reported on 07/29/2021 10/30/20   [name], MD  FEROSUL 325 (65 Fe) MG tablet TAKE 1 TABLET BY MOUTH EVERY OTHER DAY Patient not taking: Reported on 07/29/2021 02/18/21   [name], MD  losartan (COZAAR) 100 MG tablet Take 100 mg by mouth daily. Patient not taking: Reported on 07/29/2021    [provider]  metoprolol succinate (TOPROL-XL) 25 MG 24 hr tablet TAKE 1 TABLET(25 MG) BY MOUTH EVERY EVENING Patient not taking: Reported on 07/29/2021 04/03/21   [name], MD  mirtazapine (REMERON) 7.5 MG tablet Take 7.5 mg by mouth at bedtime. Patient not taking: Reported on 07/29/2021    [provider]   niacin 500 MG tablet Take 500 mg by mouth at bedtime. Patient not taking: Reported on 04/25/2021    [provider]  pantoprazole (PROTONIX) 40 MG tablet TAKE 1 TABLET(40 MG) BY MOUTH DAILY Patient not taking: Reported on 07/29/2021 06/26/20   Ronnald Nian, DO  polyethylene glycol powder (GLYCOLAX/MIRALAX) 17 GM/SCOOP powder Take 17 g by mouth 2 (two) times daily as needed. Patient not taking: Reported on 07/29/2021 10/30/20   [name], MD  QUEtiapine (SEROQUEL) 25 MG tablet Take 25 mg by mouth at bedtime. Patient not taking: Reported on 07/29/2021    [provider]  Vitamin D, Ergocalciferol, (DRISDOL) 1.25 MG (50000 UNIT) CAPS capsule Take 1 capsule (50,000 Units total) by mouth every 7 (seven) days. Patient not taking: Reported on 07/29/2021 10/30/20   [name], MD  zinc gluconate 50 MG tablet Take 50 mg by mouth daily. Patient not taking: Reported on 04/25/2021    [provider]  Physical Exam: Vitals:   07/29/21 1115 07/29/21 1215 07/29/21 1345 07/29/21 1531  BP: (!) 149/97 (!) 149/95 (!) 164/98 (!) 172/94  Pulse: 94 92 (!) 103 (!) 108  Resp: '20 15 13 19  '$ Temp:    98 F (36.7 C)  TempSrc:    Oral  SpO2: 96% 94% 94% 98%  Weight:      Height:       Physical Exam Vitals and nursing note reviewed.  Constitutional:      General: He is awake.     Appearance: He is well-developed and normal weight. He is ill-appearing.  HENT:     Head: Normocephalic.     Mouth/Throat:     Mouth: Mucous membranes are moist.  Eyes:     Pupils: Pupils are equal, round, and reactive to light.  Neck:     Vascular: No JVD.  Cardiovascular:     Rate and Rhythm: Normal rate and regular rhythm.     Heart sounds: S1 normal and S2 normal.  Pulmonary:     Effort: Pulmonary effort is normal.     Breath sounds: Normal breath sounds.  Abdominal:     General: Bowel sounds are normal. There is distension.     Palpations: Abdomen is soft.      Tenderness: There is generalized abdominal tenderness. There is no guarding or rebound.     Hernia: A hernia is present. Hernia is present in the umbilical area and ventral area.  Musculoskeletal:     Cervical back: Neck supple.     Right lower leg: No edema.     Left lower leg: No edema.  Skin:    General: Skin is warm and dry.  Neurological:     General: No focal deficit present.     Mental Status: He is alert and oriented to person, place, and time.  Psychiatric:        Mood and Affect: Mood normal.        Behavior: Behavior normal.    Data Reviewed:  There are no new results to review at this time.  Assessment and Plan: Principal Problem:   SBO (small bowel obstruction) ([name]) Admit to telemetry/inpatient. Keep NPO. Continue NGT suctioning. Analgesics as needed. Antiemetics as needed. Keep electrolytes optimized. Pantoprazole IV daily. Follow CBC and chemistry. Follow-up abdominal x-ray in AM. Consult general surgery as needed.  Active Problems:   Type 2 diabetes mellitus without complication,    without long-term current use of insulin ([name]) Currently NPO. CBG monitoring with [facility].    Chronic atrial fibrillation   BRADYCARDIA-TACHYCARDIA SYNDROME History of pacemaker placement. Continue metoprolol 2.5 mg IVP every 6 hours. Holding Xarelto at the moment. Consult pharmacy for Lovenox dosing if SBO not resolved tomorrow.    Microcytic anemia Monitor hematocrit and hemoglobin.    Chronic diastolic heart failure ([name]) No signs of decompensation.    Stage 3b chronic kidney disease ([place]) Monitor renal function electrolytes.    Hypophosphatemia Replacing parenterally. Follow level as needed.    Advance Care Planning:   Code Status: Full Code   Consults:   Family Communication:   Severity of Illness: The appropriate patient status for this patient is OBSERVATION. Observation status is judged to be reasonable and necessary in order to provide the  required intensity of service to ensure the patient's safety. The patient's presenting symptoms, physical exam findings, and initial radiographic and laboratory data in the context of their medical condition is felt to place them at decreased risk  for further clinical deterioration. Furthermore, it is anticipated that the patient will be medically stable for discharge from the hospital within 2 midnights of admission.   Author: Reubin Milan, MD 07/29/2021 6:14 PM  For on call review [email or web address].   This document was prepared using Dragon voice recognition software and may contain some unintended transcription errors.

## 2021-07-29 NOTE — ED Notes (Signed)
Pt is unable to provide urine sample at this time. 

## 2021-07-29 NOTE — Plan of Care (Signed)
New admission 1507, SBO.  ?NGT inplace, surgery following.  ?IVF and pain management per orders.  ? ?Problem: Education: ?Goal: Knowledge of General Education information will improve ?Description: Including pain rating scale, medication(s)/side effects and non-pharmacologic comfort measures ?Outcome: Progressing ?  ?Problem: Health Behavior/Discharge Planning: ?Goal: Ability to manage health-related needs will improve ?Outcome: Progressing ?  ?Problem: Clinical Measurements: ?Goal: Ability to maintain clinical measurements within normal limits will improve ?Outcome: Progressing ?Goal: Will remain free from infection ?Outcome: Progressing ?Goal: Diagnostic test results will improve ?Outcome: Progressing ?Goal: Respiratory complications will improve ?Outcome: Progressing ?Goal: Cardiovascular complication will be avoided ?Outcome: Progressing ?  ?Problem: Activity: ?Goal: Risk for activity intolerance will decrease ?Outcome: Progressing ?  ?Problem: Nutrition: ?Goal: Adequate nutrition will be maintained ?Outcome: Progressing ?  ?Problem: Coping: ?Goal: Level of anxiety will decrease ?Outcome: Progressing ?  ?Problem: Elimination: ?Goal: Will not experience complications related to bowel motility ?Outcome: Progressing ?Goal: Will not experience complications related to urinary retention ?Outcome: Progressing ?  ?Problem: Pain Managment: ?Goal: General experience of comfort will improve ?Outcome: Progressing ?  ?Problem: Safety: ?Goal: Ability to remain free from injury will improve ?Outcome: Progressing ?  ?Problem: Skin Integrity: ?Goal: Risk for impaired skin integrity will decrease ?Outcome: Progressing ?  ?

## 2021-07-29 NOTE — ED Provider Notes (Signed)
[facility] DEPT Provider Note   CSN: [id number] Arrival date & time: 07/29/21  4098     History  Chief Complaint  Patient presents with   Abdominal Pain   Constipation    [name] is a 88 y.o. male.  HPI     88 year old male comes in with chief complaint of abdominal pain and constipation.  Patient has history of A-fib for which she is on Xarelto, PAD, CKD, stroke, diabetes and known history of ventral hernias.  Patient indicates that he started having constipation about 4 days ago.  He is not having BM, but passing flatus.  He is also having generalized abdominal pain.  Today the pain was worse and he started having some nausea and emesis, prompting him to come to the ER.  Patient's granddaughter is at the bedside.  She indicates that patient had similar symptoms in the past that required NG tube.  They make it clear that they would not prefer surgical intervention.  Home Medications Prior to Admission medications   Medication Sig Start Date End Date Taking? Authorizing Provider  atorvastatin (LIPITOR) 10 MG tablet Take 10 mg by mouth daily. 05/09/20   [provider]  Cholecalciferol (VITAMIN D-3) 125 MCG (5000 UT) TABS Take by mouth.    [provider]  docusate sodium (COLACE) 100 MG capsule Take 1 capsule (100 mg total) by mouth 2 (two) times daily. 10/30/20   [name], MD  FEROSUL 325 (65 Fe) MG tablet TAKE 1 TABLET BY MOUTH EVERY OTHER DAY 02/18/21   [name], MD  gabapentin (NEURONTIN) 100 MG capsule Take 1 capsule (100 mg total) by mouth at bedtime. 02/20/21   Lucretia Kern, DO  latanoprost (XALATAN) 0.005 % ophthalmic solution Place 1 drop into both eyes nightly. 05/03/20   [provider]  losartan (COZAAR) 50 MG tablet Take 1 tablet (50 mg total) by mouth daily. 05/17/20 04/25/21  Dutch Quint B, FNP  metoprolol succinate (TOPROL-XL) 25 MG 24 hr tablet TAKE 1 TABLET(25 MG) BY  MOUTH EVERY EVENING 04/03/21   [name], MD  niacin 500 MG tablet Take 500 mg by mouth at bedtime. Patient not taking: Reported on 04/25/2021    [provider]  Nutritional Supplements ([place]) Take by mouth.    [provider]  pantoprazole (PROTONIX) 40 MG tablet TAKE 1 TABLET(40 MG) BY MOUTH DAILY 06/26/20   Cirigliano, Mary K, DO  polyethylene glycol powder (GLYCOLAX/MIRALAX) 17 GM/SCOOP powder Take 17 g by mouth 2 (two) times daily as needed. 10/30/20   [name], MD  Vitamin D, Ergocalciferol, (DRISDOL) 1.25 MG (50000 UNIT) CAPS capsule Take 1 capsule (50,000 Units total) by mouth every 7 (seven) days. 10/30/20   [name], MD  XARELTO 15 MG TABS tablet TAKE 1 TABLET(15 MG) BY MOUTH DAILY WITH SUPPER 04/03/21   [name], MD  zinc gluconate 50 MG tablet Take 50 mg by mouth daily. Patient not taking: Reported on 04/25/2021    [provider]      Allergies    Patient has no known allergies.    Review of Systems   Review of Systems  All other systems reviewed and are negative.  Physical Exam Updated Vital Signs BP (!) 149/95    Pulse 92    Temp 98 F (36.7 C) (Oral)    Resp 15    Ht '5\' 11"'$  (1.803 m)    Wt 68  kg    SpO2 94%    BMI 20.91 kg/m  Physical Exam Vitals and nursing note reviewed.  Constitutional:      Appearance: He is well-developed.  HENT:     Head: Atraumatic.  Cardiovascular:     Rate and Rhythm: Rhythm irregular.  Pulmonary:     Effort: Pulmonary effort is normal.  Abdominal:     Palpations: Abdomen is soft.     Tenderness: There is generalized abdominal tenderness. There is no guarding or rebound.     Hernia: A hernia is present. Hernia is present in the ventral area.  Musculoskeletal:     Cervical back: Neck supple.  Skin:    General: Skin is warm.  Neurological:     Mental Status: He is alert and oriented to person, place, and time.    ED Results /  Procedures / Treatments   Labs (all labs ordered are listed, but only abnormal results are displayed) Labs Reviewed  COMPREHENSIVE METABOLIC PANEL - Abnormal; Notable for the following components:      Result Value   Glucose, Bld 189 (*)    BUN 24 (*)    Creatinine, Ser 1.70 (*)    GFR, Estimated 37 (*)    All other components within normal limits  CBC WITH DIFFERENTIAL/PLATELET - Abnormal; Notable for the following components:   WBC 12.3 (*)    Hemoglobin 12.8 (*)    HCT 38.6 (*)    MCV 77.8 (*)    MCH 25.8 (*)    RDW 16.9 (*)    Neutro Abs 10.7 (*)    All other components within normal limits  RESP PANEL BY RT-PCR (FLU A&B, COVID) ARPGX2  LACTIC ACID, PLASMA  LACTIC ACID, PLASMA  PROTIME-INR  APTT  LIPASE, BLOOD  URINALYSIS, ROUTINE W REFLEX MICROSCOPIC  MAGNESIUM  PHOSPHORUS    EKG EKG Interpretation  Date/Time:  Sunday July 29 2021 09:51:30 EDT Ventricular Rate:  103 PR Interval:    QRS Duration: 98 QT Interval:  364 QTC Calculation: 477 R Axis:   -30 Text Interpretation: Atrial fibrillation Left axis deviation Anterior infarct, old Repol abnrm suggests ischemia, lateral leads No acute changes No significant change since last tracing Confirmed by Varney Biles [phone]) on 07/29/2021 11:40:21 AM  Radiology CT ABDOMEN PELVIS WO CONTRAST  Result Date: 07/29/2021 CLINICAL DATA:  Severe nausea and vomiting. Suspected bowel obstruction. EXAM: CT ABDOMEN AND PELVIS WITHOUT CONTRAST TECHNIQUE: Multidetector CT imaging of the abdomen and pelvis was performed following the standard protocol without IV contrast. RADIATION DOSE REDUCTION: This exam was performed according to the departmental dose-optimization program which includes automated exposure control, adjustment of the mA and/or kV according to patient size and/or use of iterative reconstruction technique. COMPARISON:  12/01/2017 FINDINGS: Lower chest: Stable cardiomegaly, with pacer leads in the right heart. New small  pericardial effusion. Hepatobiliary: No mass visualized on this unenhanced exam. Prior cholecystectomy. No evidence of biliary obstruction. Pancreas: Scattered pancreatic calcifications are seen, consistent with chronic pancreatitis. A new 1.3 x 1.1 cm calculus is seen in the pancreatic body with pancreatic ductal dilatation noted in the tail. No masses are identified on this unenhanced exam. There is no evidence of acute peripancreatic inflammatory changes or fluid collections. Spleen:  Within normal limits in size. Adrenals/Urinary tract: Chronic right renal parenchymal scarring and multiple small low-attenuation cysts are again seen. No evidence of urolithiasis or hydronephrosis. Unremarkable unopacified urinary bladder. Stomach/Bowel: No evidence of obstruction, inflammatory process, or abnormal fluid collections. Vascular/Lymphatic: No pathologically  enlarged lymph nodes identified. Stable 2 cm benign-appearing right retrocrural cyst, unchanged compared to previous studies dating back to 2018. No evidence of abdominal aortic aneurysm. Aortic atherosclerotic calcification noted. Reproductive: Moderate size hiatal hernia is now distended with fluid. Multiple markedly dilated fluid-filled small bowel loops are also seen within the abdomen and pelvis. Transition point is seen at the site of a right sided paraumbilical ventral hernia which contains a loop of small bowel, causing a high-grade small-bowel obstruction. No evidence of bowel wall thickening or pneumatosis. Cephalad to this, there are 2 right paramedian epigastric ventral hernias both containing bowel, however these are not sites of obstruction. No evidence of inflammatory process or abnormal fluid collections. Other:  None. Musculoskeletal:  No suspicious bone lesions identified. IMPRESSION: High-grade distal small bowel obstruction due to right paraumbilical ventral hernia, which contains a loop of small bowel. No evidence of bowel ischemia. Two  additional right epigastric ventral hernias cephalad to this also contain bowel, however these are not sites of obstruction. Moderate hiatal hernia. Chronic pancreatitis, with new 1.3 cm calculus in the body causing ductal dilatation in the tail. No signs of acute pancreatitis or mass. New small pericardial effusion. Electronically Signed   By: Marlaine Hind M.D.   On: 07/29/2021 10:35   DG Chest Port 1 View  Result Date: 07/29/2021 CLINICAL DATA:  Questionable sepsis EXAM: PORTABLE CHEST 1 VIEW COMPARISON:  07/23/2015 FINDINGS: Dual-chamber pacer leads from the left in stable position. Cardiopericardial enlargement accentuated by low volumes. Chronic congested appearance of central vessels. There is no edema, consolidation, effusion, or pneumothorax. IMPRESSION: Cardiopericardial enlargement and low lung volumes. No acute finding. Electronically Signed   By: Jorje Guild M.D.   On: 07/29/2021 10:22    Procedures Hernia reduction  Date/Time: 07/29/2021 1:22 PM Performed by: Varney Biles, MD Authorized by: Varney Biles, MD  Consent: Verbal consent obtained. Risks and benefits: risks, benefits and alternatives were discussed Consent given by: patient Patient identity confirmed: arm band Time out: Immediately prior to procedure a "time out" was called to verify the correct patient, procedure, equipment, support staff and site/side marked as required. Preparation: Patient was prepped and draped in the usual sterile fashion. Local anesthesia used: no  Anesthesia: Local anesthesia used: no  Sedation: Patient sedated: no  Patient tolerance: patient tolerated the procedure well with no immediate complications      Medications Ordered in ED Medications  fentaNYL (SUBLIMAZE) injection 50 mcg (50 mcg Intravenous Given 07/29/21 1205)  ondansetron (ZOFRAN) injection 4 mg (has no administration in time range)  lactated ringers infusion ( Intravenous New Bag/Given 07/29/21 1206)   acetaminophen (TYLENOL) tablet 650 mg (has no administration in time range)    Or  acetaminophen (TYLENOL) suppository 650 mg (has no administration in time range)  pantoprazole (PROTONIX) injection 40 mg (has no administration in time range)  ondansetron (ZOFRAN) injection 4 mg (4 mg Intravenous Given 07/29/21 1022)  fentaNYL (SUBLIMAZE) injection 50 mcg (50 mcg Intravenous Given 07/29/21 1022)  sodium chloride 0.9 % bolus 1,000 mL (1,000 mLs Intravenous Bolus 07/29/21 1022)    ED Course/ Medical Decision Making/ A&P Clinical Course as of 07/29/21 1325  Sun Jul 29, 2021  1147 Received call from Dr. Rosendo Gros, general surgery.  Discussed with him the CT finding along with multiple hernias, there appears to be reducible.  Dr. Rosendo Gros recommended that if the hernias are reducible, then no NG tube is needed.   I went and reexamined the patient.  I reduced all the hernias (  3 total), but based on exam, there does not appear to be a clear foci.   Dr. Rosendo Gros was reconsulted -we also discussed the finding of chronic pancreatitis and distal stone at that time.  He recommends that we put NG tube only if patient is having nausea and vomiting.  The hospitalist can consult medicine if needed.  Given the LFTs being normal, pancreatitis being chronic in nature, unlikely it is an acute issue.  Lipase is pending at this time.   He again reiterated that hospitalist can consult surgery if needed.  After this conversation, patient had another episode of nausea and vomiting.  Will place NG tube. We will admit to the hospital. [AN]  1320 Dr. Olevia Bowens admitting.  Discussed with him the conversation with general surgeon and regarding to the CT finding.  Also discussed with him possibly looking into ordering HIDA scan.  He is aware that lipase is pending at this time. [AN]  [place] Reviewed.  Evidence of distal stone and chronic pancreatitis along with small bowel obstruction. [AN]  1324 DG  Chest Port 1 View I have visualized and interpreted the x-ray independently.  No evidence of free air. [AN]    Clinical Course User Index [AN] Varney Biles, MD                           Medical Decision Making Amount and/or Complexity of Data Reviewed Labs: ordered. Radiology: ordered. ECG/medicine tests: ordered.  Risk Prescription drug management. Decision regarding hospitalization.    This patient presents to the ED with chief complaint(s) of abdominal pain, nausea, constipation with pertinent past medical history of A-fib on Xarelto, SBO previously, diabetes and ventral hernias which further complicates the presenting complaint. The complaint involves an extensive differential diagnosis and treatment options and also carries with it a high risk of complications and morbidity.    The differential diagnosis includes : Metabolic issues like DKA, hyponatremia, uremia. Mechanical issue such as ileus, small bowel obstruction, hernia incarceration. Infectious concerns like intra-abdominal abscess, UTI.  The initial plan is to order basic labs and to get CT scan without contrast and reassess. IV fentanyl, IV Zofran ordered.  IV fluids also ordered.   Additional history obtained: Additional history obtained from family Records reviewed previous admission documents   - Consulted or discussed management/test interpretation w/ external professional: Dr. Rosendo Gros, general surgery pertaining to the CT findings.    Final Clinical Impression(s) / ED Diagnoses Final diagnoses:  SBO (small bowel obstruction) ([place])  Ventral hernia without obstruction or gangrene    Rx / DC Orders ED Discharge Orders     None         Varney Biles, MD 07/29/21 1325

## 2021-07-29 NOTE — ED Notes (Signed)
Pt aware urine sample needed. Urinal provided.

## 2021-07-30 ENCOUNTER — Ambulatory Visit: Payer: BC Managed Care – PPO | Admitting: Family Medicine

## 2021-07-30 ENCOUNTER — Inpatient Hospital Stay (HOSPITAL_COMMUNITY): Payer: Medicare PPO

## 2021-07-30 DIAGNOSIS — K56609 Unspecified intestinal obstruction, unspecified as to partial versus complete obstruction: Secondary | ICD-10-CM | POA: Diagnosis not present

## 2021-07-30 DIAGNOSIS — I482 Chronic atrial fibrillation, unspecified: Secondary | ICD-10-CM

## 2021-07-30 LAB — BASIC METABOLIC PANEL
Anion gap: 9 (ref 5–15)
BUN: 20 mg/dL (ref 8–23)
CO2: 22 mmol/L (ref 22–32)
Calcium: 8.2 mg/dL — ABNORMAL LOW (ref 8.9–10.3)
Chloride: 108 mmol/L (ref 98–111)
Creatinine, Ser: 1.35 mg/dL — ABNORMAL HIGH (ref 0.61–1.24)
GFR, Estimated: 48 mL/min — ABNORMAL LOW (ref >60)
Glucose, Bld: 120 mg/dL — ABNORMAL HIGH (ref 70–99)
Potassium: 4.8 mmol/L (ref 3.5–5.1)
Sodium: 139 mmol/L (ref 135–145)

## 2021-07-30 LAB — GLUCOSE, CAPILLARY
Glucose-Capillary: 157 mg/dL — ABNORMAL HIGH (ref 70–99)
Glucose-Capillary: 145 mg/dL — ABNORMAL HIGH (ref 70–99)
Glucose-Capillary: 122 mg/dL — ABNORMAL HIGH (ref 70–99)

## 2021-07-30 LAB — PHOSPHORUS: Phosphorus: 3.2 mg/dL (ref 2.5–4.6)

## 2021-07-30 MED ORDER — DIATRIZOATE MEGLUMINE & SODIUM 66-10 % PO SOLN
90.0000 mL | Freq: Once | ORAL | Status: AC
  Administered 2021-07-30: 90 mL via NASOGASTRIC
  Filled 2021-07-30: qty 90

## 2021-07-30 MED ORDER — ENOXAPARIN SODIUM 30 MG/0.3ML IJ SOSY
30.0000 mg | PREFILLED_SYRINGE | INTRAMUSCULAR | Status: DC
  Administered 2021-07-30: 30 mg via SUBCUTANEOUS
  Filled 2021-07-30: qty 0.3

## 2021-07-30 MED ORDER — ENOXAPARIN SODIUM 40 MG/0.4ML IJ SOSY: 40.0000 mg | PREFILLED_SYRINGE | INTRAMUSCULAR | Status: DC

## 2021-07-30 MED ORDER — LACTATED RINGERS IV SOLN: INTRAVENOUS | Status: DC

## 2021-07-30 NOTE — Hospital Course (Addendum)
89 yom with medical history significant of osteoarthritis, chronic atrial fibrillation on Xarelto, stage III CKD, hiatal hernia, ventral hernia x3, hypertension, pacemaker placement, tachycardia-bradycardia syndrome, type 2 diabetes, history of previous SBO brought to the ED w/ c/o generalized abdominal pain, abdominal distention, nausea/emesis x 1 day and  constipation x 4 days In ED vitals- no fever,pulse 106, respiration 21, BP 131/96 mmHg and O2 sat 98% on room air. labs His urinalysis showed ketonuria 5 mg/dL, but was otherwise normal.  CBC with a white count 12.3, hemoglobin 12.8 g/dL and platelets 171.  PT, INR and PTT unremarkable.  Lactic acid was normal.  CMP with a glucose of 189, BUN 24 and creatinine 1.70 mg/dL.  The rest of the CMP was normal.  Phosphorus 2.3 mg/dL CT abdomen/pelvis showed high-grade distal SBO due to right [place] umbilical ventral hernia, which contains a loop of small bowel.  No evidence of bowel ischemia.  There were 2 additional right epigastric ventral hernias.  All the hernias were reduced easily by Dr. [name].  NGT was placed On IV fluids and admitted for further management General surgery was consulted by ED.

## 2021-07-30 NOTE — Progress Notes (Signed)
PROGRESS NOTE [name]  ZOX:[id number] DOB: 19-Sep-1923 DOA: 07/29/2021 PCP: [name], MD   Brief Narrative/Hospital Course: 39 yom with medical history significant of osteoarthritis, chronic atrial fibrillation on Xarelto, stage III CKD, hiatal hernia, ventral hernia x3, hypertension, pacemaker placement, tachycardia-bradycardia syndrome, type 2 diabetes, history of previous SBO brought to the ED w/ c/o generalized abdominal pain, abdominal distention, nausea/emesis x 1 day and  constipation x 4 days In ED vitals- no fever,pulse 106, respiration 21, BP 131/96 mmHg and O2 sat 98% on room air. labs His urinalysis showed ketonuria 5 mg/dL, but was otherwise normal.  CBC with a white count 12.3, hemoglobin 12.8 g/dL and platelets 171.  PT, INR and PTT unremarkable.  Lactic acid was normal.  CMP with a glucose of 189, BUN 24 and creatinine 1.70 mg/dL.  The rest of the CMP was normal.  Phosphorus 2.3 mg/dL CT abdomen/pelvis showed high-grade distal SBO due to right Pott umbilical ventral hernia, which contains a loop of small bowel.  No evidence of bowel ischemia.  There were 2 additional right epigastric ventral hernias.  All the hernias were reduced easily by Dr. [name].  NGT was placed On IV fluids and admitted for further management General surgery was consulted by ED.    Subjective: Seen and examined this morning, on the bedside chair NG tube in place, reports feeling hungry, passing flatus.  No abdominal pain.  Assessment and Plan:  SBO Previous SBO Ventral hernia x3: Clinically feeling better passing flatus, x-ray this morning shows dilated small bowel loops. Consulted general surgery, keep on gentle IV fluid hydration, n.p.o. and await further surgical input.  Type 2DM without complication, without long-term current use of insulin: Blood sugar well controlled, continue SSI Recent Labs  Lab 07/29/21 1838 07/29/21 2323 07/30/21 0505  GLUCAP 157* 145* 122*     Chronic  atrial fibrillation s/p PM Tachybradycardia syndrome: Monitoring in telemetry.  Xarelto remains on hold continue Lovenox dosing prophylactic.  Continue metoprolol 2.5 every 6 hours while n.p.o.  Microcytic anemia: Hb stable.  Monitor. Recent Labs  Lab 07/29/21 0943  HGB 12.8*  HCT 38.6*    Chronic diastolic heart failure -needing IV fluid hydration for bowel obstruction for now, compensated, keep on gentle fluids monitor weight intake output Stage 3b chronic kidney disease: Renal function at baseline.  Monitor Recent Labs  Lab 07/29/21 0943 07/30/21 0743  BUN 24* 20  CREATININE 1.70* 1.35*    S/P longer: Sinus rales on gynecology floor: replete and recheck Recent Labs  Lab 07/29/21 0943 07/29/21 1141 07/30/21 0743  K 4.6  --  4.8  CALCIUM 9.2  --  8.2*  MG  --  2.8*  --   PHOS  --  2.3* 3.2    DVT prophylaxis: enoxaparin (LOVENOX) injection 30 mg Start: 07/30/21 1000 SCDs Start: 07/29/21 1246 Code Status:   Code Status: Full Code Family Communication: plan of care discussed with patient at bedside.  Disposition: Currently not medically stable for discharge. Status is: Inpatient Remains inpatient appropriate because: For ongoing management of small bowel obstruction Objective: Vitals last 24 hrs: Vitals:   07/29/21 1345 07/29/21 1531 07/29/21 2321 07/30/21 0301  BP: (!) 164/98 (!) 172/94 (!) 155/84 129/79  Pulse: (!) 103 (!) 108 91 95  Resp: '13 19 20 16  '$ Temp:  98 F (36.7 C) 98.3 F (36.8 C) 98.4 F (36.9 C)  TempSrc:  Oral Oral Oral  SpO2: 94% 98% 93% 95%  Weight:  Height:       Weight change:   Physical Examination: General exam: AA,older than stated age, weak appearing. HEENT:Oral mucosa moist, Ear/Nose WNL grossly, dentition normal. Respiratory system: bilaterally diminished BS, no use of accessory muscle Cardiovascular system: S1 & S2 +, No JVD,. Gastrointestinal system: Abdomen soft,NT,ND, BS+ Nervous System:Alert, awake, moving extremities and  grossly nonfocal Extremities: LE edema none,distal peripheral pulses palpable.  Skin: No rashes,no icterus. MSK: Normal muscle bulk,tone, power  Medications reviewed:  Scheduled Meds:  enoxaparin (LOVENOX) injection  30 mg Subcutaneous Q24H   mouth rinse  15 mL Mouth Rinse BID   metoprolol tartrate  2.5 mg Intravenous Q6H   pantoprazole (PROTONIX) IV  40 mg Intravenous Daily   Continuous Infusions:  lactated ringers 100 mL/hr at 07/30/21 0739      Diet Order             Diet NPO time specified  Diet effective now                   Intake/Output Summary (Last 24 hours) at 07/30/2021 0941 Last data filed at 07/30/2021 0744 Gross per 24 hour  Intake 736.19 ml  Output 1450 ml  Net -713.81 ml   Net IO Since Admission: -713.81 mL [07/30/21 0941]  Wt Readings from Last 3 Encounters:  07/29/21 68 kg  04/25/21 68 kg  10/30/20 65.4 kg     Unresulted Labs (From admission, onward)     Start     Ordered   07/31/21 0500  Magnesium  Tomorrow morning,   R       Question:  Specimen collection method  Answer:  Lab=Lab collect   07/30/21 0732   07/31/21 4196  Basic metabolic panel  Daily,   R     Question:  Specimen collection method  Answer:  Lab=Lab collect   07/30/21 0732   07/31/21 0500  CBC  Daily,   R     Question:  Specimen collection method  Answer:  Lab=Lab collect   07/30/21 0732          Data Reviewed: I have personally reviewed following labs and imaging studies CBC: Recent Labs  Lab 07/29/21 0943  WBC 12.3*  NEUTROABS 10.7*  HGB 12.8*  HCT 38.6*  MCV 77.8*  PLT 222   Basic Metabolic Panel: Recent Labs  Lab 07/29/21 0943 07/29/21 1141 07/30/21 0743  NA 138  --  139  K 4.6  --  4.8  CL 101  --  108  CO2 28  --  22  GLUCOSE 189*  --  120*  BUN 24*  --  20  CREATININE 1.70*  --  1.35*  CALCIUM 9.2  --  8.2*  MG  --  2.8*  --   PHOS  --  2.3* 3.2   GFR: Estimated Creatinine Clearance: 31.5 mL/min (A) (by C-G formula based on SCr of 1.35  mg/dL (H)). Liver Function Tests: Recent Labs  Lab 07/29/21 0943  AST 27  ALT 12  ALKPHOS 95  BILITOT 0.7  PROT 8.0  ALBUMIN 4.5   Recent Labs  Lab 07/29/21 1141  LIPASE 33   No results for input(s): AMMONIA in the last 168 hours. Coagulation Profile: Recent Labs  Lab 07/29/21 0943  INR 1.1   Cardiac Enzymes: No results for input(s): CKTOTAL, CKMB, CKMBINDEX, TROPONINI in the last 168 hours. BNP (last 3 results) No results for input(s): PROBNP in the last 8760 hours. HbA1C: No results for input(s): HGBA1C in  the last 72 hours. CBG: Recent Labs  Lab 07/29/21 1838 07/29/21 2323 07/30/21 0505  GLUCAP 157* 145* 122*   Lipid Profile: No results for input(s): CHOL, HDL, LDLCALC, TRIG, CHOLHDL, LDLDIRECT in the last 72 hours. Thyroid Function Tests: No results for input(s): TSH, T4TOTAL, FREET4, T3FREE, THYROIDAB in the last 72 hours. Anemia Panel: No results for input(s): VITAMINB12, FOLATE, FERRITIN, TIBC, IRON, RETICCTPCT in the last 72 hours. Sepsis Labs: Recent Labs  Lab 07/29/21 0943 07/29/21 1143  LATICACIDVEN 1.7 1.5    Recent Results (from the past 240 hour(s))  Resp Panel by RT-PCR (Flu A&B, Covid) Nasopharyngeal Swab     Status: None   Collection Time: 07/29/21 11:38 AM   Specimen: Nasopharyngeal Swab; Nasopharyngeal(NP) swabs in vial transport medium  Result Value Ref Range Status   SARS Coronavirus 2 by RT PCR NEGATIVE NEGATIVE Final    Comment: (NOTE) SARS-CoV-2 target nucleic acids are NOT DETECTED.  The SARS-CoV-2 RNA is generally detectable in upper respiratory specimens during the acute phase of infection. The lowest concentration of SARS-CoV-2 viral copies this assay can detect is 138 copies/mL. A negative result does not preclude SARS-Cov-2 infection and should not be used as the sole basis for treatment or other patient management decisions. A negative result may occur with  improper specimen collection/handling, submission of specimen  other than nasopharyngeal swab, presence of viral mutation(s) within the areas targeted by this assay, and inadequate number of viral copies(<138 copies/mL). A negative result must be combined with clinical observations, patient history, and epidemiological information. The expected result is Negative.  Fact Sheet for Patients:  EntrepreneurPulse.com.au  Fact Sheet for Healthcare Providers:  IncredibleEmployment.be  This test is no t yet approved or cleared by the [place] FDA and  has been authorized for detection and/or diagnosis of SARS-CoV-2 by FDA under an Emergency Use Authorization (EUA). This EUA will remain  in effect (meaning this test can be used) for the duration of the COVID-19 declaration under Section 564(b)(1) of the Act, 21 U.S.C.section 360bbb-3(b)(1), unless the authorization is terminated  or revoked sooner.       Influenza A by PCR NEGATIVE NEGATIVE Final   Influenza B by PCR NEGATIVE NEGATIVE Final    Comment: (NOTE) The Xpert Xpress SARS-CoV-2/FLU/RSV plus assay is intended as an aid in the diagnosis of influenza from Nasopharyngeal swab specimens and should not be used as a sole basis for treatment. Nasal washings and aspirates are unacceptable for Xpert Xpress SARS-CoV-2/FLU/RSV testing.  Fact Sheet for Patients: EntrepreneurPulse.com.au  Fact Sheet for Healthcare Providers: IncredibleEmployment.be  This test is not yet approved or cleared by the [place] FDA and has been authorized for detection and/or diagnosis of SARS-CoV-2 by FDA under an Emergency Use Authorization (EUA). This EUA will remain in effect (meaning this test can be used) for the duration of the COVID-19 declaration under Section 564(b)(1) of the Act, 21 U.S.C. section 360bbb-3(b)(1), unless the authorization is terminated or revoked.  Performed at [facility], [place] [address], [place], [place] [address]     Antimicrobials: Anti-infectives (From admission, onward)    None      Culture/Microbiology    Component Value Date/Time   SDES URINE, CLEAN CATCH 04/06/2010 1103   SPECREQUEST NONE 04/06/2010 1103   CULT NO GROWTH 04/06/2010 1103   REPTSTATUS 04/08/2010 FINAL 04/06/2010 1103    Other culture-see note  Radiology Studies: CT ABDOMEN PELVIS WO CONTRAST  Result Date: 07/29/2021 CLINICAL DATA:  Severe nausea and vomiting.  Suspected bowel obstruction. EXAM: CT ABDOMEN AND PELVIS WITHOUT CONTRAST TECHNIQUE: Multidetector CT imaging of the abdomen and pelvis was performed following the standard protocol without IV contrast. RADIATION DOSE REDUCTION: This exam was performed according to the departmental dose-optimization program which includes automated exposure control, adjustment of the mA and/or kV according to patient size and/or use of iterative reconstruction technique. COMPARISON:  12/01/2017 FINDINGS: Lower chest: Stable cardiomegaly, with pacer leads in the right heart. New small pericardial effusion. Hepatobiliary: No mass visualized on this unenhanced exam. Prior cholecystectomy. No evidence of biliary obstruction. Pancreas: Scattered pancreatic calcifications are seen, consistent with chronic pancreatitis. A new 1.3 x 1.1 cm calculus is seen in the pancreatic body with pancreatic ductal dilatation noted in the tail. No masses are identified on this unenhanced exam. There is no evidence of acute peripancreatic inflammatory changes or fluid collections. Spleen:  Within normal limits in size. Adrenals/Urinary tract: Chronic right renal parenchymal scarring and multiple small low-attenuation cysts are again seen. No evidence of urolithiasis or hydronephrosis. Unremarkable unopacified urinary bladder. Stomach/Bowel: No evidence of obstruction, inflammatory process, or abnormal fluid collections. Vascular/Lymphatic: No pathologically enlarged lymph nodes identified.  Stable 2 cm benign-appearing right retrocrural cyst, unchanged compared to previous studies dating back to 2018. No evidence of abdominal aortic aneurysm. Aortic atherosclerotic calcification noted. Reproductive: Moderate size hiatal hernia is now distended with fluid. Multiple markedly dilated fluid-filled small bowel loops are also seen within the abdomen and pelvis. Transition point is seen at the site of a right sided paraumbilical ventral hernia which contains a loop of small bowel, causing a high-grade small-bowel obstruction. No evidence of bowel wall thickening or pneumatosis. Cephalad to this, there are 2 right paramedian epigastric ventral hernias both containing bowel, however these are not sites of obstruction. No evidence of inflammatory process or abnormal fluid collections. Other:  None. Musculoskeletal:  No suspicious bone lesions identified. IMPRESSION: High-grade distal small bowel obstruction due to right paraumbilical ventral hernia, which contains a loop of small bowel. No evidence of bowel ischemia. Two additional right epigastric ventral hernias cephalad to this also contain bowel, however these are not sites of obstruction. Moderate hiatal hernia. Chronic pancreatitis, with new 1.3 cm calculus in the body causing ductal dilatation in the tail. No signs of acute pancreatitis or mass. New small pericardial effusion. Electronically Signed   By: Marlaine Hind M.D.   On: 07/29/2021 10:35   DG Abd 1 View  Result Date: 07/30/2021 CLINICAL DATA:  NG tube placement EXAM: ABDOMEN - 1 VIEW COMPARISON:  07/29/2021 FINDINGS: NG tube tip is in the proximal stomach. Dilated small bowel loops noted. IMPRESSION: NG tube tip in the proximal stomach, similar to prior study. Electronically Signed   By: Rolm Baptise M.D.   On: 07/30/2021 01:39   DG Abd 1 View  Result Date: 07/29/2021 CLINICAL DATA:  NG tube advanced EXAM: ABDOMEN - 1 VIEW COMPARISON:  07/29/2021 FINDINGS: NG tube was advanced. The tip is  in the stomach. Dilated small bowel loops compatible with small bowel obstruction. IMPRESSION: NG tube advanced into the stomach. Electronically Signed   By: Rolm Baptise M.D.   On: 07/29/2021 19:15   DG Abdomen 1 View  Result Date: 07/29/2021 CLINICAL DATA:  NG tube placement.  Hiatal hernia. EXAM: ABDOMEN - 1 VIEW COMPARISON:  01/14/2017 FINDINGS: An NG tube is noted with tip appearing to lie within the patient's large hiatal hernia. Distended loops of small bowel within the abdomen are again noted. IMPRESSION: NG tube with tip  appearing to lie within the patient's large hiatal hernia. Electronically Signed   By: Margarette Canada M.D.   On: 07/29/2021 15:08   DG Chest Port 1 View  Result Date: 07/29/2021 CLINICAL DATA:  Questionable sepsis EXAM: PORTABLE CHEST 1 VIEW COMPARISON:  07/23/2015 FINDINGS: Dual-chamber pacer leads from the left in stable position. Cardiopericardial enlargement accentuated by low volumes. Chronic congested appearance of central vessels. There is no edema, consolidation, effusion, or pneumothorax. IMPRESSION: Cardiopericardial enlargement and low lung volumes. No acute finding. Electronically Signed   By: Jorje Guild M.D.   On: 07/29/2021 10:22     LOS: 1 day   Antonieta Pert, MD [facility]  07/30/2021, 9:41 AM

## 2021-07-30 NOTE — Progress Notes (Addendum)
Received call from patients granddaughter, patient called her with increased confusion and agitation. She said he normally take seroquel every night at bedtime and takes either ativan or xanax as needed. Bed alarm on bed in lowest position. I will consult on-call NP.  ?

## 2021-07-30 NOTE — Consult Note (Addendum)
[name] Nov 08, 1923  [id number].    Requesting MD: Dr. Antonieta Pert Chief Complaint/Reason for Consult: SBO, ventral hernia   HPI: [name] is a 88 y.o. male with a hx A. Fib on Xarelto (last dose 3/12 am), tachy-brady syndrome s/p pacemaker, Dm2, CKD3, HTN who presented with abdominal pain, n/v, and constipation. Patient started feeling constipated about 4-5 days prior to presentation, with inability to have a bm, although was still passing flatus. Noted generalized, crampy abdominal pain that worsened yesterday which prompted him to be evaluated in ED. Patient's granddaughter was in the ED with him and patient and granddaughter made it clear they would prefer not to have surgical intervention. Patient found to have multiple ventral hernias, all of which were reduced easily by ED provider. NGT was placed and patient reported feeling better once NGT was placed to suction. Today patient reports he has no abdominal pain or nausea. He continues to pass flatus. No BM since arrival. He was unable to recall his surgical history but per chart review has a hx of open cholecystectomy, hiatal hernia repair and ex lap w/ sbr & incisional hernia repair per chart review.  NKDA. He reports he lives at home alone and does not use an assistive device for ambulation.   ROS:  Review of Systems  Constitutional:  Negative for chills and fever.  Respiratory:  Negative for shortness of breath and wheezing.   Cardiovascular:  Negative for chest pain and palpitations.  Gastrointestinal:  Positive for abdominal pain, constipation, nausea and vomiting. Negative for diarrhea.  Genitourinary:  Negative for dysuria, frequency and urgency.  All other systems reviewed and are negative.  Family History  Problem Relation Age of Onset   Pneumonia Father    Stroke Brother    Stroke Sister    Cancer Brother        type unknown    Past Medical History:  Diagnosis Date   Arthritis    Atrial fibrillation  ([place])    CKD (chronic kidney disease)    History of hiatal hernia    Hx SBO    Last 2013 all treated conservatively   Hypertension    Presence of permanent cardiac pacemaker    Prostate cancer ([place])    Stroke ([place]) 2017   left facial drooping noted 08/14/2016   Tachycardia-bradycardia syndrome ([place])    Type II diabetes mellitus ([place])    Ventral hernia    x3    Past Surgical History:  Procedure Laterality Date   ABDOMINAL AORTOGRAM W/LOWER EXTREMITY N/A 08/07/2016   Procedure: Abdominal Aortogram w/Lower Extremity;  Surgeon: Wellington Hampshire, MD;  Location: [place] [facility];  Service: Cardiovascular;  Laterality: N/A;   CHOLECYSTECTOMY OPEN  03/31/2001   Dr Lindon Romp   COLON SURGERY     EXPLORATORY LAPAROTOMY  08/2004   Archie Endo 10/01/2010   HEMORRHOID SURGERY     HERNIA REPAIR     HIATAL HERNIA REPAIR  2002   INCISIONAL HERNIA REPAIR  08/2004   Archie Endo 10/01/2010   INSERT / REPLACE / REMOVE PACEMAKER     PERIPHERAL VASCULAR BALLOON ANGIOPLASTY  08/07/2016   Procedure: Peripheral Vascular Balloon Angioplasty;  Surgeon: Wellington Hampshire, MD;  Location: [place] [facility];  Service: Cardiovascular;;  left popliteal artery   PERIPHERAL VASCULAR INTERVENTION  08/14/2016   popliteal artery/notes 08/14/2016   PERIPHERAL VASCULAR INTERVENTION Left 08/14/2016   Procedure: Peripheral Vascular Intervention;  Surgeon: Wellington Hampshire, MD;  Location: [place]  CV LAB;  Service: Cardiovascular;  Laterality: Left;  popliteal artery   PERMANENT PACEMAKER GENERATOR CHANGE N/A 04/06/2012   Procedure: PERMANENT PACEMAKER GENERATOR CHANGE;  Surgeon: Evans Lance, MD;  Location[facility] CATH LAB;  Service: Cardiovascular;  Laterality: N/A;   SMALL INTESTINE SURGERY  09/16/2004   SBO resection, VH repair    Social History:  reports that he quit smoking about 66 years ago. His smoking use included cigarettes. He has a 22.50 pack-year smoking history. He quit smokeless tobacco use about 68 years ago.  His  smokeless tobacco use included chew. He reports that he does not drink alcohol and does not use drugs.  Allergies: No Known Allergies  Medications Prior to Admission  Medication Sig Dispense Refill   latanoprost (XALATAN) 0.005 % ophthalmic solution Place 1 drop into both eyes nightly.     Nutritional Supplements (GLUCERNA ADVANCE SHAKE PO) Take by mouth.     XARELTO 15 MG TABS tablet TAKE 1 TABLET(15 MG) BY MOUTH DAILY WITH SUPPER (Patient taking differently: 15 mg daily with supper.) 30 tablet 3   atorvastatin (LIPITOR) 10 MG tablet Take 10 mg by mouth daily. (Patient not taking: Reported on 07/29/2021)     Cholecalciferol (VITAMIN D-3) 125 MCG (5000 UT) TABS Take 2 tablets by mouth daily. (Patient not taking: Reported on 07/29/2021)     docusate sodium (COLACE) 100 MG capsule Take 1 capsule (100 mg total) by mouth 2 (two) times daily. (Patient not taking: Reported on 07/29/2021) 10 capsule 0   FEROSUL 325 (65 Fe) MG tablet TAKE 1 TABLET BY MOUTH EVERY OTHER DAY (Patient not taking: Reported on 07/29/2021) 90 tablet 2   losartan (COZAAR) 100 MG tablet Take 100 mg by mouth daily. (Patient not taking: Reported on 07/29/2021)     metoprolol succinate (TOPROL-XL) 25 MG 24 hr tablet TAKE 1 TABLET(25 MG) BY MOUTH EVERY EVENING (Patient not taking: Reported on 07/29/2021) 90 tablet 1   mirtazapine (REMERON) 7.5 MG tablet Take 7.5 mg by mouth at bedtime. (Patient not taking: Reported on 07/29/2021)     niacin 500 MG tablet Take 500 mg by mouth at bedtime. (Patient not taking: Reported on 04/25/2021)     pantoprazole (PROTONIX) 40 MG tablet TAKE 1 TABLET(40 MG) BY MOUTH DAILY (Patient not taking: Reported on 07/29/2021) 90 tablet 1   polyethylene glycol powder (GLYCOLAX/MIRALAX) 17 GM/SCOOP powder Take 17 g by mouth 2 (two) times daily as needed. (Patient not taking: Reported on 07/29/2021) 3350 g 1   QUEtiapine (SEROQUEL) 25 MG tablet Take 25 mg by mouth at bedtime. (Patient not taking: Reported on 07/29/2021)      Vitamin D, Ergocalciferol, (DRISDOL) 1.25 MG (50000 UNIT) CAPS capsule Take 1 capsule (50,000 Units total) by mouth every 7 (seven) days. (Patient not taking: Reported on 07/29/2021) 15 capsule 1   zinc gluconate 50 MG tablet Take 50 mg by mouth daily. (Patient not taking: Reported on 04/25/2021)       Physical Exam: Blood pressure 129/79, pulse 95, temperature 98.4 F (36.9 C), temperature source Oral, resp. rate 16, height '5\' 11"'$  (1.803 m), weight 68 kg, SpO2 95 %. General: pleasant, WD/WN  elderly male who is sitting up in chair in NAD HEENT: head is normocephalic, atraumatic.  Sclera are noninjected.  PERRL.  Ears and nose without any masses or lesions.  Mouth is pink and moist. Dentition fair Heart: regular rate with irregular rhythm. No obvious murmur. Pacemaker in left upper chest palpable below skin. Palpable radial pulses bilaterally  Lungs: CTAB, no wheezes, rhonchi, or rales noted.  Respiratory effort nonlabored Abd: Soft, mild distension, NT, +BS. Patient with 2 upper right ventral hernia's that are easily reducible with palpable wide mouth fascial defect. The hernia's do spontaneously recur. Inferior to this, there is also a right ventral hernia that is easily reducible with a smaller palpable defect. This hernia also spontaneously recurs. There are no masses or organomegaly palpated. NGT in place on LIWS w/ small amount of bilious output in cannister  MS: no BUE/BLE edema, calves soft and nontender Skin: warm and dry with no masses, lesions, or rashes Psych: A&Ox3 with an appropriate affect Neuro: cranial nerves grossly intact, normal speech, thought process intact, moves all extremities, gait not assessed  Results for orders placed or performed during the hospital encounter of 07/29/21 (from the past 48 hour(s))  Lactic acid, plasma     Status: None   Collection Time: 07/29/21  9:43 AM  Result Value Ref Range   Lactic Acid, Venous 1.7 0.5 - 1.9 mmol/L    Comment: Performed at  [facility], [place] [address], [place], [place] [address]  Comprehensive metabolic panel     Status: Abnormal   Collection Time: 07/29/21  9:43 AM  Result Value Ref Range   Sodium 138 135 - 145 mmol/L   Potassium 4.6 3.5 - 5.1 mmol/L   Chloride 101 98 - 111 mmol/L   CO2 28 22 - 32 mmol/L   Glucose, Bld 189 (H) 70 - 99 mg/dL    Comment: Glucose reference range applies only to samples taken after fasting for at least 8 hours.   BUN 24 (H) 8 - 23 mg/dL   Creatinine, Ser 1.70 (H) 0.61 - 1.24 mg/dL   Calcium 9.2 8.9 - 10.3 mg/dL   Total Protein 8.0 6.5 - 8.1 g/dL   Albumin 4.5 3.5 - 5.0 g/dL   AST 27 15 - 41 U/L   ALT 12 0 - 44 U/L   Alkaline Phosphatase 95 38 - 126 U/L   Total Bilirubin 0.7 0.3 - 1.2 mg/dL   GFR, Estimated 37 (L) >60 mL/min    Comment: (NOTE) Calculated using the CKD-EPI Creatinine Equation (2021)    Anion gap 9 5 - 15    Comment: Performed at [facility], [place] [address], [place], [place] [address]  CBC WITH DIFFERENTIAL     Status: Abnormal   Collection Time: 07/29/21  9:43 AM  Result Value Ref Range   WBC 12.3 (H) 4.0 - 10.5 K/uL   RBC 4.96 4.22 - 5.81 MIL/uL   Hemoglobin 12.8 (L) 13.0 - 17.0 g/dL   HCT 38.6 (L) 39.0 - 52.0 %   MCV 77.8 (L) 80.0 - 100.0 fL   MCH 25.8 (L) 26.0 - 34.0 pg   MCHC 33.2 30.0 - 36.0 g/dL   RDW 16.9 (H) 11.5 - 15.5 %   Platelets 171 150 - 400 K/uL   nRBC 0.0 0.0 - 0.2 %   Neutrophils Relative % 88 %   Neutro Abs 10.7 (H) 1.7 - 7.7 K/uL   Lymphocytes Relative 6 %   Lymphs Abs 0.7 0.7 - 4.0 K/uL   Monocytes Relative 6 %   Monocytes Absolute 0.8 0.1 - 1.0 K/uL   Eosinophils Relative 0 %   Eosinophils Absolute 0.0 0.0 - 0.5 K/uL   Basophils Relative 0 %   Basophils Absolute 0.0 0.0 - 0.1 K/uL   Immature Granulocytes 0 %   Abs Immature Granulocytes 0.03  0.00 - 0.07 K/uL    Comment: Performed at [facility], [place] [address] [place], [place] [address]  Protime-INR     Status:  None   Collection Time: 07/29/21  9:43 AM  Result Value Ref Range   Prothrombin Time 14.7 11.4 - 15.2 seconds   INR 1.1 0.8 - 1.2    Comment: (NOTE) INR goal varies based on device and disease states. Performed at [facility], [place] [address], [place], [place] [address]   APTT     Status: None   Collection Time: 07/29/21  9:43 AM  Result Value Ref Range   aPTT 33 24 - 36 seconds    Comment: Performed at [facility], [place] [address] [place], [place] [address]  Urinalysis, Routine w reflex microscopic Urine, Clean Catch     Status: Abnormal   Collection Time: 07/29/21  9:43 AM  Result Value Ref Range   Color, Urine YELLOW YELLOW   APPearance CLEAR CLEAR   Specific Gravity, Urine 1.012 1.005 - 1.030   pH 8.0 5.0 - 8.0   Glucose, UA NEGATIVE NEGATIVE mg/dL   Hgb urine dipstick NEGATIVE NEGATIVE   Bilirubin Urine NEGATIVE NEGATIVE   Ketones, ur 5 (A) NEGATIVE mg/dL   Protein, ur NEGATIVE NEGATIVE mg/dL   Nitrite NEGATIVE NEGATIVE   Leukocytes,Ua NEGATIVE NEGATIVE    Comment: Performed at [place] [address] [place], [place] [address]  Resp Panel by RT-PCR (Flu A&B, Covid) Nasopharyngeal Swab     Status: None   Collection Time: 07/29/21 11:38 AM   Specimen: Nasopharyngeal Swab; Nasopharyngeal(NP) swabs in vial transport medium  Result Value Ref Range   SARS Coronavirus 2 by RT PCR NEGATIVE NEGATIVE    Comment: (NOTE) SARS-CoV-2 target nucleic acids are NOT DETECTED.  The SARS-CoV-2 RNA is generally detectable in upper respiratory specimens during the acute phase of infection. The lowest concentration of SARS-CoV-2 viral copies this assay can detect is 138 copies/mL. A negative result does not preclude SARS-Cov-2 infection and should not be used as the sole basis for treatment or other patient management decisions. A negative result may occur with  improper specimen collection/handling, submission of specimen  other than nasopharyngeal swab, presence of viral mutation(s) within the areas targeted by this assay, and inadequate number of viral copies(<138 copies/mL). A negative result must be combined with clinical observations, patient history, and epidemiological information. The expected result is Negative.  Fact Sheet for Patients:  EntrepreneurPulse.com.au  Fact Sheet for Healthcare Providers:  IncredibleEmployment.be  This test is no t yet approved or cleared by the [place] FDA and  has been authorized for detection and/or diagnosis of SARS-CoV-2 by FDA under an Emergency Use Authorization (EUA). This EUA will remain  in effect (meaning this test can be used) for the duration of the COVID-19 declaration under Section 564(b)(1) of the Act, 21 U.S.C.section 360bbb-3(b)(1), unless the authorization is terminated  or revoked sooner.       Influenza A by PCR NEGATIVE NEGATIVE   Influenza B by PCR NEGATIVE NEGATIVE    Comment: (NOTE) The Xpert Xpress SARS-CoV-2/FLU/RSV plus assay is intended as an aid in the diagnosis of influenza from Nasopharyngeal swab specimens and should not be used as a sole basis for treatment. Nasal washings and aspirates are unacceptable for Xpert Xpress SARS-CoV-2/FLU/RSV testing.  Fact Sheet for Patients: EntrepreneurPulse.com.au  Fact Sheet for Healthcare Providers: IncredibleEmployment.be  This test is not yet approved or cleared by the [place]  States FDA and has been authorized for detection and/or diagnosis of SARS-CoV-2 by FDA under an Emergency Use Authorization (EUA). This EUA will remain in effect (meaning this test can be used) for the duration of the COVID-19 declaration under Section 564(b)(1) of the Act, 21 U.S.C. section 360bbb-3(b)(1), unless the authorization is terminated or revoked.  Performed at [facility], [place] [address] [place], [place] [address]   Lipase, blood     Status: None   Collection Time: 07/29/21 11:41 AM  Result Value Ref Range   Lipase 33 11 - 51 U/L    Comment: Performed at [facility], [place] [address], [place], [place] [address]  Magnesium     Status: Abnormal   Collection Time: 07/29/21 11:41 AM  Result Value Ref Range   Magnesium 2.8 (H) 1.7 - 2.4 mg/dL    Comment: Performed at [facility], [place] [address], [place], [place] [address]  Phosphorus     Status: Abnormal   Collection Time: 07/29/21 11:41 AM  Result Value Ref Range   Phosphorus 2.3 (L) 2.5 - 4.6 mg/dL    Comment: Performed at [facility], [place] [address] [place], [place] [address]  Lactic acid, plasma     Status: None   Collection Time: 07/29/21 11:43 AM  Result Value Ref Range   Lactic Acid, Venous 1.5 0.5 - 1.9 mmol/L    Comment: Performed at [facility], [place] [address] [place], [place] [address]  Glucose, capillary     Status: Abnormal   Collection Time: 07/29/21  6:38 PM  Result Value Ref Range   Glucose-Capillary 157 (H) 70 - 99 mg/dL    Comment: Glucose reference range applies only to samples taken after fasting for at least 8 hours.  Glucose, capillary     Status: Abnormal   Collection Time: 07/29/21 11:23 PM  Result Value Ref Range   Glucose-Capillary 145 (H) 70 - 99 mg/dL    Comment: Glucose reference range applies only to samples taken after fasting for at least 8 hours.  Glucose, capillary     Status: Abnormal   Collection Time: 07/30/21  5:05 AM  Result Value Ref Range   Glucose-Capillary 122 (H) 70 - 99 mg/dL    Comment: Glucose reference range applies only to samples taken after fasting for at least 8 hours.  Phosphorus     Status: None   Collection Time: 07/30/21  7:43 AM  Result Value Ref Range   Phosphorus 3.2 2.5 - 4.6 mg/dL    Comment: Performed at [facility], [place] [address], [place], [place]  [address]  Basic metabolic panel     Status: Abnormal   Collection Time: 07/30/21  7:43 AM  Result Value Ref Range   Sodium 139 135 - 145 mmol/L   Potassium 4.8 3.5 - 5.1 mmol/L   Chloride 108 98 - 111 mmol/L   CO2 22 22 - 32 mmol/L   Glucose, Bld 120 (H) 70 - 99 mg/dL    Comment: Glucose reference range applies only to samples taken after fasting for at least 8 hours.   BUN 20 8 - 23 mg/dL   Creatinine, Ser 1.35 (H) 0.61 - 1.24 mg/dL   Calcium 8.2 (L) 8.9 - 10.3 mg/dL   GFR, Estimated 48 (L) >60 mL/min    Comment: (NOTE) Calculated using the CKD-EPI Creatinine Equation (2021)    Anion gap 9 5 - 15    Comment: Performed  at [facility], [place] [address], [place], [place] [address]   CT ABDOMEN PELVIS WO CONTRAST  Result Date: 07/29/2021 CLINICAL DATA:  Severe nausea and vomiting. Suspected bowel obstruction. EXAM: CT ABDOMEN AND PELVIS WITHOUT CONTRAST TECHNIQUE: Multidetector CT imaging of the abdomen and pelvis was performed following the standard protocol without IV contrast. RADIATION DOSE REDUCTION: This exam was performed according to the departmental dose-optimization program which includes automated exposure control, adjustment of the mA and/or kV according to patient size and/or use of iterative reconstruction technique. COMPARISON:  12/01/2017 FINDINGS: Lower chest: Stable cardiomegaly, with pacer leads in the right heart. New small pericardial effusion. Hepatobiliary: No mass visualized on this unenhanced exam. Prior cholecystectomy. No evidence of biliary obstruction. Pancreas: Scattered pancreatic calcifications are seen, consistent with chronic pancreatitis. A new 1.3 x 1.1 cm calculus is seen in the pancreatic body with pancreatic ductal dilatation noted in the tail. No masses are identified on this unenhanced exam. There is no evidence of acute peripancreatic inflammatory changes or fluid collections. Spleen:  Within normal limits in size. Adrenals/Urinary tract:  Chronic right renal parenchymal scarring and multiple small low-attenuation cysts are again seen. No evidence of urolithiasis or hydronephrosis. Unremarkable unopacified urinary bladder. Stomach/Bowel: No evidence of obstruction, inflammatory process, or abnormal fluid collections. Vascular/Lymphatic: No pathologically enlarged lymph nodes identified. Stable 2 cm benign-appearing right retrocrural cyst, unchanged compared to previous studies dating back to 2018. No evidence of abdominal aortic aneurysm. Aortic atherosclerotic calcification noted. Reproductive: Moderate size hiatal hernia is now distended with fluid. Multiple markedly dilated fluid-filled small bowel loops are also seen within the abdomen and pelvis. Transition point is seen at the site of a right sided paraumbilical ventral hernia which contains a loop of small bowel, causing a high-grade small-bowel obstruction. No evidence of bowel wall thickening or pneumatosis. Cephalad to this, there are 2 right paramedian epigastric ventral hernias both containing bowel, however these are not sites of obstruction. No evidence of inflammatory process or abnormal fluid collections. Other:  None. Musculoskeletal:  No suspicious bone lesions identified. IMPRESSION: High-grade distal small bowel obstruction due to right paraumbilical ventral hernia, which contains a loop of small bowel. No evidence of bowel ischemia. Two additional right epigastric ventral hernias cephalad to this also contain bowel, however these are not sites of obstruction. Moderate hiatal hernia. Chronic pancreatitis, with new 1.3 cm calculus in the body causing ductal dilatation in the tail. No signs of acute pancreatitis or mass. New small pericardial effusion. Electronically Signed   By: Marlaine Hind M.D.   On: 07/29/2021 10:35   DG Abd 1 View  Result Date: 07/30/2021 CLINICAL DATA:  NG tube placement EXAM: ABDOMEN - 1 VIEW COMPARISON:  07/29/2021 FINDINGS: NG tube tip is in the proximal  stomach. Dilated small bowel loops noted. IMPRESSION: NG tube tip in the proximal stomach, similar to prior study. Electronically Signed   By: Rolm Baptise M.D.   On: 07/30/2021 01:39   DG Abd 1 View  Result Date: 07/29/2021 CLINICAL DATA:  NG tube advanced EXAM: ABDOMEN - 1 VIEW COMPARISON:  07/29/2021 FINDINGS: NG tube was advanced. The tip is in the stomach. Dilated small bowel loops compatible with small bowel obstruction. IMPRESSION: NG tube advanced into the stomach. Electronically Signed   By: Rolm Baptise M.D.   On: 07/29/2021 19:15   DG Abdomen 1 View  Result Date: 07/29/2021 CLINICAL DATA:  NG tube placement.  Hiatal hernia. EXAM: ABDOMEN - 1 VIEW COMPARISON:  01/14/2017 FINDINGS: An NG  tube is noted with tip appearing to lie within the patient's large hiatal hernia. Distended loops of small bowel within the abdomen are again noted. IMPRESSION: NG tube with tip appearing to lie within the patient's large hiatal hernia. Electronically Signed   By: Margarette Canada M.D.   On: 07/29/2021 15:08   DG Chest Port 1 View  Result Date: 07/29/2021 CLINICAL DATA:  Questionable sepsis EXAM: PORTABLE CHEST 1 VIEW COMPARISON:  07/23/2015 FINDINGS: Dual-chamber pacer leads from the left in stable position. Cardiopericardial enlargement accentuated by low volumes. Chronic congested appearance of central vessels. There is no edema, consolidation, effusion, or pneumothorax. IMPRESSION: Cardiopericardial enlargement and low lung volumes. No acute finding. Electronically Signed   By: Jorje Guild M.D.   On: 07/29/2021 10:22    Anti-infectives (From admission, onward)    None       Assessment/Plan Multiple ventral hernias, reducible SBO - Patient's hernia's are easily reducible but do recur. Patient would like to avoid surgery if at all possible. He has an NGT in place. Will order abdominal binder and start SBO protocol. Please hold Xarelto incase patient does not improve with conservative management.  Discussed with patient if he does not improve with conservative management, we would need to have a more detailed discussion about surgical intervention.   FEN - NPO, NGT to LIWS, LR @ 50 cc/h VTE - LMWH. Hold Xarelto. Okay for heparin gtt.  ID - no current abx Foley - external catheter present   - below per TRH -  hx A. Fib on Xarelto (last dose yesterday am) tachy-brady syndrome s/p pacemaker Dm2 CKD3 HTN Chronic diastolic CHF Microcytic anemia   I reviewed nursing notes, ED provider notes, hospitalist notes, last 24 h vitals and pain scores, last 48 h intake and output, last 24 h labs and trends, and last 24 h imaging results.  Jillyn Ledger, [facility] 07/30/2021, 9:14 AM Please see Amion for pager number during day hours 7:00am-4:30pm

## 2021-07-30 NOTE — Progress Notes (Addendum)
Patient is disconnecting his IV and removing tele. Reconnected both. Bed alarm on bed in lowest position. ?

## 2021-07-30 NOTE — TOC Initial Note (Signed)
Transition of Care (TOC) - Initial/Assessment Note  ? ? ?Patient Details  ?Name: [name] ?MRN: [id number] ?Date of Birth: [birth year] ? ?Transition of Care (TOC) CM/SW Contact:    ?[place], LCSW ?Phone Number: ?07/30/2021, 2:47 PM ? ?Clinical Narrative:                 ? ?Transition of Care Department [facility]) has reviewed patient and no TOC needs have been identified at this time. We will continue to monitor patient advancement through interdisciplinary progression rounds. If new patient transition needs arise, please place a TOC consult. ?  ?  ? ? ?Patient Goals and CMS Choice ?  ?  ?  ? ?Expected Discharge Plan and Services ?  ?  ?  ?  ?  ?                ?  ?  ?  ?  ?  ?  ?  ?  ?  ?  ? ?Prior Living Arrangements/Services ?  ?  ?  ?       ?  ?  ?  ?  ? ?Activities of Daily Living ?Home Assistive Devices/Equipment: Gilford Rile (specify type), Shower chair with back ?ADL Screening (condition at time of admission) ?Patient's cognitive ability adequate to safely complete daily activities?: Yes ?Is the patient deaf or have difficulty hearing?: Yes ?Does the patient have difficulty seeing, even when wearing glasses/contacts?: No ?Does the patient have difficulty concentrating, remembering, or making decisions?: Yes ?Patient able to express need for assistance with ADLs?: Yes ?Does the patient have difficulty dressing or bathing?: No ?Independently performs ADLs?: Yes (appropriate for developmental age) ?Does the patient have difficulty walking or climbing stairs?: Yes ?Weakness of Legs: Both ?Weakness of Arms/Hands: None ? ?Permission Sought/Granted ?  ?  ?   ?   ?   ?   ? ?Emotional Assessment ?  ?  ?  ?  ?  ?  ? ?Admission diagnosis:  Ventral hernia without obstruction or gangrene [K43.9] ?SBO (small bowel obstruction) ([place]) [K56.609] ?Patient Active Problem List  ? Diagnosis Date Noted  ? Chronic atrial fibrillation with RVR ([place]) 07/30/2021  ? Hypophosphatemia 07/29/2021  ? Stage 3b chronic kidney disease ([place])  04/25/2021  ? Continuous leakage of urine 04/25/2021  ? At high risk for injury related to fall 04/25/2021  ? Hyperkalemia 11/05/2020  ? Hypocalcemia 10/30/2020  ? Vitamin D deficiency 10/30/2020  ? Edema due to malnutrition ([place]) 10/30/2020  ? Unsteady gait when walking 09/21/2020  ? Sarcopenia 09/21/2020  ? Driving safety issue 09/21/2020  ? B12 deficiency 01/27/2020  ? Chronic diastolic heart failure ([place]) 03/09/2019  ? Pain in both feet 11/17/2018  ? Neuropathy 10/09/2018  ? Psychophysiological insomnia 10/09/2018  ? Slow transit constipation 06/18/2018  ? Syncope 12/03/2017  ? Dehydration   ? AKI (acute kidney injury) ([place]) 12/01/2017  ? Iron deficiency anemia 11/25/2017  ? Decreased appetite 10/16/2017  ? Hearing difficulty of both ears 10/16/2017  ? Allergic rhinitis due to pollen 09/03/2017  ? Ventricular tachycardia 01/14/2017  ? Malnutrition of moderate degree 01/13/2017  ? DOE (dyspnea on exertion) 01/12/2017  ? Small bowel obstruction ([place]) 01/12/2017  ? PAD (peripheral artery disease) ([place]) 08/14/2016  ? CVA (cerebral infarction) 07/23/2015  ? Microcytic anemia 07/23/2015  ? Stroke (cerebrum) ([place]) 07/23/2015  ? Chronic kidney disease 07/23/2015  ? Acute CVA (cerebrovascular accident) ([place])   ? 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 SBO (small bowel  obstruction) ([place]) 02/14/2012  ? Abdominal pain 02/14/2012  ? Nausea & vomiting 02/14/2012  ? PPM-Medtronic 03/13/2010  ? Type 2 diabetes mellitus without complication, without long-term current use of insulin ([place]) 11/14/2008  ? Atrial fibrillation ([place]) 11/14/2008  ? BRADYCARDIA-TACHYCARDIA SYNDROME 11/14/2008  ? ?PCP:  [name], MD ?Pharmacy:   ?[facility], [address] ?[place] [address] ?Phone: [phone] Fax: [phone] ? ? ? ? ?Social Determinants of Health (SDOH) Interventions ?  ? ?Readmission Risk Interventions ?No  flowsheet data found. ? ? ?

## 2021-07-30 NOTE — Progress Notes (Signed)
Mobility Specialist - Progress Note ? ? ? 07/30/21 1519  ?Mobility  ?Activity Ambulated with assistance in room;Stood at bedside  ?Level of Assistance Minimal assist, patient does 75% or more  ?Assistive Device Other (Comment) ?(IV Pole)  ?Distance Ambulated (ft) 20 ft  ?Activity Response Tolerated well  ?$Mobility charge 1 Mobility  ? ?Upon entry pt was agreeable to mobilize. Pt required Min A to sit and stand at EOB. Refused RW and preferred IV Pole, but required verbal cues to be more aware of safety and balance. Experienced 2 LOB episodes and required A to recover. Pt stated he is a bit "clumsy" at home. Prior to ambulating in room, pt practiced sit to stands x3. After 1 sitting rest break, pt ambulated 20 ft in room and was left sitting up in recliner. NGT reconnected and all pt necessities were in reach. NT and RN informed of session.  ? ?Kyan Yurkovich S. ?Mobility Specialist ?Acute Rehabilitation Services ?Phone: [phone] ?07/30/21, 3:24 PM ? ? ? ?

## 2021-07-30 NOTE — Progress Notes (Addendum)
Patient disconnecting IV, removed tele, trying to get out of the bed with NG tube still in place. Patient reoriented and reconnected both IV and tele. Bed alarm on bed in lowest position. ?

## 2021-07-30 NOTE — Progress Notes (Addendum)
Patient removed hand mittens, removed tele, and at the foot of the bed with NG tube still in place. No PRN to administer, will consult on-call NP for orders. Bed alarm on bed in lowest position. ?

## 2021-07-30 NOTE — Progress Notes (Addendum)
CBGs today 100 at 1120 and 85 at 1740.  ?Glucometers not properly syncing at this time.   ?

## 2021-07-30 NOTE — Plan of Care (Signed)
Pt aox4, forgetful, pleasant and cooperative with staff.  ?NGT in place to LIS.  ?IVF per orders. NPO status.  ?Surgery service following.  ? ?Problem: Education: ?Goal: Knowledge of General Education information will improve ?Description: Including pain rating scale, medication(s)/side effects and non-pharmacologic comfort measures ?Outcome: Progressing ?  ?Problem: Health Behavior/Discharge Planning: ?Goal: Ability to manage health-related needs will improve ?Outcome: Progressing ?  ?Problem: Clinical Measurements: ?Goal: Ability to maintain clinical measurements within normal limits will improve ?Outcome: Progressing ?Goal: Will remain free from infection ?Outcome: Progressing ?Goal: Diagnostic test results will improve ?Outcome: Progressing ?Goal: Respiratory complications will improve ?Outcome: Progressing ?Goal: Cardiovascular complication will be avoided ?Outcome: Progressing ?  ?Problem: Activity: ?Goal: Risk for activity intolerance will decrease ?Outcome: Progressing ?  ?Problem: Nutrition: ?Goal: Adequate nutrition will be maintained ?Outcome: Progressing ?  ?Problem: Coping: ?Goal: Level of anxiety will decrease ?Outcome: Progressing ?  ?Problem: Elimination: ?Goal: Will not experience complications related to bowel motility ?Outcome: Progressing ?Goal: Will not experience complications related to urinary retention ?Outcome: Progressing ?  ?Problem: Pain Managment: ?Goal: General experience of comfort will improve ?Outcome: Progressing ?  ?Problem: Safety: ?Goal: Ability to remain free from injury will improve ?Outcome: Progressing ?  ?Problem: Skin Integrity: ?Goal: Risk for impaired skin integrity will decrease ?Outcome: Progressing ?   ?

## 2021-07-31 ENCOUNTER — Other Ambulatory Visit: Payer: Self-pay | Admitting: Family Medicine

## 2021-07-31 ENCOUNTER — Other Ambulatory Visit: Payer: Self-pay | Admitting: Cardiovascular Disease

## 2021-07-31 DIAGNOSIS — K5901 Slow transit constipation: Secondary | ICD-10-CM

## 2021-07-31 DIAGNOSIS — K56609 Unspecified intestinal obstruction, unspecified as to partial versus complete obstruction: Secondary | ICD-10-CM | POA: Diagnosis not present

## 2021-07-31 LAB — GLUCOSE, CAPILLARY
Glucose-Capillary: 100 mg/dL — ABNORMAL HIGH (ref 70–99)
Glucose-Capillary: 118 mg/dL — ABNORMAL HIGH (ref 70–99)
Glucose-Capillary: 232 mg/dL — ABNORMAL HIGH (ref 70–99)
Glucose-Capillary: 85 mg/dL (ref 70–99)
Glucose-Capillary: 88 mg/dL (ref 70–99)

## 2021-07-31 LAB — CBC
HCT: 36.4 % — ABNORMAL LOW (ref 39.0–52.0)
Hemoglobin: 11.2 g/dL — ABNORMAL LOW (ref 13.0–17.0)
MCH: 25.1 pg — ABNORMAL LOW (ref 26.0–34.0)
MCHC: 30.8 g/dL (ref 30.0–36.0)
MCV: 81.6 fL (ref 80.0–100.0)
Platelets: 141 10*3/uL — ABNORMAL LOW (ref 150–400)
RBC: 4.46 MIL/uL (ref 4.22–5.81)
RDW: 17.1 % — ABNORMAL HIGH (ref 11.5–15.5)
WBC: 12.2 10*3/uL — ABNORMAL HIGH (ref 4.0–10.5)
nRBC: 0 % (ref 0.0–0.2)

## 2021-07-31 LAB — BASIC METABOLIC PANEL
Anion gap: 14 (ref 5–15)
BUN: 25 mg/dL — ABNORMAL HIGH (ref 8–23)
CO2: 20 mmol/L — ABNORMAL LOW (ref 22–32)
Calcium: 8.4 mg/dL — ABNORMAL LOW (ref 8.9–10.3)
Chloride: 106 mmol/L (ref 98–111)
Creatinine, Ser: 1.62 mg/dL — ABNORMAL HIGH (ref 0.61–1.24)
GFR, Estimated: 39 mL/min — ABNORMAL LOW (ref >60)
Glucose, Bld: 98 mg/dL (ref 70–99)
Potassium: 4.9 mmol/L (ref 3.5–5.1)
Sodium: 140 mmol/L (ref 135–145)

## 2021-07-31 LAB — MAGNESIUM: Magnesium: 2.6 mg/dL — ABNORMAL HIGH (ref 1.7–2.4)

## 2021-07-31 MED ORDER — ENOXAPARIN SODIUM 30 MG/0.3ML IJ SOSY
30.0000 mg | PREFILLED_SYRINGE | INTRAMUSCULAR | Status: DC
  Administered 2021-07-31: 30 mg via SUBCUTANEOUS
  Filled 2021-07-31: qty 0.3

## 2021-07-31 MED ORDER — INSULIN ASPART 100 UNIT/ML IJ SOLN
0.0000 [IU] | Freq: Three times a day (TID) | INTRAMUSCULAR | Status: DC
  Administered 2021-07-31: 2 [IU] via SUBCUTANEOUS
  Administered 2021-08-01: 1 [IU] via SUBCUTANEOUS
  Administered 2021-08-01: 3 [IU] via SUBCUTANEOUS

## 2021-07-31 MED ORDER — LACTATED RINGERS IV SOLN: INTRAVENOUS | Status: AC

## 2021-07-31 MED ORDER — LORAZEPAM 2 MG/ML IJ SOLN
0.5000 mg | Freq: Once | INTRAMUSCULAR | Status: AC
  Administered 2021-07-31: 0.5 mg via INTRAVENOUS
  Filled 2021-07-31: qty 1

## 2021-07-31 NOTE — Progress Notes (Signed)
? ? ?   ?Subjective: ?CC: ?Per notes, patient was confused overnight and removed ngt. He is now awake, alert, appropriate and orientated x 3. He denies any abdominal pain, nausea or vomiting. Continues to pass flatus. No BM. Mobilized 74f w/ IV pole and tech yesterday.  ? ?Objective: ?Vital signs in last 24 hours: ?Temp:  [97.6 ?F (36.4 ?C)-98.3 ?F (36.8 ?C)] 98.3 ?F (36.8 ?C) (03/14 00277 ?Pulse Rate:  [58-109] 103 (03/14 0339) ?Resp:  [16-18] 18 (03/14 0339) ?BP: (132-147)/(63-94) 132/82 (03/14 0339) ?SpO2:  [95 %-96 %] 96 % (03/14 0339) ?Last BM Date : 07/24/21 ? ?Intake/Output from previous day: ?03/13 0701 - 03/14 0700 ?In: 696.6 [I.V.:696.6] ?Out: 14128[Urine:900; Emesis/NG output:375] ?Intake/Output this shift: ?No intake/output data recorded. ? ?PE: ?Gen:  Alert, NAD, pleasant ?Pulm:  Rate and effort normal ?Abd: Soft, mild but less distended compared to yesterday, NT, +BS. Patient with 2 upper right ventral hernia's that are easily reducible with palpable defect. Inferior to this, there is also a right ventral hernia that is easily reducible with a smaller palpable defect.  ?Psych: A&Ox3  ? ? ?Lab Results:  ?Recent Labs  ?  07/29/21 ?0786703/14/23 ?0612  ?WBC 12.3* 12.2*  ?HGB 12.8* 11.2*  ?HCT 38.6* 36.4*  ?PLT 171 141*  ? ?BMET ?Recent Labs  ?  07/30/21 ?0743 07/31/21 ?06720 ?NA 139 140  ?K 4.8 4.9  ?CL 108 106  ?CO2 22 20*  ?GLUCOSE 120* 98  ?BUN 20 25*  ?CREATININE 1.35* 1.62*  ?CALCIUM 8.2* 8.4*  ? ?PT/INR ?Recent Labs  ?  07/29/21 ?0943  ?LABPROT 14.7  ?INR 1.1  ? ?CMP  ?   ?Component Value Date/Time  ? NA 140 07/31/2021 0504  ? NA 141 11/08/2016 1100  ? K 4.9 07/31/2021 0504  ? CL 106 07/31/2021 0504  ? CO2 20 (L) 07/31/2021 0504  ? GLUCOSE 98 07/31/2021 0504  ? BUN 25 (H) 07/31/2021 0504  ? BUN 19 11/08/2016 1100  ? CREATININE 1.62 (H) 07/31/2021 0504  ? CREATININE 1.93 (H) 11/17/2018 1429  ? CALCIUM 8.4 (L) 07/31/2021 0504  ? PROT 8.0 07/29/2021 0943  ? ALBUMIN 4.5 07/29/2021 0943  ? AST 27  07/29/2021 0943  ? ALT 12 07/29/2021 0943  ? ALKPHOS 95 07/29/2021 0943  ? BILITOT 0.7 07/29/2021 0943  ? GFRNONAA 39 (L) 07/31/2021 0504  ? GFRAA 39 (L) 12/05/2017 0752  ? ?Lipase  ?   ?Component Value Date/Time  ? LIPASE 33 07/29/2021 1141  ? ? ?Studies/Results: ?CT ABDOMEN PELVIS WO CONTRAST ? ?Result Date: 07/29/2021 ?CLINICAL DATA:  Severe nausea and vomiting. Suspected bowel obstruction. EXAM: CT ABDOMEN AND PELVIS WITHOUT CONTRAST TECHNIQUE: Multidetector CT imaging of the abdomen and pelvis was performed following the standard protocol without IV contrast. RADIATION DOSE REDUCTION: This exam was performed according to the departmental dose-optimization program which includes automated exposure control, adjustment of the mA and/or kV according to patient size and/or use of iterative reconstruction technique. COMPARISON:  12/01/2017 FINDINGS: Lower chest: Stable cardiomegaly, with pacer leads in the right heart. New small pericardial effusion. Hepatobiliary: No mass visualized on this unenhanced exam. Prior cholecystectomy. No evidence of biliary obstruction. Pancreas: Scattered pancreatic calcifications are seen, consistent with chronic pancreatitis. A new 1.3 x 1.1 cm calculus is seen in the pancreatic body with pancreatic ductal dilatation noted in the tail. No masses are identified on this unenhanced exam. There is no evidence of acute peripancreatic inflammatory changes or fluid collections. Spleen:  Within normal limits  in size. Adrenals/Urinary tract: Chronic right renal parenchymal scarring and multiple small low-attenuation cysts are again seen. No evidence of urolithiasis or hydronephrosis. Unremarkable unopacified urinary bladder. Stomach/Bowel: No evidence of obstruction, inflammatory process, or abnormal fluid collections. Vascular/Lymphatic: No pathologically enlarged lymph nodes identified. Stable 2 cm benign-appearing right retrocrural cyst, unchanged compared to previous studies dating back to  2018. No evidence of abdominal aortic aneurysm. Aortic atherosclerotic calcification noted. Reproductive: Moderate size hiatal hernia is now distended with fluid. Multiple markedly dilated fluid-filled small bowel loops are also seen within the abdomen and pelvis. Transition point is seen at the site of a right sided paraumbilical ventral hernia which contains a loop of small bowel, causing a high-grade small-bowel obstruction. No evidence of bowel wall thickening or pneumatosis. Cephalad to this, there are 2 right paramedian epigastric ventral hernias both containing bowel, however these are not sites of obstruction. No evidence of inflammatory process or abnormal fluid collections. Other:  None. Musculoskeletal:  No suspicious bone lesions identified. IMPRESSION: High-grade distal small bowel obstruction due to right paraumbilical ventral hernia, which contains a loop of small bowel. No evidence of bowel ischemia. Two additional right epigastric ventral hernias cephalad to this also contain bowel, however these are not sites of obstruction. Moderate hiatal hernia. Chronic pancreatitis, with new 1.3 cm calculus in the body causing ductal dilatation in the tail. No signs of acute pancreatitis or mass. New small pericardial effusion. Electronically Signed   By: Marlaine Hind M.D.   On: 07/29/2021 10:35  ? ?DG Abd 1 View ? ?Result Date: 07/30/2021 ?CLINICAL DATA:  NG tube placement EXAM: ABDOMEN - 1 VIEW COMPARISON:  07/29/2021 FINDINGS: NG tube tip is in the proximal stomach. Dilated small bowel loops noted. IMPRESSION: NG tube tip in the proximal stomach, similar to prior study. Electronically Signed   By: Rolm Baptise M.D.   On: 07/30/2021 01:39  ? ?DG Abd 1 View ? ?Result Date: 07/29/2021 ?CLINICAL DATA:  NG tube advanced EXAM: ABDOMEN - 1 VIEW COMPARISON:  07/29/2021 FINDINGS: NG tube was advanced. The tip is in the stomach. Dilated small bowel loops compatible with small bowel obstruction. IMPRESSION: NG tube  advanced into the stomach. Electronically Signed   By: Rolm Baptise M.D.   On: 07/29/2021 19:15  ? ?DG Abdomen 1 View ? ?Result Date: 07/29/2021 ?CLINICAL DATA:  NG tube placement.  Hiatal hernia. EXAM: ABDOMEN - 1 VIEW COMPARISON:  01/14/2017 FINDINGS: An NG tube is noted with tip appearing to lie within the patient's large hiatal hernia. Distended loops of small bowel within the abdomen are again noted. IMPRESSION: NG tube with tip appearing to lie within the patient's large hiatal hernia. Electronically Signed   By: Margarette Canada M.D.   On: 07/29/2021 15:08  ? ?DG Chest Port 1 View ? ?Result Date: 07/29/2021 ?CLINICAL DATA:  Questionable sepsis EXAM: PORTABLE CHEST 1 VIEW COMPARISON:  07/23/2015 FINDINGS: Dual-chamber pacer leads from the left in stable position. Cardiopericardial enlargement accentuated by low volumes. Chronic congested appearance of central vessels. There is no edema, consolidation, effusion, or pneumothorax. IMPRESSION: Cardiopericardial enlargement and low lung volumes. No acute finding. Electronically Signed   By: Jorje Guild M.D.   On: 07/29/2021 10:22  ? ?DG Abd Portable 1V-Small Bowel Obstruction Protocol-initial, 8 hr delay ? ?Result Date: 07/30/2021 ?CLINICAL DATA:  Small bowel protocol.  8 hours postcontrast. EXAM: PORTABLE ABDOMEN - 1 VIEW COMPARISON:  07/30/2021 FINDINGS: Contrast material is demonstrated throughout the colon. Presence of contrast material in the colon suggest  no evidence of high-grade small bowel obstruction. No significant gaseous distention of small bowel. An enteric tube is present with tip in the left upper quadrant consistent with location in the stomach. IMPRESSION: Contrast material is demonstrated throughout the colon suggesting no evidence of high-grade small bowel obstruction. Electronically Signed   By: Lucienne Capers M.D.   On: 07/30/2021 20:18   ? ?Anti-infectives: ?Anti-infectives (From admission, onward)  ? ? None  ? ?   ? ? ? ?Assessment/Plan ?Multiple ventral hernias, reducible ?SBO ?- Patient currently asymptomatic without abdominal pain, nausea or vomiting. He is passing flatus. Hernia's are soft and reducible. His abdomen is completely NT. Xray this am

## 2021-07-31 NOTE — Progress Notes (Signed)
NG Tube removed by patient overnight. Not replaced r/t agitation/confusion. ?No additional distention, no nausea, vomiting, or abdominal pain. Abdominal binder remains in place.  ?XR from 8pm available for review.  ?Legrand Como, Surgical PAC, notified. Okay to leave tube out at this time. He will be up to evaluate patient.  ?

## 2021-07-31 NOTE — Progress Notes (Signed)
Pt with blood in urine. No trauma noted to penis. Pt denies any abdominal pain or pain with urination.  ?MD notified. No new orders at this time.  ?Will continue to monitor.  ?

## 2021-07-31 NOTE — Plan of Care (Signed)
Pt aox4, periods of confusion and impulsiveness.  ?Sitter at bedside, pulling at tubes and lines.  ?IVF per orders.  ?NGT removed by patient. ABD binder in place.  ?Surgery following, advanced to clear liquids. No additional pain/nausea/vomiting. ? ? ?Problem: Education: ?Goal: Knowledge of General Education information will improve ?Description: Including pain rating scale, medication(s)/side effects and non-pharmacologic comfort measures ?Outcome: Progressing ?  ?Problem: Health Behavior/Discharge Planning: ?Goal: Ability to manage health-related needs will improve ?Outcome: Progressing ?  ?Problem: Clinical Measurements: ?Goal: Ability to maintain clinical measurements within normal limits will improve ?Outcome: Progressing ?Goal: Will remain free from infection ?Outcome: Progressing ?Goal: Diagnostic test results will improve ?Outcome: Progressing ?Goal: Respiratory complications will improve ?Outcome: Progressing ?Goal: Cardiovascular complication will be avoided ?Outcome: Progressing ?  ?Problem: Activity: ?Goal: Risk for activity intolerance will decrease ?Outcome: Progressing ?  ?Problem: Nutrition: ?Goal: Adequate nutrition will be maintained ?Outcome: Progressing ?  ?Problem: Coping: ?Goal: Level of anxiety will decrease ?Outcome: Progressing ?  ?Problem: Elimination: ?Goal: Will not experience complications related to bowel motility ?Outcome: Progressing ?Goal: Will not experience complications related to urinary retention ?Outcome: Progressing ?  ?Problem: Pain Managment: ?Goal: General experience of comfort will improve ?Outcome: Progressing ?  ?Problem: Safety: ?Goal: Ability to remain free from injury will improve ?Outcome: Progressing ?  ?Problem: Skin Integrity: ?Goal: Risk for impaired skin integrity will decrease ?Outcome: Progressing ?  ?

## 2021-07-31 NOTE — Progress Notes (Addendum)
Patient removed NG tube, confused, and try to get out of bed. Request for safety sitter is pending. Charge nurse assisted me with the patient and I was told to leave the NG tube out . The patient is confused and said he did not want nothing. Bed in lowest position bed alarm is on.  ?

## 2021-07-31 NOTE — Progress Notes (Signed)
CBG 118 

## 2021-07-31 NOTE — TOC Initial Note (Signed)
Transition of Care (TOC) - Initial/Assessment Note  ? ? ?Patient Details  ?Name: [name] ?MRN: [id number] ?Date of Birth: 05-16-1924 ? ?Transition of Care (TOC) CM/SW Contact:    ?[place], LCSW ?Phone Number: ?07/31/2021, 11:54 AM ? ?Clinical Narrative:                 ? ?Informed by RN that grandaughter(POA) is requesting to speak with CSW. CSW called and left message requesting return call.  ?  ?  ? ? ?Patient Goals and CMS Choice ?  ?  ?  ? ?Expected Discharge Plan and Services ?  ?  ?  ?  ?  ?                ?  ?  ?  ?  ?  ?  ?  ?  ?  ?  ? ?Prior Living Arrangements/Services ?  ?  ?  ?       ?  ?  ?  ?  ? ?Activities of Daily Living ?Home Assistive Devices/Equipment: Gilford Rile (specify type), Shower chair with back ?ADL Screening (condition at time of admission) ?Patient's cognitive ability adequate to safely complete daily activities?: Yes ?Is the patient deaf or have difficulty hearing?: Yes ?Does the patient have difficulty seeing, even when wearing glasses/contacts?: No ?Does the patient have difficulty concentrating, remembering, or making decisions?: Yes ?Patient able to express need for assistance with ADLs?: Yes ?Does the patient have difficulty dressing or bathing?: No ?Independently performs ADLs?: Yes (appropriate for developmental age) ?Does the patient have difficulty walking or climbing stairs?: Yes ?Weakness of Legs: Both ?Weakness of Arms/Hands: None ? ?Permission Sought/Granted ?  ?  ?   ?   ?   ?   ? ?Emotional Assessment ?  ?  ?  ?  ?  ?  ? ?Admission diagnosis:  Ventral hernia without obstruction or gangrene [K43.9] ?SBO (small bowel obstruction) ([place]) [K56.609] ?Patient Active Problem List  ? Diagnosis Date Noted  ? Chronic atrial fibrillation with RVR ([place]) 07/30/2021  ? Hypophosphatemia 07/29/2021  ? Stage 3b chronic kidney disease ([place]) 04/25/2021  ? Continuous leakage of urine 04/25/2021  ? At high risk for injury related to fall 04/25/2021  ? Hyperkalemia 11/05/2020  ?  Hypocalcemia 10/30/2020  ? Vitamin D deficiency 10/30/2020  ? Edema due to malnutrition ([place]) 10/30/2020  ? Unsteady gait when walking 09/21/2020  ? Sarcopenia 09/21/2020  ? Driving safety issue 09/21/2020  ? B12 deficiency 01/27/2020  ? Chronic diastolic heart failure ([place]) 03/09/2019  ? Pain in both feet 11/17/2018  ? Neuropathy 10/09/2018  ? Psychophysiological insomnia 10/09/2018  ? Slow transit constipation 06/18/2018  ? Syncope 12/03/2017  ? Dehydration   ? AKI (acute kidney injury) ([place]) 12/01/2017  ? Iron deficiency anemia 11/25/2017  ? Decreased appetite 10/16/2017  ? Hearing difficulty of both ears 10/16/2017  ? Allergic rhinitis due to pollen 09/03/2017  ? Ventricular tachycardia 01/14/2017  ? Malnutrition of moderate degree 01/13/2017  ? DOE (dyspnea on exertion) 01/12/2017  ? Small bowel obstruction ([place]) 01/12/2017  ? PAD (peripheral artery disease) ([place]) 08/14/2016  ? CVA (cerebral infarction) 07/23/2015  ? Microcytic anemia 07/23/2015  ? Stroke (cerebrum) ([place]) 07/23/2015  ? Chronic kidney disease 07/23/2015  ? Acute CVA (cerebrovascular accident) ([place])   ? 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 SBO (small bowel obstruction) ([place]) 02/14/2012  ? Abdominal pain 02/14/2012  ? Nausea & vomiting 02/14/2012  ? PPM-Medtronic 03/13/2010  ?  Type 2 diabetes mellitus without complication, without long-term current use of insulin ([place]) 11/14/2008  ? Atrial fibrillation ([place]) 11/14/2008  ? BRADYCARDIA-TACHYCARDIA SYNDROME 11/14/2008  ? ?PCP:  [name], MD ?Pharmacy:   ?[facility] [place], [address] ?[place] [address] ?Phone: [phone] Fax: [phone] ? ? ? ? ?Social Determinants of Health (SDOH) Interventions ?  ? ?Readmission Risk Interventions ?No flowsheet data found. ? ? ?

## 2021-07-31 NOTE — Progress Notes (Signed)
?PROGRESS NOTE ?[name]  QIO:[id number] DOB: 12/31/1923 DOA: 07/29/2021 ?PCP: [name], MD  ? ?Brief Narrative/Hospital Course: ?38 yom with medical history significant of osteoarthritis, chronic atrial fibrillation on Xarelto, stage III CKD, hiatal hernia, ventral hernia x3, hypertension, pacemaker placement, tachycardia-bradycardia syndrome, type 2 diabetes, history of previous SBO brought to the ED w/ c/o generalized abdominal pain, abdominal distention, nausea/emesis x 1 day and  constipation x 4 days ?In ED vitals- no fever,pulse 106, respiration 21, BP 131/96 mmHg and O2 sat 98% on room air. labs His urinalysis showed ketonuria 5 mg/dL, but was otherwise normal.  CBC with a white count 12.3, hemoglobin 12.8 g/dL and platelets 171.  PT, INR and PTT unremarkable.  Lactic acid was normal.  CMP with a glucose of 189, BUN 24 and creatinine 1.70 mg/dL.  The rest of the CMP was normal.  Phosphorus 2.3 mg/dL CT abdomen/pelvis showed high-grade distal SBO due to right Pott umbilical ventral hernia, which contains a loop of small bowel.  No evidence of bowel ischemia.  There were 2 additional right epigastric ventral hernias.  All the hernias were reduced easily by Dr. [name].  NGT was placed On IV fluids and admitted for further management General surgery was consulted by ED.  ?  ?Subjective: ?Seen this am ? Pt is alert awake oriented now. Confused last night ?Overnight NG tube got dislodged. ?Creat up at 1.6 ?Wbc at 12.2 ? ?Assessment and Plan: ? ?SBO ?Previous SBOs ?Multiple Ventral hernias reducible: ?NG got dislodged overnight. CCS on board- on conservative management, xray with contrast in the colon, advancing diet to clear liquid diet okay to leave NG out, continue abdominal binder for hernia. Cont gentle ivf and diet per CCS.  ? ?Type 2DM without complication, without long-term current use of insulin: stable cbg, cont ssi ?Recent Labs  ?Lab 07/29/21 ?1838 07/29/21 ?2323 07/30/21 ?0505   ?GLUCAP 157* 145* 122*  ?   ?Chronic atrial fibrillation s/p PM ?Tachybradycardia syndrome: ?Monitoring in telemetry. Xarelto remains on hold continue Lovenox dosing prophylactic.  Continue metoprolol 2.5 every 6 hours while n.p.o. ? ?Microcytic anemia:Stable.Monitor. ?Recent Labs  ?Lab 07/29/21 ?3244 07/31/21 ?0612  ?HGB 12.8* 11.2*  ?HCT 38.6* 36.4*  ?Chronic diastolic heart failure:needing IVF- cont gently.remains compensated.  Monitor fluid status ?Stage 3b chronic kidney disease: Renal function at baseline.  "Monitor ?Recent Labs  ?Lab 07/29/21 ?0102 07/30/21 ?0743 07/31/21 ?7253  ?BUN 24* 20 25*  ?CREATININE 1.70* 1.35* 1.62*  ?DVT prophylaxis: enoxaparin (LOVENOX) injection 30 mg Start: 07/31/21 1000 ?SCDs Start: 07/29/21 1246 ?Code Status:   Code Status: Full Code ?Family Communication: plan of care discussed with patient at bedside. ? ?Disposition: Currently not medically stable for discharge. ?Status is: Inpatient ?Remains inpatient appropriate because: For ongoing management of small bowel obstruction ?Encourage ambulation PT OT evaluation requested.  Lives alone granddaughter is involved in care. ? ?Objective: ?Vitals last 24 hrs: ?Vitals:  ? 07/30/21 1407 07/30/21 2032 07/31/21 0012 07/31/21 0339  ?BP: 132/63 (!) 147/94 (!) 143/93 132/82  ?Pulse: (!) 109 (!) 58 (!) 104 (!) 103  ?Resp: '16 16  18  '$ ?Temp: 97.6 ?F (36.4 ?C) 97.7 ?F (36.5 ?C)  98.3 ?F (36.8 ?C)  ?TempSrc: Oral Oral  Oral  ?SpO2: 95% 96%  96%  ?Weight:      ?Height:      ? ?Weight change:  ? ?Physical Examination: ?General exam: AA, elderly,older than stated age, weak appearing. ?HEENT:Oral mucosa moist, Ear/Nose WNL grossly, dentition normal. ?Respiratory system: bilaterally clear  no use of accessory muscle ?Cardiovascular system: S1 & S2 +, No JVD,. ?Gastrointestinal system: Abdomen soft,NT,ND,BS+, abdomen binder in place. ?Nervous System:Alert, awake, moving extremities and grossly nonfocal ?Extremities: LE ankle edema none, distal  peripheral pulses palpable.  ?Skin: No rashes,no icterus. ?MSK: Normal muscle bulk,tone, power  ? ?Medications reviewed:  ?Scheduled Meds: ? enoxaparin (LOVENOX) injection  30 mg Subcutaneous Q24H  ? mouth rinse  15 mL Mouth Rinse BID  ? metoprolol tartrate  2.5 mg Intravenous Q6H  ? pantoprazole (PROTONIX) IV  40 mg Intravenous Daily  ?Continuous Infusions: ? lactated ringers 50 mL/hr at 07/30/21 1848  ?  ?Diet Order   ? ?       ?  Diet clear liquid Room service appropriate? Yes; Fluid consistency: Thin  Diet effective now       ?  ? ?  ?  ? ?  ?  ?Intake/Output Summary (Last 24 hours) at 07/31/2021 1036 ?Last data filed at 07/31/2021 0900 ?Gross per 24 hour  ?Intake 1176.56 ml  ?Output 1175 ml  ?Net 1.56 ml  ? ?Net IO Since Admission: -712.25 mL [07/31/21 1036]  ?Wt Readings from Last 3 Encounters:  ?Aug 14, 2021 68 kg  ?04/25/21 68 kg  ?10/30/20 65.4 kg  ?  ? ?Unresulted Labs (From admission, onward)  ? ?  Start     Ordered  ? 07/31/21 0177  Basic metabolic panel  Daily,   R     ?Question:  Specimen collection method  Answer:  Lab=Lab collect  ? 07/30/21 0732  ? 07/31/21 0500  CBC  Daily,   R     ?Question:  Specimen collection method  Answer:  Lab=Lab collect  ? 07/30/21 0732  ? ?  ?  ? ?  ?Data Reviewed: I have personally reviewed following labs and imaging studies ?CBC: ?Recent Labs  ?Lab 2021/08/14 ?9390 07/31/21 ?0612  ?WBC 12.3* 12.2*  ?NEUTROABS 10.7*  --   ?HGB 12.8* 11.2*  ?HCT 38.6* 36.4*  ?MCV 77.8* 81.6  ?PLT 171 141*  ? ?Basic Metabolic Panel: ?Recent Labs  ?Lab 08-14-21 ?3009 08-14-21 ?1141 07/30/21 ?0743 07/31/21 ?2330  ?NA 138  --  139 140  ?K 4.6  --  4.8 4.9  ?CL 101  --  108 106  ?CO2 28  --  22 20*  ?GLUCOSE 189*  --  120* 98  ?BUN 24*  --  20 25*  ?CREATININE 1.70*  --  1.35* 1.62*  ?CALCIUM 9.2  --  8.2* 8.4*  ?MG  --  2.8*  --  2.6*  ?PHOS  --  2.3* 3.2  --   ? ?GFR: ?Estimated Creatinine Clearance: 26.2 mL/min (A) (by C-G formula based on SCr of 1.62 mg/dL (H)). ?Liver Function Tests: ?Recent Labs   ?Lab 14-Aug-2021 ?0943  ?AST 27  ?ALT 12  ?ALKPHOS 95  ?BILITOT 0.7  ?PROT 8.0  ?ALBUMIN 4.5  ? ?Recent Labs  ?Lab 08-14-21 ?1141  ?LIPASE 33  ? ?No results for input(s): AMMONIA in the last 168 hours. ?Coagulation Profile: ?Recent Labs  ?Lab 2021/08/14 ?0943  ?INR 1.1  ? ?Cardiac Enzymes: ?No results for input(s): CKTOTAL, CKMB, CKMBINDEX, TROPONINI in the last 168 hours. ?BNP (last 3 results) ?No results for input(s): PROBNP in the last 8760 hours. ?HbA1C: ?No results for input(s): HGBA1C in the last 72 hours. ?CBG: ?Recent Labs  ?Lab 2021/08/14 ?1838 08/14/2021 ?2323 07/30/21 ?0505  ?GLUCAP 157* 145* 122*  ? ?Lipid Profile: ?No results for input(s): CHOL, HDL, LDLCALC, TRIG, CHOLHDL,  LDLDIRECT in the last 72 hours. ?Thyroid Function Tests: ?No results for input(s): TSH, T4TOTAL, FREET4, T3FREE, THYROIDAB in the last 72 hours. ?Anemia Panel: ?No results for input(s): VITAMINB12, FOLATE, FERRITIN, TIBC, IRON, RETICCTPCT in the last 72 hours. ?Sepsis Labs: ?Recent Labs  ?Lab 07/29/21 ?1829 07/29/21 ?1143  ?LATICACIDVEN 1.7 1.5  ? ? ?Recent Results (from the past 240 hour(s))  ?Resp Panel by RT-PCR (Flu A&B, Covid) Nasopharyngeal Swab     Status: None  ? Collection Time: 07/29/21 11:38 AM  ? Specimen: Nasopharyngeal Swab; Nasopharyngeal(NP) swabs in vial transport medium  ?Result Value Ref Range Status  ? SARS Coronavirus 2 by RT PCR NEGATIVE NEGATIVE Final  ?  Comment: (NOTE) ?SARS-CoV-2 target nucleic acids are NOT DETECTED. ? ?The SARS-CoV-2 RNA is generally detectable in upper respiratory ?specimens during the acute phase of infection. The lowest ?concentration of SARS-CoV-2 viral copies this assay can detect is ?138 copies/mL. A negative result does not preclude SARS-Cov-2 ?infection and should not be used as the sole basis for treatment or ?other patient management decisions. A negative result may occur with  ?improper specimen collection/handling, submission of specimen other ?than nasopharyngeal swab, presence of  viral mutation(s) within the ?areas targeted by this assay, and inadequate number of viral ?copies(<138 copies/mL). A negative result must be combined with ?clinical observations, patient history, and epidemiolo

## 2021-08-01 LAB — BASIC METABOLIC PANEL
Anion gap: 9 (ref 5–15)
BUN: 23 mg/dL (ref 8–23)
CO2: 25 mmol/L (ref 22–32)
Calcium: 8.4 mg/dL — ABNORMAL LOW (ref 8.9–10.3)
Chloride: 106 mmol/L (ref 98–111)
Creatinine, Ser: 1.63 mg/dL — ABNORMAL HIGH (ref 0.61–1.24)
GFR, Estimated: 39 mL/min — ABNORMAL LOW (ref >60)
Glucose, Bld: 154 mg/dL — ABNORMAL HIGH (ref 70–99)
Potassium: 4.4 mmol/L (ref 3.5–5.1)
Sodium: 140 mmol/L (ref 135–145)

## 2021-08-01 LAB — CBC
HCT: 33.3 % — ABNORMAL LOW (ref 39.0–52.0)
Hemoglobin: 11 g/dL — ABNORMAL LOW (ref 13.0–17.0)
MCH: 25.8 pg — ABNORMAL LOW (ref 26.0–34.0)
MCHC: 33 g/dL (ref 30.0–36.0)
MCV: 78 fL — ABNORMAL LOW (ref 80.0–100.0)
Platelets: 134 10*3/uL — ABNORMAL LOW (ref 150–400)
RBC: 4.27 MIL/uL (ref 4.22–5.81)
RDW: 16.6 % — ABNORMAL HIGH (ref 11.5–15.5)
WBC: 11.3 10*3/uL — ABNORMAL HIGH (ref 4.0–10.5)
nRBC: 0 % (ref 0.0–0.2)

## 2021-08-01 LAB — GLUCOSE, CAPILLARY
Glucose-Capillary: 188 mg/dL — ABNORMAL HIGH (ref 70–99)
Glucose-Capillary: 179 mg/dL — ABNORMAL HIGH (ref 70–99)
Glucose-Capillary: 143 mg/dL — ABNORMAL HIGH (ref 70–99)
Glucose-Capillary: 214 mg/dL — ABNORMAL HIGH (ref 70–99)

## 2021-08-01 MED ORDER — METOPROLOL SUCCINATE ER 25 MG PO TB24
25.0000 mg | ORAL_TABLET | Freq: Every day | ORAL | Status: DC
  Administered 2021-08-01: 25 mg via ORAL
  Filled 2021-08-01: qty 1

## 2021-08-01 MED ORDER — MELATONIN 3 MG PO TABS: 3.0000 mg | ORAL_TABLET | Freq: Every evening | ORAL | 0 refills | Status: AC | PRN

## 2021-08-01 MED ORDER — RIVAROXABAN 15 MG PO TABS: 15.0000 mg | ORAL_TABLET | Freq: Every day | ORAL | Status: DC

## 2021-08-01 NOTE — Progress Notes (Signed)
RN noted blood tinged urine in standard drainage bag.  Previous shift RN had noted this in assessment but stated in report that it had resolved.  On call notified by this RN, no new orders.  Will continue to monitor.  ?

## 2021-08-01 NOTE — Progress Notes (Signed)
?PROGRESS NOTE ?[name]  AVW:[id number] DOB: April 30, 1924 DOA: 07/29/2021 ?PCP: [name], MD  ? ?Brief Narrative/Hospital Course: ?89 yom with medical history significant of osteoarthritis, chronic atrial fibrillation on Xarelto, stage III CKD, hiatal hernia, ventral hernia x3, hypertension, pacemaker placement, tachycardia-bradycardia syndrome, type 2 diabetes, history of previous SBO brought to the ED w/ c/o generalized abdominal pain, abdominal distention, nausea/emesis x 1 day and  constipation x 4 days ?In ED vitals- no fever,pulse 106, respiration 21, BP 131/96 mmHg and O2 sat 98% on room air. labs His urinalysis showed ketonuria 5 mg/dL, but was otherwise normal.  CBC with a white count 12.3, hemoglobin 12.8 g/dL and platelets 171.  PT, INR and PTT unremarkable.  Lactic acid was normal.  CMP with a glucose of 189, BUN 24 and creatinine 1.70 mg/dL.  The rest of the CMP was normal.  Phosphorus 2.3 mg/dL CT abdomen/pelvis showed high-grade distal SBO due to right Pott umbilical ventral hernia, which contains a loop of small bowel.  No evidence of bowel ischemia.  There were 2 additional right epigastric ventral hernias.  All the hernias were reduced easily by Dr. [name].  NGT was placed On IV fluids and admitted for further management General surgery was consulted by ED.NG got dislodged 3/13 pm-CCS on board- managed with conservative management, xray 07/31/21 with contrast in the colon,advanced diet  and to continue abdominal binder for hernia. PTOT for dispo.  ?  ?Subjective: ? ?Seen and examined.  Patient is alert awake oriented at baseline.  Has a one-to-one sitter due to his confusion mostly during night, reportedly had a bowel movement.   ? ?Assessment and Plan: ? ?SBO ?Previous SBOs ?Multiple Ventral hernias reducible: ?NG got dislodged overnight 3/13- and has been off-appreciate surgery input on board, xray showed contrast in the colon-diet is being advanced, for soft diet in the  morning.  Monitor bowel movement.  PT OT and ambulation today. ? ?Type 2DM without complication, without long-term current use of insulin: Blood sugar is stable,cont ssi ?Recent Labs  ?Lab 07/31/21 ?5374 07/31/21 ?1140 07/31/21 ?1718 07/31/21 ?2052 08/01/21 ?0745  ?GLUCAP 118* 232* 188* 179* 143*  ?   ?Chronic atrial fibrillation s/p PM ?Tachybradycardia syndrome: ?We will resume his home Xarelto and metoprolol.  ? ?Microcytic anemia:Stable.Monitor. ?Recent Labs  ?Lab 07/29/21 ?8270 07/31/21 ?0612 08/01/21 ?0501  ?HGB 12.8* 11.2* 11.0*  ?HCT 38.6* 36.4* 33.3*  ? ?Chronic diastolic heart failure: Encourage p.o. off IV fluids currently compensated.   ?Number ? ?Non Compliance with medication: It appears he has not been consistently taking his oral medication he lives himself granddaughter closely involved.   ? ?Stage 3b chronic kidney disease: Renal function at baseline.  "Monitor ?Recent Labs  ?Lab 07/29/21 ?7867 07/30/21 ?0743 07/31/21 ?5449 08/01/21 ?0501  ?BUN 24* 20 25* 23  ?CREATININE 1.70* 1.35* 1.62* 1.63*  ? ?DVT prophylaxis: SCDs Start: 07/29/21 1246 xarelto ?Code Status:   Code Status: Full Code  ?Family Communication: plan of care discussed with patient at bedside. ?Called Granddaughter Izell [place] to update- no answer- left VM. ? ?Disposition: Currently not medically stable for discharge. ?Status is: Inpatient ?Remains inpatient appropriate because: For ongoing management of small bowel obstruction ?Encourage ambulation PT OT evaluation requested.  Lives alone granddaughter is involved in care. ? ?Objective: ?Vitals last 24 hrs: ?Vitals:  ? 07/31/21 0339 07/31/21 1317 07/31/21 2047 08/01/21 0405  ?BP: 132/82 135/89 (!) 145/96 (!) 139/99  ?Pulse: (!) 103 88 (!) 107 (!) 109  ?Resp: '18 16 16 16  '$ ?  Temp: 98.3 ?F (36.8 ?C) 98 ?F (36.7 ?C) 98 ?F (36.7 ?C) 97.8 ?F (36.6 ?C)  ?TempSrc: Oral Oral  Oral  ?SpO2: 96%  97% 100%  ?Weight:      ?Height:      ? ?Weight change:  ? ?Physical Examination: ?General exam: AA,older  than stated age, weak appearing. ?HEENT:Oral mucosa moist, Ear/Nose WNL grossly, dentition normal. ?Respiratory system: bilaterally diminished,no use of accessory muscle ?Cardiovascular system: S1 & S2 +, No JVD,. ?Gastrointestinal system: Abdomen soft,NT,ND, BS+ ?Nervous System:Alert, awake, moving extremities and grossly nonfocal ?Extremities: edema neg,distal peripheral pulses palpable.  ?Skin: No rashes,no icterus. ?MSK: Normal muscle bulk,tone, power ? ?Medications reviewed:  ?Scheduled Meds: ? insulin aspart  0-9 Units Subcutaneous TID WC  ? mouth rinse  15 mL Mouth Rinse BID  ? metoprolol succinate  25 mg Oral Daily  ? pantoprazole (PROTONIX) IV  40 mg Intravenous Daily  ? Rivaroxaban  15 mg Oral Q supper  ?Continuous Infusions: ? ?  ?Diet Order   ? ?       ?  DIET SOFT Room service appropriate? Yes; Fluid consistency: Thin  Diet effective 0500 tomorrow       ?  ? ?  ?  ? ?  ?  ?Intake/Output Summary (Last 24 hours) at 08/01/2021 1127 ?Last data filed at 08/01/2021 0947 ?Gross per 24 hour  ?Intake 1982 ml  ?Output 601 ml  ?Net 1381 ml  ? ?Net IO Since Admission: 587.75 mL [08/01/21 1127]  ?Wt Readings from Last 3 Encounters:  ?11-Aug-2021 68 kg  ?04/25/21 68 kg  ?10/30/20 65.4 kg  ?  ? ?Unresulted Labs (From admission, onward)  ? ?  Start     Ordered  ? 07/31/21 2202  Basic metabolic panel  Daily,   R     ?Question:  Specimen collection method  Answer:  Lab=Lab collect  ? 07/30/21 0732  ? 07/31/21 0500  CBC  Daily,   R     ?Question:  Specimen collection method  Answer:  Lab=Lab collect  ? 07/30/21 0732  ? ?  ?  ? ?  ?Data Reviewed: I have personally reviewed following labs and imaging studies ?CBC: ?Recent Labs  ?Lab 2021-08-11 ?5427 07/31/21 ?0612 08/01/21 ?0501  ?WBC 12.3* 12.2* 11.3*  ?NEUTROABS 10.7*  --   --   ?HGB 12.8* 11.2* 11.0*  ?HCT 38.6* 36.4* 33.3*  ?MCV 77.8* 81.6 78.0*  ?PLT 171 141* 134*  ? ?Basic Metabolic Panel: ?Recent Labs  ?Lab 08/11/2021 ?0623 2021/08/11 ?1141 07/30/21 ?0743 07/31/21 ?7628  08/01/21 ?0501  ?NA 138  --  139 140 140  ?K 4.6  --  4.8 4.9 4.4  ?CL 101  --  108 106 106  ?CO2 28  --  22 20* 25  ?GLUCOSE 189*  --  120* 98 154*  ?BUN 24*  --  20 25* 23  ?CREATININE 1.70*  --  1.35* 1.62* 1.63*  ?CALCIUM 9.2  --  8.2* 8.4* 8.4*  ?MG  --  2.8*  --  2.6*  --   ?PHOS  --  2.3* 3.2  --   --   ? ?GFR: ?Estimated Creatinine Clearance: 26.1 mL/min (A) (by C-G formula based on SCr of 1.63 mg/dL (H)). ?Liver Function Tests: ?Recent Labs  ?Lab 08/11/21 ?0943  ?AST 27  ?ALT 12  ?ALKPHOS 95  ?BILITOT 0.7  ?PROT 8.0  ?ALBUMIN 4.5  ? ?Recent Labs  ?Lab Aug 11, 2021 ?1141  ?LIPASE 33  ? ?No results for input(s):  AMMONIA in the last 168 hours. ?Coagulation Profile: ?Recent Labs  ?Lab 07/29/21 ?0943  ?INR 1.1  ? ?Cardiac Enzymes: ?No results for input(s): CKTOTAL, CKMB, CKMBINDEX, TROPONINI in the last 168 hours. ?BNP (last 3 results) ?No results for input(s): PROBNP in the last 8760 hours. ?HbA1C: ?No results for input(s): HGBA1C in the last 72 hours. ?CBG: ?Recent Labs  ?Lab 07/31/21 ?6333 07/31/21 ?1140 07/31/21 ?1718 07/31/21 ?2052 08/01/21 ?0745  ?GLUCAP 118* 232* 188* 179* 143*  ? ?Lipid Profile: ?No results for input(s): CHOL, HDL, LDLCALC, TRIG, CHOLHDL, LDLDIRECT in the last 72 hours. ?Thyroid Function Tests: ?No results for input(s): TSH, T4TOTAL, FREET4, T3FREE, THYROIDAB in the last 72 hours. ?Anemia Panel: ?No results for input(s): VITAMINB12, FOLATE, FERRITIN, TIBC, IRON, RETICCTPCT in the last 72 hours. ?Sepsis Labs: ?Recent Labs  ?Lab 07/29/21 ?5456 07/29/21 ?1143  ?LATICACIDVEN 1.7 1.5  ? ? ?Recent Results (from the past 240 hour(s))  ?Resp Panel by RT-PCR (Flu A&B, Covid) Nasopharyngeal Swab     Status: None  ? Collection Time: 07/29/21 11:38 AM  ? Specimen: Nasopharyngeal Swab; Nasopharyngeal(NP) swabs in vial transport medium  ?Result Value Ref Range Status  ? SARS Coronavirus 2 by RT PCR NEGATIVE NEGATIVE Final  ?  Comment: (NOTE) ?SARS-CoV-2 target nucleic acids are NOT DETECTED. ? ?The  SARS-CoV-2 RNA is generally detectable in upper respiratory ?specimens during the acute phase of infection. The lowest ?concentration of SARS-CoV-2 viral copies this assay can detect is ?138 copies/mL. A negative resu

## 2021-08-01 NOTE — Evaluation (Addendum)
Physical Therapy Evaluation ?Patient Details ?Name: [name] ?MRN: [id number] ?DOB: July 22, 1923 ?Today's Date: 08/01/2021 ? ?History of Present Illness ? 89 yom with medical history significant of osteoarthritis, chronic atrial fibrillation on Xarelto, stage III CKD, hiatal hernia, ventral hernia x3, hypertension, pacemaker placement, tachycardia-bradycardia syndrome, type 2 diabetes, history of previous SBO brought to the ED w/ c/o generalized abdominal pain, abdominal distention, nausea/emesis x 1 day and  constipation x 4 days. Dx of SBO, multiple ventral hernias.  ?Clinical Impression ? Pt ambulated 200' with RW, no loss of balance, HR 88 at rest, HR 135 max with walking. He is mobilizing well enough to DC home. RW recommended. Nurse tech reports pt has had some mild confusion, therefore assistance with medication management, transportation, and cooking/clearing is recommended. He is able to follow commands and provide home history, he is quite HOH so detailed information on prior level of function is limited, though he reports walking with out an assistive device at baseline and denies falls in the past 6 months. No further PT indicated, will sign off.  ?   ?   ? ?Recommendations for follow up therapy are one component of a multi-disciplinary discharge planning process, led by the attending physician.  Recommendations may be updated based on patient status, additional functional criteria and insurance authorization. ? ?Follow Up Recommendations No PT follow up ? ?  ?Assistance Recommended at Discharge Set up Supervision/Assistance  ?Patient can return home with the following ? Assistance with cooking/housework;Direct supervision/assist for medications management;Direct supervision/assist for financial management;Assist for transportation ? ?  ?Equipment Recommendations Rolling walker (2 wheels)  ?Recommendations for Other Services ?    ?  ?Functional Status Assessment Patient has not had a recent decline  in their functional status  ? ?  ?Precautions / Restrictions Precautions ?Precautions: Fall ?Precaution Comments: HOH, monitor HR ?Restrictions ?Weight Bearing Restrictions: No  ? ?  ? ?Mobility ? Bed Mobility ?Overal bed mobility: Modified Independent ?  ?  ?  ?  ?  ?  ?General bed mobility comments: used rail ?  ? ?Transfers ?Overall transfer level: Needs assistance ?Equipment used: Rolling walker (2 wheels) ?Transfers: Sit to/from Stand ?Sit to Stand: Supervision ?  ?  ?  ?  ?  ?  ?  ? ?Ambulation/Gait ?Ambulation/Gait assistance: Supervision ?Gait Distance (Feet): 200 Feet ?Assistive device: Rolling walker (2 wheels) ?Gait Pattern/deviations: WFL(Within Functional Limits), Step-through pattern ?Gait velocity: WFL ?  ?  ?General Gait Details: steady, no loss of balance, HR 88 at rest, then up to 135 max with walking, no dyspnea ? ?Stairs ?  ?  ?  ?  ?  ? ?Wheelchair Mobility ?  ? ?Modified Rankin (Stroke Patients Only) ?  ? ?  ? ?Balance Overall balance assessment: Modified Independent ?  ?  ?  ?  ?  ?  ?  ?  ?  ?  ?  ?  ?  ?  ?  ?  ?  ?  ?   ? ? ? ?Pertinent Vitals/Pain Pain Assessment ?Pain Assessment: No/denies pain  ? ? ?Home Living Family/patient expects to be discharged to:: Private residence ?Living Arrangements: Alone ?Available Help at Discharge: Family ?Type of Home: House ?Home Access: Stairs to enter ?  ?Entrance Stairs-Number of Steps: 2 per chart patient reports no steps ?  ?Home Layout: One level ?Home Equipment: Conservation officer, nature (2 wheels) ?Additional Comments: patients info taken from history. patient noted to have some confusion and HOH for most questions, pt stated  his grand daughter stays with him in the evenings (she works during the day)  ?  ?Prior Function Prior Level of Function : Independent/Modified Independent ?  ?  ?  ?  ?  ?  ?Mobility Comments: walked without AD ?  ?  ? ? ?Hand Dominance  ? Dominant Hand: Right ? ?  ?Extremity/Trunk Assessment  ? Upper Extremity Assessment ?Upper  Extremity Assessment: Defer to OT evaluation ?  ? ?Lower Extremity Assessment ?Lower Extremity Assessment: Overall WFL for tasks assessed ?  ? ?Cervical / Trunk Assessment ?Cervical / Trunk Assessment: Normal  ?Communication  ? Communication: HOH  ?Cognition Arousal/Alertness: Awake/alert ?Behavior During Therapy: [facility] for tasks assessed/performed ?Overall Cognitive Status: Difficult to assess ?  ?  ?  ?  ?  ?  ?  ?  ?  ?  ?  ?  ?  ?  ?  ?  ?General Comments: patient is pleasant. HOH limiting questions. patient reported oriented to month, year, day of week and place on this date. ?  ?  ? ?  ?General Comments   ? ?  ?Exercises    ? ?Assessment/Plan  ?  ?PT Assessment Patient does not need any further PT services  ?PT Problem List   ? ?   ?  ?PT Treatment Interventions     ? ?PT Goals (Current goals can be found in the Care Plan section)  ?Acute Rehab PT Goals ?PT Goal Formulation: All assessment and education complete, DC therapy ? ?  ?Frequency   ?  ? ? ?Co-evaluation   ?  ?  ?  ?  ? ? ?  ?AM-PAC PT "6 Clicks" Mobility  ?Outcome Measure Help needed turning from your back to your side while in a flat bed without using bedrails?: None ?Help needed moving from lying on your back to sitting on the side of a flat bed without using bedrails?: None ?Help needed moving to and from a bed to a chair (including a wheelchair)?: None ?Help needed standing up from a chair using your arms (e.g., wheelchair or bedside chair)?: None ?Help needed to walk in hospital room?: None ?Help needed climbing 3-5 steps with a railing? : None ?6 Click Score: 24 ? ?  ?End of Session Equipment Utilized During Treatment: Gait belt ?Activity Tolerance: Patient tolerated treatment well ?Patient left: in chair;with call bell/phone within reach;with nursing/sitter in room;with chair alarm set ?Nurse Communication: Mobility status ?  ?  ? ?Time: 1610-9604 ?PT Time Calculation (min) (ACUTE ONLY): 18 min ? ? ?Charges:   PT Evaluation ?$PT Eval Low  Complexity: 1 Low ?  ?  ?   ? ?Blondell Reveal Kistler PT 08/01/2021  ?Acute Rehabilitation Services ?Pager [phone] ?Office [phone] ? ? ?

## 2021-08-01 NOTE — Discharge Summary (Signed)
Physician Discharge Summary  ?[name]:[id number] DOB: 1924-02-01 DOA: 07/29/2021 ? ?PCP: [name], MD ? ?Admit date: 07/29/2021 ?Discharge date: 08/01/2021 ?Recommendations for Outpatient Follow-up:  ?Follow up with PCP in 1 weeks-call for appointment ?Please obtain BMP/CBC in one week ? ?Discharge Dispo: home health ?Discharge Condition: Stable ?Code Status:   Code Status: Full Code ?Diet recommendation:  ?Diet Order   ? ?       ?  DIET SOFT Room service appropriate? Yes; Fluid consistency: Thin  Diet effective 0500 tomorrow       ?  ?  Diet - low sodium heart healthy       ?  ?  Diet Carb Modified       ?  ? ?  ?  ? ?  ?  ? ?Brief/Interim Summary: ?62 yom with medical history significant of osteoarthritis, chronic atrial fibrillation on Xarelto, stage III CKD, hiatal hernia, ventral hernia x3, hypertension, pacemaker placement, tachycardia-bradycardia syndrome, type 2 diabetes, memory problems, history of previous SBO brought to the ED w/ c/o generalized abdominal pain, abdominal distention, nausea/emesis x 1 day and  constipation x 4 days ?In ED vitals- no fever,pulse 106, respiration 21, BP 131/96 mmHg and O2 sat 98% on room air. labs His urinalysis showed ketonuria 5 mg/dL, but was otherwise normal.  CBC with a white count 12.3, hemoglobin 12.8 g/dL and platelets 171.  PT, INR and PTT unremarkable.  Lactic acid was normal.  CMP with a glucose of 189, BUN 24 and creatinine 1.70 mg/dL.  The rest of the CMP was normal.  Phosphorus 2.3 mg/dL CT abdomen/pelvis showed high-grade distal SBO due to right Pott umbilical ventral hernia, which contains a loop of small bowel.  No evidence of bowel ischemia.  There were 2 additional right epigastric ventral hernias.  All the hernias were reduced easily by Dr. [name].  NGT was placed On IV fluids and admitted for further management General surgery was consulted by ED.NG got dislodged 3/13 pm-CCS on board- managed with conservative management, xray  07/31/21 with contrast in the colon,advanced diet  and to continue abdominal binder for hernia. PTOT eval completed a and doing well.He has been getting sundowning and confusion during night and has appointment to see neurology on 08/08/21 for diagnosis of dementia. Grandaughter will provide care during night and currently has 11 hrs RN care daytime.   ?He is being discharged home today, surgery okay and family also okay with the plan. ? ?Discharge Diagnoses:  ?Principal Problem: ?  SBO (small bowel obstruction) ([place]) ?Active Problems: ?  Type 2 diabetes mellitus without complication, without long-term current use of insulin ([name]) ?  BRADYCARDIA-TACHYCARDIA SYNDROME ?  Microcytic anemia ?  Chronic diastolic heart failure ([name]) ?  Stage 3b chronic kidney disease ([name]) ?  Hypophosphatemia ?  Chronic atrial fibrillation with RVR ([name]) ? ?SBO ?Previous SBOs ?Multiple Ventral hernias reducible: ?Resolved. Tolerating soft diet.cont abd binder ? ?Type 2DM without complication, without long-term current use of insulin: stable cbg, diet controlled ? ?Chronic atrial fibrillation s/p PM ?Tachybradycardia syndrome: ?Cont xarelto and metoprolol ?  ?Microcytic anemia:Stable.Monitor. ?Chronic diastolic heart failure:needing IVF- cont gently.remains compensated.  Monitor fluid status ?Stage 3b chronic kidney disease: Renal function at baseline. ?Memory issues/dementia- will see neruo next week. Cont supervision and 24x7 care at home ? ?Consults: ?CCS ?Subjective: ?[place], doing well, tolerating diet. ? ?Discharge Exam: ?Vitals:  ? 08/01/21 0405 08/01/21 1423  ?BP: (!) 139/99 127/85  ?Pulse: (!) 109 66  ?Resp: 16  15  ?Temp: 97.8 ?F (36.6 ?C) 97.6 ?F (36.4 ?C)  ?SpO2: 100% 97%  ? ?General: Pt is alert, awake, not in acute distress ?Cardiovascular: RRR, S1/S2 +, no rubs, no gallops ?Respiratory: CTA bilaterally, no wheezing, no rhonchi ?Abdominal: Soft, NT, ND, bowel sounds + ?Extremities: no edema, no cyanosis ? ?Discharge  Instructions ? ?Discharge Instructions   ? ? Diet - low sodium heart healthy   Complete by: As directed ?  ? Diet Carb Modified   Complete by: As directed ?  ? Discharge instructions   Complete by: As directed ?  ? Please call call MD or return to ER for similar or worsening recurring problem that brought you to hospital or if any fever,nausea/vomiting,abdominal pain, uncontrolled pain, chest pain,  shortness of breath or any other alarming symptoms. ? ?Please follow-up your doctor as instructed in a week time and call the office for appointment. ? ?Please avoid alcohol, smoking, or any other illicit substance and maintain healthy habits including taking your regular medications as prescribed. ? ?You were cared for by a hospitalist during your hospital stay. If you have any questions about your discharge medications or the care you received while you were in the hospital after you are discharged, you can call the unit and ask to speak with the hospitalist on call if the hospitalist that took care of you is not available. ? ?Once you are discharged, your primary care physician will handle any further medical issues. Please note that NO REFILLS for any discharge medications will be authorized once you are discharged, as it is imperative that you return to your primary care physician (or establish a relationship with a primary care physician if you do not have one) for your aftercare needs so that they can reassess your need for medications and monitor your lab values  ? Discharge instructions   Complete by: As directed ?  ? Please call call MD or return to ER for similar or worsening recurring problem that brought you to hospital or if any fever,nausea/vomiting,abdominal pain, uncontrolled pain, chest pain,  shortness of breath or any other alarming symptoms. ? ?Please follow-up your doctor as instructed in a week time and call the office for appointment. ? ?Please avoid alcohol, smoking, or any other illicit  substance and maintain healthy habits including taking your regular medications as prescribed. ? ?You were cared for by a hospitalist during your hospital stay. If you have any questions about your discharge medications or the care you received while you were in the hospital after you are discharged, you can call the unit and ask to speak with the hospitalist on call if the hospitalist that took care of you is not available. ? ?Once you are discharged, your primary care physician will handle any further medical issues. Please note that NO REFILLS for any discharge medications will be authorized once you are discharged, as it is imperative that you return to your primary care physician (or establish a relationship with a primary care physician if you do not have one) for your aftercare needs so that they can reassess your need for medications and monitor your lab values  ? Discharge instructions   Complete by: As directed ?  ? Please call call MD or return to ER for similar or worsening recurring problem that brought you to hospital or if any fever,nausea/vomiting,abdominal pain, uncontrolled pain, chest pain,  shortness of breath or any other alarming symptoms. ? ?Please follow-up your doctor as instructed in a week  time and call the office for appointment. ? ?Please avoid alcohol, smoking, or any other illicit substance and maintain healthy habits including taking your regular medications as prescribed. ? ?You were cared for by a hospitalist during your hospital stay. If you have any questions about your discharge medications or the care you received while you were in the hospital after you are discharged, you can call the unit and ask to speak with the hospitalist on call if the hospitalist that took care of you is not available. ? ?Once you are discharged, your primary care physician will handle any further medical issues. Please note that NO REFILLS for any discharge medications will be authorized once you are  discharged, as it is imperative that you return to your primary care physician (or establish a relationship with a primary care physician if you do not have one) for your aftercare needs so that they can reassess your need for

## 2021-08-01 NOTE — TOC Transition Note (Signed)
Transition of Care (TOC) - CM/SW Discharge Note ? ? ?Patient Details  ?Name: [name] ?MRN: [id number] ?Date of Birth: October 28, 1923 ? ?Transition of Care (TOC) CM/SW Contact:  ?[place], LCSW ?Phone Number: ?08/01/2021, 4:07 PM ? ? ?Clinical Narrative:    ? ?CSW was on group call with Eddie Dibbles from [facility], April with [facility], and pt's grandaughter. Services available were explained to pt's grandaughter; that [place] and Medicare would not provide 24/7 care. Any additional care would have to be provided by family or paid out of pocket. April with the [place] confirmed they would put in a request to increase pt's PCS hours some. CSW will arrange HH. Grandaughter states that she spoke with pt's [place] PCP office and that they will actually complete LTC SNF application for [facility]. Daughter will pick pt up and her and her husband will be staying with pt and assisting him.  ? ?CSW arranged HH PT/RN/Aide with [facility].  ? ?  ?Barriers to Discharge: No Barriers Identified ? ? ?Patient Goals and CMS Choice ?  ?  ?  ? ?Discharge Placement ?  ?           ?  ?  ?Name of family member notified: Grandaughter Izell Talbot ?Patient and family notified of of transfer: 08/01/21 ? ?Discharge Plan and Services ?  ?  ?           ?  ?DME Agency: [place] ?  ?  ?  ?  ?  ?  ?  ?  ? ?Social Determinants of Health (SDOH) Interventions ?  ? ? ?Readmission Risk Interventions ?No flowsheet data found. ? ? ? ? ?

## 2021-08-01 NOTE — Evaluation (Signed)
Occupational Therapy Evaluation ?Patient Details ?Name: [name] ?MRN: [id number] ?DOB: 09/14/1923 ?Today's Date: 08/01/2021 ? ? ?History of Present Illness 88 year old male  with medical history significant of osteoarthritis, chronic atrial fibrillation on Xarelto, stage III CKD, hiatal hernia, ventral hernia x3, hypertension, pacemaker placement, tachycardia-bradycardia syndrome, type 2 diabetes, history of previous SBO brought to the ED w/ c/o generalized abdominal pain, abdominal distention, nausea/emesis x 1 day and  constipation x 4 days. Dx of SBO, multiple ventral hernias.  ? ?Clinical Impression ?  ?Patient is a 88 year old male who reported living at home alone with no family in room to verify at this time. Patient was noted to have Hr reach 120-130bpm with all movements and tasks. Patient was noted to have poor safety awareness decreased activity tolerance and decreased hearing impacting participation in ADLs. Patient would be most successful in next level of care with 24/7 support for safety.  Patient would continue to benefit from skilled OT services at this time while admitted  to address noted deficits in order to improve overall safety and independence in ADLs.  ?  ?   ? ?Recommendations for follow up therapy are one component of a multi-disciplinary discharge planning process, led by the attending physician.  Recommendations may be updated based on patient status, additional functional criteria and insurance authorization.  ? ?Follow Up Recommendations ? No OT follow up  ?  ?Assistance Recommended at Discharge Frequent or constant Supervision/Assistance  ?Patient can return home with the following A little help with walking and/or transfers;A little help with bathing/dressing/bathroom;Assistance with cooking/housework;Direct supervision/assist for financial management;Assist for transportation;Help with stairs or ramp for entrance;Direct supervision/assist for medications management ? ?   ?Functional Status Assessment ? Patient has had a recent decline in their functional status and demonstrates the ability to make significant improvements in function in a reasonable and predictable amount of time.  ?Equipment Recommendations ? None recommended by OT  ?  ?Recommendations for Other Services   ? ? ?  ?Precautions / Restrictions Precautions ?Precautions: Fall ?Precaution Comments: HOH, monitor HR ?Restrictions ?Weight Bearing Restrictions: No  ? ?  ? ?Mobility Bed Mobility ?Overal bed mobility: Modified Independent ?  ?  ?  ?  ?  ?  ?  ?  ? ?Transfers ?  ?  ?  ?  ?  ?  ?  ?  ?  ?  ?  ? ?  ?Balance Overall balance assessment: No apparent balance deficits (not formally assessed) ?  ?  ?  ?  ?  ?  ?  ?  ?  ?  ?  ?  ?  ?  ?  ?  ?  ?  ?   ? ?ADL either performed or assessed with clinical judgement  ? ?ADL Overall ADL's : Needs assistance/impaired ?Eating/Feeding: Set up;Sitting ?  ?Grooming: Wash/dry face;Sitting;Set up ?  ?Upper Body Bathing: Minimal assistance;Sitting ?  ?Lower Body Bathing: Moderate assistance;Sit to/from stand ?  ?Upper Body Dressing : Minimal assistance;Sitting ?  ?Lower Body Dressing: Minimal assistance;Sit to/from stand;Sitting/lateral leans ?Lower Body Dressing Details (indicate cue type and reason): patient was able to don/doff socks sitting on edge of bed. ?Toilet Transfer: Minimal assistance;Rolling walker (2 wheels);Ambulation ?Toilet Transfer Details (indicate cue type and reason): patient was noted to have HR increase to 130 bpm with attempts at standing walking with RW. ?Toileting- Clothing Manipulation and Hygiene: Minimal assistance;Sit to/from stand ?  ?  ?  ?Functional mobility during ADLs: Min  guard;Rolling walker (2 wheels) ?General ADL Comments: with increased time  ? ? ? ?Vision Baseline Vision/History: 1 Wears glasses ?Patient Visual Report: No change from baseline ?   ?   ?Perception   ?  ?Praxis   ?  ? ?Pertinent Vitals/Pain Pain Assessment ?Pain Assessment:  No/denies pain  ? ? ? ?Hand Dominance Right ?  ?Extremity/Trunk Assessment Upper Extremity Assessment ?Upper Extremity Assessment: Overall WFL for tasks assessed ?  ?Lower Extremity Assessment ?Lower Extremity Assessment: Defer to PT evaluation ?  ?Cervical / Trunk Assessment ?Cervical / Trunk Assessment: Normal ?  ?Communication Communication ?Communication: HOH ?  ?Cognition Arousal/Alertness: Awake/alert ?Behavior During Therapy: [facility] for tasks assessed/performed ?Overall Cognitive Status: Difficult to assess ?  ?  ?  ?  ?  ?  ?  ?  ?  ?  ?  ?  ?  ?  ?  ?  ?General Comments: patient is plesant. HOH limiting questions. patient reported oriented to month, year, day of week and place on this date. ?  ?  ?General Comments    ? ?  ?Exercises   ?  ?Shoulder Instructions    ? ? ?Home Living Family/patient expects to be discharged to:: Private residence ?Living Arrangements: Alone ?Available Help at Discharge: Family ?Type of Home: House ?Home Access: Stairs to enter ?Entrance Stairs-Number of Steps: 2 per chart patient reports no steps ?  ?Home Layout: One level ?  ?  ?Bathroom Shower/Tub: Tub/shower unit ?  ?Bathroom Toilet: Standard ?  ?  ?Home Equipment: Conservation officer, nature (2 wheels) ?  ?Additional Comments: patients info taken from history. patient noted to have some confusion and HOH for most questions, pt stated his grand daughter stays with him in the evenings (she works during the day) ?  ? ?  ?Prior Functioning/Environment Prior Level of Function : Independent/Modified Independent ?  ?  ?  ?  ?  ?  ?Mobility Comments: walked without AD ?  ?  ? ?  ?  ?OT Problem List: Decreased activity tolerance;Impaired balance (sitting and/or standing);Decreased safety awareness;Decreased cognition;Cardiopulmonary status limiting activity ?  ?   ?OT Treatment/Interventions: Self-care/ADL training;Therapeutic exercise;Neuromuscular education;Therapeutic activities;Energy conservation;DME and/or AE instruction;Balance  training;Patient/family education  ?  ?OT Goals(Current goals can be found in the care plan section) Acute Rehab OT Goals ?Patient Stated Goal: to go back home ?OT Goal Formulation: Patient unable to participate in goal setting ?Time For Goal Achievement: 08/15/21 ?Potential to Achieve Goals: Fair  ?OT Frequency: Min 2X/week ?  ? ?Co-evaluation   ?  ?  ?  ?  ? ?  ?AM-PAC OT "6 Clicks" Daily Activity     ?Outcome Measure Help from another person eating meals?: None ?Help from another person taking care of personal grooming?: A Little ?Help from another person toileting, which includes using toliet, bedpan, or urinal?: A Little ?Help from another person bathing (including washing, rinsing, drying)?: A Little ?Help from another person to put on and taking off regular upper body clothing?: A Little ?Help from another person to put on and taking off regular lower body clothing?: A Little ?6 Click Score: 19 ?  ?End of Session Equipment Utilized During Treatment: Gait belt;Rolling walker (2 wheels) ?Nurse Communication: Mobility status;Other (comment) (HR with movement) ? ?Activity Tolerance: Patient tolerated treatment well ?Patient left: in bed;with call bell/phone within reach;with bed alarm set;with nursing/sitter in room ? ?OT Visit Diagnosis: Unsteadiness on feet (R26.81);Muscle weakness (generalized) (M62.81)  ?              ?  Time: 2194-7125 ?OT Time Calculation (min): 17 min ?Charges:  OT General Charges ?$OT Visit: 1 Visit ?OT Evaluation ?$OT Eval Low Complexity: 1 Low ? ?Rilei Kravitz OTR/L, MS ?Acute Rehabilitation Department ?Office# [phone] ?Pager# [phone] ? ? ?[place] ?08/01/2021, 12:45 PM ?

## 2021-08-01 NOTE — Telephone Encounter (Signed)
Patient is currently admitted. Will review AVS before refilling ? ?

## 2021-08-01 NOTE — Progress Notes (Signed)
? ? ?   ?Subjective: ?CC: ?Patient tolerating full liquids without n/v or abdominal pain since I saw him yesterday. Still passing flatus. Reports large bm yesterday that was liquidy with some formed pieces  ? ?Objective: ?Vital signs in last 24 hours: ?Temp:  [97.8 ?F (36.6 ?C)-98 ?F (36.7 ?C)] 97.8 ?F (36.6 ?C) (03/15 0405) ?Pulse Rate:  [88-109] 109 (03/15 0405) ?Resp:  [16] 16 (03/15 0405) ?BP: (135-145)/(89-99) 139/99 (03/15 0405) ?SpO2:  [97 %-100 %] 100 % (03/15 0405) ?Last BM Date : 07/31/21 ? ?Intake/Output from previous day: ?03/14 0701 - 03/15 0700 ?In: 2244 [P.O.:2000; I.V.:244] ?Out: 502 [Urine:500; Stool:2] ?Intake/Output this shift: ?No intake/output data recorded. ? ?PE: ?Gen:  Alert, NAD, pleasant ?Pulm:  Rate and effort normal ?Abd: Soft, ND, NT, +BS. Patient with 2 upper right ventral hernia's that are easily reducible with palpable defect. Inferior to this, there is also a right ventral hernia that is easily reducible with a smaller palpable defect. Abdominal binder replaced  ? ?Lab Results:  ?Recent Labs  ?  07/31/21 ?0612 08/01/21 ?0501  ?WBC 12.2* 11.3*  ?HGB 11.2* 11.0*  ?HCT 36.4* 33.3*  ?PLT 141* 134*  ? ?BMET ?Recent Labs  ?  07/31/21 ?7416 08/01/21 ?0501  ?NA 140 140  ?K 4.9 4.4  ?CL 106 106  ?CO2 20* 25  ?GLUCOSE 98 154*  ?BUN 25* 23  ?CREATININE 1.62* 1.63*  ?CALCIUM 8.4* 8.4*  ? ?PT/INR ?Recent Labs  ?  07/29/21 ?0943  ?LABPROT 14.7  ?INR 1.1  ? ?CMP  ?   ?Component Value Date/Time  ? NA 140 08/01/2021 0501  ? NA 141 11/08/2016 1100  ? K 4.4 08/01/2021 0501  ? CL 106 08/01/2021 0501  ? CO2 25 08/01/2021 0501  ? GLUCOSE 154 (H) 08/01/2021 0501  ? BUN 23 08/01/2021 0501  ? BUN 19 11/08/2016 1100  ? CREATININE 1.63 (H) 08/01/2021 0501  ? CREATININE 1.93 (H) 11/17/2018 1429  ? CALCIUM 8.4 (L) 08/01/2021 0501  ? PROT 8.0 07/29/2021 0943  ? ALBUMIN 4.5 07/29/2021 0943  ? AST 27 07/29/2021 0943  ? ALT 12 07/29/2021 0943  ? ALKPHOS 95 07/29/2021 0943  ? BILITOT 0.7 07/29/2021 0943  ? GFRNONAA  39 (L) 08/01/2021 0501  ? GFRAA 39 (L) 12/05/2017 0752  ? ?Lipase  ?   ?Component Value Date/Time  ? LIPASE 33 07/29/2021 1141  ? ? ?Studies/Results: ?DG Abd Portable 1V-Small Bowel Obstruction Protocol-initial, 8 hr delay ? ?Result Date: 07/30/2021 ?CLINICAL DATA:  Small bowel protocol.  8 hours postcontrast. EXAM: PORTABLE ABDOMEN - 1 VIEW COMPARISON:  07/30/2021 FINDINGS: Contrast material is demonstrated throughout the colon. Presence of contrast material in the colon suggest no evidence of high-grade small bowel obstruction. No significant gaseous distention of small bowel. An enteric tube is present with tip in the left upper quadrant consistent with location in the stomach. IMPRESSION: Contrast material is demonstrated throughout the colon suggesting no evidence of high-grade small bowel obstruction. Electronically Signed   By: Lucienne Capers M.D.   On: 07/30/2021 20:18   ? ?Anti-infectives: ?Anti-infectives (From admission, onward)  ? ? None  ? ?  ? ? ? ?Assessment/Plan ?Multiple ventral hernias, reducible ?SBO ?- SBO clinically and radiographically appears to be resolving. Contrast in colon on xray 3/13. He is tolerating FLD without n/v and has ROBF. His abdominal exam is benign and hernia's remain reducible. If he tolerates soft diet he can be discharged from our standpoint. Cont abdominal binder.  ?  ?FEN -  Soft, IVF per TRH ?VTE - LMWH. Okay to resume Xarelto if tolerates diet  ?ID - no current abx. WBC 11.3, afebrile.  ?Foley - external catheter present  ?  ?- below per TRH -  ?hx A. Fib on Xarelto (last dose 3/12 am) ?tachy-brady syndrome s/p pacemaker ?Dm2 ?CKD3 - Cr 1.63 ?HTN ?Chronic diastolic CHF ?Microcytic anemia  ?  ?I reviewed nursing notes, hospitalist notes, last 24 h vitals and pain scores, last 48 h intake and output, last 24 h labs and trends, and last 24 h imaging results. ? ? LOS: 3 days  ? ? ?Jillyn Ledger , PA-C ?[facility] ?08/01/2021, 8:10 AM ?Please see [facility] for  pager number during day hours 7:00am-4:30pm ? ?

## 2021-08-01 NOTE — TOC Progression Note (Addendum)
Transition of Care (TOC) - Progression Note  ? ? ?Patient Details  ?Name: [name] ?MRN: [id number] ?Date of Birth: December 25, 1923 ? ?Transition of Care (TOC) CM/SW Contact  ?[place], LCSW ?Phone Number: ?08/01/2021, 12:32 PM ? ?Clinical Narrative:    ? ?CSW received call back from patients granddaughter. Granddaughter explains she is ultimately seeking long term SNF placement for pt and pt is on a waiting list with the [facility]. CSW explained that [facility] wouldn't do longterm placement, only short term rehab. Daughter expresses that she is currently interested in having pt return home and thinks his mentation would improve in a familiar environment. She explains he gets 11 hours a week of home aid services through the [place] with [facility]. She reports Mr. Eddie Dibbles with [facility] is working with the [facility] to increase hours to 30/week. Daughter states she sometimes stays at the pts home and plans to stay with him after d/c. She reports that pt has a nurse that comes out from [facility]. She would be interested in PT/OT services as well. CSW will follow for d/c.  ? ?CSW contacted [facility] to see if pt is active with them; awaiting response.  ? ?4098: CSW notified by MD that daughter is requesting 24 hour RN. TOC would only arrange [facility] services and no long term care or personal care services. CSW called granddaughter and left voicemail requesting return call.  ? ?CSW received call from Ms. Claiborne Billings with the [facility] who is pt's Education officer, museum. Ms. Claiborne Billings clarifies pt's current situation. She states that grand daughter is requesting 24/hour care at home which the [place] does not do. She states the granddaughter would otherwise be looking for long term placement at  SNF. She states the max home care the [place] can approve is 16 hours. She explains how to apply for long term care through the hospital with [facility] checklist. CSW explains that hospital does not do long term care placement. That family would have to arrange care at home. Ms  Claiborne Billings explains that if pt has a safe discharge home, family would need to go through pt's PCP to arrange LTC SNF placement. CSW will discuss further with pt's grandaughter. ? ?1191: Called pt grandaughter again; no answer, left voicemail requesting return call.  ? ?1430: Grandaughter called CSW back. CSW explained that medicare nor VA would cover 24/7 care at home. Grandaughter then explains that she would be interested in SNF. CSW explained the hospital won't do LTC placement and that pt wouldn't qualify for SNF for rehab as he is walking 200 feet with supervision. CSW agreed to send the initial LTC SNF application to the [place] but that pt would be Dc'd today and she would have to follow up with with pt's [facility] PCP regarding LTC SNF approval and placement. GrandDaughter is agreeable to this and states she will be able to pick pt up today. She is interested in [facility] PT/OT services which CSW will attempt to arrange. Prior to finishing phone call, Grandaughter starts talking about 24/7 care at home being arranged by Mr. Eddie Dibbles. CSW explained that 24/7 care wouldn't be covered by medicare or the [place]. She seems confused regarding what care is available through insurance.  ? ?CSW called Eddie Dibbles with [facility]. Eddie Dibbles explained that he is not going to be able to arrange 24/7 care through the [place] that he explained that to the pt grandaughter this morning. He attempts to add grandaughter in 3 way call but she doesn't answer. He  states he will call CSW back when he discusses with pt's grandaughter.  ? ?Expected Discharge Plan: Home/Self Care ?  ? ?Expected Discharge Plan and Services ?Expected Discharge Plan: Home/Self Care ?  ?  ?  ?Living arrangements for the past 2 months: [place] ?                ?  ?  ?  ?  ?  ?  ?  ?  ?  ?  ? ? ?Social Determinants of Health (SDOH) Interventions ?  ? ?Readmission Risk Interventions ?No flowsheet data found. ? ?

## 2021-08-02 LAB — GLUCOSE, CAPILLARY: Glucose-Capillary: 167 mg/dL — ABNORMAL HIGH (ref 70–99)

## 2021-08-03 ENCOUNTER — Ambulatory Visit: Payer: Medicare PPO | Admitting: Nurse Practitioner

## 2021-08-03 ENCOUNTER — Ambulatory Visit (INDEPENDENT_AMBULATORY_CARE_PROVIDER_SITE_OTHER): Payer: Medicare PPO | Admitting: Nurse Practitioner

## 2021-08-03 ENCOUNTER — Other Ambulatory Visit: Payer: Self-pay

## 2021-08-03 ENCOUNTER — Encounter: Payer: Self-pay | Admitting: Nurse Practitioner

## 2021-08-03 VITALS — BP 132/78 | HR 98 | Temp 97.6°F | Ht 71.0 in | Wt 153.0 lb

## 2021-08-03 DIAGNOSIS — I482 Chronic atrial fibrillation, unspecified: Secondary | ICD-10-CM | POA: Diagnosis not present

## 2021-08-03 DIAGNOSIS — Z09 Encounter for follow-up examination after completed treatment for conditions other than malignant neoplasm: Secondary | ICD-10-CM

## 2021-08-03 DIAGNOSIS — Z125 Encounter for screening for malignant neoplasm of prostate: Secondary | ICD-10-CM | POA: Diagnosis not present

## 2021-08-03 DIAGNOSIS — K5901 Slow transit constipation: Secondary | ICD-10-CM

## 2021-08-03 DIAGNOSIS — N3001 Acute cystitis with hematuria: Secondary | ICD-10-CM

## 2021-08-03 DIAGNOSIS — R300 Dysuria: Secondary | ICD-10-CM

## 2021-08-03 LAB — POCT URINALYSIS DIPSTICK
Bilirubin, UA: NEGATIVE
Blood, UA: 200
Glucose, UA: NEGATIVE
Ketones, UA: NEGATIVE
Nitrite, UA: NEGATIVE
Protein, UA: POSITIVE — AB
Spec Grav, UA: 1.015 (ref 1.010–1.025)
Urobilinogen, UA: 1 E.U./dL
pH, UA: 7 (ref 5.0–8.0)

## 2021-08-03 MED ORDER — DOCUSATE SODIUM 100 MG PO CAPS: 100.0000 mg | ORAL_CAPSULE | Freq: Two times a day (BID) | ORAL | 0 refills | Status: DC

## 2021-08-03 MED ORDER — LOSARTAN POTASSIUM 100 MG PO TABS: 100.0000 mg | ORAL_TABLET | Freq: Every day | ORAL | 0 refills | Status: DC

## 2021-08-03 MED ORDER — POLYETHYLENE GLYCOL 3350 17 GM/SCOOP PO POWD: 17.0000 g | Freq: Two times a day (BID) | ORAL | 1 refills | Status: DC | PRN

## 2021-08-03 MED ORDER — SULFAMETHOXAZOLE-TRIMETHOPRIM 800-160 MG PO TABS: 1.0000 | ORAL_TABLET | Freq: Two times a day (BID) | ORAL | 0 refills | Status: DC

## 2021-08-03 NOTE — Assessment & Plan Note (Signed)
Chronic, stable. Continue xarelto. Losartan refilled today. ?

## 2021-08-03 NOTE — Patient Instructions (Signed)
It was great to see you! ? ?Start bactrim antibiotic twice a day with food. Make sure you are drinking plenty of fluid.  ? ?Let's follow-up in 4 week, sooner if you have concerns. ? ?If a referral was placed today, you will be contacted for an appointment. Please note that routine referrals can sometimes take up to 3-4 weeks to process. Please call our office if you haven't heard anything after this time frame. ? ?Take care, ? ?Vance Peper, NP ? ?

## 2021-08-03 NOTE — Progress Notes (Signed)
Acute Office Visit  Subjective:    Patient ID: [name], male    DOB: 12-31-1923, 88 y.o.   MRN: [id number]  Chief Complaint  Patient presents with   burning urination    Pt c/o burning while urinating    HPI Patient is in today for trouble urinating and then burning with urination when he is almost done. He denies abdominal pain, back pain, and fevers. His granddaughter states that he has been getting more confused recently and he has an appointment with neurology next week.   He was also recently admitted to [facility] from 07/29/21 - 08/01/21 for a small bowel obstruction. He has a history of ventral hernias that are easily reducible. He was treated conservatively with NG tube and IV fluids. His obstruction resolved and he was discharged. He was having confusion, especially at night while in the hospital and was started on quetiapine at bedtime. His granddaughter states that since he has been home, he refuses to take his medications at times. He states that he is an adult and can take his own medications. She states that she will make sure he gets to the neurology appointment next week. Medication reconciliation done with granddaughter.   Past Medical History:  Diagnosis Date   Arthritis    Atrial fibrillation ([name])    CKD (chronic kidney disease)    History of hiatal hernia    Hx SBO    Last 2013 all treated conservatively   Hypertension    Presence of permanent cardiac pacemaker    Prostate cancer [facility])    Stroke ([name]) 2017   left facial drooping noted 08/14/2016   Tachycardia-bradycardia syndrome ([name])    Type II diabetes mellitus ([name])    Ventral hernia    x3    Past Surgical History:  Procedure Laterality Date   ABDOMINAL AORTOGRAM W/LOWER EXTREMITY N/A 08/07/2016   Procedure: Abdominal Aortogram w/Lower Extremity;  Surgeon: Iran Ouch, MD;  Location: [facility] INVASIVE CV LAB;  Service: Cardiovascular;  Laterality: N/A;   CHOLECYSTECTOMY OPEN   03/31/2001   Dr Wiliam Ke   COLON SURGERY     EXPLORATORY LAPAROTOMY  08/2004   Hattie Perch 10/01/2010   HEMORRHOID SURGERY     HERNIA REPAIR     HIATAL HERNIA REPAIR  2002   INCISIONAL HERNIA REPAIR  08/2004   Hattie Perch 10/01/2010   INSERT / REPLACE / REMOVE PACEMAKER     PERIPHERAL VASCULAR BALLOON ANGIOPLASTY  08/07/2016   Procedure: Peripheral Vascular Balloon Angioplasty;  Surgeon: Iran Ouch, MD;  Location: [facility] INVASIVE CV LAB;  Service: Cardiovascular;;  left popliteal artery   PERIPHERAL VASCULAR INTERVENTION  08/14/2016   popliteal artery/notes 08/14/2016   PERIPHERAL VASCULAR INTERVENTION Left 08/14/2016   Procedure: Peripheral Vascular Intervention;  Surgeon: Iran Ouch, MD;  Location: [facility] INVASIVE CV LAB;  Service: Cardiovascular;  Laterality: Left;  popliteal artery   PERMANENT PACEMAKER GENERATOR CHANGE N/A 04/06/2012   Procedure: PERMANENT PACEMAKER GENERATOR CHANGE;  Surgeon: Marinus Maw, MD;  Location: [facility] CATH LAB;  Service: Cardiovascular;  Laterality: N/A;   SMALL INTESTINE SURGERY  09/16/2004   SBO resection, VH repair    Family History  Problem Relation Age of Onset   Pneumonia Father    Stroke Brother    Stroke Sister    Cancer Brother        type unknown    Social History   Socioeconomic History   Marital status: Widowed    Spouse name: Not  on file   Number of children: Not on file   Years of education: Not on file   Highest education level: Not on file  Occupational History   Not on file  Tobacco Use   Smoking status: Former    Packs/day: 1.50    Years: 15.00    Pack years: 22.50    Types: Cigarettes    Quit date: 04/16/1955    Years since quitting: 66.3   Smokeless tobacco: Former    Types: Chew    Quit date: 08/17/1952  Substance and Sexual Activity   Alcohol use: No    Comment: 08/14/2016 "drank beer when I was a teenager"   Drug use: No   Sexual activity: Not on file  Other Topics Concern   Not on file  Social History Narrative   Lives  alone   Social Determinants of Health   Financial Resource Strain: Not on file  Food Insecurity: Not on file  Transportation Needs: Not on file  Physical Activity: Not on file  Stress: Not on file  Social Connections: Not on file  Intimate Partner Violence: Not on file    Outpatient Medications Prior to Visit  Medication Sig Dispense Refill   atorvastatin (LIPITOR) 10 MG tablet Take 10 mg by mouth daily. (Patient not taking: Reported on 07/29/2021)     Cholecalciferol (VITAMIN D-3) 125 MCG (5000 UT) TABS Take 2 tablets by mouth daily. (Patient not taking: Reported on 07/29/2021)     FEROSUL 325 (65 Fe) MG tablet TAKE 1 TABLET BY MOUTH EVERY OTHER DAY (Patient not taking: Reported on 07/29/2021) 90 tablet 2   latanoprost (XALATAN) 0.005 % ophthalmic solution Place 1 drop into both eyes nightly.     melatonin 3 MG TABS tablet Take 1 tablet (3 mg total) by mouth at bedtime as needed. 30 tablet 0   metoprolol succinate (TOPROL-XL) 25 MG 24 hr tablet TAKE 1 TABLET(25 MG) BY MOUTH EVERY EVENING (Patient not taking: Reported on 07/29/2021) 90 tablet 1   niacin 500 MG tablet Take 500 mg by mouth at bedtime. (Patient not taking: Reported on 04/25/2021)     Nutritional Supplements (GLUCERNA ADVANCE SHAKE PO) Take by mouth.     pantoprazole (PROTONIX) 40 MG tablet TAKE 1 TABLET(40 MG) BY MOUTH DAILY (Patient not taking: Reported on 07/29/2021) 90 tablet 1   QUEtiapine (SEROQUEL) 25 MG tablet Take 25 mg by mouth at bedtime. (Patient not taking: Reported on 07/29/2021)     Vitamin D, Ergocalciferol, (DRISDOL) 1.25 MG (50000 UNIT) CAPS capsule Take 1 capsule (50,000 Units total) by mouth every 7 (seven) days. (Patient not taking: Reported on 07/29/2021) 15 capsule 1   XARELTO 15 MG TABS tablet TAKE 1 TABLET(15 MG) BY MOUTH DAILY WITH SUPPER (Patient taking differently: 15 mg daily with supper.) 30 tablet 3   zinc gluconate 50 MG tablet Take 50 mg by mouth daily. (Patient not taking: Reported on 04/25/2021)      docusate sodium (COLACE) 100 MG capsule TAKE 1 CAPSULE(100 MG) BY MOUTH TWICE DAILY 10 capsule 0   losartan (COZAAR) 100 MG tablet Take 100 mg by mouth daily. (Patient not taking: Reported on 07/29/2021)     mirtazapine (REMERON) 7.5 MG tablet Take 7.5 mg by mouth at bedtime. (Patient not taking: Reported on 07/29/2021)     polyethylene glycol powder (GLYCOLAX/MIRALAX) 17 GM/SCOOP powder Take 17 g by mouth 2 (two) times daily as needed. (Patient not taking: Reported on 07/29/2021) 3350 g 1   No facility-administered medications  prior to visit.    No Known Allergies  Review of Systems See pertinent positives and negatives per HPI.    Objective:    Physical Exam Vitals and nursing note reviewed.  Constitutional:      Appearance: Normal appearance.  HENT:     Head: Normocephalic.  Eyes:     Conjunctiva/sclera: Conjunctivae normal.  Cardiovascular:     Rate and Rhythm: Normal rate and regular rhythm.     Pulses: Normal pulses.     Heart sounds: Normal heart sounds.  Pulmonary:     Effort: Pulmonary effort is normal.     Breath sounds: Normal breath sounds.  Abdominal:     Palpations: Abdomen is soft.     Tenderness: There is no abdominal tenderness.     Hernia: A hernia is present.  Musculoskeletal:     Cervical back: Normal range of motion.  Skin:    General: Skin is warm.  Neurological:     General: No focal deficit present.     Mental Status: He is alert.  Psychiatric:        Mood and Affect: Mood normal.        Behavior: Behavior normal.        Thought Content: Thought content normal.        Judgment: Judgment normal.    BP 132/78 (BP Location: Left Arm, Cuff Size: Normal)   Pulse 98   Temp 97.6 F (36.4 C) (Temporal)   Ht 5\' 11"  (1.803 m)   Wt 153 lb (69.4 kg)   SpO2 100%   BMI 21.34 kg/m  Wt Readings from Last 3 Encounters:  08/03/21 153 lb (69.4 kg)  07/29/21 149 lb 14.6 oz (68 kg)  04/25/21 150 lb (68 kg)    Health Maintenance Due  Topic Date Due    Pneumonia Vaccine 36+ Years old (1 - PCV) Never done   Zoster Vaccines- Shingrix (1 of 2) Never done    There are no preventive care reminders to display for this patient.   Lab Results  Component Value Date   TSH 2.80 03/21/2020   Lab Results  Component Value Date   WBC 11.3 (H) 08/01/2021   HGB 11.0 (L) 08/01/2021   HCT 33.3 (L) 08/01/2021   MCV 78.0 (L) 08/01/2021   PLT 134 (L) 08/01/2021   Lab Results  Component Value Date   NA 140 08/01/2021   K 4.4 08/01/2021   CO2 25 08/01/2021   GLUCOSE 154 (H) 08/01/2021   BUN 23 08/01/2021   CREATININE 1.63 (H) 08/01/2021   BILITOT 0.7 07/29/2021   ALKPHOS 95 07/29/2021   AST 27 07/29/2021   ALT 12 07/29/2021   PROT 8.0 07/29/2021   ALBUMIN 4.5 07/29/2021   CALCIUM 8.4 (L) 08/01/2021   ANIONGAP 9 08/01/2021   GFR 38.50 (L) 04/25/2021   Lab Results  Component Value Date   CHOL 122 07/23/2015   Lab Results  Component Value Date   HDL 43 07/23/2015   Lab Results  Component Value Date   LDLCALC 68 07/23/2015   Lab Results  Component Value Date   TRIG 55 07/23/2015   Lab Results  Component Value Date   CHOLHDL 2.8 07/23/2015   Lab Results  Component Value Date   HGBA1C 7.7 (H) 04/25/2021       Assessment & Plan:   Problem List Items Addressed This Visit       Cardiovascular and Mediastinum   Atrial fibrillation ([name])    Chronic,  stable. Continue xarelto. Losartan refilled today.      Relevant Medications   losartan (COZAAR) 100 MG tablet   Other Relevant Orders   Basic Metabolic Panel (BMET)   CBC w/Diff     Digestive   Slow transit constipation    Recent hospitalization for small bowel obstruction. Miralax refilled to use as needed for constipation. Hospital notes reviewed. He was seen by surgery who is treated conservatively with NG tube and IV fluids. His symptoms resolved and has been having bowel movements since bing discharged. He is wearing an abdominal binder to help with hernia.        Relevant Medications   docusate sodium (COLACE) 100 MG capsule   polyethylene glycol powder (GLYCOLAX/MIRALAX) 17 GM/SCOOP powder   Other Visit Diagnoses     Acute cystitis with hematuria    -  Primary   Treat with bactrim 1 tablet BID x7 days. Send for culture. With difficulty urinating, will check PSA. Encourage fluids. F/U if not improving.    Dysuria       U/A positive for leukocytes and blood. Will treat for UTI   Relevant Orders   Urine Culture   POCT Urinalysis Dipstick (Completed)   PSA   Hospital discharge follow-up       Recently hospitalized for small bowel obstruction which has resolved. Med reconciliation completed and meds reordered that were needed.         Meds ordered this encounter  Medications   docusate sodium (COLACE) 100 MG capsule    Sig: Take 1 capsule (100 mg total) by mouth 2 (two) times daily.    Dispense:  180 capsule    Refill:  0   losartan (COZAAR) 100 MG tablet    Sig: Take 1 tablet (100 mg total) by mouth daily.    Dispense:  90 tablet    Refill:  0   polyethylene glycol powder (GLYCOLAX/MIRALAX) 17 GM/SCOOP powder    Sig: Take 17 g by mouth 2 (two) times daily as needed.    Dispense:  3350 g    Refill:  1   sulfamethoxazole-trimethoprim (BACTRIM DS) 800-160 MG tablet    Sig: Take 1 tablet by mouth 2 (two) times daily.    Dispense:  14 tablet    Refill:  0     Gerre Scull, NP

## 2021-08-03 NOTE — Assessment & Plan Note (Signed)
Recent hospitalization for small bowel obstruction. Miralax refilled to use as needed for constipation. Hospital notes reviewed. He was seen by surgery who is treated conservatively with NG tube and IV fluids. His symptoms resolved and has been having bowel movements since bing discharged. He is wearing an abdominal binder to help with hernia.  ?

## 2021-08-04 LAB — BASIC METABOLIC PANEL
BUN/Creatinine Ratio: 13 (calc) (ref 6–22)
BUN: 20 mg/dL (ref 7–25)
CO2: 24 mmol/L (ref 20–32)
Calcium: 8.2 mg/dL — ABNORMAL LOW (ref 8.6–10.3)
Chloride: 104 mmol/L (ref 98–110)
Creat: 1.5 mg/dL — ABNORMAL HIGH (ref 0.70–1.22)
Glucose, Bld: 144 mg/dL — ABNORMAL HIGH (ref 65–99)
Potassium: 4.5 mmol/L (ref 3.5–5.3)
Sodium: 138 mmol/L (ref 135–146)

## 2021-08-04 LAB — CBC WITH DIFFERENTIAL/PLATELET
Absolute Monocytes: 851 cells/uL (ref 200–950)
Basophils Absolute: 32 cells/uL (ref 0–200)
Basophils Relative: 0.4 %
Eosinophils Absolute: 113 cells/uL (ref 15–500)
Eosinophils Relative: 1.4 %
HCT: 32.8 % — ABNORMAL LOW (ref 38.5–50.0)
Hemoglobin: 10.7 g/dL — ABNORMAL LOW (ref 13.2–17.1)
Lymphs Abs: 1337 cells/uL (ref 850–3900)
MCH: 25.1 pg — ABNORMAL LOW (ref 27.0–33.0)
MCHC: 32.6 g/dL (ref 32.0–36.0)
MCV: 76.8 fL — ABNORMAL LOW (ref 80.0–100.0)
Monocytes Relative: 10.5 %
Neutro Abs: 5767 cells/uL (ref 1500–7800)
Neutrophils Relative %: 71.2 %
Platelets: 141 10*3/uL (ref 140–400)
RBC: 4.27 10*6/uL (ref 4.20–5.80)
RDW: 15.7 % — ABNORMAL HIGH (ref 11.0–15.0)
Total Lymphocyte: 16.5 %
WBC: 8.1 10*3/uL (ref 3.8–10.8)

## 2021-08-04 LAB — PSA: PSA: 5.94 ng/mL — ABNORMAL HIGH (ref <=4.00)

## 2021-08-06 LAB — URINE CULTURE
MICRO NUMBER:: 13145910
SPECIMEN QUALITY:: ADEQUATE

## 2021-08-06 NOTE — Telephone Encounter (Signed)
Refill request

## 2021-08-07 ENCOUNTER — Telehealth: Payer: Self-pay | Admitting: Family Medicine

## 2021-08-07 ENCOUNTER — Telehealth: Payer: Self-pay | Admitting: Neurology

## 2021-08-07 NOTE — Telephone Encounter (Signed)
Patient's grand daughter called in and asked for me to let Dr. Leonie Man know that the patient was in the hospital and just got discharged. She wanted to make sure Dr. 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 She said he started to get violent at night time and they suggested he goes to live in a nursing home but she wanted to keep him until after this appointment. ?

## 2021-08-07 NOTE — Telephone Encounter (Signed)
Verbal given 

## 2021-08-07 NOTE — Telephone Encounter (Signed)
[name] from [facility] is calling needing a verbal order for     PT  start of care on 08/06/21. Please advise [name] at [phone] ?

## 2021-08-08 ENCOUNTER — Encounter: Payer: Self-pay | Admitting: Family Medicine

## 2021-08-08 ENCOUNTER — Encounter: Payer: Self-pay | Admitting: Neurology

## 2021-08-08 ENCOUNTER — Ambulatory Visit (INDEPENDENT_AMBULATORY_CARE_PROVIDER_SITE_OTHER): Payer: Medicare PPO | Admitting: Family Medicine

## 2021-08-08 ENCOUNTER — Ambulatory Visit (INDEPENDENT_AMBULATORY_CARE_PROVIDER_SITE_OTHER): Payer: Medicare PPO | Admitting: Neurology

## 2021-08-08 VITALS — BP 115/73 | HR 109 | Ht 71.0 in | Wt 148.0 lb

## 2021-08-08 VITALS — BP 115/68 | HR 101 | Temp 97.3°F | Ht 71.0 in | Wt 148.4 lb

## 2021-08-08 DIAGNOSIS — Z8673 Personal history of transient ischemic attack (TIA), and cerebral infarction without residual deficits: Secondary | ICD-10-CM

## 2021-08-08 DIAGNOSIS — G301 Alzheimer's disease with late onset: Secondary | ICD-10-CM | POA: Diagnosis not present

## 2021-08-08 DIAGNOSIS — F02A11 Dementia in other diseases classified elsewhere, mild, with agitation: Secondary | ICD-10-CM

## 2021-08-08 DIAGNOSIS — Z113 Encounter for screening for infections with a predominantly sexual mode of transmission: Secondary | ICD-10-CM | POA: Diagnosis not present

## 2021-08-08 DIAGNOSIS — Z09 Encounter for follow-up examination after completed treatment for conditions other than malignant neoplasm: Secondary | ICD-10-CM

## 2021-08-08 MED ORDER — MEMANTINE HCL 10 MG PO TABS: 10.0000 mg | ORAL_TABLET | Freq: Two times a day (BID) | ORAL | 3 refills | Status: DC

## 2021-08-08 MED ORDER — MEMANTINE HCL 28 X 5 MG & 21 X 10 MG PO TABS: ORAL_TABLET | ORAL | 12 refills | Status: DC

## 2021-08-08 NOTE — Progress Notes (Signed)
? ?Established Patient Office Visit ? ?Subjective:  ?Patient ID: [name], male    DOB: 24-May-1923  Age: 88 y.o. MRN: [id number] ? ?CC:  ?Chief Complaint  ?Patient presents with  ? Hospitalization Follow-up  ?  Hospital follow up, patient refusing to take medications sometimes.   ? ? ?HPI ?[name] presents for follow-up with his daughter [name] and son-in-law.  She is moved in with him.  By enlarge he is doing okay.  He does not always take all of his medicines.  Seroquel seems to be helping for nighttime use. ? ?Past Medical History:  ?Diagnosis Date  ? Arthritis   ? Atrial fibrillation ([place])   ? CKD (chronic kidney disease)   ? History of hiatal hernia   ? Hx SBO   ? Last 2013 all treated conservatively  ? Hypertension   ? Presence of permanent cardiac pacemaker   ? Prostate cancer ([place])   ? Stroke [facility]) 2017  ? left facial drooping noted 08/14/2016  ? Tachycardia-bradycardia syndrome ([place])   ? Type II diabetes mellitus ([place])   ? Ventral hernia   ? x3  ? ? ?Past Surgical History:  ?Procedure Laterality Date  ? ABDOMINAL AORTOGRAM W/LOWER EXTREMITY N/A 08/07/2016  ? Procedure: Abdominal Aortogram w/Lower Extremity;  Surgeon: Wellington Hampshire, MD;  Location: [facility];  Service: Cardiovascular;  Laterality: N/A;  ? CHOLECYSTECTOMY OPEN  03/31/2001  ? Dr Lindon Romp  ? COLON SURGERY    ? EXPLORATORY LAPAROTOMY  08/2004  ? Archie Endo 10/01/2010  ? HEMORRHOID SURGERY    ? HERNIA REPAIR    ? HIATAL HERNIA REPAIR  2002  ? INCISIONAL HERNIA REPAIR  08/2004  ? Archie Endo 10/01/2010  ? INSERT / REPLACE / REMOVE PACEMAKER    ? PERIPHERAL VASCULAR BALLOON ANGIOPLASTY  08/07/2016  ? Procedure: Peripheral Vascular Balloon Angioplasty;  Surgeon: Wellington Hampshire, MD;  Location: [facility];  Service: Cardiovascular;;  left popliteal artery  ? PERIPHERAL VASCULAR INTERVENTION  08/14/2016  ? popliteal artery/notes 08/14/2016  ? PERIPHERAL VASCULAR INTERVENTION Left 08/14/2016  ? Procedure: Peripheral Vascular  Intervention;  Surgeon: Wellington Hampshire, MD;  Location: [facility];  Service: Cardiovascular;  Laterality: Left;  popliteal artery  ? PERMANENT PACEMAKER GENERATOR CHANGE N/A 04/06/2012  ? Procedure: PERMANENT PACEMAKER GENERATOR CHANGE;  Surgeon: Evans Lance, MD;  Location: [facility];  Service: Cardiovascular;  Laterality: N/A;  ? SMALL INTESTINE SURGERY  09/16/2004  ? SBO resection, VH repair  ? ? ?Family History  ?Problem Relation Age of Onset  ? Pneumonia Father   ? Stroke Brother   ? Stroke Sister   ? Cancer Brother   ?     type unknown  ? ? ?Social History  ? ?Socioeconomic History  ? Marital status: Widowed  ?  Spouse name: Not on file  ? Number of children: Not on file  ? Years of education: Not on file  ? Highest education level: Not on file  ?Occupational History  ? Not on file  ?Tobacco Use  ? Smoking status: Former  ?  Packs/day: 1.50  ?  Years: 15.00  ?  Pack years: 22.50  ?  Types: Cigarettes  ?  Quit date: 04/16/1955  ?  Years since quitting: 66.3  ? Smokeless tobacco: Former  ?  Types: Chew  ?  Quit date: 08/17/1952  ?Substance and Sexual Activity  ? Alcohol use: No  ?  Comment: 08/14/2016 "drank beer when I  was a teenager"  ? Drug use: No  ? Sexual activity: Not on file  ?Other Topics Concern  ? Not on file  ?Social History Narrative  ? Lives alone  ? ?Social Determinants of Health  ? ?Financial Resource Strain: Not on file  ?Food Insecurity: Not on file  ?Transportation Needs: Not on file  ?Physical Activity: Not on file  ?Stress: Not on file  ?Social Connections: Not on file  ?Intimate Partner Violence: Not on file  ? ? ?Outpatient Medications Prior to Visit  ?Medication Sig Dispense Refill  ? docusate sodium (COLACE) 100 MG capsule Take 1 capsule (100 mg total) by mouth 2 (two) times daily. 180 capsule 0  ? latanoprost (XALATAN) 0.005 % ophthalmic solution Place 1 drop into both eyes nightly.    ? melatonin 3 MG TABS tablet Take 1 tablet (3 mg total) by mouth at bedtime as needed. 30  tablet 0  ? metoprolol succinate (TOPROL-XL) 25 MG 24 hr tablet TAKE 1 TABLET(25 MG) BY MOUTH EVERY EVENING 90 tablet 1  ? Nutritional Supplements (GLUCERNA ADVANCE SHAKE PO) Take by mouth.    ? pantoprazole (PROTONIX) 40 MG tablet TAKE 1 TABLET(40 MG) BY MOUTH DAILY 90 tablet 1  ? QUEtiapine (SEROQUEL) 25 MG tablet Take 25 mg by mouth at bedtime.    ? sulfamethoxazole-trimethoprim (BACTRIM DS) 800-160 MG tablet Take 1 tablet by mouth 2 (two) times daily. 14 tablet 0  ? XARELTO 15 MG TABS tablet TAKE 1 TABLET(15 MG) BY MOUTH DAILY WITH SUPPER (Patient taking differently: 15 mg daily with supper.) 30 tablet 3  ? atorvastatin (LIPITOR) 10 MG tablet Take 10 mg by mouth daily.    ? FEROSUL 325 (65 Fe) MG tablet TAKE 1 TABLET BY MOUTH EVERY OTHER DAY (Patient not taking: Reported on 08/08/2021) 90 tablet 2  ? losartan (COZAAR) 100 MG tablet Take 1 tablet (100 mg total) by mouth daily. (Patient not taking: Reported on 08/08/2021) 90 tablet 0  ? polyethylene glycol powder (GLYCOLAX/MIRALAX) 17 GM/SCOOP powder Take 17 g by mouth 2 (two) times daily as needed. (Patient not taking: Reported on 08/08/2021) 3350 g 1  ? Cholecalciferol (VITAMIN D-3) 125 MCG (5000 UT) TABS Take 2 tablets by mouth daily. (Patient not taking: Reported on 08/08/2021)    ? memantine (NAMENDA TITRATION PAK) tablet pack 5 mg/day for =1 week; 5 mg twice daily for =1 week; 15 mg/day given in 5 mg and 10 mg separated doses for =1 week; then 10 mg twice daily (Patient not taking: Reported on 08/08/2021) 49 tablet 12  ? memantine (NAMENDA) 10 MG tablet Take 1 tablet (10 mg total) by mouth 2 (two) times daily. Start only after finishing titration pack first (Patient not taking: Reported on 08/08/2021) 60 tablet 3  ? niacin 500 MG tablet Take 500 mg by mouth at bedtime.    ? Vitamin D, Ergocalciferol, (DRISDOL) 1.25 MG (50000 UNIT) CAPS capsule Take 1 capsule (50,000 Units total) by mouth every 7 (seven) days. (Patient not taking: Reported on 08/08/2021) 15 capsule  1  ? zinc gluconate 50 MG tablet Take 50 mg by mouth daily. (Patient not taking: Reported on 08/08/2021)    ? ?No facility-administered medications prior to visit.  ? ? ?No Known Allergies ? ?ROS ?Review of Systems  ?Constitutional:  Negative for chills, diaphoresis, fatigue, fever and unexpected weight change.  ?Respiratory: Negative.    ?Cardiovascular: Negative.   ?Gastrointestinal: Negative.   ?Musculoskeletal:  Positive for gait problem.  ?Neurological:  Negative for  speech difficulty and weakness.  ? ?  ?Objective:  ?  ?Physical Exam ?Vitals and nursing note reviewed.  ?Constitutional:   ?   Appearance: Normal appearance.  ?HENT:  ?   Right Ear: External ear normal.  ?   Left Ear: External ear normal.  ?Eyes:  ?   General: No scleral icterus.    ?   Right eye: No discharge.     ?   Left eye: No discharge.  ?   Conjunctiva/sclera: Conjunctivae normal.  ?Pulmonary:  ?   Effort: Pulmonary effort is normal.  ?Neurological:  ?   Mental Status: He is alert. Mental status is at baseline.  ?Psychiatric:     ?   Mood and Affect: Mood normal.     ?   Behavior: Behavior normal.  ? ? ?BP 115/68 (BP Location: Left Arm, Patient Position: Sitting, Cuff Size: Large)   Pulse (!) 101   Temp (!) 97.3 ?F (36.3 ?C) (Temporal)   Ht '5\' 11"'$  (1.803 m)   Wt 148 lb 6.4 oz (67.3 kg)   SpO2 97%   BMI 20.70 kg/m?  ?Wt Readings from Last 3 Encounters:  ?08/08/21 148 lb 6.4 oz (67.3 kg)  ?08/08/21 148 lb (67.1 kg)  ?08/03/21 153 lb (69.4 kg)  ? ? ? ?There are no preventive care reminders to display for this patient. ? ?There are no preventive care reminders to display for this patient. ? ?Lab Results  ?Component Value Date  ? TSH 3.350 08/08/2021  ? ?Lab Results  ?Component Value Date  ? WBC 8.1 08/03/2021  ? HGB 10.7 (L) 08/03/2021  ? HCT 32.8 (L) 08/03/2021  ? MCV 76.8 (L) 08/03/2021  ? PLT 141 08/03/2021  ? ?Lab Results  ?Component Value Date  ? NA 138 08/03/2021  ? K 4.5 08/03/2021  ? CO2 24 08/03/2021  ? GLUCOSE 144 (H) 08/03/2021   ? BUN 20 08/03/2021  ? CREATININE 1.50 (H) 08/03/2021  ? BILITOT 0.7 07/29/2021  ? ALKPHOS 95 07/29/2021  ? AST 27 07/29/2021  ? ALT 12 07/29/2021  ? PROT 8.0 07/29/2021  ? ALBUMIN 4.5 07/29/2021  ? CALCIUM 8.2 (L)

## 2021-08-08 NOTE — Progress Notes (Addendum)
[facility] [address] [place]. [place] [address] [phone]       OFFICE CONSULT NOTE  [name] Date of Birth:  [birth year] Medical Record Number:  [id number]   Referring MD: [name]  Reason for Referral: Memory loss  HPI: [name] is a 89 year old African-American male seen today for office consultation visit for memory loss.  He is accompanied by his granddaughter and grandson.  History is obtained from them and review of electronic medical records and opossum reviewed pertinent available imaging films in PACS.  He has past medical history of atrial fibrillation, chronic kidney disease, hypertension, pacemaker, right MCA infarct in March 2017 from which she recovered quite well with no permanent deficits.  His daughter informs me that they have noticed cognitive decline gradually over the last year or so.  Recently was hospitalized with small bowel obstruction on 07/29/2021 and during hospitalization he had a lot of sundowning and agitated behavior and not following doctors orders.  He was felt to likely have dementia and advised to follow-up with me for further evaluation and treatment for the same.  The patient is stubborn is often refusing his medications.  He refused to follow instructions.  The family has noticed significant cognitive decline as well as some intermittent urinary and stool incontinence at times as well.  Granddaughter has had to move in and live with the patient and he was independent prior to this.  He remains on Xarelto for his chronic A-fib.  His blood pressure is well controlled today it is 115/73.  He is tolerating Lipitor without muscle aches and pains.  He has not had a formal evaluation for dementia and has not tried medications like Aricept or Namenda.  CT scan of the head on 05/24/2020 showed no acute abnormality and stable generalized cerebral atrophy and changes of small vessel disease.  Vitamin B12 level on 04/25/2021  was low normal at 260.  TSH was normal at 2.8 on 03/21/2020. ROS:   14 system review of systems is positive for memory loss, sundowning, agitation, confusion, disorientation stubborn behavior, incontinence occasionally of bowel and bladder all other systems negative  PMH:  Past Medical History:  Diagnosis Date   Arthritis    Atrial fibrillation ([place])    CKD (chronic kidney disease)    History of hiatal hernia    Hx SBO    Last 2013 all treated conservatively   Hypertension    Presence of permanent cardiac pacemaker    Prostate cancer ([place])    Stroke ([place]) 2017   left facial drooping noted 08/14/2016   Tachycardia-bradycardia syndrome ([place])    Type II diabetes mellitus ([place])    Ventral hernia    x3    Social History:  Social History   Socioeconomic History   Marital status: Widowed    Spouse name: Not on file   Number of children: Not on file   Years of education: Not on file   Highest education level: Not on file  Occupational History   Not on file  Tobacco Use   Smoking status: Former    Packs/day: 1.50    Years: 15.00    Pack years: 22.50    Types: Cigarettes    Quit date: 04/16/1955    Years since quitting: 66.3   Smokeless tobacco: Former    Types: Chew    Quit date: 08/17/1952  Substance and Sexual Activity   Alcohol use: No    Comment: 08/14/2016 "drank beer when  I was a teenager"   Drug use: No   Sexual activity: Not on file  Other Topics Concern   Not on file  Social History Narrative   Lives alone   Social Determinants of Health   Financial Resource Strain: Not on file  Food Insecurity: Not on file  Transportation Needs: Not on file  Physical Activity: Not on file  Stress: Not on file  Social Connections: Not on file  Intimate Partner Violence: Not on file    Medications:   Current Outpatient Medications on File Prior to Visit  Medication Sig Dispense Refill   atorvastatin (LIPITOR) 10 MG tablet Take 10 mg by mouth daily.     Cholecalciferol  (VITAMIN D-3) 125 MCG (5000 UT) TABS Take 2 tablets by mouth daily.     docusate sodium (COLACE) 100 MG capsule Take 1 capsule (100 mg total) by mouth 2 (two) times daily. 180 capsule 0   FEROSUL 325 (65 Fe) MG tablet TAKE 1 TABLET BY MOUTH EVERY OTHER DAY 90 tablet 2   latanoprost (XALATAN) 0.005 % ophthalmic solution Place 1 drop into both eyes nightly.     losartan (COZAAR) 100 MG tablet Take 1 tablet (100 mg total) by mouth daily. 90 tablet 0   melatonin 3 MG TABS tablet Take 1 tablet (3 mg total) by mouth at bedtime as needed. 30 tablet 0   metoprolol succinate (TOPROL-XL) 25 MG 24 hr tablet TAKE 1 TABLET(25 MG) BY MOUTH EVERY EVENING 90 tablet 1   niacin 500 MG tablet Take 500 mg by mouth at bedtime.     Nutritional Supplements (GLUCERNA ADVANCE SHAKE PO) Take by mouth.     pantoprazole (PROTONIX) 40 MG tablet TAKE 1 TABLET(40 MG) BY MOUTH DAILY 90 tablet 1   polyethylene glycol powder (GLYCOLAX/MIRALAX) 17 GM/SCOOP powder Take 17 g by mouth 2 (two) times daily as needed. 3350 g 1   QUEtiapine (SEROQUEL) 25 MG tablet Take 25 mg by mouth at bedtime.     sulfamethoxazole-trimethoprim (BACTRIM DS) 800-160 MG tablet Take 1 tablet by mouth 2 (two) times daily. 14 tablet 0   Vitamin D, Ergocalciferol, (DRISDOL) 1.25 MG (50000 UNIT) CAPS capsule Take 1 capsule (50,000 Units total) by mouth every 7 (seven) days. 15 capsule 1   XARELTO 15 MG TABS tablet TAKE 1 TABLET(15 MG) BY MOUTH DAILY WITH SUPPER (Patient taking differently: 15 mg daily with supper.) 30 tablet 3   zinc gluconate 50 MG tablet Take 50 mg by mouth daily.     No current facility-administered medications on file prior to visit.    Allergies:  No Known Allergies  Physical Exam General: Frail elderly African-American male., seated, in no evident distress Head: head normocephalic and atraumatic.   Neck: supple with no carotid or supraclavicular bruits Cardiovascular: regular rate and rhythm, no murmurs Musculoskeletal: Mild  kyphoscoliosis. Skin:  no rash/petichiae pacemaker left infraclavicular region. Vascular:  Normal pulses all extremities  Neurologic Exam Mental Status: Awake and fully alert. Oriented to place and time. Recent and remote memory intact. Attention span, concentration and fund of knowledge appropriate. Mood and affect appropriate.  Mini-Mental status exam scored 19/30 with deficits in recall, repetition, writing and drawing.  Unable to copy intersecting pentagons.  Clock drawing 3/4.  Able to name 10 animals that can walk on 4 legs.  Palmomental reflex present bilaterally. Cranial Nerves: Fundoscopic exam reveals sharp disc margins. Pupils equal, briskly reactive to light. Extraocular movements full without nystagmus. Visual fields full to confrontation. Hearing significantly  diminished bilaterally.. Facial sensation intact. Face, tongue, palate moves normally and symmetrically.  Motor: Normal bulk and tone. Normal strength in all tested extremity muscles. Sensory.: intact to touch , pinprick , position and vibratory sensation.  Unsteady while standing on either foot unsupported and on a narrow base. Coordination: Rapid alternating movements normal in all extremities. Finger-to-nose and heel-to-shin performed accurately bilaterally. Gait and Station: Arises from chair without difficulty. Stance is slightly stooped.. Gait demonstrates normal stride length and balance . Able to heel, toe and tandem walk with mild difficulty.  Reflexes: 1+ and symmetric. Toes downgoing.          08/08/2021    9:20 AM  MMSE - Mini Mental State Exam  Orientation to time 4  Orientation to Place 5  Registration 3  Attention/ Calculation 0  Recall 0  Language- name 2 objects 2  Language- repeat 0  Language- follow 3 step command 3  Language- read & follow direction 1  Write a sentence 1  Copy design 0  Total score 19      ASSESSMENT: 88 year old African-American male with recent worsening cognition and memory  loss and sundowning likely due to Alzheimer's dementia. History of right MCA infarct in March 2017 secondary to A-fib from which she recovered quite well with practically no lasting deficits.    PLAN:I had a long discussion with the patient and his grand son and granddaughter regarding his subacute cognitive worsening and recent sundowning during hospitalization likely related to mild Alzheimer's dementia.  Recommend further evaluation by checking memory panel labs, EEG trial of Namenda starter pack to be increased as tolerated.  I recommend patient not drive due to his cognitive and vision difficulties.  If patient is unable to manage at home he may need to move to a nursing facility.  Family  voiced understanding.  Patient is not mentally competent due to his dementia and needs constant supervision and cannot live independently at the present time.  Return for follow-up in 3 months or call earlier if necessary.  Greater than 50% time during this 45-minute visit was spent on counseling and coordination of care about his memory loss and cognitive impairment and dementia and planning evaluation and treatment and answering questions  Antony Contras, MD Note: This document was prepared with digital dictation and possible smart phrase technology. Any transcriptional errors that result from this process are unintentional.

## 2021-08-08 NOTE — Patient Instructions (Signed)
I had a long discussion with the patient and his grand son and granddaughter regarding his subacute cognitive worsening and recent sundowning during hospitalization likely related to mild Alzheimer's dementia.  Recommend further evaluation by checking memory panel labs, EEG trial of Namenda starter pack to be increased as tolerated.  I recommend patient not drive due to his cognitive and vision difficulties.  If patient is unable to manage at home he may need to move to a nursing facility.  Family  voiced understanding.  Return for follow-up in 3 months or call earlier if necessary. ? ? ?Alzheimer's Disease Caregiver Guide ?Alzheimer's disease is a condition that makes a person: ?Forget things. ?Act differently. ?Have trouble paying attention and doing simple tasks. ?These things get worse with time. The tips below can help you care for the person. ?How to help manage lifestyle changes ?Tips to help with symptoms ?Be calm and patient. ?Give simple, short answers to questions. ?Avoid correcting the person in a negative way. ?Try not to take things personally, even if the person forgets your name. ?Do not argue with the person. This may make the person more upset. ?Tips to lessen frustration ?Make appointments and do daily tasks when the person is at his or her best. ?Take your time. Simple tasks may take longer. Allow plenty of time to complete tasks. ?Limit choices for the person. ?Involve the person in what you are doing. ?Keep things organized: ?Keep a daily routine. ?Organize medicines in a pillbox for each day of the week. ?Keep a calendar in a central location to remind the person of meetings or other activities. ?Avoid new or crowded places, if possible. ?Use simple words, short sentences, and a calm voice. Only give one direction at a time. ?Buy clothes and shoes that are easy to put on and take off. ?Try to change the subject if the person becomes frustrated or angry. ?Tips to prevent injury ? ?Keep floors  clear. Remove rugs, magazine racks, and floor lamps. ?Keep hallways well-lit. ?Put a handrail and non-slip mat in the bathtub or shower. ?Put childproof locks on cabinets that have dangerous items in them. These items include medicine, alcohol, guns, toxic cleaning items, sharp tools, matches, and lighters. ?Put locks on doors where the person cannot see or reach them. This helps keep the person from going out of the house and getting lost. ?Be ready for emergencies. Keep a list of emergency phone numbers and addresses close by. ?Remove car keys and lock garage doors so that the person does not try to drive. ?Bracelets may be worn that track location and identify the person as having memory problems. This should be worn at all times for safety. ?Tips for the future ? ?Discuss financial and legal planning early. People with this disease have trouble managing their money as the disease gets worse. Get help from a professional. ?Talk about advance directives, safety, and daily care. Take these steps: ?Create a living will and choose a power of attorney. This is someone who can make decisions for the person with Alzheimer's disease when he or she can no longer do so. ?Discuss driving safety and when to stop driving. The person's doctor can help with this. ?If the person lives alone, make sure he or she is safe. Some people need extra help at home. Other people need more care at a nursing home or care center. ?How to recognize changes in the person's condition ?With this disease, memory problems and confusion slowly get worse. In time,  the person may not know his or her friends and family members. ?The disease can also cause changes in behavior and mood, such as anxiety or anger. The person may see, hear, taste, smell, or feel things that are not real (hallucinate). These changes can come on all of a sudden. They may happen in response to something such as: ?Pain. ?An infection. ?Changes in temperature or noise. ?Too  much stimulation. ?Feeling lost or scared. ?Medicines. ?Where to find support ?Find out about services that can provide short-term care (respite care). These can allow you to take a break when you need it. ?Join a support group near you. These groups can help you: ?Learn ways to manage stress. ?Share experiences with others. ?Get emotional comfort and support. ?Learn about caregiving as the disease gets worse. ?Know what community resources are available. ?Where to find more information ?Alzheimer's Association: [employer] ?Contact a doctor if: ?The person has a fever. ?The person has a sudden behavior change that does not get better with calming strategies. ?The person is not able to take care of himself or herself at home. ?You are no longer able to care for the person. ?Get help right away if: ?The person has a sudden increase in confusion or new hallucinations. ?The person threatens you or anyone else, including himself or herself. ?Get help right away if you feel like your loved one may hurt himself or herself or others, or has thoughts about taking his or her own life. Go to your nearest emergency room or: ?Call your local emergency services (911 in the [place] ?Call the [place] at (505)154-7097 or 988 in the [place] This is open 24 hours a day. ?Text the Crisis Text Line at 757-113-6344. ?Summary ?Alzheimer's disease causes a person to forget things. ?A person who has this condition may have trouble doing simple tasks. ?Take steps to keep the person from getting hurt. Plan for future care. ?You can find support by joining a support group near you. ?This information is not intended to replace advice given to you by your health care provider. Make sure you discuss any questions you have with your health care provider. ?Document Revised: 11/29/2020 Document Reviewed: 08/23/2019 ?Elsevier Patient Education ? [place]. ? ?

## 2021-08-09 ENCOUNTER — Encounter: Payer: Self-pay | Admitting: Family Medicine

## 2021-08-09 ENCOUNTER — Telehealth: Payer: Self-pay | Admitting: Neurology

## 2021-08-09 ENCOUNTER — Other Ambulatory Visit: Payer: Self-pay | Admitting: Neurology

## 2021-08-09 LAB — DEMENTIA PANEL
Homocysteine: 32.1 umol/L — ABNORMAL HIGH (ref 0.0–21.3)
RPR Ser Ql: NONREACTIVE
TSH: 3.35 u[IU]/mL (ref 0.450–4.500)
Vitamin B-12: 583 pg/mL (ref 232–1245)

## 2021-08-09 MED ORDER — FOLIC ACID 1 MG PO TABS: 1.0000 mg | ORAL_TABLET | Freq: Every day | ORAL | 3 refills | Status: DC

## 2021-08-09 NOTE — Progress Notes (Signed)
Kindly inform patient that lab work for reversible causes of memory loss was normal except for increased homocysteine. Incase she smokes she need to quit and start Folic acid 1 mg daily to bring this down and repeat lab work in 2-3 months.

## 2021-08-09 NOTE — Telephone Encounter (Signed)
I returned the call to the patient's granddaughter and left a detailed message (ok per DPR). ? ?They may request a copy of the patient's office visit which includes a thorough assessment of his current health states. ? ?I also let her know some of his labs are still pending. Once the results are available and reviewed by Dr. Leonie Man, we will contact them with the findings.  ?

## 2021-08-09 NOTE — Telephone Encounter (Signed)
Pt's granddaughter, Edrick Kins (on DPR) [facility] in the process placing him in a facility. Asking if Dr. Leonie Man can enter diagnosis that pt is incompetent. If he can do that I can print from [facility].  ?Want to know if blood work was taken for dementia.  ?

## 2021-08-13 ENCOUNTER — Telehealth: Payer: Self-pay

## 2021-08-13 NOTE — Telephone Encounter (Signed)
The patient granddaughter called to get help sending a transmission for the patient. Transmission received. ?

## 2021-08-14 ENCOUNTER — Ambulatory Visit (INDEPENDENT_AMBULATORY_CARE_PROVIDER_SITE_OTHER): Payer: Medicare PPO

## 2021-08-14 DIAGNOSIS — I639 Cerebral infarction, unspecified: Secondary | ICD-10-CM | POA: Diagnosis not present

## 2021-08-14 LAB — CUP PACEART REMOTE DEVICE CHECK
Battery Impedance: 5649 Ohm
Battery Remaining Longevity: 7 mo
Battery Voltage: 2.65 V
Brady Statistic RV Percent Paced: 13 %
Date Time Interrogation Session: 20230327164324
Implantable Lead Implant Date: 20060510
Implantable Lead Location: 753860
Implantable Lead Model: 5076
Implantable Pulse Generator Implant Date: 20131118
Lead Channel Impedance Value: 0 Ohm
Lead Channel Impedance Value: 478 Ohm
Lead Channel Pacing Threshold Amplitude: 0.625 V
Lead Channel Pacing Threshold Pulse Width: 0.4 ms
Lead Channel Setting Pacing Amplitude: 2.5 V
Lead Channel Setting Pacing Pulse Width: 0.4 ms
Lead Channel Setting Sensing Sensitivity: 5.6 mV

## 2021-08-27 NOTE — Progress Notes (Signed)
Remote pacemaker transmission.   

## 2021-09-06 ENCOUNTER — Telehealth: Payer: Self-pay | Admitting: Family Medicine

## 2021-09-06 NOTE — Telephone Encounter (Signed)
Ketura(grand  ?

## 2021-09-06 NOTE — Telephone Encounter (Signed)
Ketura(granddaughter) is wanting a copy of pt's vaccination record. Please add to pt's mychart if possible. Ketura's # ? [phone]   ? ?

## 2021-09-07 NOTE — Telephone Encounter (Signed)
Vaccine records faxed  ?

## 2021-09-12 DIAGNOSIS — R198 Other specified symptoms and signs involving the digestive system and abdomen: Secondary | ICD-10-CM | POA: Diagnosis not present

## 2021-09-12 DIAGNOSIS — R109 Unspecified abdominal pain: Secondary | ICD-10-CM | POA: Diagnosis not present

## 2021-09-13 DIAGNOSIS — K567 Ileus, unspecified: Secondary | ICD-10-CM | POA: Diagnosis not present

## 2021-09-14 DIAGNOSIS — K567 Ileus, unspecified: Secondary | ICD-10-CM | POA: Diagnosis not present

## 2021-09-19 DIAGNOSIS — R109 Unspecified abdominal pain: Secondary | ICD-10-CM | POA: Diagnosis not present

## 2021-09-25 DIAGNOSIS — Z0389 Encounter for observation for other suspected diseases and conditions ruled out: Secondary | ICD-10-CM | POA: Diagnosis not present

## 2021-09-25 DIAGNOSIS — R198 Other specified symptoms and signs involving the digestive system and abdomen: Secondary | ICD-10-CM | POA: Diagnosis not present

## 2021-09-25 DIAGNOSIS — Z950 Presence of cardiac pacemaker: Secondary | ICD-10-CM | POA: Diagnosis not present

## 2021-09-27 DIAGNOSIS — I739 Peripheral vascular disease, unspecified: Secondary | ICD-10-CM | POA: Diagnosis not present

## 2021-09-27 DIAGNOSIS — B351 Tinea unguium: Secondary | ICD-10-CM | POA: Diagnosis not present

## 2021-09-27 DIAGNOSIS — L600 Ingrowing nail: Secondary | ICD-10-CM | POA: Diagnosis not present

## 2021-09-27 DIAGNOSIS — M79675 Pain in left toe(s): Secondary | ICD-10-CM | POA: Diagnosis not present

## 2021-09-27 DIAGNOSIS — M79674 Pain in right toe(s): Secondary | ICD-10-CM | POA: Diagnosis not present

## 2021-09-28 ENCOUNTER — Encounter: Payer: Self-pay | Admitting: Family Medicine

## 2021-10-02 ENCOUNTER — Telehealth: Payer: Self-pay | Admitting: Neurology

## 2021-10-02 NOTE — Telephone Encounter (Signed)
LVM and sent mychart msg informing pt of r/s needed for 6/26 appt- MD out. ?

## 2021-10-06 DIAGNOSIS — S0990XA Unspecified injury of head, initial encounter: Secondary | ICD-10-CM | POA: Diagnosis not present

## 2021-10-06 DIAGNOSIS — D649 Anemia, unspecified: Secondary | ICD-10-CM | POA: Diagnosis not present

## 2021-10-06 DIAGNOSIS — I482 Chronic atrial fibrillation, unspecified: Secondary | ICD-10-CM | POA: Diagnosis not present

## 2021-10-06 DIAGNOSIS — I959 Hypotension, unspecified: Secondary | ICD-10-CM | POA: Diagnosis not present

## 2021-10-06 DIAGNOSIS — N184 Chronic kidney disease, stage 4 (severe): Secondary | ICD-10-CM | POA: Diagnosis not present

## 2021-10-06 DIAGNOSIS — I10 Essential (primary) hypertension: Secondary | ICD-10-CM | POA: Diagnosis not present

## 2021-10-06 DIAGNOSIS — N179 Acute kidney failure, unspecified: Secondary | ICD-10-CM | POA: Diagnosis not present

## 2021-10-09 ENCOUNTER — Telehealth: Payer: Self-pay

## 2021-10-09 NOTE — Telephone Encounter (Signed)
Patients grand daughter called wanting something in writing for the social worker LouAnne at the nursing facility that patient is currently at. Per patients grad daughter they need something in writing about patients behavior at his last visit. I called LouAnne [phone] for clarity on what all they where needing no answer LMTCB. Will call back.

## 2021-10-10 NOTE — Telephone Encounter (Signed)
LouAnne called back regarding the situation with pt and grand-daughter, trying to figure out next steps.

## 2021-10-11 NOTE — Telephone Encounter (Signed)
Called LouAnne back no answer LMTCB

## 2021-10-12 ENCOUNTER — Telehealth: Payer: Self-pay | Admitting: Neurology

## 2021-10-12 ENCOUNTER — Encounter: Payer: Self-pay | Admitting: Neurology

## 2021-10-12 ENCOUNTER — Other Ambulatory Visit: Payer: Self-pay | Admitting: Neurology

## 2021-10-12 MED ORDER — MEMANTINE HCL 28 X 5 MG & 21 X 10 MG PO TABS: ORAL_TABLET | ORAL | 12 refills | Status: DC

## 2021-10-12 NOTE — Telephone Encounter (Signed)
Pt's granddaughter is asking that Dr Leonie Man has a medication called in because pt is becoming very aggressive, very difficult to handle at the Nursing home.  Granddaughter is asking that something be called in for pt's Dementia.  Granddaughter has r/s pt's appointment and is on wait list so pt can be seen earlier than later

## 2021-10-13 DIAGNOSIS — M5134 Other intervertebral disc degeneration, thoracic region: Secondary | ICD-10-CM | POA: Diagnosis not present

## 2021-10-13 DIAGNOSIS — W19XXXA Unspecified fall, initial encounter: Secondary | ICD-10-CM | POA: Diagnosis not present

## 2021-10-16 ENCOUNTER — Other Ambulatory Visit: Payer: Self-pay | Admitting: *Deleted

## 2021-10-16 MED ORDER — MEMANTINE HCL 28 X 5 MG & 21 X 10 MG PO TABS: ORAL_TABLET | ORAL | 12 refills | Status: DC

## 2021-10-16 NOTE — Telephone Encounter (Signed)
Granddaughter sent a Pharmacist, community message as well. Dr Leonie Man has replied and will be calling in namenda for the patient. These instructions have been written and replied to the granddaughter.   **If she calls back please make aware that message was sent via my chart
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
## 2021-10-22 ENCOUNTER — Encounter: Payer: Self-pay | Admitting: Family Medicine

## 2021-10-22 DIAGNOSIS — I959 Hypotension, unspecified: Secondary | ICD-10-CM | POA: Diagnosis not present

## 2021-10-22 DIAGNOSIS — N390 Urinary tract infection, site not specified: Secondary | ICD-10-CM | POA: Diagnosis not present

## 2021-10-22 DIAGNOSIS — N1832 Chronic kidney disease, stage 3b: Secondary | ICD-10-CM | POA: Diagnosis not present

## 2021-10-22 DIAGNOSIS — K219 Gastro-esophageal reflux disease without esophagitis: Secondary | ICD-10-CM | POA: Insufficient documentation

## 2021-10-22 NOTE — Telephone Encounter (Signed)
Letter written and faxed over to facility. Patients grand daughter aware.

## 2021-10-25 DIAGNOSIS — I1310 Hypertensive heart and chronic kidney disease without heart failure, with stage 1 through stage 4 chronic kidney disease, or unspecified chronic kidney disease: Secondary | ICD-10-CM | POA: Diagnosis not present

## 2021-10-25 DIAGNOSIS — G309 Alzheimer's disease, unspecified: Secondary | ICD-10-CM | POA: Diagnosis not present

## 2021-10-25 DIAGNOSIS — E1165 Type 2 diabetes mellitus with hyperglycemia: Secondary | ICD-10-CM | POA: Diagnosis not present

## 2021-10-30 ENCOUNTER — Telehealth: Payer: Self-pay | Admitting: Neurology

## 2021-10-30 NOTE — Telephone Encounter (Signed)
Pt Granddaughter called and stated they had a family meeting/ with social workers because Pt thinks he can live alone. And due to Pt rights he will be discharge next week and granddaughter states he can't live alone safely and she wants to know if Dr. Leonie Man will write a letter stating he is incompetent. Granddaughter advise that [facility] is going to [facility] two weeks   Music therapist)

## 2021-10-31 NOTE — Telephone Encounter (Addendum)
I called granddaughter back stating the pt has been more aggressive and lashing out. He is currently at the [facility] and social workers say he will be d/c home.   Granddaughter is asking for an addendum to most recent office visit stating he is incompetent and requires memory care.   She sts this specific information is needed for placement in a facility.

## 2021-11-01 DIAGNOSIS — N2889 Other specified disorders of kidney and ureter: Secondary | ICD-10-CM | POA: Diagnosis not present

## 2021-11-01 DIAGNOSIS — N261 Atrophy of kidney (terminal): Secondary | ICD-10-CM | POA: Diagnosis not present

## 2021-11-01 DIAGNOSIS — N281 Cyst of kidney, acquired: Secondary | ICD-10-CM | POA: Diagnosis not present

## 2021-11-01 DIAGNOSIS — N189 Chronic kidney disease, unspecified: Secondary | ICD-10-CM | POA: Diagnosis not present

## 2021-11-03 ENCOUNTER — Encounter: Payer: Self-pay | Admitting: Family Medicine

## 2021-11-05 ENCOUNTER — Telehealth: Payer: Self-pay | Admitting: Family Medicine

## 2021-11-05 NOTE — Telephone Encounter (Signed)
I sent my chart message to the pt's granddaughter advising of Dr. Clydene Fake message.Garvin Fila, MD  You 5 days ago    Yes I can do that. He needs to be in supervised environment and not living alone due to his dementia and I will do addendum to my last office note

## 2021-11-05 NOTE — Telephone Encounter (Signed)
Returned call informed patients granddaughter that [place] form need to be sent to [place] from facility.

## 2021-11-05 NOTE — Telephone Encounter (Signed)
[name] is calling in checking status of Patient Message from 11/03/21. She is wanting a call back concerning these issues. Pt's [phone]

## 2021-11-08 ENCOUNTER — Ambulatory Visit: Payer: Medicare PPO | Admitting: Family Medicine

## 2021-11-09 DIAGNOSIS — I10 Essential (primary) hypertension: Secondary | ICD-10-CM | POA: Diagnosis not present

## 2021-11-09 DIAGNOSIS — E1165 Type 2 diabetes mellitus with hyperglycemia: Secondary | ICD-10-CM | POA: Diagnosis not present

## 2021-11-12 ENCOUNTER — Ambulatory Visit: Payer: No Typology Code available for payment source | Admitting: Neurology

## 2021-11-15 ENCOUNTER — Telehealth: Payer: Self-pay

## 2021-11-15 NOTE — Telephone Encounter (Signed)
The patient nurse Lattie Haw called to confirm if he needed to do a pacer check. I let her know we need one anytime today or tomorrow. She states she will call back tomorrow.

## 2021-11-23 ENCOUNTER — Ambulatory Visit (INDEPENDENT_AMBULATORY_CARE_PROVIDER_SITE_OTHER): Payer: Medicare (Managed Care)

## 2021-11-23 DIAGNOSIS — I639 Cerebral infarction, unspecified: Secondary | ICD-10-CM

## 2021-11-23 NOTE — Telephone Encounter (Signed)
I spoke with the nurse at [facility] facility [phone]) and helped her  send a transmission. The transmission completed the steps but I do not see it. I told her maybe its a delay in the system. I will look for it later today.

## 2021-11-23 NOTE — Telephone Encounter (Signed)
I spoke with the patient granddaughter and she is going to have the facility to help send the transmission for the patient.

## 2021-11-24 ENCOUNTER — Encounter: Payer: Self-pay | Admitting: Family Medicine

## 2021-11-25 LAB — CUP PACEART REMOTE DEVICE CHECK
Battery Impedance: 6372 Ohm
Battery Remaining Longevity: 3 mo
Battery Voltage: 2.64 V
Brady Statistic RV Percent Paced: 14 %
Date Time Interrogation Session: 20230707141143
Implantable Lead Implant Date: 20060510
Implantable Lead Location: 753860
Implantable Lead Model: 5076
Implantable Pulse Generator Implant Date: 20131118
Lead Channel Impedance Value: 0 Ohm
Lead Channel Impedance Value: 420 Ohm
Lead Channel Pacing Threshold Amplitude: 1.125 V
Lead Channel Pacing Threshold Pulse Width: 0.4 ms
Lead Channel Setting Pacing Amplitude: 2.5 V
Lead Channel Setting Pacing Pulse Width: 0.4 ms
Lead Channel Setting Sensing Sensitivity: 5.6 mV

## 2021-11-26 ENCOUNTER — Telehealth: Payer: Self-pay | Admitting: Neurology

## 2021-11-26 MED ORDER — FOLIC ACID 1 MG PO TABS
1.0000 mg | ORAL_TABLET | Freq: Every day | ORAL | 3 refills | Status: DC
Start: 2021-11-26 — End: 2021-11-30

## 2021-11-26 MED ORDER — MEMANTINE HCL 10 MG PO TABS: 10.0000 mg | ORAL_TABLET | Freq: Two times a day (BID) | ORAL | 3 refills | Status: DC

## 2021-11-26 NOTE — Telephone Encounter (Signed)
Pt's granddaughter is asking if a prescription for folic acid (FOLVITE) 1 MG tablet, memantine (NAMENDA TITRATION PAK) tablet pack, [facility] [address]  since pt is no longer at the Nursing home.

## 2021-11-26 NOTE — Telephone Encounter (Signed)
I spoke with the patient's granddaughter, [name] (as per DPR). I confirmed with her that the patient has titrated up to memantine 10 MG tablet BID. She requested the medications to be sent to the [facility] in [place]. Will send rx.

## 2021-11-27 NOTE — Telephone Encounter (Signed)
Transmission received 11-23-2021.

## 2021-11-28 ENCOUNTER — Ambulatory Visit: Payer: BC Managed Care – PPO | Admitting: Family Medicine

## 2021-11-30 ENCOUNTER — Ambulatory Visit (INDEPENDENT_AMBULATORY_CARE_PROVIDER_SITE_OTHER): Payer: Medicare PPO | Admitting: Family Medicine

## 2021-11-30 ENCOUNTER — Encounter: Payer: Self-pay | Admitting: Family Medicine

## 2021-11-30 VITALS — BP 98/68 | HR 92 | Temp 97.5°F | Ht 71.0 in | Wt 161.6 lb

## 2021-11-30 DIAGNOSIS — R7989 Other specified abnormal findings of blood chemistry: Secondary | ICD-10-CM

## 2021-11-30 DIAGNOSIS — E119 Type 2 diabetes mellitus without complications: Secondary | ICD-10-CM

## 2021-11-30 DIAGNOSIS — E441 Mild protein-calorie malnutrition: Secondary | ICD-10-CM

## 2021-11-30 DIAGNOSIS — R790 Abnormal level of blood mineral: Secondary | ICD-10-CM | POA: Insufficient documentation

## 2021-11-30 DIAGNOSIS — N1832 Chronic kidney disease, stage 3b: Secondary | ICD-10-CM

## 2021-11-30 DIAGNOSIS — E538 Deficiency of other specified B group vitamins: Secondary | ICD-10-CM

## 2021-11-30 DIAGNOSIS — N401 Enlarged prostate with lower urinary tract symptoms: Secondary | ICD-10-CM | POA: Diagnosis not present

## 2021-11-30 DIAGNOSIS — N481 Balanitis: Secondary | ICD-10-CM | POA: Insufficient documentation

## 2021-11-30 DIAGNOSIS — D509 Iron deficiency anemia, unspecified: Secondary | ICD-10-CM

## 2021-11-30 DIAGNOSIS — R3912 Poor urinary stream: Secondary | ICD-10-CM

## 2021-11-30 LAB — BASIC METABOLIC PANEL
BUN: 28 mg/dL — ABNORMAL HIGH (ref 6–23)
CO2: 20 mEq/L (ref 19–32)
Calcium: 7.3 mg/dL — ABNORMAL LOW (ref 8.4–10.5)
Chloride: 113 mEq/L — ABNORMAL HIGH (ref 96–112)
Creatinine, Ser: 1.93 mg/dL — ABNORMAL HIGH (ref 0.40–1.50)
GFR: 29.01 mL/min — ABNORMAL LOW (ref >60.00)
Glucose, Bld: 144 mg/dL — ABNORMAL HIGH (ref 70–99)
Potassium: 5 mEq/L (ref 3.5–5.1)
Sodium: 141 mEq/L (ref 135–145)

## 2021-11-30 LAB — CBC
HCT: 29.4 % — ABNORMAL LOW (ref 39.0–52.0)
Hemoglobin: 9.5 g/dL — ABNORMAL LOW (ref 13.0–17.0)
MCHC: 32.3 g/dL (ref 30.0–36.0)
MCV: 82.1 fl (ref 78.0–100.0)
Platelets: 136 10*3/uL — ABNORMAL LOW (ref 150.0–400.0)
RBC: 3.59 Mil/uL — ABNORMAL LOW (ref 4.22–5.81)
RDW: 18.5 % — ABNORMAL HIGH (ref 11.5–15.5)
WBC: 6.4 10*3/uL (ref 4.0–10.5)

## 2021-11-30 LAB — MAGNESIUM: Magnesium: 1.7 mg/dL (ref 1.5–2.5)

## 2021-11-30 LAB — HEMOGLOBIN A1C: Hgb A1c MFr Bld: 7 % — ABNORMAL HIGH (ref 4.6–6.5)

## 2021-11-30 MED ORDER — FERROUS SULFATE 325 (65 FE) MG PO TABS: 325.0000 mg | ORAL_TABLET | ORAL | 2 refills | Status: DC

## 2021-11-30 MED ORDER — TRIAMCINOLONE ACETONIDE 0.1 % EX CREA: TOPICAL_CREAM | Freq: Every day | CUTANEOUS | 1 refills | Status: DC

## 2021-11-30 MED ORDER — FOLIC ACID 1 MG PO TABS
1.0000 mg | ORAL_TABLET | Freq: Every day | ORAL | 3 refills | Status: DC
Start: 2021-11-30 — End: 2022-06-13

## 2021-11-30 MED ORDER — EMPAGLIFLOZIN 10 MG PO TABS: 10.0000 mg | ORAL_TABLET | Freq: Every day | ORAL | 5 refills | Status: DC

## 2021-11-30 NOTE — Progress Notes (Signed)
Established Patient Office Visit  Subjective   Patient ID: [name], male    DOB: 07-05-1923  Age: 88 y.o. MRN: [id number]  Chief Complaint  Patient presents with   Follow-up    Routine follow up states that he has dizzy spells sometimes when bending. Growth on body patient would like checked. Patient fasting    HPI here with his daughter [name] and son-in-law.  Things did not go well in the [facility] home.  He is back home.  He is assisted by his son-in-law and part-time Marine scientist.  He had seen a urologist up in [place] who had started him on Jardiance.  Homocystine levels were elevated and a dementia panel.  Folic acid was recommended..  Blood pressure has been low.  All antihypertensive meds have been discontinued.  He has a problem with his penis that he would like for me to take a look at.  Denies issues with urine flow at this time.  PSA was found to be mildly elevated by the urology group in [place]    Review of Systems  Constitutional: Negative.   HENT: Negative.    Eyes:  Negative for blurred vision, discharge and redness.  Respiratory: Negative.    Cardiovascular: Negative.   Gastrointestinal:  Negative for abdominal pain.  Genitourinary: Negative.   Musculoskeletal: Negative.  Negative for myalgias.  Skin:  Negative for rash.  Neurological:  Negative for tingling, loss of consciousness and weakness.  Endo/Heme/Allergies:  Negative for polydipsia.      Objective:     BP 98/68 (BP Location: Left Arm, Patient Position: Sitting, Cuff Size: Normal)   Pulse 92   Temp (!) 97.5 F (36.4 C) (Temporal)   Ht '5\' 11"'$  (1.803 m)   Wt 161 lb 9.6 oz (73.3 kg)   SpO2 97%   BMI 22.54 kg/m  BP Readings from Last 3 Encounters:  11/30/21 98/68  08/08/21 115/68  08/08/21 115/73   Wt Readings from Last 3 Encounters:  11/30/21 161 lb 9.6 oz (73.3 kg)  08/08/21 148 lb 6.4 oz (67.3 kg)  08/08/21 148 lb (67.1 kg)      Physical  Exam Constitutional:      General: He is not in acute distress.    Appearance: Normal appearance. He is not ill-appearing, toxic-appearing or diaphoretic.  HENT:     Head: Normocephalic and atraumatic.     Right Ear: External ear normal.     Left Ear: External ear normal.     Mouth/Throat:     Pharynx: No posterior oropharyngeal erythema.  Eyes:     General: No scleral icterus.       Right eye: No discharge.        Left eye: No discharge.     Extraocular Movements: Extraocular movements intact.     Conjunctiva/sclera: Conjunctivae normal.  Cardiovascular:     Rate and Rhythm: Normal rate and regular rhythm.  Pulmonary:     Effort: Pulmonary effort is normal. No respiratory distress.     Breath sounds: Normal breath sounds.  Genitourinary:    Penis: Uncircumcised. Swelling present. No discharge or lesions.      Testes:        Right: Mass, tenderness or swelling not present. Right testis is descended.        Left: Mass, tenderness or swelling not present. Left testis is descended.  Skin:    General: Skin is warm and dry.  Neurological:     Mental Status: He is alert  and oriented to person, place, and time.  Psychiatric:        Mood and Affect: Mood normal.        Behavior: Behavior normal.      No results found for any visits on 11/30/21.    The ASCVD Risk score (Arnett DK, et al., 2019) failed to calculate for the following reasons:   The 2019 ASCVD risk score is only valid for ages 67 to 71   The patient has a prior MI or stroke diagnosis    Assessment & Plan:   Problem List Items Addressed This Visit       Endocrine   Type 2 diabetes mellitus without complication, without long-term current use of insulin ([place]) - Primary   Relevant Medications   atorvastatin (LIPITOR) 10 MG tablet   empagliflozin (JARDIANCE) 10 MG TABS tablet   Other Relevant Orders   Basic metabolic panel   Hemoglobin A1c     Genitourinary   Stage 3b chronic kidney disease ([name])    Relevant Medications   empagliflozin (JARDIANCE) 10 MG TABS tablet   Other Relevant Orders   Basic metabolic panel   Balanitis   Relevant Medications   ketoconazole 2%-triamcinolone 0.1% 1:2 cream mixture     Other   Microcytic anemia   Relevant Medications   ferrous sulfate (FEROSUL) 325 (65 FE) MG tablet   folic acid (FOLVITE) 1 MG tablet   Iron deficiency anemia   Relevant Medications   ferrous sulfate (FEROSUL) 325 (65 FE) MG tablet   folic acid (FOLVITE) 1 MG tablet   Other Relevant Orders   CBC   Iron, TIBC and Ferritin Panel   B12 deficiency   Elevated homocysteine   Relevant Medications   folic acid (FOLVITE) 1 MG tablet   Other Relevant Orders   Homocysteine   Low magnesium level   Relevant Orders   Magnesium   Other Visit Diagnoses     Benign prostatic hyperplasia with weak urinary stream       Relevant Orders   Ambulatory referral to Urology   Mild protein-calorie malnutrition ([place])       Relevant Orders   Prealbumin       Return in about 3 months (around 03/02/2022).  Rechecking CBC with iron, B12, homocystine, magnesium and prealbumin.  Encouraged increased nutritional intake.  May advise supplementation.  Adjust supplements pending lab results.  Ketoconazole with triamcinolone for balanitis.  Apply daily for 10 days.  Urology referral for BPH and elevated PSA.    [name], MD

## 2021-12-02 LAB — IRON,TIBC AND FERRITIN PANEL
%SAT: 38 % (calc) (ref 20–48)
Ferritin: 94 ng/mL (ref 24–380)
Iron: 77 ug/dL (ref 50–180)
TIBC: 203 mcg/dL (calc) — ABNORMAL LOW (ref 250–425)

## 2021-12-02 LAB — PREALBUMIN: Prealbumin: 14 mg/dL — ABNORMAL LOW (ref 21–43)

## 2021-12-02 LAB — HOMOCYSTEINE: Homocysteine: 13.3 umol/L — ABNORMAL HIGH (ref <11.4)

## 2021-12-03 ENCOUNTER — Other Ambulatory Visit: Payer: Self-pay

## 2021-12-03 ENCOUNTER — Telehealth: Payer: Self-pay | Admitting: Family Medicine

## 2021-12-03 ENCOUNTER — Ambulatory Visit: Payer: BC Managed Care – PPO

## 2021-12-03 DIAGNOSIS — N481 Balanitis: Secondary | ICD-10-CM

## 2021-12-03 MED ORDER — TRIAMCINOLONE ACETONIDE 0.1 % EX CREA: TOPICAL_CREAM | Freq: Every day | CUTANEOUS | 1 refills | Status: DC

## 2021-12-03 NOTE — Telephone Encounter (Signed)
Caller Name: Edrick Kins Call back phone #: [phone]  Reason for Call: pt's granddaughter set up an appt for this Wed 7/19, she has questions about an at home nurse and would like a call back.

## 2021-12-03 NOTE — Telephone Encounter (Signed)
[facility] [address], [place], [place] [address]. They can mix this script at this location.

## 2021-12-03 NOTE — Telephone Encounter (Signed)
Caller Name: Edrick Kins Call back phone #: [phone]  MEDICATION(S): Ketoconazole   Days of Med Remaining: has none  Has the patient contacted their pharmacy (YES/NO)?  yes IF YES, when and what did the pharmacy advise? They could not fulfill, has to go to the pharmacy on [address]  IF NO, request that the patient contact the pharmacy for the refills in the future.             The pharmacy will send an electronic request (except for controlled medications).  Preferred Pharmacy: [address], [place], [place] [address]  ~~~Please advise patient/caregiver to allow 2-3 business days to process RX refills.

## 2021-12-03 NOTE — Telephone Encounter (Signed)
Rx ready for pick up at previous requested pharmacy, called granddaughter to inform no answer unable to [facility] but pharmacy states that someone was coming to pick up today grand daughter called.

## 2021-12-03 NOTE — Telephone Encounter (Signed)
Called pharmacy who states they never received requested Rx that was sent on 11/30/21 resent Rx.

## 2021-12-04 ENCOUNTER — Encounter: Payer: Self-pay | Admitting: Family Medicine

## 2021-12-04 DIAGNOSIS — N401 Enlarged prostate with lower urinary tract symptoms: Secondary | ICD-10-CM

## 2021-12-04 DIAGNOSIS — N1832 Chronic kidney disease, stage 3b: Secondary | ICD-10-CM

## 2021-12-04 MED ORDER — EMPAGLIFLOZIN 10 MG PO TABS: 10.0000 mg | ORAL_TABLET | Freq: Every day | ORAL | 5 refills | Status: DC

## 2021-12-04 MED ORDER — TAMSULOSIN HCL 0.4 MG PO CAPS: 0.4000 mg | ORAL_CAPSULE | Freq: Every day | ORAL | 3 refills | Status: DC

## 2021-12-04 NOTE — Addendum Note (Signed)
Addended by: Abelino Derrick A on: 12/04/2021 12:20 PM   Modules accepted: Orders

## 2021-12-05 ENCOUNTER — Other Ambulatory Visit: Payer: Self-pay

## 2021-12-05 ENCOUNTER — Encounter: Payer: Self-pay | Admitting: Family Medicine

## 2021-12-05 ENCOUNTER — Emergency Department (HOSPITAL_BASED_OUTPATIENT_CLINIC_OR_DEPARTMENT_OTHER)
Admission: EM | Admit: 2021-12-05 | Discharge: 2021-12-05 | Disposition: A | Discharge status: Home / Self Care | Payer: Medicare PPO | Attending: Emergency Medicine | Admitting: Emergency Medicine

## 2021-12-05 ENCOUNTER — Ambulatory Visit (INDEPENDENT_AMBULATORY_CARE_PROVIDER_SITE_OTHER): Payer: Medicare PPO | Admitting: Family Medicine

## 2021-12-05 ENCOUNTER — Emergency Department (HOSPITAL_BASED_OUTPATIENT_CLINIC_OR_DEPARTMENT_OTHER): Payer: Medicare PPO | Admitting: Radiology

## 2021-12-05 ENCOUNTER — Encounter (HOSPITAL_BASED_OUTPATIENT_CLINIC_OR_DEPARTMENT_OTHER): Payer: Self-pay | Admitting: Obstetrics and Gynecology

## 2021-12-05 VITALS — BP 110/78 | HR 50 | Temp 97.3°F | Wt 161.6 lb

## 2021-12-05 DIAGNOSIS — R600 Localized edema: Secondary | ICD-10-CM

## 2021-12-05 DIAGNOSIS — M7989 Other specified soft tissue disorders: Secondary | ICD-10-CM | POA: Diagnosis not present

## 2021-12-05 DIAGNOSIS — R609 Edema, unspecified: Secondary | ICD-10-CM

## 2021-12-05 DIAGNOSIS — N138 Other obstructive and reflux uropathy: Secondary | ICD-10-CM | POA: Diagnosis not present

## 2021-12-05 DIAGNOSIS — N189 Chronic kidney disease, unspecified: Secondary | ICD-10-CM | POA: Diagnosis not present

## 2021-12-05 DIAGNOSIS — N5089 Other specified disorders of the male genital organs: Secondary | ICD-10-CM | POA: Diagnosis not present

## 2021-12-05 DIAGNOSIS — J90 Pleural effusion, not elsewhere classified: Secondary | ICD-10-CM | POA: Diagnosis not present

## 2021-12-05 DIAGNOSIS — J9811 Atelectasis: Secondary | ICD-10-CM | POA: Diagnosis not present

## 2021-12-05 DIAGNOSIS — N400 Enlarged prostate without lower urinary tract symptoms: Secondary | ICD-10-CM | POA: Insufficient documentation

## 2021-12-05 DIAGNOSIS — N401 Enlarged prostate with lower urinary tract symptoms: Secondary | ICD-10-CM | POA: Diagnosis not present

## 2021-12-05 DIAGNOSIS — H401124 Primary open-angle glaucoma, left eye, indeterminate stage: Secondary | ICD-10-CM | POA: Diagnosis not present

## 2021-12-05 DIAGNOSIS — I495 Sick sinus syndrome: Secondary | ICD-10-CM

## 2021-12-05 DIAGNOSIS — Z7901 Long term (current) use of anticoagulants: Secondary | ICD-10-CM | POA: Diagnosis not present

## 2021-12-05 DIAGNOSIS — H04223 Epiphora due to insufficient drainage, bilateral lacrimal glands: Secondary | ICD-10-CM | POA: Diagnosis not present

## 2021-12-05 DIAGNOSIS — Z961 Presence of intraocular lens: Secondary | ICD-10-CM | POA: Diagnosis not present

## 2021-12-05 DIAGNOSIS — Z950 Presence of cardiac pacemaker: Secondary | ICD-10-CM | POA: Diagnosis not present

## 2021-12-05 DIAGNOSIS — J439 Emphysema, unspecified: Secondary | ICD-10-CM | POA: Diagnosis not present

## 2021-12-05 DIAGNOSIS — H40001 Preglaucoma, unspecified, right eye: Secondary | ICD-10-CM | POA: Diagnosis not present

## 2021-12-05 LAB — TROPONIN I (HIGH SENSITIVITY): Troponin I (High Sensitivity): 21 ng/L — ABNORMAL HIGH (ref <18)

## 2021-12-05 LAB — CBC
HCT: 28.6 % — ABNORMAL LOW (ref 39.0–52.0)
Hemoglobin: 9.5 g/dL — ABNORMAL LOW (ref 13.0–17.0)
MCH: 25.9 pg — ABNORMAL LOW (ref 26.0–34.0)
MCHC: 33.2 g/dL (ref 30.0–36.0)
MCV: 77.9 fL — ABNORMAL LOW (ref 80.0–100.0)
Platelets: 155 10*3/uL (ref 150–400)
RBC: 3.67 MIL/uL — ABNORMAL LOW (ref 4.22–5.81)
RDW: 17.4 % — ABNORMAL HIGH (ref 11.5–15.5)
WBC: 7.4 10*3/uL (ref 4.0–10.5)
nRBC: 0 % (ref 0.0–0.2)

## 2021-12-05 LAB — BASIC METABOLIC PANEL
Anion gap: 13 (ref 5–15)
BUN: 23 mg/dL (ref 8–23)
CO2: 17 mmol/L — ABNORMAL LOW (ref 22–32)
Calcium: 7.9 mg/dL — ABNORMAL LOW (ref 8.9–10.3)
Chloride: 113 mmol/L — ABNORMAL HIGH (ref 98–111)
Creatinine, Ser: 1.91 mg/dL — ABNORMAL HIGH (ref 0.61–1.24)
GFR, Estimated: 32 mL/min — ABNORMAL LOW (ref >60)
Glucose, Bld: 221 mg/dL — ABNORMAL HIGH (ref 70–99)
Potassium: 4.3 mmol/L (ref 3.5–5.1)
Sodium: 143 mmol/L (ref 135–145)

## 2021-12-05 LAB — BRAIN NATRIURETIC PEPTIDE: B Natriuretic Peptide: 531.7 pg/mL — ABNORMAL HIGH (ref 0.0–100.0)

## 2021-12-05 MED ORDER — FUROSEMIDE 20 MG PO TABS: 20.0000 mg | ORAL_TABLET | Freq: Every day | ORAL | 0 refills | Status: DC

## 2021-12-05 NOTE — ED Triage Notes (Signed)
Patient reports to the ER for groin swelling and swelling to his legs bilaterally. Patient reports ot started x1 week ago. Denies trouble breathing.

## 2021-12-05 NOTE — ED Provider Notes (Signed)
[facility] EMERGENCY DEPT Provider Note   CSN: [id number] Arrival date & time: 12/05/21  1332     History  Chief Complaint  Patient presents with   Groin Swelling    [name] is a 88 y.o. male presented to ED complaining of leg and arm swelling and scrotal swelling.  He reports this began about a week ago.  He has not noticed any worsening shortness of breath.  His granddaughter at bedside reports that they were ambulating around [facility] yesterday.  He denies chest pain or pressure.  He does have history of chronic kidney disease.  His daughter reports that he was recently removed from a nursing home to come back to live with his family, and reports that he has not had his [place] medication since then, but has his other medications, including his Xarelto.  He is on Xarelto for A-fib.  He also has a pacemaker placed.  He is not on any diuretics at home.  There were seen by their PCP today advised to come to the ED for further evaluation  HPI     Home Medications Prior to Admission medications   Medication Sig Start Date End Date Taking? Authorizing Provider  furosemide (LASIX) 20 MG tablet Take 1 tablet (20 mg total) by mouth daily for 5 doses. 12/05/21 12/10/21 Yes Valbona Slabach, Carola Rhine, MD  atorvastatin (LIPITOR) 10 MG tablet Take 1 tablet by mouth at bedtime. 03/28/21   [provider]  docusate sodium (COLACE) 100 MG capsule Take 1 capsule (100 mg total) by mouth 2 (two) times daily. 08/03/21   McElwee, Scheryl Darter, NP  empagliflozin (JARDIANCE) 10 MG TABS tablet Take 1 tablet (10 mg total) by mouth daily before breakfast. 12/04/21   [name], MD  ferrous sulfate (FEROSUL) 325 (65 FE) MG tablet Take 1 tablet (325 mg total) by mouth every other day. 11/30/21   [name], MD  folic acid (FOLVITE) 1 MG tablet Take 1 tablet (1 mg total) by mouth daily. 11/30/21 11/30/22  [name], MD  ketoconazole 2%-triamcinolone 0.1% 1:2  cream mixture Apply topically daily. Apply to foreskin daily for 10 days. 12/03/21   [name], MD  latanoprost (XALATAN) 0.005 % ophthalmic solution Place 1 drop into both eyes nightly. 05/03/20   [provider]  losartan (COZAAR) 100 MG tablet Take 1 tablet (100 mg total) by mouth daily. 08/03/21   McElwee, Scheryl Darter, NP  memantine (NAMENDA TITRATION PAK) tablet pack 5 mg/day for =1 week; 5 mg twice daily for =1 week; 15 mg/day given in 5 mg and 10 mg separated doses for =1 week; then 10 mg twice daily 10/16/21   Garvin Fila, MD  memantine (NAMENDA) 10 MG tablet Take 1 tablet (10 mg total) by mouth 2 (two) times daily. 11/26/21   Garvin Fila, MD  metoprolol succinate (TOPROL-XL) 25 MG 24 hr tablet TAKE 1 TABLET(25 MG) BY MOUTH EVERY EVENING Patient not taking: Reported on 12/05/2021 04/03/21   [name], MD  metoprolol succinate (TOPROL-XL) 50 MG 24 hr tablet Take by mouth.    [provider]  Nutritional Supplements (GLUCERNA ADVANCE SHAKE PO) Take by mouth.    [provider]  pantoprazole (PROTONIX) 40 MG tablet TAKE 1 TABLET(40 MG) BY MOUTH DAILY 06/26/20   Cirigliano, Mary K, DO  polyethylene glycol powder (GLYCOLAX/MIRALAX) 17 GM/SCOOP powder Take 17 g by mouth 2 (two) times daily as needed. 08/03/21   Charyl Dancer, NP  QUEtiapine (SEROQUEL) 25 MG tablet Take 25 mg by mouth at bedtime.    [provider]  sulfamethoxazole-trimethoprim (BACTRIM DS) 800-160 MG tablet Take 1 tablet by mouth 2 (two) times daily. Patient not taking: Reported on 11/30/2021 08/03/21   Charyl Dancer, NP  tamsulosin (FLOMAX) 0.4 MG CAPS capsule Take 1 capsule (0.4 mg total) by mouth daily. 12/04/21   [name], MD  XARELTO 15 MG TABS tablet TAKE 1 TABLET(15 MG) BY MOUTH DAILY WITH SUPPER Patient taking differently: 15 mg daily with supper. 04/03/21   [name], MD      Allergies    Patient has no known allergies.     Review of Systems   Review of Systems  Physical Exam Updated Vital Signs BP 137/90   Pulse 84   Temp 98.2 F (36.8 C) (Oral)   Resp 16   Ht '5\' 11"'$  (1.803 m)   Wt 73 kg   SpO2 97%   BMI 22.45 kg/m  Physical Exam Constitutional:      General: He is not in acute distress. HENT:     Head: Normocephalic and atraumatic.  Eyes:     Conjunctiva/sclera: Conjunctivae normal.     Pupils: Pupils are equal, round, and reactive to light.  Cardiovascular:     Rate and Rhythm: Normal rate and regular rhythm.  Pulmonary:     Effort: Pulmonary effort is normal. No respiratory distress.     Breath sounds: Normal breath sounds.  Abdominal:     General: There is no distension.     Tenderness: There is no abdominal tenderness.  Genitourinary:    Comments: Nontender scrotal edema Musculoskeletal:     Comments: Symmetrical pitting edema of lower extremity  Skin:    General: Skin is warm and dry.  Neurological:     General: No focal deficit present.     Mental Status: He is alert. Mental status is at baseline.  Psychiatric:        Mood and Affect: Mood normal.        Behavior: Behavior normal.     ED Results / Procedures / Treatments   Labs (all labs ordered are listed, but only abnormal results are displayed) Labs Reviewed  BASIC METABOLIC PANEL - Abnormal; Notable for the following components:      Result Value   Chloride 113 (*)    CO2 17 (*)    Glucose, Bld 221 (*)    Creatinine, Ser 1.91 (*)    Calcium 7.9 (*)    GFR, Estimated 32 (*)    All other components within normal limits  CBC - Abnormal; Notable for the following components:   RBC 3.67 (*)    Hemoglobin 9.5 (*)    HCT 28.6 (*)    MCV 77.9 (*)    MCH 25.9 (*)    RDW 17.4 (*)    All other components within normal limits  BRAIN NATRIURETIC PEPTIDE - Abnormal; Notable for the following components:   B Natriuretic Peptide 531.7 (*)    All other components within normal limits  TROPONIN I (HIGH SENSITIVITY) -  Abnormal; Notable for the following components:   Troponin I (High Sensitivity) 21 (*)    All other components within normal limits    EKG EKG Interpretation  Date/Time:  Wednesday December 05 2021 14:12:15 EDT Ventricular Rate:  87 PR Interval:    QRS Duration: 96 QT Interval:  402 QTC Calculation: 484 R Axis:   7 Text Interpretation: Atrial  fibrillation Probable anterior infarct, age indeterminate Confirmed by Octaviano Glow [phone]) on 12/05/2021 2:23:29 PM  Radiology DG Chest 2 View  Result Date: 12/05/2021 CLINICAL DATA:  Leg swelling. EXAM: CHEST - 2 VIEW COMPARISON:  Chest x-ray 07/29/2021 FINDINGS: The cardiac silhouette, mediastinal hilar contours are stable. Stable pacer wires. Underlying emphysematous changes and pulmonary scarring with superimposed vascular congestion. No overt pulmonary edema. Small bilateral pleural effusions are noted. Overlying bibasilar atelectasis. The bony thorax is intact. IMPRESSION: Underlying chronic lung changes with superimposed vascular congestion and small bilateral pleural effusions with overlying bibasilar atelectasis. Electronically Signed   By: Marijo Sanes M.D.   On: 12/05/2021 14:39    Procedures Procedures    Medications Ordered in ED Medications - No data to display  ED Course/ Medical Decision Making/ A&P Clinical Course as of 12/05/21 1851  Wed Dec 05, 2021  1513 I updated the patient and granddaughter at bedside.  I suspect is consistent with congestive heart failure.  From a respiratory standpoint he is stable and breathing comfortably, not requiring hospitalization.  I would recommend he follow-up with a cardiologist and she and the family member tells me they will call today to make an appointment.  They are ready have a nephrology appointment coming up.  I think a short course of Lasix, 20 mg for 5 days might be reasonable given his symptoms.  I would not have him on a longer course given his elevated creatinine already.  I have a  lower suspicion for ACS or PE based on his clinical presentation.  His hemoglobin is stable and near baseline at 9.5.  I doubt this is symptomatic anemia. [MT]  1513 We also discussed compression stockings and elevation of the lower extremities and the scrotum at home.  He verbalized understanding. [MT]    Clinical Course User Index [MT] Brentin Shin, Carola Rhine, MD                           Medical Decision Making Amount and/or Complexity of Data Reviewed Labs: ordered. Radiology: ordered.  Risk Prescription drug management.   Patient is here with peripheral edema worsening for the past week.  Differential diagnoses include heart failure versus venous flow obstruction versus venous stasis versus other  He is breathing quite comfortably on exam.  No report of dyspnea on exertion or hypoxia with exertion.  His x-ray was ordered and personally reviewed and interpreted by myself, showing mild pleural effusion and edema, no PNA  His EKG per my personal interpretation shows atrial fibrillation with no acute ischemic findings.  I ordered and reviewed the patient's labs, which were notable for hgb at baseline, bnp 531 elevated from priors, trop 21 in setting of CKD & many days of symptoms - unlikely ACS.  Cr mildly elevated at 1.9 (baseline 1.6) but stable from prior check this week  ECG per my interpretation showing stable A fib, no acute ischemia Telemetry reviewed and no emergent arrhythmias noted  Supplemental hx provided by patient's granddaughter External records reviewed - PCP evaluation today for peripheral edema and referral to ED for further w/u  I do not see evidence of pulmonary embolism on his clinical exam, or infection/sepsis  CT abdomen reviewed from March of this year - he had hernia complications & SBO at the time, but no large obstructing mass on the venous system.  I don't think we need repeat CT imaging at this time.  He has no abdominal pain and is  eating and having BM  regularly.  Disposition Hospitalization was considered, but overall he was felt stable for close outpatient follow up.        Final Clinical Impression(s) / ED Diagnoses Final diagnoses:  Peripheral edema    Rx / DC Orders ED Discharge Orders          Ordered    furosemide (LASIX) 20 MG tablet  Daily        12/05/21 1512              Wyvonnia Dusky, MD 12/05/21 [phone]

## 2021-12-05 NOTE — Discharge Instructions (Addendum)
Please call your cardiologist office to schedule follow-up appointment.  You should also follow-up with nephrologist as scheduled.  I prescribed 5 days of a diuretic called Lasix.  You should generally take this in the morning as it would likely make you pee a lot.  This will help remove some of the fluid from your lungs and extremities.  I also strongly recommend that you wear compression stockings, particularly when walking and standing on your feet in the daytime.  I recommend he try to keep your legs elevated at night or when you are resting on the recliner.  Try to keep them to waist level or higher.  You could also talk a small folded towel under your scrotum to elevate this as well.

## 2021-12-05 NOTE — ED Notes (Signed)
Patient transported to X-ray 

## 2021-12-05 NOTE — Progress Notes (Signed)
Established Patient Office Visit  Subjective   Patient ID: [name], male    DOB: 1924/02/08  Age: 88 y.o. MRN: [id number]  Chief Complaint  Patient presents with   Edema    Swelling in genitals and hands x 1 week. Patient is having trouble controlling voiding. He states he can't and then he uses the bathroom on himself.     HPI presents with family, daughter and son-in-law, who are reporting increased swelling in genital area with increased urinary hesitancy and swelling in the lower extremities.  Patient has noted a decreased flow of urine as well.  Has an appointment with urology on Friday.    Review of Systems  Constitutional: Negative.   HENT: Negative.    Eyes:  Negative for blurred vision, discharge and redness.  Respiratory: Negative.  Negative for shortness of breath.   Cardiovascular: Negative.  Negative for chest pain and palpitations.  Gastrointestinal:  Negative for abdominal pain.  Genitourinary: Negative.   Musculoskeletal: Negative.  Negative for myalgias.  Skin:  Negative for rash.  Neurological:  Negative for tingling, loss of consciousness and weakness.  Endo/Heme/Allergies:  Negative for polydipsia.  Psychiatric/Behavioral: Negative.        Objective:     BP 110/78 (BP Location: Left Arm, Patient Position: Sitting, Cuff Size: Large)   Pulse (!) 50   Temp (!) 97.3 F (36.3 C) (Temporal)   Wt 161 lb 9.6 oz (73.3 kg)   SpO2 99%   BMI 22.54 kg/m    Physical Exam Constitutional:      General: He is not in acute distress.    Appearance: Normal appearance. He is not ill-appearing, toxic-appearing or diaphoretic.  HENT:     Head: Normocephalic and atraumatic.     Right Ear: External ear normal.     Left Ear: External ear normal.     Mouth/Throat:     Mouth: Mucous membranes are moist.     Pharynx: Oropharynx is clear. No oropharyngeal exudate or posterior oropharyngeal erythema.  Eyes:     General: No scleral icterus.       Right eye: No  discharge.        Left eye: No discharge.     Extraocular Movements: Extraocular movements intact.     Conjunctiva/sclera: Conjunctivae normal.     Pupils: Pupils are equal, round, and reactive to light.  Cardiovascular:     Rate and Rhythm: Normal rate. Rhythm regularly irregular.  Pulmonary:     Effort: Pulmonary effort is normal. No respiratory distress.     Breath sounds: Normal breath sounds. No wheezing or rales.  Abdominal:     General: Bowel sounds are normal.     Tenderness: There is no abdominal tenderness. There is no guarding.  Genitourinary:    Penis: Uncircumcised.      Testes:        Right: Swelling present.        Left: Swelling present.  Musculoskeletal:     Cervical back: No rigidity or tenderness.     Right lower leg: Edema present.     Left lower leg: Edema present.  Skin:    General: Skin is warm and dry.  Neurological:     Mental Status: He is alert and oriented to person, place, and time.  Psychiatric:        Mood and Affect: Mood normal.        Behavior: Behavior normal.      No results found for any visits  on 12/05/21.    The ASCVD Risk score (Arnett DK, et al., 2019) failed to calculate for the following reasons:   The 2019 ASCVD risk score is only valid for ages 18 to 26   The patient has a prior MI or stroke diagnosis    Assessment & Plan:   Problem List Items Addressed This Visit       Cardiovascular and Mediastinum   BRADYCARDIA-TACHYCARDIA SYNDROME     Genitourinary   Benign prostatic hyperplasia with urinary obstruction - Primary     Other   Lower extremity edema   Scrotum swelling    Return Proceed to ER..  Out of concern of decreased urine flow, scrotal swelling and lower extremity edema and recommending the patient go to the emergency room.  As above he does not appear to be in acute distress  [name], MD

## 2021-12-06 ENCOUNTER — Telehealth: Payer: Self-pay | Admitting: Family Medicine

## 2021-12-06 NOTE — Telephone Encounter (Signed)
Caller Name: Edrick Kins Call back phone #: [phone]  Reason for Call: Grnaddaughter called to see if it was possible for [facility] to be given patches to check his blood sugar, she says that she is not good with needles. States it was at 39 today

## 2021-12-07 ENCOUNTER — Telehealth: Payer: Self-pay | Admitting: Family Medicine

## 2021-12-07 DIAGNOSIS — R3121 Asymptomatic microscopic hematuria: Secondary | ICD-10-CM | POA: Diagnosis not present

## 2021-12-07 DIAGNOSIS — Z9181 History of falling: Secondary | ICD-10-CM

## 2021-12-07 DIAGNOSIS — C61 Malignant neoplasm of prostate: Secondary | ICD-10-CM | POA: Diagnosis not present

## 2021-12-07 NOTE — Telephone Encounter (Signed)
Patients granddaughter aware sent in someone will call them to schedule

## 2021-12-07 NOTE — Telephone Encounter (Signed)
Spoke with patients granddaughter who states that patients glucose was at 4 yesterday but they were able to give orange juice and it went up. She also was asking about patient possibly starting on a Dexcom to monitor his glucose levels but then declined and said that the [place] nurse could prick finger when she comes out to the home.

## 2021-12-07 NOTE — Telephone Encounter (Signed)
Caller Name: Darden Palmer Call back phone #: [phone]  Reason for Call: Pts grandaughter was inquiring about the referral to a [facility] agency. She stated that it had been suggested 2 weeks ago. She feels he is in need of this service.

## 2021-12-09 NOTE — Progress Notes (Signed)
Cardiology Office Note Date:  12/10/2021  Patient ID:  [name], [name] 07-06-1923, MRN [id number] PCP:  [name], MD  Cardiologist:  Dr. Fletcher Anon (PVD) Electrophysiologist: Dr. Lovena Le    Chief Complaint: post ER  History of Present Illness: [name] is a 88 y.o. male with history of stroke (remotely), HTN, DM, tachy-brady w/PPM, AFib, PVD (L popliteal intervention), CKD (IIIB)  He come sin today to be seen for Dr. Lovena Le, last seen by him Nov 2021, was doing well, in AFib w/CVR, was asymptomatic.  Noted presumably via his device some increased HRs though without symptoms, no changes made.  Saw Dr. Fletcher Anon Feb 2022, doing a bit better, with some falls and weight loss the prior year with some discussion perhaps of stopping [place], but  reported more stable at that time with excellent family support, continued. Pt c/o b/l LE discoloration, suspected more likely 2/2 venous insufficiency  Saw PMD 11/30/21, apparently had been in a SN[place] though did not go well, discussed low BPs causing stopping his meds, urology had started [place].  There for a concern about his penile swelling Started on ketoconazole 2%-triamcinolone 0.1% 1:2 cream mixture for balanitis.  12/05/21 saw primary again, reported urinary hesitancy, scrotal swelling, LE edema, pending urology visit 21st Referred to the ER 12/05/21, ER visit with LE/scrotal swelling, no SOB, given/started on a short course of lasix to f/u with already scheduled nephrology and make cardiac appt as well. BUN/Creat 23/1.91 (priors 1.5, 1.93) H/H 9.5/28)  TODAY He is accompanied by his daughter/family He is better, swelling is improved significantly not resolved He denies any kid of SOB No near syncope or syncope. No bleeding or signs of bleeding  He saw urology, they felt he was fine, no hematuria reported No bleeding or signs of bleeding  Device information MD dual chamber PPM implanted 09/26/2004, gen change  04/06/2012   Past Medical History:  Diagnosis Date   Arthritis    Atrial fibrillation ([place])    CKD (chronic kidney disease)    History of hiatal hernia    Hx SBO    Last 2013 all treated conservatively   Hypertension    Presence of permanent cardiac pacemaker    Prostate cancer ([place])    Stroke ([place]) 2017   left facial drooping noted 08/14/2016   Tachycardia-bradycardia syndrome ([place])    Type II diabetes mellitus ([place])    Ventral hernia    x3    Past Surgical History:  Procedure Laterality Date   ABDOMINAL AORTOGRAM W/LOWER EXTREMITY N/A 08/07/2016   Procedure: Abdominal Aortogram w/Lower Extremity;  Surgeon: Wellington Hampshire, MD;  Location: [facility];  Service: Cardiovascular;  Laterality: N/A;   CHOLECYSTECTOMY OPEN  03/31/2001   Dr Lindon Romp   COLON SURGERY     EXPLORATORY LAPAROTOMY  08/2004   Archie Endo 10/01/2010   HEMORRHOID SURGERY     HERNIA REPAIR     HIATAL HERNIA REPAIR  2002   INCISIONAL HERNIA REPAIR  08/2004   Archie Endo 10/01/2010   INSERT / REPLACE / REMOVE PACEMAKER     PERIPHERAL VASCULAR BALLOON ANGIOPLASTY  08/07/2016   Procedure: Peripheral Vascular Balloon Angioplasty;  Surgeon: Wellington Hampshire, MD;  Location: [facility];  Service: Cardiovascular;;  left popliteal artery   PERIPHERAL VASCULAR INTERVENTION  08/14/2016   popliteal artery/notes 08/14/2016   PERIPHERAL VASCULAR INTERVENTION Left 08/14/2016   Procedure: Peripheral Vascular Intervention;  Surgeon: Wellington Hampshire, MD;  Location: [facility];  Service: Cardiovascular;  Laterality: Left;  popliteal artery   PERMANENT PACEMAKER GENERATOR CHANGE N/A 04/06/2012   Procedure: PERMANENT PACEMAKER GENERATOR CHANGE;  Surgeon: Evans Lance, MD;  Location: [facility];  Service: Cardiovascular;  Laterality: N/A;   SMALL INTESTINE SURGERY  09/16/2004   SBO resection, VH repair    Current Outpatient Medications  Medication Sig Dispense Refill   atorvastatin (LIPITOR) 10 MG tablet Take 1  tablet by mouth at bedtime.     docusate sodium (COLACE) 100 MG capsule Take 1 capsule (100 mg total) by mouth 2 (two) times daily. 180 capsule 0   ergocalciferol (VITAMIN D2) 1.25 MG (50000 UT) capsule Take 50,000 Units by mouth once a week.     ferrous sulfate (FEROSUL) 325 (65 FE) MG tablet Take 1 tablet (325 mg total) by mouth every other day. 90 tablet 2   folic acid (FOLVITE) 1 MG tablet Take 1 tablet (1 mg total) by mouth daily. 90 tablet 3   JARDIANCE 25 MG TABS tablet Take by mouth.     ketoconazole (NIZORAL) 2 % cream Apply topically.     ketoconazole 2%-triamcinolone 0.1% 1:2 cream mixture Apply topically daily. Apply to foreskin daily for 10 days. 45 g 1   latanoprost (XALATAN) 0.005 % ophthalmic solution Place 1 drop into both eyes nightly.     losartan (COZAAR) 100 MG tablet Take 1 tablet (100 mg total) by mouth daily. 90 tablet 0   memantine (NAMENDA) 10 MG tablet Take 1 tablet (10 mg total) by mouth 2 (two) times daily. 180 tablet 3   metoprolol succinate (TOPROL-XL) 50 MG 24 hr tablet Take 50 mg by mouth daily.     Nutritional Supplements (GLUCERNA ADVANCE SHAKE PO) Take by mouth.     pantoprazole (PROTONIX) 40 MG tablet TAKE 1 TABLET(40 MG) BY MOUTH DAILY 90 tablet 1   polyethylene glycol powder (GLYCOLAX/MIRALAX) 17 GM/SCOOP powder Take 17 g by mouth 2 (two) times daily as needed. 3350 g 1   QUEtiapine (SEROQUEL) 25 MG tablet Take 25 mg by mouth at bedtime.     tamsulosin (FLOMAX) 0.4 MG CAPS capsule Take 1 capsule (0.4 mg total) by mouth daily. 90 capsule 3   vitamin B-12 (CYANOCOBALAMIN) 500 MCG tablet Take 500 mcg by mouth daily.     XARELTO 15 MG TABS tablet TAKE 1 TABLET(15 MG) BY MOUTH DAILY WITH SUPPER 30 tablet 3   furosemide (LASIX) 20 MG tablet Take 1 tablet (20 mg total) by mouth daily. 90 tablet 1   No current facility-administered medications for this visit.    Allergies:   Patient has no known allergies.   Social History:  The patient  reports that he quit  smoking about 66 years ago. His smoking use included cigarettes. He has a 22.50 pack-year smoking history. He has been exposed to tobacco smoke. He quit smokeless tobacco use about 69 years ago.  His smokeless tobacco use included chew. He reports that he does not drink alcohol and does not use drugs.   Family History:  The patient's family history includes Cancer in his brother; Pneumonia in his father; Stroke in his brother and sister.  ROS:  Please see the history of present illness.    All other systems are reviewed and otherwise negative.   PHYSICAL EXAM:  VS:  BP 110/80   Pulse 64   Ht '5\' 11"'$  (1.803 m)   Wt 155 lb 6.4 oz (70.5 kg)   SpO2 97%   BMI  21.67 kg/m  BMI: Body mass index is 21.67 kg/m. Well nourished, well developed, in no acute distress HEENT: normocephalic, atraumatic Neck: no JVD, carotid bruits or masses Cardiac:  irreg-irreg; no significant murmurs, no rubs, or gallops Lungs:  CTA b/l, no wheezing, rhonchi or rales Abd: soft, nontender MS: no deformity or age appropriate atrophy Ext: 1+ to below the knee b/l edema Skin: warm and dry, no rash Neuro:  No gross deficits appreciated Psych: euthymic mood, full affect  PPM site is stable, no tethering or discomfort   EKG:  not done today  Device interrogation done today and reviewed by myself:  Battery estimate is 89moto ERI, lead measurements are good 14.3% VP only   07/13/20: ABI/US Summary:  Right: Resting right ankle-brachial index is within normal range. No  evidence of significant right lower extremity arterial disease. The right  toe-brachial index is normal.   TBIs increased by .24.   Left: Resting left ankle-brachial index is within normal range. No  evidence of significant left lower extremity arterial disease. The left  toe-brachial index is normal.   TBIs increased by .11.   Suggest follow up study in 12 months.  Art [place] Summary:  Left: No significant change as compared to previous  study. Mild  heterogenous plaque throughout.  Patent distal popliteal artery to mid TPT stent without evidence of  stenosis.  Patent distal TPT to proximal PTA stent with stable elevated velocities in  the proximal stent segment suggesting 50-99% restenosis; stenosis based on  velocities.    11/08/2016: TTE Study Conclusions  - Left ventricle: The cavity size was severely reduced. Wall    thickness was increased in a pattern of severe LVH. Systolic    function was normal. The estimated ejection fraction was in the    range of 50% to 55%.  - Left atrium: The atrium was severely dilated.  - Right atrium: The atrium was severely dilated.  Recent Labs: 07/29/2021: ALT 12 08/08/2021: TSH 3.350 11/30/2021: Magnesium 1.7 12/05/2021: B Natriuretic Peptide 531.7; BUN 23; Creatinine, Ser 1.91; Hemoglobin 9.5; Platelets 155; Potassium 4.3; Sodium 143  No results found for requested labs within last 365 days.   Estimated Creatinine Clearance: 23.1 mL/min (A) (by C-G formula based on SCr of 1.91 mg/dL (H)).   Wt Readings from Last 3 Encounters:  12/10/21 155 lb 6.4 oz (70.5 kg)  12/05/21 160 lb 15 oz (73 kg)  12/05/21 161 lb 9.6 oz (73.3 kg)     Other studies reviewed: Additional studies/records reviewed today include: summarized above  ASSESSMENT AND PLAN:  PPM Intact function, no programming changes made Nearing ERI  Permanent AFib CHA2DS2Vasc is 7, on Xarelto appropriately dosed HR histograms are difficultto get a sense of his rates, data goes back to Nov 2021. Rate is controlled today Data should clear today and get better HR data at his next visit  PVD Denies claudication   volume OL He has good response to low dose lasix, for now will continue Update labs today Update his echo  5.   HTN Looks good  Disposition: F/u with me in a month again, sooner if needed  Current medicines are reviewed at length with the patient today.  The patient did not have any concerns  regarding medicines.  SVenetia Night PA-C 12/10/2021 1:13 PM     [place] [phone](office)  [phone](fax)

## 2021-12-10 ENCOUNTER — Encounter: Payer: Self-pay | Admitting: Physician Assistant

## 2021-12-10 ENCOUNTER — Ambulatory Visit (INDEPENDENT_AMBULATORY_CARE_PROVIDER_SITE_OTHER): Payer: Medicare PPO | Admitting: Physician Assistant

## 2021-12-10 VITALS — BP 110/80 | HR 64 | Ht 71.0 in | Wt 155.4 lb

## 2021-12-10 DIAGNOSIS — I5033 Acute on chronic diastolic (congestive) heart failure: Secondary | ICD-10-CM

## 2021-12-10 DIAGNOSIS — I4821 Permanent atrial fibrillation: Secondary | ICD-10-CM

## 2021-12-10 DIAGNOSIS — Z950 Presence of cardiac pacemaker: Secondary | ICD-10-CM

## 2021-12-10 DIAGNOSIS — I482 Chronic atrial fibrillation, unspecified: Secondary | ICD-10-CM | POA: Diagnosis not present

## 2021-12-10 DIAGNOSIS — I10 Essential (primary) hypertension: Secondary | ICD-10-CM

## 2021-12-10 DIAGNOSIS — I5032 Chronic diastolic (congestive) heart failure: Secondary | ICD-10-CM | POA: Diagnosis not present

## 2021-12-10 LAB — CUP PACEART INCLINIC DEVICE CHECK
Battery Impedance: 6537 Ohm
Battery Remaining Longevity: 3 mo
Battery Voltage: 2.64 V
Brady Statistic RV Percent Paced: 14 %
Date Time Interrogation Session: 20230724175849
Implantable Lead Implant Date: 20060510
Implantable Lead Location: 753860
Implantable Lead Model: 5076
Implantable Pulse Generator Implant Date: 20131118
Lead Channel Impedance Value: 0 Ohm
Lead Channel Impedance Value: 413 Ohm
Lead Channel Pacing Threshold Amplitude: 1.25 V
Lead Channel Pacing Threshold Amplitude: 1.25 V
Lead Channel Pacing Threshold Pulse Width: 0.4 ms
Lead Channel Pacing Threshold Pulse Width: 0.4 ms
Lead Channel Sensing Intrinsic Amplitude: 15.67 mV
Lead Channel Setting Pacing Amplitude: 2.5 V
Lead Channel Setting Pacing Pulse Width: 0.4 ms
Lead Channel Setting Sensing Sensitivity: 5.6 mV

## 2021-12-10 LAB — BASIC METABOLIC PANEL
BUN/Creatinine Ratio: 11 (ref 10–24)
BUN: 21 mg/dL (ref 10–36)
CO2: 21 mmol/L (ref 20–29)
Calcium: 8 mg/dL — ABNORMAL LOW (ref 8.6–10.2)
Chloride: 108 mmol/L — ABNORMAL HIGH (ref 96–106)
Creatinine, Ser: 1.86 mg/dL — ABNORMAL HIGH (ref 0.76–1.27)
Glucose: 153 mg/dL — ABNORMAL HIGH (ref 70–99)
Potassium: 4.6 mmol/L (ref 3.5–5.2)
Sodium: 141 mmol/L (ref 134–144)
eGFR: 33 mL/min/{1.73_m2} — ABNORMAL LOW (ref >59)

## 2021-12-10 MED ORDER — FUROSEMIDE 20 MG PO TABS: 20.0000 mg | ORAL_TABLET | Freq: Every day | ORAL | 1 refills | Status: DC

## 2021-12-10 NOTE — Patient Instructions (Addendum)
Medication Instructions:  Your physician has recommended you make the following change in your medication:    Lasix 20 Mg-  Take one tablet by mouth daily.    Labwork: You will have lab work drawn today:  BMET  Testing/Procedures: You will have an Echocardiogram scheduled today.  Follow-Up:  Your physician wants you to follow-up in: ONE MONTH with Matthew Standard, PA-C   Remote monitoring is used to monitor your Pacemaker from home. This monitoring reduces the number of office visits required to check your device to one time per year. It allows [place] to keep an eye on the functioning of your device to ensure it is working properly. You are scheduled for a device check from home on 12/31/21. You may send your transmission at any time that day. If you have a wireless device, the transmission will be sent automatically. After your physician reviews your transmission, you will receive a postcard with your next transmission date.  Any Other Special Instructions Will Be Listed Below (If Applicable).  If you need a refill on your cardiac medications before your next appointment, please call your pharmacy.   Important Information About [place]      Echocardiogram An echocardiogram is a test that uses sound waves (ultrasound) to produce images of the heart. Images from an echocardiogram can provide important information about: Heart size and shape. The size and thickness and movement of your heart's walls. Heart muscle function and strength. Heart valve function or if you have stenosis. Stenosis is when the heart valves are too narrow. If blood is flowing backward through the heart valves (regurgitation). A tumor or infectious growth around the heart valves. Areas of heart muscle that are not working well because of poor blood flow or injury from a heart attack. Aneurysm detection. An aneurysm is a weak or damaged part of an artery wall. The wall bulges out from the normal force of blood pumping  through the body. Tell a health care provider about: Any allergies you have. All medicines you are taking, including vitamins, herbs, eye drops, creams, and over-the-counter medicines. Any blood disorders you have. Any surgeries you have had. Any medical conditions you have. Whether you are pregnant or may be pregnant. What are the risks? Generally, this is a safe test. However, problems may occur, including an allergic reaction to dye (contrast) that may be used during the test. What happens before the test? No specific preparation is needed. You may eat and drink normally. What happens during the test?  You will take off your clothes from the waist up and put on a hospital gown. Electrodes or electrocardiogram (ECG)patches may be placed on your chest. The electrodes or patches are then connected to a device that monitors your heart rate and rhythm. You will lie down on a table for an ultrasound exam. A gel will be applied to your chest to help sound waves pass through your skin. A handheld device, called a transducer, will be pressed against your chest and moved over your heart. The transducer produces sound waves that travel to your heart and bounce back (or "echo" back) to the transducer. These sound waves will be captured in real-time and changed into images of your heart that can be viewed on a video monitor. The images will be recorded on a computer and reviewed by your health care provider. You may be asked to change positions or hold your breath for a short time. This makes it easier to get different views or better  views of your heart. In some cases, you may receive contrast through an IV in one of your veins. This can improve the quality of the pictures from your heart. The procedure may vary among health care providers and hospitals. What can I expect after the test? You may return to your normal, everyday life, including diet, activities, and medicines, unless your health care  provider tells you not to do that. Follow these instructions at home: It is up to you to get the results of your test. Ask your health care provider, or the department that is doing the test, when your results will be ready. Keep all follow-up visits. This is important. Summary An echocardiogram is a test that uses sound waves (ultrasound) to produce images of the heart. Images from an echocardiogram can provide important information about the size and shape of your heart, heart muscle function, heart valve function, and other possible heart problems. You do not need to do anything to prepare before this test. You may eat and drink normally. After the echocardiogram is completed, you may return to your normal, everyday life, unless your health care provider tells you not to do that. This information is not intended to replace advice given to you by your health care provider. Make sure you discuss any questions you have with your health care provider. Document Revised: 01/17/2021 Document Reviewed: 12/28/2019 Elsevier Patient Education  [place].

## 2021-12-10 NOTE — Progress Notes (Signed)
Remote pacemaker transmission.   

## 2021-12-11 ENCOUNTER — Ambulatory Visit (INDEPENDENT_AMBULATORY_CARE_PROVIDER_SITE_OTHER): Payer: Medicare PPO | Admitting: Cardiovascular Disease

## 2021-12-11 ENCOUNTER — Other Ambulatory Visit: Payer: Self-pay

## 2021-12-11 ENCOUNTER — Encounter: Payer: Self-pay | Admitting: Cardiovascular Disease

## 2021-12-11 VITALS — BP 106/68 | HR 84 | Ht 71.0 in | Wt 154.0 lb

## 2021-12-11 DIAGNOSIS — I739 Peripheral vascular disease, unspecified: Secondary | ICD-10-CM

## 2021-12-11 DIAGNOSIS — I5032 Chronic diastolic (congestive) heart failure: Secondary | ICD-10-CM

## 2021-12-11 DIAGNOSIS — I482 Chronic atrial fibrillation, unspecified: Secondary | ICD-10-CM

## 2021-12-11 DIAGNOSIS — I10 Essential (primary) hypertension: Secondary | ICD-10-CM | POA: Diagnosis not present

## 2021-12-11 NOTE — Patient Instructions (Signed)
Medication Instructions:  Your physician recommends that you continue on your current medications as directed. Please refer to the Current Medication list given to you today.  *If you need a refill on your cardiac medications before your next appointment, please call your pharmacy*  Testing/Procedures: Your physician has requested that you have an ankle brachial index (ABI). During this test an ultrasound and blood pressure cuff are used to evaluate the arteries that supply the arms and legs with blood. Allow thirty minutes for this exam. There are no restrictions or special instructions.  Your physician has requested that you have a lower extremity arterial duplex. This test is an ultrasound of the arteries in the legs. It looks at arterial blood flow in the legs. Allow one hour for Lower Arterial scans. There are no restrictions or special instructions  Follow-Up: At [facility], you and your health needs are our priority.  As part of our continuing mission to provide you with exceptional heart care, we have created designated Provider Care Teams.  These Care Teams include your primary Cardiologist (physician) and Advanced Practice Providers (APPs -  Physician Assistants and Nurse Practitioners) who all work together to provide you with the care you need, when you need it.  We recommend signing up for the patient portal called "MyChart".  Sign up information is provided on this After Visit Summary.  MyChart is used to connect with patients for Virtual Visits (Telemedicine).  Patients are able to view lab/test results, encounter notes, upcoming appointments, etc.  Non-urgent messages can be sent to your provider as well.   To learn more about what you can do with MyChart, go to [email or web address].    Your next appointment:   6 month(s)  The format for your next appointment:   In Person  Provider:   Dr. Fletcher Anon   Important Information About Sugar

## 2021-12-11 NOTE — Progress Notes (Signed)
Cardiology Office Note   Date:  12/11/2021   ID:  [name], DOB 1923/12/11, MRN [id number]  PCP:  [name], MD  Cardiologist:  Dr. Lovena Le   Chief Complaint  Patient presents with   Follow-up    6 months.      History of Present Illness: [name] is a 88 y.o. male who is here today for a follow-up visit regarding peripheral arterial disease.The patient has known history of chronic atrial fibrillation on long-term anticoagulation with Xarelto, symptomatic bradycardia status post permanent pacemaker placement, previous stroke one day after running out of Xarelto and diet-controlled diabetes. He was seen in 2018 for ulceration on the left distal fourth toe with painful dark discoloration.  Vascular studies showed normal ABI on the right side and mildly reduced on the left side at 0.93. Duplex showed occlusion of the distal left popliteal artery and tibial peroneal trunk. Angiography in March 2018 showed no significant aortoiliac disease: Occluded left popliteal artery with reconstitution in the distal TP trunk with patent posterior tibial and peroneal arteries. The patient  underwent successful complex endovascular intervention of the occluded left popliteal artery and TP trunk using a retrograde access via the left posterior tibial artery.  Most recent vascular studies in February 2022 showed normal ABI bilaterally.  Duplex showed patent stent in the left popliteal and tibioperoneal trunk with no obstructive disease.  He had an emergency room visit on July 9 for worsening shortness of breath and significant leg and scrotal edema.  He was started on small dose furosemide 20 mg once daily and has responded well.  He is scheduled for an echocardiogram in August.  He does have increased discoloration in both legs likely due to chronic venous insufficiency.  He reports some mild discomfort in legs with walking.   Past Medical History:  Diagnosis Date    Arthritis    Atrial fibrillation ([place])    CKD (chronic kidney disease)    History of hiatal hernia    Hx SBO    Last 2013 all treated conservatively   Hypertension    Presence of permanent cardiac pacemaker    Prostate [facility])    Stroke ([place]) 2017   left facial drooping noted 08/14/2016   Tachycardia-bradycardia syndrome ([place])    Type II diabetes mellitus ([place])    Ventral hernia    x3    Past Surgical History:  Procedure Laterality Date   ABDOMINAL AORTOGRAM W/LOWER EXTREMITY N/A 08/07/2016   Procedure: Abdominal Aortogram w/Lower Extremity;  Surgeon: Wellington Hampshire, MD;  Location: [place] [facility];  Service: Cardiovascular;  Laterality: N/A;   CHOLECYSTECTOMY OPEN  03/31/2001   Dr Lindon Romp   COLON SURGERY     EXPLORATORY LAPAROTOMY  08/2004   Archie Endo 10/01/2010   HEMORRHOID SURGERY     HERNIA REPAIR     HIATAL HERNIA REPAIR  2002   INCISIONAL HERNIA REPAIR  08/2004   Archie Endo 10/01/2010   INSERT / REPLACE / REMOVE PACEMAKER     PERIPHERAL VASCULAR BALLOON ANGIOPLASTY  08/07/2016   Procedure: Peripheral Vascular Balloon Angioplasty;  Surgeon: Wellington Hampshire, MD;  Location: [place] [facility];  Service: Cardiovascular;;  left popliteal artery   PERIPHERAL VASCULAR INTERVENTION  08/14/2016   popliteal artery/notes 08/14/2016   PERIPHERAL VASCULAR INTERVENTION Left 08/14/2016   Procedure: Peripheral Vascular Intervention;  Surgeon: Wellington Hampshire, MD;  Location: [place] [facility];  Service: Cardiovascular;  Laterality: Left;  popliteal artery  PERMANENT PACEMAKER GENERATOR CHANGE N/A 04/06/2012   Procedure: PERMANENT PACEMAKER GENERATOR CHANGE;  Surgeon: Evans Lance, MD;  [facility]: [facility];  Service: Cardiovascular;  Laterality: N/A;   SMALL INTESTINE SURGERY  09/16/2004   SBO resection, VH repair     Current Outpatient Medications  Medication Sig Dispense Refill   atorvastatin (LIPITOR) 10 MG tablet Take 1 tablet by mouth at bedtime.     docusate sodium (COLACE)  100 MG capsule Take 1 capsule (100 mg total) by mouth 2 (two) times daily. 180 capsule 0   ergocalciferol (VITAMIN D2) 1.25 MG (50000 UT) capsule Take 50,000 Units by mouth once a week.     ferrous sulfate (FEROSUL) 325 (65 FE) MG tablet Take 1 tablet (325 mg total) by mouth every other day. 90 tablet 2   folic acid (FOLVITE) 1 MG tablet Take 1 tablet (1 mg total) by mouth daily. 90 tablet 3   furosemide (LASIX) 20 MG tablet Take 1 tablet (20 mg total) by mouth daily. 90 tablet 1   JARDIANCE 25 MG TABS tablet Take by mouth.     ketoconazole (NIZORAL) 2 % cream Apply topically.     ketoconazole 2%-triamcinolone 0.1% 1:2 cream mixture Apply topically daily. Apply to foreskin daily for 10 days. 45 g 1   latanoprost (XALATAN) 0.005 % ophthalmic solution Place 1 drop into both eyes nightly.     losartan (COZAAR) 100 MG tablet Take 1 tablet (100 mg total) by mouth daily. 90 tablet 0   memantine (NAMENDA) 10 MG tablet Take 1 tablet (10 mg total) by mouth 2 (two) times daily. 180 tablet 3   metoprolol succinate (TOPROL-XL) 50 MG 24 hr tablet Take 50 mg by mouth daily.     Nutritional Supplements (GLUCERNA ADVANCE SHAKE PO) Take by mouth.     pantoprazole (PROTONIX) 40 MG tablet TAKE 1 TABLET(40 MG) BY MOUTH DAILY 90 tablet 1   polyethylene glycol powder (GLYCOLAX/MIRALAX) 17 GM/SCOOP powder Take 17 g by mouth 2 (two) times daily as needed. 3350 g 1   QUEtiapine (SEROQUEL) 25 MG tablet Take 25 mg by mouth at bedtime.     tamsulosin (FLOMAX) 0.4 MG CAPS capsule Take 1 capsule (0.4 mg total) by mouth daily. 90 capsule 3   vitamin B-12 (CYANOCOBALAMIN) 500 MCG tablet Take 500 mcg by mouth daily.     XARELTO 15 MG TABS tablet TAKE 1 TABLET(15 MG) BY MOUTH DAILY WITH SUPPER 30 tablet 3   No current facility-administered medications for this visit.    Allergies:   Patient has no known allergies.    Social History:  The patient  reports that he quit smoking about 66 years ago. His smoking use included  cigarettes. He has a 22.50 pack-year smoking history. He has been exposed to tobacco smoke. He quit smokeless tobacco use about 69 years ago.  His smokeless tobacco use included chew. He reports that he does not drink alcohol and does not use drugs.   Family History:  The patient's family history includes Cancer in his brother; Pneumonia in his father; Stroke in his brother and sister.    ROS:  Please see the history of present illness.   Otherwise, review of systems are positive for none.   All other systems are reviewed and negative.    PHYSICAL EXAM: VS:  BP 106/68 (BP Location: Right Arm, Patient Position: Sitting, Cuff Size: Normal)   Pulse 84   Ht '5\' 11"'$  (1.803 m)   Wt 154 lb (69.9  kg)   BMI 21.48 kg/m  , BMI Body mass index is 21.48 kg/m. GEN: Well nourished, well developed, in no acute distress  HEENT: normal  Neck: no JVD, carotid bruits, or masses Cardiac: Irregularly irregular; no murmurs, rubs, or gallops, mild to moderate bilateral swelling with chronic stasis. Respiratory:  clear to auscultation bilaterally, normal work of breathing GI: soft, nontender, nondistended, + BS MS: no deformity or atrophy  Skin: warm and dry, no rash Neuro:  Strength and sensation are intact Psych: euthymic mood, full affect   EKG:  EKG is  not ordered today.   Recent Labs: 07/29/2021: ALT 12 08/08/2021: TSH 3.350 11/30/2021: Magnesium 1.7 12/05/2021: B Natriuretic Peptide 531.7; Hemoglobin 9.5; Platelets 155 12/10/2021: BUN 21; Creatinine, Ser 1.86; Potassium 4.6; Sodium 141    Lipid Panel    Component Value Date/Time   CHOL 122 07/23/2015 1254   TRIG 55 07/23/2015 1254   HDL 43 07/23/2015 1254   CHOLHDL 2.8 07/23/2015 1254   VLDL 11 07/23/2015 1254   LDLCALC 68 07/23/2015 1254      Wt Readings from Last 3 Encounters:  12/11/21 154 lb (69.9 kg)  12/10/21 155 lb 6.4 oz (70.5 kg)  12/05/21 160 lb 15 oz (73 kg)           No data to display            ASSESSMENT AND  PLAN:  1.  Peripheral arterial disease with previous critical limb ischemia status post successful complex endovascular intervention of the occluded left popliteal artery and TP trunk with placement of 2 stents.  Most recent Doppler studies showed patent stents.  Suspect that the current discoloration is likely due to chronic venous insufficiency and not progression of peripheral arterial disease.  Nonetheless, he does complain of bilateral leg pain with walking and thus I requested a follow-up ABI and lower extremity arterial duplex.  2. Chronic atrial fibrillation: His rate is controlled and he is on long-term anticoagulation with Xarelto.  Ventricular rate is controlled with Toprol.  3.  Essential hypertension: Blood pressure is controlled.  4.  Chronic diastolic heart failure: He continues to be volume overloaded but seems to be improving with small dose furosemide 20 mg daily.  He does have underlying chronic kidney disease and might ultimately require a higher dose.   Disposition:   FU with me in 6 months  Signed,  Kathlyn Sacramento, MD  12/11/2021 11:05 AM    [place]

## 2021-12-18 ENCOUNTER — Ambulatory Visit (HOSPITAL_COMMUNITY)
Admission: RE | Admit: 2021-12-18 | Discharge: 2021-12-18 | Disposition: A | Discharge status: Home / Self Care | Payer: Medicare PPO | Source: Ambulatory Visit | Attending: Physician Assistant | Admitting: Physician Assistant

## 2021-12-18 ENCOUNTER — Other Ambulatory Visit: Payer: BC Managed Care – PPO

## 2021-12-18 DIAGNOSIS — I082 Rheumatic disorders of both aortic and tricuspid valves: Secondary | ICD-10-CM | POA: Diagnosis not present

## 2021-12-18 DIAGNOSIS — I110 Hypertensive heart disease with heart failure: Secondary | ICD-10-CM | POA: Diagnosis not present

## 2021-12-18 DIAGNOSIS — I5031 Acute diastolic (congestive) heart failure: Secondary | ICD-10-CM | POA: Insufficient documentation

## 2021-12-18 DIAGNOSIS — Z950 Presence of cardiac pacemaker: Secondary | ICD-10-CM

## 2021-12-18 DIAGNOSIS — I3139 Other pericardial effusion (noninflammatory): Secondary | ICD-10-CM | POA: Diagnosis not present

## 2021-12-18 DIAGNOSIS — I5032 Chronic diastolic (congestive) heart failure: Secondary | ICD-10-CM | POA: Diagnosis not present

## 2021-12-18 DIAGNOSIS — I5033 Acute on chronic diastolic (congestive) heart failure: Secondary | ICD-10-CM | POA: Diagnosis not present

## 2021-12-18 DIAGNOSIS — E119 Type 2 diabetes mellitus without complications: Secondary | ICD-10-CM | POA: Diagnosis not present

## 2021-12-18 DIAGNOSIS — I4821 Permanent atrial fibrillation: Secondary | ICD-10-CM

## 2021-12-18 LAB — BASIC METABOLIC PANEL
BUN/Creatinine Ratio: 12 (ref 10–24)
BUN: 24 mg/dL (ref 10–36)
CO2: 26 mmol/L (ref 20–29)
Calcium: 7.7 mg/dL — ABNORMAL LOW (ref 8.6–10.2)
Chloride: 105 mmol/L (ref 96–106)
Creatinine, Ser: 1.95 mg/dL — ABNORMAL HIGH (ref 0.76–1.27)
Glucose: 172 mg/dL — ABNORMAL HIGH (ref 70–99)
Potassium: 4.3 mmol/L (ref 3.5–5.2)
Sodium: 142 mmol/L (ref 134–144)
eGFR: 31 mL/min/{1.73_m2} — ABNORMAL LOW (ref >59)

## 2021-12-18 LAB — ECHOCARDIOGRAM COMPLETE
Area-P 1/2: 4.21 cm2
Calc EF: 58.5 %
S' Lateral: 3.1 cm
Single Plane A2C EF: 63.4 %
Single Plane A4C EF: 52.6 %

## 2021-12-24 ENCOUNTER — Encounter: Payer: Self-pay | Admitting: Neurology

## 2021-12-24 ENCOUNTER — Telehealth: Payer: Self-pay | Admitting: Family Medicine

## 2021-12-24 ENCOUNTER — Telehealth: Payer: Self-pay | Admitting: *Deleted

## 2021-12-24 DIAGNOSIS — G4700 Insomnia, unspecified: Secondary | ICD-10-CM

## 2021-12-24 DIAGNOSIS — F03911 Unspecified dementia, unspecified severity, with agitation: Secondary | ICD-10-CM

## 2021-12-24 NOTE — Telephone Encounter (Signed)
Please advise message below  °

## 2021-12-24 NOTE — Telephone Encounter (Signed)
I have put a green charge slip in your folder.

## 2021-12-24 NOTE — Telephone Encounter (Signed)
Caller Name: Edrick Kins Call back phone #: [phone]  Reason for Call: Granddaughter is worried about pt, she has noticed that since taking Adair Patter he has become extremely combative. He is argumentative and is starting to get physical. Asked if there was something that could be given to him to calm him down

## 2021-12-24 NOTE — Telephone Encounter (Signed)
-----   Message from [place], [place] sent at 12/19/2021 11:43 AM EDT ----- Creat remains elevated though not significant changed from last few lab draws.   Please ask them if his swelling continues to improve?  Resolved? If it is we will continue as is, if not let me know please

## 2021-12-24 NOTE — Telephone Encounter (Signed)
Lvm for Grand Daughter to call clinic back for results.

## 2021-12-25 ENCOUNTER — Other Ambulatory Visit: Payer: Self-pay | Admitting: *Deleted

## 2021-12-25 ENCOUNTER — Telehealth: Payer: Self-pay | Admitting: Family Medicine

## 2021-12-25 ENCOUNTER — Telehealth: Payer: Self-pay | Admitting: *Deleted

## 2021-12-25 ENCOUNTER — Ambulatory Visit (HOSPITAL_COMMUNITY)
Admission: RE | Admit: 2021-12-25 | Payer: Medicare PPO | Source: Ambulatory Visit | Attending: Cardiovascular Disease | Admitting: Cardiovascular Disease

## 2021-12-25 DIAGNOSIS — F03911 Unspecified dementia, unspecified severity, with agitation: Secondary | ICD-10-CM | POA: Insufficient documentation

## 2021-12-25 DIAGNOSIS — Z79899 Other long term (current) drug therapy: Secondary | ICD-10-CM

## 2021-12-25 MED ORDER — HALOPERIDOL 2 MG PO TABS: 2.0000 mg | ORAL_TABLET | Freq: Two times a day (BID) | ORAL | 1 refills | Status: DC

## 2021-12-25 NOTE — Telephone Encounter (Signed)
Pt granddaughter called and stated that the heartcare nurse at [facility] home health would like you to start taking mr Ayer blood once a week to keep check on his kidney.([employer])

## 2021-12-25 NOTE — Telephone Encounter (Signed)
Spoke with patients granddaughter who verbally understood that it was okay to stop Quetiapine. Start Haldol '2mg'$  taken twice daily

## 2021-12-25 NOTE — Telephone Encounter (Signed)
Lvm for Dtr to call back for recommendations.

## 2021-12-25 NOTE — Telephone Encounter (Signed)
Please advise message below would patient be able to come in for weekly labs or should they come in for follow up? Next appointment October 2023

## 2021-12-25 NOTE — Telephone Encounter (Signed)
Forms received pending to be viewed and signed. Returned patients granddaughter call to inform of medication changes, no answer LMTCB

## 2021-12-26 ENCOUNTER — Other Ambulatory Visit: Payer: Self-pay | Admitting: Neurology

## 2021-12-26 DIAGNOSIS — R451 Restlessness and agitation: Secondary | ICD-10-CM

## 2021-12-26 MED ORDER — DIVALPROEX SODIUM 125 MG PO CSDR: 250.0000 mg | DELAYED_RELEASE_CAPSULE | Freq: Two times a day (BID) | ORAL | Status: DC

## 2021-12-26 NOTE — Telephone Encounter (Signed)
Spoke with patients granddaughter scheduled appointment for follow up.

## 2021-12-26 NOTE — Telephone Encounter (Signed)
Called patient granddaughter to inform of message below, no answer LMTCB

## 2021-12-28 ENCOUNTER — Telehealth: Payer: Self-pay | Admitting: Family Medicine

## 2021-12-28 NOTE — Telephone Encounter (Signed)
Caller Name: [facility] Call back phone #: [phone]  Reason for Call: Would like a verbal approval for extended nursing, once a week for 4 weeks. Increase in BP 80/50 and then 100/70 after physical therapy. She also does not believe he is taking all of his medication

## 2021-12-28 NOTE — Telephone Encounter (Signed)
Tried returning call provided number not a working number tried calling granddaughter to see if she had a number to call. No answer LMTCB

## 2021-12-31 ENCOUNTER — Telehealth: Payer: Self-pay | Admitting: Family Medicine

## 2021-12-31 ENCOUNTER — Observation Stay (HOSPITAL_COMMUNITY)
Admission: EM | Admit: 2021-12-31 | Discharge: 2022-01-01 | Disposition: A | Discharge status: Home / Self Care | Payer: Medicare PPO | Attending: Internal Medicine | Admitting: Internal Medicine

## 2021-12-31 ENCOUNTER — Other Ambulatory Visit: Payer: Self-pay | Admitting: Family Medicine

## 2021-12-31 ENCOUNTER — Encounter (HOSPITAL_COMMUNITY): Payer: Self-pay

## 2021-12-31 ENCOUNTER — Other Ambulatory Visit: Payer: Self-pay

## 2021-12-31 DIAGNOSIS — R451 Restlessness and agitation: Secondary | ICD-10-CM

## 2021-12-31 DIAGNOSIS — E1122 Type 2 diabetes mellitus with diabetic chronic kidney disease: Secondary | ICD-10-CM | POA: Insufficient documentation

## 2021-12-31 DIAGNOSIS — N1832 Chronic kidney disease, stage 3b: Secondary | ICD-10-CM | POA: Diagnosis present

## 2021-12-31 DIAGNOSIS — Z8673 Personal history of transient ischemic attack (TIA), and cerebral infarction without residual deficits: Secondary | ICD-10-CM | POA: Insufficient documentation

## 2021-12-31 DIAGNOSIS — F0390 Unspecified dementia without behavioral disturbance: Secondary | ICD-10-CM | POA: Insufficient documentation

## 2021-12-31 DIAGNOSIS — R0902 Hypoxemia: Secondary | ICD-10-CM | POA: Diagnosis not present

## 2021-12-31 DIAGNOSIS — Z862 Personal history of diseases of the blood and blood-forming organs and certain disorders involving the immune mechanism: Secondary | ICD-10-CM

## 2021-12-31 DIAGNOSIS — I959 Hypotension, unspecified: Secondary | ICD-10-CM | POA: Diagnosis not present

## 2021-12-31 DIAGNOSIS — Z8546 Personal history of malignant neoplasm of prostate: Secondary | ICD-10-CM | POA: Diagnosis not present

## 2021-12-31 DIAGNOSIS — D631 Anemia in chronic kidney disease: Secondary | ICD-10-CM | POA: Insufficient documentation

## 2021-12-31 DIAGNOSIS — Z7984 Long term (current) use of oral hypoglycemic drugs: Secondary | ICD-10-CM | POA: Diagnosis not present

## 2021-12-31 DIAGNOSIS — K5901 Slow transit constipation: Secondary | ICD-10-CM

## 2021-12-31 DIAGNOSIS — Z87891 Personal history of nicotine dependence: Secondary | ICD-10-CM | POA: Diagnosis not present

## 2021-12-31 DIAGNOSIS — I482 Chronic atrial fibrillation, unspecified: Secondary | ICD-10-CM | POA: Diagnosis not present

## 2021-12-31 DIAGNOSIS — M6281 Muscle weakness (generalized): Secondary | ICD-10-CM | POA: Diagnosis not present

## 2021-12-31 DIAGNOSIS — I130 Hypertensive heart and chronic kidney disease with heart failure and stage 1 through stage 4 chronic kidney disease, or unspecified chronic kidney disease: Secondary | ICD-10-CM | POA: Diagnosis not present

## 2021-12-31 DIAGNOSIS — K921 Melena: Secondary | ICD-10-CM | POA: Diagnosis not present

## 2021-12-31 DIAGNOSIS — Z7901 Long term (current) use of anticoagulants: Secondary | ICD-10-CM | POA: Diagnosis not present

## 2021-12-31 DIAGNOSIS — K922 Gastrointestinal hemorrhage, unspecified: Principal | ICD-10-CM | POA: Diagnosis present

## 2021-12-31 DIAGNOSIS — N179 Acute kidney failure, unspecified: Secondary | ICD-10-CM | POA: Diagnosis not present

## 2021-12-31 DIAGNOSIS — N189 Chronic kidney disease, unspecified: Secondary | ICD-10-CM

## 2021-12-31 DIAGNOSIS — R29810 Facial weakness: Secondary | ICD-10-CM | POA: Diagnosis not present

## 2021-12-31 DIAGNOSIS — I5032 Chronic diastolic (congestive) heart failure: Secondary | ICD-10-CM | POA: Insufficient documentation

## 2021-12-31 DIAGNOSIS — R000 Tachycardia, unspecified: Secondary | ICD-10-CM | POA: Diagnosis not present

## 2021-12-31 DIAGNOSIS — I480 Paroxysmal atrial fibrillation: Secondary | ICD-10-CM | POA: Diagnosis present

## 2021-12-31 DIAGNOSIS — I4891 Unspecified atrial fibrillation: Secondary | ICD-10-CM | POA: Diagnosis present

## 2021-12-31 DIAGNOSIS — Z950 Presence of cardiac pacemaker: Secondary | ICD-10-CM | POA: Diagnosis present

## 2021-12-31 DIAGNOSIS — Z79899 Other long term (current) drug therapy: Secondary | ICD-10-CM | POA: Insufficient documentation

## 2021-12-31 DIAGNOSIS — E119 Type 2 diabetes mellitus without complications: Secondary | ICD-10-CM

## 2021-12-31 DIAGNOSIS — R2681 Unsteadiness on feet: Secondary | ICD-10-CM | POA: Insufficient documentation

## 2021-12-31 DIAGNOSIS — I5033 Acute on chronic diastolic (congestive) heart failure: Secondary | ICD-10-CM | POA: Diagnosis present

## 2021-12-31 LAB — CBC WITH DIFFERENTIAL/PLATELET
Abs Immature Granulocytes: 0.02 10*3/uL (ref 0.00–0.07)
Basophils Absolute: 0 10*3/uL (ref 0.0–0.1)
Basophils Relative: 1 %
Eosinophils Absolute: 0.1 10*3/uL (ref 0.0–0.5)
Eosinophils Relative: 1 %
HCT: 22.7 % — ABNORMAL LOW (ref 39.0–52.0)
Hemoglobin: 7.2 g/dL — ABNORMAL LOW (ref 13.0–17.0)
Immature Granulocytes: 0 %
Lymphocytes Relative: 17 %
Lymphs Abs: 1.1 10*3/uL (ref 0.7–4.0)
MCH: 26 pg (ref 26.0–34.0)
MCHC: 31.7 g/dL (ref 30.0–36.0)
MCV: 81.9 fL (ref 80.0–100.0)
Monocytes Absolute: 0.7 10*3/uL (ref 0.1–1.0)
Monocytes Relative: 12 %
Neutro Abs: 4.4 10*3/uL (ref 1.7–7.7)
Neutrophils Relative %: 69 %
Platelets: 148 10*3/uL — ABNORMAL LOW (ref 150–400)
RBC: 2.77 MIL/uL — ABNORMAL LOW (ref 4.22–5.81)
RDW: 16.2 % — ABNORMAL HIGH (ref 11.5–15.5)
WBC: 6.3 10*3/uL (ref 4.0–10.5)
nRBC: 0 % (ref 0.0–0.2)

## 2021-12-31 LAB — COMPREHENSIVE METABOLIC PANEL
ALT: 15 U/L (ref 0–44)
AST: 19 U/L (ref 15–41)
Albumin: 2.9 g/dL — ABNORMAL LOW (ref 3.5–5.0)
Alkaline Phosphatase: 55 U/L (ref 38–126)
Anion gap: 4 — ABNORMAL LOW (ref 5–15)
BUN: 32 mg/dL — ABNORMAL HIGH (ref 8–23)
CO2: 24 mmol/L (ref 22–32)
Calcium: 7.6 mg/dL — ABNORMAL LOW (ref 8.9–10.3)
Chloride: 115 mmol/L — ABNORMAL HIGH (ref 98–111)
Creatinine, Ser: 2.36 mg/dL — ABNORMAL HIGH (ref 0.61–1.24)
GFR, Estimated: 25 mL/min — ABNORMAL LOW (ref >60)
Glucose, Bld: 174 mg/dL — ABNORMAL HIGH (ref 70–99)
Potassium: 4.3 mmol/L (ref 3.5–5.1)
Sodium: 143 mmol/L (ref 135–145)
Total Bilirubin: 0.3 mg/dL (ref 0.3–1.2)
Total Protein: 5.1 g/dL — ABNORMAL LOW (ref 6.5–8.1)

## 2021-12-31 LAB — CBG MONITORING, ED
Glucose-Capillary: 86 mg/dL (ref 70–99)
Glucose-Capillary: 155 mg/dL — ABNORMAL HIGH (ref 70–99)

## 2021-12-31 LAB — PROTIME-INR
INR: 1.6 — ABNORMAL HIGH (ref 0.8–1.2)
Prothrombin Time: 19 seconds — ABNORMAL HIGH (ref 11.4–15.2)

## 2021-12-31 LAB — TSH: TSH: 2.092 u[IU]/mL (ref 0.350–4.500)

## 2021-12-31 LAB — MAGNESIUM: Magnesium: 2 mg/dL (ref 1.7–2.4)

## 2021-12-31 LAB — POC OCCULT BLOOD, ED: Fecal Occult Bld: POSITIVE — AB

## 2021-12-31 MED ORDER — TAMSULOSIN HCL 0.4 MG PO CAPS
0.4000 mg | ORAL_CAPSULE | Freq: Every day | ORAL | Status: DC
  Administered 2022-01-01: 0.4 mg via ORAL
  Filled 2021-12-31: qty 1

## 2021-12-31 MED ORDER — ONDANSETRON HCL 4 MG/2ML IJ SOLN: 4.0000 mg | Freq: Four times a day (QID) | INTRAMUSCULAR | Status: DC | PRN

## 2021-12-31 MED ORDER — ONDANSETRON HCL 4 MG PO TABS: 4.0000 mg | ORAL_TABLET | Freq: Four times a day (QID) | ORAL | Status: DC | PRN

## 2021-12-31 MED ORDER — VITAMIN D (ERGOCALCIFEROL) 1.25 MG (50000 UNIT) PO CAPS: 50000.0000 [IU] | ORAL_CAPSULE | ORAL | Status: DC

## 2021-12-31 MED ORDER — PANTOPRAZOLE 80MG IVPB - SIMPLE MED
80.0000 mg | Freq: Once | INTRAVENOUS | Status: AC
  Administered 2021-12-31: 80 mg via INTRAVENOUS
  Filled 2021-12-31: qty 80

## 2021-12-31 MED ORDER — ACETAMINOPHEN 650 MG RE SUPP: 650.0000 mg | Freq: Four times a day (QID) | RECTAL | Status: DC | PRN

## 2021-12-31 MED ORDER — VITAMIN B-12 1000 MCG PO TABS
500.0000 ug | ORAL_TABLET | Freq: Every day | ORAL | Status: DC
  Administered 2022-01-01: 500 ug via ORAL
  Filled 2021-12-31: qty 1

## 2021-12-31 MED ORDER — METOPROLOL SUCCINATE ER 25 MG PO TB24
12.5000 mg | ORAL_TABLET | Freq: Every day | ORAL | Status: DC
  Administered 2022-01-01: 12.5 mg via ORAL
  Filled 2021-12-31: qty 1

## 2021-12-31 MED ORDER — LATANOPROST 0.005 % OP SOLN
1.0000 [drp] | Freq: Every day | OPHTHALMIC | Status: DC
  Administered 2021-12-31: 1 [drp] via OPHTHALMIC
  Filled 2021-12-31: qty 2.5

## 2021-12-31 MED ORDER — INSULIN ASPART 100 UNIT/ML IJ SOLN: 0.0000 [IU] | Freq: Every day | INTRAMUSCULAR | Status: DC

## 2021-12-31 MED ORDER — HALOPERIDOL 1 MG PO TABS
2.0000 mg | ORAL_TABLET | Freq: Two times a day (BID) | ORAL | Status: DC
Start: 2021-12-31 — End: 2022-01-01
  Administered 2021-12-31 – 2022-01-01 (×2): 2 mg via ORAL
  Filled 2021-12-31 (×4): qty 2

## 2021-12-31 MED ORDER — METOPROLOL SUCCINATE ER 25 MG PO TB24
12.5000 mg | ORAL_TABLET | Freq: Every day | ORAL | Status: DC
Start: 2021-12-31 — End: 2021-12-31

## 2021-12-31 MED ORDER — DIVALPROEX SODIUM 125 MG PO CSDR
250.0000 mg | DELAYED_RELEASE_CAPSULE | Freq: Two times a day (BID) | ORAL | Status: DC
  Administered 2021-12-31 – 2022-01-01 (×2): 250 mg via ORAL
  Filled 2021-12-31 (×2): qty 2

## 2021-12-31 MED ORDER — MEMANTINE HCL 10 MG PO TABS
10.0000 mg | ORAL_TABLET | Freq: Two times a day (BID) | ORAL | Status: DC
  Administered 2021-12-31 – 2022-01-01 (×2): 10 mg via ORAL
  Filled 2021-12-31 (×2): qty 1

## 2021-12-31 MED ORDER — INSULIN ASPART 100 UNIT/ML IJ SOLN: 0.0000 [IU] | Freq: Three times a day (TID) | INTRAMUSCULAR | Status: DC

## 2021-12-31 MED ORDER — ACETAMINOPHEN 325 MG PO TABS: 650.0000 mg | ORAL_TABLET | Freq: Four times a day (QID) | ORAL | Status: DC | PRN

## 2021-12-31 MED ORDER — PANTOPRAZOLE INFUSION (NEW) - SIMPLE MED
8.0000 mg/h | INTRAVENOUS | Status: DC
  Administered 2021-12-31 – 2022-01-01 (×2): 8 mg/h via INTRAVENOUS
  Filled 2021-12-31 (×2): qty 80

## 2021-12-31 NOTE — Telephone Encounter (Signed)
FYI message below.

## 2021-12-31 NOTE — Telephone Encounter (Signed)
Returned call for clarification on Patch patients granddaughter is ask for. No answer LMTCB

## 2021-12-31 NOTE — Consult Note (Signed)
[facility] Consult: 4:40 PM 12/31/2021  LOS: 0 days    Referring Provider: Dr Eulogio Bear  Primary Care Physician:  [name], MD Primary Gastroenterologist:  Dr. Lyla Son ~ 88 y ago.       Reason for Consultation:  FOBT + anemia.     HPI: [name] is a 88 y.o. male.  PMH A-fib.  Diabetes.  Ventral hernias.  CVA.  Chronic Xarelto.  Previous open cholecystectomy.  Repair of hiatal hernia.  Exploratory laparotomy with small bowel resection and incisional hernia repair, date not apparent. 1989 colonoscopy.  Sigmoid diverticulosis.  Several, small, flat hyperplastic appearing polyps in the sigmoid and rectum were cauterized, no biopsies performed. Noncontrast CTAP in May 2023 showed High-grade SBO due to right paraumbilical ventral hernia which contained a loop of bowel.  No evidence for intestinal ischemia.  Patient also had 2 additional epigastric ventral hernias on the right which also contained a bowel but were not obstructing.  There was a mild HH.  He had chronic pancreatitis with new calculus in the pancreatic body causing ductal dilatation in the tail of the pancreas.  No acute pancreatitis or mass. On 07/29/2021 patient underwent bedside hernia reduction by ED physician Dr Varney Biles. NGT placed.  At the time family preferred no surgical intervention. General surgery, Dr. Kieth Brightly saw pt and since the hernia had easily reduced, they signed off.  Sent to ER after home health nurse found heart rate of 137 and left facial droop.  Patient was not symptomatic from this.  EMS arrived to transport him and pulse then was in the 80s to 90s.  Subsequent observation by the grandson said that the facial droop was/is pt's baseline.  Patient feels great. Vitals stable.   Labs are showing Hb  7.2.. 8, 10.6 in 04/2021, 11.2 in mid March 2023, 9.5 in [facility] July 2023.  MCV 82.  Platelets 148 .Marland KitchenMarland Kitchen 171 K, normal WBCs.  INR 1.6. LFTs, lipase normal FOBT positive. BUN/creatinine 36 2/2.3, GFR 25.  This represents a decline compared with 2 weeks ago.  Denies n/v, bloody or melenic stools, abd pain.  BM's at leat qod if not daily.  No change bowel pattern, appearance.  Appetite good.  Had bee poor during stint at SNF and lost down to 135# but says now back up to 160#, 154# by current measure.  No swelling.  No NSAIDs.    Lives at home.  Family members nearby and help out.  Still drives and can perform simmple meal prep.  Independent for ADLs.  No ETOH.      Past Medical History:  Diagnosis Date   Arthritis    Atrial fibrillation ([place])    CKD (chronic kidney disease)    History of hiatal hernia    Hx SBO    Last 2013 all treated conservatively   Hypertension    Presence of permanent cardiac pacemaker    Prostate cancer [facility])    Stroke ([place]) 2017   left facial drooping noted 08/14/2016   Tachycardia-bradycardia syndrome ([place])  Type II diabetes mellitus ([name])    Ventral hernia    x3    Past Surgical History:  Procedure Laterality Date   ABDOMINAL AORTOGRAM W/LOWER EXTREMITY N/A 08/07/2016   Procedure: Abdominal Aortogram w/Lower Extremity;  Surgeon: Wellington Hampshire, MD;  Location: [facility];  Service: Cardiovascular;  Laterality: N/A;   CHOLECYSTECTOMY OPEN  03/31/2001   Dr Lindon Romp   COLON SURGERY     EXPLORATORY LAPAROTOMY  08/2004   Archie Endo 10/01/2010   HEMORRHOID SURGERY     HERNIA REPAIR     HIATAL HERNIA REPAIR  2002   INCISIONAL HERNIA REPAIR  08/2004   Archie Endo 10/01/2010   INSERT / REPLACE / REMOVE PACEMAKER     PERIPHERAL VASCULAR BALLOON ANGIOPLASTY  08/07/2016   Procedure: Peripheral Vascular Balloon Angioplasty;  Surgeon: Wellington Hampshire, MD;  Location: [facility];  Service: Cardiovascular;;  left popliteal artery   PERIPHERAL VASCULAR INTERVENTION   08/14/2016   popliteal artery/notes 08/14/2016   PERIPHERAL VASCULAR INTERVENTION Left 08/14/2016   Procedure: Peripheral Vascular Intervention;  Surgeon: Wellington Hampshire, MD;  Location: [facility];  Service: Cardiovascular;  Laterality: Left;  popliteal artery   PERMANENT PACEMAKER GENERATOR CHANGE N/A 04/06/2012   Procedure: PERMANENT PACEMAKER GENERATOR CHANGE;  Surgeon: Evans Lance, MD;  Location: [facility];  Service: Cardiovascular;  Laterality: N/A;   SMALL INTESTINE SURGERY  09/16/2004   SBO resection, VH repair    Prior to Admission medications   Medication Sig Start Date End Date Taking? Authorizing Provider  atorvastatin (LIPITOR) 10 MG tablet Take 1 tablet by mouth at bedtime. 03/28/21   [provider]  docusate sodium (COLACE) 100 MG capsule Take 1 capsule (100 mg total) by mouth 2 (two) times daily. 08/03/21   McElwee, Lauren A, NP  ergocalciferol (VITAMIN D2) 1.25 MG (50000 UT) capsule Take 50,000 Units by mouth once a week.    [provider]  ferrous sulfate (FEROSUL) 325 (65 FE) MG tablet Take 1 tablet (325 mg total) by mouth every other day. 11/30/21   [name], MD  folic acid (FOLVITE) 1 MG tablet Take 1 tablet (1 mg total) by mouth daily. 11/30/21 11/30/22  [name], MD  furosemide (LASIX) 20 MG tablet Take 1 tablet (20 mg total) by mouth daily. 12/10/21   Baldwin Jamaica, PA-C  haloperidol (HALDOL) 2 MG tablet Take 1 tablet (2 mg total) by mouth 2 (two) times daily. 12/25/21   [name], MD  JARDIANCE 25 MG TABS tablet Take by mouth. 12/06/21   [provider]  ketoconazole (NIZORAL) 2 % cream Apply topically. 12/03/21   [provider]  ketoconazole 2%-triamcinolone 0.1% 1:2 cream mixture Apply topically daily. Apply to foreskin daily for 10 days. 12/03/21   [name], MD  latanoprost (XALATAN) 0.005 % ophthalmic solution Place 1 drop into both eyes nightly. 05/03/20   [provider]  losartan (COZAAR) 100 MG tablet Take 1 tablet (100 mg total) by mouth daily. 08/03/21   McElwee, Lauren A, NP  memantine (NAMENDA) 10 MG tablet Take 1 tablet (10 mg total) by mouth 2 (two) times daily. 11/26/21   Garvin Fila, MD  metoprolol succinate (TOPROL-XL) 50 MG 24 hr tablet Take 50 mg by mouth daily.    [provider]  Nutritional Supplements (GLUCERNA ADVANCE SHAKE PO) Take by mouth.    [provider]  pantoprazole (PROTONIX) 40 MG tablet TAKE 1 TABLET(40 MG)  BY MOUTH DAILY 06/26/20   Cirigliano, Stanton Kidney K, DO  polyethylene glycol powder (GLYCOLAX/MIRALAX) 17 GM/SCOOP powder Take 17 g by mouth 2 (two) times daily as needed. 08/03/21   McElwee, Lauren A, NP  tamsulosin (FLOMAX) 0.4 MG CAPS capsule Take 1 capsule (0.4 mg total) by mouth daily. 12/04/21   [name], MD  vitamin B-12 (CYANOCOBALAMIN) 500 MCG tablet Take 500 mcg by mouth daily.    [provider]  XARELTO 15 MG TABS tablet TAKE 1 TABLET(15 MG) BY MOUTH DAILY WITH SUPPER 04/03/21   [name], MD    Scheduled Meds:  divalproex  250 mg Oral Q12H   insulin aspart  0-5 Units Subcutaneous QHS   insulin aspart  0-9 Units Subcutaneous TID WC   Infusions:  pantoprazole     pantoprazole     PRN Meds: acetaminophen **OR** acetaminophen, ondansetron **OR** ondansetron (ZOFRAN) IV   Allergies as of 12/31/2021   (No Known Allergies)    Family History  Problem Relation Age of Onset   Pneumonia Father    Stroke Brother    Stroke Sister    Cancer Brother        type unknown    Social History   Socioeconomic History   Marital status: Widowed    Spouse name: Not on file   Number of children: Not on file   Years of education: Not on file   Highest education level: Not on file  Occupational History   Not on file  Tobacco Use   Smoking status: Former    Packs/day: 1.50    Years: 15.00    Total pack years: 22.50    Types: Cigarettes    Quit date:  04/16/1955    Years since quitting: 66.7    Passive exposure: Past   Smokeless tobacco: Former    Types: Chew    Quit date: 08/17/1952  Vaping Use   Vaping Use: Never used  Substance and Sexual Activity   Alcohol use: No    Comment: 08/14/2016 "drank beer when I was a teenager"   Drug use: No   Sexual activity: Yes  Other Topics Concern   Not on file  Social History Narrative   Lives alone   Social Determinants of Health   Financial Resource Strain: Not on file  Food Insecurity: Not on file  Transportation Needs: Not on file  Physical Activity: Not on file  Stress: Not on file  Social Connections: Not on file  Intimate Partner Violence: Not on file    REVIEW OF SYSTEMS: Constitutional:  no fatigue or weakness ENT:  No nose bleeds Pulm:  denies SOB and cough CV:  No palpitations, no LE edema.  GU:  No hematuria, no frequency GI:  per HPI Heme: Denies excessive or unusual bleeding or bruising. Transfusions: No recall of prior transfusions and no records of such. Neuro:  No headaches, no peripheral tingling or numbness no dizziness.  No headaches. Derm:  No itching, no rash or sores.  Endocrine:  No sweats or chills.  No polyuria or dysuria Immunization: Reviewed. Travel: Not queried   PHYSICAL EXAM: Vital signs in last 24 hours: Vitals:   12/31/21 1515 12/31/21 1535  BP: 118/69   Pulse: 86   Resp: 18   Temp:  98 F (36.7 C)  SpO2: 100%    Wt Readings from Last 3 Encounters:  12/11/21 69.9 kg  12/10/21 70.5 kg  12/05/21 73 kg    General: Pleasant, frail, aged but  alert, comfortable and not acutely ill or in any distress. Head: No facial swelling.  Left facial droop.  There is a slightly firm circular raised subcutaneous lesion above the left eyebrow.  Patient says its been there for many years. Eyes: No scleral icterus.  No conjunctival pallor. Ears: Hard of hearing. Nose: No congestion or discharge Mouth: Moist, pink, clear mucosa. Neck: No JVD, no  masses Lungs: Clear bilaterally.  No labored breathing or cough. Heart: RRR.  No MRG.  S1, S2 present.  Pacemaker visible beneath the skin in left upper sternum Abdomen: Ventral hernias present.  Well healed upper midline surgery scar.  No tenderness.  No masses.  No bruits.  Active bowel sounds.  Soft.   Rectal: Deferred Musc/Skeltl: No joint redness, swelling or gross deformity. Extremities: No CCE.  Dusky skin coloring of the lower legs and feet and the feet are slightly cool but not cold.  Prominent vein in the right medial upper arm. Neurologic: Oriented x3.  Good historian.  Moves all 4 limbs without gross deficit but strength not tested. Skin: Dusky coloration to the lower legs and feet.  Healing linear abrasions on the left shin/anterior lower leg Nodes: No cervical adenopathy Psych: Pleasant, calm, cooperative.  Intake/Output from previous day: No intake/output data recorded. Intake/Output this shift: No intake/output data recorded.  LAB RESULTS: Recent Labs    12/31/21 1301  WBC 6.3  HGB 7.2*  HCT 22.7*  PLT 148*   BMET Lab Results  Component Value Date   NA 143 12/31/2021   NA 142 12/18/2021   NA 141 12/10/2021   K 4.3 12/31/2021   K 4.3 12/18/2021   K 4.6 12/10/2021   CL 115 (H) 12/31/2021   CL 105 12/18/2021   CL 108 (H) 12/10/2021   CO2 24 12/31/2021   CO2 26 12/18/2021   CO2 21 12/10/2021   GLUCOSE 174 (H) 12/31/2021   GLUCOSE 172 (H) 12/18/2021   GLUCOSE 153 (H) 12/10/2021   BUN 32 (H) 12/31/2021   BUN 24 12/18/2021   BUN 21 12/10/2021   CREATININE 2.36 (H) 12/31/2021   CREATININE 1.95 (H) 12/18/2021   CREATININE 1.86 (H) 12/10/2021   CALCIUM 7.6 (L) 12/31/2021   CALCIUM 7.7 (L) 12/18/2021   CALCIUM 8.0 (L) 12/10/2021   LFT Recent Labs    12/31/21 1301  PROT 5.1*  ALBUMIN 2.9*  AST 19  ALT 15  ALKPHOS 55  BILITOT 0.3   PT/INR Lab Results  Component Value Date   INR 1.6 (H) 12/31/2021   INR 1.1 07/29/2021   INR 1.3 (H) 07/30/2016    Hepatitis Panel No results for input(s): "HEPBSAG", "HCVAB", "HEPAIGM", "HEPBIGM" in the last 72 hours. C-Diff No components found for: "CDIFF" Lipase     Component Value Date/Time   LIPASE 33 07/29/2021 1141    Drugs of Abuse  No results found for: "LABOPIA", "COCAINSCRNUR", "LABBENZ", "AMPHETMU", "THCU", "LABBARB"   RADIOLOGY STUDIES: No results found.    IMPRESSION:   FOBT positive without gross bleeding.  Remote history small colon polyps obliterated during colonoscopy in 1989, age 10.  No subsequent colonoscopy.   No prior EGD.  Changes of chronic pancreatitis per CT.  No symptomatology to suggest this is a clinical issue.  LFTs, lipase are normal though albumin is low at 2.9 and had low prealbumin a month ago.  No significant history of alcohol use/abuse.    [place] anemia.      CKD w progression vs superimposed AKI.  Certainly contributing  to anemia  Brief tachycardia, resolved.  Started on Xarelto  Chronic Xarelto.  Unclear when he last took this so assume this was taken in the evening of 8/13.    Ventral hernias.  Manually reduced hernias a/w bowel obstruction in ED 07/2021.  No current or recent obstructive sxs w stable stool pattern.      PLAN:     Not clear we need to be aggressive in pursuing this, i.e. a colonoscopy or EGD.  Need to have a discussion with the patient and his family.  They were opposed to repair of hernias within the past few months so I suspect they will want to take a hands-off approach in terms of endoscopy.  However need to define this.  AM even reluctant to initiate empiric PPI given lack of any active GI sxs.      Allo regular diet.      Azucena Freed  12/31/2021, 4:40 PM Phone [phone]

## 2021-12-31 NOTE — ED Provider Notes (Cosign Needed Addendum)
[facility] EMERGENCY DEPARTMENT Provider Note   CSN: [id number] Arrival date & time: 12/31/21  1244     History  No chief complaint on file.   [name] is a 88 y.o. male with past medical history of atrial fibrillation, diabetes, known ventral hernia, prior stroke who presents to the emergency department per EMS due to brief episode of tachycardia to the 130s.  The patient's new home health nurse had visited the patient for the first time today and noted that his heart rate was approximately 137.  Patient was asymptomatic at this time however she had insisted the patient be brought to the hospital for evaluation.  Upon EMSs arrival the patient was found to be hemodynamically stable with initial pulse rate in the 80s to 90s.  The patient's home health nurse had also reported to EMS that she was concerned about a left facial droop.  The patient's grandson who is presently at bedside states that his face appears to be at baseline and that he has a known left-sided droop.  Patient states that he feels at his normal baseline level of health and has no new symptoms.  He states that he would not be here today if it were not for his home health nurse limiting EMS on his behalf.  Patient's grandson also notes that he is felt well recently with no new complaints.   HPI    Past Medical History:  Diagnosis Date   Arthritis    Atrial fibrillation ([place])    CKD (chronic kidney disease)    History of hiatal hernia    Hx SBO    Last 2013 all treated conservatively   Hypertension    Presence of permanent cardiac pacemaker    Prostate cancer [facility])    Stroke ([place]) 2017   left facial drooping noted 08/14/2016   Tachycardia-bradycardia syndrome ([place])    Type II diabetes mellitus ([place])    Ventral hernia    x3     Home Medications Prior to Admission medications   Medication Sig Start Date End Date Taking? Authorizing Provider  atorvastatin (LIPITOR) 10 MG tablet Take 1  tablet by mouth at bedtime. 03/28/21   [provider]  docusate sodium (COLACE) 100 MG capsule Take 1 capsule (100 mg total) by mouth 2 (two) times daily. 08/03/21   McElwee, Lauren A, NP  ergocalciferol (VITAMIN D2) 1.25 MG (50000 UT) capsule Take 50,000 Units by mouth once a week.    [provider]  ferrous sulfate (FEROSUL) 325 (65 FE) MG tablet Take 1 tablet (325 mg total) by mouth every other day. 11/30/21   [name], MD  folic acid (FOLVITE) 1 MG tablet Take 1 tablet (1 mg total) by mouth daily. 11/30/21 11/30/22  [name], MD  furosemide (LASIX) 20 MG tablet Take 1 tablet (20 mg total) by mouth daily. 12/10/21   Baldwin Jamaica, PA-C  haloperidol (HALDOL) 2 MG tablet Take 1 tablet (2 mg total) by mouth 2 (two) times daily. 12/25/21   [name], MD  JARDIANCE 25 MG TABS tablet Take by mouth. 12/06/21   [provider]  ketoconazole (NIZORAL) 2 % cream Apply topically. 12/03/21   [provider]  ketoconazole 2%-triamcinolone 0.1% 1:2 cream mixture Apply topically daily. Apply to foreskin daily for 10 days. 12/03/21   [name], MD  latanoprost (XALATAN) 0.005 % ophthalmic solution Place 1 drop into both eyes nightly. 05/03/20   [provider]  losartan (COZAAR) 100 MG tablet Take 1 tablet (100 mg total) by mouth daily. 08/03/21   McElwee, Lauren A, NP  memantine (NAMENDA) 10 MG tablet Take 1 tablet (10 mg total) by mouth 2 (two) times daily. 11/26/21   Garvin Fila, MD  metoprolol succinate (TOPROL-XL) 50 MG 24 hr tablet Take 50 mg by mouth daily.    [provider]  Nutritional Supplements (GLUCERNA ADVANCE SHAKE PO) Take by mouth.    [provider]  pantoprazole (PROTONIX) 40 MG tablet TAKE 1 TABLET(40 MG) BY MOUTH DAILY 06/26/20   Cirigliano, Mary K, DO  polyethylene glycol powder (GLYCOLAX/MIRALAX) 17 GM/SCOOP powder Take 17 g by mouth 2 (two) times daily as needed. 08/03/21    McElwee, Lauren A, NP  tamsulosin (FLOMAX) 0.4 MG CAPS capsule Take 1 capsule (0.4 mg total) by mouth daily. 12/04/21   [name], MD  vitamin B-12 (CYANOCOBALAMIN) 500 MCG tablet Take 500 mcg by mouth daily.    [provider]  XARELTO 15 MG TABS tablet TAKE 1 TABLET(15 MG) BY MOUTH DAILY WITH SUPPER 04/03/21   [name], MD      Allergies    Patient has no known allergies.    Review of Systems   Review of Systems  Constitutional:  Negative for chills and fever.  HENT:  Negative for ear pain and sore throat.   Eyes:  Negative for pain and visual disturbance.  Respiratory:  Negative for cough and shortness of breath.   Cardiovascular:  Negative for chest pain and palpitations.  Gastrointestinal:  Negative for abdominal pain and vomiting.  Genitourinary:  Negative for dysuria and hematuria.  Musculoskeletal:  Negative for arthralgias and back pain.  Skin:  Negative for color change and rash.  Neurological:  Negative for seizures and syncope.  All other systems reviewed and are negative.   Physical Exam Updated Vital Signs There were no vitals taken for this visit. Physical Exam Vitals and nursing note reviewed.  Constitutional:      General: He is not in acute distress.    Appearance: He is well-developed.     Comments: Upon entering the exam room, the patient is lying in bed awake and alert.  He appears elderly but is well-appearing.  He is in no acute distress.  He is smiling and communicative  HENT:     Head: Normocephalic and atraumatic.     Mouth/Throat:     Mouth: Mucous membranes are moist.  Eyes:     Conjunctiva/sclera: Conjunctivae normal.  Cardiovascular:     Rate and Rhythm: Normal rate and regular rhythm.     Heart sounds: No murmur heard.    Comments: Atrial fibrillation appreciated on the monitor with rates between 60 and 80.  Intermittent PVCs.  Irregularly irregular rhythm on exam.  No murmurs. Pulmonary:     Effort:  Pulmonary effort is normal. No respiratory distress.     Breath sounds: Normal breath sounds.     Comments: Lungs are clear to auscultation bilaterally.  Saturating well room air. Abdominal:     Palpations: Abdomen is soft.     Tenderness: There is no abdominal tenderness.     Comments: Reducible known ventral hernia without any abdominal tenderness.  Musculoskeletal:        General: No swelling.     Cervical back: Neck supple.  Skin:    General: Skin is warm and dry.     Capillary Refill: Capillary refill takes less than 2 seconds.  Neurological:     Mental Status: He is alert.     Comments: Patient is fully alert and oriented and is moving all extremity spontaneously.  He is grossly neurologically intact.  Psychiatric:        Mood and Affect: Mood normal.     ED Results / Procedures / Treatments   Labs (all labs ordered are listed, but only abnormal results are displayed) Labs Reviewed - No data to display  EKG None  Radiology No results found.  Procedures Procedures    Medications Ordered in ED Medications - No data to display  ED Course/ Medical Decision Making/ A&P Clinical Course as of 12/31/21 1541  Mon Dec 31, 2021  1531 S. 89yoM sent by home health nurse for tachycardia. Found to have incr BUN:Cr w Hgb drop from 9.5 to 7.2 over 3 weeks. Hemoccult positive, on xarelto. S/p protonix -admit to hospitalist -GI aware [BR]    Clinical Course User Index [BR] Faylene Million, MD                           Medical Decision Making Amount and/or Complexity of Data Reviewed Labs: ordered. Radiology: ordered.  Risk Decision regarding hospitalization.   The patient presents to the emergency department hemodynamically stable, afebrile and without any symptoms at this time in the setting of new home health nurse noting transient tachycardia to 130 as well as baseline left facial droop.  As above, grandson at bedside reveals that left facial droop is baseline.   Patient is currently completely asymptomatic.  Patient's brief episode of tachycardia may have been related to orthostasis versus atrial fibrillation with RVR versus SVT.  Will observe on cardiac monitor, obtain basic labs including TSH, magnesium and electrolytes and reassess.   I have personally reviewed and interpreted the patient's EKG which reveals T wave inversions in the lateral and high lateral leads similar to prior.  No ischemic changes.  Patient is in atrial fibrillation without RVR.  Intermittent PVC appreciated.  Overall no significant change from prior  I have personally reviewed and interpreted the patient's laboratory work-up which was markable for significant hemoglobin drop from approximate 9.5-7.2 today over the past 3 weeks.  Reassessment reveals the patient is hemodynamically stable.  Chaperoned rectal exam at this time reveals what appears to be melanotic stool.  Fecal occult positive.  Patient given Protonix, type and screen ordered and gastroenterology consulted.  We will admit the patient to hospitalist.  I have consulted GI for plan to see the patient tomorrow.  I have admitted the patient to hospitalist medicine for further work-up of the patient's GI bleed.         Final Clinical Impression(s) / ED Diagnoses Final diagnoses:  Gastrointestinal hemorrhage, unspecified gastrointestinal hemorrhage type    Rx / DC Orders ED Discharge Orders     None         Levie Heritage, MD 12/31/21 1542    Levie Heritage, MD 12/31/21 1547    Lacretia Leigh, MD 01/01/22 [phone]

## 2021-12-31 NOTE — ED Provider Notes (Signed)
I saw and evaluated the patient, reviewed the resident's note and I agree with the findings and plan.  EKG Interpretation  Date/Time:  Monday December 31 2021 12:52:05 EDT Ventricular Rate:  88 PR Interval:    QRS Duration: 92 QT Interval:  391 QTC Calculation: 474 R Axis:   44 Text Interpretation: Atrial fibrillation Low voltage, extremity and precordial leads Anteroseptal infarct, old Repol abnrm suggests ischemia, anterolateral Confirmed by Lacretia Leigh ([id number]) on 12/31/2021 1:48:22 PM   Patient is EKG per my interpretation shows A-fib which is chronic. 88 year old male presents due to altered mental status.  According to relative at bedside, patient has a new home health nurse.  Family feels that patient is at his baseline at this time.  Will perform screening labs and likely discharge      Lacretia Leigh, MD 12/31/21 1349

## 2021-12-31 NOTE — Telephone Encounter (Signed)
[name]  (pt's granddaighter) wants Dr. Ethelene Craig to know, [name] is in the ED at [facility] for elevated heart rate.  His sugar yesterday was 277 and she is wanting [facility] to help read this.   She is very happy with the behavioral pill he has been taking, it works great.   Refill  pantoprazole (PROTONIX) 40 MG tablet [[id number]] No pills left, [facility]. [facility] [phone] - [place], [place] - [place] AT [address]  [address] [place] [address]  Phone:  [phone]  Fax:  [phone]  DEA #:  [id number]

## 2021-12-31 NOTE — Telephone Encounter (Signed)
Caller Name: [facility] Call back phone #: [phone]  Reason for Call: Called on behalf of pt, heartrate currently 136 and has been between 120-136 all morning. Bp was on the low end of 90/60 requested to be triaged.

## 2021-12-31 NOTE — ED Notes (Signed)
Dinner tray ordered.

## 2021-12-31 NOTE — ED Triage Notes (Signed)
Brought in for facial droop but is not new per pt grandson. Pt found out to be hypotensive in the 90s. Alert and oriented x 3. Pacemaker in place. Denies pain.

## 2021-12-31 NOTE — H&P (Signed)
History and Physical    [name] PXT:[id number] DOB: 11-17-1923 DOA: 12/31/2021  I have briefly reviewed the patient's prior medical records in [place]  PCP: [name], MD  Patient coming from: home with granddaughter  Chief Complaint: fatigue/weakness  HPI: [name] is a 88 y.o. male with medical history significant of chronic a fib, DM, CVA and dementia . Patient presents to the emergency department via EMS due to brief episode of tachycardia to the 130s.  The patient's new home health nurse had visited the patient for the first time today and noted that his heart rate was approximately 137.  Patient was asymptomatic at this time however she had insisted the patient be brought to the hospital for evaluation.  Upon EMSs arrival the patient was found to be hemodynamically stable with initial pulse rate in the 80s to 90s.  The patient's home health nurse had also reported to EMS that she was concerned about a left facial droop.  The patient's grandson who is presently at bedside states that his face appears to be at baseline and that he has a known left-sided droop.  Patient states that he feels at his normal baseline level of health and has no new symptoms.   In the ER, Hgb showed a drop from 8/5- 7.2 from 3 weeks ago.  Rectal exam by ER doc showed melanotic stools that were occult positive.  Hospitalist and GI were consulted.   Review of Systems: As per HPI otherwise 10 point review of systems negative.   Past Medical History:  Diagnosis Date   Arthritis    Atrial fibrillation ([place])    CKD (chronic kidney disease)    History of hiatal hernia    Hx SBO    Last 2013 all treated conservatively   Hypertension    Presence of permanent cardiac pacemaker    Prostate cancer [facility])    Stroke ([place]) 2017   left facial drooping noted 08/14/2016   Tachycardia-bradycardia syndrome ([place])    Type II diabetes mellitus ([place])    Ventral hernia    x3    Past  Surgical History:  Procedure Laterality Date   ABDOMINAL AORTOGRAM W/LOWER EXTREMITY N/A 08/07/2016   Procedure: Abdominal Aortogram w/Lower Extremity;  Surgeon: Wellington Hampshire, MD;  Location: [place] [facility];  Service: Cardiovascular;  Laterality: N/A;   CHOLECYSTECTOMY OPEN  03/31/2001   Dr Lindon Romp   COLON SURGERY     EXPLORATORY LAPAROTOMY  08/2004   Archie Endo 10/01/2010   HEMORRHOID SURGERY     HERNIA REPAIR     HIATAL HERNIA REPAIR  2002   INCISIONAL HERNIA REPAIR  08/2004   Archie Endo 10/01/2010   INSERT / REPLACE / REMOVE PACEMAKER     PERIPHERAL VASCULAR BALLOON ANGIOPLASTY  08/07/2016   Procedure: Peripheral Vascular Balloon Angioplasty;  Surgeon: Wellington Hampshire, MD;  Location: [place] [facility];  Service: Cardiovascular;;  left popliteal artery   PERIPHERAL VASCULAR INTERVENTION  08/14/2016   popliteal artery/notes 08/14/2016   PERIPHERAL VASCULAR INTERVENTION Left 08/14/2016   Procedure: Peripheral Vascular Intervention;  Surgeon: Wellington Hampshire, MD;  Location: [place] [facility];  Service: Cardiovascular;  Laterality: Left;  popliteal artery   PERMANENT PACEMAKER GENERATOR CHANGE N/A 04/06/2012   Procedure: PERMANENT PACEMAKER GENERATOR CHANGE;  Surgeon: Evans Lance, MD;  Location[facility];  Service: Cardiovascular;  Laterality: N/A;   SMALL INTESTINE SURGERY  09/16/2004   SBO resection, VH repair  reports that he quit smoking about 66 years ago. His smoking use included cigarettes. He has a 22.50 pack-year smoking history. He has been exposed to tobacco smoke. He quit smokeless tobacco use about 69 years ago.  His smokeless tobacco use included chew. He reports that he does not drink alcohol and does not use drugs.  No Known Allergies  Family History  Problem Relation Age of Onset   Pneumonia Father    Stroke Brother    Stroke Sister    Cancer Brother        type unknown    Prior to Admission medications   Medication Sig Start Date End Date Taking?  Authorizing Provider  atorvastatin (LIPITOR) 10 MG tablet Take 1 tablet by mouth at bedtime. 03/28/21   [provider]  docusate sodium (COLACE) 100 MG capsule Take 1 capsule (100 mg total) by mouth 2 (two) times daily. 08/03/21   McElwee, Lauren A, NP  ergocalciferol (VITAMIN D2) 1.25 MG (50000 UT) capsule Take 50,000 Units by mouth once a week.    [provider]  ferrous sulfate (FEROSUL) 325 (65 FE) MG tablet Take 1 tablet (325 mg total) by mouth every other day. 11/30/21   [name], MD  folic acid (FOLVITE) 1 MG tablet Take 1 tablet (1 mg total) by mouth daily. 11/30/21 11/30/22  [name], MD  furosemide (LASIX) 20 MG tablet Take 1 tablet (20 mg total) by mouth daily. 12/10/21   Baldwin Jamaica, PA-C  haloperidol (HALDOL) 2 MG tablet Take 1 tablet (2 mg total) by mouth 2 (two) times daily. 12/25/21   [name], MD  JARDIANCE 25 MG TABS tablet Take by mouth. 12/06/21   [provider]  ketoconazole (NIZORAL) 2 % cream Apply topically. 12/03/21   [provider]  ketoconazole 2%-triamcinolone 0.1% 1:2 cream mixture Apply topically daily. Apply to foreskin daily for 10 days. 12/03/21   [name], MD  latanoprost (XALATAN) 0.005 % ophthalmic solution Place 1 drop into both eyes nightly. 05/03/20   [provider]  losartan (COZAAR) 100 MG tablet Take 1 tablet (100 mg total) by mouth daily. 08/03/21   McElwee, Lauren A, NP  memantine (NAMENDA) 10 MG tablet Take 1 tablet (10 mg total) by mouth 2 (two) times daily. 11/26/21   Garvin Fila, MD  metoprolol succinate (TOPROL-XL) 50 MG 24 hr tablet Take 50 mg by mouth daily.    [provider]  Nutritional Supplements (GLUCERNA ADVANCE SHAKE PO) Take by mouth.    [provider]  pantoprazole (PROTONIX) 40 MG tablet TAKE 1 TABLET(40 MG) BY MOUTH DAILY 06/26/20   Cirigliano, Mary K, DO  polyethylene glycol powder (GLYCOLAX/MIRALAX) 17 GM/SCOOP powder  Take 17 g by mouth 2 (two) times daily as needed. 08/03/21   McElwee, Lauren A, NP  tamsulosin (FLOMAX) 0.4 MG CAPS capsule Take 1 capsule (0.4 mg total) by mouth daily. 12/04/21   [name], MD  vitamin B-12 (CYANOCOBALAMIN) 500 MCG tablet Take 500 mcg by mouth daily.    [provider]  XARELTO 15 MG TABS tablet TAKE 1 TABLET(15 MG) BY MOUTH DAILY WITH SUPPER 04/03/21   [name], MD    Physical Exam: Vitals:   12/31/21 1345 12/31/21 1430 12/31/21 1515 12/31/21 1535  BP: 124/85 119/72 118/69   Pulse: 78 (!) 109 86   Resp: (!) '23 19 18   '$ Temp:    98 F (36.7 C)  TempSrc:  Oral  SpO2: 100% 100% 100%       Constitutional: NAD, calm, comfortable Eyes: senile changes ENMT: Mucous membranes are moist. Posterior pharynx clear of any exudate or lesions Neck: normal, supple, no masses, no thyromegaly Respiratory: clear to auscultation bilaterally, no wheezing, no crackles. Normal respiratory effort. No accessory muscle use.  Cardiovascular: irregular Abdomen: no tenderness, no masses palpated. Bowel sounds positive.  Musculoskeletal: no clubbing / cyanosis. Normal muscle tone.  Skin: few bleeding scabs Neurologic: no focal deficits, some chronic facial droop, hard of hearing Psychiatric: Normal judgment and insight. Alert and awake  Labs on Admission: I have personally reviewed following labs and imaging studies  CBC: Recent Labs  Lab 12/31/21 1301  WBC 6.3  NEUTROABS 4.4  HGB 7.2*  HCT 22.7*  MCV 81.9  PLT 240*   Basic Metabolic Panel: Recent Labs  Lab 12/31/21 1301  NA 143  K 4.3  CL 115*  CO2 24  GLUCOSE 174*  BUN 32*  CREATININE 2.36*  CALCIUM 7.6*  MG 2.0   GFR: CrCl cannot be calculated (Unknown ideal weight.). Liver Function Tests: Recent Labs  Lab 12/31/21 1301  AST 19  ALT 15  ALKPHOS 55  BILITOT 0.3  PROT 5.1*  ALBUMIN 2.9*   No results for input(s): "LIPASE", "AMYLASE" in the last 168 hours. No results for  input(s): "AMMONIA" in the last 168 hours. Coagulation Profile: Recent Labs  Lab 12/31/21 1531  INR 1.6*   Cardiac Enzymes: No results for input(s): "CKTOTAL", "CKMB", "CKMBINDEX", "TROPONINI" in the last 168 hours. BNP (last 3 results) No results for input(s): "PROBNP" in the last 8760 hours. HbA1C: No results for input(s): "HGBA1C" in the last 72 hours. CBG: No results for input(s): "GLUCAP" in the last 168 hours. Lipid Profile: No results for input(s): "CHOL", "HDL", "LDLCALC", "TRIG", "CHOLHDL", "LDLDIRECT" in the last 72 hours. Thyroid Function Tests: Recent Labs    12/31/21 1307  TSH 2.092   Anemia Panel: No results for input(s): "VITAMINB12", "FOLATE", "FERRITIN", "TIBC", "IRON", "RETICCTPCT" in the last 72 hours. Urine analysis:    Component Value Date/Time   COLORURINE YELLOW 07/29/2021 [place] 07/29/2021 0943   LABSPEC 1.012 07/29/2021 0943   PHURINE 8.0 07/29/2021 0943   GLUCOSEU NEGATIVE 07/29/2021 0943   GLUCOSEU NEGATIVE 10/30/2020 1051   HGBUR NEGATIVE 07/29/2021 0943   BILIRUBINUR neg 08/03/2021 1702   KETONESUR 5 (A) 07/29/2021 0943   PROTEINUR Positive (A) 08/03/2021 1702   PROTEINUR NEGATIVE 07/29/2021 0943   UROBILINOGEN 1.0 08/03/2021 1702   UROBILINOGEN 0.2 10/30/2020 1051   NITRITE neg 08/03/2021 1702   NITRITE NEGATIVE 07/29/2021 0943   LEUKOCYTESUR Small (1+) (A) 08/03/2021 1702   LEUKOCYTESUR NEGATIVE 07/29/2021 0943     Radiological Exams on Admission: No results found.  EKG: Independently reviewed. Chronic a fib  Assessment/Plan Principal Problem:   GI bleed Active Problems:   Type 2 diabetes mellitus without complication, without long-term current use of insulin ([name])   Atrial fibrillation ([name])   PPM-Medtronic   Chronic diastolic heart failure ([name])   Stage 3b chronic kidney disease ([name])   Anemia due to chronic kidney disease    Suspected GI bleed -PPI gtt -GI consult -h/h q 12 hours Monitor for  bleeding -last xarelto dose 8/13 around 7PM -hold PO iron  DM type 2 -SSI -hold home meds for now  Anemia of CKD and ABLA -type and screen -transfuse for symptoms or <7  Chronic a fib- has PPM- medtronic -hold xarelto -resume  BB as able -recent dose increase to 50 per granddaughter?  AKI on CKD state 3b -baseline is Cr of 1.9-2 -daily labs -avoid hypotension/nephrotoxic agents -follows with [facility](last seen 10/22/21-- d/c'd lisinopril)  Dementia -recent change from seroquel to haldol -resume   DVT prophylaxis: scd  Code Status: full-- continue to address this during hospitalization  Family Communication: at bedside Disposition Plan: obs for possible endoscopy Consults called: GI- [place]    Admission status: obs   At the point of initial evaluation, it is my clinical opinion that admission for OBSERVATION is reasonable and necessary because the patient's presenting complaints in the context of their chronic conditions represent sufficient risk of deterioration or significant morbidity to constitute reasonable grounds for close observation in the hospital setting, but that the patient may be medically stable for discharge from the hospital within 24 to 48 hours.     Geradine Girt [facility]   How to contact the [facility] Attending or Consulting provider [place] or covering provider during after hours [place], for this patient?  Check the care team in [facility] and look for a) attending/consulting [facility] provider listed and b) the [facility] team listed Log into [email or web address] and use [place]'s universal password to access. If you do not have the password, please contact the hospital operator. Locate the [facility] provider you are looking for under [facility] and page to a number that you can be directly reached. If you still have difficulty reaching the provider, please page the [facility] (Director on Call) for the Hospitalists listed on amion for  assistance.  12/31/2021, 4:47 PM

## 2021-12-31 NOTE — Telephone Encounter (Signed)
[name] from [place] She is informing Dr Ethelene Hal that the pts sister took him off his metoprolol for 2 days his BP dropped so low he is now in the hospital.

## 2022-01-01 DIAGNOSIS — Z950 Presence of cardiac pacemaker: Secondary | ICD-10-CM | POA: Diagnosis not present

## 2022-01-01 DIAGNOSIS — K921 Melena: Secondary | ICD-10-CM | POA: Diagnosis not present

## 2022-01-01 DIAGNOSIS — N1832 Chronic kidney disease, stage 3b: Secondary | ICD-10-CM | POA: Diagnosis not present

## 2022-01-01 DIAGNOSIS — K922 Gastrointestinal hemorrhage, unspecified: Secondary | ICD-10-CM

## 2022-01-01 DIAGNOSIS — E119 Type 2 diabetes mellitus without complications: Secondary | ICD-10-CM | POA: Diagnosis not present

## 2022-01-01 DIAGNOSIS — D631 Anemia in chronic kidney disease: Secondary | ICD-10-CM | POA: Diagnosis not present

## 2022-01-01 DIAGNOSIS — I5032 Chronic diastolic (congestive) heart failure: Secondary | ICD-10-CM | POA: Diagnosis not present

## 2022-01-01 LAB — BASIC METABOLIC PANEL
Anion gap: 6 (ref 5–15)
BUN: 31 mg/dL — ABNORMAL HIGH (ref 8–23)
CO2: 24 mmol/L (ref 22–32)
Calcium: 8.1 mg/dL — ABNORMAL LOW (ref 8.9–10.3)
Chloride: 112 mmol/L — ABNORMAL HIGH (ref 98–111)
Creatinine, Ser: 2.23 mg/dL — ABNORMAL HIGH (ref 0.61–1.24)
GFR, Estimated: 26 mL/min — ABNORMAL LOW (ref >60)
Glucose, Bld: 119 mg/dL — ABNORMAL HIGH (ref 70–99)
Potassium: 4.3 mmol/L (ref 3.5–5.1)
Sodium: 142 mmol/L (ref 135–145)

## 2022-01-01 LAB — CBC
HCT: 24.8 % — ABNORMAL LOW (ref 39.0–52.0)
Hemoglobin: 8 g/dL — ABNORMAL LOW (ref 13.0–17.0)
MCH: 25.9 pg — ABNORMAL LOW (ref 26.0–34.0)
MCHC: 32.3 g/dL (ref 30.0–36.0)
MCV: 80.3 fL (ref 80.0–100.0)
Platelets: 171 10*3/uL (ref 150–400)
RBC: 3.09 MIL/uL — ABNORMAL LOW (ref 4.22–5.81)
RDW: 16.1 % — ABNORMAL HIGH (ref 11.5–15.5)
WBC: 8.9 10*3/uL (ref 4.0–10.5)
nRBC: 0 % (ref 0.0–0.2)

## 2022-01-01 LAB — CBG MONITORING, ED: Glucose-Capillary: 110 mg/dL — ABNORMAL HIGH (ref 70–99)

## 2022-01-01 LAB — TYPE AND SCREEN
ABO/RH(D): A POS
Antibody Screen: NEGATIVE

## 2022-01-01 LAB — IRON AND TIBC
Iron: 41 ug/dL — ABNORMAL LOW (ref 45–182)
Saturation Ratios: 18 % (ref 17.9–39.5)
TIBC: 230 ug/dL — ABNORMAL LOW (ref 250–450)
UIBC: 189 ug/dL

## 2022-01-01 LAB — HEMOGLOBIN AND HEMATOCRIT, BLOOD
HCT: 22.8 % — ABNORMAL LOW (ref 39.0–52.0)
Hemoglobin: 7.5 g/dL — ABNORMAL LOW (ref 13.0–17.0)

## 2022-01-01 LAB — FERRITIN: Ferritin: 56 ng/mL (ref 24–336)

## 2022-01-01 MED ORDER — METOPROLOL SUCCINATE ER 25 MG PO TB24: 25.0000 mg | ORAL_TABLET | Freq: Every day | ORAL | 0 refills | Status: DC

## 2022-01-01 MED ORDER — PANTOPRAZOLE SODIUM 40 MG PO TBEC
DELAYED_RELEASE_TABLET | ORAL | 1 refills | Status: DC
Start: 2022-01-01 — End: 2022-10-27

## 2022-01-01 MED ORDER — DOCUSATE SODIUM 100 MG PO CAPS: 100.0000 mg | ORAL_CAPSULE | ORAL | Status: DC

## 2022-01-01 NOTE — Discharge Planning (Signed)
Pt currently active with [place] for [place] services as confirmed by [facility] with Caryl Pina of [facility].  Pt will resume [place] services. No  DME needs identified at this time.

## 2022-01-01 NOTE — Care Management Obs Status (Signed)
[place] NOTIFICATION   Patient Details  Name: DONALD MEMOLI MRN: [id number] Date of Birth: 09/03/1923   Medicare Observation Status Notification Given:  Yes    Fuller Mandril, RN 01/01/2022, 12:28 PM

## 2022-01-01 NOTE — Discharge Summary (Signed)
Physician Discharge Summary  [name] IEP:[id number] DOB: [birth year] DOA: 12/31/2021  PCP: [name], MD  Admit date: 12/31/2021 Discharge date: 01/01/2022  Admitted From: home Discharge disposition: home   Recommendations for Outpatient Follow-Up:   Cbc, bmp 1 week Home health could consider dedicated pancreatic protocol CT for further evaluation of his pancreas to exclude malignancy  Discharge Diagnosis:   Principal Problem:   GI bleed Active Problems:   Type 2 diabetes mellitus without complication, without long-term current use of insulin ([name])   Atrial fibrillation ([name])   PPM-Medtronic   Chronic diastolic heart failure ([name])   Stage 3b chronic kidney disease ([place])   Anemia due to chronic kidney disease    Discharge Condition: Improved.  Diet recommendation: Low sodium, heart healthy.  Carbohydrate-modified  Wound care: None.  Code status: Full.   History of Present Illness:   [name] is a 88 y.o. male with medical history significant of chronic a fib, DM, CVA and dementia . Patient presents to the emergency department via EMS due to brief episode of tachycardia to the 130s.  The patient's new home health nurse had visited the patient for the first time today and noted that his heart rate was approximately 137.  Patient was asymptomatic at this time however she had insisted the patient be brought to the hospital for evaluation.  Upon EMSs arrival the patient was found to be hemodynamically stable with initial pulse rate in the 80s to 90s.  The patient's home health nurse had also reported to EMS that she was concerned about a left facial droop.  The patient's grandson who is presently at bedside states that his face appears to be at baseline and that he has a known left-sided droop.  Patient states that he feels at his normal baseline level of health and has no new symptoms.    In the ER, Hgb showed a drop from 8/5- 7.2 from 3  weeks ago.  Rectal exam by ER doc showed melanotic stools that were occult positive.  Hospitalist and GI were consulted.     Hospital Course by Problem:    Suspected GI bleed -resume PPI -GI consult- No plans for endoscopic evaluation at this time -hgb improved   DM type 2 -resume home meds   Anemia of CKD and ABLA -hgb stable   Chronic a fib- has PPM- medtronic -resume xarelto -resume BB but at a lower dose   AKI on CKD state 3b -baseline is Cr of 1.9-2 -avoid hypotension/nephrotoxic agents -follows with [facility](last seen 10/22/21-- d/c'd lisinopril)   Dementia -recent change from seroquel to haldol -resume home meds   Medical Consultants:   GI   Discharge Exam:   Vitals:   01/01/22 0932 01/01/22 1042  BP: 129/82 119/70  Pulse: 92 85  Resp:  18  Temp:    SpO2:  100%   Vitals:   01/01/22 0538 01/01/22 0800 01/01/22 0932 01/01/22 1042  BP:  133/70 129/82 119/70  Pulse:  (!) 110 92 85  Resp:    18  Temp: 98 F (36.7 C)     TempSrc: Oral     SpO2:  99%  100%    General exam: Appears calm and comfortable.    The results of significant diagnostics from this hospitalization (including imaging, microbiology, ancillary and laboratory) are listed below for reference.     Procedures and Diagnostic Studies:   No results found.   Labs:  Basic Metabolic Panel: Recent Labs  Lab 12/31/21 1301 01/01/22 0335  NA 143 142  K 4.3 4.3  CL 115* 112*  CO2 24 24  GLUCOSE 174* 119*  BUN 32* 31*  CREATININE 2.36* 2.23*  CALCIUM 7.6* 8.1*  MG 2.0  --    GFR CrCl cannot be calculated (Unknown ideal weight.). Liver Function Tests: Recent Labs  Lab 12/31/21 1301  AST 19  ALT 15  ALKPHOS 55  BILITOT 0.3  PROT 5.1*  ALBUMIN 2.9*   No results for input(s): "LIPASE", "AMYLASE" in the last 168 hours. No results for input(s): "AMMONIA" in the last 168 hours. Coagulation profile Recent Labs  Lab 12/31/21 1531  INR 1.6*     CBC: Recent Labs  Lab 12/31/21 1301 12/31/21 2355 01/01/22 0335  WBC 6.3  --  8.9  NEUTROABS 4.4  --   --   HGB 7.2* 7.5* 8.0*  HCT 22.7* 22.8* 24.8*  MCV 81.9  --  80.3  PLT 148*  --  171   Cardiac Enzymes: No results for input(s): "CKTOTAL", "CKMB", "CKMBINDEX", "TROPONINI" in the last 168 hours. BNP: Invalid input(s): "POCBNP" CBG: Recent Labs  Lab 12/31/21 1653 12/31/21 2131 01/01/22 0749  GLUCAP 86 155* 110*   D-Dimer No results for input(s): "DDIMER" in the last 72 hours. Hgb A1c No results for input(s): "HGBA1C" in the last 72 hours. Lipid Profile No results for input(s): "CHOL", "HDL", "LDLCALC", "TRIG", "CHOLHDL", "LDLDIRECT" in the last 72 hours. Thyroid function studies Recent Labs    12/31/21 1307  TSH 2.092   Anemia work up Recent Labs    01/01/22 0335  FERRITIN 56  TIBC 230*  IRON 41*   Microbiology No results found for this or any previous visit (from the past 240 hour(s)).   Discharge Instructions:   Discharge Instructions     Diet general   Complete by: As directed    Discharge instructions   Complete by: As directed    I have made adjustments to his BP medications today Resume jardiance if he was taking that at home Resume home health   Increase activity slowly   Complete by: As directed       Allergies as of 01/01/2022   No Known Allergies      Medication List     STOP taking these medications    losartan 100 MG tablet Commonly known as: COZAAR       TAKE these medications    atorvastatin 10 MG tablet Commonly known as: LIPITOR Take 1 tablet by mouth at bedtime.   docusate sodium 100 MG capsule Commonly known as: COLACE Take 1 capsule (100 mg total) by mouth every other day.   ergocalciferol 1.25 MG (50000 UT) capsule Commonly known as: VITAMIN D2 Take 50,000 Units by mouth once a week.   ferrous sulfate 325 (65 FE) MG tablet Commonly known as: FeroSul Take 1 tablet (325 mg total) by mouth every  other day. What changed: when to take this   folic acid 1 MG tablet Commonly known as: FOLVITE Take 1 tablet (1 mg total) by mouth daily.   furosemide 20 MG tablet Commonly known as: LASIX Take 1 tablet (20 mg total) by mouth daily.   GLUCERNA ADVANCE SHAKE PO Take by mouth.   haloperidol 2 MG tablet Commonly known as: HALDOL Take 1 tablet (2 mg total) by mouth 2 (two) times daily. What changed: when to take this   Jardiance 25 MG Tabs tablet Generic drug: empagliflozin Take  by mouth.   ketoconazole 2 % cream Commonly known as: NIZORAL Apply topically.   ketoconazole 2%-triamcinolone 0.1% 1:2 cream mixture Apply topically daily. Apply to foreskin daily for 10 days.   latanoprost 0.005 % ophthalmic solution Commonly known as: XALATAN Place 1 drop into both eyes nightly.   memantine 10 MG tablet Commonly known as: NAMENDA Take 1 tablet (10 mg total) by mouth 2 (two) times daily.   metoprolol succinate 25 MG 24 hr tablet Commonly known as: TOPROL-XL Take 1 tablet (25 mg total) by mouth daily. Start taking on: January 02, 2022 What changed:  medication strength how much to take   pantoprazole 40 MG tablet Commonly known as: PROTONIX TAKE 1 TABLET(40 MG) BY MOUTH DAILY   polyethylene glycol powder 17 GM/SCOOP powder Commonly known as: GLYCOLAX/MIRALAX Take 17 g by mouth 2 (two) times daily as needed.   tamsulosin 0.4 MG Caps capsule Commonly known as: FLOMAX Take 1 capsule (0.4 mg total) by mouth daily.   vitamin B-12 500 MCG tablet Commonly known as: CYANOCOBALAMIN Take 500 mcg by mouth daily.   Xarelto 15 MG Tabs tablet Generic drug: Rivaroxaban TAKE 1 TABLET(15 MG) BY MOUTH DAILY WITH SUPPER What changed: See the new instructions.          Time coordinating discharge: 45 min  Signed:  Geradine Girt DO  [facility] 01/01/2022, 11:08 AM

## 2022-01-01 NOTE — Evaluation (Signed)
Physical Therapy Evaluation Patient Details Name: [name] MRN: [id number] DOB: June 24, 1923 Today's Date: 01/01/2022  History of Present Illness  Pt is a 88 y/o male sent by [facility] secondary to tachycardia. Also found to have possible GI bleed. PMH includes dementia, a fib, DM, s/p pacemaker, dCHF, CKD, and CVA.  Clinical Impression  Pt admitted secondary to problem above with deficits below. Pt with unsteadiness when using cane, requiring up to min A. Much improved steadiness noted with use of RW and educated to use at home for increased safety. Pt's grandson present and reports pt has 24/7 caregivers at home and also reports pt has been working with [facility]. Recommend continuing HHPT at d/c. Will continue to follow acutely.        Recommendations for follow up therapy are one component of a multi-disciplinary discharge planning process, led by the attending physician.  Recommendations may be updated based on patient status, additional functional criteria and insurance authorization.  Follow Up Recommendations Home health PT      Assistance Recommended at Discharge Frequent or constant Supervision/Assistance  Patient can return home with the following  A little help with walking and/or transfers;A little help with bathing/dressing/bathroom;Assistance with cooking/housework;Direct supervision/assist for medications management;Direct supervision/assist for financial management;Help with stairs or ramp for entrance;Assist for transportation    Equipment Recommendations None recommended by PT  Recommendations for Other Services       Functional Status Assessment Patient has had a recent decline in their functional status and demonstrates the ability to make significant improvements in function in a reasonable and predictable amount of time.     Precautions / Restrictions Precautions Precautions: Fall Restrictions Weight Bearing Restrictions: No      Mobility  Bed Mobility                     Transfers                   General transfer comment: Min A for lift assist and steadying.    Ambulation/Gait Ambulation/Gait assistance: Min assist, Min guard Gait Distance (Feet): 50 Feet Assistive device: Straight cane, Rolling walker (2 wheels) Gait Pattern/deviations: Step-through pattern, Decreased stride length Gait velocity: Decreased     General Gait Details: Usteadiness noted with use of cane. Requiring HHA as well as use of cane to ambulate to bathroom. Steadiness much improved with use of RW on return to room from bathroom.  Stairs            Wheelchair Mobility    Modified Rankin (Stroke Patients Only)       Balance                                             Pertinent Vitals/Pain Pain Assessment Pain Assessment: No/denies pain    Home Living Family/patient expects to be discharged to:: Private residence Living Arrangements: Alone Available Help at Discharge: Personal care attendant;Available 24 hours/day Type of Home: House Home Access: Stairs to enter Entrance Stairs-Rails: Psychiatric nurse of Steps: 2   Home Layout: One level Home Equipment: Cane - single Barista (2 wheels)      Prior Function Prior Level of Function : Needs assist             Mobility Comments: Supervision for ambulation using cane ADLs Comments: Supervision for ADLs. Family takes pt to grocery  shop and aides assist with cleaning and cooking.     Hand Dominance        Extremity/Trunk Assessment   Upper Extremity Assessment Upper Extremity Assessment: Defer to OT evaluation    Lower Extremity Assessment Lower Extremity Assessment: Generalized weakness    Cervical / Trunk Assessment Cervical / Trunk Assessment: Kyphotic  Communication   Communication: HOH  Cognition Arousal/Alertness: Awake/alert Behavior During Therapy: WFL for tasks assessed/performed Overall Cognitive  Status: History of cognitive impairments - at baseline                                 General Comments: Dementia at baseline        General Comments General comments (skin integrity, edema, etc.): Pt's grandson present    Exercises     Assessment/Plan    PT Assessment Patient needs continued PT services  PT Problem List Decreased strength;Decreased balance;Decreased activity tolerance;Decreased mobility;Decreased cognition;Decreased safety awareness       PT Treatment Interventions DME instruction;Gait training;Stair training;Functional mobility training;Therapeutic exercise;Therapeutic activities;Balance training;Cognitive remediation;Patient/family education    PT Goals (Current goals can be found in the Care Plan section)  Acute Rehab PT Goals Patient Stated Goal: to go home PT Goal Formulation: With patient/family Time For Goal Achievement: 01/15/22 Potential to Achieve Goals: Good    Frequency Min 3X/week     Co-evaluation               AM-PAC PT "6 Clicks" Mobility  Outcome Measure Help needed turning from your back to your side while in a flat bed without using bedrails?: A Little Help needed moving from lying on your back to sitting on the side of a flat bed without using bedrails?: A Little Help needed moving to and from a bed to a chair (including a wheelchair)?: A Little Help needed standing up from a chair using your arms (e.g., wheelchair or bedside chair)?: A Little Help needed to walk in hospital room?: A Little Help needed climbing 3-5 steps with a railing? : A Lot 6 Click Score: 17    End of Session   Activity Tolerance: Patient tolerated treatment well Patient left: in bed;with call bell/phone within reach;with family/visitor present (on stretcher in ED) Nurse Communication: Mobility status PT Visit Diagnosis: Unsteadiness on feet (R26.81);Muscle weakness (generalized) (M62.81)    Time: 2633-3545 PT Time Calculation (min)  (ACUTE ONLY): 25 min   Charges:   PT Evaluation $PT Eval Moderate Complexity: 1 Mod          [name], PT, DPT  Acute Rehabilitation Services  Office: [phone]   [name] 01/01/2022, 11:32 AM

## 2022-01-01 NOTE — Telephone Encounter (Signed)
Spoke with patients grand daughter who states they where wanting to try Dexcom with patient instead of pricking his finger all the time due to not having no one at the home that can do this daily. Would also like a refill on pending Rx patient currently admitted to hospital they are wanting this for after patient is discharged. Please advise

## 2022-01-01 NOTE — Evaluation (Signed)
Occupational Therapy Evaluation Patient Details Name: [name] MRN: [id number] DOB: 23-Aug-1923 Today's Date: 01/01/2022   History of Present Illness 88 y/o male sent to ED by [facility] secondary to tachycardia. Also found to have possible GI bleed. PMH includes dementia, a fib, DM, s/p pacemaker, dCHF, CKD, and CVA.   Clinical Impression   PTA, pt was living with in his home and has 24/7 support from family and PCAs. Pt was performing ADLs independently and using cane for mobility; family and PCA assisted with IADLs. Pt currently requiring Min A for LB ADLs and functional mobility. Pt presenting with decreased strength and balance compared to baseline. Recommend dc to home with HHOT for further OT to optimize safety, independence with ADLs, and return to PLOF.  Answered all pt questions in preparation for dc later today.      Recommendations for follow up therapy are one component of a multi-disciplinary discharge planning process, led by the attending physician.  Recommendations may be updated based on patient status, additional functional criteria and insurance authorization.   Follow Up Recommendations  Home health OT    Assistance Recommended at Discharge Frequent or constant Supervision/Assistance  Patient can return home with the following      Functional Status Assessment  Patient has had a recent decline in their functional status and demonstrates the ability to make significant improvements in function in a reasonable and predictable amount of time.  Equipment Recommendations  None recommended by OT    Recommendations for Other Services       Precautions / Restrictions Precautions Precautions: Fall Restrictions Weight Bearing Restrictions: No      Mobility Bed Mobility Overal bed mobility: Needs Assistance Bed Mobility: Supine to Sit, Sit to Supine     Supine to sit: Min assist Sit to supine: Min guard   General bed mobility comments: Min A for bringing L hip  towards EOB. Min Guard A for safety in returning to supine    Transfers Overall transfer level: Needs assistance Equipment used: Straight cane, Rolling walker (2 wheels) Transfers: Sit to/from Stand Sit to Stand: Min assist, +2 safety/equipment           General transfer comment: Min A for lift assist and steadying.      Balance Overall balance assessment: Needs assistance Sitting-balance support: No upper extremity supported, Feet supported Sitting balance-Leahy Scale: Fair     Standing balance support: No upper extremity supported, During functional activity Standing balance-Leahy Scale: Fair Standing balance comment: able to perform hand hygiene and toilet hygiene without support                           ADL either performed or assessed with clinical judgement   ADL Overall ADL's : Needs assistance/impaired Eating/Feeding: Set up;Sitting   Grooming: Set up;Sitting   Upper Body Bathing: Supervision/ safety;Set up;Sitting   Lower Body Bathing: Minimal assistance;Sit to/from stand   Upper Body Dressing : Supervision/safety;Set up;Sitting   Lower Body Dressing: Minimal assistance   Toilet Transfer: Minimal assistance;+2 for safety/equipment;Ambulation;Regular Toilet;Rolling walker (2 wheels);Grab bars Toilet Transfer Details (indicate cue type and reason): Min A for power up         Functional mobility during ADLs: Minimal assistance;+2 for safety/equipment;Rolling walker (2 wheels) General ADL Comments: Decreased strength and balance compared to baseline.     Vision         Perception     Praxis      Pertinent  Vitals/Pain Pain Assessment Pain Assessment: No/denies pain     Hand Dominance Right   Extremity/Trunk Assessment Upper Extremity Assessment Upper Extremity Assessment: Generalized weakness   Lower Extremity Assessment Lower Extremity Assessment: Defer to PT evaluation   Cervical / Trunk Assessment Cervical / Trunk  Assessment: Kyphotic   Communication Communication Communication: HOH   Cognition Arousal/Alertness: Awake/alert Behavior During Therapy: WFL for tasks assessed/performed Overall Cognitive Status: History of cognitive impairments - at baseline                                 General Comments: Dementia at baseline     General Comments  Pt's grandson present    Exercises     Shoulder Instructions      Home Living Family/patient expects to be discharged to:: Private residence Living Arrangements: Alone Available Help at Discharge: Personal care attendant;Available 24 hours/day Type of Home: House Home Access: Stairs to enter [employer] of Steps: 2 Entrance Stairs-Rails: Right;Left Home Layout: One level     Bathroom Shower/Tub: Teacher, early years/pre: Standard     Home Equipment: Cane - single Barista (2 wheels)          Prior Functioning/Environment Prior Level of Function : Needs assist             Mobility Comments: Supervision for ambulation using cane ADLs Comments: Supervision for ADLs. Family takes pt to grocery shop and aides assist with cleaning and cooking.        OT Problem List: Decreased strength;Decreased range of motion;Decreased activity tolerance;Impaired balance (sitting and/or standing);Decreased knowledge of use of DME or AE;Decreased knowledge of precautions      OT Treatment/Interventions: Self-care/ADL training;Therapeutic exercise;Energy conservation;DME and/or AE instruction;Therapeutic activities;Patient/family education    OT Goals(Current goals can be found in the care plan section) Acute Rehab OT Goals Patient Stated Goal: Go home OT Goal Formulation: All assessment and education complete, DC therapy  OT Frequency: Min 2X/week    Co-evaluation              AM-PAC OT "6 Clicks" Daily Activity     Outcome Measure Help from another person eating meals?: None Help from  another person taking care of personal grooming?: A Little Help from another person toileting, which includes using toliet, bedpan, or urinal?: A Little Help from another person bathing (including washing, rinsing, drying)?: A Little Help from another person to put on and taking off regular upper body clothing?: A Little Help from another person to put on and taking off regular lower body clothing?: A Little 6 Click Score: 19   End of Session Equipment Utilized During Treatment: Rolling walker (2 wheels) Nurse Communication: Mobility status  Activity Tolerance: Patient tolerated treatment well Patient left: in bed;with call bell/phone within reach;with family/visitor present  OT Visit Diagnosis: Unsteadiness on feet (R26.81);Other abnormalities of gait and mobility (R26.89);Muscle weakness (generalized) (M62.81)                Time: 8341-9622 OT Time Calculation (min): 25 min Charges:  OT General Charges $OT Visit: 1 Visit OT Evaluation $OT Eval Low Complexity: 1 Low  Paris [place] MSOT, OTR/L Acute Rehab Office: [place] 01/01/2022, 12:32 PM

## 2022-01-01 NOTE — Telephone Encounter (Signed)
Patient admitted to hospital.

## 2022-01-01 NOTE — Addendum Note (Signed)
Addended by: Lynda Rainwater on: 01/01/2022 11:26 AM   Modules accepted: Orders

## 2022-01-05 NOTE — Progress Notes (Unsigned)
Cardiology Office Note Date:  01/05/2022  Patient ID:  [name], [name] 04/22/1924, MRN [id number] PCP:  [name], MD  Cardiologist:  Dr. Fletcher Anon (PVD) Electrophysiologist: Dr. Lovena Le    Chief Complaint: post hospital  History of Present Illness: [name] is a 88 y.o. male with history of stroke (remotely), HTN, DM, tachy-brady w/PPM, AFib, PVD (L popliteal intervention), CKD (IIIB)  He come sin today to be seen for Dr. Lovena Le, last seen by him Nov 2021, was doing well, in AFib w/CVR, was asymptomatic.  Noted presumably via his device some increased HRs though without symptoms, no changes made.  Saw Dr. Fletcher Anon Feb 2022, doing a bit better, with some falls and weight loss the prior year with some discussion perhaps of stopping [place], but  reported more stable at that time with excellent family support, continued. Pt c/o b/l LE discoloration, suspected more likely 2/2 venous insufficiency  Saw PMD 11/30/21, apparently had been in a SNF [place] though did not go well, discussed low BPs causing stopping his meds, urology had started [place].  There for a concern about his penile swelling Started on ketoconazole 2%-triamcinolone 0.1% 1:2 cream mixture for balanitis.  12/05/21 saw primary again, reported urinary hesitancy, scrotal swelling, LE edema, pending urology visit 21st Referred to the ER 12/05/21, ER visit with LE/scrotal swelling, no SOB, given/started on a short course of lasix to f/u with already scheduled nephrology and make cardiac appt as well. BUN/Creat 23/1.91 (priors 1.5, 1.93) H/H 9.5/28)  I saw him 12/10/21 He is accompanied by his daughter/family He is better, swelling is improved significantly not resolved He denies any kid of SOB No near syncope or syncope. No bleeding or signs of bleeding He saw urology, they felt he was fine, no hematuria reported No bleeding or signs of bleeding Since he was having good response to low dose diuretic, this  was continued, planned to update his labs and echo Pacer was nearing ERI Planned for a 74movisit to better assess HR data/control and volume  He saw Dr. AFletcher Anonthe next day 12/11/21, LE discoloration likely 2/2 venous insufficiency, c/o some LE discomfort had low suspicion of progressive PVD but planned for ABI. Suspected he may require higher lasix dose given renal disease.  TTE noted preserved LVEF, mod LVH, mild reduction in RVEF, no severe VHD Comparison(s): No significant change from prior study. Prior images  reviewed side by side.  Conclusion(s)/Recommendation(s): Multiple findings suggest infiltrative  cardiomyopathy (e.g. amyloidosis), but the lack of progression since 2018  would be surprising with amyloidosis.     Admitted 12/31/21 - 01/01/22, [facility] HR 120's, with no report of symptoms, some mention of facial droop, eventialy reported by family as his baseline, EMS found stable vitals though RN insisted on transport. He was found anemic with + FOB and suspect GIB. Hb dropped from 9.5 in July >> 7.2, 7.5, 8.0  GI consulted no plans for endoscopic eval, kept on PPI and home [place]umed Discharged with Hgb 8.0 Creat 2.23 (baseline about 1.9)   TODAY He is accompanied by his son. The patient feels like he is doing better Denies any CP, palpitations or cardiac awareness. No near syncope or syncope. Denies SOB  His son agrees mostly, agrees he is doing better, edema has definitely remained much better Finds his Dad a little weak overall. They see his PMD next week for post hospital visit  Home BPs anywhere from 90's/40's-120's/70's  Device information MD dual chamber PPM implanted 09/26/2004, gen change 04/06/2012   Past Medical History:  Diagnosis Date   Arthritis    Atrial fibrillation ([place])    CKD (chronic kidney disease)    History of hiatal hernia    Hx SBO    Last 2013 all treated conservatively   Hypertension    Presence of  permanent cardiac pacemaker    Prostate cancer ([place])    Stroke ([place]) 2017   left facial drooping noted 08/14/2016   Tachycardia-bradycardia syndrome ([place])    Type II diabetes mellitus ([place])    Ventral hernia    x3    Past Surgical History:  Procedure Laterality Date   ABDOMINAL AORTOGRAM W/LOWER EXTREMITY N/A 08/07/2016   Procedure: Abdominal Aortogram w/Lower Extremity;  Surgeon: Wellington Hampshire, MD;  Location: [place] [facility];  Service: Cardiovascular;  Laterality: N/A;   CHOLECYSTECTOMY OPEN  03/31/2001   Dr Lindon Romp   COLON SURGERY     EXPLORATORY LAPAROTOMY  08/2004   Archie Endo 10/01/2010   HEMORRHOID SURGERY     HERNIA REPAIR     HIATAL HERNIA REPAIR  2002   INCISIONAL HERNIA REPAIR  08/2004   Archie Endo 10/01/2010   INSERT / REPLACE / REMOVE PACEMAKER     PERIPHERAL VASCULAR BALLOON ANGIOPLASTY  08/07/2016   Procedure: Peripheral Vascular Balloon Angioplasty;  Surgeon: Wellington Hampshire, MD;  Location: [place] [facility];  Service: Cardiovascular;;  left popliteal artery   PERIPHERAL VASCULAR INTERVENTION  08/14/2016   popliteal artery/notes 08/14/2016   PERIPHERAL VASCULAR INTERVENTION Left 08/14/2016   Procedure: Peripheral Vascular Intervention;  Surgeon: Wellington Hampshire, MD;  Location: [place] [facility];  Service: Cardiovascular;  Laterality: Left;  popliteal artery   PERMANENT PACEMAKER GENERATOR CHANGE N/A 04/06/2012   Procedure: PERMANENT PACEMAKER GENERATOR CHANGE;  Surgeon: Evans Lance, MD;  Location: [facility];  Service: Cardiovascular;  Laterality: N/A;   SMALL INTESTINE SURGERY  09/16/2004   SBO resection, VH repair    Current Outpatient Medications  Medication Sig Dispense Refill   atorvastatin (LIPITOR) 10 MG tablet Take 1 tablet by mouth at bedtime.     docusate sodium (COLACE) 100 MG capsule Take 1 capsule (100 mg total) by mouth every other day.     ergocalciferol (VITAMIN D2) 1.25 MG (50000 UT) capsule Take 50,000 Units by mouth once a week.     ferrous  sulfate (FEROSUL) 325 (65 FE) MG tablet Take 1 tablet (325 mg total) by mouth every other day. (Patient taking differently: Take 325 mg by mouth daily.) 90 tablet 2   folic acid (FOLVITE) 1 MG tablet Take 1 tablet (1 mg total) by mouth daily. 90 tablet 3   furosemide (LASIX) 20 MG tablet Take 1 tablet (20 mg total) by mouth daily. 90 tablet 1   haloperidol (HALDOL) 2 MG tablet Take 1 tablet (2 mg total) by mouth 2 (two) times daily. (Patient taking differently: Take 2 mg by mouth at bedtime.) 30 tablet 1   JARDIANCE 25 MG TABS tablet Take by mouth.     ketoconazole (NIZORAL) 2 % cream Apply topically.     ketoconazole 2%-triamcinolone 0.1% 1:2 cream mixture Apply topically daily. Apply to foreskin daily for 10 days. (Patient not taking: Reported on 01/01/2022) 45 g 1   latanoprost (XALATAN) 0.005 % ophthalmic solution Place 1 drop into both eyes at bedtime.     memantine (NAMENDA) 10 MG tablet Take 1 tablet (10 mg total) by mouth 2 (two)  times daily. 180 tablet 3   metoprolol succinate (TOPROL-XL) 25 MG 24 hr tablet Take 1 tablet (25 mg total) by mouth daily. 30 tablet 0   Nutritional Supplements (GLUCERNA ADVANCE SHAKE PO) Take 1 Bottle by mouth daily as needed (supplement).     pantoprazole (PROTONIX) 40 MG tablet TAKE 1 TABLET(40 MG) BY MOUTH DAILY (Patient taking differently: Take 40 mg by mouth daily.) 90 tablet 1   polyethylene glycol powder (GLYCOLAX/MIRALAX) 17 GM/SCOOP powder Take 17 g by mouth 2 (two) times daily as needed. 3350 g 1   tamsulosin (FLOMAX) 0.4 MG CAPS capsule Take 1 capsule (0.4 mg total) by mouth daily. 90 capsule 3   vitamin B-12 (CYANOCOBALAMIN) 500 MCG tablet Take 500 mcg by mouth daily.     XARELTO 15 MG TABS tablet TAKE 1 TABLET(15 MG) BY MOUTH DAILY WITH SUPPER (Patient taking differently: Take 15 mg by mouth daily with supper.) 30 tablet 3   Current Facility-Administered Medications  Medication Dose Route Frequency Provider Last Rate Last Admin   divalproex  (DEPAKOTE SPRINKLE) capsule 250 mg  250 mg Oral Q12H Garvin Fila, MD        Allergies:   Patient has no known allergies.   Social History:  The patient  reports that he quit smoking about 66 years ago. His smoking use included cigarettes. He has a 22.50 pack-year smoking history. He has been exposed to tobacco smoke. He quit smokeless tobacco use about 69 years ago.  His smokeless tobacco use included chew. He reports that he does not drink alcohol and does not use drugs.   Family History:  The patient's family history includes Cancer in his brother; Pneumonia in his father; Stroke in his brother and sister.  ROS:  Please see the history of present illness.    All other systems are reviewed and otherwise negative.   PHYSICAL EXAM:  VS:  There were no vitals taken for this visit. BMI: There is no height or weight on file to calculate BMI. Well nourished, well developed, in no acute distress HEENT: normocephalic, atraumatic Neck: no JVD, carotid bruits or masses Cardiac:  irreg-irreg; no significant murmurs, no rubs, or gallops Lungs:  CTA b/l, no wheezing, rhonchi or rales Abd: soft, nontender, large hermia MS: no deformity or age appropriate atrophy Ext: trace edema Skin: warm and dry, no rash Neuro:  No gross deficits appreciated Psych: euthymic mood, full affect  PPM site is stable, no tethering or discomfort   EKG:  not done today  Device interrogation done today and reviewed by myself:  Battery and lead measurements are good HR histogram not great, spends a fair amount of time 100-120bpm  12/18/21: TTE  1. Left ventricular ejection fraction, by estimation, is 55 to 60%. The  left ventricle has normal function. The left ventricle has no regional  wall motion abnormalities. There is moderate left ventricular hypertrophy.  Left ventricular diastolic function   could not be evaluated.   2. Right ventricular systolic function is mildly reduced. The right  ventricular size  is moderately enlarged. There is mildly elevated  pulmonary artery systolic pressure. The estimated right ventricular  systolic pressure is 56.8 mmHg.   3. Left atrial size was severely dilated.   4. Right atrial size was severely dilated.   5. A small pericardial effusion is present. The pericardial effusion is  circumferential. There is no evidence of cardiac tamponade.   6. The mitral valve is normal in structure. Trivial mitral valve  regurgitation.  7. Tricuspid valve regurgitation is mild to moderate.   8. The aortic valve is tricuspid. There is mild calcification of the  aortic valve. Aortic valve regurgitation is mild. Aortic valve sclerosis  is present, with no evidence of aortic valve stenosis.   9. Aortic dilatation noted. There is mild dilatation of the aortic root,  measuring 42 mm.  10. The inferior vena cava is dilated in size with >50% respiratory  variability, suggesting right atrial pressure of 8 mmHg.  Comparison(s): No significant change from prior study. Prior images  reviewed side by side.   Conclusion(s)/Recommendation(s): Multiple findings suggest infiltrative  cardiomyopathy (e.g. amyloidosis), but the lack of progression since 2018  would be surprising with amyloidosis.    07/13/20: ABI/US Summary:  Right: Resting right ankle-brachial index is within normal range. No  evidence of significant right lower extremity arterial disease. The right  toe-brachial index is normal.   TBIs increased by .24.   Left: Resting left ankle-brachial index is within normal range. No  evidence of significant left lower extremity arterial disease. The left  toe-brachial index is normal.   TBIs increased by .11.   Suggest follow up study in 12 months.  Art [place] LE Summary:  Left: No significant change as compared to previous study. Mild  heterogenous plaque throughout.  Patent distal popliteal artery to mid TPT stent without evidence of  stenosis.  Patent distal TPT to  proximal PTA stent with stable elevated velocities in  the proximal stent segment suggesting 50-99% restenosis; stenosis based on  velocities.    11/08/2016: TTE Study Conclusions  - Left ventricle: The cavity size was severely reduced. Wall    thickness was increased in a pattern of severe LVH. Systolic    function was normal. The estimated ejection fraction was in the    range of 50% to 55%.  - Left atrium: The atrium was severely dilated.  - Right atrium: The atrium was severely dilated.  Recent Labs: 12/05/2021: B Natriuretic Peptide 531.7 12/31/2021: ALT 15; Magnesium 2.0; TSH 2.092 01/01/2022: BUN 31; Creatinine, Ser 2.23; Hemoglobin 8.0; Platelets 171; Potassium 4.3; Sodium 142  No results found for requested labs within last 365 days.   CrCl cannot be calculated (Unknown ideal weight.).   Wt Readings from Last 3 Encounters:  12/11/21 154 lb (69.9 kg)  12/10/21 155 lb 6.4 oz (70.5 kg)  12/05/21 160 lb 15 oz (73 kg)     Other studies reviewed: Additional studies/records reviewed today include: summarized above  ASSESSMENT AND PLAN:  PPM Intact function, no programming changes made Nearing ERI, not yet reached Esimate <78mo Permanent AFib CHA2DS2Vasc is 7, on Xarelto appropriately dosed Rates aren't ideal, BP won;lt allow more BB, HR today here 90's-100's Follow  Hopefully ash his H/H improves so will his HR  PVD Denies claudication  Reports cold feet especially at noght Following with Dr. AFletcher Anon volume OL Mod LVH on his echo, preserved LVEF, mild reduction in RVEF Edema nearly resolved with trace appreciate only today No changes BMET  5.   HTN Now with relative hypotension Perhaps 2/2 anemia yet    Disposition: have him back in 3-4 weeks, sooner if needed, suspect he may be ERI then  Current medicines are reviewed at length with the patient today.  The patient did not have any concerns regarding medicines.  SVenetia Night PA-C 01/05/2022 4:21  PM     [place] [phone](office)  [phone] (fax)

## 2022-01-07 ENCOUNTER — Telehealth: Payer: Self-pay | Admitting: Family Medicine

## 2022-01-07 NOTE — Telephone Encounter (Signed)
Grandaughter called and asked if the med Harditol is maybe too strong for twice a day. His BP is low and his sugar is 232. Please call asap

## 2022-01-07 NOTE — Telephone Encounter (Signed)
Caller Name: [name] Call back phone #: [phone]  Reason for Call: Granddaughter called again worried about BP. Wanting to know if she should hold dose for this evening haloperidol (HALDOL) 2 MG tablet. She said he is out of it and BP low 90/60. Please advise.

## 2022-01-07 NOTE — Telephone Encounter (Signed)
[place] called her # is [phone] and she stated that the pt BP WAS 90/60 and after exercising it was 100/970 and now it is 95-60. Mrs Georgia Dom aslo stated that she would like the pt to get on medication

## 2022-01-08 ENCOUNTER — Encounter: Payer: Self-pay | Admitting: Physician Assistant

## 2022-01-08 ENCOUNTER — Ambulatory Visit (INDEPENDENT_AMBULATORY_CARE_PROVIDER_SITE_OTHER): Payer: Medicare PPO | Admitting: Physician Assistant

## 2022-01-08 VITALS — BP 100/58 | HR 111 | Ht 71.0 in | Wt 136.8 lb

## 2022-01-08 DIAGNOSIS — I5032 Chronic diastolic (congestive) heart failure: Secondary | ICD-10-CM | POA: Diagnosis not present

## 2022-01-08 DIAGNOSIS — I10 Essential (primary) hypertension: Secondary | ICD-10-CM | POA: Diagnosis not present

## 2022-01-08 DIAGNOSIS — Z950 Presence of cardiac pacemaker: Secondary | ICD-10-CM | POA: Diagnosis not present

## 2022-01-08 DIAGNOSIS — Z79899 Other long term (current) drug therapy: Secondary | ICD-10-CM | POA: Diagnosis not present

## 2022-01-08 DIAGNOSIS — I4821 Permanent atrial fibrillation: Secondary | ICD-10-CM

## 2022-01-08 LAB — CUP PACEART INCLINIC DEVICE CHECK
Battery Impedance: 7136 Ohm
Battery Remaining Longevity: 1 mo
Battery Voltage: 2.6 V
Brady Statistic RV Percent Paced: 19 %
Date Time Interrogation Session: 20230822191611
Implantable Lead Implant Date: 20060510
Implantable Lead Location: 753860
Implantable Lead Model: 5076
Implantable Pulse Generator Implant Date: 20131118
Lead Channel Impedance Value: 0 Ohm
Lead Channel Impedance Value: 427 Ohm
Lead Channel Pacing Threshold Amplitude: 1 V
Lead Channel Pacing Threshold Amplitude: 1.25 V
Lead Channel Pacing Threshold Pulse Width: 0.4 ms
Lead Channel Pacing Threshold Pulse Width: 0.4 ms
Lead Channel Sensing Intrinsic Amplitude: 15.67 mV
Lead Channel Setting Pacing Amplitude: 2.5 V
Lead Channel Setting Pacing Pulse Width: 0.4 ms
Lead Channel Setting Sensing Sensitivity: 5.6 mV

## 2022-01-08 LAB — BASIC METABOLIC PANEL
BUN/Creatinine Ratio: 12 (ref 10–24)
BUN: 25 mg/dL (ref 10–36)
CO2: 23 mmol/L (ref 20–29)
Calcium: 8.2 mg/dL — ABNORMAL LOW (ref 8.6–10.2)
Chloride: 104 mmol/L (ref 96–106)
Creatinine, Ser: 2.02 mg/dL — ABNORMAL HIGH (ref 0.76–1.27)
Glucose: 170 mg/dL — ABNORMAL HIGH (ref 70–99)
Potassium: 4.6 mmol/L (ref 3.5–5.2)
Sodium: 141 mmol/L (ref 134–144)
eGFR: 30 mL/min/{1.73_m2} — ABNORMAL LOW (ref >59)

## 2022-01-08 NOTE — Telephone Encounter (Signed)
Caller Name: Edrick Kins Call back phone #: [phone]  Reason for Call: Asked if he could take 1/2 pill morning and 1 pill at night of his Haloperidol. Also had a question about a Free style machine for bp

## 2022-01-08 NOTE — Patient Instructions (Addendum)
Medication Instructions:   Your physician recommends that you continue on your current medications as directed. Please refer to the Current Medication list given to you today.  *If you need a refill on your cardiac medications before your next appointment, please call your pharmacy*   Lab Work: BMET TODAY    If you have labs (blood work) drawn today and your tests are completely normal, you will receive your results only by: [place] (if you have MyChart) OR A paper copy in the mail If you have any lab test that is abnormal or we need to change your treatment, we will call you to review the results.   Testing/Procedures: NONE ORDERED  TODAY    Follow-Up: At [facility], you and your health needs are our priority.  As part of our continuing mission to provide you with exceptional heart care, we have created designated Provider Care Teams.  These Care Teams include your primary Cardiologist (physician) and Advanced Practice Providers (APPs -  Physician Assistants and Nurse Practitioners) who all work together to provide you with the care you need, when you need it.  We recommend signing up for the patient portal called "MyChart".  Sign up information is provided on this After Visit Summary.  MyChart is used to connect with patients for Virtual Visits (Telemedicine).  Patients are able to view lab/test results, encounter notes, upcoming appointments, etc.  Non-urgent messages can be sent to your provider as well.   To learn more about what you can do with MyChart, go to [email or web address].    Your next appointment:   3 week(s)  The format for your next appointment:   In Person  Provider:   Legrand Como "Oda Kilts, PA-C    Other Instructions   Important Information About Sugar

## 2022-01-09 NOTE — Telephone Encounter (Signed)
Returned patients grand daughters call for clarification on medication if they wanted to hold on Haldol or break up the doses, also wanted to check on patient and BP. No answer LMTCB

## 2022-01-09 NOTE — Telephone Encounter (Signed)
Spoke with patient granddaughter who states that patient seems to be out of it and sleeping a lot also making BP lower than normal. Asking if patient could take 1/2 pill in the morning and whole pill at night? This seems to work better, they have also started checking glucose more and would like to know if Rx for One Touch kit could be sent to pharmacy. Please advise.

## 2022-01-10 NOTE — Addendum Note (Signed)
Addended by: Drue Novel I on: 01/10/2022 10:45 AM   Modules accepted: Orders

## 2022-01-11 ENCOUNTER — Encounter: Payer: Self-pay | Admitting: Family Medicine

## 2022-01-15 ENCOUNTER — Ambulatory Visit (INDEPENDENT_AMBULATORY_CARE_PROVIDER_SITE_OTHER): Payer: Medicare PPO | Admitting: Family Medicine

## 2022-01-15 ENCOUNTER — Encounter: Payer: Self-pay | Admitting: Family Medicine

## 2022-01-15 VITALS — BP 110/68 | HR 102 | Temp 98.1°F | Ht 71.0 in | Wt 136.6 lb

## 2022-01-15 DIAGNOSIS — N401 Enlarged prostate with lower urinary tract symptoms: Secondary | ICD-10-CM

## 2022-01-15 DIAGNOSIS — G4700 Insomnia, unspecified: Secondary | ICD-10-CM | POA: Diagnosis not present

## 2022-01-15 DIAGNOSIS — N138 Other obstructive and reflux uropathy: Secondary | ICD-10-CM | POA: Diagnosis not present

## 2022-01-15 MED ORDER — QUETIAPINE FUMARATE 25 MG PO TABS: 25.0000 mg | ORAL_TABLET | Freq: Every day | ORAL | 2 refills | Status: DC

## 2022-01-15 MED ORDER — FINASTERIDE 5 MG PO TABS: 5.0000 mg | ORAL_TABLET | Freq: Every day | ORAL | 1 refills | Status: DC

## 2022-01-15 NOTE — Progress Notes (Signed)
Established Patient Office Visit  Subjective   Patient ID: [name], male    DOB: 03-01-1924  Age: 88 y.o. MRN: [id number]  Chief Complaint  Patient presents with   Follow-up    Follow up on labs concerns about behavior issues seems very angry discuss how patient should be taking Haldol not sleeping at night. Sister passed last night seems to be making things worse.     HPI follow-up of behavior issues associated with cognitive disorder treated with Haldol.  Haldol does not seem to be helping much especially with sleep at night.  Patient continues to become combative at times.  Most recent example is he has hidden all of his medicines from the family.  His son-in-law is staying with him throughout the night.  Continues to have difficulties with urine flow and periodic incontinence.  He did see urology.    Review of Systems  Constitutional: Negative.   HENT:  Positive for hearing loss.   Eyes:  Negative for blurred vision, discharge and redness.  Respiratory: Negative.    Cardiovascular: Negative.   Gastrointestinal:  Negative for abdominal pain.  Genitourinary: Negative.   Musculoskeletal: Negative.  Negative for myalgias.  Skin:  Negative for rash.  Neurological:  Negative for tingling, loss of consciousness and weakness.  Endo/Heme/Allergies:  Negative for polydipsia.  Psychiatric/Behavioral:  Positive for memory loss.       Objective:     BP 110/68 (BP Location: Right Arm, Patient Position: Sitting, Cuff Size: Normal)   Pulse (!) 102   Temp 98.1 F (36.7 C) (Temporal)   Ht '5\' 11"'$  (1.803 m)   Wt 136 lb 9.6 oz (62 kg)   SpO2 99%   BMI 19.05 kg/m    Physical Exam Constitutional:      General: He is not in acute distress.    Appearance: Normal appearance. He is not ill-appearing, toxic-appearing or diaphoretic.  HENT:     Head: Normocephalic and atraumatic.     Right Ear: External ear normal.     Left Ear: External ear normal.  Eyes:     General: No  scleral icterus.       Right eye: No discharge.        Left eye: No discharge.     Extraocular Movements: Extraocular movements intact.     Conjunctiva/sclera: Conjunctivae normal.  Pulmonary:     Effort: No respiratory distress.  Skin:    General: Skin is warm and dry.  Neurological:     Mental Status: He is alert and oriented to person, place, and time.  Psychiatric:        Mood and Affect: Mood normal.        Behavior: Behavior normal.      No results found for any visits on 01/15/22.    The ASCVD Risk score (Arnett DK, et al., 2019) failed to calculate for the following reasons:   The 2019 ASCVD risk score is only valid for ages 75 to 73   The patient has a prior MI or stroke diagnosis    Assessment & Plan:   Problem List Items Addressed This Visit       Genitourinary   Benign prostatic hyperplasia with urinary obstruction   Relevant Medications   finasteride (PROSCAR) 5 MG tablet     Other   Insomnia - Primary   Relevant Medications   QUEtiapine (SEROQUEL) 25 MG tablet    Return in about 6 weeks (around 02/26/2022).  Discontinue Haldol.  We  will start Seroquel at at bedtime.  Continue tamsulosin and add Proscar.  Advised patient's son and well that it may take several weeks for to become effective.  [name], MD

## 2022-01-16 ENCOUNTER — Telehealth: Payer: Self-pay | Admitting: Family Medicine

## 2022-01-16 NOTE — Telephone Encounter (Signed)
Returned call no answer LM on identified VM regarding medication also asked to call with any questions. Sent message via Mychart as well.

## 2022-01-16 NOTE — Telephone Encounter (Signed)
Caller Name: Alton Revere   Call back phone #: [phone]  Reason for Call: Grandaughter needs to ask questions about his meds she is unsure if they all need to go in his pill box. Please call or leave VM

## 2022-01-17 NOTE — Telephone Encounter (Signed)
[name] is needing to speak with you, she has several questions concerning his sleep aid pills. Please advise her @ [phone]

## 2022-01-17 NOTE — Telephone Encounter (Signed)
Spoke with patients grand daughter who's calling wanting to know if patient needs to have a blood transfusion they were told at the hospital that patients labs were low and he needed this but they never had it preformed. Also wanting to know if they could have a prescription for one touch glucose meter and lancets to check patient sugar levels? Please advise.

## 2022-01-17 NOTE — Telephone Encounter (Signed)
Spoke with patients grand daughter who states that she will give [place] a call back to schedule appointment after she checks her schedule to see when she can be off.

## 2022-01-19 ENCOUNTER — Emergency Department (HOSPITAL_BASED_OUTPATIENT_CLINIC_OR_DEPARTMENT_OTHER)
Admission: EM | Admit: 2022-01-19 | Discharge: 2022-01-19 | Disposition: A | Discharge status: Home / Self Care | Payer: Medicare PPO | Attending: Emergency Medicine | Admitting: Emergency Medicine

## 2022-01-19 ENCOUNTER — Encounter (HOSPITAL_BASED_OUTPATIENT_CLINIC_OR_DEPARTMENT_OTHER): Payer: Self-pay | Admitting: Emergency Medicine

## 2022-01-19 ENCOUNTER — Other Ambulatory Visit: Payer: Self-pay

## 2022-01-19 DIAGNOSIS — N189 Chronic kidney disease, unspecified: Secondary | ICD-10-CM | POA: Insufficient documentation

## 2022-01-19 DIAGNOSIS — I4891 Unspecified atrial fibrillation: Secondary | ICD-10-CM | POA: Insufficient documentation

## 2022-01-19 DIAGNOSIS — E1122 Type 2 diabetes mellitus with diabetic chronic kidney disease: Secondary | ICD-10-CM | POA: Diagnosis not present

## 2022-01-19 DIAGNOSIS — D649 Anemia, unspecified: Secondary | ICD-10-CM | POA: Diagnosis not present

## 2022-01-19 DIAGNOSIS — I129 Hypertensive chronic kidney disease with stage 1 through stage 4 chronic kidney disease, or unspecified chronic kidney disease: Secondary | ICD-10-CM | POA: Insufficient documentation

## 2022-01-19 DIAGNOSIS — Z79899 Other long term (current) drug therapy: Secondary | ICD-10-CM | POA: Insufficient documentation

## 2022-01-19 DIAGNOSIS — E860 Dehydration: Secondary | ICD-10-CM

## 2022-01-19 DIAGNOSIS — Z8546 Personal history of malignant neoplasm of prostate: Secondary | ICD-10-CM | POA: Diagnosis not present

## 2022-01-19 LAB — CBC WITH DIFFERENTIAL/PLATELET
Abs Immature Granulocytes: 0.03 10*3/uL (ref 0.00–0.07)
Basophils Absolute: 0 10*3/uL (ref 0.0–0.1)
Basophils Relative: 1 %
Eosinophils Absolute: 0.1 10*3/uL (ref 0.0–0.5)
Eosinophils Relative: 1 %
HCT: 28 % — ABNORMAL LOW (ref 39.0–52.0)
Hemoglobin: 9.1 g/dL — ABNORMAL LOW (ref 13.0–17.0)
Immature Granulocytes: 0 %
Lymphocytes Relative: 16 %
Lymphs Abs: 1.4 10*3/uL (ref 0.7–4.0)
MCH: 25.6 pg — ABNORMAL LOW (ref 26.0–34.0)
MCHC: 32.5 g/dL (ref 30.0–36.0)
MCV: 78.9 fL — ABNORMAL LOW (ref 80.0–100.0)
Monocytes Absolute: 0.9 10*3/uL (ref 0.1–1.0)
Monocytes Relative: 11 %
Neutro Abs: 6.4 10*3/uL (ref 1.7–7.7)
Neutrophils Relative %: 71 %
Platelets: 152 10*3/uL (ref 150–400)
RBC: 3.55 MIL/uL — ABNORMAL LOW (ref 4.22–5.81)
RDW: 15.9 % — ABNORMAL HIGH (ref 11.5–15.5)
WBC: 8.9 10*3/uL (ref 4.0–10.5)
nRBC: 0 % (ref 0.0–0.2)

## 2022-01-19 LAB — URINALYSIS, ROUTINE W REFLEX MICROSCOPIC
Bilirubin Urine: NEGATIVE
Glucose, UA: >1000 mg/dL — AB
Hgb urine dipstick: NEGATIVE
Ketones, ur: NEGATIVE mg/dL
Leukocytes,Ua: NEGATIVE
Nitrite: NEGATIVE
Protein, ur: NEGATIVE mg/dL
Specific Gravity, Urine: 1.01 (ref 1.005–1.030)
pH: 5.5 (ref 5.0–8.0)

## 2022-01-19 LAB — COMPREHENSIVE METABOLIC PANEL
ALT: 13 U/L (ref 0–44)
AST: 21 U/L (ref 15–41)
Albumin: 4 g/dL (ref 3.5–5.0)
Alkaline Phosphatase: 59 U/L (ref 38–126)
Anion gap: 9 (ref 5–15)
BUN: 31 mg/dL — ABNORMAL HIGH (ref 8–23)
CO2: 27 mmol/L (ref 22–32)
Calcium: 8.7 mg/dL — ABNORMAL LOW (ref 8.9–10.3)
Chloride: 107 mmol/L (ref 98–111)
Creatinine, Ser: 2.37 mg/dL — ABNORMAL HIGH (ref 0.61–1.24)
GFR, Estimated: 25 mL/min — ABNORMAL LOW (ref >60)
Glucose, Bld: 141 mg/dL — ABNORMAL HIGH (ref 70–99)
Potassium: 4.3 mmol/L (ref 3.5–5.1)
Sodium: 143 mmol/L (ref 135–145)
Total Bilirubin: 0.4 mg/dL (ref 0.3–1.2)
Total Protein: 6.3 g/dL — ABNORMAL LOW (ref 6.5–8.1)

## 2022-01-19 MED ORDER — SODIUM CHLORIDE 0.9 % IV BOLUS
500.0000 mL | Freq: Once | INTRAVENOUS | Status: AC
  Administered 2022-01-19: 500 mL via INTRAVENOUS

## 2022-01-19 NOTE — ED Triage Notes (Signed)
Brought in by granddaughter. Reports that patient has "not been feeling well" Not eating well. Complaining of leg cramps and lower Bps then his normal per family.  Family also reports that he had lowe h/h recently and is concerned that his levels are lower.

## 2022-01-19 NOTE — ED Provider Notes (Signed)
[facility] EMERGENCY DEPT Provider Note   CSN: [id number] Arrival date & time: 01/19/22  1933     History  Chief Complaint  Patient presents with   leg cramp    [name] is a 88 y.o. male.  Patient is a 88 year old male who presents with weakness.  He is here with his granddaughter who also provides history.  He has a history of tachybradycardia syndrome with a pacemaker, atrial fibrillation, hypertension, ventral abdominal hernia, prostate cancer, diabetes, prior stroke and chronic kidney disease.  The granddaughter states that over the last week he has had some decreased appetite and today seemed a little weaker than normal.  He seemed to be okay earlier today but after dinner he seemed a little weaker.  He did not eat much at dinner.  She was concerned because his blood pressures been a little bit lower today than normal.  However she brought with her a chart of the blood pressures which have been anywhere from 89 systolic to about 283 systolic.  I reviewed the blood pressures that he has had written down over the last 2 to 3 weeks and they seem similar to what they have been today.  They are typically in the low 100s and at times go into the 90s.  He has not had any known fevers.  No vomiting or diarrhea.  No cough or cold symptoms.  Patient does not report any pain.  He denies any chest pain or shortness of breath.  Of note, on chart review he was recently seen at [facility] in the emergency department.  He was admitted overnight for melena in association with heme positive stool and anemia.  He had had a drop in his hemoglobin.  He was seen by GI which felt that this was more acute on chronic anemia and it was not felt that he needed endoscopy/colonoscopy.       Home Medications Prior to Admission medications   Medication Sig Start Date End Date Taking? Authorizing Provider  atorvastatin (LIPITOR) 10 MG tablet Take 1 tablet by mouth at bedtime. 03/28/21    [provider]  docusate sodium (COLACE) 100 MG capsule Take 1 capsule (100 mg total) by mouth every other day. 01/01/22   Geradine Girt, DO  ergocalciferol (VITAMIN D2) 1.25 MG (50000 UT) capsule Take 50,000 Units by mouth once a week.    [provider]  ferrous sulfate (FEROSUL) 325 (65 FE) MG tablet Take 1 tablet (325 mg total) by mouth every other day. 11/30/21   [name], MD  finasteride (PROSCAR) 5 MG tablet Take 1 tablet (5 mg total) by mouth daily. 01/15/22   [name], MD  folic acid (FOLVITE) 1 MG tablet Take 1 tablet (1 mg total) by mouth daily. 11/30/21 11/30/22  [name], MD  furosemide (LASIX) 20 MG tablet Take 1 tablet (20 mg total) by mouth daily. 12/10/21   Baldwin Jamaica, PA-C  JARDIANCE 25 MG TABS tablet Take by mouth. 12/06/21   [provider]  ketoconazole (NIZORAL) 2 % cream Apply topically. 12/03/21   [provider]  ketoconazole 2%-triamcinolone 0.1% 1:2 cream mixture Apply topically daily. Apply to foreskin daily for 10 days. 12/03/21   [name], MD  latanoprost (XALATAN) 0.005 % ophthalmic solution Place 1 drop into both eyes at bedtime. 05/03/20   [provider]  memantine (NAMENDA) 10 MG tablet Take 1 tablet (10 mg total) by mouth 2 (two) times daily.  11/26/21   Garvin Fila, MD  metoprolol succinate (TOPROL-XL) 25 MG 24 hr tablet Take 1 tablet (25 mg total) by mouth daily. 01/02/22   Geradine Girt, DO  Nutritional Supplements (GLUCERNA ADVANCE SHAKE PO) Take 1 Bottle by mouth daily as needed (supplement).    [provider]  pantoprazole (PROTONIX) 40 MG tablet TAKE 1 TABLET(40 MG) BY MOUTH DAILY 01/01/22   [name], MD  polyethylene glycol powder [facility]) 17 GM/SCOOP powder Take 17 g by mouth 2 (two) times daily as needed. 08/03/21   McElwee, Lauren A, NP  QUEtiapine (SEROQUEL) 25 MG tablet Take 1 tablet (25 mg total) by mouth at bedtime.  01/15/22   [name], MD  tamsulosin (FLOMAX) 0.4 MG CAPS capsule Take 1 capsule (0.4 mg total) by mouth daily. 12/04/21   [name], MD  vitamin B-12 (CYANOCOBALAMIN) 500 MCG tablet Take 500 mcg by mouth daily.    [provider]  XARELTO 15 MG TABS tablet TAKE 1 TABLET(15 MG) BY MOUTH DAILY WITH SUPPER 04/03/21   [name], MD      Allergies    Patient has no known allergies.    Review of Systems   Review of Systems  Constitutional:  Positive for appetite change and fatigue. Negative for chills, diaphoresis and fever.  HENT:  Negative for congestion, rhinorrhea and sneezing.   Eyes: Negative.   Respiratory:  Negative for cough, chest tightness and shortness of breath.   Cardiovascular:  Negative for chest pain and leg swelling.  Gastrointestinal:  Negative for abdominal pain, blood in stool, diarrhea, nausea and vomiting.  Genitourinary:  Negative for difficulty urinating, flank pain, frequency and hematuria.  Musculoskeletal:  Negative for arthralgias and back pain.  Skin:  Negative for rash.  Neurological:  Negative for dizziness, speech difficulty, weakness, numbness and headaches.    Physical Exam Updated Vital Signs BP 101/72   Pulse 83   Temp 98.2 F (36.8 C) (Oral)   Resp 18   SpO2 100%  Physical Exam Constitutional:      Appearance: He is well-developed.  HENT:     Head: Normocephalic and atraumatic.  Eyes:     Pupils: Pupils are equal, round, and reactive to light.  Cardiovascular:     Rate and Rhythm: Normal rate and regular rhythm.     Heart sounds: Normal heart sounds.  Pulmonary:     Effort: Pulmonary effort is normal. No respiratory distress.     Breath sounds: Normal breath sounds. No wheezing or rales.  Chest:     Chest wall: No tenderness.  Abdominal:     General: Bowel sounds are normal.     Palpations: Abdomen is soft.     Tenderness: There is no abdominal tenderness. There is no guarding or rebound.      Comments: Large, nontender, easily reducible ventral hernia  Musculoskeletal:        General: Normal range of motion.     Cervical back: Normal range of motion and neck supple.  Lymphadenopathy:     Cervical: No cervical adenopathy.  Skin:    General: Skin is warm and dry.     Findings: No rash.  Neurological:     General: No focal deficit present.     Mental Status: He is alert and oriented to person, place, and time.     Comments: Moves all extremities symmetrically without obvious focal deficits     ED Results / Procedures / Treatments  Labs (all labs ordered are listed, but only abnormal results are displayed) Labs Reviewed  COMPREHENSIVE METABOLIC PANEL - Abnormal; Notable for the following components:      Result Value   Glucose, Bld 141 (*)    BUN 31 (*)    Creatinine, Ser 2.37 (*)    Calcium 8.7 (*)    Total Protein 6.3 (*)    GFR, Estimated 25 (*)    All other components within normal limits  CBC WITH DIFFERENTIAL/PLATELET - Abnormal; Notable for the following components:   RBC 3.55 (*)    Hemoglobin 9.1 (*)    HCT 28.0 (*)    MCV 78.9 (*)    MCH 25.6 (*)    RDW 15.9 (*)    All other components within normal limits  URINALYSIS, ROUTINE W REFLEX MICROSCOPIC - Abnormal; Notable for the following components:   Glucose, UA >1,000 (*)    All other components within normal limits    EKG EKG Interpretation  Date/Time:  Saturday January 19 2022 20:26:30 EDT Ventricular Rate:  96 PR Interval:  175 QRS Duration: 95 QT Interval:  387 QTC Calculation: 490 R Axis:   -9 Text Interpretation: Ventricular-paced complexes No further rhythm analysis attempted due to paced rhythm Consider left atrial enlargement Repol abnrm suggests ischemia, anterolateral similar to prior EKG Confirmed by Malvin Johns [phone]) on 01/19/2022 9:52:20 PM  Radiology No results found.  Procedures Procedures    Medications Ordered in ED Medications  sodium chloride 0.9 % bolus  500 mL (0 mLs Intravenous Stopped 01/19/22 2135)  sodium chloride 0.9 % bolus 500 mL (500 mLs Intravenous New Bag/Given 01/19/22 2222)    ED Course/ Medical Decision Making/ A&P                           Medical Decision Making Amount and/or Complexity of Data Reviewed Labs: ordered.   Patient is a 88 year old who presents with weakness.  EKG was performed which does not show any ischemic changes or arrhythmias.  His labs performed.  These were compared to old labs in his chart.  His hemoglobin has improved since his last visit.  It is up about 1 g.  His creatinine is elevated but similar to prior values.  Other blood work is nonconcerning.  His urinalysis is not consistent with infection.  His blood pressure is borderline low.  It does not seem to be much different than his blood pressures over the last few weeks on review of the chart to the daughter brings in of his home blood pressures.  However he was given some IV fluids for possible dehydration.  He seems to be at his baseline mental status per family.  He was discharged home in good condition.  Return precautions were given.  Final Clinical Impression(s) / ED Diagnoses Final diagnoses:  Dehydration  Anemia, unspecified type    Rx / DC Orders ED Discharge Orders     None         Malvin Johns, MD 01/19/22 2228

## 2022-01-19 NOTE — Discharge Instructions (Signed)
Follow-up with your primary care doctor for recheck.  Return to emergency room if you have any worsening symptoms.

## 2022-01-19 NOTE — ED Notes (Signed)
Pt reminded of need of urine specimen and provided with urinal at this time.

## 2022-01-20 ENCOUNTER — Other Ambulatory Visit: Payer: Self-pay | Admitting: Family Medicine

## 2022-01-20 DIAGNOSIS — I480 Paroxysmal atrial fibrillation: Secondary | ICD-10-CM

## 2022-01-25 ENCOUNTER — Telehealth: Payer: Self-pay | Admitting: *Deleted

## 2022-01-25 NOTE — Telephone Encounter (Signed)
     Patient  visit on 01/22/2022  at [facility] was for dehydratiom  Have you been able to follow up with your primary care physician?  [place]  The patient was  able to obtain any needed medicine or equipment.  Are there diet recommendations that you are having difficulty following?  Patient expresses understanding of discharge instructions and education provided has no other needs at this time.    [facility] [phone] [address] , [place] [address] Email : [email or web address]

## 2022-01-25 NOTE — Telephone Encounter (Signed)
     Patient  visit on 01/19/2022  at [facility]  ed was for Dehydration   Have you been able to follow up with your primary care physician? Yes   The patient was able to obtain any needed medicine or equipment.  Are there diet recommendations that you are having difficulty following?  Patient expresses understanding of discharge instructions and education provided has no other needs at this time.    [facility] [phone] [address] , [place] [address] Email : [email or web address]

## 2022-01-28 MED ORDER — DIVALPROEX SODIUM 125 MG PO CSDR: 125.0000 mg | DELAYED_RELEASE_CAPSULE | Freq: Two times a day (BID) | ORAL | 3 refills | Status: DC

## 2022-01-28 NOTE — Progress Notes (Unsigned)
Electrophysiology Office Note Date: 01/28/2022  ID:  [name], DOB May 12, 1924, MRN [id number]  PCP: [name], MD Primary Cardiologist: None Electrophysiologist: Cristopher Peru, MD   CC: Pacemaker follow-up  [name] is a 88 y.o. male seen today for Cristopher Peru, MD for routine electrophysiology followup.    Nearing ERI with <77moat last visit. Since last being seen in our clinic the patient reports doing OK.  he denies chest pain, palpitations, dyspnea, PND, orthopnea, nausea, vomiting, dizziness, syncope, edema, weight gain, or early satiety.   Device History: MD single chamber PPM implanted 09/26/2004, gen change 04/06/2012  Past Medical History:  Diagnosis Date   Arthritis    Atrial fibrillation ([facility]    CKD (chronic kidney disease)    History of hiatal hernia    Hx SBO    Last 2013 all treated conservatively   Hypertension    Presence of permanent cardiac pacemaker    Prostate cancer ([facility]    Stroke ([facility] 2017   left facial drooping noted 08/14/2016   Tachycardia-bradycardia syndrome ([facility]    Type II diabetes mellitus ([facility]    Ventral hernia    x3   Past Surgical History:  Procedure Laterality Date   ABDOMINAL AORTOGRAM W/LOWER EXTREMITY N/A 08/07/2016   Procedure: Abdominal Aortogram w/Lower Extremity;  Surgeon: MWellington Hampshire MD;  [facility];  Service: Cardiovascular;  Laterality: N/A;   CHOLECYSTECTOMY OPEN  03/31/2001   Dr ALindon Romp  COLON SURGERY     EXPLORATORY LAPAROTOMY  08/2004   /Archie Endo5/14/2012   HEMORRHOID SURGERY     HERNIA REPAIR     HIATAL HERNIA REPAIR  2002   INCISIONAL HERNIA REPAIR  08/2004   /Archie Endo5/14/2012   INSERT / REPLACE / REMOVE PACEMAKER     PERIPHERAL VASCULAR BALLOON ANGIOPLASTY  08/07/2016   Procedure: Peripheral Vascular Balloon Angioplasty;  Surgeon: MWellington Hampshire MD;  [facility];  Service: Cardiovascular;;  left popliteal artery   PERIPHERAL VASCULAR  INTERVENTION  08/14/2016   popliteal artery/notes 08/14/2016   PERIPHERAL VASCULAR INTERVENTION Left 08/14/2016   Procedure: Peripheral Vascular Intervention;  Surgeon: MWellington Hampshire MD;  [facility];  Service: Cardiovascular;  Laterality: Left;  popliteal artery   PERMANENT PACEMAKER GENERATOR CHANGE N/A 04/06/2012   Procedure: PERMANENT PACEMAKER GENERATOR CHANGE;  Surgeon: GEvans Lance MD;  [facility];  Service: Cardiovascular;  Laterality: N/A;   SMALL INTESTINE SURGERY  09/16/2004   SBO resection, VH repair    Current Outpatient Medications  Medication Sig Dispense Refill   atorvastatin (LIPITOR) 10 MG tablet Take 1 tablet by mouth at bedtime.     divalproex (DEPAKOTE SPRINKLES) 125 MG capsule Take 1 capsule (125 mg total) by mouth 2 (two) times daily. 60 capsule 3   docusate sodium (COLACE) 100 MG capsule Take 1 capsule (100 mg total) by mouth every other day.     ergocalciferol (VITAMIN D2) 1.25 MG (50000 UT) capsule Take 50,000 Units by mouth once a week.     ferrous sulfate (FEROSUL) 325 (65 FE) MG tablet Take 1 tablet (325 mg total) by mouth every other day. 90 tablet 2   finasteride (PROSCAR) 5 MG tablet Take 1 tablet (5 mg total) by mouth daily. 90 tablet 1   folic acid (FOLVITE) 1 MG tablet Take 1 tablet (1 mg total) by mouth daily. 90 tablet 3   furosemide (LASIX) 20 MG tablet Take 1  tablet (20 mg total) by mouth daily. 90 tablet 1   JARDIANCE 25 MG TABS tablet Take by mouth.     ketoconazole (NIZORAL) 2 % cream Apply topically.     ketoconazole 2%-triamcinolone 0.1% 1:2 cream mixture Apply topically daily. Apply to foreskin daily for 10 days. 45 g 1   latanoprost (XALATAN) 0.005 % ophthalmic solution Place 1 drop into both eyes at bedtime.     memantine (NAMENDA) 10 MG tablet Take 1 tablet (10 mg total) by mouth 2 (two) times daily. 180 tablet 3   metoprolol succinate (TOPROL-XL) 25 MG 24 hr tablet Take 1 tablet (25 mg total) by mouth daily. 30  tablet 0   Nutritional Supplements (GLUCERNA ADVANCE SHAKE PO) Take 1 Bottle by mouth daily as needed (supplement).     pantoprazole (PROTONIX) 40 MG tablet TAKE 1 TABLET(40 MG) BY MOUTH DAILY 90 tablet 1   polyethylene glycol powder (GLYCOLAX/MIRALAX) 17 GM/SCOOP powder Take 17 g by mouth 2 (two) times daily as needed. 3350 g 1   QUEtiapine (SEROQUEL) 25 MG tablet Take 1 tablet (25 mg total) by mouth at bedtime. 30 tablet 2   tamsulosin (FLOMAX) 0.4 MG CAPS capsule Take 1 capsule (0.4 mg total) by mouth daily. 90 capsule 3   vitamin B-12 (CYANOCOBALAMIN) 500 MCG tablet Take 500 mcg by mouth daily.     XARELTO 15 MG TABS tablet TAKE 1 TABLET DAILY WITH SUPPER 90 tablet 1   Current Facility-Administered Medications  Medication Dose Route Frequency Provider Last Rate Last Admin   divalproex (DEPAKOTE SPRINKLE) capsule 250 mg  250 mg Oral Q12H Garvin Fila, MD        Allergies:   Patient has no known allergies.   Social History: Social History   Socioeconomic History   Marital status: Widowed    Spouse name: Not on file   Number of children: Not on file   Years of education: Not on file   Highest education level: Not on file  Occupational History   Not on file  Tobacco Use   Smoking status: Former    Packs/day: 1.50    Years: 15.00    Total pack years: 22.50    Types: Cigarettes    Quit date: 04/16/1955    Years since quitting: 66.8    Passive exposure: Past   Smokeless tobacco: Former    Types: Chew    Quit date: 08/17/1952  Vaping Use   Vaping Use: Never used  Substance and Sexual Activity   Alcohol use: No    Comment: 08/14/2016 "drank beer when I was a teenager"   Drug use: No   Sexual activity: Yes  Other Topics Concern   Not on file  Social History Narrative   Lives alone   Social Determinants of Health   Financial Resource Strain: Not on file  Food Insecurity: Not on file  Transportation Needs: Not on file  Physical Activity: Not on file  Stress: Not on  file  Social Connections: Not on file  Intimate Partner Violence: Not on file    Family History: Family History  Problem Relation Age of Onset   Pneumonia Father    Stroke Brother    Stroke Sister    Cancer Brother        type unknown     Review of Systems: All other systems reviewed and are otherwise negative except as noted above.  Physical Exam: Vitals:   01/29/22 1042  BP: 108/60  Pulse: 93  SpO2:  99%  Weight: 138 lb 6.4 oz (62.8 kg)  Height: '5\' 11"'$  (1.803 m)     GEN- The patient is elderly appearing, alert and oriented x 3 today.   HEENT: normocephalic, atraumatic; sclera clear, conjunctiva pink; hearing intact; oropharynx clear; neck supple, no JVP Lymph- no cervical lymphadenopathy Lungs- Clear to ausculation bilaterally, normal work of breathing.  No wheezes, rales, rhonchi Heart- Irregularly irregular  rate and rhythm, no murmurs, rubs or gallops, PMI not laterally displaced GI- soft, non-tender, non-distended, bowel sounds present, no hepatosplenomegaly Extremities- no clubbing or cyanosis. No peripheral edema; DP/PT/radial pulses 2+ bilaterally MS- no significant deformity or atrophy Skin- warm and dry, no rash or lesion; PPM pocket well healed Psych- euthymic mood, full affect Neuro- strength and sensation are intact  PPM Interrogation-  reviewed in detail today,  See PACEART report.  EKG:  EKG is not ordered today.  Recent Labs: 12/05/2021: B Natriuretic Peptide 531.7 12/31/2021: Magnesium 2.0; TSH 2.092 01/19/2022: ALT 13; BUN 31; Creatinine, Ser 2.37; Hemoglobin 9.1; Platelets 152; Potassium 4.3; Sodium 143   Wt Readings from Last 3 Encounters:  01/15/22 136 lb 9.6 oz (62 kg)  01/08/22 136 lb 12.8 oz (62.1 kg)  12/11/21 154 lb (69.9 kg)     Other studies Reviewed: Additional studies/ records that were reviewed today include: Previous EP office notes, Previous remote checks, Most recent labwork.   Assessment and Plan:  SSS/Tachy-brady s/p  [employer] single chamber  Device at ERI as of 01/23/2022 Pt is not dependent, but would prefer to proceed with gen change. Explained risks, benefits, and alternatives to generator change, including but not limited to bleeding and infection. Pt verbalized understanding and agrees to proceed.   Permanent AFib CHA2DS2Vasc is 7, on Xarelto appropriately dosed Hopefully as his H/H improves so will his HR   PVD Denies claudication  Following with Dr. Fletcher Anon   4. HTN Stable on current regimen    Current medicines are reviewed at length with the patient today.    Labs/ tests ordered today include:  No orders of the defined types were placed in this encounter.   Disposition:   Follow up with Dr. Lovena Le in as usual post gen change    Signed, Shirley Friar, PA-C  01/28/2022 1:06 PM  [facility] [address] [place] [address] [phone] (office) [phone] (fax)

## 2022-01-29 ENCOUNTER — Encounter: Payer: Self-pay | Admitting: Student

## 2022-01-29 ENCOUNTER — Encounter: Payer: Self-pay | Admitting: Family Medicine

## 2022-01-29 ENCOUNTER — Ambulatory Visit: Payer: Medicare PPO | Attending: Student | Admitting: Student

## 2022-01-29 VITALS — BP 108/60 | HR 93 | Ht 71.0 in | Wt 138.4 lb

## 2022-01-29 DIAGNOSIS — Z79899 Other long term (current) drug therapy: Secondary | ICD-10-CM | POA: Diagnosis not present

## 2022-01-29 DIAGNOSIS — I5032 Chronic diastolic (congestive) heart failure: Secondary | ICD-10-CM | POA: Diagnosis not present

## 2022-01-29 DIAGNOSIS — I4821 Permanent atrial fibrillation: Secondary | ICD-10-CM

## 2022-01-29 DIAGNOSIS — Z950 Presence of cardiac pacemaker: Secondary | ICD-10-CM

## 2022-01-29 DIAGNOSIS — I10 Essential (primary) hypertension: Secondary | ICD-10-CM

## 2022-01-29 NOTE — Patient Instructions (Signed)
Medication Instructions:  Your physician recommends that you continue on your current medications as directed. Please refer to the Current Medication list given to you today.  *If you need a refill on your cardiac medications before your next appointment, please call your pharmacy*   Lab Work: TODAY: BMET, CBC w/diff  If you have labs (blood work) drawn today and your tests are completely normal, you will receive your results only by: [place] (if you have MyChart) OR A paper copy in the mail If you have any lab test that is abnormal or we need to change your treatment, we will call you to review the results.   Follow-Up: At [facility], you and your health needs are our priority.  As part of our continuing mission to provide you with exceptional heart care, we have created designated Provider Care Teams.  These Care Teams include your primary Cardiologist (physician) and Advanced Practice Providers (APPs -  Physician Assistants and Nurse Practitioners) who all work together to provide you with the care you need, when you need it.   Your next appointment:   We will call you to schedule  Other Instructions: See letter for Pacemaker Generator Change Instructions

## 2022-01-30 ENCOUNTER — Encounter: Payer: Self-pay | Admitting: Family Medicine

## 2022-01-30 DIAGNOSIS — E559 Vitamin D deficiency, unspecified: Secondary | ICD-10-CM

## 2022-01-30 LAB — BASIC METABOLIC PANEL
BUN/Creatinine Ratio: 14 (ref 10–24)
BUN: 28 mg/dL (ref 10–36)
CO2: 20 mmol/L (ref 20–29)
Calcium: 8.8 mg/dL (ref 8.6–10.2)
Chloride: 107 mmol/L — ABNORMAL HIGH (ref 96–106)
Creatinine, Ser: 2.06 mg/dL — ABNORMAL HIGH (ref 0.76–1.27)
Glucose: 127 mg/dL — ABNORMAL HIGH (ref 70–99)
Potassium: 4.9 mmol/L (ref 3.5–5.2)
Sodium: 144 mmol/L (ref 134–144)
eGFR: 29 mL/min/{1.73_m2} — ABNORMAL LOW (ref >59)

## 2022-01-30 LAB — CBC WITH DIFFERENTIAL/PLATELET
Basophils Absolute: 0.1 10*3/uL (ref 0.0–0.2)
Basos: 1 %
EOS (ABSOLUTE): 0.1 10*3/uL (ref 0.0–0.4)
Eos: 1 %
Hematocrit: 30.1 % — ABNORMAL LOW (ref 37.5–51.0)
Hemoglobin: 9.4 g/dL — ABNORMAL LOW (ref 13.0–17.7)
Immature Grans (Abs): 0 10*3/uL (ref 0.0–0.1)
Immature Granulocytes: 0 %
Lymphocytes Absolute: 1.5 10*3/uL (ref 0.7–3.1)
Lymphs: 21 %
MCH: 25.3 pg — ABNORMAL LOW (ref 26.6–33.0)
MCHC: 31.2 g/dL — ABNORMAL LOW (ref 31.5–35.7)
MCV: 81 fL (ref 79–97)
Monocytes Absolute: 0.7 10*3/uL (ref 0.1–0.9)
Monocytes: 9 %
Neutrophils Absolute: 4.8 10*3/uL (ref 1.4–7.0)
Neutrophils: 68 %
Platelets: 185 10*3/uL (ref 150–450)
RBC: 3.71 x10E6/uL — ABNORMAL LOW (ref 4.14–5.80)
RDW: 15.4 % (ref 11.6–15.4)
WBC: 7.1 10*3/uL (ref 3.4–10.8)

## 2022-01-30 LAB — CUP PACEART INCLINIC DEVICE CHECK
Date Time Interrogation Session: 20230913080252
Implantable Lead Implant Date: 20060510
Implantable Lead Location: 753860
Implantable Lead Model: 5076
Implantable Pulse Generator Implant Date: 20131118

## 2022-01-30 MED ORDER — ERGOCALCIFEROL 1.25 MG (50000 UT) PO CAPS: 50000.0000 [IU] | ORAL_CAPSULE | ORAL | 2 refills | Status: DC

## 2022-02-01 NOTE — Pre-Procedure Instructions (Signed)
Attempted to call patient regarding procedure instructions.  No answer 

## 2022-02-03 ENCOUNTER — Encounter: Payer: Self-pay | Admitting: Neurology

## 2022-02-04 ENCOUNTER — Other Ambulatory Visit: Payer: Self-pay

## 2022-02-04 ENCOUNTER — Encounter (HOSPITAL_COMMUNITY)
Admission: RE | Disposition: A | Discharge status: Home / Self Care | Payer: Self-pay | Source: Home / Self Care | Attending: Internal Medicine

## 2022-02-04 ENCOUNTER — Telehealth: Payer: Self-pay | Admitting: Home Health

## 2022-02-04 ENCOUNTER — Ambulatory Visit (HOSPITAL_COMMUNITY)
Admission: RE | Admit: 2022-02-04 | Discharge: 2022-02-04 | Disposition: A | Discharge status: Home / Self Care | Payer: Medicare PPO | Attending: Internal Medicine | Admitting: Internal Medicine

## 2022-02-04 DIAGNOSIS — Z87891 Personal history of nicotine dependence: Secondary | ICD-10-CM | POA: Insufficient documentation

## 2022-02-04 DIAGNOSIS — I10 Essential (primary) hypertension: Secondary | ICD-10-CM | POA: Diagnosis not present

## 2022-02-04 DIAGNOSIS — I4821 Permanent atrial fibrillation: Secondary | ICD-10-CM | POA: Diagnosis not present

## 2022-02-04 DIAGNOSIS — I739 Peripheral vascular disease, unspecified: Secondary | ICD-10-CM | POA: Diagnosis not present

## 2022-02-04 DIAGNOSIS — Z45010 Encounter for checking and testing of cardiac pacemaker pulse generator [battery]: Secondary | ICD-10-CM | POA: Diagnosis not present

## 2022-02-04 DIAGNOSIS — Z7901 Long term (current) use of anticoagulants: Secondary | ICD-10-CM | POA: Insufficient documentation

## 2022-02-04 DIAGNOSIS — I495 Sick sinus syndrome: Secondary | ICD-10-CM

## 2022-02-04 HISTORY — PX: PPM GENERATOR CHANGEOUT: EP1233

## 2022-02-04 SURGERY — PPM GENERATOR CHANGEOUT

## 2022-02-04 MED ORDER — LIDOCAINE HCL (PF) 1 % IJ SOLN
INTRAMUSCULAR | Status: DC | PRN
  Administered 2022-02-04: 60 mL

## 2022-02-04 MED ORDER — ACETAMINOPHEN 325 MG PO TABS
325.0000 mg | ORAL_TABLET | ORAL | Status: DC | PRN
  Administered 2022-02-04: 650 mg via ORAL
  Filled 2022-02-04: qty 2

## 2022-02-04 MED ORDER — CHLORHEXIDINE GLUCONATE 4 % EX LIQD: 4.0000 | Freq: Once | CUTANEOUS | Status: DC

## 2022-02-04 MED ORDER — CEFAZOLIN SODIUM-DEXTROSE 2-4 GM/100ML-% IV SOLN
INTRAVENOUS | Status: AC
  Filled 2022-02-04: qty 100

## 2022-02-04 MED ORDER — CEFAZOLIN SODIUM-DEXTROSE 2-4 GM/100ML-% IV SOLN
2.0000 g | INTRAVENOUS | Status: AC
  Administered 2022-02-04: 2 g via INTRAVENOUS

## 2022-02-04 MED ORDER — POVIDONE-IODINE 10 % EX SWAB: 2.0000 | Freq: Once | CUTANEOUS | Status: DC

## 2022-02-04 MED ORDER — ONDANSETRON HCL 4 MG/2ML IJ SOLN: 4.0000 mg | Freq: Four times a day (QID) | INTRAMUSCULAR | Status: DC | PRN

## 2022-02-04 MED ORDER — SODIUM CHLORIDE 0.9 % IV SOLN
80.0000 mg | INTRAVENOUS | Status: AC
  Administered 2022-02-04: 80 mg

## 2022-02-04 MED ORDER — SODIUM CHLORIDE 0.9 % IV SOLN
INTRAVENOUS | Status: DC
Start: 2022-02-04 — End: 2022-02-04

## 2022-02-04 MED ORDER — LIDOCAINE HCL (PF) 1 % IJ SOLN
INTRAMUSCULAR | Status: AC
  Filled 2022-02-04: qty 60

## 2022-02-04 MED ORDER — SODIUM CHLORIDE 0.9 % IV SOLN
INTRAVENOUS | Status: AC
  Filled 2022-02-04: qty 2

## 2022-02-04 SURGICAL SUPPLY — 7 items
CABLE SURGICAL S-101-97-12 (CABLE) ×1 IMPLANT
IPG PACE AZUR XT SR MRI W1SR01 (Pacemaker) IMPLANT
PACE AZURE XT SR MRI W1SR01 (Pacemaker) ×1 IMPLANT
PAD DEFIB RADIO PHYSIO CONN (PAD) ×1 IMPLANT
POUCH AIGIS-R ANTIBACT PPM (Mesh General) ×1 IMPLANT
POUCH AIGIS-R ANTIBACT PPM MED (Mesh General) IMPLANT
TRAY PACEMAKER INSERTION (PACKS) ×1 IMPLANT

## 2022-02-04 NOTE — Telephone Encounter (Signed)
Patient daughter called, states patient did not receive prescription for tylenol from EP today and is sore. Advised patient to take Tylenol '500mg'$  2 tablet up to 3 times daily for pain as needed, do not >3g daily, can purchase OTC tylenol.

## 2022-02-04 NOTE — H&P (Signed)
Electrophysiology Office Note Date: 01/28/2022   ID:  [name], DOB 1923-10-01, MRN [id number]   PCP: Matthew Maw, MD Primary Cardiologist: None Electrophysiologist: Matthew Peru, MD    CC: Pacemaker follow-up   [name] is a 88 y.o. male seen today for Matthew Peru, MD for routine electrophysiology followup.     Nearing ERI with <58moat last visit. Since last being seen in our clinic the patient reports doing OK.  he denies chest pain, palpitations, dyspnea, PND, orthopnea, nausea, vomiting, dizziness, syncope, edema, weight gain, or early satiety.    Device History: MD single chamber PPM implanted 09/26/2004, gen change 04/06/2012       Past Medical History:  Diagnosis Date   Arthritis     Atrial fibrillation ([facility]     CKD (chronic kidney disease)     History of hiatal hernia     Hx SBO      Last 2013 all treated conservatively   Hypertension     Presence of permanent cardiac pacemaker     Pro[place]     [place]    left facial drooping noted 08/14/2016   Tachycardia-bradycardia syndrome ([facility]     Type II diabetes mellitus ([facility]     Ventral hernia      x3         Past Surgical History:  Procedure Laterality Date   ABDOMINAL AORTOGRAM W/LOWER EXTREMITY N/A 08/07/2016    Procedure: Abdominal Aortogram w/Lower Extremity;  Surgeon: MWellington Hampshire MD;  [facility];  Service: Cardiovascular;  Laterality: N/A;   CHOLECYSTECTOMY OPEN   03/31/2001    Dr ALindon Romp  COLON SURGERY       EXPLORATORY LAPAROTOMY   08/2004    /Matthew Endo5/14/2012   HEMORRHOID SURGERY       HERNIA REPAIR       HIATAL HERNIA REPAIR   2002   INCISIONAL HERNIA REPAIR   08/2004    /Matthew Endo5/14/2012   INSERT / REPLACE / REMOVE PACEMAKER       PERIPHERAL VASCULAR BALLOON ANGIOPLASTY   08/07/2016    Procedure: Peripheral Vascular Balloon Angioplasty;  Surgeon: MWellington Hampshire MD;  [facility];  Service: Cardiovascular;;   left popliteal artery   PERIPHERAL VASCULAR INTERVENTION   08/14/2016    popliteal artery/notes 08/14/2016   PERIPHERAL VASCULAR INTERVENTION Left 08/14/2016    Procedure: Peripheral Vascular Intervention;  Surgeon: MWellington Hampshire MD;  [facility];  Service: Cardiovascular;  Laterality: Left;  popliteal artery   PERMANENT PACEMAKER GENERATOR CHANGE N/A 04/06/2012    Procedure: PERMANENT PACEMAKER GENERATOR CHANGE;  Surgeon: GEvans Lance MD;  [facility];  Service: Cardiovascular;  Laterality: N/A;   SMALL INTESTINE SURGERY   09/16/2004    SBO resection, VH repair            Current Outpatient Medications  Medication Sig Dispense Refill   atorvastatin (LIPITOR) 10 MG tablet Take 1 tablet by mouth at bedtime.       divalproex (DEPAKOTE SPRINKLES) 125 MG capsule Take 1 capsule (125 mg total) by mouth 2 (two) times daily. 60 capsule 3   docusate sodium (COLACE) 100 MG capsule Take 1 capsule (100 mg total) by mouth every other day.       ergocalciferol (VITAMIN D2) 1.25 MG (50000 UT) capsule Take 50,000 Units by mouth once a week.  ferrous sulfate (FEROSUL) 325 (65 FE) MG tablet Take 1 tablet (325 mg total) by mouth every other day. 90 tablet 2   finasteride (PROSCAR) 5 MG tablet Take 1 tablet (5 mg total) by mouth daily. 90 tablet 1   folic acid (FOLVITE) 1 MG tablet Take 1 tablet (1 mg total) by mouth daily. 90 tablet 3   furosemide (LASIX) 20 MG tablet Take 1 tablet (20 mg total) by mouth daily. 90 tablet 1   JARDIANCE 25 MG TABS tablet Take by mouth.       ketoconazole (NIZORAL) 2 % cream Apply topically.       ketoconazole 2%-triamcinolone 0.1% 1:2 cream mixture Apply topically daily. Apply to foreskin daily for 10 days. 45 g 1   latanoprost (XALATAN) 0.005 % ophthalmic solution Place 1 drop into both eyes at bedtime.       memantine (NAMENDA) 10 MG tablet Take 1 tablet (10 mg total) by mouth 2 (two) times daily. 180 tablet 3   metoprolol succinate  (TOPROL-XL) 25 MG 24 hr tablet Take 1 tablet (25 mg total) by mouth daily. 30 tablet 0   Nutritional Supplements (GLUCERNA ADVANCE SHAKE PO) Take 1 Bottle by mouth daily as needed (supplement).       pantoprazole (PROTONIX) 40 MG tablet TAKE 1 TABLET(40 MG) BY MOUTH DAILY 90 tablet 1   polyethylene glycol powder (GLYCOLAX/MIRALAX) 17 GM/SCOOP powder Take 17 g by mouth 2 (two) times daily as needed. 3350 g 1   QUEtiapine (SEROQUEL) 25 MG tablet Take 1 tablet (25 mg total) by mouth at bedtime. 30 tablet 2   tamsulosin (FLOMAX) 0.4 MG CAPS capsule Take 1 capsule (0.4 mg total) by mouth daily. 90 capsule 3   vitamin B-12 (CYANOCOBALAMIN) 500 MCG tablet Take 500 mcg by mouth daily.       XARELTO 15 MG TABS tablet TAKE 1 TABLET DAILY WITH SUPPER 90 tablet 1             Current Facility-Administered Medications  Medication Dose Route Frequency Provider Last Rate Last Admin   divalproex (DEPAKOTE SPRINKLE) capsule 250 mg  250 mg Oral Q12H Garvin Fila, MD          Allergies:   Patient has no known allergies.    Social History: Social History         Socioeconomic History   Marital status: Widowed      Spouse name: Not on file   Number of children: Not on file   Years of education: Not on file   Highest education level: Not on file  Occupational History   Not on file  Tobacco Use   Smoking status: Former      Packs/day: 1.50      Years: 15.00      Total pack years: 22.50      Types: Cigarettes      Quit date: 04/16/1955      Years since quitting: 66.8      Passive exposure: Past   Smokeless tobacco: Former      Types: Chew      Quit date: 08/17/1952  Vaping Use   Vaping Use: Never used  Substance and Sexual Activity   Alcohol use: No      Comment: 08/14/2016 "drank beer when I was a teenager"   Drug use: No   Sexual activity: Yes  Other Topics Concern   Not on file  Social History Narrative    Lives alone    Social Determinants  of Health    Financial Resource Strain:  Not on file  Food Insecurity: Not on file  Transportation Needs: Not on file  Physical Activity: Not on file  Stress: Not on file  Social Connections: Not on file  Intimate Partner Violence: Not on file      Family History:      Family History  Problem Relation Age of Onset   Pneumonia Father     Stroke Brother     Stroke Sister     Cancer Brother          type unknown        Review of Systems: All other systems reviewed and are otherwise negative except as noted above.   Physical Exam:    Vitals:    01/29/22 1042  BP: 108/60  Pulse: 93  SpO2: 99%  Weight: 138 lb 6.4 oz (62.8 kg)  Height: '5\' 11"'$  (1.803 m)      GEN- The patient is elderly appearing, alert and oriented x 3 today.   HEENT: normocephalic, atraumatic; sclera clear, conjunctiva pink; hearing intact; oropharynx clear; neck supple, no JVP Lymph- no cervical lymphadenopathy Lungs- Clear to ausculation bilaterally, normal work of breathing.  No wheezes, rales, rhonchi Heart- Irregularly irregular  rate and rhythm, no murmurs, rubs or gallops, PMI not laterally displaced GI- soft, non-tender, non-distended, bowel sounds present, no hepatosplenomegaly Extremities- no clubbing or cyanosis. No peripheral edema; DP/PT/radial pulses 2+ bilaterally MS- no significant deformity or atrophy Skin- warm and dry, no rash or lesion; PPM pocket well healed Psych- euthymic mood, full affect Neuro- strength and sensation are intact   PPM Interrogation-  reviewed in detail today,  See PACEART report.   EKG:  EKG is not ordered today.   Recent Labs: 12/05/2021: B Natriuretic Peptide 531.7 12/31/2021: Magnesium 2.0; TSH 2.092 01/19/2022: ALT 13; BUN 31; Creatinine, Ser 2.37; Hemoglobin 9.1; Platelets 152; Potassium 4.3; Sodium 143       Wt Readings from Last 3 Encounters:  01/15/22 136 lb 9.6 oz (62 kg)  01/08/22 136 lb 12.8 oz (62.1 kg)  12/11/21 154 lb (69.9 kg)      Other studies Reviewed: Additional studies/ records  that were reviewed today include: Previous EP office notes, Previous remote checks, Most recent labwork.    Assessment and Plan:   SSS/Tachy-brady s/p Medtronic single chamber  Device at ERI as of 01/23/2022 Pt is not dependent, but would prefer to proceed with gen change. Explained risks, benefits, and alternatives to generator change, including but not limited to bleeding and infection. Pt verbalized understanding and agrees to proceed.    Permanent AFib CHA2DS2Vasc is 7, on Xarelto appropriately dosed Hopefully as his H/H improves so will his HR   PVD Denies claudication  Following with Dr. Fletcher Anon   4. HTN Stable on current regimen      Current medicines are reviewed at length with the patient today.     Labs/ tests ordered today include:  No orders of the defined types were placed in this encounter.    Disposition:   Follow up with Dr. Lovena Le in as usual post gen change      Signed, Annamaria Helling  01/28/2022 1:06 PM  EP Attending  Patient seen and examined. Agree with above. The patient has reached ERI. He will undergo PM gen change out. He is quite thin and we will place his new PPM under his pectoralis major.  Carleene Overlie Diannah Rindfleisch,MD

## 2022-02-04 NOTE — Discharge Instructions (Signed)

## 2022-02-05 ENCOUNTER — Emergency Department (HOSPITAL_COMMUNITY)
Admission: EM | Admit: 2022-02-05 | Discharge: 2022-02-05 | Disposition: A | Discharge status: Home / Self Care | Payer: Medicare PPO | Attending: Emergency Medicine | Admitting: Emergency Medicine

## 2022-02-05 ENCOUNTER — Encounter (HOSPITAL_COMMUNITY): Payer: Self-pay | Admitting: Internal Medicine

## 2022-02-05 ENCOUNTER — Emergency Department (HOSPITAL_COMMUNITY): Payer: Medicare PPO

## 2022-02-05 DIAGNOSIS — Z950 Presence of cardiac pacemaker: Secondary | ICD-10-CM | POA: Diagnosis not present

## 2022-02-05 DIAGNOSIS — R519 Headache, unspecified: Secondary | ICD-10-CM | POA: Diagnosis not present

## 2022-02-05 DIAGNOSIS — S199XXA Unspecified injury of neck, initial encounter: Secondary | ICD-10-CM | POA: Diagnosis not present

## 2022-02-05 DIAGNOSIS — W1830XA Fall on same level, unspecified, initial encounter: Secondary | ICD-10-CM | POA: Insufficient documentation

## 2022-02-05 DIAGNOSIS — G319 Degenerative disease of nervous system, unspecified: Secondary | ICD-10-CM | POA: Diagnosis not present

## 2022-02-05 DIAGNOSIS — S0990XA Unspecified injury of head, initial encounter: Secondary | ICD-10-CM

## 2022-02-05 DIAGNOSIS — Y92000 Kitchen of unspecified non-institutional (private) residence as  the place of occurrence of the external cause: Secondary | ICD-10-CM | POA: Diagnosis not present

## 2022-02-05 DIAGNOSIS — I129 Hypertensive chronic kidney disease with stage 1 through stage 4 chronic kidney disease, or unspecified chronic kidney disease: Secondary | ICD-10-CM | POA: Diagnosis not present

## 2022-02-05 DIAGNOSIS — Z8546 Personal history of malignant neoplasm of prostate: Secondary | ICD-10-CM | POA: Insufficient documentation

## 2022-02-05 DIAGNOSIS — E1122 Type 2 diabetes mellitus with diabetic chronic kidney disease: Secondary | ICD-10-CM | POA: Diagnosis not present

## 2022-02-05 DIAGNOSIS — J9811 Atelectasis: Secondary | ICD-10-CM | POA: Diagnosis not present

## 2022-02-05 DIAGNOSIS — I959 Hypotension, unspecified: Secondary | ICD-10-CM | POA: Diagnosis not present

## 2022-02-05 DIAGNOSIS — W19XXXA Unspecified fall, initial encounter: Secondary | ICD-10-CM | POA: Diagnosis not present

## 2022-02-05 DIAGNOSIS — I739 Peripheral vascular disease, unspecified: Secondary | ICD-10-CM | POA: Diagnosis not present

## 2022-02-05 DIAGNOSIS — S0181XA Laceration without foreign body of other part of head, initial encounter: Secondary | ICD-10-CM | POA: Diagnosis not present

## 2022-02-05 DIAGNOSIS — J439 Emphysema, unspecified: Secondary | ICD-10-CM | POA: Diagnosis not present

## 2022-02-05 DIAGNOSIS — Z7901 Long term (current) use of anticoagulants: Secondary | ICD-10-CM | POA: Diagnosis not present

## 2022-02-05 DIAGNOSIS — N189 Chronic kidney disease, unspecified: Secondary | ICD-10-CM | POA: Insufficient documentation

## 2022-02-05 DIAGNOSIS — Z79899 Other long term (current) drug therapy: Secondary | ICD-10-CM | POA: Insufficient documentation

## 2022-02-05 DIAGNOSIS — S3993XA Unspecified injury of pelvis, initial encounter: Secondary | ICD-10-CM | POA: Diagnosis not present

## 2022-02-05 DIAGNOSIS — S299XXA Unspecified injury of thorax, initial encounter: Secondary | ICD-10-CM | POA: Diagnosis not present

## 2022-02-05 DIAGNOSIS — S0083XA Contusion of other part of head, initial encounter: Secondary | ICD-10-CM

## 2022-02-05 DIAGNOSIS — I10 Essential (primary) hypertension: Secondary | ICD-10-CM | POA: Diagnosis not present

## 2022-02-05 DIAGNOSIS — S01419A Laceration without foreign body of unspecified cheek and temporomandibular area, initial encounter: Secondary | ICD-10-CM | POA: Diagnosis not present

## 2022-02-05 LAB — URINALYSIS, ROUTINE W REFLEX MICROSCOPIC
Bacteria, UA: NONE SEEN
Bilirubin Urine: NEGATIVE
Glucose, UA: >=500 mg/dL — AB
Hgb urine dipstick: NEGATIVE
Ketones, ur: NEGATIVE mg/dL
Leukocytes,Ua: NEGATIVE
Nitrite: NEGATIVE
Protein, ur: NEGATIVE mg/dL
Specific Gravity, Urine: 1.014 (ref 1.005–1.030)
pH: 5 (ref 5.0–8.0)

## 2022-02-05 LAB — I-STAT CHEM 8, ED
BUN: 39 mg/dL — ABNORMAL HIGH (ref 8–23)
Calcium, Ion: 0.95 mmol/L — ABNORMAL LOW (ref 1.15–1.40)
Chloride: 111 mmol/L (ref 98–111)
Creatinine, Ser: 2.3 mg/dL — ABNORMAL HIGH (ref 0.61–1.24)
Glucose, Bld: 137 mg/dL — ABNORMAL HIGH (ref 70–99)
HCT: 30 % — ABNORMAL LOW (ref 39.0–52.0)
Hemoglobin: 10.2 g/dL — ABNORMAL LOW (ref 13.0–17.0)
Potassium: 4.4 mmol/L (ref 3.5–5.1)
Sodium: 145 mmol/L (ref 135–145)
TCO2: 24 mmol/L (ref 22–32)

## 2022-02-05 LAB — COMPREHENSIVE METABOLIC PANEL
ALT: 10 U/L (ref 0–44)
AST: 21 U/L (ref 15–41)
Albumin: 3 g/dL — ABNORMAL LOW (ref 3.5–5.0)
Alkaline Phosphatase: 55 U/L (ref 38–126)
Anion gap: 11 (ref 5–15)
BUN: 31 mg/dL — ABNORMAL HIGH (ref 8–23)
CO2: 22 mmol/L (ref 22–32)
Calcium: 8 mg/dL — ABNORMAL LOW (ref 8.9–10.3)
Chloride: 112 mmol/L — ABNORMAL HIGH (ref 98–111)
Creatinine, Ser: 2.26 mg/dL — ABNORMAL HIGH (ref 0.61–1.24)
GFR, Estimated: 26 mL/min — ABNORMAL LOW (ref >60)
Glucose, Bld: 157 mg/dL — ABNORMAL HIGH (ref 70–99)
Potassium: 4.5 mmol/L (ref 3.5–5.1)
Sodium: 145 mmol/L (ref 135–145)
Total Bilirubin: 0.5 mg/dL (ref 0.3–1.2)
Total Protein: 5.6 g/dL — ABNORMAL LOW (ref 6.5–8.1)

## 2022-02-05 LAB — CK: Total CK: 103 U/L (ref 49–397)

## 2022-02-05 LAB — CBC
HCT: 29.5 % — ABNORMAL LOW (ref 39.0–52.0)
Hemoglobin: 9.6 g/dL — ABNORMAL LOW (ref 13.0–17.0)
MCH: 25.9 pg — ABNORMAL LOW (ref 26.0–34.0)
MCHC: 32.5 g/dL (ref 30.0–36.0)
MCV: 79.7 fL — ABNORMAL LOW (ref 80.0–100.0)
Platelets: 147 10*3/uL — ABNORMAL LOW (ref 150–400)
RBC: 3.7 MIL/uL — ABNORMAL LOW (ref 4.22–5.81)
RDW: 16.1 % — ABNORMAL HIGH (ref 11.5–15.5)
WBC: 10.5 10*3/uL (ref 4.0–10.5)
nRBC: 0 % (ref 0.0–0.2)

## 2022-02-05 LAB — SAMPLE TO BLOOD BANK

## 2022-02-05 LAB — PROTIME-INR
INR: 1.3 — ABNORMAL HIGH (ref 0.8–1.2)
Prothrombin Time: 16.1 seconds — ABNORMAL HIGH (ref 11.4–15.2)

## 2022-02-05 LAB — TROPONIN I (HIGH SENSITIVITY)
Troponin I (High Sensitivity): 27 ng/L — ABNORMAL HIGH (ref <18)
Troponin I (High Sensitivity): 31 ng/L — ABNORMAL HIGH (ref <18)

## 2022-02-05 LAB — ETHANOL: Alcohol, Ethyl (B): <10 mg/dL (ref <10)

## 2022-02-05 LAB — APTT: aPTT: 29 seconds (ref 24–36)

## 2022-02-05 LAB — LACTIC ACID, PLASMA: Lactic Acid, Venous: 1.9 mmol/L (ref 0.5–1.9)

## 2022-02-05 MED ORDER — LIDOCAINE-EPINEPHRINE (PF) 2 %-1:200000 IJ SOLN
10.0000 mL | Freq: Once | INTRAMUSCULAR | Status: AC
  Administered 2022-02-05: 10 mL via INTRADERMAL
  Filled 2022-02-05: qty 20

## 2022-02-05 MED ORDER — LACTATED RINGERS IV BOLUS
500.0000 mL | Freq: Once | INTRAVENOUS | Status: AC
  Administered 2022-02-05: 500 mL via INTRAVENOUS

## 2022-02-05 NOTE — Progress Notes (Signed)
Orthopedic Tech Progress Note Patient Details:  [name] 09-05-1923 [id number]  Level 2 trauma, ortho not needed as of now.  Patient ID: [name], male   DOB: August 20, 1923, 88 y.o.   MRN: [id number]  Arville Go 02/05/2022, 9:21 AM

## 2022-02-05 NOTE — ED Notes (Signed)
Pt ambulated with walker in hallway well

## 2022-02-05 NOTE — ED Notes (Signed)
Lab called to add troponin on

## 2022-02-05 NOTE — ED Notes (Signed)
Trauma Response Nurse Documentation   [name] is a 88 y.o. male arriving to [facility] ED via EMS  On Xarelto (rivaroxaban) daily. Trauma was activated as a Level 2 by ED Charge RN based on the following trauma criteria Elderly patients > 65 with head trauma on anti-coagulation (excluding ASA). Trauma team at the bedside on patient arrival.   Patient cleared for CT by Dr. Philip Aspen. Pt transported to CT with trauma response nurse present to monitor. RN remained with the patient throughout their absence from the department for clinical observation.   GCS 14-15.  History   Past Medical History:  Diagnosis Date   Arthritis    Atrial fibrillation ([place])    CKD (chronic kidney disease)    History of hiatal hernia    Hx SBO    Last 2013 all treated conservatively   Hypertension    Presence of permanent cardiac pacemaker    Prostate cancer [facility])    Stroke ([place]) 2017   left facial drooping noted 08/14/2016   Tachycardia-bradycardia syndrome ([place])    Type II diabetes mellitus ([place])    Ventral hernia    x3     Past Surgical History:  Procedure Laterality Date   ABDOMINAL AORTOGRAM W/LOWER EXTREMITY N/A 08/07/2016   Procedure: Abdominal Aortogram w/Lower Extremity;  Surgeon: Wellington Hampshire, MD;  Location: [place] [facility];  Service: Cardiovascular;  Laterality: N/A;   CHOLECYSTECTOMY OPEN  03/31/2001   Dr Lindon Romp   COLON SURGERY     EXPLORATORY LAPAROTOMY  08/2004   Archie Endo 10/01/2010   HEMORRHOID SURGERY     HERNIA REPAIR     HIATAL HERNIA REPAIR  2002   INCISIONAL HERNIA REPAIR  08/2004   Archie Endo 10/01/2010   INSERT / REPLACE / REMOVE PACEMAKER     PERIPHERAL VASCULAR BALLOON ANGIOPLASTY  08/07/2016   Procedure: Peripheral Vascular Balloon Angioplasty;  Surgeon: Wellington Hampshire, MD;  Location: [place] [facility];  Service: Cardiovascular;;  left popliteal artery   PERIPHERAL VASCULAR INTERVENTION  08/14/2016   popliteal artery/notes 08/14/2016   PERIPHERAL VASCULAR  INTERVENTION Left 08/14/2016   Procedure: Peripheral Vascular Intervention;  Surgeon: Wellington Hampshire, MD;  Location: [place] [facility];  Service: Cardiovascular;  Laterality: Left;  popliteal artery   PERMANENT PACEMAKER GENERATOR CHANGE N/A 04/06/2012   Procedure: PERMANENT PACEMAKER GENERATOR CHANGE;  Surgeon: Evans Lance, MD;  Location: [facility] C[facility] LAB;  Service: Cardiovascular;  Laterality: N/A;   PPM GENERATOR CHANGEOUT N/A 02/04/2022   Procedure: PPM GENERATOR CHANGEOUT;  Surgeon: Evans Lance, MD;  Location: [place] [facility];  Service: Cardiovascular;  Laterality: N/A;   SMALL INTESTINE SURGERY  09/16/2004   SBO resection, VH repair     Initial Focused Assessment (If applicable, or please see trauma documentation): - Initial GCS 14 (unaware of events that happened and also of the year).   - Pt very hard of hearing - Lac to R eyebrow - C/O no pain - PERRLA 2's sluggish - No other external trauma other than the head. - 20G PIV to R AC  CT's Completed:   CT Head and CT C-Spine   Interventions:  - Placed c-collar upon arrival to trauma C - Trauma labs drawn - CXR - Pelvic XR - CT head and c-spine - LR bolus   Plan for disposition:  Other Cards consult d/t new medtronic pacemaker that was replaced yesterday.  Awaiting dispo plan.  Consults completed:  none at 1000.  Event Summary: Pt  [facility]IB [facility] after falling at home.  Pt lives at home alone but claims he has a lot of people that come throughout the day to check on him.  Pt was trying to sit at the kitchen table and lost his balance resulting in a fall and hitting the left side of his body and his left face.  Pt does not remember falling but his GCS gradually improving to 15.   Bedside handoff with ED RN Burman Nieves and Orlin Hilding, Josefina Do  Trauma Response RN  Please call TRN at [phone] for further assistance.

## 2022-02-05 NOTE — ED Notes (Signed)
Lab called again to add troponin on initial labs

## 2022-02-05 NOTE — ED Notes (Signed)
Patient transported to CT with TRN.  

## 2022-02-05 NOTE — Progress Notes (Signed)
This chaplain responded to Level 2 trauma with the medical team.   The Pt. is communicating with the medical team. The chaplain understands the Pt. is from home and family may be present. The chaplain is available for F/U spiritual care as needed as the Pt. care needs are discerned.  [phone]

## 2022-02-05 NOTE — Discharge Instructions (Addendum)
Today you were seen in the emergency department for your fall.    In the emergency department you had a CT of your head and your pacemaker interrogated which was normal.    At home, please ice your bruises and swelling. Please leave your stiches dry for the first 24 hours and do not submerge your head in water until the stitches are removed.  These are not absorbable and will need to be removed in 5 days.   Check your MyChart online for the results of any tests that had not resulted by the time you left the emergency department.   Follow-up with your primary doctor in 2-3 days regarding your visit. Please also follow-up with your cardiologist.   Return immediately to the emergency department if you experience any of the following: severe headaches, numbness of your arms or legs, chest pain, shortness of breath, or any other concerning symptoms.    Thank you for visiting our Emergency Department. It was a pleasure taking care of you today.

## 2022-02-05 NOTE — ED Provider Notes (Signed)
[facility] EMERGENCY DEPARTMENT Provider Note   CSN: [id number] Arrival date & time: 02/05/22  0912     History  Chief Complaint  Patient presents with   Fall    On thinners. Level 2     [name] is a 88 y.o. male.  88 year old male with a history of atrial fibrillation on Xarelto, tachybradycardia syndrome status post PM placement, 02/04/2022, CKD who presents to the emergency department after a fall.  Per EMS patient was walking over to a chair and went to sit down and fell striking his head.  They state that he is on Xarelto for his atrial fibrillation with last dose last night.  Also has family members that were present when he had his fall at home.  Unclear if they lived with him or were just visiting.  Patient unable to provide history about the fall.  He is alert and oriented x3 at this time.  Unsure if he lost consciousness and does not recall falling.  Denies any chest pain, shortness of breath, or dizziness currently.  Denies any neck pain, back pain, or hip pain.   Past Medical History:  Diagnosis Date   Arthritis    Atrial fibrillation ([place])    CKD (chronic kidney disease)    History of hiatal hernia    Hx SBO    Last 2013 all treated conservatively   Hypertension    Presence of permanent cardiac pacemaker    Prostate cancer [facility])    Stroke ([place]) 2017   left facial drooping noted 08/14/2016   Tachycardia-bradycardia syndrome ([place])    Type II diabetes mellitus ([place])    Ventral hernia    x3       Home Medications Prior to Admission medications   Medication Sig Start Date End Date Taking? Authorizing Provider  atorvastatin (LIPITOR) 10 MG tablet Take 1 tablet by mouth at bedtime. 03/28/21   [provider]  divalproex (DEPAKOTE SPRINKLES) 125 MG capsule Take 1 capsule (125 mg total) by mouth 2 (two) times daily. 01/28/22   Garvin Fila, MD  docusate sodium (COLACE) 100 MG capsule Take 1 capsule (100 mg total) by mouth every  other day. 01/01/22   Geradine Girt, DO  ergocalciferol (VITAMIN D2) 1.25 MG (50000 UT) capsule Take 1 capsule (50,000 Units total) by mouth once a week. 01/30/22   [name], MD  ferrous sulfate (FEROSUL) 325 (65 FE) MG tablet Take 1 tablet (325 mg total) by mouth every other day. Patient taking differently: Take 325 mg by mouth daily. 11/30/21   [name], MD  fexofenadine (ALLEGRA) 180 MG tablet Take 180 mg by mouth daily as needed for allergies or rhinitis.    [provider]  finasteride (PROSCAR) 5 MG tablet Take 1 tablet (5 mg total) by mouth daily. Patient taking differently: Take 5 mg by mouth every evening. 01/15/22   [name], MD  folic acid (FOLVITE) 1 MG tablet Take 1 tablet (1 mg total) by mouth daily. 11/30/21 11/30/22  [name], MD  furosemide (LASIX) 20 MG tablet Take 1 tablet (20 mg total) by mouth daily. 12/10/21   Baldwin Jamaica, PA-C  haloperidol (HALDOL) 2 MG tablet Take 1 mg by mouth daily as needed (anxiety).    [provider]  JARDIANCE 25 MG TABS tablet Take 12.5 mg by mouth daily. 12/06/21   [provider]  latanoprost (XALATAN) 0.005 % ophthalmic solution Place 1 drop into both  eyes at bedtime. 05/03/20   [provider]  memantine (NAMENDA) 10 MG tablet Take 1 tablet (10 mg total) by mouth 2 (two) times daily. 11/26/21   Garvin Fila, MD  metoprolol succinate (TOPROL-XL) 25 MG 24 hr tablet Take 1 tablet (25 mg total) by mouth daily. 01/02/22   Geradine Girt, DO  Nutritional Supplements (GLUCERNA PO) Take 1 Bottle by mouth once a week.    [provider]  pantoprazole (PROTONIX) 40 MG tablet TAKE 1 TABLET(40 MG) BY MOUTH DAILY 01/01/22   [name], MD  polyethylene glycol powder [facility]) 17 GM/SCOOP powder Take 17 g by mouth 2 (two) times daily as needed. 08/03/21   McElwee, Lauren A, NP  Pseudoeph-Doxylamine-DM-APAP (NYQUIL PO) Take 1 Dose by mouth  daily as needed (sleep).    [provider]  QUEtiapine (SEROQUEL) 25 MG tablet Take 1 tablet (25 mg total) by mouth at bedtime. 01/15/22   [name], MD  tamsulosin (FLOMAX) 0.4 MG CAPS capsule Take 1 capsule (0.4 mg total) by mouth daily. 12/04/21   [name], MD  vitamin B-12 (CYANOCOBALAMIN) 500 MCG tablet Take 500 mcg by mouth daily.    [provider]  XARELTO 15 MG TABS tablet TAKE 1 TABLET DAILY WITH SUPPER 01/22/22   [name], MD      Allergies    Patient has no known allergies.    Review of Systems   Review of Systems  Physical Exam Updated Vital Signs BP 110/84 (BP Location: Left Arm)   Pulse 89   Temp (!) 97.3 F (36.3 C) (Oral)   Resp 15   Ht '5\' 11"'$  (1.803 m)   Wt 61.2 kg   SpO2 97%   BMI 18.83 kg/m  Physical Exam Vitals and nursing note reviewed.  Constitutional:      General: He is not in acute distress.    Appearance: He is well-developed.  HENT:     Head: Normocephalic.     Comments: Laceration to left temple    Right Ear: External ear normal.     Left Ear: External ear normal.     Nose: Nose normal.  Eyes:     Extraocular Movements: Extraocular movements intact.     Conjunctiva/sclera: Conjunctivae normal.     Pupils: Pupils are equal, round, and reactive to light.     Comments: 3 mm bilaterally  Neck:     Comments: Cervical collar in place. Cardiovascular:     Rate and Rhythm: Normal rate. Rhythm irregular.     Heart sounds: Normal heart sounds.  Pulmonary:     Effort: Pulmonary effort is normal. No respiratory distress.     Breath sounds: Normal breath sounds.  Abdominal:     General: There is no distension.     Palpations: Abdomen is soft. There is no mass.     Tenderness: There is no abdominal tenderness. There is no guarding.     Hernia: A hernia is present.  Musculoskeletal:        General: No swelling.     Right lower leg: Edema (Trace) present.     Left lower leg: Edema (Trace)  present.     Comments: No tenderness to palpation of bilateral shoulders, elbows, or wrists including snuffbox.  No tenderness to palpation of bilateral hips, knees, or ankles.  Full range of motion of bilateral upper and lower extremities.  Notes focal, thoracic, or lumbar midline tenderness to palpation of the spine or step-offs  noted.  Skin:    General: Skin is warm and dry.     Capillary Refill: Capillary refill takes less than 2 seconds.  Neurological:     Mental Status: He is alert and oriented to person, place, and time. Mental status is at baseline.     Sensory: No sensory deficit.     Motor: No weakness.  Psychiatric:        Mood and Affect: Mood normal.        Behavior: Behavior normal.     ED Results / Procedures / Treatments   Labs (all labs ordered are listed, but only abnormal results are displayed) Labs Reviewed  COMPREHENSIVE METABOLIC PANEL - Abnormal; Notable for the following components:      Result Value   Chloride 112 (*)    Glucose, Bld 157 (*)    BUN 31 (*)    Creatinine, Ser 2.26 (*)    Calcium 8.0 (*)    Total Protein 5.6 (*)    Albumin 3.0 (*)    GFR, Estimated 26 (*)    All other components within normal limits  CBC - Abnormal; Notable for the following components:   RBC 3.70 (*)    Hemoglobin 9.6 (*)    HCT 29.5 (*)    MCV 79.7 (*)    MCH 25.9 (*)    RDW 16.1 (*)    Platelets 147 (*)    All other components within normal limits  URINALYSIS, ROUTINE W REFLEX MICROSCOPIC - Abnormal; Notable for the following components:   Glucose, UA >=500 (*)    All other components within normal limits  PROTIME-INR - Abnormal; Notable for the following components:   Prothrombin Time 16.1 (*)    INR 1.3 (*)    All other components within normal limits  I-STAT CHEM 8, ED - Abnormal; Notable for the following components:   BUN 39 (*)    Creatinine, Ser 2.30 (*)    Glucose, Bld 137 (*)    Calcium, Ion 0.95 (*)    Hemoglobin 10.2 (*)    HCT 30.0 (*)    All  other components within normal limits  TROPONIN I (HIGH SENSITIVITY) - Abnormal; Notable for the following components:   Troponin I (High Sensitivity) 27 (*)    All other components within normal limits  TROPONIN I (HIGH SENSITIVITY) - Abnormal; Notable for the following components:   Troponin I (High Sensitivity) 31 (*)    All other components within normal limits  ETHANOL  LACTIC ACID, PLASMA  APTT  CK  SAMPLE TO BLOOD BANK    EKG EKG Interpretation  Date/Time:  Tuesday February 05 2022 09:20:34 EDT Ventricular Rate:  95 PR Interval:    QRS Duration: 95 QT Interval:  399 QTC Calculation: 502 R Axis:   -15 Text Interpretation: Atrial fibrillation Borderline left axis deviation Probable anterior infarct, age indeterminate Lateral leads are also involved Prolonged QT interval When compared to 01/19/22 no significant changes noted Confirmed by Margaretmary Eddy [phone]) on 02/05/2022 9:58:04 AM  Radiology CT CERVICAL SPINE WO CONTRAST  Result Date: 02/05/2022 CLINICAL DATA:  Polytrauma, blunt.  Fall. EXAM: CT CERVICAL SPINE WITHOUT CONTRAST TECHNIQUE: Multidetector CT imaging of the cervical spine was performed without intravenous contrast. Multiplanar CT image reconstructions were also generated. RADIATION DOSE REDUCTION: This exam was performed according to the departmental dose-optimization program which includes automated exposure control, adjustment of the mA and/or kV according to patient size and/or use of iterative reconstruction technique. COMPARISON:  05/13/2020 FINDINGS:  Alignment: No subluxation. Skull base and vertebrae: No acute fracture. No primary bone lesion or focal pathologic process. Soft tissues and spinal canal: No prevertebral fluid or swelling. No visible canal hematoma. Disc levels: Diffuse advanced degenerative disc disease and facet disease. No visible disc herniation. Upper chest: No acute findings.  Emphysema. Other: None IMPRESSION: No acute bony abnormality.  Electronically Signed   By: Rolm Baptise M.D.   On: 02/05/2022 10:03   CT HEAD WO CONTRAST  Result Date: 02/05/2022 CLINICAL DATA:  Fall.  Head trauma, moderate-severe EXAM: CT HEAD WITHOUT CONTRAST TECHNIQUE: Contiguous axial images were obtained from the base of the skull through the vertex without intravenous contrast. RADIATION DOSE REDUCTION: This exam was performed according to the departmental dose-optimization program which includes automated exposure control, adjustment of the mA and/or kV according to patient size and/or use of iterative reconstruction technique. COMPARISON:  05/24/2020 FINDINGS: Brain: There is atrophy and chronic small vessel disease changes. No acute intracranial abnormality. Specifically, no hemorrhage, hydrocephalus, mass lesion, acute infarction, or significant intracranial injury. Vascular: No hyperdense vessel or unexpected calcification. Skull: No acute calvarial abnormality. Sinuses/Orbits: No acute findings Other: None IMPRESSION: Atrophy, chronic microvascular disease. No acute intracranial abnormality. Electronically Signed   By: Rolm Baptise M.D.   On: 02/05/2022 10:01   DG Pelvis Portable  Result Date: 02/05/2022 CLINICAL DATA:  Level 2 trauma, fall. EXAM: PORTABLE PELVIS 1-2 VIEWS COMPARISON:  None Available. FINDINGS: Far lateral left greater trochanter is obscured from view. There is no evidence of pelvic fracture or diastasis. No pelvic bone lesions are seen. Subjective osteopenia. Prostate fiducial markers. Lower lumbar spine degeneration. IMPRESSION: No acute finding Electronically Signed   By: Jorje Guild M.D.   On: 02/05/2022 09:42   DG Chest Port 1 View  Result Date: 02/05/2022 CLINICAL DATA:  Trauma. EXAM: PORTABLE CHEST 1 VIEW COMPARISON:  None Available. FINDINGS: The heart size and mediastinal contours are within normal limits. Right lung is clear. Left-sided pacemaker is in grossly good position. Minimal left basilar atelectasis is noted with  possible small pleural effusion. The visualized skeletal structures are unremarkable. IMPRESSION: Minimal left basilar subsegmental atelectasis is noted with possible small left pleural effusion. Electronically Signed   By: Marijo Conception M.D.   On: 02/05/2022 09:41   EP PPM/ICD IMPLANT  Result Date: 02/04/2022 Conclusion: Successful removal of a previously implanted Medtronic single-chamber pacemaker and insertion of a new Medtronic single-chamber pacemaker into a subpectoral location secondary to the patient's very minimal subcutaneous tissue. Cristopher Peru, MD    Procedures .Marland KitchenLaceration Repair  Date/Time: 02/05/2022 4:44 PM  Performed by: Fransico Meadow, MD Authorized by: Fransico Meadow, MD   Anesthesia:    Anesthesia method:  Local infiltration   Local anesthetic:  Lidocaine 1% WITH epi Laceration details:    Location:  Scalp   Scalp location:  L temporal   Length (cm):  2 Treatment:    Amount of cleaning:  Standard   Irrigation solution:  Sterile water   Irrigation volume:  200   Irrigation method:  Pressure wash Approximation:    Approximation:  Close Repair type:    Repair type:  Simple Post-procedure details:    Dressing:  Sterile dressing   Procedure completion:  Tolerated well, no immediate complications    Medications Ordered in ED Medications  lactated ringers bolus 500 mL (0 mLs Intravenous Stopped 02/05/22 1116)  lidocaine-EPINEPHrine (XYLOCAINE W/EPI) 2 %-1:200000 (PF) injection 10 mL (10 mLs Intradermal Given 02/05/22 1059)  ED Course/ Medical Decision Making/ A&P Clinical Course as of 02/05/22 1643  Tue Feb 05, 2022  0956 Creatinine(!): 2.30 At baseline [RP]  0956 Hemoglobin(!): 9.6 At baseline [RP]  1024 Per daughter, was walking in the kitchen and started to fall and grabbed the kitchen chair which broke and he fell to the ground. No LOC.  [RP]  5277 PM interrogation without abnormality. [RP]  1208 Pt able to ambulate with a cane without  difficulty.  [RP]  1209 Gain additional history from the patient who states that he did not have any chest pain, shortness of breath, or dizziness prior to his fall.  Says that he was attempting to get dressed and lost his balance and attempted to grab onto the chair on the way down. [RP]  1403 Troponin I (High Sensitivity)(!): 31 Does not flag rise of over 20% given the patient's renal function and discussion with NP Turner from cardiology who states that troponin rise could be related to his procedure.  Feel that this may be due to his procedure rather than ischemia since he is not having any chest pain or shortness of breath at this time. [RP]    Clinical Course User Index [RP] Fransico Meadow, MD                           Medical Decision Making Amount and/or Complexity of Data Reviewed Labs: ordered. Decision-making details documented in ED Course. Radiology: ordered. ECG/medicine tests: ordered.  Risk Prescription drug management.   [name] is a 88 year old male with a history of atrial fibrillation on Xarelto, tachybradycardia syndrome status post PM placement, 02/04/2022, CKD who presents to the emergency department after a fall.   Initial Ddx:  Mechanical fall, syncope, ICH, C-spine injury, facial laceration  MDM:  Patient presented to the emergency department as a level 2 trauma given his anticoagulation and fall.  Did have a laceration on the side of his forehead.  No other signs of trauma on exam.  Patient was initially having difficulty providing additional history.  Per EMS report appeared to be a mechanical fall but due to the fact that the patient cannot provide additional history we will also work-up for syncope at this time.  Plan:  Labs Troponin EKG Chest x-ray and pelvis x-ray CT head and C-spine Device interrogation  ED Summary:  Patient had CT head and C-spine did not show evidence of ICH or C-spine injury.  His c-collar was cleared.  Labs showed  baseline renal function and did have mild elevation in troponin.  Discussed with cardiology who reported that this is possibly due to seizure.  His Medtronic device was interrogated and did not show any events making cardiogenic syncope highly unlikely.  Patient was able to provide additional history and feel that initial difficulty and history was likely obtained due to the fact that he was hard of hearing.  He later stated that he was putting on his close and lost balance and fell.  Stated that he did not have any chest pain, shortness of breath, or dizziness prior.  He had repeat troponin that was minimally elevated but was not over 20% of a rise from his prior and given his CKD and lack of cardiac symptoms feel that this may be related to his operation and does not appear to be due to MI.  Additionally, EKG did not show any ischemic changes.  Patient's laceration was repaired in the emergency department  with three 5-0 Prolene sutures that will need to be removed in approximately 5 days.  Did talk to his caregiver about wound care for him.  Dispo: DC Home. Return precautions discussed including, but not limited to, those listed in the AVS. Allowed pt time to ask questions which were answered fully prior to dc.   Additional history obtained from  daughter, son, caregiver Records reviewed OP Notes The following labs were independently interpreted: Chemistry and Serial Troponins I independently visualized the following imaging with scope of interpretation limited to determining acute life threatening conditions related to emergency care: CT Head, which revealed no acute abnormality   Final Clinical Impression(s) / ED Diagnoses Final diagnoses:  Contusion of face, initial encounter  Injury of head, initial encounter  Facial laceration, initial encounter    Rx / DC Orders ED Discharge Orders     None         Fransico Meadow, MD 02/05/22 1644

## 2022-02-05 NOTE — ED Triage Notes (Signed)
[name] from the patients pacemaker company called and reported no arrhythmias, lead placement looks great and is functioning as programmed.

## 2022-02-05 NOTE — ED Triage Notes (Signed)
Patient coming form home with [facility]. Patient was fine this morning according to EMS. But tried to sit at kitchen table lost his balance and hit the left side of his body. Denies LOC. A&Ox4. Patient does not remember falling

## 2022-02-06 ENCOUNTER — Other Ambulatory Visit: Payer: Self-pay | Admitting: Family Medicine

## 2022-02-06 DIAGNOSIS — G4700 Insomnia, unspecified: Secondary | ICD-10-CM

## 2022-02-06 NOTE — Telephone Encounter (Signed)
Has an appt with Provider 02/07/22.  Dm/cma

## 2022-02-07 ENCOUNTER — Ambulatory Visit (INDEPENDENT_AMBULATORY_CARE_PROVIDER_SITE_OTHER): Payer: Medicare PPO | Admitting: Family Medicine

## 2022-02-07 ENCOUNTER — Encounter: Payer: Self-pay | Admitting: Family Medicine

## 2022-02-07 VITALS — BP 104/68 | HR 66 | Temp 97.4°F | Ht 71.0 in | Wt 138.8 lb

## 2022-02-07 DIAGNOSIS — D509 Iron deficiency anemia, unspecified: Secondary | ICD-10-CM | POA: Diagnosis not present

## 2022-02-07 DIAGNOSIS — Z23 Encounter for immunization: Secondary | ICD-10-CM

## 2022-02-07 DIAGNOSIS — I952 Hypotension due to drugs: Secondary | ICD-10-CM | POA: Diagnosis not present

## 2022-02-07 DIAGNOSIS — E559 Vitamin D deficiency, unspecified: Secondary | ICD-10-CM

## 2022-02-07 DIAGNOSIS — S0181XD Laceration without foreign body of other part of head, subsequent encounter: Secondary | ICD-10-CM | POA: Diagnosis not present

## 2022-02-07 DIAGNOSIS — S0181XA Laceration without foreign body of other part of head, initial encounter: Secondary | ICD-10-CM | POA: Insufficient documentation

## 2022-02-07 MED ORDER — FERROUS SULFATE 325 (65 FE) MG PO TABS: 325.0000 mg | ORAL_TABLET | Freq: Every day | ORAL | 1 refills | Status: DC

## 2022-02-07 NOTE — Progress Notes (Addendum)
Established Patient Office Visit  Subjective   Patient ID: [name], male    DOB: 06-Apr-1924  Age: 88 y.o. MRN: [id number]  Chief Complaint  Patient presents with   Follow-up    Routine follow up on medications. Patient states that he feels weak a lot.     HPI for hospital discharge follow-up status post fall 3 days ago.  Sutured laceration in left periorbital area.  Blood pressure has been running low for some time now.  Daughter brings in multiple pressures that are in the less than 110/70 range.  Has been to the ER regularly for fluid boluses.  Ongoing anemia with iron deficiency.  Tolerating iron well every other day.  Requesting vitamin D refill.  No recent vitamin D level on the chart.    Review of Systems  Constitutional: Negative.   HENT: Negative.    Eyes:  Negative for blurred vision, discharge and redness.  Respiratory: Negative.    Cardiovascular: Negative.   Gastrointestinal:  Negative for abdominal pain and blood in stool.  Genitourinary: Negative.  Negative for hematuria.  Musculoskeletal: Negative.  Negative for myalgias.  Skin:  Negative for rash.  Neurological:  Negative for tingling, loss of consciousness and weakness.  Endo/Heme/Allergies:  Negative for polydipsia.      Objective:     BP 104/68 (BP Location: Right Arm, Patient Position: Sitting, Cuff Size: Normal)   Pulse 66   Temp (!) 97.4 F (36.3 C) (Temporal)   Ht '5\' 11"'$  (1.803 m)   Wt 138 lb 12.8 oz (63 kg)   BMI 19.36 kg/m    Physical Exam Constitutional:      General: He is not in acute distress.    Appearance: Normal appearance. He is not ill-appearing, toxic-appearing or diaphoretic.  HENT:     Head: Normocephalic and atraumatic.     Right Ear: External ear normal.     Left Ear: External ear normal.  Eyes:     General: No scleral icterus.       Right eye: No discharge.        Left eye: No discharge.     Extraocular Movements: Extraocular movements intact.      Conjunctiva/sclera: Conjunctivae normal.  Cardiovascular:     Rate and Rhythm: Normal rate and regular rhythm.  Pulmonary:     Effort: Pulmonary effort is normal. No respiratory distress.     Breath sounds: Normal breath sounds. No rhonchi.  Abdominal:     General: Bowel sounds are normal.     Tenderness: There is no abdominal tenderness. There is no guarding.  Musculoskeletal:     Cervical back: No rigidity or tenderness.     Right lower leg: No edema.     Left lower leg: No edema.  Skin:    General: Skin is warm and dry.  Neurological:     Mental Status: He is alert and oriented to person, place, and time.  Psychiatric:        Mood and Affect: Mood normal.        Behavior: Behavior normal.      No results found for any visits on 02/07/22.    The ASCVD Risk score (Arnett DK, et al., 2019) failed to calculate for the following reasons:   The 2019 ASCVD risk score is only valid for ages 65 to 36   The patient has a prior MI or stroke diagnosis    Assessment & Plan:   Problem List Items Addressed This Visit  Cardiovascular and Mediastinum   Hypotension due to drugs - Primary     Other   Microcytic anemia   Relevant Medications   ferrous sulfate (FEROSUL) 325 (65 FE) MG tablet   Iron deficiency anemia   Relevant Medications   ferrous sulfate (FEROSUL) 325 (65 FE) MG tablet   Vitamin D deficiency   Relevant Orders   VITAMIN D 25 Hydroxy (Vit-D Deficiency, Fractures)   Facial laceration   Need for influenza vaccination   Relevant Orders   Flu vaccine HIGH DOSE PF (Fluzone High dose)    Return in about 1 week (around 02/14/2022).    [name], MD

## 2022-02-08 ENCOUNTER — Other Ambulatory Visit: Payer: Self-pay

## 2022-02-08 ENCOUNTER — Encounter: Payer: Self-pay | Admitting: Family Medicine

## 2022-02-08 LAB — VITAMIN D 25 HYDROXY (VIT D DEFICIENCY, FRACTURES): VITD: 69.85 ng/mL (ref 30.00–100.00)

## 2022-02-10 ENCOUNTER — Emergency Department (HOSPITAL_BASED_OUTPATIENT_CLINIC_OR_DEPARTMENT_OTHER)
Admission: EM | Admit: 2022-02-10 | Discharge: 2022-02-11 | Disposition: A | Discharge status: Home / Self Care | Payer: Medicare PPO | Attending: Emergency Medicine | Admitting: Emergency Medicine

## 2022-02-10 ENCOUNTER — Emergency Department (HOSPITAL_BASED_OUTPATIENT_CLINIC_OR_DEPARTMENT_OTHER): Payer: Medicare PPO

## 2022-02-10 ENCOUNTER — Encounter (HOSPITAL_BASED_OUTPATIENT_CLINIC_OR_DEPARTMENT_OTHER): Payer: Self-pay

## 2022-02-10 ENCOUNTER — Other Ambulatory Visit: Payer: Self-pay

## 2022-02-10 ENCOUNTER — Encounter: Payer: Self-pay | Admitting: Family Medicine

## 2022-02-10 DIAGNOSIS — Z79899 Other long term (current) drug therapy: Secondary | ICD-10-CM | POA: Diagnosis not present

## 2022-02-10 DIAGNOSIS — Z8546 Personal history of malignant neoplasm of prostate: Secondary | ICD-10-CM | POA: Insufficient documentation

## 2022-02-10 DIAGNOSIS — R531 Weakness: Secondary | ICD-10-CM

## 2022-02-10 DIAGNOSIS — I129 Hypertensive chronic kidney disease with stage 1 through stage 4 chronic kidney disease, or unspecified chronic kidney disease: Secondary | ICD-10-CM | POA: Insufficient documentation

## 2022-02-10 DIAGNOSIS — E1122 Type 2 diabetes mellitus with diabetic chronic kidney disease: Secondary | ICD-10-CM | POA: Insufficient documentation

## 2022-02-10 DIAGNOSIS — N189 Chronic kidney disease, unspecified: Secondary | ICD-10-CM | POA: Insufficient documentation

## 2022-02-10 DIAGNOSIS — E1165 Type 2 diabetes mellitus with hyperglycemia: Secondary | ICD-10-CM | POA: Insufficient documentation

## 2022-02-10 DIAGNOSIS — Z7901 Long term (current) use of anticoagulants: Secondary | ICD-10-CM | POA: Diagnosis not present

## 2022-02-10 DIAGNOSIS — R5383 Other fatigue: Secondary | ICD-10-CM | POA: Insufficient documentation

## 2022-02-10 DIAGNOSIS — Z950 Presence of cardiac pacemaker: Secondary | ICD-10-CM | POA: Diagnosis not present

## 2022-02-10 DIAGNOSIS — Z7984 Long term (current) use of oral hypoglycemic drugs: Secondary | ICD-10-CM | POA: Insufficient documentation

## 2022-02-10 DIAGNOSIS — I4891 Unspecified atrial fibrillation: Secondary | ICD-10-CM | POA: Insufficient documentation

## 2022-02-10 DIAGNOSIS — Z20822 Contact with and (suspected) exposure to covid-19: Secondary | ICD-10-CM | POA: Diagnosis not present

## 2022-02-10 DIAGNOSIS — R739 Hyperglycemia, unspecified: Secondary | ICD-10-CM

## 2022-02-10 LAB — COMPREHENSIVE METABOLIC PANEL
ALT: 15 U/L (ref 0–44)
AST: 28 U/L (ref 15–41)
Albumin: 3.3 g/dL — ABNORMAL LOW (ref 3.5–5.0)
Alkaline Phosphatase: 45 U/L (ref 38–126)
Anion gap: 10 (ref 5–15)
BUN: 24 mg/dL — ABNORMAL HIGH (ref 8–23)
CO2: 22 mmol/L (ref 22–32)
Calcium: 8 mg/dL — ABNORMAL LOW (ref 8.9–10.3)
Chloride: 108 mmol/L (ref 98–111)
Creatinine, Ser: 2.19 mg/dL — ABNORMAL HIGH (ref 0.61–1.24)
GFR, Estimated: 27 mL/min — ABNORMAL LOW (ref >60)
Glucose, Bld: 316 mg/dL — ABNORMAL HIGH (ref 70–99)
Potassium: 4.7 mmol/L (ref 3.5–5.1)
Sodium: 140 mmol/L (ref 135–145)
Total Bilirubin: 0.3 mg/dL (ref 0.3–1.2)
Total Protein: 5.5 g/dL — ABNORMAL LOW (ref 6.5–8.1)

## 2022-02-10 LAB — URINALYSIS, ROUTINE W REFLEX MICROSCOPIC
Bilirubin Urine: NEGATIVE
Glucose, UA: >1000 mg/dL — AB
Hgb urine dipstick: NEGATIVE
Ketones, ur: NEGATIVE mg/dL
Leukocytes,Ua: NEGATIVE
Nitrite: NEGATIVE
Protein, ur: NEGATIVE mg/dL
Specific Gravity, Urine: 1.017 (ref 1.005–1.030)
pH: 5.5 (ref 5.0–8.0)

## 2022-02-10 LAB — CBC WITH DIFFERENTIAL/PLATELET
Abs Immature Granulocytes: 0.03 10*3/uL (ref 0.00–0.07)
Basophils Absolute: 0 10*3/uL (ref 0.0–0.1)
Basophils Relative: 1 %
Eosinophils Absolute: 0.2 10*3/uL (ref 0.0–0.5)
Eosinophils Relative: 3 %
HCT: 26.7 % — ABNORMAL LOW (ref 39.0–52.0)
Hemoglobin: 8.6 g/dL — ABNORMAL LOW (ref 13.0–17.0)
Immature Granulocytes: 1 %
Lymphocytes Relative: 18 %
Lymphs Abs: 1.1 10*3/uL (ref 0.7–4.0)
MCH: 25.5 pg — ABNORMAL LOW (ref 26.0–34.0)
MCHC: 32.2 g/dL (ref 30.0–36.0)
MCV: 79.2 fL — ABNORMAL LOW (ref 80.0–100.0)
Monocytes Absolute: 0.7 10*3/uL (ref 0.1–1.0)
Monocytes Relative: 11 %
Neutro Abs: 4.2 10*3/uL (ref 1.7–7.7)
Neutrophils Relative %: 66 %
Platelets: 127 10*3/uL — ABNORMAL LOW (ref 150–400)
RBC: 3.37 MIL/uL — ABNORMAL LOW (ref 4.22–5.81)
RDW: 15.9 % — ABNORMAL HIGH (ref 11.5–15.5)
WBC: 6.2 10*3/uL (ref 4.0–10.5)
nRBC: 0 % (ref 0.0–0.2)

## 2022-02-10 LAB — RESP PANEL BY RT-PCR (FLU A&B, COVID) ARPGX2
Influenza A by PCR: NEGATIVE
Influenza B by PCR: NEGATIVE
SARS Coronavirus 2 by RT PCR: NEGATIVE

## 2022-02-10 LAB — TROPONIN I (HIGH SENSITIVITY): Troponin I (High Sensitivity): 27 ng/L — ABNORMAL HIGH (ref <18)

## 2022-02-10 LAB — CBG MONITORING, ED
Glucose-Capillary: 314 mg/dL — ABNORMAL HIGH (ref 70–99)
Glucose-Capillary: 204 mg/dL — ABNORMAL HIGH (ref 70–99)

## 2022-02-10 LAB — OCCULT BLOOD X 1 CARD TO LAB, STOOL: Fecal Occult Bld: NEGATIVE

## 2022-02-10 LAB — VALPROIC ACID LEVEL: Valproic Acid Lvl: 35 ug/mL — ABNORMAL LOW (ref 50.0–100.0)

## 2022-02-10 LAB — AMMONIA: Ammonia: 22 umol/L (ref 9–35)

## 2022-02-10 MED ORDER — LACTATED RINGERS IV BOLUS
1000.0000 mL | Freq: Once | INTRAVENOUS | Status: AC
  Administered 2022-02-10: 1000 mL via INTRAVENOUS

## 2022-02-10 NOTE — ED Provider Notes (Signed)
[place] EMERGENCY DEPT Provider Note   CSN: [id number] Arrival date & time: 02/10/22  1826     History  Chief Complaint  Patient presents with   Hyperglycemia   Hypotension    CALEM COCOZZA is a 88 y.o. male.  HPI     88 year old male with a history atrial fibrillation (on hold until 9/22 xarelto after recent procedure), CKD, tachybradycardia syndrome with pacemaker in place, CVA, prostate cancer, diabetes type 2, hypertension, history of pacemaker generator change out on September 18, PVD, who presents with concern for fatigue, low blood pressures, hyperglycemia.   Agitation, started on depakote, haldol Now is more calm, normally would be trying to leave  Has seemed more fatigued/lethargic, taking it BID   Stopped lasix with Dr. Ethelene Hal on 9/21  Started seroquel '25mg'$ , stop haldol 8/29 2 weeks ago, started depakote, stopped seroquel Now just on depakote for the last 2 weeks Drinking nyquil at night to help him sleep  Has been having unsteady gait [facility] Tuesday, Friday, BP low in 50 More confused, fatigued No new change in vision (but having problems) No shortness of breath now but did report it at church today No n/v/d Chills Friday night No fevers No urinary symptoms, abdominal pain  Had blood levels that were low before No black or bloody stools Has been having lightheadedness Denies numbness, focal weakness, difficulty talking or walking, visual changes or facial droop.   Gen weakness No increase in leg swelling  Does have hx of PVD, they discussed doing ABI on him in July with Dr. Fletcher Anon but have not done it yet  He denies ay leg pain at this time   Decreased appetite, but ate well today  Falling, tired, sleeping for 4-5 hours, concerned this sleepiness started with the depakote  Past Medical History:  Diagnosis Date   Arthritis    Atrial fibrillation ([place])    CKD (chronic kidney disease)    History of hiatal hernia    Hx SBO     Last 2013 all treated conservatively   Hypertension    Presence of permanent cardiac pacemaker    Prostate cancer [facility])    Stroke ([place]) 2017   left facial drooping noted 08/14/2016   Tachycardia-bradycardia syndrome ([place])    Type II diabetes mellitus ([place])    Ventral hernia    x3     Home Medications Prior to Admission medications   Medication Sig Start Date End Date Taking? Authorizing Provider  atorvastatin (LIPITOR) 10 MG tablet Take 1 tablet by mouth at bedtime. 03/28/21   [provider]  divalproex (DEPAKOTE SPRINKLES) 125 MG capsule Take 1 capsule (125 mg total) by mouth 2 (two) times daily. 01/28/22   Garvin Fila, MD  docusate sodium (COLACE) 100 MG capsule Take 1 capsule (100 mg total) by mouth every other day. 01/01/22   Geradine Girt, DO  ferrous sulfate (FEROSUL) 325 (65 FE) MG tablet Take 1 tablet (325 mg total) by mouth daily. 02/07/22   [name], MD  fexofenadine (ALLEGRA) 180 MG tablet Take 180 mg by mouth daily as needed for allergies or rhinitis. Patient not taking: Reported on 02/07/2022    [provider]  finasteride (PROSCAR) 5 MG tablet Take 1 tablet (5 mg total) by mouth daily. Patient taking differently: Take 5 mg by mouth every evening. 01/15/22   [name], MD  folic acid (FOLVITE) 1 MG tablet Take 1 tablet (1 mg total) by mouth daily. 11/30/21 11/30/22  [name], MD  JARDIANCE 25 MG TABS tablet Take 12.5 mg by mouth daily. 12/06/21   [provider]  latanoprost (XALATAN) 0.005 % ophthalmic solution Place 1 drop into both eyes at bedtime. 05/03/20   [provider]  memantine (NAMENDA) 10 MG tablet Take 1 tablet (10 mg total) by mouth 2 (two) times daily. 11/26/21   Garvin Fila, MD  metoprolol succinate (TOPROL-XL) 25 MG 24 hr tablet Take 1 tablet (25 mg total) by mouth daily. 01/02/22   Geradine Girt, DO  Nutritional Supplements (GLUCERNA PO) Take 1 Bottle by mouth once a week.     [provider]  pantoprazole (PROTONIX) 40 MG tablet TAKE 1 TABLET(40 MG) BY MOUTH DAILY 01/01/22   [name], MD  polyethylene glycol powder [facility]) 17 GM/SCOOP powder Take 17 g by mouth 2 (two) times daily as needed. 08/03/21   McElwee, Lauren A, NP  Pseudoeph-Doxylamine-DM-APAP (NYQUIL PO) Take 1 Dose by mouth daily as needed (sleep).    [provider]  tamsulosin (FLOMAX) 0.4 MG CAPS capsule Take 1 capsule (0.4 mg total) by mouth daily. 12/04/21   [name], MD  vitamin B-12 (CYANOCOBALAMIN) 500 MCG tablet Take 500 mcg by mouth daily.    [provider]  XARELTO 15 MG TABS tablet TAKE 1 TABLET DAILY WITH SUPPER 01/22/22   [name], MD      Allergies    Patient has no known allergies.    Review of Systems   Review of Systems  Physical Exam Updated Vital Signs BP 115/71   Pulse 84   Temp 97.7 F (36.5 C)   Resp 18   Ht '5\' 11"'$  (1.803 m)   Wt 62.6 kg   SpO2 100%   BMI 19.25 kg/m  Physical Exam Vitals and nursing note reviewed.  Constitutional:      General: He is not in acute distress.    Appearance: He is well-developed. He is not diaphoretic.  HENT:     Head: Normocephalic and atraumatic.  Eyes:     Conjunctiva/sclera: Conjunctivae normal.  Cardiovascular:     Rate and Rhythm: Normal rate and regular rhythm.     Heart sounds: Normal heart sounds. No murmur heard.    No friction rub. No gallop.  Pulmonary:     Effort: Pulmonary effort is normal. No respiratory distress.     Breath sounds: Normal breath sounds. No wheezing or rales.  Abdominal:     General: There is no distension.     Palpations: Abdomen is soft.     Tenderness: There is no abdominal tenderness. There is no guarding.  Musculoskeletal:     Cervical back: Normal range of motion.  Skin:    General: Skin is warm and dry.  Neurological:     Mental Status: He is alert and oriented to person, place, and time.     Comments: Well  appearing, hard of hearing Excellent bilateral strength upper and lower extremities, reports normal sensation, no facial droop, normal EOM, no visual field abnormalities, normal coordination, midline tongue     ED Results / Procedures / Treatments   Labs (all labs ordered are listed, but only abnormal results are displayed) Labs Reviewed  CBC WITH DIFFERENTIAL/PLATELET - Abnormal; Notable for the following components:      Result Value   RBC 3.37 (*)    Hemoglobin 8.6 (*)    HCT 26.7 (*)    MCV 79.2 (*)    [facility]  25.5 (*)    RDW 15.9 (*)    Platelets 127 (*)    All other components within normal limits  COMPREHENSIVE METABOLIC PANEL - Abnormal; Notable for the following components:   Glucose, Bld 316 (*)    BUN 24 (*)    Creatinine, Ser 2.19 (*)    Calcium 8.0 (*)    Total Protein 5.5 (*)    Albumin 3.3 (*)    GFR, Estimated 27 (*)    All other components within normal limits  URINALYSIS, ROUTINE W REFLEX MICROSCOPIC - Abnormal; Notable for the following components:   Glucose, UA >1,000 (*)    All other components within normal limits  VALPROIC ACID LEVEL - Abnormal; Notable for the following components:   Valproic Acid Lvl 35 (*)    All other components within normal limits  CBG MONITORING, ED - Abnormal; Notable for the following components:   Glucose-Capillary 314 (*)    All other components within normal limits  CBG MONITORING, ED - Abnormal; Notable for the following components:   Glucose-Capillary 204 (*)    All other components within normal limits  TROPONIN I (HIGH SENSITIVITY) - Abnormal; Notable for the following components:   Troponin I (High Sensitivity) 27 (*)    All other components within normal limits  RESP PANEL BY RT-PCR (FLU A&B, COVID) ARPGX2  AMMONIA  OCCULT BLOOD X 1 CARD TO LAB, STOOL  TROPONIN I (HIGH SENSITIVITY)    EKG EKG Interpretation  Date/Time:  'Sunday February 10 2022 20:10:15 EDT Ventricular Rate:  93 PR Interval:    QRS  Duration: 96 QT Interval:  397 QTC Calculation: 494 R Axis:   -18 Text Interpretation: Atrial fibrillation Borderline left axis deviation Probable anterior infarct, age indeterminate ST elevation, consider inferior injury Lateral leads are also involved No significant change since prior 9/23 Confirmed by Butler, Michael ([id number]) on 02/11/2022 12:16:41 PM  Radiology DG Chest Port 1 View  Result Date: 02/10/2022 CLINICAL DATA:  Fatigue EXAM: PORTABLE CHEST 1 VIEW COMPARISON:  02/05/2022 FINDINGS: Left pacer remains in place, unchanged. Heart is normal size. No confluent opacities or effusions. No acute bony abnormality. IMPRESSION: No active disease. Electronically Signed   By: Kevin  Dover M.D.   On: 02/10/2022 22:03    Procedures Procedures    Medications Ordered in ED Medications  lactated ringers bolus 1,000 mL (0 mLs Intravenous Stopped 02/10/22 2140)    ED Course/ Medical Decision Making/ A&P                            89'$  year old male with a history atrial fibrillation (on hold until 9/22 xarelto after recent procedure), CKD, tachybradycardia syndrome with pacemaker in place, CVA, prostate cancer, diabetes type 2, hypertension, history of pacemaker generator change out on September 18, PVD, who presents with concern for fatigue, low blood pressures, low appetite, hyperglycemia.   Differential diagnosis includes anemia, electrolyte abnormality, cardiac abnormality, infection, hypothyroidism, DKA, other toxic/metabolic/medication related abnormalities, decline due to advanced age.    No focal neurologic concerns on history or exam to suggest CVA, ICH or other central etiology.  Had a CT head completed recently after fall 9/19 and has not had trauma since that time.  Urinalysis shows hyperglycemia without ketonuria or signs of infection.   Chest x-ray shows no pneumonia, ACS, or pulmonary edema.    CMP shows hyperglycemia to 316 without sings of DKA or other significant electrolyte  abnormalities. CKD at baseline.  EKG was not changed from prior, patient does not have chest pain, and troponin stable from prior values and have low suspicion for cardiac etiology of symptoms.  Does not have increased pain in legs at this time or changes to suggest PVD as etiology of falls.  Has excellent strength in LE, normal sensation, do not suspect acute spinal etiology or GBS as etiology of gait abnormality/falls. COVID/flu testing negative. No sign of hepatic encephalopathy or high depakote levels.   Does have anemia that has worsened from most recent values. No hx of signs of GI bleed, hemoccult negative today. He has had waxing and waning anemia (admission with hgb 7s that improved without transfusion) 8/14.  Do not feel he requires admission for this at this time.   Granddaughter noted his symptoms seemed to begin after starting depakote, so recommend changing dose to once per day at night and monitoring symptoms. It is possible that he has fatigue related to medication change, hyperglycemia, and possibly secondary to flu shot.  Given 500cc of fluid. His PCP is holding his diuretic and following a long closely.  Feel he is stable for continued outpatient management of symptoms and glucose. His blood pressures in the ED with exception of initial value of 99 systolic have been normal.  Patient discharged in stable condition with understanding of reasons to return.           Final Clinical Impression(s) / ED Diagnoses Final diagnoses:  Generalized weakness  Hyperglycemia    Rx / DC Orders ED Discharge Orders     None         Gareth Morgan, MD 02/11/22 1546

## 2022-02-10 NOTE — ED Triage Notes (Signed)
Pt arrives today c/o fatigue. Pt has hx of coming in for fluid boluses. Pt's sugar has been running high (364), and his pressures have been running low (80/52).

## 2022-02-10 NOTE — ED Notes (Signed)
Pt's CBG result was 314. [place] - Triage RN aware.

## 2022-02-10 NOTE — Discharge Instructions (Addendum)
You may decrease the depakote to once daily at night and follow up with your primary care doctor.

## 2022-02-11 NOTE — Addendum Note (Signed)
Addended by: Abelino Derrick A on: 02/11/2022 12:20 PM   Modules accepted: Orders

## 2022-02-11 NOTE — ED Notes (Signed)
Pt verbalizes understanding of discharge instructions. Opportunity for questioning and answers were provided. Pt discharged from ED to home with family.    

## 2022-02-13 ENCOUNTER — Other Ambulatory Visit: Payer: Self-pay | Admitting: Family Medicine

## 2022-02-13 DIAGNOSIS — E559 Vitamin D deficiency, unspecified: Secondary | ICD-10-CM

## 2022-02-14 ENCOUNTER — Ambulatory Visit: Payer: Medicare PPO | Attending: Student | Admitting: Student

## 2022-02-14 ENCOUNTER — Encounter: Payer: Self-pay | Admitting: Family Medicine

## 2022-02-14 ENCOUNTER — Ambulatory Visit (INDEPENDENT_AMBULATORY_CARE_PROVIDER_SITE_OTHER): Payer: Medicare PPO | Admitting: Family Medicine

## 2022-02-14 VITALS — BP 98/66 | HR 98 | Temp 97.6°F | Ht 71.0 in | Wt 145.0 lb

## 2022-02-14 DIAGNOSIS — R350 Frequency of micturition: Secondary | ICD-10-CM | POA: Diagnosis not present

## 2022-02-14 DIAGNOSIS — F03911 Unspecified dementia, unspecified severity, with agitation: Secondary | ICD-10-CM | POA: Diagnosis not present

## 2022-02-14 DIAGNOSIS — Z4802 Encounter for removal of sutures: Secondary | ICD-10-CM

## 2022-02-14 DIAGNOSIS — E119 Type 2 diabetes mellitus without complications: Secondary | ICD-10-CM

## 2022-02-14 DIAGNOSIS — Z950 Presence of cardiac pacemaker: Secondary | ICD-10-CM

## 2022-02-14 LAB — CUP PACEART INCLINIC DEVICE CHECK
Battery Remaining Longevity: 172 mo
Battery Voltage: 3.1 V
Brady Statistic RV Percent Paced: 16.24 %
Date Time Interrogation Session: 20230928113956
Implantable Lead Implant Date: 20060510
Implantable Lead Location: 753860
Implantable Lead Model: 5076
Implantable Pulse Generator Implant Date: 20230918
Lead Channel Impedance Value: 266 Ohm
Lead Channel Impedance Value: 361 Ohm
Lead Channel Pacing Threshold Amplitude: 1 V
Lead Channel Pacing Threshold Pulse Width: 0.4 ms
Lead Channel Sensing Intrinsic Amplitude: 13.625 mV
Lead Channel Sensing Intrinsic Amplitude: 15 mV
Lead Channel Setting Pacing Amplitude: 2.5 V
Lead Channel Setting Pacing Pulse Width: 0.4 ms
Lead Channel Setting Sensing Sensitivity: 1.2 mV

## 2022-02-14 MED ORDER — DIVALPROEX SODIUM 125 MG PO DR TAB: DELAYED_RELEASE_TABLET | ORAL | 1 refills | Status: DC

## 2022-02-14 MED ORDER — PIOGLITAZONE HCL 15 MG PO TABS: 15.0000 mg | ORAL_TABLET | Freq: Every day | ORAL | 5 refills | Status: DC

## 2022-02-14 NOTE — Progress Notes (Signed)
Wound check appointment. Steri-strips removed. Wound without redness or edema. Incision edges approximated, wound well healed. Normal device function. Thresholds, sensing, and impedances consistent with implant measurements. Device programmed at chronic safety margin. Histogram distribution appropriate for patient and level of activity. No mode switches or ventricular arrhythmias noted. Patient educated about wound care, arm mobility, and lifting restrictions. ROV in 3 months with Dr. Lovena Le.   See scanned report.

## 2022-02-14 NOTE — Progress Notes (Signed)
Established Patient Office Visit  Subjective   Patient ID: [name], male    DOB: August 20, 1923  Age: 88 y.o. MRN: [id number]  Chief Complaint  Patient presents with   Hospitalization [facility] follow up, would like urine checked for UTI very strong odor dark in color.     HPI follow-up of urinary frequency.  Urine has a strong odor.  Here for follow-up of suture removal.  Sutures placed in emergency room.    Review of Systems  Constitutional: Negative.   HENT:  Positive for hearing loss.   Eyes:  Negative for blurred vision, discharge and redness.  Respiratory: Negative.    Cardiovascular: Negative.   Gastrointestinal:  Negative for abdominal pain.  Genitourinary: Negative.   Musculoskeletal: Negative.  Negative for myalgias.  Skin:  Negative for rash.  Neurological:  Negative for tingling, loss of consciousness and weakness.  Endo/Heme/Allergies:  Negative for polydipsia.  Psychiatric/Behavioral:  Positive for memory loss.       Objective:     BP 98/66 (BP Location: Right Arm, Patient Position: Sitting, Cuff Size: Normal)   Pulse 98   Temp 97.6 F (36.4 C) (Temporal)   Ht '5\' 11"'$  (1.803 m)   Wt 145 lb (65.8 kg)   SpO2 99%   BMI 20.22 kg/m    Physical Exam Constitutional:      General: He is not in acute distress.    Appearance: Normal appearance. He is not ill-appearing, toxic-appearing or diaphoretic.  HENT:     Head: Normocephalic and atraumatic.     Right Ear: External ear normal.     Left Ear: External ear normal.     Mouth/Throat:     Pharynx: No posterior oropharyngeal erythema.  Eyes:     General: No scleral icterus.       Right eye: No discharge.        Left eye: No discharge.     Extraocular Movements: Extraocular movements intact.     Conjunctiva/sclera: Conjunctivae normal.  Cardiovascular:     Rate and Rhythm: Normal rate.  Pulmonary:     Effort: No respiratory distress.  Skin:    General: Skin is warm and dry.        Neurological:     Mental Status: He is alert and oriented to person, place, and time.  Psychiatric:        Mood and Affect: Mood normal.        Behavior: Behavior normal.    Suture removal note: Patient consented to procedure.  Placed in the supine position.  3 Vicryl sutures were removed.  Patient tolerated the procedure well.  Wound is well-healed.  There is no erythema or discharge. Nodes and there lness   Pe patient  wound problems or concerns.   Past Medical History:  Diagnosis Date   Arthritis    Atrial fibrillation ([place])    CKD (chronic kidney disease)    History of hiatal hernia    Hx SBO    Last 2013 all treated conservatively   Hypertension    Presence of permanent cardiac pacemaker    Prostate cancer [facility])    Stroke ([place]) 2017   left facial drooping noted 08/14/2016   Tachycardia-bradycardia syndrome ([place])    Type II diabetes mellitus ([place])    Ventral hernia    x3   Family History  Problem Relation Age of Onset   Pneumonia Father    Stroke Brother    Stroke Sister  Cancer Brother        type unknown   Current Outpatient Medications  Medication Sig Dispense Refill   docusate sodium (COLACE) 100 MG capsule Take 1 capsule (100 mg total) by mouth every other day.     ferrous sulfate (FEROSUL) 325 (65 FE) MG tablet Take 1 tablet (325 mg total) by mouth daily. 90 tablet 1   finasteride (PROSCAR) 5 MG tablet Take 1 tablet (5 mg total) by mouth daily. (Patient taking differently: Take 5 mg by mouth every evening.) 90 tablet 1   folic acid (FOLVITE) 1 MG tablet Take 1 tablet (1 mg total) by mouth daily. 90 tablet 3   latanoprost (XALATAN) 0.005 % ophthalmic solution Place 1 drop into both eyes at bedtime.     memantine (NAMENDA) 10 MG tablet Take 1 tablet (10 mg total) by mouth 2 (two) times daily. 180 tablet 3   metoprolol succinate (TOPROL-XL) 25 MG 24 hr tablet Take 1 tablet (25 mg total) by mouth daily. 30 tablet 0   Nutritional Supplements (GLUCERNA PO) Take 1  Bottle by mouth once a week.     pantoprazole (PROTONIX) 40 MG tablet TAKE 1 TABLET(40 MG) BY MOUTH DAILY 90 tablet 1   polyethylene glycol powder (GLYCOLAX/MIRALAX) 17 GM/SCOOP powder Take 17 g by mouth 2 (two) times daily as needed. 3350 g 1   Pseudoeph-Doxylamine-DM-APAP (NYQUIL PO) Take 1 Dose by mouth daily as needed (sleep).     tamsulosin (FLOMAX) 0.4 MG CAPS capsule Take 1 capsule (0.4 mg total) by mouth daily. 90 capsule 3   vitamin B-12 (CYANOCOBALAMIN) 500 MCG tablet Take 500 mcg by mouth daily.     XARELTO 15 MG TABS tablet TAKE 1 TABLET DAILY WITH SUPPER 90 tablet 1   divalproex (DEPAKOTE) 125 MG DR tablet Take 1 tablet every other day. 45 tablet 1   pioglitazone (ACTOS) 15 MG tablet Take 1 tablet (15 mg total) by mouth daily. 30 tablet 5   No current facility-administered medications for this visit.   No Known Allergies Social History   Socioeconomic History   Marital status: Widowed    Spouse name: Not on file   Number of children: Not on file   Years of education: Not on file   Highest education level: Not on file  Occupational History   Not on file  Tobacco Use   Smoking status: Former    Packs/day: 1.50    Years: 15.00    Total pack years: 22.50    Types: Cigarettes    Quit date: 04/16/1955    Years since quitting: 66.8    Passive exposure: Past   Smokeless tobacco: Former    Types: Chew    Quit date: 08/17/1952  Vaping Use   Vaping Use: Never used  Substance and Sexual Activity   Alcohol use: No    Comment: 08/14/2016 "drank beer when I was a teenager"   Drug use: No   Sexual activity: Yes  Other Topics Concern   Not on file  Social History Narrative   Lives alone   Social Determinants of Health   Financial Resource Strain: Not on file  Food Insecurity: Not on file  Transportation Needs: Not on file  Physical Activity: Not on file  Stress: Not on file  Social Connections: Not on file  Intimate Partner Violence: Not on file     Results for  orders placed or performed in visit on 02/14/22  [facility] [place]  Result Value Ref Range  Date Time Interrogation Session [phone]    Pulse Generator Manufacturer MERM    Pulse Gen Model [id number] Santina Evans SR MRI    Pulse Gen Serial Number [id number]    Clinic Name [facility]    Implantable Pulse Generator Type Implantable Pulse Generator    Implantable Pulse Generator Implant Date [id number]    Implantable Lead Manufacturer [facility]    Implantable Lead Model [id number] CapSureFix Novus    Implantable Lead Serial Number [id number]    Implantable Lead Implant Date [id number]    Implantable Lead Location Detail 1 APEX    Implantable Lead Location [id number]    Lead Channel Setting Sensing Sensitivity 1.2 mV   Lead Channel Setting Pacing Pulse Width 0.4 ms   Lead Channel Setting Pacing Amplitude 2.5 V   Lead Channel Impedance Value 361 ohm   Lead Channel Impedance Value 266 ohm   Lead Channel Sensing Intrinsic Amplitude 15 mV   Lead Channel Sensing Intrinsic Amplitude 13.625 mV   Lead Channel Pacing Threshold Amplitude 1 V   Lead Channel Pacing Threshold Pulse Width 0.4 ms   Battery Status OK    Battery Remaining Longevity 172 mo   Battery Voltage 3.10 V   Brady Statistic RV Percent Paced 16.24 %      The ASCVD Risk score (Arnett DK, et al., 2019) failed to calculate for the following reasons:   The 2019 ASCVD risk score is only valid for ages 21 to 35   The patient has a prior MI or stroke diagnosis    Assessment & Plan:   Problem List Items Addressed This Visit       Endocrine   Type 2 diabetes mellitus without complication, without long-term current use of insulin ([name])   Relevant Medications   pioglitazone (ACTOS) 15 MG tablet     Other   Agitation due to dementia ([place]) - Primary   Relevant Medications   divalproex (DEPAKOTE) 125 MG DR tablet   Urinary frequency   Relevant Orders   Urinalysis, Routine w reflex microscopic    Return stop  atorvastatin and jardiance. Start Actos for diabetes. Take depakote every other day..   Have discontinued atorvastatin and Jardiance.  We will start Actos instead.  Discontinuing the Jardiance should help with urinary frequency and other issues.  I have started giving him the Depakote every other day and I think that this is worth a try.  Checking UA for urinary frequency.  Sutures placed in ER were removed today. [name], MD

## 2022-02-15 ENCOUNTER — Other Ambulatory Visit: Payer: Self-pay

## 2022-02-15 DIAGNOSIS — F03911 Unspecified dementia, unspecified severity, with agitation: Secondary | ICD-10-CM

## 2022-02-15 MED ORDER — DIVALPROEX SODIUM 125 MG PO DR TAB: DELAYED_RELEASE_TABLET | ORAL | 1 refills | Status: DC

## 2022-02-18 ENCOUNTER — Other Ambulatory Visit (HOSPITAL_COMMUNITY): Payer: Self-pay | Admitting: *Deleted

## 2022-02-19 NOTE — Telephone Encounter (Signed)
FYI message below.

## 2022-02-20 ENCOUNTER — Encounter: Payer: Self-pay | Admitting: Neurology

## 2022-02-20 ENCOUNTER — Ambulatory Visit (INDEPENDENT_AMBULATORY_CARE_PROVIDER_SITE_OTHER): Payer: Medicare PPO | Admitting: Neurology

## 2022-02-20 VITALS — BP 106/62 | HR 62 | Ht 71.0 in | Wt 126.2 lb

## 2022-02-20 DIAGNOSIS — G309 Alzheimer's disease, unspecified: Secondary | ICD-10-CM | POA: Diagnosis not present

## 2022-02-20 DIAGNOSIS — R413 Other amnesia: Secondary | ICD-10-CM | POA: Diagnosis not present

## 2022-02-20 DIAGNOSIS — F0280 Dementia in other diseases classified elsewhere without behavioral disturbance: Secondary | ICD-10-CM | POA: Diagnosis not present

## 2022-02-20 DIAGNOSIS — R7983 Abnormal findings of blood amino-acid level: Secondary | ICD-10-CM | POA: Diagnosis not present

## 2022-02-20 NOTE — Progress Notes (Signed)
[facility] [address] [place]. [place] [address] [phone]       OFFICE CONSULT NOTE  [name] Date of Birth:  Jan 17, 1924 Medical Record Number:  [id number]   Referring MD: [name]  Reason for Referral: Memory loss  HPI: Initial visit 08/08/2021 [name] is a 88 year old African-American male seen today for office consultation visit for memory loss.  He is accompanied by his granddaughter and grandson.  History is obtained from them and review of electronic medical records and opossum reviewed pertinent available imaging films in PACS.  He has past medical history of atrial fibrillation, chronic kidney disease, hypertension, pacemaker, right MCA infarct in March 2017 from which she recovered quite well with no permanent deficits.  His daughter informs me that they have noticed cognitive decline gradually over the last year or so.  Recently was hospitalized with small bowel obstruction on 07/29/2021 and during hospitalization he had a lot of sundowning and agitated behavior and not following doctors orders.  He was felt to likely have dementia and advised to follow-up with me for further evaluation and treatment for the same.  The patient is stubborn is often refusing his medications.  He refused to follow instructions.  The family has noticed significant cognitive decline as well as some intermittent urinary and stool incontinence at times as well.  Granddaughter has had to move in and live with the patient and he was independent prior to this.  He remains on Xarelto for his chronic A-fib.  His blood pressure is well controlled today it is 115/73.  He is tolerating Lipitor without muscle aches and pains.  He has not had a formal evaluation for dementia and has not tried medications like Aricept or Namenda.  CT scan of the head on 05/24/2020 showed no acute abnormality and stable generalized cerebral atrophy and changes of small vessel disease.   Vitamin B12 level on 04/25/2021 was low normal at 260.  TSH was normal at 2.8 on 03/21/2020. Update 02/20/2022 : He returns for follow-up after last visit 6 months ago.  He is accompanied by his granddaughter and her husband as well as his Systems developer.  Patient is tolerating Namenda well without any side effects.  Family feels he is cognitively unchanged.  Continues to have short-term memory difficulties.  His gait and balance are poor he had a fall 2 weeks ago and he was seen in the ER fortunately had no fractures and CT scan of the head showed no acute abnormalities.  Patient has a Marine scientist.  He is able to walk with assistance using a cane with one-person right next to him.  Patient had lab work done at last visit which showed elevated homocystine levels of 32.1.  He was started on folic acid but no follow-up labs have been done.  Vitamin B12, TSH and RPR were all negative.  EEG was ordered but has not been done.  CT scan of the head without contrast on 02/05/2022 showed no acute abnormalities.  Patient's behavior has been quite calm.  He does not get agitated or violent.  He does not have significant hallucinations or delusions and can be easily redirected when he gets anxious or upset.  His family provides 24-hour care for him at home and did not like the care he was getting in a nursing home. ROS:   14 system review of systems is positive for memory loss, sundowning, agitation, confusion, disorientation stubborn behavior, incontinence occasionally of bowel and bladder all  other systems negative  PMH:  Past Medical History:  Diagnosis Date   Arthritis    Atrial fibrillation ([place])    CKD (chronic kidney disease)    History of hiatal hernia    Hx SBO    Last 2013 all treated conservatively   Hypertension    Presence of permanent cardiac pacemaker    Prostate cancer ([place])    Stroke ([place]) 2017   left facial drooping noted 08/14/2016   Tachycardia-bradycardia syndrome ([place])    Type II diabetes mellitus  ([place])    Ventral hernia    x3    Social History:  Social History   Socioeconomic History   Marital status: Widowed    Spouse name: Not on file   Number of children: Not on file   Years of education: Not on file   Highest education level: Not on file  Occupational History   Not on file  Tobacco Use   Smoking status: Former    Packs/day: 1.50    Years: 15.00    Total pack years: 22.50    Types: Cigarettes    Quit date: 04/16/1955    Years since quitting: 66.8    Passive exposure: Past   Smokeless tobacco: Former    Types: Chew    Quit date: 08/17/1952  Vaping Use   Vaping Use: Never used  Substance and Sexual Activity   Alcohol use: No    Comment: 08/14/2016 "drank beer when I was a teenager"   Drug use: No   Sexual activity: Yes  Other Topics Concern   Not on file  Social History Narrative   Lives alone   Social Determinants of Health   Financial Resource Strain: Not on file  Food Insecurity: Not on file  Transportation Needs: Not on file  Physical Activity: Not on file  Stress: Not on file  Social Connections: Not on file  Intimate Partner Violence: Not on file    Medications:   Current Outpatient Medications on File Prior to Visit  Medication Sig Dispense Refill   divalproex (DEPAKOTE) 125 MG DR tablet Take 1 tablet every other day. 45 tablet 1   docusate sodium (COLACE) 100 MG capsule Take 1 capsule (100 mg total) by mouth every other day.     ferrous sulfate (FEROSUL) 325 (65 FE) MG tablet Take 1 tablet (325 mg total) by mouth daily. 90 tablet 1   finasteride (PROSCAR) 5 MG tablet Take 1 tablet (5 mg total) by mouth daily. (Patient taking differently: Take 5 mg by mouth every evening.) 90 tablet 1   folic acid (FOLVITE) 1 MG tablet Take 1 tablet (1 mg total) by mouth daily. 90 tablet 3   latanoprost (XALATAN) 0.005 % ophthalmic solution Place 1 drop into both eyes at bedtime.     Melatonin 3 MG TBDP Take by mouth.     memantine (NAMENDA) 10 MG tablet Take  1 tablet (10 mg total) by mouth 2 (two) times daily. 180 tablet 3   metoprolol succinate (TOPROL-XL) 25 MG 24 hr tablet Take 1 tablet (25 mg total) by mouth daily. 30 tablet 0   Nutritional Supplements (GLUCERNA PO) Take 1 Bottle by mouth once a week.     pantoprazole (PROTONIX) 40 MG tablet TAKE 1 TABLET(40 MG) BY MOUTH DAILY 90 tablet 1   pioglitazone (ACTOS) 15 MG tablet Take 1 tablet (15 mg total) by mouth daily. 30 tablet 5   polyethylene glycol powder (GLYCOLAX/MIRALAX) 17 GM/SCOOP powder Take 17 g by mouth 2 (  two) times daily as needed. 3350 g 1   Pseudoeph-Doxylamine-DM-APAP (NYQUIL PO) Take 1 Dose by mouth daily as needed (sleep).     tamsulosin (FLOMAX) 0.4 MG CAPS capsule Take 1 capsule (0.4 mg total) by mouth daily. 90 capsule 3   vitamin B-12 (CYANOCOBALAMIN) 500 MCG tablet Take 500 mcg by mouth daily.     XARELTO 15 MG TABS tablet TAKE 1 TABLET DAILY WITH SUPPER 90 tablet 1   No current facility-administered medications on file prior to visit.    Allergies:  No Known Allergies  Physical Exam General: Frail elderly African-American male., seated, in no evident distress Head: head normocephalic and atraumatic.   Neck: supple with no carotid or supraclavicular bruits Cardiovascular: regular rate and rhythm, no murmurs Musculoskeletal: Mild kyphoscoliosis. Skin:  no rash/petichiae pacemaker left infraclavicular region. Vascular:  Normal pulses all extremities  Neurologic Exam Mental Status: Awake and fully alert. Oriented to place and time. Recent and remote memory intact. Attention span, concentration and fund of knowledge appropriate. Mood and affect appropriate.  Mini-Mental status exam scored 21/30 ( last visit 19/30 )with deficits in recall, repetition, writing and drawing.  Unable to copy intersecting pentagons.  Clock drawing 3/4.  Able to name 10 animals that can walk on 4 legs.  Palmomental reflex present bilaterally. Cranial Nerves: Fundoscopic exam not done. Pupils  equal, briskly reactive to light. Extraocular movements full without nystagmus. Visual fields full to confrontation. Hearing significantly diminished bilaterally.. Facial sensation intact. Face, tongue, palate moves normally and symmetrically.  Motor: Normal bulk and tone. Normal strength in all tested extremity muscles. Sensory.: intact to touch , pinprick , position and vibratory sensation.  Unsteady while standing on either foot unsupported and on a narrow base. Coordination: Rapid alternating movements normal in all extremities. Finger-to-nose and heel-to-shin performed accurately bilaterally. Gait and Station: Arises from chair without difficulty. Stance is slightly stooped.. Gait demonstrates normal stride length and balance . Able to heel, toe and tandem walk with mild difficulty.  Reflexes: 1+ and symmetric. Toes downgoing.          02/20/2022    2:20 PM 08/08/2021    9:20 AM  MMSE - Mini Mental State Exam  Orientation to time 5 4  Orientation to Place 5 5  Registration 3 3  Attention/ Calculation 0 0  Recall 0 0  Language- name 2 objects 2 2  Language- repeat 1 0  Language- follow 3 step command 3 3  Language- read & follow direction 1 1  Write a sentence 1 1  Copy design 0 0  Total score 21 19      ASSESSMENT: 88 year old African-American male with recent worsening cognition and memory loss and sundowning likely due to mild Alzheimer's dementia. History of right MCA infarct in March 2017 secondary to A-fib from which she recovered quite well with practically no lasting deficits.    PLAN:I had a long discussion with the patient and his granddaughter and her husband regarding his mild Alzheimer's dementia which was not stable on the current dosage of Namenda which is tolerating well.  He also has hyperhomocysteinemia for which she is taking folic acid checking repeat still elevated switching him to Cerefolin nac.  24-hour care at home which the family is providing.  Patient  encouraged to ambulate with a cane with one-person assist.  Return for follow-up in 6 months or call earlier if necessary. Greater than 50% time during this 45-minute visit was spent on counseling and coordination of care about his  memory loss and cognitive impairment and dementia and planning evaluation and treatment and answering questions  Antony Contras, MD Note: This document was prepared with digital dictation and possible smart phrase technology. Any transcriptional errors that result from this process are unintentional.

## 2022-02-20 NOTE — Patient Instructions (Addendum)
I had a long discussion with the patient and his granddaughter and her husband regarding his mild Alzheimer's dementia which appears stable on the current dosage of Namenda which is tolerating well.  He is not competent to handle his own finances due to his cognitive impairment.  He also has hyperhomocysteinemia for which she is taking folic acid checking repeat still elevated switching him to Cerefolin nac.  24-hour care at home which the family is providing.  Patient encouraged to ambulate with a cane with one-person assist.  Return for follow-up in 6 months or call earlier if necessary.

## 2022-02-21 ENCOUNTER — Encounter: Payer: Self-pay | Admitting: Family Medicine

## 2022-02-21 LAB — HOMOCYSTEINE: Homocysteine: 14.5 umol/L (ref 0.0–21.3)

## 2022-02-26 ENCOUNTER — Encounter: Payer: Self-pay | Admitting: Neurology

## 2022-02-26 MED ORDER — MEMANTINE HCL 10 MG PO TABS: 10.0000 mg | ORAL_TABLET | Freq: Two times a day (BID) | ORAL | 3 refills | Status: DC

## 2022-03-01 NOTE — Progress Notes (Signed)
Kindly call and inform the patient about follow-up homocystine results.  I sent a MyChart notification but patient has not reviewed it

## 2022-03-04 ENCOUNTER — Ambulatory Visit: Payer: BC Managed Care – PPO | Admitting: Family Medicine

## 2022-03-04 ENCOUNTER — Encounter: Payer: Self-pay | Admitting: Family Medicine

## 2022-03-09 ENCOUNTER — Emergency Department (HOSPITAL_BASED_OUTPATIENT_CLINIC_OR_DEPARTMENT_OTHER): Payer: Medicare PPO

## 2022-03-09 ENCOUNTER — Other Ambulatory Visit: Payer: Self-pay

## 2022-03-09 ENCOUNTER — Encounter (HOSPITAL_BASED_OUTPATIENT_CLINIC_OR_DEPARTMENT_OTHER): Payer: Self-pay | Admitting: Emergency Medicine

## 2022-03-09 ENCOUNTER — Emergency Department (HOSPITAL_BASED_OUTPATIENT_CLINIC_OR_DEPARTMENT_OTHER)
Admission: EM | Admit: 2022-03-09 | Discharge: 2022-03-10 | Disposition: A | Discharge status: Home / Self Care | Payer: Medicare PPO | Attending: Emergency Medicine | Admitting: Emergency Medicine

## 2022-03-09 DIAGNOSIS — Z8546 Personal history of malignant neoplasm of prostate: Secondary | ICD-10-CM | POA: Insufficient documentation

## 2022-03-09 DIAGNOSIS — E1122 Type 2 diabetes mellitus with diabetic chronic kidney disease: Secondary | ICD-10-CM | POA: Insufficient documentation

## 2022-03-09 DIAGNOSIS — J90 Pleural effusion, not elsewhere classified: Secondary | ICD-10-CM | POA: Diagnosis not present

## 2022-03-09 DIAGNOSIS — N189 Chronic kidney disease, unspecified: Secondary | ICD-10-CM | POA: Insufficient documentation

## 2022-03-09 DIAGNOSIS — R600 Localized edema: Secondary | ICD-10-CM

## 2022-03-09 DIAGNOSIS — R609 Edema, unspecified: Secondary | ICD-10-CM

## 2022-03-09 DIAGNOSIS — N5089 Other specified disorders of the male genital organs: Secondary | ICD-10-CM | POA: Diagnosis not present

## 2022-03-09 DIAGNOSIS — Z7984 Long term (current) use of oral hypoglycemic drugs: Secondary | ICD-10-CM | POA: Insufficient documentation

## 2022-03-09 DIAGNOSIS — Z7901 Long term (current) use of anticoagulants: Secondary | ICD-10-CM | POA: Diagnosis not present

## 2022-03-09 DIAGNOSIS — I129 Hypertensive chronic kidney disease with stage 1 through stage 4 chronic kidney disease, or unspecified chronic kidney disease: Secondary | ICD-10-CM | POA: Diagnosis not present

## 2022-03-09 DIAGNOSIS — Z950 Presence of cardiac pacemaker: Secondary | ICD-10-CM | POA: Diagnosis not present

## 2022-03-09 DIAGNOSIS — R079 Chest pain, unspecified: Secondary | ICD-10-CM | POA: Diagnosis not present

## 2022-03-09 DIAGNOSIS — Z79899 Other long term (current) drug therapy: Secondary | ICD-10-CM | POA: Insufficient documentation

## 2022-03-09 LAB — BASIC METABOLIC PANEL
Anion gap: 10 (ref 5–15)
BUN: 26 mg/dL — ABNORMAL HIGH (ref 8–23)
CO2: 18 mmol/L — ABNORMAL LOW (ref 22–32)
Calcium: 8.2 mg/dL — ABNORMAL LOW (ref 8.9–10.3)
Chloride: 111 mmol/L (ref 98–111)
Creatinine, Ser: 1.84 mg/dL — ABNORMAL HIGH (ref 0.61–1.24)
GFR, Estimated: 33 mL/min — ABNORMAL LOW (ref >60)
Glucose, Bld: 150 mg/dL — ABNORMAL HIGH (ref 70–99)
Potassium: 5 mmol/L (ref 3.5–5.1)
Sodium: 139 mmol/L (ref 135–145)

## 2022-03-09 LAB — CBC
HCT: 29.5 % — ABNORMAL LOW (ref 39.0–52.0)
Hemoglobin: 9.7 g/dL — ABNORMAL LOW (ref 13.0–17.0)
MCH: 25.3 pg — ABNORMAL LOW (ref 26.0–34.0)
MCHC: 32.9 g/dL (ref 30.0–36.0)
MCV: 76.8 fL — ABNORMAL LOW (ref 80.0–100.0)
Platelets: 156 10*3/uL (ref 150–400)
RBC: 3.84 MIL/uL — ABNORMAL LOW (ref 4.22–5.81)
RDW: 17.9 % — ABNORMAL HIGH (ref 11.5–15.5)
WBC: 7.4 10*3/uL (ref 4.0–10.5)
nRBC: 0 % (ref 0.0–0.2)

## 2022-03-09 MED ORDER — FUROSEMIDE 10 MG/ML IJ SOLN
20.0000 mg | Freq: Once | INTRAMUSCULAR | Status: AC
  Administered 2022-03-09: 20 mg via INTRAMUSCULAR
  Filled 2022-03-09: qty 2

## 2022-03-09 NOTE — ED Triage Notes (Signed)
Swelling to hand, feet, face and scrotum.  Noticed yesterday. Denies sob, chest pain  Given lasix by daughter with some improvement.  Some confusion noted, by daughter. Able to answer correctly in triage

## 2022-03-10 MED ORDER — FUROSEMIDE 20 MG PO TABS: 20.0000 mg | ORAL_TABLET | Freq: Every day | ORAL | 0 refills | Status: DC

## 2022-03-10 NOTE — ED Provider Notes (Signed)
[facility] EMERGENCY DEPT Provider Note   CSN: [id number] Arrival date & time: 03/09/22  2007     History  Chief Complaint  Patient presents with   Edema    [name] is a 88 y.o. male.  HPI     This is a 88 year old male who presents with his family with concerns for swelling.  They state that over the last several days they have noted increased swelling of the hands, face, feet, and scrotum.  Of note, approximately 3 weeks ago he was taken off his daily Lasix secondary to dehydration by his primary physician.  He does have a history of CHF.  Patient is without complaint.  He denies shortness of breath or chest pain.  Daughter states that she did give him 1 dose of Lasix yesterday morning and the swelling did seem to improve.  Home Medications Prior to Admission medications   Medication Sig Start Date End Date Taking? Authorizing Provider  furosemide (LASIX) 20 MG tablet Take 1 tablet (20 mg total) by mouth daily. 03/10/22  Yes Keyshia Orwick, Barbette Hair, MD  divalproex (DEPAKOTE) 125 MG DR tablet Take 1 tablet every other day. 02/15/22   [name], MD  docusate sodium (COLACE) 100 MG capsule Take 1 capsule (100 mg total) by mouth every other day. 01/01/22   Geradine Girt, DO  ferrous sulfate (FEROSUL) 325 (65 FE) MG tablet Take 1 tablet (325 mg total) by mouth daily. 02/07/22   [name], MD  finasteride (PROSCAR) 5 MG tablet Take 1 tablet (5 mg total) by mouth daily. Patient taking differently: Take 5 mg by mouth every evening. 01/15/22   [name], MD  folic acid (FOLVITE) 1 MG tablet Take 1 tablet (1 mg total) by mouth daily. 11/30/21 11/30/22  [name], MD  latanoprost (XALATAN) 0.005 % ophthalmic solution Place 1 drop into both eyes at bedtime. 05/03/20   [provider]  Melatonin 3 MG TBDP Take by mouth.    [provider]  memantine (NAMENDA) 10 MG tablet Take 1 tablet (10 mg total) by mouth  2 (two) times daily. 02/26/22   Garvin Fila, MD  metoprolol succinate (TOPROL-XL) 25 MG 24 hr tablet Take 1 tablet (25 mg total) by mouth daily. 01/02/22   Geradine Girt, DO  Nutritional Supplements (GLUCERNA PO) Take 1 Bottle by mouth once a week.    [provider]  pantoprazole (PROTONIX) 40 MG tablet TAKE 1 TABLET(40 MG) BY MOUTH DAILY 01/01/22   [name], MD  pioglitazone (ACTOS) 15 MG tablet Take 1 tablet (15 mg total) by mouth daily. 02/14/22   [name], MD  polyethylene glycol powder [facility]) 17 GM/SCOOP powder Take 17 g by mouth 2 (two) times daily as needed. 08/03/21   McElwee, Lauren A, NP  Pseudoeph-Doxylamine-DM-APAP (NYQUIL PO) Take 1 Dose by mouth daily as needed (sleep).    [provider]  tamsulosin (FLOMAX) 0.4 MG CAPS capsule Take 1 capsule (0.4 mg total) by mouth daily. 12/04/21   [name], MD  vitamin B-12 (CYANOCOBALAMIN) 500 MCG tablet Take 500 mcg by mouth daily.    [provider]  XARELTO 15 MG TABS tablet TAKE 1 TABLET DAILY WITH SUPPER 01/22/22   [name], MD      Allergies    Patient has no known allergies.    Review of Systems   Review of Systems  Constitutional:  Negative for fever.  Respiratory:  Negative for shortness of breath.   Cardiovascular:  Positive for leg swelling. Negative for chest pain.  All other systems reviewed and are negative.   Physical Exam Updated Vital Signs BP 118/79   Pulse 87   Temp 98.7 F (37.1 C)   Resp 20   SpO2 97%  Physical Exam Vitals and nursing note reviewed.  Constitutional:      Appearance: He is well-developed.     Comments: Elderly, chronically ill-appearing but nontoxic  HENT:     Head: Normocephalic and atraumatic.     Mouth/Throat:     Mouth: Mucous membranes are moist.  Eyes:     Pupils: Pupils are equal, round, and reactive to light.  Cardiovascular:     Rate and Rhythm: Normal rate and regular rhythm.      Heart sounds: Normal heart sounds. No murmur heard. Pulmonary:     Effort: Pulmonary effort is normal. No respiratory distress.     Breath sounds: Normal breath sounds. No wheezing.  Abdominal:     Palpations: Abdomen is soft.     Tenderness: There is no abdominal tenderness. There is no rebound.     Hernia: A hernia is present.     Comments: Multiple ventral hernias noted, reducible  Genitourinary:    Comments: Slight scrotal swelling with edema of the foreskin of the penis, retractable Musculoskeletal:     Cervical back: Neck supple.     Right lower leg: Edema present.     Left lower leg: Edema present.     Comments: 1+ bilateral lower extremity edema  Lymphadenopathy:     Cervical: No cervical adenopathy.  Skin:    General: Skin is warm and dry.  Neurological:     Mental Status: He is alert and oriented to person, place, and time.  Psychiatric:        Mood and Affect: Mood normal.     ED Results / Procedures / Treatments   Labs (all labs ordered are listed, but only abnormal results are displayed) Labs Reviewed  BASIC METABOLIC PANEL - Abnormal; Notable for the following components:      Result Value   CO2 18 (*)    Glucose, Bld 150 (*)    BUN 26 (*)    Creatinine, Ser 1.84 (*)    Calcium 8.2 (*)    GFR, Estimated 33 (*)    All other components within normal limits  CBC - Abnormal; Notable for the following components:   RBC 3.84 (*)    Hemoglobin 9.7 (*)    HCT 29.5 (*)    MCV 76.8 (*)    MCH 25.3 (*)    RDW 17.9 (*)    All other components within normal limits    EKG EKG Interpretation  Date/Time:  Saturday March 09 2022 20:24:44 EDT Ventricular Rate:  82 PR Interval:    QRS Duration: 88 QT Interval:  386 QTC Calculation: 450 R Axis:   -3 Text Interpretation: Atrial fibrillation with occasional ventricular-paced complexes Low voltage QRS Septal infarct , age undetermined ST & T wave abnormality, consider inferolateral ischemia Abnormal ECG When  compared with ECG of 10-Feb-2022 20:10, PREVIOUS ECG IS PRESENT No significant change since last tracing Confirmed by Thayer Jew ([id number]) on 03/09/2022 11:17:35 PM  Radiology DG Chest Port 1 View  Result Date: 03/09/2022 CLINICAL DATA:  Chest pain, anasarca EXAM: PORTABLE CHEST 1 VIEW COMPARISON:  02/10/2022 FINDINGS: Small bilateral pleural effusions have developed since prior examination. Lungs are clear. No pneumothorax. Cardiac  size is mildly enlarged, unchanged. Left subclavian dual lead pacemaker is stable. Pulmonary vascularity is normal. No acute bone abnormality. IMPRESSION: Interval development of small bilateral pleural effusions. Electronically Signed   By: Fidela Salisbury M.D.   On: 03/09/2022 23:07    Procedures Procedures    Medications Ordered in ED Medications  furosemide (LASIX) injection 20 mg (20 mg Intramuscular Given 03/09/22 2326)    ED Course/ Medical Decision Making/ A&P                           Medical Decision Making Amount and/or Complexity of Data Reviewed Labs: ordered.  Risk Prescription drug management.   This patient presents to the ED for concern of peripheral edema, this involves an extensive number of treatment options, and is a complaint that carries with it a high risk of complications and morbidity.  I considered the following differential and admission for this acute, potentially life threatening condition.  The differential diagnosis includes CHF, volume overload, fluid shifts secondary to dietary indiscretion, third spacing  MDM:    This is a 88 year old male who presents with peripheral edema.  He is nontoxic and vital signs are largely reassuring.  He is currently without complaint.  He is in no respiratory distress.  He does appear slightly volume overloaded.  Daughter reports that swelling seems improved after having 1 dose of Lasix.  He was recently taken off of his Lasix secondary to concerns for dehydration.  Labs obtained and  reviewed.  Creatinine has improved slightly.  Chest x-ray shows small bilateral pleural effusions.  Clinically he is in no respiratory distress and satting 97%.  Suspect he may be in slight CHF.  He was given 1 dose of IM Lasix.  Recommend restarting Lasix 20 mg daily for the next 3 days and recheck with primary physician.  He may need a low-dose of Lasix to maintain fluid balance.  (Labs, imaging, consults)  Labs: I Ordered, and personally interpreted labs.  The pertinent results include: CBC, BMP  Imaging Studies ordered: I ordered imaging studies including chest x-ray I independently visualized and interpreted imaging. I agree with the radiologist interpretation  Additional history obtained from chart review.  External records from outside source obtained and reviewed including prior evaluations  Cardiac Monitoring: The patient was maintained on a cardiac monitor.  I personally viewed and interpreted the cardiac monitored which showed an underlying rhythm of: Normal sinus rhythm  Reevaluation: After the interventions noted above, I reevaluated the patient and found that they have :stayed the same  Social Determinants of Health: Elderly, lives with family  Disposition: Discharge  Co morbidities that complicate the patient evaluation  Past Medical History:  Diagnosis Date   Arthritis    Atrial fibrillation ([place])    CKD (chronic kidney disease)    History of hiatal hernia    Hx SBO    Last 2013 all treated conservatively   Hypertension    Presence of permanent cardiac pacemaker    Prostate cancer ([place])    Stroke ([place]) 2017   left facial drooping noted 08/14/2016   Tachycardia-bradycardia syndrome ([place])    Type II diabetes mellitus ([place])    Ventral hernia    x3     Medicines Meds ordered this encounter  Medications   furosemide (LASIX) injection 20 mg   furosemide (LASIX) 20 MG tablet    Sig: Take 1 tablet (20 mg total) by mouth daily.    Dispense:  30 tablet     Refill:  0    I have reviewed the patients home medicines and have made adjustments as needed  Problem List / ED Course: Problem List Items Addressed This Visit   None Visit Diagnoses     Peripheral edema    -  Primary                   Final Clinical Impression(s) / ED Diagnoses Final diagnoses:  Peripheral edema    Rx / DC Orders ED Discharge Orders          Ordered    furosemide (LASIX) 20 MG tablet  Daily        03/10/22 0008              [name], MD 03/10/22 [phone]

## 2022-03-10 NOTE — Discharge Instructions (Signed)
You were seen today with concerns for swelling.  Restart your furosemide daily.  Take 20 mg daily for the next 3 days.  Follow-up closely with your primary doctor at the beginning the week to assess hydration status and renal function.  You may need to continue to be on a low-dose of furosemide daily.

## 2022-03-18 ENCOUNTER — Telehealth: Payer: Self-pay

## 2022-03-18 NOTE — Telephone Encounter (Signed)
     Patient  visit on 10/22  at [facility] you been able to follow up with your primary care physician? yes  The patient was or was not able to obtain any needed medicine or equipment. yes  Are there diet recommendations that you are having difficulty following? na  Patient expresses understanding of discharge instructions and education provided has no other needs at this time. yes     [facility], Care Management  [phone] [address], [place], [place] [address] Phone: [phone] Email: [email or web address]

## 2022-04-01 DIAGNOSIS — H401124 Primary open-angle glaucoma, left eye, indeterminate stage: Secondary | ICD-10-CM | POA: Diagnosis not present

## 2022-04-16 ENCOUNTER — Other Ambulatory Visit: Payer: Self-pay | Admitting: Physician Assistant

## 2022-05-14 ENCOUNTER — Ambulatory Visit (INDEPENDENT_AMBULATORY_CARE_PROVIDER_SITE_OTHER): Payer: Self-pay

## 2022-05-14 VITALS — BP 110/76 | Ht 70.0 in | Wt 150.0 lb

## 2022-05-14 DIAGNOSIS — Z0000 Encounter for general adult medical examination without abnormal findings: Secondary | ICD-10-CM

## 2022-05-14 NOTE — Patient Instructions (Signed)
[name] , Thank you for taking time to come for your Medicare Wellness Visit. I appreciate your ongoing commitment to your health goals. Please review the following plan we discussed and let me know if I can assist you in the future.   These are the goals we discussed:  Goals      Patient Stated     05/14/2022, drink more water        This is a list of the screening recommended for you and due dates:  Health Maintenance  Topic Date Due   Eye exam for diabetics  10/18/2021   Complete foot exam   10/30/2021   Hemoglobin A1C  06/02/2022   Medicare Annual Wellness Visit  05/15/2023   DTaP/Tdap/Td vaccine (2 - Td or Tdap) 05/13/2030   Flu Shot  Completed   HPV Vaccine  Aged Out   Pneumonia Vaccine  Discontinued   COVID-19 Vaccine  Discontinued   Zoster (Shingles) Vaccine  Discontinued    Advanced directives: copy in chart  Conditions/risks identified: none  Next appointment: Follow up in one year for your annual wellness visit.   Preventive Care 86 Years and Older, Male  Preventive care refers to lifestyle choices and visits with your health care provider that can promote health and wellness. What does preventive care include? A yearly physical exam. This is also called an annual well check. Dental exams once or twice a year. Routine eye exams. Ask your health care provider how often you should have your eyes checked. Personal lifestyle choices, including: Daily care of your teeth and gums. Regular physical activity. Eating a healthy diet. Avoiding tobacco and drug use. Limiting alcohol use. Practicing safe sex. Taking low doses of aspirin every day. Taking vitamin and mineral supplements as recommended by your health care provider. What happens during an annual well check? The services and screenings done by your health care provider during your annual well check will depend on your age, overall health, lifestyle risk factors, and family history of  disease. Counseling  Your health care provider may ask you questions about your: Alcohol use. Tobacco use. Drug use. Emotional well-being. Home and relationship well-being. Sexual activity. Eating habits. History of falls. Memory and ability to understand (cognition). Work and work Statistician. Screening  You may have the following tests or measurements: Height, weight, and BMI. Blood pressure. Lipid and cholesterol levels. These may be checked every 5 years, or more frequently if you are over 37 years old. Skin check. Lung cancer screening. You may have this screening every year starting at age 45 if you have a 30-pack-year history of smoking and currently smoke or have quit within the past 15 years. Fecal occult blood test (FOBT) of the stool. You may have this test every year starting at age 65. Flexible sigmoidoscopy or colonoscopy. You may have a sigmoidoscopy every 5 years or a colonoscopy every 10 years starting at age 42. Prostate cancer screening. Recommendations will vary depending on your family history and other risks. Hepatitis C blood test. Hepatitis B blood test. Sexually transmitted disease (STD) testing. Diabetes screening. This is done by checking your blood sugar (glucose) after you have not eaten for a while (fasting). You may have this done every 1-3 years. Abdominal aortic aneurysm (AAA) screening. You may need this if you are a current or former smoker. Osteoporosis. You may be screened starting at age 37 if you are at high risk. Talk with your health care provider about your test results, treatment options,  and if necessary, the need for more tests. Vaccines  Your health care provider may recommend certain vaccines, such as: Influenza vaccine. This is recommended every year. Tetanus, diphtheria, and acellular pertussis (Tdap, Td) vaccine. You may need a Td booster every 10 years. Zoster vaccine. You may need this after age 54. Pneumococcal 13-valent  conjugate (PCV13) vaccine. One dose is recommended after age 58. Pneumococcal polysaccharide (PPSV23) vaccine. One dose is recommended after age 8. Talk to your health care provider about which screenings and vaccines you need and how often you need them. This information is not intended to replace advice given to you by your health care provider. Make sure you discuss any questions you have with your health care provider. Document Released: 06/02/2015 Document Revised: 01/24/2016 Document Reviewed: 03/07/2015 Elsevier Interactive Patient Education  2017 [place] Prevention in the Home Falls can cause injuries. They can happen to people of all ages. There are many things you can do to make your home safe and to help prevent falls. What can I do on the outside of my home? Regularly fix the edges of walkways and driveways and fix any cracks. Remove anything that might make you trip as you walk through a door, such as a raised step or threshold. Trim any bushes or trees on the path to your home. Use bright outdoor lighting. Clear any walking paths of anything that might make someone trip, such as rocks or tools. Regularly check to see if handrails are loose or broken. Make sure that both sides of any steps have handrails. Any raised decks and porches should have guardrails on the edges. Have any leaves, snow, or ice cleared regularly. Use sand or salt on walking paths during winter. Clean up any spills in your garage right away. This includes oil or grease spills. What can I do in the bathroom? Use night lights. Install grab bars by the toilet and in the tub and shower. Do not use towel bars as grab bars. Use non-skid mats or decals in the tub or shower. If you need to sit down in the shower, use a plastic, non-slip stool. Keep the floor dry. Clean up any water that spills on the floor as soon as it happens. Remove soap buildup in the tub or shower regularly. Attach bath mats  securely with double-sided non-slip rug tape. Do not have throw rugs and other things on the floor that can make you trip. What can I do in the bedroom? Use night lights. Make sure that you have a light by your bed that is easy to reach. Do not use any sheets or blankets that are too big for your bed. They should not hang down onto the floor. Have a firm chair that has side arms. You can use this for support while you get dressed. Do not have throw rugs and other things on the floor that can make you trip. What can I do in the kitchen? Clean up any spills right away. Avoid walking on wet floors. Keep items that you use a lot in easy-to-reach places. If you need to reach something above you, use a strong step stool that has a grab bar. Keep electrical cords out of the way. Do not use floor polish or wax that makes floors slippery. If you must use wax, use non-skid floor wax. Do not have throw rugs and other things on the floor that can make you trip. What can I do with my stairs? Do not leave  any items on the stairs. Make sure that there are handrails on both sides of the stairs and use them. Fix handrails that are broken or loose. Make sure that handrails are as long as the stairways. Check any carpeting to make sure that it is firmly attached to the stairs. Fix any carpet that is loose or worn. Avoid having throw rugs at the top or bottom of the stairs. If you do have throw rugs, attach them to the floor with carpet tape. Make sure that you have a light switch at the top of the stairs and the bottom of the stairs. If you do not have them, ask someone to add them for you. What else can I do to help prevent falls? Wear shoes that: Do not have high heels. Have rubber bottoms. Are comfortable and fit you well. Are closed at the toe. Do not wear sandals. If you use a stepladder: Make sure that it is fully opened. Do not climb a closed stepladder. Make sure that both sides of the stepladder  are locked into place. Ask someone to hold it for you, if possible. Clearly mark and make sure that you can see: Any grab bars or handrails. First and last steps. Where the edge of each step is. Use tools that help you move around (mobility aids) if they are needed. These include: Canes. Walkers. Scooters. Crutches. Turn on the lights when you go into a dark area. Replace any light bulbs as soon as they burn out. Set up your furniture so you have a clear path. Avoid moving your furniture around. If any of your floors are uneven, fix them. If there are any pets around you, be aware of where they are. Review your medicines with your doctor. Some medicines can make you feel dizzy. This can increase your chance of falling. Ask your doctor what other things that you can do to help prevent falls. This information is not intended to replace advice given to you by your health care provider. Make sure you discuss any questions you have with your health care provider. Document Released: 03/02/2009 Document Revised: 10/12/2015 Document Reviewed: 06/10/2014 Elsevier Interactive Patient Education  2017 [employer].

## 2022-05-14 NOTE — Progress Notes (Signed)
I connected with Warnell Bureau today by telephone and verified that I am speaking with the correct person using two identifiers. Location patient: home Location provider: work Persons participating in the virtual visit: Warnell Bureau, Edrick Kins (granddaughter), Glenna Durand LPN.   I discussed the limitations, risks, security and privacy concerns of performing an evaluation and management service by telephone and the availability of in person appointments. I also discussed with the patient that there may be a patient responsible charge related to this service. The patient expressed understanding and verbally consented to this telephonic visit.    Interactive audio and video telecommunications were attempted between this provider and patient, however failed, due to patient having technical difficulties OR patient did not have access to video capability.  We continued and completed visit with audio only.     Vital signs may be patient reported or missing.  Subjective:   DEMPSY DAMIANO is a 88 y.o. male who presents for an Initial Medicare Annual Wellness Visit.  Review of Systems     Cardiac Risk Factors include: advanced age (>55mn, >>49women);diabetes mellitus;male gender     Objective:    Today's Vitals   05/14/22 1331  BP: 110/76  Weight: 150 lb (68 kg)  Height: '5\' 10"'$  (1.778 m)   Body mass index is 21.52 kg/m.     05/14/2022    1:42 PM 03/09/2022    8:18 PM 02/10/2022    6:30 PM 02/05/2022   10:52 AM 02/04/2022   12:05 PM 01/19/2022    7:43 PM 12/05/2021    1:42 PM  Advanced Directives  Does Patient Have a Medical Advance Directive? Yes No Yes Yes Yes No Yes  Type of AParamedicof AAgraLiving will  HSunny SlopesLiving will HParkerLiving will HCreighton Does patient want to make changes to medical advance directive?     No - Patient declined     Copy of [place] Chart? Yes - validated most recent copy scanned in chart (See row information)   No - copy requested   No - copy requested  Would patient like information on creating a medical advance directive?    No - Patient declined  No - Patient declined No - Patient declined    Current Medications (verified) Outpatient Encounter Medications as of 05/14/2022  Medication Sig   divalproex (DEPAKOTE) 125 MG DR tablet Take 1 tablet every other day. (Patient taking differently: 2 (two) times daily.)   docusate sodium (COLACE) 100 MG capsule Take 1 capsule (100 mg total) by mouth every other day.   ferrous sulfate (FEROSUL) 325 (65 FE) MG tablet Take 1 tablet (325 mg total) by mouth daily.   finasteride (PROSCAR) 5 MG tablet Take 1 tablet (5 mg total) by mouth daily. (Patient taking differently: Take 5 mg by mouth every evening.)   folic acid (FOLVITE) 1 MG tablet Take 1 tablet (1 mg total) by mouth daily.   furosemide (LASIX) 20 MG tablet Take 1 tablet (20 mg total) by mouth daily.   latanoprost (XALATAN) 0.005 % ophthalmic solution Place 1 drop into both eyes at bedtime.   Melatonin 3 MG TBDP Take by mouth.   memantine (NAMENDA) 10 MG tablet Take 1 tablet (10 mg total) by mouth 2 (two) times daily.   metoprolol succinate (TOPROL-XL) 25 MG 24 hr tablet Take 1 tablet (25 mg total) by mouth daily.  Nutritional Supplements (GLUCERNA PO) Take 1 Bottle by mouth once a week.   pantoprazole (PROTONIX) 40 MG tablet TAKE 1 TABLET(40 MG) BY MOUTH DAILY   polyethylene glycol powder (GLYCOLAX/MIRALAX) 17 GM/SCOOP powder Take 17 g by mouth 2 (two) times daily as needed.   tamsulosin (FLOMAX) 0.4 MG CAPS capsule Take 1 capsule (0.4 mg total) by mouth daily.   vitamin B-12 (CYANOCOBALAMIN) 500 MCG tablet Take 500 mcg by mouth daily.   XARELTO 15 MG TABS tablet TAKE 1 TABLET DAILY WITH SUPPER   pioglitazone (ACTOS) 15 MG tablet Take 1 tablet (15 mg total) by mouth daily. (Patient  not taking: Reported on 05/14/2022)   Pseudoeph-Doxylamine-DM-APAP (NYQUIL PO) Take 1 Dose by mouth daily as needed (sleep). (Patient not taking: Reported on 05/14/2022)   No facility-administered encounter medications on file as of 05/14/2022.    Allergies (verified) Patient has no known allergies.   History: Past Medical History:  Diagnosis Date   Arthritis    Atrial fibrillation ([place])    CKD (chronic kidney disease)    History of hiatal hernia    Hx SBO    Last 2013 all treated conservatively   Hypertension    Presence of permanent cardiac pacemaker    Prostate cancer [facility])    Stroke ([place]) 2017   left facial drooping noted 08/14/2016   Tachycardia-bradycardia syndrome ([place])    Type II diabetes mellitus ([place])    Ventral hernia    x3   Past Surgical History:  Procedure Laterality Date   ABDOMINAL AORTOGRAM W/LOWER EXTREMITY N/A 08/07/2016   Procedure: Abdominal Aortogram w/Lower Extremity;  Surgeon: Wellington Hampshire, MD;  Location: [place] [facility];  Service: Cardiovascular;  Laterality: N/A;   CHOLECYSTECTOMY OPEN  03/31/2001   Dr Lindon Romp   COLON SURGERY     EXPLORATORY LAPAROTOMY  08/2004   Archie Endo 10/01/2010   HEMORRHOID SURGERY     HERNIA REPAIR     HIATAL HERNIA REPAIR  2002   INCISIONAL HERNIA REPAIR  08/2004   Archie Endo 10/01/2010   INSERT / REPLACE / REMOVE PACEMAKER     PERIPHERAL VASCULAR BALLOON ANGIOPLASTY  08/07/2016   Procedure: Peripheral Vascular Balloon Angioplasty;  Surgeon: Wellington Hampshire, MD;  Location: [place] [facility];  Service: Cardiovascular;;  left popliteal artery   PERIPHERAL VASCULAR INTERVENTION  08/14/2016   popliteal artery/notes 08/14/2016   PERIPHERAL VASCULAR INTERVENTION Left 08/14/2016   Procedure: Peripheral Vascular Intervention;  Surgeon: Wellington Hampshire, MD;  Location: [place] [facility];  Service: Cardiovascular;  Laterality: Left;  popliteal artery   PERMANENT PACEMAKER GENERATOR CHANGE N/A 04/06/2012   Procedure: PERMANENT  PACEMAKER GENERATOR CHANGE;  Surgeon: Evans Lance, MD;  Location[facility];  Service: Cardiovascular;  Laterality: N/A;   PPM GENERATOR CHANGEOUT N/A 02/04/2022   Procedure: PPM GENERATOR CHANGEOUT;  Surgeon: Evans Lance, MD;  Location: [place] [facility];  Service: Cardiovascular;  Laterality: N/A;   SMALL INTESTINE SURGERY  09/16/2004   SBO resection, VH repair   Family History  Problem Relation Age of Onset   Pneumonia Father    Stroke Brother    Stroke Sister    Cancer Brother        type unknown   Social History   Socioeconomic History   Marital status: Widowed    Spouse name: Not on file   Number of children: Not on file   Years of education: Not on file   Highest education level: Not on file  Occupational History   Not on file  Tobacco Use   Smoking status: Former    Packs/day: 1.50    Years: 15.00    Total pack years: 22.50    Types: Cigarettes    Quit date: 04/16/1955    Years since quitting: 67.1    Passive exposure: Past   Smokeless tobacco: Former    Types: Chew    Quit date: 08/17/1952  Vaping Use   Vaping Use: Never used  Substance and Sexual Activity   Alcohol use: No    Comment: 08/14/2016 "drank beer when I was a teenager"   Drug use: No   Sexual activity: Not Currently  Other Topics Concern   Not on file  Social History Narrative   Lives alone   Social Determinants of Health   Financial Resource Strain: Low Risk  (05/14/2022)   Overall Financial Resource Strain (CARDIA)    Difficulty of Paying Living Expenses: Not hard at all  Food Insecurity: No Food Insecurity (05/14/2022)   Hunger Vital Sign    Worried About Running Out of Food in the Last Year: Never true    Ran Out of Food in the Last Year: Never true  Transportation Needs: No Transportation Needs (05/14/2022)   PRAPARE - Hydrologist (Medical): No    Lack of Transportation (Non-Medical): No  Physical Activity: Insufficiently Active (05/14/2022)    Exercise Vital Sign    Days of Exercise per Week: 7 days    Minutes of Exercise per Session: 20 min  Stress: No Stress Concern Present (05/14/2022)   [place]    Feeling of Stress : Not at all  Social Connections: Not on file    Tobacco Counseling Counseling given: Not Answered   Clinical Intake:  Pre-visit preparation completed: Yes  Pain : No/denies pain     Nutritional Status: BMI of 19-24  Normal Nutritional Risks: None Diabetes: Yes  How often do you need to have someone help you when you read instructions, pamphlets, or other written materials from your doctor or pharmacy?: 5 - Always  Diabetic? Yes Nutrition Risk Assessment:  Has the patient had any N/V/D within the last 2 months?  No  Does the patient have any non-healing wounds?  No  Has the patient had any unintentional weight loss or weight gain?  Yes   Diabetes:  Is the patient diabetic?  Yes  If diabetic, was a CBG obtained today?  No  Did the patient bring in their glucometer from home?  No  How often do you monitor your CBG's? daily.   Financial Strains and Diabetes Management:  Are you having any financial strains with the device, your supplies or your medication? No .  Does the patient want to be seen by Chronic Care Management for management of their diabetes?  No  Would the patient like to be referred to a Nutritionist or for Diabetic Management?  No   Diabetic Exams:  Diabetic Eye Exam: Overdue for diabetic eye exam. Pt has been advised about the importance in completing this exam. Patient advised to call and schedule an eye exam. Diabetic Foot Exam: Overdue, Pt has been advised about the importance in completing this exam. Pt is scheduled for diabetic foot exam on next appointment.   Interpreter Needed?: No  Information entered by :: NAllen LPN   Activities of Daily Living    05/14/2022    1:44 PM 07/29/2021  3:00 PM   In your present state of health, do you have any difficulty performing the following activities:  Hearing? 1 1  Vision? 1 0  Comment glaucoma   Difficulty concentrating or making decisions? 1 1  Walking or climbing stairs? 1 1  Dressing or bathing? 1 0  Doing errands, shopping? 1 0  Preparing Food and eating ? Y   Using the Toilet? N   In the past six months, have you accidently leaked urine? Y   Do you have problems with loss of bowel control? Y   Managing your Medications? Y   Managing your Finances? Y   Housekeeping or managing your Housekeeping? Y     Patient Care Team: [name], MD as PCP - General (Family Medicine) Evans Lance, MD as PCP - Electrophysiology (Cardiology)  Indicate any recent Medical Services you may have received from other than [facility] providers in the past year (date may be approximate).     Assessment:   This is a routine wellness examination for Claude.  Hearing/Vision screen Vision Screening - Comments:: Regular eye exams, [facility]  Dietary issues and exercise activities discussed: Current Exercise Habits: Home exercise routine, Type of exercise: walking, Time (Minutes): 20, Frequency (Times/Week): 7, Weekly Exercise (Minutes/Week): 140   Goals Addressed             This Visit's Progress    Patient Stated       05/14/2022, drink more water       Depression Screen    05/14/2022    1:43 PM 02/14/2022    2:17 PM 02/07/2022    3:44 PM 01/15/2022   10:27 AM 11/30/2021    8:34 AM 08/08/2021   11:33 AM 04/25/2021   11:55 AM  PHQ 2/9 Scores  PHQ - 2 Score 0 0 0 0 0 0 0    Fall Risk    05/14/2022    1:42 PM 02/14/2022    2:17 PM 02/07/2022    3:44 PM 01/15/2022   10:28 AM 12/05/2021   11:24 AM  [place] in the past year? '1 1 1 '$ 0 0  Comment lost balance      Number falls in past yr: '1 1 1 '$ 0 1  Injury with Fall? '1 1   1  '$ Comment hit head      Risk for fall due to : Impaired balance/gait;Impaired  mobility;Medication side effect      Follow up Falls evaluation completed;Education provided;Falls prevention discussed        FALL RISK PREVENTION PERTAINING TO THE HOME:  Any stairs in or around the home? Yes  If so, are there any without handrails? no Home free of loose throw rugs in walkways, pet beds, electrical cords, etc? Yes  Adequate lighting in your home to reduce risk of falls? Yes   ASSISTIVE DEVICES UTILIZED TO PREVENT FALLS:  Life alert? No  Use of a cane, walker or w/c? Yes  Grab bars in the bathroom? Yes  Shower chair or bench in shower? Yes  Elevated toilet seat or a handicapped toilet? Yes   TIMED UP AND GO:  Was the test performed? No .      Cognitive Function:  6 CIT not administered. Patient has a diagnosis of dementia. Followed by neurology.     02/20/2022    2:20 PM 08/08/2021    9:20 AM  MMSE - Mini Mental State Exam  Orientation to time 5 4  Orientation  to Place 5 5  Registration 3 3  Attention/ Calculation 0 0  Recall 0 0  Language- name 2 objects 2 2  Language- repeat 1 0  Language- follow 3 step command 3 3  Language- read & follow direction 1 1  Write a sentence 1 1  Copy design 0 0  Total score 21 19        Immunizations Immunization History  Administered Date(s) Administered   Influenza, High Dose Seasonal PF 03/29/2019, 02/07/2022   Influenza-Unspecified 02/24/2008, 03/08/2018, 01/19/2019, 02/17/2021   Moderna Sars-Covid-2 Vaccination 07/26/2019, 08/27/2019   PFIZER(Purple Top)SARS-COV-2 Vaccination 07/30/2019, 09/01/2019   Tdap 05/13/2020    TDAP status: Up to date  Flu Vaccine status: Up to date  Pneumococcal vaccine status: Up to date  Covid-19 vaccine status: Completed vaccines  Qualifies for Shingles Vaccine? Yes   Zostavax completed No   Shingrix Completed?: No.    Education has been provided regarding the importance of this vaccine. Patient has been advised to call insurance company to determine out of pocket  expense if they have not yet received this vaccine. Advised may also receive vaccine at local pharmacy or Health Dept. Verbalized acceptance and understanding.  Screening Tests Health Maintenance  Topic Date Due   Medicare Annual Wellness (AWV)  Never done   OPHTHALMOLOGY EXAM  10/18/2021   FOOT EXAM  10/30/2021   HEMOGLOBIN A1C  06/02/2022   DTaP/Tdap/Td (2 - Td or Tdap) 05/13/2030   INFLUENZA VACCINE  Completed   HPV VACCINES  Aged Out   Pneumonia Vaccine 64+ Years old  Discontinued   COVID-19 Vaccine  Discontinued   Zoster Vaccines- Shingrix  Discontinued    Health Maintenance  Health Maintenance Due  Topic Date Due   Medicare Annual Wellness (AWV)  Never done   OPHTHALMOLOGY EXAM  10/18/2021   FOOT EXAM  10/30/2021    Colorectal cancer screening: No longer required.   Lung Cancer Screening: (Low Dose CT Chest recommended if Age 40-80 years, 30 pack-year currently smoking OR have quit w/in 15years.) does not qualify.   Lung Cancer Screening Referral: no  Additional Screening:  Hepatitis C Screening: does not qualify;   Vision Screening: Recommended annual ophthalmology exams for early detection of glaucoma and other disorders of the eye. Is the patient up to date with their annual eye exam?  Yes  Who is the provider or what is the name of the office in which the patient attends annual eye exams? [facility] If pt is not established with a provider, would they like to be referred to a provider to establish care? No .   Dental Screening: Recommended annual dental exams for proper oral hygiene  Community Resource Referral / Chronic Care Management: CRR required this visit?  No   CCM required this visit?  No      Plan:     I have personally reviewed and noted the following in the patient's chart:   Medical and social history Use of alcohol, tobacco or illicit drugs  Current medications and supplements including opioid prescriptions. Patient is not currently  taking opioid prescriptions. Functional ability and status Nutritional status Physical activity Advanced directives List of other physicians Hospitalizations, surgeries, and ER visits in previous 12 months Vitals Screenings to include cognitive, depression, and falls Referrals and appointments  In addition, I have reviewed and discussed with patient certain preventive protocols, quality metrics, and best practice recommendations. A written personalized care plan for preventive services as well as general preventive health recommendations  were provided to patient.     Kellie Simmering, LPN   [id number]   Nurse Notes: none  Due to this being a virtual visit, the after visit summary with patients personalized plan was offered to patient via mail or my-chart.  Patient would like to access on my-chart

## 2022-05-21 ENCOUNTER — Telehealth: Payer: Self-pay | Admitting: Family Medicine

## 2022-05-21 ENCOUNTER — Encounter: Payer: Self-pay | Admitting: Internal Medicine

## 2022-05-21 ENCOUNTER — Ambulatory Visit: Payer: Medicare PPO | Attending: Internal Medicine | Admitting: Internal Medicine

## 2022-05-21 VITALS — BP 140/58 | HR 108 | Ht 70.0 in | Wt 158.0 lb

## 2022-05-21 DIAGNOSIS — I4821 Permanent atrial fibrillation: Secondary | ICD-10-CM | POA: Diagnosis not present

## 2022-05-21 NOTE — Telephone Encounter (Signed)
Caller Name: pt granddaughter Call back phone #: [phone]   MEDICATION(S):  XARELTO 15 MG TABS tablet Medication Date: 01/22/2022 Department: [facility]    Days of Med Remaining:   Has the patient contacted their pharmacy (YES/NO)? no What did pharmacy advise?   Preferred Pharmacy:  [id number] Phone: [phone] Fax: [phone]     ~~~Please advise patient/caregiver to allow 2-3 business days to process RX refills.

## 2022-05-21 NOTE — Patient Instructions (Signed)
Medication Instructions:  Your physician recommends that you continue on your current medications as directed. Please refer to the Current Medication list given to you today.  *If you need a refill on your cardiac medications before your next appointment, please call your pharmacy*  Lab Work: NONE If you have labs (blood work) drawn today and your tests are completely normal, you will receive your results only by: [place] (if you have MyChart) OR A paper copy in the mail If you have any lab test that is abnormal or we need to change your treatment, we will call you to review the results.  Testing/Procedures: NONE  Follow-Up: At [facility], you and your health needs are our priority.  As part of our continuing mission to provide you with exceptional heart care, we have created designated Provider Care Teams.  These Care Teams include your primary Cardiologist (physician) and Advanced Practice Providers (APPs -  Physician Assistants and Nurse Practitioners) who all work together to provide you with the care you need, when you need it.  Your next appointment:   1 year(s)  The format for your next appointment:   In Person  Provider:   You may see Cristopher Peru, MD or one of the following Advanced Practice Providers on your designated Care Team:   Tommye Standard, [place] Legrand Como "Jonni Sanger" Chalmers Cater, [place]    Important Information About Sugar

## 2022-05-21 NOTE — Progress Notes (Signed)
HPI [name] returns today for followup. He is a pleasant 89 yo man with CHB, s/p PPM insertion. He has a remote stroke. He has some residual. He denies chest pain or sob. No syncope. His bp has been controlled. He has had some swelling in his legs. He denies palpitations.  No Known Allergies   Current Outpatient Medications  Medication Sig Dispense Refill   divalproex (DEPAKOTE) 125 MG DR tablet Take 1 tablet every other day. (Patient taking differently: 2 (two) times daily.) 45 tablet 1   docusate sodium (COLACE) 100 MG capsule Take 1 capsule (100 mg total) by mouth every other day.     ferrous sulfate (FEROSUL) 325 (65 FE) MG tablet Take 1 tablet (325 mg total) by mouth daily. 90 tablet 1   finasteride (PROSCAR) 5 MG tablet Take 1 tablet (5 mg total) by mouth daily. (Patient taking differently: Take 5 mg by mouth every evening.) 90 tablet 1   folic acid (FOLVITE) 1 MG tablet Take 1 tablet (1 mg total) by mouth daily. 90 tablet 3   furosemide (LASIX) 20 MG tablet Take 1 tablet (20 mg total) by mouth daily. 30 tablet 0   JARDIANCE 10 MG TABS tablet Take 10 mg by mouth every morning.     latanoprost (XALATAN) 0.005 % ophthalmic solution Place 1 drop into both eyes at bedtime.     Melatonin 3 MG TBDP Take by mouth.     memantine (NAMENDA) 10 MG tablet Take 1 tablet (10 mg total) by mouth 2 (two) times daily. 180 tablet 3   metoprolol succinate (TOPROL-XL) 25 MG 24 hr tablet Take 1 tablet (25 mg total) by mouth daily. 30 tablet 0   Nutritional Supplements (GLUCERNA PO) Take 1 Bottle by mouth once a week.     pantoprazole (PROTONIX) 40 MG tablet TAKE 1 TABLET(40 MG) BY MOUTH DAILY 90 tablet 1   pioglitazone (ACTOS) 15 MG tablet Take 1 tablet (15 mg total) by mouth daily. 30 tablet 5   polyethylene glycol powder (GLYCOLAX/MIRALAX) 17 GM/SCOOP powder Take 17 g by mouth 2 (two) times daily as needed. 3350 g 1   Pseudoeph-Doxylamine-DM-APAP (NYQUIL PO) Take 1 Dose by mouth daily as needed  (sleep).     QUEtiapine (SEROQUEL) 25 MG tablet Take 25 mg by mouth at bedtime.     tamsulosin (FLOMAX) 0.4 MG CAPS capsule Take 1 capsule (0.4 mg total) by mouth daily. 90 capsule 3   vitamin B-12 (CYANOCOBALAMIN) 500 MCG tablet Take 500 mcg by mouth daily.     XARELTO 15 MG TABS tablet TAKE 1 TABLET DAILY WITH SUPPER 90 tablet 1   No current facility-administered medications for this visit.     Past Medical History:  Diagnosis Date   Arthritis    Atrial fibrillation ([place])    CKD (chronic kidney disease)    History of hiatal hernia    Hx SBO    Last 2013 all treated conservatively   Hypertension    Presence of permanent cardiac pacemaker    Prostate cancer ([place])    Stroke ([place]) 2017   left facial drooping noted 08/14/2016   Tachycardia-bradycardia syndrome ([place])    Type II diabetes mellitus ([place])    Ventral hernia    x3    ROS:   All systems reviewed and negative except as noted in the HPI.   Past Surgical History:  Procedure Laterality Date   ABDOMINAL AORTOGRAM W/LOWER EXTREMITY N/A 08/07/2016   Procedure: Abdominal Aortogram  w/Lower Extremity;  Surgeon: Wellington Hampshire, MD;  Location: [place];  Service: Cardiovascular;  Laterality: N/A;   CHOLECYSTECTOMY OPEN  03/31/2001   Dr Lindon Romp   COLON SURGERY     EXPLORATORY LAPAROTOMY  08/2004   Archie Endo 10/01/2010   HEMORRHOID SURGERY     HERNIA REPAIR     HIATAL HERNIA REPAIR  2002   INCISIONAL HERNIA REPAIR  08/2004   Archie Endo 10/01/2010   INSERT / REPLACE / REMOVE PACEMAKER     PERIPHERAL VASCULAR BALLOON ANGIOPLASTY  08/07/2016   Procedure: Peripheral Vascular Balloon Angioplasty;  Surgeon: Wellington Hampshire, MD;  Location: [facility];  Service: Cardiovascular;;  left popliteal artery   PERIPHERAL VASCULAR INTERVENTION  08/14/2016   popliteal artery/notes 08/14/2016   PERIPHERAL VASCULAR INTERVENTION Left 08/14/2016   Procedure: Peripheral Vascular Intervention;  Surgeon: Wellington Hampshire, MD;  Location: [facility];  Service: Cardiovascular;  Laterality: Left;  popliteal artery   PERMANENT PACEMAKER GENERATOR CHANGE N/A 04/06/2012   Procedure: PERMANENT PACEMAKER GENERATOR CHANGE;  Surgeon: Evans Lance, MD;  Location: [facility];  Service: Cardiovascular;  Laterality: N/A;   PPM GENERATOR CHANGEOUT N/A 02/04/2022   Procedure: PPM GENERATOR CHANGEOUT;  Surgeon: Evans Lance, MD;  Location: [facility] [place] [facility];  Service: Cardiovascular;  Laterality: N/A;   SMALL INTESTINE SURGERY  09/16/2004   SBO resection, VH repair     Family History  Problem Relation Age of Onset   Pneumonia Father    Stroke Brother    Stroke Sister    Cancer Brother        type unknown     Social History   Socioeconomic History   Marital status: Widowed    Spouse name: Not on file   Number of children: Not on file   Years of education: Not on file   Highest education level: Not on file  Occupational History   Not on file  Tobacco Use   Smoking status: Former    Packs/day: 1.50    Years: 15.00    Total pack years: 22.50    Types: Cigarettes    Quit date: 04/16/1955    Years since quitting: 67.1    Passive exposure: Past   Smokeless tobacco: Former    Types: Chew    Quit date: 08/17/1952  Vaping Use   Vaping Use: Never used  Substance and Sexual Activity   Alcohol use: No    Comment: 08/14/2016 "drank beer when I was a teenager"   Drug use: No   Sexual activity: Not Currently  Other Topics Concern   Not on file  Social History Narrative   Lives alone   Social Determinants of Health   Financial Resource Strain: Low Risk  (05/14/2022)   Overall Financial Resource Strain (CARDIA)    Difficulty of Paying Living Expenses: Not hard at all  Food Insecurity: No Food Insecurity (05/14/2022)   Hunger Vital Sign    Worried About Running Out of Food in the Last Year: Never true    [place] in the Last Year: Never true  Transportation Needs: No Transportation Needs (05/14/2022)    PRAPARE - Hydrologist (Medical): No    Lack of Transportation (Non-Medical): No  Physical Activity: Insufficiently Active (05/14/2022)   Exercise Vital Sign    Days of Exercise per Week: 7 days    Minutes of Exercise per Session: 20 min  Stress: No Stress  Concern Present (05/14/2022)   [place]    Feeling of Stress : Not at all  Social Connections: Not on file  Intimate Partner Violence: Not on file     BP (!) 140/58   Pulse (!) 108   Ht '5\' 10"'$  (1.778 m)   Wt 158 lb (71.7 kg)   SpO2 99%   BMI 22.67 kg/m   Physical Exam:  Well appearing NAD HEENT: Unremarkable Neck:  No JVD, no thyromegally Lymphatics:  No adenopathy Back:  No CVA tenderness Lungs:  Clear, with well-healed pacemaker incision. HEART:  Regular rate rhythm, no murmurs, no rubs, no clicks Abd:  soft, positive bowel sounds, no organomegally, no rebound, no guarding Ext:  2 plus pulses, no edema, no cyanosis, no clubbing Skin:  No rashes no nodules Neuro:  CN II through XII intact, motor grossly intact   DEVICE  Normal device function.  See PaceArt for details.   Assess/Plan: 1. Atrial fib - his rates are increased a bit but he is asymptomatic. He does not have palpitations. He remains on Xarelto.  2. Diastolic heart failure - he will continue his current meds. He will maintain a low sodium diet. 3. Venous insufficiency - I asked him to elevate his legs and avoid salty foods. 4. Wt loss - his weight is back up over the past year.    Carleene Overlie Kaylor Maiers,MD

## 2022-05-22 ENCOUNTER — Other Ambulatory Visit: Payer: Self-pay

## 2022-05-22 ENCOUNTER — Encounter: Payer: Self-pay | Admitting: Family Medicine

## 2022-05-22 DIAGNOSIS — I480 Paroxysmal atrial fibrillation: Secondary | ICD-10-CM

## 2022-05-22 MED ORDER — RIVAROXABAN 15 MG PO TABS: ORAL_TABLET | ORAL | 1 refills | Status: DC

## 2022-05-22 NOTE — Telephone Encounter (Signed)
Requested Refills sent in

## 2022-05-24 ENCOUNTER — Ambulatory Visit
Admission: EM | Admit: 2022-05-24 | Discharge: 2022-05-24 | Disposition: A | Discharge status: Home / Self Care | Payer: PPO | Attending: Urgent Care | Admitting: Urgent Care

## 2022-05-24 DIAGNOSIS — L03116 Cellulitis of left lower limb: Secondary | ICD-10-CM

## 2022-05-24 DIAGNOSIS — N189 Chronic kidney disease, unspecified: Secondary | ICD-10-CM

## 2022-05-24 DIAGNOSIS — L03115 Cellulitis of right lower limb: Secondary | ICD-10-CM

## 2022-05-24 MED ORDER — BACITRACIN ZINC 500 UNIT/GM EX OINT: 1.0000 | TOPICAL_OINTMENT | Freq: Two times a day (BID) | CUTANEOUS | 0 refills | Status: DC

## 2022-05-24 MED ORDER — DOXYCYCLINE HYCLATE 100 MG PO CAPS: 100.0000 mg | ORAL_CAPSULE | Freq: Two times a day (BID) | ORAL | 0 refills | Status: DC

## 2022-05-24 NOTE — ED Triage Notes (Signed)
Per granddaughter pt with sores x 3 to RLE started 12/31-areas have been dressed by [facility] RN-pt with slow gait/own cane

## 2022-05-24 NOTE — Discharge Instructions (Addendum)
Change your dressing 2-3 times daily. Every time you change your dressing, clean the wound gently with warm water and Dial antibacterial soap. Pat the wound dry, let it breathe for roughly an hour before covering it back up. When you reapply a dressing, apply Bacitracin ointment to the wound, then cover with non-stick/non-adherent gauze.  Secure it with Coban.

## 2022-05-24 NOTE — ED Provider Notes (Signed)
[facility]  Note:  This document was prepared using Systems analyst and may include unintentional dictation errors.  MRN: [id number] DOB: [birth year]  Subjective:   [name] is a 89 y.o. male presenting for 5-day history of acute onset boils and lesions over the right lower leg now starting to happen on the left lower leg.  Was seen by his regular doctor and advised wound care.  Has a history of CKD. Last creatinine was 1.84, GFR 10m/min from 03/09/2022.  Patient reports that the areas are tender, draining and weeping.  No current facility-administered medications for this encounter.  Current Outpatient Medications:    divalproex (DEPAKOTE) 125 MG DR tablet, Take 1 tablet every other day. (Patient taking differently: 2 (two) times daily.), Disp: 45 tablet, Rfl: 1   docusate sodium (COLACE) 100 MG capsule, Take 1 capsule (100 mg total) by mouth every other day., Disp: , Rfl:    ferrous sulfate (FEROSUL) 325 (65 FE) MG tablet, Take 1 tablet (325 mg total) by mouth daily., Disp: 90 tablet, Rfl: 1   finasteride (PROSCAR) 5 MG tablet, Take 1 tablet (5 mg total) by mouth daily. (Patient taking differently: Take 5 mg by mouth every evening.), Disp: 90 tablet, Rfl: 1   folic acid (FOLVITE) 1 MG tablet, Take 1 tablet (1 mg total) by mouth daily., Disp: 90 tablet, Rfl: 3   furosemide (LASIX) 20 MG tablet, Take 1 tablet (20 mg total) by mouth daily., Disp: 30 tablet, Rfl: 0   JARDIANCE 10 MG TABS tablet, Take 10 mg by mouth every morning., Disp: , Rfl:    latanoprost (XALATAN) 0.005 % ophthalmic solution, Place 1 drop into both eyes at bedtime., Disp: , Rfl:    Melatonin 3 MG TBDP, Take by mouth., Disp: , Rfl:    memantine (NAMENDA) 10 MG tablet, Take 1 tablet (10 mg total) by mouth 2 (two) times daily., Disp: 180 tablet, Rfl: 3   metoprolol succinate (TOPROL-XL) 25 MG 24 hr tablet, Take 1 tablet (25 mg total) by mouth daily., Disp: 30 tablet, Rfl:  0   Nutritional Supplements (GLUCERNA PO), Take 1 Bottle by mouth once a week., Disp: , Rfl:    pantoprazole (PROTONIX) 40 MG tablet, TAKE 1 TABLET(40 MG) BY MOUTH DAILY, Disp: 90 tablet, Rfl: 1   pioglitazone (ACTOS) 15 MG tablet, Take 1 tablet (15 mg total) by mouth daily., Disp: 30 tablet, Rfl: 5   polyethylene glycol powder (GLYCOLAX/MIRALAX) 17 GM/SCOOP powder, Take 17 g by mouth 2 (two) times daily as needed., Disp: 3350 g, Rfl: 1   Pseudoeph-Doxylamine-DM-APAP (NYQUIL PO), Take 1 Dose by mouth daily as needed (sleep)., Disp: , Rfl:    QUEtiapine (SEROQUEL) 25 MG tablet, Take 25 mg by mouth at bedtime., Disp: , Rfl:    Rivaroxaban (XARELTO) 15 MG TABS tablet, TAKE 1 TABLET DAILY WITH SUPPER, Disp: 90 tablet, Rfl: 1   tamsulosin (FLOMAX) 0.4 MG CAPS capsule, Take 1 capsule (0.4 mg total) by mouth daily., Disp: 90 capsule, Rfl: 3   vitamin B-12 (CYANOCOBALAMIN) 500 MCG tablet, Take 500 mcg by mouth daily., Disp: , Rfl:    No Known Allergies  Past Medical History:  Diagnosis Date   Arthritis    Atrial fibrillation ([place]    CKD (chronic kidney disease)    History of hiatal hernia    Hx SBO    Last 2013 all treated conservatively   Hypertension    Presence of permanent cardiac pacemaker  Prostate cancer [facility])    Stroke ([place]) 2017   left facial drooping noted 08/14/2016   Tachycardia-bradycardia syndrome ([place])    Type II diabetes mellitus ([place])    Ventral hernia    x3     Past Surgical History:  Procedure Laterality Date   ABDOMINAL AORTOGRAM W/LOWER EXTREMITY N/A 08/07/2016   Procedure: Abdominal Aortogram w/Lower Extremity;  Surgeon: Wellington Hampshire, MD;  Location: [facility];  Service: Cardiovascular;  Laterality: N/A;   CHOLECYSTECTOMY OPEN  03/31/2001   Dr Lindon Romp   COLON SURGERY     EXPLORATORY LAPAROTOMY  08/2004   Archie Endo 10/01/2010   HEMORRHOID SURGERY     HERNIA REPAIR     HIATAL HERNIA REPAIR  2002   INCISIONAL HERNIA REPAIR  08/2004   Archie Endo 10/01/2010    INSERT / REPLACE / REMOVE PACEMAKER     PERIPHERAL VASCULAR BALLOON ANGIOPLASTY  08/07/2016   Procedure: Peripheral Vascular Balloon Angioplasty;  Surgeon: Wellington Hampshire, MD;  Location: [place] [facility];  Service: Cardiovascular;;  left popliteal artery   PERIPHERAL VASCULAR INTERVENTION  08/14/2016   popliteal artery/notes 08/14/2016   PERIPHERAL VASCULAR INTERVENTION Left 08/14/2016   Procedure: Peripheral Vascular Intervention;  Surgeon: Wellington Hampshire, MD;  Location: [place] [facility];  Service: Cardiovascular;  Laterality: Left;  popliteal artery   PERMANENT PACEMAKER GENERATOR CHANGE N/A 04/06/2012   Procedure: PERMANENT PACEMAKER GENERATOR CHANGE;  Surgeon: Evans Lance, MD;  Location: [facility] CATH LAB;  Service: Cardiovascular;  Laterality: N/A;   PPM GENERATOR CHANGEOUT N/A 02/04/2022   Procedure: PPM GENERATOR CHANGEOUT;  Surgeon: Evans Lance, MD;  Location: [place] [facility];  Service: Cardiovascular;  Laterality: N/A;   SMALL INTESTINE SURGERY  09/16/2004   SBO resection, VH repair    Family History  Problem Relation Age of Onset   Pneumonia Father    Stroke Brother    Stroke Sister    Cancer Brother        type unknown    Social History   Tobacco Use   Smoking status: Former    Packs/day: 1.50    Years: 15.00    Total pack years: 22.50    Types: Cigarettes    Quit date: 04/16/1955    Years since quitting: 67.1    Passive exposure: Past   Smokeless tobacco: Former    Types: Chew    Quit date: 08/17/1952  Vaping Use   Vaping Use: Never used  Substance Use Topics   Alcohol use: No    Comment: 08/14/2016 "drank beer when I was a teenager"   Drug use: No    ROS   Objective:   Vitals: BP 138/88 (BP Location: Left Arm)   Pulse 95   Temp 98.8 F (37.1 C) (Oral)   Resp 20   SpO2 98%   Physical Exam Constitutional:      General: He is not in acute distress.    Appearance: Normal appearance. He is well-developed and normal weight. He is not  ill-appearing, toxic-appearing or diaphoretic.  HENT:     Head: Normocephalic and atraumatic.     Right Ear: External ear normal.     Left Ear: External ear normal.     Nose: Nose normal.     Mouth/Throat:     Pharynx: Oropharynx is clear.  Eyes:     General: No scleral icterus.       Right eye: No discharge.        Left eye:  No discharge.     Extraocular Movements: Extraocular movements intact.  Cardiovascular:     Rate and Rhythm: Normal rate.  Pulmonary:     Effort: Pulmonary effort is normal.  Musculoskeletal:     Cervical back: Normal range of motion.  Skin:    Findings: Rash (lower legs erythematous bilaterally with 2 resolved wound over the right lower leg as depicted) present.  Neurological:     Mental Status: He is alert and oriented to person, place, and time.  Psychiatric:        Mood and Affect: Mood normal.        Behavior: Behavior normal.        Thought Content: Thought content normal.        Judgment: Judgment normal.          A dressing was applied to the right lower leg.  Assessment and Plan :   PDMP not reviewed this encounter.  1. Bilateral cellulitis of lower leg   2. Chronic kidney disease, unspecified CKD stage     Given his CKD will manage with doxycycline, and general wound care.  Follow-up with PCP as soon as possible. Counseled patient on potential for adverse effects with medications prescribed/recommended today, ER and return-to-clinic precautions discussed, patient verbalized understanding.    Jaynee Eagles, [place] 05/25/22 [phone]

## 2022-05-26 ENCOUNTER — Other Ambulatory Visit: Payer: Self-pay | Admitting: Family Medicine

## 2022-05-26 DIAGNOSIS — F03911 Unspecified dementia, unspecified severity, with agitation: Secondary | ICD-10-CM

## 2022-05-30 ENCOUNTER — Encounter: Payer: Self-pay | Admitting: Family Medicine

## 2022-05-30 ENCOUNTER — Ambulatory Visit (INDEPENDENT_AMBULATORY_CARE_PROVIDER_SITE_OTHER): Payer: PPO | Admitting: Family Medicine

## 2022-05-30 VITALS — HR 79 | Temp 96.0°F | Ht 70.0 in | Wt 161.8 lb

## 2022-05-30 DIAGNOSIS — I739 Peripheral vascular disease, unspecified: Secondary | ICD-10-CM

## 2022-05-30 DIAGNOSIS — L97901 Non-pressure chronic ulcer of unspecified part of unspecified lower leg limited to breakdown of skin: Secondary | ICD-10-CM

## 2022-05-30 DIAGNOSIS — I482 Chronic atrial fibrillation, unspecified: Secondary | ICD-10-CM | POA: Diagnosis not present

## 2022-05-30 DIAGNOSIS — D509 Iron deficiency anemia, unspecified: Secondary | ICD-10-CM

## 2022-05-30 DIAGNOSIS — N1832 Chronic kidney disease, stage 3b: Secondary | ICD-10-CM

## 2022-05-30 NOTE — Progress Notes (Signed)
Established Patient Office Visit   Subjective:  Patient ID: [name], male    DOB: 01-20-1924  Age: 89 y.o. MRN: [id number]  Chief Complaint  Patient presents with   Acute Visit    C/o having RT Leg swelling/leaking fluid.    HPI Encounter Diagnoses  Name Primary?   Chronic atrial fibrillation ([name]) Yes   Stage 3b chronic kidney disease ([name])    PVD (peripheral vascular disease) ([name])    Ulcer of lower extremity, limited to breakdown of skin, unspecified laterality ([name])    Iron deficiency anemia, unspecified iron deficiency anemia type    Follow-up of emergency room visit status post treatment with doxycycline for cellulitis.  History of lower extremity ulcerations treated by visiting home health with Unna boots.  History of peripheral vascular disease of the lower extremities.  History of CKD.  History of atrial fibrillation.  History of iron deficiency.  Continues iron sulfate 325 mg daily.   Review of Systems  Constitutional: Negative.   HENT: Negative.    Eyes:  Negative for blurred vision, discharge and redness.  Respiratory: Negative.  Negative for shortness of breath.   Cardiovascular: Negative.   Gastrointestinal:  Negative for abdominal pain.  Genitourinary: Negative.   Musculoskeletal: Negative.  Negative for myalgias.  Skin:  Negative for rash.  Neurological:  Negative for tingling, loss of consciousness and weakness.  Endo/Heme/Allergies:  Negative for polydipsia.     Current Outpatient Medications:    bacitracin ointment, Apply 1 Application topically 2 (two) times daily., Disp: 120 g, Rfl: 0   divalproex (DEPAKOTE SPRINKLE) 125 MG capsule, Take 1 capsule (125 mg total) by mouth 2 (two) times daily., Disp: 60 capsule, Rfl: 0   docusate sodium (COLACE) 100 MG capsule, Take 1 capsule (100 mg total) by mouth every other day., Disp: , Rfl:    doxycycline (VIBRAMYCIN) 100 MG capsule, Take 1 capsule (100 mg total) by mouth 2 (two) times daily., Disp: 20  capsule, Rfl: 0   ferrous sulfate (FEROSUL) 325 (65 FE) MG tablet, Take 1 tablet (325 mg total) by mouth daily., Disp: 90 tablet, Rfl: 1   finasteride (PROSCAR) 5 MG tablet, Take 1 tablet (5 mg total) by mouth daily. (Patient taking differently: Take 5 mg by mouth every evening.), Disp: 90 tablet, Rfl: 1   folic acid (FOLVITE) 1 MG tablet, Take 1 tablet (1 mg total) by mouth daily., Disp: 90 tablet, Rfl: 3   furosemide (LASIX) 20 MG tablet, Take 1 tablet (20 mg total) by mouth daily., Disp: 30 tablet, Rfl: 0   JARDIANCE 10 MG TABS tablet, Take 10 mg by mouth every morning., Disp: , Rfl:    latanoprost (XALATAN) 0.005 % ophthalmic solution, Place 1 drop into both eyes at bedtime., Disp: , Rfl:    Melatonin 3 MG TBDP, Take by mouth., Disp: , Rfl:    memantine (NAMENDA) 10 MG tablet, Take 1 tablet (10 mg total) by mouth 2 (two) times daily., Disp: 180 tablet, Rfl: 3   metoprolol succinate (TOPROL-XL) 25 MG 24 hr tablet, Take 1 tablet (25 mg total) by mouth daily., Disp: 30 tablet, Rfl: 0   Nutritional Supplements (GLUCERNA PO), Take 1 Bottle by mouth once a week., Disp: , Rfl:    pantoprazole (PROTONIX) 40 MG tablet, TAKE 1 TABLET(40 MG) BY MOUTH DAILY, Disp: 90 tablet, Rfl: 1   pioglitazone (ACTOS) 15 MG tablet, Take 1 tablet (15 mg total) by mouth daily., Disp: 30 tablet, Rfl: 5   polyethylene glycol  powder (GLYCOLAX/MIRALAX) 17 GM/SCOOP powder, Take 17 g by mouth 2 (two) times daily as needed., Disp: 3350 g, Rfl: 1   Pseudoeph-Doxylamine-DM-APAP (NYQUIL PO), Take 1 Dose by mouth daily as needed (sleep)., Disp: , Rfl:    QUEtiapine (SEROQUEL) 25 MG tablet, Take 25 mg by mouth at bedtime., Disp: , Rfl:    Rivaroxaban (XARELTO) 15 MG TABS tablet, TAKE 1 TABLET DAILY WITH SUPPER, Disp: 90 tablet, Rfl: 1   tamsulosin (FLOMAX) 0.4 MG CAPS capsule, Take 1 capsule (0.4 mg total) by mouth daily., Disp: 90 capsule, Rfl: 3   vitamin B-12 (CYANOCOBALAMIN) 500 MCG tablet, Take 500 mcg by mouth daily., Disp: ,  Rfl:    Objective:     Pulse 79   Temp (!) 96 F (35.6 C) (Temporal)   Ht '5\' 10"'$  (1.778 m)   Wt 161 lb 12.8 oz (73.4 kg)   SpO2 99%   BMI 23.22 kg/m    Physical Exam Constitutional:      General: He is not in acute distress.    Appearance: Normal appearance. He is not ill-appearing, toxic-appearing or diaphoretic.  HENT:     Head: Normocephalic and atraumatic.     Right Ear: External ear normal.     Left Ear: External ear normal.     Mouth/Throat:     Mouth: Mucous membranes are moist.     Pharynx: Oropharynx is clear. No oropharyngeal exudate or posterior oropharyngeal erythema.  Eyes:     General: No scleral icterus.       Right eye: No discharge.        Left eye: No discharge.     Extraocular Movements: Extraocular movements intact.     Conjunctiva/sclera: Conjunctivae normal.     Pupils: Pupils are equal, round, and reactive to light.  Cardiovascular:     Rate and Rhythm: Normal rate. Rhythm irregularly irregular.  Pulmonary:     Effort: Pulmonary effort is normal. No respiratory distress.     Breath sounds: Normal breath sounds.  Abdominal:     General: Bowel sounds are normal.     Tenderness: There is no abdominal tenderness. There is no guarding.  Musculoskeletal:     Cervical back: No rigidity or tenderness.       Legs:  Skin:    General: Skin is warm and dry.  Neurological:     Mental Status: He is alert and oriented to person, place, and time.  Psychiatric:        Mood and Affect: Mood normal.        Behavior: Behavior normal.      No results found for any visits on 05/30/22.    The ASCVD Risk score (Arnett DK, et al., 2019) failed to calculate for the following reasons:   The 2019 ASCVD risk score is only valid for ages 21 to 84   The patient has a prior MI or stroke diagnosis    Assessment & Plan:   Chronic atrial fibrillation ([name])  Stage 3b chronic kidney disease ([place]) -     Basic metabolic panel  PVD (peripheral vascular disease)  ([place]) -     CBC  Ulcer of lower extremity, limited to breakdown of skin, unspecified laterality ([name]) -     CBC  Iron deficiency anemia, unspecified iron deficiency anemia type -     Iron, TIBC and Ferritin Panel    Return Continue with Unna boots.  Consider changing every 5 days.  Follow-up with visiting home health.Marland Kitchen  [name], MD

## 2022-05-31 LAB — BASIC METABOLIC PANEL
BUN: 25 mg/dL — ABNORMAL HIGH (ref 6–23)
CO2: 26 mEq/L (ref 19–32)
Calcium: 7.9 mg/dL — ABNORMAL LOW (ref 8.4–10.5)
Chloride: 107 mEq/L (ref 96–112)
Creatinine, Ser: 2.08 mg/dL — ABNORMAL HIGH (ref 0.40–1.50)
GFR: 26.43 mL/min — ABNORMAL LOW (ref >60.00)
Glucose, Bld: 152 mg/dL — ABNORMAL HIGH (ref 70–99)
Potassium: 4.3 mEq/L (ref 3.5–5.1)
Sodium: 141 mEq/L (ref 135–145)

## 2022-05-31 LAB — CBC
HCT: 29.3 % — ABNORMAL LOW (ref 39.0–52.0)
Hemoglobin: 9.5 g/dL — ABNORMAL LOW (ref 13.0–17.0)
MCHC: 32.4 g/dL (ref 30.0–36.0)
MCV: 78.9 fl (ref 78.0–100.0)
Platelets: 169 10*3/uL (ref 150.0–400.0)
RBC: 3.72 Mil/uL — ABNORMAL LOW (ref 4.22–5.81)
RDW: 17.8 % — ABNORMAL HIGH (ref 11.5–15.5)
WBC: 7.6 10*3/uL (ref 4.0–10.5)

## 2022-05-31 LAB — IRON,TIBC AND FERRITIN PANEL
%SAT: 20 % (calc) (ref 20–48)
Ferritin: 56 ng/mL (ref 24–380)
Iron: 48 ug/dL — ABNORMAL LOW (ref 50–180)
TIBC: 246 mcg/dL (calc) — ABNORMAL LOW (ref 250–425)

## 2022-06-07 ENCOUNTER — Other Ambulatory Visit: Payer: Self-pay | Admitting: Family Medicine

## 2022-06-07 ENCOUNTER — Other Ambulatory Visit: Payer: Self-pay | Admitting: Physician Assistant

## 2022-06-07 DIAGNOSIS — N138 Other obstructive and reflux uropathy: Secondary | ICD-10-CM

## 2022-06-08 ENCOUNTER — Encounter: Payer: Self-pay | Admitting: Internal Medicine

## 2022-06-13 ENCOUNTER — Ambulatory Visit (INDEPENDENT_AMBULATORY_CARE_PROVIDER_SITE_OTHER): Payer: PPO | Admitting: Family Medicine

## 2022-06-13 ENCOUNTER — Encounter: Payer: Self-pay | Admitting: Family Medicine

## 2022-06-13 VITALS — BP 110/68 | HR 105 | Temp 97.7°F | Ht 70.0 in | Wt 158.4 lb

## 2022-06-13 DIAGNOSIS — D509 Iron deficiency anemia, unspecified: Secondary | ICD-10-CM

## 2022-06-13 DIAGNOSIS — K5901 Slow transit constipation: Secondary | ICD-10-CM

## 2022-06-13 MED ORDER — IRON (FERROUS SULFATE) 325 (65 FE) MG PO TABS: ORAL_TABLET | ORAL | 1 refills | Status: DC

## 2022-06-13 NOTE — Progress Notes (Signed)
Established Patient Office Visit   Subjective:  Patient ID: [name], male    DOB: [birth year]  Age: 89 y.o. MRN: [id number]  Chief Complaint  Patient presents with   Edema    Concerns about swelling that come and go, constipation x 1 week. Complains about stomach pains frequently.     HPI Encounter Diagnoses  Name Primary?   Iron deficiency anemia, unspecified iron deficiency anemia type Yes   Slow transit constipation    Patient has developed constipation after restarting iron twice daily.  He is accompanied by his son-in-law and caregiver.  They were saying that his main complaint is that he he feels as though he is taking too many pills.  Also it is difficult for him to take all the pills he is given.  They wonder if we could discontinue some of his medications.   Review of Systems  Constitutional: Negative.   HENT:  Positive for hearing loss.   Eyes:  Negative for blurred vision, discharge and redness.  Respiratory: Negative.    Cardiovascular: Negative.   Gastrointestinal:  Positive for constipation. Negative for abdominal pain.  Genitourinary: Negative.   Musculoskeletal: Negative.  Negative for myalgias.  Skin:  Negative for rash.  Neurological:  Negative for tingling, loss of consciousness and weakness.  Endo/Heme/Allergies:  Negative for polydipsia.  Psychiatric/Behavioral:  Positive for memory loss.      Current Outpatient Medications:    divalproex (DEPAKOTE SPRINKLE) 125 MG capsule, Take 1 capsule (125 mg total) by mouth 2 (two) times daily., Disp: 60 capsule, Rfl: 0   docusate sodium (COLACE) 100 MG capsule, Take 1 capsule (100 mg total) by mouth every other day., Disp: , Rfl:    finasteride (PROSCAR) 5 MG tablet, TAKE 1 TABLET (5 MG TOTAL) BY MOUTH DAILY., Disp: 90 tablet, Rfl: 1   furosemide (LASIX) 20 MG tablet, TAKE 1 TABLET BY MOUTH EVERY DAY, Disp: 90 tablet, Rfl: 3   Iron, Ferrous Sulfate, 325 (65 Fe) MG TABS, Take 1 tablet every other day.,  Disp: 45 tablet, Rfl: 1   JARDIANCE 10 MG TABS tablet, Take 10 mg by mouth every morning., Disp: , Rfl:    latanoprost (XALATAN) 0.005 % ophthalmic solution, Place 1 drop into both eyes at bedtime., Disp: , Rfl:    Melatonin 3 MG TBDP, Take by mouth., Disp: , Rfl:    metoprolol succinate (TOPROL-XL) 25 MG 24 hr tablet, Take 1 tablet (25 mg total) by mouth daily., Disp: 30 tablet, Rfl: 0   pantoprazole (PROTONIX) 40 MG tablet, TAKE 1 TABLET(40 MG) BY MOUTH DAILY, Disp: 90 tablet, Rfl: 1   pioglitazone (ACTOS) 15 MG tablet, Take 1 tablet (15 mg total) by mouth daily., Disp: 30 tablet, Rfl: 5   polyethylene glycol powder (GLYCOLAX/MIRALAX) 17 GM/SCOOP powder, Take 17 g by mouth 2 (two) times daily as needed., Disp: 3350 g, Rfl: 1   QUEtiapine (SEROQUEL) 25 MG tablet, Take 25 mg by mouth at bedtime., Disp: , Rfl:    Rivaroxaban (XARELTO) 15 MG TABS tablet, TAKE 1 TABLET DAILY WITH SUPPER, Disp: 90 tablet, Rfl: 1   tamsulosin (FLOMAX) 0.4 MG CAPS capsule, Take 1 capsule (0.4 mg total) by mouth daily., Disp: 90 capsule, Rfl: 3   Objective:     BP 110/68 (BP Location: Left Arm, Patient Position: Sitting, Cuff Size: Normal)   Pulse (!) 105   Temp 97.7 F (36.5 C) (Temporal)   Ht '5\' 10"'$  (1.778 m)   Wt 158 lb 6.4 oz (  71.8 kg)   SpO2 95%   BMI 22.73 kg/m    Physical Exam Constitutional:      General: He is not in acute distress.    Appearance: Normal appearance. He is not ill-appearing, toxic-appearing or diaphoretic.  HENT:     Head: Normocephalic and atraumatic.     Right Ear: External ear normal.     Left Ear: External ear normal.  Eyes:     General: No scleral icterus.       Right eye: No discharge.        Left eye: No discharge.     Extraocular Movements: Extraocular movements intact.     Conjunctiva/sclera: Conjunctivae normal.  Pulmonary:     Effort: Pulmonary effort is normal. No respiratory distress.  Skin:    General: Skin is warm and dry.  Neurological:     Mental Status:  He is alert.  Psychiatric:        Mood and Affect: Mood normal.        Behavior: Behavior normal.      No results found for any visits on 06/13/22.    The ASCVD Risk score (Arnett DK, et al., 2019) failed to calculate for the following reasons:   The 2019 ASCVD risk score is only valid for ages 17 to 46   The patient has a prior MI or stroke diagnosis    Assessment & Plan:   Iron deficiency anemia, unspecified iron deficiency anemia type -     Iron (Ferrous Sulfate); Take 1 tablet every other day.  Dispense: 45 tablet; Refill: 1  Slow transit constipation    Return Stop Namenda, B12, folate.  Could continue Colace and MiraLAX daily. Use sennosides sparingly..  Have decreased iron sulfate 325 mg to every other day.  Advised that we should focus on Mr. Robitaille comfort going forward.  At this point I do not think the Namenda is helpful.   [name], MD

## 2022-06-15 ENCOUNTER — Encounter: Payer: Self-pay | Admitting: Neurology

## 2022-06-16 ENCOUNTER — Encounter: Payer: Self-pay | Admitting: Neurology

## 2022-07-06 ENCOUNTER — Emergency Department (HOSPITAL_COMMUNITY): Payer: No Typology Code available for payment source

## 2022-07-06 ENCOUNTER — Other Ambulatory Visit: Payer: Self-pay

## 2022-07-06 ENCOUNTER — Encounter (HOSPITAL_COMMUNITY): Payer: Self-pay

## 2022-07-06 ENCOUNTER — Inpatient Hospital Stay (HOSPITAL_COMMUNITY)
Admission: EM | Admit: 2022-07-06 | Discharge: 2022-07-10 | DRG: 291 | Disposition: A | Discharge status: Home / Self Care | Payer: No Typology Code available for payment source | Attending: Internal Medicine | Admitting: Internal Medicine

## 2022-07-06 DIAGNOSIS — I5033 Acute on chronic diastolic (congestive) heart failure: Secondary | ICD-10-CM | POA: Diagnosis present

## 2022-07-06 DIAGNOSIS — I130 Hypertensive heart and chronic kidney disease with heart failure and stage 1 through stage 4 chronic kidney disease, or unspecified chronic kidney disease: Secondary | ICD-10-CM | POA: Diagnosis not present

## 2022-07-06 DIAGNOSIS — K56600 Partial intestinal obstruction, unspecified as to cause: Secondary | ICD-10-CM | POA: Diagnosis not present

## 2022-07-06 DIAGNOSIS — F02A0 Dementia in other diseases classified elsewhere, mild, without behavioral disturbance, psychotic disturbance, mood disturbance, and anxiety: Secondary | ICD-10-CM | POA: Diagnosis present

## 2022-07-06 DIAGNOSIS — Z8673 Personal history of transient ischemic attack (TIA), and cerebral infarction without residual deficits: Secondary | ICD-10-CM

## 2022-07-06 DIAGNOSIS — K439 Ventral hernia without obstruction or gangrene: Secondary | ICD-10-CM | POA: Diagnosis not present

## 2022-07-06 DIAGNOSIS — N400 Enlarged prostate without lower urinary tract symptoms: Secondary | ICD-10-CM | POA: Diagnosis present

## 2022-07-06 DIAGNOSIS — Z1152 Encounter for screening for COVID-19: Secondary | ICD-10-CM

## 2022-07-06 DIAGNOSIS — S2242XA Multiple fractures of ribs, left side, initial encounter for closed fracture: Secondary | ICD-10-CM | POA: Diagnosis not present

## 2022-07-06 DIAGNOSIS — K5901 Slow transit constipation: Secondary | ICD-10-CM

## 2022-07-06 DIAGNOSIS — Z66 Do not resuscitate: Secondary | ICD-10-CM | POA: Diagnosis not present

## 2022-07-06 DIAGNOSIS — Z87891 Personal history of nicotine dependence: Secondary | ICD-10-CM

## 2022-07-06 DIAGNOSIS — Z950 Presence of cardiac pacemaker: Secondary | ICD-10-CM

## 2022-07-06 DIAGNOSIS — I495 Sick sinus syndrome: Secondary | ICD-10-CM | POA: Diagnosis present

## 2022-07-06 DIAGNOSIS — N1832 Chronic kidney disease, stage 3b: Secondary | ICD-10-CM | POA: Diagnosis present

## 2022-07-06 DIAGNOSIS — I5032 Chronic diastolic (congestive) heart failure: Secondary | ICD-10-CM | POA: Diagnosis present

## 2022-07-06 DIAGNOSIS — Z8546 Personal history of malignant neoplasm of prostate: Secondary | ICD-10-CM

## 2022-07-06 DIAGNOSIS — Z823 Family history of stroke: Secondary | ICD-10-CM

## 2022-07-06 DIAGNOSIS — C61 Malignant neoplasm of prostate: Secondary | ICD-10-CM

## 2022-07-06 DIAGNOSIS — J918 Pleural effusion in other conditions classified elsewhere: Secondary | ICD-10-CM | POA: Diagnosis present

## 2022-07-06 DIAGNOSIS — D509 Iron deficiency anemia, unspecified: Secondary | ICD-10-CM | POA: Diagnosis present

## 2022-07-06 DIAGNOSIS — E1122 Type 2 diabetes mellitus with diabetic chronic kidney disease: Secondary | ICD-10-CM | POA: Diagnosis present

## 2022-07-06 DIAGNOSIS — N401 Enlarged prostate with lower urinary tract symptoms: Secondary | ICD-10-CM | POA: Diagnosis present

## 2022-07-06 DIAGNOSIS — Z7985 Long-term (current) use of injectable non-insulin antidiabetic drugs: Secondary | ICD-10-CM

## 2022-07-06 DIAGNOSIS — L89152 Pressure ulcer of sacral region, stage 2: Secondary | ICD-10-CM | POA: Diagnosis present

## 2022-07-06 DIAGNOSIS — J9811 Atelectasis: Secondary | ICD-10-CM | POA: Diagnosis present

## 2022-07-06 DIAGNOSIS — K5900 Constipation, unspecified: Secondary | ICD-10-CM | POA: Diagnosis present

## 2022-07-06 DIAGNOSIS — K449 Diaphragmatic hernia without obstruction or gangrene: Secondary | ICD-10-CM | POA: Diagnosis not present

## 2022-07-06 DIAGNOSIS — Z7902 Long term (current) use of antithrombotics/antiplatelets: Secondary | ICD-10-CM

## 2022-07-06 DIAGNOSIS — I482 Chronic atrial fibrillation, unspecified: Secondary | ICD-10-CM | POA: Diagnosis present

## 2022-07-06 DIAGNOSIS — Z79899 Other long term (current) drug therapy: Secondary | ICD-10-CM

## 2022-07-06 DIAGNOSIS — I10 Essential (primary) hypertension: Secondary | ICD-10-CM | POA: Diagnosis present

## 2022-07-06 DIAGNOSIS — I3139 Other pericardial effusion (noninflammatory): Secondary | ICD-10-CM | POA: Diagnosis present

## 2022-07-06 DIAGNOSIS — F0280 Dementia in other diseases classified elsewhere without behavioral disturbance: Secondary | ICD-10-CM | POA: Diagnosis present

## 2022-07-06 DIAGNOSIS — J439 Emphysema, unspecified: Secondary | ICD-10-CM | POA: Diagnosis present

## 2022-07-06 DIAGNOSIS — J90 Pleural effusion, not elsewhere classified: Secondary | ICD-10-CM | POA: Diagnosis present

## 2022-07-06 DIAGNOSIS — I480 Paroxysmal atrial fibrillation: Secondary | ICD-10-CM | POA: Diagnosis present

## 2022-07-06 DIAGNOSIS — E119 Type 2 diabetes mellitus without complications: Secondary | ICD-10-CM

## 2022-07-06 DIAGNOSIS — I4891 Unspecified atrial fibrillation: Secondary | ICD-10-CM | POA: Diagnosis present

## 2022-07-06 DIAGNOSIS — R0600 Dyspnea, unspecified: Secondary | ICD-10-CM

## 2022-07-06 DIAGNOSIS — L8990 Pressure ulcer of unspecified site, unspecified stage: Secondary | ICD-10-CM | POA: Insufficient documentation

## 2022-07-06 DIAGNOSIS — N138 Other obstructive and reflux uropathy: Secondary | ICD-10-CM | POA: Diagnosis present

## 2022-07-06 DIAGNOSIS — G309 Alzheimer's disease, unspecified: Secondary | ICD-10-CM | POA: Diagnosis present

## 2022-07-06 DIAGNOSIS — R0602 Shortness of breath: Secondary | ICD-10-CM | POA: Diagnosis not present

## 2022-07-06 DIAGNOSIS — K420 Umbilical hernia with obstruction, without gangrene: Secondary | ICD-10-CM | POA: Diagnosis not present

## 2022-07-06 LAB — CBC WITH DIFFERENTIAL/PLATELET
Abs Immature Granulocytes: 0.02 10*3/uL (ref 0.00–0.07)
Basophils Absolute: 0 10*3/uL (ref 0.0–0.1)
Basophils Relative: 1 %
Eosinophils Absolute: 0.1 10*3/uL (ref 0.0–0.5)
Eosinophils Relative: 1 %
HCT: 29.7 % — ABNORMAL LOW (ref 39.0–52.0)
Hemoglobin: 9.5 g/dL — ABNORMAL LOW (ref 13.0–17.0)
Immature Granulocytes: 0 %
Lymphocytes Relative: 14 %
Lymphs Abs: 0.9 10*3/uL (ref 0.7–4.0)
MCH: 25 pg — ABNORMAL LOW (ref 26.0–34.0)
MCHC: 32 g/dL (ref 30.0–36.0)
MCV: 78.2 fL — ABNORMAL LOW (ref 80.0–100.0)
Monocytes Absolute: 0.7 10*3/uL (ref 0.1–1.0)
Monocytes Relative: 9 %
Neutro Abs: 5.2 10*3/uL (ref 1.7–7.7)
Neutrophils Relative %: 75 %
Platelets: 152 10*3/uL (ref 150–400)
RBC: 3.8 MIL/uL — ABNORMAL LOW (ref 4.22–5.81)
RDW: 17.4 % — ABNORMAL HIGH (ref 11.5–15.5)
WBC: 6.9 10*3/uL (ref 4.0–10.5)
nRBC: 0 % (ref 0.0–0.2)

## 2022-07-06 LAB — HEMOGLOBIN A1C
Hgb A1c MFr Bld: 9.2 % — ABNORMAL HIGH (ref 4.8–5.6)
Mean Plasma Glucose: 217.34 mg/dL

## 2022-07-06 LAB — BRAIN NATRIURETIC PEPTIDE: B Natriuretic Peptide: 365 pg/mL — ABNORMAL HIGH (ref 0.0–100.0)

## 2022-07-06 LAB — MAGNESIUM: Magnesium: 2.3 mg/dL (ref 1.7–2.4)

## 2022-07-06 LAB — RESP PANEL BY RT-PCR (RSV, FLU A&B, COVID)  RVPGX2
Influenza A by PCR: NEGATIVE
Influenza B by PCR: NEGATIVE
Resp Syncytial Virus by PCR: NEGATIVE
SARS Coronavirus 2 by RT PCR: NEGATIVE

## 2022-07-06 LAB — COMPREHENSIVE METABOLIC PANEL
ALT: 8 U/L (ref 0–44)
AST: 18 U/L (ref 15–41)
Albumin: 3 g/dL — ABNORMAL LOW (ref 3.5–5.0)
Alkaline Phosphatase: 55 U/L (ref 38–126)
Anion gap: 6 (ref 5–15)
BUN: 27 mg/dL — ABNORMAL HIGH (ref 8–23)
CO2: 25 mmol/L (ref 22–32)
Calcium: 7.6 mg/dL — ABNORMAL LOW (ref 8.9–10.3)
Chloride: 105 mmol/L (ref 98–111)
Creatinine, Ser: 1.79 mg/dL — ABNORMAL HIGH (ref 0.61–1.24)
GFR, Estimated: 34 mL/min — ABNORMAL LOW (ref >60)
Glucose, Bld: 194 mg/dL — ABNORMAL HIGH (ref 70–99)
Potassium: 4.5 mmol/L (ref 3.5–5.1)
Sodium: 136 mmol/L (ref 135–145)
Total Bilirubin: 0.4 mg/dL (ref 0.3–1.2)
Total Protein: 6.2 g/dL — ABNORMAL LOW (ref 6.5–8.1)

## 2022-07-06 LAB — PROTIME-INR
INR: 1.6 — ABNORMAL HIGH (ref 0.8–1.2)
Prothrombin Time: 18.6 seconds — ABNORMAL HIGH (ref 11.4–15.2)

## 2022-07-06 LAB — LACTIC ACID, PLASMA
Lactic Acid, Venous: 1.3 mmol/L (ref 0.5–1.9)
Lactic Acid, Venous: 3.3 mmol/L (ref 0.5–1.9)

## 2022-07-06 LAB — CBG MONITORING, ED: Glucose-Capillary: 139 mg/dL — ABNORMAL HIGH (ref 70–99)

## 2022-07-06 LAB — TROPONIN I (HIGH SENSITIVITY)
Troponin I (High Sensitivity): 16 ng/L (ref <18)
Troponin I (High Sensitivity): 15 ng/L (ref <18)

## 2022-07-06 LAB — GLUCOSE, CAPILLARY: Glucose-Capillary: 94 mg/dL (ref 70–99)

## 2022-07-06 LAB — TSH: TSH: 4.549 u[IU]/mL — ABNORMAL HIGH (ref 0.350–4.500)

## 2022-07-06 MED ORDER — ACETAMINOPHEN 325 MG PO TABS: 650.0000 mg | ORAL_TABLET | Freq: Four times a day (QID) | ORAL | Status: DC | PRN

## 2022-07-06 MED ORDER — EMPAGLIFLOZIN 10 MG PO TABS
10.0000 mg | ORAL_TABLET | Freq: Every morning | ORAL | Status: DC
  Filled 2022-07-06: qty 1

## 2022-07-06 MED ORDER — RIVAROXABAN 15 MG PO TABS
15.0000 mg | ORAL_TABLET | Freq: Every day | ORAL | Status: DC
  Administered 2022-07-06 – 2022-07-09 (×4): 15 mg via ORAL
  Filled 2022-07-06 (×4): qty 1

## 2022-07-06 MED ORDER — FUROSEMIDE 10 MG/ML IJ SOLN
20.0000 mg | Freq: Once | INTRAMUSCULAR | Status: AC
  Administered 2022-07-06: 20 mg via INTRAVENOUS
  Filled 2022-07-06: qty 4

## 2022-07-06 MED ORDER — SODIUM CHLORIDE 0.9% FLUSH: 3.0000 mL | INTRAVENOUS | Status: DC | PRN

## 2022-07-06 MED ORDER — DIVALPROEX SODIUM 125 MG PO CSDR
125.0000 mg | DELAYED_RELEASE_CAPSULE | Freq: Two times a day (BID) | ORAL | Status: DC
  Administered 2022-07-06 – 2022-07-10 (×8): 125 mg via ORAL
  Filled 2022-07-06 (×8): qty 1

## 2022-07-06 MED ORDER — FERROUS SULFATE 325 (65 FE) MG PO TABS: 325.0000 mg | ORAL_TABLET | ORAL | Status: DC

## 2022-07-06 MED ORDER — DOCUSATE SODIUM 100 MG PO CAPS
100.0000 mg | ORAL_CAPSULE | ORAL | Status: DC
  Administered 2022-07-06: 100 mg via ORAL
  Filled 2022-07-06: qty 1

## 2022-07-06 MED ORDER — FINASTERIDE 5 MG PO TABS
5.0000 mg | ORAL_TABLET | Freq: Every day | ORAL | Status: DC
  Administered 2022-07-06 – 2022-07-10 (×5): 5 mg via ORAL
  Filled 2022-07-06 (×5): qty 1

## 2022-07-06 MED ORDER — SODIUM CHLORIDE 0.9 % IV SOLN: 250.0000 mL | INTRAVENOUS | Status: DC | PRN

## 2022-07-06 MED ORDER — MELATONIN 3 MG PO TABS
3.0000 mg | ORAL_TABLET | Freq: Every day | ORAL | Status: DC
  Administered 2022-07-06 – 2022-07-09 (×4): 3 mg via ORAL
  Filled 2022-07-06 (×4): qty 1

## 2022-07-06 MED ORDER — METOPROLOL SUCCINATE ER 25 MG PO TB24
25.0000 mg | ORAL_TABLET | Freq: Every day | ORAL | Status: DC
  Administered 2022-07-06 – 2022-07-10 (×5): 25 mg via ORAL
  Filled 2022-07-06 (×5): qty 1

## 2022-07-06 MED ORDER — POLYETHYLENE GLYCOL 3350 17 G PO PACK
17.0000 g | PACK | Freq: Every day | ORAL | Status: DC | PRN
  Administered 2022-07-06 – 2022-07-08 (×2): 17 g via ORAL
  Filled 2022-07-06 (×2): qty 1

## 2022-07-06 MED ORDER — PANTOPRAZOLE SODIUM 40 MG PO TBEC
40.0000 mg | DELAYED_RELEASE_TABLET | Freq: Every day | ORAL | Status: DC
  Administered 2022-07-06 – 2022-07-10 (×5): 40 mg via ORAL
  Filled 2022-07-06 (×5): qty 1

## 2022-07-06 MED ORDER — INSULIN ASPART 100 UNIT/ML IJ SOLN
0.0000 [IU] | Freq: Three times a day (TID) | INTRAMUSCULAR | Status: DC
  Administered 2022-07-06: 1 [IU] via SUBCUTANEOUS
  Administered 2022-07-07: 2 [IU] via SUBCUTANEOUS
  Filled 2022-07-06: qty 0.09

## 2022-07-06 MED ORDER — SODIUM CHLORIDE 0.9% FLUSH
3.0000 mL | Freq: Two times a day (BID) | INTRAVENOUS | Status: DC
  Administered 2022-07-06 – 2022-07-10 (×7): 3 mL via INTRAVENOUS

## 2022-07-06 MED ORDER — LATANOPROST 0.005 % OP SOLN
1.0000 [drp] | Freq: Every day | OPHTHALMIC | Status: DC
  Administered 2022-07-06 – 2022-07-09 (×4): 1 [drp] via OPHTHALMIC
  Filled 2022-07-06: qty 2.5

## 2022-07-06 MED ORDER — TAMSULOSIN HCL 0.4 MG PO CAPS
0.4000 mg | ORAL_CAPSULE | Freq: Every day | ORAL | Status: DC
  Administered 2022-07-06 – 2022-07-10 (×5): 0.4 mg via ORAL
  Filled 2022-07-06 (×5): qty 1

## 2022-07-06 MED ORDER — QUETIAPINE FUMARATE 50 MG PO TABS: 25.0000 mg | ORAL_TABLET | Freq: Every day | ORAL | Status: DC

## 2022-07-06 MED ORDER — ACETAMINOPHEN 650 MG RE SUPP: 650.0000 mg | Freq: Four times a day (QID) | RECTAL | Status: DC | PRN

## 2022-07-06 NOTE — Assessment & Plan Note (Addendum)
Moderate left pleural effusion and small to moderate right pleural effusion Echo pending for pericardial effusion  May benefit from thoracentesis to help with symptoms pending echo  Requiring no oxygen and vitals stable Repeat CXR tomorrow

## 2022-07-06 NOTE — Assessment & Plan Note (Signed)
PPM interrogation ordered  Telemetry

## 2022-07-06 NOTE — Assessment & Plan Note (Signed)
Rate controlled.  Continue toprol-xl and xarelto

## 2022-07-06 NOTE — Assessment & Plan Note (Addendum)
Stable at baseline Continue to monitor  Continue oral iron every other day

## 2022-07-06 NOTE — Progress Notes (Signed)
Critical lab Lactic Acid 3.3  Provider notified.

## 2022-07-06 NOTE — Assessment & Plan Note (Addendum)
89 year old male presenting with one week history of dyspnea on exertion, fatigue, LE edema, cough found to have large left pleural effusion and elevated BNP consistent with acute on chronic diastolic CHF.  -obs to progressive -strict I/O -daily weights -echo 12/2021: EF of 55-60% normal LVF. Diastolic indeterminate. Mild reduction in RVF. Small pericardial effusion present.  -echo asap with possible moderate pericardial effusion. Cardiology consulted, vitals stable  -given 10m of lasix, will monitor I/O and wait for to evaluate pericardial effusion before further diuresis  -no evidence of pulmonary edema  -may benefit from thoracentesis  -f/u on PPM interrogation  -continue beta blocker/jardiance

## 2022-07-06 NOTE — ED Triage Notes (Signed)
Pt arrived via POV with family. C/o SOB x2 days and constipation x4 days. Denies any CP.

## 2022-07-06 NOTE — ED Notes (Signed)
ED TO INPATIENT HANDOFF REPORT  ED Nurse Name and Phone #:   S Name/Age/Gender [name] 89 y.o. male Room/Bed: WA11/WA11  Code Status   Code Status: Full Code  Home/SNF/Other Home Patient oriented to: self, place, time, and situation Is this baseline? Yes   Triage Complete: Triage complete  Chief Complaint Pleural effusion [J90]  Triage Note Pt arrived via [place] with family. C/o SOB x2 days and constipation x4 days. Denies any CP.    Allergies No Known Allergies  Level of Care/Admitting Diagnosis ED Disposition     ED Disposition  Admit   Condition  --   Comment  Hospital Area: [facility] [[id number]]  Level of Care: Progressive [102]  Admit to Progressive based on following criteria: CARDIOVASCULAR & THORACIC of moderate stability with acute coronary syndrome symptoms/low risk myocardial infarction/hypertensive urgency/arrhythmias/heart failure potentially compromising stability and stable post cardiovascular intervention patients.  May place patient in observation at [facility] or [facility] if equivalent level of care is available:: Yes  Covid Evaluation: Confirmed COVID Negative  Diagnosis: Pleural effusion [242230]  Admitting Physician: Orma Flaming [id number]  Attending Physician: Orma Flaming [id number]          B Medical/Surgery History Past Medical History:  Diagnosis Date   Arthritis    Atrial fibrillation ([place])    CKD (chronic kidney disease)    History of hiatal hernia    Hx SBO    Last 2013 all treated conservatively   Hypertension    Presence of permanent cardiac pacemaker    Prostate cancer ([place])    Stroke ([place]) 2017   left facial drooping noted 08/14/2016   Tachycardia-bradycardia syndrome ([place])    Type II diabetes mellitus ([place])    Ventral hernia    x3   Past Surgical History:  Procedure Laterality Date   ABDOMINAL AORTOGRAM W/LOWER EXTREMITY N/A 08/07/2016   Procedure: Abdominal Aortogram w/Lower  Extremity;  Surgeon: Wellington Hampshire, MD;  Location: [facility];  Service: Cardiovascular;  Laterality: N/A;   CHOLECYSTECTOMY OPEN  03/31/2001   Dr Lindon Romp   COLON SURGERY     EXPLORATORY LAPAROTOMY  08/2004   Archie Endo 10/01/2010   HEMORRHOID SURGERY     HERNIA REPAIR     HIATAL HERNIA REPAIR  2002   INCISIONAL HERNIA REPAIR  08/2004   Archie Endo 10/01/2010   INSERT / REPLACE / REMOVE PACEMAKER     PERIPHERAL VASCULAR BALLOON ANGIOPLASTY  08/07/2016   Procedure: Peripheral Vascular Balloon Angioplasty;  Surgeon: Wellington Hampshire, MD;  Location: [facility];  Service: Cardiovascular;;  left popliteal artery   PERIPHERAL VASCULAR INTERVENTION  08/14/2016   popliteal artery/notes 08/14/2016   PERIPHERAL VASCULAR INTERVENTION Left 08/14/2016   Procedure: Peripheral Vascular Intervention;  Surgeon: Wellington Hampshire, MD;  Location: [facility];  Service: Cardiovascular;  Laterality: Left;  popliteal artery   PERMANENT PACEMAKER GENERATOR CHANGE N/A 04/06/2012   Procedure: PERMANENT PACEMAKER GENERATOR CHANGE;  Surgeon: Evans Lance, MD;  Location: [facility] C[facility];  Service: Cardiovascular;  Laterality: N/A;   PPM GENERATOR CHANGEOUT N/A 02/04/2022   Procedure: PPM GENERATOR CHANGEOUT;  Surgeon: Evans Lance, MD;  Location: [facility];  Service: Cardiovascular;  Laterality: N/A;   SMALL INTESTINE SURGERY  09/16/2004   SBO resection, VH repair     A IV Location/Drains/Wounds Patient Lines/Drains/Airways Status     Active Line/Drains/Airways     Name Placement date Placement time Site Days  Peripheral IV 07/06/22 20 G Anterior;Distal;Left;Upper Arm 07/06/22  1252  Arm  less than 1   Peripheral IV 07/06/22 20 G Anterior;Distal;Right;Upper Arm 07/06/22  1904  Arm  less than 1   External Urinary Catheter 07/06/22  1554  --  less than 1            Intake/Output Last 24 hours No intake or output data in the 24 hours ending 07/06/22 1911  Labs/Imaging Results for  orders placed or performed during the hospital encounter of 07/06/22 (from the past 48 hour(s))  Comprehensive metabolic panel     Status: Abnormal   Collection Time: 07/06/22 12:11 PM  Result Value Ref Range   Sodium 136 135 - 145 mmol/L   Potassium 4.5 3.5 - 5.1 mmol/L   Chloride 105 98 - 111 mmol/L   CO2 25 22 - 32 mmol/L   Glucose, Bld 194 (H) 70 - 99 mg/dL    Comment: Glucose reference range applies only to samples taken after fasting for at least 8 hours.   BUN 27 (H) 8 - 23 mg/dL   Creatinine, Ser 1.79 (H) 0.61 - 1.24 mg/dL   Calcium 7.6 (L) 8.9 - 10.3 mg/dL   Total Protein 6.2 (L) 6.5 - 8.1 g/dL   Albumin 3.0 (L) 3.5 - 5.0 g/dL   AST 18 15 - 41 U/L   ALT 8 0 - 44 U/L   Alkaline Phosphatase 55 38 - 126 U/L   Total Bilirubin 0.4 0.3 - 1.2 mg/dL   GFR, Estimated 34 (L) >60 mL/min    Comment: (NOTE) Calculated using the CKD-EPI Creatinine Equation (2021)    Anion gap 6 5 - 15    Comment: Performed at [facility], [place] [address] [place], [place] [address]  CBC with Differential     Status: Abnormal   Collection Time: 07/06/22 12:11 PM  Result Value Ref Range   WBC 6.9 4.0 - 10.5 K/uL   RBC 3.80 (L) 4.22 - 5.81 MIL/uL   Hemoglobin 9.5 (L) 13.0 - 17.0 g/dL   HCT 29.7 (L) 39.0 - 52.0 %   MCV 78.2 (L) 80.0 - 100.0 fL   MCH 25.0 (L) 26.0 - 34.0 pg   MCHC 32.0 30.0 - 36.0 g/dL   RDW 17.4 (H) 11.5 - 15.5 %   Platelets 152 150 - 400 K/uL   nRBC 0.0 0.0 - 0.2 %   Neutrophils Relative % 75 %   Neutro Abs 5.2 1.7 - 7.7 K/uL   Lymphocytes Relative 14 %   Lymphs Abs 0.9 0.7 - 4.0 K/uL   Monocytes Relative 9 %   Monocytes Absolute 0.7 0.1 - 1.0 K/uL   Eosinophils Relative 1 %   Eosinophils Absolute 0.1 0.0 - 0.5 K/uL   Basophils Relative 1 %   Basophils Absolute 0.0 0.0 - 0.1 K/uL   Immature Granulocytes 0 %   Abs Immature Granulocytes 0.02 0.00 - 0.07 K/uL    Comment: Performed at [facility], [place] [address], [place], [place] [address]   Resp panel by RT-PCR (RSV, Flu A&B, Covid) Anterior Nasal Swab     Status: None   Collection Time: 07/06/22 12:12 PM   Specimen: Anterior Nasal Swab  Result Value Ref Range   SARS Coronavirus 2 by RT PCR NEGATIVE NEGATIVE    Comment: (NOTE) SARS-CoV-2 target nucleic acids are NOT DETECTED.  The SARS-CoV-2 RNA is generally detectable in upper respiratory specimens during the acute phase of infection. The lowest  concentration of SARS-CoV-2 viral copies this assay can detect is 138 copies/mL. A negative result does not preclude SARS-Cov-2 infection and should not be used as the sole basis for treatment or other patient management decisions. A negative result may occur with  improper specimen collection/handling, submission of specimen other than nasopharyngeal swab, presence of viral mutation(s) within the areas targeted by this assay, and inadequate number of viral copies(<138 copies/mL). A negative result must be combined with clinical observations, patient history, and epidemiological information. The expected result is Negative.  Fact Sheet for Patients:  EntrepreneurPulse.com.au  Fact Sheet for Healthcare Providers:  IncredibleEmployment.be  This test is no t yet approved or cleared by the [place] FDA and  has been authorized for detection and/or diagnosis of SARS-CoV-2 by FDA under an Emergency Use Authorization (EUA). This EUA will remain  in effect (meaning this test can be used) for the duration of the COVID-19 declaration under Section 564(b)(1) of the Act, 21 U.S.C.section 360bbb-3(b)(1), unless the authorization is terminated  or revoked sooner.       Influenza A by PCR NEGATIVE NEGATIVE   Influenza B by PCR NEGATIVE NEGATIVE    Comment: (NOTE) The Xpert Xpress SARS-CoV-2/FLU/RSV plus assay is intended as an aid in the diagnosis of influenza from Nasopharyngeal swab specimens and should not be used as a sole basis for  treatment. Nasal washings and aspirates are unacceptable for Xpert Xpress SARS-CoV-2/FLU/RSV testing.  Fact Sheet for Patients: EntrepreneurPulse.com.au  Fact Sheet for Healthcare Providers: IncredibleEmployment.be  This test is not yet approved or cleared by the [place] FDA and has been authorized for detection and/or diagnosis of SARS-CoV-2 by FDA under an Emergency Use Authorization (EUA). This EUA will remain in effect (meaning this test can be used) for the duration of the COVID-19 declaration under Section 564(b)(1) of the Act, 21 U.S.C. section 360bbb-3(b)(1), unless the authorization is terminated or revoked.     Resp Syncytial Virus by PCR NEGATIVE NEGATIVE    Comment: (NOTE) Fact Sheet for Patients: EntrepreneurPulse.com.au  Fact Sheet for Healthcare Providers: IncredibleEmployment.be  This test is not yet approved or cleared by the [place] FDA and has been authorized for detection and/or diagnosis of SARS-CoV-2 by FDA under an Emergency Use Authorization (EUA). This EUA will remain in effect (meaning this test can be used) for the duration of the COVID-19 declaration under Section 564(b)(1) of the Act, 21 U.S.C. section 360bbb-3(b)(1), unless the authorization is terminated or revoked.  Performed at [facility], [place] [address] [place], [place] [address]   Brain natriuretic peptide     Status: Abnormal   Collection Time: 07/06/22 12:30 PM  Result Value Ref Range   B Natriuretic Peptide 365.0 (H) 0.0 - 100.0 pg/mL    Comment: Performed at [facility], [place] [address], [place], [place] [address]  Troponin I (High Sensitivity)     Status: None   Collection Time: 07/06/22  2:09 PM  Result Value Ref Range   Troponin I (High Sensitivity) 16 <18 ng/L    Comment: (NOTE) Elevated high sensitivity troponin I (hsTnI) values and significant  changes  across serial measurements may suggest ACS but many other  chronic and acute conditions are known to elevate hsTnI results.  Refer to the "Links" section for chest pain algorithms and additional  guidance. Performed at [facility], [place] [address], [place], [place] [address]   Lactic acid, plasma     Status: None   Collection Time: 07/06/22  4:27 PM  Result Value Ref Range   Lactic Acid, Venous 1.3 0.5 - 1.9 mmol/L    Comment: Performed at [facility], [place] [address], [place], [place] [address]  Troponin I (High Sensitivity)     Status: None   Collection Time: 07/06/22  4:27 PM  Result Value Ref Range   Troponin I (High Sensitivity) 15 <18 ng/L    Comment: (NOTE) Elevated high sensitivity troponin I (hsTnI) values and significant  changes across serial measurements may suggest ACS but many other  chronic and acute conditions are known to elevate hsTnI results.  Refer to the "Links" section for chest pain algorithms and additional  guidance. Performed at [facility], [place] [address] [place], [place] [address]   Magnesium     Status: None   Collection Time: 07/06/22  5:54 PM  Result Value Ref Range   Magnesium 2.3 1.7 - 2.4 mg/dL    Comment: Performed at [facility], [place] [address], [place], [place] [address]  CBG monitoring, ED     Status: Abnormal   Collection Time: 07/06/22  6:30 PM  Result Value Ref Range   Glucose-Capillary 139 (H) 70 - 99 mg/dL    Comment: Glucose reference range applies only to samples taken after fasting for at least 8 hours.   CT CHEST ABDOMEN PELVIS WO CONTRAST  Result Date: 07/06/2022 CLINICAL DATA:  Abdominal pain. No bowel movement for 4 days. Also with shortness of breath and pleural effusions. History of small-bowel obstruction. EXAM: CT CHEST, ABDOMEN AND PELVIS WITHOUT CONTRAST TECHNIQUE: Multidetector CT imaging of the chest, abdomen and pelvis was performed following  the standard protocol without IV contrast. RADIATION DOSE REDUCTION: This exam was performed according to the departmental dose-optimization program which includes automated exposure control, adjustment of the mA and/or kV according to patient size and/or use of iterative reconstruction technique. COMPARISON:  07/29/2021.  Current chest radiograph. FINDINGS: CT CHEST FINDINGS Cardiovascular: Heart top-normal in size. Moderate pericardial effusion. Three-vessel coronary artery calcifications. Right ventricle and right atrial pacer leads are well positioned. Thoracic aorta normal in caliber. Mildly enlarged main pulmonary arteries, right 3 cm, left 3.2 cm. Mild aortic atherosclerosis. Mediastinum/Nodes: No neck base, mediastinal or hilar masses. No enlarged lymph nodes. Trachea unremarkable. Mild distension of the esophagus. Small hiatal hernia. Lungs/Pleura: Moderate left and small to moderate right pleural effusions. Dependent lung opacities, most evident in the left lower lobe, consistent with atelectasis. No convincing pneumonia. No pulmonary edema. Airways widely patent. 2-3 mm nodule, right upper lobe, image 58/6. Several small right lung calcified granuloma. Mild to moderate centrilobular and paraseptal emphysema. No pneumothorax. Musculoskeletal: No acute fractures. Old posterior left rib fractures. No bone lesion. No chest wall mass. CT ABDOMEN PELVIS FINDINGS Hepatobiliary: No focal liver abnormality is seen. Status post cholecystectomy. No biliary dilatation. Pancreas: Atrophic pancreas with calcifications, but no convincing mass or active inflammation. Duct dilation. Appearance similar to the prior CT. Spleen: Lobulated, relatively small spleen. No splenic mass. Stable appearance. Adrenals/Urinary Tract: No adrenal mass. Right renal cortical thinning. Normal left renal size. Multiple low-attenuation right renal masses consistent with cysts, stable with no follow-up recommended. No stones. No  hydronephrosis. Ureters normal in course and in caliber. Bladder minimally distended, otherwise unremarkable. Stomach/Bowel: Right anterior abdominal wall hernias, 2 adjacent hernias in the right mid to upper abdomen for lateral with a base of 2 cm, more medial with a base of approximately 4 cm. Third hernia right periumbilical, base proximally 2.6  cm. All 3 hernias contain bowel. The smaller right upper abdominal wall hernia contains a knuckle of the right transverse colon. The other 2 hernias contain portions of small bowel. Small bowel distal to the right paraumbilical hernia is decompressed. Small bowel within the 2 hernias and proximal to the hernia in the right mid to lower abdomen is dilated maximum approximately 3.8 cm. No small bowel wall thickening or adjacent inflammation. Small bowel anastomosis staple line noted right pelvis, stable from the prior CT. Vascular/Lymphatic: Aortic atherosclerosis. Low-attenuation mass versus a retrocrural lymph node lies to the right of the upper aorta just below the aortic hiatus, 2.5 x 2.1 cm transversely, unchanged from the prior CT. This may reflect a lymphocele. No definite enlarged lymph nodes. Reproductive: Small prostate, stable. Other: Diffuse subcutaneous edema.  No ascites. Musculoskeletal: No fracture or acute finding.  No bone lesion. IMPRESSION: 1. Moderate pericardial effusion. Moderate left and small to moderate right pleural effusions. Associated dependent atelectasis most evident in the left lower lobe. 2. No convincing pneumonia and no evidence of pulmonary edema. 3. 3 mm right upper lobe pulmonary nodule. Given the patient's age, in the presence multiple small calcified granuloma, no follow-up recommended. 4. Emphysema. Three anterior abdominal wall hernias, which were present on the prior CT. To hernias contain small bowel with evidence of partial obstruction at the level of the right periumbilical hernia. Small bowel obstructive changes are less  pronounced than on the prior CT. No evidence of bowel wall edema or inflammation. 5. No other acute abnormality within the abdomen or pelvis. 6. Nonspecific diffuse subcutaneous soft tissue edema. 7. Other chronic findings as detailed stable from the prior CT. Electronically Signed   By: Lajean Manes M.D.   On: 07/06/2022 14:01   DG Chest Port 1 View  Result Date: 07/06/2022 CLINICAL DATA:  Short of breath. EXAM: PORTABLE CHEST 1 VIEW COMPARISON:  03/09/2022. FINDINGS: Moderate left and small right pleural effusions obscure the hemidiaphragms, part of the right and most of the left cardiac borders. Additional opacity at the lung bases is likely atelectasis. Mid to upper right and upper left lungs are clear. No pneumothorax. Cardiac silhouette is mostly obscured. 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 No convincing mediastinal or hilar masses. Skeletal structures are grossly intact. IMPRESSION: 1. Moderate left and small right pleural effusions associated with lung base opacity, the latter finding consistent with atelectasis. Consider pneumonia if there are consistent clinical findings. No evidence of pulmonary edema. 2. Pleural effusions have significantly increased when compared to the prior chest radiograph. Electronically Signed   By: Lajean Manes M.D.   On: 07/06/2022 13:04    Pending Labs Unresulted Labs (From admission, onward)     Start     Ordered   07/07/22 [id number]  Basic metabolic panel  Tomorrow morning,   R        07/06/22 1629   07/07/22 0500  CBC  Tomorrow morning,   R        07/06/22 1629   07/06/22 1630  TSH  Once,   R        07/06/22 1629   07/06/22 1630  Protime-INR  Once,   R        07/06/22 1629   07/06/22 1537  Hemoglobin A1c  Once,   R       Comments: To assess prior glycemic control    07/06/22 1536   07/06/22 1311  Lactic acid, plasma  Now then every 2 hours,  R (with STAT occurrences)      07/06/22 1310            Vitals/Pain Today's Vitals    07/06/22 1202 07/06/22 1635 07/06/22 1800  BP: 114/68  (!) 141/88  Pulse: 65    Resp: 14  16  Temp: (!) 97.4 F (36.3 C) (!) 97.5 F (36.4 C)   TempSrc: Axillary Oral   SpO2: 100%      Isolation Precautions No active isolations  Medications Medications  insulin aspart (novoLOG) injection 0-9 Units (1 Units Subcutaneous Given 07/06/22 1837)  sodium chloride flush (NS) 0.9 % injection 3 mL (has no administration in time range)  sodium chloride flush (NS) 0.9 % injection 3 mL (has no administration in time range)  0.9 %  sodium chloride infusion (has no administration in time range)  acetaminophen (TYLENOL) tablet 650 mg (has no administration in time range)    Or  acetaminophen (TYLENOL) suppository 650 mg (has no administration in time range)  QUEtiapine (SEROQUEL) tablet 25 mg (has no administration in time range)  empagliflozin (JARDIANCE) tablet 10 mg (has no administration in time range)  docusate sodium (COLACE) capsule 100 mg (has no administration in time range)  pantoprazole (PROTONIX) EC tablet 40 mg (has no administration in time range)  polyethylene glycol powder (GLYCOLAX/MIRALAX) container 17 g (has no administration in time range)  finasteride (PROSCAR) tablet 5 mg (has no administration in time range)  tamsulosin (FLOMAX) capsule 0.4 mg (has no administration in time range)  Rivaroxaban (XARELTO) tablet 15 mg (has no administration in time range)  Melatonin TBDP 1 tablet (has no administration in time range)  divalproex (DEPAKOTE SPRINKLE) capsule 125 mg (has no administration in time range)  latanoprost (XALATAN) 0.005 % ophthalmic solution 1 drop (has no administration in time range)  Iron (Ferrous Sulfate) TABS 1 tablet (has no administration in time range)  metoprolol succinate (TOPROL-XL) 24 hr tablet 25 mg (has no administration in time range)  furosemide (LASIX) injection 20 mg (20 mg Intravenous Given 07/06/22 1526)    Mobility manual wheelchair      Focused Assessments    R Recommendations: See Admitting Provider Note  Report given to:   Additional Notes:

## 2022-07-06 NOTE — ED Notes (Signed)
Amy from [employer] called to say that she will be faxing over the data from the interrogation of the pacemaker

## 2022-07-06 NOTE — Assessment & Plan Note (Signed)
A1C in 11/2021: 7.0 Update A1c Hold actos, continue jardiance SSI and accuchecks qac/hs

## 2022-07-06 NOTE — ED Provider Triage Note (Addendum)
Emergency Medicine Provider Triage Evaluation Note  [name] , a 89 y.o. male  was evaluated in triage.  Pt complains of shortness of breath and constipation.  Patient reports he has shortness of breath that began about 2 days ago without any specific triggering on side.  Patient reports that he is on the right knee no known sick contacts.  No prior history of congestive heart failure.  Patient is currently on a blood thinner.  Patient also reports about 4 days of constipation and this appears to be chronic in nature as patient does have docusate prescribed but has not taken this or any other medications to assist the constipation..  Review of Systems  Positive: As above Negative: As above  Physical Exam  BP 114/68 (BP Location: Left Arm)   Pulse 65   Temp (!) 97.4 F (36.3 C) (Axillary)   Resp 14   SpO2 100%  Gen:   Awake, no distress   Resp:  Normal effort, mild wheezing noted with left worse than right MSK:   Moves extremities without difficulty  Other:  Notable ventral hernia  Medical Decision Making  Medically screening exam initiated at 12:11 PM.  Appropriate orders placed.  [name] was informed that the remainder of the evaluation will be completed by another provider, this initial triage assessment does not replace that evaluation, and the importance of remaining in the ED until their evaluation is complete.     Matthew Heller, PA-C 07/06/22 1212    Matthew Heller, PA-C 07/06/22 1305

## 2022-07-06 NOTE — Assessment & Plan Note (Signed)
Well controlled  Continue toprol-xl 25mg daily  

## 2022-07-06 NOTE — ED Notes (Addendum)
PT ISN'T IN THE LOBBY, DAUGHTER PER REGISTER HIM SPOKE WITH DAUGHTER AND SHE STATED HE IS ON THE WAY

## 2022-07-06 NOTE — Assessment & Plan Note (Signed)
Baseline creatinine 1.8-2.2, stable Strict I/O Monitor with diuresis

## 2022-07-06 NOTE — H&P (Addendum)
History and Physical    Patient: [name] [id number] DOB: 12/01/1923 DOA: 07/06/2022 DOS: the patient was seen and examined on 07/06/2022 PCP: [name], MD  Patient coming from: Home - lives alone, but has aides. Uses cane    Chief Complaint: shortness of breath   HPI: [name] is a 89 y.o. male with medical history significant of atrial fibrillation, CKD stage 3b, hx of prostate cancer, HTN, PPM, hx ov CVA, [id number], diastolic CHF, Alzheimer dementia, HTN,  who presented to ED with complaints of shortness of breath/dyspnea on exertion x 1 week, but shortness of breath worse over the past 2 days. He also has decreased PO intake over the past 3 days.   History from patient and his granddaughter. Home aide came yesterday and he wouldn't tell her anything.  He did tell his granddaughter's husband he has been short of breath. He has dyspnea on exertion and requires frequent rests that has been going on x 1 week.  She states he will gasp for air when he is walking. This week he didn't go anywhere. His leg swelling started at the beginning of January and hasn't changed much. He has no cough. He does consume salt in his diet. No orthopnea, weight gain since July was 126>158 pounds. Granddaughter states he needed to gain weight though. Does report a mild cough that can be productive at times. He also complains of discomfort in his stomach. Has not had a BM since Sunday. He has had flatus.   History of SBO in 3/23, conservatively management. No hx of surgery on his ventral hernias.   Denies any fever/chills, vision changes/headaches, chest pain or palpitations,  N/V/D, dysuria or leg swelling from baseline. He states he is constipated.    Does not smoke or drink alcohol.   ER Course:  vitals: afebrile, bp: 114/68, HR: 65, RR: 14, oxygen: 100%RA Pertinent labs: hgb: 9.5 (baseline: 8-9), BUN: 27, creatinine: 1.79, covid/flu/rsv: negative, bnp: 365,  CXR: . Moderate left and  small right pleural effusions associated with lung base opacity, the latter finding consistent with atelectasis. Consider pneumonia if there are consistent clinical findings. No evidence of pulmonary edema. 2. Pleural effusions have significantly increased when compared to the prior chest radiograph. CT abdomen/pelvis: moderate pericardial effusion. Moderate left and small to moderate right pleural effusion. Dependent atelectasis most evident in LLL.  No pneumonia, or pulmonary edema. 78m  RUL pulmonary nodule. No f/u recommended Emphysema  Three anterior abdominal wall hernias, which were present on the prior CT. To hernias contain small bowel with evidence of partial obstruction at the level of the right periumbilical hernia. Small bowel obstructive changes are less pronounced than on the prior CT. No evidence of bowel wall edema or inflammation. In ED: cardiology consulted. Given lasix 244m TRH asked to admit. PPM interrogated.    Review of Systems: As mentioned in the history of present illness. All other systems reviewed and are negative. Past Medical History:  Diagnosis Date   Arthritis    Atrial fibrillation ([facility]   CKD (chronic kidney disease)    History of hiatal hernia    Hx SBO    Last 2013 all treated conservatively   Hypertension    Presence of permanent cardiac pacemaker    Prostate cancer ([facility]   St[place] ([facility]   left facial drooping noted 08/14/2016   Tachycardia-bradycardia syndrome ([facility]   Type II diabetes mellitus ([facility]   Ventral hernia  x3   Past Surgical History:  Procedure Laterality Date   ABDOMINAL AORTOGRAM W/LOWER EXTREMITY N/A 08/07/2016   Procedure: Abdominal Aortogram w/Lower Extremity;  Surgeon: Wellington Hampshire, MD;  Location: [facility];  Service: Cardiovascular;  Laterality: N/A;   CHOLECYSTECTOMY OPEN  03/31/2001   Dr Lindon Romp   COLON SURGERY     EXPLORATORY LAPAROTOMY  08/2004   Archie Endo 10/01/2010   HEMORRHOID SURGERY      HERNIA REPAIR     HIATAL HERNIA REPAIR  2002   INCISIONAL HERNIA REPAIR  08/2004   Archie Endo 10/01/2010   INSERT / REPLACE / REMOVE PACEMAKER     PERIPHERAL VASCULAR BALLOON ANGIOPLASTY  08/07/2016   Procedure: Peripheral Vascular Balloon Angioplasty;  Surgeon: Wellington Hampshire, MD;  Location: [facility];  Service: Cardiovascular;;  left popliteal artery   PERIPHERAL VASCULAR INTERVENTION  08/14/2016   popliteal artery/notes 08/14/2016   PERIPHERAL VASCULAR INTERVENTION Left 08/14/2016   Procedure: Peripheral Vascular Intervention;  Surgeon: Wellington Hampshire, MD;  Location: [name];  Service: Cardiovascular;  Laterality: Left;  popliteal artery   PERMANENT PACEMAKER GENERATOR CHANGE N/A 04/06/2012   Procedure: PERMANENT PACEMAKER GENERATOR CHANGE;  Surgeon: Evans Lance, MD;  Location: [facility];  Service: Cardiovascular;  Laterality: N/A;   PPM GENERATOR CHANGEOUT N/A 02/04/2022   Procedure: PPM GENERATOR CHANGEOUT;  Surgeon: Evans Lance, MD;  Location: [facility];  Service: Cardiovascular;  Laterality: N/A;   SMALL INTESTINE SURGERY  09/16/2004   SBO resection, VH repair   Social History:  reports that he quit smoking about 67 years ago. His smoking use included cigarettes. He has a 22.50 pack-year smoking history. He has been exposed to tobacco smoke. He quit smokeless tobacco use about 69 years ago.  His smokeless tobacco use included chew. He reports that he does not drink alcohol and does not use drugs.  No Known Allergies  Family History  Problem Relation Age of Onset   Pneumonia Father    Stroke Brother    Stroke Sister    Cancer Brother        type unknown    Prior to Admission medications   Medication Sig Start Date End Date Taking? Authorizing Provider  divalproex (DEPAKOTE SPRINKLE) 125 MG capsule Take 1 capsule (125 mg total) by mouth 2 (two) times daily. 05/27/22 06/26/22  [name], MD  docusate sodium (COLACE) 100 MG capsule Take 1  capsule (100 mg total) by mouth every other day. 01/01/22   Geradine Girt, DO  finasteride (PROSCAR) 5 MG tablet TAKE 1 TABLET (5 MG TOTAL) BY MOUTH DAILY. 06/07/22   [name], MD  furosemide (LASIX) 20 MG tablet TAKE 1 TABLET BY MOUTH EVERY DAY 06/10/22   Evans Lance, MD  Iron, Ferrous Sulfate, 325 (65 Fe) MG TABS Take 1 tablet every other day. 06/13/22   [name], MD  JARDIANCE 10 MG TABS tablet Take 10 mg by mouth every morning. 02/11/22   [provider]  latanoprost (XALATAN) 0.005 % ophthalmic solution Place 1 drop into both eyes at bedtime. 05/03/20   [provider]  Melatonin 3 MG TBDP Take by mouth.    [provider]  metoprolol succinate (TOPROL-XL) 25 MG 24 hr tablet Take 1 tablet (25 mg total) by mouth daily. 01/02/22   Geradine Girt, DO  pantoprazole (PROTONIX) 40 MG tablet TAKE 1 TABLET(40 MG) BY MOUTH DAILY 01/01/22   Abelino Derrick  Delrae Alfred, MD  pioglitazone (ACTOS) 15 MG tablet Take 1 tablet (15 mg total) by mouth daily. 02/14/22   [name], MD  polyethylene glycol powder [facility]) 17 GM/SCOOP powder Take 17 g by mouth 2 (two) times daily as needed. 08/03/21   McElwee, Lauren A, NP  QUEtiapine (SEROQUEL) 25 MG tablet Take 25 mg by mouth at bedtime. 02/07/22   [provider]  Rivaroxaban (XARELTO) 15 MG TABS tablet TAKE 1 TABLET DAILY WITH SUPPER 05/22/22   [name], MD  tamsulosin (FLOMAX) 0.4 MG CAPS capsule Take 1 capsule (0.4 mg total) by mouth daily. 12/04/21   [name], MD    Physical Exam: Vitals:   07/06/22 1202 07/06/22 1635  BP: 114/68   Pulse: 65   Resp: 14   Temp: (!) 97.4 F (36.3 C) (!) 97.5 F (36.4 C)  TempSrc: Axillary Oral  SpO2: 100%    General:  Appears calm and comfortable and is in NAD Eyes:  PERRL, EOMI, normal lids, iris ENT:  very hard of hearing, normal lips & tongue, mmm; appropriate dentition Neck:  no LAD, masses or thyromegaly; no  carotid bruits Cardiovascular:  regularly irregular, no m/r/g. 1-2+pitting edema bilateral to below the knee.  Respiratory:  decreased breath sounds in mid to lower left lobe, otherwise normal effort, exam. No wheezing/rhonchi.  Abdomen:  soft, NT, ND, NABS. 2 large, reducible ventral  hernias Back:   normal alignment, no CVAT Skin:  erythematous, flaky rash over chin, cheeks. Otherwise no rash  Musculoskeletal:  grossly normal tone BUE/BLE, good ROM, no bony abnormality Lower extremity:   Limited foot exam with no ulcerations.  2+ distal pulses. Psychiatric:  grossly normal mood and affect, speech fluent and appropriate, AOx3 Neurologic:  CN 2-12 grossly intact, moves all extremities in coordinated fashion, sensation intact. Mild facial droop from previous stroke, at baseline.    Radiological Exams on Admission: Independently reviewed - see discussion in A/P where applicable  CT CHEST ABDOMEN PELVIS WO CONTRAST  Result Date: 07/06/2022 CLINICAL DATA:  Abdominal pain. No bowel movement for 4 days. Also with shortness of breath and pleural effusions. History of small-bowel obstruction. EXAM: CT CHEST, ABDOMEN AND PELVIS WITHOUT CONTRAST TECHNIQUE: Multidetector CT imaging of the chest, abdomen and pelvis was performed following the standard protocol without IV contrast. RADIATION DOSE REDUCTION: This exam was performed according to the departmental dose-optimization program which includes automated exposure control, adjustment of the mA and/or kV according to patient size and/or use of iterative reconstruction technique. COMPARISON:  07/29/2021.  Current chest radiograph. FINDINGS: CT CHEST FINDINGS Cardiovascular: Heart top-normal in size. Moderate pericardial effusion. Three-vessel coronary artery calcifications. Right ventricle and right atrial pacer leads are well positioned. Thoracic aorta normal in caliber. Mildly enlarged main pulmonary arteries, right 3 cm, left 3.2 cm. Mild aortic  atherosclerosis. Mediastinum/Nodes: No neck base, mediastinal or hilar masses. No enlarged lymph nodes. Trachea unremarkable. Mild distension of the esophagus. Small hiatal hernia. Lungs/Pleura: Moderate left and small to moderate right pleural effusions. Dependent lung opacities, most evident in the left lower lobe, consistent with atelectasis. No convincing pneumonia. No pulmonary edema. Airways widely patent. 2-3 mm nodule, right upper lobe, image 58/6. Several small right lung calcified granuloma. Mild to moderate centrilobular and paraseptal emphysema. No pneumothorax. Musculoskeletal: No acute fractures. Old posterior left rib fractures. No bone lesion. No chest wall mass. CT ABDOMEN PELVIS FINDINGS Hepatobiliary: No focal liver abnormality is seen. Status post cholecystectomy. No biliary dilatation. Pancreas: Atrophic pancreas with calcifications,  but no convincing mass or active inflammation. Duct dilation. Appearance similar to the prior CT. Spleen: Lobulated, relatively small spleen. No splenic mass. Stable appearance. Adrenals/Urinary Tract: No adrenal mass. Right renal cortical thinning. Normal left renal size. Multiple low-attenuation right renal masses consistent with cysts, stable with no follow-up recommended. No stones. No hydronephrosis. Ureters normal in course and in caliber. Bladder minimally distended, otherwise unremarkable. Stomach/Bowel: Right anterior abdominal wall hernias, 2 adjacent hernias in the right mid to upper abdomen for lateral with a base of 2 cm, more medial with a base of approximately 4 cm. Third hernia right periumbilical, base proximally 2.6 cm. All 3 hernias contain bowel. The smaller right upper abdominal wall hernia contains a knuckle of the right transverse colon. The other 2 hernias contain portions of small bowel. Small bowel distal to the right paraumbilical hernia is decompressed. Small bowel within the 2 hernias and proximal to the hernia in the right mid to lower  abdomen is dilated maximum approximately 3.8 cm. No small bowel wall thickening or adjacent inflammation. Small bowel anastomosis staple line noted right pelvis, stable from the prior CT. Vascular/Lymphatic: Aortic atherosclerosis. Low-attenuation mass versus a retrocrural lymph node lies to the right of the upper aorta just below the aortic hiatus, 2.5 x 2.1 cm transversely, unchanged from the prior CT. This may reflect a lymphocele. No definite enlarged lymph nodes. Reproductive: Small prostate, stable. Other: Diffuse subcutaneous edema.  No ascites. Musculoskeletal: No fracture or acute finding.  No bone lesion. IMPRESSION: 1. Moderate pericardial effusion. Moderate left and small to moderate right pleural effusions. Associated dependent atelectasis most evident in the left lower lobe. 2. No convincing pneumonia and no evidence of pulmonary edema. 3. 3 mm right upper lobe pulmonary nodule. Given the patient's age, in the presence multiple small calcified granuloma, no follow-up recommended. 4. Emphysema. Three anterior abdominal wall hernias, which were present on the prior CT. To hernias contain small bowel with evidence of partial obstruction at the level of the right periumbilical hernia. Small bowel obstructive changes are less pronounced than on the prior CT. No evidence of bowel wall edema or inflammation. 5. No other acute abnormality within the abdomen or pelvis. 6. Nonspecific diffuse subcutaneous soft tissue edema. 7. Other chronic findings as detailed stable from the prior CT. Electronically Signed   By: Lajean Manes M.D.   On: 07/06/2022 14:01   DG Chest Port 1 View  Result Date: 07/06/2022 CLINICAL DATA:  Short of breath. EXAM: PORTABLE CHEST 1 VIEW COMPARISON:  03/09/2022. FINDINGS: Moderate left and small right pleural effusions obscure the hemidiaphragms, part of the right and most of the left cardiac borders. Additional opacity at the lung bases is likely atelectasis. Mid to upper right and  upper left lungs are clear. No pneumothorax. Cardiac silhouette is mostly obscured. 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 No convincing mediastinal or hilar masses. Skeletal structures are grossly intact. IMPRESSION: 1. Moderate left and small right pleural effusions associated with lung base opacity, the latter finding consistent with atelectasis. Consider pneumonia if there are consistent clinical findings. No evidence of pulmonary edema. 2. Pleural effusions have significantly increased when compared to the prior chest radiograph. Electronically Signed   By: Lajean Manes M.D.   On: 07/06/2022 13:04    EKG: Independently reviewed.  Atrial fibrillation with rate 62; nonspecific ST changes with no evidence of acute ischemia   Labs on Admission: I have personally reviewed the available labs and imaging studies at the time of  the admission.  Pertinent labs:   hgb: 9.5 (baseline: 8-9),  BUN: 27,  creatinine: 1.79,  covid/flu/rsv: negative,  bnp: 365 Troponin: 16  Assessment and Plan: Principal Problem:   Acute on chronic diastolic CHF (congestive heart failure) ([name]) Active Problems:   Pleural effusion   Ventral hernia   BRADYCARDIA-TACHYCARDIA SYNDROME s/p PPM   Atrial fibrillation ([name])   Type 2 diabetes mellitus without complication, without long-term current use of insulin ([name])   Stage 3b chronic kidney disease ([name])   Essential hypertension   History of CVA (cerebrovascular accident)   Iron deficiency anemia   Benign prostatic hyperplasia with urinary obstruction   Alzheimer disease ([name])    Assessment and Plan: * Acute on chronic diastolic CHF (congestive heart failure) ([place]) 89 year old male presenting with one week history of dyspnea on exertion, fatigue, LE edema, cough found to have large left pleural effusion and elevated BNP consistent with acute on chronic diastolic CHF.  -obs to progressive -strict I/O -daily weights -echo 12/2021: EF of 55-60% normal  LVF. Diastolic indeterminate. Mild reduction in RVF. Small pericardial effusion present.  -echo asap with possible moderate pericardial effusion. Cardiology consulted, vitals stable  -given 51m of lasix, will monitor I/O and wait for to evaluate pericardial effusion before further diuresis  -no evidence of pulmonary edema  -may benefit from thoracentesis  -f/u on PPM interrogation  -continue beta blocker/jardiance   Pleural effusion Moderate left pleural effusion and small to moderate right pleural effusion Echo pending for pericardial effusion  May benefit from thoracentesis to help with symptoms pending echo  Requiring no oxygen and vitals stable Repeat CXR tomorrow   Ventral hernia CT reads partial obstruction, less pronounced than on prior CT from 07/2021 when he had SBO Hernias reducible. Stomach soft, no rebound or guarding He has vague stomach pain, no BM since Sunday Granddaughter reports passing gas  Discussed with general surgery and recommended to continue to let him eat and monitor closely, consult if change  BRADYCARDIA-TACHYCARDIA SYNDROME s/p PPM PPM interrogation ordered  Telemetry    Atrial fibrillation ([place] Rate controlled.  Continue toprol-xl and xarelto   Type 2 diabetes mellitus without complication, without long-term current use of insulin ([name]) A1C in 11/2021: 7.0 Update A1c Hold actos, continue jardiance SSI and accuchecks qac/hs   Stage 3b chronic kidney disease ([facility] Baseline creatinine 1.8-2.2, stable Strict I/O Monitor with diuresis   Essential hypertension Well controlled Continue toprol-xl 276mdaily   History of CVA (cerebrovascular accident) On no statin/ASA Continue xarelto   Iron deficiency anemia Stable at baseline Continue to monitor  Continue oral iron every other day   Benign prostatic hyperplasia with urinary obstruction Continue proscar and flomax daily   Alzheimer disease (HCAveryContinue seroquel at night Delirium  precautions     Advance Care Planning:   Code Status: Full Code   Consults: cardiology: Dr. CrStanford Breed DVT Prophylaxis: xarelto   Family Communication: updated granddaughter by phone: KeEdrick Kins Severity of Illness: The appropriate patient status for this patient is OBSERVATION. Observation status is judged to be reasonable and necessary in order to provide the required intensity of service to ensure the patient's safety. The patient's presenting symptoms, physical exam findings, and initial radiographic and laboratory data in the context of their medical condition is felt to place them at decreased risk for further clinical deterioration. Furthermore, it is anticipated that the patient will be medically stable for discharge from the hospital within 2 midnights of admission.  Author: Orma Flaming, MD 07/06/2022 5:57 PM  For on call review [email or web address].

## 2022-07-06 NOTE — ED Provider Notes (Signed)
[place] EMERGENCY DEPARTMENT AT [facility] Provider Note   CSN: [id number] Arrival date & time: 07/06/22  1128     History  Chief Complaint  Patient presents with   Shortness of Breath    [name] is a 89 y.o. male.   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 tach, chronic diastolic heart failure, dementia who presents to the emergency department with a chief complaint of shortness of breath.  The history is provided by family members bedside given the patient's history of dementia.  They state that he has been otherwise well but then developed sudden onset shortness of breath this morning.  They have noticed some lower extremity swelling, left greater than right.  He endorses dyspnea on exertion.  A mildly productive cough was noted.  No fevers or chills.  They feel that he has had decreased oral intake.  With his history of small bowel obstruction, they note that he has not had a bowel movement in 4 days.  He has been complaining of generalized abdominal discomfort.  No vomiting.  No sick contacts known.  He denies any chest pain.  He is at his baseline mental status which is alert and oriented x 3 with mild dementia.  Home Medications Prior to Admission medications   Medication Sig Start Date End Date Taking? Authorizing Provider  divalproex (DEPAKOTE SPRINKLE) 125 MG capsule Take 1 capsule (125 mg total) by mouth 2 (two) times daily. 05/27/22 07/06/22 Yes [name], MD  docusate sodium (COLACE) 100 MG capsule Take 1 capsule (100 mg total) by mouth every other day. 01/01/22  Yes Vann, Jessica U, DO  finasteride (PROSCAR) 5 MG tablet TAKE 1 TABLET (5 MG TOTAL) BY MOUTH DAILY. 06/07/22  Yes [name], MD  furosemide (LASIX) 20  MG tablet TAKE 1 TABLET BY MOUTH EVERY DAY 06/10/22  Yes Evans Lance, MD  Iron, Ferrous Sulfate, 325 (65 Fe) MG TABS Take 1 tablet every other day. Patient taking differently: Take 1 tablet by mouth every other day. 06/13/22  Yes [name], MD  JARDIANCE 10 MG TABS tablet Take 10 mg by mouth every morning. 02/11/22  Yes [provider]  latanoprost (XALATAN) 0.005 % ophthalmic solution Place 1 drop into both eyes at bedtime. 05/03/20  Yes [provider]  loratadine (CLARITIN) 10 MG tablet Take 10 mg by mouth every other day.   Yes [provider]  Melatonin 3 MG TBDP Take 1 tablet by mouth at bedtime.   Yes [provider]  metoprolol succinate (TOPROL-XL) 25 MG 24 hr tablet Take 1 tablet (25 mg total) by mouth daily. 01/02/22  Yes Vann, Jessica U, DO  pantoprazole (PROTONIX) 40 MG tablet TAKE 1 TABLET(40 MG) BY MOUTH DAILY Patient taking differently: Take 40 mg by mouth daily. 01/01/22  Yes [name], MD  pioglitazone (ACTOS) 15 MG tablet Take 1 tablet (15 mg total) by mouth daily. 02/14/22  Yes [name], MD  polyethylene glycol powder [facility]) 17 GM/SCOOP powder Take 17 g by mouth 2 (two) times daily as needed. Patient taking differently: Take 17 g by mouth 2 (two) times daily as needed for mild constipation. 08/03/21  Yes McElwee, Lauren A, NP  QUEtiapine (SEROQUEL) 25 MG tablet Take 25 mg by mouth at bedtime. 02/07/22  Yes [provider]  Rivaroxaban (XARELTO) 15 MG TABS tablet TAKE 1 TABLET DAILY WITH SUPPER Patient taking differently: Take 15 mg by mouth daily with supper. 05/22/22  Yes [name], MD  tamsulosin (FLOMAX) 0.4 MG CAPS capsule Take 1 capsule (0.4 mg total) by mouth daily. 12/04/21  Yes [name], MD      Allergies    Patient has no known allergies.    Review of Systems   Review of Systems  Unable to perform ROS: Dementia  Respiratory:  Positive for shortness  of breath.     Physical Exam Updated Vital Signs BP 114/68 (BP Location: Left Arm)   Pulse 65   Temp (!) 97.5 F (36.4 C) (Oral)   Resp 14   SpO2 100%  Physical Exam Vitals and nursing note reviewed.  Constitutional:      General: He is not in acute distress.    Comments: Frail appealing elderly male in no acute distress  HENT:     Head: Normocephalic and atraumatic.     Mouth/Throat:     Mouth: Mucous membranes are dry.  Eyes:     Conjunctiva/sclera: Conjunctivae normal.  Cardiovascular:     Rate and Rhythm: Normal rate and regular rhythm.     Pulses: Normal pulses.  Pulmonary:     Effort: Pulmonary effort is normal. No respiratory distress.     Breath sounds: Normal breath sounds.     Comments: No wheezing rales or rhonchi Abdominal:     Palpations: Abdomen is soft.     Tenderness: There is no abdominal tenderness.     Comments: Abdomen soft, nontender, nondistended, no rebound or guarding, ventral abdominal wall hernias soft, easily reducible  Musculoskeletal:        General: No swelling.     Cervical back: Neck supple.     Right lower leg: Edema present.     Left lower leg: Edema present.     Comments: Bilateral 1+ pitting edema in the lower extremities  Skin:    General: Skin is warm and dry.     Capillary Refill: Capillary refill takes less than 2 seconds.  Neurological:     General: No focal deficit present.     Mental Status: He is alert. Mental status is at baseline.     Cranial Nerves: No cranial nerve deficit.     Comments: Moving all 4 extremities without focal deficit  Psychiatric:        Mood and Affect: Mood normal.     ED Results / Procedures / Treatments   Labs (all labs ordered are listed, but only abnormal results are displayed) Labs Reviewed  COMPREHENSIVE METABOLIC PANEL - Abnormal; Notable for the following components:      Result Value   Glucose, Bld 194 (*)    BUN 27 (*)    Creatinine, Ser 1.79 (*)    Calcium 7.6 (*)    Total  Protein 6.2 (*)    Albumin 3.0 (*)    GFR, Estimated 34 (*)    All other components within normal limits  CBC WITH DIFFERENTIAL/PLATELET - Abnormal; Notable for the following components:   RBC 3.80 (*)    Hemoglobin 9.5 (*)    HCT 29.7 (*)    MCV 78.2 (*)    MCH 25.0 (*)    RDW 17.4 (*)    All other components within normal limits  BRAIN NATRIURETIC PEPTIDE - Abnormal; Notable for the following components:   B Natriuretic Peptide  365.0 (*)    All other components within normal limits  RESP PANEL BY RT-PCR (RSV, FLU A&B, COVID)  RVPGX2  LACTIC ACID, PLASMA  LACTIC ACID, PLASMA  HEMOGLOBIN A1C  MAGNESIUM  TSH  PROTIME-INR  BASIC METABOLIC PANEL  CBC  TROPONIN I (HIGH SENSITIVITY)  TROPONIN I (HIGH SENSITIVITY)    EKG EKG Interpretation  Date/Time:  Saturday July 06 2022 12:03:31 EST Ventricular Rate:  62 PR Interval:    QRS Duration: 175 QT Interval:  522 QTC Calculation: 522 R Axis:   230 Text Interpretation: Atrial fibrillation Nonspecific intraventricular conduction delay Minimal ST depression, inferior leads Confirmed by Regan Lemming ([id number]) on 07/06/2022 3:08:17 PM  Radiology CT CHEST ABDOMEN PELVIS WO CONTRAST  Result Date: 07/06/2022 CLINICAL DATA:  Abdominal pain. No bowel movement for 4 days. Also with shortness of breath and pleural effusions. History of small-bowel obstruction. EXAM: CT CHEST, ABDOMEN AND PELVIS WITHOUT CONTRAST TECHNIQUE: Multidetector CT imaging of the chest, abdomen and pelvis was performed following the standard protocol without IV contrast. RADIATION DOSE REDUCTION: This exam was performed according to the departmental dose-optimization program which includes automated exposure control, adjustment of the mA and/or kV according to patient size and/or use of iterative reconstruction technique. COMPARISON:  07/29/2021.  Current chest radiograph. FINDINGS: CT CHEST FINDINGS Cardiovascular: Heart top-normal in size. Moderate pericardial  effusion. Three-vessel coronary artery calcifications. Right ventricle and right atrial pacer leads are well positioned. Thoracic aorta normal in caliber. Mildly enlarged main pulmonary arteries, right 3 cm, left 3.2 cm. Mild aortic atherosclerosis. Mediastinum/Nodes: No neck base, mediastinal or hilar masses. No enlarged lymph nodes. Trachea unremarkable. Mild distension of the esophagus. Small hiatal hernia. Lungs/Pleura: Moderate left and small to moderate right pleural effusions. Dependent lung opacities, most evident in the left lower lobe, consistent with atelectasis. No convincing pneumonia. No pulmonary edema. Airways widely patent. 2-3 mm nodule, right upper lobe, image 58/6. Several small right lung calcified granuloma. Mild to moderate centrilobular and paraseptal emphysema. No pneumothorax. Musculoskeletal: No acute fractures. Old posterior left rib fractures. No bone lesion. No chest wall mass. CT ABDOMEN PELVIS FINDINGS Hepatobiliary: No focal liver abnormality is seen. Status post cholecystectomy. No biliary dilatation. Pancreas: Atrophic pancreas with calcifications, but no convincing mass or active inflammation. Duct dilation. Appearance similar to the prior CT. Spleen: Lobulated, relatively small spleen. No splenic mass. Stable appearance. Adrenals/Urinary Tract: No adrenal mass. Right renal cortical thinning. Normal left renal size. Multiple low-attenuation right renal masses consistent with cysts, stable with no follow-up recommended. No stones. No hydronephrosis. Ureters normal in course and in caliber. Bladder minimally distended, otherwise unremarkable. Stomach/Bowel: Right anterior abdominal wall hernias, 2 adjacent hernias in the right mid to upper abdomen for lateral with a base of 2 cm, more medial with a base of approximately 4 cm. Third hernia right periumbilical, base proximally 2.6 cm. All 3 hernias contain bowel. The smaller right upper abdominal wall hernia contains a knuckle of the  right transverse colon. The other 2 hernias contain portions of small bowel. Small bowel distal to the right paraumbilical hernia is decompressed. Small bowel within the 2 hernias and proximal to the hernia in the right mid to lower abdomen is dilated maximum approximately 3.8 cm. No small bowel wall thickening or adjacent inflammation. Small bowel anastomosis staple line noted right pelvis, stable from the prior CT. Vascular/Lymphatic: Aortic atherosclerosis. Low-attenuation mass versus a retrocrural lymph node lies to the right of the upper aorta just below the aortic hiatus, 2.5 x 2.1 cm  transversely, unchanged from the prior CT. This may reflect a lymphocele. No definite enlarged lymph nodes. Reproductive: Small prostate, stable. Other: Diffuse subcutaneous edema.  No ascites. Musculoskeletal: No fracture or acute finding.  No bone lesion. IMPRESSION: 1. Moderate pericardial effusion. Moderate left and small to moderate right pleural effusions. Associated dependent atelectasis most evident in the left lower lobe. 2. No convincing pneumonia and no evidence of pulmonary edema. 3. 3 mm right upper lobe pulmonary nodule. Given the patient's age, in the presence multiple small calcified granuloma, no follow-up recommended. 4. Emphysema. Three anterior abdominal wall hernias, which were present on the prior CT. To hernias contain small bowel with evidence of partial obstruction at the level of the right periumbilical hernia. Small bowel obstructive changes are less pronounced than on the prior CT. No evidence of bowel wall edema or inflammation. 5. No other acute abnormality within the abdomen or pelvis. 6. Nonspecific diffuse subcutaneous soft tissue edema. 7. Other chronic findings as detailed stable from the prior CT. Electronically Signed   By: Lajean Manes M.D.   On: 07/06/2022 14:01   DG Chest Port 1 View  Result Date: 07/06/2022 CLINICAL DATA:  Short of breath. EXAM: PORTABLE CHEST 1 VIEW COMPARISON:   03/09/2022. FINDINGS: Moderate left and small right pleural effusions obscure the hemidiaphragms, part of the right and most of the left cardiac borders. Additional opacity at the lung bases is likely atelectasis. Mid to upper right and upper left lungs are clear. No pneumothorax. Cardiac silhouette is mostly obscured. 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 No convincing mediastinal or hilar masses. Skeletal structures are grossly intact. IMPRESSION: 1. Moderate left and small right pleural effusions associated with lung base opacity, the latter finding consistent with atelectasis. Consider pneumonia if there are consistent clinical findings. No evidence of pulmonary edema. 2. Pleural effusions have significantly increased when compared to the prior chest radiograph. Electronically Signed   By: Lajean Manes M.D.   On: 07/06/2022 13:04    Procedures Procedures    Medications Ordered in ED Medications  insulin aspart (novoLOG) injection 0-9 Units (has no administration in time range)  sodium chloride flush (NS) 0.9 % injection 3 mL (has no administration in time range)  sodium chloride flush (NS) 0.9 % injection 3 mL (has no administration in time range)  0.9 %  sodium chloride infusion (has no administration in time range)  acetaminophen (TYLENOL) tablet 650 mg (has no administration in time range)    Or  acetaminophen (TYLENOL) suppository 650 mg (has no administration in time range)  furosemide (LASIX) injection 20 mg (20 mg Intravenous Given 07/06/22 1526)    ED Course/ Medical Decision Making/ A&P                             Medical Decision Making Amount and/or Complexity of Data Reviewed Labs: ordered. Radiology: ordered.  Risk Prescription drug management. Decision regarding hospitalization.     89 year old male with extensive past medical history to include DM2, chronic atrial fibrillation on Xarelto, bradycardia/tachycardia syndrome with a permanent Medtronic  pacemaker, small bowel obstruction, prior CVA, CKD, malnutrition, V. tach, chronic diastolic heart failure, dementia who presents to the emergency department with a chief complaint of shortness of breath.  The history is provided by family members bedside given the patient's history of dementia.  They state that he has been otherwise well but then developed sudden onset shortness of breath this morning.  They  have noticed some lower extremity swelling, left greater than right.  He endorses dyspnea on exertion.  A mildly productive cough was noted.  No fevers or chills.  They feel that he has had decreased oral intake.  With his history of small bowel obstruction, they note that he has not had a bowel movement in 4 days.  He has been complaining of generalized abdominal discomfort.  No vomiting.  No sick contacts known.  He denies any chest pain.  He is at his baseline mental status which is alert and oriented x 3 with mild dementia.  On arrival, the patient was afebrile, not tachycardic or tachypneic, blood pressure 114/68, saturating well on room air.  Physical exam significant for patient with lungs clear to auscultation bilaterally, mild trace to 1+ bilateral pitting edema, abdomen soft and reassuring with no evidence of incarcerated hernia on exam.  Differential diagnosis includes pneumonia, pneumothorax, PE, COVID-19, viral infection, pleural effusions, pericardial effusion, CHF, partial to full small bowel obstruction, constipation.  Laboratory evaluation significant for cardiac troponin 15, lactic acid 1.3, COVID-19, influenza, RSV PCR testing negative, CMP with evidence of an elevated serum creatinine to 1.79, trending from 1 month ago, CBC without a leukocytosis, stable anemia to 9.5, BNP mildly elevated at 365.  Patient was administered IV furosemide 20 mg given his lower extremity edema and dyspnea and mildly elevated BNP.  An EKG was performed which revealed atrial fibrillation, ventricular rate  62, nonspecific ST changes present.  Chest x-ray revealed bilateral pleural effusions.  A CT chest abdomen pelvis given the patient's poor renal function was performed and results are as follows: IMPRESSION:  1. Moderate pericardial effusion. Moderate left and small to  moderate right pleural effusions. Associated dependent atelectasis  most evident in the left lower lobe.  2. No convincing pneumonia and no evidence of pulmonary edema.  3. 3 mm right upper lobe pulmonary nodule. Given the patient's age,  in the presence multiple small calcified granuloma, no follow-up  recommended.  4. Emphysema. Three anterior abdominal wall hernias, which were  present on the prior CT. To hernias contain small bowel with  evidence of partial obstruction at the level of the right  periumbilical hernia. Small bowel obstructive changes are less  pronounced than on the prior CT. No evidence of bowel wall edema or  inflammation.  5. No other acute abnormality within the abdomen or pelvis.  6. Nonspecific diffuse subcutaneous soft tissue edema.  7. Other chronic findings as detailed stable from the prior CT.    I spoke with Dr. Stanford Breed of on-call cardiology regarding the patient's pericardial effusion.  He currently has no tamponade physiology.  Dr. Stanford Breed recommended echocardiogram inpatient and observation, reconsultation as needed.  The patient is passing gas, he is not vomiting.  He has a partial obstruction on CT with no evidence for incarcerated hernia.  Per CT read, "small bowel obstructive changes are less pronounced than on prior CT." Of note, the patient is passing gas.   Interrogation of the patient's PPM was ordered, medtronic results faxed and reviewed by myself, no episodes of nonsustained or sustained VT during the past few days that could explain the patients dyspnea.    Given the patient's dyspnea, pleural effusions and pericardial effusion, partial obstruction seen on CT, hospitalist  medicine was consulted for admission. Plan for conservative management and initiation of a bowel regimen, patient may benefit from thoracentesis inpatient however at this time no emergent need for pericardiocentesis or thoracentesis, patient normotensive, not tachycardic,  not tachypneic, saturating well on room air.  Dr. Eliberto Ivory of hospitalist medicine was consulted and accepted patient admission for further monitoring and management.  Final Clinical Impression(s) / ED Diagnoses Final diagnoses:  Partial small bowel obstruction ([name])  Pleural effusion  Pericardial effusion  Dyspnea, unspecified type    Rx / DC Orders ED Discharge Orders     None         Regan Lemming, MD 07/06/22 1820

## 2022-07-06 NOTE — Assessment & Plan Note (Signed)
On no statin/ASA Continue xarelto

## 2022-07-06 NOTE — Assessment & Plan Note (Signed)
CT reads partial obstruction, less pronounced than on prior CT from 07/2021 when he had SBO Hernias reducible. Stomach soft, no rebound or guarding He has vague stomach pain, no BM since Sunday Granddaughter reports passing gas  Discussed with general surgery and recommended to continue to let him eat and monitor closely, consult if change

## 2022-07-06 NOTE — Assessment & Plan Note (Addendum)
Continue proscar and flomax daily

## 2022-07-06 NOTE — Assessment & Plan Note (Signed)
Continue seroquel at night Delirium precautions

## 2022-07-07 ENCOUNTER — Observation Stay (HOSPITAL_COMMUNITY): Payer: No Typology Code available for payment source

## 2022-07-07 DIAGNOSIS — J918 Pleural effusion in other conditions classified elsewhere: Secondary | ICD-10-CM | POA: Diagnosis present

## 2022-07-07 DIAGNOSIS — Z950 Presence of cardiac pacemaker: Secondary | ICD-10-CM | POA: Diagnosis not present

## 2022-07-07 DIAGNOSIS — I495 Sick sinus syndrome: Secondary | ICD-10-CM | POA: Diagnosis not present

## 2022-07-07 DIAGNOSIS — I482 Chronic atrial fibrillation, unspecified: Secondary | ICD-10-CM | POA: Diagnosis present

## 2022-07-07 DIAGNOSIS — I5033 Acute on chronic diastolic (congestive) heart failure: Secondary | ICD-10-CM | POA: Diagnosis present

## 2022-07-07 DIAGNOSIS — N138 Other obstructive and reflux uropathy: Secondary | ICD-10-CM | POA: Diagnosis present

## 2022-07-07 DIAGNOSIS — I10 Essential (primary) hypertension: Secondary | ICD-10-CM | POA: Diagnosis not present

## 2022-07-07 DIAGNOSIS — Z87891 Personal history of nicotine dependence: Secondary | ICD-10-CM | POA: Diagnosis not present

## 2022-07-07 DIAGNOSIS — N401 Enlarged prostate with lower urinary tract symptoms: Secondary | ICD-10-CM | POA: Diagnosis present

## 2022-07-07 DIAGNOSIS — I3139 Other pericardial effusion (noninflammatory): Secondary | ICD-10-CM | POA: Diagnosis present

## 2022-07-07 DIAGNOSIS — Z66 Do not resuscitate: Secondary | ICD-10-CM | POA: Diagnosis not present

## 2022-07-07 DIAGNOSIS — F02A0 Dementia in other diseases classified elsewhere, mild, without behavioral disturbance, psychotic disturbance, mood disturbance, and anxiety: Secondary | ICD-10-CM | POA: Diagnosis present

## 2022-07-07 DIAGNOSIS — G309 Alzheimer's disease, unspecified: Secondary | ICD-10-CM | POA: Diagnosis present

## 2022-07-07 DIAGNOSIS — Z8546 Personal history of malignant neoplasm of prostate: Secondary | ICD-10-CM | POA: Diagnosis not present

## 2022-07-07 DIAGNOSIS — L89152 Pressure ulcer of sacral region, stage 2: Secondary | ICD-10-CM | POA: Diagnosis present

## 2022-07-07 DIAGNOSIS — J9811 Atelectasis: Secondary | ICD-10-CM | POA: Diagnosis present

## 2022-07-07 DIAGNOSIS — J439 Emphysema, unspecified: Secondary | ICD-10-CM | POA: Diagnosis present

## 2022-07-07 DIAGNOSIS — Z8673 Personal history of transient ischemic attack (TIA), and cerebral infarction without residual deficits: Secondary | ICD-10-CM | POA: Diagnosis not present

## 2022-07-07 DIAGNOSIS — D509 Iron deficiency anemia, unspecified: Secondary | ICD-10-CM | POA: Diagnosis present

## 2022-07-07 DIAGNOSIS — Z8709 Personal history of other diseases of the respiratory system: Secondary | ICD-10-CM | POA: Diagnosis not present

## 2022-07-07 DIAGNOSIS — I700 Atherosclerosis of aorta: Secondary | ICD-10-CM | POA: Diagnosis not present

## 2022-07-07 DIAGNOSIS — E1122 Type 2 diabetes mellitus with diabetic chronic kidney disease: Secondary | ICD-10-CM | POA: Diagnosis present

## 2022-07-07 DIAGNOSIS — Z79899 Other long term (current) drug therapy: Secondary | ICD-10-CM | POA: Diagnosis not present

## 2022-07-07 DIAGNOSIS — I130 Hypertensive heart and chronic kidney disease with heart failure and stage 1 through stage 4 chronic kidney disease, or unspecified chronic kidney disease: Secondary | ICD-10-CM | POA: Diagnosis present

## 2022-07-07 DIAGNOSIS — K5900 Constipation, unspecified: Secondary | ICD-10-CM | POA: Diagnosis not present

## 2022-07-07 DIAGNOSIS — N1832 Chronic kidney disease, stage 3b: Secondary | ICD-10-CM | POA: Diagnosis present

## 2022-07-07 DIAGNOSIS — Z1152 Encounter for screening for COVID-19: Secondary | ICD-10-CM | POA: Diagnosis not present

## 2022-07-07 DIAGNOSIS — Z823 Family history of stroke: Secondary | ICD-10-CM | POA: Diagnosis not present

## 2022-07-07 DIAGNOSIS — J90 Pleural effusion, not elsewhere classified: Secondary | ICD-10-CM | POA: Diagnosis not present

## 2022-07-07 LAB — ECHOCARDIOGRAM COMPLETE
Area-P 1/2: 2.6 cm2
Height: 70 in
S' Lateral: 3.1 cm
Weight: 2555.2 oz

## 2022-07-07 LAB — CBC
HCT: 27.6 % — ABNORMAL LOW (ref 39.0–52.0)
Hemoglobin: 9 g/dL — ABNORMAL LOW (ref 13.0–17.0)
MCH: 24.9 pg — ABNORMAL LOW (ref 26.0–34.0)
MCHC: 32.6 g/dL (ref 30.0–36.0)
MCV: 76.2 fL — ABNORMAL LOW (ref 80.0–100.0)
Platelets: 138 10*3/uL — ABNORMAL LOW (ref 150–400)
RBC: 3.62 MIL/uL — ABNORMAL LOW (ref 4.22–5.81)
RDW: 17.1 % — ABNORMAL HIGH (ref 11.5–15.5)
WBC: 8.1 10*3/uL (ref 4.0–10.5)
nRBC: 0 % (ref 0.0–0.2)

## 2022-07-07 LAB — GLUCOSE, CAPILLARY
Glucose-Capillary: 151 mg/dL — ABNORMAL HIGH (ref 70–99)
Glucose-Capillary: 150 mg/dL — ABNORMAL HIGH (ref 70–99)
Glucose-Capillary: 191 mg/dL — ABNORMAL HIGH (ref 70–99)
Glucose-Capillary: 71 mg/dL (ref 70–99)

## 2022-07-07 LAB — BASIC METABOLIC PANEL
Anion gap: 9 (ref 5–15)
BUN: 27 mg/dL — ABNORMAL HIGH (ref 8–23)
CO2: 24 mmol/L (ref 22–32)
Calcium: 7.9 mg/dL — ABNORMAL LOW (ref 8.9–10.3)
Chloride: 104 mmol/L (ref 98–111)
Creatinine, Ser: 1.92 mg/dL — ABNORMAL HIGH (ref 0.61–1.24)
GFR, Estimated: 31 mL/min — ABNORMAL LOW (ref >60)
Glucose, Bld: 115 mg/dL — ABNORMAL HIGH (ref 70–99)
Potassium: 4.4 mmol/L (ref 3.5–5.1)
Sodium: 137 mmol/L (ref 135–145)

## 2022-07-07 LAB — LACTIC ACID, PLASMA: Lactic Acid, Venous: 1.4 mmol/L (ref 0.5–1.9)

## 2022-07-07 MED ORDER — BISACODYL 5 MG PO TBEC
5.0000 mg | DELAYED_RELEASE_TABLET | Freq: Two times a day (BID) | ORAL | Status: AC
  Administered 2022-07-07 – 2022-07-09 (×4): 5 mg via ORAL
  Filled 2022-07-07 (×4): qty 1

## 2022-07-07 MED ORDER — SENNOSIDES-DOCUSATE SODIUM 8.6-50 MG PO TABS: 1.0000 | ORAL_TABLET | Freq: Every evening | ORAL | Status: DC | PRN

## 2022-07-07 MED ORDER — INSULIN ASPART 100 UNIT/ML IJ SOLN
0.0000 [IU] | Freq: Three times a day (TID) | INTRAMUSCULAR | Status: DC
  Administered 2022-07-07: 1 [IU] via SUBCUTANEOUS
  Administered 2022-07-07: 2 [IU] via SUBCUTANEOUS
  Administered 2022-07-08: 1 [IU] via SUBCUTANEOUS
  Administered 2022-07-09: 2 [IU] via SUBCUTANEOUS
  Administered 2022-07-09: 1 [IU] via SUBCUTANEOUS

## 2022-07-07 MED ORDER — FUROSEMIDE 10 MG/ML IJ SOLN
20.0000 mg | Freq: Every day | INTRAMUSCULAR | Status: DC
  Administered 2022-07-07 – 2022-07-10 (×4): 20 mg via INTRAVENOUS
  Filled 2022-07-07 (×4): qty 2

## 2022-07-07 MED ORDER — GUAIFENESIN 100 MG/5ML PO LIQD: 5.0000 mL | ORAL | Status: DC | PRN

## 2022-07-07 MED ORDER — METOPROLOL TARTRATE 5 MG/5ML IV SOLN: 5.0000 mg | INTRAVENOUS | Status: DC | PRN

## 2022-07-07 MED ORDER — DOCUSATE SODIUM 100 MG PO CAPS
200.0000 mg | ORAL_CAPSULE | Freq: Every day | ORAL | Status: DC
  Administered 2022-07-07 – 2022-07-10 (×4): 200 mg via ORAL
  Filled 2022-07-07 (×4): qty 2

## 2022-07-07 MED ORDER — HYDRALAZINE HCL 20 MG/ML IJ SOLN: 10.0000 mg | INTRAMUSCULAR | Status: DC | PRN

## 2022-07-07 MED ORDER — TRAZODONE HCL 50 MG PO TABS
50.0000 mg | ORAL_TABLET | Freq: Every evening | ORAL | Status: DC | PRN
  Filled 2022-07-07: qty 1

## 2022-07-07 MED ORDER — PERFLUTREN LIPID MICROSPHERE
1.0000 mL | INTRAVENOUS | Status: AC | PRN
  Administered 2022-07-07: 3 mL via INTRAVENOUS

## 2022-07-07 MED ORDER — IPRATROPIUM-ALBUTEROL 0.5-2.5 (3) MG/3ML IN SOLN: 3.0000 mL | RESPIRATORY_TRACT | Status: DC | PRN

## 2022-07-07 NOTE — Progress Notes (Addendum)
Received msg from granddaughter that patient is no longer taking Jardiance and she requests that he not receive it. She is the patient's main caregiver. Will hold medication at this time.

## 2022-07-07 NOTE — Progress Notes (Signed)
Echocardiogram 2D Echocardiogram has been performed.  [name] 07/07/2022, 8:41 AM

## 2022-07-07 NOTE — Progress Notes (Signed)
PROGRESS NOTE    [name]  [id number] DOB: Nov 11, 1923 DOA: 07/06/2022 PCP: [name], MD   Brief Narrative:  89 year old with history of A-fib, CKD stage IIIb, prostate cancer, HTN, PPM, history of CVA, diastolic CHF, DM2, advanced Alzheimer's dementia presents to the hospital for ongoing shortness of breath and dyspnea on exertion, poor oral intake.  In ER COVID/flu/RSV were negative, BNP 365.  Chest x-ray showed concerns of moderate left-sided pleural effusion.  CT abdomen pelvis also showed moderate pericardial effusion and moderate left and right-sided pleural effusion.  Cardiology team was consulted, PPM was interrogated.  Assessment & Plan:  Principal Problem:   Acute on chronic diastolic CHF (congestive heart failure) ([name]) Active Problems:   Pleural effusion   Ventral hernia   BRADYCARDIA-TACHYCARDIA SYNDROME s/p PPM   Atrial fibrillation ([name])   Type 2 diabetes mellitus without complication, without long-term current use of insulin ([name])   Stage 3b chronic kidney disease ([name])   Essential hypertension   History of CVA (cerebrovascular accident)   Iron deficiency anemia   Benign prostatic hyperplasia with urinary obstruction   Alzheimer disease ([name])     Assessment and Plan: * Acute on chronic diastolic CHF (congestive heart failure) ([name]) -Echocardiogram in August 2023 showed EF of 60% with mild reduction in RV function.  At that time small pericardial effusion was present but on the CT scan it has progressed to moderate size.  EDP discussed case with Dr. Stanford Breed who recommended repeat echocardiogram and will consult if necessary. - Lasix 35m iv daily. Will adjust as needed.  - Currently on Toprol-XL, Jardiance  Bilateral pleural effusion and pericardial effusion -This was seen on the CT scan and chest x-ray.  Patient currently remains on room air.  Echocardiogram confirms pericardial effusion without any tamponade etiology. On IV lasix. -Will  monitor his breathing, if he starts having distress, will plan for thoracentesis.  Ventral hernia -CT scan showing possible obstruction but it is less pronounced than prior CT scan done about a year ago.  Admitting provider discussed case with general surgery who recommends conservative management as patient is not currently overtly symptomatic and okay with p.o. intake.  BRADYCARDIA-TACHYCARDIA SYNDROME s/p PPM PPM interrogation ordered  Telemetry   Atrial fibrillation ([facility] Rate controlled.  Continue toprol-xl and xarelto   Type 2 diabetes mellitus without complication, without long-term current use of insulin ([facility] Actos on hold.  A1c 9.2 Hold actos, continue jardiance SSI and accuchecks qac/hs   Stage 3b chronic kidney disease ([facility] Baseline creatinine 2.0, today 1.9  Essential hypertension Well controlled Continue toprol-xl 256mdaily   History of CVA (cerebrovascular accident) Continue Xarelto.  Not on statin  Iron deficiency anemia with microcytosis Continue home oral supplements.  Hemoglobin around baseline of 9.0  Benign prostatic hyperplasia with urinary obstruction Continue proscar and flomax daily   Alzheimer disease ([facility] Seroquel  Sacral stage II ulcer-present prior to admission    DVT prophylaxis: Xarelto Code Status: Full code Family Communication:  Called Granddaughter, KeIzell Carolinao answer.  Cont hospital      Subjective:  No complaints. Sitting up in the chair this morning.  Spoke with son in law, he has had facial droop >6 months.  Uses 1 cane to get around.   Examination:  General exam: Appears calm and comfortable  Respiratory system: bibasilar crackles.  Cardiovascular system: S1 & S2 heard, RRR. No JVD, murmurs, rubs, gallops or clicks. No pedal edema. Gastrointestinal system: Abdomen is nondistended, soft and nontender.  No organomegaly or masses felt. Normal bowel sounds heard. Central nervous system: Alert and oriented. No  focal neurological deficits. Extremities: Symmetric 4 x 5 power. Skin: No rashes, lesions or ulcers Psychiatry: Judgement and insight appear Poor  External Foley catheter  Objective: Vitals:   07/06/22 2250 07/07/22 0023 07/07/22 0450 07/07/22 0500  BP: 128/77 122/69 100/70   Pulse: 64 80 83   Resp:  (!) 22 19   Temp:  97.9 F (36.6 C) 98.1 F (36.7 C)   TempSrc:  Oral Oral   SpO2:   98%   Weight:    72.4 kg  Height:       No intake or output data in the 24 hours ending 07/07/22 0811 Filed Weights   07/06/22 2035 07/07/22 0500  Weight: 73.6 kg 72.4 kg     Data Reviewed:   CBC: Recent Labs  Lab 07/06/22 1211 07/07/22 0349  WBC 6.9 8.1  NEUTROABS 5.2  --   HGB 9.5* 9.0*  HCT 29.7* 27.6*  MCV 78.2* 76.2*  PLT 152 0000000*   Basic Metabolic Panel: Recent Labs  Lab 07/06/22 1211 07/06/22 1754 07/07/22 0349  NA 136  --  137  K 4.5  --  4.4  CL 105  --  104  CO2 25  --  24  GLUCOSE 194*  --  115*  BUN 27*  --  27*  CREATININE 1.79*  --  1.92*  CALCIUM 7.6*  --  7.9*  MG  --  2.3  --    GFR: Estimated Creatinine Clearance: 23 mL/min (A) (by C-G formula based on SCr of 1.92 mg/dL (H)). Liver Function Tests: Recent Labs  Lab 07/06/22 1211  AST 18  ALT 8  ALKPHOS 55  BILITOT 0.4  PROT 6.2*  ALBUMIN 3.0*   No results for input(s): "LIPASE", "AMYLASE" in the last 168 hours. No results for input(s): "AMMONIA" in the last 168 hours. Coagulation Profile: Recent Labs  Lab 07/06/22 1754  INR 1.6*   Cardiac Enzymes: No results for input(s): "CKTOTAL", "CKMB", "CKMBINDEX", "TROPONINI" in the last 168 hours. BNP (last 3 results) No results for input(s): "PROBNP" in the last 8760 hours. HbA1C: Recent Labs    07/06/22 1627  HGBA1C 9.2*   CBG: Recent Labs  Lab 07/06/22 1830 07/06/22 2054 07/07/22 0751  GLUCAP 139* 94 151*   Lipid Profile: No results for input(s): "CHOL", "HDL", "LDLCALC", "TRIG", "CHOLHDL", "LDLDIRECT" in the last 72  hours. Thyroid Function Tests: Recent Labs    07/06/22 1630  TSH 4.549*   Anemia Panel: No results for input(s): "VITAMINB12", "FOLATE", "FERRITIN", "TIBC", "IRON", "RETICCTPCT" in the last 72 hours. Sepsis Labs: Recent Labs  Lab 07/06/22 1627 07/06/22 2115 07/07/22 0349  LATICACIDVEN 1.3 3.3* 1.4    Recent Results (from the past 240 hour(s))  Resp panel by RT-PCR (RSV, Flu A&B, Covid) Anterior Nasal Swab     Status: None   Collection Time: 07/06/22 12:12 PM   Specimen: Anterior Nasal Swab  Result Value Ref Range Status   SARS Coronavirus 2 by RT PCR NEGATIVE NEGATIVE Final    Comment: (NOTE) SARS-CoV-2 target nucleic acids are NOT DETECTED.  The SARS-CoV-2 RNA is generally detectable in upper respiratory specimens during the acute phase of infection. The lowest concentration of SARS-CoV-2 viral copies this assay can detect is 138 copies/mL. A negative result does not preclude SARS-Cov-2 infection and should not be used as the sole basis for treatment or other patient management decisions. A negative result may  occur with  improper specimen collection/handling, submission of specimen other than nasopharyngeal swab, presence of viral mutation(s) within the areas targeted by this assay, and inadequate number of viral copies(<138 copies/mL). A negative result must be combined with clinical observations, patient history, and epidemiological information. The expected result is Negative.  Fact Sheet for Patients:  EntrepreneurPulse.com.au  Fact Sheet for Healthcare Providers:  IncredibleEmployment.be  This test is no t yet approved or cleared by the [place] FDA and  has been authorized for detection and/or diagnosis of SARS-CoV-2 by FDA under an Emergency Use Authorization (EUA). This EUA will remain  in effect (meaning this test can be used) for the duration of the COVID-19 declaration under Section 564(b)(1) of the Act,  21 U.S.C.section 360bbb-3(b)(1), unless the authorization is terminated  or revoked sooner.       Influenza A by PCR NEGATIVE NEGATIVE Final   Influenza B by PCR NEGATIVE NEGATIVE Final    Comment: (NOTE) The Xpert Xpress SARS-CoV-2/FLU/RSV plus assay is intended as an aid in the diagnosis of influenza from Nasopharyngeal swab specimens and should not be used as a sole basis for treatment. Nasal washings and aspirates are unacceptable for Xpert Xpress SARS-CoV-2/FLU/RSV testing.  Fact Sheet for Patients: EntrepreneurPulse.com.au  Fact Sheet for Healthcare Providers: IncredibleEmployment.be  This test is not yet approved or cleared by the [place] FDA and has been authorized for detection and/or diagnosis of SARS-CoV-2 by FDA under an Emergency Use Authorization (EUA). This EUA will remain in effect (meaning this test can be used) for the duration of the COVID-19 declaration under Section 564(b)(1) of the Act, 21 U.S.C. section 360bbb-3(b)(1), unless the authorization is terminated or revoked.     Resp Syncytial Virus by PCR NEGATIVE NEGATIVE Final    Comment: (NOTE) Fact Sheet for Patients: EntrepreneurPulse.com.au  Fact Sheet for Healthcare Providers: IncredibleEmployment.be  This test is not yet approved or cleared by the [place] FDA and has been authorized for detection and/or diagnosis of SARS-CoV-2 by FDA under an Emergency Use Authorization (EUA). This EUA will remain in effect (meaning this test can be used) for the duration of the COVID-19 declaration under Section 564(b)(1) of the Act, 21 U.S.C. section 360bbb-3(b)(1), unless the authorization is terminated or revoked.  Performed at [facility], [place] [address], [place], [place] [address]          Radiology Studies: CT CHEST ABDOMEN PELVIS WO CONTRAST  Result Date: 07/06/2022 CLINICAL DATA:  Abdominal  pain. No bowel movement for 4 days. Also with shortness of breath and pleural effusions. History of small-bowel obstruction. EXAM: CT CHEST, ABDOMEN AND PELVIS WITHOUT CONTRAST TECHNIQUE: Multidetector CT imaging of the chest, abdomen and pelvis was performed following the standard protocol without IV contrast. RADIATION DOSE REDUCTION: This exam was performed according to the departmental dose-optimization program which includes automated exposure control, adjustment of the mA and/or kV according to patient size and/or use of iterative reconstruction technique. COMPARISON:  07/29/2021.  Current chest radiograph. FINDINGS: CT CHEST FINDINGS Cardiovascular: Heart top-normal in size. Moderate pericardial effusion. Three-vessel coronary artery calcifications. Right ventricle and right atrial pacer leads are well positioned. Thoracic aorta normal in caliber. Mildly enlarged main pulmonary arteries, right 3 cm, left 3.2 cm. Mild aortic atherosclerosis. Mediastinum/Nodes: No neck base, mediastinal or hilar masses. No enlarged lymph nodes. Trachea unremarkable. Mild distension of the esophagus. Small hiatal hernia. Lungs/Pleura: Moderate left and small to moderate right pleural effusions. Dependent lung opacities, most evident in the left lower lobe, consistent  with atelectasis. No convincing pneumonia. No pulmonary edema. Airways widely patent. 2-3 mm nodule, right upper lobe, image 58/6. Several small right lung calcified granuloma. Mild to moderate centrilobular and paraseptal emphysema. No pneumothorax. Musculoskeletal: No acute fractures. Old posterior left rib fractures. No bone lesion. No chest wall mass. CT ABDOMEN PELVIS FINDINGS Hepatobiliary: No focal liver abnormality is seen. Status post cholecystectomy. No biliary dilatation. Pancreas: Atrophic pancreas with calcifications, but no convincing mass or active inflammation. Duct dilation. Appearance similar to the prior CT. Spleen: Lobulated, relatively small  spleen. No splenic mass. Stable appearance. Adrenals/Urinary Tract: No adrenal mass. Right renal cortical thinning. Normal left renal size. Multiple low-attenuation right renal masses consistent with cysts, stable with no follow-up recommended. No stones. No hydronephrosis. Ureters normal in course and in caliber. Bladder minimally distended, otherwise unremarkable. Stomach/Bowel: Right anterior abdominal wall hernias, 2 adjacent hernias in the right mid to upper abdomen for lateral with a base of 2 cm, more medial with a base of approximately 4 cm. Third hernia right periumbilical, base proximally 2.6 cm. All 3 hernias contain bowel. The smaller right upper abdominal wall hernia contains a knuckle of the right transverse colon. The other 2 hernias contain portions of small bowel. Small bowel distal to the right paraumbilical hernia is decompressed. Small bowel within the 2 hernias and proximal to the hernia in the right mid to lower abdomen is dilated maximum approximately 3.8 cm. No small bowel wall thickening or adjacent inflammation. Small bowel anastomosis staple line noted right pelvis, stable from the prior CT. Vascular/Lymphatic: Aortic atherosclerosis. Low-attenuation mass versus a retrocrural lymph node lies to the right of the upper aorta just below the aortic hiatus, 2.5 x 2.1 cm transversely, unchanged from the prior CT. This may reflect a lymphocele. No definite enlarged lymph nodes. Reproductive: Small prostate, stable. Other: Diffuse subcutaneous edema.  No ascites. Musculoskeletal: No fracture or acute finding.  No bone lesion. IMPRESSION: 1. Moderate pericardial effusion. Moderate left and small to moderate right pleural effusions. Associated dependent atelectasis most evident in the left lower lobe. 2. No convincing pneumonia and no evidence of pulmonary edema. 3. 3 mm right upper lobe pulmonary nodule. Given the patient's age, in the presence multiple small calcified granuloma, no follow-up  recommended. 4. Emphysema. Three anterior abdominal wall hernias, which were present on the prior CT. To hernias contain small bowel with evidence of partial obstruction at the level of the right periumbilical hernia. Small bowel obstructive changes are less pronounced than on the prior CT. No evidence of bowel wall edema or inflammation. 5. No other acute abnormality within the abdomen or pelvis. 6. Nonspecific diffuse subcutaneous soft tissue edema. 7. Other chronic findings as detailed stable from the prior CT. Electronically Signed   By: Lajean Manes M.D.   On: 07/06/2022 14:01   DG Chest Port 1 View  Result Date: 07/06/2022 CLINICAL DATA:  Short of breath. EXAM: PORTABLE CHEST 1 VIEW COMPARISON:  03/09/2022. FINDINGS: Moderate left and small right pleural effusions obscure the hemidiaphragms, part of the right and most of the left cardiac borders. Additional opacity at the lung bases is likely atelectasis. Mid to upper right and upper left lungs are clear. No pneumothorax. Cardiac silhouette is mostly obscured. 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 No convincing mediastinal or hilar masses. Skeletal structures are grossly intact. IMPRESSION: 1. Moderate left and small right pleural effusions associated with lung base opacity, the latter finding consistent with atelectasis. Consider pneumonia if there are consistent clinical findings.  No evidence of pulmonary edema. 2. Pleural effusions have significantly increased when compared to the prior chest radiograph. Electronically Signed   By: Lajean Manes M.D.   On: 07/06/2022 13:04        Scheduled Meds:  divalproex  125 mg Oral BID   docusate sodium  100 mg Oral QODAY   empagliflozin  10 mg Oral q morning   finasteride  5 mg Oral Daily   insulin aspart  0-9 Units Subcutaneous TID WC   latanoprost  1 drop Both Eyes QHS   melatonin  3 mg Oral QHS   metoprolol succinate  25 mg Oral Daily   pantoprazole  40 mg Oral Daily   QUEtiapine   25 mg Oral QHS   Rivaroxaban  15 mg Oral Q supper   sodium chloride flush  3 mL Intravenous Q12H   tamsulosin  0.4 mg Oral Daily   Continuous Infusions:  sodium chloride       LOS: 0 days   Time spent= 35 mins    [name] Arsenio Loader, MD [facility]  If 7PM-7AM, please contact night-coverage  07/07/2022, 8:11 AM

## 2022-07-08 ENCOUNTER — Inpatient Hospital Stay (HOSPITAL_COMMUNITY): Payer: No Typology Code available for payment source

## 2022-07-08 ENCOUNTER — Ambulatory Visit (INDEPENDENT_AMBULATORY_CARE_PROVIDER_SITE_OTHER): Payer: PPO

## 2022-07-08 ENCOUNTER — Telehealth: Payer: Self-pay | Admitting: Family Medicine

## 2022-07-08 DIAGNOSIS — I5033 Acute on chronic diastolic (congestive) heart failure: Secondary | ICD-10-CM | POA: Diagnosis not present

## 2022-07-08 DIAGNOSIS — I495 Sick sinus syndrome: Secondary | ICD-10-CM

## 2022-07-08 LAB — CUP PACEART REMOTE DEVICE CHECK
Battery Remaining Longevity: 168 mo
Battery Voltage: 3.08 V
Brady Statistic RV Percent Paced: 15.32 %
Date Time Interrogation Session: 20240217160701
Implantable Lead Connection Status: 753985
Implantable Lead Implant Date: 20060510
Implantable Lead Location: 753860
Implantable Lead Model: 5076
Implantable Pulse Generator Implant Date: 20230918
Lead Channel Impedance Value: 247 Ohm
Lead Channel Impedance Value: 342 Ohm
Lead Channel Pacing Threshold Amplitude: 0.875 V
Lead Channel Pacing Threshold Pulse Width: 0.4 ms
Lead Channel Sensing Intrinsic Amplitude: 12.5 mV
Lead Channel Sensing Intrinsic Amplitude: 12.5 mV
Lead Channel Setting Pacing Amplitude: 2 V
Lead Channel Setting Pacing Pulse Width: 0.4 ms
Lead Channel Setting Sensing Sensitivity: 1.2 mV
Zone Setting Status: 755011

## 2022-07-08 LAB — MAGNESIUM: Magnesium: 2.3 mg/dL (ref 1.7–2.4)

## 2022-07-08 LAB — BASIC METABOLIC PANEL
Anion gap: 8 (ref 5–15)
BUN: 33 mg/dL — ABNORMAL HIGH (ref 8–23)
CO2: 23 mmol/L (ref 22–32)
Calcium: 7.7 mg/dL — ABNORMAL LOW (ref 8.9–10.3)
Chloride: 103 mmol/L (ref 98–111)
Creatinine, Ser: 2.05 mg/dL — ABNORMAL HIGH (ref 0.61–1.24)
GFR, Estimated: 29 mL/min — ABNORMAL LOW (ref >60)
Glucose, Bld: 89 mg/dL (ref 70–99)
Potassium: 4.3 mmol/L (ref 3.5–5.1)
Sodium: 134 mmol/L — ABNORMAL LOW (ref 135–145)

## 2022-07-08 LAB — GLUCOSE, CAPILLARY
Glucose-Capillary: 96 mg/dL (ref 70–99)
Glucose-Capillary: 118 mg/dL — ABNORMAL HIGH (ref 70–99)
Glucose-Capillary: 136 mg/dL — ABNORMAL HIGH (ref 70–99)
Glucose-Capillary: 86 mg/dL (ref 70–99)

## 2022-07-08 LAB — CBC
HCT: 25.8 % — ABNORMAL LOW (ref 39.0–52.0)
Hemoglobin: 8.6 g/dL — ABNORMAL LOW (ref 13.0–17.0)
MCH: 25.4 pg — ABNORMAL LOW (ref 26.0–34.0)
MCHC: 33.3 g/dL (ref 30.0–36.0)
MCV: 76.1 fL — ABNORMAL LOW (ref 80.0–100.0)
Platelets: 131 10*3/uL — ABNORMAL LOW (ref 150–400)
RBC: 3.39 MIL/uL — ABNORMAL LOW (ref 4.22–5.81)
RDW: 17.2 % — ABNORMAL HIGH (ref 11.5–15.5)
WBC: 9.1 10*3/uL (ref 4.0–10.5)
nRBC: 0 % (ref 0.0–0.2)

## 2022-07-08 NOTE — Progress Notes (Signed)
PROGRESS NOTE    [name]  [id number] DOB: 11-15-1923 DOA: 07/06/2022 PCP: [name], MD   Brief Narrative:  89 year old with history of A-fib, CKD stage IIIb, prostate cancer, HTN, PPM, history of CVA, diastolic CHF, DM2, advanced Alzheimer's dementia presents to the hospital for ongoing shortness of breath and dyspnea on exertion, poor oral intake.  In ER COVID/flu/RSV were negative, BNP 365.  Chest x-ray showed concerns of moderate left-sided pleural effusion.  CT abdomen pelvis also showed moderate pericardial effusion and moderate left and right-sided pleural effusion.  Case was discussed by EDP with cardiology who recommended obtaining repeat echocardiogram which showed moderate pericardial effusion without tamponade etiology.  IV Lasix was started.  Assessment & Plan:  Principal Problem:   Acute on chronic diastolic CHF (congestive heart failure) ([name]) Active Problems:   Pleural effusion   Ventral hernia   BRADYCARDIA-TACHYCARDIA SYNDROME s/p PPM   Atrial fibrillation ([name])   Type 2 diabetes mellitus without complication, without long-term current use of insulin ([name])   Stage 3b chronic kidney disease ([name])   Essential hypertension   History of CVA (cerebrovascular accident)   Iron deficiency anemia   Benign prostatic hyperplasia with urinary obstruction   Alzheimer disease ([name])     Assessment and Plan: * Acute on chronic diastolic CHF (congestive heart failure) ([name]) -Echocardiogram in August 2023 showed EF of 60% with mild reduction in RV function.  At that time small pericardial effusion was present but on the CT scan it has progressed to moderate size.  EDP discussed case with Dr. Stanford Breed who recommended repeat echocardiogram and will consult if necessary. - Continue Lasix 20 mg IV daily - Currently on Toprol-XL, Jardiance  Bilateral pleural effusion and pericardial effusion -This was seen on the CT scan and chest x-ray.  Patient currently  remains on room air.  Echocardiogram confirms pericardial effusion without any tamponade etiology.  Continue IV Lasix.  Ordered thoracentesis due to exertional dyspnea.  Ventral hernia -CT scan showing possible obstruction but it is less pronounced than prior CT scan done about a year ago.  Admitting provider discussed case with general surgery who recommends conservative management as patient is not currently overtly symptomatic and okay with p.o. intake.  BRADYCARDIA-TACHYCARDIA SYNDROME s/p PPM PPM interrogation ordered  Telemetry   Atrial fibrillation ([place]) Rate controlled.  Continue toprol-xl and xarelto   Type 2 diabetes mellitus without complication, without long-term current use of insulin ([place]) Actos on hold.  A1c 9.2 Hold actos, continue jardiance SSI and accuchecks qac/hs   Stage 3b chronic kidney disease ([place]) Baseline creatinine 2.0, today 1.9  Essential hypertension Well controlled Continue toprol-xl 65m daily   History of CVA (cerebrovascular accident) Continue Xarelto.  Not on statin  Iron deficiency anemia with microcytosis Continue home oral supplements.  Hemoglobin around baseline of 9.0  Benign prostatic hyperplasia with urinary obstruction Continue proscar and flomax daily   Alzheimer disease ([place] On bedtime Seroquel  Sacral stage II ulcer-present prior to admission Discussed CODE STATUS with the granddaughter and the patient.  Patient transition to DNR/DNI  DVT prophylaxis: Xarelto Code Status: Full code Family Communication: Discussed with granddaughter and patient at bedside Cont hospital for thoracentesis and IV Lasix     Subjective:  Sitting up in the chair.  Does not have any complaints.  Admits that he does have exertional dyspnea  Examination:  Constitutional: Not in acute distress, elderly frail Respiratory: Very diminished breath sounds on the left and some crackles on the right Cardiovascular:  Normal sinus rhythm, no  rubs Abdomen: Nontender nondistended good bowel sounds Musculoskeletal: No edema noted Skin: No rashes seen Neurologic: CN 2-12 grossly intact.  And nonfocal Psychiatric: Normal judgment and insight. Alert and oriented x 3. Normal mood.  External Foley catheter  Objective: Vitals:   07/07/22 0844 07/07/22 1236 07/07/22 2042 07/07/22 2300  BP: 92/72 95/61 105/73 90/62  Pulse: 67 (!) 47 97   Resp: 18 17 18 19  $ Temp: 97.8 F (36.6 C)  (!) 97.5 F (36.4 C) 98.1 F (36.7 C)  TempSrc: Oral  Oral Oral  SpO2: 99% 97% 96% 97%  Weight:    73.1 kg  Height:        Intake/Output Summary (Last 24 hours) at 07/08/2022 1145 Last data filed at 07/08/2022 0900 Gross per 24 hour  Intake 600 ml  Output 350 ml  Net 250 ml   Filed Weights   07/06/22 2035 07/07/22 0500 07/07/22 2300  Weight: 73.6 kg 72.4 kg 73.1 kg     Data Reviewed:   CBC: Recent Labs  Lab 07/06/22 1211 07/07/22 0349 07/08/22 0359  WBC 6.9 8.1 9.1  NEUTROABS 5.2  --   --   HGB 9.5* 9.0* 8.6*  HCT 29.7* 27.6* 25.8*  MCV 78.2* 76.2* 76.1*  PLT 152 138* [id number]*   Basic Metabolic Panel: Recent Labs  Lab 07/06/22 1211 07/06/22 1754 07/07/22 0349 07/08/22 0359  NA 136  --  137 134*  K 4.5  --  4.4 4.3  CL 105  --  104 103  CO2 25  --  24 23  GLUCOSE 194*  --  115* 89  BUN 27*  --  27* 33*  CREATININE 1.79*  --  1.92* 2.05*  CALCIUM 7.6*  --  7.9* 7.7*  MG  --  2.3  --  2.3   GFR: Estimated Creatinine Clearance: 21.8 mL/min (A) (by C-G formula based on SCr of 2.05 mg/dL (H)). Liver Function Tests: Recent Labs  Lab 07/06/22 1211  AST 18  ALT 8  ALKPHOS 55  BILITOT 0.4  PROT 6.2*  ALBUMIN 3.0*   No results for input(s): "LIPASE", "AMYLASE" in the last 168 hours. No results for input(s): "AMMONIA" in the last 168 hours. Coagulation Profile: Recent Labs  Lab 07/06/22 1754  INR 1.6*   Cardiac Enzymes: No results for input(s): "CKTOTAL", "CKMB", "CKMBINDEX", "TROPONINI" in the last 168 hours. BNP  (last 3 results) No results for input(s): "PROBNP" in the last 8760 hours. HbA1C: Recent Labs    07/06/22 1627  HGBA1C 9.2*   CBG: Recent Labs  Lab 07/07/22 0751 07/07/22 1146 07/07/22 1711 07/07/22 2136 07/08/22 0735  GLUCAP 151* 150* 191* 71 96   Lipid Profile: No results for input(s): "CHOL", "HDL", "LDLCALC", "TRIG", "CHOLHDL", "LDLDIRECT" in the last 72 hours. Thyroid Function Tests: Recent Labs    07/06/22 1630  TSH 4.549*   Anemia Panel: No results for input(s): "VITAMINB12", "FOLATE", "FERRITIN", "TIBC", "IRON", "RETICCTPCT" in the last 72 hours. Sepsis Labs: Recent Labs  Lab 07/06/22 1627 07/06/22 2115 07/07/22 0349  LATICACIDVEN 1.3 3.3* 1.4    Recent Results (from the past 240 hour(s))  Resp panel by RT-PCR (RSV, Flu A&B, Covid) Anterior Nasal Swab     Status: None   Collection Time: 07/06/22 12:12 PM   Specimen: Anterior Nasal Swab  Result Value Ref Range Status   SARS Coronavirus 2 by RT PCR NEGATIVE NEGATIVE Final    Comment: (NOTE) SARS-CoV-2 target nucleic acids are NOT DETECTED.  The SARS-CoV-2 RNA is generally detectable in upper respiratory specimens during the acute phase of infection. The lowest concentration of SARS-CoV-2 viral copies this assay can detect is 138 copies/mL. A negative result does not preclude SARS-Cov-2 infection and should not be used as the sole basis for treatment or other patient management decisions. A negative result may occur with  improper specimen collection/handling, submission of specimen other than nasopharyngeal swab, presence of viral mutation(s) within the areas targeted by this assay, and inadequate number of viral copies(<138 copies/mL). A negative result must be combined with clinical observations, patient history, and epidemiological information. The expected result is Negative.  Fact Sheet for Patients:  EntrepreneurPulse.com.au  Fact Sheet for Healthcare Providers:   IncredibleEmployment.be  This test is no t yet approved or cleared by the [place] FDA and  has been authorized for detection and/or diagnosis of SARS-CoV-2 by FDA under an Emergency Use Authorization (EUA). This EUA will remain  in effect (meaning this test can be used) for the duration of the COVID-19 declaration under Section 564(b)(1) of the Act, 21 U.S.C.section 360bbb-3(b)(1), unless the authorization is terminated  or revoked sooner.       Influenza A by PCR NEGATIVE NEGATIVE Final   Influenza B by PCR NEGATIVE NEGATIVE Final    Comment: (NOTE) The Xpert Xpress SARS-CoV-2/FLU/RSV plus assay is intended as an aid in the diagnosis of influenza from Nasopharyngeal swab specimens and should not be used as a sole basis for treatment. Nasal washings and aspirates are unacceptable for Xpert Xpress SARS-CoV-2/FLU/RSV testing.  Fact Sheet for Patients: EntrepreneurPulse.com.au  Fact Sheet for Healthcare Providers: IncredibleEmployment.be  This test is not yet approved or cleared by the [place] FDA and has been authorized for detection and/or diagnosis of SARS-CoV-2 by FDA under an Emergency Use Authorization (EUA). This EUA will remain in effect (meaning this test can be used) for the duration of the COVID-19 declaration under Section 564(b)(1) of the Act, 21 U.S.C. section 360bbb-3(b)(1), unless the authorization is terminated or revoked.     Resp Syncytial Virus by PCR NEGATIVE NEGATIVE Final    Comment: (NOTE) Fact Sheet for Patients: EntrepreneurPulse.com.au  Fact Sheet for Healthcare Providers: IncredibleEmployment.be  This test is not yet approved or cleared by the [place] FDA and has been authorized for detection and/or diagnosis of SARS-CoV-2 by FDA under an Emergency Use Authorization (EUA). This EUA will remain in effect (meaning this test can be used) for  the duration of the COVID-19 declaration under Section 564(b)(1) of the Act, 21 U.S.C. section 360bbb-3(b)(1), unless the authorization is terminated or revoked.  Performed at [facility] [place] [address] [place], [place] [address]          Radiology Studies: DG Chest 2 View  Result Date: 07/07/2022 CLINICAL DATA:  89 year old male history of pleural effusion. EXAM: CHEST - 2 VIEW COMPARISON:  Chest x-ray 07/06/2022. FINDINGS: Opacity at the left base which may reflect atelectasis and/or consolidation with superimposed moderate left pleural effusion, slightly decreased compared to the prior study. Previously noted right pleural effusion is decreased in size, now small, with additional areas of atelectasis and/or consolidation in the base of the right lung. No pneumothorax. No evidence of pulmonary edema. Cardiac silhouette is largely obscured. Upper mediastinal contours are within normal limits allowing for patient rotation to the left. Atherosclerotic calcifications in the thoracic aorta. Left-sided pacemaker with lead tips projecting over the expected location of the right atrium and right ventricle. Numerous surgical clips project over  the upper abdomen. IMPRESSION: 1. Slight decreased size of small right and moderate left pleural effusions with persistent bibasilar areas of atelectasis and/or consolidation (left-greater-than-right). 2. Aortic atherosclerosis. Electronically Signed   By: Vinnie Langton M.D.   On: 07/07/2022 11:01   ECHOCARDIOGRAM COMPLETE  Result Date: 07/07/2022    ECHOCARDIOGRAM REPORT   Patient Name:   [name] [facility] Date of Exam: 07/07/2022 Medical Rec #:  [id number]        Height:       70.0 in Accession #:    [id number]       Weight:       159.7 lb Date of Birth:  Jun 29, 1923       BSA:          1.897 m Patient Age:    31 years         BP:           100/70 mmHg Patient Gender: M                HR:           84 bpm. Exam Location:  Inpatient  Procedure: 2D Echo, Cardiac Doppler, Color Doppler and Intracardiac            Opacification Agent Indications:    I31.3 Pericardial effusion (noninflammatory)  History:        Patient has prior history of Echocardiogram examinations.                 Stroke, Arrythmias:Atrial Fibrillation; Risk                 Factors:Hypertension and Diabetes.  Sonographer:    Phineas Douglas Referring Phys: [id number] Frannie  1. Left ventricular ejection fraction, by estimation, is 50 to 55%. The left ventricle has low normal function. The left ventricle has no regional wall motion abnormalities. There is severe left ventricular hypertrophy. Left ventricular diastolic parameters are indeterminate.  2. Pacing wire in RA/RV . Right ventricular systolic function is normal. The right ventricular size is normal. There is normal pulmonary artery systolic pressure.  3. Left atrial size was moderately dilated.  4. IVC dilated but does reduce on inpspiration no RARV diastolic collapse Effuson appears larger than seen on TTE 12/18/21 . Moderate pericardial effusion. The pericardial effusion is near circumferential.  5. The mitral valve is abnormal. Mild mitral valve regurgitation. No evidence of mitral stenosis.  6. The aortic valve is tricuspid. There is moderate calcification of the aortic valve. There is moderate thickening of the aortic valve. Aortic valve regurgitation is mild. Aortic valve sclerosis is present, with no evidence of aortic valve stenosis.  7. Aortic dilatation noted. There is mild dilatation of the aortic root, measuring 40 mm.  8. The inferior vena cava is normal in size with greater than 50% respiratory variability, suggesting right atrial pressure of 3 mmHg. FINDINGS  Left Ventricle: Left ventricular ejection fraction, by estimation, is 50 to 55%. The left ventricle has low normal function. The left ventricle has no regional wall motion abnormalities. The left ventricular internal cavity size was normal  in size. There is severe left ventricular hypertrophy. Left ventricular diastolic parameters are indeterminate. Right Ventricle: Pacing wire in RA/RV. The right ventricular size is normal. No increase in right ventricular wall thickness. Right ventricular systolic function is normal. There is normal pulmonary artery systolic pressure. The tricuspid regurgitant velocity is 2.57 m/s, and with an assumed right atrial pressure of 3 mmHg, the estimated right  ventricular systolic pressure is [id number]. Left Atrium: Left atrial size was moderately dilated. Right Atrium: Right atrial size was normal in size. Pericardium: IVC dilated but does reduce on inpspiration no RARV diastolic collapse Effuson appears larger than seen on TTE 12/18/21. A moderately sized pericardial effusion is present. The pericardial effusion is near circumferential. Mitral Valve: The mitral valve is abnormal. There is mild thickening of the mitral valve leaflet(s). There is mild calcification of the mitral valve leaflet(s). Mild mitral annular calcification. Mild mitral valve regurgitation. No evidence of mitral valve stenosis. Tricuspid Valve: The tricuspid valve is normal in structure. Tricuspid valve regurgitation is mild . No evidence of tricuspid stenosis. Aortic Valve: The aortic valve is tricuspid. There is moderate calcification of the aortic valve. There is moderate thickening of the aortic valve. Aortic valve regurgitation is mild. Aortic valve sclerosis is present, with no evidence of aortic valve stenosis. Pulmonic Valve: The pulmonic valve was normal in structure. Pulmonic valve regurgitation is not visualized. No evidence of pulmonic stenosis. Aorta: Aortic dilatation noted. There is mild dilatation of the aortic root, measuring 40 mm. Venous: The inferior vena cava is normal in size with greater than 50% respiratory variability, suggesting right atrial pressure of 3 mmHg. IAS/Shunts: No atrial level shunt detected by color flow Doppler.   LEFT VENTRICLE PLAX 2D LVIDd:         4.30 cm   Diastology LVIDs:         3.10 cm   LV e' medial:    5.17 cm/s LV PW:         1.50 cm   LV E/e' medial:  10.9 LV IVS:        1.60 cm   LV e' lateral:   6.66 cm/s LVOT diam:     1.90 cm   LV E/e' lateral: 8.5 LV SV:         35 LV SV Index:   19 LVOT Area:     2.84 cm  RIGHT VENTRICLE            IVC RV Basal diam:  3.80 cm    IVC diam: 2.10 cm RV S prime:     8.41 cm/s TAPSE (M-mode): 1.1 cm LEFT ATRIUM              Index        RIGHT ATRIUM           Index LA diam:        4.40 cm  2.32 cm/m   RA Area:     19.90 cm LA Vol (A2C):   100.0 ml 52.72 ml/m  RA Volume:   53.30 ml  28.10 ml/m LA Vol (A4C):   94.4 ml  49.76 ml/m LA Biplane Vol: 97.2 ml  51.24 ml/m  AORTIC VALVE LVOT Vmax:   64.00 cm/s LVOT Vmean:  40.300 cm/s LVOT VTI:    0.124 m  AORTA Ao Root diam: 4.00 cm Ao Asc diam:  3.70 cm MITRAL VALVE               TRICUSPID VALVE MV Area (PHT): 2.60 cm    TR Peak grad:   26.4 mmHg MV Decel Time: 292 msec    TR Vmax:        257.00 cm/s MV E velocity: 56.60 cm/s                            SHUNTS  Systemic VTI:  0.12 m                            Systemic Diam: 1.90 cm Jenkins Rouge MD Electronically signed by Jenkins Rouge MD Signature Date/Time: 07/07/2022/10:10:07 AM    Final    CT CHEST ABDOMEN PELVIS WO CONTRAST  Result Date: 07/06/2022 CLINICAL DATA:  Abdominal pain. No bowel movement for 4 days. Also with shortness of breath and pleural effusions. History of small-bowel obstruction. EXAM: CT CHEST, ABDOMEN AND PELVIS WITHOUT CONTRAST TECHNIQUE: Multidetector CT imaging of the chest, abdomen and pelvis was performed following the standard protocol without IV contrast. RADIATION DOSE REDUCTION: This exam was performed according to the departmental dose-optimization program which includes automated exposure control, adjustment of the mA and/or kV according to patient size and/or use of iterative reconstruction technique. COMPARISON:   07/29/2021.  Current chest radiograph. FINDINGS: CT CHEST FINDINGS Cardiovascular: Heart top-normal in size. Moderate pericardial effusion. Three-vessel coronary artery calcifications. Right ventricle and right atrial pacer leads are well positioned. Thoracic aorta normal in caliber. Mildly enlarged main pulmonary arteries, right 3 cm, left 3.2 cm. Mild aortic atherosclerosis. Mediastinum/Nodes: No neck base, mediastinal or hilar masses. No enlarged lymph nodes. Trachea unremarkable. Mild distension of the esophagus. Small hiatal hernia. Lungs/Pleura: Moderate left and small to moderate right pleural effusions. Dependent lung opacities, most evident in the left lower lobe, consistent with atelectasis. No convincing pneumonia. No pulmonary edema. Airways widely patent. 2-3 mm nodule, right upper lobe, image 58/6. Several small right lung calcified granuloma. Mild to moderate centrilobular and paraseptal emphysema. No pneumothorax. Musculoskeletal: No acute fractures. Old posterior left rib fractures. No bone lesion. No chest wall mass. CT ABDOMEN PELVIS FINDINGS Hepatobiliary: No focal liver abnormality is seen. Status post cholecystectomy. No biliary dilatation. Pancreas: Atrophic pancreas with calcifications, but no convincing mass or active inflammation. Duct dilation. Appearance similar to the prior CT. Spleen: Lobulated, relatively small spleen. No splenic mass. Stable appearance. Adrenals/Urinary Tract: No adrenal mass. Right renal cortical thinning. Normal left renal size. Multiple low-attenuation right renal masses consistent with cysts, stable with no follow-up recommended. No stones. No hydronephrosis. Ureters normal in course and in caliber. Bladder minimally distended, otherwise unremarkable. Stomach/Bowel: Right anterior abdominal wall hernias, 2 adjacent hernias in the right mid to upper abdomen for lateral with a base of 2 cm, more medial with a base of approximately 4 cm. Third hernia right  periumbilical, base proximally 2.6 cm. All 3 hernias contain bowel. The smaller right upper abdominal wall hernia contains a knuckle of the right transverse colon. The other 2 hernias contain portions of small bowel. Small bowel distal to the right paraumbilical hernia is decompressed. Small bowel within the 2 hernias and proximal to the hernia in the right mid to lower abdomen is dilated maximum approximately 3.8 cm. No small bowel wall thickening or adjacent inflammation. Small bowel anastomosis staple line noted right pelvis, stable from the prior CT. Vascular/Lymphatic: Aortic atherosclerosis. Low-attenuation mass versus a retrocrural lymph node lies to the right of the upper aorta just below the aortic hiatus, 2.5 x 2.1 cm transversely, unchanged from the prior CT. This may reflect a lymphocele. No definite enlarged lymph nodes. Reproductive: Small prostate, stable. Other: Diffuse subcutaneous edema.  No ascites. Musculoskeletal: No fracture or acute finding.  No bone lesion. IMPRESSION: 1. Moderate pericardial effusion. Moderate left and small to moderate right pleural effusions. Associated dependent atelectasis most evident in the left lower lobe. 2.  No convincing pneumonia and no evidence of pulmonary edema. 3. 3 mm right upper lobe pulmonary nodule. Given the patient's age, in the presence multiple small calcified granuloma, no follow-up recommended. 4. Emphysema. Three anterior abdominal wall hernias, which were present on the prior CT. To hernias contain small bowel with evidence of partial obstruction at the level of the right periumbilical hernia. Small bowel obstructive changes are less pronounced than on the prior CT. No evidence of bowel wall edema or inflammation. 5. No other acute abnormality within the abdomen or pelvis. 6. Nonspecific diffuse subcutaneous soft tissue edema. 7. Other chronic findings as detailed stable from the prior CT. Electronically Signed   By: Lajean Manes M.D.   On:  07/06/2022 14:01   DG Chest Port 1 View  Result Date: 07/06/2022 CLINICAL DATA:  Short of breath. EXAM: PORTABLE CHEST 1 VIEW COMPARISON:  03/09/2022. FINDINGS: Moderate left and small right pleural effusions obscure the hemidiaphragms, part of the right and most of the left cardiac borders. Additional opacity at the lung bases is likely atelectasis. Mid to upper right and upper left lungs are clear. No pneumothorax. Cardiac silhouette is mostly obscured. 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 No convincing mediastinal or hilar masses. Skeletal structures are grossly intact. IMPRESSION: 1. Moderate left and small right pleural effusions associated with lung base opacity, the latter finding consistent with atelectasis. Consider pneumonia if there are consistent clinical findings. No evidence of pulmonary edema. 2. Pleural effusions have significantly increased when compared to the prior chest radiograph. Electronically Signed   By: Lajean Manes M.D.   On: 07/06/2022 13:04        Scheduled Meds:  bisacodyl  5 mg Oral BID   divalproex  125 mg Oral BID   docusate sodium  200 mg Oral Daily   finasteride  5 mg Oral Daily   furosemide  20 mg Intravenous Daily   insulin aspart  0-9 Units Subcutaneous TID AC & HS   latanoprost  1 drop Both Eyes QHS   melatonin  3 mg Oral QHS   metoprolol succinate  25 mg Oral Daily   pantoprazole  40 mg Oral Daily   Rivaroxaban  15 mg Oral Q supper   sodium chloride flush  3 mL Intravenous Q12H   tamsulosin  0.4 mg Oral Daily   Continuous Infusions:  sodium chloride       LOS: 1 day   Time spent= 35 mins    Gwendolen Hewlett Arsenio Loader, MD [facility]  If 7PM-7AM, please contact night-coverage  07/08/2022, 11:45 AM

## 2022-07-08 NOTE — TOC Initial Note (Addendum)
Transition of Care [facility]) - Initial/Assessment Note    Patient Details  Name: [name] MRN: [id number] Date of Birth: [birth year]  Transition [facility]) CM/SW Contact:    Vassie Moselle, LCSW Phone Number: 07/08/2022, 11:14 AM  Clinical Narrative:                 Spoke with pt's granddaughter, [name] via t/c. Pt's grandaughter shares that pt lives at home and has 24/7 caregivers provided by family and friends. Pt receive HHPT and RN for [place] w/ [facility]. Will need ROC orders placed.  Pt also is active with the [place] for home-base care and has HHPT through this program. Pt has rolling walker, wheelchair, cane, and shower seat at home. Pt typically ambulates with cane however, granddaughter shares he should be using the wheelchair.  Pt's daughter has sent message to the [place] to see about getting hospital bed for pt at home. She shares that when pt is laying flat he typically struggles to breathe.  TOC will continue to follow for discharge needs.  Expected Discharge Plan: Home/Self Care Barriers to Discharge: Continued Medical Work up   Patient Goals and CMS Choice Patient states their goals for this hospitalization and ongoing recovery are:: For pt to return home CMS Medicare.gov Compare Post Acute Care list provided to:: Patient Represenative (must comment) (Granddaughter) Choice offered to / list presented to : Adult [place] ownership interest in [facility].provided to:: Adult Children    Expected Discharge Plan and Services In-house Referral: Clinical Social Work Discharge Planning Services: NA Post Acute Care Choice: Durable Medical Equipment [facility] bed) Living arrangements for the past 2 months: [place]                                      Prior Living Arrangements/Services Living arrangements for the past 2 months: Single Family Home Lives with:: Relatives Patient language and need for interpreter reviewed:: Yes Do you  feel safe going back to the place where you live?: Yes      Need for Family Participation in Patient Care: Yes (Comment) Care giver support system in place?: Yes (comment) Current home services: Other (comment), Home PT, Homehealth aide, Home RN (24/7 caregivers (family/friends)) Criminal Activity/Legal Involvement Pertinent to Current Situation/Hospitalization: No - Comment as needed  Activities of Daily Living Home Assistive Devices/Equipment: Wheelchair, Environmental consultant (specify type), Cane (specify quad or straight), Eyeglasses, Grab bars around toilet ADL Screening (condition at time of admission) Patient's cognitive ability adequate to safely complete daily activities?: Yes Is the patient deaf or have difficulty hearing?: No Does the patient have difficulty seeing, even when wearing glasses/contacts?: No Does the patient have difficulty concentrating, remembering, or making decisions?: Yes Patient able to express need for assistance with ADLs?: Yes Does the patient have difficulty dressing or bathing?: Yes Independently performs ADLs?: No Communication: Independent Dressing (OT): Needs assistance Is this a change from baseline?: Pre-admission baseline Grooming: Needs assistance Is this a change from baseline?: Pre-admission baseline Feeding: Independent Bathing: Needs assistance Is this a change from baseline?: Pre-admission baseline Toileting: Needs assistance Is this a change from baseline?: Pre-admission baseline In/Out Bed: Dependent Is this a change from baseline?: Pre-admission baseline Walks in Home: Dependent Is this a change from baseline?: Pre-admission baseline Does the patient have difficulty walking or climbing stairs?: Yes Weakness of Legs: Both Weakness of Arms/Hands: Both  Permission Sought/Granted Permission sought  to share information with : Facility Art therapist granted to share information with : Yes, Verbal Permission Granted     Permission  granted to share info w AGENCY: [facility], [facility]        Emotional Assessment   Attitude/Demeanor/Rapport: Unable to Assess Affect (typically observed): Unable to Assess Orientation: : Oriented to Self Alcohol / Substance Use: Not Applicable Psych Involvement: No (comment)  Admission diagnosis:  Pericardial effusion [I31.39] Pleural effusion [J90] Partial small bowel obstruction ([name]) [K56.600] Dyspnea, unspecified type [R06.00] Patient Active Problem List   Diagnosis Date Noted   Essential hypertension 07/06/2022   Alzheimer disease ([place]) 07/06/2022   Pleural effusion 07/06/2022   History of CVA (cerebrovascular accident) 07/06/2022   Ventral hernia 07/06/2022   Ulcer of lower extremity, limited to breakdown of skin ([place]) 05/30/2022   Urinary frequency 02/14/2022   Hypotension due to drugs 02/07/2022   Facial laceration 02/07/2022   Need for influenza vaccination 02/07/2022   GI bleed 12/31/2021   Anemia due to chronic kidney disease 12/31/2021   Agitation due to dementia ([place]) 12/25/2021   Benign prostatic hyperplasia with urinary obstruction 12/05/2021   Lower extremity edema 12/05/2021   Scrotum swelling 12/05/2021   Elevated homocysteine 11/30/2021   Low magnesium level 11/30/2021   Balanitis 11/30/2021   Chronic atrial fibrillation with RVR ([place]) 07/30/2021   Hypophosphatemia 07/29/2021   Stage 3b chronic kidney disease ([place]) 04/25/2021   Continuous leakage of urine 04/25/2021   At high risk for injury related to fall 04/25/2021   Hyperkalemia 11/05/2020   Hypocalcemia 10/30/2020   Vitamin D deficiency 10/30/2020   Edema due to malnutrition ([place]) 10/30/2020   Unsteady gait when walking 09/21/2020   Sarcopenia 09/21/2020   Driving safety issue 09/21/2020   B12 deficiency 01/27/2020   Acute on chronic diastolic CHF (congestive heart failure) ([place]) 03/09/2019   Pain in both feet 11/17/2018   Neuropathy 10/09/2018   Insomnia 10/09/2018   Slow transit constipation  06/18/2018   Syncope 12/03/2017   Dehydration    AKI (acute kidney injury) ([place]) 12/01/2017   Iron deficiency anemia 11/25/2017   Decreased appetite 10/16/2017   Hearing difficulty of both ears 10/16/2017   Allergic rhinitis due to pollen 09/03/2017   Ventricular tachycardia ([place]) 01/14/2017   Malnutrition of moderate degree 01/13/2017   DOE (dyspnea on exertion) 01/12/2017   Small bowel obstruction ([place]) 01/12/2017   PVD (peripheral vascular disease) ([place]) 08/14/2016   CVA (cerebral infarction) 07/23/2015   Microcytic anemia 07/23/2015   Stroke (cerebrum) ([place]) 07/23/2015   Chronic kidney disease 07/23/2015   Acute CVA (cerebrovascular accident) ([place])    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Phone: [phone]: [id number]: [phone]: [phone]    Social Determinants of Health (SDOH) Social History: SDOH Screenings   Food Insecurity: No Food Insecurity (07/06/2022)  Housing: Low Risk  (07/06/2022)  Transportation Needs: No Transportation Needs (07/06/2022)  Utilities: Not At Risk (07/06/2022)  Depression (PHQ2-9): Low Risk  (06/13/2022)  Financial Resource Strain: Low Risk  (  05/14/2022)  Physical Activity: Insufficiently Active (05/14/2022)  Stress: No Stress Concern Present (05/14/2022)  Tobacco Use: Medium Risk (07/06/2022)   SDOH Interventions:     Readmission Risk Interventions    07/08/2022   11:12 AM   Readmission Risk Prevention Plan  Transportation Screening Complete  Medication Review ([place]) Complete  PCP or Specialist appointment within 3-5 days of discharge Complete  HRI or [place] Complete  SW Recovery Care/Counseling Consult Complete  [place] Not Applicable

## 2022-07-08 NOTE — Plan of Care (Signed)
  Problem: Education: Goal: Ability to describe self-care measures that may prevent or decrease complications (Diabetes Survival Skills Education) will improve Outcome: Progressing Goal: Individualized Educational Video(s) Outcome: Progressing   Problem: Coping: Goal: Ability to adjust to condition or change in health will improve Outcome: Progressing   Problem: Fluid Volume: Goal: Ability to maintain a balanced intake and output will improve Outcome: Progressing   Problem: Health Behavior/Discharge Planning: Goal: Ability to identify and utilize available resources and services will improve Outcome: Progressing Goal: Ability to manage health-related needs will improve Outcome: Progressing   Problem: Metabolic: Goal: Ability to maintain appropriate glucose levels will improve Outcome: Progressing   Problem: Nutritional: Goal: Maintenance of adequate nutrition will improve Outcome: Progressing Goal: Progress toward achieving an optimal weight will improve Outcome: Progressing   Problem: Skin Integrity: Goal: Risk for impaired skin integrity will decrease Outcome: Progressing   Problem: Tissue Perfusion: Goal: Adequacy of tissue perfusion will improve Outcome: Progressing   Problem: Education: Goal: Ability to demonstrate management of disease process will improve Outcome: Progressing Goal: Ability to verbalize understanding of medication therapies will improve Outcome: Progressing Goal: Individualized Educational Video(s) Outcome: Progressing   Problem: Activity: Goal: Capacity to carry out activities will improve Outcome: Progressing   Problem: Cardiac: Goal: Ability to achieve and maintain adequate cardiopulmonary perfusion will improve Outcome: Progressing   Problem: Education: Goal: Knowledge of General Education information will improve Description: Including pain rating scale, medication(s)/side effects and non-pharmacologic comfort measures Outcome:  Progressing   Problem: Health Behavior/Discharge Planning: Goal: Ability to manage health-related needs will improve Outcome: Progressing   Problem: Clinical Measurements: Goal: Ability to maintain clinical measurements within normal limits will improve Outcome: Progressing Goal: Will remain free from infection Outcome: Progressing Goal: Diagnostic test results will improve Outcome: Progressing Goal: Respiratory complications will improve Outcome: Progressing Goal: Cardiovascular complication will be avoided Outcome: Progressing   Problem: Activity: Goal: Risk for activity intolerance will decrease Outcome: Progressing   Problem: Nutrition: Goal: Adequate nutrition will be maintained Outcome: Progressing   Problem: Coping: Goal: Level of anxiety will decrease Outcome: Progressing   Problem: Elimination: Goal: Will not experience complications related to bowel motility Outcome: Progressing Goal: Will not experience complications related to urinary retention Outcome: Progressing   Problem: Pain Managment: Goal: General experience of comfort will improve Outcome: Progressing   Problem: Safety: Goal: Ability to remain free from injury will improve Outcome: Progressing   Problem: Skin Integrity: Goal: Risk for impaired skin integrity will decrease Outcome: Progressing

## 2022-07-08 NOTE — Telephone Encounter (Signed)
Caller Name: [facility] [employer] Call back phone #: [phone]  Reason for Call: A diagnosis of diabetes or congestive heart failure is needed to continue his care. Please call anyone can take your response.

## 2022-07-08 NOTE — Progress Notes (Signed)
Mobility Specialist - Progress Note   07/08/22 1358  Mobility  Activity Transferred from chair to bed  Level of Assistance Contact guard assist, steadying assist  Assistive Device Front wheel walker  Range of Motion/Exercises Active  Activity Response Tolerated well  $Mobility charge 1 Mobility   Pt was found on recliner chair and agreeable to ambulate. X-ray to pt's room and therefore decided to transfer pt to bed. Pt was contact assist for transfer and was left in bed. RN aware.  Ferd Hibbs Mobility Specialist

## 2022-07-09 ENCOUNTER — Inpatient Hospital Stay (HOSPITAL_COMMUNITY): Payer: No Typology Code available for payment source

## 2022-07-09 DIAGNOSIS — I5033 Acute on chronic diastolic (congestive) heart failure: Secondary | ICD-10-CM | POA: Diagnosis not present

## 2022-07-09 LAB — BASIC METABOLIC PANEL
Anion gap: 10 (ref 5–15)
BUN: 33 mg/dL — ABNORMAL HIGH (ref 8–23)
CO2: 24 mmol/L (ref 22–32)
Calcium: 7.9 mg/dL — ABNORMAL LOW (ref 8.9–10.3)
Chloride: 101 mmol/L (ref 98–111)
Creatinine, Ser: 2.03 mg/dL — ABNORMAL HIGH (ref 0.61–1.24)
GFR, Estimated: 29 mL/min — ABNORMAL LOW (ref >60)
Glucose, Bld: 118 mg/dL — ABNORMAL HIGH (ref 70–99)
Potassium: 4.7 mmol/L (ref 3.5–5.1)
Sodium: 135 mmol/L (ref 135–145)

## 2022-07-09 LAB — GLUCOSE, PLEURAL OR PERITONEAL FLUID: Glucose, Fluid: 108 mg/dL

## 2022-07-09 LAB — CBC
HCT: 27.5 % — ABNORMAL LOW (ref 39.0–52.0)
Hemoglobin: 8.9 g/dL — ABNORMAL LOW (ref 13.0–17.0)
MCH: 24.9 pg — ABNORMAL LOW (ref 26.0–34.0)
MCHC: 32.4 g/dL (ref 30.0–36.0)
MCV: 76.8 fL — ABNORMAL LOW (ref 80.0–100.0)
Platelets: 121 10*3/uL — ABNORMAL LOW (ref 150–400)
RBC: 3.58 MIL/uL — ABNORMAL LOW (ref 4.22–5.81)
RDW: 17.2 % — ABNORMAL HIGH (ref 11.5–15.5)
WBC: 7.2 10*3/uL (ref 4.0–10.5)
nRBC: 0 % (ref 0.0–0.2)

## 2022-07-09 LAB — BODY FLUID CELL COUNT WITH DIFFERENTIAL
Lymphs, Fluid: 22 %
Monocyte-Macrophage-Serous Fluid: 68 % (ref 50–90)
Neutrophil Count, Fluid: 10 % (ref 0–25)
Total Nucleated Cell Count, Fluid: 232 cu mm (ref 0–1000)

## 2022-07-09 LAB — GLUCOSE, CAPILLARY
Glucose-Capillary: 141 mg/dL — ABNORMAL HIGH (ref 70–99)
Glucose-Capillary: 87 mg/dL (ref 70–99)
Glucose-Capillary: 74 mg/dL (ref 70–99)
Glucose-Capillary: 187 mg/dL — ABNORMAL HIGH (ref 70–99)

## 2022-07-09 LAB — PROTEIN, PLEURAL OR PERITONEAL FLUID: Total protein, fluid: <3 g/dL

## 2022-07-09 LAB — LACTATE DEHYDROGENASE, PLEURAL OR PERITONEAL FLUID: LD, Fluid: 61 U/L — ABNORMAL HIGH (ref 3–23)

## 2022-07-09 LAB — ALBUMIN, PLEURAL OR PERITONEAL FLUID: Albumin, Fluid: 1.5 g/dL

## 2022-07-09 LAB — MAGNESIUM: Magnesium: 2.4 mg/dL (ref 1.7–2.4)

## 2022-07-09 LAB — GRAM STAIN: Gram Stain: NONE SEEN

## 2022-07-09 MED ORDER — LIDOCAINE HCL 1 % IJ SOLN
INTRAMUSCULAR | Status: AC
  Administered 2022-07-09: 10 mL
  Filled 2022-07-09: qty 20

## 2022-07-09 NOTE — Progress Notes (Signed)
PROGRESS NOTE    DEIVI OLSHAN  [id number] DOB: [birth year] DOA: 07/06/2022 PCP: [name], MD   Brief Narrative:  89 year old with history of A-fib, CKD stage IIIb, prostate cancer, HTN, PPM, history of CVA, diastolic CHF, DM2, advanced Alzheimer's dementia presents to the hospital for ongoing shortness of breath and dyspnea on exertion, poor oral intake.  In ER COVID/flu/RSV were negative, BNP 365.  Chest x-ray showed concerns of moderate left-sided pleural effusion.  CT abdomen pelvis also showed moderate pericardial effusion and moderate left and right-sided pleural effusion.  Case was discussed by EDP with cardiology who recommended obtaining repeat echocardiogram which showed moderate pericardial effusion without tamponade etiology.  IV Lasix was started.  Thoracentesis performed on 2/20, about 1.3 L removed.  Assessment & Plan:  Principal Problem:   Acute on chronic diastolic CHF (congestive heart failure) ([name]) Active Problems:   Pleural effusion   Ventral hernia   BRADYCARDIA-TACHYCARDIA SYNDROME s/p PPM   Atrial fibrillation ([name])   Type 2 diabetes mellitus without complication, without long-term current use of insulin ([name])   Stage 3b chronic kidney disease ([name])   Essential hypertension   History of CVA (cerebrovascular accident)   Iron deficiency anemia   Benign prostatic hyperplasia with urinary obstruction   Alzheimer disease ([name])     Assessment and Plan: * Acute on chronic diastolic CHF (congestive heart failure) ([place]); Class 2 -Echocardiogram in August 2023 showed EF of 60% with mild reduction in RV function.  At that time small pericardial effusion was present but on the CT scan it has progressed to moderate size.  EDP discussed case with Dr. Stanford Breed who recommended repeat echocardiogram- stable ef 55%/  - Continue Lasix 20 mg IV daily - Currently on Toprol-XL  Bilateral pleural effusion and pericardial effusion; stable.  -This was seen on the  CT scan and chest x-ray.  Patient currently remains on room air.  Echocardiogram confirms pericardial effusion without any tamponade etiology.  Continue IV Lasix.  Thoracentesis performed today, 1.3 L removed  Ventral hernia -CT scan showing possible obstruction but it is less pronounced than prior CT scan done about a year ago.  Admitting provider discussed case with general surgery who recommends conservative management as patient is not currently overtly symptomatic and okay with p.o. intake.Abdominal Binder.   Constipation -Multiple bowel regimen, tap water helped.   BRADYCARDIA-TACHYCARDIA SYNDROME s/p PPM PPM interrogation ordered  Telemetry   Atrial fibrillation ([place]) Rate controlled.  Continue toprol-xl and xarelto   Type 2 diabetes mellitus without complication, without long-term current use of insulin ([place]) Actos on hold.  A1c 9.2 Hold actos, Per family no longer on Jardiance.  SSI and accuchecks qac/hs   Stage 3b chronic kidney disease ([place]) Baseline creatinine 2.0, today 2.0  Essential hypertension Well controlled Continue toprol-xl 32m daily   History of CVA (cerebrovascular accident) Continue Xarelto.  Not on statin  Iron deficiency anemia with microcytosis Continue home oral supplements.  Hemoglobin around baseline of 9.0  Benign prostatic hyperplasia with urinary obstruction Continue proscar and flomax daily   Alzheimer disease ([place] On bedtime Seroquel  Sacral stage II ulcer-present prior to admission Discussed CODE STATUS with the granddaughter and the patient.  Patient transition to DNR/DNI In my opinion patient would benefit from outpatient referral to hospice care services given his advanced age and comorbidities.   DVT prophylaxis: Xarelto Code Status: Full code Family Communication: Discussed with granddaughter  Status post thoracentesis, getting IV Lasix.  Hopefully home tomorrow  Subjective: Seen and examined after thoracentesis,  feeling little better.  No complaints   Examination: Constitutional: Not in acute distress, elderly frail Respiratory: Diminished breath sounds at the bases Cardiovascular: Normal sinus rhythm, no rubs Abdomen: Nontender nondistended good bowel sounds Musculoskeletal: No edema noted Skin: No rashes seen Neurologic: CN 2-12 grossly intact.  And nonfocal Psychiatric: Normal judgment and insight. Alert and oriented x 3. Normal mood.  External Foley catheter  Objective: Vitals:   07/07/22 2300 07/08/22 1254 07/08/22 2103 07/09/22 0421  BP: 90/62 98/66 99/71 $ 90/62  Pulse:   78 92  Resp: 19 16 18 18  $ Temp: 98.1 F (36.7 C) 97.8 F (36.6 C) 98.3 F (36.8 C) 98.5 F (36.9 C)  TempSrc: Oral Oral Oral Oral  SpO2: 97% 96% 90% 99%  Weight: 73.1 kg   73.7 kg  Height:        Intake/Output Summary (Last 24 hours) at 07/09/2022 0835 Last data filed at 07/09/2022 0346 Gross per 24 hour  Intake 960 ml  Output 1551 ml  Net -591 ml   Filed Weights   07/07/22 0500 07/07/22 2300 07/09/22 0421  Weight: 72.4 kg 73.1 kg 73.7 kg     Data Reviewed:   CBC: Recent Labs  Lab 07/06/22 1211 07/07/22 0349 07/08/22 0359 07/09/22 0433  WBC 6.9 8.1 9.1 7.2  NEUTROABS 5.2  --   --   --   HGB 9.5* 9.0* 8.6* 8.9*  HCT 29.7* 27.6* 25.8* 27.5*  MCV 78.2* 76.2* 76.1* 76.8*  PLT 152 138* 131* 123XX123*   Basic Metabolic Panel: Recent Labs  Lab 07/06/22 1211 07/06/22 1754 07/07/22 0349 07/08/22 0359 07/09/22 0433  NA 136  --  137 134* 135  K 4.5  --  4.4 4.3 4.7  CL 105  --  104 103 101  CO2 25  --  24 23 24  $ GLUCOSE 194*  --  115* 89 118*  BUN 27*  --  27* 33* 33*  CREATININE 1.79*  --  1.92* 2.05* 2.03*  CALCIUM 7.6*  --  7.9* 7.7* 7.9*  MG  --  2.3  --  2.3 2.4   GFR: Estimated Creatinine Clearance: 22 mL/min (A) (by C-G formula based on SCr of 2.03 mg/dL (H)). Liver Function Tests: Recent Labs  Lab 07/06/22 1211  AST 18  ALT 8  ALKPHOS 55  BILITOT 0.4  PROT 6.2*  ALBUMIN  3.0*   No results for input(s): "LIPASE", "AMYLASE" in the last 168 hours. No results for input(s): "AMMONIA" in the last 168 hours. Coagulation Profile: Recent Labs  Lab 07/06/22 1754  INR 1.6*   Cardiac Enzymes: No results for input(s): "CKTOTAL", "CKMB", "CKMBINDEX", "TROPONINI" in the last 168 hours. BNP (last 3 results) No results for input(s): "PROBNP" in the last 8760 hours. HbA1C: Recent Labs    07/06/22 1627  HGBA1C 9.2*   CBG: Recent Labs  Lab 07/08/22 0735 07/08/22 1200 07/08/22 1631 07/08/22 2058 07/09/22 0736  GLUCAP 96 118* 136* 86 141*   Lipid Profile: No results for input(s): "CHOL", "HDL", "LDLCALC", "TRIG", "CHOLHDL", "LDLDIRECT" in the last 72 hours. Thyroid Function Tests: Recent Labs    07/06/22 1630  TSH 4.549*   Anemia Panel: No results for input(s): "VITAMINB12", "FOLATE", "FERRITIN", "TIBC", "IRON", "RETICCTPCT" in the last 72 hours. Sepsis Labs: Recent Labs  Lab 07/06/22 1627 07/06/22 2115 07/07/22 0349  LATICACIDVEN 1.3 3.3* 1.4    Recent Results (from the past 240 hour(s))  Resp panel by RT-PCR (RSV, Flu A&B,  Covid) Anterior Nasal Swab     Status: None   Collection Time: 07/06/22 12:12 PM   Specimen: Anterior Nasal Swab  Result Value Ref Range Status   SARS Coronavirus 2 by RT PCR NEGATIVE NEGATIVE Final    Comment: (NOTE) SARS-CoV-2 target nucleic acids are NOT DETECTED.  The SARS-CoV-2 RNA is generally detectable in upper respiratory specimens during the acute phase of infection. The lowest concentration of SARS-CoV-2 viral copies this assay can detect is 138 copies/mL. A negative result does not preclude SARS-Cov-2 infection and should not be used as the sole basis for treatment or other patient management decisions. A negative result may occur with  improper specimen collection/handling, submission of specimen other than nasopharyngeal swab, presence of viral mutation(s) within the areas targeted by this assay, and  inadequate number of viral copies(<138 copies/mL). A negative result must be combined with clinical observations, patient history, and epidemiological information. The expected result is Negative.  Fact Sheet for Patients:  EntrepreneurPulse.com.au  Fact Sheet for Healthcare Providers:  IncredibleEmployment.be  This test is no t yet approved or cleared by the [place] FDA and  has been authorized for detection and/or diagnosis of SARS-CoV-2 by FDA under an Emergency Use Authorization (EUA). This EUA will remain  in effect (meaning this test can be used) for the duration of the COVID-19 declaration under Section 564(b)(1) of the Act, 21 U.S.C.section 360bbb-3(b)(1), unless the authorization is terminated  or revoked sooner.       Influenza A by PCR NEGATIVE NEGATIVE Final   Influenza B by PCR NEGATIVE NEGATIVE Final    Comment: (NOTE) The Xpert Xpress SARS-CoV-2/FLU/RSV plus assay is intended as an aid in the diagnosis of influenza from Nasopharyngeal swab specimens and should not be used as a sole basis for treatment. Nasal washings and aspirates are unacceptable for Xpert Xpress SARS-CoV-2/FLU/RSV testing.  Fact Sheet for Patients: EntrepreneurPulse.com.au  Fact Sheet for Healthcare Providers: IncredibleEmployment.be  This test is not yet approved or cleared by the [place] FDA and has been authorized for detection and/or diagnosis of SARS-CoV-2 by FDA under an Emergency Use Authorization (EUA). This EUA will remain in effect (meaning this test can be used) for the duration of the COVID-19 declaration under Section 564(b)(1) of the Act, 21 U.S.C. section 360bbb-3(b)(1), unless the authorization is terminated or revoked.     Resp Syncytial Virus by PCR NEGATIVE NEGATIVE Final    Comment: (NOTE) Fact Sheet for Patients: EntrepreneurPulse.com.au  Fact Sheet for Healthcare  Providers: IncredibleEmployment.be  This test is not yet approved or cleared by the [place] FDA and has been authorized for detection and/or diagnosis of SARS-CoV-2 by FDA under an Emergency Use Authorization (EUA). This EUA will remain in effect (meaning this test can be used) for the duration of the COVID-19 declaration under Section 564(b)(1) of the Act, 21 U.S.C. section 360bbb-3(b)(1), unless the authorization is terminated or revoked.  Performed at [facility], [place] [address], [place], [place] [address]          Radiology Studies: DG Abd 1 View  Result Date: 07/08/2022 CLINICAL DATA:  [id number] Abdominal distention [id number] Constipation [id number] EXAM: ABDOMEN - 1 VIEW COMPARISON:  07/06/2022 FINDINGS: The bowel gas pattern is nonobstructive. Moderate volume of stool within the colon and rectum. Pancreatic parenchymal calcification again noted. Otherwise, no radio-opaque calculi or other significant radiographic abnormality are seen. IMPRESSION: Nonobstructive bowel gas pattern. Moderate volume of stool within the colon and rectum. Electronically Signed   By: Hart Carwin  Plundo D.O.   On: 07/08/2022 15:04   DG Chest 2 View  Result Date: 07/07/2022 CLINICAL DATA:  89 year old male history of pleural effusion. EXAM: CHEST - 2 VIEW COMPARISON:  Chest x-ray 07/06/2022. FINDINGS: Opacity at the left base which may reflect atelectasis and/or consolidation with superimposed moderate left pleural effusion, slightly decreased compared to the prior study. Previously noted right pleural effusion is decreased in size, now small, with additional areas of atelectasis and/or consolidation in the base of the right lung. No pneumothorax. No evidence of pulmonary edema. Cardiac silhouette is largely obscured. Upper mediastinal contours are within normal limits allowing for patient rotation to the left. Atherosclerotic calcifications in the thoracic aorta.  Left-sided pacemaker with lead tips projecting over the expected location of the right atrium and right ventricle. Numerous surgical clips project over the upper abdomen. IMPRESSION: 1. Slight decreased size of small right and moderate left pleural effusions with persistent bibasilar areas of atelectasis and/or consolidation (left-greater-than-right). 2. Aortic atherosclerosis. Electronically Signed   By: Vinnie Langton M.D.   On: 07/07/2022 11:01   ECHOCARDIOGRAM COMPLETE  Result Date: 07/07/2022    ECHOCARDIOGRAM REPORT   Patient Name:   CONSTANTINO ANNE [facility] Date of Exam: 07/07/2022 Medical Rec #:  [id number]        Height:       70.0 in Accession #:    [id number]       Weight:       159.7 lb Date of Birth:  01/22/1924       BSA:          1.897 m Patient Age:    62 years         BP:           100/70 mmHg Patient Gender: M                HR:           84 bpm. Exam Location:  Inpatient Procedure: 2D Echo, Cardiac Doppler, Color Doppler and Intracardiac            Opacification Agent Indications:    I31.3 Pericardial effusion (noninflammatory)  History:        Patient has prior history of Echocardiogram examinations.                 Stroke, Arrythmias:Atrial Fibrillation; Risk                 Factors:Hypertension and Diabetes.  Sonographer:    Phineas Douglas Referring Phys: [id number] [place]  1. Left ventricular ejection fraction, by estimation, is 50 to 55%. The left ventricle has low normal function. The left ventricle has no regional wall motion abnormalities. There is severe left ventricular hypertrophy. Left ventricular diastolic parameters are indeterminate.  2. Pacing wire in RA/RV . Right ventricular systolic function is normal. The right ventricular size is normal. There is normal pulmonary artery systolic pressure.  3. Left atrial size was moderately dilated.  4. IVC dilated but does reduce on inpspiration no RARV diastolic collapse Effuson appears larger than seen on TTE 12/18/21 . Moderate  pericardial effusion. The pericardial effusion is near circumferential.  5. The mitral valve is abnormal. Mild mitral valve regurgitation. No evidence of mitral stenosis.  6. The aortic valve is tricuspid. There is moderate calcification of the aortic valve. There is moderate thickening of the aortic valve. Aortic valve regurgitation is mild. Aortic valve sclerosis is present, with no evidence of aortic valve stenosis.  7. Aortic  dilatation noted. There is mild dilatation of the aortic root, measuring 40 mm.  8. The inferior vena cava is normal in size with greater than 50% respiratory variability, suggesting right atrial pressure of 3 mmHg. FINDINGS  Left Ventricle: Left ventricular ejection fraction, by estimation, is 50 to 55%. The left ventricle has low normal function. The left ventricle has no regional wall motion abnormalities. The left ventricular internal cavity size was normal in size. There is severe left ventricular hypertrophy. Left ventricular diastolic parameters are indeterminate. Right Ventricle: Pacing wire in RA/RV. The right ventricular size is normal. No increase in right ventricular wall thickness. Right ventricular systolic function is normal. There is normal pulmonary artery systolic pressure. The tricuspid regurgitant velocity is 2.57 m/s, and with an assumed right atrial pressure of 3 mmHg, the estimated right ventricular systolic pressure is [id number]. Left Atrium: Left atrial size was moderately dilated. Right Atrium: Right atrial size was normal in size. Pericardium: IVC dilated but does reduce on inpspiration no RARV diastolic collapse Effuson appears larger than seen on TTE 12/18/21. A moderately sized pericardial effusion is present. The pericardial effusion is near circumferential. Mitral Valve: The mitral valve is abnormal. There is mild thickening of the mitral valve leaflet(s). There is mild calcification of the mitral valve leaflet(s). Mild mitral annular calcification. Mild mitral  valve regurgitation. No evidence of mitral valve stenosis. Tricuspid Valve: The tricuspid valve is normal in structure. Tricuspid valve regurgitation is mild . No evidence of tricuspid stenosis. Aortic Valve: The aortic valve is tricuspid. There is moderate calcification of the aortic valve. There is moderate thickening of the aortic valve. Aortic valve regurgitation is mild. Aortic valve sclerosis is present, with no evidence of aortic valve stenosis. Pulmonic Valve: The pulmonic valve was normal in structure. Pulmonic valve regurgitation is not visualized. No evidence of pulmonic stenosis. Aorta: Aortic dilatation noted. There is mild dilatation of the aortic root, measuring 40 mm. Venous: The inferior vena cava is normal in size with greater than 50% respiratory variability, suggesting right atrial pressure of 3 mmHg. IAS/Shunts: No atrial level shunt detected by color flow Doppler.  LEFT VENTRICLE PLAX 2D LVIDd:         4.30 cm   Diastology LVIDs:         3.10 cm   LV e' medial:    5.17 cm/s LV PW:         1.50 cm   LV E/e' medial:  10.9 LV IVS:        1.60 cm   LV e' lateral:   6.66 cm/s LVOT diam:     1.90 cm   LV E/e' lateral: 8.5 LV SV:         35 LV SV Index:   19 LVOT Area:     2.84 cm  RIGHT VENTRICLE            IVC RV Basal diam:  3.80 cm    IVC diam: 2.10 cm RV S prime:     8.41 cm/s TAPSE (M-mode): 1.1 cm LEFT ATRIUM              Index        RIGHT ATRIUM           Index LA diam:        4.40 cm  2.32 cm/m   RA Area:     19.90 cm LA Vol (A2C):   100.0 ml 52.72 ml/m  RA Volume:   53.30 ml  28.10  ml/m LA Vol (A4C):   94.4 ml  49.76 ml/m LA Biplane Vol: 97.2 ml  51.24 ml/m  AORTIC VALVE LVOT Vmax:   64.00 cm/s LVOT Vmean:  40.300 cm/s LVOT VTI:    0.124 m  AORTA Ao Root diam: 4.00 cm Ao Asc diam:  3.70 cm MITRAL VALVE               TRICUSPID VALVE MV Area (PHT): 2.60 cm    TR Peak grad:   26.4 mmHg MV Decel Time: 292 msec    TR Vmax:        257.00 cm/s MV E velocity: 56.60 cm/s                             SHUNTS                            Systemic VTI:  0.12 m                            Systemic Diam: 1.90 cm Jenkins Rouge MD Electronically signed by Jenkins Rouge MD Signature Date/Time: 07/07/2022/10:10:07 AM    Final         Scheduled Meds:  bisacodyl  5 mg Oral BID   divalproex  125 mg Oral BID   docusate sodium  200 mg Oral Daily   finasteride  5 mg Oral Daily   furosemide  20 mg Intravenous Daily   insulin aspart  0-9 Units Subcutaneous TID AC & HS   latanoprost  1 drop Both Eyes QHS   melatonin  3 mg Oral QHS   metoprolol succinate  25 mg Oral Daily   pantoprazole  40 mg Oral Daily   Rivaroxaban  15 mg Oral Q supper   sodium chloride flush  3 mL Intravenous Q12H   tamsulosin  0.4 mg Oral Daily   Continuous Infusions:  sodium chloride       LOS: 2 days   Time spent= 35 mins    Norwin Aleman Arsenio Loader, MD [facility]  If 7PM-7AM, please contact night-coverage  07/09/2022, 8:35 AM

## 2022-07-09 NOTE — Procedures (Signed)
PROCEDURE SUMMARY:  Successful US guided left thoracentesis. Yielded 1.3 L of clear yellow fluid. Pt tolerated procedure well. No immediate complications.  Specimen was sent for labs. CXR ordered.  EBL < 5 mL  Ascencion Dike PA-C 07/09/2022 9:59 AM

## 2022-07-09 NOTE — Progress Notes (Signed)
Mobility Specialist - Progress Note   07/09/22 1217  Mobility  Activity Ambulated with assistance in room  Level of Assistance Contact guard assist, steadying assist  Assistive Device Front wheel walker  Distance Ambulated (ft) 24 ft  Activity Response Tolerated fair  $Mobility charge 1 Mobility   Pt in room with RN and agreeable to ambulate. RN wanting to do O2 home eval and aided in managing O2 during ambulation. Ambulated to entrance of door way and RN suggested to turn back due to desaturation. Requires safety cues during ambulation and at times physical assistance. Had x1 knee buckle during session. At EOS returned to recliner chair with chair alarm on. RN and family in room.  Ferd Hibbs Mobility Specialist

## 2022-07-09 NOTE — Progress Notes (Signed)
SATURATION QUALIFICATIONS: (This note is used to comply with regulatory documentation for home oxygen)  Patient Saturations on Room Air at Rest = 97-100%  Patient Saturations on Room Air while Ambulating = 81-84%  Patient Saturations on 2 Liters of oxygen while Ambulating = 88%  Patient Saturations on 4 Liters of oxygen while Ambulating = 90-91%  Please briefly explain why patient needs home oxygen:  Patient desated quickly on room air upon ambulating. Patient denied SOB and breathing was regular/ulabored. However, sats dropped to low 80's on RA, patient only ambulated about 24 ft and had to turn around. Patient will require supplemental oxygen w/ ambulation and strenuous activities.

## 2022-07-09 NOTE — Telephone Encounter (Signed)
Returned called gave diagnosis of diabetes noted in patients chart.

## 2022-07-09 NOTE — Plan of Care (Signed)
  Problem: Coping: Goal: Ability to adjust to condition or change in health will improve Outcome: Progressing   Problem: Fluid Volume: Goal: Ability to maintain a balanced intake and output will improve Outcome: Progressing   Problem: Health Behavior/Discharge Planning: Goal: Ability to identify and utilize available resources and services will improve Outcome: Progressing   Problem: Nutritional: Goal: Maintenance of adequate nutrition will improve Outcome: Progressing   Problem: Skin Integrity: Goal: Risk for impaired skin integrity will decrease Outcome: Progressing

## 2022-07-09 NOTE — TOC Progression Note (Signed)
Transition of Care [facility]) - Progression Note    Patient Details  Name: [name] MRN: [id number] Date of Birth: 1924/01/18  Transition of Care [facility] CM/SW [place], LCSW Phone Number: 07/09/2022, 1:31 PM  Clinical Narrative:    Spoke with pt's granddaughter who shares she has ordered hospital bed, overbed table and BSC through the [place]. Pt's granddaughter agreeable to home oxygen. O2 has been ordered through the [employer] and will be delivered to pt's room prior to discharge.    Expected Discharge Plan: Home/Self Care Barriers to Discharge: Continued Medical Work up  Expected Discharge Plan and Services In-house Referral: Clinical Social Work Discharge Planning Services: NA Post Acute Care Choice: Durable Medical Equipment [facility] bed) Living arrangements for the past 2 months: Single Family Home                                       Social Determinants of Health (SDOH) Interventions SDOH Screenings   Food Insecurity: No Food Insecurity (07/06/2022)  Housing: Low Risk  (07/06/2022)  Transportation Needs: No Transportation Needs (07/06/2022)  Utilities: Not At Risk (07/06/2022)  Depression (PHQ2-9): Low Risk  (06/13/2022)  Financial Resource Strain: Low Risk  (05/14/2022)  Physical Activity: Insufficiently Active (05/14/2022)  Stress: No Stress Concern Present (05/14/2022)  Tobacco Use: Medium Risk (07/06/2022)    Readmission Risk Interventions    07/08/2022   11:12 AM  Readmission Risk Prevention Plan  Transportation Screening Complete  Medication Review ([place]) Complete  PCP or Specialist appointment within 3-5 days of discharge Complete  HRI or [place] Complete  SW Recovery Care/Counseling Consult Complete  [place] Not Applicable

## 2022-07-10 DIAGNOSIS — L8990 Pressure ulcer of unspecified site, unspecified stage: Secondary | ICD-10-CM | POA: Insufficient documentation

## 2022-07-10 DIAGNOSIS — I5033 Acute on chronic diastolic (congestive) heart failure: Secondary | ICD-10-CM | POA: Diagnosis not present

## 2022-07-10 LAB — MAGNESIUM: Magnesium: 2.2 mg/dL (ref 1.7–2.4)

## 2022-07-10 LAB — CBC
HCT: 30.1 % — ABNORMAL LOW (ref 39.0–52.0)
Hemoglobin: 9.8 g/dL — ABNORMAL LOW (ref 13.0–17.0)
MCH: 25.1 pg — ABNORMAL LOW (ref 26.0–34.0)
MCHC: 32.6 g/dL (ref 30.0–36.0)
MCV: 77 fL — ABNORMAL LOW (ref 80.0–100.0)
Platelets: 130 10*3/uL — ABNORMAL LOW (ref 150–400)
RBC: 3.91 MIL/uL — ABNORMAL LOW (ref 4.22–5.81)
RDW: 17.3 % — ABNORMAL HIGH (ref 11.5–15.5)
WBC: 7.7 10*3/uL (ref 4.0–10.5)
nRBC: 0 % (ref 0.0–0.2)

## 2022-07-10 LAB — CYTOLOGY - NON PAP

## 2022-07-10 LAB — BASIC METABOLIC PANEL
Anion gap: 10 (ref 5–15)
BUN: 35 mg/dL — ABNORMAL HIGH (ref 8–23)
CO2: 23 mmol/L (ref 22–32)
Calcium: 7.9 mg/dL — ABNORMAL LOW (ref 8.9–10.3)
Chloride: 102 mmol/L (ref 98–111)
Creatinine, Ser: 2.13 mg/dL — ABNORMAL HIGH (ref 0.61–1.24)
GFR, Estimated: 28 mL/min — ABNORMAL LOW (ref >60)
Glucose, Bld: 118 mg/dL — ABNORMAL HIGH (ref 70–99)
Potassium: 4.4 mmol/L (ref 3.5–5.1)
Sodium: 135 mmol/L (ref 135–145)

## 2022-07-10 LAB — GLUCOSE, CAPILLARY: Glucose-Capillary: 107 mg/dL — ABNORMAL HIGH (ref 70–99)

## 2022-07-10 MED ORDER — DOCUSATE SODIUM 100 MG PO CAPS: 100.0000 mg | ORAL_CAPSULE | Freq: Every day | ORAL | 0 refills | Status: DC

## 2022-07-10 MED ORDER — TRAZODONE HCL 50 MG PO TABS: 50.0000 mg | ORAL_TABLET | Freq: Every evening | ORAL | 0 refills | Status: DC | PRN

## 2022-07-10 MED ORDER — FUROSEMIDE 20 MG PO TABS: 20.0000 mg | ORAL_TABLET | Freq: Every day | ORAL | 0 refills | Status: DC

## 2022-07-10 MED ORDER — MELATONIN 3 MG PO TBDP: 1.0000 | ORAL_TABLET | Freq: Every day | ORAL | 0 refills | Status: DC

## 2022-07-10 NOTE — TOC Transition Note (Signed)
Transition of Care [facility]) - CM/SW Discharge Note   Patient Details  Name: [name] MRN: [id number] Date of Birth: [birth year]  Transition of Care [facility]) CM/SW Contact:  Henrietta Dine, RN Phone Number: 07/10/2022, 11:07 AM   Clinical Narrative:    D/C orders received; per previous TOC note, DME  was ordered by pt's grand daughter through [place]; oxygen has been set up w/[employer]; Anderson Malta, RN confirms travel tank has been delivered to room; no TOC needs.   Final next level of care: Home/Self Care Barriers to Discharge: No Barriers Identified   Patient Goals and CMS Choice CMS Medicare.gov Compare Post Acute Care list provided to:: Patient Represenative (must comment) (Granddaughter) Choice offered to / list presented to : Adult Children  Discharge Placement                         Discharge Plan and Services Additional resources added to the After Visit Summary for   In-house Referral: Clinical Social Work Discharge Planning Services: NA Post Acute Care Choice: Durable Medical Equipment [facility] bed)                               Social Determinants of Health (SDOH) Interventions SDOH Screenings   Food Insecurity: No Food Insecurity (07/06/2022)  Housing: Low Risk  (07/06/2022)  Transportation Needs: No Transportation Needs (07/06/2022)  Utilities: Not At Risk (07/06/2022)  Depression (PHQ2-9): Low Risk  (06/13/2022)  Financial Resource Strain: Low Risk  (05/14/2022)  Physical Activity: Insufficiently Active (05/14/2022)  Stress: No Stress Concern Present (05/14/2022)  Tobacco Use: Medium Risk (07/06/2022)     Readmission Risk Interventions    07/08/2022   11:12 AM  Readmission Risk Prevention Plan  Transportation Screening Complete  Medication Review ([place]) Complete  PCP or Specialist appointment within 3-5 days of discharge Complete  HRI or [place] Complete  SW Recovery Care/Counseling Consult Complete  [place] Not Applicable

## 2022-07-10 NOTE — Discharge Summary (Signed)
Physician Discharge Summary  OHENE Craig [id number] DOB: [birth year] DOA: 07/06/2022  PCP: [name], MD  Admit date: 07/06/2022 Discharge date: 07/10/2022  Admitted From: Home Disposition: Home  Recommendations for Outpatient Follow-up:  Follow up with PCP in 1-2 weeks Please obtain BMP/CBC in one week your next doctors visit.  Would benefit from repeat two-view chest x-ray with PCP in about 1 week Would recommend outpatient consultation with hospice care.  This [facility] be [facility] by [facility] Home oxygen arranged   Discharge Condition: Stable CODE STATUS: DNR Diet recommendation: Heart healthy  Brief/Interim Summary: 89 year old with history of A-fib, CKD stage IIIb, prostate cancer, HTN, PPM, history of CVA, diastolic CHF, DM2, advanced Alzheimer's dementia presents to the hospital for ongoing shortness of breath and dyspnea on exertion, poor oral intake.  In ER COVID/flu/RSV were negative, BNP 365.  Chest x-ray showed concerns of moderate left-sided pleural effusion.  CT abdomen pelvis also showed moderate pericardial effusion and moderate left and right-sided pleural effusion.  Case was discussed by EDP with cardiology who recommended obtaining repeat echocardiogram which showed moderate pericardial effusion without tamponade etiology.  IV Lasix was started.  Thoracentesis performed on 2/20, about 1.3 L removed.  Patient did well with the procedure and overall felt better but with ambulation still had some signs of hypoxia therefore home oxygen was arranged.  Rest of the medical recommendations as stated above   Assessment & Plan:  Principal Problem:   Acute on chronic diastolic CHF (congestive heart failure) ([name]) Active Problems:   Pleural effusion   Ventral hernia   BRADYCARDIA-TACHYCARDIA SYNDROME s/p PPM   Atrial fibrillation ([name])   Type 2 diabetes mellitus without complication, without long-term current use of insulin ([name])   Stage 3b chronic kidney  disease ([name])   Essential hypertension   History of CVA (cerebrovascular accident)   Iron deficiency anemia   Benign prostatic hyperplasia with urinary obstruction   Alzheimer disease ([name])       Assessment and Plan: * Acute on chronic diastolic CHF (congestive heart failure) ([place]); Class 2 -Echocardiogram in August 2023 showed EF of 60% with mild reduction in RV function.  At that time small pericardial effusion was present but on the CT scan it has progressed to moderate size.  EDP discussed case with Dr. Stanford Breed who recommended repeat echocardiogram- stable ef 55%/  - Continue Lasix 20 mg p.o. daily - Currently on Toprol-XL   Bilateral pleural effusion and pericardial effusion; stable.  -This was seen on the CT scan and chest x-ray.  Patient currently remains on room air.  Echocardiogram confirms pericardial effusion without any tamponade etiology.  Continue IV Lasix.  Status post thoracentesis 2/20, 1.3 L removed.  Continue home daily Lasix.  Repeat chest x-ray with PCP in about 1-2 weeks.   Ventral hernia -CT scan showing possible obstruction but it is less pronounced than prior CT scan done about a year ago.  Admitting provider discussed case with general surgery who recommends conservative management as patient is not currently overtly symptomatic and okay with p.o. intake.follow-up outpatient with primary provider   Constipation -Resolved after tapwater enema   BRADYCARDIA-TACHYCARDIA SYNDROME s/p PPM PPM interrogation ordered  Telemetry    Atrial fibrillation ([name]) Rate controlled.  Continue toprol-xl and xarelto    Type 2 diabetes mellitus without complication, without long-term current use of insulin ([name]) Resume home regimen   Stage 3b chronic kidney disease ([name]) Baseline creatinine 2.0, today 2.0   Essential hypertension Well controlled Continue  toprol-xl 72m daily    History of CVA (cerebrovascular accident) Continue Xarelto.  Not on statin   Iron  deficiency anemia with microcytosis Continue home oral supplements.  Hemoglobin around baseline of 9.0   Benign prostatic hyperplasia with urinary obstruction Continue proscar and flomax daily    Alzheimer disease [place] On bedtime Seroquel   Sacral stage II ulcer-present prior to admission Discussed CODE STATUS with the granddaughter and the patient.  Patient transition to DNR/DNI In my opinion patient would benefit from outpatient referral to hospice care services given his advanced age and comorbidities.       Body mass index is 23.25 kg/m.  Pressure Injury 07/09/22 Buttocks Stage 1 -  Intact skin with non-blanchable redness of a localized area usually over a bony prominence. (Active)  07/09/22 1745  Location: Buttocks  Location Orientation:   Staging: Stage 1 -  Intact skin with non-blanchable redness of a localized area usually over a bony prominence.  Wound Description (Comments):   Present on Admission: Yes      Discharge Diagnoses:  Principal Problem:   Acute on chronic diastolic CHF (congestive heart failure) ([name]) Active Problems:   Pleural effusion   Ventral hernia   BRADYCARDIA-TACHYCARDIA SYNDROME s/p PPM   Atrial fibrillation ([name])   Type 2 diabetes mellitus without complication, without long-term current use of insulin ([name])   Stage 3b chronic kidney disease ([name])   Essential hypertension   History of CVA (cerebrovascular accident)   Iron deficiency anemia   Benign prostatic hyperplasia with urinary obstruction   Alzheimer disease ([place]   Pressure injury of skin      Consultations: Interventional radiology  Subjective: Feels well no complaints.  Granddaughter at bedside.  Like to go home today  Discharge Exam: Vitals:   07/09/22 2038 07/10/22 0500  BP: 102/64 98/67  Pulse: 85   Resp: 18   Temp: 98.4 F (36.9 C) 98.7 F (37.1 C)  SpO2: 100% 98%   Vitals:   07/09/22 0958 07/09/22 1226 07/09/22 2038 07/10/22 0500  BP: 96/66 93/60 102/64  98/67  Pulse:  82 85   Resp:  16 18   Temp:  98.1 F (36.7 C) 98.4 F (36.9 C) 98.7 F (37.1 C)  TempSrc:  Oral Oral Oral  SpO2:  97% 100% 98%  Weight:    73.5 kg  Height:        General: Pt is alert, awake, not in acute distress Cardiovascular: RRR, S1/S2 +, no rubs, no gallops Respiratory: Some bibasilar crackles Abdominal: Soft, NT, ND, bowel sounds + Extremities: no edema, no cyanosis  Discharge Instructions   Allergies as of 07/10/2022   No Known Allergies      Medication List     TAKE these medications    divalproex 125 MG capsule Commonly known as: DEPAKOTE SPRINKLE Take 1 capsule (125 mg total) by mouth 2 (two) times daily.   docusate sodium 100 MG capsule Commonly known as: COLACE Take 1 capsule (100 mg total) by mouth daily. What changed: when to take this   finasteride 5 MG tablet Commonly known as: PROSCAR TAKE 1 TABLET (5 MG TOTAL) BY MOUTH DAILY.   furosemide 20 MG tablet Commonly known as: LASIX Take 1 tablet (20 mg total) by mouth daily.   Iron (Ferrous Sulfate) 325 (65 Fe) MG Tabs Take 1 tablet every other day. What changed:  how much to take how to take this when to take this additional instructions   latanoprost 0.005 % ophthalmic solution Commonly  known as: XALATAN Place 1 drop into both eyes at bedtime.   loratadine 10 MG tablet Commonly known as: CLARITIN Take 10 mg by mouth every other day.   Melatonin 3 MG Tbdp Take 1 tablet by mouth at bedtime.   memantine 10 MG tablet Commonly known as: NAMENDA Take 10 mg by mouth 2 (two) times daily.   metoprolol succinate 25 MG 24 hr tablet Commonly known as: TOPROL-XL Take 1 tablet (25 mg total) by mouth daily.   pantoprazole 40 MG tablet Commonly known as: PROTONIX TAKE 1 TABLET(40 MG) BY MOUTH DAILY What changed:  how much to take how to take this when to take this additional instructions   pioglitazone 15 MG tablet Commonly known as: Actos Take 1 tablet (15 mg total)  by mouth daily.   polyethylene glycol powder 17 GM/SCOOP powder Commonly known as: GLYCOLAX/MIRALAX Take 17 g by mouth 2 (two) times daily as needed. What changed: reasons to take this   Rivaroxaban 15 MG Tabs tablet Commonly known as: Xarelto TAKE 1 TABLET DAILY WITH SUPPER What changed:  how much to take how to take this when to take this additional instructions   tamsulosin 0.4 MG Caps capsule Commonly known as: FLOMAX Take 1 capsule (0.4 mg total) by mouth daily.   traZODone 50 MG tablet Commonly known as: DESYREL Take 1 tablet (50 mg total) by mouth at bedtime as needed for sleep.               Durable Medical Equipment  (From admission, onward)           Start     Ordered   07/09/22 1257  For home use only DME oxygen  Once       Comments: Dx Diastolic CHF  Question Answer Comment  Length of Need Lifetime   Mode or (Route) Nasal cannula   Liters per Minute 3   Oxygen delivery system Gas      07/09/22 1256            Follow-up Information     [name], MD Follow up in 1 week(s).   Specialty: Family Medicine Contact information: [place] [address] [phone]                No Known Allergies  You were cared for by a hospitalist during your hospital stay. If you have any questions about your discharge medications or the care you received while you were in the hospital after you are discharged, you can call the unit and asked to speak with the hospitalist on call if the hospitalist that took care of you is not available. Once you are discharged, your primary care physician will handle any further medical issues. Please note that no refills for any discharge medications will be authorized once you are discharged, as it is imperative that you return to your primary care physician (or establish a relationship with a primary care physician if you do not have one) for your aftercare needs so that they can  reassess your need for medications and monitor your lab values.   Procedures/Studies: US THORACENTESIS ASP PLEURAL SPACE W/IMG GUIDE  Result Date: 07/09/2022 INDICATION: Shortness of breath, left-sided pleural effusion. Request for diagnostic and therapeutic thoracentesis. EXAM: ULTRASOUND GUIDED LEFT THORACENTESIS MEDICATIONS: 1% plain lidocaine, 5 mL COMPLICATIONS: None immediate. PROCEDURE: An ultrasound guided thoracentesis was thoroughly discussed with the patient and questions answered. The benefits, risks, alternatives and complications were also discussed. The  patient understands and wishes to proceed with the procedure. Written consent was obtained. Ultrasound was performed to localize and mark an adequate pocket of fluid in the left chest. The area was then prepped and draped in the normal sterile fashion. 1% Lidocaine was used for local anesthesia. Under ultrasound guidance a 6 Fr Safe-T-Centesis catheter was introduced. Thoracentesis was performed. The catheter was removed and a dressing applied. FINDINGS: A total of approximately 1.3 L of clear yellow fluid was removed. Samples were sent to the laboratory as requested by the clinical team. IMPRESSION: Successful ultrasound guided LEFT thoracentesis yielding 1.3 L of pleural fluid. Read by: Ascencion Dike PA-C Electronically Signed   By: Michaelle Birks M.D.   On: 07/09/2022 14:22   DG CHEST PORT 1 VIEW  Result Date: 07/09/2022 CLINICAL DATA:  Status post left-sided thoracentesis. EXAM: PORTABLE CHEST 1 VIEW COMPARISON:  Chest x-ray dated July 07, 2022. FINDINGS: Unchanged left chest wall pacemaker. Stable mild cardiomegaly. Decreased now small left pleural effusion status post thoracentesis. No pneumothorax. Improved aeration of the left lower lobe. Unchanged small right pleural effusion with right basilar atelectasis. No acute osseous abnormality. IMPRESSION: 1. Decreased now small left pleural effusion status post thoracentesis. No  pneumothorax. 2. Unchanged small right pleural effusion. Electronically Signed   By: Titus Dubin M.D.   On: 07/09/2022 10:31   DG Abd 1 View  Result Date: 07/08/2022 CLINICAL DATA:  [id number] Abdominal distention [id number] Constipation [id number] EXAM: ABDOMEN - 1 VIEW COMPARISON:  07/06/2022 FINDINGS: The bowel gas pattern is nonobstructive. Moderate volume of stool within the colon and rectum. Pancreatic parenchymal calcification again noted. Otherwise, no radio-opaque calculi or other significant radiographic abnormality are seen. IMPRESSION: Nonobstructive bowel gas pattern. Moderate volume of stool within the colon and rectum. Electronically Signed   By: Davina Poke D.O.   On: 07/08/2022 15:04   CUP PACEART REMOTE DEVICE CHECK  Result Date: 07/08/2022 Scheduled remote reviewed. Normal device function.  5 NSVT, < 20 beats Next remote 91 days. LA  DG Chest 2 View  Result Date: 07/07/2022 CLINICAL DATA:  89 year old male history of pleural effusion. EXAM: CHEST - 2 VIEW COMPARISON:  Chest x-ray 07/06/2022. FINDINGS: Opacity at the left base which may reflect atelectasis and/or consolidation with superimposed moderate left pleural effusion, slightly decreased compared to the prior study. Previously noted right pleural effusion is decreased in size, now small, with additional areas of atelectasis and/or consolidation in the base of the right lung. No pneumothorax. No evidence of pulmonary edema. Cardiac silhouette is largely obscured. Upper mediastinal contours are within normal limits allowing for patient rotation to the left. Atherosclerotic calcifications in the thoracic aorta. Left-sided pacemaker with lead tips projecting over the expected location of the right atrium and right ventricle. Numerous surgical clips project over the upper abdomen. IMPRESSION: 1. Slight decreased size of small right and moderate left pleural effusions with persistent bibasilar areas of atelectasis and/or  consolidation (left-greater-than-right). 2. Aortic atherosclerosis. Electronically Signed   By: Vinnie Langton M.D.   On: 07/07/2022 11:01   ECHOCARDIOGRAM COMPLETE  Result Date: 07/07/2022    ECHOCARDIOGRAM REPORT   Patient Name:   [name] Date of Exam: 07/07/2022 Medical Rec #:  [id number]        Height:       70.0 in Accession #:    [id number]       Weight:       159.7 lb Date of Birth:  Dec 26, 1923  BSA:          1.897 m Patient Age:    55 years         BP:           100/70 mmHg Patient Gender: M                HR:           84 bpm. Exam Location:  Inpatient Procedure: 2D Echo, Cardiac Doppler, Color Doppler and Intracardiac            Opacification Agent Indications:    I31.3 Pericardial effusion (noninflammatory)  History:        Patient has prior history of Echocardiogram examinations.                 Stroke, Arrythmias:Atrial Fibrillation; Risk                 Factors:Hypertension and Diabetes.  Sonographer:    Phineas Douglas Referring Phys: [id number] [place]  1. Left ventricular ejection fraction, by estimation, is 50 to 55%. The left ventricle has low normal function. The left ventricle has no regional wall motion abnormalities. There is severe left ventricular hypertrophy. Left ventricular diastolic parameters are indeterminate.  2. Pacing wire in RA/RV . Right ventricular systolic function is normal. The right ventricular size is normal. There is normal pulmonary artery systolic pressure.  3. Left atrial size was moderately dilated.  4. IVC dilated but does reduce on inpspiration no RARV diastolic collapse Effuson appears larger than seen on TTE 12/18/21 . Moderate pericardial effusion. The pericardial effusion is near circumferential.  5. The mitral valve is abnormal. Mild mitral valve regurgitation. No evidence of mitral stenosis.  6. The aortic valve is tricuspid. There is moderate calcification of the aortic valve. There is moderate thickening of the aortic valve.  Aortic valve regurgitation is mild. Aortic valve sclerosis is present, with no evidence of aortic valve stenosis.  7. Aortic dilatation noted. There is mild dilatation of the aortic root, measuring 40 mm.  8. The inferior vena cava is normal in size with greater than 50% respiratory variability, suggesting right atrial pressure of 3 mmHg. FINDINGS  Left Ventricle: Left ventricular ejection fraction, by estimation, is 50 to 55%. The left ventricle has low normal function. The left ventricle has no regional wall motion abnormalities. The left ventricular internal cavity size was normal in size. There is severe left ventricular hypertrophy. Left ventricular diastolic parameters are indeterminate. Right Ventricle: Pacing wire in RA/RV. The right ventricular size is normal. No increase in right ventricular wall thickness. Right ventricular systolic function is normal. There is normal pulmonary artery systolic pressure. The tricuspid regurgitant velocity is 2.57 m/s, and with an assumed right atrial pressure of 3 mmHg, the estimated right ventricular systolic pressure is [id number]. Left Atrium: Left atrial size was moderately dilated. Right Atrium: Right atrial size was normal in size. Pericardium: IVC dilated but does reduce on inpspiration no RARV diastolic collapse Effuson appears larger than seen on TTE 12/18/21. A moderately sized pericardial effusion is present. The pericardial effusion is near circumferential. Mitral Valve: The mitral valve is abnormal. There is mild thickening of the mitral valve leaflet(s). There is mild calcification of the mitral valve leaflet(s). Mild mitral annular calcification. Mild mitral valve regurgitation. No evidence of mitral valve stenosis. Tricuspid Valve: The tricuspid valve is normal in structure. Tricuspid valve regurgitation is mild . No evidence of tricuspid stenosis. Aortic Valve: The aortic valve is tricuspid.  There is moderate calcification of the aortic valve. There is  moderate thickening of the aortic valve. Aortic valve regurgitation is mild. Aortic valve sclerosis is present, with no evidence of aortic valve stenosis. Pulmonic Valve: The pulmonic valve was normal in structure. Pulmonic valve regurgitation is not visualized. No evidence of pulmonic stenosis. Aorta: Aortic dilatation noted. There is mild dilatation of the aortic root, measuring 40 mm. Venous: The inferior vena cava is normal in size with greater than 50% respiratory variability, suggesting right atrial pressure of 3 mmHg. IAS/Shunts: No atrial level shunt detected by color flow Doppler.  LEFT VENTRICLE PLAX 2D LVIDd:         4.30 cm   Diastology LVIDs:         3.10 cm   LV e' medial:    5.17 cm/s LV PW:         1.50 cm   LV E/e' medial:  10.9 LV IVS:        1.60 cm   LV e' lateral:   6.66 cm/s LVOT diam:     1.90 cm   LV E/e' lateral: 8.5 LV SV:         35 LV SV Index:   19 LVOT Area:     2.84 cm  RIGHT VENTRICLE            IVC RV Basal diam:  3.80 cm    IVC diam: 2.10 cm RV S prime:     8.41 cm/s TAPSE (M-mode): 1.1 cm LEFT ATRIUM              Index        RIGHT ATRIUM           Index LA diam:        4.40 cm  2.32 cm/m   RA Area:     19.90 cm LA Vol (A2C):   100.0 ml 52.72 ml/m  RA Volume:   53.30 ml  28.10 ml/m LA Vol (A4C):   94.4 ml  49.76 ml/m LA Biplane Vol: 97.2 ml  51.24 ml/m  AORTIC VALVE LVOT Vmax:   64.00 cm/s LVOT Vmean:  40.300 cm/s LVOT VTI:    0.124 m  AORTA Ao Root diam: 4.00 cm Ao Asc diam:  3.70 cm MITRAL VALVE               TRICUSPID VALVE MV Area (PHT): 2.60 cm    TR Peak grad:   26.4 mmHg MV Decel Time: 292 msec    TR Vmax:        257.00 cm/s MV E velocity: 56.60 cm/s                            SHUNTS                            Systemic VTI:  0.12 m                            Systemic Diam: 1.90 cm Jenkins Rouge MD Electronically signed by Jenkins Rouge MD Signature Date/Time: 07/07/2022/10:10:07 AM    Final    CT CHEST ABDOMEN PELVIS WO CONTRAST  Result Date: 07/06/2022 CLINICAL  DATA:  Abdominal pain. No bowel movement for 4 days. Also with shortness of breath and pleural effusions. History of small-bowel obstruction. EXAM: CT CHEST, ABDOMEN AND PELVIS WITHOUT  CONTRAST TECHNIQUE: Multidetector CT imaging of the chest, abdomen and pelvis was performed following the standard protocol without IV contrast. RADIATION DOSE REDUCTION: This exam was performed according to the departmental dose-optimization program which includes automated exposure control, adjustment of the mA and/or kV according to patient size and/or use of iterative reconstruction technique. COMPARISON:  07/29/2021.  Current chest radiograph. FINDINGS: CT CHEST FINDINGS Cardiovascular: Heart top-normal in size. Moderate pericardial effusion. Three-vessel coronary artery calcifications. Right ventricle and right atrial pacer leads are well positioned. Thoracic aorta normal in caliber. Mildly enlarged main pulmonary arteries, right 3 cm, left 3.2 cm. Mild aortic atherosclerosis. Mediastinum/Nodes: No neck base, mediastinal or hilar masses. No enlarged lymph nodes. Trachea unremarkable. Mild distension of the esophagus. Small hiatal hernia. Lungs/Pleura: Moderate left and small to moderate right pleural effusions. Dependent lung opacities, most evident in the left lower lobe, consistent with atelectasis. No convincing pneumonia. No pulmonary edema. Airways widely patent. 2-3 mm nodule, right upper lobe, image 58/6. Several small right lung calcified granuloma. Mild to moderate centrilobular and paraseptal emphysema. No pneumothorax. Musculoskeletal: No acute fractures. Old posterior left rib fractures. No bone lesion. No chest wall mass. CT ABDOMEN PELVIS FINDINGS Hepatobiliary: No focal liver abnormality is seen. Status post cholecystectomy. No biliary dilatation. Pancreas: Atrophic pancreas with calcifications, but no convincing mass or active inflammation. Duct dilation. Appearance similar to the prior CT. Spleen: Lobulated,  relatively small spleen. No splenic mass. Stable appearance. Adrenals/Urinary Tract: No adrenal mass. Right renal cortical thinning. Normal left renal size. Multiple low-attenuation right renal masses consistent with cysts, stable with no follow-up recommended. No stones. No hydronephrosis. Ureters normal in course and in caliber. Bladder minimally distended, otherwise unremarkable. Stomach/Bowel: Right anterior abdominal wall hernias, 2 adjacent hernias in the right mid to upper abdomen for lateral with a base of 2 cm, more medial with a base of approximately 4 cm. Third hernia right periumbilical, base proximally 2.6 cm. All 3 hernias contain bowel. The smaller right upper abdominal wall hernia contains a knuckle of the right transverse colon. The other 2 hernias contain portions of small bowel. Small bowel distal to the right paraumbilical hernia is decompressed. Small bowel within the 2 hernias and proximal to the hernia in the right mid to lower abdomen is dilated maximum approximately 3.8 cm. No small bowel wall thickening or adjacent inflammation. Small bowel anastomosis staple line noted right pelvis, stable from the prior CT. Vascular/Lymphatic: Aortic atherosclerosis. Low-attenuation mass versus a retrocrural lymph node lies to the right of the upper aorta just below the aortic hiatus, 2.5 x 2.1 cm transversely, unchanged from the prior CT. This may reflect a lymphocele. No definite enlarged lymph nodes. Reproductive: Small prostate, stable. Other: Diffuse subcutaneous edema.  No ascites. Musculoskeletal: No fracture or acute finding.  No bone lesion. IMPRESSION: 1. Moderate pericardial effusion. Moderate left and small to moderate right pleural effusions. Associated dependent atelectasis most evident in the left lower lobe. 2. No convincing pneumonia and no evidence of pulmonary edema. 3. 3 mm right upper lobe pulmonary nodule. Given the patient's age, in the presence multiple small calcified granuloma,  no follow-up recommended. 4. Emphysema. Three anterior abdominal wall hernias, which were present on the prior CT. To hernias contain small bowel with evidence of partial obstruction at the level of the right periumbilical hernia. Small bowel obstructive changes are less pronounced than on the prior CT. No evidence of bowel wall edema or inflammation. 5. No other acute abnormality within the abdomen or pelvis. 6. Nonspecific diffuse  subcutaneous soft tissue edema. 7. Other chronic findings as detailed stable from the prior CT. Electronically Signed   By: Lajean Manes M.D.   On: 07/06/2022 14:01   DG Chest Port 1 View  Result Date: 07/06/2022 CLINICAL DATA:  Short of breath. EXAM: PORTABLE CHEST 1 VIEW COMPARISON:  03/09/2022. FINDINGS: Moderate left and small right pleural effusions obscure the hemidiaphragms, part of the right and most of the left cardiac borders. Additional opacity at the lung bases is likely atelectasis. Mid to upper right and upper left lungs are clear. No pneumothorax. Cardiac silhouette is mostly obscured. 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 No convincing mediastinal or hilar masses. Skeletal structures are grossly intact. IMPRESSION: 1. Moderate left and small right pleural effusions associated with lung base opacity, the latter finding consistent with atelectasis. Consider pneumonia if there are consistent clinical findings. No evidence of pulmonary edema. 2. Pleural effusions have significantly increased when compared to the prior chest radiograph. Electronically Signed   By: Lajean Manes M.D.   On: 07/06/2022 13:04     The results of significant diagnostics from this hospitalization (including imaging, microbiology, ancillary and laboratory) are listed below for reference.     Microbiology: Recent Results (from the past 240 hour(s))  Resp panel by RT-PCR (RSV, Flu A&B, Covid) Anterior Nasal Swab     Status: None   Collection Time: 07/06/22 12:12 PM    Specimen: Anterior Nasal Swab  Result Value Ref Range Status   SARS Coronavirus 2 by RT PCR NEGATIVE NEGATIVE Final    Comment: (NOTE) SARS-CoV-2 target nucleic acids are NOT DETECTED.  The SARS-CoV-2 RNA is generally detectable in upper respiratory specimens during the acute phase of infection. The lowest concentration of SARS-CoV-2 viral copies this assay can detect is 138 copies/mL. A negative result does not preclude SARS-Cov-2 infection and should not be used as the sole basis for treatment or other patient management decisions. A negative result may occur with  improper specimen collection/handling, submission of specimen other than nasopharyngeal swab, presence of viral mutation(s) within the areas targeted by this assay, and inadequate number of viral copies(<138 copies/mL). A negative result must be combined with clinical observations, patient history, and epidemiological information. The expected result is Negative.  Fact Sheet for Patients:  EntrepreneurPulse.com.au  Fact Sheet for Healthcare Providers:  IncredibleEmployment.be  This test is no t yet approved or cleared by the [place] FDA and  has been authorized for detection and/or diagnosis of SARS-CoV-2 by FDA under an Emergency Use Authorization (EUA). This EUA will remain  in effect (meaning this test can be used) for the duration of the COVID-19 declaration under Section 564(b)(1) of the Act, 21 U.S.C.section 360bbb-3(b)(1), unless the authorization is terminated  or revoked sooner.       Influenza A by PCR NEGATIVE NEGATIVE Final   Influenza B by PCR NEGATIVE NEGATIVE Final    Comment: (NOTE) The Xpert Xpress SARS-CoV-2/FLU/RSV plus assay is intended as an aid in the diagnosis of influenza from Nasopharyngeal swab specimens and should not be used as a sole basis for treatment. Nasal washings and aspirates are unacceptable for Xpert Xpress  SARS-CoV-2/FLU/RSV testing.  Fact Sheet for Patients: EntrepreneurPulse.com.au  Fact Sheet for Healthcare Providers: IncredibleEmployment.be  This test is not yet approved or cleared by the [place] FDA and has been authorized for detection and/or diagnosis of SARS-CoV-2 by FDA under an Emergency Use Authorization (EUA). This EUA will remain in effect (meaning this test can be used)  for the duration of the COVID-19 declaration under Section 564(b)(1) of the Act, 21 U.S.C. section 360bbb-3(b)(1), unless the authorization is terminated or revoked.     Resp Syncytial Virus by PCR NEGATIVE NEGATIVE Final    Comment: (NOTE) Fact Sheet for Patients: EntrepreneurPulse.com.au  Fact Sheet for Healthcare Providers: IncredibleEmployment.be  This test is not yet approved or cleared by the [place] FDA and has been authorized for detection and/or diagnosis of SARS-CoV-2 by FDA under an Emergency Use Authorization (EUA). This EUA will remain in effect (meaning this test can be used) for the duration of the COVID-19 declaration under Section 564(b)(1) of the Act, 21 U.S.C. section 360bbb-3(b)(1), unless the authorization is terminated or revoked.  Performed at [facility], [place] [address] [place], [place] [address]   Culture, body fluid w Gram Stain-bottle     Status: None (Preliminary result)   Collection Time: 07/09/22  9:51 AM   Specimen: Peritoneal Washings  Result Value Ref Range Status   Specimen Description PERITONEAL  Final   Special Requests NONE  Final   Culture   Final    NO GROWTH < 24 HOURS Performed at [facility], [place] [address], [place], [place] [address]    Report Status PENDING  Incomplete  Gram stain     Status: None   Collection Time: 07/09/22  9:51 AM   Specimen: Peritoneal Washings  Result Value Ref Range Status   Specimen Description PERITONEAL  Final    Special Requests NONE  Final   Gram Stain   Final    NO WBC SEEN NO ORGANISMS SEEN Performed at [facility], [address], [place], [place] [address]    Report Status 07/09/2022 FINAL  Final     Labs: BNP (last 3 results) Recent Labs    12/05/21 1414 07/06/22 1230  BNP 531.7* 0000000*   Basic Metabolic Panel: Recent Labs  Lab 07/06/22 1211 07/06/22 1754 07/07/22 0349 07/08/22 0359 07/09/22 0433 07/10/22 0408  NA 136  --  137 134* 135 135  K 4.5  --  4.4 4.3 4.7 4.4  CL 105  --  104 103 101 102  CO2 25  --  24 23 24 23  $ GLUCOSE 194*  --  115* 89 118* 118*  BUN 27*  --  27* 33* 33* 35*  CREATININE 1.79*  --  1.92* 2.05* 2.03* 2.13*  CALCIUM 7.6*  --  7.9* 7.7* 7.9* 7.9*  MG  --  2.3  --  2.3 2.4 2.2   Liver Function Tests: Recent Labs  Lab 07/06/22 1211  AST 18  ALT 8  ALKPHOS 55  BILITOT 0.4  PROT 6.2*  ALBUMIN 3.0*   No results for input(s): "LIPASE", "AMYLASE" in the last 168 hours. No results for input(s): "AMMONIA" in the last 168 hours. CBC: Recent Labs  Lab 07/06/22 1211 07/07/22 0349 07/08/22 0359 07/09/22 0433 07/10/22 0408  WBC 6.9 8.1 9.1 7.2 7.7  NEUTROABS 5.2  --   --   --   --   HGB 9.5* 9.0* 8.6* 8.9* 9.8*  HCT 29.7* 27.6* 25.8* 27.5* 30.1*  MCV 78.2* 76.2* 76.1* 76.8* 77.0*  PLT 152 138* 131* 121* 130*   Cardiac Enzymes: No results for input(s): "CKTOTAL", "CKMB", "CKMBINDEX", "TROPONINI" in the last 168 hours. BNP: Invalid input(s): "POCBNP" CBG: Recent Labs  Lab 07/09/22 0736 07/09/22 1156 07/09/22 1704 07/09/22 2035 07/10/22 0740  GLUCAP 141* 87 74 187* 107*   D-Dimer No results  for input(s): "DDIMER" in the last 72 hours. Hgb A1c No results for input(s): "HGBA1C" in the last 72 hours. Lipid Profile No results for input(s): "CHOL", "HDL", "LDLCALC", "TRIG", "CHOLHDL", "LDLDIRECT" in the last 72 hours. Thyroid function studies No results for input(s): "TSH", "T4TOTAL", "T3FREE", "THYROIDAB" in the last 72  hours.  Invalid input(s): "FREET3" Anemia work up No results for input(s): "VITAMINB12", "FOLATE", "FERRITIN", "TIBC", "IRON", "RETICCTPCT" in the last 72 hours. Urinalysis    Component Value Date/Time   COLORURINE YELLOW 02/10/2022 2134   APPEARANCEUR CLEAR 02/10/2022 2134   LABSPEC 1.017 02/10/2022 2134   PHURINE 5.5 02/10/2022 2134   GLUCOSEU >1,000 (A) 02/10/2022 2134   GLUCOSEU NEGATIVE 10/30/2020 1051   HGBUR NEGATIVE 02/10/2022 2134   BILIRUBINUR NEGATIVE 02/10/2022 2134   BILIRUBINUR neg 08/03/2021 1702   KETONESUR NEGATIVE 02/10/2022 2134   PROTEINUR NEGATIVE 02/10/2022 2134   UROBILINOGEN 1.0 08/03/2021 1702   UROBILINOGEN 0.2 10/30/2020 1051   NITRITE NEGATIVE 02/10/2022 2134   LEUKOCYTESUR NEGATIVE 02/10/2022 2134   Sepsis Labs Recent Labs  Lab 07/07/22 0349 07/08/22 0359 07/09/22 0433 07/10/22 0408  WBC 8.1 9.1 7.2 7.7   Microbiology Recent Results (from the past 240 hour(s))  Resp panel by RT-PCR (RSV, Flu A&B, Covid) Anterior Nasal Swab     Status: None   Collection Time: 07/06/22 12:12 PM   Specimen: Anterior Nasal Swab  Result Value Ref Range Status   SARS Coronavirus 2 by RT PCR NEGATIVE NEGATIVE Final    Comment: (NOTE) SARS-CoV-2 target nucleic acids are NOT DETECTED.  The SARS-CoV-2 RNA is generally detectable in upper respiratory specimens during the acute phase of infection. The lowest concentration of SARS-CoV-2 viral copies this assay can detect is 138 copies/mL. A negative result does not preclude SARS-Cov-2 infection and should not be used as the sole basis for treatment or other patient management decisions. A negative result may occur with  improper specimen collection/handling, submission of specimen other than nasopharyngeal swab, presence of viral mutation(s) within the areas targeted by this assay, and inadequate number of viral copies(<138 copies/mL). A negative result must be combined with clinical observations, patient history,  and epidemiological information. The expected result is Negative.  Fact Sheet for Patients:  EntrepreneurPulse.com.au  Fact Sheet for Healthcare Providers:  IncredibleEmployment.be  This test is no t yet approved or cleared by the [place] FDA and  has been authorized for detection and/or diagnosis of SARS-CoV-2 by FDA under an Emergency Use Authorization (EUA). This EUA will remain  in effect (meaning this test can be used) for the duration of the COVID-19 declaration under Section 564(b)(1) of the Act, 21 U.S.C.section 360bbb-3(b)(1), unless the authorization is terminated  or revoked sooner.       Influenza A by PCR NEGATIVE NEGATIVE Final   Influenza B by PCR NEGATIVE NEGATIVE Final    Comment: (NOTE) The Xpert Xpress SARS-CoV-2/FLU/RSV plus assay is intended as an aid in the diagnosis of influenza from Nasopharyngeal swab specimens and should not be used as a sole basis for treatment. Nasal washings and aspirates are unacceptable for Xpert Xpress SARS-CoV-2/FLU/RSV testing.  Fact Sheet for Patients: EntrepreneurPulse.com.au  Fact Sheet for Healthcare Providers: IncredibleEmployment.be  This test is not yet approved or cleared by the [place] FDA and has been authorized for detection and/or diagnosis of SARS-CoV-2 by FDA under an Emergency Use Authorization (EUA). This EUA will remain in effect (meaning this test can be used) for the duration of the COVID-19 declaration under Section 564(b)(1) of  the Act, 21 U.S.C. section 360bbb-3(b)(1), unless the authorization is terminated or revoked.     Resp Syncytial Virus by PCR NEGATIVE NEGATIVE Final    Comment: (NOTE) Fact Sheet for Patients: EntrepreneurPulse.com.au  Fact Sheet for Healthcare Providers: IncredibleEmployment.be  This test is not yet approved or cleared by the [place] FDA and has  been authorized for detection and/or diagnosis of SARS-CoV-2 by FDA under an Emergency Use Authorization (EUA). This EUA will remain in effect (meaning this test can be used) for the duration of the COVID-19 declaration under Section 564(b)(1) of the Act, 21 U.S.C. section 360bbb-3(b)(1), unless the authorization is terminated or revoked.  Performed at [facility], [place] [address], [place], [place] [address]   Culture, body fluid w Gram Stain-bottle     Status: None (Preliminary result)   Collection Time: 07/09/22  9:51 AM   Specimen: Peritoneal Washings  Result Value Ref Range Status   Specimen Description PERITONEAL  Final   Special Requests NONE  Final   Culture   Final    NO GROWTH < 24 HOURS Performed at [facility], [place] [address], [place], [place] [address]    Report Status PENDING  Incomplete  Gram stain     Status: None   Collection Time: 07/09/22  9:51 AM   Specimen: Peritoneal Washings  Result Value Ref Range Status   Specimen Description PERITONEAL  Final   Special Requests NONE  Final   Gram Stain   Final    NO WBC SEEN NO ORGANISMS SEEN Performed at [facility], [address] [place], [place] [address]    Report Status 07/09/2022 FINAL  Final     Time coordinating discharge:  I have spent 35 minutes face to face with the patient and on the ward discussing the patients care, assessment, plan and disposition with other care givers. >50% of the time was devoted counseling the patient about the risks and benefits of treatment/Discharge disposition and coordinating care.   SIGNED:   Damita Lack, MD  [facility] 07/10/2022, 12:14 PM   If 7PM-7AM, please contact night-coverage

## 2022-07-10 NOTE — Plan of Care (Signed)
  Problem: Coping: Goal: Ability to adjust to condition or change in health will improve Outcome: Progressing   Problem: Fluid Volume: Goal: Ability to maintain a balanced intake and output will improve Outcome: Progressing   Problem: Skin Integrity: Goal: Risk for impaired skin integrity will decrease Outcome: Progressing   Problem: Tissue Perfusion: Goal: Adequacy of tissue perfusion will improve Outcome: Progressing

## 2022-07-10 NOTE — Progress Notes (Signed)
AVS and discharge instructions reviewed w/ patient and granddaughter. Both verbalized understanding and had no further questions.

## 2022-07-11 ENCOUNTER — Telehealth: Payer: Self-pay

## 2022-07-11 NOTE — Transitions of Care (Post Inpatient/ED Visit) (Signed)
   07/11/2022  Name: [name] MRN: [id number] DOB: 30-Jul-1923  Today's TOC FU Call Status: Today's TOC FU Call Status:: Unsuccessul Call (1st Attempt) Unsuccessful Call (1st Attempt) Date: 07/11/22  Attempted to reach the patient regarding the most recent Inpatient/ED visit.  Follow Up Plan: Additional outreach attempts will be made to reach the patient to complete the Transitions of Care (Post Inpatient/ED visit) call.   [place] LPN [place] Advisor Direct Dial [phone]

## 2022-07-14 LAB — CULTURE, BODY FLUID W GRAM STAIN -BOTTLE: Culture: NO GROWTH

## 2022-07-15 ENCOUNTER — Telehealth: Payer: Self-pay | Admitting: Family Medicine

## 2022-07-15 NOTE — Transitions of Care (Post Inpatient/ED Visit) (Signed)
   07/15/2022  Name: MAXIMOS KRALICEK MRN: [id number] DOB: 05/24/1923  Today's TOC FU Call Status: Today's TOC FU Call Status:: Successful TOC FU Call Competed Unsuccessful Call (1st Attempt) Date: 07/11/22 [facility] FU Call Complete Date: 07/15/22  Transition Care Management Follow-up Telephone Call Date of Discharge: 07/10/22 Discharge Facility: [facility]) Type of Discharge: Inpatient Admission Primary Inpatient Discharge Diagnosis:: Acute on chronic diastolic CHF How have you been since you were released from the hospital?: Better Any questions or concerns?: No  Items Reviewed: Did you receive and understand the discharge instructions provided?: Yes Medications obtained and verified?: Yes (Medications Reviewed) Any new allergies since your discharge?: No Dietary orders reviewed?: Yes Do you have support at home?: Yes  Home Care and Equipment/Supplies: [place] Ordered?: Yes Name of [place]:: [place] set up a time to come to your home?: Yes [place] Visit Date: 07/14/22 Any new equipment or medical supplies ordered?: Yes Name of Medical supply agency?: oxygen- [place] Were you able to get the equipment/medical supplies?: Yes Do you have any questions related to the use of the equipment/supplies?: No  Functional Questionnaire: Do you need assistance with bathing/showering or dressing?: Yes Do you need assistance with meal preparation?: No Do you need assistance with eating?: No Do you have difficulty maintaining continence: No Do you need assistance with getting out of bed/getting out of a chair/moving?: Yes Do you have difficulty managing or taking your medications?: Yes  Folllow up appointments reviewed: PCP Follow-up appointment confirmed?: No (daughter will make fu appt to [facility] for fu due to cost) MD Provider Line Number:[phone] Given: Yes [name] Follow-up appointment confirmed?: No (pt is  calling to make a fu with [facility] for fu appt) Reason Specialist Follow-Up Not Confirmed: Patient has Specialist Provider Number and will Call for Appointment Do you need transportation to your follow-up appointment?: No Do you understand care options if your condition(s) worsen?: Yes-patient verbalized understanding    [place] LPN [place] Direct Dial [phone]

## 2022-07-15 NOTE — Telephone Encounter (Signed)
Hospital follow up appointment scheduled

## 2022-07-15 NOTE — Telephone Encounter (Signed)
Caller Name: Nigel Bridgeman /daughter Call back phone #: [phone]  Reason for Call: His daughter stated the oxygen tank that [facility] gave them is too heavy for her to carry. She would like an oxygen concentrator and it needs a Dr order.

## 2022-07-15 NOTE — Telephone Encounter (Signed)
Returned patients granddaughters call no answer LMTCB patient needing to schedule hospital follow up appointment and to discuss O2 tank.

## 2022-07-16 NOTE — Telephone Encounter (Signed)
Caller Name: Jenny Reichmann w/[employer] Call back phone #: [phone]  Reason for Call: Jenny Reichmann called and walked me through printing form from website for [place] that pt is eligible for thru his insurance. She has also spoken with Izell Corder, pts caregiver. Form in providers folder in front office, may need to complete at 2/29 hosp f/u visit.

## 2022-07-16 NOTE — Telephone Encounter (Signed)
Forms received and place on Providers desk to be filled out at patient upcoming visit.

## 2022-07-18 ENCOUNTER — Encounter: Payer: Self-pay | Admitting: Family Medicine

## 2022-07-18 ENCOUNTER — Ambulatory Visit (INDEPENDENT_AMBULATORY_CARE_PROVIDER_SITE_OTHER): Payer: PPO | Admitting: Family Medicine

## 2022-07-18 VITALS — BP 98/62 | HR 102 | Temp 98.3°F | Ht 70.0 in | Wt 161.0 lb

## 2022-07-18 DIAGNOSIS — Z09 Encounter for follow-up examination after completed treatment for conditions other than malignant neoplasm: Secondary | ICD-10-CM

## 2022-07-18 DIAGNOSIS — F03911 Unspecified dementia, unspecified severity, with agitation: Secondary | ICD-10-CM

## 2022-07-18 NOTE — Progress Notes (Signed)
Established Patient Office Visit   Subjective:  Patient ID: [name], male    DOB: 1923/07/27  Age: 89 y.o. MRN: [id number]  Chief Complaint  Patient presents with   Hospitalization [facility] follow up, not sure if patient has to sleep with oxygen on, would like portable tank for traveling.     HPI Encounter Diagnoses  Name Primary?   Agitation due to dementia [facility]) Yes   Hospital discharge follow-up    For follow-up of recent hospitalization for hypoxia with a pleural effusion and a pericardial effusion.  Pleural effusion was treated with thoracentesis.  Pericardial effusion was treated with IV Lasix.  He was sent home on 4 L of continuous oxygen.  He comes in today with his son-in-law.  Oxygen tank is empty and he is satting at 98%.  Otherwise continues home oxygen.  Hospitalist had recommended consideration for hospice care.  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   Review of Systems  Constitutional: Negative.   HENT:  Positive for hearing loss.   Eyes:  Negative for blurred vision, discharge and redness.  Respiratory: Negative.  Negative for shortness of breath.   Cardiovascular: Negative.   Gastrointestinal:  Negative for abdominal pain.  Genitourinary: Negative.   Musculoskeletal: Negative.  Negative for myalgias.  Skin:  Negative for rash.  Neurological:  Negative for tingling and loss of consciousness.  Endo/Heme/Allergies:  Negative for polydipsia.     Current Outpatient Medications:    docusate sodium (COLACE) 100 MG capsule, Take 1 capsule (100 mg total) by mouth daily., Disp: 30 capsule, Rfl: 0   finasteride (PROSCAR) 5 MG tablet, TAKE 1 TABLET (5 MG TOTAL) BY MOUTH DAILY., Disp: 90 tablet, Rfl: 1   furosemide (LASIX) 20 MG tablet, Take 1 tablet (20 mg total) by mouth daily., Disp: 30 tablet, Rfl: 0   Iron, Ferrous Sulfate, 325 (65 Fe) MG TABS, Take 1 tablet every other day. (Patient taking  differently: Take 1 tablet by mouth every other day.), Disp: 45 tablet, Rfl: 1   latanoprost (XALATAN) 0.005 % ophthalmic solution, Place 1 drop into both eyes at bedtime., Disp: , Rfl:    loratadine (CLARITIN) 10 MG tablet, Take 10 mg by mouth every other day., Disp: , Rfl:    Melatonin 3 MG TBDP, Take 1 tablet by mouth at bedtime., Disp: 30 tablet, Rfl: 0   memantine (NAMENDA) 10 MG tablet, Take 10 mg by mouth 2 (two) times daily., Disp: , Rfl:    metoprolol succinate (TOPROL-XL) 25 MG 24 hr tablet, Take 1 tablet (25 mg total) by mouth daily., Disp: 30 tablet, Rfl: 0   pantoprazole (PROTONIX) 40 MG tablet, TAKE 1 TABLET(40 MG) BY MOUTH DAILY (Patient taking differently: Take 40 mg by mouth daily.), Disp: 90 tablet, Rfl: 1   pioglitazone (ACTOS) 15 MG tablet, Take 1 tablet (15 mg total) by mouth daily., Disp: 30 tablet, Rfl: 5   polyethylene glycol powder (GLYCOLAX/MIRALAX) 17 GM/SCOOP powder, Take 17 g by mouth 2 (two) times daily as needed. (Patient taking differently: Take 17 g by mouth 2 (two) times daily as needed for mild constipation.), Disp: 3350 g, Rfl: 1   Rivaroxaban (XARELTO) 15 MG TABS tablet, TAKE 1 TABLET DAILY WITH SUPPER (Patient taking differently: Take 15 mg by mouth daily with supper.), Disp: 90 tablet, Rfl: 1   tamsulosin (FLOMAX) 0.4 MG CAPS capsule, Take 1 capsule (0.4 mg total) by mouth daily., Disp: 90  capsule, Rfl: 3   traZODone (DESYREL) 50 MG tablet, Take 1 tablet (50 mg total) by mouth at bedtime as needed for sleep., Disp: 30 tablet, Rfl: 0   divalproex (DEPAKOTE SPRINKLE) 125 MG capsule, Take 1 capsule (125 mg total) by mouth 2 (two) times daily., Disp: 60 capsule, Rfl: 0   Objective:     BP 98/62 (BP Location: Left Arm, Patient Position: Sitting, Cuff Size: Normal)   Pulse (!) 102   Temp 98.3 F (36.8 C) (Temporal)   Ht '5\' 10"'$  (1.778 m)   Wt 161 lb (73 kg)   SpO2 98%   BMI 23.10 kg/m    Physical Exam Constitutional:      General: He is not in acute  distress.    Appearance: Normal appearance. He is not ill-appearing, toxic-appearing or diaphoretic.  HENT:     Head: Normocephalic and atraumatic.     Right Ear: External ear normal.     Left Ear: External ear normal.  Eyes:     General: No scleral icterus.       Right eye: No discharge.        Left eye: No discharge.     Extraocular Movements: Extraocular movements intact.     Conjunctiva/sclera: Conjunctivae normal.  Pulmonary:     Effort: Pulmonary effort is normal. No respiratory distress.  Skin:    General: Skin is warm and dry.  Neurological:     Mental Status: He is alert. Mental status is at baseline.  Psychiatric:        Mood and Affect: Mood normal.        Behavior: Behavior normal.      No results found for any visits on 07/18/22.    The ASCVD Risk score (Arnett DK, et al., 2019) failed to calculate for the following reasons:   The 2019 ASCVD risk score is only valid for ages 30 to 76   The patient has a prior MI or stroke diagnosis    Assessment & Plan:   Agitation due to dementia ([place]) -     AMB Referral to [facility] discharge follow-up  -Chronic care management to arrange palliative care.  Return in about 6 weeks (around 08/29/2022), or if symptoms worsen or fail to improve.    [name], MD

## 2022-07-22 ENCOUNTER — Inpatient Hospital Stay (HOSPITAL_COMMUNITY)
Admission: EM | Admit: 2022-07-22 | Discharge: 2022-07-29 | DRG: 291 | Disposition: A | Discharge status: Skilled Nursing Facility | Payer: No Typology Code available for payment source | Attending: Family Medicine | Admitting: Family Medicine

## 2022-07-22 ENCOUNTER — Observation Stay (HOSPITAL_COMMUNITY): Payer: No Typology Code available for payment source

## 2022-07-22 ENCOUNTER — Encounter (HOSPITAL_COMMUNITY): Payer: Self-pay

## 2022-07-22 ENCOUNTER — Other Ambulatory Visit: Payer: Self-pay

## 2022-07-22 ENCOUNTER — Emergency Department (HOSPITAL_COMMUNITY): Payer: No Typology Code available for payment source

## 2022-07-22 DIAGNOSIS — Z8673 Personal history of transient ischemic attack (TIA), and cerebral infarction without residual deficits: Secondary | ICD-10-CM

## 2022-07-22 DIAGNOSIS — J90 Pleural effusion, not elsewhere classified: Secondary | ICD-10-CM | POA: Diagnosis present

## 2022-07-22 DIAGNOSIS — E8809 Other disorders of plasma-protein metabolism, not elsewhere classified: Secondary | ICD-10-CM | POA: Diagnosis not present

## 2022-07-22 DIAGNOSIS — J9621 Acute and chronic respiratory failure with hypoxia: Secondary | ICD-10-CM | POA: Diagnosis present

## 2022-07-22 DIAGNOSIS — Z8546 Personal history of malignant neoplasm of prostate: Secondary | ICD-10-CM

## 2022-07-22 DIAGNOSIS — J9611 Chronic respiratory failure with hypoxia: Secondary | ICD-10-CM

## 2022-07-22 DIAGNOSIS — I4891 Unspecified atrial fibrillation: Secondary | ICD-10-CM | POA: Diagnosis not present

## 2022-07-22 DIAGNOSIS — I10 Essential (primary) hypertension: Secondary | ICD-10-CM | POA: Diagnosis present

## 2022-07-22 DIAGNOSIS — Z823 Family history of stroke: Secondary | ICD-10-CM

## 2022-07-22 DIAGNOSIS — Z79899 Other long term (current) drug therapy: Secondary | ICD-10-CM

## 2022-07-22 DIAGNOSIS — R0902 Hypoxemia: Secondary | ICD-10-CM

## 2022-07-22 DIAGNOSIS — R0689 Other abnormalities of breathing: Secondary | ICD-10-CM | POA: Diagnosis not present

## 2022-07-22 DIAGNOSIS — R0600 Dyspnea, unspecified: Secondary | ICD-10-CM | POA: Diagnosis not present

## 2022-07-22 DIAGNOSIS — Z950 Presence of cardiac pacemaker: Secondary | ICD-10-CM

## 2022-07-22 DIAGNOSIS — I959 Hypotension, unspecified: Secondary | ICD-10-CM | POA: Diagnosis not present

## 2022-07-22 DIAGNOSIS — Z794 Long term (current) use of insulin: Secondary | ICD-10-CM

## 2022-07-22 DIAGNOSIS — J918 Pleural effusion in other conditions classified elsewhere: Secondary | ICD-10-CM | POA: Diagnosis present

## 2022-07-22 DIAGNOSIS — R739 Hyperglycemia, unspecified: Secondary | ICD-10-CM | POA: Diagnosis not present

## 2022-07-22 DIAGNOSIS — F0280 Dementia in other diseases classified elsewhere without behavioral disturbance: Secondary | ICD-10-CM | POA: Diagnosis present

## 2022-07-22 DIAGNOSIS — G309 Alzheimer's disease, unspecified: Secondary | ICD-10-CM | POA: Diagnosis not present

## 2022-07-22 DIAGNOSIS — I5032 Chronic diastolic (congestive) heart failure: Secondary | ICD-10-CM | POA: Diagnosis present

## 2022-07-22 DIAGNOSIS — Z66 Do not resuscitate: Secondary | ICD-10-CM | POA: Diagnosis present

## 2022-07-22 DIAGNOSIS — I3139 Other pericardial effusion (noninflammatory): Secondary | ICD-10-CM | POA: Diagnosis present

## 2022-07-22 DIAGNOSIS — E119 Type 2 diabetes mellitus without complications: Secondary | ICD-10-CM

## 2022-07-22 DIAGNOSIS — Z91119 Patient's noncompliance with dietary regimen due to unspecified reason: Secondary | ICD-10-CM

## 2022-07-22 DIAGNOSIS — Z87891 Personal history of nicotine dependence: Secondary | ICD-10-CM

## 2022-07-22 DIAGNOSIS — I130 Hypertensive heart and chronic kidney disease with heart failure and stage 1 through stage 4 chronic kidney disease, or unspecified chronic kidney disease: Principal | ICD-10-CM | POA: Diagnosis present

## 2022-07-22 DIAGNOSIS — N179 Acute kidney failure, unspecified: Secondary | ICD-10-CM | POA: Diagnosis present

## 2022-07-22 DIAGNOSIS — I5033 Acute on chronic diastolic (congestive) heart failure: Secondary | ICD-10-CM | POA: Diagnosis present

## 2022-07-22 DIAGNOSIS — E1122 Type 2 diabetes mellitus with diabetic chronic kidney disease: Secondary | ICD-10-CM | POA: Diagnosis present

## 2022-07-22 DIAGNOSIS — Z9981 Dependence on supplemental oxygen: Secondary | ICD-10-CM

## 2022-07-22 DIAGNOSIS — Z1152 Encounter for screening for COVID-19: Secondary | ICD-10-CM

## 2022-07-22 DIAGNOSIS — Z7901 Long term (current) use of anticoagulants: Secondary | ICD-10-CM

## 2022-07-22 DIAGNOSIS — N1832 Chronic kidney disease, stage 3b: Secondary | ICD-10-CM | POA: Diagnosis present

## 2022-07-22 DIAGNOSIS — D649 Anemia, unspecified: Secondary | ICD-10-CM | POA: Diagnosis present

## 2022-07-22 HISTORY — DX: Cerebral infarction, unspecified: I63.9

## 2022-07-22 LAB — COMPREHENSIVE METABOLIC PANEL
ALT: 8 U/L (ref 0–44)
AST: 17 U/L (ref 15–41)
Albumin: 2.4 g/dL — ABNORMAL LOW (ref 3.5–5.0)
Alkaline Phosphatase: 47 U/L (ref 38–126)
Anion gap: 10 (ref 5–15)
BUN: 32 mg/dL — ABNORMAL HIGH (ref 8–23)
CO2: 22 mmol/L (ref 22–32)
Calcium: 7.4 mg/dL — ABNORMAL LOW (ref 8.9–10.3)
Chloride: 105 mmol/L (ref 98–111)
Creatinine, Ser: 2.48 mg/dL — ABNORMAL HIGH (ref 0.61–1.24)
GFR, Estimated: 23 mL/min — ABNORMAL LOW (ref >60)
Glucose, Bld: 158 mg/dL — ABNORMAL HIGH (ref 70–99)
Potassium: 4.6 mmol/L (ref 3.5–5.1)
Sodium: 137 mmol/L (ref 135–145)
Total Bilirubin: 0.3 mg/dL (ref 0.3–1.2)
Total Protein: 5.2 g/dL — ABNORMAL LOW (ref 6.5–8.1)

## 2022-07-22 LAB — CBC WITH DIFFERENTIAL/PLATELET
Abs Immature Granulocytes: 0.04 10*3/uL (ref 0.00–0.07)
Basophils Absolute: 0 10*3/uL (ref 0.0–0.1)
Basophils Relative: 0 %
Eosinophils Absolute: 0.1 10*3/uL (ref 0.0–0.5)
Eosinophils Relative: 1 %
HCT: 27.4 % — ABNORMAL LOW (ref 39.0–52.0)
Hemoglobin: 9 g/dL — ABNORMAL LOW (ref 13.0–17.0)
Immature Granulocytes: 1 %
Lymphocytes Relative: 17 %
Lymphs Abs: 1.5 10*3/uL (ref 0.7–4.0)
MCH: 25.6 pg — ABNORMAL LOW (ref 26.0–34.0)
MCHC: 32.8 g/dL (ref 30.0–36.0)
MCV: 77.8 fL — ABNORMAL LOW (ref 80.0–100.0)
Monocytes Absolute: 0.9 10*3/uL (ref 0.1–1.0)
Monocytes Relative: 10 %
Neutro Abs: 6.2 10*3/uL (ref 1.7–7.7)
Neutrophils Relative %: 71 %
Platelets: 182 10*3/uL (ref 150–400)
RBC: 3.52 MIL/uL — ABNORMAL LOW (ref 4.22–5.81)
RDW: 17.7 % — ABNORMAL HIGH (ref 11.5–15.5)
WBC: 8.6 10*3/uL (ref 4.0–10.5)
nRBC: 0 % (ref 0.0–0.2)

## 2022-07-22 LAB — TROPONIN I (HIGH SENSITIVITY)
Troponin I (High Sensitivity): 21 ng/L — ABNORMAL HIGH (ref <18)
Troponin I (High Sensitivity): 23 ng/L — ABNORMAL HIGH (ref <18)

## 2022-07-22 LAB — BRAIN NATRIURETIC PEPTIDE: B Natriuretic Peptide: 326.5 pg/mL — ABNORMAL HIGH (ref 0.0–100.0)

## 2022-07-22 LAB — URINALYSIS, ROUTINE W REFLEX MICROSCOPIC
Bilirubin Urine: NEGATIVE
Glucose, UA: NEGATIVE mg/dL
Hgb urine dipstick: NEGATIVE
Ketones, ur: NEGATIVE mg/dL
Leukocytes,Ua: NEGATIVE
Nitrite: NEGATIVE
Protein, ur: NEGATIVE mg/dL
Specific Gravity, Urine: 1.011 (ref 1.005–1.030)
pH: 5 (ref 5.0–8.0)

## 2022-07-22 LAB — RESP PANEL BY RT-PCR (RSV, FLU A&B, COVID)  RVPGX2
Influenza A by PCR: NEGATIVE
Influenza B by PCR: NEGATIVE
Resp Syncytial Virus by PCR: NEGATIVE
SARS Coronavirus 2 by RT PCR: NEGATIVE

## 2022-07-22 LAB — LACTIC ACID, PLASMA
Lactic Acid, Venous: 2.1 mmol/L (ref 0.5–1.9)
Lactic Acid, Venous: 2.1 mmol/L (ref 0.5–1.9)

## 2022-07-22 LAB — GLUCOSE, CAPILLARY: Glucose-Capillary: 147 mg/dL — ABNORMAL HIGH (ref 70–99)

## 2022-07-22 MED ORDER — PANTOPRAZOLE SODIUM 40 MG PO TBEC
40.0000 mg | DELAYED_RELEASE_TABLET | Freq: Every day | ORAL | Status: DC
  Administered 2022-07-23 – 2022-07-29 (×7): 40 mg via ORAL
  Filled 2022-07-22 (×7): qty 1

## 2022-07-22 MED ORDER — ALBUMIN HUMAN 25 % IV SOLN
25.0000 g | Freq: Every day | INTRAVENOUS | Status: AC
  Administered 2022-07-23 – 2022-07-24 (×2): 25 g via INTRAVENOUS
  Filled 2022-07-22 (×2): qty 100

## 2022-07-22 MED ORDER — ONDANSETRON HCL 4 MG PO TABS: 4.0000 mg | ORAL_TABLET | Freq: Four times a day (QID) | ORAL | Status: DC | PRN

## 2022-07-22 MED ORDER — INSULIN ASPART 100 UNIT/ML IJ SOLN
0.0000 [IU] | Freq: Three times a day (TID) | INTRAMUSCULAR | Status: DC
  Administered 2022-07-23: 1 [IU] via SUBCUTANEOUS
  Administered 2022-07-23: 2 [IU] via SUBCUTANEOUS
  Administered 2022-07-24: 3 [IU] via SUBCUTANEOUS
  Administered 2022-07-24: 1 [IU] via SUBCUTANEOUS
  Administered 2022-07-25 (×2): 2 [IU] via SUBCUTANEOUS
  Administered 2022-07-26: 1 [IU] via SUBCUTANEOUS
  Administered 2022-07-26 – 2022-07-27 (×3): 2 [IU] via SUBCUTANEOUS
  Administered 2022-07-28: 3 [IU] via SUBCUTANEOUS
  Administered 2022-07-28: 1 [IU] via SUBCUTANEOUS
  Administered 2022-07-29: 3 [IU] via SUBCUTANEOUS

## 2022-07-22 MED ORDER — FINASTERIDE 5 MG PO TABS
5.0000 mg | ORAL_TABLET | Freq: Every day | ORAL | Status: DC
  Administered 2022-07-23 – 2022-07-29 (×7): 5 mg via ORAL
  Filled 2022-07-22 (×7): qty 1

## 2022-07-22 MED ORDER — METOPROLOL SUCCINATE ER 25 MG PO TB24
25.0000 mg | ORAL_TABLET | Freq: Every day | ORAL | Status: DC
  Administered 2022-07-23 – 2022-07-27 (×4): 25 mg via ORAL
  Filled 2022-07-22 (×6): qty 1

## 2022-07-22 MED ORDER — ACETAMINOPHEN 325 MG PO TABS
650.0000 mg | ORAL_TABLET | Freq: Four times a day (QID) | ORAL | Status: DC | PRN
  Administered 2022-07-23 – 2022-07-24 (×2): 650 mg via ORAL
  Filled 2022-07-22 (×2): qty 2

## 2022-07-22 MED ORDER — ACETAMINOPHEN 650 MG RE SUPP: 650.0000 mg | Freq: Four times a day (QID) | RECTAL | Status: DC | PRN

## 2022-07-22 MED ORDER — MELATONIN 3 MG PO TABS
3.0000 mg | ORAL_TABLET | Freq: Every day | ORAL | Status: DC
  Administered 2022-07-22 – 2022-07-28 (×7): 3 mg via ORAL
  Filled 2022-07-22 (×7): qty 1

## 2022-07-22 MED ORDER — INSULIN ASPART 100 UNIT/ML IJ SOLN: 0.0000 [IU] | Freq: Every day | INTRAMUSCULAR | Status: DC

## 2022-07-22 MED ORDER — RIVAROXABAN 15 MG PO TABS: 15.0000 mg | ORAL_TABLET | Freq: Every day | ORAL | Status: DC

## 2022-07-22 MED ORDER — ONDANSETRON HCL 4 MG/2ML IJ SOLN: 4.0000 mg | Freq: Four times a day (QID) | INTRAMUSCULAR | Status: DC | PRN

## 2022-07-22 MED ORDER — TAMSULOSIN HCL 0.4 MG PO CAPS
0.4000 mg | ORAL_CAPSULE | Freq: Every day | ORAL | Status: DC
  Administered 2022-07-23 – 2022-07-29 (×7): 0.4 mg via ORAL
  Filled 2022-07-22 (×7): qty 1

## 2022-07-22 MED ORDER — FUROSEMIDE 10 MG/ML IJ SOLN
20.0000 mg | Freq: Two times a day (BID) | INTRAMUSCULAR | Status: DC
  Administered 2022-07-22 – 2022-07-23 (×2): 20 mg via INTRAVENOUS
  Filled 2022-07-22 (×2): qty 2

## 2022-07-22 NOTE — ED Notes (Signed)
ED TO INPATIENT HANDOFF REPORT  ED Nurse Name and Phone #: Loisann Roach/ [phone]  S Name/Age/Gender [name] 89 y.o. male Room/Bed: 017C/017C  Code Status   Code Status: DNR  Home/SNF/Other Home Patient oriented to: self, place, time, and situation Is this baseline? Yes   Triage Complete: Triage complete  Chief Complaint Bilateral pleural effusion [J90]  Triage Note Pt BIB [facility] from home d/t SOB.pt has a caregiver at home. Per caregiver, has been experiencing SOB for a few days, and pt was hypotensive in the 80s w EMS. Pt wears 3l O2 at baseline . Pt has a pacemaker. Hx CHF, on lasix.    Allergies No Known Allergies  Level of Care/Admitting Diagnosis ED Disposition     ED Disposition  Admit   Condition  --   Comment  Hospital Area: [place] [[id number]]  Level of Care: Med-Surg [16]  May place patient in observation at [facility] or [place] if equivalent level of care is available:: No  Covid Evaluation: Asymptomatic - no recent exposure (last 10 days) testing not required  Diagnosis: Bilateral pleural effusion [id number]  Admitting Physician: Bridgett Larsson, [place]  Attending Physician: Bridgett Larsson, [facility] [[id number]]          B Medical/Surgery History Past Medical History:  Diagnosis Date   Acute CVA (cerebrovascular accident) [facility])    Arthritis    Atrial fibrillation ([place])    CKD (chronic kidney disease)    History of hiatal hernia    Hx SBO    Last 2013 all treated conservatively   Hypertension    Presence of permanent cardiac pacemaker    Prostate cancer ([place])    Stroke ([place]) 2017   left facial drooping noted 08/14/2016   Tachycardia-bradycardia syndrome ([place])    Type II diabetes mellitus ([place])    Ventral hernia    x3   Past Surgical History:  Procedure Laterality Date   ABDOMINAL AORTOGRAM W/LOWER EXTREMITY N/A 08/07/2016   Procedure: Abdominal Aortogram w/Lower Extremity;  Surgeon: Wellington Hampshire, MD;  Location: [place] [facility];   Service: Cardiovascular;  Laterality: N/A;   CHOLECYSTECTOMY OPEN  03/31/2001   Dr Lindon Romp   COLON SURGERY     EXPLORATORY LAPAROTOMY  08/2004   Archie Endo 10/01/2010   HEMORRHOID SURGERY     HERNIA REPAIR     HIATAL HERNIA REPAIR  2002   INCISIONAL HERNIA REPAIR  08/2004   Archie Endo 10/01/2010   INSERT / REPLACE / REMOVE PACEMAKER     PERIPHERAL VASCULAR BALLOON ANGIOPLASTY  08/07/2016   Procedure: Peripheral Vascular Balloon Angioplasty;  Surgeon: Wellington Hampshire, MD;  Location: [place] [facility];  Service: Cardiovascular;;  left popliteal artery   PERIPHERAL VASCULAR INTERVENTION  08/14/2016   popliteal artery/notes 08/14/2016   PERIPHERAL VASCULAR INTERVENTION Left 08/14/2016   Procedure: Peripheral Vascular Intervention;  Surgeon: Wellington Hampshire, MD;  Location: [place] [facility];  Service: Cardiovascular;  Laterality: Left;  popliteal artery   PERMANENT PACEMAKER GENERATOR CHANGE N/A 04/06/2012   Procedure: PERMANENT PACEMAKER GENERATOR CHANGE;  Surgeon: Evans Lance, MD;  Location: [facility] CATH LAB;  Service: Cardiovascular;  Laterality: N/A;   PPM GENERATOR CHANGEOUT N/A 02/04/2022   Procedure: PPM GENERATOR CHANGEOUT;  Surgeon: Evans Lance, MD;  Location: [place] [facility];  Service: Cardiovascular;  Laterality: N/A;   SMALL INTESTINE SURGERY  09/16/2004   SBO resection, VH repair     A IV Location/Drains/Wounds Patient Lines/Drains/Airways Status     Active  Line/Drains/Airways     Name Placement date Placement time Site Days   Peripheral IV 07/22/22 20 G Anterior;Right Forearm 07/22/22  1533  Forearm  less than 1   Pressure Injury 07/09/22 Buttocks Stage 1 -  Intact skin with non-blanchable redness of a localized area usually over a bony prominence. 07/09/22  1745  -- 13            Intake/Output Last 24 hours  Intake/Output Summary (Last 24 hours) at 07/22/2022 2120 Last data filed at 07/22/2022 2015 Gross per 24 hour  Intake --  Output 400 ml  Net -400 ml     Labs/Imaging Results for orders placed or performed during the hospital encounter of 07/22/22 (from the past 48 hour(s))  CBC with Differential     Status: Abnormal   Collection Time: 07/22/22  3:57 PM  Result Value Ref Range   WBC 8.6 4.0 - 10.5 K/uL   RBC 3.52 (L) 4.22 - 5.81 MIL/uL   Hemoglobin 9.0 (L) 13.0 - 17.0 g/dL   HCT 27.4 (L) 39.0 - 52.0 %   MCV 77.8 (L) 80.0 - 100.0 fL   MCH 25.6 (L) 26.0 - 34.0 pg   MCHC 32.8 30.0 - 36.0 g/dL   RDW 17.7 (H) 11.5 - 15.5 %   Platelets 182 150 - 400 K/uL   nRBC 0.0 0.0 - 0.2 %   Neutrophils Relative % 71 %   Neutro Abs 6.2 1.7 - 7.7 K/uL   Lymphocytes Relative 17 %   Lymphs Abs 1.5 0.7 - 4.0 K/uL   Monocytes Relative 10 %   Monocytes Absolute 0.9 0.1 - 1.0 K/uL   Eosinophils Relative 1 %   Eosinophils Absolute 0.1 0.0 - 0.5 K/uL   Basophils Relative 0 %   Basophils Absolute 0.0 0.0 - 0.1 K/uL   Immature Granulocytes 1 %   Abs Immature Granulocytes 0.04 0.00 - 0.07 K/uL    Comment: Performed at [facility], [address], [place], [place] [address]  Comprehensive metabolic panel     Status: Abnormal   Collection Time: 07/22/22  3:57 PM  Result Value Ref Range   Sodium 137 135 - 145 mmol/L   Potassium 4.6 3.5 - 5.1 mmol/L   Chloride 105 98 - 111 mmol/L   CO2 22 22 - 32 mmol/L   Glucose, Bld 158 (H) 70 - 99 mg/dL    Comment: Glucose reference range applies only to samples taken after fasting for at least 8 hours.   BUN 32 (H) 8 - 23 mg/dL   Creatinine, Ser 2.48 (H) 0.61 - 1.24 mg/dL   Calcium 7.4 (L) 8.9 - 10.3 mg/dL   Total Protein 5.2 (L) 6.5 - 8.1 g/dL   Albumin 2.4 (L) 3.5 - 5.0 g/dL   AST 17 15 - 41 U/L   ALT 8 0 - 44 U/L   Alkaline Phosphatase 47 38 - 126 U/L   Total Bilirubin 0.3 0.3 - 1.2 mg/dL   GFR, Estimated 23 (L) >60 mL/min    Comment: (NOTE) Calculated using the CKD-EPI Creatinine Equation (2021)    Anion gap 10 5 - 15    Comment: Performed at [facility], [place] [address], [place],  [place] [address]  Brain natriuretic peptide     Status: Abnormal   Collection Time: 07/22/22  3:57 PM  Result Value Ref Range   B Natriuretic Peptide 326.5 (H) 0.0 - 100.0 pg/mL    Comment: Performed at [facility], [address], [place], [place] [address]  Troponin I (High Sensitivity)     Status: Abnormal   Collection Time: 07/22/22  3:57 PM  Result Value Ref Range   Troponin I (High Sensitivity) 21 (H) <18 ng/L    Comment: (NOTE) Elevated high sensitivity troponin I (hsTnI) values and significant  changes across serial measurements may suggest ACS but many other  chronic and acute conditions are known to elevate hsTnI results.  Refer to the "Links" section for chest pain algorithms and additional  guidance. Performed at [facility], [place] [address], [place], [place] [address]   Lactic acid, plasma     Status: Abnormal   Collection Time: 07/22/22  3:57 PM  Result Value Ref Range   Lactic Acid, Venous 2.1 (HH) 0.5 - 1.9 mmol/L    Comment: CRITICAL RESULT CALLED TO, READ BACK BY AND VERIFIED WITH Wayne Sever, RN @ [phone] 07/22/22 BY [facility] Performed at [facility], [place] [address] [place], [place] [address]   Resp panel by RT-PCR (RSV, Flu A&B, Covid) Anterior Nasal Swab     Status: None   Collection Time: 07/22/22  3:58 PM   Specimen: Anterior Nasal Swab  Result Value Ref Range   SARS Coronavirus 2 by RT PCR NEGATIVE NEGATIVE   Influenza A by PCR NEGATIVE NEGATIVE   Influenza B by PCR NEGATIVE NEGATIVE    Comment: (NOTE) The Xpert Xpress SARS-CoV-2/FLU/RSV plus assay is intended as an aid in the diagnosis of influenza from Nasopharyngeal swab specimens and should not be used as a sole basis for treatment. Nasal washings and aspirates are unacceptable for Xpert Xpress SARS-CoV-2/FLU/RSV testing.  Fact Sheet for Patients: EntrepreneurPulse.com.au  Fact Sheet for Healthcare Providers: IncredibleEmployment.be  This test is not  yet approved or cleared by the [place] FDA and has been authorized for detection and/or diagnosis of SARS-CoV-2 by FDA under an Emergency Use Authorization (EUA). This EUA will remain in effect (meaning this test can be used) for the duration of the COVID-19 declaration under Section 564(b)(1) of the Act, 21 U.S.C. section 360bbb-3(b)(1), unless the authorization is terminated or revoked.     Resp Syncytial Virus by PCR NEGATIVE NEGATIVE    Comment: (NOTE) Fact Sheet for Patients: EntrepreneurPulse.com.au  Fact Sheet for Healthcare Providers: IncredibleEmployment.be  This test is not yet approved or cleared by the [place] FDA and has been authorized for detection and/or diagnosis of SARS-CoV-2 by FDA under an Emergency Use Authorization (EUA). This EUA will remain in effect (meaning this test can be used) for the duration of the COVID-19 declaration under Section 564(b)(1) of the Act, 21 U.S.C. section 360bbb-3(b)(1), unless the authorization is terminated or revoked.  Performed at [facility], [place] [address], [place], [place] [address]   Urinalysis, Routine w reflex microscopic -Urine, Clean Catch     Status: None   Collection Time: 07/22/22  5:08 PM  Result Value Ref Range   Color, Urine YELLOW YELLOW   APPearance CLEAR CLEAR   Specific Gravity, Urine 1.011 1.005 - 1.030   pH 5.0 5.0 - 8.0   Glucose, UA NEGATIVE NEGATIVE mg/dL   Hgb urine dipstick NEGATIVE NEGATIVE   Bilirubin Urine NEGATIVE NEGATIVE   Ketones, ur NEGATIVE NEGATIVE mg/dL   Protein, ur NEGATIVE NEGATIVE mg/dL   Nitrite NEGATIVE NEGATIVE   Leukocytes,Ua NEGATIVE NEGATIVE    Comment: Performed at [facility] [address] [place], [place] [address]  Lactic acid, plasma  Status: Abnormal   Collection Time: 07/22/22  5:13 PM  Result Value Ref Range   Lactic Acid, Venous 2.1 (HH) 0.5 - 1.9 mmol/L    Comment: CRITICAL VALUE NOTED. VALUE IS  CONSISTENT WITH PREVIOUSLY REPORTED/CALLED VALUE Performed at [facility], [place] [address] [place], [place] [address]   Troponin I (High Sensitivity)     Status: Abnormal   Collection Time: 07/22/22  5:13 PM  Result Value Ref Range   Troponin I (High Sensitivity) 23 (H) <18 ng/L    Comment: (NOTE) Elevated high sensitivity troponin I (hsTnI) values and significant  changes across serial measurements may suggest ACS but many other  chronic and acute conditions are known to elevate hsTnI results.  Refer to the "Links" section for chest pain algorithms and additional  guidance. Performed at [facility], [place] [address] [place],  [address]    DG Chest 2 View  Result Date: 07/22/2022 CLINICAL DATA:  Dyspnea. EXAM: CHEST - 2 VIEW COMPARISON:  July 09, 2022. FINDINGS: Stable cardiomegaly. Left-sided pacemaker is unchanged in position. Bilateral pleural effusions are noted, left greater than right, with associated bibasilar atelectasis or edema. Bony thorax is unremarkable. IMPRESSION: Bilateral pleural effusions are noted, left greater than right, with associated bibasilar atelectasis or edema. Electronically Signed   By: Marijo Conception M.D.   On: 07/22/2022 16:08    Pending Labs [employer] (From admission, onward)     Start     Ordered   Signed and Held  Comprehensive metabolic panel  Tomorrow morning,   R        Signed and Held   Signed and Held  CBC with Differential/Platelet  Tomorrow morning,   R        Signed and Held   Signed and Held  Magnesium  Tomorrow morning,   R        Signed and Held            Vitals/Pain Today's Vitals   07/22/22 2015 07/22/22 2030 07/22/22 2045 07/22/22 2100  BP: 105/60 102/61 (!) 91/58 (!) 86/57  Pulse: 95 99 (!) 104 91  Resp: (!) '21 17 12 13  '$ Temp:      TempSrc:      SpO2: 100% 100% 100% 100%  PainSc:        Isolation Precautions No active isolations  Medications Medications  albumin human 25 % solution  25 g (has no administration in time range)  furosemide (LASIX) injection 20 mg (20 mg Intravenous Given 07/22/22 2116)    Mobility walks with device/ uses a cane and walker at home and transitioning to wheelchair     Focused Assessments     R Recommendations: See Admitting Provider Note  Report given to:   Additional Notes: Pt came in for SOB from home. He has a caregiver at home that helps take care of him. He has a hx of CHF and takes lasix at home. Pt wears 3L at home and he's on 3L now as well. His blood pressures are a little soft. They did a thoracentesis on him already to remove some fluid. He had '20mg'$  of lasix and he uses a urinal at bedside.

## 2022-07-22 NOTE — Assessment & Plan Note (Signed)
Verified with pt's grand-dtr [name] that pt is a DNR/DNI

## 2022-07-22 NOTE — ED Provider Notes (Signed)
[facility] Provider Note   CSN: [id number] Arrival date & time: 07/22/22  1451     History  Chief Complaint  Patient presents with   Shortness of Breath    [name] is a 89 y.o. male.  This is a 89 year old male with history of CHF on Lasix, A-fib on Xarelto, CKD, bradycardia/tachycardia syndrome status post pacemaker and CVA presenting to the ED for shortness of breath.  Patient states she has had intermittent shortness of breath for the last few days, it is exacerbated with exertion, lying flat, improved with rest and sitting up.  He denies any missed doses of his medications including his Lasix and Xarelto.  Wears 3 L nasal cannula at baseline, upon EMS arrival patient was hypotensive in the 80s with EMS which self resolved.  He denies any chest pain, abdominal pain, nausea or vomiting.        Home Medications Prior to Admission medications   Medication Sig Start Date End Date Taking? Authorizing Provider  divalproex (DEPAKOTE SPRINKLE) 125 MG capsule Take 1 capsule (125 mg total) by mouth 2 (two) times daily. 05/27/22 07/15/22  [name], MD  docusate sodium (COLACE) 100 MG capsule Take 1 capsule (100 mg total) by mouth daily. 07/10/22   Amin, Ankit Chirag, MD  finasteride (PROSCAR) 5 MG tablet TAKE 1 TABLET (5 MG TOTAL) BY MOUTH DAILY. 06/07/22   [name], MD  furosemide (LASIX) 20 MG tablet Take 1 tablet (20 mg total) by mouth daily. 07/10/22   Amin, Jeanella Flattery, MD  Iron, Ferrous Sulfate, 325 (65 Fe) MG TABS Take 1 tablet every other day. Patient taking differently: Take 1 tablet by mouth every other day. 06/13/22   [name], MD  latanoprost (XALATAN) 0.005 % ophthalmic solution Place 1 drop into both eyes at bedtime. 05/03/20   [provider]  loratadine (CLARITIN) 10 MG tablet Take 10 mg by mouth every other day.    [provider]  Melatonin 3 MG TBDP Take 1 tablet  by mouth at bedtime. 07/10/22   Amin, Jeanella Flattery, MD  memantine (NAMENDA) 10 MG tablet Take 10 mg by mouth 2 (two) times daily.    [provider]  metoprolol succinate (TOPROL-XL) 25 MG 24 hr tablet Take 1 tablet (25 mg total) by mouth daily. 01/02/22   Geradine Girt, DO  pantoprazole (PROTONIX) 40 MG tablet TAKE 1 TABLET(40 MG) BY MOUTH DAILY Patient taking differently: Take 40 mg by mouth daily. 01/01/22   [name], MD  pioglitazone (ACTOS) 15 MG tablet Take 1 tablet (15 mg total) by mouth daily. 02/14/22   [name], MD  polyethylene glycol powder [facility]) 17 GM/SCOOP powder Take 17 g by mouth 2 (two) times daily as needed. Patient taking differently: Take 17 g by mouth 2 (two) times daily as needed for mild constipation. 08/03/21   McElwee, Scheryl Darter, NP  Rivaroxaban (XARELTO) 15 MG TABS tablet TAKE 1 TABLET DAILY WITH SUPPER Patient taking differently: Take 15 mg by mouth daily with supper. 05/22/22   [name], MD  tamsulosin (FLOMAX) 0.4 MG CAPS capsule Take 1 capsule (0.4 mg total) by mouth daily. 12/04/21   [name], MD  traZODone (DESYREL) 50 MG tablet Take 1 tablet (50 mg total) by mouth at bedtime as needed for sleep. 07/10/22   Damita Lack, MD      Allergies    Patient has no known  allergies.    Review of Systems   Review of Systems  Constitutional:  Negative for chills, fatigue and fever.  Respiratory:  Positive for shortness of breath.   Cardiovascular:  Negative for chest pain.  Gastrointestinal:  Negative for abdominal pain.  Neurological:  Negative for dizziness.    Physical Exam Updated Vital Signs BP 94/82   Pulse 100   Temp 97.6 F (36.4 C) (Oral)   Resp (!) 22   SpO2 100%  Physical Exam Vitals and nursing note reviewed.  Constitutional:      General: He is not in acute distress. Cardiovascular:     Rate and Rhythm: Tachycardia present. Rhythm irregular. No extrasystoles are  present.    Pulses: No decreased pulses.     Heart sounds: No murmur heard.    No gallop.  Pulmonary:     Effort: Pulmonary effort is normal. Tachypnea present.     Breath sounds: No decreased breath sounds, wheezing, rhonchi or rales.  Musculoskeletal:     Right lower leg: Edema present.     Left lower leg: Edema present.  Skin:    General: Skin is warm.     Capillary Refill: Capillary refill takes less than 2 seconds.  Neurological:     General: No focal deficit present.     Mental Status: He is alert and oriented to person, place, and time.     Cranial Nerves: No cranial nerve deficit.     ED Results / Procedures / Treatments   Labs (all labs ordered are listed, but only abnormal results are displayed) Labs Reviewed - No data to display  EKG EKG Interpretation  Date/Time:  Monday July 22 2022 15:02:03 EST Ventricular Rate:  90 PR Interval:    QRS Duration: 95 QT Interval:  390 QTC Calculation: 478 R Axis:   12 Text Interpretation: Atrial fibrillation Anteroseptal infarct, age indeterminate Lateral leads are also involved when comapred to prior, still afib sith some t wave inversions similar to october 2023 no STEMI Confirmed by Antony Blackbird [phone]) on 07/22/2022 3:05:56 PM  Radiology No results found.  Procedures Procedures    Medications Ordered in ED Medications - No data to display  ED Course/ Medical Decision Making/ A&P                             Medical Decision Making Differential diagnosis includes pericardial effusion, pneumothorax, pneumonia, anemia, CHF exacerbation, PE, ACS  Patient presents chronically ill-appearing, complaining of worsening shortness of breath upon lying flat and it is exertional.  I am concerned for possible CHF exacerbation versus pleural effusion, lower suspicion for PE given he is already anticoagulated.  Will obtain a chest x-ray to evaluate for focal consolidation and effusion, basic labs including a CBC, CMP, BNP and  serial troponins to evaluate for ACS.  EKG and lactic acid ordered.  I personally reviewed and interpreted patient's imaging which shows bilateral pleural effusions with no obvious focal consolidation, there is also some intermittent pulmonary edema.    I personally reviewed and interpreted patient's EKG which shows atrial fibrillation with rate 90, when compared with prior there are no acute ischemic changes.    I personally reviewed and interpreted patient's labs which show an AKI with a creatinine of 2.48, troponin of 21 and 23 respectively with a BNP of 326, lactic acid elevated at 2.1, hemoglobin stable at 9.0.  Patient's presentation most consistent with reaccumulation of pleural effusion and possible  congestive heart failure.  Low suspicion for ACS given he is not experiencing chest pain and his troponin trend is not compatible with ischemia.  I called and discussed this case with the hospitalist team who agreed admit patient to their service for further management and workup.  Patient was stable at discharge.  Problems Addressed: Hypoxia: acute illness or injury that poses a threat to life or bodily functions Pleural effusion: acute illness or injury that poses a threat to life or bodily functions  Amount and/or Complexity of Data Reviewed Labs: ordered. Decision-making details documented in ED Course. Radiology: ordered and independent interpretation performed. Decision-making details documented in ED Course. ECG/medicine tests: ordered and independent interpretation performed. Decision-making details documented in ED Course.  Risk Decision regarding hospitalization.         Final Clinical Impression(s) / ED Diagnoses Final diagnoses:  None    Rx / DC Orders ED Discharge Orders     None         Jimmie Molly, MD 07/22/22 2234    Tegeler, Gwenyth Allegra, MD 07/25/22 1346

## 2022-07-22 NOTE — H&P (Addendum)
History and Physical    [name] [id number] DOB: 02-15-1924 DOA: 07/22/2022  DOS: the patient was seen and examined on 07/22/2022  PCP: [name], MD   Patient coming from: Home  I have personally briefly reviewed patient's old medical records in [place]  CC: hypoxia HPI: 89 year old male history of diastolic heart failure, A-fib/tachybradycardia syndrome status post permanent pacemaker, CKD stage IIIb/IV, diabetes, dementia, known bilateral pleural effusions, chronic hypoxic respite failure on 4 L of oxygen presents to ER today with worsening shortness of breath and reported hypotension.  Patient lives at home with his granddaughter.  Granddaughter states that patient has 24-hour care with nursing.  She was informed by the home nurse today the patient was short of breath.  Reportedly was hypotensive with EMS to the 80s.  Has not had any fevers.  Patient brought to the ER.  On arrival temp 97.6 heart rate 100 blood pressure 94/82 satting on percent on 3 L.  Labs showed a white count 8.6, hemoglobin 9.0, platelets of 182  Sodium 137, potassium 4.6, BUN of 32, creatinine 2.48  BNP was 326.  Last week when he was admitted it was 365.  [facility] hospitalist contacted for admission.    ED Course: CXR bilateral pleural effusions  Review of Systems:  Review of Systems  Unable to perform ROS: Dementia    Past Medical History:  Diagnosis Date   Acute CVA (cerebrovascular accident) [facility])    Arthritis    Atrial fibrillation ([place])    CKD (chronic kidney disease)    History of hiatal hernia    Hx SBO    Last 2013 all treated conservatively   Hypertension    Presence of permanent cardiac pacemaker    Prostate cancer ([place])    Stroke ([place]) 2017   left facial drooping noted 08/14/2016   Tachycardia-bradycardia syndrome ([place])    Type II diabetes mellitus ([place])    Ventral hernia    x3    Past Surgical History:  Procedure Laterality Date   ABDOMINAL  AORTOGRAM W/LOWER EXTREMITY N/A 08/07/2016   Procedure: Abdominal Aortogram w/Lower Extremity;  Surgeon: Wellington Hampshire, MD;  Location: [place] [facility];  Service: Cardiovascular;  Laterality: N/A;   CHOLECYSTECTOMY OPEN  03/31/2001   Dr Lindon Romp   COLON SURGERY     EXPLORATORY LAPAROTOMY  08/2004   Archie Endo 10/01/2010   HEMORRHOID SURGERY     HERNIA REPAIR     HIATAL HERNIA REPAIR  2002   INCISIONAL HERNIA REPAIR  08/2004   Archie Endo 10/01/2010   INSERT / REPLACE / REMOVE PACEMAKER     PERIPHERAL VASCULAR BALLOON ANGIOPLASTY  08/07/2016   Procedure: Peripheral Vascular Balloon Angioplasty;  Surgeon: Wellington Hampshire, MD;  Location: [place] [facility];  Service: Cardiovascular;;  left popliteal artery   PERIPHERAL VASCULAR INTERVENTION  08/14/2016   popliteal artery/notes 08/14/2016   PERIPHERAL VASCULAR INTERVENTION Left 08/14/2016   Procedure: Peripheral Vascular Intervention;  Surgeon: Wellington Hampshire, MD;  Location: [place] [facility];  Service: Cardiovascular;  Laterality: Left;  popliteal artery   PERMANENT PACEMAKER GENERATOR CHANGE N/A 04/06/2012   Procedure: PERMANENT PACEMAKER GENERATOR CHANGE;  Surgeon: Evans Lance, MD;  [facility] C[facility];  Service: Cardiovascular;  Laterality: N/A;   PPM GENERATOR CHANGEOUT N/A 02/04/2022   Procedure: PPM GENERATOR CHANGEOUT;  Surgeon: Evans Lance, MD;  Location: [place] [facility];  Service: Cardiovascular;  Laterality: N/A;   SMALL INTESTINE SURGERY  09/16/2004  SBO resection, VH repair     reports that he quit smoking about 67 years ago. His smoking use included cigarettes. He has a 22.50 pack-year smoking history. He has been exposed to tobacco smoke. He quit smokeless tobacco use about 69 years ago.  His smokeless tobacco use included chew. He reports that he does not drink alcohol and does not use drugs.  No Known Allergies  Family History  Problem Relation Age of Onset   Pneumonia Father    Stroke Brother    Stroke Sister     Cancer Brother        type unknown    Prior to Admission medications   Medication Sig Start Date End Date Taking? Authorizing Provider  divalproex (DEPAKOTE SPRINKLE) 125 MG capsule Take 1 capsule (125 mg total) by mouth 2 (two) times daily. 05/27/22 07/15/22  [name], MD  docusate sodium (COLACE) 100 MG capsule Take 1 capsule (100 mg total) by mouth daily. 07/10/22   Amin, Ankit Chirag, MD  finasteride (PROSCAR) 5 MG tablet TAKE 1 TABLET (5 MG TOTAL) BY MOUTH DAILY. 06/07/22   [name], MD  furosemide (LASIX) 20 MG tablet Take 1 tablet (20 mg total) by mouth daily. 07/10/22   Amin, Jeanella Flattery, MD  Iron, Ferrous Sulfate, 325 (65 Fe) MG TABS Take 1 tablet every other day. Patient taking differently: Take 1 tablet by mouth every other day. 06/13/22   [name], MD  latanoprost (XALATAN) 0.005 % ophthalmic solution Place 1 drop into both eyes at bedtime. 05/03/20   [provider]  loratadine (CLARITIN) 10 MG tablet Take 10 mg by mouth every other day.    [provider]  Melatonin 3 MG TBDP Take 1 tablet by mouth at bedtime. 07/10/22   Amin, Jeanella Flattery, MD  memantine (NAMENDA) 10 MG tablet Take 10 mg by mouth 2 (two) times daily.    [provider]  metoprolol succinate (TOPROL-XL) 25 MG 24 hr tablet Take 1 tablet (25 mg total) by mouth daily. 01/02/22   Geradine Girt, DO  pantoprazole (PROTONIX) 40 MG tablet TAKE 1 TABLET(40 MG) BY MOUTH DAILY Patient taking differently: Take 40 mg by mouth daily. 01/01/22   [name], MD  pioglitazone (ACTOS) 15 MG tablet Take 1 tablet (15 mg total) by mouth daily. 02/14/22   [name], MD  polyethylene glycol powder [facility]) 17 GM/SCOOP powder Take 17 g by mouth 2 (two) times daily as needed. Patient taking differently: Take 17 g by mouth 2 (two) times daily as needed for mild constipation. 08/03/21   McElwee, Scheryl Darter, NP  Rivaroxaban (XARELTO) 15 MG TABS tablet  TAKE 1 TABLET DAILY WITH SUPPER Patient taking differently: Take 15 mg by mouth daily with supper. 05/22/22   [name], MD  tamsulosin (FLOMAX) 0.4 MG CAPS capsule Take 1 capsule (0.4 mg total) by mouth daily. 12/04/21   [name], MD  traZODone (DESYREL) 50 MG tablet Take 1 tablet (50 mg total) by mouth at bedtime as needed for sleep. 07/10/22   Damita Lack, MD    Physical Exam: Vitals:   07/22/22 1830 07/22/22 1845 07/22/22 1900 07/22/22 2012  BP: 100/75 126/74 108/78   Pulse: 96 (!) 54 98   Resp: '20 10 17   '$ Temp:    (!) 97.3 F (36.3 C)  TempSrc:    Oral  SpO2: 100% 100% 100%     Physical Exam Vitals and nursing note reviewed.  Constitutional:      General: He is not in acute distress.    Appearance: He is not toxic-appearing.     Comments: Chronically ill appearing  HENT:     Head: Normocephalic and atraumatic.     Nose: Nose normal.  Cardiovascular:     Rate and Rhythm: Normal rate. Rhythm irregular.     Heart sounds: Murmur heard.  Pulmonary:     Effort: No respiratory distress.     Breath sounds: Examination of the right-lower field reveals decreased breath sounds. Examination of the left-lower field reveals decreased breath sounds. Decreased breath sounds present.  Abdominal:     General: Abdomen is flat. Bowel sounds are normal. There is no distension.     Palpations: Abdomen is soft.  Musculoskeletal:     Comments: Bilateral LE/feet wrapped in Unna boots +1-2 hard edema of posterior thighs and +1 edema of posterior flanks bilaterally  Skin:    Capillary Refill: Capillary refill takes less than 2 seconds.  Neurological:     Mental Status: He is disoriented.      Labs on Admission: I have personally reviewed following labs and imaging studies  CBC: Recent Labs  Lab 07/22/22 1557  WBC 8.6  NEUTROABS 6.2  HGB 9.0*  HCT 27.4*  MCV 77.8*  PLT [id number]   Basic Metabolic Panel: Recent Labs  Lab 07/22/22 1557  NA 137  K 4.6   CL 105  CO2 22  GLUCOSE 158*  BUN 32*  CREATININE 2.48*  CALCIUM 7.4*   GFR: Estimated Creatinine Clearance: 18 mL/min (A) (by C-G formula based on SCr of 2.48 mg/dL (H)). Liver Function Tests: Recent Labs  Lab 07/22/22 1557  AST 17  ALT 8  ALKPHOS 47  BILITOT 0.3  PROT 5.2*  ALBUMIN 2.4*    Cardiac Enzymes: Recent Labs  Lab 07/22/22 1557 07/22/22 1713  TROPONINIHS 21* 23*   BNP (last 3 results) Recent Labs    12/05/21 1414 07/06/22 1230 07/22/22 1557  BNP 531.7* 365.0* 326.5*   Urine analysis:    Component Value Date/Time   COLORURINE YELLOW 07/22/2022 1708   APPEARANCEUR CLEAR 07/22/2022 1708   LABSPEC 1.011 07/22/2022 1708   PHURINE 5.0 07/22/2022 1708   GLUCOSEU NEGATIVE 07/22/2022 1708   GLUCOSEU NEGATIVE 10/30/2020 1051   HGBUR NEGATIVE 07/22/2022 1708   BILIRUBINUR NEGATIVE 07/22/2022 1708   BILIRUBINUR neg 08/03/2021 1702   KETONESUR NEGATIVE 07/22/2022 1708   PROTEINUR NEGATIVE 07/22/2022 1708   UROBILINOGEN 1.0 08/03/2021 1702   UROBILINOGEN 0.2 10/30/2020 1051   NITRITE NEGATIVE 07/22/2022 1708   LEUKOCYTESUR NEGATIVE 07/22/2022 1708    Radiological Exams on Admission: I have personally reviewed images DG Chest 2 View  Result Date: 07/22/2022 CLINICAL DATA:  Dyspnea. EXAM: CHEST - 2 VIEW COMPARISON:  July 09, 2022. FINDINGS: Stable cardiomegaly. Left-sided pacemaker is unchanged in position. Bilateral pleural effusions are noted, left greater than right, with associated bibasilar atelectasis or edema. Bony thorax is unremarkable. IMPRESSION: Bilateral pleural effusions are noted, left greater than right, with associated bibasilar atelectasis or edema. Electronically Signed   By: Marijo Conception M.D.   On: 07/22/2022 16:08    EKG: My personal interpretation of EKG shows: afib    Assessment/Plan Principal Problem:   Bilateral pleural effusion Active Problems:   Edema due to hypoalbuminemia   Type 2 diabetes mellitus without  complication, without long-term current use of insulin ([name])   PPM-Medtronic   Chronic diastolic CHF (congestive heart failure) ([place])   Stage  3b chronic kidney disease ([place])   Essential hypertension   Alzheimer disease ([place])   DNR (do not resuscitate)/DNI(Do Not Intubate)   Chronic hypoxic respiratory failure, on home oxygen therapy ([place]) - 4 L/min    Assessment and Plan: * Bilateral pleural effusion Observation med/surg bed. Pt had left sided thoracentesis on 07-09-2022. Pt sent home on 20 mg lasix. Pt does not manage his own meds. Grand-dtr states she has around the clock nursing for patient. Pt has been taking his meds. Not following low salt diet. I think part of the issue with his recurring pleural effusions is his hypoalbuminemia. Will plan for bilateral thoracentesis. Left side on 07-23-2022 and right side on 07-24-2022. Unless radiology can complete both sides on the same day. Ordered as left side on 07-23-2022 and right side on 07-24-2022. Will give 25 g IV albumin q12h x 2 doses and IV lasix 20 mg bid.  Edema due to hypoalbuminemia Will start albumin-assisted diuresis with 25 grams q12h x 2 doses and IV lasix 20 mg q12h. Advised grand-dtr that he needs better protein intake. Edema will not improve if his diet remains poor in protein.  Chronic hypoxic respiratory failure, on home oxygen therapy ([name]) - 4 L/min Grand-dtr states that home oxygen company needs hospital documentation that pt needs 4 L/min continuously.  DNR (do not resuscitate)/DNI(Do Not Intubate) Verified with pt's grand-dtr [name] that pt is a DNR/DNI  Alzheimer disease ([place]) Chronic.  Essential hypertension Continue toprol-xl 25 mg  Stage 3b chronic kidney disease ([name]) Worsening. He probably really is CKD stage 4, not 3b. At 89 yo, he is not a dialysis candidate.  Chronic diastolic CHF (congestive heart failure) ([name]) Stable.  PPM-Medtronic Chronic.  Type 2 diabetes mellitus without complication, without  long-term current use of insulin ([place]) Add ssi. Consider stopping Actos as it can cause fluid retention   DVT prophylaxis: Xarelto Code Status: DNR/DNI(Do NOT Intubate). Verified with grand-dtr [name] Family Communication: discussed at bedside with grand-dtr [name]  Disposition Plan: return home  Consults called: none  Admission status: Observation, Med-Surg   Kristopher Oppenheim, DO [facility] 07/22/2022, 8:41 PM

## 2022-07-22 NOTE — Assessment & Plan Note (Signed)
Chronic. 

## 2022-07-22 NOTE — Assessment & Plan Note (Addendum)
Add ssi. Consider stopping Actos as it can cause fluid retention

## 2022-07-22 NOTE — Subjective & Objective (Addendum)
CC: hypoxia HPI: 89 year old male history of diastolic heart failure, A-fib/tachybradycardia syndrome status post permanent pacemaker, CKD stage IIIb/IV, diabetes, dementia, known bilateral pleural effusions, chronic hypoxic respite failure on 4 L of oxygen presents to ER today with worsening shortness of breath and reported hypotension.  Patient lives at home with his granddaughter.  Granddaughter states that patient has 24-hour care with nursing.  She was informed by the home nurse today the patient was short of breath.  Reportedly was hypotensive with EMS to the 80s.  Has not had any fevers.  Patient brought to the ER.  On arrival temp 97.6 heart rate 100 blood pressure 94/82 satting on percent on 3 L.  Labs showed a white count 8.6, hemoglobin 9.0, platelets of 182  Sodium 137, potassium 4.6, BUN of 32, creatinine 2.48  BNP was 326.  Last week when he was admitted it was 365.  [facility] hospitalist contacted for admission.

## 2022-07-22 NOTE — Assessment & Plan Note (Signed)
Worsening. He probably really is CKD stage 4, not 3b. At 89 yo, he is not a dialysis candidate.

## 2022-07-22 NOTE — Assessment & Plan Note (Signed)
Continue toprol-xl 25 mg

## 2022-07-22 NOTE — Assessment & Plan Note (Signed)
Observation med/surg bed. Pt had left sided thoracentesis on 07-09-2022. Pt sent home on 20 mg lasix. Pt does not manage his own meds. Grand-dtr states she has around the clock nursing for patient. Pt has been taking his meds. Not following low salt diet. I think part of the issue with his recurring pleural effusions is his hypoalbuminemia. Will plan for bilateral thoracentesis. Left side on 07-23-2022 and right side on 07-24-2022. Unless radiology can complete both sides on the same day. Ordered as left side on 07-23-2022 and right side on 07-24-2022. Will give 25 g IV albumin q12h x 2 doses and IV lasix 20 mg bid.

## 2022-07-22 NOTE — Assessment & Plan Note (Signed)
Stable. 

## 2022-07-22 NOTE — ED Triage Notes (Signed)
Pt BIB [facility] from home d/t SOB.pt has a caregiver at home. Per caregiver, has been experiencing SOB for a few days, and pt was hypotensive in the 80s w EMS. Pt wears 3l O2 at baseline . Pt has a pacemaker. Hx CHF, on lasix.

## 2022-07-22 NOTE — Assessment & Plan Note (Signed)
Will start albumin-assisted diuresis with 25 grams q12h x 2 doses and IV lasix 20 mg q12h. Advised grand-dtr that he needs better protein intake. Edema will not improve if his diet remains poor in protein.

## 2022-07-22 NOTE — Assessment & Plan Note (Signed)
Grand-dtr states that home oxygen company needs hospital documentation that pt needs 4 L/min continuously.

## 2022-07-23 ENCOUNTER — Observation Stay (HOSPITAL_COMMUNITY): Payer: No Typology Code available for payment source

## 2022-07-23 DIAGNOSIS — Z9981 Dependence on supplemental oxygen: Secondary | ICD-10-CM | POA: Diagnosis not present

## 2022-07-23 DIAGNOSIS — E8809 Other disorders of plasma-protein metabolism, not elsewhere classified: Secondary | ICD-10-CM | POA: Diagnosis not present

## 2022-07-23 DIAGNOSIS — Z23 Encounter for immunization: Secondary | ICD-10-CM | POA: Diagnosis not present

## 2022-07-23 DIAGNOSIS — I3139 Other pericardial effusion (noninflammatory): Secondary | ICD-10-CM | POA: Diagnosis not present

## 2022-07-23 DIAGNOSIS — Z1152 Encounter for screening for COVID-19: Secondary | ICD-10-CM | POA: Diagnosis not present

## 2022-07-23 DIAGNOSIS — M150 Primary generalized (osteo)arthritis: Secondary | ICD-10-CM | POA: Diagnosis not present

## 2022-07-23 DIAGNOSIS — Z66 Do not resuscitate: Secondary | ICD-10-CM | POA: Diagnosis present

## 2022-07-23 DIAGNOSIS — Z79899 Other long term (current) drug therapy: Secondary | ICD-10-CM | POA: Diagnosis not present

## 2022-07-23 DIAGNOSIS — J918 Pleural effusion in other conditions classified elsewhere: Secondary | ICD-10-CM | POA: Diagnosis not present

## 2022-07-23 DIAGNOSIS — I5033 Acute on chronic diastolic (congestive) heart failure: Secondary | ICD-10-CM | POA: Diagnosis present

## 2022-07-23 DIAGNOSIS — E119 Type 2 diabetes mellitus without complications: Secondary | ICD-10-CM

## 2022-07-23 DIAGNOSIS — J90 Pleural effusion, not elsewhere classified: Secondary | ICD-10-CM | POA: Diagnosis not present

## 2022-07-23 DIAGNOSIS — J439 Emphysema, unspecified: Secondary | ICD-10-CM | POA: Diagnosis not present

## 2022-07-23 DIAGNOSIS — K5901 Slow transit constipation: Secondary | ICD-10-CM | POA: Diagnosis not present

## 2022-07-23 DIAGNOSIS — I5030 Unspecified diastolic (congestive) heart failure: Secondary | ICD-10-CM | POA: Diagnosis not present

## 2022-07-23 DIAGNOSIS — E559 Vitamin D deficiency, unspecified: Secondary | ICD-10-CM | POA: Diagnosis not present

## 2022-07-23 DIAGNOSIS — N179 Acute kidney failure, unspecified: Secondary | ICD-10-CM | POA: Diagnosis present

## 2022-07-23 DIAGNOSIS — N138 Other obstructive and reflux uropathy: Secondary | ICD-10-CM | POA: Diagnosis not present

## 2022-07-23 DIAGNOSIS — I5032 Chronic diastolic (congestive) heart failure: Secondary | ICD-10-CM | POA: Diagnosis not present

## 2022-07-23 DIAGNOSIS — H409 Unspecified glaucoma: Secondary | ICD-10-CM | POA: Diagnosis not present

## 2022-07-23 DIAGNOSIS — J9621 Acute and chronic respiratory failure with hypoxia: Secondary | ICD-10-CM | POA: Diagnosis present

## 2022-07-23 DIAGNOSIS — J9611 Chronic respiratory failure with hypoxia: Secondary | ICD-10-CM | POA: Diagnosis not present

## 2022-07-23 DIAGNOSIS — I959 Hypotension, unspecified: Secondary | ICD-10-CM | POA: Diagnosis not present

## 2022-07-23 DIAGNOSIS — Z8546 Personal history of malignant neoplasm of prostate: Secondary | ICD-10-CM | POA: Diagnosis not present

## 2022-07-23 DIAGNOSIS — Z8673 Personal history of transient ischemic attack (TIA), and cerebral infarction without residual deficits: Secondary | ICD-10-CM | POA: Diagnosis not present

## 2022-07-23 DIAGNOSIS — G4700 Insomnia, unspecified: Secondary | ICD-10-CM | POA: Diagnosis not present

## 2022-07-23 DIAGNOSIS — Z7901 Long term (current) use of anticoagulants: Secondary | ICD-10-CM | POA: Diagnosis not present

## 2022-07-23 DIAGNOSIS — I130 Hypertensive heart and chronic kidney disease with heart failure and stage 1 through stage 4 chronic kidney disease, or unspecified chronic kidney disease: Secondary | ICD-10-CM | POA: Diagnosis present

## 2022-07-23 DIAGNOSIS — E43 Unspecified severe protein-calorie malnutrition: Secondary | ICD-10-CM | POA: Diagnosis not present

## 2022-07-23 DIAGNOSIS — N401 Enlarged prostate with lower urinary tract symptoms: Secondary | ICD-10-CM | POA: Diagnosis not present

## 2022-07-23 DIAGNOSIS — M6281 Muscle weakness (generalized): Secondary | ICD-10-CM | POA: Diagnosis not present

## 2022-07-23 DIAGNOSIS — K219 Gastro-esophageal reflux disease without esophagitis: Secondary | ICD-10-CM | POA: Diagnosis not present

## 2022-07-23 DIAGNOSIS — I4891 Unspecified atrial fibrillation: Secondary | ICD-10-CM | POA: Diagnosis present

## 2022-07-23 DIAGNOSIS — F0280 Dementia in other diseases classified elsewhere without behavioral disturbance: Secondary | ICD-10-CM | POA: Diagnosis present

## 2022-07-23 DIAGNOSIS — Z823 Family history of stroke: Secondary | ICD-10-CM | POA: Diagnosis not present

## 2022-07-23 DIAGNOSIS — G309 Alzheimer's disease, unspecified: Secondary | ICD-10-CM | POA: Diagnosis not present

## 2022-07-23 DIAGNOSIS — D649 Anemia, unspecified: Secondary | ICD-10-CM | POA: Diagnosis present

## 2022-07-23 DIAGNOSIS — E875 Hyperkalemia: Secondary | ICD-10-CM | POA: Diagnosis not present

## 2022-07-23 DIAGNOSIS — I482 Chronic atrial fibrillation, unspecified: Secondary | ICD-10-CM | POA: Diagnosis not present

## 2022-07-23 DIAGNOSIS — Z794 Long term (current) use of insulin: Secondary | ICD-10-CM | POA: Diagnosis not present

## 2022-07-23 DIAGNOSIS — G629 Polyneuropathy, unspecified: Secondary | ICD-10-CM | POA: Diagnosis not present

## 2022-07-23 DIAGNOSIS — N1832 Chronic kidney disease, stage 3b: Secondary | ICD-10-CM | POA: Diagnosis not present

## 2022-07-23 DIAGNOSIS — Z91119 Patient's noncompliance with dietary regimen due to unspecified reason: Secondary | ICD-10-CM | POA: Diagnosis not present

## 2022-07-23 DIAGNOSIS — E1122 Type 2 diabetes mellitus with diabetic chronic kidney disease: Secondary | ICD-10-CM | POA: Diagnosis present

## 2022-07-23 DIAGNOSIS — Z950 Presence of cardiac pacemaker: Secondary | ICD-10-CM | POA: Diagnosis not present

## 2022-07-23 HISTORY — PX: IR THORACENTESIS ASP PLEURAL SPACE W/IMG GUIDE: IMG5380

## 2022-07-23 LAB — COMPREHENSIVE METABOLIC PANEL
ALT: 8 U/L (ref 0–44)
AST: 14 U/L — ABNORMAL LOW (ref 15–41)
Albumin: 2.7 g/dL — ABNORMAL LOW (ref 3.5–5.0)
Alkaline Phosphatase: 58 U/L (ref 38–126)
Anion gap: 9 (ref 5–15)
BUN: 34 mg/dL — ABNORMAL HIGH (ref 8–23)
CO2: 24 mmol/L (ref 22–32)
Calcium: 8 mg/dL — ABNORMAL LOW (ref 8.9–10.3)
Chloride: 105 mmol/L (ref 98–111)
Creatinine, Ser: 2.54 mg/dL — ABNORMAL HIGH (ref 0.61–1.24)
GFR, Estimated: 23 mL/min — ABNORMAL LOW (ref >60)
Glucose, Bld: 132 mg/dL — ABNORMAL HIGH (ref 70–99)
Potassium: 4.6 mmol/L (ref 3.5–5.1)
Sodium: 138 mmol/L (ref 135–145)
Total Bilirubin: 0.7 mg/dL (ref 0.3–1.2)
Total Protein: 5.8 g/dL — ABNORMAL LOW (ref 6.5–8.1)

## 2022-07-23 LAB — CBC WITH DIFFERENTIAL/PLATELET
Abs Immature Granulocytes: 0.04 10*3/uL (ref 0.00–0.07)
Basophils Absolute: 0 10*3/uL (ref 0.0–0.1)
Basophils Relative: 0 %
Eosinophils Absolute: 0.1 10*3/uL (ref 0.0–0.5)
Eosinophils Relative: 1 %
HCT: 28.5 % — ABNORMAL LOW (ref 39.0–52.0)
Hemoglobin: 9.8 g/dL — ABNORMAL LOW (ref 13.0–17.0)
Immature Granulocytes: 1 %
Lymphocytes Relative: 14 %
Lymphs Abs: 0.9 10*3/uL (ref 0.7–4.0)
MCH: 25.6 pg — ABNORMAL LOW (ref 26.0–34.0)
MCHC: 34.4 g/dL (ref 30.0–36.0)
MCV: 74.4 fL — ABNORMAL LOW (ref 80.0–100.0)
Monocytes Absolute: 0.8 10*3/uL (ref 0.1–1.0)
Monocytes Relative: 12 %
Neutro Abs: 4.7 10*3/uL (ref 1.7–7.7)
Neutrophils Relative %: 72 %
Platelets: 192 10*3/uL (ref 150–400)
RBC: 3.83 MIL/uL — ABNORMAL LOW (ref 4.22–5.81)
RDW: 17.4 % — ABNORMAL HIGH (ref 11.5–15.5)
WBC: 6.5 10*3/uL (ref 4.0–10.5)
nRBC: 0 % (ref 0.0–0.2)

## 2022-07-23 LAB — MAGNESIUM: Magnesium: 2 mg/dL (ref 1.7–2.4)

## 2022-07-23 LAB — GLUCOSE, CAPILLARY
Glucose-Capillary: 124 mg/dL — ABNORMAL HIGH (ref 70–99)
Glucose-Capillary: 117 mg/dL — ABNORMAL HIGH (ref 70–99)
Glucose-Capillary: 177 mg/dL — ABNORMAL HIGH (ref 70–99)
Glucose-Capillary: 126 mg/dL — ABNORMAL HIGH (ref 70–99)
Glucose-Capillary: 135 mg/dL — ABNORMAL HIGH (ref 70–99)

## 2022-07-23 MED ORDER — SODIUM CHLORIDE 0.9 % IV BOLUS
250.0000 mL | Freq: Once | INTRAVENOUS | Status: AC
  Administered 2022-07-23: 250 mL via INTRAVENOUS

## 2022-07-23 MED ORDER — LATANOPROST 0.005 % OP SOLN
1.0000 [drp] | Freq: Every day | OPHTHALMIC | Status: DC
  Administered 2022-07-23 – 2022-07-28 (×6): 1 [drp] via OPHTHALMIC
  Filled 2022-07-23: qty 2.5

## 2022-07-23 MED ORDER — MEMANTINE HCL 10 MG PO TABS
10.0000 mg | ORAL_TABLET | Freq: Two times a day (BID) | ORAL | Status: DC
  Administered 2022-07-23 – 2022-07-29 (×13): 10 mg via ORAL
  Filled 2022-07-23 (×13): qty 1

## 2022-07-23 MED ORDER — LIDOCAINE HCL 1 % IJ SOLN
INTRAMUSCULAR | Status: AC
  Administered 2022-07-23: 10 mL
  Filled 2022-07-23: qty 20

## 2022-07-23 NOTE — Progress Notes (Signed)
Ok to hold Xarelto for plan thoracentesis per Dr. Sarajane Jews. Consider using apixaban instead when anticoagulation is resumed due to this cutoff of CrCl.  Onnie Boer, PharmD, BCIDP, AAHIVP, CPP Infectious Disease Pharmacist 07/23/2022 11:45 AM

## 2022-07-23 NOTE — Hospital Course (Addendum)
89 year old man PMH including diastolic CHF, CKD, diabetes mellitus type 2, chronic hypoxic respiratory failure on 4 L presented to the emergency department with shortness of breath, reported hypotension by EMS.  Recently hospitalized and underwent left thoracentesis.  PMH includes bilateral pleural effusions.  Admitted for shortness of breath and treatment for pleural effusion.  Lives at home with granddaughter has 24-hour nursing.

## 2022-07-23 NOTE — NC FL2 (Signed)
[place] LEVEL OF CARE FORM     IDENTIFICATION  Patient Name: [name] Birthdate: 06-18-1923 Sex: male Admission Date (Current Location): 07/22/2022  [facility] and [place] Number:  Herbalist and Address:  [facility] [place], [place] [address] [place], [place] [address]      Provider Number: [id number]  Attending Physician Name and Address:  Samuella Cota, MD  Relative Name and Phone Number:  Laveda Norman [phone]    Current Level of Care: Hospital Recommended Level of Care:   Prior Approval Number:    Date Approved/Denied:   PASRR Number: [id number]  Discharge Plan: SNF    Current Diagnoses: Patient Active Problem List   Diagnosis Date Noted   Bilateral pleural effusion 07/22/2022   DNR (do not resuscitate)/DNI(Do Not Intubate) 07/22/2022   Edema due to hypoalbuminemia 07/22/2022   Chronic hypoxic respiratory failure, on home oxygen therapy ([name]) - 4 L/min 07/22/2022   Pressure injury of skin 07/10/2022   Essential hypertension 07/06/2022   Alzheimer disease ([place]) 07/06/2022   Pleural effusion 07/06/2022   History of CVA (cerebrovascular accident) 07/06/2022   Ventral hernia 07/06/2022   Ulcer of lower extremity, limited to breakdown of skin ([place]) 05/30/2022   Urinary frequency 02/14/2022   Need for influenza vaccination 02/07/2022   Anemia due to chronic kidney disease 12/31/2021   Agitation due to dementia ([place]) 12/25/2021   Benign prostatic hyperplasia with urinary obstruction 12/05/2021   Lower extremity edema 12/05/2021   Scrotum swelling 12/05/2021   Elevated homocysteine 11/30/2021   Low magnesium level 11/30/2021   Balanitis 11/30/2021   Chronic atrial fibrillation with RVR ([name]) 07/30/2021   Hypophosphatemia 07/29/2021   Stage 3b chronic kidney disease ([place]) 04/25/2021   Continuous leakage of urine 04/25/2021   At high risk for injury related to fall 04/25/2021   Hypocalcemia  10/30/2020   Vitamin D deficiency 10/30/2020   Edema due to malnutrition ([place]) 10/30/2020   Unsteady gait when walking 09/21/2020   Sarcopenia 09/21/2020   B12 deficiency 01/27/2020   Chronic diastolic CHF (congestive heart failure) ([place]) 03/09/2019   Pain in both feet 11/17/2018   Neuropathy 10/09/2018   Insomnia 10/09/2018   Slow transit constipation 06/18/2018   Iron deficiency anemia 11/25/2017   Decreased appetite 10/16/2017   Hearing difficulty of both ears 10/16/2017   Allergic rhinitis due to pollen 09/03/2017   Ventricular tachycardia ([place]) 01/14/2017   Malnutrition of moderate degree 01/13/2017   DOE (dyspnea on exertion) 01/12/2017   PVD (peripheral vascular disease) ([place]) 08/14/2016   CVA (cerebral infarction) 07/23/2015   Microcytic anemia 07/23/2015   Stroke (cerebrum) ([place]) 07/23/2015   Chronic kidney disease 07/23/2015   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 03/15/2015   PPM-Medtronic 03/13/2010   Type 2 diabetes mellitus without complication, without long-term current use of insulin ([place]) 11/14/2008   Atrial fibrillation ([place]) 11/14/2008   BRADYCARDIA-TACHYCARDIA SYNDROME s/p PPM 11/14/2008    Orientation RESPIRATION BLADDER Height & Weight     Self  O2 Incontinent, External catheter Weight: 151 lb 14.4 oz (68.9 kg) Height:  '5\' 10"'$  (177.8 cm)  BEHAVIORAL SYMPTOMS/MOOD NEUROLOGICAL BOWEL NUTRITION STATUS      Continent Diet (See d/c summary)  AMBULATORY STATUS COMMUNICATION OF NEEDS Skin   Extensive Assist Verbally Normal                       Personal Care Assistance Level of Assistance  Feeding, Bathing, Dressing Bathing Assistance: Maximum  assistance Feeding assistance: Limited assistance Dressing Assistance: Maximum assistance     Functional Limitations Info  Sight, Hearing, Speech Sight Info: Adequate Hearing Info: Adequate Speech Info: Adequate    SPECIAL CARE FACTORS FREQUENCY  PT (By licensed PT), OT (By licensed OT)     PT Frequency:  5x/wk OT Frequency: 5x/wk            Contractures Contractures Info: Not present    Additional Factors Info  Code Status Code Status Info: DNR             Current Medications (07/23/2022):  This is the current hospital active medication list Current Facility-Administered Medications  Medication Dose Route Frequency Provider Last Rate Last Admin   acetaminophen (TYLENOL) tablet 650 mg  650 mg Oral Q6H PRN Kristopher Oppenheim, DO       Or   acetaminophen (TYLENOL) suppository 650 mg  650 mg Rectal Q6H PRN Kristopher Oppenheim, DO       albumin human 25 % solution 25 g  25 g Intravenous Q1200 Kristopher Oppenheim, DO 60 mL/hr at 07/23/22 1315 25 g at 07/23/22 1315   finasteride (PROSCAR) tablet 5 mg  5 mg Oral Daily Kristopher Oppenheim, DO   5 mg at 07/23/22 1023   furosemide (LASIX) injection 20 mg  20 mg Intravenous Q12H Samuella Cota, MD   20 mg at 07/23/22 1023   insulin aspart (novoLOG) injection 0-5 Units  0-5 Units Subcutaneous QHS Kristopher Oppenheim, DO       insulin aspart (novoLOG) injection 0-9 Units  0-9 Units Subcutaneous TID WC Kristopher Oppenheim, DO   2 Units at 07/23/22 1314   latanoprost (XALATAN) 0.005 % ophthalmic solution 1 drop  1 drop Both Eyes QHS Samuella Cota, MD       melatonin tablet 3 mg  3 mg Oral QHS Kristopher Oppenheim, DO   3 mg at 07/22/22 2342   memantine (NAMENDA) tablet 10 mg  10 mg Oral BID Samuella Cota, MD   10 mg at 07/23/22 1023   metoprolol succinate (TOPROL-XL) 24 hr tablet 25 mg  25 mg Oral Daily Kristopher Oppenheim, DO   25 mg at 07/23/22 1023   ondansetron (ZOFRAN) tablet 4 mg  4 mg Oral Q6H PRN Kristopher Oppenheim, DO       Or   ondansetron [facility]) injection 4 mg  4 mg Intravenous Q6H PRN Kristopher Oppenheim, DO       pantoprazole (PROTONIX) EC tablet 40 mg  40 mg Oral Daily Kristopher Oppenheim, DO   40 mg at 07/23/22 1023   tamsulosin (FLOMAX) capsule 0.4 mg  0.4 mg Oral Daily Kristopher Oppenheim, DO   0.4 mg at 07/23/22 1023     Discharge Medications: Please see discharge summary for a list of discharge  medications.  Relevant Imaging Results:  Relevant Lab Results:   Additional Information    Jinger Neighbors, LCSW

## 2022-07-23 NOTE — Progress Notes (Signed)
   07/23/22 2015  Assess: MEWS Score  Temp 98.6 F (37 C)  BP (!) 94/53  MAP (mmHg) 66  Pulse Rate (!) 101  Resp 17  SpO2 100 %  O2 Device Nasal Cannula  O2 Flow Rate (L/min) 3 L/min  Assess: MEWS Score  MEWS Temp 0  MEWS Systolic 1  MEWS Pulse 1  MEWS RR 0  MEWS LOC 0  MEWS Score 2  MEWS Score Color Yellow  Assess: if the MEWS score is Yellow or Red  Were vital signs taken at a resting state? Yes  Focused Assessment Change from prior assessment (see assessment flowsheet)  Does the patient meet 2 or more of the SIRS criteria? No  MEWS guidelines implemented  Yes, yellow  Treat  MEWS Interventions Considered administering scheduled or prn medications/treatments as ordered  Take Vital Signs  Increase Vital Sign Frequency  Yellow: Q2hr x1, continue Q4hrs until patient remains green for 12hrs  Escalate  MEWS: Escalate Yellow: Discuss with charge nurse and consider notifying provider and/or RRT  Notify: Charge Nurse/RN  Name of Charge Nurse/RN Notified [place] RN  Provider Notification  Provider Name/Title Eugenie Norrie, MD  Date Provider Notified 07/23/22  Time Provider Notified 2030  Method of Notification Page  Notification Reason  ([place].)  Provider response See new orders  Date of Provider Response 07/23/22  Time of Provider Response 2046  Assess: SIRS CRITERIA  SIRS Temperature  0  SIRS Pulse 1  SIRS Respirations  0  SIRS WBC 0  SIRS Score Sum  1

## 2022-07-23 NOTE — Evaluation (Signed)
Clinical/Bedside Swallow Evaluation Patient Details  Name: [name] MRN: [id number] Date of Birth: 1923-12-09  Today's Date: 07/23/2022 Time: SLP Start Time (ACUTE ONLY): 56 SLP Stop Time (ACUTE ONLY): 1115 SLP Time Calculation (min) (ACUTE ONLY): 20 min  Past Medical History:  Past Medical History:  Diagnosis Date   Acute CVA (cerebrovascular accident) ([place])    Arthritis    Atrial fibrillation ([place])    CKD (chronic kidney disease)    History of hiatal hernia    Hx SBO    Last 2013 all treated conservatively   Hypertension    Presence of permanent cardiac pacemaker    Prostate cancer ([place])    Stroke ([place]) 2017   left facial drooping noted 08/14/2016   Tachycardia-bradycardia syndrome ([place])    Type II diabetes mellitus ([place])    Ventral hernia    x3   Past Surgical History:  Past Surgical History:  Procedure Laterality Date   ABDOMINAL AORTOGRAM W/LOWER EXTREMITY N/A 08/07/2016   Procedure: Abdominal Aortogram w/Lower Extremity;  Surgeon: Wellington Hampshire, MD;  Location: [facility];  Service: Cardiovascular;  Laterality: N/A;   CHOLECYSTECTOMY OPEN  03/31/2001   Dr Lindon Romp   COLON SURGERY     EXPLORATORY LAPAROTOMY  08/2004   Archie Endo 10/01/2010   HEMORRHOID SURGERY     HERNIA REPAIR     HIATAL HERNIA REPAIR  2002   INCISIONAL HERNIA REPAIR  08/2004   Archie Endo 10/01/2010   INSERT / REPLACE / REMOVE PACEMAKER     PERIPHERAL VASCULAR BALLOON ANGIOPLASTY  08/07/2016   Procedure: Peripheral Vascular Balloon Angioplasty;  Surgeon: Wellington Hampshire, MD;  Location: [facility];  Service: Cardiovascular;;  left popliteal artery   PERIPHERAL VASCULAR INTERVENTION  08/14/2016   popliteal artery/notes 08/14/2016   PERIPHERAL VASCULAR INTERVENTION Left 08/14/2016   Procedure: Peripheral Vascular Intervention;  Surgeon: Wellington Hampshire, MD;  Location: [facility];  Service: Cardiovascular;  Laterality: Left;  popliteal artery   PERMANENT PACEMAKER GENERATOR  CHANGE N/A 04/06/2012   Procedure: PERMANENT PACEMAKER GENERATOR CHANGE;  Surgeon: Evans Lance, MD;  Location: [facility];  Service: Cardiovascular;  Laterality: N/A;   PPM GENERATOR CHANGEOUT N/A 02/04/2022   Procedure: PPM GENERATOR CHANGEOUT;  Surgeon: Evans Lance, MD;  Location: [facility];  Service: Cardiovascular;  Laterality: N/A;   SMALL INTESTINE SURGERY  09/16/2004   SBO resection, VH repair   HPI:  Patient is a CHF, CKD, DM-2, h/o bilateral pleural effusions, chronic hypoxic respiratory failure on 4L oxygen. He presented to the hospital from home (lives with granddaughter and has 24 hour nursing) with SOB and reported hypotension. He was recently hospitalized and underwent left thorocentesis. CXR showed bilateral pleural effusions, left>right with associated bibasilar atelectasis or edema.    Assessment / Plan / Recommendation  Clinical Impression  Patient is not currently presenting with clinical s/s of dysphagia as per this bedside swallow evaluation. He is edentulous at baseline and granddaughter reports he eats foods that are soft and cut up. SLP assessed his swallow with consecutive straw sips of thin liquids. He exhibited timely swallow initiation, good pharyngeal contraction and laryngeal elevation and no overt s/s aspiration, penetration during or after the swallow. SLP recommending mechanical soft solids but that family can select and prepare foods that patient can tolerate. SLP f/u at SNF could be beneficial to maintain appropriate diet consistencies for safety. SLP Visit Diagnosis: Dysphagia, unspecified (R13.10)    Aspiration Risk  No  limitations    Diet Recommendation Other (Comment);Thin liquid;Dysphagia 3 (Mech soft) (Dys 3 (mechanical soft) solids is the safest diet however patient needs to be on strict Heart Healthy and his family and caregivers can select PO items that he can tolerate.)   Liquid Administration via: Cup;Straw Medication Administration:  Crushed with puree Supervision: Patient able to self feed;Full supervision/cueing for compensatory strategies Compensations: Slow rate;Small sips/bites;Follow solids with liquid Postural Changes: Seated upright at 90 degrees    Other  Recommendations Oral Care Recommendations: Oral care BID;Staff/trained caregiver to provide oral care    Recommendations for follow up therapy are one component of a multi-disciplinary discharge planning process, led by the attending physician.  Recommendations may be updated based on patient status, additional functional criteria and insurance authorization.  Follow up Recommendations Skilled nursing-short term rehab (<3 hours/day)      Assistance Recommended at Discharge    Functional Status Assessment Patient has had a recent decline in their functional status and demonstrates the ability to make significant improvements in function in a reasonable and predictable amount of time.  Frequency and Duration   N/A         Prognosis   N/A     Swallow Study   General Date of Onset: 07/23/22 HPI: Patient is a CHF, CKD, DM-2, h/o bilateral pleural effusions, chronic hypoxic respiratory failure on 4L oxygen. He presented to the hospital from home (lives with granddaughter and has 24 hour nursing) with SOB and reported hypotension. He was recently hospitalized and underwent left thorocentesis. CXR showed bilateral pleural effusions, left>right with associated bibasilar atelectasis or edema. Type of Study: Bedside Swallow Evaluation Previous Swallow Assessment: remote BSE in 2017 Diet Prior to this Study: Regular;Thin liquids (Level 0) Temperature Spikes Noted: No Respiratory Status: Nasal cannula History of Recent Intubation: No Behavior/Cognition: Alert;Cooperative;Pleasant mood Oral Cavity Assessment: Within Functional Limits Oral Care Completed by SLP: No Oral Cavity - Dentition: Edentulous Vision: Functional for self-feeding Self-Feeding Abilities:  Able to feed self Patient Positioning: Upright in chair Baseline Vocal Quality: Normal Volitional Cough: Strong Volitional Swallow: Able to elicit    Oral/Motor/Sensory Function Overall Oral Motor/Sensory Function: Within functional limits   Ice Chips     Thin Liquid Thin Liquid: Within functional limits Presentation: Straw;Self Fed    Nectar Thick     Honey Thick     Puree Puree: Not tested   Solid     Solid: Not tested     Sonia Baller, MA, CCC-SLP Speech Therapy

## 2022-07-23 NOTE — TOC Initial Note (Signed)
Transition of Care [facility]) - Initial/Assessment Note    Patient Details  Name: GEONNI STIMPERT MRN: [id number] Date of Birth: [birth year]  Transition of [facility]) CM/SW Contact:    Curlene Labrum, RN Phone Number: 07/23/2022, 3:05 PM  Clinical Narrative:                 CM met with the granddaughter and patient at the bedside to discuss TOC needs.  The patient's granddaughter states that the patient has 24 care are the home through [place] thru the [place] and private pay for hours not covered by the [place].  The granddaughter states that she feels that the care at the home may not bee meeting the patient's needs and patient may need SNF placement.  The patient currently has active home health with [facility] for PT, RN.  RN attends to woundcare at the home for compression stockings application in the home (Unna boots) present to lower extremities bilaterally.  DME at the home includes chronic oxygen at 4 L/min [place], hospital bed, WC, RW and ramp in progress through [place].  The patient's granddaughter states that she called the [facility] this morning and they are aware of patient's admission to the hospital.  Junie Panning, [facility] with [place] is in contact with the daughter to [place] assistance - [phone].  I spoke with Jinger Neighbors, MSW and she will work the patient up for SNF placement at this time.  Expected Discharge Plan: Skilled Nursing Facility Barriers to Discharge: Continued Medical Work up   Patient Goals and CMS Choice Patient states their goals for this hospitalization and ongoing recovery are:: patient's granddaughter would like patient to go to short term rehab CMS Medicare.gov Compare Post Acute Care list provided to:: Patient Represenative (must comment) (patient's granddaughter, [name]) Choice offered to / list presented to : Adult [place] ownership interest in [facility].provided to:: Adult Children    Expected Discharge Plan and Services In-house Referral:  Clinical Social Work Discharge Planning Services: CM Consult Post Acute Care Choice: [place] Living arrangements for the past 2 months: [place]:  (Patient is currently active with PT, RN through [place]) [place]: [place] ([place])        Prior Living Arrangements/Services Living arrangements for the past 2 months: [place] with:: Self (Patient has 24 hour nursing assistant care in the home through [place] pays for [facility]HA 9:30 am to 4:30 pm 5 days a week and family pays out of pocket for 24 hour care not covered by the [place]) Patient language and need for interpreter reviewed:: Yes Do you feel safe going back to the place where you live?: Yes (Granddaughter would prefer STR placement)      Need for Family Participation in Patient Care: Yes (Comment) Care giver support system in place?: Yes (comment) Current home services: DME, Home PT, Home RN (DME at the home includes Hospital bed, Oxygen at 4 L/min [place], RW and WC) Criminal Activity/Legal Involvement Pertinent to Current Situation/Hospitalization: No - Comment as needed  Activities of Daily Living Home Assistive Devices/Equipment: Wheelchair, Environmental consultant (specify type) ADL Screening (condition at time of admission) Patient's cognitive ability adequate to safely complete daily activities?: Yes Is the patient deaf or have difficulty hearing?: No Does the patient have difficulty  seeing, even when wearing glasses/contacts?: No Does the patient have difficulty concentrating, remembering, or making decisions?: No Patient able to express need for assistance with ADLs?: Yes Does the patient have difficulty dressing or bathing?: Yes Independently performs ADLs?: No Communication: Independent Dressing (OT): Needs assistance Is this a change from baseline?: Pre-admission baseline Grooming: Needs assistance Is this a change from  baseline?: Pre-admission baseline Feeding: Needs assistance Bathing: Needs assistance Is this a change from baseline?: Pre-admission baseline Toileting: Needs assistance Is this a change from baseline?: Pre-admission baseline In/Out Bed: Dependent Is this a change from baseline?: Pre-admission baseline Walks in Home: Dependent Is this a change from baseline?: Pre-admission baseline Does the patient have difficulty walking or climbing stairs?: Yes Weakness of Legs: Both Weakness of Arms/Hands: Right  Permission Sought/Granted Permission sought to share information with : Case Manager, Family Supports, Customer service manager Permission granted to share information with : Yes, Verbal Permission Granted     Permission granted to share info w AGENCY: SNF facilities for placement  Permission granted to share info w Relationship: granddaughter - Joycelyn Schmid [id number]     Emotional Assessment Appearance:: Appears stated age Attitude/Demeanor/Rapport: Gracious Affect (typically observed): Accepting Orientation: : Oriented to Self Alcohol / Substance Use: Not Applicable Psych Involvement: No (comment)  Admission diagnosis:  Bilateral pleural effusion [J90] Patient Active Problem List   Diagnosis Date Noted   Bilateral pleural effusion 07/22/2022   DNR (do not resuscitate)/DNI(Do Not Intubate) 07/22/2022   Edema due to hypoalbuminemia 07/22/2022   Chronic hypoxic respiratory failure, on home oxygen therapy ([name]) - 4 L/min 07/22/2022   Pressure injury of skin 07/10/2022   Essential hypertension 07/06/2022   Alzheimer disease ([place]) 07/06/2022   Pleural effusion 07/06/2022   History of CVA (cerebrovascular accident) 07/06/2022   Ventral hernia 07/06/2022   Ulcer of lower extremity, limited to breakdown of skin ([place]) 05/30/2022   Urinary frequency 02/14/2022   Need for influenza vaccination 02/07/2022   Anemia due to chronic kidney disease 12/31/2021   Agitation due to  dementia ([place]) 12/25/2021   Benign prostatic hyperplasia with urinary obstruction 12/05/2021   Lower extremity edema 12/05/2021   Scrotum swelling 12/05/2021   Elevated homocysteine 11/30/2021   Low magnesium level 11/30/2021   Balanitis 11/30/2021   Chronic atrial fibrillation with RVR ([place]) 07/30/2021   Hypophosphatemia 07/29/2021   Stage 3b chronic kidney disease ([place]) 04/25/2021   Continuous leakage of urine 04/25/2021   At high risk for injury related to fall 04/25/2021   Hypocalcemia 10/30/2020   Vitamin D deficiency 10/30/2020   Edema due to malnutrition ([place]) 10/30/2020   Unsteady gait when walking 09/21/2020   Sarcopenia 09/21/2020   B12 deficiency 01/27/2020   Chronic diastolic CHF (congestive heart failure) ([place]) 03/09/2019   Pain in both feet 11/17/2018   Neuropathy 10/09/2018   Insomnia 10/09/2018   Slow transit constipation 06/18/2018   Iron deficiency anemia 11/25/2017   Decreased appetite 10/16/2017   Hearing difficulty of both ears 10/16/2017   Allergic rhinitis due to pollen 09/03/2017   Ventricular tachycardia ([place]) 01/14/2017   Malnutrition of moderate degree 01/13/2017   DOE (dyspnea on exertion) 01/12/2017   PVD (peripheral vascular disease) ([place]) 08/14/2016   CVA (cerebral infarction) 07/23/2015   Microcytic anemia 07/23/2015   Stroke (cerebrum) ([place]) 07/23/2015   Chronic kidney disease 07/23/2015   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 [place]Phone: [phone]: [phone] [place] [address]Phone: [phone]: [phone]    Social Determinants of Health (SDOH) Social History: [place] No Food Insecurity (07/23/2022)  Housing: Low Risk  (07/23/2022)  Transportation Needs: No Transportation Needs (07/23/2022)  Utilities: Not At Risk (07/23/2022)  Depression (PHQ2-9): Low Risk  (07/18/2022)  Financial Resource Strain: Low Risk  (05/14/2022)  Physical Activity: Insufficiently Active (05/14/2022)  Stress: No Stress Concern Present (05/14/2022)  Tobacco Use: Medium Risk (07/23/2022)   SDOH Interventions:     Readmission Risk Interventions    07/08/2022   11:12 AM  Readmission Risk Prevention Plan  Transportation Screening Complete  Medication Review ([place] Complete  PCP or Specialist appointment within 3-5 days of discharge Complete  HRI or HRobertaComplete  SW Recovery Care/Counseling Consult Complete  PButlerNot Applicable

## 2022-07-23 NOTE — Care Management Obs Status (Cosign Needed)
[place] NOTIFICATION   Patient Details  Name: QUINNTEN BITER MRN: [id number] Date of Birth: [birth year]   Medicare Observation Status Notification Given:  Yes    Curlene Labrum, RN 07/23/2022, 4:19 PM

## 2022-07-23 NOTE — Progress Notes (Signed)
Progress Note   Patient: [name] [id number] DOB: 05/25/1923 DOA: 07/22/2022     0 DOS: the patient was seen and examined on 07/23/2022   Brief hospital course: 89 year old man PMH including diastolic CHF, CKD, diabetes mellitus type 2, chronic hypoxic respiratory failure on 4 L presented to the emergency department with shortness of breath, reported hypotension by EMS.  Recently hospitalized and underwent left thoracentesis.  PMH includes bilateral pleural effusions.  Admitted for shortness of breath and treatment for pleural effusion.  Lives at home with granddaughter has 24-hour nursing.  Assessment and Plan: * Bilateral pleural effusion Chronic hypoxic respiratory failure, on home oxygen therapy ([name]) - 4 L/min Last thoracentesis 2/20 on the left.  Sent home on oral Lasix.   Has around-the-clock care at home.   Pleural effusions thought to be secondary from hypoalbuminemia.  Plan for thoracentesis today, tomorrow, albumin.  Lasix x1 today. If improved can likely go home tomorrow.  Close outpatient follow-up.  Hypoalbuminemia, chronic lower extremity edema Some edema probably from hypoalbuminemia. Dietitian consultation  Alzheimer disease ([place]) Appears stable..  Continue Namenda.  Essential hypertension Stable.  Continue Toprol-XL 25 mg  Stage 3b chronic kidney disease ([name]) Consider AKI, consider progression to CKD stage IV Creatinine a bit worse on admission 2.48 with baseline perhaps around 2.0  Trend BMP.  Given pleural effusions, hypoalbuminemia and chronic diastolic CHF will monitor without IV fluids. Given Lasix x2 as above. May be difficult to manage edema and CKD  Chronic diastolic CHF (congestive heart failure) ([name]) Appears stable.  PPM-Medtronic  Type 2 diabetes mellitus without complication, without long-term current use of insulin ([name]) CBG stable.  Continue SSI.  Could consider stopping Actos as it can cause fluid retention  DNR (do not  resuscitate)/DNI(Do Not Intubate)       Subjective:  Feels ok, breathing ok, no complaints except left foot feels cold at times  Physical Exam: Vitals:   07/23/22 0431 07/23/22 0635 07/23/22 0740 07/23/22 0741  BP: 112/70  106/75 106/75  Pulse: 96  94 (!) 104  Resp: 16     Temp: 98.7 F (37.1 C)  97.7 F (36.5 C) 97.7 F (36.5 C)  TempSrc: Oral  Oral Oral  SpO2: 94%  97% 97%  Weight:  68.9 kg    Height:  '5\' 10"'$  (1.778 m)     Physical Exam Vitals reviewed.  Constitutional:      General: He is not in acute distress.    Appearance: He is not ill-appearing or toxic-appearing.  Cardiovascular:     Rate and Rhythm: Normal rate and regular rhythm.     Heart sounds: No murmur heard. Pulmonary:     Effort: Pulmonary effort is normal. No respiratory distress.     Breath sounds: No wheezing, rhonchi or rales.  Musculoskeletal:     Right lower leg: No edema.     Left lower leg: No edema.     Comments: BLE with unna boot wraps Left distal foot and toes appear normal with normal perfusion  Neurological:     Mental Status: He is alert.  Psychiatric:        Mood and Affect: Mood normal.        Behavior: Behavior normal.     Data Reviewed: CBG stable Creatine was 2.48 on admission, repeat pending Troponins mildly elevated 21, 23 Lactic acid mildly elevated 2.1, stable Hgb stable 9.0 CXR bilateral pleural effusions  Family Communication:   Disposition: Status is: Observation   Planned Discharge  Destination: Home    Time spent: 35 minutes  Author: Murray Hodgkins, MD 07/23/2022 9:16 AM  For on call review [email or web address].

## 2022-07-23 NOTE — Plan of Care (Signed)

## 2022-07-24 ENCOUNTER — Inpatient Hospital Stay (HOSPITAL_COMMUNITY): Payer: No Typology Code available for payment source

## 2022-07-24 DIAGNOSIS — J90 Pleural effusion, not elsewhere classified: Secondary | ICD-10-CM | POA: Diagnosis not present

## 2022-07-24 HISTORY — PX: IR THORACENTESIS ASP PLEURAL SPACE W/IMG GUIDE: IMG5380

## 2022-07-24 LAB — GLUCOSE, CAPILLARY
Glucose-Capillary: 94 mg/dL (ref 70–99)
Glucose-Capillary: 121 mg/dL — ABNORMAL HIGH (ref 70–99)
Glucose-Capillary: 210 mg/dL — ABNORMAL HIGH (ref 70–99)
Glucose-Capillary: 153 mg/dL — ABNORMAL HIGH (ref 70–99)

## 2022-07-24 LAB — GRAM STAIN

## 2022-07-24 LAB — LACTATE DEHYDROGENASE: LDH: 144 U/L (ref 98–192)

## 2022-07-24 LAB — LACTATE DEHYDROGENASE, PLEURAL OR PERITONEAL FLUID: LD, Fluid: 122 U/L — ABNORMAL HIGH (ref 3–23)

## 2022-07-24 LAB — BODY FLUID CELL COUNT WITH DIFFERENTIAL
Eos, Fluid: 0 %
Lymphs, Fluid: 31 %
Monocyte-Macrophage-Serous Fluid: 12 % — ABNORMAL LOW (ref 50–90)
Neutrophil Count, Fluid: 57 % — ABNORMAL HIGH (ref 0–25)
Total Nucleated Cell Count, Fluid: 490 cu mm (ref 0–1000)

## 2022-07-24 LAB — PROTEIN, PLEURAL OR PERITONEAL FLUID: Total protein, fluid: <3 g/dL

## 2022-07-24 MED ORDER — ADULT MULTIVITAMIN W/MINERALS CH
1.0000 | ORAL_TABLET | Freq: Every day | ORAL | Status: DC
  Administered 2022-07-24 – 2022-07-29 (×6): 1 via ORAL
  Filled 2022-07-24 (×6): qty 1

## 2022-07-24 MED ORDER — LIDOCAINE HCL 1 % IJ SOLN
INTRAMUSCULAR | Status: AC
  Filled 2022-07-24: qty 20

## 2022-07-24 MED ORDER — GLUCERNA SHAKE PO LIQD
237.0000 mL | Freq: Three times a day (TID) | ORAL | Status: DC
  Administered 2022-07-24 – 2022-07-27 (×10): 237 mL via ORAL

## 2022-07-24 NOTE — Procedures (Signed)
PROCEDURE SUMMARY:  Successful US guided left thoracentesis. Yielded 600 mL of yellow fluid. Pt tolerated procedure well. No immediate complications.  Specimen was sent for labs. CXR ordered.  EBL < 5 mL  Docia Barrier PA-C 07/24/2022 4:10 PM

## 2022-07-24 NOTE — Progress Notes (Signed)
PROGRESS NOTE    [name]  [id number] DOB: 19-Jul-1923 DOA: 07/22/2022 PCP: [name], MD  Chief Complaint  Patient presents with   Shortness of Breath    Brief Narrative:   89 year old man PMH including diastolic CHF, CKD, diabetes mellitus type 2, chronic hypoxic respiratory failure on 4 L presented to the emergency department with shortness of breath, reported hypotension by EMS.  Recently hospitalized and underwent left thoracentesis.  PMH includes bilateral pleural effusions.  Admitted for shortness of breath and treatment for pleural effusion.  Lives at home with granddaughter has 24-hour nursing.    Assessment & Plan:   Principal Problem:   Bilateral pleural effusion Active Problems:   Edema due to hypoalbuminemia   Type 2 diabetes mellitus without complication, without long-term current use of insulin ([name])   PPM-Medtronic   Chronic diastolic CHF (congestive heart failure) ([name])   Stage 3b chronic kidney disease ([name])   Essential hypertension   Alzheimer disease ([place])   DNR (do not resuscitate)/DNI(Do Not Intubate)   Chronic hypoxic respiratory failure, on home oxygen therapy ([name]) - 4 L/min  Heart Failure Exacerbation Bilateral pleural effusion Chronic hypoxic respiratory failure, on home oxygen therapy ([name]) - 4 L/min Hypoalbuminemia  - currently on 3 L by [place] Last thoracentesis 2/20 on the left.  Appears transudative by lights.  Cytology without malignant cells.  Sent home on oral Lasix 20 mg daily.   S/p L thoracentesis with 1.2 L of clear yellow fluid removed on 3/5 (no labs sent) Pending R thoracentesis today (will repeat thora labs) Suspect bilateral effusions related to diastolic HF - he does have low albumin as well which likely contributing S/p lasix 20 mg x2 Will consult renal with his soft blood pressures and baseline CKD, I think he might benefit from more aggressive diuresis in the outpatient setting (and here after his  thoracenteses are complete)   AKI on Stage 3b chronic kidney disease ([name]) Creatinine Kellon Chalk bit worse on admission 2.48 with baseline perhaps around 1.8 - 2.0  S/p lasix.  Albumin May be difficult to manage edema and CKD - will ask for renal assistance  Pericardial Effusion - noted 2/18 echo, moderate pericardial effusion, larger than 12/2021 effusion - follow with diuresis  Alzheimer disease ([place]) Appears stable..  Continue Namenda.   Essential hypertension Stable.  Continue Toprol-XL 25 mg   Atrial Fibrillation - xarelto on hold, will transition to eliquis given his CKD   PPM-Medtronic   Type 2 diabetes mellitus without complication, without long-term current use of insulin ([name]) CBG stable.  Continue SSI.  will recommend stopping actos at discharge, since will plan for d/c to SNF, will likely d/c with SSI   DNR (do not resuscitate)/DNI(Do Not Intubate)     DVT prophylaxis: xarelto on hold pending thora Code Status: DNR Family Communication: family friend at bedside Disposition:   Status is: Inpatient Remains inpatient appropriate because: pending thoracentesis and additional [place] conversations   Consultants:  IR  Procedures:  Left thoracentesis IMPRESSION: Successful ultrasound guided left thoracentesis yielding 1.2 L of pleural fluid.  Antimicrobials:  Anti-infectives (From admission, onward)    None       Subjective: Asks me for Gean Laursen coffee when I wake him up  Objective: Vitals:   07/23/22 2015 07/23/22 2353 07/24/22 0402 07/24/22 0815  BP: (!) 94/53 (!) 79/57 (!) 83/55 121/86  Pulse: (!) 101 81 85 (!) 105  Resp: '17  17 19  '$ Temp: 98.6 F (37 C)  97.8  F (36.6 C) 98.3 F (36.8 C)  TempSrc:   Oral Oral  SpO2: 100%  100% 100%  Weight:      Height:        Intake/Output Summary (Last 24 hours) at 07/24/2022 1528 Last data filed at 07/24/2022 0407 Gross per 24 hour  Intake --  Output 500 ml  Net -500 ml   Filed Weights   07/23/22 0635  Weight: 68.9  kg    Examination:  General exam: Appears calm and comfortable  Respiratory system: unlabored Cardiovascular system: RRR Central nervous system: Alert, sleepy. No focal neurological deficits. Extremities: no LEE   Data Reviewed: I have personally reviewed following labs and imaging studies  CBC: Recent Labs  Lab 07/22/22 1557 07/23/22 0945  WBC 8.6 6.5  NEUTROABS 6.2 4.7  HGB 9.0* 9.8*  HCT 27.4* 28.5*  MCV 77.8* 74.4*  PLT 182 [id number]    Basic Metabolic Panel: Recent Labs  Lab 07/22/22 1557 07/23/22 0945  NA 137 138  K 4.6 4.6  CL 105 105  CO2 22 24  GLUCOSE 158* 132*  BUN 32* 34*  CREATININE 2.48* 2.54*  CALCIUM 7.4* 8.0*  MG  --  2.0    GFR: Estimated Creatinine Clearance: 16.6 mL/min (Cheryln Balcom) (by C-G formula based on SCr of 2.54 mg/dL (H)).  Liver Function Tests: Recent Labs  Lab 07/22/22 1557 07/23/22 0945  AST 17 14*  ALT 8 8  ALKPHOS 47 58  BILITOT 0.3 0.7  PROT 5.2* 5.8*  ALBUMIN 2.4* 2.7*    CBG: Recent Labs  Lab 07/23/22 1252 07/23/22 1701 07/23/22 2105 07/24/22 0816 07/24/22 1228  GLUCAP 177* 126* 135* 94 121*     Recent Results (from the past 240 hour(s))  Resp panel by RT-PCR (RSV, Flu Keeshia Sanderlin&B, Covid) Anterior Nasal Swab     Status: None   Collection Time: 07/22/22  3:58 PM   Specimen: Anterior Nasal Swab  Result Value Ref Range Status   SARS Coronavirus 2 by RT PCR NEGATIVE NEGATIVE Final   Influenza Sophiana Milanese by PCR NEGATIVE NEGATIVE Final   Influenza B by PCR NEGATIVE NEGATIVE Final    Comment: (NOTE) The Xpert Xpress SARS-CoV-2/FLU/RSV plus assay is intended as an aid in the diagnosis of influenza from Nasopharyngeal swab specimens and should not be used as Elvena Oyer sole basis for treatment. Nasal washings and aspirates are unacceptable for Xpert Xpress SARS-CoV-2/FLU/RSV testing.  Fact Sheet for Patients: EntrepreneurPulse.com.au  Fact Sheet for Healthcare Providers: IncredibleEmployment.be  This test  is not yet approved or cleared by the [place] FDA and has been authorized for detection and/or diagnosis of SARS-CoV-2 by FDA under an Emergency Use Authorization (EUA). This EUA will remain in effect (meaning this test can be used) for the duration of the COVID-19 declaration under Section 564(b)(1) of the Act, 21 U.S.C. section 360bbb-3(b)(1), unless the authorization is terminated or revoked.     Resp Syncytial Virus by PCR NEGATIVE NEGATIVE Final    Comment: (NOTE) Fact Sheet for Patients: EntrepreneurPulse.com.au  Fact Sheet for Healthcare Providers: IncredibleEmployment.be  This test is not yet approved or cleared by the [place] FDA and has been authorized for detection and/or diagnosis of SARS-CoV-2 by FDA under an Emergency Use Authorization (EUA). This EUA will remain in effect (meaning this test can be used) for the duration of the COVID-19 declaration under Section 564(b)(1) of the Act, 21 U.S.C. section 360bbb-3(b)(1), unless the authorization is terminated or revoked.  Performed at [facility], [place]  [address] [place], [place] [address]          Radiology Studies: IR THORACENTESIS ASP PLEURAL SPACE W/IMG GUIDE  Result Date: 07/23/2022 INDICATION: Shortness of breath. Left-sided pleural effusion. Request for therapeutic thoracentesis. EXAM: ULTRASOUND GUIDED LEFT THORACENTESIS MEDICATIONS: 1% plain lidocaine, 5 mL COMPLICATIONS: None immediate. PROCEDURE: An ultrasound guided thoracentesis was thoroughly discussed with the patient and questions answered. The benefits, risks, alternatives and complications were also discussed. The patient understands and wishes to proceed with the procedure. Written consent was obtained. Ultrasound was performed to localize and mark an adequate pocket of fluid in the left chest. The area was then prepped and draped in the normal sterile fashion. 1% Lidocaine was used for local  anesthesia. Under ultrasound guidance Velva Molinari 6 Fr Safe-T-Centesis catheter was introduced. Thoracentesis was performed. The catheter was removed and Kiora Hallberg dressing applied. FINDINGS: Quinci Gavidia total of approximately 1.2 L of clear yellow fluid was removed. IMPRESSION: Successful ultrasound guided left thoracentesis yielding 1.2 L of pleural fluid. Read by: Ascencion Dike PA-C Electronically Signed   By: Ruthann Cancer M.D.   On: 07/23/2022 12:44   DG Chest 1 View  Result Date: 07/23/2022 CLINICAL DATA:  Left-sided thoracentesis. EXAM: CHEST  1 VIEW COMPARISON:  Chest x-ray July 22, 2022. FINDINGS: Decreased layering left pleural effusion and similar right pleural effusion. No visible pneumothorax on this semi erect radiograph. Similar overlying bibasilar opacities. Similar enlarged cardiac silhouette. Similar position of left subclavian approach dual lead cardiac rhythm maintenance device. No acute osseous abnormality. IMPRESSION: 1. Decreased layering left pleural effusion and similar right pleural effusion. No visible pneumothorax on this semi erect radiograph. 2. Similar overlying bibasilar opacities, potentially atelectasis. Electronically Signed   By: Margaretha [place] M.D.   On: 07/23/2022 12:22   DG Chest 2 View  Result Date: 07/22/2022 CLINICAL DATA:  Dyspnea. EXAM: CHEST - 2 VIEW COMPARISON:  July 09, 2022. FINDINGS: Stable cardiomegaly. Left-sided pacemaker is unchanged in position. Bilateral pleural effusions are noted, left greater than right, with associated bibasilar atelectasis or edema. Bony thorax is unremarkable. IMPRESSION: Bilateral pleural effusions are noted, left greater than right, with associated bibasilar atelectasis or edema. Electronically Signed   By: Marijo Conception M.D.   On: 07/22/2022 16:08        Scheduled Meds:  feeding supplement (GLUCERNA SHAKE)  237 mL Oral TID BM   finasteride  5 mg Oral Daily   insulin aspart  0-5 Units Subcutaneous QHS   insulin aspart  0-9 Units  Subcutaneous TID WC   latanoprost  1 drop Both Eyes QHS   melatonin  3 mg Oral QHS   memantine  10 mg Oral BID   metoprolol succinate  25 mg Oral Daily   multivitamin with minerals  1 tablet Oral Daily   pantoprazole  40 mg Oral Daily   tamsulosin  0.4 mg Oral Daily   Continuous Infusions:   LOS: 1 day    Time spent: over 30 min    Fayrene Helper, MD [facility]   To contact the attending provider between 7A-7P or the covering provider during after hours 7P-7A, please log into the web site [email or web address] and access using universal [place] password for that web site. If you do not have the password, please call the hospital operator.  07/24/2022, 3:28 PM

## 2022-07-24 NOTE — Plan of Care (Signed)

## 2022-07-24 NOTE — Progress Notes (Signed)
Initial Nutrition Assessment  DOCUMENTATION CODES:   Not applicable  INTERVENTION:  - Liberalize to Regular diet, 1800 mL fluid.   - Add Glucerna Shake po TID, each supplement provides 220 kcal and 10 grams of protein   - Add MVI q day.   NUTRITION DIAGNOSIS:   Increased nutrient needs related to wound healing as evidenced by estimated needs.  GOAL:   Patient will meet greater than or equal to 90% of their needs  MONITOR:   PO intake, Supplement acceptance  REASON FOR ASSESSMENT:   Consult Assessment of nutrition requirement/status  ASSESSMENT:   89 y.o. male admits related to hypoxia. PMH includes: CVA, afib, CKD, HTN, prostate cancer, T2DM. Pt is currently receiving medical management related to bilateral pleural effusion.  Meds reviewed: sliding scale insulin. Labs reviewed: BUN/creatinine high. Blood sugars well controlled.   The pt was very hard of hearing and unable to provide nutrition hx details. No intakes noted in record. Pt with a stage 1 pressure injury. RD will modify diet to Regular diet, 1800 mL fluid restriction. RD will also add Glucerna TID for now. Will continue to monitor PO intakes.  NUTRITION - FOCUSED PHYSICAL EXAM:  Unable to assess - attempt at follow up.   Diet Order:   Diet Order             Diet heart healthy/carb modified Room service appropriate? Yes; Fluid consistency: Thin; Fluid restriction: 1800 mL Fluid  Diet effective now                   EDUCATION NEEDS:   Not appropriate for education at this time  Skin:  Skin Assessment: Skin Integrity Issues: Skin Integrity Issues:: Stage I Stage I: buttocks  Last BM:  PTA  Height:   Ht Readings from Last 1 Encounters:  07/23/22 '5\' 10"'$  (1.778 m)    Weight:   Wt Readings from Last 1 Encounters:  07/23/22 68.9 kg    Ideal Body Weight:     BMI:  Body mass index is 21.79 kg/m.  Estimated Nutritional Needs:   Kcal:  [id number] kcals  Protein:  100-120 gm  Fluid:   >/= 2 L  Thalia Bloodgood, RD, LDN, CNSC.

## 2022-07-24 NOTE — TOC Progression Note (Signed)
Transition of Care [facility]) - Progression Note    Patient Details  Name: [name] MRN: [id number] Date of Birth: 05-23-1923  Transition of [facility]) CM/SW Contact  Jinger Neighbors, [place] Phone Number: 07/24/2022, 11:43 AM  Clinical Narrative:     CSW reviewed bed offer and pt was accepted by [facility]. CSW called [employer] for auth for SNF.    Expected Discharge Plan: [place] Barriers to Discharge: Continued Medical Work up  Expected Discharge Plan and Services In-house Referral: Clinical Social Work Discharge Planning Services: CM Consult Post Acute Care Choice: [place] Living arrangements for the past 2 months: [place]:  (Patient is currently active with PT, RN through [place]) [place]: [place] ([facility])         Social Determinants of Health (SDOH) Interventions [place]: No Food Insecurity (07/23/2022)  Housing: Low Risk  (07/23/2022)  Transportation Needs: No Transportation Needs (07/23/2022)  Utilities: Not At Risk (07/23/2022)  Depression (PHQ2-9): Low Risk  (07/18/2022)  Financial Resource Strain: Low Risk  (05/14/2022)  Physical Activity: Insufficiently Active (05/14/2022)  Stress: No Stress Concern Present (05/14/2022)  Tobacco Use: Medium Risk (07/23/2022)    Readmission Risk Interventions    07/08/2022   11:12 AM  Readmission Risk Prevention Plan  Transportation Screening Complete  Medication Review ([place]) Complete  PCP or Specialist appointment within 3-5 days of discharge Complete  HRI or [place] Complete  SW Recovery Care/Counseling Consult Complete  [place] Not Applicable

## 2022-07-25 ENCOUNTER — Inpatient Hospital Stay (HOSPITAL_COMMUNITY): Payer: No Typology Code available for payment source

## 2022-07-25 DIAGNOSIS — J90 Pleural effusion, not elsewhere classified: Secondary | ICD-10-CM | POA: Diagnosis not present

## 2022-07-25 HISTORY — PX: IR THORACENTESIS ASP PLEURAL SPACE W/IMG GUIDE: IMG5380

## 2022-07-25 LAB — GLUCOSE, CAPILLARY
Glucose-Capillary: 120 mg/dL — ABNORMAL HIGH (ref 70–99)
Glucose-Capillary: 188 mg/dL — ABNORMAL HIGH (ref 70–99)
Glucose-Capillary: 158 mg/dL — ABNORMAL HIGH (ref 70–99)

## 2022-07-25 LAB — CBC WITH DIFFERENTIAL/PLATELET
Abs Immature Granulocytes: 0.04 10*3/uL (ref 0.00–0.07)
Basophils Absolute: 0 10*3/uL (ref 0.0–0.1)
Basophils Relative: 0 %
Eosinophils Absolute: 0.1 10*3/uL (ref 0.0–0.5)
Eosinophils Relative: 1 %
HCT: 27.6 % — ABNORMAL LOW (ref 39.0–52.0)
Hemoglobin: 9.1 g/dL — ABNORMAL LOW (ref 13.0–17.0)
Immature Granulocytes: 1 %
Lymphocytes Relative: 16 %
Lymphs Abs: 1 10*3/uL (ref 0.7–4.0)
MCH: 24.9 pg — ABNORMAL LOW (ref 26.0–34.0)
MCHC: 33 g/dL (ref 30.0–36.0)
MCV: 75.6 fL — ABNORMAL LOW (ref 80.0–100.0)
Monocytes Absolute: 0.7 10*3/uL (ref 0.1–1.0)
Monocytes Relative: 12 %
Neutro Abs: 4.5 10*3/uL (ref 1.7–7.7)
Neutrophils Relative %: 70 %
Platelets: 164 10*3/uL (ref 150–400)
RBC: 3.65 MIL/uL — ABNORMAL LOW (ref 4.22–5.81)
RDW: 17.2 % — ABNORMAL HIGH (ref 11.5–15.5)
WBC: 6.3 10*3/uL (ref 4.0–10.5)
nRBC: 0 % (ref 0.0–0.2)

## 2022-07-25 LAB — COMPREHENSIVE METABOLIC PANEL
ALT: 9 U/L (ref 0–44)
AST: 14 U/L — ABNORMAL LOW (ref 15–41)
Albumin: 2.7 g/dL — ABNORMAL LOW (ref 3.5–5.0)
Alkaline Phosphatase: 44 U/L (ref 38–126)
Anion gap: 7 (ref 5–15)
BUN: 31 mg/dL — ABNORMAL HIGH (ref 8–23)
CO2: 23 mmol/L (ref 22–32)
Calcium: 8 mg/dL — ABNORMAL LOW (ref 8.9–10.3)
Chloride: 108 mmol/L (ref 98–111)
Creatinine, Ser: 2.22 mg/dL — ABNORMAL HIGH (ref 0.61–1.24)
GFR, Estimated: 26 mL/min — ABNORMAL LOW (ref >60)
Glucose, Bld: 115 mg/dL — ABNORMAL HIGH (ref 70–99)
Potassium: 4.5 mmol/L (ref 3.5–5.1)
Sodium: 138 mmol/L (ref 135–145)
Total Bilirubin: 0.6 mg/dL (ref 0.3–1.2)
Total Protein: 5.2 g/dL — ABNORMAL LOW (ref 6.5–8.1)

## 2022-07-25 LAB — PROTEIN, PLEURAL OR PERITONEAL FLUID: Total protein, fluid: <3 g/dL

## 2022-07-25 LAB — BODY FLUID CELL COUNT WITH DIFFERENTIAL
Eos, Fluid: 0 %
Lymphs, Fluid: 63 %
Monocyte-Macrophage-Serous Fluid: 27 % — ABNORMAL LOW (ref 50–90)
Neutrophil Count, Fluid: 10 % (ref 0–25)
Total Nucleated Cell Count, Fluid: 292 cu mm (ref 0–1000)

## 2022-07-25 LAB — LACTATE DEHYDROGENASE, PLEURAL OR PERITONEAL FLUID: LD, Fluid: 54 U/L — ABNORMAL HIGH (ref 3–23)

## 2022-07-25 LAB — MAGNESIUM: Magnesium: 2 mg/dL (ref 1.7–2.4)

## 2022-07-25 LAB — BRAIN NATRIURETIC PEPTIDE: B Natriuretic Peptide: 535.3 pg/mL — ABNORMAL HIGH (ref 0.0–100.0)

## 2022-07-25 LAB — PHOSPHORUS: Phosphorus: 3.3 mg/dL (ref 2.5–4.6)

## 2022-07-25 MED ORDER — LIDOCAINE HCL 1 % IJ SOLN
INTRAMUSCULAR | Status: AC
  Filled 2022-07-25: qty 20

## 2022-07-25 MED ORDER — APIXABAN 2.5 MG PO TABS
2.5000 mg | ORAL_TABLET | Freq: Two times a day (BID) | ORAL | Status: DC
  Administered 2022-07-26 – 2022-07-29 (×7): 2.5 mg via ORAL
  Filled 2022-07-25 (×7): qty 1

## 2022-07-25 NOTE — Evaluation (Signed)
Occupational Therapy Evaluation Patient Details Name: [name] MRN: [id number] DOB: [birth year] Today's Date: 07/25/2022   History of Present Illness 89 year old male history of diastolic heart failure, A-fib/tachybradycardia syndrome status post permanent pacemaker, CKD stage IIIb/IV, diabetes, dementia, known bilateral pleural effusions, chronic hypoxic respite failure on 4 L of oxygen presents to ER today with worsening shortness of breath and reported hypotension.  Pt was admitted with bilateral pleural effusion.   Clinical Impression   Pt was admitted as above, presenting wtih deficits as listed below (please refer to OT problem list for details). Pt lives with his grand daughter in 1 story home. Per chart review, he has 24/7 care and RN. [facility]. Pt is currently Min A for bed mobility, Min A for sit to stand transfers (x2) from EOB using RW, and Min A to SPT to 3:1. Pt requires assist for ADL's at baseline level. Pt should benefit from acute OT to assist with maximizing independence with ADL's prior to d/c to SNF rehab.      Recommendations for follow up therapy are one component of a multi-disciplinary discharge planning process, led by the attending physician.  Recommendations may be updated based on patient status, additional functional criteria and insurance authorization.   Follow Up Recommendations  Skilled nursing-short term rehab (<3 hours/day)     Assistance Recommended at Discharge Frequent or constant Supervision/Assistance  Patient can return home with the following A little help with walking and/or transfers;A lot of help with bathing/dressing/bathroom;Assistance with cooking/housework;Direct supervision/assist for medications management;Assist for transportation;Help with stairs or ramp for entrance    Functional Status Assessment  Patient has had a recent decline in their functional status and demonstrates the ability to make significant  improvements in function in a reasonable and predictable amount of time.  Equipment Recommendations  Other (comment) (Defer to next venue)    Recommendations for Other Services       Precautions / Restrictions Precautions Precautions: Fall Precaution Comments: DNR Restrictions Weight Bearing Restrictions: No      Mobility Bed Mobility Overal bed mobility: Needs Assistance Bed Mobility: Supine to Sit     Supine to sit: Min assist, HOB elevated     General bed mobility comments: Increased time and Min A for trunk. After sitting, pt able to scoot to EOB "To get my feet on the floor"    Transfers Overall transfer level: Needs assistance Equipment used: Rolling walker (2 wheels) Transfers: Sit to/from Stand, Bed to chair/wheelchair/BSC Sit to Stand: Min assist, From elevated surface Stand pivot transfers: Min assist   General transfer comment: Increased time for transfers with vc's and tc's for safety and sequencing with RW/hand placement. Pt is HOH      Balance Overall balance assessment: Needs assistance Sitting-balance support: Bilateral upper extremity supported, Single extremity supported, Feet supported Sitting balance-Leahy Scale: Fair     Standing balance support: Bilateral upper extremity supported, Reliant on assistive device for balance Standing balance-Leahy Scale: Poor Standing balance comment: Pt with posterior lean noted with initial static stand from EOB. VC's and tc's for hand placement and safety/sequencing     ADL either performed or assessed with clinical judgement   ADL Overall ADL's : Needs assistance/impaired Eating/Feeding: Set up;Minimal assistance;Bed level Eating/Feeding Details (indicate cue type and reason): HOB elevated Grooming: Set up;Sitting   Upper Body Bathing: Minimal assistance;Sitting   Lower Body Bathing: Sitting/lateral leans;Sit to/from stand;Moderate assistance   Upper Body Dressing : Minimal assistance;Sitting   Lower  Body Dressing: Sitting/lateral leans;Moderate assistance;Maximal assistance;Sit to/from stand Lower Body Dressing Details (indicate cue type and reason): Donning socks Toilet Transfer: Minimal assistance;BSC/3in1;Rolling walker (2 wheels) Toilet Transfer Details (indicate cue type and reason): SPT to 3:1 for BM Toileting- Clothing Manipulation and Hygiene: Maximal assistance;Sitting/lateral lean;Sit to/from stand   Functional mobility during ADLs: Minimal assistance;Rolling walker (2 wheels) General ADL Comments: Pt lives with his grand daughter in 1 story home. Per chart review, he has 24/7 care and RN. [facility] is in process of building a ramp. Pt is currently Min A for bed mobility, Min A for sit to stand transfers (x2) from EOB using RW, and Min A to SPT to 3:1. Pt requires assist for ADL's at baseline level. Pt should benefit from acute OT to assist with maximizing independence with ADL's prior to d/c to SNF rehab.     Vision Ability to See in Adequate Light: 0 Adequate Patient Visual Report: No change from baseline              Pertinent Vitals/Pain Pain Assessment Pain Assessment: Faces Faces Pain Scale: No hurt     Hand Dominance Right   Extremity/Trunk Assessment Upper Extremity Assessment Upper Extremity Assessment: Overall WFL for tasks assessed   Lower Extremity Assessment Lower Extremity Assessment: Defer to PT evaluation    Communication Communication Communication: HOH   Cognition Arousal/Alertness: Awake/alert Behavior During Therapy: WFL for tasks assessed/performed Overall Cognitive Status: History of cognitive impairments - at baseline   General Comments: Pt with h/o dementia at baseline, pt was A+O x3 to person, place [facility] hospital, oriented to [facility] hospital), time/year but not situation     General Comments  Pt had removed O2 [place] while eating prior to therapy. OT assisted in reapplying 3L O2 via [place] expects to be  discharged to:: [place]: Other (Comment) (Lives with [place] daughter) Available Help at Discharge: Personal care attendant;Available 24 hours/day Type of Home: House Home Access: Stairs to enter [employer] of Steps: 2 (Per chart review, VA is in process of building a ramp) Entrance Stairs-Rails: Right;Left Home Layout: One level   Bathroom Shower/Tub: Teacher, early years/pre: Standard   Home Equipment: Cane - single Barista (2 wheels)   Additional Comments: Information taken from previous admission and chart review. Patient lives at home with his granddaughter.  Per chart, Granddaughter states that patient has 24-hour care with nursing. Pt is HOH and has some confusion at times.      Prior Functioning/Environment Prior Level of Function : Needs assist   Mobility Comments: Per chart review, pt has RW, w/c, hospital bed and ramp is being built by [place]. ADLs Comments: Pt requires assist for ADL's at baseline level, has 24 hour care with nursing        OT Problem List: Impaired balance (sitting and/or standing);Decreased cognition;Decreased safety awareness;Decreased activity tolerance;Decreased knowledge of use of DME or AE;Other (comment) (bilateral LE edema)      OT Treatment/Interventions: Self-care/ADL training;Patient/family education;Therapeutic activities;DME and/or AE instruction;Cognitive remediation/compensation;Energy conservation    OT Goals(Current goals can be found in the care plan section) Acute Rehab OT Goals Patient Stated Goal: I have to use the bathroom OT Goal Formulation: Patient unable to participate in goal setting Time For Goal Achievement: 08/08/22 Potential to Achieve Goals: Good  OT Frequency: Min 2X/week       AM-PAC OT "6 Clicks" Daily Activity  Outcome Measure Help from another person eating meals?: A Little Help from another person taking care of personal grooming?: A  Little Help from another person toileting, which includes using toliet, bedpan, or urinal?: A Lot Help from another person bathing (including washing, rinsing, drying)?: A Lot Help from another person to put on and taking off regular upper body clothing?: A Little Help from another person to put on and taking off regular lower body clothing?: Total 6 Click Score: 14   End of Session Equipment Utilized During Treatment: Gait belt;Rolling walker (2 wheels);Oxygen Nurse Communication: Other (comment);Mobility status (Pt needing 3:1, BM)  Activity Tolerance: Patient tolerated treatment well;Other (comment) (HOH) Patient left: Other (comment) (on 3:1 with PT)  OT Visit Diagnosis: Unsteadiness on feet (R26.81);Muscle weakness (generalized) (M62.81)                Time: 1038-1100 OT Time Calculation (min): 22 min Charges:  OT General Charges $OT Visit: 1 Visit OT Evaluation $OT Eval Low Complexity: 1 Low  Mikeyla Music Beth Dixon, OTR/L 07/25/2022, 11:31 AM

## 2022-07-25 NOTE — Progress Notes (Signed)
Spoke to Cm with [employer] who reports they will need to send auth request for SNF to their medical director unless therapy notes available. Auth request has been withdrawn, will resubmit auth for [facility] one therapy notes available.   Wandra Feinstein, MSW, LCSW [phone] (coverage)

## 2022-07-25 NOTE — Evaluation (Signed)
Physical Therapy Evaluation Patient Details Name: [name] MRN: [id number] DOB: 1923/11/12 Today's Date: 07/25/2022  History of Present Illness  89 year old male history of diastolic heart failure, A-fib/tachybradycardia syndrome status post permanent pacemaker, CKD stage IIIb/IV, diabetes, dementia, known bilateral pleural effusions, chronic hypoxic respite failure on 4 L of oxygen presents to ER today with worsening shortness of breath and reported hypotension.  Pt was admitted with bilateral pleural effusion.  Clinical Impression   Pt presents with generalized weakness, impaired balance, decreased activity tolerance. Pt to benefit from acute PT to address deficits. Pt ambulated hallway distance with use of RW and light steadying assist. Per chart review, pt and family open to ST-SNF, recommended below. PT to progress mobility as tolerated, and will continue to follow acutely.         Recommendations for follow up therapy are one component of a multi-disciplinary discharge planning process, led by the attending physician.  Recommendations may be updated based on patient status, additional functional criteria and insurance authorization.  Follow Up Recommendations Skilled nursing-short term rehab (<3 hours/day)      Assistance Recommended at Discharge Frequent or constant Supervision/Assistance  Patient can return home with the following  A little help with walking and/or transfers;A little help with bathing/dressing/bathroom    Equipment Recommendations None recommended by PT  Recommendations for Other Services       Functional Status Assessment Patient has had a recent decline in their functional status and demonstrates the ability to make significant improvements in function in a reasonable and predictable amount of time.     Precautions / Restrictions Precautions Precautions: Fall Precaution Comments: DNR Restrictions Weight Bearing Restrictions: No      Mobility   Bed Mobility Overal bed mobility: Needs Assistance Bed Mobility: Supine to Sit     Supine to sit: Min assist, HOB elevated     General bed mobility comments: Increased time and Min A for trunk. After sitting, pt able to scoot to EOB "To get my feet on the floor"    Transfers Overall transfer level: Needs assistance Equipment used: Rolling walker (2 wheels) Transfers: Sit to/from Stand, Bed to chair/wheelchair/BSC Sit to Stand: Min assist, From elevated surface Stand pivot transfers: Min assist         General transfer comment: assist for power up, rise, steady, and transfer off of BSC. Received up on [facility] with OT    Ambulation/Gait Ambulation/Gait assistance: Min assist Gait Distance (Feet): 30 Feet (20+10) Assistive device: Rolling walker (2 wheels) Gait Pattern/deviations: Step-through pattern, Decreased stride length, Trunk flexed Gait velocity: decr     General Gait Details: assist to steady, cues for upright posture and placement within RW. on 3LO2  Stairs            Wheelchair Mobility    Modified Rankin (Stroke Patients Only)       Balance Overall balance assessment: Needs assistance Sitting-balance support: Bilateral upper extremity supported, Single extremity supported, Feet supported Sitting balance-Leahy Scale: Fair     Standing balance support: Bilateral upper extremity supported, Reliant on assistive device for balance Standing balance-Leahy Scale: Poor Standing balance comment: reliant on external support                             Pertinent Vitals/Pain Pain Assessment Pain Assessment: Faces Faces Pain Scale: No hurt Pain Intervention(s): Monitored during session    Home Living Family/patient expects to be discharged to:: [place]  Arrangements: Other (Comment) (Lives with [place] daughter) Available Help at Discharge: Personal care attendant;Available 24 hours/day Type of Home: House Home Access:  Stairs to enter Entrance Stairs-Rails: Psychiatric nurse of Steps: 2 (Per chart review, [facility])   Home Layout: One level Home Equipment: Cane - single point;Rolling Walker (2 wheels) Additional Comments: Information taken from previous admission and chart review. Patient lives at home with his granddaughter.  Per chart, Granddaughter states that patient has 24-hour care with nursing. Pt is HOH and has some confusion at times.    Prior Function Prior Level of Function : Needs assist             Mobility Comments: Per chart review, pt has RW, w/c, hospital bed and ramp is being built by [place]. ADLs Comments: Pt requires assist for ADL's at baseline level, has 24 hour care with nursing     Hand Dominance   Dominant Hand: Right    Extremity/Trunk Assessment   Upper Extremity Assessment Upper Extremity Assessment: Defer to OT evaluation    Lower Extremity Assessment Lower Extremity Assessment: Generalized weakness    Cervical / Trunk Assessment Cervical / Trunk Assessment: Kyphotic  Communication   Communication: HOH  Cognition Arousal/Alertness: Awake/alert Behavior During Therapy: WFL for tasks assessed/performed Overall Cognitive Status: History of cognitive impairments - at baseline                                 General Comments: Pt with h/o dementia at baseline, pt was A+O x3 to person, place [facility] hospital, oriented to [facility] hospital), time/year but not situation        General Comments      Exercises     Assessment/Plan    PT Assessment Patient needs continued PT services  PT Problem List Decreased mobility;Decreased strength;Decreased activity tolerance;Decreased balance;Decreased knowledge of use of DME;Decreased cognition;Decreased safety awareness       PT Treatment Interventions DME instruction;Therapeutic activities;Gait training;Therapeutic exercise;Patient/family education;Balance  training;Functional mobility training;Neuromuscular re-education    PT Goals (Current goals can be found in the Care Plan section)  Acute Rehab PT Goals Patient Stated Goal: home PT Goal Formulation: With patient Time For Goal Achievement: 08/08/22 Potential to Achieve Goals: Good    Frequency Min 2X/week     Co-evaluation               AM-PAC PT "6 Clicks" Mobility  Outcome Measure Help needed turning from your back to your side while in a flat bed without using bedrails?: A Little Help needed moving from lying on your back to sitting on the side of a flat bed without using bedrails?: A Little Help needed moving to and from a bed to a chair (including a wheelchair)?: A Little Help needed standing up from a chair using your arms (e.g., wheelchair or bedside chair)?: A Little Help needed to walk in hospital room?: A Lot Help needed climbing 3-5 steps with a railing? : A Lot 6 Click Score: 16    End of Session Equipment Utilized During Treatment: Oxygen Activity Tolerance: Patient tolerated treatment well Patient left: in chair;with call bell/phone within reach;with chair alarm set Nurse Communication: Mobility status PT Visit Diagnosis: Other abnormalities of gait and mobility (R26.89);Muscle weakness (generalized) (M62.81)    Time: [id number] PT Time Calculation (min) (ACUTE ONLY): 20 min   Charges:   PT Evaluation $PT Eval Low Complexity: 1 Low  Stacie Glaze, PT DPT Acute Rehabilitation Services Pager [phone]  Office [phone]   Louis Matte 07/25/2022, 3:48 PM

## 2022-07-25 NOTE — Procedures (Signed)
PROCEDURE SUMMARY:  Successful US guided right thoracentesis. Yielded 850 mL of clear yellow fluid. Patient tolerated procedure well. No immediate complications. EBL = trace  Specimen was sent for labs.  Post procedure chest X-ray reveals no pneumothorax  Cartha Rotert S Pierson Vantol PA-C 07/25/2022 12:54 PM

## 2022-07-25 NOTE — Progress Notes (Signed)
PROGRESS NOTE    [name]  [id number] DOB: [birth year] DOA: 07/22/2022 PCP: Matthew Maw, MD  Chief Complaint  Patient presents with   Shortness of Breath    Brief Narrative:   89 year old man PMH including diastolic CHF, CKD, diabetes mellitus type 2, chronic hypoxic respiratory failure on 4 L presented to the emergency department with shortness of breath, reported hypotension by EMS.  Recently hospitalized and underwent left thoracentesis.  PMH includes bilateral pleural effusions.  Admitted for shortness of breath and treatment for pleural effusion.  Lives at home with granddaughter has 24-hour nursing.    Assessment & Plan:   Principal Problem:   Bilateral pleural effusion Active Problems:   Edema due to hypoalbuminemia   Type 2 diabetes mellitus without complication, without long-term current use of insulin ([name])   PPM-Medtronic   Chronic diastolic CHF (congestive heart failure) ([name])   Stage 3b chronic kidney disease ([name])   Essential hypertension   Alzheimer disease ([place])   DNR (do not resuscitate)/DNI(Do Not Intubate)   Chronic hypoxic respiratory failure, on home oxygen therapy ([name]) - 4 L/min  Heart Failure Exacerbation Bilateral pleural effusion Chronic hypoxic respiratory failure, on home oxygen therapy ([name]) - 4 L/min Hypoalbuminemia  - currently on 3 L by [place] Last thoracentesis 2/20 on the left.  Appears transudative by lights.  Cytology without malignant cells.  Sent home on oral Lasix 20 mg daily.   S/p L thoracentesis with 1.2 L of clear yellow fluid removed on 3/5 (no labs sent) Had repeat L thora 3/6 with additional 600 cc out (follow thora labs - pleural fluid LDH suggests exudative) Pending R thoracentesis today (will repeat thora labs) Suspect bilateral effusions related to diastolic HF - he does have low albumin as well which likely contributing S/p lasix 20 mg x2 Will consult renal with his soft blood pressures and baseline CKD, I  think he might benefit from more aggressive diuresis in the outpatient setting (and here after his thoracenteses are complete)   AKI on Stage 3b chronic kidney disease ([name]) Creatinine [name] bit worse on admission 2.48 with baseline perhaps around 1.8 - 2.0  S/p lasix.  Albumin May be difficult to manage edema and CKD - will ask for renal assistance  Pericardial Effusion - noted 2/18 echo, moderate pericardial effusion, larger than 12/2021 effusion - follow with diuresis  Alzheimer disease ([place]) Appears stable.  Continue Namenda.   Essential hypertension Stable.  Continue Toprol-XL 25 mg   Atrial Fibrillation - xarelto on hold, will transition to eliquis given his CKD   PPM-Medtronic   Type 2 diabetes mellitus without complication, without long-term current use of insulin ([name]) CBG stable.  Continue SSI.  will recommend stopping actos at discharge, since will plan for d/c to SNF, will likely d/c with SSI   DNR (do not resuscitate)/DNI(Do Not Intubate)     DVT prophylaxis: xarelto on hold pending thora -> will transition to eliquis once ready Code Status: DNR Family Communication: family friend at bedside Disposition:   Status is: Inpatient Remains inpatient appropriate because: pending thoracentesis and additional [place] conversations   Consultants:  IR  Procedures:  Left thoracentesis IMPRESSION: Successful ultrasound guided left thoracentesis yielding 1.2 L of pleural fluid.  Antimicrobials:  Anti-infectives (From admission, onward)    None       Subjective: No complaints Asking for sugar and cream for his coffee this AM Discussed with daughter, had repeat L thora yesterday, I'd intended for R - discussed this with  daughter - will need to do R today  Objective: Vitals:   07/24/22 1705 07/24/22 2049 07/25/22 0438 07/25/22 0733  BP: 114/61 115/84 100/63 97/71  Pulse: 83 87 90 77  Resp: '17 17 16   '$ Temp: 97.8 F (36.6 C) 97.7 F (36.5 C) 98.5 F (36.9 C)  97.7 F (36.5 C)  TempSrc: Oral Oral  Oral  SpO2: 100% 100% 100% 97%  Weight:      Height:        Intake/Output Summary (Last 24 hours) at 07/25/2022 1241 Last data filed at 07/24/2022 2040 Gross per 24 hour  Intake 120 ml  Output --  Net 120 ml   Filed Weights   07/23/22 0635  Weight: 68.9 kg    Examination:  General exam: Appears calm and comfortable  Respiratory system: unlabored Cardiovascular system: RRR Central nervous system: Alert, sleepy. No focal neurological deficits. Extremities: no LEE   Data Reviewed: I have personally reviewed following labs and imaging studies  CBC: Recent Labs  Lab 07/22/22 1557 07/23/22 0945 07/25/22 0627  WBC 8.6 6.5 6.3  NEUTROABS 6.2 4.7 4.5  HGB 9.0* 9.8* 9.1*  HCT 27.4* 28.5* 27.6*  MCV 77.8* 74.4* 75.6*  PLT 182 192 [id number]    Basic Metabolic Panel: Recent Labs  Lab 07/22/22 1557 07/23/22 0945 07/25/22 0627  NA 137 138 138  K 4.6 4.6 4.5  CL 105 105 108  CO2 '22 24 23  '$ GLUCOSE 158* 132* 115*  BUN 32* 34* 31*  CREATININE 2.48* 2.54* 2.22*  CALCIUM 7.4* 8.0* 8.0*  MG  --  2.0 2.0  PHOS  --   --  3.3    GFR: Estimated Creatinine Clearance: 19 mL/min ([name]) (by C-G formula based on SCr of 2.22 mg/dL (H)).  Liver Function Tests: Recent Labs  Lab 07/22/22 1557 07/23/22 0945 07/25/22 0627  AST 17 14* 14*  ALT '8 8 9  '$ ALKPHOS 47 58 44  BILITOT 0.3 0.7 0.6  PROT 5.2* 5.8* 5.2*  ALBUMIN 2.4* 2.7* 2.7*    CBG: Recent Labs  Lab 07/24/22 1228 07/24/22 1702 07/24/22 2043 07/25/22 0733 07/25/22 1215  GLUCAP 121* 210* 153* 120* 188*     Recent Results (from the past 240 hour(s))  Resp panel by RT-PCR (RSV, Flu [name]&B, Covid) Anterior Nasal Swab     Status: None   Collection Time: 07/22/22  3:58 PM   Specimen: Anterior Nasal Swab  Result Value Ref Range Status   SARS Coronavirus 2 by RT PCR NEGATIVE NEGATIVE Final   Influenza [name] by PCR NEGATIVE NEGATIVE Final   Influenza B by PCR NEGATIVE NEGATIVE Final     Comment: (NOTE) The Xpert Xpress SARS-CoV-2/FLU/RSV plus assay is intended as an aid in the diagnosis of influenza from Nasopharyngeal swab specimens and should not be used as [name] sole basis for treatment. Nasal washings and aspirates are unacceptable for Xpert Xpress SARS-CoV-2/FLU/RSV testing.  Fact Sheet for Patients: EntrepreneurPulse.com.au  Fact Sheet for Healthcare Providers: IncredibleEmployment.be  This test is not yet approved or cleared by the [place] FDA and has been authorized for detection and/or diagnosis of SARS-CoV-2 by FDA under an Emergency Use Authorization (EUA). This EUA will remain in effect (meaning this test can be used) for the duration of the COVID-19 declaration under Section 564(b)(1) of the Act, 21 U.S.C. section 360bbb-3(b)(1), unless the authorization is terminated or revoked.     Resp Syncytial Virus by PCR NEGATIVE NEGATIVE Final    Comment: (NOTE) Fact Sheet for Patients:  EntrepreneurPulse.com.au  Fact Sheet for Healthcare Providers: IncredibleEmployment.be  This test is not yet approved or cleared by the [place] FDA and has been authorized for detection and/or diagnosis of SARS-CoV-2 by FDA under an Emergency Use Authorization (EUA). This EUA will remain in effect (meaning this test can be used) for the duration of the COVID-19 declaration under Section 564(b)(1) of the Act, 21 U.S.C. section 360bbb-3(b)(1), unless the authorization is terminated or revoked.  Performed at [facility], [place] [address] [place], [place] [address]   Culture, body fluid w Gram Stain-bottle     Status: None (Preliminary result)   Collection Time: 07/24/22  4:34 PM   Specimen: Fluid  Result Value Ref Range Status   Specimen Description FLUID PLEURAL LEFT  Final   Special Requests BOTTLES DRAWN AEROBIC AND ANAEROBIC  Final   Culture   Final    NO GROWTH < 24  HOURS Performed at [facility], [place] [address] [place], [place] [address]    Report Status PENDING  Incomplete  Gram stain     Status: None   Collection Time: 07/24/22  4:34 PM   Specimen: Fluid  Result Value Ref Range Status   Specimen Description FLUID PLEURAL LEFT  Final   Special Requests NONE  Final   Gram Stain   Final    WBC PRESENT, PREDOMINANTLY PMN NO ORGANISMS SEEN CYTOSPIN SMEAR Performed at [facility], [place] [address] [place], [place] [address]    Report Status 07/24/2022 FINAL  Final         Radiology Studies: IR THORACENTESIS ASP PLEURAL SPACE W/IMG GUIDE  Result Date: 07/24/2022 INDICATION: Patient with history of recurrent left pleural effusion. Request for repeat left thoracentesis. EXAM: ULTRASOUND GUIDED LEFT THORACENTESIS MEDICATIONS: 10 mL 1% lidocaine COMPLICATIONS: None immediate. PROCEDURE: An ultrasound guided thoracentesis was thoroughly discussed with the patient and questions answered. The benefits, risks, alternatives and complications were also discussed. The patient understands and wishes to proceed with the procedure. Written consent was obtained. Ultrasound was performed to localize and mark an adequate pocket of fluid in the left chest. The area was then prepped and draped in the normal sterile fashion. 1% Lidocaine was used for local anesthesia. Under ultrasound guidance Xhaiden Coombs 6 Fr Safe-T-Centesis catheter was introduced. Thoracentesis was performed. The catheter was removed and Dustine Bertini dressing applied. FINDINGS: Aydan Levitz total of approximately 600 mL of yellow fluid was removed. Samples were sent to the laboratory as requested by the clinical team. IMPRESSION: Successful ultrasound guided left thoracentesis yielding 600 mL of pleural fluid. Read by: Brynda Greathouse PA-C Electronically Signed   By: Ruthann Cancer M.D.   On: 07/24/2022 16:26   DG Chest Port 1 View  Result Date: 07/24/2022 CLINICAL DATA:  Pleural effusion. EXAM: PORTABLE CHEST 1 VIEW  COMPARISON:  July 23, 2022. FINDINGS: Enlarged cardiac silhouette. Left subclavian approach cardiac rhythm maintenance device. Layering bilateral pleural effusions. No visible pneumothorax. Polyarticular degenerative change. IMPRESSION: Layering bilateral pleural effusions without visible pneumothorax. Electronically Signed   By: Margaretha Sheffield M.D.   On: 07/24/2022 16:20        Scheduled Meds:  feeding supplement (GLUCERNA SHAKE)  237 mL Oral TID BM   finasteride  5 mg Oral Daily   insulin aspart  0-5 Units Subcutaneous QHS   insulin aspart  0-9 Units Subcutaneous TID WC   latanoprost  1 drop Both Eyes QHS   melatonin  3 mg Oral QHS   memantine  10 mg  Oral BID   metoprolol succinate  25 mg Oral Daily   multivitamin with minerals  1 tablet Oral Daily   pantoprazole  40 mg Oral Daily   tamsulosin  0.4 mg Oral Daily   Continuous Infusions:   LOS: 2 days    Time spent: over 30 min    Fayrene Helper, MD [facility]   To contact the attending provider between 7A-7P or the covering provider during after hours 7P-7A, please log into the web site [email or web address] and access using universal [place] password for that web site. If you do not have the password, please call the hospital operator.  07/25/2022, 12:41 PM

## 2022-07-25 NOTE — Progress Notes (Signed)
ANTICOAGULATION CONSULT NOTE - Initial Consult  Pharmacy Consult for apixaban Indication: atrial fibrillation  No Known Allergies  Patient Measurements: Height: '5\' 10"'$  (177.8 cm) Weight: 68.9 kg (151 lb 14.4 oz) IBW/kg (Calculated) : 73 Heparin Dosing Weight:   Vital Signs: Temp: 97.7 F (36.5 C) (03/07 0733) Temp Source: Oral (03/07 0733) BP: 97/71 (03/07 0733) Pulse Rate: 77 (03/07 0733)  Labs: Recent Labs    07/22/22 1557 07/22/22 1713 07/23/22 0945 07/25/22 0627  HGB 9.0*  --  9.8* 9.1*  HCT 27.4*  --  28.5* 27.6*  PLT 182  --  192 164  CREATININE 2.48*  --  2.54* 2.22*  TROPONINIHS 21* 23*  --   --     Estimated Creatinine Clearance: 19 mL/min (A) (by C-G formula based on SCr of 2.22 mg/dL (H)).   Medical History: Past Medical History:  Diagnosis Date   Acute CVA (cerebrovascular accident) ([place])    Arthritis    Atrial fibrillation ([place])    CKD (chronic kidney disease)    History of hiatal hernia    Hx SBO    Last 2013 all treated conservatively   Hypertension    Presence of permanent cardiac pacemaker    Prostate cancer ([place])    Stroke ([place]) 2017   left facial drooping noted 08/14/2016   Tachycardia-bradycardia syndrome ([place])    Type II diabetes mellitus ([place])    Ventral hernia    x3    Medications:  Medications Prior to Admission  Medication Sig Dispense Refill Last Dose   divalproex (DEPAKOTE SPRINKLE) 125 MG capsule Take 1 capsule (125 mg total) by mouth 2 (two) times daily. 60 capsule 0 07/22/2022 at 0800   docusate sodium (COLACE) 100 MG capsule Take 1 capsule (100 mg total) by mouth daily. 30 capsule 0 07/21/2022 at pm   finasteride (PROSCAR) 5 MG tablet TAKE 1 TABLET (5 MG TOTAL) BY MOUTH DAILY. 90 tablet 1 07/22/2022   furosemide (LASIX) 20 MG tablet Take 1 tablet (20 mg total) by mouth daily. 30 tablet 0 07/22/2022 at am   Iron, Ferrous Sulfate, 325 (65 Fe) MG TABS Take 1 tablet every other day. (Patient taking differently: Take 1 tablet by mouth  every other day.) 45 tablet 1 07/22/2022 at am   latanoprost (XALATAN) 0.005 % ophthalmic solution Place 1 drop into both eyes at bedtime.   07/21/2022 at pm   loratadine (CLARITIN) 10 MG tablet Take 10 mg by mouth daily as needed for rhinitis.   UNKNOWN   Melatonin 3 MG TBDP Take 1 tablet by mouth at bedtime. (Patient taking differently: Take 3 mg by mouth at bedtime.) 30 tablet 0 07/21/2022 at pm   memantine (NAMENDA) 10 MG tablet Take 10 mg by mouth 2 (two) times daily.   07/22/2022 at 0800   metoprolol succinate (TOPROL-XL) 25 MG 24 hr tablet Take 1 tablet (25 mg total) by mouth daily. 30 tablet 0 07/21/2022 at 1900   pantoprazole (PROTONIX) 40 MG tablet TAKE 1 TABLET(40 MG) BY MOUTH DAILY (Patient taking differently: Take 40 mg by mouth daily.) 90 tablet 1 07/22/2022 at am   pioglitazone (ACTOS) 15 MG tablet Take 1 tablet (15 mg total) by mouth daily. 30 tablet 5 07/22/2022 at am   Rivaroxaban (XARELTO) 15 MG TABS tablet TAKE 1 TABLET DAILY WITH SUPPER (Patient taking differently: Take 15 mg by mouth daily with supper.) 90 tablet 1 07/21/2022 at 1900   tamsulosin (FLOMAX) 0.4 MG CAPS capsule Take 1 capsule (0.4 mg total)  by mouth daily. 90 capsule 3 07/21/2022 at pm   polyethylene glycol powder (GLYCOLAX/MIRALAX) 17 GM/SCOOP powder Take 17 g by mouth 2 (two) times daily as needed. (Patient not taking: Reported on 07/22/2022) 3350 g 1 Not Taking   traZODone (DESYREL) 50 MG tablet Take 1 tablet (50 mg total) by mouth at bedtime as needed for sleep. (Patient not taking: Reported on 07/22/2022) 30 tablet 0 Not Taking   Scheduled:   feeding supplement (GLUCERNA SHAKE)  237 mL Oral TID BM   finasteride  5 mg Oral Daily   insulin aspart  0-5 Units Subcutaneous QHS   insulin aspart  0-9 Units Subcutaneous TID WC   latanoprost  1 drop Both Eyes QHS   lidocaine       melatonin  3 mg Oral QHS   memantine  10 mg Oral BID   metoprolol succinate  25 mg Oral Daily   multivitamin with minerals  1 tablet Oral Daily    pantoprazole  40 mg Oral Daily   tamsulosin  0.4 mg Oral Daily    Assessment: Pt was on Xarelto for his AF prior to admission. It has been on hold for thoracentesis. He got it done on the left side yesterday. Plan for right side today. D/w Dr Florene Glen and apixaban is a better option due to his CrCl. Ok to resume anticoagulation with apixaban starting tomorrow.   Age>80 Scr>1.5  Goal of Therapy:  Monitor platelets by anticoagulation protocol: Yes   Plan:  Start apixaban 2.'5mg'$  BID on 3/8 Rx will follow peripherally  Onnie Boer, PharmD, BCIDP, AAHIVP, CPP Infectious Disease Pharmacist 07/25/2022 12:57 PM

## 2022-07-25 NOTE — Care Plan (Signed)
Patient AxOx4, pleasant, very hard of hearing but able to answer all questions appropriately. Denied pain. Patient still on 3 L O2 by [place]. Lungs clear, but diminished lung sounds. Thoracentesis puncture sites on L and R side of back WNL. Patient ate aprox 30% of meals but drink all Glucerna. No falls or injuries during this shift. Plan of care discussed with patient and hourly rounds Q2 hrs done. His grand-son in low at the bedside in the afternoon.

## 2022-07-25 NOTE — Consult Note (Addendum)
Reason for Consult:AKI Referring Physician: Dr. Fayrene Helper  Chief Complaint: AKI on CKD IIIb w/ CHF exacerbation  Assessment/Plan: AKI on CKD IIIb Baseline Cr around ~1.8 and patient d/c from last admission w/ Cr at 2.13. Patient admitted w/ Cr of 2.48 that has since come down to 2.22 today. AKI like in setting of diuresis for CHF exacerbation, as holding diuresis has improved Cr. Patient likely re-accumulated volume 2/2 non-compliance with low salt diet, and hypoalbuminemia  -Continue holding diuresis   -Would start lasix 40 po daily tomorrow CHF Exacerbation Patient w/ hx of CHF w/ LVEF 55-60%, RV mildly reduced systolic function and mildly elevated PA systolic pressure in [id number]. 07/07/22 LV EF 50-55% and indeterminate diastolic paramters w/ normal RV function. Patient presented w/ SOB and was found to have bilateral pleural effusions. Patient recently admitted for same issue and had 1.3 L thoracentesis on 2/20. Patient has been receiving diuresis, but pleural effusions likely 2/2 low albumin, and poor adherence to low salt diet   -Plans per primary team Pleural Effusion Noted on CXR 07/22/22. Patient s/p thoracentesis x 2 on left (1.2 L and 600 mL), and x 1 on right (850 mL)  -Plans per day team Pericardial Effusion Noted on Echo 07/07/22, moderate size, and larger than what was noted 12/18/21  -Plans per primary team Atrial Fibrillation  Patient is rate controlled on Xarelto and metoprolol succ at home, switched to Eliquis this admission  -Plans per primary team Anemia Patient Hgb 9.1 today, w/ baseline around 8-9. This may be stemming from his CKD. Will continue to monitor and consider ESA in the future. -Consider ESA Alzheimer's disease Patient w/ Hx of Alzheimer's on Namenda  -Plans per primary team DM2 Patient on insulin at home. A1c 9.2  -Plans per primary team  I personally saw and evaluated the patient and agree with the assessment and plan by Holley Bouche, MD.    Will  increase standing dose of oral lasix starting tomorrow.  Per family he really likes eating high sodium diet and if he is unable to adhere to lower sodium diet he will likely have recurrent admissions for the same.  May need to address Gargatha further.   Jannifer Hick MD [facility] Pager [phone]    HPI: [name] is an 89 y.o. male w/ PMH including diastolic CHF, CKD, diabetes mellitus type 2, chronic hypoxic respiratory failure on 4 L, A. Fib, and tachybradycardia syndrome s/p pacemaker, who presented to the emergency department with shortness of breath, reported hypotension by EMS, found to have bilateral pleural effusions.    Patient recently hospitalized for similar issue w/ diastolic CHF exacerbation and bilateral pleural effusion/pericardial effusion from 07/06/22-07/10/22. During last admission, patient underwent left thoracentesis on 2/20 w/ 1.3 L removed. Patient d/c home w/ 20 mg lasix PO.     Chemistry and CBC: Creat  Date/Time Value Ref Range Status  08/03/2021 04:58 PM 1.50 (H) 0.70 - 1.22 mg/dL Final  11/17/2018 02:29 PM 1.93 (H) 0.70 - 1.11 mg/dL Final    Comment:    For patients >56 years of age, the reference limit for Creatinine is approximately 13% higher for people identified as African-American. .   04/10/2018 03:28 PM 1.45 (H) 0.70 - 1.11 mg/dL Final    Comment:    For patients >23 years of age, the reference limit for Creatinine is approximately 13% higher for people identified as African-American. .   07/30/2016 10:59 AM 1.58 (H) 0.70 - 1.11 mg/dL Final  Comment:      For patients > or = 87 years of age: The upper reference limit for Creatinine is approximately 13% higher for people identified as African-American.      Creatinine, Ser  Date/Time Value Ref Range Status  07/25/2022 06:27 AM 2.22 (H) 0.61 - 1.24 mg/dL Final  07/23/2022 09:45 AM 2.54 (H) 0.61 - 1.24 mg/dL Final  07/22/2022 03:57 PM 2.48 (H) 0.61 - 1.24 mg/dL Final   07/10/2022 04:08 AM 2.13 (H) 0.61 - 1.24 mg/dL Final  07/09/2022 04:33 AM 2.03 (H) 0.61 - 1.24 mg/dL Final  07/08/2022 03:59 AM 2.05 (H) 0.61 - 1.24 mg/dL Final  07/07/2022 03:49 AM 1.92 (H) 0.61 - 1.24 mg/dL Final  07/06/2022 12:11 PM 1.79 (H) 0.61 - 1.24 mg/dL Final  05/30/2022 02:46 PM 2.08 (H) 0.40 - 1.50 mg/dL Final  03/09/2022 08:35 PM 1.84 (H) 0.61 - 1.24 mg/dL Final  02/10/2022 06:42 PM 2.19 (H) 0.61 - 1.24 mg/dL Final  02/05/2022 09:28 AM 2.30 (H) 0.61 - 1.24 mg/dL Final  02/05/2022 09:24 AM 2.26 (H) 0.61 - 1.24 mg/dL Final  01/29/2022 11:56 AM 2.06 (H) 0.76 - 1.27 mg/dL Final  01/19/2022 08:28 PM 2.37 (H) 0.61 - 1.24 mg/dL Final  01/08/2022 10:29 AM 2.02 (H) 0.76 - 1.27 mg/dL Final  01/01/2022 03:35 AM 2.23 (H) 0.61 - 1.24 mg/dL Final  12/31/2021 01:01 PM 2.36 (H) 0.61 - 1.24 mg/dL Final  12/18/2021 09:38 AM 1.95 (H) 0.76 - 1.27 mg/dL Final  12/10/2021 11:50 AM 1.86 (H) 0.76 - 1.27 mg/dL Final  12/05/2021 01:49 PM 1.91 (H) 0.61 - 1.24 mg/dL Final  11/30/2021 09:39 AM 1.93 (H) 0.40 - 1.50 mg/dL Final  08/01/2021 05:01 AM 1.63 (H) 0.61 - 1.24 mg/dL Final  07/31/2021 05:04 AM 1.62 (H) 0.61 - 1.24 mg/dL Final  07/30/2021 07:43 AM 1.35 (H) 0.61 - 1.24 mg/dL Final  07/29/2021 09:43 AM 1.70 (H) 0.61 - 1.24 mg/dL Final  04/25/2021 12:28 PM 1.53 (H) 0.40 - 1.50 mg/dL Final  10/30/2020 10:51 AM 1.72 (H) 0.40 - 1.50 mg/dL Final  07/27/2020 08:51 AM 1.36 0.40 - 1.50 mg/dL Final  05/24/2020 06:21 PM 1.67 (H) 0.61 - 1.24 mg/dL Final  05/17/2020 11:01 AM 1.77 (H) 0.40 - 1.50 mg/dL Final  03/21/2020 03:07 PM 1.56 (H) 0.40 - 1.50 mg/dL Final  01/27/2020 11:58 AM 1.69 (H) 0.40 - 1.50 mg/dL Final  01/16/2018 10:34 AM 1.54 (H) 0.40 - 1.50 mg/dL Final  12/10/2017 12:02 PM 1.42 0.40 - 1.50 mg/dL Final  12/05/2017 07:52 AM 1.69 (H) 0.61 - 1.24 mg/dL Final  12/04/2017 09:55 AM 1.70 (H) 0.61 - 1.24 mg/dL Final  12/03/2017 04:38 AM 1.79 (H) 0.61 - 1.24 mg/dL Final  12/02/2017 04:58 AM 1.74 (H)  0.61 - 1.24 mg/dL Final  12/01/2017 01:06 PM 2.03 (H) 0.61 - 1.24 mg/dL Final  10/16/2017 10:30 AM 1.44 0.40 - 1.50 mg/dL Final  09/03/2017 11:08 AM 1.55 (H) 0.40 - 1.50 mg/dL Final  01/17/2017 05:38 AM 1.41 (H) 0.61 - 1.24 mg/dL Final  01/16/2017 06:06 AM 1.38 (H) 0.61 - 1.24 mg/dL Final  01/15/2017 05:23 AM 1.62 (H) 0.61 - 1.24 mg/dL Final  01/14/2017 05:36 AM 1.83 (H) 0.61 - 1.24 mg/dL Final  01/13/2017 07:08 AM 1.79 (H) 0.61 - 1.24 mg/dL Final  01/11/2017 07:54 PM 1.65 (H) 0.61 - 1.24 mg/dL Final  11/08/2016 11:00 AM 1.65 (H) 0.76 - 1.27 mg/dL Final  11/02/2016 04:38 PM 1.48 (H) 0.61 - 1.24 mg/dL Final  08/15/2016 03:53 AM 1.41 (H) 0.61 -  1.24 mg/dL Final  08/12/2016 10:28 AM 1.54 (H) 0.61 - 1.24 mg/dL Final   Recent Labs  Lab 07/22/22 1557 07/23/22 0945 07/25/22 0627  NA 137 138 138  K 4.6 4.6 4.5  CL 105 105 108  CO2 '22 24 23  '$ GLUCOSE 158* 132* 115*  BUN 32* 34* 31*  CREATININE 2.48* 2.54* 2.22*  CALCIUM 7.4* 8.0* 8.0*  PHOS  --   --  3.3   Recent Labs  Lab 07/22/22 1557 07/23/22 0945 07/25/22 0627  WBC 8.6 6.5 6.3  NEUTROABS 6.2 4.7 4.5  HGB 9.0* 9.8* 9.1*  HCT 27.4* 28.5* 27.6*  MCV 77.8* 74.4* 75.6*  PLT 182 192 164   Liver Function Tests: Recent Labs  Lab 07/22/22 1557 07/23/22 0945 07/25/22 0627  AST 17 14* 14*  ALT '8 8 9  '$ ALKPHOS 47 58 44  BILITOT 0.3 0.7 0.6  PROT 5.2* 5.8* 5.2*  ALBUMIN 2.4* 2.7* 2.7*   No results for input(s): "LIPASE", "AMYLASE" in the last 168 hours. No results for input(s): "AMMONIA" in the last 168 hours. Cardiac Enzymes: No results for input(s): "CKTOTAL", "CKMB", "CKMBINDEX", "TROPONINI" in the last 168 hours. Iron Studies: No results for input(s): "IRON", "TIBC", "TRANSFERRIN", "FERRITIN" in the last 72 hours. PT/INR: '@LABRCNTIP'$ (inr:5)  Xrays/Other Studies: ) Results for orders placed or performed during the hospital encounter of 07/22/22 (from the past 48 hour(s))  Glucose, capillary     Status: Abnormal    Collection Time: 07/23/22  5:01 PM  Result Value Ref Range   Glucose-Capillary 126 (H) 70 - 99 mg/dL    Comment: Glucose reference range applies only to samples taken after fasting for at least 8 hours.  Glucose, capillary     Status: Abnormal   Collection Time: 07/23/22  9:05 PM  Result Value Ref Range   Glucose-Capillary 135 (H) 70 - 99 mg/dL    Comment: Glucose reference range applies only to samples taken after fasting for at least 8 hours.  Glucose, capillary     Status: None   Collection Time: 07/24/22  8:16 AM  Result Value Ref Range   Glucose-Capillary 94 70 - 99 mg/dL    Comment: Glucose reference range applies only to samples taken after fasting for at least 8 hours.  Glucose, capillary     Status: Abnormal   Collection Time: 07/24/22 12:28 PM  Result Value Ref Range   Glucose-Capillary 121 (H) 70 - 99 mg/dL    Comment: Glucose reference range applies only to samples taken after fasting for at least 8 hours.  Lactate dehydrogenase     Status: None   Collection Time: 07/24/22  3:01 PM  Result Value Ref Range   LDH 144 98 - 192 U/L    Comment: Performed at [facility], [place] [address], [place], [place] [address]  Lactate dehydrogenase (pleural or peritoneal fluid)     Status: Abnormal   Collection Time: 07/24/22  4:34 PM  Result Value Ref Range   LD, Fluid 122 (H) 3 - 23 U/L    Comment: (NOTE) Results should be evaluated in conjunction with serum values    Fluid Type-FLDH PLEURAL     Comment: LEFT FLUID Performed at [facility] [address] [place], [place] [address] CORRECTED ON 03/06 AT 1910: PREVIOUSLY REPORTED AS Lung, Left   Body fluid cell count with differential     Status: Abnormal   Collection Time: 07/24/22  4:34 PM  Result Value Ref Range   Fluid  Type-FCT PLEURAL     Comment: LEFT FLUID CORRECTED ON 03/06 AT 1910: PREVIOUSLY REPORTED AS Lung, Left    Color, Fluid YELLOW YELLOW   Appearance, Fluid HAZY (A) CLEAR   Total Nucleated Cell  Count, Fluid 490 0 - 1,000 cu mm   Neutrophil Count, Fluid 57 (H) 0 - 25 %   Lymphs, Fluid 31 %   Monocyte-Macrophage-Serous Fluid 12 (L) 50 - 90 %   Eos, Fluid 0 %    Comment: Performed at [facility] [address] [place], [place] [address]  Protein, pleural or peritoneal fluid     Status: None   Collection Time: 07/24/22  4:34 PM  Result Value Ref Range   Total protein, fluid <3.0 g/dL    Comment: (NOTE) No normal range established for this test Results should be evaluated in conjunction with serum values    Fluid Type-FTP PLEURAL     Comment: LEFT FLUID Performed at [facility] [address] [place], [place] [address] CORRECTED ON 03/06 AT 1910: PREVIOUSLY REPORTED AS Lung, Left   Culture, body fluid w Gram Stain-bottle     Status: None (Preliminary result)   Collection Time: 07/24/22  4:34 PM   Specimen: Fluid  Result Value Ref Range   Specimen Description FLUID PLEURAL LEFT    Special Requests BOTTLES DRAWN AEROBIC AND ANAEROBIC    Culture      NO GROWTH < 24 HOURS Performed at [facility], [place] [address], [place], [place] [address]    Report Status PENDING   Gram stain     Status: None   Collection Time: 07/24/22  4:34 PM   Specimen: Fluid  Result Value Ref Range   Specimen Description FLUID PLEURAL LEFT    Special Requests NONE    Gram Stain      WBC PRESENT, PREDOMINANTLY PMN NO ORGANISMS SEEN CYTOSPIN SMEAR Performed at [facility], [place] [address], [place], [place] [address]    Report Status 07/24/2022 FINAL   Glucose, capillary     Status: Abnormal   Collection Time: 07/24/22  5:02 PM  Result Value Ref Range   Glucose-Capillary 210 (H) 70 - 99 mg/dL    Comment: Glucose reference range applies only to samples taken after fasting for at least 8 hours.  Glucose, capillary     Status: Abnormal   Collection Time: 07/24/22  8:43 PM  Result Value Ref Range   Glucose-Capillary 153 (H) 70 - 99 mg/dL    Comment: Glucose reference  range applies only to samples taken after fasting for at least 8 hours.  Brain natriuretic peptide     Status: Abnormal   Collection Time: 07/25/22  6:27 AM  Result Value Ref Range   B Natriuretic Peptide 535.3 (H) 0.0 - 100.0 pg/mL    Comment: Performed at [facility] [address] [place], [place] [address]  CBC with Differential/Platelet     Status: Abnormal   Collection Time: 07/25/22  6:27 AM  Result Value Ref Range   WBC 6.3 4.0 - 10.5 K/uL   RBC 3.65 (L) 4.22 - 5.81 MIL/uL   Hemoglobin 9.1 (L) 13.0 - 17.0 g/dL   HCT 27.6 (L) 39.0 - 52.0 %   MCV 75.6 (L) 80.0 - 100.0 fL   MCH 24.9 (L) 26.0 - 34.0 pg   MCHC 33.0 30.0 - 36.0 g/dL   RDW 17.2 (H) 11.5 - 15.5 %   Platelets 164 150 -  400 K/uL   nRBC 0.0 0.0 - 0.2 %   Neutrophils Relative % 70 %   Neutro Abs 4.5 1.7 - 7.7 K/uL   Lymphocytes Relative 16 %   Lymphs Abs 1.0 0.7 - 4.0 K/uL   Monocytes Relative 12 %   Monocytes Absolute 0.7 0.1 - 1.0 K/uL   Eosinophils Relative 1 %   Eosinophils Absolute 0.1 0.0 - 0.5 K/uL   Basophils Relative 0 %   Basophils Absolute 0.0 0.0 - 0.1 K/uL   Immature Granulocytes 1 %   Abs Immature Granulocytes 0.04 0.00 - 0.07 K/uL    Comment: Performed at [facility] [address] [place], [place] [address]  Comprehensive metabolic panel     Status: Abnormal   Collection Time: 07/25/22  6:27 AM  Result Value Ref Range   Sodium 138 135 - 145 mmol/L   Potassium 4.5 3.5 - 5.1 mmol/L   Chloride 108 98 - 111 mmol/L   CO2 23 22 - 32 mmol/L   Glucose, Bld 115 (H) 70 - 99 mg/dL    Comment: Glucose reference range applies only to samples taken after fasting for at least 8 hours.   BUN 31 (H) 8 - 23 mg/dL   Creatinine, Ser 2.22 (H) 0.61 - 1.24 mg/dL   Calcium 8.0 (L) 8.9 - 10.3 mg/dL   Total Protein 5.2 (L) 6.5 - 8.1 g/dL   Albumin 2.7 (L) 3.5 - 5.0 g/dL   AST 14 (L) 15 - 41 U/L   ALT 9 0 - 44 U/L   Alkaline Phosphatase 44 38 - 126 U/L   Total Bilirubin 0.6 0.3 - 1.2 mg/dL   GFR,  Estimated 26 (L) >60 mL/min    Comment: (NOTE) Calculated using the CKD-EPI Creatinine Equation (2021)    Anion gap 7 5 - 15    Comment: Performed at [facility], [place] [address] [place], [place] [address]  Magnesium     Status: None   Collection Time: 07/25/22  6:27 AM  Result Value Ref Range   Magnesium 2.0 1.7 - 2.4 mg/dL    Comment: Performed at [facility] [address] [place], [place] [address]  Phosphorus     Status: None   Collection Time: 07/25/22  6:27 AM  Result Value Ref Range   Phosphorus 3.3 2.5 - 4.6 mg/dL    Comment: Performed at [facility] [address] [place], [place] [address]  Glucose, capillary     Status: Abnormal   Collection Time: 07/25/22  7:33 AM  Result Value Ref Range   Glucose-Capillary 120 (H) 70 - 99 mg/dL    Comment: Glucose reference range applies only to samples taken after fasting for at least 8 hours.  Glucose, capillary     Status: Abnormal   Collection Time: 07/25/22 12:15 PM  Result Value Ref Range   Glucose-Capillary 188 (H) 70 - 99 mg/dL    Comment: Glucose reference range applies only to samples taken after fasting for at least 8 hours.   DG Chest 1 View  Result Date: 07/25/2022 CLINICAL DATA:  Status post right thoracentesis EXAM: CHEST  1 VIEW COMPARISON:  Previous studies including the examination of 07/24/2022 FINDINGS: Transverse diameter of heart is increased. There is significant interval decrease in right pleural effusion after thoracentesis. Right lateral CP angle is clear. There is no pneumothorax. There are linear densities in both lower lung fields. There is blunting of left lateral CP angle. Pacemaker  battery is seen in left infraclavicular region. IMPRESSION: There is significant interval decrease in right pleural effusion. There is no pneumothorax. Small patchy infiltrates in both lower lung fields may suggest atelectasis. Small left pleural effusion. Electronically Signed   By: Elmer Picker M.D.    On: 07/25/2022 13:26   IR THORACENTESIS ASP PLEURAL SPACE W/IMG GUIDE  Result Date: 07/24/2022 INDICATION: Patient with history of recurrent left pleural effusion. Request for repeat left thoracentesis. EXAM: ULTRASOUND GUIDED LEFT THORACENTESIS MEDICATIONS: 10 mL 1% lidocaine COMPLICATIONS: None immediate. PROCEDURE: An ultrasound guided thoracentesis was thoroughly discussed with the patient and questions answered. The benefits, risks, alternatives and complications were also discussed. The patient understands and wishes to proceed with the procedure. Written consent was obtained. Ultrasound was performed to localize and mark an adequate pocket of fluid in the left chest. The area was then prepped and draped in the normal sterile fashion. 1% Lidocaine was used for local anesthesia. Under ultrasound guidance a 6 Fr Safe-T-Centesis catheter was introduced. Thoracentesis was performed. The catheter was removed and a dressing applied. FINDINGS: A total of approximately 600 mL of yellow fluid was removed. Samples were sent to the laboratory as requested by the clinical team. IMPRESSION: Successful ultrasound guided left thoracentesis yielding 600 mL of pleural fluid. Read by: Brynda Greathouse PA-C Electronically Signed   By: Ruthann Cancer M.D.   On: 07/24/2022 16:26   DG Chest Port 1 View  Result Date: 07/24/2022 CLINICAL DATA:  Pleural effusion. EXAM: PORTABLE CHEST 1 VIEW COMPARISON:  July 23, 2022. FINDINGS: Enlarged cardiac silhouette. Left subclavian approach cardiac rhythm maintenance device. Layering bilateral pleural effusions. No visible pneumothorax. Polyarticular degenerative change. IMPRESSION: Layering bilateral pleural effusions without visible pneumothorax. Electronically Signed   By: Margaretha Sheffield M.D.   On: 07/24/2022 16:20    PMH:   Past Medical History:  Diagnosis Date   Acute CVA (cerebrovascular accident) ([place])    Arthritis    Atrial fibrillation ([place])    CKD (chronic kidney  disease)    History of hiatal hernia    Hx SBO    Last 2013 all treated conservatively   Hypertension    Presence of permanent cardiac pacemaker    Prostate cancer ([place])    Stroke ([place]) 2017   left facial drooping noted 08/14/2016   Tachycardia-bradycardia syndrome ([place])    Type II diabetes mellitus ([place])    Ventral hernia    x3    PSH:   Past Surgical History:  Procedure Laterality Date   ABDOMINAL AORTOGRAM W/LOWER EXTREMITY N/A 08/07/2016   Procedure: Abdominal Aortogram w/Lower Extremity;  Surgeon: Wellington Hampshire, MD;  Location: [facility];  Service: Cardiovascular;  Laterality: N/A;   CHOLECYSTECTOMY OPEN  03/31/2001   Dr Lindon Romp   COLON SURGERY     EXPLORATORY LAPAROTOMY  08/2004   Archie Endo 10/01/2010   HEMORRHOID SURGERY     HERNIA REPAIR     HIATAL HERNIA REPAIR  2002   INCISIONAL HERNIA REPAIR  08/2004   Archie Endo 10/01/2010   INSERT / REPLACE / REMOVE PACEMAKER     IR THORACENTESIS ASP PLEURAL SPACE W/IMG GUIDE  07/23/2022   IR THORACENTESIS ASP PLEURAL SPACE W/IMG GUIDE  07/24/2022   PERIPHERAL VASCULAR BALLOON ANGIOPLASTY  08/07/2016   Procedure: Peripheral Vascular Balloon Angioplasty;  Surgeon: Wellington Hampshire, MD;  Location: [facility];  Service: Cardiovascular;;  left popliteal artery   PERIPHERAL VASCULAR INTERVENTION  08/14/2016   popliteal artery/notes 08/14/2016   PERIPHERAL  VASCULAR INTERVENTION Left 08/14/2016   Procedure: Peripheral Vascular Intervention;  Surgeon: Wellington Hampshire, MD;  Location: [facility];  Service: Cardiovascular;  Laterality: Left;  popliteal artery   PERMANENT PACEMAKER GENERATOR CHANGE N/A 04/06/2012   Procedure: PERMANENT PACEMAKER GENERATOR CHANGE;  Surgeon: Evans Lance, MD;  Location: [facility];  Service: Cardiovascular;  Laterality: N/A;   PPM GENERATOR CHANGEOUT N/A 02/04/2022   Procedure: PPM GENERATOR CHANGEOUT;  Surgeon: Evans Lance, MD;  Location: [facility];  Service: Cardiovascular;   Laterality: N/A;   SMALL INTESTINE SURGERY  09/16/2004   SBO resection, VH repair    Allergies: No Known Allergies  Medications:   Prior to Admission medications   Medication Sig Start Date End Date Taking? Authorizing Provider  divalproex (DEPAKOTE SPRINKLE) 125 MG capsule Take 1 capsule (125 mg total) by mouth 2 (two) times daily. 05/27/22 07/22/23 Yes [name], MD  docusate sodium (COLACE) 100 MG capsule Take 1 capsule (100 mg total) by mouth daily. 07/10/22  Yes Amin, Ankit Chirag, MD  finasteride (PROSCAR) 5 MG tablet TAKE 1 TABLET (5 MG TOTAL) BY MOUTH DAILY. 06/07/22  Yes [name], MD  furosemide (LASIX) 20 MG tablet Take 1 tablet (20 mg total) by mouth daily. 07/10/22  Yes Amin, Jeanella Flattery, MD  Iron, Ferrous Sulfate, 325 (65 Fe) MG TABS Take 1 tablet every other day. Patient taking differently: Take 1 tablet by mouth every other day. 06/13/22  Yes [name], MD  latanoprost (XALATAN) 0.005 % ophthalmic solution Place 1 drop into both eyes at bedtime. 05/03/20  Yes [provider]  loratadine (CLARITIN) 10 MG tablet Take 10 mg by mouth daily as needed for rhinitis.   Yes [provider]  Melatonin 3 MG TBDP Take 1 tablet by mouth at bedtime. Patient taking differently: Take 3 mg by mouth at bedtime. 07/10/22  Yes Amin, Ankit Chirag, MD  memantine (NAMENDA) 10 MG tablet Take 10 mg by mouth 2 (two) times daily.   Yes [provider]  metoprolol succinate (TOPROL-XL) 25 MG 24 hr tablet Take 1 tablet (25 mg total) by mouth daily. 01/02/22  Yes Vann, Jessica U, DO  pantoprazole (PROTONIX) 40 MG tablet TAKE 1 TABLET(40 MG) BY MOUTH DAILY Patient taking differently: Take 40 mg by mouth daily. 01/01/22  Yes [name], MD  pioglitazone (ACTOS) 15 MG tablet Take 1 tablet (15 mg total) by mouth daily. 02/14/22  Yes [name], MD  Rivaroxaban (XARELTO) 15 MG TABS tablet TAKE 1 TABLET DAILY WITH SUPPER Patient  taking differently: Take 15 mg by mouth daily with supper. 05/22/22  Yes [name], MD  tamsulosin (FLOMAX) 0.4 MG CAPS capsule Take 1 capsule (0.4 mg total) by mouth daily. 12/04/21  Yes [name], MD  polyethylene glycol powder [facility]) 17 GM/SCOOP powder Take 17 g by mouth 2 (two) times daily as needed. Patient not taking: Reported on 07/22/2022 08/03/21   Charyl Dancer, NP  traZODone (DESYREL) 50 MG tablet Take 1 tablet (50 mg total) by mouth at bedtime as needed for sleep. Patient not taking: Reported on 07/22/2022 07/10/22   Damita Lack, MD    Discontinued Meds:   Medications Discontinued During This Encounter  Medication Reason   Rivaroxaban (XARELTO) tablet 15 mg    furosemide (LASIX) injection 20 mg     Social History:  reports that he quit smoking about 67 years ago. His smoking use included cigarettes.  He has a 22.50 pack-year smoking history. He has been exposed to tobacco smoke. He quit smokeless tobacco use about 69 years ago.  His smokeless tobacco use included chew. He reports that he does not drink alcohol and does not use drugs.  Family History:   Family History  Problem Relation Age of Onset   Pneumonia Father    Stroke Brother    Stroke Sister    Cancer Brother        type unknown    Blood pressure 97/71, pulse 77, temperature 97.7 F (36.5 C), temperature source Oral, resp. rate 16, height '5\' 10"'$  (1.778 m), weight 68.9 kg, SpO2 97 %. General appearance: alert, cooperative, appears stated age, fatigued, and no distress Resp: rales base - right Cardio: irregularly irregular rhythm, S1, S2 normal, no click, no rub, and no murmur Extremities: edema 1-2+ dependent pitting edema at level of thighs, BLE wrapped in una boot from shin to ankle.        Holley Bouche, MD 07/25/2022, 1:30 PM

## 2022-07-26 DIAGNOSIS — J90 Pleural effusion, not elsewhere classified: Secondary | ICD-10-CM | POA: Diagnosis not present

## 2022-07-26 LAB — BASIC METABOLIC PANEL
Anion gap: 6 (ref 5–15)
BUN: 29 mg/dL — ABNORMAL HIGH (ref 8–23)
CO2: 26 mmol/L (ref 22–32)
Calcium: 7.9 mg/dL — ABNORMAL LOW (ref 8.9–10.3)
Chloride: 107 mmol/L (ref 98–111)
Creatinine, Ser: 2.11 mg/dL — ABNORMAL HIGH (ref 0.61–1.24)
GFR, Estimated: 28 mL/min — ABNORMAL LOW (ref >60)
Glucose, Bld: 148 mg/dL — ABNORMAL HIGH (ref 70–99)
Potassium: 4.5 mmol/L (ref 3.5–5.1)
Sodium: 139 mmol/L (ref 135–145)

## 2022-07-26 LAB — CBC
HCT: 28.7 % — ABNORMAL LOW (ref 39.0–52.0)
Hemoglobin: 9.5 g/dL — ABNORMAL LOW (ref 13.0–17.0)
MCH: 25.2 pg — ABNORMAL LOW (ref 26.0–34.0)
MCHC: 33.1 g/dL (ref 30.0–36.0)
MCV: 76.1 fL — ABNORMAL LOW (ref 80.0–100.0)
Platelets: 168 10*3/uL (ref 150–400)
RBC: 3.77 MIL/uL — ABNORMAL LOW (ref 4.22–5.81)
RDW: 17.4 % — ABNORMAL HIGH (ref 11.5–15.5)
WBC: 9.3 10*3/uL (ref 4.0–10.5)
nRBC: 0 % (ref 0.0–0.2)

## 2022-07-26 LAB — GLUCOSE, CAPILLARY
Glucose-Capillary: 152 mg/dL — ABNORMAL HIGH (ref 70–99)
Glucose-Capillary: 135 mg/dL — ABNORMAL HIGH (ref 70–99)
Glucose-Capillary: 120 mg/dL — ABNORMAL HIGH (ref 70–99)
Glucose-Capillary: 187 mg/dL — ABNORMAL HIGH (ref 70–99)
Glucose-Capillary: 103 mg/dL — ABNORMAL HIGH (ref 70–99)

## 2022-07-26 LAB — PHOSPHORUS: Phosphorus: 3.1 mg/dL (ref 2.5–4.6)

## 2022-07-26 LAB — CYTOLOGY - NON PAP

## 2022-07-26 LAB — MAGNESIUM: Magnesium: 1.9 mg/dL (ref 1.7–2.4)

## 2022-07-26 MED ORDER — FUROSEMIDE 40 MG PO TABS
40.0000 mg | ORAL_TABLET | Freq: Every day | ORAL | Status: DC
  Administered 2022-07-27 – 2022-07-29 (×2): 40 mg via ORAL
  Filled 2022-07-26 (×3): qty 1

## 2022-07-26 NOTE — Consult Note (Addendum)
Reason for Consult:AKI Referring Physician: Dr. Fayrene Helper  Chief Complaint: AKI on CKD IIIb w/ CHF exacerbation  Assessment/Plan: AKI on CKD IIIb Baseline Cr around ~1.8 and patient d/c from last admission w/ Cr at 2.13. Patient admitted w/ Cr of 2.48 that has since come down to 2.11 today. AKI like in setting of diuresis for CHF exacerbation, as holding diuresis has improved Cr. Patient likely re-accumulated volume 2/2 non-compliance with low salt diet, and hypoalbuminemia  -Continue holding diuresis today -Consider start lasix 40 mg PO daily tomorrow, will need at least 40 mg PO lasix at d/c -No salt during admission or d/c CHF Exacerbation Patient w/ hx of CHF w/ LVEF 55-60%, RV mildly reduced systolic function and mildly elevated PA systolic pressure in [id number]. 07/07/22 LV EF 50-55% and indeterminate diastolic paramters w/ normal RV function. Patient presented w/ SOB and was found to have bilateral pleural effusions. Patient recently admitted for same issue and had 1.3 L thoracentesis on 2/20. Patient has been receiving diuresis, but pleural effusions likely 2/2 low albumin, and poor adherence to low salt diet   -Plans per primary team Pleural Effusion Noted on CXR 07/22/22. Patient s/p thoracentesis x 2 on left (1.2 L and 600 mL), and x 1 on right (850 mL)  -Plans per day team Pericardial Effusion Noted on Echo 07/07/22, moderate size, and larger than what was noted 12/18/21  -Plans per primary team Atrial Fibrillation  Patient is rate controlled on Xarelto and metoprolol succ at home, switched to Eliquis this admission  -Plans per primary team Anemia Patient Hgb 9.5 today, w/ baseline around 8-9. This may be stemming from his CKD. Will continue to monitor and consider ESA in the future. -Consider ESA Alzheimer's disease Patient w/ Hx of Alzheimer's on [place]  -Plans per primary team DM2 Patient on insulin at home. A1c 9.2  -Plans per primary team    HPI: [name] is an  89 y.o. male w/ PMH including diastolic CHF, CKD, diabetes mellitus type 2, chronic hypoxic respiratory failure on 4 L, A. Fib, and tachybradycardia syndrome s/p pacemaker, who presented to the emergency department with shortness of breath, reported hypotension by EMS, found to have bilateral pleural effusions.    Patient recently hospitalized for similar issue w/ diastolic CHF exacerbation and bilateral pleural effusion/pericardial effusion from 07/06/22-07/10/22. During last admission, patient underwent left thoracentesis on 2/20 w/ 1.3 L removed. Patient d/c home w/ 20 mg lasix PO.     Chemistry and CBC: Creat  Date/Time Value Ref Range Status  08/03/2021 04:58 PM 1.50 (H) 0.70 - 1.22 mg/dL Final  11/17/2018 02:29 PM 1.93 (H) 0.70 - 1.11 mg/dL Final    Comment:    For patients >8 years of age, the reference limit for Creatinine is approximately 13% higher for people identified as African-American. .   04/10/2018 03:28 PM 1.45 (H) 0.70 - 1.11 mg/dL Final    Comment:    For patients >75 years of age, the reference limit for Creatinine is approximately 13% higher for people identified as African-American. .   07/30/2016 10:59 AM 1.58 (H) 0.70 - 1.11 mg/dL Final    Comment:      For patients > or = 87 years of age: The upper reference limit for Creatinine is approximately 13% higher for people identified as African-American.      Creatinine, Ser  Date/Time Value Ref Range Status  07/26/2022 05:49 AM 2.11 (H) 0.61 - 1.24 mg/dL Final  07/25/2022 06:27 AM 2.22 (H) 0.61 -  1.24 mg/dL Final  07/23/2022 09:45 AM 2.54 (H) 0.61 - 1.24 mg/dL Final  07/22/2022 03:57 PM 2.48 (H) 0.61 - 1.24 mg/dL Final  07/10/2022 04:08 AM 2.13 (H) 0.61 - 1.24 mg/dL Final  07/09/2022 04:33 AM 2.03 (H) 0.61 - 1.24 mg/dL Final  07/08/2022 03:59 AM 2.05 (H) 0.61 - 1.24 mg/dL Final  07/07/2022 03:49 AM 1.92 (H) 0.61 - 1.24 mg/dL Final  07/06/2022 12:11 PM 1.79 (H) 0.61 - 1.24 mg/dL Final  05/30/2022 02:46  PM 2.08 (H) 0.40 - 1.50 mg/dL Final  03/09/2022 08:35 PM 1.84 (H) 0.61 - 1.24 mg/dL Final  02/10/2022 06:42 PM 2.19 (H) 0.61 - 1.24 mg/dL Final  02/05/2022 09:28 AM 2.30 (H) 0.61 - 1.24 mg/dL Final  02/05/2022 09:24 AM 2.26 (H) 0.61 - 1.24 mg/dL Final  01/29/2022 11:56 AM 2.06 (H) 0.76 - 1.27 mg/dL Final  01/19/2022 08:28 PM 2.37 (H) 0.61 - 1.24 mg/dL Final  01/08/2022 10:29 AM 2.02 (H) 0.76 - 1.27 mg/dL Final  01/01/2022 03:35 AM 2.23 (H) 0.61 - 1.24 mg/dL Final  12/31/2021 01:01 PM 2.36 (H) 0.61 - 1.24 mg/dL Final  12/18/2021 09:38 AM 1.95 (H) 0.76 - 1.27 mg/dL Final  12/10/2021 11:50 AM 1.86 (H) 0.76 - 1.27 mg/dL Final  12/05/2021 01:49 PM 1.91 (H) 0.61 - 1.24 mg/dL Final  11/30/2021 09:39 AM 1.93 (H) 0.40 - 1.50 mg/dL Final  08/01/2021 05:01 AM 1.63 (H) 0.61 - 1.24 mg/dL Final  07/31/2021 05:04 AM 1.62 (H) 0.61 - 1.24 mg/dL Final  07/30/2021 07:43 AM 1.35 (H) 0.61 - 1.24 mg/dL Final  07/29/2021 09:43 AM 1.70 (H) 0.61 - 1.24 mg/dL Final  04/25/2021 12:28 PM 1.53 (H) 0.40 - 1.50 mg/dL Final  10/30/2020 10:51 AM 1.72 (H) 0.40 - 1.50 mg/dL Final  07/27/2020 08:51 AM 1.36 0.40 - 1.50 mg/dL Final  05/24/2020 06:21 PM 1.67 (H) 0.61 - 1.24 mg/dL Final  05/17/2020 11:01 AM 1.77 (H) 0.40 - 1.50 mg/dL Final  03/21/2020 03:07 PM 1.56 (H) 0.40 - 1.50 mg/dL Final  01/27/2020 11:58 AM 1.69 (H) 0.40 - 1.50 mg/dL Final  01/16/2018 10:34 AM 1.54 (H) 0.40 - 1.50 mg/dL Final  12/10/2017 12:02 PM 1.42 0.40 - 1.50 mg/dL Final  12/05/2017 07:52 AM 1.69 (H) 0.61 - 1.24 mg/dL Final  12/04/2017 09:55 AM 1.70 (H) 0.61 - 1.24 mg/dL Final  12/03/2017 04:38 AM 1.79 (H) 0.61 - 1.24 mg/dL Final  12/02/2017 04:58 AM 1.74 (H) 0.61 - 1.24 mg/dL Final  12/01/2017 01:06 PM 2.03 (H) 0.61 - 1.24 mg/dL Final  10/16/2017 10:30 AM 1.44 0.40 - 1.50 mg/dL Final  09/03/2017 11:08 AM 1.55 (H) 0.40 - 1.50 mg/dL Final  01/17/2017 05:38 AM 1.41 (H) 0.61 - 1.24 mg/dL Final  01/16/2017 06:06 AM 1.38 (H) 0.61 - 1.24 mg/dL Final   01/15/2017 05:23 AM 1.62 (H) 0.61 - 1.24 mg/dL Final  01/14/2017 05:36 AM 1.83 (H) 0.61 - 1.24 mg/dL Final  01/13/2017 07:08 AM 1.79 (H) 0.61 - 1.24 mg/dL Final  01/11/2017 07:54 PM 1.65 (H) 0.61 - 1.24 mg/dL Final  11/08/2016 11:00 AM 1.65 (H) 0.76 - 1.27 mg/dL Final  11/02/2016 04:38 PM 1.48 (H) 0.61 - 1.24 mg/dL Final  08/15/2016 03:53 AM 1.41 (H) 0.61 - 1.24 mg/dL Final   Recent Labs  Lab 07/22/22 1557 07/23/22 0945 07/25/22 0627 07/26/22 0549  NA 137 138 138 139  K 4.6 4.6 4.5 4.5  CL 105 105 108 107  CO2 '22 24 23 26  '$ GLUCOSE 158* 132* 115* 148*  BUN 32* 34*  31* 29*  CREATININE 2.48* 2.54* 2.22* 2.11*  CALCIUM 7.4* 8.0* 8.0* 7.9*  PHOS  --   --  3.3 3.1    Recent Labs  Lab 07/22/22 1557 07/23/22 0945 07/25/22 0627 07/26/22 0549  WBC 8.6 6.5 6.3 9.3  NEUTROABS 6.2 4.7 4.5  --   HGB 9.0* 9.8* 9.1* 9.5*  HCT 27.4* 28.5* 27.6* 28.7*  MCV 77.8* 74.4* 75.6* 76.1*  PLT 182 192 164 168    Liver Function Tests: Recent Labs  Lab 07/22/22 1557 07/23/22 0945 07/25/22 0627  AST 17 14* 14*  ALT '8 8 9  '$ ALKPHOS 47 58 44  BILITOT 0.3 0.7 0.6  PROT 5.2* 5.8* 5.2*  ALBUMIN 2.4* 2.7* 2.7*    No results for input(s): "LIPASE", "AMYLASE" in the last 168 hours. No results for input(s): "AMMONIA" in the last 168 hours. Cardiac Enzymes: No results for input(s): "CKTOTAL", "CKMB", "CKMBINDEX", "TROPONINI" in the last 168 hours. Iron Studies: No results for input(s): "IRON", "TIBC", "TRANSFERRIN", "FERRITIN" in the last 72 hours. PT/INR: '@LABRCNTIP'$ (inr:5)  Xrays/Other Studies: ) Results for orders placed or performed during the hospital encounter of 07/22/22 (from the past 48 hour(s))  Glucose, capillary     Status: Abnormal   Collection Time: 07/24/22 12:28 PM  Result Value Ref Range   Glucose-Capillary 121 (H) 70 - 99 mg/dL    Comment: Glucose reference range applies only to samples taken after fasting for at least 8 hours.  Lactate dehydrogenase     Status: None    Collection Time: 07/24/22  3:01 PM  Result Value Ref Range   LDH 144 98 - 192 U/L    Comment: Performed at [facility], [place] [address] [place], [place] [address]  Lactate dehydrogenase (pleural or peritoneal fluid)     Status: Abnormal   Collection Time: 07/24/22  4:34 PM  Result Value Ref Range   LD, Fluid 122 (H) 3 - 23 U/L    Comment: (NOTE) Results should be evaluated in conjunction with serum values    Fluid Type-FLDH PLEURAL     Comment: LEFT FLUID Performed at [facility] [address], [place], [place] [address] CORRECTED ON 03/06 AT 1910: PREVIOUSLY REPORTED AS Lung, Left   Body fluid cell count with differential     Status: Abnormal   Collection Time: 07/24/22  4:34 PM  Result Value Ref Range   Fluid Type-FCT PLEURAL     Comment: LEFT FLUID CORRECTED ON 03/06 AT 1910: PREVIOUSLY REPORTED AS Lung, Left    Color, Fluid YELLOW YELLOW   Appearance, Fluid HAZY (A) CLEAR   Total Nucleated Cell Count, Fluid 490 0 - 1,000 cu mm   Neutrophil Count, Fluid 57 (H) 0 - 25 %   Lymphs, Fluid 31 %   Monocyte-Macrophage-Serous Fluid 12 (L) 50 - 90 %   Eos, Fluid 0 %    Comment: Performed at [facility] [address], [place], [place] [address]  Protein, pleural or peritoneal fluid     Status: None   Collection Time: 07/24/22  4:34 PM  Result Value Ref Range   Total protein, fluid <3.0 g/dL    Comment: (NOTE) No normal range established for this test Results should be evaluated in conjunction with serum values    Fluid Type-FTP PLEURAL     Comment: LEFT FLUID Performed at [facility] [address] [place], [place] [address] CORRECTED ON 03/06 AT 1910: PREVIOUSLY REPORTED AS Lung, Left  Culture, body fluid w Gram Stain-bottle     Status: Abnormal (Preliminary result)   Collection Time: 07/24/22  4:34 PM   Specimen: Fluid  Result Value Ref Range   Specimen Description FLUID PLEURAL LEFT    Special Requests BOTTLES DRAWN AEROBIC AND  ANAEROBIC    Gram Stain      GRAM POSITIVE COCCI IN CLUSTERS ANAEROBIC BOTTLE ONLY CRITICAL RESULT CALLED TO, READ BACK BY AND VERIFIED WITH: RN Virgil Benedict ON 07/25/22 @ 1424 BY DRT    Culture (A)     STAPHYLOCOCCUS HOMINIS SUSCEPTIBILITIES TO FOLLOW Performed at [facility], [place] [address], [place], [place] [address]    Report Status PENDING   Gram stain     Status: None   Collection Time: 07/24/22  4:34 PM   Specimen: Fluid  Result Value Ref Range   Specimen Description FLUID PLEURAL LEFT    Special Requests NONE    Gram Stain      WBC PRESENT, PREDOMINANTLY PMN NO ORGANISMS SEEN CYTOSPIN SMEAR Performed at [facility], [place] [address], [place], [place] [address]    Report Status 07/24/2022 FINAL   Glucose, capillary     Status: Abnormal   Collection Time: 07/24/22  5:02 PM  Result Value Ref Range   Glucose-Capillary 210 (H) 70 - 99 mg/dL    Comment: Glucose reference range applies only to samples taken after fasting for at least 8 hours.  Glucose, capillary     Status: Abnormal   Collection Time: 07/24/22  8:43 PM  Result Value Ref Range   Glucose-Capillary 153 (H) 70 - 99 mg/dL    Comment: Glucose reference range applies only to samples taken after fasting for at least 8 hours.  Brain natriuretic peptide     Status: Abnormal   Collection Time: 07/25/22  6:27 AM  Result Value Ref Range   B Natriuretic Peptide 535.3 (H) 0.0 - 100.0 pg/mL    Comment: Performed at [facility] [address], [place], [place] [address]  CBC with Differential/Platelet     Status: Abnormal   Collection Time: 07/25/22  6:27 AM  Result Value Ref Range   WBC 6.3 4.0 - 10.5 K/uL   RBC 3.65 (L) 4.22 - 5.81 MIL/uL   Hemoglobin 9.1 (L) 13.0 - 17.0 g/dL   HCT 27.6 (L) 39.0 - 52.0 %   MCV 75.6 (L) 80.0 - 100.0 fL   MCH 24.9 (L) 26.0 - 34.0 pg   MCHC 33.0 30.0 - 36.0 g/dL   RDW 17.2 (H) 11.5 - 15.5 %   Platelets 164 150 - 400 K/uL   nRBC 0.0 0.0 - 0.2 %   Neutrophils  Relative % 70 %   Neutro Abs 4.5 1.7 - 7.7 K/uL   Lymphocytes Relative 16 %   Lymphs Abs 1.0 0.7 - 4.0 K/uL   Monocytes Relative 12 %   Monocytes Absolute 0.7 0.1 - 1.0 K/uL   Eosinophils Relative 1 %   Eosinophils Absolute 0.1 0.0 - 0.5 K/uL   Basophils Relative 0 %   Basophils Absolute 0.0 0.0 - 0.1 K/uL   Immature Granulocytes 1 %   Abs Immature Granulocytes 0.04 0.00 - 0.07 K/uL    Comment: Performed at [facility] [address] [place], [place] [address]  Comprehensive metabolic panel     Status: Abnormal   Collection Time: 07/25/22  6:27 AM  Result Value Ref Range   Sodium 138 135 - 145  mmol/L   Potassium 4.5 3.5 - 5.1 mmol/L   Chloride 108 98 - 111 mmol/L   CO2 23 22 - 32 mmol/L   Glucose, Bld 115 (H) 70 - 99 mg/dL    Comment: Glucose reference range applies only to samples taken after fasting for at least 8 hours.   BUN 31 (H) 8 - 23 mg/dL   Creatinine, Ser 2.22 (H) 0.61 - 1.24 mg/dL   Calcium 8.0 (L) 8.9 - 10.3 mg/dL   Total Protein 5.2 (L) 6.5 - 8.1 g/dL   Albumin 2.7 (L) 3.5 - 5.0 g/dL   AST 14 (L) 15 - 41 U/L   ALT 9 0 - 44 U/L   Alkaline Phosphatase 44 38 - 126 U/L   Total Bilirubin 0.6 0.3 - 1.2 mg/dL   GFR, Estimated 26 (L) >60 mL/min    Comment: (NOTE) Calculated using the CKD-EPI Creatinine Equation (2021)    Anion gap 7 5 - 15    Comment: Performed at [facility], [place] [address] [place], [place] [address]  Magnesium     Status: None   Collection Time: 07/25/22  6:27 AM  Result Value Ref Range   Magnesium 2.0 1.7 - 2.4 mg/dL    Comment: Performed at [facility] [address], [place], [place] [address]  Phosphorus     Status: None   Collection Time: 07/25/22  6:27 AM  Result Value Ref Range   Phosphorus 3.3 2.5 - 4.6 mg/dL    Comment: Performed at [facility] [address], [place], [place] [address]  Glucose, capillary     Status: Abnormal   Collection Time: 07/25/22  7:33 AM  Result Value Ref Range    Glucose-Capillary 120 (H) 70 - 99 mg/dL    Comment: Glucose reference range applies only to samples taken after fasting for at least 8 hours.  Glucose, capillary     Status: Abnormal   Collection Time: 07/25/22 12:15 PM  Result Value Ref Range   Glucose-Capillary 188 (H) 70 - 99 mg/dL    Comment: Glucose reference range applies only to samples taken after fasting for at least 8 hours.  Lactate dehydrogenase (pleural or peritoneal fluid)     Status: Abnormal   Collection Time: 07/25/22  1:11 PM  Result Value Ref Range   LD, Fluid 54 (H) 3 - 23 U/L    Comment: (NOTE) Results should be evaluated in conjunction with serum values    Fluid Type-FLDH PLEURAL     Comment: RIGHT FLUID Performed at [facility] [address] [place], [place] [address] CORRECTED ON 03/07 AT 1438: PREVIOUSLY REPORTED AS Lung, Right   Body fluid cell count with differential     Status: Abnormal   Collection Time: 07/25/22  1:11 PM  Result Value Ref Range   Fluid Type-FCT PLEURAL     Comment: RIGHT FLUID CORRECTED ON 03/07 AT 1438: PREVIOUSLY REPORTED AS Lung, Right    Color, Fluid STRAW (A) YELLOW   Appearance, Fluid CLEAR (A) CLEAR   Total Nucleated Cell Count, Fluid 292 0 - 1,000 cu mm   Neutrophil Count, Fluid 10 0 - 25 %   Lymphs, Fluid 63 %   Monocyte-Macrophage-Serous Fluid 27 (L) 50 - 90 %   Eos, Fluid 0 %   Other Cells, Fluid OTHER CELLS IDENTIFIED AS MESOTHELIAL CELLS %    Comment: Performed at [facility], [place] [address], [place],  [address]  Protein,  pleural or peritoneal fluid     Status: None   Collection Time: 07/25/22  1:11 PM  Result Value Ref Range   Total protein, fluid <3.0 g/dL    Comment: (NOTE) No normal range established for this test Results should be evaluated in conjunction with serum values    Fluid Type-FTP PLEURAL     Comment: RIGHT FLUID Performed at [facility] [address] [place], [place] [address] CORRECTED ON 03/07 AT 1438:  PREVIOUSLY REPORTED AS Lung, Right   Glucose, capillary     Status: Abnormal   Collection Time: 07/25/22  4:21 PM  Result Value Ref Range   Glucose-Capillary 158 (H) 70 - 99 mg/dL    Comment: Glucose reference range applies only to samples taken after fasting for at least 8 hours.  Glucose, capillary     Status: Abnormal   Collection Time: 07/26/22  4:41 AM  Result Value Ref Range   Glucose-Capillary 152 (H) 70 - 99 mg/dL    Comment: Glucose reference range applies only to samples taken after fasting for at least 8 hours.  Basic metabolic panel     Status: Abnormal   Collection Time: 07/26/22  5:49 AM  Result Value Ref Range   Sodium 139 135 - 145 mmol/L   Potassium 4.5 3.5 - 5.1 mmol/L   Chloride 107 98 - 111 mmol/L   CO2 26 22 - 32 mmol/L   Glucose, Bld 148 (H) 70 - 99 mg/dL    Comment: Glucose reference range applies only to samples taken after fasting for at least 8 hours.   BUN 29 (H) 8 - 23 mg/dL   Creatinine, Ser 2.11 (H) 0.61 - 1.24 mg/dL   Calcium 7.9 (L) 8.9 - 10.3 mg/dL   GFR, Estimated 28 (L) >60 mL/min    Comment: (NOTE) Calculated using the CKD-EPI Creatinine Equation (2021)    Anion gap 6 5 - 15    Comment: Performed at [facility] [address], [place], [place] [address]  CBC     Status: Abnormal   Collection Time: 07/26/22  5:49 AM  Result Value Ref Range   WBC 9.3 4.0 - 10.5 K/uL   RBC 3.77 (L) 4.22 - 5.81 MIL/uL   Hemoglobin 9.5 (L) 13.0 - 17.0 g/dL   HCT 28.7 (L) 39.0 - 52.0 %   MCV 76.1 (L) 80.0 - 100.0 fL   MCH 25.2 (L) 26.0 - 34.0 pg   MCHC 33.1 30.0 - 36.0 g/dL   RDW 17.4 (H) 11.5 - 15.5 %   Platelets 168 150 - 400 K/uL   nRBC 0.0 0.0 - 0.2 %    Comment: Performed at [facility], [place] [address], [place], [place] [address]  Magnesium     Status: None   Collection Time: 07/26/22  5:49 AM  Result Value Ref Range   Magnesium 1.9 1.7 - 2.4 mg/dL    Comment: Performed at [facility] [address] [place],  [address]   Phosphorus     Status: None   Collection Time: 07/26/22  5:49 AM  Result Value Ref Range   Phosphorus 3.1 2.5 - 4.6 mg/dL    Comment: Performed at [facility] [address], [place], [place] [address]  Glucose, capillary     Status: Abnormal   Collection Time: 07/26/22  8:03 AM  Result Value Ref Range   Glucose-Capillary 135 (H) 70 - 99 mg/dL    Comment: Glucose  reference range applies only to samples taken after fasting for at least 8 hours.  Glucose, capillary     Status: Abnormal   Collection Time: 07/26/22 11:10 AM  Result Value Ref Range   Glucose-Capillary 120 (H) 70 - 99 mg/dL    Comment: Glucose reference range applies only to samples taken after fasting for at least 8 hours.   IR THORACENTESIS ASP PLEURAL SPACE W/IMG GUIDE  Result Date: 07/25/2022 INDICATION: Diastolic heart failure with recurrent right pleural effusion. Request for diagnostic and therapeutic thoracentesis. EXAM: ULTRASOUND GUIDED RIGHT THORACENTESIS MEDICATIONS: 1% lidocaine 10 mL COMPLICATIONS: None immediate. PROCEDURE: An ultrasound guided thoracentesis was thoroughly discussed with the patient and questions answered. The benefits, risks, alternatives and complications were also discussed. The patient understands and wishes to proceed with the procedure. Written consent was obtained. Ultrasound was performed to localize and mark an adequate pocket of fluid in the right chest. The area was then prepped and draped in the normal sterile fashion. 1% Lidocaine was used for local anesthesia. Under ultrasound guidance a 6 Fr Safe-T-Centesis catheter was introduced. Thoracentesis was performed. The catheter was removed and a dressing applied. FINDINGS: A total of approximately 850 mL of clear yellow fluid was removed. Samples were sent to the laboratory as requested by the clinical team. IMPRESSION: Successful ultrasound guided right thoracentesis yielding 850 mL of pleural fluid. No pneumothorax on post-procedure  chest x-ray. Procedure performed by: Gareth Eagle, PA-C Electronically Signed   By: Ruthann Cancer M.D.   On: 07/25/2022 13:47   DG Chest 1 View  Result Date: 07/25/2022 CLINICAL DATA:  Status post right thoracentesis EXAM: CHEST  1 VIEW COMPARISON:  Previous studies including the examination of 07/24/2022 FINDINGS: Transverse diameter of heart is increased. There is significant interval decrease in right pleural effusion after thoracentesis. Right lateral CP angle is clear. There is no pneumothorax. There are linear densities in both lower lung fields. There is blunting of left lateral CP angle. Pacemaker battery is seen in left infraclavicular region. IMPRESSION: There is significant interval decrease in right pleural effusion. There is no pneumothorax. Small patchy infiltrates in both lower lung fields may suggest atelectasis. Small left pleural effusion. Electronically Signed   By: Elmer Picker M.D.   On: 07/25/2022 13:26   IR THORACENTESIS ASP PLEURAL SPACE W/IMG GUIDE  Result Date: 07/24/2022 INDICATION: Patient with history of recurrent left pleural effusion. Request for repeat left thoracentesis. EXAM: ULTRASOUND GUIDED LEFT THORACENTESIS MEDICATIONS: 10 mL 1% lidocaine COMPLICATIONS: None immediate. PROCEDURE: An ultrasound guided thoracentesis was thoroughly discussed with the patient and questions answered. The benefits, risks, alternatives and complications were also discussed. The patient understands and wishes to proceed with the procedure. Written consent was obtained. Ultrasound was performed to localize and mark an adequate pocket of fluid in the left chest. The area was then prepped and draped in the normal sterile fashion. 1% Lidocaine was used for local anesthesia. Under ultrasound guidance a 6 Fr Safe-T-Centesis catheter was introduced. Thoracentesis was performed. The catheter was removed and a dressing applied. FINDINGS: A total of approximately 600 mL of yellow fluid was removed.  Samples were sent to the laboratory as requested by the clinical team. IMPRESSION: Successful ultrasound guided left thoracentesis yielding 600 mL of pleural fluid. Read by: Brynda Greathouse PA-C Electronically Signed   By: Ruthann Cancer M.D.   On: 07/24/2022 16:26   DG Chest Port 1 View  Result Date: 07/24/2022 CLINICAL DATA:  Pleural effusion. EXAM: PORTABLE CHEST 1 VIEW COMPARISON:  July 23, 2022. FINDINGS: Enlarged cardiac silhouette. Left subclavian approach cardiac rhythm maintenance device. Layering bilateral pleural effusions. No visible pneumothorax. Polyarticular degenerative change. IMPRESSION: Layering bilateral pleural effusions without visible pneumothorax. Electronically Signed   By: Margaretha Sheffield M.D.   On: 07/24/2022 16:20    PMH:   Past Medical History:  Diagnosis Date   Acute CVA (cerebrovascular accident) ([facility])    Arthritis    Atrial fibrillation ([facility])    CKD (chronic kidney disease)    History of hiatal hernia    Hx SBO    Last 2013 all treated conservatively   Hypertension    Presence of permanent cardiac pacemaker    Prostate cancer ([facility])    Stroke ([facility]) 2017   left facial drooping noted 08/14/2016   Tachycardia-bradycardia syndrome ([facility])    Type II diabetes mellitus ([facility])    Ventral hernia    x3    PSH:   Past Surgical History:  Procedure Laterality Date   ABDOMINAL AORTOGRAM W/LOWER EXTREMITY N/A 08/07/2016   Procedure: Abdominal Aortogram w/Lower Extremity;  Surgeon: Wellington Hampshire, MD;  Location: [facility];  Service: Cardiovascular;  Laterality: N/A;   CHOLECYSTECTOMY OPEN  03/31/2001   Dr Lindon Romp   COLON SURGERY     EXPLORATORY LAPAROTOMY  08/2004   Archie Endo 10/01/2010   HEMORRHOID SURGERY     HERNIA REPAIR     HIATAL HERNIA REPAIR  2002   INCISIONAL HERNIA REPAIR  08/2004   Archie Endo 10/01/2010   INSERT / REPLACE / REMOVE PACEMAKER     IR THORACENTESIS ASP PLEURAL SPACE W/IMG GUIDE  07/23/2022   IR THORACENTESIS ASP PLEURAL SPACE W/IMG GUIDE   07/24/2022   IR THORACENTESIS ASP PLEURAL SPACE W/IMG GUIDE  07/25/2022   PERIPHERAL VASCULAR BALLOON ANGIOPLASTY  08/07/2016   Procedure: Peripheral Vascular Balloon Angioplasty;  Surgeon: Wellington Hampshire, MD;  Location: [facility];  Service: Cardiovascular;;  left popliteal artery   PERIPHERAL VASCULAR INTERVENTION  08/14/2016   popliteal artery/notes 08/14/2016   PERIPHERAL VASCULAR INTERVENTION Left 08/14/2016   Procedure: Peripheral Vascular Intervention;  Surgeon: Wellington Hampshire, MD;  Location: [facility];  Service: Cardiovascular;  Laterality: Left;  popliteal artery   PERMANENT PACEMAKER GENERATOR CHANGE N/A 04/06/2012   Procedure: PERMANENT PACEMAKER GENERATOR CHANGE;  Surgeon: Evans Lance, MD;  Location: [facility];  Service: Cardiovascular;  Laterality: N/A;   PPM GENERATOR CHANGEOUT N/A 02/04/2022   Procedure: PPM GENERATOR CHANGEOUT;  Surgeon: Evans Lance, MD;  Location: [facility];  Service: Cardiovascular;  Laterality: N/A;   SMALL INTESTINE SURGERY  09/16/2004   SBO resection, VH repair    Allergies: No Known Allergies  Medications:   Prior to Admission medications   Medication Sig Start Date End Date Taking? Authorizing Provider  divalproex (DEPAKOTE SPRINKLE) 125 MG capsule Take 1 capsule (125 mg total) by mouth 2 (two) times daily. 05/27/22 07/22/23 Yes [name], MD  docusate sodium (COLACE) 100 MG capsule Take 1 capsule (100 mg total) by mouth daily. 07/10/22  Yes Amin, Ankit Chirag, MD  finasteride (PROSCAR) 5 MG tablet TAKE 1 TABLET (5 MG TOTAL) BY MOUTH DAILY. 06/07/22  Yes [name], MD  furosemide (LASIX) 20 MG tablet Take 1 tablet (20 mg total) by mouth daily. 07/10/22  Yes Amin, Jeanella Flattery, MD  Iron, Ferrous Sulfate, 325 (65 Fe) MG TABS Take 1 tablet every other day. Patient taking differently: Take 1 tablet by mouth every other  day. 06/13/22  Yes [name], MD  latanoprost (XALATAN) 0.005 % ophthalmic  solution Place 1 drop into both eyes at bedtime. 05/03/20  Yes [provider]  loratadine (CLARITIN) 10 MG tablet Take 10 mg by mouth daily as needed for rhinitis.   Yes [provider]  Melatonin 3 MG TBDP Take 1 tablet by mouth at bedtime. Patient taking differently: Take 3 mg by mouth at bedtime. 07/10/22  Yes Amin, Ankit Chirag, MD  memantine (NAMENDA) 10 MG tablet Take 10 mg by mouth 2 (two) times daily.   Yes [provider]  metoprolol succinate (TOPROL-XL) 25 MG 24 hr tablet Take 1 tablet (25 mg total) by mouth daily. 01/02/22  Yes Vann, Jessica U, DO  pantoprazole (PROTONIX) 40 MG tablet TAKE 1 TABLET(40 MG) BY MOUTH DAILY Patient taking differently: Take 40 mg by mouth daily. 01/01/22  Yes [name], MD  pioglitazone (ACTOS) 15 MG tablet Take 1 tablet (15 mg total) by mouth daily. 02/14/22  Yes [name], MD  Rivaroxaban (XARELTO) 15 MG TABS tablet TAKE 1 TABLET DAILY WITH SUPPER Patient taking differently: Take 15 mg by mouth daily with supper. 05/22/22  Yes [name], MD  tamsulosin (FLOMAX) 0.4 MG CAPS capsule Take 1 capsule (0.4 mg total) by mouth daily. 12/04/21  Yes [name], MD  polyethylene glycol powder [facility]) 17 GM/SCOOP powder Take 17 g by mouth 2 (two) times daily as needed. Patient not taking: Reported on 07/22/2022 08/03/21   Charyl Dancer, NP  traZODone (DESYREL) 50 MG tablet Take 1 tablet (50 mg total) by mouth at bedtime as needed for sleep. Patient not taking: Reported on 07/22/2022 07/10/22   Damita Lack, MD    Discontinued Meds:   Medications Discontinued During This Encounter  Medication Reason   Rivaroxaban (XARELTO) tablet 15 mg    furosemide (LASIX) injection 20 mg     Social History:  reports that he quit smoking about 67 years ago. His smoking use included cigarettes. He has a 22.50 pack-year smoking history. He has been exposed to tobacco smoke. He quit smokeless  tobacco use about 69 years ago.  His smokeless tobacco use included chew. He reports that he does not drink alcohol and does not use drugs.  Family History:   Family History  Problem Relation Age of Onset   Pneumonia Father    Stroke Brother    Stroke Sister    Cancer Brother        type unknown    Blood pressure 102/67, pulse 96, temperature 98.3 F (36.8 C), resp. rate 16, height '5\' 10"'$  (1.778 m), weight 68.9 kg, SpO2 100 %. General appearance: alert, cooperative, appears stated age, fatigued, and no distress Resp: rales base - right Cardio: irregularly irregular rhythm, S1, S2 normal, no click, no rub, and no murmur GI: normal findings: bowel sounds normal and soft, non-tender and abnormal findings:  diastasis recti and w/ multiple reducible hernia Extremities: edema 1-2+ dependent pitting edema at level of thighs, BLE wrapped in una boot from shin to ankle.        Holley Bouche, MD 07/26/2022, 11:38 AM

## 2022-07-26 NOTE — Progress Notes (Signed)
Mobility Specialist - Progress Note   07/26/22 1216  Mobility  Activity Ambulated with assistance in room  Level of Assistance Minimal assist, patient does 75% or more  Assistive Device Front wheel walker  Distance Ambulated (ft) 10 ft  Activity Response Tolerated well  Mobility Referral Yes  $Mobility charge 1 Mobility   Pt was received in bed and agreeable to mobility. No complaints throughout session. Pt was left I chair with all needs met.  [name]  Mobility Specialist Please contact via Solicitor or Rehab office at [phone]

## 2022-07-26 NOTE — Progress Notes (Signed)
Mobility Specialist - Progress Note   07/26/22 1423  Mobility  Activity Ambulated with assistance in room;Ambulated independently in room  Level of Assistance Contact guard assist, steadying assist  Assistive Device Front wheel walker  Distance Ambulated (ft) 20 ft  Activity Response Tolerated well  Mobility Referral Yes  $Mobility charge 1 Mobility   Pt was received existing BR and washing hand. Pt stated he had called someone to take him to BR but no one ever came so he went by himself. Pt was educated on importance of waiting for staff member. Pt was left in chair with all needs met and chair alarm on.   [name]  Mobility Specialist Please contact via Solicitor or Rehab office at [phone]

## 2022-07-26 NOTE — Progress Notes (Signed)
Confirmed with pt's granddtr plan is for dc to [facility] at dc. [employer] auth request submitted for [facility] ambulance transport/[facility]. SW will provide updates as available.   Wandra Feinstein, MSW, LCSW [phone] (coverage)

## 2022-07-26 NOTE — Progress Notes (Signed)
PROGRESS NOTE    [name]  [id number] DOB: [birth year] DOA: 07/22/2022 PCP: [name], MD  Chief Complaint  Patient presents with   Shortness of Breath    Brief Narrative:   89 year old man PMH including diastolic CHF, CKD, diabetes mellitus type 2, chronic hypoxic respiratory failure on 4 L presented to the emergency department with shortness of breath, reported hypotension by EMS.  Recently hospitalized and underwent left thoracentesis.  PMH includes bilateral pleural effusions.  Admitted for shortness of breath and treatment for pleural effusion.  Lives at home with granddaughter has 24-hour nursing.    Assessment & Plan:   Principal Problem:   Bilateral pleural effusion Active Problems:   Edema due to hypoalbuminemia   Type 2 diabetes mellitus without complication, without long-term current use of insulin ([name])   PPM-Medtronic   Chronic diastolic CHF (congestive heart failure) ([name])   Stage 3b chronic kidney disease ([name])   Essential hypertension   Alzheimer disease ([place])   DNR (do not resuscitate)/DNI(Do Not Intubate)   Chronic hypoxic respiratory failure, on home oxygen therapy ([name]) - 4 L/min  Heart Failure Exacerbation Bilateral pleural effusion Chronic hypoxic respiratory failure, on home oxygen therapy ([name]) - 4 L/min Hypoalbuminemia  - currently on 3 L by [place] Last thoracentesis 2/20 on the left.  Appears transudative by lights.  Cytology without malignant cells.  Sent home on oral Lasix 20 mg daily.   S/p L thoracentesis with 1.2 L of clear yellow fluid removed on 3/5 (no labs sent) Had repeat L thora 3/6 with additional 600 cc out (follow thora labs - pleural fluid LDH suggests exudative) Pending R thoracentesis today (follow labs) Suspect bilateral effusions related to diastolic HF - he does have low albumin as well which likely contributing S/p lasix 20 mg x2 Planning 40 mg lasix PO daily at discharge   AKI on Stage 3b chronic kidney  disease ([name]) Creatinine Regina Ganci bit worse on admission 2.48 with baseline perhaps around 1.8 - 2.0  S/p lasix.  Albumin May be difficult to manage edema and CKD - will ask for renal assistance - recommending lasix 40 mg PO daily, will transition to this at discharge  Pericardial Effusion - noted 2/18 echo, moderate pericardial effusion, larger than 12/2021 effusion - follow with diuresis  Alzheimer disease ([place]) Appears stable.  Continue Namenda.   Essential hypertension Stable.  Continue Toprol-XL 25 mg   Atrial Fibrillation - xarelto on hold, will transition to eliquis given his CKD   PPM-Medtronic   Type 2 diabetes mellitus without complication, without long-term current use of insulin ([name]) CBG stable.  Continue SSI.  will recommend stopping actos at discharge, since will plan for d/c to SNF, will likely d/c with SSI   DNR (do not resuscitate)/DNI(Do Not Intubate)     DVT prophylaxis: eliquis Code Status: DNR Family Communication: family friend at bedside Disposition:   Status is: Inpatient Remains inpatient appropriate because: pending thoracentesis and additional [place] conversations   Consultants:  IR  Procedures:  Left thoracentesis IMPRESSION: Successful ultrasound guided left thoracentesis yielding 1.2 L of pleural fluid.  Antimicrobials:  Anti-infectives (From admission, onward)    None       Subjective: No complaints Tells me coffee should be boiling hot, he's disappointed his is only warm  Objective: Vitals:   07/24/22 1705 07/24/22 2049 07/25/22 0438 07/25/22 0733  BP: 114/61 115/84 100/63 97/71  Pulse: 83 87 90 77  Resp: '17 17 16   '$ Temp: 97.8 F (36.6  C) 97.7 F (36.5 C) 98.5 F (36.9 C) 97.7 F (36.5 C)  TempSrc: Oral Oral  Oral  SpO2: 100% 100% 100% 97%  Weight:      Height:        Intake/Output Summary (Last 24 hours) at 07/25/2022 1241 Last data filed at 07/24/2022 2040 Gross per 24 hour  Intake 120 ml  Output --  Net 120 ml    Filed Weights   07/23/22 0635  Weight: 68.9 kg    Examination:  General: No acute distress.  Sitting up eating breakfast. Cardiovascular: RRR Lungs: unlabored Neurological: Alert and oriented 3. Moves all extremities 4 with equal strength. Cranial nerves II through XII grossly intact. Extremities: unna boots in place  Data Reviewed: I have personally reviewed following labs and imaging studies  CBC: Recent Labs  Lab 07/22/22 1557 07/23/22 0945 07/25/22 0627  WBC 8.6 6.5 6.3  NEUTROABS 6.2 4.7 4.5  HGB 9.0* 9.8* 9.1*  HCT 27.4* 28.5* 27.6*  MCV 77.8* 74.4* 75.6*  PLT 182 192 [id number]    Basic Metabolic Panel: Recent Labs  Lab 07/22/22 1557 07/23/22 0945 07/25/22 0627  NA 137 138 138  K 4.6 4.6 4.5  CL 105 105 108  CO2 '22 24 23  '$ GLUCOSE 158* 132* 115*  BUN 32* 34* 31*  CREATININE 2.48* 2.54* 2.22*  CALCIUM 7.4* 8.0* 8.0*  MG  --  2.0 2.0  PHOS  --   --  3.3    GFR: Estimated Creatinine Clearance: 19 mL/min (Kenslee Achorn) (by C-G formula based on SCr of 2.22 mg/dL (H)).  Liver Function Tests: Recent Labs  Lab 07/22/22 1557 07/23/22 0945 07/25/22 0627  AST 17 14* 14*  ALT '8 8 9  '$ ALKPHOS 47 58 44  BILITOT 0.3 0.7 0.6  PROT 5.2* 5.8* 5.2*  ALBUMIN 2.4* 2.7* 2.7*    CBG: Recent Labs  Lab 07/24/22 1228 07/24/22 1702 07/24/22 2043 07/25/22 0733 07/25/22 1215  GLUCAP 121* 210* 153* 120* 188*     Recent Results (from the past 240 hour(s))  Resp panel by RT-PCR (RSV, Flu Monita Swier&B, Covid) Anterior Nasal Swab     Status: None   Collection Time: 07/22/22  3:58 PM   Specimen: Anterior Nasal Swab  Result Value Ref Range Status   SARS Coronavirus 2 by RT PCR NEGATIVE NEGATIVE Final   Influenza Tacy Chavis by PCR NEGATIVE NEGATIVE Final   Influenza B by PCR NEGATIVE NEGATIVE Final    Comment: (NOTE) The Xpert Xpress SARS-CoV-2/FLU/RSV plus assay is intended as an aid in the diagnosis of influenza from Nasopharyngeal swab specimens and should not be used as Kynnadi Dicenso sole basis for  treatment. Nasal washings and aspirates are unacceptable for Xpert Xpress SARS-CoV-2/FLU/RSV testing.  Fact Sheet for Patients: EntrepreneurPulse.com.au  Fact Sheet for Healthcare Providers: IncredibleEmployment.be  This test is not yet approved or cleared by the [place] FDA and has been authorized for detection and/or diagnosis of SARS-CoV-2 by FDA under an Emergency Use Authorization (EUA). This EUA will remain in effect (meaning this test can be used) for the duration of the COVID-19 declaration under Section 564(b)(1) of the Act, 21 U.S.C. section 360bbb-3(b)(1), unless the authorization is terminated or revoked.     Resp Syncytial Virus by PCR NEGATIVE NEGATIVE Final    Comment: (NOTE) Fact Sheet for Patients: EntrepreneurPulse.com.au  Fact Sheet for Healthcare Providers: IncredibleEmployment.be  This test is not yet approved or cleared by the [place] FDA and has been authorized for detection and/or diagnosis of SARS-CoV-2 by  FDA under an Emergency Use Authorization (EUA). This EUA will remain in effect (meaning this test can be used) for the duration of the COVID-19 declaration under Section 564(b)(1) of the Act, 21 U.S.C. section 360bbb-3(b)(1), unless the authorization is terminated or revoked.  Performed at [facility], [place] [address] [place], [place] [address]   Culture, body fluid w Gram Stain-bottle     Status: None (Preliminary result)   Collection Time: 07/24/22  4:34 PM   Specimen: Fluid  Result Value Ref Range Status   Specimen Description FLUID PLEURAL LEFT  Final   Special Requests BOTTLES DRAWN AEROBIC AND ANAEROBIC  Final   Culture   Final    NO GROWTH < 24 HOURS Performed at [facility], [place] [address] [place], [place] [address]    Report Status PENDING  Incomplete  Gram stain     Status: None   Collection Time: 07/24/22  4:34 PM   Specimen: Fluid   Result Value Ref Range Status   Specimen Description FLUID PLEURAL LEFT  Final   Special Requests NONE  Final   Gram Stain   Final    WBC PRESENT, PREDOMINANTLY PMN NO ORGANISMS SEEN CYTOSPIN SMEAR Performed at [facility], [place] [address] [place],  [address]    Report Status 07/24/2022 FINAL  Final         Radiology Studies: IR THORACENTESIS ASP PLEURAL SPACE W/IMG GUIDE  Result Date: 07/24/2022 INDICATION: Patient with history of recurrent left pleural effusion. Request for repeat left thoracentesis. EXAM: ULTRASOUND GUIDED LEFT THORACENTESIS MEDICATIONS: 10 mL 1% lidocaine COMPLICATIONS: None immediate. PROCEDURE: An ultrasound guided thoracentesis was thoroughly discussed with the patient and questions answered. The benefits, risks, alternatives and complications were also discussed. The patient understands and wishes to proceed with the procedure. Written consent was obtained. Ultrasound was performed to localize and mark an adequate pocket of fluid in the left chest. The area was then prepped and draped in the normal sterile fashion. 1% Lidocaine was used for local anesthesia. Under ultrasound guidance Zechariah Bissonnette 6 Fr Safe-T-Centesis catheter was introduced. Thoracentesis was performed. The catheter was removed and Anastasha Ortez dressing applied. FINDINGS: Dayvin Aber total of approximately 600 mL of yellow fluid was removed. Samples were sent to the laboratory as requested by the clinical team. IMPRESSION: Successful ultrasound guided left thoracentesis yielding 600 mL of pleural fluid. Read by: Brynda Greathouse PA-C Electronically Signed   By: Ruthann Cancer M.D.   On: 07/24/2022 16:26   DG Chest Port 1 View  Result Date: 07/24/2022 CLINICAL DATA:  Pleural effusion. EXAM: PORTABLE CHEST 1 VIEW COMPARISON:  July 23, 2022. FINDINGS: Enlarged cardiac silhouette. Left subclavian approach cardiac rhythm maintenance device. Layering bilateral pleural effusions. No visible pneumothorax. Polyarticular  degenerative change. IMPRESSION: Layering bilateral pleural effusions without visible pneumothorax. Electronically Signed   By: Margaretha Sheffield M.D.   On: 07/24/2022 16:20        Scheduled Meds:  feeding supplement (GLUCERNA SHAKE)  237 mL Oral TID BM   finasteride  5 mg Oral Daily   insulin aspart  0-5 Units Subcutaneous QHS   insulin aspart  0-9 Units Subcutaneous TID WC   latanoprost  1 drop Both Eyes QHS   melatonin  3 mg Oral QHS   memantine  10 mg Oral BID   metoprolol succinate  25 mg Oral Daily   multivitamin with minerals  1 tablet Oral Daily   pantoprazole  40 mg Oral Daily   tamsulosin  0.4 mg Oral Daily   Continuous Infusions:   LOS: 2 days    Time spent: over 30 min    Fayrene Helper, MD [facility]   To contact the attending provider between 7A-7P or the covering provider during after hours 7P-7A, please log into the web site [email or web address] and access using universal [place] password for that web site. If you do not have the password, please call the hospital operator.  07/25/2022, 12:41 PM

## 2022-07-27 DIAGNOSIS — J90 Pleural effusion, not elsewhere classified: Secondary | ICD-10-CM | POA: Diagnosis not present

## 2022-07-27 LAB — PHOSPHORUS: Phosphorus: 3 mg/dL (ref 2.5–4.6)

## 2022-07-27 LAB — BASIC METABOLIC PANEL
Anion gap: 8 (ref 5–15)
BUN: 30 mg/dL — ABNORMAL HIGH (ref 8–23)
CO2: 23 mmol/L (ref 22–32)
Calcium: 7.9 mg/dL — ABNORMAL LOW (ref 8.9–10.3)
Chloride: 108 mmol/L (ref 98–111)
Creatinine, Ser: 1.99 mg/dL — ABNORMAL HIGH (ref 0.61–1.24)
GFR, Estimated: 30 mL/min — ABNORMAL LOW (ref >60)
Glucose, Bld: 117 mg/dL — ABNORMAL HIGH (ref 70–99)
Potassium: 4.8 mmol/L (ref 3.5–5.1)
Sodium: 139 mmol/L (ref 135–145)

## 2022-07-27 LAB — CBC
HCT: 27.5 % — ABNORMAL LOW (ref 39.0–52.0)
Hemoglobin: 9 g/dL — ABNORMAL LOW (ref 13.0–17.0)
MCH: 24.9 pg — ABNORMAL LOW (ref 26.0–34.0)
MCHC: 32.7 g/dL (ref 30.0–36.0)
MCV: 76.2 fL — ABNORMAL LOW (ref 80.0–100.0)
Platelets: 136 10*3/uL — ABNORMAL LOW (ref 150–400)
RBC: 3.61 MIL/uL — ABNORMAL LOW (ref 4.22–5.81)
RDW: 17.4 % — ABNORMAL HIGH (ref 11.5–15.5)
WBC: 7.7 10*3/uL (ref 4.0–10.5)
nRBC: 0 % (ref 0.0–0.2)

## 2022-07-27 LAB — GLUCOSE, CAPILLARY
Glucose-Capillary: 119 mg/dL — ABNORMAL HIGH (ref 70–99)
Glucose-Capillary: 179 mg/dL — ABNORMAL HIGH (ref 70–99)
Glucose-Capillary: 185 mg/dL — ABNORMAL HIGH (ref 70–99)
Glucose-Capillary: 139 mg/dL — ABNORMAL HIGH (ref 70–99)

## 2022-07-27 LAB — MAGNESIUM: Magnesium: 1.9 mg/dL (ref 1.7–2.4)

## 2022-07-27 NOTE — Progress Notes (Signed)
Spoke to [place] with [employer] who reports SNF auth request has been sent to their Medical Director for review. Will provide updates as available.   Wandra Feinstein, MSW, LCSW [phone] (coverage)

## 2022-07-27 NOTE — Progress Notes (Signed)
PROGRESS NOTE    [name]  [id number] DOB: Aug 02, 1923 DOA: 07/22/2022 PCP: Matthew Maw, MD  Chief Complaint  Patient presents with   Shortness of Breath    Brief Narrative:   89 year old man PMH including diastolic CHF, CKD, diabetes mellitus type 2, chronic hypoxic respiratory failure on 4 L presented to the emergency department with shortness of breath, reported hypotension by EMS.  Recently hospitalized and underwent left thoracentesis.  PMH includes bilateral pleural effusions.  Admitted for shortness of breath and treatment for pleural effusion.  Lives at home with granddaughter has 24-hour nursing.    Assessment & Plan:   Principal Problem:   Bilateral pleural effusion Active Problems:   Edema due to hypoalbuminemia   Type 2 diabetes mellitus without complication, without long-term current use of insulin ([name])   PPM-Medtronic   Chronic diastolic CHF (congestive heart failure) ([name])   Stage 3b chronic kidney disease ([name])   Essential hypertension   Alzheimer disease ([name])   DNR (do not resuscitate)/DNI(Do Not Intubate)   Chronic hypoxic respiratory failure, on home oxygen therapy ([name]) - 4 L/min  Heart Failure Exacerbation Bilateral pleural effusion Chronic hypoxic respiratory failure, on home oxygen therapy ([name]) - 4 L/min Hypoalbuminemia  - currently on 3 L by [name] Last thoracentesis 2/20 on the left.  Appears transudative by lights.  Cytology without malignant cells.  Sent home on oral Lasix 20 mg daily.   S/p L thoracentesis with 1.2 L of clear yellow fluid removed on 3/5 (no labs sent) Had repeat L thora 3/6 with additional 600 cc out (follow thora labs - pleural fluid LDH suggests exudative. Pending cytology.  Culture growing staph hominis, suspect this is [name] contaminant) S/p R thoracentesis today (transudate.  No malignant cells on cytology.  Follow pending culture) Suspect bilateral effusions related to diastolic HF - he does have low albumin as  well which likely contributing S/p lasix 20 mg x2 Planning 40 mg lasix PO daily at discharge   AKI on Stage 3b chronic kidney disease ([name]) Creatinine [name] on admission 2.48 with baseline perhaps around 1.8 - 2.0  S/p lasix.  Albumin May be difficult to manage edema and CKD - will ask for renal assistance - recommending lasix 40 mg PO daily, will transition to this at discharge  Pericardial Effusion - noted 2/18 echo, moderate pericardial effusion, larger than 12/2021 effusion - follow with diuresis  Alzheimer disease ([place]) Appears stable.  Continue Namenda.   Essential hypertension Stable.  Continue Toprol-XL 25 mg   Atrial Fibrillation - xarelto on hold, will transition to eliquis given his CKD   PPM-Medtronic   Type 2 diabetes mellitus without complication, without long-term current use of insulin ([name]) CBG stable.  Continue SSI.  will recommend stopping actos at discharge, since will plan for d/c to SNF, will likely d/c with SSI   DNR (do not resuscitate)/DNI(Do Not Intubate)     DVT prophylaxis: eliquis Code Status: DNR Family Communication: family friend at bedside Disposition:   Status is: Inpatient Remains inpatient appropriate because: pending thoracentesis and additional [place] conversations   Consultants:  IR  Procedures:  Left thoracentesis IMPRESSION: Successful ultrasound guided left thoracentesis yielding 1.2 L of pleural fluid.  Antimicrobials:  Anti-infectives (From admission, onward)    None       Subjective: Coffee this morning is hot enough No other concerns  Objective: Vitals:   07/27/22 0609 07/27/22 0812 07/27/22 0947 07/27/22 1000  BP: (!) 101/56 (!) 94/58 110/72 110/72  Pulse: 96 72 (!) 110 (!) 110  Resp: 18 16    Temp: 97.7 F (36.5 C) (!) 97.5 F (36.4 C)    TempSrc: Oral Oral    SpO2: 100% 100%    Weight:      Height:        Intake/Output Summary (Last 24 hours) at 07/27/2022 1543 Last data filed at 07/27/2022  1000 Gross per 24 hour  Intake 360 ml  Output --  Net 360 ml   Filed Weights   07/23/22 0635  Weight: 68.9 kg    Examination:  General: No acute distress. Cardiovascular: RRR Lungs: unlabored Neurological: Alert and oriented 3. Pleasant, hard of hearing.  Moves all extremities 4. Cranial nerves II through XII grossly intact. Extremities: unna boots  Data Reviewed: I have personally reviewed following labs and imaging studies  CBC: Recent Labs  Lab 07/22/22 1557 07/23/22 0945 07/25/22 0627 07/26/22 0549 07/27/22 0449  WBC 8.6 6.5 6.3 9.3 7.7  NEUTROABS 6.2 4.7 4.5  --   --   HGB 9.0* 9.8* 9.1* 9.5* 9.0*  HCT 27.4* 28.5* 27.6* 28.7* 27.5*  MCV 77.8* 74.4* 75.6* 76.1* 76.2*  PLT 182 192 164 168 136*    Basic Metabolic Panel: Recent Labs  Lab 07/22/22 1557 07/23/22 0945 07/25/22 0627 07/26/22 0549 07/27/22 0449  NA 137 138 138 139 139  K 4.6 4.6 4.5 4.5 4.8  CL 105 105 108 107 108  CO2 '22 24 23 26 23  '$ GLUCOSE 158* 132* 115* 148* 117*  BUN 32* 34* 31* 29* 30*  CREATININE 2.48* 2.54* 2.22* 2.11* 1.99*  CALCIUM 7.4* 8.0* 8.0* 7.9* 7.9*  MG  --  2.0 2.0 1.9 1.9  PHOS  --   --  3.3 3.1 3.0    GFR: Estimated Creatinine Clearance: 21.2 mL/min ([name]) (by C-G formula based on SCr of 1.99 mg/dL (H)).  Liver Function Tests: Recent Labs  Lab 07/22/22 1557 07/23/22 0945 07/25/22 0627  AST 17 14* 14*  ALT '8 8 9  '$ ALKPHOS 47 58 44  BILITOT 0.3 0.7 0.6  PROT 5.2* 5.8* 5.2*  ALBUMIN 2.4* 2.7* 2.7*    CBG: Recent Labs  Lab 07/26/22 1110 07/26/22 1606 07/26/22 2056 07/27/22 0807 07/27/22 1159  GLUCAP 120* 187* 103* 119* 179*     Recent Results (from the past 240 hour(s))  Resp panel by RT-PCR (RSV, Flu [name]&B, Covid) Anterior Nasal Swab     Status: None   Collection Time: 07/22/22  3:58 PM   Specimen: Anterior Nasal Swab  Result Value Ref Range Status   SARS Coronavirus 2 by RT PCR NEGATIVE NEGATIVE Final   Influenza [name] by PCR NEGATIVE NEGATIVE Final    Influenza B by PCR NEGATIVE NEGATIVE Final    Comment: (NOTE) The Xpert Xpress SARS-CoV-2/FLU/RSV plus assay is intended as an aid in the diagnosis of influenza from Nasopharyngeal swab specimens and should not be used as [name] sole basis for treatment. Nasal washings and aspirates are unacceptable for Xpert Xpress SARS-CoV-2/FLU/RSV testing.  Fact Sheet for Patients: EntrepreneurPulse.com.au  Fact Sheet for Healthcare Providers: IncredibleEmployment.be  This test is not yet approved or cleared by the [place] FDA and has been authorized for detection and/or diagnosis of SARS-CoV-2 by FDA under an Emergency Use Authorization (EUA). This EUA will remain in effect (meaning this test can be used) for the duration of the COVID-19 declaration under Section 564(b)(1) of the Act, 21 U.S.C. section 360bbb-3(b)(1), unless the authorization is terminated or revoked.  Resp Syncytial Virus by PCR NEGATIVE NEGATIVE Final    Comment: (NOTE) Fact Sheet for Patients: EntrepreneurPulse.com.au  Fact Sheet for Healthcare Providers: IncredibleEmployment.be  This test is not yet approved or cleared by the [place] FDA and has been authorized for detection and/or diagnosis of SARS-CoV-2 by FDA under an Emergency Use Authorization (EUA). This EUA will remain in effect (meaning this test can be used) for the duration of the COVID-19 declaration under Section 564(b)(1) of the Act, 21 U.S.C. section 360bbb-3(b)(1), unless the authorization is terminated or revoked.  Performed at [facility], [place] [address] [place], [place] [address]   Culture, body fluid w Gram Stain-bottle     Status: Abnormal (Preliminary result)   Collection Time: 07/24/22  4:34 PM   Specimen: Fluid  Result Value Ref Range Status   Specimen Description FLUID PLEURAL LEFT  Final   Special Requests BOTTLES DRAWN AEROBIC AND ANAEROBIC  Final    Gram Stain   Final    GRAM POSITIVE COCCI IN CLUSTERS ANAEROBIC BOTTLE ONLY CRITICAL RESULT CALLED TO, READ BACK BY AND VERIFIED WITH: RN Virgil Benedict ON 07/25/22 @ 1424 BY DRT Performed at [facility], [place] [address] [place], [place] [address]    Culture STAPHYLOCOCCUS HOMINIS (Stefany Starace)  Final   Report Status PENDING  Incomplete   Organism ID, Bacteria STAPHYLOCOCCUS HOMINIS  Final      Susceptibility   Staphylococcus hominis - MIC*    CIPROFLOXACIN <=0.5 SENSITIVE Sensitive     ERYTHROMYCIN >=8 RESISTANT Resistant     GENTAMICIN <=0.5 SENSITIVE Sensitive     OXACILLIN RESISTANT Resistant     TETRACYCLINE <=1 SENSITIVE Sensitive     VANCOMYCIN 1 SENSITIVE Sensitive     TRIMETH/SULFA 40 SENSITIVE Sensitive     CLINDAMYCIN RESISTANT Resistant     RIFAMPIN <=0.5 SENSITIVE Sensitive     Inducible Clindamycin POSITIVE Resistant     * STAPHYLOCOCCUS HOMINIS  Gram stain     Status: None   Collection Time: 07/24/22  4:34 PM   Specimen: Fluid  Result Value Ref Range Status   Specimen Description FLUID PLEURAL LEFT  Final   Special Requests NONE  Final   Gram Stain   Final    WBC PRESENT, PREDOMINANTLY PMN NO ORGANISMS SEEN CYTOSPIN SMEAR Performed at [facility], [place] [address], [place], [place] [address]    Report Status 07/24/2022 FINAL  Final  Body fluid culture w Gram Stain     Status: None (Preliminary result)   Collection Time: 07/25/22  1:11 PM   Specimen: Lung, Right; Pleural Fluid  Result Value Ref Range Status   Specimen Description FLUID PLEURAL RIGHT  Final   Special Requests NONE  Final   Gram Stain NO WBC SEEN NO ORGANISMS SEEN   Final   Culture   Final    NO GROWTH < 24 HOURS Performed at [facility], [place] [address], [place],  [address]    Report Status PENDING  Incomplete         Radiology Studies: No results found.      Scheduled Meds:  apixaban  2.5 mg Oral BID   feeding supplement (GLUCERNA SHAKE)  237 mL Oral TID  BM   finasteride  5 mg Oral Daily   furosemide  40 mg Oral Daily   insulin aspart  0-5 Units Subcutaneous QHS   insulin aspart  0-9 Units Subcutaneous TID WC   latanoprost  1 drop Both  Eyes QHS   melatonin  3 mg Oral QHS   memantine  10 mg Oral BID   metoprolol succinate  25 mg Oral Daily   multivitamin with minerals  1 tablet Oral Daily   pantoprazole  40 mg Oral Daily   tamsulosin  0.4 mg Oral Daily   Continuous Infusions:   LOS: 4 days    Time spent: over 30 min    Fayrene Helper, MD [facility]   To contact the attending provider between 7A-7P or the covering provider during after hours 7P-7A, please log into the web site [email or web address] and access using universal [place] password for that web site. If you do not have the password, please call the hospital operator.  07/27/2022, 3:43 PM

## 2022-07-27 NOTE — Plan of Care (Signed)

## 2022-07-28 DIAGNOSIS — J90 Pleural effusion, not elsewhere classified: Secondary | ICD-10-CM | POA: Diagnosis not present

## 2022-07-28 LAB — CBC
HCT: 26 % — ABNORMAL LOW (ref 39.0–52.0)
Hemoglobin: 8.7 g/dL — ABNORMAL LOW (ref 13.0–17.0)
MCH: 25.1 pg — ABNORMAL LOW (ref 26.0–34.0)
MCHC: 33.5 g/dL (ref 30.0–36.0)
MCV: 75.1 fL — ABNORMAL LOW (ref 80.0–100.0)
Platelets: 138 10*3/uL — ABNORMAL LOW (ref 150–400)
RBC: 3.46 MIL/uL — ABNORMAL LOW (ref 4.22–5.81)
RDW: 17.5 % — ABNORMAL HIGH (ref 11.5–15.5)
WBC: 7 10*3/uL (ref 4.0–10.5)
nRBC: 0 % (ref 0.0–0.2)

## 2022-07-28 LAB — BASIC METABOLIC PANEL
Anion gap: 10 (ref 5–15)
BUN: 32 mg/dL — ABNORMAL HIGH (ref 8–23)
CO2: 21 mmol/L — ABNORMAL LOW (ref 22–32)
Calcium: 8 mg/dL — ABNORMAL LOW (ref 8.9–10.3)
Chloride: 109 mmol/L (ref 98–111)
Creatinine, Ser: 2.08 mg/dL — ABNORMAL HIGH (ref 0.61–1.24)
GFR, Estimated: 29 mL/min — ABNORMAL LOW (ref >60)
Glucose, Bld: 120 mg/dL — ABNORMAL HIGH (ref 70–99)
Potassium: 4.9 mmol/L (ref 3.5–5.1)
Sodium: 140 mmol/L (ref 135–145)

## 2022-07-28 LAB — CULTURE, BODY FLUID W GRAM STAIN -BOTTLE

## 2022-07-28 LAB — GLUCOSE, CAPILLARY
Glucose-Capillary: 125 mg/dL — ABNORMAL HIGH (ref 70–99)
Glucose-Capillary: 150 mg/dL — ABNORMAL HIGH (ref 70–99)
Glucose-Capillary: 208 mg/dL — ABNORMAL HIGH (ref 70–99)
Glucose-Capillary: 164 mg/dL — ABNORMAL HIGH (ref 70–99)

## 2022-07-28 LAB — MAGNESIUM: Magnesium: 2 mg/dL (ref 1.7–2.4)

## 2022-07-28 LAB — PHOSPHORUS: Phosphorus: 3.6 mg/dL (ref 2.5–4.6)

## 2022-07-28 MED ORDER — MIDODRINE HCL 5 MG PO TABS
2.5000 mg | ORAL_TABLET | Freq: Three times a day (TID) | ORAL | Status: DC
  Administered 2022-07-28 – 2022-07-29 (×2): 2.5 mg via ORAL
  Filled 2022-07-28 (×2): qty 1

## 2022-07-28 MED ORDER — POLYETHYLENE GLYCOL 3350 17 G PO PACK
17.0000 g | PACK | Freq: Every day | ORAL | Status: DC
  Administered 2022-07-28 – 2022-07-29 (×2): 17 g via ORAL
  Filled 2022-07-28 (×2): qty 1

## 2022-07-28 MED ORDER — BOOST / RESOURCE BREEZE PO LIQD CUSTOM
1.0000 | Freq: Three times a day (TID) | ORAL | Status: DC
  Administered 2022-07-28 – 2022-07-29 (×2): 1 via ORAL

## 2022-07-28 NOTE — Plan of Care (Signed)

## 2022-07-28 NOTE — Progress Notes (Signed)
Mobility Specialist Progress Note   07/28/22 1630  Mobility  Activity Ambulated with assistance in room;Stood at bedside;Repositioned in chair (LE exercise)  Level of Assistance Contact guard assist, steadying assist  Assistive Device Front wheel walker  Distance Ambulated (ft) 15 ft  Range of Motion/Exercises Active;All extremities  Activity Response Tolerated well   Patient received in recliner and agreeable to participate. Stood with minimal HHA + cues for hand placement. Ambulated short distance in room then completed BLE exercises while seated in recliner chair. Tolerated without complaint or incident. Was left in recliner with all needs met, call bell in reach.   [place] Emera Bussie, BS EXP Mobility Specialist Please contact via SecureChat or Rehab office at [phone]

## 2022-07-28 NOTE — Progress Notes (Addendum)
Voicemail left for [employer] CM requesting return call with auth update. Auth request went to Medical Director review yesterday but no updates so far today. Confirmed [facility] has a bed available today pending auth.    UPDATE 1330: call received from [facility] with [employer] who reports they would like to offer a peer-to-peer review for SNF and ambulance transport. MD notified and provided with P2P contact information.    UPDATE 1620: spoke to [place] with [employer] who reports pt has been approved for SNF/STR at [facility] for 7 days, auth # [id number]. Updated Kia in [facility] admissions. Pt has been denied for ambulance transport. Will need to f/u with family tomorrow to discuss possible [facility] or other transport options they may have in place.   Wandra Feinstein, MSW, LCSW [phone] (coverage)

## 2022-07-28 NOTE — Progress Notes (Signed)
PROGRESS NOTE    [name]  [id number] DOB: 14-Jul-1923 DOA: 07/22/2022 PCP: Matthew Maw, MD  Chief Complaint  Patient presents with   Shortness of Breath    Brief Narrative:   89 year old man PMH including diastolic CHF, CKD, diabetes mellitus type 2, chronic hypoxic respiratory failure on 4 L presented to the emergency department with shortness of breath, reported hypotension by EMS.  Recently hospitalized and underwent left thoracentesis.  PMH includes bilateral pleural effusions.  Admitted for shortness of breath and treatment for pleural effusion.  Lives at home with granddaughter has 24-hour nursing.    Assessment & Plan:   Principal Problem:   Bilateral pleural effusion Active Problems:   Edema due to hypoalbuminemia   Type 2 diabetes mellitus without complication, without long-term current use of insulin ([name])   PPM-Medtronic   Chronic diastolic CHF (congestive heart failure) ([name])   Stage 3b chronic kidney disease ([name])   Essential hypertension   Alzheimer disease ([name])   DNR (do not resuscitate)/DNI(Do Not Intubate)   Chronic hypoxic respiratory failure, on home oxygen therapy ([name]) - 4 L/min  Heart Failure Exacerbation Bilateral pleural effusion Chronic hypoxic respiratory failure, on home oxygen therapy ([name]) - 4 L/min Hypoalbuminemia  - currently on 3 L by [place] Last thoracentesis 2/20 on the left.  Appears transudative by lights.  Cytology without malignant cells.  Sent home on oral Lasix 20 mg daily.   S/p L thoracentesis with 1.2 L of clear yellow fluid removed on 3/5 (no labs sent) Had repeat L thora 3/6 with additional 600 cc out (follow thora labs - pleural fluid LDH suggests exudative. Pending cytology.  Culture growing staph hominis, suspect this is [name] contaminant) S/p R thoracentesis today (transudate.  No malignant cells on cytology.  Follow pending culture) Suspect bilateral effusions related to diastolic HF - he does have low albumin as  well which likely contributing S/p lasix 20 mg x2 Planning 40 mg lasix PO daily at discharge   AKI on Stage 3b chronic kidney disease ([name]) Creatinine [name] bit worse on admission 2.48 with baseline perhaps around 1.8 - 2.0  S/p lasix.  Albumin May be difficult to manage edema and CKD - will ask for renal assistance - recommending lasix 40 mg PO daily, will transition to this at discharge  Pericardial Effusion - noted 2/18 echo, moderate pericardial effusion, larger than 12/2021 effusion - follow with diuresis  Alzheimer disease ([name]) Appears stable.  Continue Namenda.   Essential hypertension -> Now Hypotension Hold metoprolol  Start midodrine, low dose   Atrial Fibrillation - xarelto on hold, will transition to eliquis given his CKD   PPM-Medtronic   Type 2 diabetes mellitus without complication, without long-term current use of insulin ([name]) CBG stable.  Continue SSI.  will recommend stopping actos at discharge, since will plan for d/c to SNF, will likely d/c with SSI   DNR (do not resuscitate)/DNI(Do Not Intubate)     DVT prophylaxis: eliquis Code Status: DNR Family Communication: family friend at bedside Disposition:   Status is: Inpatient Remains inpatient appropriate because: pending thoracentesis and additional [place] conversations   Consultants:  IR  Procedures:  Left thoracentesis IMPRESSION: Successful ultrasound guided left thoracentesis yielding 1.2 L of pleural fluid.  Antimicrobials:  Anti-infectives (From admission, onward)    None       Subjective: No complaints His coffee was great this morning  Objective: Vitals:   07/27/22 2024 07/28/22 0446 07/28/22 0815 07/28/22 1117  BP: 91/67 (!) 86/56  92/61 (!) 87/55  Pulse: 81 67 85 86  Resp: '18 17 16 16  '$ Temp: 98.4 F (36.9 C) 97.9 F (36.6 C) 98.3 F (36.8 C) 98 F (36.7 C)  TempSrc: Oral Oral Oral Oral  SpO2: 98% 100% 100% 100%  Weight:      Height:        Intake/Output Summary (Last  24 hours) at 07/28/2022 1552 Last data filed at 07/28/2022 0900 Gross per 24 hour  Intake 120 ml  Output 300 ml  Net -180 ml   Filed Weights   07/23/22 0635  Weight: 68.9 kg    Examination:  General: No acute distress.  Sitting up, appears comfortable Cardiovascular: RRR Lungs: unlabored on 2 L  Neurological: Alert and oriented 3. Moves all extremities 4 with equal strength. Cranial nerves II through XII grossly intact. Extremities: No clubbing or cyanosis. No edema.  Data Reviewed: I have personally reviewed following labs and imaging studies  CBC: Recent Labs  Lab 07/22/22 1557 07/23/22 0945 07/25/22 0627 07/26/22 0549 07/27/22 0449 07/28/22 0429  WBC 8.6 6.5 6.3 9.3 7.7 7.0  NEUTROABS 6.2 4.7 4.5  --   --   --   HGB 9.0* 9.8* 9.1* 9.5* 9.0* 8.7*  HCT 27.4* 28.5* 27.6* 28.7* 27.5* 26.0*  MCV 77.8* 74.4* 75.6* 76.1* 76.2* 75.1*  PLT 182 192 164 168 136* 138*    Basic Metabolic Panel: Recent Labs  Lab 07/23/22 0945 07/25/22 0627 07/26/22 0549 07/27/22 0449 07/28/22 0429  NA 138 138 139 139 140  K 4.6 4.5 4.5 4.8 4.9  CL 105 108 107 108 109  CO2 '24 23 26 23 '$ 21*  GLUCOSE 132* 115* 148* 117* 120*  BUN 34* 31* 29* 30* 32*  CREATININE 2.54* 2.22* 2.11* 1.99* 2.08*  CALCIUM 8.0* 8.0* 7.9* 7.9* 8.0*  MG 2.0 2.0 1.9 1.9 2.0  PHOS  --  3.3 3.1 3.0 3.6    GFR: Estimated Creatinine Clearance: 20.2 mL/min ([name]) (by C-G formula based on SCr of 2.08 mg/dL (H)).  Liver Function Tests: Recent Labs  Lab 07/22/22 1557 07/23/22 0945 07/25/22 0627  AST 17 14* 14*  ALT '8 8 9  '$ ALKPHOS 47 58 44  BILITOT 0.3 0.7 0.6  PROT 5.2* 5.8* 5.2*  ALBUMIN 2.4* 2.7* 2.7*    CBG: Recent Labs  Lab 07/27/22 1159 07/27/22 1609 07/27/22 2020 07/28/22 0848 07/28/22 1146  GLUCAP 179* 185* 139* 125* 150*     Recent Results (from the past 240 hour(s))  Resp panel by RT-PCR (RSV, Flu [name]&B, Covid) Anterior Nasal Swab     Status: None   Collection Time: 07/22/22  3:58 PM    Specimen: Anterior Nasal Swab  Result Value Ref Range Status   SARS Coronavirus 2 by RT PCR NEGATIVE NEGATIVE Final   Influenza [name] by PCR NEGATIVE NEGATIVE Final   Influenza B by PCR NEGATIVE NEGATIVE Final    Comment: (NOTE) The Xpert Xpress SARS-CoV-2/FLU/RSV plus assay is intended as an aid in the diagnosis of influenza from Nasopharyngeal swab specimens and should not be used as Tawana Pasch sole basis for treatment. Nasal washings and aspirates are unacceptable for Xpert Xpress SARS-CoV-2/FLU/RSV testing.  Fact Sheet for Patients: EntrepreneurPulse.com.au  Fact Sheet for Healthcare Providers: IncredibleEmployment.be  This test is not yet approved or cleared by the [place] FDA and has been authorized for detection and/or diagnosis of SARS-CoV-2 by FDA under an Emergency Use Authorization (EUA). This EUA will remain in effect (meaning this test can be used) for  the duration of the COVID-19 declaration under Section 564(b)(1) of the Act, 21 U.S.C. section 360bbb-3(b)(1), unless the authorization is terminated or revoked.     Resp Syncytial Virus by PCR NEGATIVE NEGATIVE Final    Comment: (NOTE) Fact Sheet for Patients: EntrepreneurPulse.com.au  Fact Sheet for Healthcare Providers: IncredibleEmployment.be  This test is not yet approved or cleared by the [place] FDA and has been authorized for detection and/or diagnosis of SARS-CoV-2 by FDA under an Emergency Use Authorization (EUA). This EUA will remain in effect (meaning this test can be used) for the duration of the COVID-19 declaration under Section 564(b)(1) of the Act, 21 U.S.C. section 360bbb-3(b)(1), unless the authorization is terminated or revoked.  Performed at [facility], [place] [address], [place], [place] [address]   Culture, body fluid w Gram Stain-bottle     Status: Abnormal   Collection Time: 07/24/22  4:34 PM   Specimen: Fluid   Result Value Ref Range Status   Specimen Description FLUID PLEURAL LEFT  Final   Special Requests BOTTLES DRAWN AEROBIC AND ANAEROBIC  Final   Gram Stain   Final    GRAM POSITIVE COCCI IN CLUSTERS ANAEROBIC BOTTLE ONLY CRITICAL RESULT CALLED TO, READ BACK BY AND VERIFIED WITH: RN Virgil Benedict ON 07/25/22 @ 1424 BY DRT Performed at [facility], [place] [address], [place], [place] [address]    Culture STAPHYLOCOCCUS HOMINIS (Lorrane Mccay)  Final   Report Status 07/28/2022 FINAL  Final   Organism ID, Bacteria STAPHYLOCOCCUS HOMINIS  Final      Susceptibility   Staphylococcus hominis - MIC*    CIPROFLOXACIN <=0.5 SENSITIVE Sensitive     ERYTHROMYCIN >=8 RESISTANT Resistant     GENTAMICIN <=0.5 SENSITIVE Sensitive     OXACILLIN RESISTANT Resistant     TETRACYCLINE <=1 SENSITIVE Sensitive     VANCOMYCIN 1 SENSITIVE Sensitive     TRIMETH/SULFA 40 SENSITIVE Sensitive     CLINDAMYCIN RESISTANT Resistant     RIFAMPIN <=0.5 SENSITIVE Sensitive     Inducible Clindamycin POSITIVE Resistant     * STAPHYLOCOCCUS HOMINIS  Gram stain     Status: None   Collection Time: 07/24/22  4:34 PM   Specimen: Fluid  Result Value Ref Range Status   Specimen Description FLUID PLEURAL LEFT  Final   Special Requests NONE  Final   Gram Stain   Final    WBC PRESENT, PREDOMINANTLY PMN NO ORGANISMS SEEN CYTOSPIN SMEAR Performed at [facility], [place] [address], [place], [place] [address]    Report Status 07/24/2022 FINAL  Final  Body fluid culture w Gram Stain     Status: None (Preliminary result)   Collection Time: 07/25/22  1:11 PM   Specimen: Lung, Right; Pleural Fluid  Result Value Ref Range Status   Specimen Description FLUID PLEURAL RIGHT  Final   Special Requests NONE  Final   Gram Stain NO WBC SEEN NO ORGANISMS SEEN   Final   Culture   Final    NO GROWTH 2 DAYS Performed at [facility], [place] [address], [place],  [address]    Report Status PENDING  Incomplete          Radiology Studies: No results found.      Scheduled Meds:  apixaban  2.5 mg Oral BID   feeding supplement (GLUCERNA SHAKE)  237 mL Oral TID BM   finasteride  5 mg Oral Daily   furosemide  40 mg Oral Daily  insulin aspart  0-5 Units Subcutaneous QHS   insulin aspart  0-9 Units Subcutaneous TID WC   latanoprost  1 drop Both Eyes QHS   melatonin  3 mg Oral QHS   memantine  10 mg Oral BID   metoprolol succinate  25 mg Oral Daily   multivitamin with minerals  1 tablet Oral Daily   pantoprazole  40 mg Oral Daily   tamsulosin  0.4 mg Oral Daily   Continuous Infusions:   LOS: 5 days    Time spent: over 30 min    Fayrene Helper, MD [facility]   To contact the attending provider between 7A-7P or the covering provider during after hours 7P-7A, please log into the web site [email or web address] and access using universal [place] password for that web site. If you do not have the password, please call the hospital operator.  07/28/2022, 3:52 PM

## 2022-07-29 DIAGNOSIS — G629 Polyneuropathy, unspecified: Secondary | ICD-10-CM | POA: Diagnosis not present

## 2022-07-29 DIAGNOSIS — K5901 Slow transit constipation: Secondary | ICD-10-CM | POA: Diagnosis not present

## 2022-07-29 DIAGNOSIS — G309 Alzheimer's disease, unspecified: Secondary | ICD-10-CM | POA: Diagnosis not present

## 2022-07-29 DIAGNOSIS — J439 Emphysema, unspecified: Secondary | ICD-10-CM | POA: Diagnosis not present

## 2022-07-29 DIAGNOSIS — I739 Peripheral vascular disease, unspecified: Secondary | ICD-10-CM | POA: Diagnosis not present

## 2022-07-29 DIAGNOSIS — E559 Vitamin D deficiency, unspecified: Secondary | ICD-10-CM | POA: Diagnosis not present

## 2022-07-29 DIAGNOSIS — M6281 Muscle weakness (generalized): Secondary | ICD-10-CM | POA: Diagnosis not present

## 2022-07-29 DIAGNOSIS — I959 Hypotension, unspecified: Secondary | ICD-10-CM | POA: Diagnosis not present

## 2022-07-29 DIAGNOSIS — J918 Pleural effusion in other conditions classified elsewhere: Secondary | ICD-10-CM | POA: Diagnosis not present

## 2022-07-29 DIAGNOSIS — K219 Gastro-esophageal reflux disease without esophagitis: Secondary | ICD-10-CM | POA: Diagnosis not present

## 2022-07-29 DIAGNOSIS — H409 Unspecified glaucoma: Secondary | ICD-10-CM | POA: Diagnosis not present

## 2022-07-29 DIAGNOSIS — N1832 Chronic kidney disease, stage 3b: Secondary | ICD-10-CM | POA: Diagnosis not present

## 2022-07-29 DIAGNOSIS — G4700 Insomnia, unspecified: Secondary | ICD-10-CM | POA: Diagnosis not present

## 2022-07-29 DIAGNOSIS — E119 Type 2 diabetes mellitus without complications: Secondary | ICD-10-CM | POA: Diagnosis not present

## 2022-07-29 DIAGNOSIS — M150 Primary generalized (osteo)arthritis: Secondary | ICD-10-CM | POA: Diagnosis not present

## 2022-07-29 DIAGNOSIS — J90 Pleural effusion, not elsewhere classified: Secondary | ICD-10-CM | POA: Diagnosis not present

## 2022-07-29 DIAGNOSIS — E43 Unspecified severe protein-calorie malnutrition: Secondary | ICD-10-CM | POA: Diagnosis not present

## 2022-07-29 DIAGNOSIS — Z23 Encounter for immunization: Secondary | ICD-10-CM | POA: Diagnosis not present

## 2022-07-29 DIAGNOSIS — J9611 Chronic respiratory failure with hypoxia: Secondary | ICD-10-CM | POA: Diagnosis not present

## 2022-07-29 DIAGNOSIS — I3139 Other pericardial effusion (noninflammatory): Secondary | ICD-10-CM | POA: Diagnosis not present

## 2022-07-29 DIAGNOSIS — I5032 Chronic diastolic (congestive) heart failure: Secondary | ICD-10-CM | POA: Diagnosis not present

## 2022-07-29 DIAGNOSIS — N138 Other obstructive and reflux uropathy: Secondary | ICD-10-CM | POA: Diagnosis not present

## 2022-07-29 DIAGNOSIS — I482 Chronic atrial fibrillation, unspecified: Secondary | ICD-10-CM | POA: Diagnosis not present

## 2022-07-29 DIAGNOSIS — F0280 Dementia in other diseases classified elsewhere without behavioral disturbance: Secondary | ICD-10-CM | POA: Diagnosis not present

## 2022-07-29 DIAGNOSIS — E875 Hyperkalemia: Secondary | ICD-10-CM | POA: Diagnosis not present

## 2022-07-29 DIAGNOSIS — N401 Enlarged prostate with lower urinary tract symptoms: Secondary | ICD-10-CM | POA: Diagnosis not present

## 2022-07-29 LAB — BASIC METABOLIC PANEL
Anion gap: 10 (ref 5–15)
BUN: 37 mg/dL — ABNORMAL HIGH (ref 8–23)
CO2: 21 mmol/L — ABNORMAL LOW (ref 22–32)
Calcium: 7.8 mg/dL — ABNORMAL LOW (ref 8.9–10.3)
Chloride: 109 mmol/L (ref 98–111)
Creatinine, Ser: 2.12 mg/dL — ABNORMAL HIGH (ref 0.61–1.24)
GFR, Estimated: 28 mL/min — ABNORMAL LOW (ref >60)
Glucose, Bld: 171 mg/dL — ABNORMAL HIGH (ref 70–99)
Potassium: 5.3 mmol/L — ABNORMAL HIGH (ref 3.5–5.1)
Sodium: 140 mmol/L (ref 135–145)

## 2022-07-29 LAB — PHOSPHORUS: Phosphorus: 3.7 mg/dL (ref 2.5–4.6)

## 2022-07-29 LAB — CBC
HCT: 25.9 % — ABNORMAL LOW (ref 39.0–52.0)
Hemoglobin: 9 g/dL — ABNORMAL LOW (ref 13.0–17.0)
MCH: 25.9 pg — ABNORMAL LOW (ref 26.0–34.0)
MCHC: 34.7 g/dL (ref 30.0–36.0)
MCV: 74.6 fL — ABNORMAL LOW (ref 80.0–100.0)
Platelets: 145 10*3/uL — ABNORMAL LOW (ref 150–400)
RBC: 3.47 MIL/uL — ABNORMAL LOW (ref 4.22–5.81)
RDW: 17.5 % — ABNORMAL HIGH (ref 11.5–15.5)
WBC: 7.4 10*3/uL (ref 4.0–10.5)
nRBC: 0 % (ref 0.0–0.2)

## 2022-07-29 LAB — MAGNESIUM: Magnesium: 2 mg/dL (ref 1.7–2.4)

## 2022-07-29 LAB — GLUCOSE, CAPILLARY
Glucose-Capillary: 138 mg/dL — ABNORMAL HIGH (ref 70–99)
Glucose-Capillary: 210 mg/dL — ABNORMAL HIGH (ref 70–99)

## 2022-07-29 MED ORDER — APIXABAN 2.5 MG PO TABS: 2.5000 mg | ORAL_TABLET | Freq: Two times a day (BID) | ORAL | 1 refills | Status: DC

## 2022-07-29 MED ORDER — SODIUM ZIRCONIUM CYCLOSILICATE 5 G PO PACK
5.0000 g | PACK | Freq: Once | ORAL | Status: AC
  Administered 2022-07-29: 5 g via ORAL
  Filled 2022-07-29: qty 1

## 2022-07-29 MED ORDER — ADULT MULTIVITAMIN W/MINERALS CH: 1.0000 | ORAL_TABLET | Freq: Every day | ORAL | 0 refills | Status: DC

## 2022-07-29 MED ORDER — MIDODRINE HCL 2.5 MG PO TABS: 2.5000 mg | ORAL_TABLET | Freq: Three times a day (TID) | ORAL | 1 refills | Status: DC

## 2022-07-29 MED ORDER — FUROSEMIDE 40 MG PO TABS: 40.0000 mg | ORAL_TABLET | Freq: Every day | ORAL | 3 refills | Status: DC

## 2022-07-29 NOTE — Discharge Summary (Signed)
Physician Discharge Summary  CLARNCE YEP [id number] DOB: 12/28/1923 DOA: 07/22/2022  PCP: [name], MD  Admit date: 07/22/2022 Discharge date: 07/29/2022  Admitted From: Home Disposition: SNF  Recommendations for Outpatient Follow-up:  Follow up with PCP in 1-2 weeks Follow-up with cardiology as scheduled  Discharge Condition: Stable CODE STATUS: DNR Diet recommendation: Low-salt low-fat diabetic diet  Brief/Interim Summary: 89 year old man PMH including diastolic CHF, CKD, diabetes mellitus type 2, chronic hypoxic respiratory failure on 4 L presented to the emergency department with shortness of breath, reported hypotension by EMS.  Recently hospitalized and underwent left thoracentesis.  PMH includes bilateral pleural effusions.  Admitted for shortness of breath and treatment for pleural effusion.  Lives at home with granddaughter has 24-hour nursing.   Patient admitted as above with acute heart failure exacerbation, pleural effusions and acute on chronic respiratory failure.  Patient diuresed quite well, thoracentesis performed consistent with transudative fluid, cytology without malignancy.  Initial cultures showing Staph hominis which was suspected to be contaminant.  AKI on CKD 3B improved, now back to baseline.  Patient Xarelto was discontinued replaced with low-dose Eliquis given advanced kidney disease and age.  Continue Lasix at discharge per discussion with nephrology.  Patient's other chronic comorbid conditions including Alzheimer's, atrial fibrillation and diabetes appear to be stable.  Of note patient did have some moderate hypotension, as such his metoprolol was discontinued and patient was initiated on midodrine with appropriate response.  Discharge Diagnoses:  Principal Problem:   Bilateral pleural effusion Active Problems:   Edema due to hypoalbuminemia   Type 2 diabetes mellitus without complication, without long-term current use of insulin ([name])    PPM-Medtronic   Chronic diastolic CHF (congestive heart failure) ([name])   Stage 3b chronic kidney disease ([name])   Essential hypertension   Alzheimer disease ([place])   DNR (do not resuscitate)/DNI(Do Not Intubate)   Chronic hypoxic respiratory failure, on home oxygen therapy ([name]) - 4 L/min  Heart Failure Exacerbation Bilateral pleural effusion Chronic hypoxic respiratory failure, on home oxygen therapy ([name]) - 4 L/min Hypoalbuminemia  Thoracentesis 2/20 on the left.  Appears transudative by lights.  Cytology without malignant cells.  Sent home on oral Lasix 20 mg daily.   S/p L thoracentesis here with 1.2 L of clear yellow fluid removed on 3/5 (no labs sent) Had repeat L thora 3/6 with additional 600 cc out (follow thora labs - pleural fluid LDH suggests exudative. Pending cytology.  Culture growing staph hominis, suspect this is a contaminant) S/p R thoracentesis today (transudate.  No malignant cells on cytology.  Follow pending culture) Suspect bilateral effusions related to diastolic HF - he does have low albumin as well which likely contributing Planning 40 mg lasix PO daily at discharge   AKI on Stage 3b chronic kidney disease ([place]), stabilized Creatinine approaching baseline Nephrology sidelined, recommending lasix 40 mg PO daily   Pericardial Effusion - noted 2/18 echo, moderate pericardial effusion, larger than 12/2021 effusion - follow with diuresis, remains asymptomatic   Alzheimer disease ([place]) Appears stable.  Continue Namenda.   Essential hypertension -> Now Hypotension Hold metoprolol  Start midodrine, low dose   Atrial Fibrillation - xarelto on hold, will transition to eliquis given his CKD    PPM-Medtronic   Type 2 diabetes mellitus without complication, without long-term current use of insulin ([name]) CBG stable.  Discontinue Actos, patient using minimum sliding scale upwards of 3 to 4 units of sliding scale insulin over the past few days.  Will discontinue at  discharge, recommend diabetic diet  Discharge Instructions   Allergies as of 07/29/2022   No Known Allergies      Medication List     STOP taking these medications    metoprolol succinate 25 MG 24 hr tablet Commonly known as: TOPROL-XL   pioglitazone 15 MG tablet Commonly known as: Actos   Rivaroxaban 15 MG Tabs tablet Commonly known as: Xarelto   traZODone 50 MG tablet Commonly known as: DESYREL       TAKE these medications    apixaban 2.5 MG Tabs tablet Commonly known as: ELIQUIS Take 1 tablet (2.5 mg total) by mouth 2 (two) times daily.   divalproex 125 MG capsule Commonly known as: DEPAKOTE SPRINKLE Take 1 capsule (125 mg total) by mouth 2 (two) times daily.   docusate sodium 100 MG capsule Commonly known as: COLACE Take 1 capsule (100 mg total) by mouth daily.   finasteride 5 MG tablet Commonly known as: PROSCAR TAKE 1 TABLET (5 MG TOTAL) BY MOUTH DAILY.   furosemide 40 MG tablet Commonly known as: LASIX Take 1 tablet (40 mg total) by mouth daily. Start taking on: July 30, 2022 What changed:  medication strength how much to take   Iron (Ferrous Sulfate) 325 (65 Fe) MG Tabs Take 1 tablet every other day. What changed:  how much to take how to take this when to take this additional instructions   latanoprost 0.005 % ophthalmic solution Commonly known as: XALATAN Place 1 drop into both eyes at bedtime.   loratadine 10 MG tablet Commonly known as: CLARITIN Take 10 mg by mouth daily as needed for rhinitis.   Melatonin 3 MG Tbdp Take 1 tablet by mouth at bedtime.   memantine 10 MG tablet Commonly known as: NAMENDA Take 10 mg by mouth 2 (two) times daily.   midodrine 2.5 MG tablet Commonly known as: PROAMATINE Take 1 tablet (2.5 mg total) by mouth 3 (three) times daily with meals.   multivitamin with minerals Tabs tablet Take 1 tablet by mouth daily. Start taking on: July 30, 2022   pantoprazole 40 MG tablet Commonly known as:  PROTONIX TAKE 1 TABLET(40 MG) BY MOUTH DAILY What changed:  how much to take how to take this when to take this additional instructions   polyethylene glycol powder 17 GM/SCOOP powder Commonly known as: GLYCOLAX/MIRALAX Take 17 g by mouth 2 (two) times daily as needed.   tamsulosin 0.4 MG Caps capsule Commonly known as: FLOMAX Take 1 capsule (0.4 mg total) by mouth daily.        No Known Allergies  Consultations: Nephrology   Procedures/Studies: IR THORACENTESIS ASP PLEURAL SPACE W/IMG GUIDE  Result Date: 07/25/2022 INDICATION: Diastolic heart failure with recurrent right pleural effusion. Request for diagnostic and therapeutic thoracentesis. EXAM: ULTRASOUND GUIDED RIGHT THORACENTESIS MEDICATIONS: 1% lidocaine 10 mL COMPLICATIONS: None immediate. PROCEDURE: An ultrasound guided thoracentesis was thoroughly discussed with the patient and questions answered. The benefits, risks, alternatives and complications were also discussed. The patient understands and wishes to proceed with the procedure. Written consent was obtained. Ultrasound was performed to localize and mark an adequate pocket of fluid in the right chest. The area was then prepped and draped in the normal sterile fashion. 1% Lidocaine was used for local anesthesia. Under ultrasound guidance a 6 Fr Safe-T-Centesis catheter was introduced. Thoracentesis was performed. The catheter was removed and a dressing applied. FINDINGS: A total of approximately 850 mL of clear yellow fluid was removed. Samples were sent to the laboratory as  requested by the clinical team. IMPRESSION: Successful ultrasound guided right thoracentesis yielding 850 mL of pleural fluid. No pneumothorax on post-procedure chest x-ray. Procedure performed by: Gareth Eagle, PA-C Electronically Signed   By: Ruthann Cancer M.D.   On: 07/25/2022 13:47   DG Chest 1 View  Result Date: 07/25/2022 CLINICAL DATA:  Status post right thoracentesis EXAM: CHEST  1 VIEW  COMPARISON:  Previous studies including the examination of 07/24/2022 FINDINGS: Transverse diameter of heart is increased. There is significant interval decrease in right pleural effusion after thoracentesis. Right lateral CP angle is clear. There is no pneumothorax. There are linear densities in both lower lung fields. There is blunting of left lateral CP angle. Pacemaker battery is seen in left infraclavicular region. IMPRESSION: There is significant interval decrease in right pleural effusion. There is no pneumothorax. Small patchy infiltrates in both lower lung fields may suggest atelectasis. Small left pleural effusion. Electronically Signed   By: Elmer Picker M.D.   On: 07/25/2022 13:26   IR THORACENTESIS ASP PLEURAL SPACE W/IMG GUIDE  Result Date: 07/24/2022 INDICATION: Patient with history of recurrent left pleural effusion. Request for repeat left thoracentesis. EXAM: ULTRASOUND GUIDED LEFT THORACENTESIS MEDICATIONS: 10 mL 1% lidocaine COMPLICATIONS: None immediate. PROCEDURE: An ultrasound guided thoracentesis was thoroughly discussed with the patient and questions answered. The benefits, risks, alternatives and complications were also discussed. The patient understands and wishes to proceed with the procedure. Written consent was obtained. Ultrasound was performed to localize and mark an adequate pocket of fluid in the left chest. The area was then prepped and draped in the normal sterile fashion. 1% Lidocaine was used for local anesthesia. Under ultrasound guidance a 6 Fr Safe-T-Centesis catheter was introduced. Thoracentesis was performed. The catheter was removed and a dressing applied. FINDINGS: A total of approximately 600 mL of yellow fluid was removed. Samples were sent to the laboratory as requested by the clinical team. IMPRESSION: Successful ultrasound guided left thoracentesis yielding 600 mL of pleural fluid. Read by: Brynda Greathouse PA-C Electronically Signed   By: Ruthann Cancer M.D.    On: 07/24/2022 16:26   DG Chest Port 1 View  Result Date: 07/24/2022 CLINICAL DATA:  Pleural effusion. EXAM: PORTABLE CHEST 1 VIEW COMPARISON:  July 23, 2022. FINDINGS: Enlarged cardiac silhouette. Left subclavian approach cardiac rhythm maintenance device. Layering bilateral pleural effusions. No visible pneumothorax. Polyarticular degenerative change. IMPRESSION: Layering bilateral pleural effusions without visible pneumothorax. Electronically Signed   By: Margaretha Sheffield M.D.   On: 07/24/2022 16:20   IR THORACENTESIS ASP PLEURAL SPACE W/IMG GUIDE  Result Date: 07/23/2022 INDICATION: Shortness of breath. Left-sided pleural effusion. Request for therapeutic thoracentesis. EXAM: ULTRASOUND GUIDED LEFT THORACENTESIS MEDICATIONS: 1% plain lidocaine, 5 mL COMPLICATIONS: None immediate. PROCEDURE: An ultrasound guided thoracentesis was thoroughly discussed with the patient and questions answered. The benefits, risks, alternatives and complications were also discussed. The patient understands and wishes to proceed with the procedure. Written consent was obtained. Ultrasound was performed to localize and mark an adequate pocket of fluid in the left chest. The area was then prepped and draped in the normal sterile fashion. 1% Lidocaine was used for local anesthesia. Under ultrasound guidance a 6 Fr Safe-T-Centesis catheter was introduced. Thoracentesis was performed. The catheter was removed and a dressing applied. FINDINGS: A total of approximately 1.2 L of clear yellow fluid was removed. IMPRESSION: Successful ultrasound guided left thoracentesis yielding 1.2 L of pleural fluid. Read by: Ascencion Dike PA-C Electronically Signed   By: Glade Nurse.D.  On: 07/23/2022 12:44   DG Chest 1 View  Result Date: 07/23/2022 CLINICAL DATA:  Left-sided thoracentesis. EXAM: CHEST  1 VIEW COMPARISON:  Chest x-ray July 22, 2022. FINDINGS: Decreased layering left pleural effusion and similar right pleural effusion. No  visible pneumothorax on this semi erect radiograph. Similar overlying bibasilar opacities. Similar enlarged cardiac silhouette. Similar position of left subclavian approach dual lead cardiac rhythm maintenance device. No acute osseous abnormality. IMPRESSION: 1. Decreased layering left pleural effusion and similar right pleural effusion. No visible pneumothorax on this semi erect radiograph. 2. Similar overlying bibasilar opacities, potentially atelectasis. Electronically Signed   By: Margaretha Sheffield M.D.   On: 07/23/2022 12:22   DG Chest 2 View  Result Date: 07/22/2022 CLINICAL DATA:  Dyspnea. EXAM: CHEST - 2 VIEW COMPARISON:  July 09, 2022. FINDINGS: Stable cardiomegaly. Left-sided pacemaker is unchanged in position. Bilateral pleural effusions are noted, left greater than right, with associated bibasilar atelectasis or edema. Bony thorax is unremarkable. IMPRESSION: Bilateral pleural effusions are noted, left greater than right, with associated bibasilar atelectasis or edema. Electronically Signed   By: Marijo Conception M.D.   On: 07/22/2022 16:08   US THORACENTESIS ASP PLEURAL SPACE W/IMG GUIDE  Result Date: 07/09/2022 INDICATION: Shortness of breath, left-sided pleural effusion. Request for diagnostic and therapeutic thoracentesis. EXAM: ULTRASOUND GUIDED LEFT THORACENTESIS MEDICATIONS: 1% plain lidocaine, 5 mL COMPLICATIONS: None immediate. PROCEDURE: An ultrasound guided thoracentesis was thoroughly discussed with the patient and questions answered. The benefits, risks, alternatives and complications were also discussed. The patient understands and wishes to proceed with the procedure. Written consent was obtained. Ultrasound was performed to localize and mark an adequate pocket of fluid in the left chest. The area was then prepped and draped in the normal sterile fashion. 1% Lidocaine was used for local anesthesia. Under ultrasound guidance a 6 Fr Safe-T-Centesis catheter was introduced.  Thoracentesis was performed. The catheter was removed and a dressing applied. FINDINGS: A total of approximately 1.3 L of clear yellow fluid was removed. Samples were sent to the laboratory as requested by the clinical team. IMPRESSION: Successful ultrasound guided LEFT thoracentesis yielding 1.3 L of pleural fluid. Read by: Ascencion Dike PA-C Electronically Signed   By: Michaelle Birks M.D.   On: 07/09/2022 14:22   DG CHEST PORT 1 VIEW  Result Date: 07/09/2022 CLINICAL DATA:  Status post left-sided thoracentesis. EXAM: PORTABLE CHEST 1 VIEW COMPARISON:  Chest x-ray dated July 07, 2022. FINDINGS: Unchanged left chest wall pacemaker. Stable mild cardiomegaly. Decreased now small left pleural effusion status post thoracentesis. No pneumothorax. Improved aeration of the left lower lobe. Unchanged small right pleural effusion with right basilar atelectasis. No acute osseous abnormality. IMPRESSION: 1. Decreased now small left pleural effusion status post thoracentesis. No pneumothorax. 2. Unchanged small right pleural effusion. Electronically Signed   By: Titus Dubin M.D.   On: 07/09/2022 10:31   DG Abd 1 View  Result Date: 07/08/2022 CLINICAL DATA:  [id number] Abdominal distention [id number] Constipation [id number] EXAM: ABDOMEN - 1 VIEW COMPARISON:  07/06/2022 FINDINGS: The bowel gas pattern is nonobstructive. Moderate volume of stool within the colon and rectum. Pancreatic parenchymal calcification again noted. Otherwise, no radio-opaque calculi or other significant radiographic abnormality are seen. IMPRESSION: Nonobstructive bowel gas pattern. Moderate volume of stool within the colon and rectum. Electronically Signed   By: Davina Poke D.O.   On: 07/08/2022 15:04   CUP PACEART REMOTE DEVICE CHECK  Result Date: 07/08/2022 Scheduled remote reviewed. Normal device function.  5 NSVT, <  20 beats Next remote 91 days. LA  DG Chest 2 View  Result Date: 07/07/2022 CLINICAL DATA:  89 year old male history  of pleural effusion. EXAM: CHEST - 2 VIEW COMPARISON:  Chest x-ray 07/06/2022. FINDINGS: Opacity at the left base which may reflect atelectasis and/or consolidation with superimposed moderate left pleural effusion, slightly decreased compared to the prior study. Previously noted right pleural effusion is decreased in size, now small, with additional areas of atelectasis and/or consolidation in the base of the right lung. No pneumothorax. No evidence of pulmonary edema. Cardiac silhouette is largely obscured. Upper mediastinal contours are within normal limits allowing for patient rotation to the left. Atherosclerotic calcifications in the thoracic aorta. Left-sided pacemaker with lead tips projecting over the expected location of the right atrium and right ventricle. Numerous surgical clips project over the upper abdomen. IMPRESSION: 1. Slight decreased size of small right and moderate left pleural effusions with persistent bibasilar areas of atelectasis and/or consolidation (left-greater-than-right). 2. Aortic atherosclerosis. Electronically Signed   By: Vinnie Langton M.D.   On: 07/07/2022 11:01   ECHOCARDIOGRAM COMPLETE  Result Date: 07/07/2022    ECHOCARDIOGRAM REPORT   Patient Name:   [name] [facility] Date of Exam: 07/07/2022 Medical Rec #:  [id number]        Height:       70.0 in Accession #:    [id number]       Weight:       159.7 lb Date of Birth:  June 27, 1923       BSA:          1.897 m Patient Age:    64 years         BP:           100/70 mmHg Patient Gender: M                HR:           84 bpm. Exam Location:  Inpatient Procedure: 2D Echo, Cardiac Doppler, Color Doppler and Intracardiac            Opacification Agent Indications:    I31.3 Pericardial effusion (noninflammatory)  History:        Patient has prior history of Echocardiogram examinations.                 Stroke, Arrythmias:Atrial Fibrillation; Risk                 Factors:Hypertension and Diabetes.  Sonographer:    Phineas Douglas  Referring Phys: [id number] [place]  1. Left ventricular ejection fraction, by estimation, is 50 to 55%. The left ventricle has low normal function. The left ventricle has no regional wall motion abnormalities. There is severe left ventricular hypertrophy. Left ventricular diastolic parameters are indeterminate.  2. Pacing wire in RA/RV . Right ventricular systolic function is normal. The right ventricular size is normal. There is normal pulmonary artery systolic pressure.  3. Left atrial size was moderately dilated.  4. IVC dilated but does reduce on inpspiration no RARV diastolic collapse Effuson appears larger than seen on TTE 12/18/21 . Moderate pericardial effusion. The pericardial effusion is near circumferential.  5. The mitral valve is abnormal. Mild mitral valve regurgitation. No evidence of mitral stenosis.  6. The aortic valve is tricuspid. There is moderate calcification of the aortic valve. There is moderate thickening of the aortic valve. Aortic valve regurgitation is mild. Aortic valve sclerosis is present, with no evidence of aortic valve stenosis.  7. Aortic dilatation  noted. There is mild dilatation of the aortic root, measuring 40 mm.  8. The inferior vena cava is normal in size with greater than 50% respiratory variability, suggesting right atrial pressure of 3 mmHg. FINDINGS  Left Ventricle: Left ventricular ejection fraction, by estimation, is 50 to 55%. The left ventricle has low normal function. The left ventricle has no regional wall motion abnormalities. The left ventricular internal cavity size was normal in size. There is severe left ventricular hypertrophy. Left ventricular diastolic parameters are indeterminate. Right Ventricle: Pacing wire in RA/RV. The right ventricular size is normal. No increase in right ventricular wall thickness. Right ventricular systolic function is normal. There is normal pulmonary artery systolic pressure. The tricuspid regurgitant velocity is 2.57  m/s, and with an assumed right atrial pressure of 3 mmHg, the estimated right ventricular systolic pressure is [id number]. Left Atrium: Left atrial size was moderately dilated. Right Atrium: Right atrial size was normal in size. Pericardium: IVC dilated but does reduce on inpspiration no RARV diastolic collapse Effuson appears larger than seen on TTE 12/18/21. A moderately sized pericardial effusion is present. The pericardial effusion is near circumferential. Mitral Valve: The mitral valve is abnormal. There is mild thickening of the mitral valve leaflet(s). There is mild calcification of the mitral valve leaflet(s). Mild mitral annular calcification. Mild mitral valve regurgitation. No evidence of mitral valve stenosis. Tricuspid Valve: The tricuspid valve is normal in structure. Tricuspid valve regurgitation is mild . No evidence of tricuspid stenosis. Aortic Valve: The aortic valve is tricuspid. There is moderate calcification of the aortic valve. There is moderate thickening of the aortic valve. Aortic valve regurgitation is mild. Aortic valve sclerosis is present, with no evidence of aortic valve stenosis. Pulmonic Valve: The pulmonic valve was normal in structure. Pulmonic valve regurgitation is not visualized. No evidence of pulmonic stenosis. Aorta: Aortic dilatation noted. There is mild dilatation of the aortic root, measuring 40 mm. Venous: The inferior vena cava is normal in size with greater than 50% respiratory variability, suggesting right atrial pressure of 3 mmHg. IAS/Shunts: No atrial level shunt detected by color flow Doppler.  LEFT VENTRICLE PLAX 2D LVIDd:         4.30 cm   Diastology LVIDs:         3.10 cm   LV e' medial:    5.17 cm/s LV PW:         1.50 cm   LV E/e' medial:  10.9 LV IVS:        1.60 cm   LV e' lateral:   6.66 cm/s LVOT diam:     1.90 cm   LV E/e' lateral: 8.5 LV SV:         35 LV SV Index:   19 LVOT Area:     2.84 cm  RIGHT VENTRICLE            IVC RV Basal diam:  3.80 cm    IVC  diam: 2.10 cm RV S prime:     8.41 cm/s TAPSE (M-mode): 1.1 cm LEFT ATRIUM              Index        RIGHT ATRIUM           Index LA diam:        4.40 cm  2.32 cm/m   RA Area:     19.90 cm LA Vol (A2C):   100.0 ml 52.72 ml/m  RA Volume:   53.30 ml  28.10  ml/m LA Vol (A4C):   94.4 ml  49.76 ml/m LA Biplane Vol: 97.2 ml  51.24 ml/m  AORTIC VALVE LVOT Vmax:   64.00 cm/s LVOT Vmean:  40.300 cm/s LVOT VTI:    0.124 m  AORTA Ao Root diam: 4.00 cm Ao Asc diam:  3.70 cm MITRAL VALVE               TRICUSPID VALVE MV Area (PHT): 2.60 cm    TR Peak grad:   26.4 mmHg MV Decel Time: 292 msec    TR Vmax:        257.00 cm/s MV E velocity: 56.60 cm/s                            SHUNTS                            Systemic VTI:  0.12 m                            Systemic Diam: 1.90 cm Jenkins Rouge MD Electronically signed by Jenkins Rouge MD Signature Date/Time: 07/07/2022/10:10:07 AM    Final    CT CHEST ABDOMEN PELVIS WO CONTRAST  Result Date: 07/06/2022 CLINICAL DATA:  Abdominal pain. No bowel movement for 4 days. Also with shortness of breath and pleural effusions. History of small-bowel obstruction. EXAM: CT CHEST, ABDOMEN AND PELVIS WITHOUT CONTRAST TECHNIQUE: Multidetector CT imaging of the chest, abdomen and pelvis was performed following the standard protocol without IV contrast. RADIATION DOSE REDUCTION: This exam was performed according to the departmental dose-optimization program which includes automated exposure control, adjustment of the mA and/or kV according to patient size and/or use of iterative reconstruction technique. COMPARISON:  07/29/2021.  Current chest radiograph. FINDINGS: CT CHEST FINDINGS Cardiovascular: Heart top-normal in size. Moderate pericardial effusion. Three-vessel coronary artery calcifications. Right ventricle and right atrial pacer leads are well positioned. Thoracic aorta normal in caliber. Mildly enlarged main pulmonary arteries, right 3 cm, left 3.2 cm. Mild aortic atherosclerosis.  Mediastinum/Nodes: No neck base, mediastinal or hilar masses. No enlarged lymph nodes. Trachea unremarkable. Mild distension of the esophagus. Small hiatal hernia. Lungs/Pleura: Moderate left and small to moderate right pleural effusions. Dependent lung opacities, most evident in the left lower lobe, consistent with atelectasis. No convincing pneumonia. No pulmonary edema. Airways widely patent. 2-3 mm nodule, right upper lobe, image 58/6. Several small right lung calcified granuloma. Mild to moderate centrilobular and paraseptal emphysema. No pneumothorax. Musculoskeletal: No acute fractures. Old posterior left rib fractures. No bone lesion. No chest wall mass. CT ABDOMEN PELVIS FINDINGS Hepatobiliary: No focal liver abnormality is seen. Status post cholecystectomy. No biliary dilatation. Pancreas: Atrophic pancreas with calcifications, but no convincing mass or active inflammation. Duct dilation. Appearance similar to the prior CT. Spleen: Lobulated, relatively small spleen. No splenic mass. Stable appearance. Adrenals/Urinary Tract: No adrenal mass. Right renal cortical thinning. Normal left renal size. Multiple low-attenuation right renal masses consistent with cysts, stable with no follow-up recommended. No stones. No hydronephrosis. Ureters normal in course and in caliber. Bladder minimally distended, otherwise unremarkable. Stomach/Bowel: Right anterior abdominal wall hernias, 2 adjacent hernias in the right mid to upper abdomen for lateral with a base of 2 cm, more medial with a base of approximately 4 cm. Third hernia right periumbilical, base proximally 2.6 cm. All 3 hernias contain bowel. The smaller right upper  abdominal wall hernia contains a knuckle of the right transverse colon. The other 2 hernias contain portions of small bowel. Small bowel distal to the right paraumbilical hernia is decompressed. Small bowel within the 2 hernias and proximal to the hernia in the right mid to lower abdomen is  dilated maximum approximately 3.8 cm. No small bowel wall thickening or adjacent inflammation. Small bowel anastomosis staple line noted right pelvis, stable from the prior CT. Vascular/Lymphatic: Aortic atherosclerosis. Low-attenuation mass versus a retrocrural lymph node lies to the right of the upper aorta just below the aortic hiatus, 2.5 x 2.1 cm transversely, unchanged from the prior CT. This may reflect a lymphocele. No definite enlarged lymph nodes. Reproductive: Small prostate, stable. Other: Diffuse subcutaneous edema.  No ascites. Musculoskeletal: No fracture or acute finding.  No bone lesion. IMPRESSION: 1. Moderate pericardial effusion. Moderate left and small to moderate right pleural effusions. Associated dependent atelectasis most evident in the left lower lobe. 2. No convincing pneumonia and no evidence of pulmonary edema. 3. 3 mm right upper lobe pulmonary nodule. Given the patient's age, in the presence multiple small calcified granuloma, no follow-up recommended. 4. Emphysema. Three anterior abdominal wall hernias, which were present on the prior CT. To hernias contain small bowel with evidence of partial obstruction at the level of the right periumbilical hernia. Small bowel obstructive changes are less pronounced than on the prior CT. No evidence of bowel wall edema or inflammation. 5. No other acute abnormality within the abdomen or pelvis. 6. Nonspecific diffuse subcutaneous soft tissue edema. 7. Other chronic findings as detailed stable from the prior CT. Electronically Signed   By: Lajean Manes M.D.   On: 07/06/2022 14:01   DG Chest Port 1 View  Result Date: 07/06/2022 CLINICAL DATA:  Short of breath. EXAM: PORTABLE CHEST 1 VIEW COMPARISON:  03/09/2022. FINDINGS: Moderate left and small right pleural effusions obscure the hemidiaphragms, part of the right and most of the left cardiac borders. Additional opacity at the lung bases is likely atelectasis. Mid to upper right and upper left  lungs are clear. No pneumothorax. Cardiac silhouette is mostly obscured. 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 No convincing mediastinal or hilar masses. Skeletal structures are grossly intact. IMPRESSION: 1. Moderate left and small right pleural effusions associated with lung base opacity, the latter finding consistent with atelectasis. Consider pneumonia if there are consistent clinical findings. No evidence of pulmonary edema. 2. Pleural effusions have significantly increased when compared to the prior chest radiograph. Electronically Signed   By: Lajean Manes M.D.   On: 07/06/2022 13:04     Subjective: No acute issues or events overnight denies nausea vomiting diarrhea constipation any fevers chills or chest pain   Discharge Exam: Vitals:   07/29/22 0423 07/29/22 0738  BP: 104/72 117/83  Pulse: 92 97  Resp: 16 16  Temp: 97.8 F (36.6 C) 98 F (36.7 C)  SpO2: 96% 99%   Vitals:   07/28/22 2100 07/29/22 0423 07/29/22 0500 07/29/22 0738  BP: 129/72 104/72  117/83  Pulse: 89 92  97  Resp: '16 16  16  '$ Temp: 97.9 F (36.6 C) 97.8 F (36.6 C)  98 F (36.7 C)  TempSrc: Oral Oral  Oral  SpO2: 96% 96%  99%  Weight:   67.5 kg   Height:        General: Pt is alert, awake, not in acute distress Cardiovascular: RRR, S1/S2 +, no rubs, no gallops Respiratory: CTA bilaterally, no wheezing, no rhonchi  Abdominal: Soft, NT, ND, bowel sounds + Extremities: Trace edema, no cyanosis    The results of significant diagnostics from this hospitalization (including imaging, microbiology, ancillary and laboratory) are listed below for reference.     Microbiology: Recent Results (from the past 240 hour(s))  Resp panel by RT-PCR (RSV, Flu A&B, Covid) Anterior Nasal Swab     Status: None   Collection Time: 07/22/22  3:58 PM   Specimen: Anterior Nasal Swab  Result Value Ref Range Status   SARS Coronavirus 2 by RT PCR NEGATIVE NEGATIVE Final   Influenza A by PCR NEGATIVE NEGATIVE  Final   Influenza B by PCR NEGATIVE NEGATIVE Final    Comment: (NOTE) The Xpert Xpress SARS-CoV-2/FLU/RSV plus assay is intended as an aid in the diagnosis of influenza from Nasopharyngeal swab specimens and should not be used as a sole basis for treatment. Nasal washings and aspirates are unacceptable for Xpert Xpress SARS-CoV-2/FLU/RSV testing.  Fact Sheet for Patients: EntrepreneurPulse.com.au  Fact Sheet for Healthcare Providers: IncredibleEmployment.be  This test is not yet approved or cleared by the [place] FDA and has been authorized for detection and/or diagnosis of SARS-CoV-2 by FDA under an Emergency Use Authorization (EUA). This EUA will remain in effect (meaning this test can be used) for the duration of the COVID-19 declaration under Section 564(b)(1) of the Act, 21 U.S.C. section 360bbb-3(b)(1), unless the authorization is terminated or revoked.     Resp Syncytial Virus by PCR NEGATIVE NEGATIVE Final    Comment: (NOTE) Fact Sheet for Patients: EntrepreneurPulse.com.au  Fact Sheet for Healthcare Providers: IncredibleEmployment.be  This test is not yet approved or cleared by the [place] FDA and has been authorized for detection and/or diagnosis of SARS-CoV-2 by FDA under an Emergency Use Authorization (EUA). This EUA will remain in effect (meaning this test can be used) for the duration of the COVID-19 declaration under Section 564(b)(1) of the Act, 21 U.S.C. section 360bbb-3(b)(1), unless the authorization is terminated or revoked.  Performed at [facility], [place] [address], [place], [place] [address]   Culture, body fluid w Gram Stain-bottle     Status: Abnormal   Collection Time: 07/24/22  4:34 PM   Specimen: Fluid  Result Value Ref Range Status   Specimen Description FLUID PLEURAL LEFT  Final   Special Requests BOTTLES DRAWN AEROBIC AND ANAEROBIC  Final   Gram Stain    Final    GRAM POSITIVE COCCI IN CLUSTERS ANAEROBIC BOTTLE ONLY CRITICAL RESULT CALLED TO, READ BACK BY AND VERIFIED WITH: RN Virgil Benedict ON 07/25/22 @ 1424 BY DRT Performed at [facility], [place] [address], [place], [place] [address]    Culture STAPHYLOCOCCUS HOMINIS (A)  Final   Report Status 07/28/2022 FINAL  Final   Organism ID, Bacteria STAPHYLOCOCCUS HOMINIS  Final      Susceptibility   Staphylococcus hominis - MIC*    CIPROFLOXACIN <=0.5 SENSITIVE Sensitive     ERYTHROMYCIN >=8 RESISTANT Resistant     GENTAMICIN <=0.5 SENSITIVE Sensitive     OXACILLIN RESISTANT Resistant     TETRACYCLINE <=1 SENSITIVE Sensitive     VANCOMYCIN 1 SENSITIVE Sensitive     TRIMETH/SULFA 40 SENSITIVE Sensitive     CLINDAMYCIN RESISTANT Resistant     RIFAMPIN <=0.5 SENSITIVE Sensitive     Inducible Clindamycin POSITIVE Resistant     * STAPHYLOCOCCUS HOMINIS  Gram stain     Status: None   Collection Time: 07/24/22  4:34 PM   Specimen: Fluid  Result Value Ref Range Status   Specimen Description FLUID PLEURAL LEFT  Final   Special Requests NONE  Final   Gram Stain   Final    WBC PRESENT, PREDOMINANTLY PMN NO ORGANISMS SEEN CYTOSPIN SMEAR Performed at [facility], [address], [place], [place] [address]    Report Status 07/24/2022 FINAL  Final  Body fluid culture w Gram Stain     Status: None (Preliminary result)   Collection Time: 07/25/22  1:11 PM   Specimen: Lung, Right; Pleural Fluid  Result Value Ref Range Status   Specimen Description FLUID PLEURAL RIGHT  Final   Special Requests NONE  Final   Gram Stain NO WBC SEEN NO ORGANISMS SEEN   Final   Culture   Final    NO GROWTH 3 DAYS Performed at [facility], [place] [address] [place], [place] [address]    Report Status PENDING  Incomplete     Labs: BNP (last 3 results) Recent Labs    07/06/22 1230 07/22/22 1557 07/25/22 0627  BNP 365.0* 326.5* [id number]*   Basic Metabolic Panel: Recent Labs  Lab  07/25/22 0627 07/26/22 0549 07/27/22 0449 07/28/22 0429 07/29/22 0327  NA 138 139 139 140 140  K 4.5 4.5 4.8 4.9 5.3*  CL 108 107 108 109 109  CO2 '23 26 23 '$ 21* 21*  GLUCOSE 115* 148* 117* 120* 171*  BUN 31* 29* 30* 32* 37*  CREATININE 2.22* 2.11* 1.99* 2.08* 2.12*  CALCIUM 8.0* 7.9* 7.9* 8.0* 7.8*  MG 2.0 1.9 1.9 2.0 2.0  PHOS 3.3 3.1 3.0 3.6 3.7   Liver Function Tests: Recent Labs  Lab 07/22/22 1557 07/23/22 0945 07/25/22 0627  AST 17 14* 14*  ALT '8 8 9  '$ ALKPHOS 47 58 44  BILITOT 0.3 0.7 0.6  PROT 5.2* 5.8* 5.2*  ALBUMIN 2.4* 2.7* 2.7*   No results for input(s): "LIPASE", "AMYLASE" in the last 168 hours. No results for input(s): "AMMONIA" in the last 168 hours. CBC: Recent Labs  Lab 07/22/22 1557 07/23/22 0945 07/25/22 0627 07/26/22 0549 07/27/22 0449 07/28/22 0429 07/29/22 0327  WBC 8.6 6.5 6.3 9.3 7.7 7.0 7.4  NEUTROABS 6.2 4.7 4.5  --   --   --   --   HGB 9.0* 9.8* 9.1* 9.5* 9.0* 8.7* 9.0*  HCT 27.4* 28.5* 27.6* 28.7* 27.5* 26.0* 25.9*  MCV 77.8* 74.4* 75.6* 76.1* 76.2* 75.1* 74.6*  PLT 182 192 164 168 136* 138* 145*   Cardiac Enzymes: No results for input(s): "CKTOTAL", "CKMB", "CKMBINDEX", "TROPONINI" in the last 168 hours. BNP: Invalid input(s): "POCBNP" CBG: Recent Labs  Lab 07/28/22 1146 07/28/22 1614 07/28/22 2046 07/29/22 0906 07/29/22 1146  GLUCAP 150* 208* 164* 138* 210*   D-Dimer No results for input(s): "DDIMER" in the last 72 hours. Hgb A1c No results for input(s): "HGBA1C" in the last 72 hours. Lipid Profile No results for input(s): "CHOL", "HDL", "LDLCALC", "TRIG", "CHOLHDL", "LDLDIRECT" in the last 72 hours. Thyroid function studies No results for input(s): "TSH", "T4TOTAL", "T3FREE", "THYROIDAB" in the last 72 hours.  Invalid input(s): "FREET3" Anemia work up No results for input(s): "VITAMINB12", "FOLATE", "FERRITIN", "TIBC", "IRON", "RETICCTPCT" in the last 72 hours. Urinalysis    Component Value Date/Time    COLORURINE YELLOW 07/22/2022 1708   APPEARANCEUR CLEAR 07/22/2022 1708   LABSPEC 1.011 07/22/2022 1708   PHURINE 5.0 07/22/2022 1708   GLUCOSEU NEGATIVE 07/22/2022 1708   GLUCOSEU NEGATIVE 10/30/2020 1051   HGBUR NEGATIVE 07/22/2022 1708  BILIRUBINUR NEGATIVE 07/22/2022 1708   BILIRUBINUR neg 08/03/2021 1702   KETONESUR NEGATIVE 07/22/2022 1708   PROTEINUR NEGATIVE 07/22/2022 1708   UROBILINOGEN 1.0 08/03/2021 1702   UROBILINOGEN 0.2 10/30/2020 1051   NITRITE NEGATIVE 07/22/2022 1708   LEUKOCYTESUR NEGATIVE 07/22/2022 1708   Sepsis Labs Recent Labs  Lab 07/26/22 0549 07/27/22 0449 07/28/22 0429 07/29/22 0327  WBC 9.3 7.7 7.0 7.4   Microbiology Recent Results (from the past 240 hour(s))  Resp panel by RT-PCR (RSV, Flu A&B, Covid) Anterior Nasal Swab     Status: None   Collection Time: 07/22/22  3:58 PM   Specimen: Anterior Nasal Swab  Result Value Ref Range Status   SARS Coronavirus 2 by RT PCR NEGATIVE NEGATIVE Final   Influenza A by PCR NEGATIVE NEGATIVE Final   Influenza B by PCR NEGATIVE NEGATIVE Final    Comment: (NOTE) The Xpert Xpress SARS-CoV-2/FLU/RSV plus assay is intended as an aid in the diagnosis of influenza from Nasopharyngeal swab specimens and should not be used as a sole basis for treatment. Nasal washings and aspirates are unacceptable for Xpert Xpress SARS-CoV-2/FLU/RSV testing.  Fact Sheet for Patients: EntrepreneurPulse.com.au  Fact Sheet for Healthcare Providers: IncredibleEmployment.be  This test is not yet approved or cleared by the [place] FDA and has been authorized for detection and/or diagnosis of SARS-CoV-2 by FDA under an Emergency Use Authorization (EUA). This EUA will remain in effect (meaning this test can be used) for the duration of the COVID-19 declaration under Section 564(b)(1) of the Act, 21 U.S.C. section 360bbb-3(b)(1), unless the authorization is terminated or revoked.     Resp  Syncytial Virus by PCR NEGATIVE NEGATIVE Final    Comment: (NOTE) Fact Sheet for Patients: EntrepreneurPulse.com.au  Fact Sheet for Healthcare Providers: IncredibleEmployment.be  This test is not yet approved or cleared by the [place] FDA and has been authorized for detection and/or diagnosis of SARS-CoV-2 by FDA under an Emergency Use Authorization (EUA). This EUA will remain in effect (meaning this test can be used) for the duration of the COVID-19 declaration under Section 564(b)(1) of the Act, 21 U.S.C. section 360bbb-3(b)(1), unless the authorization is terminated or revoked.  Performed at [facility], [place] [address] [place], [place] [address]   Culture, body fluid w Gram Stain-bottle     Status: Abnormal   Collection Time: 07/24/22  4:34 PM   Specimen: Fluid  Result Value Ref Range Status   Specimen Description FLUID PLEURAL LEFT  Final   Special Requests BOTTLES DRAWN AEROBIC AND ANAEROBIC  Final   Gram Stain   Final    GRAM POSITIVE COCCI IN CLUSTERS ANAEROBIC BOTTLE ONLY CRITICAL RESULT CALLED TO, READ BACK BY AND VERIFIED WITH: RN Virgil Benedict ON 07/25/22 @ 1424 BY DRT Performed at [facility], [place] [address] [place], [place] [address]    Culture STAPHYLOCOCCUS HOMINIS (A)  Final   Report Status 07/28/2022 FINAL  Final   Organism ID, Bacteria STAPHYLOCOCCUS HOMINIS  Final      Susceptibility   Staphylococcus hominis - MIC*    CIPROFLOXACIN <=0.5 SENSITIVE Sensitive     ERYTHROMYCIN >=8 RESISTANT Resistant     GENTAMICIN <=0.5 SENSITIVE Sensitive     OXACILLIN RESISTANT Resistant     TETRACYCLINE <=1 SENSITIVE Sensitive     VANCOMYCIN 1 SENSITIVE Sensitive     TRIMETH/SULFA 40 SENSITIVE Sensitive     CLINDAMYCIN RESISTANT Resistant     RIFAMPIN <=0.5 SENSITIVE Sensitive     Inducible  Clindamycin POSITIVE Resistant     * STAPHYLOCOCCUS HOMINIS  Gram stain     Status: None   Collection Time: 07/24/22   4:34 PM   Specimen: Fluid  Result Value Ref Range Status   Specimen Description FLUID PLEURAL LEFT  Final   Special Requests NONE  Final   Gram Stain   Final    WBC PRESENT, PREDOMINANTLY PMN NO ORGANISMS SEEN CYTOSPIN SMEAR Performed at [facility], [place] [address], [place], [place] [address]    Report Status 07/24/2022 FINAL  Final  Body fluid culture w Gram Stain     Status: None (Preliminary result)   Collection Time: 07/25/22  1:11 PM   Specimen: Lung, Right; Pleural Fluid  Result Value Ref Range Status   Specimen Description FLUID PLEURAL RIGHT  Final   Special Requests NONE  Final   Gram Stain NO WBC SEEN NO ORGANISMS SEEN   Final   Culture   Final    NO GROWTH 3 DAYS Performed at [facility], [place] [address] [place], [place] [address]    Report Status PENDING  Incomplete     Time coordinating discharge: Over 30 minutes  SIGNED:   Little Ishikawa, DO [facility] 07/29/2022, 12:45 PM Pager   If 7PM-7AM, please contact night-coverage [email or web address]

## 2022-07-29 NOTE — Progress Notes (Signed)
Mobility Specialist - Progress Note   07/29/22 1106  Mobility  Activity Ambulated with assistance in room  Level of Assistance Contact guard assist, steadying assist  Assistive Device Front wheel walker  Distance Ambulated (ft) 20 ft  Activity Response Tolerated well  Mobility Referral Yes  $Mobility charge 1 Mobility   Pt was received in chair and agreeable to mobility. No complaints throughout session. Pt was returned to chair with all needs met.  Franki Monte  Mobility Specialist Please contact via Solicitor or Rehab office at [phone]

## 2022-07-29 NOTE — TOC Progression Note (Signed)
Transition of Care [facility]) - Progression Note    Patient Details  Name: [name] MRN: [id number] Date of Birth: [birth year]  Transition of Care [facility]) CM/SW Contact  Jinger Neighbors, [place] Phone Number: 07/29/2022, 10:11 AM  Clinical Narrative:     CSW messaged pt's Granddauther to inquire about her picking up pt to transport him to [facility] for SNF admission.   Expected Discharge Plan: [place] Barriers to Discharge: Continued Medical Work up  Expected Discharge Plan and Services In-house Referral: Clinical Social Work Discharge Planning Services: CM Consult Post Acute Care Choice: [place] Living arrangements for the past 2 months: [place]:  (Patient is currently active with PT, RN through [place]) [place]: [place] ([facility])         Social Determinants of Health (SDOH) Interventions [place]: No Food Insecurity (07/23/2022)  Housing: Low Risk  (07/23/2022)  Transportation Needs: No Transportation Needs (07/23/2022)  Utilities: Not At Risk (07/23/2022)  Depression (PHQ2-9): Low Risk  (07/18/2022)  Financial Resource Strain: Low Risk  (05/14/2022)  Physical Activity: Insufficiently Active (05/14/2022)  Stress: No Stress Concern Present (05/14/2022)  Tobacco Use: Medium Risk (07/25/2022)    Readmission Risk Interventions    07/08/2022   11:12 AM  Readmission Risk Prevention Plan  Transportation Screening Complete  Medication Review ([place]) Complete  PCP or Specialist appointment within 3-5 days of discharge Complete  HRI or [place] Complete  SW Recovery Care/Counseling Consult Complete  [place] Not Applicable

## 2022-07-30 ENCOUNTER — Telehealth: Payer: Self-pay

## 2022-07-30 DIAGNOSIS — F0280 Dementia in other diseases classified elsewhere without behavioral disturbance: Secondary | ICD-10-CM | POA: Diagnosis not present

## 2022-07-30 DIAGNOSIS — I5032 Chronic diastolic (congestive) heart failure: Secondary | ICD-10-CM | POA: Diagnosis not present

## 2022-07-30 DIAGNOSIS — I482 Chronic atrial fibrillation, unspecified: Secondary | ICD-10-CM | POA: Diagnosis not present

## 2022-07-30 DIAGNOSIS — G309 Alzheimer's disease, unspecified: Secondary | ICD-10-CM | POA: Diagnosis not present

## 2022-07-30 DIAGNOSIS — I739 Peripheral vascular disease, unspecified: Secondary | ICD-10-CM | POA: Diagnosis not present

## 2022-07-30 LAB — BODY FLUID CULTURE W GRAM STAIN
Culture: NO GROWTH
Gram Stain: NONE SEEN

## 2022-07-30 LAB — CYTOLOGY - NON PAP

## 2022-07-30 NOTE — Transitions of Care (Post Inpatient/ED Visit) (Signed)
   07/30/2022  Name: [name] MRN: [id number] DOB: Apr 29, 1924  Today's TOC FU Call Status: Today's TOC FU Call Status:: Successful TOC FU Call Competed  Transition Care Management Follow-up Telephone Call Date of Discharge: 07/29/22 Discharge Facility: [facility]) Type of Discharge: Inpatient Admission Primary Inpatient Discharge Diagnosis:: Bilateral pleural effusion How have you been since you were released from the hospital?: Better Any questions or concerns?: No  Items Reviewed: Did you receive and understand the discharge instructions provided?: Yes Medications obtained and verified?: Yes (Medications Reviewed) Any new allergies since your discharge?: No Dietary orders reviewed?: No Do you have support at home?: Yes People in Home: other relative(s) Name of Support/Comfort Primary Source: [place] and Equipment/Supplies: [place] Ordered?: NA Has Agency set up a time to come to your home?: No EMR reviewed for Home Health Orders: [place] Not Ordered Any new equipment or medical supplies ordered?: No Were you able to get the equipment/medical supplies?: No Do you have any questions related to the use of the equipment/supplies?: No  Functional Questionnaire: Do you need assistance with bathing/showering or dressing?: Yes Do you need assistance with meal preparation?: Yes Do you need assistance with eating?: Yes Do you have difficulty maintaining continence: Yes Do you need assistance with getting out of bed/getting out of a chair/moving?: Yes Do you have difficulty managing or taking your medications?: Yes  Folllow up appointments reviewed: PCP Follow-up appointment confirmed?: No MD Provider Line [phone] Given: No Specialist Hospital Follow-up appointment confirmed?: No Do you need transportation to your follow-up appointment?: No Do you understand care options if your condition(s) worsen?: Yes-patient  verbalized understanding    SIGNATURE Angeline Slim, BSN, RN

## 2022-08-02 DIAGNOSIS — I739 Peripheral vascular disease, unspecified: Secondary | ICD-10-CM | POA: Diagnosis not present

## 2022-08-02 DIAGNOSIS — G309 Alzheimer's disease, unspecified: Secondary | ICD-10-CM | POA: Diagnosis not present

## 2022-08-02 DIAGNOSIS — F0280 Dementia in other diseases classified elsewhere without behavioral disturbance: Secondary | ICD-10-CM | POA: Diagnosis not present

## 2022-08-02 DIAGNOSIS — I5032 Chronic diastolic (congestive) heart failure: Secondary | ICD-10-CM | POA: Diagnosis not present

## 2022-08-02 DIAGNOSIS — I482 Chronic atrial fibrillation, unspecified: Secondary | ICD-10-CM | POA: Diagnosis not present

## 2022-08-05 ENCOUNTER — Other Ambulatory Visit: Payer: Self-pay

## 2022-08-05 DIAGNOSIS — I739 Peripheral vascular disease, unspecified: Secondary | ICD-10-CM | POA: Diagnosis not present

## 2022-08-05 DIAGNOSIS — I482 Chronic atrial fibrillation, unspecified: Secondary | ICD-10-CM | POA: Diagnosis not present

## 2022-08-05 DIAGNOSIS — I5032 Chronic diastolic (congestive) heart failure: Secondary | ICD-10-CM | POA: Diagnosis not present

## 2022-08-05 DIAGNOSIS — Z515 Encounter for palliative care: Secondary | ICD-10-CM

## 2022-08-05 DIAGNOSIS — G309 Alzheimer's disease, unspecified: Secondary | ICD-10-CM | POA: Diagnosis not present

## 2022-08-05 NOTE — Progress Notes (Signed)
COMMUNITY PALLIATIVE CARE SW NOTE  PATIENT NAME: [name] DOB: May 11, 1924 MRN: [id number]  PRIMARY CARE PROVIDER: Mliss Sax, MD  RESPONSIBLE PARTY:  Acct ID - Guarantor Home Phone Work Phone Relationship Acct Type  [id number] BRION, PRUET* [phone]  Self P/F     [address], [place], [place] [address]   Initial Palliative Care Encounter/Clinical Social Work  [employer] SW completed an initial encounter with patient granddaughter-Keturah to provide education regarding the palliative care program, role in patient's care, visit frequency and contact information. Patient's granddaughter provided  verbal consent to services. She also provided a brief status update on patient and scheduled an follow-up face-to-face visit with the palliative nurse.  Follow-up visit is scheduled for: 08/22/22 @8 :30 am.    Social History   Tobacco Use   Smoking status: Former    Packs/day: 1.50    Years: 15.00    Additional pack years: 0.00    Total pack years: 22.50    Types: Cigarettes    Quit date: 04/16/1955    Years since quitting: 67.3    Passive exposure: Past   Smokeless tobacco: Former    Types: Chew    Quit date: 08/17/1952  Substance Use Topics   Alcohol use: No    Comment: 08/14/2016 "drank beer when I was a teenager"    CODE STATUS: Full Code ADVANCED DIRECTIVES: Yes MOST FORM COMPLETE: No HOSPICE EDUCATION PROVIDED: No  Duration of encounter and documentation: 30 minutes  [employer], LCSW

## 2022-08-06 ENCOUNTER — Ambulatory Visit
Admission: EM | Admit: 2022-08-06 | Discharge: 2022-08-06 | Disposition: A | Discharge status: Home / Self Care | Payer: No Typology Code available for payment source

## 2022-08-06 ENCOUNTER — Ambulatory Visit (INDEPENDENT_AMBULATORY_CARE_PROVIDER_SITE_OTHER): Payer: No Typology Code available for payment source

## 2022-08-06 DIAGNOSIS — M79672 Pain in left foot: Secondary | ICD-10-CM

## 2022-08-06 DIAGNOSIS — S90422A Blister (nonthermal), left great toe, initial encounter: Secondary | ICD-10-CM | POA: Diagnosis not present

## 2022-08-06 DIAGNOSIS — L03119 Cellulitis of unspecified part of limb: Secondary | ICD-10-CM

## 2022-08-06 DIAGNOSIS — S93602A Unspecified sprain of left foot, initial encounter: Secondary | ICD-10-CM | POA: Diagnosis not present

## 2022-08-06 DIAGNOSIS — M7989 Other specified soft tissue disorders: Secondary | ICD-10-CM | POA: Diagnosis not present

## 2022-08-06 DIAGNOSIS — M25572 Pain in left ankle and joints of left foot: Secondary | ICD-10-CM | POA: Diagnosis not present

## 2022-08-06 DIAGNOSIS — L03116 Cellulitis of left lower limb: Secondary | ICD-10-CM

## 2022-08-06 MED ORDER — DOXYCYCLINE HYCLATE 100 MG PO CAPS: 100.0000 mg | ORAL_CAPSULE | Freq: Two times a day (BID) | ORAL | 0 refills | Status: AC

## 2022-08-06 NOTE — ED Triage Notes (Signed)
Patients grandson in law states around 4-5 this morning the patient was getting dressed and patient reports hitting his left foot and knee on a dresser. The patient c/o left foot pain. There is swelling to the left foot and a blister to the left great and 2nd toe.  Home interventions: none
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
## 2022-08-06 NOTE — Discharge Instructions (Addendum)
Start doxycycline twice daily for 7 days for possible foot infection. Rest, elevate, and cool compresses to the foot as needed Please follow-up with your PCP in 2 days for recheck Please go to the emergency room if you develop any worsening symptoms.  This includes but is not limited to worsening swelling or pain, numbness or tingling, or any new concerns that arise

## 2022-08-07 ENCOUNTER — Other Ambulatory Visit: Payer: Self-pay

## 2022-08-07 ENCOUNTER — Telehealth: Payer: Self-pay

## 2022-08-07 ENCOUNTER — Inpatient Hospital Stay (HOSPITAL_COMMUNITY)
Admission: EM | Admit: 2022-08-07 | Discharge: 2022-08-09 | DRG: 603 | Disposition: A | Discharge status: Home Health | Payer: No Typology Code available for payment source | Attending: Internal Medicine | Admitting: Internal Medicine

## 2022-08-07 DIAGNOSIS — E1151 Type 2 diabetes mellitus with diabetic peripheral angiopathy without gangrene: Secondary | ICD-10-CM | POA: Diagnosis present

## 2022-08-07 DIAGNOSIS — I10 Essential (primary) hypertension: Secondary | ICD-10-CM | POA: Diagnosis present

## 2022-08-07 DIAGNOSIS — I482 Chronic atrial fibrillation, unspecified: Secondary | ICD-10-CM | POA: Diagnosis present

## 2022-08-07 DIAGNOSIS — Z950 Presence of cardiac pacemaker: Secondary | ICD-10-CM

## 2022-08-07 DIAGNOSIS — E1122 Type 2 diabetes mellitus with diabetic chronic kidney disease: Secondary | ICD-10-CM | POA: Diagnosis present

## 2022-08-07 DIAGNOSIS — R262 Difficulty in walking, not elsewhere classified: Secondary | ICD-10-CM | POA: Diagnosis present

## 2022-08-07 DIAGNOSIS — I959 Hypotension, unspecified: Secondary | ICD-10-CM | POA: Diagnosis not present

## 2022-08-07 DIAGNOSIS — R0902 Hypoxemia: Secondary | ICD-10-CM | POA: Diagnosis not present

## 2022-08-07 DIAGNOSIS — I4891 Unspecified atrial fibrillation: Secondary | ICD-10-CM | POA: Diagnosis present

## 2022-08-07 DIAGNOSIS — R54 Age-related physical debility: Secondary | ICD-10-CM | POA: Diagnosis present

## 2022-08-07 DIAGNOSIS — E119 Type 2 diabetes mellitus without complications: Secondary | ICD-10-CM

## 2022-08-07 DIAGNOSIS — W228XXA Striking against or struck by other objects, initial encounter: Secondary | ICD-10-CM | POA: Diagnosis present

## 2022-08-07 DIAGNOSIS — Z7984 Long term (current) use of oral hypoglycemic drugs: Secondary | ICD-10-CM

## 2022-08-07 DIAGNOSIS — L03116 Cellulitis of left lower limb: Secondary | ICD-10-CM | POA: Diagnosis not present

## 2022-08-07 DIAGNOSIS — Z87891 Personal history of nicotine dependence: Secondary | ICD-10-CM

## 2022-08-07 DIAGNOSIS — G309 Alzheimer's disease, unspecified: Secondary | ICD-10-CM | POA: Diagnosis present

## 2022-08-07 DIAGNOSIS — F0280 Dementia in other diseases classified elsewhere without behavioral disturbance: Secondary | ICD-10-CM | POA: Diagnosis present

## 2022-08-07 DIAGNOSIS — N1832 Chronic kidney disease, stage 3b: Secondary | ICD-10-CM | POA: Diagnosis present

## 2022-08-07 DIAGNOSIS — L02612 Cutaneous abscess of left foot: Secondary | ICD-10-CM | POA: Diagnosis present

## 2022-08-07 DIAGNOSIS — Z823 Family history of stroke: Secondary | ICD-10-CM

## 2022-08-07 DIAGNOSIS — Z79899 Other long term (current) drug therapy: Secondary | ICD-10-CM

## 2022-08-07 DIAGNOSIS — Z8546 Personal history of malignant neoplasm of prostate: Secondary | ICD-10-CM

## 2022-08-07 DIAGNOSIS — R531 Weakness: Secondary | ICD-10-CM | POA: Diagnosis not present

## 2022-08-07 DIAGNOSIS — N184 Chronic kidney disease, stage 4 (severe): Secondary | ICD-10-CM | POA: Diagnosis not present

## 2022-08-07 DIAGNOSIS — L03032 Cellulitis of left toe: Secondary | ICD-10-CM | POA: Diagnosis present

## 2022-08-07 DIAGNOSIS — I495 Sick sinus syndrome: Secondary | ICD-10-CM | POA: Diagnosis present

## 2022-08-07 DIAGNOSIS — Z9981 Dependence on supplemental oxygen: Secondary | ICD-10-CM

## 2022-08-07 DIAGNOSIS — I480 Paroxysmal atrial fibrillation: Secondary | ICD-10-CM | POA: Diagnosis present

## 2022-08-07 DIAGNOSIS — L0390 Cellulitis, unspecified: Secondary | ICD-10-CM | POA: Diagnosis not present

## 2022-08-07 DIAGNOSIS — Z7901 Long term (current) use of anticoagulants: Secondary | ICD-10-CM

## 2022-08-07 DIAGNOSIS — J9611 Chronic respiratory failure with hypoxia: Secondary | ICD-10-CM | POA: Diagnosis present

## 2022-08-07 DIAGNOSIS — Z8673 Personal history of transient ischemic attack (TIA), and cerebral infarction without residual deficits: Secondary | ICD-10-CM

## 2022-08-07 DIAGNOSIS — J90 Pleural effusion, not elsewhere classified: Secondary | ICD-10-CM | POA: Diagnosis not present

## 2022-08-07 DIAGNOSIS — S9032XA Contusion of left foot, initial encounter: Secondary | ICD-10-CM | POA: Diagnosis present

## 2022-08-07 DIAGNOSIS — I739 Peripheral vascular disease, unspecified: Secondary | ICD-10-CM | POA: Diagnosis present

## 2022-08-07 DIAGNOSIS — I5033 Acute on chronic diastolic (congestive) heart failure: Secondary | ICD-10-CM | POA: Diagnosis present

## 2022-08-07 DIAGNOSIS — I5032 Chronic diastolic (congestive) heart failure: Secondary | ICD-10-CM | POA: Diagnosis present

## 2022-08-07 DIAGNOSIS — R000 Tachycardia, unspecified: Secondary | ICD-10-CM | POA: Diagnosis not present

## 2022-08-07 DIAGNOSIS — Z66 Do not resuscitate: Secondary | ICD-10-CM | POA: Diagnosis present

## 2022-08-07 DIAGNOSIS — I1310 Hypertensive heart and chronic kidney disease without heart failure, with stage 1 through stage 4 chronic kidney disease, or unspecified chronic kidney disease: Secondary | ICD-10-CM | POA: Diagnosis present

## 2022-08-07 LAB — COMPREHENSIVE METABOLIC PANEL
ALT: 10 U/L (ref 0–44)
AST: 16 U/L (ref 15–41)
Albumin: 2.9 g/dL — ABNORMAL LOW (ref 3.5–5.0)
Alkaline Phosphatase: 62 U/L (ref 38–126)
Anion gap: 11 (ref 5–15)
BUN: 34 mg/dL — ABNORMAL HIGH (ref 8–23)
CO2: 23 mmol/L (ref 22–32)
Calcium: 7.7 mg/dL — ABNORMAL LOW (ref 8.9–10.3)
Chloride: 105 mmol/L (ref 98–111)
Creatinine, Ser: 2.2 mg/dL — ABNORMAL HIGH (ref 0.61–1.24)
GFR, Estimated: 27 mL/min — ABNORMAL LOW (ref >60)
Glucose, Bld: 149 mg/dL — ABNORMAL HIGH (ref 70–99)
Potassium: 4.4 mmol/L (ref 3.5–5.1)
Sodium: 139 mmol/L (ref 135–145)
Total Bilirubin: 0.7 mg/dL (ref 0.3–1.2)
Total Protein: 5.9 g/dL — ABNORMAL LOW (ref 6.5–8.1)

## 2022-08-07 LAB — CBC WITH DIFFERENTIAL/PLATELET
Abs Immature Granulocytes: 0.04 10*3/uL (ref 0.00–0.07)
Basophils Absolute: 0 10*3/uL (ref 0.0–0.1)
Basophils Relative: 0 %
Eosinophils Absolute: 0 10*3/uL (ref 0.0–0.5)
Eosinophils Relative: 0 %
HCT: 28.3 % — ABNORMAL LOW (ref 39.0–52.0)
Hemoglobin: 9.3 g/dL — ABNORMAL LOW (ref 13.0–17.0)
Immature Granulocytes: 1 %
Lymphocytes Relative: 7 %
Lymphs Abs: 0.6 10*3/uL — ABNORMAL LOW (ref 0.7–4.0)
MCH: 25.3 pg — ABNORMAL LOW (ref 26.0–34.0)
MCHC: 32.9 g/dL (ref 30.0–36.0)
MCV: 76.9 fL — ABNORMAL LOW (ref 80.0–100.0)
Monocytes Absolute: 1.1 10*3/uL — ABNORMAL HIGH (ref 0.1–1.0)
Monocytes Relative: 13 %
Neutro Abs: 6.7 10*3/uL (ref 1.7–7.7)
Neutrophils Relative %: 79 %
Platelets: 176 10*3/uL (ref 150–400)
RBC: 3.68 MIL/uL — ABNORMAL LOW (ref 4.22–5.81)
RDW: 17.6 % — ABNORMAL HIGH (ref 11.5–15.5)
WBC: 8.5 10*3/uL (ref 4.0–10.5)
nRBC: 0 % (ref 0.0–0.2)

## 2022-08-07 LAB — TROPONIN I (HIGH SENSITIVITY): Troponin I (High Sensitivity): 31 ng/L — ABNORMAL HIGH (ref <18)

## 2022-08-07 MED ORDER — SODIUM CHLORIDE 0.9 % IV SOLN
1.0000 g | Freq: Once | INTRAVENOUS | Status: AC
  Administered 2022-08-08: 1 g via INTRAVENOUS
  Filled 2022-08-07: qty 10

## 2022-08-07 NOTE — ED Provider Notes (Signed)
[facility] Provider Note   CSN: [id number] Arrival date & time: 08/07/22  1938     History  Chief Complaint  Patient presents with   Gen. Weakness/ Lower Leg Cellulitis     [name] is a 89 y.o. male.  Pt complains of redness and swelling to his left leg.  Pt reports 2 days ago he hit his toe.  Pt had a bruise to the end of his toe.  Pt's family member reports pt was seen yesterday at urgent care and had an xray that showed no fracture.  Pt's daughter reports pt has had increased redness and swelling Pt unable to walk today.    The history is provided by the patient. No language interpreter was used.       Home Medications Prior to Admission medications   Medication Sig Start Date End Date Taking? Authorizing Provider  apixaban (ELIQUIS) 2.5 MG TABS tablet Take 1 tablet (2.5 mg total) by mouth 2 (two) times daily. 07/29/22   Little Ishikawa, MD  divalproex (DEPAKOTE SPRINKLE) 125 MG capsule Take 1 capsule (125 mg total) by mouth 2 (two) times daily. 05/27/22 07/22/23  [name], MD  docusate sodium (COLACE) 100 MG capsule Take 1 capsule (100 mg total) by mouth daily. 07/10/22   Amin, Jeanella Flattery, MD  doxycycline (VIBRAMYCIN) 100 MG capsule Take 1 capsule (100 mg total) by mouth 2 (two) times daily for 7 days. 08/06/22 08/13/22  Melynda Ripple, NP  finasteride (PROSCAR) 5 MG tablet TAKE 1 TABLET (5 MG TOTAL) BY MOUTH DAILY. 06/07/22   [name], MD  furosemide (LASIX) 40 MG tablet Take 1 tablet (40 mg total) by mouth daily. 07/30/22   Little Ishikawa, MD  Iron, Ferrous Sulfate, 325 (65 Fe) MG TABS Take 1 tablet every other day. Patient taking differently: Take 1 tablet by mouth every other day. 06/13/22   [name], MD  latanoprost (XALATAN) 0.005 % ophthalmic solution Place 1 drop into both eyes at bedtime. 05/03/20   [provider]  loratadine (CLARITIN) 10 MG tablet Take 10 mg  by mouth daily as needed for rhinitis.    [provider]  Melatonin 3 MG TBDP Take 1 tablet by mouth at bedtime. Patient taking differently: Take 3 mg by mouth at bedtime. 07/10/22   Amin, Jeanella Flattery, MD  memantine (NAMENDA) 10 MG tablet Take 10 mg by mouth 2 (two) times daily.    [provider]  midodrine (PROAMATINE) 2.5 MG tablet Take 1 tablet (2.5 mg total) by mouth 3 (three) times daily with meals. 07/29/22   Little Ishikawa, MD  Multiple Vitamin (MULTIVITAMIN WITH MINERALS) TABS tablet Take 1 tablet by mouth daily. 07/30/22   Little Ishikawa, MD  pantoprazole (PROTONIX) 40 MG tablet TAKE 1 TABLET(40 MG) BY MOUTH DAILY Patient taking differently: Take 40 mg by mouth daily. 01/01/22   [name], MD  pioglitazone (ACTOS) 15 MG tablet Take 15 mg by mouth daily.    [provider]  polyethylene glycol powder (GLYCOLAX/MIRALAX) 17 GM/SCOOP powder Take 17 g by mouth 2 (two) times daily as needed. Patient not taking: Reported on 07/22/2022 08/03/21   Charyl Dancer, NP  tamsulosin (FLOMAX) 0.4 MG CAPS capsule Take 1 capsule (0.4 mg total) by mouth daily. 12/04/21   [name], MD      Allergies    Patient has no known allergies.    Review  of Systems   Review of Systems  Cardiovascular:  Positive for leg swelling.  All other systems reviewed and are negative.   Physical Exam Updated Vital Signs BP 114/76 (BP Location: Right Arm)   Pulse (!) 107   Temp 98 F (36.7 C) (Oral)   Resp 20   Ht 5\' 11"  (1.803 m)   Wt 67.1 kg   SpO2 99%   BMI 20.64 kg/m  Physical Exam Vitals and nursing note reviewed.  Constitutional:      Appearance: He is well-developed.  HENT:     Head: Normocephalic.  Pulmonary:     Effort: Pulmonary effort is normal.  Abdominal:     General: There is no distension.  Musculoskeletal:        General: Swelling and tenderness present.     Cervical back: Normal range of motion.     Comments: Red, tender  left lower leg,  foot erythematous and swollen   Neurological:     Mental Status: He is alert and oriented to person, place, and time.  Psychiatric:        Mood and Affect: Mood normal.     ED Results / Procedures / Treatments   Labs (all labs ordered are listed, but only abnormal results are displayed) Labs Reviewed  CBC WITH DIFFERENTIAL/PLATELET - Abnormal; Notable for the following components:      Result Value   RBC 3.68 (*)    Hemoglobin 9.3 (*)    HCT 28.3 (*)    MCV 76.9 (*)    MCH 25.3 (*)    RDW 17.6 (*)    Lymphs Abs 0.6 (*)    Monocytes Absolute 1.1 (*)    All other components within normal limits  COMPREHENSIVE METABOLIC PANEL - Abnormal; Notable for the following components:   Glucose, Bld 149 (*)    BUN 34 (*)    Creatinine, Ser 2.20 (*)    Calcium 7.7 (*)    Total Protein 5.9 (*)    Albumin 2.9 (*)    GFR, Estimated 27 (*)    All other components within normal limits  TROPONIN I (HIGH SENSITIVITY) - Abnormal; Notable for the following components:   Troponin I (High Sensitivity) 31 (*)    All other components within normal limits  CULTURE, BLOOD (ROUTINE X 2)  CULTURE, BLOOD (ROUTINE X 2)  TROPONIN I (HIGH SENSITIVITY)    EKG None  Radiology DG Ankle Complete Left  Result Date: 08/06/2022 CLINICAL DATA:  Trauma, pain and swelling EXAM: LEFT ANKLE COMPLETE - 3+ VIEW COMPARISON:  None Available. FINDINGS: No displaced fracture or dislocation is seen. There is soft tissue swelling around the ankle. Plantar spur is seen in calcaneus. Small linear calcification is seen at the attachment of Achilles tendon to the calcaneus suggesting tendinosis. IMPRESSION: No recent displaced fracture or dislocation is seen in left ankle. Electronically Signed   By: Elmer Picker M.D.   On: 08/06/2022 15:21   DG Foot Complete Left  Result Date: 08/06/2022 CLINICAL DATA:  Trauma, pain EXAM: LEFT FOOT - COMPLETE 3+ VIEW COMPARISON:  None Available. FINDINGS: No recent  fracture or dislocation is seen. There is marked soft tissue swelling over the dorsum. Plantar spur is seen in calcaneus. Small calcification is seen at the attachment of Achilles tendon to the calcaneus. IMPRESSION: No recent displaced fracture or dislocation is seen in left foot. Electronically Signed   By: Elmer Picker M.D.   On: 08/06/2022 15:20    Procedures Procedures  Medications Ordered in ED Medications - No data to display  ED Course/ Medical Decision Making/ A&P                             Medical Decision Making Pt complains of redness and swelling to his left foot.   Amount and/or Complexity of Data Reviewed Independent Historian: parent and caregiver    Details: Pt is here with his granddaughter who is supportive  External Data Reviewed: notes.    Details: Xrays from urgent care reviewed.  No fracture  Labs: ordered. Decision-making details documented in ED Course.    Details: Labs ordered reviewed and interpreted,  Hemoglobin is 9.3            Final Clinical Impression(s) / ED Diagnoses Final diagnoses:  Cellulitis of left leg    Rx / DC Orders ED Discharge Orders     None         Sidney Ace 08/08/22 0017    Tegeler, Gwenyth Allegra, MD 08/08/22 [phone]

## 2022-08-07 NOTE — Progress Notes (Signed)
  Chronic Care Management   Note  08/07/2022 Name: [name] MRN: [id number] DOB: Mar 18, 1924  [name] is a 89 y.o. year old male who is a primary care patient of [name], MD. I reached out to [name] by phone today in response to a referral sent by [name] PCP.  The first contact attempt was unsuccessful.   Follow up plan: Additional outreach attempts will be made.  Noreene Larsson, [place], [place] [address] Direct Dial: [phone] [email or web address]

## 2022-08-07 NOTE — ED Provider Notes (Incomplete)
[facility] Provider Note   CSN: [id number] Arrival date & time: 08/07/22  1938     History {Add pertinent medical, surgical, social history, OB history to HPI:1} Chief Complaint  Patient presents with  . Gen. Weakness/ Lower Leg Cellulitis     [name] is a 89 y.o. male.  Pt complains of redness and swelling to his left leg.  Pt reports 2 days ago he hit his toe.  Pt had a bruise to the end of his toe.  Pt's family member reports pt was seen yesterday at urgent care and had an xray that showed no fracture.  Pt's daughter reports pt has had increased redness and swelling Pt unable to walk today.    The history is provided by the patient. No language interpreter was used.       Home Medications Prior to Admission medications   Medication Sig Start Date End Date Taking? Authorizing Provider  apixaban (ELIQUIS) 2.5 MG TABS tablet Take 1 tablet (2.5 mg total) by mouth 2 (two) times daily. 07/29/22   Little Ishikawa, MD  divalproex (DEPAKOTE SPRINKLE) 125 MG capsule Take 1 capsule (125 mg total) by mouth 2 (two) times daily. 05/27/22 07/22/23  [name], MD  docusate sodium (COLACE) 100 MG capsule Take 1 capsule (100 mg total) by mouth daily. 07/10/22   Amin, Jeanella Flattery, MD  doxycycline (VIBRAMYCIN) 100 MG capsule Take 1 capsule (100 mg total) by mouth 2 (two) times daily for 7 days. 08/06/22 08/13/22  Melynda Ripple, NP  finasteride (PROSCAR) 5 MG tablet TAKE 1 TABLET (5 MG TOTAL) BY MOUTH DAILY. 06/07/22   [name], MD  furosemide (LASIX) 40 MG tablet Take 1 tablet (40 mg total) by mouth daily. 07/30/22   Little Ishikawa, MD  Iron, Ferrous Sulfate, 325 (65 Fe) MG TABS Take 1 tablet every other day. Patient taking differently: Take 1 tablet by mouth every other day. 06/13/22   [name], MD  latanoprost (XALATAN) 0.005 % ophthalmic solution Place 1 drop into both eyes at bedtime. 05/03/20    [provider]  loratadine (CLARITIN) 10 MG tablet Take 10 mg by mouth daily as needed for rhinitis.    [provider]  Melatonin 3 MG TBDP Take 1 tablet by mouth at bedtime. Patient taking differently: Take 3 mg by mouth at bedtime. 07/10/22   Amin, Jeanella Flattery, MD  memantine (NAMENDA) 10 MG tablet Take 10 mg by mouth 2 (two) times daily.    [provider]  midodrine (PROAMATINE) 2.5 MG tablet Take 1 tablet (2.5 mg total) by mouth 3 (three) times daily with meals. 07/29/22   Little Ishikawa, MD  Multiple Vitamin (MULTIVITAMIN WITH MINERALS) TABS tablet Take 1 tablet by mouth daily. 07/30/22   Little Ishikawa, MD  pantoprazole (PROTONIX) 40 MG tablet TAKE 1 TABLET(40 MG) BY MOUTH DAILY Patient taking differently: Take 40 mg by mouth daily. 01/01/22   [name], MD  pioglitazone (ACTOS) 15 MG tablet Take 15 mg by mouth daily.    [provider]  polyethylene glycol powder (GLYCOLAX/MIRALAX) 17 GM/SCOOP powder Take 17 g by mouth 2 (two) times daily as needed. Patient not taking: Reported on 07/22/2022 08/03/21   Charyl Dancer, NP  tamsulosin (FLOMAX) 0.4 MG CAPS capsule Take 1 capsule (0.4 mg total) by mouth daily. 12/04/21   [name], MD      Allergies  Patient has no known allergies.    Review of Systems   Review of Systems  Cardiovascular:  Positive for leg swelling.  All other systems reviewed and are negative.   Physical Exam Updated Vital Signs BP 114/76 (BP Location: Right Arm)   Pulse (!) 107   Temp 98 F (36.7 C) (Oral)   Resp 20   Ht 5\' 11"  (1.803 m)   Wt 67.1 kg   SpO2 99%   BMI 20.64 kg/m  Physical Exam Vitals and nursing note reviewed.  Constitutional:      Appearance: He is well-developed.  HENT:     Head: Normocephalic.  Pulmonary:     Effort: Pulmonary effort is normal.  Abdominal:     General: There is no distension.  Musculoskeletal:        General: Swelling and tenderness  present.     Cervical back: Normal range of motion.     Comments: Red, tender left lower leg,  foot erythematous and swollen   Neurological:     Mental Status: He is alert and oriented to person, place, and time.  Psychiatric:        Mood and Affect: Mood normal.     ED Results / Procedures / Treatments   Labs (all labs ordered are listed, but only abnormal results are displayed) Labs Reviewed  CBC WITH DIFFERENTIAL/PLATELET - Abnormal; Notable for the following components:      Result Value   RBC 3.68 (*)    Hemoglobin 9.3 (*)    HCT 28.3 (*)    MCV 76.9 (*)    MCH 25.3 (*)    RDW 17.6 (*)    Lymphs Abs 0.6 (*)    Monocytes Absolute 1.1 (*)    All other components within normal limits  COMPREHENSIVE METABOLIC PANEL - Abnormal; Notable for the following components:   Glucose, Bld 149 (*)    BUN 34 (*)    Creatinine, Ser 2.20 (*)    Calcium 7.7 (*)    Total Protein 5.9 (*)    Albumin 2.9 (*)    GFR, Estimated 27 (*)    All other components within normal limits  TROPONIN I (HIGH SENSITIVITY) - Abnormal; Notable for the following components:   Troponin I (High Sensitivity) 31 (*)    All other components within normal limits  CULTURE, BLOOD (ROUTINE X 2)  CULTURE, BLOOD (ROUTINE X 2)  TROPONIN I (HIGH SENSITIVITY)    EKG None  Radiology DG Ankle Complete Left  Result Date: 08/06/2022 CLINICAL DATA:  Trauma, pain and swelling EXAM: LEFT ANKLE COMPLETE - 3+ VIEW COMPARISON:  None Available. FINDINGS: No displaced fracture or dislocation is seen. There is soft tissue swelling around the ankle. Plantar spur is seen in calcaneus. Small linear calcification is seen at the attachment of Achilles tendon to the calcaneus suggesting tendinosis. IMPRESSION: No recent displaced fracture or dislocation is seen in left ankle. Electronically Signed   By: Elmer Picker M.D.   On: 08/06/2022 15:21   DG Foot Complete Left  Result Date: 08/06/2022 CLINICAL DATA:  Trauma, pain EXAM:  LEFT FOOT - COMPLETE 3+ VIEW COMPARISON:  None Available. FINDINGS: No recent fracture or dislocation is seen. There is marked soft tissue swelling over the dorsum. Plantar spur is seen in calcaneus. Small calcification is seen at the attachment of Achilles tendon to the calcaneus. IMPRESSION: No recent displaced fracture or dislocation is seen in left foot. Electronically Signed   By: Prudy Feeler.D.  On: 08/06/2022 15:20    Procedures Procedures  {Document cardiac monitor, telemetry assessment procedure when appropriate:1}  Medications Ordered in ED Medications - No data to display  ED Course/ Medical Decision Making/ A&P   {   Click here for ABCD2, HEART and other calculatorsREFRESH Note before signing :1}                          Medical Decision Making Pt complains of redness and swelling to his left foot.   Amount and/or Complexity of Data Reviewed Independent Historian: parent Labs: ordered. Decision-making details documented in ED Course.    Details: Labs ordered reviewed and interpreted,  Hemoglobin is 9.3    ***  {Document critical care time when appropriate:1} {Document review of labs and clinical decision tools ie heart score, Chads2Vasc2 etc:1}  {Document your independent review of radiology images, and any outside records:1} {Document your discussion with family members, caretakers, and with consultants:1} {Document social determinants of health affecting pt's care:1} {Document your decision making why or why not admission, treatments were needed:1} Final Clinical Impression(s) / ED Diagnoses Final diagnoses:  None    Rx / DC Orders ED Discharge Orders     None

## 2022-08-07 NOTE — ED Triage Notes (Addendum)
Patient arrived with EMS from home , caregiver reported generalized weakness/fatigue today with swelling/redness at left lower leg , CBG= 179, initially hypotensive BP= 88/40 , received NS IV 250 ml by EMS - improved to BP = 110/70 by EMS prior to arrival . Afib per EMS EKG.

## 2022-08-08 ENCOUNTER — Encounter (HOSPITAL_COMMUNITY): Payer: Self-pay | Admitting: Internal Medicine

## 2022-08-08 DIAGNOSIS — F0280 Dementia in other diseases classified elsewhere without behavioral disturbance: Secondary | ICD-10-CM | POA: Diagnosis present

## 2022-08-08 DIAGNOSIS — L03116 Cellulitis of left lower limb: Secondary | ICD-10-CM | POA: Diagnosis present

## 2022-08-08 DIAGNOSIS — G309 Alzheimer's disease, unspecified: Secondary | ICD-10-CM | POA: Diagnosis present

## 2022-08-08 DIAGNOSIS — L02612 Cutaneous abscess of left foot: Secondary | ICD-10-CM | POA: Diagnosis present

## 2022-08-08 DIAGNOSIS — I495 Sick sinus syndrome: Secondary | ICD-10-CM | POA: Diagnosis present

## 2022-08-08 DIAGNOSIS — R54 Age-related physical debility: Secondary | ICD-10-CM | POA: Diagnosis present

## 2022-08-08 DIAGNOSIS — J90 Pleural effusion, not elsewhere classified: Secondary | ICD-10-CM | POA: Diagnosis not present

## 2022-08-08 DIAGNOSIS — I1310 Hypertensive heart and chronic kidney disease without heart failure, with stage 1 through stage 4 chronic kidney disease, or unspecified chronic kidney disease: Secondary | ICD-10-CM | POA: Diagnosis present

## 2022-08-08 DIAGNOSIS — N184 Chronic kidney disease, stage 4 (severe): Secondary | ICD-10-CM | POA: Diagnosis not present

## 2022-08-08 DIAGNOSIS — L03032 Cellulitis of left toe: Secondary | ICD-10-CM | POA: Diagnosis present

## 2022-08-08 DIAGNOSIS — E1122 Type 2 diabetes mellitus with diabetic chronic kidney disease: Secondary | ICD-10-CM | POA: Diagnosis present

## 2022-08-08 DIAGNOSIS — Z823 Family history of stroke: Secondary | ICD-10-CM | POA: Diagnosis not present

## 2022-08-08 DIAGNOSIS — Z9981 Dependence on supplemental oxygen: Secondary | ICD-10-CM | POA: Diagnosis not present

## 2022-08-08 DIAGNOSIS — Z950 Presence of cardiac pacemaker: Secondary | ICD-10-CM | POA: Diagnosis not present

## 2022-08-08 DIAGNOSIS — Z66 Do not resuscitate: Secondary | ICD-10-CM | POA: Diagnosis present

## 2022-08-08 DIAGNOSIS — J9611 Chronic respiratory failure with hypoxia: Secondary | ICD-10-CM | POA: Diagnosis present

## 2022-08-08 DIAGNOSIS — R262 Difficulty in walking, not elsewhere classified: Secondary | ICD-10-CM | POA: Diagnosis present

## 2022-08-08 DIAGNOSIS — Z8546 Personal history of malignant neoplasm of prostate: Secondary | ICD-10-CM | POA: Diagnosis not present

## 2022-08-08 DIAGNOSIS — E1151 Type 2 diabetes mellitus with diabetic peripheral angiopathy without gangrene: Secondary | ICD-10-CM | POA: Diagnosis present

## 2022-08-08 DIAGNOSIS — Z87891 Personal history of nicotine dependence: Secondary | ICD-10-CM | POA: Diagnosis not present

## 2022-08-08 DIAGNOSIS — Z7901 Long term (current) use of anticoagulants: Secondary | ICD-10-CM | POA: Diagnosis not present

## 2022-08-08 DIAGNOSIS — I482 Chronic atrial fibrillation, unspecified: Secondary | ICD-10-CM | POA: Diagnosis present

## 2022-08-08 DIAGNOSIS — Z8673 Personal history of transient ischemic attack (TIA), and cerebral infarction without residual deficits: Secondary | ICD-10-CM | POA: Diagnosis not present

## 2022-08-08 DIAGNOSIS — W228XXA Striking against or struck by other objects, initial encounter: Secondary | ICD-10-CM | POA: Diagnosis present

## 2022-08-08 DIAGNOSIS — Z7984 Long term (current) use of oral hypoglycemic drugs: Secondary | ICD-10-CM | POA: Diagnosis not present

## 2022-08-08 DIAGNOSIS — S9032XA Contusion of left foot, initial encounter: Secondary | ICD-10-CM | POA: Diagnosis present

## 2022-08-08 LAB — GLUCOSE, CAPILLARY
Glucose-Capillary: 129 mg/dL — ABNORMAL HIGH (ref 70–99)
Glucose-Capillary: 150 mg/dL — ABNORMAL HIGH (ref 70–99)
Glucose-Capillary: 129 mg/dL — ABNORMAL HIGH (ref 70–99)
Glucose-Capillary: 86 mg/dL (ref 70–99)
Glucose-Capillary: 62 mg/dL — ABNORMAL LOW (ref 70–99)

## 2022-08-08 LAB — TROPONIN I (HIGH SENSITIVITY): Troponin I (High Sensitivity): 26 ng/L — ABNORMAL HIGH (ref <18)

## 2022-08-08 MED ORDER — MORPHINE SULFATE (PF) 2 MG/ML IV SOLN: 2.0000 mg | INTRAVENOUS | Status: DC | PRN

## 2022-08-08 MED ORDER — INSULIN ASPART 100 UNIT/ML IJ SOLN
0.0000 [IU] | Freq: Three times a day (TID) | INTRAMUSCULAR | Status: DC
  Administered 2022-08-08 – 2022-08-09 (×3): 2 [IU] via SUBCUTANEOUS

## 2022-08-08 MED ORDER — MEMANTINE HCL 10 MG PO TABS
10.0000 mg | ORAL_TABLET | Freq: Two times a day (BID) | ORAL | Status: DC
  Administered 2022-08-08 – 2022-08-09 (×3): 10 mg via ORAL
  Filled 2022-08-08 (×3): qty 1

## 2022-08-08 MED ORDER — ACETAMINOPHEN 325 MG PO TABS: 650.0000 mg | ORAL_TABLET | Freq: Four times a day (QID) | ORAL | Status: DC | PRN

## 2022-08-08 MED ORDER — TAMSULOSIN HCL 0.4 MG PO CAPS
0.4000 mg | ORAL_CAPSULE | Freq: Every day | ORAL | Status: DC
  Administered 2022-08-08 – 2022-08-09 (×2): 0.4 mg via ORAL
  Filled 2022-08-08 (×2): qty 1

## 2022-08-08 MED ORDER — PANTOPRAZOLE SODIUM 40 MG PO TBEC
40.0000 mg | DELAYED_RELEASE_TABLET | Freq: Every day | ORAL | Status: DC
  Administered 2022-08-08 – 2022-08-09 (×2): 40 mg via ORAL
  Filled 2022-08-08 (×2): qty 1

## 2022-08-08 MED ORDER — FERROUS SULFATE 325 (65 FE) MG PO TABS
325.0000 mg | ORAL_TABLET | ORAL | Status: DC
  Administered 2022-08-08: 325 mg via ORAL
  Filled 2022-08-08: qty 1

## 2022-08-08 MED ORDER — APIXABAN 2.5 MG PO TABS
2.5000 mg | ORAL_TABLET | Freq: Two times a day (BID) | ORAL | Status: DC
  Administered 2022-08-08 – 2022-08-09 (×3): 2.5 mg via ORAL
  Filled 2022-08-08 (×3): qty 1

## 2022-08-08 MED ORDER — MELATONIN 3 MG PO TABS
3.0000 mg | ORAL_TABLET | Freq: Every day | ORAL | Status: DC
  Administered 2022-08-08 (×2): 3 mg via ORAL
  Filled 2022-08-08 (×2): qty 1

## 2022-08-08 MED ORDER — DOCUSATE SODIUM 100 MG PO CAPS
100.0000 mg | ORAL_CAPSULE | Freq: Every day | ORAL | Status: DC
  Administered 2022-08-08 – 2022-08-09 (×2): 100 mg via ORAL
  Filled 2022-08-08 (×2): qty 1

## 2022-08-08 MED ORDER — FUROSEMIDE 40 MG PO TABS
40.0000 mg | ORAL_TABLET | Freq: Every day | ORAL | Status: DC
  Administered 2022-08-08 – 2022-08-09 (×2): 40 mg via ORAL
  Filled 2022-08-08 (×2): qty 1

## 2022-08-08 MED ORDER — ACETAMINOPHEN 650 MG RE SUPP: 650.0000 mg | Freq: Four times a day (QID) | RECTAL | Status: DC | PRN

## 2022-08-08 MED ORDER — FINASTERIDE 5 MG PO TABS
5.0000 mg | ORAL_TABLET | Freq: Every day | ORAL | Status: DC
  Administered 2022-08-08 – 2022-08-09 (×2): 5 mg via ORAL
  Filled 2022-08-08 (×2): qty 1

## 2022-08-08 MED ORDER — ONDANSETRON HCL 4 MG/2ML IJ SOLN: 4.0000 mg | Freq: Four times a day (QID) | INTRAMUSCULAR | Status: DC | PRN

## 2022-08-08 MED ORDER — DIVALPROEX SODIUM 125 MG PO CSDR
125.0000 mg | DELAYED_RELEASE_CAPSULE | Freq: Two times a day (BID) | ORAL | Status: DC
  Administered 2022-08-08 – 2022-08-09 (×4): 125 mg via ORAL
  Filled 2022-08-08 (×4): qty 1

## 2022-08-08 MED ORDER — ONDANSETRON HCL 4 MG PO TABS: 4.0000 mg | ORAL_TABLET | Freq: Four times a day (QID) | ORAL | Status: DC | PRN

## 2022-08-08 MED ORDER — CEFAZOLIN SODIUM-DEXTROSE 1-4 GM/50ML-% IV SOLN
1.0000 g | Freq: Two times a day (BID) | INTRAVENOUS | Status: DC
  Administered 2022-08-08 – 2022-08-09 (×3): 1 g via INTRAVENOUS
  Filled 2022-08-08 (×3): qty 50

## 2022-08-08 MED ORDER — LATANOPROST 0.005 % OP SOLN
1.0000 [drp] | Freq: Every day | OPHTHALMIC | Status: DC
  Administered 2022-08-08: 1 [drp] via OPHTHALMIC
  Filled 2022-08-08: qty 2.5

## 2022-08-08 MED ORDER — MIDODRINE HCL 5 MG PO TABS
2.5000 mg | ORAL_TABLET | Freq: Three times a day (TID) | ORAL | Status: DC
  Administered 2022-08-08 – 2022-08-09 (×4): 2.5 mg via ORAL
  Filled 2022-08-08 (×4): qty 1

## 2022-08-08 NOTE — Evaluation (Signed)
Physical Therapy Evaluation Patient Details Name: [name] MRN: [id number] DOB: [birth year] Today's Date: 08/08/2022  History of Present Illness  [name] is a 89 y.o. male presents 3/20 for evaluation of left foot pain.  Patient states yesterday he was walking when he stumbled and he tried to stop himself from falling and twisted his left foot/ankle.    He does have chronic lower extremity edema and reports that has not changed.  Pt followed by Palliative care in the home. PMH:   diastolic heart failure, A-fib/tachybradycardia syndrome status post permanent pacemaker, CKD stage IIIb/IV, diabetes, dementia, known bilateral pleural effusions, chronic hypoxic respite failure on 4 L of oxygen  Clinical Impression  Pt admitted with above diagnosis. Pt was able to ambulate with RW in room with min assist. Pt appears close to his baseline per caregiver that was in room with him. Pt should progress well and be able to go home with 24 hour care as PTA.  Will follow acutely.  Pt currently with functional limitations due to the deficits listed below (see PT Problem List). Pt will benefit from acute skilled PT to increase their independence and safety with mobility to allow discharge.          Recommendations for follow up therapy are one component of a multi-disciplinary discharge planning process, led by the attending physician.  Recommendations may be updated based on patient status, additional functional criteria and insurance authorization.  Follow Up Recommendations Home health PT Can patient physically be transported by private vehicle: Yes    Assistance Recommended at Discharge Frequent or constant Supervision/Assistance  Patient can return home with the following  A little help with walking and/or transfers;A little help with bathing/dressing/bathroom    Equipment Recommendations None recommended by PT  Recommendations for Other Services       Functional Status Assessment  Patient has had a recent decline in their functional status and demonstrates the ability to make significant improvements in function in a reasonable and predictable amount of time.     Precautions / Restrictions Precautions Precautions: Fall Precaution Comments: DNR Restrictions Weight Bearing Restrictions: No      Mobility  Bed Mobility               General bed mobility comments: in chair on arrival    Transfers Overall transfer level: Needs assistance Equipment used: Rolling walker (2 wheels) Transfers: Sit to/from Stand, Bed to chair/wheelchair/BSC Sit to Stand: Min assist           General transfer comment: assist for power up, rise, steady. Caregiver present and states pt needs min assist for transfers and gait at home    Ambulation/Gait Ambulation/Gait assistance: Min assist Gait Distance (Feet): 30 Feet Assistive device: Rolling walker (2 wheels) Gait Pattern/deviations: Step-through pattern, Decreased stride length, Trunk flexed Gait velocity: decr     General Gait Details: assist to steady, cues for upright posture and placement within RW.  Pt O2 91 % on RA throughout.  Pt reported dizziness toward end of walk and BP was 94/65.  Stairs            Wheelchair Mobility    Modified Rankin (Stroke Patients Only)       Balance Overall balance assessment: Needs assistance Sitting-balance support: No upper extremity supported, Feet supported Sitting balance-Leahy Scale: Fair     Standing balance support: Bilateral upper extremity supported, Reliant on assistive device for balance Standing balance-Leahy Scale: Poor Standing balance comment: reliant on external support  snf on RW                             Pertinent Vitals/Pain Pain Assessment Pain Assessment: No/denies pain Faces Pain Scale: No hurt    Home Living Family/patient expects to be discharged to:: Private residence Living Arrangements: Other relatives  (granddaughter and caregivers) Available Help at Discharge: Personal care attendant;Available 24 hours/day Type of Home: House Home Access: Stairs to enter Entrance Stairs-Rails: Psychiatric nurse of Steps: 2 (Per chart review, [facility] in [facility])   Home Layout: One level Home Equipment: Cane - single Barista (2 wheels);BSC/3in1;Wheelchair - manual;Hospital bed Additional Comments: Caregiver present and gave information    Prior Function Prior Level of Function : Needs assist             Mobility Comments: used cane at all times and min assist given all the time. ADLs Comments: Pt requires assist for ADL's at baseline level, has 24 hour care with nursing     Hand Dominance   Dominant Hand: Right    Extremity/Trunk Assessment   Upper Extremity Assessment Upper Extremity Assessment: Defer to OT evaluation    Lower Extremity Assessment Lower Extremity Assessment: Generalized weakness    Cervical / Trunk Assessment Cervical / Trunk Assessment: Kyphotic  Communication   Communication: HOH  Cognition Arousal/Alertness: Awake/alert Behavior During Therapy: WFL for tasks assessed/performed Overall Cognitive Status: History of cognitive impairments - at baseline                                 General Comments: Pt with h/o dementia at baseline, pt was A+O x3 to person, place, oriented to [facility] hospital), time/year but not situation        General Comments      Exercises General Exercises - Lower Extremity Long Arc Quad: AROM, Both, 10 reps, Seated   Assessment/Plan    PT Assessment Patient needs continued PT services  PT Problem List Decreased mobility;Decreased strength;Decreased activity tolerance;Decreased balance;Decreased knowledge of use of DME;Decreased cognition;Decreased safety awareness       PT Treatment Interventions DME instruction;Therapeutic activities;Gait training;Therapeutic  exercise;Patient/family education;Balance training;Functional mobility training;Neuromuscular re-education    PT Goals (Current goals can be found in the Care Plan section)  Acute Rehab PT Goals Patient Stated Goal: home PT Goal Formulation: With patient Time For Goal Achievement: 08/22/22 Potential to Achieve Goals: Good    Frequency Min 2X/week     Co-evaluation               AM-PAC PT "6 Clicks" Mobility  Outcome Measure Help needed turning from your back to your side while in a flat bed without using bedrails?: A Little Help needed moving from lying on your back to sitting on the side of a flat bed without using bedrails?: A Little Help needed moving to and from a bed to a chair (including a wheelchair)?: A Little Help needed standing up from a chair using your arms (e.g., wheelchair or bedside chair)?: A Little Help needed to walk in hospital room?: A Little Help needed climbing 3-5 steps with a railing? : A Lot 6 Click Score: 17    End of Session Equipment Utilized During Treatment: Gait belt Activity Tolerance: Patient tolerated treatment well Patient left: in chair;with call bell/phone within reach;with chair alarm set;with family/visitor present Nurse Communication: Mobility status PT Visit  Diagnosis: Other abnormalities of gait and mobility (R26.89);Muscle weakness (generalized) (M62.81)    Time: [id number] PT Time Calculation (min) (ACUTE ONLY): 24 min   Charges:   PT Evaluation $PT Eval Moderate Complexity: 1 Mod PT Treatments $Gait Training: 8-22 mins        [facility] M,PT Acute Rehab Services [phone]   Alvira Philips 08/08/2022, 2:33 PM

## 2022-08-08 NOTE — TOC Initial Note (Addendum)
Transition of Care [facility]) - Initial/Assessment Note    Patient Details  Name: [name] MRN: [id number] Date of Birth: [birth year]  Transition of Care [facility]) CM/SW Contact:    Curlene Labrum, RN Phone Number: 08/08/2022, 2:38 PM  Clinical Narrative:                 CM met with the patient at the bedside.  No family present at this time.  The patient is active with [facility] for PT, OT and renewed orders placed for continued home health services when he returns home.  DME at the home includes hospital bed, RW, WC.  I called the patient's granddaughter by phone but was unable to reach her.  I left a voicemail message to update her regarding renewed services for [facility] services with [facility] and that patient would likely discharge to home tomorrow.  The patient will likely discharge home by family car.  TOC will continue to follow the patient for [facility] needs - patient will likely discharge tomorrow to home per MD notes.  Expected Discharge Plan: [place] Barriers to Discharge: Continued Medical Work up   Patient Goals and CMS Choice Patient states their goals for this hospitalization and ongoing recovery are:: To get better and go home CMS Medicare.gov Compare Post Acute Care list provided to:: Patient Choice offered to / list presented to : Patient [place] ownership interest in [facility].provided to:: Patient    Expected Discharge Plan and Services     Post Acute Care Choice: [place] arrangements for the past 2 months: [place]: [place] ([place]) Date [place]: 08/08/22 Time [place]: [id number] Representative spoke with at [place]: [place], [place] with  Prior Living Arrangements/Services Living arrangements for the past 2 months: [place] with:: Adult Children (Lives with granddaughter and [place] provides  personal care home health in the home from 50 amd to 430 pm) Patient language and need for interpreter reviewed:: Yes Do you feel safe going back to the place where you live?: Yes      Need for Family Participation in Patient Care: Yes (Comment) Care giver support system in place?: Yes (comment) Current home services: DME, Home OT, Home PT (DME in the home includes PT, OT,  Home health aide services through [place] from 930 am to 430 pm - family provides pm care out of pocket) Criminal Activity/Legal Involvement Pertinent to Current Situation/Hospitalization: No - Comment as needed  Activities of Daily Living Home Assistive Devices/Equipment: Environmental consultant (specify type) ADL Screening (condition at time of admission) Patient's cognitive ability adequate to safely complete daily activities?: Yes Is the patient deaf or have difficulty hearing?: Yes Does the patient have difficulty seeing, even when wearing glasses/contacts?: No Does the patient have difficulty concentrating, remembering, or making decisions?: Yes Patient able to express need for assistance with ADLs?: No Does the patient have difficulty dressing or bathing?: Yes Independently performs ADLs?: No Communication: Independent Dressing (OT): Needs assistance Is this a change from baseline?: Pre-admission baseline Grooming: Needs assistance Is this a change from baseline?: Pre-admission baseline Feeding: Needs assistance Bathing: Needs assistance Is this a change from baseline?: Pre-admission baseline Toileting: Needs assistance Is this a change from baseline?: Pre-admission baseline In/Out Bed: Needs assistance  Is this a change from baseline?: Pre-admission baseline Walks in Home: Needs assistance Is this a change from baseline?: Pre-admission baseline Does the patient have difficulty walking or climbing stairs?: Yes Weakness of Legs: Both Weakness of Arms/Hands: None  Permission Sought/Granted Permission sought to share  information with : Case Manager Permission granted to share information with : Yes, Verbal Permission Granted              Emotional Assessment Appearance:: Appears stated age Attitude/Demeanor/Rapport: Gracious Affect (typically observed): Accepting Orientation: : Oriented to Self Alcohol / Substance Use: Not Applicable Psych Involvement: No (comment)  Admission diagnosis:  Cellulitis of left leg [L03.116] Cellulitis and abscess of toe of left foot [L03.032, L02.612] Left leg cellulitis [id number] Patient Active Problem List   Diagnosis Date Noted   Cellulitis and abscess of toe of left foot 08/08/2022   Left leg cellulitis 08/08/2022   Bilateral pleural effusion 07/22/2022   DNR (do not resuscitate)/DNI(Do Not Intubate) 07/22/2022   Edema due to hypoalbuminemia 07/22/2022   Chronic hypoxic respiratory failure, on home oxygen therapy ([name]) - 4 L/min 07/22/2022   Pressure injury of skin 07/10/2022   Essential hypertension 07/06/2022   Alzheimer disease ([place]) 07/06/2022   Pleural effusion 07/06/2022   History of CVA (cerebrovascular accident) 07/06/2022   Ventral hernia 07/06/2022   Ulcer of lower extremity, limited to breakdown of skin ([place]) 05/30/2022   Urinary frequency 02/14/2022   Need for influenza vaccination 02/07/2022   Anemia due to chronic kidney disease 12/31/2021   Agitation due to dementia ([place]) 12/25/2021   Benign prostatic hyperplasia with urinary obstruction 12/05/2021   Lower extremity edema 12/05/2021   Scrotum swelling 12/05/2021   Elevated homocysteine 11/30/2021   Low magnesium level 11/30/2021   Balanitis 11/30/2021   Chronic atrial fibrillation with RVR ([place]) 07/30/2021   Hypophosphatemia 07/29/2021   Stage 3b chronic kidney disease ([place]) 04/25/2021   Continuous leakage of urine 04/25/2021   At high risk for injury related to fall 04/25/2021   Hypocalcemia 10/30/2020   Vitamin D deficiency 10/30/2020   Edema due to malnutrition ([place]) 10/30/2020    Unsteady gait when walking 09/21/2020   Sarcopenia 09/21/2020   B12 deficiency 01/27/2020   Chronic diastolic CHF (congestive heart failure) ([place]) 03/09/2019   Pain in both feet 11/17/2018   Neuropathy 10/09/2018   Insomnia 10/09/2018   Slow transit constipation 06/18/2018   Iron deficiency anemia 11/25/2017   Decreased appetite 10/16/2017   Hearing difficulty of both ears 10/16/2017   Allergic rhinitis due to pollen 09/03/2017   Ventricular tachycardia ([place]) 01/14/2017   Malnutrition of moderate degree 01/13/2017   DOE (dyspnea on exertion) 01/12/2017   PVD (peripheral vascular disease) ([place]) 08/14/2016   CVA (cerebral infarction) 07/23/2015   Microcytic anemia 07/23/2015   Stroke (cerebrum) ([place]) 07/23/2015   Chronic kidney disease 07/23/2015   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 03/15/2015   PPM-Medtronic 03/13/2010   Type 2 diabetes mellitus without complication, without long-term current use of insulin ([place]) 11/14/2008   Atrial fibrillation ([place]) 11/14/2008   BRADYCARDIA-TACHYCARDIA SYNDROME s/p PPM 11/14/2008   PCP:  [name], MD Pharmacy:   [id number] [address] [place] [address] Phone: [phone] Fax: [phone]     Social Determinants of Health (SDOH) Social History: SDOH Screenings   Food Insecurity: No Food Insecurity (08/08/2022)  Housing: Low Risk  (08/08/2022)  Transportation Needs: No Transportation Needs (08/08/2022)  Utilities: Not At Risk (08/08/2022)  Depression (PHQ2-9): Low Risk  (07/18/2022)  Financial Resource Strain: Low Risk  (05/14/2022)  Physical Activity: Insufficiently Active (05/14/2022)  Stress: No Stress Concern Present (05/14/2022)  Tobacco Use: Medium Risk (08/08/2022)   SDOH Interventions:     Readmission Risk Interventions    08/08/2022    2:30 PM 07/08/2022   11:12 AM  Readmission Risk Prevention Plan  Transportation Screening Complete Complete   Medication Review Press photographer) Complete Complete  PCP or Specialist appointment within 3-5 days of discharge Complete Complete  HRI or [place] Complete Complete  SW Recovery Care/Counseling Consult Complete Complete  Palliative Care Screening Complete Not [place] Not Applicable Not Applicable

## 2022-08-08 NOTE — H&P (Signed)
History and Physical    Patient: [name] [id number] DOB: [birth year] DOA: 08/07/2022 DOS: the patient was seen and examined on 08/08/2022 PCP: [name], MD  Patient coming from: Home  Chief Complaint:  Chief Complaint  Patient presents with   Gen. Weakness/ Lower Leg Cellulitis    HPI: [name] is a 89 y.o. male with medical history significant of chronic atrial fibrillation, status post pacemaker placement, chronic kidney disease stage IV, history of CVA, history of prostate cancer, history of TKA cardia bradycardia syndrome, type 2 diabetes diet-controlled, dementia, bilateral pleural effusion with chronic respiratory failure on chronic oxygen at 4 L, who is currently under palliative care and has been in rehab but now home.  Patient apparently hit his left foot and injured that a few days ago.  He has severe peripheral vascular disease.  Since the injury his legs have become more swollen red and tender.  No fever or chills.  Patient was seen at urgent care and given doxycycline but that does not seem to improve the symptoms.  He was brought in today for for further evaluation and treatment.  He does have reduced blood supply to the limbs.  Apparently he was offered amputation in the past but he declined.  He lives with his granddaughter now.  Vitals appear stable.  At this point we are admitting the patient for treatment of cellulitis of the left foot.  Review of Systems: As mentioned in the history of present illness. All other systems reviewed and are negative. Past Medical History:  Diagnosis Date   Acute CVA (cerebrovascular accident) [facility])    Arthritis    Atrial fibrillation ([place])    CKD (chronic kidney disease)    History of hiatal hernia    Hx SBO    Last 2013 all treated conservatively   Hypertension    Presence of permanent cardiac pacemaker    Prostate cancer ([place])    Stroke ([place]) 2017   left facial drooping noted 08/14/2016    Tachycardia-bradycardia syndrome ([place])    Type II diabetes mellitus ([place])    Ventral hernia    x3   Past Surgical History:  Procedure Laterality Date   ABDOMINAL AORTOGRAM W/LOWER EXTREMITY N/A 08/07/2016   Procedure: Abdominal Aortogram w/Lower Extremity;  Surgeon: Wellington Hampshire, MD;  Location: [place] [facility];  Service: Cardiovascular;  Laterality: N/A;   CHOLECYSTECTOMY OPEN  03/31/2001   Dr Lindon Romp   COLON SURGERY     EXPLORATORY LAPAROTOMY  08/2004   Archie Endo 10/01/2010   HEMORRHOID SURGERY     HERNIA REPAIR     HIATAL HERNIA REPAIR  2002   INCISIONAL HERNIA REPAIR  08/2004   Archie Endo 10/01/2010   INSERT / REPLACE / REMOVE PACEMAKER     IR THORACENTESIS ASP PLEURAL SPACE W/IMG GUIDE  07/23/2022   IR THORACENTESIS ASP PLEURAL SPACE W/IMG GUIDE  07/24/2022   IR THORACENTESIS ASP PLEURAL SPACE W/IMG GUIDE  07/25/2022   PERIPHERAL VASCULAR BALLOON ANGIOPLASTY  08/07/2016   Procedure: Peripheral Vascular Balloon Angioplasty;  Surgeon: Wellington Hampshire, MD;  Location: [facility];  Service: Cardiovascular;;  left popliteal artery   PERIPHERAL VASCULAR INTERVENTION  08/14/2016   popliteal artery/notes 08/14/2016   PERIPHERAL VASCULAR INTERVENTION Left 08/14/2016   Procedure: Peripheral Vascular Intervention;  Surgeon: Wellington Hampshire, MD;  Location: [place] [facility];  Service: Cardiovascular;  Laterality: Left;  popliteal artery   PERMANENT PACEMAKER GENERATOR CHANGE N/A 04/06/2012   Procedure: PERMANENT  PACEMAKER GENERATOR CHANGE;  Surgeon: Evans Lance, MD;  Location: [facility] C[facility];  Service: Cardiovascular;  Laterality: N/A;   PPM GENERATOR CHANGEOUT N/A 02/04/2022   Procedure: PPM GENERATOR CHANGEOUT;  Surgeon: Evans Lance, MD;  Location: [place] [facility];  Service: Cardiovascular;  Laterality: N/A;   SMALL INTESTINE SURGERY  09/16/2004   SBO resection, VH repair   Social History:  reports that he quit smoking about 67 years ago. His smoking use included cigarettes. He has a  22.50 pack-year smoking history. He has been exposed to tobacco smoke. He quit smokeless tobacco use about 70 years ago.  His smokeless tobacco use included chew. He reports that he does not drink alcohol and does not use drugs.  No Known Allergies  Family History  Problem Relation Age of Onset   Pneumonia Father    Stroke Brother    Stroke Sister    Cancer Brother        type unknown    Prior to Admission medications   Medication Sig Start Date End Date Taking? Authorizing Provider  apixaban (ELIQUIS) 2.5 MG TABS tablet Take 1 tablet (2.5 mg total) by mouth 2 (two) times daily. 07/29/22   Little Ishikawa, MD  divalproex (DEPAKOTE SPRINKLE) 125 MG capsule Take 1 capsule (125 mg total) by mouth 2 (two) times daily. 05/27/22 07/22/23  [name], MD  docusate sodium (COLACE) 100 MG capsule Take 1 capsule (100 mg total) by mouth daily. 07/10/22   Amin, Jeanella Flattery, MD  doxycycline (VIBRAMYCIN) 100 MG capsule Take 1 capsule (100 mg total) by mouth 2 (two) times daily for 7 days. 08/06/22 08/13/22  Melynda Ripple, NP  finasteride (PROSCAR) 5 MG tablet TAKE 1 TABLET (5 MG TOTAL) BY MOUTH DAILY. 06/07/22   [name], MD  furosemide (LASIX) 40 MG tablet Take 1 tablet (40 mg total) by mouth daily. 07/30/22   Little Ishikawa, MD  Iron, Ferrous Sulfate, 325 (65 Fe) MG TABS Take 1 tablet every other day. Patient taking differently: Take 1 tablet by mouth every other day. 06/13/22   [name], MD  latanoprost (XALATAN) 0.005 % ophthalmic solution Place 1 drop into both eyes at bedtime. 05/03/20   [provider]  loratadine (CLARITIN) 10 MG tablet Take 10 mg by mouth daily as needed for rhinitis.    [provider]  Melatonin 3 MG TBDP Take 1 tablet by mouth at bedtime. Patient taking differently: Take 3 mg by mouth at bedtime. 07/10/22   Amin, Jeanella Flattery, MD  memantine (NAMENDA) 10 MG tablet Take 10 mg by mouth 2 (two) times daily.    [provider]  midodrine (PROAMATINE) 2.5 MG tablet Take 1 tablet (2.5 mg total) by mouth 3 (three) times daily with meals. 07/29/22   Little Ishikawa, MD  Multiple Vitamin (MULTIVITAMIN WITH MINERALS) TABS tablet Take 1 tablet by mouth daily. 07/30/22   Little Ishikawa, MD  pantoprazole (PROTONIX) 40 MG tablet TAKE 1 TABLET(40 MG) BY MOUTH DAILY Patient taking differently: Take 40 mg by mouth daily. 01/01/22   [name], MD  pioglitazone (ACTOS) 15 MG tablet Take 15 mg by mouth daily.    [provider]  polyethylene glycol powder (GLYCOLAX/MIRALAX) 17 GM/SCOOP powder Take 17 g by mouth 2 (two) times daily as needed. Patient not taking: Reported on 07/22/2022 08/03/21   Charyl Dancer, NP  tamsulosin (FLOMAX) 0.4 MG CAPS capsule Take 1 capsule (0.4 mg total)  by mouth daily. 12/04/21   [name], MD    Physical Exam: Vitals:   08/07/22 2300 08/07/22 2330 08/08/22 0000 08/08/22 0015  BP: 119/73 114/76 109/73 114/71  Pulse:   98   Resp: 19 20 20 16   Temp:   98.7 F (37.1 C)   TempSrc:   Oral   SpO2: 100% 99% 99%   Weight:      Height:       Constitutional: Chronically ill looking, frail no distress NAD, calm, comfortable Eyes: PERRL, lids and conjunctivae normal ENMT: Mucous membranes are moist. Posterior pharynx clear of any exudate or lesions.Normal dentition.  Neck: normal, supple, no masses, no thyromegaly Respiratory: clear to auscultation bilaterally, no wheezing, no crackles. Normal respiratory effort. No accessory muscle use.  Cardiovascular: Regular rate and rhythm, no murmurs / rubs / gallops. No extremity edema. 2+ pedal pulses. No carotid bruits.  Abdomen: no tenderness, no masses palpated. No hepatosplenomegaly. Bowel sounds positive.  Musculoskeletal: Good range of motion, no joint swelling or tenderness, left foot swollen around the ankle and dorsum of the foot, left big toe tender with fluctuant hematoma palpable Skin: no rashes,  lesions, ulcers. No induration Neurologic: CN 2-12 grossly intact. Sensation intact, DTR normal. Strength 5/5 in all 4.  Psychiatric: Normal judgment and insight. Alert and oriented x 3. Normal mood  Data Reviewed:  Patient is afebrile, creatinine is 2.2, glucose 149 and BUN 34, albumin 2.9.  Hemoglobin stable at 9.3 platelets within normal and white count within normal.  X-ray of the ankle and left foot showed no acute fracture.  Assessment and Plan:  #1 cellulitis of the left lower extremity: Patient will be admitted.  Initiate IV Ancef.  He has failed outpatient therapy with doxycycline.  We will monitor his response to the antibiotics.  So far it looks like there is traumatic hematoma.  Cultures have been obtained and will follow.  #2 traumatic hematoma of the left foot: If this gets worse podiatry may be consulted.  In the meantime supportive care only.  Patient is having difficulty with weightbearing due to pain.  May need to get PT consultation.  #3 chronic atrial fibrillation: Patient on anticoagulation.  Rate is controlled.  #4 dementia: Stable no agitation.  Continue Namenda  #5 chronic systolic dysfunction: Continue all chronic home regimen.  Patient on both Lasix and midodrine.  Will not change medication.  #6 history of prostate cancer: Continue Flomax and Proscar.  #7 type 2 diabetes: Non-insulin-dependent.  Will order glycemic control  #8 chronic hypoxic respiratory failure: Patient on oxygen at home.  Uses 4 L.   Advance Care Planning:   Code Status: Prior DNR  Consults: None  Family Communication: Granddaughter  Severity of Illness: The appropriate patient status for this patient is OBSERVATION. Observation status is judged to be reasonable and necessary in order to provide the required intensity of service to ensure the patient's safety. The patient's presenting symptoms, physical exam findings, and initial radiographic and laboratory data in the context of their  medical condition is felt to place them at decreased risk for further clinical deterioration. Furthermore, it is anticipated that the patient will be medically stable for discharge from the hospital within 2 midnights of admission.   AuthorBarbette Merino, MD 08/08/2022 12:19 AM  For on call review [email or web address].

## 2022-08-08 NOTE — Progress Notes (Signed)
Patient was seen and examined.  Admitted by nighttime hospitalist early morning hours.  See assessment plan and agree.  In addition some changes were done.  Medication reconciliation done. 89 year old gentleman with history of chronic A-fib status post pacemaker, therapeutic on Eliquis, CKD stage IV, history of stroke, prostate cancer, type 2 diabetes on Actos, chronic respiratory failure on 4 L oxygen at home lives at home with granddaughter along with palliative care support bumped his left great toe 3 days ago and continues to have pain and swelling and now spread to the dorsum of the left foot so brought to the emergency room.  Just restarted on doxycycline 2 days ago.  X-rays were negative for fractures.  Hematoma with a spreading inflammation: Currently no evidence of underlying abscess.  Continue cefepime today.  Mobilize with PT OT.  Pain relief. Hematoma blister, ProTect and do not puncture. Will monitor today, if pain and swelling improved, anticipate home tomorrow on oral antibiotics. Resume all home medications.  Total time spent: 25 minutes.  Same-day admit.  No charge visit.  Called to update granddaughter, unable.

## 2022-08-08 NOTE — ED Notes (Signed)
ED TO INPATIENT HANDOFF REPORT  ED Nurse Name and Phone #: Mariluz Crespo  S Name/Age/Gender [name] 89 y.o. male Room/Bed: 018C/018C  Code Status   Code Status: DNR  Home/SNF/Other Home Patient oriented to: self, place, time, and situation Is this baseline? Yes   Triage Complete: Triage complete  Chief Complaint Cellulitis and abscess of toe of left foot [L03.032, L02.612]  Triage Note Patient arrived with EMS from home , caregiver reported generalized weakness/fatigue today with swelling/redness at left lower leg , CBG= 179, initially hypotensive BP= 88/40 , received NS IV 250 ml by EMS - improved to BP = 110/70 by EMS prior to arrival . Afib per EMS EKG.    Allergies No Known Allergies  Level of Care/Admitting Diagnosis ED Disposition     ED Disposition  Admit   Condition  --   Comment  Hospital Area: [facility] [[id number]]  Level of Care: Med-Surg [16]  May place patient in observation at [facility] or [place] if equivalent level of care is available:: Yes  Covid Evaluation: Asymptomatic - no recent exposure (last 10 days) testing not required  Diagnosis: Cellulitis and abscess of toe of left foot [id number]  Admitting Physician: Elwyn Reach [[id number]]  Attending Physician: Elwyn Reach [[id number]]          B Medical/Surgery History Past Medical History:  Diagnosis Date   Acute CVA (cerebrovascular accident) ([place])    Arthritis    Atrial fibrillation ([place])    CKD (chronic kidney disease)    History of hiatal hernia    Hx SBO    Last 2013 all treated conservatively   Hypertension    Presence of permanent cardiac pacemaker    Prostate cancer ([place])    Stroke ([place]) 2017   left facial drooping noted 08/14/2016   Tachycardia-bradycardia syndrome ([place])    Type II diabetes mellitus ([place])    Ventral hernia    x3   Past Surgical History:  Procedure Laterality Date   ABDOMINAL AORTOGRAM W/LOWER EXTREMITY N/A 08/07/2016    Procedure: Abdominal Aortogram w/Lower Extremity;  Surgeon: Wellington Hampshire, MD;  Location: [place] [facility];  Service: Cardiovascular;  Laterality: N/A;   CHOLECYSTECTOMY OPEN  03/31/2001   Dr Lindon Romp   COLON SURGERY     EXPLORATORY LAPAROTOMY  08/2004   Archie Endo 10/01/2010   HEMORRHOID SURGERY     HERNIA REPAIR     HIATAL HERNIA REPAIR  2002   INCISIONAL HERNIA REPAIR  08/2004   Archie Endo 10/01/2010   INSERT / REPLACE / REMOVE PACEMAKER     IR THORACENTESIS ASP PLEURAL SPACE W/IMG GUIDE  07/23/2022   IR THORACENTESIS ASP PLEURAL SPACE W/IMG GUIDE  07/24/2022   IR THORACENTESIS ASP PLEURAL SPACE W/IMG GUIDE  07/25/2022   PERIPHERAL VASCULAR BALLOON ANGIOPLASTY  08/07/2016   Procedure: Peripheral Vascular Balloon Angioplasty;  Surgeon: Wellington Hampshire, MD;  Location: [place] [facility];  Service: Cardiovascular;;  left popliteal artery   PERIPHERAL VASCULAR INTERVENTION  08/14/2016   popliteal artery/notes 08/14/2016   PERIPHERAL VASCULAR INTERVENTION Left 08/14/2016   Procedure: Peripheral Vascular Intervention;  Surgeon: Wellington Hampshire, MD;  Location: [place] [facility];  Service: Cardiovascular;  Laterality: Left;  popliteal artery   PERMANENT PACEMAKER GENERATOR CHANGE N/A 04/06/2012   Procedure: PERMANENT PACEMAKER GENERATOR CHANGE;  Surgeon: Evans Lance, MD;  Location: [facility] CATH LAB;  Service: Cardiovascular;  Laterality: N/A;   PPM GENERATOR CHANGEOUT N/A 02/04/2022   Procedure: PPM  GENERATOR CHANGEOUT;  Surgeon: Evans Lance, MD;  Location: [place] [facility];  Service: Cardiovascular;  Laterality: N/A;   SMALL INTESTINE SURGERY  09/16/2004   SBO resection, VH repair     A IV Location/Drains/Wounds Patient Lines/Drains/Airways Status     Active Line/Drains/Airways     Name Placement date Placement time Site Days   Peripheral IV 08/07/22 18 G Right Forearm 08/07/22  --  Forearm  1   Pressure Injury 07/09/22 Buttocks Stage 1 -  Intact skin with non-blanchable redness of a localized  area usually over a bony prominence. 07/09/22  1745  -- 30            Intake/Output Last 24 hours No intake or output data in the 24 hours ending 08/08/22 0042  Labs/Imaging Results for orders placed or performed during the hospital encounter of 08/07/22 (from the past 48 hour(s))  CBC with Differential     Status: Abnormal   Collection Time: 08/07/22  8:48 PM  Result Value Ref Range   WBC 8.5 4.0 - 10.5 K/uL   RBC 3.68 (L) 4.22 - 5.81 MIL/uL   Hemoglobin 9.3 (L) 13.0 - 17.0 g/dL   HCT 28.3 (L) 39.0 - 52.0 %   MCV 76.9 (L) 80.0 - 100.0 fL   MCH 25.3 (L) 26.0 - 34.0 pg   MCHC 32.9 30.0 - 36.0 g/dL   RDW 17.6 (H) 11.5 - 15.5 %   Platelets 176 150 - 400 K/uL   nRBC 0.0 0.0 - 0.2 %   Neutrophils Relative % 79 %   Neutro Abs 6.7 1.7 - 7.7 K/uL   Lymphocytes Relative 7 %   Lymphs Abs 0.6 (L) 0.7 - 4.0 K/uL   Monocytes Relative 13 %   Monocytes Absolute 1.1 (H) 0.1 - 1.0 K/uL   Eosinophils Relative 0 %   Eosinophils Absolute 0.0 0.0 - 0.5 K/uL   Basophils Relative 0 %   Basophils Absolute 0.0 0.0 - 0.1 K/uL   Immature Granulocytes 1 %   Abs Immature Granulocytes 0.04 0.00 - 0.07 K/uL    Comment: Performed at [facility], [address] [place], [place] [address]  Comprehensive metabolic panel     Status: Abnormal   Collection Time: 08/07/22  8:48 PM  Result Value Ref Range   Sodium 139 135 - 145 mmol/L   Potassium 4.4 3.5 - 5.1 mmol/L   Chloride 105 98 - 111 mmol/L   CO2 23 22 - 32 mmol/L   Glucose, Bld 149 (H) 70 - 99 mg/dL    Comment: Glucose reference range applies only to samples taken after fasting for at least 8 hours.   BUN 34 (H) 8 - 23 mg/dL   Creatinine, Ser 2.20 (H) 0.61 - 1.24 mg/dL   Calcium 7.7 (L) 8.9 - 10.3 mg/dL   Total Protein 5.9 (L) 6.5 - 8.1 g/dL   Albumin 2.9 (L) 3.5 - 5.0 g/dL   AST 16 15 - 41 U/L   ALT 10 0 - 44 U/L   Alkaline Phosphatase 62 38 - 126 U/L   Total Bilirubin 0.7 0.3 - 1.2 mg/dL   GFR, Estimated 27 (L) >60 mL/min     Comment: (NOTE) Calculated using the CKD-EPI Creatinine Equation (2021)    Anion gap 11 5 - 15    Comment: Performed at [facility], [place] [address] [place], [place] [address]  Troponin I (High Sensitivity)     Status: Abnormal   Collection Time:  08/07/22  8:48 PM  Result Value Ref Range   Troponin I (High Sensitivity) 31 (H) <18 ng/L    Comment: (NOTE) Elevated high sensitivity troponin I (hsTnI) values and significant  changes across serial measurements may suggest ACS but many other  chronic and acute conditions are known to elevate hsTnI results.  Refer to the "Links" section for chest pain algorithms and additional  guidance. Performed at [facility], [place] [address], [place], [place] [address]   Troponin I (High Sensitivity)     Status: Abnormal   Collection Time: 08/07/22 11:16 PM  Result Value Ref Range   Troponin I (High Sensitivity) 26 (H) <18 ng/L    Comment: (NOTE) Elevated high sensitivity troponin I (hsTnI) values and significant  changes across serial measurements may suggest ACS but many other  chronic and acute conditions are known to elevate hsTnI results.  Refer to the "Links" section for chest pain algorithms and additional  guidance. Performed at [facility], [place] [address] [place], [place] [address]    DG Ankle Complete Left  Result Date: 08/06/2022 CLINICAL DATA:  Trauma, pain and swelling EXAM: LEFT ANKLE COMPLETE - 3+ VIEW COMPARISON:  None Available. FINDINGS: No displaced fracture or dislocation is seen. There is soft tissue swelling around the ankle. Plantar spur is seen in calcaneus. Small linear calcification is seen at the attachment of Achilles tendon to the calcaneus suggesting tendinosis. IMPRESSION: No recent displaced fracture or dislocation is seen in left ankle. Electronically Signed   By: Elmer Picker M.D.   On: 08/06/2022 15:21   DG Foot Complete Left  Result Date: 08/06/2022 CLINICAL DATA:  Trauma, pain EXAM:  LEFT FOOT - COMPLETE 3+ VIEW COMPARISON:  None Available. FINDINGS: No recent fracture or dislocation is seen. There is marked soft tissue swelling over the dorsum. Plantar spur is seen in calcaneus. Small calcification is seen at the attachment of Achilles tendon to the calcaneus. IMPRESSION: No recent displaced fracture or dislocation is seen in left foot. Electronically Signed   By: Elmer Picker M.D.   On: 08/06/2022 15:20    Pending Labs Unresulted Labs (From admission, onward)     Start     Ordered   08/07/22 2038  Blood culture (routine x 2)  BLOOD CULTURE X 2,   R     Question Answer Comment  Patient immune status Normal   Release to patient Immediate      08/07/22 2037            Vitals/Pain Today's Vitals   08/07/22 2300 08/07/22 2330 08/08/22 0000 08/08/22 0015  BP: 119/73 114/76 109/73 114/71  Pulse:   98   Resp: 19 20 20 16   Temp:   98.7 F (37.1 C)   TempSrc:   Oral   SpO2: 100% 99% 99%   Weight:      Height:      PainSc:   0-No pain     Isolation Precautions No active isolations  Medications Medications  furosemide (LASIX) tablet 40 mg (has no administration in time range)  midodrine (PROAMATINE) tablet 2.5 mg (has no administration in time range)  memantine (NAMENDA) tablet 10 mg (has no administration in time range)  docusate sodium (COLACE) capsule 100 mg (has no administration in time range)  pantoprazole (PROTONIX) EC tablet 40 mg (has no administration in time range)  finasteride (PROSCAR) tablet 5 mg (has no administration in time range)  tamsulosin (FLOMAX) capsule 0.4 mg (has no administration  in time range)  apixaban (ELIQUIS) tablet 2.5 mg (has no administration in time range)  Iron (Ferrous Sulfate) TABS 1 tablet (has no administration in time range)  Melatonin TBDP 3 mg (has no administration in time range)  divalproex (DEPAKOTE SPRINKLE) capsule 125 mg (has no administration in time range)  latanoprost (XALATAN) 0.005 % ophthalmic  solution 1 drop (has no administration in time range)  acetaminophen (TYLENOL) tablet 650 mg (has no administration in time range)    Or  acetaminophen (TYLENOL) suppository 650 mg (has no administration in time range)  morphine (PF) 2 MG/ML injection 2 mg (has no administration in time range)  ondansetron (ZOFRAN) tablet 4 mg (has no administration in time range)    Or  ondansetron (ZOFRAN) injection 4 mg (has no administration in time range)  ceFAZolin (ANCEF) IVPB 1 g/50 mL premix (has no administration in time range)  insulin aspart (novoLOG) injection 0-15 Units (has no administration in time range)  cefTRIAXone (ROCEPHIN) 1 g in sodium chloride 0.9 % 100 mL IVPB (1 g Intravenous New Bag/Given 08/08/22 0009)    Mobility walks with device     Focused Assessments     R Recommendations: See Admitting Provider Note  Report given to:   Additional Notes:

## 2022-08-09 DIAGNOSIS — L02612 Cutaneous abscess of left foot: Secondary | ICD-10-CM | POA: Diagnosis not present

## 2022-08-09 DIAGNOSIS — L03032 Cellulitis of left toe: Secondary | ICD-10-CM | POA: Diagnosis not present

## 2022-08-09 LAB — GLUCOSE, CAPILLARY
Glucose-Capillary: 120 mg/dL — ABNORMAL HIGH (ref 70–99)
Glucose-Capillary: 133 mg/dL — ABNORMAL HIGH (ref 70–99)

## 2022-08-09 NOTE — Progress Notes (Signed)
Mobility Specialist - Progress Note   08/09/22 1041  Mobility  Activity Ambulated with assistance in room  Level of Assistance Minimal assist, patient does 75% or more  Assistive Device Front wheel walker  Distance Ambulated (ft) 30 ft  Activity Response Tolerated well  Mobility Referral Yes  $Mobility charge 1 Mobility   Pt was received in bed and agreeable to mobility. No complaints throughout session. Pt was MinA throughout session. Pt was returned to bed with all needs met.   [name]  Mobility Specialist Please contact via Solicitor or Rehab office at [phone]

## 2022-08-09 NOTE — Discharge Summary (Signed)
Physician Discharge Summary  [name] [id number] DOB: 22-Jan-1924 DOA: 08/07/2022  PCP: [name], MD  Admit date: 08/07/2022 Discharge date: 08/09/2022  Admitted From: Home Disposition: Home with home health  Recommendations for Outpatient Follow-up:  Follow up with PCP in 1-2 weeks Will send referral to podiatry for follow-up  Home Health: PT/OT Equipment/Devices: N/A  Discharge Condition: Stable CODE STATUS: DNR Diet recommendation: Low-salt diet  Discharge summary:   89 year old gentleman with history of chronic A-fib status post pacemaker, therapeutic on Eliquis, CKD stage IV, history of stroke, prostate cancer, type 2 diabetes on Actos, chronic respiratory failure on 4 L oxygen at home lives at home with granddaughter along with palliative care support bumped his left great toe 3 days ago and continues to have pain and swelling and now spread to the dorsum of the left foot so brought to the emergency room.  Just started on doxycycline 2 days ago.  X-rays were negative for fractures.   Toe hematoma with spreading inflammation:  Currently no evidence of underlying abscess.  Continue with cefepime.  Pain is controlled.  He does have some surrounding inflammation but no evidence of abscess, osteomyelitis.   Hematoma blister, do not puncture.  Nonweightbearing on the toes. Pain much improved. She will continue with doxycycline that was prescribed. Blister may rupture and may result in open wound, will need wound care.  Will send referral to podiatry to follow-up as outpatient.  Chronic medical issues: Remained stable.  She has good support system at home.  No changes in medications done.  Stable for discharge.    Discharge Diagnoses:  Principal Problem:   Cellulitis and abscess of toe of left foot Active Problems:   Type 2 diabetes mellitus without complication, without long-term current use of insulin ([name])   Chronic diastolic CHF (congestive heart  failure) ([name])   Stage 3b chronic kidney disease ([name])   Essential hypertension   Alzheimer disease ([name])   Chronic hypoxic respiratory failure, on home oxygen therapy ([name]) - 4 L/min   Atrial fibrillation ([place])   History of CVA (cerebrovascular accident)   PVD (peripheral vascular disease) ([place])   Left leg cellulitis    Discharge Instructions  Discharge Instructions     Ambulatory referral to Podiatry   Complete by: As directed    Diet - low sodium heart healthy   Complete by: As directed    Increase activity slowly   Complete by: As directed       Allergies as of 08/09/2022   No Known Allergies      Medication List     STOP taking these medications    multivitamin with minerals Tabs tablet   polyethylene glycol powder 17 GM/SCOOP powder Commonly known as: GLYCOLAX/MIRALAX       TAKE these medications    apixaban 2.5 MG Tabs tablet Commonly known as: ELIQUIS Take 1 tablet (2.5 mg total) by mouth 2 (two) times daily.   divalproex 125 MG capsule Commonly known as: DEPAKOTE SPRINKLE Take 1 capsule (125 mg total) by mouth 2 (two) times daily.   docusate sodium 100 MG capsule Commonly known as: COLACE Take 1 capsule (100 mg total) by mouth daily.   doxycycline 100 MG capsule Commonly known as: VIBRAMYCIN Take 1 capsule (100 mg total) by mouth 2 (two) times daily for 7 days.   finasteride 5 MG tablet Commonly known as: PROSCAR TAKE 1 TABLET (5 MG TOTAL) BY MOUTH DAILY.   furosemide 40 MG tablet Commonly known as: LASIX  Take 1 tablet (40 mg total) by mouth daily.   Iron (Ferrous Sulfate) 325 (65 Fe) MG Tabs Take 1 tablet every other day. What changed:  how much to take how to take this when to take this additional instructions   latanoprost 0.005 % ophthalmic solution Commonly known as: XALATAN Place 1 drop into both eyes at bedtime.   loratadine 10 MG tablet Commonly known as: CLARITIN Take 10 mg by mouth daily as needed for rhinitis.    Melatonin 3 MG Tbdp Take 1 tablet by mouth at bedtime.   memantine 10 MG tablet Commonly known as: NAMENDA Take 10 mg by mouth 2 (two) times daily.   midodrine 2.5 MG tablet Commonly known as: PROAMATINE Take 1 tablet (2.5 mg total) by mouth 3 (three) times daily with meals.   pantoprazole 40 MG tablet Commonly known as: PROTONIX TAKE 1 TABLET(40 MG) BY MOUTH DAILY What changed:  how much to take how to take this when to take this additional instructions   pioglitazone 15 MG tablet Commonly known as: ACTOS Take 15 mg by mouth daily.   tamsulosin 0.4 MG Caps capsule Commonly known as: FLOMAX Take 1 capsule (0.4 mg total) by mouth daily.        Follow-up Information     [name], MD Follow up.   Specialty: Family Medicine Why: Please schedule an appointment with the clinic in the next 7-10 days. Contact information: [place] [address] [phone]         [facility] Follow up.   Why: [facility] will continue to provide services for RN, PT, OT.  They will call you in the next 24-48 hours to restart services. Contact information: [address] [place] [address] [phone]                No Known Allergies  Consultations: None   Procedures/Studies: DG Ankle Complete Left  Result Date: 08/06/2022 CLINICAL DATA:  Trauma, pain and swelling EXAM: LEFT ANKLE COMPLETE - 3+ VIEW COMPARISON:  None Available. FINDINGS: No displaced fracture or dislocation is seen. There is soft tissue swelling around the ankle. Plantar spur is seen in calcaneus. Small linear calcification is seen at the attachment of Achilles tendon to the calcaneus suggesting tendinosis. IMPRESSION: No recent displaced fracture or dislocation is seen in left ankle. Electronically Signed   By: Elmer Picker M.D.   On: 08/06/2022 15:21   DG Foot Complete Left  Result Date: 08/06/2022 CLINICAL DATA:   Trauma, pain EXAM: LEFT FOOT - COMPLETE 3+ VIEW COMPARISON:  None Available. FINDINGS: No recent fracture or dislocation is seen. There is marked soft tissue swelling over the dorsum. Plantar spur is seen in calcaneus. Small calcification is seen at the attachment of Achilles tendon to the calcaneus. IMPRESSION: No recent displaced fracture or dislocation is seen in left foot. Electronically Signed   By: Elmer Picker M.D.   On: 08/06/2022 15:20   IR THORACENTESIS ASP PLEURAL SPACE W/IMG GUIDE  Result Date: 07/25/2022 INDICATION: Diastolic heart failure with recurrent right pleural effusion. Request for diagnostic and therapeutic thoracentesis. EXAM: ULTRASOUND GUIDED RIGHT THORACENTESIS MEDICATIONS: 1% lidocaine 10 mL COMPLICATIONS: None immediate. PROCEDURE: An ultrasound guided thoracentesis was thoroughly discussed with the patient and questions answered. The benefits, risks, alternatives and complications were also discussed. The patient understands and wishes to proceed with the procedure. Written consent was obtained. Ultrasound was performed to localize and mark an adequate pocket  of fluid in the right chest. The area was then prepped and draped in the normal sterile fashion. 1% Lidocaine was used for local anesthesia. Under ultrasound guidance a 6 Fr Safe-T-Centesis catheter was introduced. Thoracentesis was performed. The catheter was removed and a dressing applied. FINDINGS: A total of approximately 850 mL of clear yellow fluid was removed. Samples were sent to the laboratory as requested by the clinical team. IMPRESSION: Successful ultrasound guided right thoracentesis yielding 850 mL of pleural fluid. No pneumothorax on post-procedure chest x-ray. Procedure performed by: Gareth Eagle, PA-C Electronically Signed   By: Ruthann Cancer M.D.   On: 07/25/2022 13:47   DG Chest 1 View  Result Date: 07/25/2022 CLINICAL DATA:  Status post right thoracentesis EXAM: CHEST  1 VIEW COMPARISON:  Previous  studies including the examination of 07/24/2022 FINDINGS: Transverse diameter of heart is increased. There is significant interval decrease in right pleural effusion after thoracentesis. Right lateral CP angle is clear. There is no pneumothorax. There are linear densities in both lower lung fields. There is blunting of left lateral CP angle. Pacemaker battery is seen in left infraclavicular region. IMPRESSION: There is significant interval decrease in right pleural effusion. There is no pneumothorax. Small patchy infiltrates in both lower lung fields may suggest atelectasis. Small left pleural effusion. Electronically Signed   By: Elmer Picker M.D.   On: 07/25/2022 13:26   IR THORACENTESIS ASP PLEURAL SPACE W/IMG GUIDE  Result Date: 07/24/2022 INDICATION: Patient with history of recurrent left pleural effusion. Request for repeat left thoracentesis. EXAM: ULTRASOUND GUIDED LEFT THORACENTESIS MEDICATIONS: 10 mL 1% lidocaine COMPLICATIONS: None immediate. PROCEDURE: An ultrasound guided thoracentesis was thoroughly discussed with the patient and questions answered. The benefits, risks, alternatives and complications were also discussed. The patient understands and wishes to proceed with the procedure. Written consent was obtained. Ultrasound was performed to localize and mark an adequate pocket of fluid in the left chest. The area was then prepped and draped in the normal sterile fashion. 1% Lidocaine was used for local anesthesia. Under ultrasound guidance a 6 Fr Safe-T-Centesis catheter was introduced. Thoracentesis was performed. The catheter was removed and a dressing applied. FINDINGS: A total of approximately 600 mL of yellow fluid was removed. Samples were sent to the laboratory as requested by the clinical team. IMPRESSION: Successful ultrasound guided left thoracentesis yielding 600 mL of pleural fluid. Read by: Brynda Greathouse PA-C Electronically Signed   By: Ruthann Cancer M.D.   On: 07/24/2022  16:26   DG Chest Port 1 View  Result Date: 07/24/2022 CLINICAL DATA:  Pleural effusion. EXAM: PORTABLE CHEST 1 VIEW COMPARISON:  July 23, 2022. FINDINGS: Enlarged cardiac silhouette. Left subclavian approach cardiac rhythm maintenance device. Layering bilateral pleural effusions. No visible pneumothorax. Polyarticular degenerative change. IMPRESSION: Layering bilateral pleural effusions without visible pneumothorax. Electronically Signed   By: Margaretha Sheffield M.D.   On: 07/24/2022 16:20   IR THORACENTESIS ASP PLEURAL SPACE W/IMG GUIDE  Result Date: 07/23/2022 INDICATION: Shortness of breath. Left-sided pleural effusion. Request for therapeutic thoracentesis. EXAM: ULTRASOUND GUIDED LEFT THORACENTESIS MEDICATIONS: 1% plain lidocaine, 5 mL COMPLICATIONS: None immediate. PROCEDURE: An ultrasound guided thoracentesis was thoroughly discussed with the patient and questions answered. The benefits, risks, alternatives and complications were also discussed. The patient understands and wishes to proceed with the procedure. Written consent was obtained. Ultrasound was performed to localize and mark an adequate pocket of fluid in the left chest. The area was then prepped and draped in the normal sterile fashion. 1% Lidocaine  was used for local anesthesia. Under ultrasound guidance a 6 Fr Safe-T-Centesis catheter was introduced. Thoracentesis was performed. The catheter was removed and a dressing applied. FINDINGS: A total of approximately 1.2 L of clear yellow fluid was removed. IMPRESSION: Successful ultrasound guided left thoracentesis yielding 1.2 L of pleural fluid. Read by: Ascencion Dike PA-C Electronically Signed   By: Ruthann Cancer M.D.   On: 07/23/2022 12:44   DG Chest 1 View  Result Date: 07/23/2022 CLINICAL DATA:  Left-sided thoracentesis. EXAM: CHEST  1 VIEW COMPARISON:  Chest x-ray July 22, 2022. FINDINGS: Decreased layering left pleural effusion and similar right pleural effusion. No visible  pneumothorax on this semi erect radiograph. Similar overlying bibasilar opacities. Similar enlarged cardiac silhouette. Similar position of left subclavian approach dual lead cardiac rhythm maintenance device. No acute osseous abnormality. IMPRESSION: 1. Decreased layering left pleural effusion and similar right pleural effusion. No visible pneumothorax on this semi erect radiograph. 2. Similar overlying bibasilar opacities, potentially atelectasis. Electronically Signed   By: Margaretha Sheffield M.D.   On: 07/23/2022 12:22   DG Chest 2 View  Result Date: 07/22/2022 CLINICAL DATA:  Dyspnea. EXAM: CHEST - 2 VIEW COMPARISON:  July 09, 2022. FINDINGS: Stable cardiomegaly. Left-sided pacemaker is unchanged in position. Bilateral pleural effusions are noted, left greater than right, with associated bibasilar atelectasis or edema. Bony thorax is unremarkable. IMPRESSION: Bilateral pleural effusions are noted, left greater than right, with associated bibasilar atelectasis or edema. Electronically Signed   By: Marijo Conception M.D.   On: 07/22/2022 16:08   (Echo, Carotid, EGD, Colonoscopy, ERCP)    Subjective: Patient seen and examined.  Denies any complaints.  He wants to get out of the bed to the chair.  Minimal pain left great toe.  Eager to go home.   Discharge Exam: Vitals:   08/09/22 0505 08/09/22 0745  BP: 107/86 100/63  Pulse: 97 92  Resp: 16 18  Temp: 98.4 F (36.9 C) 98.2 F (36.8 C)  SpO2: 99% 100%   Vitals:   08/08/22 1535 08/08/22 1955 08/09/22 0505 08/09/22 0745  BP: 91/61 122/77 107/86 100/63  Pulse: 64 (!) 59 97 92  Resp: 18 16 16 18   Temp: 98.5 F (36.9 C) 98 F (36.7 C) 98.4 F (36.9 C) 98.2 F (36.8 C)  TempSrc: Oral   Oral  SpO2: 92% 94% 99% 100%  Weight:      Height:        General: Pt is alert, awake, not in acute distress Appropriate for his age.  Alert awake and oriented.  Hard of hearing. Cardiovascular: RRR, S1/S2 +, no rubs, no gallops Respiratory: CTA  bilaterally, no wheezing, no rhonchi Abdominal: Soft, NT, ND, bowel sounds + Extremities: no cyanosis  Small hematoma at the tip of the left great toe and medial aspect of the second toe.  Minimal nontender swelling around the dorsum of the foot.  No open wound.  Nontender.  Picture in the chart.     The results of significant diagnostics from this hospitalization (including imaging, microbiology, ancillary and laboratory) are listed below for reference.     Microbiology: Recent Results (from the past 240 hour(s))  Blood culture (routine x 2)     Status: None (Preliminary result)   Collection Time: 08/07/22  8:43 PM   Specimen: BLOOD  Result Value Ref Range Status   Specimen Description BLOOD RIGHT ANTECUBITAL  Final   Special Requests   Final    BOTTLES DRAWN AEROBIC AND ANAEROBIC Blood  Culture results may not be optimal due to an inadequate volume of blood received in culture bottles   Culture   Final    NO GROWTH 2 DAYS Performed at [facility], [place] [address], [place], [place] [address]    Report Status PENDING  Incomplete  Blood culture (routine x 2)     Status: None (Preliminary result)   Collection Time: 08/07/22  8:55 PM   Specimen: BLOOD  Result Value Ref Range Status   Specimen Description BLOOD LEFT ANTECUBITAL  Final   Special Requests   Final    BOTTLES DRAWN AEROBIC AND ANAEROBIC Blood Culture results may not be optimal due to an inadequate volume of blood received in culture bottles   Culture   Final    NO GROWTH 2 DAYS Performed at [facility], [place] [address], [place], [place] [address]    Report Status PENDING  Incomplete     Labs: BNP (last 3 results) Recent Labs    07/06/22 1230 07/22/22 1557 07/25/22 0627  BNP 365.0* 326.5* [id number]*   Basic Metabolic Panel: Recent Labs  Lab 08/07/22 2048  NA 139  K 4.4  CL 105  CO2 23  GLUCOSE 149*  BUN 34*  CREATININE 2.20*  CALCIUM 7.7*   Liver Function Tests: Recent Labs  Lab  08/07/22 2048  AST 16  ALT 10  ALKPHOS 62  BILITOT 0.7  PROT 5.9*  ALBUMIN 2.9*   No results for input(s): "LIPASE", "AMYLASE" in the last 168 hours. No results for input(s): "AMMONIA" in the last 168 hours. CBC: Recent Labs  Lab 08/07/22 2048  WBC 8.5  NEUTROABS 6.7  HGB 9.3*  HCT 28.3*  MCV 76.9*  PLT 176   Cardiac Enzymes: No results for input(s): "CKTOTAL", "CKMB", "CKMBINDEX", "TROPONINI" in the last 168 hours. BNP: Invalid input(s): "POCBNP" CBG: Recent Labs  Lab 08/08/22 1200 08/08/22 1623 08/08/22 2005 08/09/22 0225 08/09/22 0747  GLUCAP 129* 86 62* 120* 133*   D-Dimer No results for input(s): "DDIMER" in the last 72 hours. Hgb A1c No results for input(s): "HGBA1C" in the last 72 hours. Lipid Profile No results for input(s): "CHOL", "HDL", "LDLCALC", "TRIG", "CHOLHDL", "LDLDIRECT" in the last 72 hours. Thyroid function studies No results for input(s): "TSH", "T4TOTAL", "T3FREE", "THYROIDAB" in the last 72 hours.  Invalid input(s): "FREET3" Anemia work up No results for input(s): "VITAMINB12", "FOLATE", "FERRITIN", "TIBC", "IRON", "RETICCTPCT" in the last 72 hours. Urinalysis    Component Value Date/Time   COLORURINE YELLOW 07/22/2022 1708   APPEARANCEUR CLEAR 07/22/2022 1708   LABSPEC 1.011 07/22/2022 1708   PHURINE 5.0 07/22/2022 1708   GLUCOSEU NEGATIVE 07/22/2022 1708   GLUCOSEU NEGATIVE 10/30/2020 1051   HGBUR NEGATIVE 07/22/2022 1708   BILIRUBINUR NEGATIVE 07/22/2022 1708   BILIRUBINUR neg 08/03/2021 1702   KETONESUR NEGATIVE 07/22/2022 1708   PROTEINUR NEGATIVE 07/22/2022 1708   UROBILINOGEN 1.0 08/03/2021 1702   UROBILINOGEN 0.2 10/30/2020 1051   NITRITE NEGATIVE 07/22/2022 1708   LEUKOCYTESUR NEGATIVE 07/22/2022 1708   Sepsis Labs Recent Labs  Lab 08/07/22 2048  WBC 8.5   Microbiology Recent Results (from the past 240 hour(s))  Blood culture (routine x 2)     Status: None (Preliminary result)   Collection Time: 08/07/22  8:43  PM   Specimen: BLOOD  Result Value Ref Range Status   Specimen Description BLOOD RIGHT ANTECUBITAL  Final   Special Requests   Final    BOTTLES DRAWN AEROBIC AND ANAEROBIC Blood Culture results  may not be optimal due to an inadequate volume of blood received in culture bottles   Culture   Final    NO GROWTH 2 DAYS Performed at [facility], [place] [address] [place], [place] [address]    Report Status PENDING  Incomplete  Blood culture (routine x 2)     Status: None (Preliminary result)   Collection Time: 08/07/22  8:55 PM   Specimen: BLOOD  Result Value Ref Range Status   Specimen Description BLOOD LEFT ANTECUBITAL  Final   Special Requests   Final    BOTTLES DRAWN AEROBIC AND ANAEROBIC Blood Culture results may not be optimal due to an inadequate volume of blood received in culture bottles   Culture   Final    NO GROWTH 2 DAYS Performed at [facility], [place] [address], [place], [place] [address]    Report Status PENDING  Incomplete     Time coordinating discharge: 32 minutes  SIGNED:   Barb Merino, MD  [facility] 08/09/2022, 10:04 AM

## 2022-08-09 NOTE — Care Plan (Signed)
Patient [name], very hard of hearing. Lungs clear, no SOB. Irregular HR. Patient with +1 BLE edema and skin discoloration on lower legs. Denies pain or any other discomfort, wants help to sit in chair. Ate 60% of breakfast. Care plan discussed with patient and educated regarding Fall risk and morning meds.Call light and personal belongings within reach

## 2022-08-11 ENCOUNTER — Other Ambulatory Visit: Payer: Self-pay | Admitting: Family Medicine

## 2022-08-11 DIAGNOSIS — N1832 Chronic kidney disease, stage 3b: Secondary | ICD-10-CM

## 2022-08-12 LAB — CULTURE, BLOOD (ROUTINE X 2)
Culture: NO GROWTH
Culture: NO GROWTH

## 2022-08-13 NOTE — Progress Notes (Signed)
  Chronic Care Management   Note  08/13/2022 Name: [name] MRN: [id number] DOB: [birth year]  [name] is a 89 y.o. year old male who is a primary care patient of [name], MD. I reached out to Alferd Apa by phone today in response to a referral sent by [name]'s PCP.  [name] was given information about Chronic Care Management services today including:  CCM service includes personalized support from designated clinical staff supervised by the physician, including individualized plan of care and coordination with other care providers 24/7 contact phone numbers for assistance for urgent and routine care needs. Service will only be billed when office clinical staff spend 20 minutes or more in a month to coordinate care. Only one practitioner may furnish and bill the service in a calendar month. The patient may stop CCM services at amy time (effective at the end of the month) by phone call to the office staff. The patient will be responsible for cost sharing (co-pay) or up to 20% of the service fee (after annual deductible is met)  [name]  agreedto scheduling an appointment with the CCM RN Case Manager   Follow up plan: Patient agreed to scheduled appointment with RN Case Manager on 08/19/2022 and LCSW 08/20/2022(date/time).   Noreene Larsson, [place], [place] [address] Direct Dial: [phone] [email or web address]

## 2022-08-15 ENCOUNTER — Ambulatory Visit (INDEPENDENT_AMBULATORY_CARE_PROVIDER_SITE_OTHER): Payer: PPO | Admitting: Podiatry

## 2022-08-15 DIAGNOSIS — Z741 Need for assistance with personal care: Secondary | ICD-10-CM | POA: Insufficient documentation

## 2022-08-15 DIAGNOSIS — H918X3 Other specified hearing loss, bilateral: Secondary | ICD-10-CM | POA: Insufficient documentation

## 2022-08-15 DIAGNOSIS — T148XXA Other injury of unspecified body region, initial encounter: Secondary | ICD-10-CM

## 2022-08-15 DIAGNOSIS — Z0279 Encounter for issue of other medical certificate: Secondary | ICD-10-CM | POA: Insufficient documentation

## 2022-08-15 DIAGNOSIS — M25569 Pain in unspecified knee: Secondary | ICD-10-CM | POA: Insufficient documentation

## 2022-08-15 DIAGNOSIS — Z659 Problem related to unspecified psychosocial circumstances: Secondary | ICD-10-CM | POA: Insufficient documentation

## 2022-08-15 DIAGNOSIS — I2510 Atherosclerotic heart disease of native coronary artery without angina pectoris: Secondary | ICD-10-CM | POA: Insufficient documentation

## 2022-08-15 DIAGNOSIS — Z0289 Encounter for other administrative examinations: Secondary | ICD-10-CM | POA: Insufficient documentation

## 2022-08-15 DIAGNOSIS — M6281 Muscle weakness (generalized): Secondary | ICD-10-CM | POA: Insufficient documentation

## 2022-08-15 DIAGNOSIS — E11621 Type 2 diabetes mellitus with foot ulcer: Secondary | ICD-10-CM

## 2022-08-15 DIAGNOSIS — R238 Other skin changes: Secondary | ICD-10-CM

## 2022-08-15 DIAGNOSIS — Z461 Encounter for fitting and adjustment of hearing aid: Secondary | ICD-10-CM | POA: Insufficient documentation

## 2022-08-15 DIAGNOSIS — H9190 Unspecified hearing loss, unspecified ear: Secondary | ICD-10-CM | POA: Insufficient documentation

## 2022-08-15 DIAGNOSIS — I110 Hypertensive heart disease with heart failure: Secondary | ICD-10-CM | POA: Insufficient documentation

## 2022-08-15 NOTE — Patient Instructions (Signed)
Try to leave the toe open to air, apply betadine liquid to the toe daily

## 2022-08-18 NOTE — Progress Notes (Signed)
  Subjective:  Patient ID: [name], male    DOB: [birth year],  MRN: [id number]  Chief Complaint  Patient presents with   cellulitis    (np) Cellulitis and abscess of toe of left foot    89 y.o. male presents with the above complaint. History confirmed with patient. He presents for evaluation of a new bloody blister that developed he and his family are not sure how this started, he is on eliquis  Objective:  Physical Exam: warm, good capillary refill, normal DP and PT pulses, normal sensory exam, and hemorrhagic bulla plantar right great toe. No signs of infection deep to this  Assessment:   1. Diabetic ulcer of right great toe ([place])   2. Blood blister      Plan:  Patient was evaluated and treated and all questions answered.  Bulla was lanced and evacuated/drained with sterile instrumentation after prep with betadine. Further betadine applied. Non adherent and gauze/coban applied. Can leave OTA at home w/ betadine liquid. Culture of the drainage was taken. F/U in 3 weeks  Return in about 3 weeks (around 09/05/2022) for wound care.

## 2022-08-19 ENCOUNTER — Ambulatory Visit (INDEPENDENT_AMBULATORY_CARE_PROVIDER_SITE_OTHER): Payer: PPO

## 2022-08-19 ENCOUNTER — Telehealth: Payer: No Typology Code available for payment source

## 2022-08-19 DIAGNOSIS — F03911 Unspecified dementia, unspecified severity, with agitation: Secondary | ICD-10-CM

## 2022-08-19 DIAGNOSIS — I5032 Chronic diastolic (congestive) heart failure: Secondary | ICD-10-CM

## 2022-08-19 NOTE — Patient Instructions (Addendum)
Please call the care guide team at [phone] if you need to cancel or reschedule your appointment.   If you are experiencing a Mental Health or [place] or need someone to talk to, please call the Suicide and Crisis Lifeline: 988 call the [place] National Suicide Prevention Lifeline: (614)247-4545 or TTY: 316-139-1077 TTY (239)689-2401) to talk to a trained counselor call 1-800-273-TALK (toll free, 24 hour hotline) go to [facility] [phone])   Following is a copy of the CCM Program Consent:  CCM service includes personalized support from designated clinical staff supervised by the physician, including individualized plan of care and coordination with other care providers 24/7 contact phone numbers for assistance for urgent and routine care needs. Service will only be billed when office clinical staff spend 20 minutes or more in a month to coordinate care. Only one practitioner may furnish and bill the service in a calendar month. The patient may stop CCM services at amy time (effective at the end of the month) by phone call to the office staff. The patient will be responsible for cost sharing (co-pay) or up to 20% of the service fee (after annual deductible is met)  Following is a copy of your full provider care plan:   Goals Addressed             This Visit's Progress    CCM Expected Outcome:  Monitor, Self-Manage and Reduce Symptoms of Heart Failure       Current Barriers:  Care Coordination needs related to resources and social work support  in a patient with heart failure and other chronic conditions Chronic Disease Management support and education needs related to effective management of HF Caregiver support   Planned Interventions: Basic overview and discussion of pathophysiology of Heart Failure reviewed Provided education on low sodium diet. The patient is getting meals on wheels but often  does not like what they bring. His granddaughter cooks for him but sometimes he will not eat. Does like fast food. Education on monitoring for salt and hidden sodium in his diet.  Reviewed Heart Failure Action Plan in depth and provided written copy Advised patient to weigh each morning after emptying bladder Reviewed role of diuretics in prevention of fluid overload and management of heart failure Discussed the importance of keeping all appointments with provider Provided patient with education about the role of exercise in the management of heart failure Advised patient to discuss changes in fluid balance, heart failure, questions, or concerns with provider Screening for signs and symptoms of depression related to chronic disease state  Assessed social determinant of health barriers  Symptom Management: Take medications as prescribed   Attend all scheduled provider appointments Call provider office for new concerns or questions  call the Suicide and Crisis Lifeline: 988 call the [place] National Suicide Prevention Lifeline: 603-703-5606 or TTY: (661) 322-3242 TTY 204-689-4537) to talk to a trained counselor call 1-800-273-TALK (toll free, 24 hour hotline) go to [facility] [address], [place] [phone]) if experiencing a [place] or [place]  call office if I gain more than 2 pounds in one day or 5 pounds in one week use salt in moderation watch for swelling in feet, ankles and legs every day develop a rescue plan follow rescue plan if symptoms flare-up track symptoms and what helps feel better or worse dress right for the weather, hot or cold  Follow Up Plan: Telephone follow up appointment  with care management team member scheduled for: 09-30-2022 at 345 pm       CCM Expected Outcome:  Monitor, Self-Manage and Reduce Symptoms [id number]       Current Barriers:  Knowledge Deficits related to understanding changes in  the patients dementia and decline with memory Care Coordination needs related to resources to help with managing changes in mentation and mental status  in a patient with dementia  Chronic Disease Management support and education needs related to effective management of Dementia Caregiver support for caring for elderly grandfather with dementia and behavioral changes  Planned Interventions: Evaluation of current treatment plan related to dementia  and patient's adherence to plan as established by provider Advised patient to call the office for changes in mood, increased anxiety, depression, or other sx and sx of acute changes in mentation  Provided education to patient re: The book, "The 65 Hour Day", a resource to help patients and family members understand the progression of dementia and how to cope with changes seen. Reviewed medications with patient and discussed compliance. The patients granddaughter states that the patient has been known to take his medications and put them in his pocket. She says he can be sneaky. The granddaughter has spoke to the caregivers and ask the caregivers to make sure he is taking his medications. Provided patient with dementia  educational materials related to changes seen in patients with dementia and resources to help with coping with changes seen and the progression of the disease process. Reviewed scheduled/upcoming provider appointments including 09-11-2022 at 1040 am Social Work referral for support and education for dementia and changes being seen. Upcoming appointment on 08-20-2022 Discussed plans with patient for ongoing care management follow up and provided patient with direct contact information for care management team Advised patient to discuss changes she is seeing with the patient, recommendations and support with provider Screening for signs and symptoms of depression related to chronic disease state  Assessed social determinant of health  barriers  Symptom Management: Take medications as prescribed   Attend all scheduled provider appointments Call provider office for new concerns or questions  call the Suicide and Crisis Lifeline: 988 call the [place] National Suicide Prevention Lifeline: (413)817-8769 or TTY: 510-365-7778 TTY 231-267-6341) to talk to a trained counselor call 1-800-273-TALK (toll free, 24 hour hotline) go to [facility] [address], [place] [phone]) if experiencing a Mental Health or [place]   Follow Up Plan: Telephone follow up appointment with care management team member scheduled for: 09-30-2022 at 345 pm          Patient verbalizes understanding of instructions and care plan provided today and agrees to view in [place]. Active MyChart status and patient understanding of how to access instructions and care plan via MyChart confirmed with patient.     Telephone follow up appointment with care management team member scheduled for: 09-30-2022 at 345 pm  Heart-Healthy Eating Plan Many factors influence your heart health, including eating and exercise habits. Heart health is also called coronary health. Coronary risk increases with abnormal blood fat (lipid) levels. A heart-healthy eating plan includes limiting unhealthy fats, increasing healthy fats, limiting salt (sodium) intake, and making other diet and lifestyle changes. What is my plan? Your health care provider may recommend that: You limit your fat intake to _________% or less of your total calories each day. You limit your saturated fat intake to _________% or less of your total calories each day. You limit the  amount of cholesterol in your diet to less than _________ mg per day. You limit the amount of sodium in your diet to less than _________ mg per day. What are tips for following this plan? Cooking Cook foods using methods other than frying. Baking, boiling, grilling, and  broiling are all good options. Other ways to reduce fat include: Removing the skin from poultry. Removing all visible fats from meats. Steaming vegetables in water or broth. Meal planning  At meals, imagine dividing your plate into fourths: Fill one-half of your plate with vegetables and green salads. Fill one-fourth of your plate with whole grains. Fill one-fourth of your plate with lean protein foods. Eat 2-4 cups of vegetables per day. One cup of vegetables equals 1 cup (91 g) broccoli or cauliflower florets, 2 medium carrots, 1 large bell pepper, 1 large sweet potato, 1 large tomato, 1 medium white potato, 2 cups (150 g) raw leafy greens. Eat 1-2 cups of fruit per day. One cup of fruit equals 1 small apple, 1 large banana, 1 cup (237 g) mixed fruit, 1 large orange,  cup (82 g) dried fruit, 1 cup (240 mL) 100% fruit juice. Eat more foods that contain soluble fiber. Examples include apples, broccoli, carrots, beans, peas, and barley. Aim to get 25-30 g of fiber per day. Increase your consumption of legumes, nuts, and seeds to 4-5 servings per week. One serving of dried beans or legumes equals  cup (90 g) cooked, 1 serving of nuts is  oz (12 almonds, 24 pistachios, or 7 walnut halves), and 1 serving of seeds equals  oz (8 g). Fats Choose healthy fats more often. Choose monounsaturated and polyunsaturated fats, such as olive and canola oils, avocado oil, flaxseeds, walnuts, almonds, and seeds. Eat more omega-3 fats. Choose salmon, mackerel, sardines, tuna, flaxseed oil, and ground flaxseeds. Aim to eat fish at least 2 times each week. Check food labels carefully to identify foods with trans fats or high amounts of saturated fat. Limit saturated fats. These are found in animal products, such as meats, butter, and cream. Plant sources of saturated fats include palm oil, palm kernel oil, and coconut oil. Avoid foods with partially hydrogenated oils in them. These contain trans fats. Examples  are stick margarine, some tub margarines, cookies, crackers, and other baked goods. Avoid fried foods. General information Eat more home-cooked food and less restaurant, buffet, and fast food. Limit or avoid alcohol. Limit foods that are high in added sugar and simple starches such as foods made using white refined flour (white breads, pastries, sweets). Lose weight if you are overweight. Losing just 5-10% of your body weight can help your overall health and prevent diseases such as diabetes and heart disease. Monitor your sodium intake, especially if you have high blood pressure. Talk with your health care provider about your sodium intake. Try to incorporate more vegetarian meals weekly. What foods should I eat? Fruits All fresh, canned (in natural juice), or frozen fruits. Vegetables Fresh or frozen vegetables (raw, steamed, roasted, or grilled). Green salads. Grains Most grains. Choose whole wheat and whole grains most of the time. Rice and pasta, including brown rice and pastas made with whole wheat. Meats and other proteins Lean, well-trimmed beef, veal, pork, and lamb. Chicken and [place] without skin. All fish and shellfish. Wild duck, rabbit, pheasant, and venison. Egg whites or low-cholesterol egg substitutes. Dried beans, peas, lentils, and tofu. Seeds and most nuts. Dairy Low-fat or nonfat cheeses, including ricotta and mozzarella. Skim or 1% milk (  liquid, powdered, or evaporated). Buttermilk made with low-fat milk. Nonfat or low-fat yogurt. Fats and oils Non-hydrogenated (trans-free) margarines. Vegetable oils, including soybean, sesame, sunflower, olive, avocado, peanut, safflower, corn, canola, and cottonseed. Salad dressings or mayonnaise made with a vegetable oil. Beverages Water (mineral or sparkling). Coffee and tea. Unsweetened ice tea. Diet beverages. Sweets and desserts Sherbet, gelatin, and fruit ice. Small amounts of dark chocolate. Limit all sweets and  desserts. Seasonings and condiments All seasonings and condiments. The items listed above may not be a complete list of foods and beverages you can eat. Contact a dietitian for more options. What foods should I avoid? Fruits Canned fruit in heavy syrup. Fruit in cream or butter sauce. Fried fruit. Limit coconut. Vegetables Vegetables cooked in cheese, cream, or butter sauce. Fried vegetables. Grains Breads made with saturated or trans fats, oils, or whole milk. Croissants. Sweet rolls. Donuts. High-fat crackers, such as cheese crackers and chips. Meats and other proteins Fatty meats, such as hot dogs, ribs, sausage, bacon, rib-eye roast or steak. High-fat deli meats, such as salami and bologna. Caviar. Domestic duck and goose. Organ meats, such as liver. Dairy Cream, sour cream, cream cheese, and creamed cottage cheese. Whole-milk cheeses. Whole or 2% milk (liquid, evaporated, or condensed). Whole buttermilk. Cream sauce or high-fat cheese sauce. Whole-milk yogurt. Fats and oils Meat fat, or shortening. Cocoa butter, hydrogenated oils, palm oil, coconut oil, palm kernel oil. Solid fats and shortenings, including bacon fat, salt pork, lard, and butter. Nondairy cream substitutes. Salad dressings with cheese or sour cream. Beverages Regular sodas and any drinks with added sugar. Sweets and desserts Frosting. Pudding. Cookies. Cakes. Pies. Milk chocolate or white chocolate. Buttered syrups. Full-fat ice cream or ice cream drinks. The items listed above may not be a complete list of foods and beverages to avoid. Contact a dietitian for more information. Summary Heart-healthy meal planning includes limiting unhealthy fats, increasing healthy fats, limiting salt (sodium) intake and making other diet and lifestyle changes. Lose weight if you are overweight. Losing just 5-10% of your body weight can help your overall health and prevent diseases such as diabetes and heart disease. Focus on eating a  balance of foods, including fruits and vegetables, low-fat or nonfat dairy, lean protein, nuts and legumes, whole grains, and heart-healthy oils and fats. This information is not intended to replace advice given to you by your health care provider. Make sure you discuss any questions you have with your health care provider. Document Revised: 06/11/2021 Document Reviewed: 06/11/2021 Elsevier Patient Education  [place]. Confusion Confusion is the inability to think with your usual speed or clarity. Confusion can be caused by many things. People who are confused often describe their thinking as cloudy or unclear. Confusion can also include feeling disoriented. This means you are unaware of where you are or who you are. You may also not know the date or time. When confused, you may have trouble remembering, paying attention, or making decisions. Some people also act aggressively when they are confused. In some cases, confusion may come on quickly. In other cases, it may develop slowly over time. Confusion may be caused by medical conditions such as: Infections, such as a urinary tract infection (UTI). Low levels of oxygen, which can develop from conditions such as long-term lung disorders. Decrease in brain function due to dementia and other conditions that affect the brain, such as seizures, strokes, brain tumors, or head injuries. Mental health conditions, like panic attacks, anxiety, depression, and hallucinations. Confusion  may also be caused by physical factors such as: Loss of fluid (dehydration) or an imbalance of salts and minerals in the body (electrolytes). Lack of certain nutrients like niacin, thiamine, or other B vitamins. Fever or hypothermia, which is a sudden drop in body temperature. Low or high blood sugar. Low or high blood pressure. Other causes include: Lack of sleep or changes in routine or surroundings, such as when traveling or staying in a hospital. Using too much  alcohol, drugs, or medicine. Side effects of medicines, or taking medicines that affect other medicines (drug interactions). Follow these instructions at home: Pay attention to your symptoms. Tell your health care provider about any changes or if you develop new symptoms. Follow these instructions to control or treat symptoms. Ask a family member or friend for help if needed. Medicines  Take over-the-counter and prescription medicines only as told by your health care provider. Ask your health care provider about changing or stopping any medicines that may be causing your confusion. Avoid pain medicines or sleep medicines until you have fully recovered. Use a pillbox or an alarm to help you take the right medicines at the right time. Lifestyle  Eat a balanced diet that includes fruits and vegetables. Get enough sleep. For most adults, this is 7-9 hours each night. Do not drink alcohol. Do not become isolated. Spend time with other people and make plans for your days. Do not drive until your health care provider says that it is safe to do so. Do not use any products that contain nicotine or tobacco, such as cigarettes, e-cigarettes, and chewing tobacco. If you need help quitting, ask your health care provider. Stop other activities that may increase your chances of getting hurt. These may include some work duties, sports activities, swimming, or bike riding. Ask your health care provider what activities are safe for you. Tips for caregivers Find out if the person is confused. Ask the person to state his or her name, age, and the date. If the person is unsure or answers incorrectly, he or she may be confused and need assistance. Always introduce yourself, no matter how well the person knows you. Remind the person of his or her location. Place a calendar and clock near the person who is confused. Keep a regular schedule. Make sure the person has plenty of light during the day and sleep at  night. Talk about current events and plans for the day. Keep the environment calm, quiet, and peaceful. Help the person do the things that he or she is unable to do. These include: Taking medicines. Keeping medical appointments. Helping with household duties, including meal preparation. Running errands. Get help if you need it. There are several support groups for caregivers. If the person you are helping needs more support, consider day care, extended-care programs, or a skilled nursing facility. The person's health care provider may be able to help evaluate these options. General instructions Monitor yourself for any conditions you may have. These can include: Checking your blood glucose levels if you have diabetes. Maintaining a healthy weight. Monitoring your blood pressure if you have hypertension. Monitoring your body temperature if you have a fever. Keep all follow-up visits. This is important. Contact a health care provider if: You have new symptoms or your symptoms get worse. Get help right away if you: Feel that you are not able to care for yourself. Develop severe headaches, repeated vomiting, seizures, blackouts, or slurred speech. Have increasing confusion, weakness, numbness, restlessness, or personality changes.  Develop a loss of balance, have marked dizziness, feel uncoordinated, or fall. Develop severe anxiety, or you have delusions or hallucinations. These symptoms may represent a serious problem that is an emergency. Do not wait to see if the symptoms will go away. Get medical help right away. Call your local emergency services (911 in the [place] Do not drive yourself to the hospital. Summary Confusion is the inability to think with your usual speed or clarity. People who are confused often describe their thinking as cloudy or unclear. Confusion can also include having trouble remembering, paying attention, or making decisions. Confusion may come on quickly or develop  slowly over time, depending on the cause. There are many different causes of confusion. Ask for help from family members or friends if you are unable to take care of yourself. This information is not intended to replace advice given to you by your health care provider. Make sure you discuss any questions you have with your health care provider. Document Revised: 08/31/2019 Document Reviewed: 08/31/2019 Elsevier Patient Education  [place]. Heart Failure Action Plan A heart failure action plan helps you understand what to do when you have symptoms of heart failure. Your action plan is a color-coded plan that lists the symptoms to watch for and indicates what actions to take. If you have symptoms in the red zone, you need medical care right away. If you have symptoms in the yellow zone, you are having problems. If you have symptoms in the green zone, you are doing well. Follow the plan that was created by you and your health care provider. Review your plan each time you visit your health care provider. Red zone These signs and symptoms mean you should get medical help right away: You have trouble breathing when resting. You have a dry cough that is getting worse. You have swelling or pain in your legs or abdomen that is getting worse. You suddenly gain more than 2-3 lb (0.9-1.4 kg) in 24 hours, or more than 5 lb (2.3 kg) in a week. This amount may be more or less depending on your condition. You have trouble staying awake or you feel confused. You have chest pain. You do not have an appetite. You pass out. You have worsening sadness or depression. If you have any of these symptoms, call your local emergency services (911 in the [place] right away. Do not drive yourself to the hospital. Yellow zone These signs and symptoms mean your condition may be getting worse and you should make some changes: You have trouble breathing when you are active, or you need to sleep with your head raised  on extra pillows to help you breathe. You have swelling in your legs or abdomen. You gain 2-3 lb (0.9-1.4 kg) in 24 hours, or 5 lb (2.3 kg) in a week. This amount may be more or less depending on your condition. You get tired easily. You have trouble sleeping. You have a dry cough. If you have any of these symptoms: Contact your health care provider within the next day. Your health care provider may adjust your medicines. Green zone These signs mean you are doing well and can continue what you are doing: You do not have shortness of breath. You have very little swelling or no new swelling. Your weight is stable (no gain or loss). You have a normal activity level. You do not have chest pain or any other new symptoms. Follow these instructions at home: Take over-the-counter and prescription medicines only  as told by your health care provider. Weigh yourself daily. Your target weight is __________ lb (__________ kg). Call your health care provider if you gain more than __________ lb (__________ kg) in 24 hours, or more than __________ lb (__________ kg) in a week. Health care provider name: _____________________________________________________ Health care provider phone number: _____________________________________________________ Eat a heart-healthy diet. Work with a diet and nutrition specialist (dietitian) to create an eating plan that is best for you. Keep all follow-up visits. This is important. Where to find more information American Heart Association: Summary A heart failure action plan helps you understand what to do when you have symptoms of heart failure. Follow the action plan that was created by you and your health care provider. Get help right away if you have any symptoms in the red zone. This information is not intended to replace advice given to you by your health care provider. Make sure you discuss any questions you have with your health care provider. Document Revised:  08/14/2021 Document Reviewed: 12/20/2019 Elsevier Patient Education  [place]. Dementia Caregiver Guide Dementia is a term used to describe a number of symptoms that affect memory and thinking. The most common symptoms include: Memory loss. Trouble with language and communication. Trouble concentrating. Poor judgment and problems with reasoning. Wandering from home or public places. Extreme anxiety or depression. Being suspicious or having angry outbursts and accusations. Child-like behavior and language. Dementia can be frightening and confusing. And taking care of someone with dementia can be challenging. This guide provides tips to help you when providing care for a person with dementia. How to help manage lifestyle changes Dementia usually gets worse slowly over time. In the early stages, people with dementia can stay independent and safe with some help. In later stages, they need help with daily tasks such as dressing, grooming, and using the bathroom. There are actions you can take to help a person manage his or her life while living with this condition. Communicating When the person is talking or seems frustrated, make eye contact and hold the person's hand. Ask specific questions that need yes or no answers. Use simple words, short sentences, and a calm voice. Only give one direction at a time. When offering choices, limit the person to just one or two. Avoid correcting the person in a negative way. If the person is struggling to find the right words, gently try to help him or her. Preventing injury  Keep floors clear of clutter. Remove rugs, magazine racks, and floor lamps. Keep hallways well lit, especially at night. Put a handrail and nonslip mat in the bathtub or shower. Put childproof locks on cabinets that contain dangerous items, such as medicines, alcohol, guns, toxic cleaning items, sharp tools or utensils, matches, and lighters. For doors to the outside of the  house, put the locks in places where the person cannot see or reach them easily. This will help ensure that the person does not wander out of the house and get lost. Be prepared for emergencies. Keep a list of emergency phone numbers and addresses in a convenient area. Remove car keys and lock garage doors so that the person does not try to get in the car and drive. Have the person wear a bracelet that tracks locations and identifies the person as having memory problems. This should be worn at all times for safety. Helping with daily life  Keep the person on track with his or her routine. Try to identify areas where the person  may need help. Be supportive, patient, calm, and encouraging. Gently remind the person that adjusting to changes takes time. Help with the tasks that the person has asked for help with. Keep the person involved in daily tasks and decisions as much as possible. Encourage conversation, but try not to get frustrated if the person struggles to find words or does not seem to appreciate your help. How to recognize stress Look for signs of stress in yourself and in the person you are caring for. If you notice signs of stress, take steps to manage it. Symptoms of stress include: Feeling anxious, irritable, frustrated, or angry. Denying that the person has dementia or that his or her symptoms will not improve. Feeling depressed, hopeless, or unappreciated. Difficulty sleeping. Difficulty concentrating. Developing stress-related health problems. Feeling like you have too little time for your own life. Follow these instructions at home: Take care of your health Make sure that you and the person you are caring for: Get regular sleep. Exercise regularly. Eat regular, nutritious meals. Take over-the-counter and prescription medicines only as told by your health care providers. Drink enough fluid to keep your urine pale yellow. Attend all scheduled health care  appointments.  General instructions Join a support group with others who are caregivers. Ask about respite care resources. Respite care can provide short-term care for the person so that you can have a regular break from the stress of caregiving. Consider any safety risks and take steps to avoid them. Organize medicines in a pill box for each day of the week. Create a plan to handle any legal or financial matters. Get legal or financial advice if needed. Keep a calendar in a central location to remind the person of appointments or other activities. Where to find support: Many individuals and organizations offer support. These include: Support groups for people with dementia. Support groups for caregivers. Counselors or therapists. Home health care services. Adult day care centers. Where to find more information Centers for Disease Control and Prevention: http://www.wolf.info/ Alzheimer's Association: [place].[employer] Family Caregiver Alliance: www.caregiver.[place]: www.alzfdn.org Contact a health care provider if: The person's health is rapidly getting worse. You are no longer able to care for the person. Caring for the person is affecting your physical and emotional health. You are feeling depressed or anxious about caring for the person. Get help right away if: The person threatens himself or herself, you, or anyone else. You feel depressed or sad, or feel that you want to harm yourself. If you ever feel like your loved one may hurt himself or herself or others, or if he or she shares thoughts about taking his or her own life, get help right away. You can go to your nearest emergency department or: Call your local emergency services (911 in the [place] Call a suicide crisis helpline, such as the [place] at (513) 334-3500 or 988 in the [place]. This is open 24 hours a day in the [place] Text the Crisis Text Line at 7062487712 (in the  [place].). Summary Dementia is a term used to describe a number of symptoms that affect memory and thinking. Dementia usually gets worse slowly over time. Take steps to reduce the person's risk of injury and to plan for future care. Caregivers need support, relief from caregiving, and time for their own lives. This information is not intended to replace advice given to you by your health care provider. Make sure you discuss any questions you have with your health care provider.  Document Revised: 11/29/2020 Document Reviewed: 09/20/2019 Elsevier Patient Education  [place].

## 2022-08-19 NOTE — Chronic Care Management (AMB) (Signed)
Chronic Care Management   CCM RN Visit Note  08/19/2022 Name: [name] MRN: [id number] DOB: [birth year]  Subjective: [name] is a 89 y.o. year old male who is a primary care patient of [name], MD. The patient was referred to the Chronic Care Management team for assistance with care management needs subsequent to provider initiation of CCM services and plan of care.    Today's Visit:   Spoke with the patients granddaughter and DRP, [name]  for initial visit.     SDOH Interventions Today    Flowsheet Row Most Recent Value  SDOH Interventions   Food Insecurity Interventions Intervention Not Indicated  Housing Interventions Intervention Not Indicated  Transportation Interventions Intervention Not Indicated  Utilities Interventions Intervention Not Indicated  Alcohol Usage Interventions Intervention Not Indicated (Score <7)  Financial Strain Interventions Intervention Not Indicated  Physical Activity Interventions Other (Comments)  [working with home health in the home the patient with decline in mobility]  Stress Interventions Intervention Not Indicated  Social Connections Interventions Intervention Not Indicated         Goals Addressed             This Visit's Progress    CCM Expected Outcome:  Monitor, Self-Manage and Reduce Symptoms of Heart Failure       Current Barriers:  Care Coordination needs related to resources and social work support  in a patient with heart failure and other chronic conditions Chronic Disease Management support and education needs related to effective management of HF Caregiver support   Planned Interventions: Basic overview and discussion of pathophysiology of Heart Failure reviewed Provided education on low sodium diet. The patient is getting meals on wheels but often does not like what they bring. His granddaughter cooks for him but sometimes he will not eat. Does like fast food. Education on  monitoring for salt and hidden sodium in his diet.  Reviewed Heart Failure Action Plan in depth and provided written copy Advised patient to weigh each morning after emptying bladder Reviewed role of diuretics in prevention of fluid overload and management of heart failure Discussed the importance of keeping all appointments with provider Provided patient with education about the role of exercise in the management of heart failure Advised patient to discuss changes in fluid balance, heart failure, questions, or concerns with provider Screening for signs and symptoms of depression related to chronic disease state  Assessed social determinant of health barriers  Symptom Management: Take medications as prescribed   Attend all scheduled provider appointments Call provider office for new concerns or questions  call the Suicide and Crisis Lifeline: 988 call the [place] National Suicide Prevention Lifeline: (579) 324-9072 or TTY: 719 157 8997 TTY 919-164-5024) to talk to a trained counselor call 1-800-273-TALK (toll free, 24 hour hotline) go to [facility] [address] [place] [phone]) if experiencing a [place] or [place]  call office if I gain more than 2 pounds in one day or 5 pounds in one week use salt in moderation watch for swelling in feet, ankles and legs every day develop a rescue plan follow rescue plan if symptoms flare-up track symptoms and what helps feel better or worse dress right for the weather, hot or cold  Follow Up Plan: Telephone follow up appointment with care management team member scheduled for: 09-30-2022 at 345 pm       CCM Expected Outcome:  Monitor, Self-Manage and Reduce Symptoms [id number]  Current Barriers:  Knowledge Deficits related to understanding changes in the patients dementia and decline with memory Care Coordination needs related to resources to help with managing changes in  mentation and mental status  in a patient with dementia  Chronic Disease Management support and education needs related to effective management of Dementia Caregiver support for caring for elderly grandfather with dementia and behavioral changes  Planned Interventions: Evaluation of current treatment plan related to dementia  and patient's adherence to plan as established by provider Advised patient to call the office for changes in mood, increased anxiety, depression, or other sx and sx of acute changes in mentation  Provided education to patient re: The book, "The 72 Hour Day", a resource to help patients and family members understand the progression of dementia and how to cope with changes seen. Reviewed medications with patient and discussed compliance. The patients granddaughter states that the patient has been known to take his medications and put them in his pocket. She says he can be sneaky. The granddaughter has spoke to the caregivers and ask the caregivers to make sure he is taking his medications. Provided patient with dementia  educational materials related to changes seen in patients with dementia and resources to help with coping with changes seen and the progression of the disease process. Reviewed scheduled/upcoming provider appointments including 09-11-2022 at 1040 am Social Work referral for support and education for dementia and changes being seen. Upcoming appointment on 08-20-2022 Discussed plans with patient for ongoing care management follow up and provided patient with direct contact information for care management team Advised patient to discuss changes she is seeing with the patient, recommendations and support with provider Screening for signs and symptoms of depression related to chronic disease state  Assessed social determinant of health barriers  Symptom Management: Take medications as prescribed   Attend all scheduled provider appointments Call provider office for  new concerns or questions  call the Suicide and Crisis Lifeline: 988 call the [place] National Suicide Prevention Lifeline: (867)508-9855 or TTY: (480)243-9950 TTY 984-467-4019) to talk to a trained counselor call 1-800-273-TALK (toll free, 24 hour hotline) go to [facility] [address] [place] [phone]) if experiencing a Mental Health or [place]   Follow Up Plan: Telephone follow up appointment with care management team member scheduled for: 09-30-2022 at 345 pm          Plan:Telephone follow up appointment with care management team member scheduled for:  09-30-2022 at 345 pm  [place], MSN, CCM RN Care Manager  Chronic Care Management Direct Number: [phone]

## 2022-08-19 NOTE — Plan of Care (Signed)
Chronic Care Management Provider Comprehensive Care Plan    08/19/2022 Name: Onis Esty MRN: [id number] DOB: 1923/12/26  Referral to Chronic Care Management (CCM) services was placed by Provider:  Dr. Abelino Derrick on Date: 07-18-2022.  Chronic Condition 1: Dementia Provider Assessment and Plan Chronic care management to arrange palliative care.   Return in about 6 weeks (around 08/29/2022), or if symptoms worsen or fail to improve.  Relevant Medications     divalproex (DEPAKOTE) 125 MG DR tablet       Expected Outcome/Goals Addressed This Visit (Provider CCM goals/Provider Assessment and plan   CCM (Dementia)  EXPECTED OUTCOME:  MONITOR, SELF-MANAGE AND REDUCE SYMPTOMS OF Dementia   Symptom Management Condition 1: Take all medications as prescribed Attend all scheduled provider appointments Call provider office for new concerns or questions  call the Suicide and Crisis Lifeline: 988 call the [place] National Suicide Prevention Lifeline: 501-825-8730 or TTY: 860-846-5188 TTY 984-005-8004) to talk to a trained counselor call 1-800-273-TALK (toll free, 24 hour hotline) go to [facility] [address] [place] [phone]) if experiencing a Mental Health or [place] Crisis   Chronic Condition 2: Heart Failure Provider Assessment and Plan  furosemide (LASIX) 64 MThere is +2 pitting edema on bilateral feet that extends to mid shin bilaterally. There is no bruising of the left foot. No warmth but mild erythema of the foot. Patient reports pain with palpation to entire foot that extends to ankle. Pain with active dorsi flexion of the foot. There is a blister to the distal end of the great toe but skin appears intact. No erythema, redness, drainage from this area. G tablet Take 1 tablet (40 mg total) by mouth daily.       Expected Outcome/Goals Addressed This Visit (Provider CCM goals/Provider Assessment and plan   CCM  (HEART FAILURE)  EXPECTED OUTCOME:  MONITOR, SELF-MANAGE AND REDUCE SYMPTOMS OF HEART FAILURE   Symptom Management Condition 2: Take all medications as prescribed Attend all scheduled provider appointments Call provider office for new concerns or questions  call the Suicide and Crisis Lifeline: 988 call the [place] National Suicide Prevention Lifeline: 657-534-9812 or TTY: 8282934114 TTY 714-120-1921) to talk to a trained counselor call 1-800-273-TALK (toll free, 24 hour hotline) go to [facility] [address], [place] [phone]) if experiencing a Mental Health or [place]  call office if I gain more than 2 pounds in one day or 5 pounds in one week keep legs up while sitting use salt in moderation watch for swelling in feet, ankles and legs every day develop a rescue plan follow rescue plan if symptoms flare-up track symptoms and what helps feel better or worse dress right for the weather, hot or cold  Problem List Patient Active Problem List   Diagnosis Date Noted   Coronary atherosclerosis 08/15/2022   Encounter for fitting and adjustment of hearing aid 08/15/2022   Hearing loss 08/15/2022   Hypertensive heart disease with heart failure 08/15/2022   Muscle weakness (generalized) 08/15/2022   Need for assistance with personal care 08/15/2022   Encounter for issue of other medical certificate 0000000   Encounter for other administrative examinations 08/15/2022   Other specified hearing loss, bilateral 08/15/2022   Pain in joint, lower leg 08/15/2022   Problem related to unspecified psychosocial circumstances 08/15/2022   Cellulitis and abscess of toe of left foot 08/08/2022   Left leg cellulitis 08/08/2022   Bilateral pleural effusion 07/22/2022  DNR (do not resuscitate)/DNI(Do Not Intubate) 07/22/2022   Edema due to hypoalbuminemia 07/22/2022   Chronic hypoxic respiratory failure, on home oxygen therapy ([name]) -  4 L/min 07/22/2022   Pressure injury of skin 07/10/2022   Essential hypertension 07/06/2022   Alzheimer disease 07/06/2022   Pleural effusion 07/06/2022   History of CVA (cerebrovascular accident) 07/06/2022   Ventral hernia 07/06/2022   Ulcer of lower extremity, limited to breakdown of skin 05/30/2022   Urinary frequency 02/14/2022   Need for influenza vaccination 02/07/2022   Anemia due to chronic kidney disease 12/31/2021   Agitation due to dementia 12/25/2021   Benign prostatic hyperplasia with urinary obstruction 12/05/2021   Lower extremity edema 12/05/2021   Scrotum swelling 12/05/2021   Elevated homocysteine 11/30/2021   Low magnesium level 11/30/2021   Balanitis 11/30/2021   Gastroesophageal reflux disease 10/22/2021   Urinary tract infection 10/22/2021   Chronic atrial fibrillation with RVR 07/30/2021   Hypophosphatemia 07/29/2021   Stage 3b chronic kidney disease 04/25/2021   Continuous leakage of urine 04/25/2021   At high risk for injury related to fall 04/25/2021   Hypocalcemia 10/30/2020   Vitamin D deficiency 10/30/2020   Edema due to malnutrition 10/30/2020   Unsteady gait when walking 09/21/2020   Sarcopenia 09/21/2020   B12 deficiency 01/27/2020   Chronic diastolic CHF (congestive heart failure) 03/09/2019   Pain in both feet 11/17/2018   Neuropathy 10/09/2018   Insomnia 10/09/2018   Slow transit constipation 06/18/2018   Iron deficiency anemia 11/25/2017   Decreased appetite 10/16/2017   Hearing difficulty of both ears 10/16/2017   Allergic rhinitis due to pollen 09/03/2017   Asymmetric SNHL (sensorineural hearing loss) 08/14/2017   Tinnitus aurium, left 08/14/2017   Ventricular tachycardia 01/14/2017   Malnutrition of moderate degree 01/13/2017   DOE (dyspnea on exertion) 01/12/2017   PVD (peripheral vascular disease) 08/14/2016   CVA (cerebral infarction) 07/23/2015   Microcytic anemia 07/23/2015   Stroke (cerebrum) 07/23/2015   Chronic kidney  disease 07/23/2015   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 03/15/2015   Primary open-angle glaucoma, left eye, indeterminate stage 03/15/2015   PCO (posterior capsule opacification) 11/29/2014   Ptosis of eyelid 07/27/2012   POAG (primary open-angle glaucoma) 11/27/2011   Trichiasis 11/27/2011   PPM-Medtronic 03/13/2010   Type 2 diabetes mellitus without complication, without long-term current use of insulin 11/14/2008   Atrial fibrillation 11/14/2008   BRADYCARDIA-TACHYCARDIA SYNDROME s/p PPM 11/14/2008    Medication Management  Current Outpatient Medications:    apixaban (ELIQUIS) 2.5 MG TABS tablet, Take 1 tablet (2.5 mg total) by mouth 2 (two) times daily., Disp: 60 tablet, Rfl: 1   atorvastatin (LIPITOR) 10 MG tablet, atorvastatin 10 mg tablet TAKE 1 TABLET BY MOUTH ONCE DAILY AT BEDTIME, Disp: , Rfl:    divalproex (DEPAKOTE SPRINKLE) 125 MG capsule, Take 1 capsule (125 mg total) by mouth 2 (two) times daily., Disp: 60 capsule, Rfl: 0   docusate sodium (COLACE) 100 MG capsule, Take 1 capsule (100 mg total) by mouth daily., Disp: 30 capsule, Rfl: 0   finasteride (PROSCAR) 5 MG tablet, TAKE 1 TABLET (5 MG TOTAL) BY MOUTH DAILY., Disp: 90 tablet, Rfl: 1   furosemide (LASIX) 40 MG tablet, Take 1 tablet (40 mg total) by mouth daily., Disp: 30 tablet, Rfl: 3   Iron, Ferrous Sulfate, 325 (65 Fe) MG TABS, Take 1 tablet every other day. (Patient taking differently: Take 1 tablet by mouth every other day.), Disp: 45 tablet, Rfl: 1   latanoprost (XALATAN)  0.005 % ophthalmic solution, Place 1 drop into both eyes at bedtime., Disp: , Rfl:    loratadine (CLARITIN) 10 MG tablet, Take 10 mg by mouth daily as needed for rhinitis., Disp: , Rfl:    Melatonin 3 MG TBDP, Take 1 tablet by mouth at bedtime. (Patient taking differently: Take 3 mg by mouth at bedtime.), Disp: 30 tablet, Rfl: 0   memantine (NAMENDA) 10 MG tablet, Take 10 mg by mouth 2 (two) times daily., Disp: , Rfl:    metoprolol succinate  (TOPROL-XL) 25 MG 24 hr tablet, TAKE 1 TAB EVERY EVENING, Disp: , Rfl:    midodrine (PROAMATINE) 2.5 MG tablet, Take 1 tablet (2.5 mg total) by mouth 3 (three) times daily with meals., Disp: 90 tablet, Rfl: 1   pantoprazole (PROTONIX) 40 MG tablet, TAKE 1 TABLET(40 MG) BY MOUTH DAILY (Patient taking differently: Take 40 mg by mouth daily.), Disp: 90 tablet, Rfl: 1   pioglitazone (ACTOS) 15 MG tablet, TAKE 1 TABLET (15 MG TOTAL) BY MOUTH DAILY., Disp: 90 tablet, Rfl: 1   tamsulosin (FLOMAX) 0.4 MG CAPS capsule, Take 1 capsule (0.4 mg total) by mouth daily., Disp: 90 capsule, Rfl: 3  Cognitive Assessment Identity Confirmed: : Name; DOB Cognitive Status: Normal   Functional Assessment Hearing Difficulty or Deaf: yes Hearing Management: hard of hearing, has hearing aides but doesnt' wear glasses Wear Glasses or Blind: yes Vision Management: wears glasses Concentrating, Remembering or Making Decisions Difficulty (CP): yes Concentration Management: has dementia, changes in his memory Difficulty Communicating: no Difficulty Eating/Swallowing: no Walking or Climbing Stairs Difficulty: yes Walking or Climbing Stairs: ambulation difficulty, requires equipment Mobility Management: walks with a walker Dressing/Bathing Difficulty: yes Dressing/Bathing: bathing difficulty, assistance 1 person Dressing/Bathing Management: he needs help with bathing Doing Errands Independently Difficulty (such as shopping) (CP): yes Errands Management: the patients granddaughter and husband and paid caregivers take care of the patient 24/7   Caregiver Assessment  Primary Source of Support/Comfort: child(ren); nonrelative caregiver Name of Support/Comfort Primary Source: Joycelyn Schmid- granddaughter People in Home: other relative(s); grandchild(ren) (paid caregivers in the home 24/7 and family) Family Caregiver if Needed: grandchild(ren), adult Family Caregiver Names: Joycelyn Schmid- granddaughter Primary  Roles/Responsibilities: retired   Planned Interventions  Evaluation of current treatment plan related to dementia  and patient's adherence to plan as established by provider Advised patient to call the office for changes in mood, increased anxiety, depression, or other sx and sx of acute changes in mentation  Provided education to patient re: The book, "The 51 Hour Day", a resource to help patients and family members understand the progression of dementia and how to cope with changes seen. Reviewed medications with patient and discussed compliance. The patients granddaughter states that the patient has been known to take his medications and put them in his pocket. She says he can be sneaky. The granddaughter has spoke to the caregivers and ask the caregivers to make sure he is taking his medications. Provided patient with dementia  educational materials related to changes seen in patients with dementia and resources to help with coping with changes seen and the progression of the disease process. Reviewed scheduled/upcoming provider appointments including 09-11-2022 at 1040 am Social Work referral for support and education for dementia and changes being seen. Upcoming appointment on 08-20-2022 Discussed plans with patient for ongoing care management follow up and provided patient with direct contact information for care management team Advised patient to discuss changes she is seeing with the patient, recommendations and support with provider  Screening for signs and symptoms of depression related to chronic disease state  Assessed social determinant of health barriers Basic overview and discussion of pathophysiology of Heart Failure reviewed Provided education on low sodium diet. The patient is getting meals on wheels but often does not like what they bring. His granddaughter cooks for him but sometimes he will not eat. Does like fast food. Education on monitoring for salt and hidden sodium in his diet.   Reviewed Heart Failure Action Plan in depth and provided written copy Advised patient to weigh each morning after emptying bladder Reviewed role of diuretics in prevention of fluid overload and management of heart failure Discussed the importance of keeping all appointments with provider Provided patient with education about the role of exercise in the management of heart failure Advised patient to discuss changes in fluid balance, heart failure, questions, or concerns with provider Screening for signs and symptoms of depression related to chronic disease state  Assessed social determinant of health barriers     Interaction and coordination with outside resources, practitioners, and providers See CCM Referral  Care Plan: Available in MyChart

## 2022-08-20 ENCOUNTER — Ambulatory Visit: Payer: Self-pay | Admitting: Licensed Clinical Social Worker

## 2022-08-20 LAB — WOUND CULTURE
MICRO NUMBER:: 14755163
RESULT:: NO GROWTH
SPECIMEN QUALITY:: ADEQUATE

## 2022-08-20 NOTE — Patient Instructions (Signed)
Visit Information  Thank you for taking time to visit with me today. Please don't hesitate to contact me if I can be of assistance to you.   Following are the goals we discussed today:   Goals Addressed             This Visit's Progress    COMPLETED: Care Coordination for Caregiver support       Activities and task to complete in order to accomplish goals.  Granddaughter will assist Continue meeting with the therapist from the [place]   Also consider getting your own therapist  Continue working with your [facility] social worker for ongoing support related to in-home care and possible facility placement       Please call the care guide team at [phone] if you need to cancel or reschedule your appointment.   If you are experiencing a Mental Health or [place] or need someone to talk to, please call 1-800-273-TALK (toll free, 24 hour hotline)   Patient's granddaughter verbalizes understanding of instructions and care plan provided today and agrees to view in [place]. Active MyChart status and patient understanding of how to access instructions and care plan via MyChart confirmed with patient.     No further follow up required: Social work Care Coordination at this time  Casimer Lanius, [place] [phone]

## 2022-08-20 NOTE — Patient Outreach (Signed)
  Care Coordination  Initial Visit Note   08/20/2022 Name: [name] MRN: [id number] DOB: 1924/01/29  [name] is a 89 y.o. year old male who sees [name], MD for primary care. I spoke with  [name] 's granddaughter Ms. Ronnald Ramp by phone today.  What matters to the patients health and wellness today?  Making sure patient has the care that he needs.  Patient has all needed services at this time:  [place] personal care services and private pay for a total of 24 hour care Patient and granddaughter have a therapist via [place], they also have a [place] Education officer, museum that will provide ongoing support for in-home and placement needs.     Goals Addressed             This Visit's Progress    COMPLETED: Care Coordination for Caregiver support       Activities and task to complete in order to accomplish goals.  Granddaughter will assist Continue meeting with the therapist from the [place]   Also consider getting your own therapist  Continue working with your [facility] social worker for ongoing support related to in-home care and possible facility placement       SDOH assessments and interventions completed:  Yes   Care Coordination Interventions:  Yes, provided  Interventions Today    Flowsheet Row Most Recent Value  Chronic Disease   Chronic disease during today's visit Hypertension (HTN), Congestive Heart Failure (CHF), Diabetes, Other  [Alzheimer disease]  General Interventions   General Interventions Discussed/Reviewed General Interventions Discussed, Level of Care  Level of [place]  [discussed options,  not interested in long-term care at this time]  Education Interventions   Education Provided Provided Education  Provided Verbal Education On Mental Health/Coping with Illness  Mental Health Interventions   Mental Health Discussed/Reviewed Mental Health Discussed, Coping Strategies  [solution focused, emotional  support,]       Follow up plan: No further intervention required. Family has all needed support at with [place] social work. LCSW will connect if needed.  Encounter Outcome:  Pt. Visit Completed   Casimer Lanius, [place] [phone]

## 2022-08-21 NOTE — Progress Notes (Signed)
Remote pacemaker transmission.   

## 2022-08-22 ENCOUNTER — Ambulatory Visit (INDEPENDENT_AMBULATORY_CARE_PROVIDER_SITE_OTHER): Payer: PPO | Admitting: Neurology

## 2022-08-22 ENCOUNTER — Other Ambulatory Visit: Payer: Self-pay

## 2022-08-22 ENCOUNTER — Encounter: Payer: Self-pay | Admitting: Neurology

## 2022-08-22 VITALS — BP 97/58 | HR 104 | Ht 71.0 in | Wt 148.0 lb

## 2022-08-22 VITALS — BP 110/66 | HR 110 | Temp 99.6°F

## 2022-08-22 DIAGNOSIS — F03A0 Unspecified dementia, mild, without behavioral disturbance, psychotic disturbance, mood disturbance, and anxiety: Secondary | ICD-10-CM | POA: Diagnosis not present

## 2022-08-22 DIAGNOSIS — Z8673 Personal history of transient ischemic attack (TIA), and cerebral infarction without residual deficits: Secondary | ICD-10-CM | POA: Diagnosis not present

## 2022-08-22 DIAGNOSIS — Z515 Encounter for palliative care: Secondary | ICD-10-CM

## 2022-08-22 MED ORDER — MEMANTINE HCL 10 MG PO TABS: 10.0000 mg | ORAL_TABLET | Freq: Two times a day (BID) | ORAL | 3 refills | Status: DC

## 2022-08-22 NOTE — Patient Instructions (Signed)
I had a long discussion with the patient and his granddaughter and her husband regarding his mild Alzheimer's dementia which remains stable on the current dosage of Namenda which is tolerating well.  He also has hyperhomocysteinemia for which he was taking folic acid which has been stopped In an effort to reduce his medications..  He has 24-hour care at home which the family is providing.  Patient encouraged to ambulate with a cane with one-person assist.  Return for follow-up in 1 year  or call earlier if necessary

## 2022-08-22 NOTE — Progress Notes (Signed)
PATIENT NAME: [name] DOB: 09-Mar-1924 MRN: [id number]  PRIMARY CARE PROVIDER: Mliss Sax, MD  RESPONSIBLE PARTY:  Acct ID - Guarantor Home Phone Work Phone Relationship Acct Type  [id number] [name], [name] [phone]  Self P/F     [address], [place], [place] [address]    Palliative Care Initial Encounter Note   Completed home visit.  Granddaughter Hessie Diener and Lupita Leash caregiver and [employer] from the [place] also present.   Today's Visit:   Respiratory: SOB at times and has PRN O2  Cardiac: CHF  Cognitive: alert and oriented gave a Dementia booklet; discussed self care with Lupita Leash who became teary-eyed at the thought of her grandfather becoming more dependent on others   Appetite: eats 2-3 reduced meals and snacks; drinks when he's thirsty but drinks enough throughout the day d/t caregivers  GI/GU: daily BM and takes Colace   Mobility: uses the walker in and out of the home; today the patient is going to Bible Study at his church  ADLs: depends on Lupita Leash to do ADLs   Sleeping Pattern: sleeps well throughout the night  Pain: denies pain at this time  Wt: today he weighed 160 lbs    Palliative Care/ Hospice: LPN explained role and purpose of palliative care including visit frequency. Also discussed benefits of hospice care as well as the differences between the two with patient.   Goals of Care: To stay in the home with caregivers   CODE STATUS: DNR ADVANCED DIRECTIVES: Y MOST FORM: N PPS: 40%   PHYSICAL EXAM:   VITALS: Today's Vitals   08/22/22 0841  BP: 110/66  Pulse: (!) 110  Temp: 99.6 F (37.6 C)  TempSrc: Temporal  SpO2: 97%  PainSc: 0-No pain    LUNGS: clear to auscultation , decreased breath sounds CARDIAC: Cor irreg, irreg RRR EXTREMITIES: BLE are bandaged d/t fluid seeping thru the skin; L toe is dark and hard on the underside SKIN: dry, warm NEURO: negative except for gait problems, memory problems, and  weakness       Shatasia Cutshaw Clementeen Graham, LPN

## 2022-08-22 NOTE — Progress Notes (Signed)
[facility] [address] [place]. [place] [address] [phone]       OFFICE FOLLOW-UP VISIT NOTE  [name] Date of Birth:  18-Apr-1924 Medical Record Number:  [id number]   Referring MD: [name]  Reason for Referral: Memory loss  HPI: Initial visit 08/08/2021 [name] is a 89 year old African-American male seen today for office consultation visit for memory loss.  He is accompanied by his granddaughter and grandson.  History is obtained from them and review of electronic medical records and opossum reviewed pertinent available imaging films in PACS.  He has past medical history of atrial fibrillation, chronic kidney disease, hypertension, pacemaker, right MCA infarct in March 2017 from which she recovered quite well with no permanent deficits.  His daughter informs me that they have noticed cognitive decline gradually over the last year or so.  Recently was hospitalized with small bowel obstruction on 07/29/2021 and during hospitalization he had a lot of sundowning and agitated behavior and not following doctors orders.  He was felt to likely have dementia and advised to follow-up with me for further evaluation and treatment for the same.  The patient is stubborn is often refusing his medications.  He refused to follow instructions.  The family has noticed significant cognitive decline as well as some intermittent urinary and stool incontinence at times as well.  Granddaughter has had to move in and live with the patient and he was independent prior to this.  He remains on Xarelto for his chronic A-fib.  His blood pressure is well controlled today it is 115/73.  He is tolerating Lipitor without muscle aches and pains.  He has not had a formal evaluation for dementia and has not tried medications like Aricept or Namenda.  CT scan of the head on 05/24/2020 showed no acute abnormality and stable generalized cerebral atrophy and changes of small vessel  disease.  Vitamin B12 level on 04/25/2021 was low normal at 260.  TSH was normal at 2.8 on 03/21/2020. Update 02/20/2022 : He returns for follow-up after last visit 6 months ago.  He is accompanied by his granddaughter and her husband as well as his Systems developer.  Patient is tolerating Namenda well without any side effects.  Family feels he is cognitively unchanged.  Continues to have short-term memory difficulties.  His gait and balance are poor he had a fall 2 weeks ago and he was seen in the ER fortunately had no fractures and CT scan of the head showed no acute abnormalities.  Patient has a Marine scientist.  He is able to walk with assistance using a cane with one-person right next to him.  Patient had lab work done at last visit which showed elevated homocystine levels of 32.1.  He was started on folic acid but no follow-up labs have been done.  Vitamin B12, TSH and RPR were all negative.  EEG was ordered but has not been done.  CT scan of the head without contrast on 02/05/2022 showed no acute abnormalities.  Patient's behavior has been quite calm.  He does not get agitated or violent.  He does not have significant hallucinations or delusions and can be easily redirected when he gets anxious or upset.  His family provides 24-hour care for him at home and did not like the care he was getting in a nursing home. Update 08/22/2022 : He returns for follow-up after last visit 6 months ago.  He is accompanied by his granddaughter and husband.  Patient continues  to stay at home and is getting excellent 24-hour care from his family.  Continues to have memory and mild cognitive difficulties.  He has good days and bad days.  He continues to have poor gait and balance but is able to ambulate with a walker with one-person standing close by.  He was admitted twice last month to the hospital once for CHF exacerbation in early March and then 2 weeks later he was bumped his great toe and developed a wound.  He is currently getting  palliative care nurse check on his wound at home daily.  His primary care physician has reduced his medications and stop folic acid.  He remains on Depakote sprinkles 125 mg twice daily for behavioral agitation and seems to keeping him calm.  He is tolerating Namenda 10 mg twice daily without any side effects.  He has stopped his Toprol-XL and midodrine has been added for his low blood pressure.  Remains on Eliquis for stroke prevention for his A-fib and has not had any significant bleeding episodes.  He has not had any recurrent stroke or TIA symptoms. ROS:   14 system review of systems is positive for memory loss, sundowning, agitation, confusion, disorientation stubborn behavior, incontinence occasionally of bowel and bladder all other systems negative  PMH:  Past Medical History:  Diagnosis Date   Acute CVA (cerebrovascular accident)    Arthritis    Atrial fibrillation    CKD (chronic kidney disease)    History of hiatal hernia    Hx SBO    Last 2013 all treated conservatively   Hypertension    Presence of permanent cardiac pacemaker    Prostate cancer    Stroke 2017   left facial drooping noted 08/14/2016   Tachycardia-bradycardia syndrome    Type II diabetes mellitus    Ventral hernia    x3    Social History:  Social History   Socioeconomic History   Marital status: Widowed    Spouse name: Not on file   Number of children: Not on file   Years of education: Not on file   Highest education level: Not on file  Occupational History   Not on file  Tobacco Use   Smoking status: Former    Packs/day: 1.50    Years: 15.00    Additional pack years: 0.00    Total pack years: 22.50    Types: Cigarettes    Quit date: 04/16/1955    Years since quitting: 67.3    Passive exposure: Past   Smokeless tobacco: Former    Types: Chew    Quit date: 08/17/1952  Vaping Use   Vaping Use: Never used  Substance and Sexual Activity   Alcohol use: No    Comment: 08/14/2016 "drank beer when  I was a teenager"   Drug use: No   Sexual activity: Not Currently  Other Topics Concern   Not on file  Social History Narrative   Lives alone   Social Determinants of Health   Financial Resource Strain: Low Risk  (08/19/2022)   Overall Financial Resource Strain (CARDIA)    Difficulty of Paying Living Expenses: Not hard at all  Food Insecurity: No Food Insecurity (08/19/2022)   Hunger Vital Sign    Worried About Running Out of Food in the Last Year: Never true    [place] in the Last Year: Never true  Transportation Needs: No Transportation Needs (08/19/2022)   PRAPARE - Hydrologist (Medical):  No    Lack of Transportation (Non-Medical): No  Physical Activity: Sufficiently Active (08/19/2022)   Exercise Vital Sign    Days of Exercise per Week: 5 days    Minutes of Exercise per Session: 30 min  Stress: No Stress Concern Present (08/19/2022)   [place]    Feeling of Stress : Only a little  Social Connections: Moderately Isolated (08/19/2022)   Social Connection and Isolation Panel [NHANES]    Frequency of Communication with Friends and Family: More than three times a week    Frequency of Social Gatherings with Friends and Family: More than three times a week    Attends Religious Services: More than 4 times per year    Active Member of [employer] or Organizations: No    Attends Archivist Meetings: Never    Marital Status: Widowed  Intimate Partner Violence: Not At Risk (08/19/2022)   Humiliation, Afraid, Rape, and Kick questionnaire    Fear of Current or Ex-Partner: No    Emotionally Abused: No    Physically Abused: No    Sexually Abused: No    Medications:   Current Outpatient Medications on File Prior to Visit  Medication Sig Dispense Refill   apixaban (ELIQUIS) 2.5 MG TABS tablet Take 1 tablet (2.5 mg total) by mouth 2 (two) times daily. 60 tablet 1   atorvastatin (LIPITOR) 10  MG tablet atorvastatin 10 mg tablet TAKE 1 TABLET BY MOUTH ONCE DAILY AT BEDTIME     divalproex (DEPAKOTE SPRINKLE) 125 MG capsule Take 1 capsule (125 mg total) by mouth 2 (two) times daily. 60 capsule 0   docusate sodium (COLACE) 100 MG capsule Take 1 capsule (100 mg total) by mouth daily. 30 capsule 0   finasteride (PROSCAR) 5 MG tablet TAKE 1 TABLET (5 MG TOTAL) BY MOUTH DAILY. 90 tablet 1   furosemide (LASIX) 40 MG tablet Take 1 tablet (40 mg total) by mouth daily. 30 tablet 3   Iron, Ferrous Sulfate, 325 (65 Fe) MG TABS Take 1 tablet every other day. (Patient taking differently: Take 1 tablet by mouth every other day.) 45 tablet 1   latanoprost (XALATAN) 0.005 % ophthalmic solution Place 1 drop into both eyes at bedtime.     loratadine (CLARITIN) 10 MG tablet Take 10 mg by mouth daily as needed for rhinitis.     Melatonin 3 MG TBDP Take 1 tablet by mouth at bedtime. (Patient taking differently: Take 3 mg by mouth at bedtime.) 30 tablet 0   metoprolol succinate (TOPROL-XL) 25 MG 24 hr tablet TAKE 1 TAB EVERY EVENING     midodrine (PROAMATINE) 2.5 MG tablet Take 1 tablet (2.5 mg total) by mouth 3 (three) times daily with meals. 90 tablet 1   pantoprazole (PROTONIX) 40 MG tablet TAKE 1 TABLET(40 MG) BY MOUTH DAILY (Patient taking differently: Take 40 mg by mouth daily.) 90 tablet 1   tamsulosin (FLOMAX) 0.4 MG CAPS capsule Take 1 capsule (0.4 mg total) by mouth daily. 90 capsule 3   No current facility-administered medications on file prior to visit.    Allergies:  No Known Allergies  Physical Exam General: Frail elderly African-American male., seated, in no evident distress Head: head normocephalic and atraumatic.   Neck: supple with no carotid or supraclavicular bruits Cardiovascular: regular rate and rhythm, no murmurs Musculoskeletal: Mild kyphoscoliosis. Skin:  no rash/petichiae pacemaker left infraclavicular region. Vascular:  Normal pulses all extremities  Neurologic Exam Mental  Status: Awake and  fully alert. Oriented to place and time. Recent and remote memory intact. Attention span, concentration and fund of knowledge appropriate. Mood and affect appropriate.  Mini-Mental status exam not done ( last visit scored 21/30  )with deficits in recall, repetition, writing and drawing.  Unable to copy intersecting pentagons.    Palmomental reflex present bilaterally. Cranial Nerves: Fundoscopic exam not done. Pupils equal, briskly reactive to light. Extraocular movements full without nystagmus. Visual fields full to confrontation. Hearing significantly diminished bilaterally.. Facial sensation intact. Face, tongue, palate moves normally and symmetrically.  Motor: Normal bulk and tone. Normal strength in all tested extremity muscles. Sensory.: intact to touch , pinprick , position and vibratory sensation.  Unsteady while standing on either foot unsupported and on a narrow base. Coordination: Rapid alternating movements normal in all extremities. Finger-to-nose and heel-to-shin performed accurately bilaterally. Gait and Station: Gait deferred today.  Patient in wheelchair. Reflexes: 1+ and symmetric. Toes downgoing.          02/20/2022    2:20 PM 08/08/2021    9:20 AM  MMSE - Mini Mental State Exam  Orientation to time 5 4  Orientation to Place 5 5  Registration 3 3  Attention/ Calculation 0 0  Recall 0 0  Language- name 2 objects 2 2  Language- repeat 1 0  Language- follow 3 step command 3 3  Language- read & follow direction 1 1  Write a sentence 1 1  Copy design 0 0  Total score 21 19       ASSESSMENT: 89 year old African-American male with recent worsening cognition and memory loss and sundowning likely due to mild Alzheimer's dementia.  He is stable on current dose of Namenda History of right MCA infarct in March 2017 secondary to A-fib from which she recovered quite well with practically no lasting deficits.    PLAN:I had a long discussion with the patient and  his granddaughter and her husband regarding his mild Alzheimer's dementia which remains stable on the current dosage of Namenda which is tolerating well.  He also has hyperhomocysteinemia for which he was taking folic acid which has been stopped In an effort to reduce his medications..  He has 24-hour care at home which the family is providing.  Patient encouraged to ambulate with a cane with one-person assist.  Continue Eliquis for stroke prevention for his A-fib and maintain aggressive risk factor modification.  Return for follow-up in 1 year  or call earlier if necessary. Greater than 50% time during this 35-minute visit was spent on counseling and coordination of care about his memory loss and cognitive impairment and dementia and planning evaluation and treatment and answering questions  Antony Contras, MD Note: This document was prepared with digital dictation and possible smart phrase technology. Any transcriptional errors that result from this process are unintentional.

## 2022-08-28 ENCOUNTER — Encounter: Payer: Self-pay | Admitting: Cardiovascular Disease

## 2022-09-03 ENCOUNTER — Ambulatory Visit (INDEPENDENT_AMBULATORY_CARE_PROVIDER_SITE_OTHER): Payer: PPO | Admitting: Podiatry

## 2022-09-03 DIAGNOSIS — I739 Peripheral vascular disease, unspecified: Secondary | ICD-10-CM

## 2022-09-03 DIAGNOSIS — L97521 Non-pressure chronic ulcer of other part of left foot limited to breakdown of skin: Secondary | ICD-10-CM

## 2022-09-03 NOTE — Progress Notes (Signed)
  Subjective:  Patient ID: [name], male    DOB: [birth year],  MRN: [id number]  Chief Complaint  Patient presents with   Foot Ulcer    3 week follow up left foot    89 y.o. male presents with the above complaint. History confirmed with patient. He returns for follow-up says it has been doing a bit better they have been using the Betadine  Objective:  Physical Exam: warm, good capillary refill, weakly palpable DP and PT pulses, normal sensory exam, and hemorrhagic bulla plantar right great toe, much improved there is some still area of central bulla formation, no signs of infection, healed blister on the left second toe  Culture showed no growth  Assessment:   1. Skin ulcer of left great toe, limited to breakdown of skin   2. PVD (peripheral vascular disease)      Plan:  Patient was evaluated and treated and all questions answered.  Has improved quite a bit but has some residual bulla formation and possibly local ulceration on the great toe.  I did recommend evaluating with noninvasive vascular testing to determine if there is PAD at factor here.  I recommend continuing leave open to air and can apply Betadine daily.  Advised on signs and symptoms of infection.  They will be scheduled for the noninvasive vascular testing.  I also recommended supporting with a surgical shoe, this was dispensed today.  He was unable to utilize a peg assist due to the size and fit so we applied a silicone offloading toe crest to take pressure off of the toe.  An open toed surgical shoe is necessary because I believe he is getting too much pressure on his regular shoes at home creating the blisters formation.  Return in about 3 weeks (around 09/24/2022) for wound care.

## 2022-09-07 DIAGNOSIS — I10 Essential (primary) hypertension: Secondary | ICD-10-CM | POA: Diagnosis not present

## 2022-09-07 DIAGNOSIS — J90 Pleural effusion, not elsewhere classified: Secondary | ICD-10-CM | POA: Diagnosis not present

## 2022-09-08 DIAGNOSIS — J90 Pleural effusion, not elsewhere classified: Secondary | ICD-10-CM | POA: Diagnosis not present

## 2022-09-09 ENCOUNTER — Telehealth: Payer: Self-pay | Admitting: *Deleted

## 2022-09-09 NOTE — Telephone Encounter (Signed)
Nurse w/ [facility] is calling because they are seeing patient once weekly and there has been a slightly change to the left great toe(underneath)appears to be a deep tissue injury,entire is hard now, another place has developed underneath the left 2nd toe.  What like an updated orders on what to do or how to treat until his next appointment,is wearing his shoe and gel inserts given by physician,  please advise.

## 2022-09-09 NOTE — Telephone Encounter (Signed)
Called and updated nurse w/ [facility] thru voice message and to call back if questions.

## 2022-09-11 ENCOUNTER — Encounter: Payer: Self-pay | Admitting: Family Medicine

## 2022-09-11 ENCOUNTER — Ambulatory Visit (INDEPENDENT_AMBULATORY_CARE_PROVIDER_SITE_OTHER): Payer: PPO | Admitting: Family Medicine

## 2022-09-11 VITALS — BP 108/64 | HR 91 | Temp 98.2°F | Ht 71.0 in | Wt 151.0 lb

## 2022-09-11 DIAGNOSIS — I952 Hypotension due to drugs: Secondary | ICD-10-CM

## 2022-09-11 DIAGNOSIS — I517 Cardiomegaly: Secondary | ICD-10-CM

## 2022-09-11 DIAGNOSIS — M6281 Muscle weakness (generalized): Secondary | ICD-10-CM | POA: Diagnosis not present

## 2022-09-11 DIAGNOSIS — I482 Chronic atrial fibrillation, unspecified: Secondary | ICD-10-CM

## 2022-09-11 NOTE — Progress Notes (Signed)
Established Patient Office Visit   Subjective:  Patient ID: [name], male    DOB: 1923/08/07  Age: 89 y.o. MRN: [id number]  Chief Complaint  Patient presents with   Foot Pain    Weakness     Foot Pain Associated symptoms include weakness. Pertinent negatives include no abdominal pain, chest pain, myalgias or rash.   Encounter Diagnoses  Name Primary?   Chronic atrial fibrillation Yes   Hypotension due to drugs    LVH (left ventricular hypertrophy)    Muscle weakness (generalized)    Follows up accompanied by his son-in-law.  Ongoing progressive weakness that seems to be worse in the lower extremities.  He is mostly wheelchair-bound.  He is currently working with physical therapist weekly.  He is mostly wheelchair-bound.  Continues to eat 3 meals a day but only actually consumes about a handful of food.  Continues to refuse medicines at times.  He may also hide medicines at times.  He is seeing podiatry for foot pain.  Vascular studies are planned.   Review of Systems  Constitutional: Negative.   HENT: Negative.    Eyes:  Negative for blurred vision, discharge and redness.  Respiratory: Negative.  Negative for shortness of breath.   Cardiovascular: Negative.  Negative for chest pain.  Gastrointestinal:  Negative for abdominal pain.  Genitourinary: Negative.   Musculoskeletal:  Positive for joint pain. Negative for myalgias.  Skin:  Negative for rash.  Neurological:  Positive for weakness. Negative for tingling and loss of consciousness.  Endo/Heme/Allergies:  Negative for polydipsia.     Current Outpatient Medications:    apixaban (ELIQUIS) 2.5 MG TABS tablet, Take 1 tablet (2.5 mg total) by mouth 2 (two) times daily., Disp: 60 tablet, Rfl: 1   atorvastatin (LIPITOR) 10 MG tablet, atorvastatin 10 mg tablet TAKE 1 TABLET BY MOUTH ONCE DAILY AT BEDTIME, Disp: , Rfl:    divalproex (DEPAKOTE SPRINKLE) 125 MG capsule, Take 1 capsule (125 mg total) by mouth 2  (two) times daily., Disp: 60 capsule, Rfl: 0   docusate sodium (COLACE) 100 MG capsule, Take 1 capsule (100 mg total) by mouth daily., Disp: 30 capsule, Rfl: 0   finasteride (PROSCAR) 5 MG tablet, TAKE 1 TABLET (5 MG TOTAL) BY MOUTH DAILY., Disp: 90 tablet, Rfl: 1   Iron, Ferrous Sulfate, 325 (65 Fe) MG TABS, Take 1 tablet every other day. (Patient taking differently: Take 1 tablet by mouth every other day.), Disp: 45 tablet, Rfl: 1   latanoprost (XALATAN) 0.005 % ophthalmic solution, Place 1 drop into both eyes at bedtime., Disp: , Rfl:    loratadine (CLARITIN) 10 MG tablet, Take 10 mg by mouth daily as needed for rhinitis., Disp: , Rfl:    Melatonin 3 MG TBDP, Take 1 tablet by mouth at bedtime. (Patient taking differently: Take 3 mg by mouth at bedtime.), Disp: 30 tablet, Rfl: 0   memantine (NAMENDA) 10 MG tablet, Take 1 tablet (10 mg total) by mouth 2 (two) times daily., Disp: 190 tablet, Rfl: 3   metoprolol succinate (TOPROL-XL) 25 MG 24 hr tablet, TAKE 1 TAB EVERY EVENING, Disp: , Rfl:    midodrine (PROAMATINE) 2.5 MG tablet, Take 1 tablet (2.5 mg total) by mouth 3 (three) times daily with meals., Disp: 90 tablet, Rfl: 1   pantoprazole (PROTONIX) 40 MG tablet, TAKE 1 TABLET(40 MG) BY MOUTH DAILY (Patient taking differently: Take 40 mg by mouth daily.), Disp: 90 tablet, Rfl: 1   tamsulosin (FLOMAX) 0.4 MG CAPS capsule,  Take 1 capsule (0.4 mg total) by mouth daily., Disp: 90 capsule, Rfl: 3   furosemide (LASIX) 40 MG tablet, Take 1 tablet (40 mg total) by mouth daily., Disp: 30 tablet, Rfl: 3   Objective:     BP 108/64   Pulse 91   Temp 98.2 F (36.8 C)   Ht  (1.803 m)   Wt 151 lb (68.5 kg)   SpO2 98%   BMI 21.06 kg/m  Wt Readings from Last 3 Encounters:  09/11/22 151 lb (68.5 kg)  08/22/22 148 lb (67.1 kg)  08/07/22 148 lb (67.1 kg)      Physical Exam Constitutional:      General: He is not in acute distress.    Appearance: Normal appearance. He is not ill-appearing,  toxic-appearing or diaphoretic.  HENT:     Head: Normocephalic and atraumatic.     Right Ear: External ear normal.     Left Ear: External ear normal.  Eyes:     General: No scleral icterus.       Right eye: No discharge.        Left eye: No discharge.     Extraocular Movements: Extraocular movements intact.     Conjunctiva/sclera: Conjunctivae normal.  Cardiovascular:     Rate and Rhythm: Rhythm irregularly irregular.  Pulmonary:     Effort: Pulmonary effort is normal. No respiratory distress.     Breath sounds: No wheezing, rhonchi or rales.  Musculoskeletal:     Right lower leg: 1+ Edema present.     Left lower leg: 1+ Edema present.  Skin:    General: Skin is warm and dry.  Neurological:     Mental Status: He is alert and oriented to person, place, and time.  Psychiatric:        Mood and Affect: Mood normal.        Behavior: Behavior normal.      No results found for any visits on 09/11/22.    The ASCVD Risk score (Arnett DK, et al., 2019) failed to calculate for the following reasons:   The 2019 ASCVD risk score is only valid for ages 33 to 27   The patient has a prior MI or stroke diagnosis    Assessment & Plan:   Chronic atrial fibrillation  Hypotension due to drugs  LVH (left ventricular hypertrophy)  Muscle weakness (generalized)    Return in about 8 weeks (around 11/06/2022).  Continue follow-up with podiatry for chronic foot pain.  Vascular studies of his lower extremities are planned.  Continue working with physical therapy for generalized weakness.  Nutrition as tolerated.  Continue above medicines as he is willing to take them.  Mliss Sax, MD

## 2022-09-16 ENCOUNTER — Ambulatory Visit (HOSPITAL_COMMUNITY)
Admission: RE | Admit: 2022-09-16 | Discharge: 2022-09-16 | Disposition: A | Discharge status: Home / Self Care | Payer: PPO | Source: Ambulatory Visit | Attending: Podiatry | Admitting: Podiatry

## 2022-09-16 DIAGNOSIS — I739 Peripheral vascular disease, unspecified: Secondary | ICD-10-CM

## 2022-09-16 DIAGNOSIS — L97521 Non-pressure chronic ulcer of other part of left foot limited to breakdown of skin: Secondary | ICD-10-CM | POA: Diagnosis not present

## 2022-09-16 LAB — VAS US PAD ABI
Left ABI: 0.57
Right ABI: 1.16

## 2022-09-17 ENCOUNTER — Telehealth: Payer: Self-pay | Admitting: Family Medicine

## 2022-09-17 DIAGNOSIS — F0390 Unspecified dementia without behavioral disturbance: Secondary | ICD-10-CM

## 2022-09-17 DIAGNOSIS — I5030 Unspecified diastolic (congestive) heart failure: Secondary | ICD-10-CM

## 2022-09-17 NOTE — Telephone Encounter (Signed)
Noted  

## 2022-09-17 NOTE — Telephone Encounter (Signed)
Granddaughter called to let you know she faxes the fmla paperwork.

## 2022-09-19 ENCOUNTER — Other Ambulatory Visit: Payer: Self-pay

## 2022-09-19 VITALS — BP 112/68 | HR 87 | Temp 96.7°F

## 2022-09-19 DIAGNOSIS — Z515 Encounter for palliative care: Secondary | ICD-10-CM

## 2022-09-19 NOTE — Progress Notes (Signed)
PATIENT NAME: Mcdaniel Ohms DOB: Oct 22, 1923 MRN: [id number]  PRIMARY CARE PROVIDER: Mliss Sax, MD  RESPONSIBLE PARTY:  Acct ID - Guarantor Home Phone Work Phone Relationship Acct Type  [id number] JAYLEND, REILAND* [phone]  Self P/F     [address], [place], [place] [address]   Palliative Care Follow Up Encounter Note    Completed home visit.  Lupita Leash caregiver and PCS Keisha from the [place] also present.   Today's Visit:    Respiratory: SOB with exertion and still uses PRN O2   Cardiac: CHF   Cognitive: alert and oriented to self and caregivers; Dementia booklet -  discussed behaviors with Lupita Leash and Deanna Artis   Appetite: still eats 2-3 reduced meals and snacks; drinks when he's thirsty but drinks enough throughout the day d/t caregivers   GI/GU: has BM regularly ever few days and takes Colace every other day   Mobility: uses the walker in and out of the home; today the patient is going to Bible Study at his church   ADLs: depends on Lupita Leash to do ADLs   Sleeping Pattern: still sleeps well throughout the night; does wake up to use the bathroom but can go back to sleep; sleep no less than 8 hrs   Pain: denies pain at this time   Wt: last visit he weighed 160 lbs; last night wt was 147.4 lbs         Goals of Care: To stay in the home with caregivers     CODE STATUS: DNR ADVANCED DIRECTIVES: Y MOST FORM: N PPS: 40%   PHYSICAL EXAM:   VITALS: Today's Vitals   09/19/22 0839  BP: 112/68  Pulse: 87  Temp: (!) 96.7 F (35.9 C)  TempSrc: Temporal  SpO2: 100%  PainSc: 0-No pain    LUNGS: clear to auscultation , decreased breath sounds CARDIAC: regular heart rate EXTREMITIES: limited ROM x4; no longer has fluid seeping through the skin on BLE but still has a black and hard L great toe and pus in the one beside it SKIN: cool, dry, normal turgor  NEURO: negative except for gait problems, memory problems, and weakness       Shay Bartoli Clementeen Graham,  LPN

## 2022-09-24 ENCOUNTER — Encounter: Payer: Self-pay | Admitting: Cardiovascular Disease

## 2022-09-24 ENCOUNTER — Ambulatory Visit
Payer: No Typology Code available for payment source | Attending: Cardiovascular Disease | Admitting: Cardiovascular Disease

## 2022-09-24 VITALS — BP 112/72 | HR 109 | Ht 71.0 in | Wt 152.8 lb

## 2022-09-24 DIAGNOSIS — I10 Essential (primary) hypertension: Secondary | ICD-10-CM

## 2022-09-24 DIAGNOSIS — I5032 Chronic diastolic (congestive) heart failure: Secondary | ICD-10-CM | POA: Diagnosis not present

## 2022-09-24 DIAGNOSIS — I482 Chronic atrial fibrillation, unspecified: Secondary | ICD-10-CM

## 2022-09-24 DIAGNOSIS — I739 Peripheral vascular disease, unspecified: Secondary | ICD-10-CM | POA: Diagnosis not present

## 2022-09-24 NOTE — Progress Notes (Unsigned)
Cardiology Office Note   Date:  09/25/2022   ID:  [name], DOB [birth year], MRN [id number]  PCP:  Mliss Sax, MD  Cardiologist:  Dr. Ladona Ridgel   No chief complaint on file.     History of Present Illness: [name] is a 89 y.o. male who is here today for a follow-up visit regarding peripheral arterial disease.The patient has known history of chronic atrial fibrillation on long-term anticoagulation, symptomatic bradycardia status post permanent pacemaker placement, chronic kidney disease, previous stroke one day after running out of anticoagulation and diet-controlled diabetes. He was seen in 2018 for ulceration on the left distal fourth toe with painful dark discoloration.  Vascular studies showed normal ABI on the right side and mildly reduced on the left side at 0.93. Duplex showed occlusion of the distal left popliteal artery and tibial peroneal trunk. Angiography in March 2018 showed no significant aortoiliac disease: Occluded left popliteal artery with reconstitution in the distal TP trunk with patent posterior tibial and peroneal arteries. The patient  underwent successful complex endovascular intervention of the occluded left popliteal artery and TP trunk using a retrograde access via the left posterior tibial artery.  Most recent vascular studies in February 2022 showed normal ABI bilaterally.  Duplex showed patent stent in the left popliteal and tibioperoneal trunk with no obstructive disease.  He developed dark discoloration involving the left big toe which started about 3 months ago and has not healed.  If anything, this has progressed to the second toe with black discoloration.  He has some discomfort but denies calf claudication.  In addition, he reports numbness in the right big toe. He underwent recent Doppler studies which showed normal ABI on the right and moderately reduced on the left at 0.57.  His ABI was was previously normal on the  left.  Great toe pressure was only 28.   Past Medical History:  Diagnosis Date   Acute CVA (cerebrovascular accident) [facility])    Arthritis    Atrial fibrillation ([name])    CKD (chronic kidney disease)    History of hiatal hernia    Hx SBO    Last 2013 all treated conservatively   Hypertension    Presence of permanent cardiac pacemaker    Prostate cancer ([name])    Stroke ([name]) 2017   left facial drooping noted 08/14/2016   Tachycardia-bradycardia syndrome ([name])    Type II diabetes mellitus ([name])    Ventral hernia    x3    Past Surgical History:  Procedure Laterality Date   ABDOMINAL AORTOGRAM W/LOWER EXTREMITY N/A 08/07/2016   Procedure: Abdominal Aortogram w/Lower Extremity;  Surgeon: Iran Ouch, MD;  Location: [facility] INVASIVE CV LAB;  Service: Cardiovascular;  Laterality: N/A;   CHOLECYSTECTOMY OPEN  03/31/2001   Dr Wiliam Ke   COLON SURGERY     EXPLORATORY LAPAROTOMY  08/2004   Hattie Perch 10/01/2010   HEMORRHOID SURGERY     HERNIA REPAIR     HIATAL HERNIA REPAIR  2002   INCISIONAL HERNIA REPAIR  08/2004   Hattie Perch 10/01/2010   INSERT / REPLACE / REMOVE PACEMAKER     IR THORACENTESIS ASP PLEURAL SPACE W/IMG GUIDE  07/23/2022   IR THORACENTESIS ASP PLEURAL SPACE W/IMG GUIDE  07/24/2022   IR THORACENTESIS ASP PLEURAL SPACE W/IMG GUIDE  07/25/2022   PERIPHERAL VASCULAR BALLOON ANGIOPLASTY  08/07/2016   Procedure: Peripheral Vascular Balloon Angioplasty;  Surgeon: Iran Ouch, MD;  Location: [facility] INVASIVE CV LAB;  Service: Cardiovascular;;  left popliteal artery   PERIPHERAL VASCULAR INTERVENTION  08/14/2016   popliteal artery/notes 08/14/2016   PERIPHERAL VASCULAR INTERVENTION Left 08/14/2016   Procedure: Peripheral Vascular Intervention;  Surgeon: Iran Ouch, MD;  Location: [facility] INVASIVE CV LAB;  Service: Cardiovascular;  Laterality: Left;  popliteal artery   PERMANENT PACEMAKER GENERATOR CHANGE N/A 04/06/2012   Procedure: PERMANENT PACEMAKER GENERATOR CHANGE;  Surgeon: Marinus Maw,  MD;  Location: [facility] CATH LAB;  Service: Cardiovascular;  Laterality: N/A;   PPM GENERATOR CHANGEOUT N/A 02/04/2022   Procedure: PPM GENERATOR CHANGEOUT;  Surgeon: Marinus Maw, MD;  Location: [facility] INVASIVE CV LAB;  Service: Cardiovascular;  Laterality: N/A;   SMALL INTESTINE SURGERY  09/16/2004   SBO resection, VH repair     Current Outpatient Medications  Medication Sig Dispense Refill   apixaban (ELIQUIS) 2.5 MG TABS tablet Take 1 tablet (2.5 mg total) by mouth 2 (two) times daily. 60 tablet 1   atorvastatin (LIPITOR) 10 MG tablet atorvastatin 10 mg tablet TAKE 1 TABLET BY MOUTH ONCE DAILY AT BEDTIME     divalproex (DEPAKOTE SPRINKLE) 125 MG capsule Take 1 capsule (125 mg total) by mouth 2 (two) times daily. 60 capsule 0   docusate sodium (COLACE) 100 MG capsule Take 1 capsule (100 mg total) by mouth daily. 30 capsule 0   finasteride (PROSCAR) 5 MG tablet TAKE 1 TABLET (5 MG TOTAL) BY MOUTH DAILY. 90 tablet 1   furosemide (LASIX) 40 MG tablet Take 1 tablet (40 mg total) by mouth daily. 30 tablet 3   Iron, Ferrous Sulfate, 325 (65 Fe) MG TABS Take 1 tablet every other day. (Patient taking differently: Take 1 tablet by mouth every other day.) 45 tablet 1   latanoprost (XALATAN) 0.005 % ophthalmic solution Place 1 drop into both eyes at bedtime.     loratadine (CLARITIN) 10 MG tablet Take 10 mg by mouth daily as needed for rhinitis.     Melatonin 3 MG TBDP Take 1 tablet by mouth at bedtime. (Patient taking differently: Take 3 mg by mouth at bedtime.) 30 tablet 0   memantine (NAMENDA) 10 MG tablet Take 1 tablet (10 mg total) by mouth 2 (two) times daily. 190 tablet 3   metoprolol succinate (TOPROL-XL) 25 MG 24 hr tablet TAKE 1 TAB EVERY EVENING     midodrine (PROAMATINE) 2.5 MG tablet Take 1 tablet (2.5 mg total) by mouth 3 (three) times daily with meals. 90 tablet 1   pantoprazole (PROTONIX) 40 MG tablet TAKE 1 TABLET(40 MG) BY MOUTH DAILY (Patient taking differently: Take 40 mg by mouth daily.)  90 tablet 1   tamsulosin (FLOMAX) 0.4 MG CAPS capsule Take 1 capsule (0.4 mg total) by mouth daily. 90 capsule 3   No current facility-administered medications for this visit.    Allergies:   Patient has no known allergies.    Social History:  The patient  reports that he quit smoking about 67 years ago. His smoking use included cigarettes. He has a 22.50 pack-year smoking history. He has been exposed to tobacco smoke. He quit smokeless tobacco use about 70 years ago.  His smokeless tobacco use included chew. He reports that he does not drink alcohol and does not use drugs.   Family History:  The patient's family history includes Cancer in his brother; Pneumonia in his father; Stroke in his brother and sister.    ROS:  Please see the history of present illness.   Otherwise, review of systems are positive for none.  All other systems are reviewed and negative.    PHYSICAL EXAM: VS:  BP 112/72   Pulse (!) 109   Ht 5\' 11"  (1.803 m)   Wt 152 lb 12.8 oz (69.3 kg)   BMI 21.31 kg/m  , BMI Body mass index is 21.31 kg/m. GEN: Well nourished, well developed, in no acute distress  HEENT: normal  Neck: no JVD, carotid bruits, or masses Cardiac: Irregularly irregular; no murmurs, rubs, or gallops, mild to moderate bilateral swelling with chronic stasis. Respiratory:  clear to auscultation bilaterally, normal work of breathing GI: soft, nontender, nondistended, + BS MS: no deformity or atrophy  Skin: warm and dry, no rash Neuro:  Strength and sensation are intact Psych: euthymic mood, full affect Vascular: Distal pulses are not palpable.  Gangrenous first and second left toes.  EKG:  EKG is  not ordered today.   Recent Labs: 07/06/2022: TSH 4.549 07/25/2022: B Natriuretic Peptide 535.3 07/29/2022: Magnesium 2.0 08/07/2022: ALT 10; BUN 34; Creatinine, Ser 2.20; Hemoglobin 9.3; Platelets 176; Potassium 4.4; Sodium 139    Lipid Panel    Component Value Date/Time   CHOL 122 07/23/2015  1254   TRIG 55 07/23/2015 1254   HDL 43 07/23/2015 1254   CHOLHDL 2.8 07/23/2015 1254   VLDL 11 07/23/2015 1254   LDLCALC 68 07/23/2015 1254      Wt Readings from Last 3 Encounters:  09/24/22 152 lb 12.8 oz (69.3 kg)  09/11/22 151 lb (68.5 kg)  08/22/22 148 lb (67.1 kg)           No data to display            ASSESSMENT AND PLAN:  1.  Peripheral arterial disease with critical limb ischemia and gangrenous first and second left toes.  I suspect that his popliteal and TP trunk stents are likely occluded given the significant drop in his ABI and cold left foot.   I explained to patient and family members that this is a limb threatening situation and without revascularization is almost certain that this gangrene will progress.  I have suggested proceeding with angiography and possible endovascular intervention.  However, they are concerned about his age and comorbidities including advanced chronic kidney disease which is understandable. They are going to discuss with his power of attorney and will let us know if they agree to proceed with the procedure.  Without revascularization, focus should be on comfort.  2. Chronic atrial fibrillation: He is currently anticoagulated with low-dose Eliquis.  This will have to be held 2 days before the angiogram if sleep seen.  3.  Essential hypertension: Blood pressure is controlled.  4.  Chronic diastolic heart failure: He appears to be euvolemic on current dose of furosemide.  5.  Chronic kidney disease: Most recent labs showed a GFR of 27.   Disposition:   FU with me in 2 months  Signed,  Lorine Bears, MD  09/25/2022 10:31 AM    [facility]

## 2022-09-24 NOTE — Patient Instructions (Signed)
Medication Instructions:  No changes *If you need a refill on your cardiac medications before your next appointment, please call your pharmacy*   Lab Work: None ordered If you have labs (blood work) drawn today and your tests are completely normal, you will receive your results only by: MyChart Message (if you have MyChart) OR A paper copy in the mail If you have any lab test that is abnormal or we need to change your treatment, we will call you to review the results.   Testing/Procedures: None ordered   Follow-Up: At [facility], you and your health needs are our priority.  As part of our continuing mission to provide you with exceptional heart care, we have created designated Provider Care Teams.  These Care Teams include your primary Cardiologist (physician) and Advanced Practice Providers (APPs -  Physician Assistants and Nurse Practitioners) who all work together to provide you with the care you need, when you need it.  We recommend signing up for the patient portal called "MyChart".  Sign up information is provided on this After Visit Summary.  MyChart is used to connect with patients for Virtual Visits (Telemedicine).  Patients are able to view lab/test results, encounter notes, upcoming appointments, etc.  Non-urgent messages can be sent to your provider as well.   To learn more about what you can do with MyChart, go to [email or web address].    Your next appointment:   2 month(s)  Provider:   Dr. Kirke Corin

## 2022-09-26 ENCOUNTER — Ambulatory Visit: Payer: PPO | Admitting: Podiatry

## 2022-09-30 ENCOUNTER — Ambulatory Visit (INDEPENDENT_AMBULATORY_CARE_PROVIDER_SITE_OTHER): Payer: PPO

## 2022-09-30 ENCOUNTER — Telehealth: Payer: No Typology Code available for payment source

## 2022-09-30 DIAGNOSIS — I5032 Chronic diastolic (congestive) heart failure: Secondary | ICD-10-CM

## 2022-09-30 DIAGNOSIS — F03911 Unspecified dementia, unspecified severity, with agitation: Secondary | ICD-10-CM

## 2022-09-30 NOTE — Chronic Care Management (AMB) (Signed)
Chronic Care Management   CCM RN Visit Note  09/30/2022 Name: [name] MRN: [id number] DOB: Oct 04, 1923  Subjective: [name] is a 89 y.o. year old male who is a primary care patient of Mliss Sax, MD. The patient was referred to the Chronic Care Management team for assistance with care management needs subsequent to provider initiation of CCM services and plan of care.    Today's Visit:   Spoke to the patients granddaughter and DPR, Judithann Sauger  for follow up visit.        Goals Addressed             This Visit's Progress    CCM Expected Outcome:  Monitor, Self-Manage and Reduce Symptoms of Heart Failure       Current Barriers:  Care Coordination needs related to resources and social work support  in a patient with heart failure and other chronic conditions Chronic Disease Management support and education needs related to effective management of HF Caregiver support   Planned Interventions: Basic overview and discussion of pathophysiology of Heart Failure reviewed. The patient is having some issues with circulation with his left lower extremity with likely stent occlusion to the procedure he had in 2018. The granddaughter is still consulting with the [place] doctors and discussing what the best course of action is for the patient. Back in 2018 he did not want his leg amputated and likely this is what would have to occur for revascularization. The patients granddaughter does not feel he would do well with them taking part of his leg. She is looking at all options and taking several things into consideration. She feels like it is causing him a lot of pain even though he does not complain about pain.  Provided education on low sodium diet. The patient is getting meals on wheels but often does not like what they bring. His granddaughter cooks for him but sometimes he will not eat. Does like fast food. Education on monitoring for salt and hidden sodium in  his diet. Is eating some but not a lot. Does eat what he enjoys. Education and support given.  Reviewed Heart Failure Action Plan in depth and provided written copy Advised patient to weigh each morning after emptying bladder Reviewed role of diuretics in prevention of fluid overload and management of heart failure Discussed the importance of keeping all appointments with provider. Next appointment with pcp is on 11-07-2022. Saw pcp on 09-11-2022 Provided patient with education about the role of exercise in the management of heart failure Advised patient to discuss changes in fluid balance, heart failure, questions, or concerns with provider Screening for signs and symptoms of depression related to chronic disease state  Assessed social determinant of health barriers  Symptom Management: Take medications as prescribed   Attend all scheduled provider appointments Call provider office for new concerns or questions  call the Suicide and Crisis Lifeline: 988 call the [place] National Suicide Prevention Lifeline: 867-646-8616 or TTY: 906-458-2010 TTY (530)854-8319) to talk to a trained counselor call 1-800-273-TALK (toll free, 24 hour hotline) go to [facility] [address] [place] [phone]) if experiencing a Mental Health or Behavioral Health Crisis  call office if I gain more than 2 pounds in one day or 5 pounds in one week use salt in moderation watch for swelling in feet, ankles and legs every day develop a rescue plan follow rescue plan if symptoms flare-up track symptoms and what helps feel better or  worse dress right for the weather, hot or cold  Follow Up Plan: Telephone follow up appointment with care management team member scheduled for: 10-28-2022 at 315 pm       CCM Expected Outcome:  Monitor, Self-Manage and Reduce Symptoms [id number]       Current Barriers:  Knowledge Deficits related to understanding changes in the patients dementia  and decline with memory Care Coordination needs related to resources to help with managing changes in mentation and mental status  in a patient with dementia  Chronic Disease Management support and education needs related to effective management of Dementia Caregiver support for caring for elderly grandfather with dementia and behavioral changes  Planned Interventions: Evaluation of current treatment plan related to dementia  and patient's adherence to plan as established by provider. Per the patients granddaughter she has seen a decline in his memory and he is repeating himself over and over. Sometimes he is mumbling and its like he is talking to other people. Education on changes that occur of the course of time with patients with dementia. She says sometimes he is clearer in his thinking than other times. He does get aggressive with his speech at times and is still finding ways to not take his medications. They sometimes find the medications in his pockets and other places. His granddaughter thinks the patient has even given some to her dog due to the dogs change in behavior. Reflective listening and support given.  Advised patient to call the office for changes in mood, increased anxiety, depression, or other sx and sx of acute changes in mentation  Provided education to patient re: The book, "The 36 Hour Day", a resource to help patients and family members understand the progression of dementia and how to cope with changes seen. Reviewed medications with patient and discussed compliance. The patients granddaughter states that the patient has been known to take his medications and put them in his pocket. She says he can be sneaky. The granddaughter has spoke to the caregivers and ask the caregivers to make sure he is taking his medications. Sometimes he is compliant sometimes he is not.  Provided patient with dementia  educational materials related to changes seen in patients with dementia and  resources to help with coping with changes seen and the progression of the disease process. Reviewed scheduled/upcoming provider appointments including: 11-07-2022 Social Work referral for support and education for dementia and changes being seen. Has support from the LCSW for ongoing support and education.  Discussed plans with patient for ongoing care management follow up and provided patient with direct contact information for care management team Advised patient to discuss changes she is seeing with the patient, recommendations and support with provider. The patients granddaughter has started writing down things in a book especially when there is a change in behavior. She also talks with the caregivers on a regular basis and gets their input.  Screening for signs and symptoms of depression related to chronic disease state  Assessed social determinant of health barriers  Symptom Management: Take medications as prescribed   Attend all scheduled provider appointments Call provider office for new concerns or questions  call the Suicide and Crisis Lifeline: 988 call the [place] National Suicide Prevention Lifeline: 573-755-2681 or TTY: 620-073-9111 TTY 438-579-2641) to talk to a trained counselor call 1-800-273-TALK (toll free, 24 hour hotline) go to [facility] [address] [place] [phone]) if experiencing a Mental Health or Behavioral Health Crisis   Follow  Up Plan: Telephone follow up appointment with care management team member scheduled for: 10-28-2022 at 315 pm          Plan:Telephone follow up appointment with care management team member scheduled for:  10-28-2022 at 315 pm  Alto Denver RN, MSN, CCM RN Care Manager  Chronic Care Management Direct Number: [phone]

## 2022-09-30 NOTE — Patient Instructions (Signed)
Please call the care guide team at [phone] if you need to cancel or reschedule your appointment.   If you are experiencing a Mental Health or Behavioral Health Crisis or need someone to talk to, please call the Suicide and Crisis Lifeline: 988 call the [place] National Suicide Prevention Lifeline: 817-462-5121 or TTY: 928-503-4856 TTY (904)256-8624) to talk to a trained counselor call 1-800-273-TALK (toll free, 24 hour hotline) go to [facility] [address] [place] [phone])   Following is a copy of the CCM Program Consent:  CCM service includes personalized support from designated clinical staff supervised by the physician, including individualized plan of care and coordination with other care providers 24/7 contact phone numbers for assistance for urgent and routine care needs. Service will only be billed when office clinical staff spend 20 minutes or more in a month to coordinate care. Only one practitioner may furnish and bill the service in a calendar month. The patient may stop CCM services at amy time (effective at the end of the month) by phone call to the office staff. The patient will be responsible for cost sharing (co-pay) or up to 20% of the service fee (after annual deductible is met)  Following is a copy of your full provider care plan:   Goals Addressed             This Visit's Progress    CCM Expected Outcome:  Monitor, Self-Manage and Reduce Symptoms of Heart Failure       Current Barriers:  Care Coordination needs related to resources and social work support  in a patient with heart failure and other chronic conditions Chronic Disease Management support and education needs related to effective management of HF Caregiver support   Planned Interventions: Basic overview and discussion of pathophysiology of Heart Failure reviewed. The patient is having some issues with circulation with his left lower extremity with  likely stent occlusion to the procedure he had in 2018. The granddaughter is still consulting with the [place] doctors and discussing what the best course of action is for the patient. Back in 2018 he did not want his leg amputated and likely this is what would have to occur for revascularization. The patients granddaughter does not feel he would do well with them taking part of his leg. She is looking at all options and taking several things into consideration. She feels like it is causing him a lot of pain even though he does not complain about pain.  Provided education on low sodium diet. The patient is getting meals on wheels but often does not like what they bring. His granddaughter cooks for him but sometimes he will not eat. Does like fast food. Education on monitoring for salt and hidden sodium in his diet. Is eating some but not a lot. Does eat what he enjoys. Education and support given.  Reviewed Heart Failure Action Plan in depth and provided written copy Advised patient to weigh each morning after emptying bladder Reviewed role of diuretics in prevention of fluid overload and management of heart failure Discussed the importance of keeping all appointments with provider. Next appointment with pcp is on 11-07-2022. Saw pcp on 09-11-2022 Provided patient with education about the role of exercise in the management of heart failure Advised patient to discuss changes in fluid balance, heart failure, questions, or concerns with provider Screening for signs and symptoms of depression related to chronic disease state  Assessed social determinant of health barriers  Symptom Management: Take  medications as prescribed   Attend all scheduled provider appointments Call provider office for new concerns or questions  call the Suicide and Crisis Lifeline: 988 call the [place] National Suicide Prevention Lifeline: 779-808-4924 or TTY: 512-498-2562 TTY 262-672-9175) to talk to a trained counselor call  1-800-273-TALK (toll free, 24 hour hotline) go to [facility] [address], [place] [phone]) if experiencing a Mental Health or Behavioral Health Crisis  call office if I gain more than 2 pounds in one day or 5 pounds in one week use salt in moderation watch for swelling in feet, ankles and legs every day develop a rescue plan follow rescue plan if symptoms flare-up track symptoms and what helps feel better or worse dress right for the weather, hot or cold  Follow Up Plan: Telephone follow up appointment with care management team member scheduled for: 10-28-2022 at 315 pm       CCM Expected Outcome:  Monitor, Self-Manage and Reduce Symptoms [id number]       Current Barriers:  Knowledge Deficits related to understanding changes in the patients dementia and decline with memory Care Coordination needs related to resources to help with managing changes in mentation and mental status  in a patient with dementia  Chronic Disease Management support and education needs related to effective management of Dementia Caregiver support for caring for elderly grandfather with dementia and behavioral changes  Planned Interventions: Evaluation of current treatment plan related to dementia  and patient's adherence to plan as established by provider. Per the patients granddaughter she has seen a decline in his memory and he is repeating himself over and over. Sometimes he is mumbling and its like he is talking to other people. Education on changes that occur of the course of time with patients with dementia. She says sometimes he is clearer in his thinking than other times. He does get aggressive with his speech at times and is still finding ways to not take his medications. They sometimes find the medications in his pockets and other places. His granddaughter thinks the patient has even given some to her dog due to the dogs change in behavior. Reflective listening  and support given.  Advised patient to call the office for changes in mood, increased anxiety, depression, or other sx and sx of acute changes in mentation  Provided education to patient re: The book, "The 36 Hour Day", a resource to help patients and family members understand the progression of dementia and how to cope with changes seen. Reviewed medications with patient and discussed compliance. The patients granddaughter states that the patient has been known to take his medications and put them in his pocket. She says he can be sneaky. The granddaughter has spoke to the caregivers and ask the caregivers to make sure he is taking his medications. Sometimes he is compliant sometimes he is not.  Provided patient with dementia  educational materials related to changes seen in patients with dementia and resources to help with coping with changes seen and the progression of the disease process. Reviewed scheduled/upcoming provider appointments including: 11-07-2022 Social Work referral for support and education for dementia and changes being seen. Has support from the LCSW for ongoing support and education.  Discussed plans with patient for ongoing care management follow up and provided patient with direct contact information for care management team Advised patient to discuss changes she is seeing with the patient, recommendations and support with provider. The patients granddaughter has started writing down things in  a book especially when there is a change in behavior. She also talks with the caregivers on a regular basis and gets their input.  Screening for signs and symptoms of depression related to chronic disease state  Assessed social determinant of health barriers  Symptom Management: Take medications as prescribed   Attend all scheduled provider appointments Call provider office for new concerns or questions  call the Suicide and Crisis Lifeline: 988 call the [place] National Suicide Prevention  Lifeline: 561-345-3905 or TTY: 2028146304 TTY (980) 304-0432) to talk to a trained counselor call 1-800-273-TALK (toll free, 24 hour hotline) go to [facility] [address] [place] [phone]) if experiencing a Mental Health or Behavioral Health Crisis   Follow Up Plan: Telephone follow up appointment with care management team member scheduled for: 10-28-2022 at 315 pm          Patient verbalizes understanding of instructions and care plan provided today and agrees to view in MyChart. Active MyChart status and patient understanding of how to access instructions and care plan via MyChart confirmed with patient.  Telephone follow up appointment with care management team member scheduled for: 10-28-2022 at 315 pm

## 2022-10-01 ENCOUNTER — Ambulatory Visit: Payer: No Typology Code available for payment source | Admitting: Cardiovascular Disease

## 2022-10-07 ENCOUNTER — Encounter: Payer: Self-pay | Admitting: Cardiovascular Disease

## 2022-10-07 ENCOUNTER — Encounter (HOSPITAL_COMMUNITY): Payer: Self-pay

## 2022-10-07 ENCOUNTER — Inpatient Hospital Stay (HOSPITAL_COMMUNITY)
Admission: EM | Admit: 2022-10-07 | Discharge: 2022-10-16 | DRG: 291 | Disposition: A | Discharge status: Home Health | Payer: No Typology Code available for payment source | Attending: Internal Medicine | Admitting: Internal Medicine

## 2022-10-07 ENCOUNTER — Other Ambulatory Visit: Payer: Self-pay

## 2022-10-07 ENCOUNTER — Emergency Department (HOSPITAL_COMMUNITY): Payer: No Typology Code available for payment source

## 2022-10-07 ENCOUNTER — Inpatient Hospital Stay (HOSPITAL_COMMUNITY): Payer: No Typology Code available for payment source

## 2022-10-07 ENCOUNTER — Ambulatory Visit (INDEPENDENT_AMBULATORY_CARE_PROVIDER_SITE_OTHER): Payer: No Typology Code available for payment source

## 2022-10-07 DIAGNOSIS — D696 Thrombocytopenia, unspecified: Secondary | ICD-10-CM | POA: Diagnosis present

## 2022-10-07 DIAGNOSIS — I4821 Permanent atrial fibrillation: Secondary | ICD-10-CM | POA: Diagnosis present

## 2022-10-07 DIAGNOSIS — I471 Supraventricular tachycardia, unspecified: Secondary | ICD-10-CM | POA: Diagnosis present

## 2022-10-07 DIAGNOSIS — D631 Anemia in chronic kidney disease: Secondary | ICD-10-CM | POA: Diagnosis present

## 2022-10-07 DIAGNOSIS — I495 Sick sinus syndrome: Secondary | ICD-10-CM

## 2022-10-07 DIAGNOSIS — N184 Chronic kidney disease, stage 4 (severe): Secondary | ICD-10-CM | POA: Diagnosis present

## 2022-10-07 DIAGNOSIS — I959 Hypotension, unspecified: Secondary | ICD-10-CM | POA: Diagnosis present

## 2022-10-07 DIAGNOSIS — I130 Hypertensive heart and chronic kidney disease with heart failure and stage 1 through stage 4 chronic kidney disease, or unspecified chronic kidney disease: Principal | ICD-10-CM | POA: Diagnosis present

## 2022-10-07 DIAGNOSIS — N1832 Chronic kidney disease, stage 3b: Secondary | ICD-10-CM | POA: Diagnosis not present

## 2022-10-07 DIAGNOSIS — Z950 Presence of cardiac pacemaker: Secondary | ICD-10-CM | POA: Diagnosis not present

## 2022-10-07 DIAGNOSIS — I314 Cardiac tamponade: Secondary | ICD-10-CM | POA: Diagnosis present

## 2022-10-07 DIAGNOSIS — N400 Enlarged prostate without lower urinary tract symptoms: Secondary | ICD-10-CM | POA: Diagnosis present

## 2022-10-07 DIAGNOSIS — Z66 Do not resuscitate: Secondary | ICD-10-CM | POA: Diagnosis present

## 2022-10-07 DIAGNOSIS — Z8546 Personal history of malignant neoplasm of prostate: Secondary | ICD-10-CM

## 2022-10-07 DIAGNOSIS — Z8673 Personal history of transient ischemic attack (TIA), and cerebral infarction without residual deficits: Secondary | ICD-10-CM | POA: Diagnosis not present

## 2022-10-07 DIAGNOSIS — K5900 Constipation, unspecified: Secondary | ICD-10-CM | POA: Diagnosis present

## 2022-10-07 DIAGNOSIS — Z1152 Encounter for screening for COVID-19: Secondary | ICD-10-CM

## 2022-10-07 DIAGNOSIS — E1152 Type 2 diabetes mellitus with diabetic peripheral angiopathy with gangrene: Secondary | ICD-10-CM | POA: Diagnosis present

## 2022-10-07 DIAGNOSIS — I472 Ventricular tachycardia, unspecified: Secondary | ICD-10-CM | POA: Diagnosis present

## 2022-10-07 DIAGNOSIS — I69392 Facial weakness following cerebral infarction: Secondary | ICD-10-CM | POA: Diagnosis not present

## 2022-10-07 DIAGNOSIS — E119 Type 2 diabetes mellitus without complications: Secondary | ICD-10-CM

## 2022-10-07 DIAGNOSIS — G309 Alzheimer's disease, unspecified: Secondary | ICD-10-CM | POA: Diagnosis present

## 2022-10-07 DIAGNOSIS — I739 Peripheral vascular disease, unspecified: Secondary | ICD-10-CM | POA: Diagnosis not present

## 2022-10-07 DIAGNOSIS — Z87891 Personal history of nicotine dependence: Secondary | ICD-10-CM

## 2022-10-07 DIAGNOSIS — J90 Pleural effusion, not elsewhere classified: Secondary | ICD-10-CM | POA: Diagnosis not present

## 2022-10-07 DIAGNOSIS — R0609 Other forms of dyspnea: Principal | ICD-10-CM

## 2022-10-07 DIAGNOSIS — K439 Ventral hernia without obstruction or gangrene: Secondary | ICD-10-CM | POA: Diagnosis present

## 2022-10-07 DIAGNOSIS — F0280 Dementia in other diseases classified elsewhere without behavioral disturbance: Secondary | ICD-10-CM | POA: Diagnosis present

## 2022-10-07 DIAGNOSIS — I3139 Other pericardial effusion (noninflammatory): Secondary | ICD-10-CM | POA: Diagnosis present

## 2022-10-07 DIAGNOSIS — Z823 Family history of stroke: Secondary | ICD-10-CM

## 2022-10-07 DIAGNOSIS — I70262 Atherosclerosis of native arteries of extremities with gangrene, left leg: Secondary | ICD-10-CM | POA: Diagnosis present

## 2022-10-07 DIAGNOSIS — R0602 Shortness of breath: Secondary | ICD-10-CM

## 2022-10-07 DIAGNOSIS — I509 Heart failure, unspecified: Secondary | ICD-10-CM | POA: Insufficient documentation

## 2022-10-07 DIAGNOSIS — I96 Gangrene, not elsewhere classified: Secondary | ICD-10-CM | POA: Diagnosis present

## 2022-10-07 DIAGNOSIS — K219 Gastro-esophageal reflux disease without esophagitis: Secondary | ICD-10-CM | POA: Diagnosis present

## 2022-10-07 DIAGNOSIS — Z993 Dependence on wheelchair: Secondary | ICD-10-CM

## 2022-10-07 DIAGNOSIS — R5383 Other fatigue: Secondary | ICD-10-CM | POA: Diagnosis not present

## 2022-10-07 DIAGNOSIS — R54 Age-related physical debility: Secondary | ICD-10-CM | POA: Diagnosis present

## 2022-10-07 DIAGNOSIS — I5033 Acute on chronic diastolic (congestive) heart failure: Secondary | ICD-10-CM | POA: Diagnosis present

## 2022-10-07 DIAGNOSIS — I4891 Unspecified atrial fibrillation: Secondary | ICD-10-CM | POA: Diagnosis not present

## 2022-10-07 DIAGNOSIS — I480 Paroxysmal atrial fibrillation: Secondary | ICD-10-CM | POA: Diagnosis present

## 2022-10-07 DIAGNOSIS — I10 Essential (primary) hypertension: Secondary | ICD-10-CM | POA: Diagnosis not present

## 2022-10-07 DIAGNOSIS — N401 Enlarged prostate with lower urinary tract symptoms: Secondary | ICD-10-CM | POA: Diagnosis present

## 2022-10-07 DIAGNOSIS — Z7901 Long term (current) use of anticoagulants: Secondary | ICD-10-CM

## 2022-10-07 DIAGNOSIS — E1122 Type 2 diabetes mellitus with diabetic chronic kidney disease: Secondary | ICD-10-CM | POA: Diagnosis present

## 2022-10-07 DIAGNOSIS — Z79899 Other long term (current) drug therapy: Secondary | ICD-10-CM

## 2022-10-07 DIAGNOSIS — N138 Other obstructive and reflux uropathy: Secondary | ICD-10-CM | POA: Diagnosis present

## 2022-10-07 LAB — CBC WITH DIFFERENTIAL/PLATELET
Abs Immature Granulocytes: 0.03 10*3/uL (ref 0.00–0.07)
Basophils Absolute: 0 10*3/uL (ref 0.0–0.1)
Basophils Relative: 1 %
Eosinophils Absolute: 0 10*3/uL (ref 0.0–0.5)
Eosinophils Relative: 1 %
HCT: 29.8 % — ABNORMAL LOW (ref 39.0–52.0)
Hemoglobin: 9.6 g/dL — ABNORMAL LOW (ref 13.0–17.0)
Immature Granulocytes: 0 %
Lymphocytes Relative: 11 %
Lymphs Abs: 0.8 10*3/uL (ref 0.7–4.0)
MCH: 25.5 pg — ABNORMAL LOW (ref 26.0–34.0)
MCHC: 32.2 g/dL (ref 30.0–36.0)
MCV: 79 fL — ABNORMAL LOW (ref 80.0–100.0)
Monocytes Absolute: 0.8 10*3/uL (ref 0.1–1.0)
Monocytes Relative: 12 %
Neutro Abs: 5.3 10*3/uL (ref 1.7–7.7)
Neutrophils Relative %: 75 %
Platelets: 162 10*3/uL (ref 150–400)
RBC: 3.77 MIL/uL — ABNORMAL LOW (ref 4.22–5.81)
RDW: 17.9 % — ABNORMAL HIGH (ref 11.5–15.5)
WBC: 7 10*3/uL (ref 4.0–10.5)
nRBC: 0 % (ref 0.0–0.2)

## 2022-10-07 LAB — COMPREHENSIVE METABOLIC PANEL
ALT: 9 U/L (ref 0–44)
AST: 15 U/L (ref 15–41)
Albumin: 2.9 g/dL — ABNORMAL LOW (ref 3.5–5.0)
Alkaline Phosphatase: 68 U/L (ref 38–126)
Anion gap: 10 (ref 5–15)
BUN: 27 mg/dL — ABNORMAL HIGH (ref 8–23)
CO2: 25 mmol/L (ref 22–32)
Calcium: 8 mg/dL — ABNORMAL LOW (ref 8.9–10.3)
Chloride: 104 mmol/L (ref 98–111)
Creatinine, Ser: 2.3 mg/dL — ABNORMAL HIGH (ref 0.61–1.24)
GFR, Estimated: 25 mL/min — ABNORMAL LOW (ref >60)
Glucose, Bld: 177 mg/dL — ABNORMAL HIGH (ref 70–99)
Potassium: 4.4 mmol/L (ref 3.5–5.1)
Sodium: 139 mmol/L (ref 135–145)
Total Bilirubin: 0.4 mg/dL (ref 0.3–1.2)
Total Protein: 6.2 g/dL — ABNORMAL LOW (ref 6.5–8.1)

## 2022-10-07 LAB — I-STAT CHEM 8, ED
BUN: 28 mg/dL — ABNORMAL HIGH (ref 8–23)
Calcium, Ion: 1.11 mmol/L — ABNORMAL LOW (ref 1.15–1.40)
Chloride: 103 mmol/L (ref 98–111)
Creatinine, Ser: 2.3 mg/dL — ABNORMAL HIGH (ref 0.61–1.24)
Glucose, Bld: 175 mg/dL — ABNORMAL HIGH (ref 70–99)
HCT: 33 % — ABNORMAL LOW (ref 39.0–52.0)
Hemoglobin: 11.2 g/dL — ABNORMAL LOW (ref 13.0–17.0)
Potassium: 4.4 mmol/L (ref 3.5–5.1)
Sodium: 142 mmol/L (ref 135–145)
TCO2: 27 mmol/L (ref 22–32)

## 2022-10-07 LAB — CUP PACEART REMOTE DEVICE CHECK
Battery Remaining Longevity: 164 mo
Battery Voltage: 3.05 V
Brady Statistic RV Percent Paced: 7.39 %
Date Time Interrogation Session: 20240520072015
Implantable Lead Connection Status: 753985
Implantable Lead Implant Date: 20060510
Implantable Lead Location: 753860
Implantable Lead Model: 5076
Implantable Pulse Generator Implant Date: 20230918
Lead Channel Impedance Value: 247 Ohm
Lead Channel Impedance Value: 342 Ohm
Lead Channel Pacing Threshold Amplitude: 0.875 V
Lead Channel Pacing Threshold Pulse Width: 0.4 ms
Lead Channel Sensing Intrinsic Amplitude: 14.125 mV
Lead Channel Sensing Intrinsic Amplitude: 14.125 mV
Lead Channel Setting Pacing Amplitude: 2.25 V
Lead Channel Setting Pacing Pulse Width: 0.4 ms
Lead Channel Setting Sensing Sensitivity: 1.2 mV
Zone Setting Status: 755011

## 2022-10-07 LAB — ECHOCARDIOGRAM COMPLETE
AR max vel: 2.6 cm2
AV Peak grad: 2.6 mmHg
Ao pk vel: 0.8 m/s
Area-P 1/2: 6.54 cm2
S' Lateral: 2.7 cm
Weight: 2444.46 oz

## 2022-10-07 LAB — CBG MONITORING, ED
Glucose-Capillary: 89 mg/dL (ref 70–99)
Glucose-Capillary: 91 mg/dL (ref 70–99)

## 2022-10-07 LAB — RESP PANEL BY RT-PCR (RSV, FLU A&B, COVID)  RVPGX2
Influenza A by PCR: NEGATIVE
Influenza B by PCR: NEGATIVE
Resp Syncytial Virus by PCR: NEGATIVE
SARS Coronavirus 2 by RT PCR: NEGATIVE

## 2022-10-07 LAB — GLUCOSE, CAPILLARY: Glucose-Capillary: 169 mg/dL — ABNORMAL HIGH (ref 70–99)

## 2022-10-07 LAB — TROPONIN I (HIGH SENSITIVITY)
Troponin I (High Sensitivity): 26 ng/L — ABNORMAL HIGH (ref <18)
Troponin I (High Sensitivity): 27 ng/L — ABNORMAL HIGH (ref <18)

## 2022-10-07 LAB — MAGNESIUM: Magnesium: 1.8 mg/dL (ref 1.7–2.4)

## 2022-10-07 LAB — BRAIN NATRIURETIC PEPTIDE: B Natriuretic Peptide: 352 pg/mL — ABNORMAL HIGH (ref 0.0–100.0)

## 2022-10-07 MED ORDER — HEPARIN (PORCINE) 25000 UT/250ML-% IV SOLN
950.0000 [IU]/h | INTRAVENOUS | Status: DC
  Administered 2022-10-07: 950 [IU]/h via INTRAVENOUS
  Filled 2022-10-07: qty 250

## 2022-10-07 MED ORDER — DIVALPROEX SODIUM 125 MG PO CSDR
125.0000 mg | DELAYED_RELEASE_CAPSULE | Freq: Two times a day (BID) | ORAL | Status: DC
  Administered 2022-10-07 – 2022-10-16 (×18): 125 mg via ORAL
  Filled 2022-10-07 (×19): qty 1

## 2022-10-07 MED ORDER — ORAL CARE MOUTH RINSE: 15.0000 mL | OROMUCOSAL | Status: DC | PRN

## 2022-10-07 MED ORDER — PANTOPRAZOLE SODIUM 40 MG PO TBEC
40.0000 mg | DELAYED_RELEASE_TABLET | Freq: Every day | ORAL | Status: DC
  Administered 2022-10-08 – 2022-10-16 (×9): 40 mg via ORAL
  Filled 2022-10-07 (×9): qty 1

## 2022-10-07 MED ORDER — FINASTERIDE 5 MG PO TABS
5.0000 mg | ORAL_TABLET | Freq: Every day | ORAL | Status: DC
  Administered 2022-10-08 – 2022-10-16 (×9): 5 mg via ORAL
  Filled 2022-10-07 (×9): qty 1

## 2022-10-07 MED ORDER — FUROSEMIDE 10 MG/ML IJ SOLN
40.0000 mg | Freq: Every day | INTRAMUSCULAR | Status: DC
  Administered 2022-10-08 – 2022-10-10 (×3): 40 mg via INTRAVENOUS
  Filled 2022-10-07 (×3): qty 4

## 2022-10-07 MED ORDER — ALBUTEROL SULFATE (2.5 MG/3ML) 0.083% IN NEBU
2.5000 mg | INHALATION_SOLUTION | Freq: Four times a day (QID) | RESPIRATORY_TRACT | Status: DC | PRN
  Administered 2022-10-08: 2.5 mg via RESPIRATORY_TRACT
  Filled 2022-10-07: qty 3

## 2022-10-07 MED ORDER — ENOXAPARIN SODIUM 30 MG/0.3ML IJ SOSY
30.0000 mg | PREFILLED_SYRINGE | Freq: Every day | INTRAMUSCULAR | Status: DC
  Administered 2022-10-07: 30 mg via SUBCUTANEOUS
  Filled 2022-10-07: qty 0.3

## 2022-10-07 MED ORDER — MEMANTINE HCL 10 MG PO TABS
10.0000 mg | ORAL_TABLET | Freq: Two times a day (BID) | ORAL | Status: DC
  Administered 2022-10-07 – 2022-10-16 (×18): 10 mg via ORAL
  Filled 2022-10-07 (×19): qty 1

## 2022-10-07 MED ORDER — SODIUM CHLORIDE 0.9% FLUSH
3.0000 mL | Freq: Two times a day (BID) | INTRAVENOUS | Status: DC
  Administered 2022-10-08 – 2022-10-16 (×13): 3 mL via INTRAVENOUS

## 2022-10-07 MED ORDER — LATANOPROST 0.005 % OP SOLN
1.0000 [drp] | Freq: Every day | OPHTHALMIC | Status: DC
  Administered 2022-10-07 – 2022-10-15 (×9): 1 [drp] via OPHTHALMIC
  Filled 2022-10-07 (×2): qty 2.5

## 2022-10-07 MED ORDER — MELATONIN 3 MG PO TABS
3.0000 mg | ORAL_TABLET | Freq: Every day | ORAL | Status: DC
  Administered 2022-10-07 – 2022-10-15 (×9): 3 mg via ORAL
  Filled 2022-10-07 (×9): qty 1

## 2022-10-07 MED ORDER — INSULIN ASPART 100 UNIT/ML IJ SOLN: 0.0000 [IU] | Freq: Three times a day (TID) | INTRAMUSCULAR | Status: DC

## 2022-10-07 MED ORDER — ACETAMINOPHEN 650 MG RE SUPP: 650.0000 mg | Freq: Four times a day (QID) | RECTAL | Status: DC | PRN

## 2022-10-07 MED ORDER — FUROSEMIDE 10 MG/ML IJ SOLN
40.0000 mg | Freq: Once | INTRAMUSCULAR | Status: AC
  Administered 2022-10-07: 40 mg via INTRAVENOUS
  Filled 2022-10-07: qty 4

## 2022-10-07 MED ORDER — ACETAMINOPHEN 325 MG PO TABS
650.0000 mg | ORAL_TABLET | Freq: Four times a day (QID) | ORAL | Status: DC | PRN
  Administered 2022-10-09 – 2022-10-14 (×3): 650 mg via ORAL
  Filled 2022-10-07 (×3): qty 2

## 2022-10-07 MED ORDER — DOCUSATE SODIUM 100 MG PO CAPS
100.0000 mg | ORAL_CAPSULE | Freq: Every day | ORAL | Status: DC
  Administered 2022-10-08 – 2022-10-13 (×6): 100 mg via ORAL
  Filled 2022-10-07 (×6): qty 1

## 2022-10-07 MED ORDER — PERFLUTREN LIPID MICROSPHERE
1.0000 mL | INTRAVENOUS | Status: AC | PRN
  Administered 2022-10-07: 6 mL via INTRAVENOUS

## 2022-10-07 MED ORDER — TAMSULOSIN HCL 0.4 MG PO CAPS
0.4000 mg | ORAL_CAPSULE | Freq: Every day | ORAL | Status: DC
  Administered 2022-10-08 – 2022-10-16 (×9): 0.4 mg via ORAL
  Filled 2022-10-07 (×9): qty 1

## 2022-10-07 MED ORDER — MIDODRINE HCL 5 MG PO TABS
2.5000 mg | ORAL_TABLET | Freq: Three times a day (TID) | ORAL | Status: DC
  Administered 2022-10-07 – 2022-10-10 (×10): 2.5 mg via ORAL
  Filled 2022-10-07 (×10): qty 1

## 2022-10-07 NOTE — H&P (Signed)
History and Physical    Patient: [name]:[id number] DOB: [birth year] DOA: 10/07/2022 DOS: the patient was seen and examined on 10/07/2022 PCP: Mliss Sax, MD  Patient coming from: Home  Chief Complaint:  Chief Complaint  Patient presents with   Shortness of Breath   HPI: [name] is a 89 y.o. male with medical history significant of hypertension, heart failure with preserved EF, atrial fibrillation, tachybradycardia s/p pacemaker, CVA, peripheral arterial disease, CKD stage IV, diabetes mellitus type 2 who presents with complaints of shortness of breath.  At baseline patient ambulates with use of a rollator or wheelchair and family reports that he has 24-hour care.  Over the last 3 days patient had reported having progressively worsening shortness of breath mostly with exertion, but sometimes at rest.  Patient denies any significant cough, fever, nausea, vomiting, or diarrhea symptoms.  He chronically has sputum production and grandson-in-law notes that sometimes the patient seems to get choked up with eating.  Patient had been in it back in March of this year due to pleural effusions requiring thoracentesis for which fluid was noted to be transudative with no malignant cells seen on cytology.  Family makes note that the patient also has a wound of his left great toe that is been present for several weeks now.  He has been followed by Dr. Lilian Kapur with podiatry in the outpatient setting.  Patient underwent ABIs with TBI's which noted resting left ankle-brachial index indicating moderate left lower extremity arterial disease with the left toe brachial index noted to be abnormal.  He was sent to Dr. Samuella Cota for peripheral artery disease with critical limb ischemia and gangrenous first toe.  Revascularization was recommended, but family was worried in regards to patient's age and comorbidities.  In the emergency department patient was noted to be afebrile with  respirations 12-23, and all other vital signs maintained.  Labs significant for WBC 7, hemoglobin 9.6, BUN 28, creatinine 2.3, ionized calcium 1.11, and BNP 352.  Chest x-ray noting increasing pleural effusions moderate to large on the left and small right effusion with adjacent opacity.  Patient has been given Lasix 40 mg IV x 1 dose.  Cardiology had been formally consulted.  Review of Systems: As mentioned in the history of present illness. All other systems reviewed and are negative. Past Medical History:  Diagnosis Date   Acute CVA (cerebrovascular accident) [facility]    Arthritis    Atrial fibrillation ([name])    CKD (chronic kidney disease)    History of hiatal hernia    Hx SBO    Last 2013 all treated conservatively   Hypertension    Presence of permanent cardiac pacemaker    Prostate cancer ([name])    Stroke ([name]) 2017   left facial drooping noted 08/14/2016   Tachycardia-bradycardia syndrome ([name])    Type II diabetes mellitus ([name])    Ventral hernia    x3   Past Surgical History:  Procedure Laterality Date   ABDOMINAL AORTOGRAM W/LOWER EXTREMITY N/A 08/07/2016   Procedure: Abdominal Aortogram w/Lower Extremity;  Surgeon: Iran Ouch, MD;  Location: [facility] INVASIVE CV LAB;  Service: Cardiovascular;  Laterality: N/A;   CHOLECYSTECTOMY OPEN  03/31/2001   Dr Wiliam Ke   COLON SURGERY     EXPLORATORY LAPAROTOMY  08/2004   Hattie Perch 10/01/2010   HEMORRHOID SURGERY     HERNIA REPAIR     HIATAL HERNIA REPAIR  2002   INCISIONAL HERNIA REPAIR  08/2004   Hattie Perch 10/01/2010  INSERT / REPLACE / REMOVE PACEMAKER     IR THORACENTESIS ASP PLEURAL SPACE W/IMG GUIDE  07/23/2022   IR THORACENTESIS ASP PLEURAL SPACE W/IMG GUIDE  07/24/2022   IR THORACENTESIS ASP PLEURAL SPACE W/IMG GUIDE  07/25/2022   PERIPHERAL VASCULAR BALLOON ANGIOPLASTY  08/07/2016   Procedure: Peripheral Vascular Balloon Angioplasty;  Surgeon: Iran Ouch, MD;  Location: [facility] INVASIVE CV LAB;  Service: Cardiovascular;;  left popliteal  artery   PERIPHERAL VASCULAR INTERVENTION  08/14/2016   popliteal artery/notes 08/14/2016   PERIPHERAL VASCULAR INTERVENTION Left 08/14/2016   Procedure: Peripheral Vascular Intervention;  Surgeon: Iran Ouch, MD;  Location: [facility] INVASIVE CV LAB;  Service: Cardiovascular;  Laterality: Left;  popliteal artery   PERMANENT PACEMAKER GENERATOR CHANGE N/A 04/06/2012   Procedure: PERMANENT PACEMAKER GENERATOR CHANGE;  Surgeon: Marinus Maw, MD;  Location: [facility] CATH LAB;  Service: Cardiovascular;  Laterality: N/A;   PPM GENERATOR CHANGEOUT N/A 02/04/2022   Procedure: PPM GENERATOR CHANGEOUT;  Surgeon: Marinus Maw, MD;  Location[facility] INVASIVE CV LAB;  Service: Cardiovascular;  Laterality: N/A;   SMALL INTESTINE SURGERY  09/16/2004   SBO resection, VH repair   Social History:  reports that he quit smoking about 67 years ago. His smoking use included cigarettes. He has a 22.50 pack-year smoking history. He has been exposed to tobacco smoke. He quit smokeless tobacco use about 70 years ago.  His smokeless tobacco use included chew. He reports that he does not drink alcohol and does not use drugs.  No Known Allergies  Family History  Problem Relation Age of Onset   Pneumonia Father    Stroke Brother    Stroke Sister    Cancer Brother        type unknown    Prior to Admission medications   Medication Sig Start Date End Date Taking? Authorizing Provider  apixaban (ELIQUIS) 2.5 MG TABS tablet Take 1 tablet (2.5 mg total) by mouth 2 (two) times daily. 07/29/22   Azucena Fallen, MD  atorvastatin (LIPITOR) 10 MG tablet atorvastatin 10 mg tablet TAKE 1 TABLET BY MOUTH ONCE DAILY AT BEDTIME 05/09/20   [provider]  divalproex (DEPAKOTE SPRINKLE) 125 MG capsule Take 1 capsule (125 mg total) by mouth 2 (two) times daily. 05/27/22 07/22/23  Mliss Sax, MD  docusate sodium (COLACE) 100 MG capsule Take 1 capsule (100 mg total) by mouth daily. 07/10/22   Amin, Ankit Chirag, MD   finasteride (PROSCAR) 5 MG tablet TAKE 1 TABLET (5 MG TOTAL) BY MOUTH DAILY. 06/07/22   Mliss Sax, MD  furosemide (LASIX) 40 MG tablet Take 1 tablet (40 mg total) by mouth daily. 07/30/22   Azucena Fallen, MD  Iron, Ferrous Sulfate, 325 (65 Fe) MG TABS Take 1 tablet every other day. Patient taking differently: Take 1 tablet by mouth every other day. 06/13/22   Mliss Sax, MD  latanoprost (XALATAN) 0.005 % ophthalmic solution Place 1 drop into both eyes at bedtime. 05/03/20   [provider]  loratadine (CLARITIN) 10 MG tablet Take 10 mg by mouth daily as needed for rhinitis.    [provider]  Melatonin 3 MG TBDP Take 1 tablet by mouth at bedtime. Patient taking differently: Take 3 mg by mouth at bedtime. 07/10/22   Amin, Loura Halt, MD  memantine (NAMENDA) 10 MG tablet Take 1 tablet (10 mg total) by mouth 2 (two) times daily. 08/22/22   Micki Riley, MD  metoprolol succinate (TOPROL-XL) 25 MG  24 hr tablet TAKE 1 TAB EVERY EVENING 11/25/21   [provider]  midodrine (PROAMATINE) 2.5 MG tablet Take 1 tablet (2.5 mg total) by mouth 3 (three) times daily with meals. 07/29/22   Azucena Fallen, MD  pantoprazole (PROTONIX) 40 MG tablet TAKE 1 TABLET(40 MG) BY MOUTH DAILY Patient taking differently: Take 40 mg by mouth daily. 01/01/22   Mliss Sax, MD  tamsulosin (FLOMAX) 0.4 MG CAPS capsule Take 1 capsule (0.4 mg total) by mouth daily. 12/04/21   Mliss Sax, MD    Physical Exam: Vitals:   10/07/22 1115 10/07/22 1130 10/07/22 1145 10/07/22 1200  BP: (!) 117/93 123/88 131/81 123/87  Pulse:   88   Resp: 16 12 19 20   Temp:      TempSrc:      SpO2:   100%      Constitutional: Elderly male who appears to be in no acute distress Eyes: PERRL, lids and conjunctivae normal ENMT: Mucous membranes are moist.  Left-sided facial droop present. Neck: normal, supple  Respiratory: Normal respiratory effort with decreased  overall aeration noted in the lower lung fields. Cardiovascular: Irregular irregular.  At least +1 pitting bilateral lower extremity edema. Abdomen: no tenderness, no masses palpated. No hepatosplenomegaly. Bowel sounds positive.  Musculoskeletal: no clubbing / cyanosis. No joint deformity upper and lower extremities. Good ROM, no contractures. Normal muscle tone.  Skin: Necrotic appearance of the distal aspect of left great toe as seen below.  Neurologic: CN 2-12 grossly intact. Sensation intact, DTR normal. Strength 5/5 in all 4.  Psychiatric: Normal judgment and insight. Alert and oriented x 3. Normal mood.   Data Reviewed:  EKG revealed atrial fibrillation at 92 bpm. Reviewed labs, imaging, and pertinent records as noted above in HPI.  Assessment and Plan:  Bilateral pleural effusion Diastolic congestive heart failure exacerbation Acute.  Patient presents with progressively worsening shortness of breath.  Chest x-ray noted concern for increasing pleural effusions moderate to large on the left and small right pleural effusion with adjacent opacity.  Previous required bilateral thoracentesis in March of this year which was noted to be transudative in nature with no malignant cells seen at that time.  Patient had been given Lasix 40 mg IV.  Progress note patient had been told to stop several medications -Admit to a cardiac telemetry bed -Heart failure order set utilized -Strict I&Os and daily weights -Check echocardiogram -Check two-view chest x-ray in a.m. -Lasix 40 mg IV daily.  Reassess and adjust diuresis as needed -Consider need of thoracentesis in a.m. -Appreciate cardiology consultative services, will follow-up for any further recommendations  Chronic atrial fibrillation on chronic anticoagulation SVT Tachybradycardia syndrome s/p PPM Patient appears to be in atrial fibrillation and pacemaker was interrogated a with note of show arrhythmias.  CHA2DS2-VASc score =8. -Heparin drip  per pharmacy in case of need of procedure -Goal potassium at least 4 and magnesium at least 2.  Replace electrolytes as needed  Essential hypertension Blood pressures currently maintained on 113/92-131/81.  Patient had not been taking metoprolol as previously told to stop this medication. -Continue midodrine -Need to follow-up in regards on why patient was told not to take this medication  Peripheral vascular disease Gangrene of the left toe  Patient had been being followed in outpatient setting by podiatry, but after ABI of the left leg were noted be abnormal was sent to Dr. Kirke Corin.  Surgical intervention was recommended, but family was waiting on clearance from his other providers.  He had had similar surgery in 2018.  -Continue outpatient follow-up   Anemia of chronic disease Hemoglobin 9.6 which appears around patient's baseline. -Continue to monitor  Chronic kidney disease stage IV Creatinine 2.3 which appears around patient's baseline. -Continue to monitor kidney function with diuresis -Consider need to formally consult nephrology  Diabetes mellitus type 2 On admission glucose 177.  Last available hemoglobin A1c 9.2 on 07/06/2022. -Hypoglycemic protocols -CBGs before every meal with very sensitive SSI -Adjust insulin regimen as needed  Alzheimer's dementia Chronic. -Delirium precautions -Continue Namenda  History of CVA Patient with sided facial droop  BPH -Continue Proscar and Flomax  DVT prophylaxis: Heparin Advance Care Planning:   Code Status: DNR   Consults: Cardiology  Family Communication: Grandson-in-law updated at bedside  Severity of Illness: The appropriate patient status for this patient is INPATIENT. Inpatient status is judged to be reasonable and necessary in order to provide the required intensity of service to ensure the patient's safety. The patient's presenting symptoms, physical exam findings, and initial radiographic and laboratory data in the  context of their chronic comorbidities is felt to place them at high risk for further clinical deterioration. Furthermore, it is not anticipated that the patient will be medically stable for discharge from the hospital within 2 midnights of admission.   * I certify that at the point of admission it is my clinical judgment that the patient will require inpatient hospital care spanning beyond 2 midnights from the point of admission due to high intensity of service, high risk for further deterioration and high frequency of surveillance required.*  Author: Clydie Braun, MD 10/07/2022 12:16 PM  For on call review [email or web address].

## 2022-10-07 NOTE — ED Provider Notes (Addendum)
EMERGENCY DEPARTMENT AT [facility] Provider Note   CSN: [id number] Arrival date & time: 10/07/22  1022     History  Chief Complaint  Patient presents with   Shortness of Breath    [name] is a 89 y.o. male.   Shortness of Breath Patient presents for fatigue and shortness of breath over the past 3 days.  Shortness of breath is primarily when he is exerting himself but will sometimes occur at rest.  He lives independently.  Family checks on him frequently.  Patient is not sure of which medications he takes.  Family reports medication adherence.  Per chart review, he was prescribed Eliquis, Lasix, metoprolol, midodrine.  EMS reports low blood pressures at baseline.  Pressures were in the 110s over 60s range with EMS.  SpO2 is normal on room air prior to arrival.  Currently, patient states that he feels well at rest.     Home Medications Prior to Admission medications   Medication Sig Start Date End Date Taking? Authorizing Provider  apixaban (ELIQUIS) 2.5 MG TABS tablet Take 1 tablet (2.5 mg total) by mouth 2 (two) times daily. 07/29/22   Azucena Fallen, MD  atorvastatin (LIPITOR) 10 MG tablet atorvastatin 10 mg tablet TAKE 1 TABLET BY MOUTH ONCE DAILY AT BEDTIME 05/09/20   [provider]  divalproex (DEPAKOTE SPRINKLE) 125 MG capsule Take 1 capsule (125 mg total) by mouth 2 (two) times daily. 05/27/22 07/22/23  Mliss Sax, MD  docusate sodium (COLACE) 100 MG capsule Take 1 capsule (100 mg total) by mouth daily. 07/10/22   Amin, Ankit Chirag, MD  finasteride (PROSCAR) 5 MG tablet TAKE 1 TABLET (5 MG TOTAL) BY MOUTH DAILY. 06/07/22   Mliss Sax, MD  furosemide (LASIX) 40 MG tablet Take 1 tablet (40 mg total) by mouth daily. 07/30/22   Azucena Fallen, MD  Iron, Ferrous Sulfate, 325 (65 Fe) MG TABS Take 1 tablet every other day. Patient taking differently: Take 1 tablet by mouth every other day. 06/13/22   Mliss Sax, MD  latanoprost (XALATAN) 0.005 % ophthalmic solution Place 1 drop into both eyes at bedtime. 05/03/20   [provider]  loratadine (CLARITIN) 10 MG tablet Take 10 mg by mouth daily as needed for rhinitis.    [provider]  Melatonin 3 MG TBDP Take 1 tablet by mouth at bedtime. Patient taking differently: Take 3 mg by mouth at bedtime. 07/10/22   Amin, Loura Halt, MD  memantine (NAMENDA) 10 MG tablet Take 1 tablet (10 mg total) by mouth 2 (two) times daily. 08/22/22   Micki Riley, MD  metoprolol succinate (TOPROL-XL) 25 MG 24 hr tablet TAKE 1 TAB EVERY EVENING 11/25/21   [provider]  midodrine (PROAMATINE) 2.5 MG tablet Take 1 tablet (2.5 mg total) by mouth 3 (three) times daily with meals. 07/29/22   Azucena Fallen, MD  pantoprazole (PROTONIX) 40 MG tablet TAKE 1 TABLET(40 MG) BY MOUTH DAILY Patient taking differently: Take 40 mg by mouth daily. 01/01/22   Mliss Sax, MD  tamsulosin (FLOMAX) 0.4 MG CAPS capsule Take 1 capsule (0.4 mg total) by mouth daily. 12/04/21   Mliss Sax, MD      Allergies    Patient has no known allergies.    Review of Systems   Review of Systems  Constitutional:  Positive for fatigue.  Respiratory:  Positive for shortness of breath.   All other systems reviewed and  are negative.   Physical Exam Updated Vital Signs BP 123/87   Pulse 88   Temp (!) 97 F (36.1 C) (Temporal)   Resp 20   SpO2 100%  Physical Exam Vitals and nursing note reviewed.  Constitutional:      General: He is not in acute distress.    Appearance: He is well-developed. He is not ill-appearing, toxic-appearing or diaphoretic.  HENT:     Head: Normocephalic and atraumatic.     Mouth/Throat:     Mouth: Mucous membranes are moist.  Eyes:     Extraocular Movements: Extraocular movements intact.     Conjunctiva/sclera: Conjunctivae normal.  Cardiovascular:     Rate and Rhythm: Normal rate. Rhythm irregular.      Heart sounds: No murmur heard. Pulmonary:     Effort: Pulmonary effort is normal. No tachypnea or respiratory distress.     Breath sounds: Normal breath sounds. No decreased breath sounds, wheezing, rhonchi or rales.  Chest:     Chest wall: No tenderness.  Abdominal:     Palpations: Abdomen is soft.     Tenderness: There is no abdominal tenderness.     Comments: Large ventral hernia which patient states is chronic and not worsened.  Musculoskeletal:        General: No swelling.     Cervical back: Normal range of motion and neck supple.     Right lower leg: Edema present.     Left lower leg: Edema present.  Skin:    General: Skin is warm and dry.     Capillary Refill: Capillary refill takes less than 2 seconds.     Coloration: Skin is not cyanotic or pale.  Neurological:     General: No focal deficit present.     Mental Status: He is alert and oriented to person, place, and time.  Psychiatric:        Mood and Affect: Mood normal.        Behavior: Behavior normal.     ED Results / Procedures / Treatments   Labs (all labs ordered are listed, but only abnormal results are displayed) Labs Reviewed  CBC WITH DIFFERENTIAL/PLATELET - Abnormal; Notable for the following components:      Result Value   RBC 3.77 (*)    Hemoglobin 9.6 (*)    HCT 29.8 (*)    MCV 79.0 (*)    MCH 25.5 (*)    RDW 17.9 (*)    All other components within normal limits  I-STAT CHEM 8, ED - Abnormal; Notable for the following components:   BUN 28 (*)    Creatinine, Ser 2.30 (*)    Glucose, Bld 175 (*)    Calcium, Ion 1.11 (*)    Hemoglobin 11.2 (*)    HCT 33.0 (*)    All other components within normal limits  RESP PANEL BY RT-PCR (RSV, FLU A&B, COVID)  RVPGX2  COMPREHENSIVE METABOLIC PANEL  BRAIN NATRIURETIC PEPTIDE  MAGNESIUM  TROPONIN I (HIGH SENSITIVITY)  TROPONIN I (HIGH SENSITIVITY)    EKG EKG Interpretation  Date/Time:  Monday Oct 07 2022 10:35:15 EDT Ventricular Rate:  92 PR  Interval:    QRS Duration: 93 QT Interval:  374 QTC Calculation: 463 R Axis:   0 Text Interpretation: Atrial fibrillation Anterior infarct, old Confirmed by Gloris Manchester [phone]) on 10/07/2022 10:43:10 AM  Radiology DG Chest Port 1 View  Result Date: 10/07/2022 CLINICAL DATA:  Exertional dyspnea EXAM: PORTABLE CHEST 1 VIEW COMPARISON:  X-ray 07/25/2022 FINDINGS:  Increasing now moderate to large left effusion. Increasing small right effusion. Adjacent opacity. Prominent central vasculature. Enlarged heart. Calcified aorta. No pneumothorax. Left upper chest pacemaker. Overlapping cardiac leads. Surgical clips in the upper abdomen. IMPRESSION: Increasing pleural effusions, moderate to large left and small right with the adjacent opacity. Vascular congestion. Electronically Signed   By: Karen Kays M.D.   On: 10/07/2022 11:07    Procedures Procedures    Medications Ordered in ED Medications  furosemide (LASIX) injection 40 mg (has no administration in time range)    ED Course/ Medical Decision Making/ A&P                             Medical Decision Making Amount and/or Complexity of Data Reviewed Labs: ordered. Radiology: ordered.  Risk Prescription drug management. Decision regarding hospitalization.   This patient presents to the ED for concern of shortness of breath, this involves an extensive number of treatment options, and is a complaint that carries with it a high risk of complications and morbidity.  The differential diagnosis includes CHF, pleural effusion, pericardial effusion, pneumonia, reactive airway disease, PE, ACS, anemia   Co morbidities that complicate the patient evaluation  T2DM, atrial fibrillation, pacemaker placement, CVA, anemia, CKD, CHF, Alzheimer disease, GERD   Additional history obtained:  Additional history obtained from EMS External records from outside source obtained and reviewed including EMR   Lab Tests:  I Ordered, and personally  interpreted labs.  The pertinent results include: Baseline anemia, no leukocytosis, baseline creatinine   Imaging Studies ordered:  I ordered imaging studies including chest x-ray I independently visualized and interpreted imaging which showed bilateral pleural effusions, greater on left I agree with the radiologist interpretation   Cardiac Monitoring: / EKG:  The patient was maintained on a cardiac monitor.  I personally viewed and interpreted the cardiac monitored which showed an underlying rhythm of: Atrial fibrillation  Problem List / ED Course / Critical interventions / Medication management  Patient presents for fatigue and shortness of breath over the past several days.  Family ports adherence to his medications, which does include 40 mg of Lasix every other day.  Patient is well-appearing on arrival in the ED.  He currently denies any symptoms while at rest.  He states that his shortness of breath typically occurs with any exertion.  He does live at home independently with close family support.  Currently, lungs are clear to auscultation.  He has a large ventral hernia which he states is chronic.  He does have bilateral lower extremity edema which he states is likely worse than baseline.  BLE edema is equal bilaterally.  On bedside ultrasound, patient has a large pericardial effusion.  Per chart review, last formal echocardiogram was in February.  At that time, moderate pericardial effusion was noted.  Chest x-ray shows recurrence of pleural effusions.  Per chart review, he was hospitalized in March with shortness of breath and underwent therapeutic thoracenteses.  X-ray today shows recurrence of bilateral pleural effusions, greater on the left.  Initial lab work is unremarkable.  I-STAT potassium level was normal.  Lasix was ordered for diuresis.  On interrogation of pacemaker, does appear the patient has had some recent runs of ventricular tachycardia.  Cardiology was consulted.  I spoke  with cardiologist, Dr. Ronette Deter, who will see the patient in consult.  Patient was admitted for further management. I ordered medication including Lasix for diuresis Reevaluation of the patient after  these medicines showed that the patient stayed the same I have reviewed the patients home medicines and have made adjustments as needed   Social Determinants of Health:  Per EMS, lives independently with close family support         Final Clinical Impression(s) / ED Diagnoses Final diagnoses:  Exertional dyspnea  Pleural effusion  Pericardial effusion    Rx / DC Orders ED Discharge Orders     None         Gloris Manchester, MD 10/07/22 1214    Gloris Manchester, MD 10/07/22 1440

## 2022-10-07 NOTE — ED Triage Notes (Signed)
Pt BIB [facility] from home d/t worsening fatigue & exertional SOB over the last 3 days. Lives alone, A/Ox4, family checks on him daily & reports he has not missed any meds for his CHF or A-Fib. EMS reports LS clear & was told his pressures are usually 90/60 & they obtained 112/68, 70/120 bpm in A-Fib, CBG 182, 100% on RA.

## 2022-10-07 NOTE — ED Notes (Signed)
Transfer of care report given to IP RN, he is ready for pt.

## 2022-10-07 NOTE — ED Notes (Signed)
Pt well appearing upon transport to floor.  

## 2022-10-07 NOTE — Progress Notes (Signed)
Echocardiogram 2D Echocardiogram has been performed.  [name] 10/07/2022, 5:18 PM

## 2022-10-07 NOTE — ED Notes (Signed)
This RN did not receive transfer of care report from previous RN.

## 2022-10-07 NOTE — Consult Note (Signed)
Cardiology Consultation   Patient ID: [name] MRN: [id number]; DOB: Apr 10, 1924  Admit date: 10/07/2022 Date of Consult: 10/07/2022  PCP:  Mliss Sax, MD   [facility] Providers Cardiologist:  None  Electrophysiologist:  Lewayne Bunting, MD       Patient Profile:   [name] is a 89 y.o. male with a hx of CVA, atrial fibrillation, Type II diabetes mellitus, peripheral artery disease endovascular intervention of the occluded left popliteal artery and TP trunk using a retrograde access via the left posterior tibial artery, symptomatic bradycardia status post pacemaker implantation, chronic kidney disease who is being seen 10/07/2022 for the evaluation of shortness of breath and NSVT at the request of Dr. Madelyn Flavors.  History of Present Illness:   [name] presented to the emergency department to be evaluated for 3 days of reported shortness of breath on exertion. He also adds that he has had some episodes at rest. He denies chest pain, lightheadedness or dizziness. He tells me that he has some sputum with no color changes but add that he has sputum all the time and this is no different.  Also recently ongoing is his foot wound which he follow with podiatry for treatment.   He was brought in to the ED for the shortness of breath that was worsening. During the ED evaluation he was noted to have pleural effusion on his chest xray: moderate to large on the left and small right effusion with adjacent opacity. He was given Lasix 40 mg IV x 1 dose.   With his pleural effusion and shortness of breath, cardiology was consulted to help with the management of the patient.    Past Medical History:  Diagnosis Date   Acute CVA (cerebrovascular accident) [facility])    Arthritis    Atrial fibrillation ([name])    CKD (chronic kidney disease)    History of hiatal hernia    Hx SBO    Last 2013 all treated conservatively   Hypertension    Presence of  permanent cardiac pacemaker    Prostate cancer ([name])    Stroke ([name]) 2017   left facial drooping noted 08/14/2016   Tachycardia-bradycardia syndrome ([name])    Type II diabetes mellitus ([name])    Ventral hernia    x3    Past Surgical History:  Procedure Laterality Date   ABDOMINAL AORTOGRAM W/LOWER EXTREMITY N/A 08/07/2016   Procedure: Abdominal Aortogram w/Lower Extremity;  Surgeon: Iran Ouch, MD;  Location: [facility] INVASIVE CV LAB;  Service: Cardiovascular;  Laterality: N/A;   CHOLECYSTECTOMY OPEN  03/31/2001   Dr Wiliam Ke   COLON SURGERY     EXPLORATORY LAPAROTOMY  08/2004   Hattie Perch 10/01/2010   HEMORRHOID SURGERY     HERNIA REPAIR     HIATAL HERNIA REPAIR  2002   INCISIONAL HERNIA REPAIR  08/2004   Hattie Perch 10/01/2010   INSERT / REPLACE / REMOVE PACEMAKER     IR THORACENTESIS ASP PLEURAL SPACE W/IMG GUIDE  07/23/2022   IR THORACENTESIS ASP PLEURAL SPACE W/IMG GUIDE  07/24/2022   IR THORACENTESIS ASP PLEURAL SPACE W/IMG GUIDE  07/25/2022   PERIPHERAL VASCULAR BALLOON ANGIOPLASTY  08/07/2016   Procedure: Peripheral Vascular Balloon Angioplasty;  Surgeon: Iran Ouch, MD;  Location: [facility] INVASIVE CV LAB;  Service: Cardiovascular;;  left popliteal artery   PERIPHERAL VASCULAR INTERVENTION  08/14/2016   popliteal artery/notes 08/14/2016   PERIPHERAL VASCULAR INTERVENTION Left 08/14/2016   Procedure: Peripheral Vascular Intervention;  Surgeon: Chelsea Aus  Kirke Corin, MD;  Location: [facility] INVASIVE CV LAB;  Service: Cardiovascular;  Laterality: Left;  popliteal artery   PERMANENT PACEMAKER GENERATOR CHANGE N/A 04/06/2012   Procedure: PERMANENT PACEMAKER GENERATOR CHANGE;  Surgeon: Marinus Maw, MD;  Location: [facility] CATH LAB;  Service: Cardiovascular;  Laterality: N/A;   PPM GENERATOR CHANGEOUT N/A 02/04/2022   Procedure: PPM GENERATOR CHANGEOUT;  Surgeon: Marinus Maw, MD;  Location: [facility] INVASIVE CV LAB;  Service: Cardiovascular;  Laterality: N/A;   SMALL INTESTINE SURGERY  09/16/2004   SBO resection, VH  repair      Inpatient Medications: Scheduled Meds:  divalproex  125 mg Oral BID   [START ON 10/08/2022] docusate sodium  100 mg Oral Daily   [START ON 10/08/2022] finasteride  5 mg Oral Daily   insulin aspart  0-6 Units Subcutaneous TID WC   latanoprost  1 drop Both Eyes QHS   melatonin  3 mg Oral QHS   memantine  10 mg Oral BID   midodrine  2.5 mg Oral TID WC   sodium chloride flush  3 mL Intravenous Q12H   [START ON 10/08/2022] tamsulosin  0.4 mg Oral Daily   Continuous Infusions:  heparin     PRN Meds: acetaminophen **OR** acetaminophen, albuterol, perflutren lipid microspheres (DEFINITY) IV suspension  Allergies:   No Known Allergies  Social History:   Social History   Socioeconomic History   Marital status: Widowed    Spouse name: Not on file   Number of children: Not on file   Years of education: Not on file   Highest education level: Not on file  Occupational History   Not on file  Tobacco Use   Smoking status: Former    Packs/day: 1.50    Years: 15.00    Additional pack years: 0.00    Total pack years: 22.50    Types: Cigarettes    Quit date: 04/16/1955    Years since quitting: 67.5    Passive exposure: Past   Smokeless tobacco: Former    Types: Chew    Quit date: 08/17/1952  Vaping Use   Vaping Use: Never used  Substance and Sexual Activity   Alcohol use: No    Comment: 08/14/2016 "drank beer when I was a teenager"   Drug use: No   Sexual activity: Not Currently  Other Topics Concern   Not on file  Social History Narrative   Lives alone   Social Determinants of Health   Financial Resource Strain: Low Risk  (08/19/2022)   Overall Financial Resource Strain (CARDIA)    Difficulty of Paying Living Expenses: Not hard at all  Food Insecurity: No Food Insecurity (08/19/2022)   Hunger Vital Sign    Worried About Running Out of Food in the Last Year: Never true    Ran Out of Food in the Last Year: Never true  Transportation Needs: No Transportation Needs  (08/19/2022)   PRAPARE - Administrator, Civil Service (Medical): No    Lack of Transportation (Non-Medical): No  Physical Activity: Sufficiently Active (08/19/2022)   Exercise Vital Sign    Days of Exercise per Week: 5 days    Minutes of Exercise per Session: 30 min  Stress: No Stress Concern Present (08/19/2022)   [employer] of Occupational Health - Occupational Stress Questionnaire    Feeling of Stress : Only a little  Social Connections: Moderately Isolated (08/19/2022)   Social Connection and Isolation Panel [NHANES]    Frequency of Communication with Friends and Family:  More than three times a week    Frequency of Social Gatherings with Friends and Family: More than three times a week    Attends Religious Services: More than 4 times per year    Active Member of Clubs or Organizations: No    Attends Banker Meetings: Never    Marital Status: Widowed  Intimate Partner Violence: Not At Risk (08/19/2022)   Humiliation, Afraid, Rape, and Kick questionnaire    Fear of Current or Ex-Partner: No    Emotionally Abused: No    Physically Abused: No    Sexually Abused: No    Family History:    Family History  Problem Relation Age of Onset   Pneumonia Father    Stroke Brother    Stroke Sister    Cancer Brother        type unknown     ROS:  Please see the history of present illness.  Shortness of breath  All other ROS reviewed and negative.     Physical Exam/Data:   Vitals:   10/07/22 1515 10/07/22 1530 10/07/22 1805 10/07/22 1834  BP: (!) 130/97 126/86 (!) 134/98 (!) 143/98  Pulse:  91 90   Resp:  19 20 20   Temp:  (!) 97.2 F (36.2 C)    TempSrc:      SpO2:  98% 97% 100%  Weight:       No intake or output data in the 24 hours ending 10/07/22 1838    10/07/2022    2:00 PM 09/24/2022    8:26 AM 09/11/2022   11:05 AM  Last 3 Weights  Weight (lbs) 152 lb 12.5 oz 152 lb 12.8 oz 151 lb  Weight (kg) 69.3 kg 69.31 kg 68.493 kg     Body mass index is  21.31 kg/m.  General:  Well nourished, well developed, in no acute distress HEENT: normal Neck: no JVD Vascular: No carotid bruits; Distal pulses 2+ bilaterally Cardiac:  normal S1, S2; RRR; no murmur  Lungs:  clear to auscultation bilaterally, no wheezing, rhonchi or rales  Abd: soft, nontender, no hepatomegaly  Ext: no edema Musculoskeletal:  No deformities, BUE and BLE strength normal and equal Skin: warm and dry  Neuro:  CNs 2-12 intact, no focal abnormalities noted Psych:  Normal affect   EKG:  The EKG was personally reviewed and demonstrates:  atrial fibrillation with controlled ventricular rate Telemetry:  Telemetry was personally reviewed and demonstrates:    Relevant CV Studies:  TTE 07/07/2022 IMPRESSIONS    1. Left ventricular ejection fraction, by estimation, is 50 to 55%. The  left ventricle has low normal function. The left ventricle has no regional  wall motion abnormalities. There is severe left ventricular hypertrophy.  Left ventricular diastolic  parameters are indeterminate.   2. Pacing wire in RA/RV . Right ventricular systolic function is normal.  The right ventricular size is normal. There is normal pulmonary artery  systolic pressure.   3. Left atrial size was moderately dilated.   4. IVC dilated but does reduce on inpspiration no RARV diastolic collapse  Effuson appears larger than seen on TTE 12/18/21 . Moderate pericardial  effusion. The pericardial effusion is near circumferential.   5. The mitral valve is abnormal. Mild mitral valve regurgitation. No  evidence of mitral stenosis.   6. The aortic valve is tricuspid. There is moderate calcification of the  aortic valve. There is moderate thickening of the aortic valve. Aortic  valve regurgitation is mild. Aortic valve sclerosis is  present, with no  evidence of aortic valve stenosis.   7. Aortic dilatation noted. There is mild dilatation of the aortic root,  measuring 40 mm.   8. The inferior vena cava  is normal in size with greater than 50%  respiratory variability, suggesting right atrial pressure of 3 mmHg.   FINDINGS   Left Ventricle: Left ventricular ejection fraction, by estimation, is 50  to 55%. The left ventricle has low normal function. The left ventricle has  no regional wall motion abnormalities. The left ventricular internal  cavity size was normal in size.  There is severe left ventricular hypertrophy. Left ventricular diastolic  parameters are indeterminate.   Right Ventricle: Pacing wire in RA/RV. The right ventricular size is  normal. No increase in right ventricular wall thickness. Right ventricular  systolic function is normal. There is normal pulmonary artery systolic  pressure. The tricuspid regurgitant  velocity is 2.57 m/s, and with an assumed right atrial pressure of 3 mmHg,  the estimated right ventricular systolic pressure is 29.4 mmHg.   Left Atrium: Left atrial size was moderately dilated.   Right Atrium: Right atrial size was normal in size.   Pericardium: IVC dilated but does reduce on inpspiration no RARV diastolic  collapse Effuson appears larger than seen on TTE 12/18/21. A moderately  sized pericardial effusion is present. The pericardial effusion is near  circumferential.   Mitral Valve: The mitral valve is abnormal. There is mild thickening of  the mitral valve leaflet(s). There is mild calcification of the mitral  valve leaflet(s). Mild mitral annular calcification. Mild mitral valve  regurgitation. No evidence of mitral  valve stenosis.   Tricuspid Valve: The tricuspid valve is normal in structure. Tricuspid  valve regurgitation is mild . No evidence of tricuspid stenosis.   Aortic Valve: The aortic valve is tricuspid. There is moderate  calcification of the aortic valve. There is moderate thickening of the  aortic valve. Aortic valve regurgitation is mild. Aortic valve sclerosis  is present, with no evidence of aortic valve  stenosis.    Pulmonic Valve: The pulmonic valve was normal in structure. Pulmonic valve  regurgitation is not visualized. No evidence of pulmonic stenosis.   Aorta: Aortic dilatation noted. There is mild dilatation of the aortic  root, measuring 40 mm.   Venous: The inferior vena cava is normal in size with greater than 50%  respiratory variability, suggesting right atrial pressure of 3 mmHg.   IAS/Shunts: No atrial level shunt detected by color flow Doppler.   Laboratory Data:  High Sensitivity Troponin:   Recent Labs  Lab 10/07/22 1110 10/07/22 1249  TROPONINIHS 26* 27*     Chemistry Recent Labs  Lab 10/07/22 1110  NA 139  142  K 4.4  4.4  CL 104  103  CO2 25  GLUCOSE 177*  175*  BUN 27*  28*  CREATININE 2.30*  2.30*  CALCIUM 8.0*  MG 1.8  GFRNONAA 25*  ANIONGAP 10    Recent Labs  Lab 10/07/22 1110  PROT 6.2*  ALBUMIN 2.9*  AST 15  ALT 9  ALKPHOS 68  BILITOT 0.4   Lipids No results for input(s): "CHOL", "TRIG", "HDL", "LABVLDL", "LDLCALC", "CHOLHDL" in the last 168 hours.  Hematology Recent Labs  Lab 10/07/22 1110  WBC 7.0  RBC 3.77*  HGB 9.6*  11.2*  HCT 29.8*  33.0*  MCV 79.0*  MCH 25.5*  MCHC 32.2  RDW 17.9*  PLT 162   Thyroid No results for input(s): "TSH", "  FREET4" in the last 168 hours.  BNP Recent Labs  Lab 10/07/22 1110  BNP 352.0*    DDimer No results for input(s): "DDIMER" in the last 168 hours.   Radiology/Studies:  DG Chest Port 1 View  Result Date: 10/07/2022 CLINICAL DATA:  Exertional dyspnea EXAM: PORTABLE CHEST 1 VIEW COMPARISON:  X-ray 07/25/2022 FINDINGS: Increasing now moderate to large left effusion. Increasing small right effusion. Adjacent opacity. Prominent central vasculature. Enlarged heart. Calcified aorta. No pneumothorax. Left upper chest pacemaker. Overlapping cardiac leads. Surgical clips in the upper abdomen. IMPRESSION: Increasing pleural effusions, moderate to large left and small right with the adjacent  opacity. Vascular congestion. Electronically Signed   By: Karen Kays M.D.   On: 10/07/2022 11:07     Assessment and Plan:   Shortness of breath  Elevated trop - flat, no need to trent this Bilateral pleural effusion  Permanent atrial fibrillation on chronic anticoagulation  Tachy-brady syndrome status post pacemaker implantation  PAD Chronic Kidney disease stage IV Diabetes Mellitus  Dementia    Seen and examined at his bedside in the ED. He is oriented to self and can give limited hx.   His shortness of breath is multifactorial: bilateral  pleural effusion agree with dose of lasix. Will monitor his I&Os. Also with his stage 4 kidney disease it will be also beneficial to have nephrology see the patient as he may need further diuretic dosing vs consideration for thoracocentesis - will defer to the primary team for this.   Echo ordered - known pericardial effusion which was moderate. Clinically he is hemodynamically stable . Will follow echo result and make recommendation as appropriate.   Please stop trending trop -  its flat with no angina symptom. No need for ischemic evaluation at this time.   Continue home rate control agent, metoprolol and continue his Eliquis for anticoagulation.    Risk Assessment/Risk Scores:          CHA2DS2-VASc Score = 8   This indicates a 10.8% annual risk of stroke. The patient's score is based upon: CHF History: 1 HTN History: 1 Diabetes History: 1 Stroke History: 2 Vascular Disease History: 1 Age Score: 2 Gender Score: 0         For questions or updates, please contact [facility] Please consult [email or web address] for contact info under    Osvaldo Shipper, DO  10/07/2022 6:38 PM

## 2022-10-07 NOTE — ED Notes (Signed)
Trey Paula RN requested a call back in 10 mins.

## 2022-10-07 NOTE — Progress Notes (Addendum)
ANTICOAGULATION CONSULT NOTE - Initial Consult  Pharmacy Consult for heparin Indication: atrial fibrillation  No Known Allergies  Patient Measurements:   Heparin Dosing Weight: 69.3 kg   Vital Signs: Temp: 97 F (36.1 C) (05/20 1034) Temp Source: Temporal (05/20 1034) BP: 119/85 (05/20 1400) Pulse Rate: 47 (05/20 1400)  Labs: Recent Labs    10/07/22 1110 10/07/22 1249  HGB 9.6*  11.2*  --   HCT 29.8*  33.0*  --   PLT 162  --   CREATININE 2.30*  2.30*  --   TROPONINIHS 26* 27*    Estimated Creatinine Clearance: 18.4 mL/min (A) (by C-G formula based on SCr of 2.3 mg/dL (H)).   Medical History: Past Medical History:  Diagnosis Date   Acute CVA (cerebrovascular accident) [facility])    Arthritis    Atrial fibrillation ([name])    CKD (chronic kidney disease)    History of hiatal hernia    Hx SBO    Last 2013 all treated conservatively   Hypertension    Presence of permanent cardiac pacemaker    Prostate cancer ([name])    Stroke ([name]) 2017   left facial drooping noted 08/14/2016   Tachycardia-bradycardia syndrome ([name])    Type II diabetes mellitus ([name])    Ventral hernia    x3    Medications:  Patient is on eliquis 2.5 mg BID prior to admission for his Afib. His last dose was this morning, 5/20 at 0900.   Assessment: Lisle is presenting today feeling short of breath. Pacemaker interrogation reveals runs of SVT. He will be admitted for further evaluation by cardiology. No bolus as patient just took Eliquis this AM. Will delay start of heparin until 9pm. Will measure aptt and heparin level until correlating.   Goal of Therapy:  Heparin level 0.3-0.7 units/ml aPTT 66-102 seconds Monitor platelets by anticoagulation protocol: Yes   Plan:  Start heparin infusion at 950 units/hr Check anti-Xa level and aptt in 8 hours and daily while on heparin Continue to monitor H&H and platelets  Blane Ohara, PharmD  PGY1 Pharmacy Resident

## 2022-10-07 NOTE — ED Notes (Addendum)
ECHO at bedside. NAD noted at this time.

## 2022-10-08 ENCOUNTER — Inpatient Hospital Stay (HOSPITAL_COMMUNITY): Payer: No Typology Code available for payment source

## 2022-10-08 DIAGNOSIS — R0609 Other forms of dyspnea: Secondary | ICD-10-CM

## 2022-10-08 DIAGNOSIS — I3139 Other pericardial effusion (noninflammatory): Secondary | ICD-10-CM

## 2022-10-08 DIAGNOSIS — J90 Pleural effusion, not elsewhere classified: Secondary | ICD-10-CM | POA: Diagnosis not present

## 2022-10-08 DIAGNOSIS — I5033 Acute on chronic diastolic (congestive) heart failure: Secondary | ICD-10-CM | POA: Diagnosis not present

## 2022-10-08 LAB — CBC
HCT: 31.5 % — ABNORMAL LOW (ref 39.0–52.0)
Hemoglobin: 10.2 g/dL — ABNORMAL LOW (ref 13.0–17.0)
MCH: 24.7 pg — ABNORMAL LOW (ref 26.0–34.0)
MCHC: 32.4 g/dL (ref 30.0–36.0)
MCV: 76.3 fL — ABNORMAL LOW (ref 80.0–100.0)
Platelets: 162 10*3/uL (ref 150–400)
RBC: 4.13 MIL/uL — ABNORMAL LOW (ref 4.22–5.81)
RDW: 17.9 % — ABNORMAL HIGH (ref 11.5–15.5)
WBC: 7.2 10*3/uL (ref 4.0–10.5)
nRBC: 0 % (ref 0.0–0.2)

## 2022-10-08 LAB — GLUCOSE, CAPILLARY
Glucose-Capillary: 142 mg/dL — ABNORMAL HIGH (ref 70–99)
Glucose-Capillary: 107 mg/dL — ABNORMAL HIGH (ref 70–99)

## 2022-10-08 LAB — BASIC METABOLIC PANEL
Anion gap: 12 (ref 5–15)
BUN: 31 mg/dL — ABNORMAL HIGH (ref 8–23)
CO2: 23 mmol/L (ref 22–32)
Calcium: 8.3 mg/dL — ABNORMAL LOW (ref 8.9–10.3)
Chloride: 103 mmol/L (ref 98–111)
Creatinine, Ser: 2.25 mg/dL — ABNORMAL HIGH (ref 0.61–1.24)
GFR, Estimated: 26 mL/min — ABNORMAL LOW (ref >60)
Glucose, Bld: 136 mg/dL — ABNORMAL HIGH (ref 70–99)
Potassium: 4.5 mmol/L (ref 3.5–5.1)
Sodium: 138 mmol/L (ref 135–145)

## 2022-10-08 LAB — APTT
aPTT: 53 seconds — ABNORMAL HIGH (ref 24–36)
aPTT: 124 seconds — ABNORMAL HIGH (ref 24–36)
aPTT: 47 seconds — ABNORMAL HIGH (ref 24–36)

## 2022-10-08 LAB — HEPARIN LEVEL (UNFRACTIONATED): Heparin Unfractionated: >1.1 IU/mL — ABNORMAL HIGH (ref 0.30–0.70)

## 2022-10-08 MED ORDER — HEPARIN (PORCINE) 25000 UT/250ML-% IV SOLN
800.0000 [IU]/h | INTRAVENOUS | Status: DC
  Administered 2022-10-08 – 2022-10-09 (×2): 800 [IU]/h via INTRAVENOUS
  Filled 2022-10-08: qty 250

## 2022-10-08 MED ORDER — INSULIN ASPART 100 UNIT/ML IJ SOLN
0.0000 [IU] | Freq: Every day | INTRAMUSCULAR | Status: DC
  Administered 2022-10-15 (×2): 2 [IU] via SUBCUTANEOUS

## 2022-10-08 MED ORDER — FUROSEMIDE 10 MG/ML IJ SOLN: 40.0000 mg | Freq: Once | INTRAMUSCULAR | Status: DC

## 2022-10-08 MED ORDER — INSULIN ASPART 100 UNIT/ML IJ SOLN
0.0000 [IU] | Freq: Three times a day (TID) | INTRAMUSCULAR | Status: DC
  Administered 2022-10-11 – 2022-10-15 (×4): 1 [IU] via SUBCUTANEOUS

## 2022-10-08 NOTE — Progress Notes (Signed)
Progress Note  Patient Name: Desi Stacey Date of Encounter: 10/08/2022  Primary Cardiologist: None   Subjective   Patient seen examined his bedside.  Short of breath at the bedside.  Inpatient Medications    Scheduled Meds:  divalproex  125 mg Oral BID   docusate sodium  100 mg Oral Daily   finasteride  5 mg Oral Daily   furosemide  40 mg Intravenous Daily   insulin aspart  0-5 Units Subcutaneous QHS   insulin aspart  0-6 Units Subcutaneous TID WC   insulin aspart  0-6 Units Subcutaneous TID WC   latanoprost  1 drop Both Eyes QHS   melatonin  3 mg Oral QHS   memantine  10 mg Oral BID   midodrine  2.5 mg Oral TID WC   pantoprazole  40 mg Oral Daily   sodium chloride flush  3 mL Intravenous Q12H   tamsulosin  0.4 mg Oral Daily   Continuous Infusions:  heparin 800 Units/hr (10/08/22 1146)   PRN Meds: acetaminophen **OR** acetaminophen, albuterol, mouth rinse   Vital Signs    Vitals:   10/08/22 0418 10/08/22 0712 10/08/22 1014 10/08/22 1100  BP: 105/84 104/80  98/71  Pulse:  (!) 106  (!) 105  Resp:  19  17  Temp:  (!) 97.5 F (36.4 C)  97.6 F (36.4 C)  TempSrc:  Oral  Oral  SpO2: 95% 98% 98% 100%  Weight:      Height:        Intake/Output Summary (Last 24 hours) at 10/08/2022 1211 Last data filed at 10/08/2022 0025 Gross per 24 hour  Intake --  Output 1450 ml  Net -1450 ml   Filed Weights   10/07/22 1400 10/07/22 1900 10/08/22 0416  Weight: 69.3 kg 67.3 kg 67.5 kg    Telemetry     - Personally Reviewed  ECG     - Personally Reviewed  Physical Exam     General:NAD Head: Atraumatic, normal size  Eyes: PEERLA, EOMI  Neck: Supple, normal JVD Cardiac: Normal S1, S2; RRR; no murmurs, rubs, or gallops Lungs: Clear to auscultation bilaterally Abd: Soft, nontender, no hepatomegaly  Ext: warm, no edema Musculoskeletal: No deformities, BUE and BLE strength normal and equal Skin: Warm and dry, no rashes   Neuro: Alert and oriented to  person, place, time, and situation, CNII-XII grossly intact, no focal deficits  Psych: Normal mood and affect   Labs    Chemistry Recent Labs  Lab 10/07/22 1110 10/08/22 0112  NA 139  142 138  K 4.4  4.4 4.5  CL 104  103 103  CO2 25 23  GLUCOSE 177*  175* 136*  BUN 27*  28* 31*  CREATININE 2.30*  2.30* 2.25*  CALCIUM 8.0* 8.3*  PROT 6.2*  --   ALBUMIN 2.9*  --   AST 15  --   ALT 9  --   ALKPHOS 68  --   BILITOT 0.4  --   GFRNONAA 25* 26*  ANIONGAP 10 12     Hematology Recent Labs  Lab 10/07/22 1110 10/08/22 0112  WBC 7.0 7.2  RBC 3.77* 4.13*  HGB 9.6*  11.2* 10.2*  HCT 29.8*  33.0* 31.5*  MCV 79.0* 76.3*  MCH 25.5* 24.7*  MCHC 32.2 32.4  RDW 17.9* 17.9*  PLT 162 162    Cardiac EnzymesNo results for input(s): "TROPONINI" in the last 168 hours. No results for input(s): "TROPIPOC" in the last 168 hours.   BNP  Recent Labs  Lab 10/07/22 1110  BNP 352.0*     DDimer No results for input(s): "DDIMER" in the last 168 hours.   Radiology    DG Chest 2 View  Result Date: 10/08/2022 CLINICAL DATA:  Pleural effusion EXAM: CHEST - 2 VIEW COMPARISON:  10/07/2022 FINDINGS: Left-sided implanted cardiac device remains in place. Heart size appears enlarged although is partially obscured. Large left and small right pleural effusions, slightly increased. Increasing bibasilar airspace opacities. No pneumothorax. IMPRESSION: Large left and small right pleural effusions, slightly increased. Increasing bibasilar airspace opacities. Electronically Signed   By: Duanne Guess D.O.   On: 10/08/2022 08:31   CUP PACEART REMOTE DEVICE CHECK  Result Date: 10/07/2022 Scheduled remote reviewed. Normal device function.  11 VHR classified events, 4 sec. Next remote 91 days. MC, CVRS.  ECHOCARDIOGRAM COMPLETE  Result Date: 10/07/2022    ECHOCARDIOGRAM REPORT   Patient Name:   LEBRON HELFERICH [facility] Date of Exam: 10/07/2022 Medical Rec #:  [id number]             Height:       71.0  in Accession #:    [id number]            Weight:       152.8 lb Date of Birth:  August 25, 1923            BSA:          1.881 m Patient Age:    89 years              BP:           119/85 mmHg Patient Gender: M                     HR:           96 bpm. Exam Location:  Inpatient Procedure: 2D Echo, Cardiac Doppler, Color Doppler and Intracardiac            Opacification Agent Indications:    Pericardial effusion I31.3  History:        Patient has prior history of Echocardiogram examinations, most                 recent 07/07/2022. CHF, Stroke, Arrythmias:Atrial Fibrillation,                 Bradycardia and Tachycardia, Signs/Symptoms:Hypotension and                 Dyspnea; Risk Factors:Hypertension and Diabetes. CKD, stage 3.  Sonographer:    Lucendia Herrlich Referring Phys: [id number] RONDELL A SMITH  Sonographer Comments: Image acquisition challenging due to patient behavioral factors. IMPRESSIONS  1. Left ventricular ejection fraction, by estimation, is 50 to 55%. The left ventricle has low normal function. The left ventricle has no regional wall motion abnormalities. There is severe left ventricular hypertrophy. Left ventricular diastolic parameters are indeterminate.  2. Right ventricular systolic function is normal. The right ventricular size is normal. There is mildly elevated pulmonary artery systolic pressure.  3. Left atrial size was severely dilated.  4. Large pericardial effusion. There is no evidence of cardiac tamponade.  5. The mitral valve was not well visualized. Trivial mitral valve regurgitation. No evidence of mitral stenosis.  6. The aortic valve is tricuspid. There is mild calcification of the aortic valve. There is mild thickening of the aortic valve. Aortic valve regurgitation is trivial. Aortic valve sclerosis is present, with no evidence of aortic valve stenosis.  7. The  inferior vena cava is dilated in size with >50% respiratory variability, suggesting right atrial pressure of 8 mmHg.  Comparison(s): Changes from prior study are noted. Pericardial effusion is larger than prior, though no echo evidence of tamponade. FINDINGS  Left Ventricle: Left ventricular ejection fraction, by estimation, is 50 to 55%. The left ventricle has low normal function. The left ventricle has no regional wall motion abnormalities. Definity contrast agent was given IV to delineate the left ventricular endocardial borders. The left ventricular internal cavity size was normal in size. There is severe left ventricular hypertrophy. Left ventricular diastolic parameters are indeterminate. Right Ventricle: The right ventricular size is normal. Right vetricular wall thickness was not well visualized. Right ventricular systolic function is normal. There is mildly elevated pulmonary artery systolic pressure. The tricuspid regurgitant velocity  is 2.78 m/s, and with an assumed right atrial pressure of 8 mmHg, the estimated right ventricular systolic pressure is 38.9 mmHg. Left Atrium: Left atrial size was severely dilated. Right Atrium: Right atrial size was normal in size. Pericardium: Large pericardial effusion, maximum diameter 3.72 cm posterior to LV. Adherent material seen in pericardial fluid, but fluid generally low density. A large pericardial effusion is present. There is no evidence of cardiac tamponade. Mitral Valve: The mitral valve was not well visualized. Trivial mitral valve regurgitation. No evidence of mitral valve stenosis. Tricuspid Valve: The tricuspid valve is normal in structure. Tricuspid valve regurgitation is mild . No evidence of tricuspid stenosis. Aortic Valve: The aortic valve is tricuspid. There is mild calcification of the aortic valve. There is mild thickening of the aortic valve. Aortic valve regurgitation is trivial. Aortic valve sclerosis is present, with no evidence of aortic valve stenosis. Aortic valve peak gradient measures 2.6 mmHg. Pulmonic Valve: The pulmonic valve was not well visualized.  Pulmonic valve regurgitation is not visualized. No evidence of pulmonic stenosis. Aorta: The aortic root, ascending aorta, aortic arch and descending aorta are all structurally normal, with no evidence of dilitation or obstruction. Venous: The inferior vena cava is dilated in size with greater than 50% respiratory variability, suggesting right atrial pressure of 8 mmHg. IAS/Shunts: The atrial septum is grossly normal. Additional Comments: A device lead is visualized in the right ventricle and right atrium.  LEFT VENTRICLE PLAX 2D LVIDd:         3.60 cm   Diastology LVIDs:         2.70 cm   LV e' lateral:   10.70 cm/s LV PW:         1.30 cm   LV E/e' lateral: 4.9 LV IVS:        1.80 cm LVOT diam:     2.10 cm LV SV:         31 LV SV Index:   17 LVOT Area:     3.46 cm  RIGHT VENTRICLE             IVC RV S prime:     14.30 cm/s  IVC diam: 2.00 cm LEFT ATRIUM            Index        RIGHT ATRIUM           Index LA diam:      4.30 cm  2.29 cm/m   RA Area:     15.10 cm LA Vol (A2C): 89.7 ml  47.70 ml/m  RA Volume:   32.70 ml  17.39 ml/m LA Vol (A4C): 112.0 ml 59.56 ml/m  AORTIC VALVE AV  Area (Vmax): 2.60 cm AV Vmax:        80.10 cm/s AV Peak Grad:   2.6 mmHg LVOT Vmax:      60.05 cm/s LVOT Vmean:     38.650 cm/s LVOT VTI:       0.091 m  AORTA Ao Root diam: 4.00 cm Ao Asc diam:  3.60 cm MITRAL VALVE               TRICUSPID VALVE MV Area (PHT): 6.54 cm    TR Peak grad:   30.9 mmHg MV Decel Time: 116 msec    TR Vmax:        278.00 cm/s MV E velocity: 52.00 cm/s MV A velocity: 35.50 cm/s  SHUNTS MV E/A ratio:  1.46        Systemic VTI:  0.09 m                            Systemic Diam: 2.10 cm Jodelle Red MD Electronically signed by Jodelle Red MD Signature Date/Time: 10/07/2022/7:54:10 PM    Final    DG Chest Port 1 View  Result Date: 10/07/2022 CLINICAL DATA:  Exertional dyspnea EXAM: PORTABLE CHEST 1 VIEW COMPARISON:  X-ray 07/25/2022 FINDINGS: Increasing now moderate to large left effusion.  Increasing small right effusion. Adjacent opacity. Prominent central vasculature. Enlarged heart. Calcified aorta. No pneumothorax. Left upper chest pacemaker. Overlapping cardiac leads. Surgical clips in the upper abdomen. IMPRESSION: Increasing pleural effusions, moderate to large left and small right with the adjacent opacity. Vascular congestion. Electronically Signed   By: Karen Kays M.D.   On: 10/07/2022 11:07    Cardiac Studies   TTE 10/07/2022 IMPRESSIONS     1. Left ventricular ejection fraction, by estimation, is 50 to 55%. The  left ventricle has low normal function. The left ventricle has no regional  wall motion abnormalities. There is severe left ventricular hypertrophy.  Left ventricular diastolic  parameters are indeterminate.   2. Right ventricular systolic function is normal. The right ventricular  size is normal. There is mildly elevated pulmonary artery systolic  pressure.   3. Left atrial size was severely dilated.   4. Large pericardial effusion. There is no evidence of cardiac tamponade.   5. The mitral valve was not well visualized. Trivial mitral valve  regurgitation. No evidence of mitral stenosis.   6. The aortic valve is tricuspid. There is mild calcification of the  aortic valve. There is mild thickening of the aortic valve. Aortic valve  regurgitation is trivial. Aortic valve sclerosis is present, with no  evidence of aortic valve stenosis.   7. The inferior vena cava is dilated in size with >50% respiratory  variability, suggesting right atrial pressure of 8 mmHg.   Comparison(s): Changes from prior study are noted. Pericardial effusion is  larger than prior, though no echo evidence of tamponade.   FINDINGS   Left Ventricle: Left ventricular ejection fraction, by estimation, is 50  to 55%. The left ventricle has low normal function. The left ventricle has  no regional wall motion abnormalities. Definity contrast agent was given  IV to delineate the  left  ventricular endocardial borders. The left ventricular internal cavity size  was normal in size. There is severe left ventricular hypertrophy. Left  ventricular diastolic parameters are indeterminate.   Right Ventricle: The right ventricular size is normal. Right vetricular  wall thickness was not well visualized. Right ventricular systolic  function is normal. There  is mildly elevated pulmonary artery systolic  pressure. The tricuspid regurgitant velocity   is 2.78 m/s, and with an assumed right atrial pressure of 8 mmHg, the  estimated right ventricular systolic pressure is 38.9 mmHg.   Left Atrium: Left atrial size was severely dilated.   Right Atrium: Right atrial size was normal in size.   Pericardium: Large pericardial effusion, maximum diameter 3.72 cm  posterior to LV. Adherent material seen in pericardial fluid, but fluid  generally low density. A large pericardial effusion is present. There is  no evidence of cardiac tamponade.   Mitral Valve: The mitral valve was not well visualized. Trivial mitral  valve regurgitation. No evidence of mitral valve stenosis.   Tricuspid Valve: The tricuspid valve is normal in structure. Tricuspid  valve regurgitation is mild . No evidence of tricuspid stenosis.   Aortic Valve: The aortic valve is tricuspid. There is mild calcification  of the aortic valve. There is mild thickening of the aortic valve. Aortic  valve regurgitation is trivial. Aortic valve sclerosis is present, with no  evidence of aortic valve  stenosis. Aortic valve peak gradient measures 2.6 mmHg.   Pulmonic Valve: The pulmonic valve was not well visualized. Pulmonic valve  regurgitation is not visualized. No evidence of pulmonic stenosis.   Aorta: The aortic root, ascending aorta, aortic arch and descending aorta  are all structurally normal, with no evidence of dilitation or  obstruction.   Venous: The inferior vena cava is dilated in size with greater than  50%  respiratory variability, suggesting right atrial pressure of 8 mmHg.   IAS/Shunts: The atrial septum is grossly normal.   Patient Profile     89 y.o. male  CVA, atrial fibrillation, Type II diabetes mellitus, peripheral artery disease endovascular intervention of the occluded left popliteal artery and TP trunk using a retrograde access via the left posterior tibial artery, symptomatic bradycardia status post pacemaker implantation, chronic kidney disease   Assessment & Plan      Large pericardial effusion Shortness of breath  Elevated trop - flat, no need to trent this Bilateral pleural effusion  Permanent atrial fibrillation on chronic anticoagulation  Tachy-brady syndrome status post pacemaker implantation  PAD Chronic Kidney disease stage IV Diabetes Mellitus  Dementia    For his large pericardial effusion will adjust his Lasix dose to see if this is going to help.  Will increase Lasix to 60 mg IV daily. unfortunately with his kidney disease we cannot use colchicine, if he does not respond to the Lasix we also have will consider pericardiocentesis, I will discuss with our interventional team.  Will plan for pericardiocentesis tomorrow.  Please keep him n.p.o. overnight.   He will benefit from nephrology evaluation as well given chronic kidney stage IV and diuretics use.  Diabetes mellitus managed by the primary team.  Troponin flat no angina no need to pursue any ischemic evaluation.     For questions or updates, please contact [facility] Please consult [email or web address] for contact info under Cardiology/STEMI.      Signed, Thomasene Ripple, DO  10/08/2022, 12:11 PM

## 2022-10-08 NOTE — Progress Notes (Signed)
Heart Failure Navigator Progress Note  Assessed for Heart & Vascular TOC clinic readiness.  Patient does not meet criteria due to EF 50-55%, per note patient with Alzheimer's Dementia. Has a cardiology appointment scheduled on 11/26/2022. .   Navigator will sign off at this time. Rhae Hammock, BSN, RN Heart Failure Teacher, adult education Only

## 2022-10-08 NOTE — Progress Notes (Signed)
Patients granddaughter called for update, update given and questions answered.   Patients granddaughter requested that the rounding physician for 5/21 am please call her cell phone (listed in contacts) and give update and plan for the day message on her voicemail. She is a Engineer, site and will be here this afternoon.

## 2022-10-08 NOTE — Progress Notes (Signed)
PROGRESS NOTE    [name]  ZOX:[id number] DOB: 1924-01-03 DOA: 10/07/2022 PCP: Mliss Sax, MD  Brief Narrative: [name] is a 89 y.o. male with medical history significant of hypertension, heart failure with preserved EF, atrial fibrillation, tachybradycardia s/p pacemaker, CVA, peripheral arterial disease, CKD stage IV, diabetes mellitus type 2 who presents with complaints of shortness of breath.  At baseline patient ambulates with use of a rollator or wheelchair and family reports that he has 24-hour care.  Over the last 3 days patient had reported having progressively worsening shortness of breath mostly with exertion, but sometimes at rest.  Patient denies any significant cough, fever, nausea, vomiting, or diarrhea symptoms.  He chronically has sputum production and grandson-in-law notes that sometimes the patient seems to get choked up with eating.  Patient had been in it back in March of this year due to pleural effusions requiring thoracentesis for which fluid was noted to be transudative with no malignant cells seen on cytology.  Family makes note that the patient also has a wound of his left great toe that is been present for several weeks now.  He has been followed by Dr. Lilian Kapur with podiatry in the outpatient setting.  Patient underwent ABIs with TBI's which noted resting left ankle-brachial index indicating moderate left lower extremity arterial disease with the left toe brachial index noted to be abnormal.  He was sent to Dr. Samuella Cota for peripheral artery disease with critical limb ischemia and gangrenous first toe.  Revascularization was recommended, but family was worried in regards to patient's age and comorbidities.   In the emergency department patient was noted to be afebrile with respirations 12-23, and all other vital signs maintained.  Labs significant for WBC 7, hemoglobin 9.6, BUN 28, creatinine 2.3, ionized calcium 1.11, and BNP 352.  Chest  x-ray noting increasing pleural effusions moderate to large on the left and small right effusion with adjacent opacity.  Patient has been given Lasix 40 mg IV x 1 dose.  Cardiology had been formally consulted.   Assessment & Plan:   Active Problems:   Acute on chronic diastolic CHF (congestive heart failure) ([name])   Bilateral pleural effusion   Atrial fibrillation ([name])   BRADYCARDIA-TACHYCARDIA SYNDROME s/p PPM   PPM-[employer]   Hypertension   PVD (peripheral vascular disease) ([name])   Gangrene of toe of left foot ([name])   Anemia due to chronic kidney disease   CKD (chronic kidney disease), stage IV ([name])   Type 2 diabetes mellitus without complication, without long-term current use of insulin ([name])   Alzheimer disease ([name])   History of CVA (cerebrovascular accident)   Benign prostatic hyperplasia with urinary obstruction   #1  Acute on chronic diastolic dysfunction -admitted with gradually worsening shortness of breath found to have bilateral pleural effusion left more than right treated with Lasix. Echocardiogram shows left ventricular ejection fraction 50 to 55% with no regional wall motion abnormalities severe LVH.  Mildly elevated pulmonary artery pressure.  Left atrium dilated large pericardial effusion there is no evidence of tamponade tamponade  BNP 352 Lasix 40 mg daily Creatinine 2.25 from 2.30 Cardiology to adjust the dose of Lasix Cardiology following and await their recommendations Npo after midnight for possible pericardiocentesis  #2 tachybradycardia syndrome status post pacemaker chronic atrial fibrillation on anticoagulation He is currently on heparin drip for in case of need of procedure?  If no procedure consider starting or oral anticoagulation he was taking prior to admission  #3 history  of CKD stage IV nephrology consulted  #4 tachybradycardia syndrome status post pacemaker  #5 PAD with left great toe gangrene surgical intervention was recommended Family  still deciding  #6 type 2 diabetes-CBG (last 3)  Recent Labs    10/07/22 1717 10/07/22 2223 10/08/22 1058  GLUCAP 89 169* 142*  Not on any medications at home continue to follow check A1c cover with sliding scale Last A1c 9.2 in February 2024  #7 history of stroke continue statin On Eliquis prior to admission currently on heparin drip for possible need of procedure  #8 history of BPH on Proscar and Flomax  #7 dementia continue Depakote and Namenda Continue supportive measures  #8 continue Xalatan eyedrops  #9 hypotension on midodrine 2.5 mg 3 times a day  Pressure Injury 07/09/22 Buttocks Stage 1 -  Intact skin with non-blanchable redness of a localized area usually over a bony prominence. (Active)  07/09/22 1745  Location: Buttocks  Location Orientation:   Staging: Stage 1 -  Intact skin with non-blanchable redness of a localized area usually over a bony prominence.  Wound Description (Comments):   Present on Admission: Yes    Estimated body mass index is 20.75 kg/m as calculated from the following:   Height as of this encounter: 5\' 11"  (1.803 m).   Weight as of this encounter: 67.5 kg.  DVT prophylaxis: heparin Code Status:dnr Family Communication:  Disposition Plan:  Status is: Inpatient Remains inpatient appropriate because: Pericardial effusion   Consultants: Cardiology , nephrology  Procedures: None Antimicrobials: None Subjective: Patient sitting in bed eating breakfast in no acute distress later today patient started complaining of worsening shortness of breath.  He had received 40 mg of Lasix already.  Objective: Vitals:   10/08/22 0418 10/08/22 0712 10/08/22 1014 10/08/22 1100  BP: 105/84 104/80  98/71  Pulse:  (!) 106  (!) 105  Resp:  19  17  Temp:  (!) 97.5 F (36.4 C)  97.6 F (36.4 C)  TempSrc:  Oral  Oral  SpO2: 95% 98% 98% 100%  Weight:      Height:        Intake/Output Summary (Last 24 hours) at 10/08/2022 1108 Last data filed at  10/08/2022 0025 Gross per 24 hour  Intake --  Output 1450 ml  Net -1450 ml   Filed Weights   10/07/22 1400 10/07/22 1900 10/08/22 0416  Weight: 69.3 kg 67.3 kg 67.5 kg    Examination:  General exam: Appears in no acute distress  respiratory system: Diminished to auscultation. Respiratory effort normal. Cardiovascular system: S1 & S2 heard, RRR. No JVD, murmurs, rubs, gallops or clicks. No pedal edema. Gastrointestinal system: Abdomen is nondistended, soft and nontender. No organomegaly or masses felt. Normal bowel sounds heard. Central nervous system: Alert and oriented. No focal neurological deficits. Extremities: No edema Data Reviewed: I have personally reviewed following labs and imaging studies  CBC: Recent Labs  Lab 10/07/22 1110 10/08/22 0112  WBC 7.0 7.2  NEUTROABS 5.3  --   HGB 9.6*  11.2* 10.2*  HCT 29.8*  33.0* 31.5*  MCV 79.0* 76.3*  PLT 162 162   Basic Metabolic Panel: Recent Labs  Lab 10/07/22 1110 10/08/22 0112  NA 139  142 138  K 4.4  4.4 4.5  CL 104  103 103  CO2 25 23  GLUCOSE 177*  175* 136*  BUN 27*  28* 31*  CREATININE 2.30*  2.30* 2.25*  CALCIUM 8.0* 8.3*  MG 1.8  --    GFR:  Estimated Creatinine Clearance: 18.3 mL/min (A) (by C-G formula based on SCr of 2.25 mg/dL (H)). Liver Function Tests: Recent Labs  Lab 10/07/22 1110  AST 15  ALT 9  ALKPHOS 68  BILITOT 0.4  PROT 6.2*  ALBUMIN 2.9*   No results for input(s): "LIPASE", "AMYLASE" in the last 168 hours. No results for input(s): "AMMONIA" in the last 168 hours. Coagulation Profile: No results for input(s): "INR", "PROTIME" in the last 168 hours. Cardiac Enzymes: No results for input(s): "CKTOTAL", "CKMB", "CKMBINDEX", "TROPONINI" in the last 168 hours. BNP (last 3 results) No results for input(s): "PROBNP" in the last 8760 hours. HbA1C: No results for input(s): "HGBA1C" in the last 72 hours. CBG: Recent Labs  Lab 10/07/22 1715 10/07/22 1717 10/07/22 2223  10/08/22 1058  GLUCAP 91 89 169* 142*   Lipid Profile: No results for input(s): "CHOL", "HDL", "LDLCALC", "TRIG", "CHOLHDL", "LDLDIRECT" in the last 72 hours. Thyroid Function Tests: No results for input(s): "TSH", "T4TOTAL", "FREET4", "T3FREE", "THYROIDAB" in the last 72 hours. Anemia Panel: No results for input(s): "VITAMINB12", "FOLATE", "FERRITIN", "TIBC", "IRON", "RETICCTPCT" in the last 72 hours. Sepsis Labs: No results for input(s): "PROCALCITON", "LATICACIDVEN" in the last 168 hours.  Recent Results (from the past 240 hour(s))  Resp panel by RT-PCR (RSV, Flu A&B, Covid) Anterior Nasal Swab     Status: None   Collection Time: 10/07/22 10:38 AM   Specimen: Anterior Nasal Swab  Result Value Ref Range Status   SARS Coronavirus 2 by RT PCR NEGATIVE NEGATIVE Final   Influenza A by PCR NEGATIVE NEGATIVE Final   Influenza B by PCR NEGATIVE NEGATIVE Final    Comment: (NOTE) The Xpert Xpress SARS-CoV-2/FLU/RSV plus assay is intended as an aid in the diagnosis of influenza from Nasopharyngeal swab specimens and should not be used as a sole basis for treatment. Nasal washings and aspirates are unacceptable for Xpert Xpress SARS-CoV-2/FLU/RSV testing.  Fact Sheet for Patients: BloggerCourse.com  Fact Sheet for Healthcare Providers: SeriousBroker.it  This test is not yet approved or cleared by the [place] FDA and has been authorized for detection and/or diagnosis of SARS-CoV-2 by FDA under an Emergency Use Authorization (EUA). This EUA will remain in effect (meaning this test can be used) for the duration of the COVID-19 declaration under Section 564(b)(1) of the Act, 21 U.S.C. section 360bbb-3(b)(1), unless the authorization is terminated or revoked.     Resp Syncytial Virus by PCR NEGATIVE NEGATIVE Final    Comment: (NOTE) Fact Sheet for Patients: BloggerCourse.com  Fact Sheet for Healthcare  Providers: SeriousBroker.it  This test is not yet approved or cleared by the [place] FDA and has been authorized for detection and/or diagnosis of SARS-CoV-2 by FDA under an Emergency Use Authorization (EUA). This EUA will remain in effect (meaning this test can be used) for the duration of the COVID-19 declaration under Section 564(b)(1) of the Act, 21 U.S.C. section 360bbb-3(b)(1), unless the authorization is terminated or revoked.  Performed at [facility], [address] [place], [place] [address]          Radiology Studies: DG Chest 2 View  Result Date: 10/08/2022 CLINICAL DATA:  Pleural effusion EXAM: CHEST - 2 VIEW COMPARISON:  10/07/2022 FINDINGS: Left-sided implanted cardiac device remains in place. Heart size appears enlarged although is partially obscured. Large left and small right pleural effusions, slightly increased. Increasing bibasilar airspace opacities. No pneumothorax. IMPRESSION: Large left and small right pleural effusions, slightly increased. Increasing bibasilar airspace opacities. Electronically Signed  By: Duanne Guess D.O.   On: 10/08/2022 08:31   CUP PACEART REMOTE DEVICE CHECK  Result Date: 10/07/2022 Scheduled remote reviewed. Normal device function.  11 VHR classified events, 4 sec. Next remote 91 days. MC, CVRS.  ECHOCARDIOGRAM COMPLETE  Result Date: 10/07/2022    ECHOCARDIOGRAM REPORT   Patient Name:   [name] [facility] Date of Exam: 10/07/2022 Medical Rec #:  [id number]             Height:       71.0 in Accession #:    [id number]            Weight:       152.8 lb Date of Birth:  07-02-1923            BSA:          1.881 m Patient Age:    89 years              BP:           119/85 mmHg Patient Gender: M                     HR:           96 bpm. Exam Location:  Inpatient Procedure: 2D Echo, Cardiac Doppler, Color Doppler and Intracardiac            Opacification Agent Indications:    Pericardial effusion  I31.3  History:        Patient has prior history of Echocardiogram examinations, most                 recent 07/07/2022. CHF, Stroke, Arrythmias:Atrial Fibrillation,                 Bradycardia and Tachycardia, Signs/Symptoms:Hypotension and                 Dyspnea; Risk Factors:Hypertension and Diabetes. CKD, stage 3.  Sonographer:    Lucendia Herrlich Referring Phys: [id number] RONDELL A SMITH  Sonographer Comments: Image acquisition challenging due to patient behavioral factors. IMPRESSIONS  1. Left ventricular ejection fraction, by estimation, is 50 to 55%. The left ventricle has low normal function. The left ventricle has no regional wall motion abnormalities. There is severe left ventricular hypertrophy. Left ventricular diastolic parameters are indeterminate.  2. Right ventricular systolic function is normal. The right ventricular size is normal. There is mildly elevated pulmonary artery systolic pressure.  3. Left atrial size was severely dilated.  4. Large pericardial effusion. There is no evidence of cardiac tamponade.  5. The mitral valve was not well visualized. Trivial mitral valve regurgitation. No evidence of mitral stenosis.  6. The aortic valve is tricuspid. There is mild calcification of the aortic valve. There is mild thickening of the aortic valve. Aortic valve regurgitation is trivial. Aortic valve sclerosis is present, with no evidence of aortic valve stenosis.  7. The inferior vena cava is dilated in size with >50% respiratory variability, suggesting right atrial pressure of 8 mmHg. Comparison(s): Changes from prior study are noted. Pericardial effusion is larger than prior, though no echo evidence of tamponade. FINDINGS  Left Ventricle: Left ventricular ejection fraction, by estimation, is 50 to 55%. The left ventricle has low normal function. The left ventricle has no regional wall motion abnormalities. Definity contrast agent was given IV to delineate the left ventricular endocardial borders.  The left ventricular internal cavity size was normal in size. There is severe left ventricular hypertrophy. Left ventricular diastolic  parameters are indeterminate. Right Ventricle: The right ventricular size is normal. Right vetricular wall thickness was not well visualized. Right ventricular systolic function is normal. There is mildly elevated pulmonary artery systolic pressure. The tricuspid regurgitant velocity  is 2.78 m/s, and with an assumed right atrial pressure of 8 mmHg, the estimated right ventricular systolic pressure is 38.9 mmHg. Left Atrium: Left atrial size was severely dilated. Right Atrium: Right atrial size was normal in size. Pericardium: Large pericardial effusion, maximum diameter 3.72 cm posterior to LV. Adherent material seen in pericardial fluid, but fluid generally low density. A large pericardial effusion is present. There is no evidence of cardiac tamponade. Mitral Valve: The mitral valve was not well visualized. Trivial mitral valve regurgitation. No evidence of mitral valve stenosis. Tricuspid Valve: The tricuspid valve is normal in structure. Tricuspid valve regurgitation is mild . No evidence of tricuspid stenosis. Aortic Valve: The aortic valve is tricuspid. There is mild calcification of the aortic valve. There is mild thickening of the aortic valve. Aortic valve regurgitation is trivial. Aortic valve sclerosis is present, with no evidence of aortic valve stenosis. Aortic valve peak gradient measures 2.6 mmHg. Pulmonic Valve: The pulmonic valve was not well visualized. Pulmonic valve regurgitation is not visualized. No evidence of pulmonic stenosis. Aorta: The aortic root, ascending aorta, aortic arch and descending aorta are all structurally normal, with no evidence of dilitation or obstruction. Venous: The inferior vena cava is dilated in size with greater than 50% respiratory variability, suggesting right atrial pressure of 8 mmHg. IAS/Shunts: The atrial septum is grossly  normal. Additional Comments: A device lead is visualized in the right ventricle and right atrium.  LEFT VENTRICLE PLAX 2D LVIDd:         3.60 cm   Diastology LVIDs:         2.70 cm   LV e' lateral:   10.70 cm/s LV PW:         1.30 cm   LV E/e' lateral: 4.9 LV IVS:        1.80 cm LVOT diam:     2.10 cm LV SV:         31 LV SV Index:   17 LVOT Area:     3.46 cm  RIGHT VENTRICLE             IVC RV S prime:     14.30 cm/s  IVC diam: 2.00 cm LEFT ATRIUM            Index        RIGHT ATRIUM           Index LA diam:      4.30 cm  2.29 cm/m   RA Area:     15.10 cm LA Vol (A2C): 89.7 ml  47.70 ml/m  RA Volume:   32.70 ml  17.39 ml/m LA Vol (A4C): 112.0 ml 59.56 ml/m  AORTIC VALVE AV Area (Vmax): 2.60 cm AV Vmax:        80.10 cm/s AV Peak Grad:   2.6 mmHg LVOT Vmax:      60.05 cm/s LVOT Vmean:     38.650 cm/s LVOT VTI:       0.091 m  AORTA Ao Root diam: 4.00 cm Ao Asc diam:  3.60 cm MITRAL VALVE               TRICUSPID VALVE MV Area (PHT): 6.54 cm    TR Peak grad:   30.9 mmHg MV Decel Time: 116 msec  TR Vmax:        278.00 cm/s MV E velocity: 52.00 cm/s MV A velocity: 35.50 cm/s  SHUNTS MV E/A ratio:  1.46        Systemic VTI:  0.09 m                            Systemic Diam: 2.10 cm Jodelle Red MD Electronically signed by Jodelle Red MD Signature Date/Time: 10/07/2022/7:54:10 PM    Final    DG Chest Port 1 View  Result Date: 10/07/2022 CLINICAL DATA:  Exertional dyspnea EXAM: PORTABLE CHEST 1 VIEW COMPARISON:  X-ray 07/25/2022 FINDINGS: Increasing now moderate to large left effusion. Increasing small right effusion. Adjacent opacity. Prominent central vasculature. Enlarged heart. Calcified aorta. No pneumothorax. Left upper chest pacemaker. Overlapping cardiac leads. Surgical clips in the upper abdomen. IMPRESSION: Increasing pleural effusions, moderate to large left and small right with the adjacent opacity. Vascular congestion. Electronically Signed   By: Karen Kays M.D.   On: 10/07/2022  11:07        Scheduled Meds:  divalproex  125 mg Oral BID   docusate sodium  100 mg Oral Daily   finasteride  5 mg Oral Daily   furosemide  40 mg Intravenous Daily   insulin aspart  0-6 Units Subcutaneous TID WC   latanoprost  1 drop Both Eyes QHS   melatonin  3 mg Oral QHS   memantine  10 mg Oral BID   midodrine  2.5 mg Oral TID WC   pantoprazole  40 mg Oral Daily   sodium chloride flush  3 mL Intravenous Q12H   tamsulosin  0.4 mg Oral Daily   Continuous Infusions:  heparin       LOS: 1 day   Time spent: 30 min  Alwyn Ren, MD 10/08/2022, 11:08 AM

## 2022-10-08 NOTE — Progress Notes (Signed)
ANTICOAGULATION CONSULT NOTE - Initial Consult  Pharmacy Consult for heparin Indication: atrial fibrillation  No Known Allergies  Patient Measurements: Height: 5\' 11"  (180.3 cm) Weight: 67.5 kg (148 lb 13 oz) IBW/kg (Calculated) : 75.3 Heparin Dosing Weight: 69.3 kg   Vital Signs: Temp: 97.5 F (36.4 C) (05/21 0712) Temp Source: Oral (05/21 0712) BP: 104/80 (05/21 0712) Pulse Rate: 106 (05/21 0712)  Labs: Recent Labs    10/07/22 1110 10/07/22 1249 10/08/22 0112 10/08/22 0745  HGB 9.6*  11.2*  --  10.2*  --   HCT 29.8*  33.0*  --  31.5*  --   PLT 162  --  162  --   APTT  --   --  53* 124*  HEPARINUNFRC  --   --  >1.10*  --   CREATININE 2.30*  2.30*  --  2.25*  --   TROPONINIHS 26* 27*  --   --      Estimated Creatinine Clearance: 18.3 mL/min (A) (by C-G formula based on SCr of 2.25 mg/dL (H)).   Medical History: Past Medical History:  Diagnosis Date   Acute CVA (cerebrovascular accident) [facility])    Arthritis    Atrial fibrillation ([name])    CKD (chronic kidney disease)    History of hiatal hernia    Hx SBO    Last 2013 all treated conservatively   Hypertension    Presence of permanent cardiac pacemaker    Prostate cancer ([name])    Stroke ([name]) 2017   left facial drooping noted 08/14/2016   Tachycardia-bradycardia syndrome ([name])    Type II diabetes mellitus ([name])    Ventral hernia    x3    Medications:  Patient is on eliquis 2.5 mg BID prior to admission for his Afib. His last dose was this morning, 5/20 at 0900.   Assessment: [place] is presenting today feeling short of breath. Pacemaker interrogation reveals runs of SVT. He will be admitted for further evaluation by cardiology.   Aptt came back elevated this AM. Levels have not correlated yet. No bleeding per Rn. We will hold then decrease rate.   Hgb 10.2, plt wnl  Goal of Therapy:  Heparin level 0.3-0.7 units/ml aPTT 66-102 seconds Monitor platelets by anticoagulation protocol: Yes   Plan:   Hold heparin x1 hr Decrease heparin infusion to 800 units/hr Check aptt in 8 hours and daily while on heparin Continue to monitor H&H and platelets  Ulyses Southward, PharmD, BCIDP, AAHIVP, CPP Infectious Disease Pharmacist 10/08/2022 10:19 AM

## 2022-10-08 NOTE — Consult Note (Signed)
   [facility] CM Inpatient Consult   10/08/2022  [name] 08-Jun-1923 [id number]  [facility] [[facility]]  Accountable Care Organization [ACO] Patient: [name], [[place] listed]  Primary Care Provider: Mliss Sax, MD with [facility] is listed to provide the Transition of Care follow up    Patient is currently showing as active with [facility] [[facility]] Care Management for chronic care management services.  Patient has been engaged by a [facility] RN CM.  Our community based plan of care has focused on disease management and community resource support.    Patient will receive a post hospital call and will be evaluated for assessments and disease process education.   On rounds patient was with medical staff.  Plan:  Will update the [facility] RN CM of admission and elicit any information needed for ongoing post hospital follow up needs. Continue to follow for progression and needs.  Will update in progression rounds the Inpatient Transition Of Care [TOC] team member to make aware that [facility] Care Management following.   Of note, [facility] Care Management services does not replace or interfere with any services that are needed or arranged by inpatient [facility] care management team.   For additional questions or referrals please contact:  Charlesetta Shanks, RN BSN CCM [facility]  [phone] business mobile phone Toll free office [phone]  *Concierge Line  [phone] Fax number: [phone] [email or web address]     .  Marland Kitchen

## 2022-10-08 NOTE — Consult Note (Signed)
Reason for Consult: Renal failure Referring Physician:  Dr. Blanchard Mane  Chief Complaint: Shortness of breath  Assessment/Plan: CKDIV w/ BL cr 1.9-2.4 range since mid 2024 not followed by nephrology given Lasix 40mg  on 5/20 with 1450 mL UOP /24hrs. Currently with a foley but no h/o BOO. - He is not a long term dialysis candidate given his age, functional status, and comorbidities. Son in law was bedside and agrees given the aggressive nature and life changing routine of dialysis. - However, he is dyspneic and if he develops renal failure requiring dialysis after the pericardiocentesis then will offer on a time limited trial. Ie 1-2 weeks to allow acute component of CIN a chance to recover. Of course with his advanced renal disease and limited reserve there is a chance he could become ESRD. But again, would not recommend long term dialysis given it would likely worsen his QOL.  - Will give another dose of lasix 40mg .   -Maintain MAP>65 for optimal renal perfusion.  - Avoid nephrotoxic medications including NSAIDs and iodinated intravenous contrast exposure unless the latter is absolutely indicated.   - Preferred narcotic agents for pain control are hydromorphone, fentanyl, and methadone. Morphine should not be used.  - Avoid Baclofen and avoid oral sodium phosphate and magnesium citrate based laxatives / bowel preps.  - Continue strict Input and Output monitoring. Will monitor the patient closely with you and intervene or adjust therapy as indicated by changes in clinical status/labs  Pericardial effusion - Large pericardial effusion, maximum diameter 3.72 cm  posterior to LV. Adherent material seen in pericardial fluid, but fluid  generally low density. Pericardial effusion is larger compared with prior echo but present  but no evidence of cardiac tamponade.  DM Atrial fibrillation on chronic anticoagulation Tachy-brady syndrome w/ PM Dementia   HPI: [name] is an 89  y.o. male HTN, HFpEF, afib, pacer for tachybrady syndrome, CVA, PAD, DM, CKDIV presenting w/ shortness of breath. He is rollator o wheelchair bound and now presenting with a three day history of progressively worsening shortness of breath. Patient denies fever, chills, nausea, vomiting but has a chronic cough. Patient required a thoracentesis in March which was transudative but no e/o malignancy. Patient with abnormal ankle-brachial index which was done because of a left great toe wound. Patient has had h/o an occluded left pop and TP trunk with retrograde access via the left PT artery.   In the ED he ws tachypneic, no white count with CXR revealing increasing pleural effusions L>R.   ROS Pertinent items are noted in HPI.  Chemistry and CBC: Creat  Date/Time Value Ref Range Status  08/03/2021 04:58 PM 1.50 (H) 0.70 - 1.22 mg/dL Final  40/98/1191 47:82 PM 1.93 (H) 0.70 - 1.11 mg/dL Final    Comment:    For patients >69 years of age, the reference limit for Creatinine is approximately 13% higher for people identified as African-American. .   04/10/2018 03:28 PM 1.45 (H) 0.70 - 1.11 mg/dL Final    Comment:    For patients >58 years of age, the reference limit for Creatinine is approximately 13% higher for people identified as African-American. .   07/30/2016 10:59 AM 1.58 (H) 0.70 - 1.11 mg/dL Final    Comment:      For patients > or = 86 years of age: The upper reference limit for Creatinine is approximately 13% higher for people identified as African-American.      Creatinine, Ser  Date/Time Value Ref Range Status  10/08/2022 01:12 AM 2.25 (H) 0.61 - 1.24 mg/dL Final  16/02/9603 54:09 AM 2.30 (H) 0.61 - 1.24 mg/dL Final  81/19/1478 29:56 AM 2.30 (H) 0.61 - 1.24 mg/dL Final  21/30/8657 84:69 PM 2.20 (H) 0.61 - 1.24 mg/dL Final  62/95/2841 32:44 AM 2.12 (H) 0.61 - 1.24 mg/dL Final  05/22/7251 66:44 AM 2.08 (H) 0.61 - 1.24 mg/dL Final  03/47/4259 56:38 AM 1.99 (H) 0.61 - 1.24  mg/dL Final  75/64/3329 51:88 AM 2.11 (H) 0.61 - 1.24 mg/dL Final  41/66/0630 16:01 AM 2.22 (H) 0.61 - 1.24 mg/dL Final  09/32/3557 32:20 AM 2.54 (H) 0.61 - 1.24 mg/dL Final  25/42/7062 37:62 PM 2.48 (H) 0.61 - 1.24 mg/dL Final  83/15/1761 60:73 AM 2.13 (H) 0.61 - 1.24 mg/dL Final  71/10/2692 85:46 AM 2.03 (H) 0.61 - 1.24 mg/dL Final  27/07/5007 38:18 AM 2.05 (H) 0.61 - 1.24 mg/dL Final  29/93/7169 67:89 AM 1.92 (H) 0.61 - 1.24 mg/dL Final  38/02/1750 02:58 PM 1.79 (H) 0.61 - 1.24 mg/dL Final  52/77/8242 35:36 PM 2.08 (H) 0.40 - 1.50 mg/dL Final  14/43/1540 08:67 PM 1.84 (H) 0.61 - 1.24 mg/dL Final  61/95/0932 67:12 PM 2.19 (H) 0.61 - 1.24 mg/dL Final  45/80/9983 38:25 AM 2.30 (H) 0.61 - 1.24 mg/dL Final  05/39/7673 41:93 AM 2.26 (H) 0.61 - 1.24 mg/dL Final  79/06/4095 35:32 AM 2.06 (H) 0.76 - 1.27 mg/dL Final  99/24/2683 41:96 PM 2.37 (H) 0.61 - 1.24 mg/dL Final  22/29/7989 21:19 AM 2.02 (H) 0.76 - 1.27 mg/dL Final  41/74/0814 48:18 AM 2.23 (H) 0.61 - 1.24 mg/dL Final  56/31/4970 26:37 PM 2.36 (H) 0.61 - 1.24 mg/dL Final  85/88/5027 74:12 AM 1.95 (H) 0.76 - 1.27 mg/dL Final  87/86/7672 09:47 AM 1.86 (H) 0.76 - 1.27 mg/dL Final  09/62/8366 29:47 PM 1.91 (H) 0.61 - 1.24 mg/dL Final  65/46/5035 46:56 AM 1.93 (H) 0.40 - 1.50 mg/dL Final  81/27/5170 01:74 AM 1.63 (H) 0.61 - 1.24 mg/dL Final  94/49/6759 16:38 AM 1.62 (H) 0.61 - 1.24 mg/dL Final  46/65/9935 70:17 AM 1.35 (H) 0.61 - 1.24 mg/dL Final  79/39/0300 92:33 AM 1.70 (H) 0.61 - 1.24 mg/dL Final  00/76/2263 33:54 PM 1.53 (H) 0.40 - 1.50 mg/dL Final  56/25/6389 37:34 AM 1.72 (H) 0.40 - 1.50 mg/dL Final  28/76/8115 72:62 AM 1.36 0.40 - 1.50 mg/dL Final  03/55/9741 63:84 PM 1.67 (H) 0.61 - 1.24 mg/dL Final  53/64/6803 21:22 AM 1.77 (H) 0.40 - 1.50 mg/dL Final  48/25/0037 04:88 PM 1.56 (H) 0.40 - 1.50 mg/dL Final  89/16/9450 38:88 AM 1.69 (H) 0.40 - 1.50 mg/dL Final  28/00/3491 79:15 AM 1.54 (H) 0.40 - 1.50 mg/dL Final  05/69/7948  01:65 PM 1.42 0.40 - 1.50 mg/dL Final  53/74/8270 78:67 AM 1.69 (H) 0.61 - 1.24 mg/dL Final  54/49/2010 07:12 AM 1.70 (H) 0.61 - 1.24 mg/dL Final  19/75/8832 54:98 AM 1.79 (H) 0.61 - 1.24 mg/dL Final  26/41/5830 94:07 AM 1.74 (H) 0.61 - 1.24 mg/dL Final  68/12/8108 31:59 PM 2.03 (H) 0.61 - 1.24 mg/dL Final  45/85/9292 44:62 AM 1.44 0.40 - 1.50 mg/dL Final  86/38/1771 16:57 AM 1.55 (H) 0.40 - 1.50 mg/dL Final  90/38/3338 32:91 AM 1.41 (H) 0.61 - 1.24 mg/dL Final  91/66/0600 45:99 AM 1.38 (H) 0.61 - 1.24 mg/dL Final   Recent Labs  Lab 10/07/22 1110 10/08/22 0112  NA 139  142 138  K 4.4  4.4 4.5  CL 104  103 103  CO2  25 23  GLUCOSE 177*  175* 136*  BUN 27*  28* 31*  CREATININE 2.30*  2.30* 2.25*  CALCIUM 8.0* 8.3*   Recent Labs  Lab 10/07/22 1110 10/08/22 0112  WBC 7.0 7.2  NEUTROABS 5.3  --   HGB 9.6*  11.2* 10.2*  HCT 29.8*  33.0* 31.5*  MCV 79.0* 76.3*  PLT 162 162   Liver Function Tests: Recent Labs  Lab 10/07/22 1110  AST 15  ALT 9  ALKPHOS 68  BILITOT 0.4  PROT 6.2*  ALBUMIN 2.9*   No results for input(s): "LIPASE", "AMYLASE" in the last 168 hours. No results for input(s): "AMMONIA" in the last 168 hours. Cardiac Enzymes: No results for input(s): "CKTOTAL", "CKMB", "CKMBINDEX", "TROPONINI" in the last 168 hours. Iron Studies: No results for input(s): "IRON", "TIBC", "TRANSFERRIN", "FERRITIN" in the last 72 hours. PT/INR: @LABRCNTIP (inr:5)  Xrays/Other Studies: ) Results for orders placed or performed during the hospital encounter of 10/07/22 (from the past 48 hour(s))  Resp panel by RT-PCR (RSV, Flu A&B, Covid) Anterior Nasal Swab     Status: None   Collection Time: 10/07/22 10:38 AM   Specimen: Anterior Nasal Swab  Result Value Ref Range   SARS Coronavirus 2 by RT PCR NEGATIVE NEGATIVE   Influenza A by PCR NEGATIVE NEGATIVE   Influenza B by PCR NEGATIVE NEGATIVE    Comment: (NOTE) The Xpert Xpress SARS-CoV-2/FLU/RSV plus assay is intended  as an aid in the diagnosis of influenza from Nasopharyngeal swab specimens and should not be used as a sole basis for treatment. Nasal washings and aspirates are unacceptable for Xpert Xpress SARS-CoV-2/FLU/RSV testing.  Fact Sheet for Patients: BloggerCourse.com  Fact Sheet for Healthcare Providers: SeriousBroker.it  This test is not yet approved or cleared by the [place] FDA and has been authorized for detection and/or diagnosis of SARS-CoV-2 by FDA under an Emergency Use Authorization (EUA). This EUA will remain in effect (meaning this test can be used) for the duration of the COVID-19 declaration under Section 564(b)(1) of the Act, 21 U.S.C. section 360bbb-3(b)(1), unless the authorization is terminated or revoked.     Resp Syncytial Virus by PCR NEGATIVE NEGATIVE    Comment: (NOTE) Fact Sheet for Patients: BloggerCourse.com  Fact Sheet for Healthcare Providers: SeriousBroker.it  This test is not yet approved or cleared by the [place] FDA and has been authorized for detection and/or diagnosis of SARS-CoV-2 by FDA under an Emergency Use Authorization (EUA). This EUA will remain in effect (meaning this test can be used) for the duration of the COVID-19 declaration under Section 564(b)(1) of the Act, 21 U.S.C. section 360bbb-3(b)(1), unless the authorization is terminated or revoked.  Performed at [facility], [address] [place], [place] [address]   Comprehensive metabolic panel     Status: Abnormal   Collection Time: 10/07/22 11:10 AM  Result Value Ref Range   Sodium 139 135 - 145 mmol/L   Potassium 4.4 3.5 - 5.1 mmol/L   Chloride 104 98 - 111 mmol/L   CO2 25 22 - 32 mmol/L   Glucose, Bld 177 (H) 70 - 99 mg/dL    Comment: Glucose reference range applies only to samples taken after fasting for at least 8 hours.   BUN 27 (H) 8 - 23 mg/dL    Creatinine, Ser 6.04 (H) 0.61 - 1.24 mg/dL   Calcium 8.0 (L) 8.9 - 10.3 mg/dL   Total Protein 6.2 (L) 6.5 - 8.1 g/dL   Albumin 2.9 (L) 3.5 -  5.0 g/dL   AST 15 15 - 41 U/L   ALT 9 0 - 44 U/L   Alkaline Phosphatase 68 38 - 126 U/L   Total Bilirubin 0.4 0.3 - 1.2 mg/dL   GFR, Estimated 25 (L) >60 mL/min    Comment: (NOTE) Calculated using the CKD-EPI Creatinine Equation (2021)    Anion gap 10 5 - 15    Comment: Performed at [facility], [address] [place], [place] [address]  I-Stat Chem 8, ED     Status: Abnormal   Collection Time: 10/07/22 11:10 AM  Result Value Ref Range   Sodium 142 135 - 145 mmol/L   Potassium 4.4 3.5 - 5.1 mmol/L   Chloride 103 98 - 111 mmol/L   BUN 28 (H) 8 - 23 mg/dL   Creatinine, Ser 4.25 (H) 0.61 - 1.24 mg/dL   Glucose, Bld 956 (H) 70 - 99 mg/dL    Comment: Glucose reference range applies only to samples taken after fasting for at least 8 hours.   Calcium, Ion 1.11 (L) 1.15 - 1.40 mmol/L   TCO2 27 22 - 32 mmol/L   Hemoglobin 11.2 (L) 13.0 - 17.0 g/dL   HCT 38.7 (L) 56.4 - 33.2 %  CBC with Differential/Platelet     Status: Abnormal   Collection Time: 10/07/22 11:10 AM  Result Value Ref Range   WBC 7.0 4.0 - 10.5 K/uL   RBC 3.77 (L) 4.22 - 5.81 MIL/uL   Hemoglobin 9.6 (L) 13.0 - 17.0 g/dL   HCT 95.1 (L) 88.4 - 16.6 %   MCV 79.0 (L) 80.0 - 100.0 fL   MCH 25.5 (L) 26.0 - 34.0 pg   MCHC 32.2 30.0 - 36.0 g/dL   RDW 06.3 (H) 01.6 - 01.0 %   Platelets 162 150 - 400 K/uL    Comment: REPEATED TO VERIFY   nRBC 0.0 0.0 - 0.2 %   Neutrophils Relative % 75 %   Neutro Abs 5.3 1.7 - 7.7 K/uL   Lymphocytes Relative 11 %   Lymphs Abs 0.8 0.7 - 4.0 K/uL   Monocytes Relative 12 %   Monocytes Absolute 0.8 0.1 - 1.0 K/uL   Eosinophils Relative 1 %   Eosinophils Absolute 0.0 0.0 - 0.5 K/uL   Basophils Relative 1 %   Basophils Absolute 0.0 0.0 - 0.1 K/uL   Immature Granulocytes 0 %   Abs Immature Granulocytes 0.03 0.00 - 0.07 K/uL    Comment: Performed  at [facility], [address], [place], [place] [address]  Brain natriuretic peptide     Status: Abnormal   Collection Time: 10/07/22 11:10 AM  Result Value Ref Range   B Natriuretic Peptide 352.0 (H) 0.0 - 100.0 pg/mL    Comment: Performed at [facility], [address] [place], [place] [address]  Magnesium     Status: None   Collection Time: 10/07/22 11:10 AM  Result Value Ref Range   Magnesium 1.8 1.7 - 2.4 mg/dL    Comment: Performed at [facility], [address], [place], [place] [address]  Troponin I (High Sensitivity)     Status: Abnormal   Collection Time: 10/07/22 11:10 AM  Result Value Ref Range   Troponin I (High Sensitivity) 26 (H) <18 ng/L    Comment: (NOTE) Elevated high sensitivity troponin I (hsTnI) values and significant  changes across serial measurements may suggest ACS but many other  chronic and acute conditions are known to elevate  hsTnI results.  Refer to the "Links" section for chest pain algorithms and additional  guidance. Performed at [facility], [address] [place] [address] [place], [place] [address]   Troponin I (High Sensitivity)     Status: Abnormal   Collection Time: 10/07/22 12:49 PM  Result Value Ref Range   Troponin I (High Sensitivity) 27 (H) <18 ng/L    Comment: (NOTE) Elevated high sensitivity troponin I (hsTnI) values and significant  changes across serial measurements may suggest ACS but many other  chronic and acute conditions are known to elevate hsTnI results.  Refer to the "Links" section for chest pain algorithms and additional  guidance. Performed at [facility], [address] [place], [place] [address]   CBG monitoring, ED     Status: None   Collection Time: 10/07/22  5:15 PM  Result Value Ref Range   Glucose-Capillary 91 70 - 99 mg/dL    Comment: Glucose reference range applies only to samples taken after fasting for at least 8 hours.  CBG monitoring, ED     Status: None   Collection Time: 10/07/22   5:17 PM  Result Value Ref Range   Glucose-Capillary 89 70 - 99 mg/dL    Comment: Glucose reference range applies only to samples taken after fasting for at least 8 hours.  Glucose, capillary     Status: Abnormal   Collection Time: 10/07/22 10:23 PM  Result Value Ref Range   Glucose-Capillary 169 (H) 70 - 99 mg/dL    Comment: Glucose reference range applies only to samples taken after fasting for at least 8 hours.  CBC     Status: Abnormal   Collection Time: 10/08/22  1:12 AM  Result Value Ref Range   WBC 7.2 4.0 - 10.5 K/uL   RBC 4.13 (L) 4.22 - 5.81 MIL/uL   Hemoglobin 10.2 (L) 13.0 - 17.0 g/dL   HCT 09.8 (L) 11.9 - 14.7 %   MCV 76.3 (L) 80.0 - 100.0 fL   MCH 24.7 (L) 26.0 - 34.0 pg   MCHC 32.4 30.0 - 36.0 g/dL   RDW 82.9 (H) 56.2 - 13.0 %   Platelets 162 150 - 400 K/uL    Comment: REPEATED TO VERIFY   nRBC 0.0 0.0 - 0.2 %    Comment: Performed at [facility], [address], [place], [place] [address]  Basic metabolic panel     Status: Abnormal   Collection Time: 10/08/22  1:12 AM  Result Value Ref Range   Sodium 138 135 - 145 mmol/L   Potassium 4.5 3.5 - 5.1 mmol/L   Chloride 103 98 - 111 mmol/L   CO2 23 22 - 32 mmol/L   Glucose, Bld 136 (H) 70 - 99 mg/dL    Comment: Glucose reference range applies only to samples taken after fasting for at least 8 hours.   BUN 31 (H) 8 - 23 mg/dL   Creatinine, Ser 4.69 (H) 0.61 - 1.24 mg/dL   Calcium 8.3 (L) 8.9 - 10.3 mg/dL   GFR, Estimated 26 (L) >60 mL/min    Comment: (NOTE) Calculated using the CKD-EPI Creatinine Equation (2021)    Anion gap 12 5 - 15    Comment: Performed at [facility], [address] [place], [place] [address]  Heparin level (unfractionated)     Status: Abnormal   Collection Time: 10/08/22  1:12 AM  Result Value Ref Range   Heparin Unfractionated >1.10 (H) 0.30 - 0.70 IU/mL  Comment: (NOTE) The clinical reportable range upper limit is being lowered to >1.10 to align with the FDA approved  guidance for the current laboratory assay.  If heparin results are below expected values, and patient dosage has  been confirmed, suggest follow up testing of antithrombin III levels. Performed at [facility], [address] [place], [place] [address]   APTT     Status: Abnormal   Collection Time: 10/08/22  1:12 AM  Result Value Ref Range   aPTT 53 (H) 24 - 36 seconds    Comment:        IF BASELINE aPTT IS ELEVATED, SUGGEST PATIENT RISK ASSESSMENT BE USED TO DETERMINE APPROPRIATE ANTICOAGULANT THERAPY. Performed at [facility], [address] [place], [place] [address]   APTT     Status: Abnormal   Collection Time: 10/08/22  7:45 AM  Result Value Ref Range   aPTT 124 (H) 24 - 36 seconds    Comment:        IF BASELINE aPTT IS ELEVATED, SUGGEST PATIENT RISK ASSESSMENT BE USED TO DETERMINE APPROPRIATE ANTICOAGULANT THERAPY. Performed at [facility], [address] [place], [place] [address]   Glucose, capillary     Status: Abnormal   Collection Time: 10/08/22 10:58 AM  Result Value Ref Range   Glucose-Capillary 142 (H) 70 - 99 mg/dL    Comment: Glucose reference range applies only to samples taken after fasting for at least 8 hours.   DG Chest 2 View  Result Date: 10/08/2022 CLINICAL DATA:  Pleural effusion EXAM: CHEST - 2 VIEW COMPARISON:  10/07/2022 FINDINGS: Left-sided implanted cardiac device remains in place. Heart size appears enlarged although is partially obscured. Large left and small right pleural effusions, slightly increased. Increasing bibasilar airspace opacities. No pneumothorax. IMPRESSION: Large left and small right pleural effusions, slightly increased. Increasing bibasilar airspace opacities. Electronically Signed   By: Duanne Guess D.O.   On: 10/08/2022 08:31   CUP PACEART REMOTE DEVICE CHECK  Result Date: 10/07/2022 Scheduled remote reviewed. Normal device function.  11 VHR classified events, 4 sec. Next remote 91 days. MC,  CVRS.  ECHOCARDIOGRAM COMPLETE  Result Date: 10/07/2022    ECHOCARDIOGRAM REPORT   Patient Name:   NAZEER TINKER [facility] Date of Exam: 10/07/2022 Medical Rec #:  [id number]             Height:       71.0 in Accession #:    [id number]            Weight:       152.8 lb Date of Birth:  [birth year]            BSA:          1.881 m Patient Age:    89 years              BP:           119/85 mmHg Patient Gender: M                     HR:           96 bpm. Exam Location:  Inpatient Procedure: 2D Echo, Cardiac Doppler, Color Doppler and Intracardiac            Opacification Agent Indications:    Pericardial effusion I31.3  History:        Patient has prior history of Echocardiogram examinations, most  recent 07/07/2022. CHF, Stroke, Arrythmias:Atrial Fibrillation,                 Bradycardia and Tachycardia, Signs/Symptoms:Hypotension and                 Dyspnea; Risk Factors:Hypertension and Diabetes. CKD, stage 3.  Sonographer:    Lucendia Herrlich Referring Phys: [id number] RONDELL A SMITH  Sonographer Comments: Image acquisition challenging due to patient behavioral factors. IMPRESSIONS  1. Left ventricular ejection fraction, by estimation, is 50 to 55%. The left ventricle has low normal function. The left ventricle has no regional wall motion abnormalities. There is severe left ventricular hypertrophy. Left ventricular diastolic parameters are indeterminate.  2. Right ventricular systolic function is normal. The right ventricular size is normal. There is mildly elevated pulmonary artery systolic pressure.  3. Left atrial size was severely dilated.  4. Large pericardial effusion. There is no evidence of cardiac tamponade.  5. The mitral valve was not well visualized. Trivial mitral valve regurgitation. No evidence of mitral stenosis.  6. The aortic valve is tricuspid. There is mild calcification of the aortic valve. There is mild thickening of the aortic valve. Aortic valve regurgitation is trivial. Aortic  valve sclerosis is present, with no evidence of aortic valve stenosis.  7. The inferior vena cava is dilated in size with >50% respiratory variability, suggesting right atrial pressure of 8 mmHg. Comparison(s): Changes from prior study are noted. Pericardial effusion is larger than prior, though no echo evidence of tamponade. FINDINGS  Left Ventricle: Left ventricular ejection fraction, by estimation, is 50 to 55%. The left ventricle has low normal function. The left ventricle has no regional wall motion abnormalities. Definity contrast agent was given IV to delineate the left ventricular endocardial borders. The left ventricular internal cavity size was normal in size. There is severe left ventricular hypertrophy. Left ventricular diastolic parameters are indeterminate. Right Ventricle: The right ventricular size is normal. Right vetricular wall thickness was not well visualized. Right ventricular systolic function is normal. There is mildly elevated pulmonary artery systolic pressure. The tricuspid regurgitant velocity  is 2.78 m/s, and with an assumed right atrial pressure of 8 mmHg, the estimated right ventricular systolic pressure is 38.9 mmHg. Left Atrium: Left atrial size was severely dilated. Right Atrium: Right atrial size was normal in size. Pericardium: Large pericardial effusion, maximum diameter 3.72 cm posterior to LV. Adherent material seen in pericardial fluid, but fluid generally low density. A large pericardial effusion is present. There is no evidence of cardiac tamponade. Mitral Valve: The mitral valve was not well visualized. Trivial mitral valve regurgitation. No evidence of mitral valve stenosis. Tricuspid Valve: The tricuspid valve is normal in structure. Tricuspid valve regurgitation is mild . No evidence of tricuspid stenosis. Aortic Valve: The aortic valve is tricuspid. There is mild calcification of the aortic valve. There is mild thickening of the aortic valve. Aortic valve regurgitation  is trivial. Aortic valve sclerosis is present, with no evidence of aortic valve stenosis. Aortic valve peak gradient measures 2.6 mmHg. Pulmonic Valve: The pulmonic valve was not well visualized. Pulmonic valve regurgitation is not visualized. No evidence of pulmonic stenosis. Aorta: The aortic root, ascending aorta, aortic arch and descending aorta are all structurally normal, with no evidence of dilitation or obstruction. Venous: The inferior vena cava is dilated in size with greater than 50% respiratory variability, suggesting right atrial pressure of 8 mmHg. IAS/Shunts: The atrial septum is grossly normal. Additional Comments: A device lead is visualized in the right ventricle  and right atrium.  LEFT VENTRICLE PLAX 2D LVIDd:         3.60 cm   Diastology LVIDs:         2.70 cm   LV e' lateral:   10.70 cm/s LV PW:         1.30 cm   LV E/e' lateral: 4.9 LV IVS:        1.80 cm LVOT diam:     2.10 cm LV SV:         31 LV SV Index:   17 LVOT Area:     3.46 cm  RIGHT VENTRICLE             IVC RV S prime:     14.30 cm/s  IVC diam: 2.00 cm LEFT ATRIUM            Index        RIGHT ATRIUM           Index LA diam:      4.30 cm  2.29 cm/m   RA Area:     15.10 cm LA Vol (A2C): 89.7 ml  47.70 ml/m  RA Volume:   32.70 ml  17.39 ml/m LA Vol (A4C): 112.0 ml 59.56 ml/m  AORTIC VALVE AV Area (Vmax): 2.60 cm AV Vmax:        80.10 cm/s AV Peak Grad:   2.6 mmHg LVOT Vmax:      60.05 cm/s LVOT Vmean:     38.650 cm/s LVOT VTI:       0.091 m  AORTA Ao Root diam: 4.00 cm Ao Asc diam:  3.60 cm MITRAL VALVE               TRICUSPID VALVE MV Area (PHT): 6.54 cm    TR Peak grad:   30.9 mmHg MV Decel Time: 116 msec    TR Vmax:        278.00 cm/s MV E velocity: 52.00 cm/s MV A velocity: 35.50 cm/s  SHUNTS MV E/A ratio:  1.46        Systemic VTI:  0.09 m                            Systemic Diam: 2.10 cm Jodelle Red MD Electronically signed by Jodelle Red MD Signature Date/Time: 10/07/2022/7:54:10 PM    Final    DG  Chest Port 1 View  Result Date: 10/07/2022 CLINICAL DATA:  Exertional dyspnea EXAM: PORTABLE CHEST 1 VIEW COMPARISON:  X-ray 07/25/2022 FINDINGS: Increasing now moderate to large left effusion. Increasing small right effusion. Adjacent opacity. Prominent central vasculature. Enlarged heart. Calcified aorta. No pneumothorax. Left upper chest pacemaker. Overlapping cardiac leads. Surgical clips in the upper abdomen. IMPRESSION: Increasing pleural effusions, moderate to large left and small right with the adjacent opacity. Vascular congestion. Electronically Signed   By: Karen Kays M.D.   On: 10/07/2022 11:07    PMH:   Past Medical History:  Diagnosis Date   Acute CVA (cerebrovascular accident) ([name])    Arthritis    Atrial fibrillation ([name])    CKD (chronic kidney disease)    History of hiatal hernia    Hx SBO    Last 2013 all treated conservatively   Hypertension    Presence of permanent cardiac pacemaker    Prostate cancer ([name])    Stroke ([name]) 2017   left facial drooping noted 08/14/2016   Tachycardia-bradycardia syndrome ([name])    Type II  diabetes mellitus ([name])    Ventral hernia    x3    PSH:   Past Surgical History:  Procedure Laterality Date   ABDOMINAL AORTOGRAM W/LOWER EXTREMITY N/A 08/07/2016   Procedure: Abdominal Aortogram w/Lower Extremity;  Surgeon: Iran Ouch, MD;  Location: [facility];  Service: Cardiovascular;  Laterality: N/A;   CHOLECYSTECTOMY OPEN  03/31/2001   Dr Wiliam Ke   COLON SURGERY     EXPLORATORY LAPAROTOMY  08/2004   Hattie Perch 10/01/2010   HEMORRHOID SURGERY     HERNIA REPAIR     HIATAL HERNIA REPAIR  2002   INCISIONAL HERNIA REPAIR  08/2004   Hattie Perch 10/01/2010   INSERT / REPLACE / REMOVE PACEMAKER     IR THORACENTESIS ASP PLEURAL SPACE W/IMG GUIDE  07/23/2022   IR THORACENTESIS ASP PLEURAL SPACE W/IMG GUIDE  07/24/2022   IR THORACENTESIS ASP PLEURAL SPACE W/IMG GUIDE  07/25/2022   PERIPHERAL VASCULAR BALLOON ANGIOPLASTY  08/07/2016   Procedure:  Peripheral Vascular Balloon Angioplasty;  Surgeon: Iran Ouch, MD;  Location: [facility];  Service: Cardiovascular;;  left popliteal artery   PERIPHERAL VASCULAR INTERVENTION  08/14/2016   popliteal artery/notes 08/14/2016   PERIPHERAL VASCULAR INTERVENTION Left 08/14/2016   Procedure: Peripheral Vascular Intervention;  Surgeon: Iran Ouch, MD;  Location: [facility];  Service: Cardiovascular;  Laterality: Left;  popliteal artery   PERMANENT PACEMAKER GENERATOR CHANGE N/A 04/06/2012   Procedure: PERMANENT PACEMAKER GENERATOR CHANGE;  Surgeon: Marinus Maw, MD;  Location: [facility] CATH LAB;  Service: Cardiovascular;  Laterality: N/A;   PPM GENERATOR CHANGEOUT N/A 02/04/2022   Procedure: PPM GENERATOR CHANGEOUT;  Surgeon: Marinus Maw, MD;  Location: [facility] INVASIVE CV LAB;  Service: Cardiovascular;  Laterality: N/A;   SMALL INTESTINE SURGERY  09/16/2004   SBO resection, VH repair    Allergies: No Known Allergies  Medications:   Prior to Admission medications   Medication Sig Start Date End Date Taking? Authorizing Provider  apixaban (ELIQUIS) 2.5 MG TABS tablet Take 1 tablet (2.5 mg total) by mouth 2 (two) times daily. 07/29/22  Yes Azucena Fallen, MD  divalproex (DEPAKOTE SPRINKLE) 125 MG capsule Take 1 capsule (125 mg total) by mouth 2 (two) times daily. 05/27/22 07/22/23 Yes Mliss Sax, MD  docusate sodium (COLACE) 100 MG capsule Take 1 capsule (100 mg total) by mouth daily. 07/10/22  Yes Amin, Ankit Chirag, MD  finasteride (PROSCAR) 5 MG tablet TAKE 1 TABLET (5 MG TOTAL) BY MOUTH DAILY. 06/07/22  Yes Mliss Sax, MD  furosemide (LASIX) 20 MG tablet Take 20 mg by mouth daily.   Yes [provider]  Iron, Ferrous Sulfate, 325 (65 Fe) MG TABS Take 1 tablet every other day. Patient taking differently: Take 1 tablet by mouth every other day. 06/13/22  Yes Mliss Sax, MD  latanoprost (XALATAN) 0.005 % ophthalmic solution Place 1 drop  into both eyes at bedtime. 05/03/20  Yes [provider]  loratadine (CLARITIN) 10 MG tablet Take 10 mg by mouth daily as needed for rhinitis.   Yes [provider]  Melatonin 3 MG TBDP Take 1 tablet by mouth at bedtime. Patient taking differently: Take 3 mg by mouth at bedtime. 07/10/22  Yes Amin, Ankit Chirag, MD  memantine (NAMENDA) 10 MG tablet Take 1 tablet (10 mg total) by mouth 2 (two) times daily. 08/22/22  Yes Micki Riley, MD  midodrine (PROAMATINE) 2.5 MG tablet Take 1 tablet (2.5 mg total) by mouth  3 (three) times daily with meals. 07/29/22  Yes Azucena Fallen, MD  pantoprazole (PROTONIX) 40 MG tablet TAKE 1 TABLET(40 MG) BY MOUTH DAILY Patient taking differently: Take 40 mg by mouth daily. 01/01/22  Yes Mliss Sax, MD  tamsulosin (FLOMAX) 0.4 MG CAPS capsule Take 1 capsule (0.4 mg total) by mouth daily. 12/04/21  Yes Mliss Sax, MD  atorvastatin (LIPITOR) 10 MG tablet atorvastatin 10 mg tablet TAKE 1 TABLET BY MOUTH ONCE DAILY AT BEDTIME Patient not taking: Reported on 10/07/2022 05/09/20   [provider]  furosemide (LASIX) 40 MG tablet Take 1 tablet (40 mg total) by mouth daily. Patient not taking: Reported on 10/07/2022 07/30/22   Azucena Fallen, MD  metoprolol succinate (TOPROL-XL) 25 MG 24 hr tablet TAKE 1 TAB EVERY EVENING Patient not taking: Reported on 10/07/2022 11/25/21   [provider]    Discontinued Meds:   Medications Discontinued During This Encounter  Medication Reason   enoxaparin (LOVENOX) injection 30 mg    heparin ADULT infusion 100 units/mL (25000 units/275mL)     Social History:  reports that he quit smoking about 67 years ago. His smoking use included cigarettes. He has a 22.50 pack-year smoking history. He has been exposed to tobacco smoke. He quit smokeless tobacco use about 70 years ago.  His smokeless tobacco use included chew. He reports that he does not drink alcohol and does not use  drugs.  Family History:   Family History  Problem Relation Age of Onset   Pneumonia Father    Stroke Brother    Stroke Sister    Cancer Brother        type unknown    Blood pressure 98/71, pulse (!) 105, temperature 97.6 F (36.4 C), temperature source Oral, resp. rate 17, height 5\' 11"  (1.803 m), weight 67.5 kg, SpO2 100 %. General appearance: alert, cooperative, and appears stated age Head: Normocephalic, without obvious abnormality, atraumatic Neck: no adenopathy, no carotid bruit, supple, symmetrical, trachea midline, and thyroid not enlarged, symmetric, no tenderness/mass/nodules Back: symmetric, no curvature. ROM normal. No CVA tenderness. Resp: clear to auscultation bilaterally Cardio: regular rate and rhythm GI: soft, non-tender; bowel sounds normal; no masses,  no organomegaly Extremities: edema tr-1+ and lt GT plantar surface dry gangrene Pulses: Unable to palpate lt DP Skin: Skin color, texture, turgor normal. No rashes or lesions       Catherene Kaleta, Len Blalock, MD 10/08/2022, 12:41 PM

## 2022-10-08 NOTE — Progress Notes (Signed)
ANTICOAGULATION CONSULT NOTE - Initial Consult  Pharmacy Consult for heparin Indication: atrial fibrillation  No Known Allergies  Patient Measurements: Height: 5\' 11"  (180.3 cm) Weight: 67.5 kg (148 lb 13 oz) IBW/kg (Calculated) : 75.3 Heparin Dosing Weight: 67.5 kg  Vital Signs: Temp: 97.9 F (36.6 C) (05/21 1951) Temp Source: Oral (05/21 1951) BP: 124/82 (05/21 1951) Pulse Rate: 115 (05/21 2000)  Labs: Recent Labs    10/07/22 1110 10/07/22 1249 10/08/22 0112 10/08/22 0745 10/08/22 1830  HGB 9.6*  11.2*  --  10.2*  --   --   HCT 29.8*  33.0*  --  31.5*  --   --   PLT 162  --  162  --   --   APTT  --   --  53* 124* 47*  HEPARINUNFRC  --   --  >1.10*  --   --   CREATININE 2.30*  2.30*  --  2.25*  --   --   TROPONINIHS 26* 27*  --   --   --      Estimated Creatinine Clearance: 18.3 mL/min (A) (by C-G formula based on SCr of 2.25 mg/dL (H)).   Medical History: Past Medical History:  Diagnosis Date   Acute CVA (cerebrovascular accident) [facility])    Arthritis    Atrial fibrillation ([name])    CKD (chronic kidney disease)    History of hiatal hernia    Hx SBO    Last 2013 all treated conservatively   Hypertension    Presence of permanent cardiac pacemaker    Prostate cancer ([name])    Stroke ([name]) 2017   left facial drooping noted 08/14/2016   Tachycardia-bradycardia syndrome ([name])    Type II diabetes mellitus ([name])    Ventral hernia    x3    Medications:  Patient is on eliquis 2.5 mg BID prior to admission for his Afib. His last dose was this morning, 5/20 at 0900.   Assessment: [name] is presenting today feeling short of breath. Pacemaker interrogation reveals runs of SVT. He will be admitted for further evaluation by cardiology.   aPTT subtherapeutic at 47 seconds after rate decrease this AM. Night shift RN noted line had been beeping during her shift however lab was drawn towards the end of day shift so likely accurate. Will bump up heparin slightly and  recheck aPTT in 8 h.  Goal of Therapy:  Heparin level 0.3-0.7 units/ml aPTT 66-102 seconds Monitor platelets by anticoagulation protocol: Yes   Plan:  Increase heparin infusion to 900 units/hr Check aptt in 8 hours and daily while on heparin Continue to monitor H&H and platelets  Rexford Maus, PharmD, BCPS 10/08/2022 8:35 PM

## 2022-10-09 ENCOUNTER — Other Ambulatory Visit: Payer: Self-pay | Admitting: Neurology

## 2022-10-09 ENCOUNTER — Encounter (HOSPITAL_COMMUNITY): Payer: Self-pay | Admitting: Interventional Cardiology

## 2022-10-09 ENCOUNTER — Encounter (HOSPITAL_COMMUNITY)
Admission: EM | Disposition: A | Discharge status: Home Health | Payer: Self-pay | Source: Home / Self Care | Attending: Internal Medicine

## 2022-10-09 ENCOUNTER — Other Ambulatory Visit (HOSPITAL_COMMUNITY): Payer: No Typology Code available for payment source

## 2022-10-09 ENCOUNTER — Inpatient Hospital Stay (HOSPITAL_COMMUNITY): Payer: No Typology Code available for payment source

## 2022-10-09 DIAGNOSIS — I10 Essential (primary) hypertension: Secondary | ICD-10-CM | POA: Diagnosis not present

## 2022-10-09 DIAGNOSIS — I739 Peripheral vascular disease, unspecified: Secondary | ICD-10-CM

## 2022-10-09 DIAGNOSIS — F03911 Unspecified dementia, unspecified severity, with agitation: Secondary | ICD-10-CM

## 2022-10-09 DIAGNOSIS — I3139 Other pericardial effusion (noninflammatory): Secondary | ICD-10-CM | POA: Diagnosis not present

## 2022-10-09 DIAGNOSIS — N184 Chronic kidney disease, stage 4 (severe): Secondary | ICD-10-CM

## 2022-10-09 DIAGNOSIS — E119 Type 2 diabetes mellitus without complications: Secondary | ICD-10-CM

## 2022-10-09 DIAGNOSIS — I4891 Unspecified atrial fibrillation: Secondary | ICD-10-CM

## 2022-10-09 DIAGNOSIS — I5033 Acute on chronic diastolic (congestive) heart failure: Secondary | ICD-10-CM | POA: Diagnosis not present

## 2022-10-09 HISTORY — PX: PERICARDIOCENTESIS: CATH118255

## 2022-10-09 LAB — CBC
HCT: 30 % — ABNORMAL LOW (ref 39.0–52.0)
HCT: 30.3 % — ABNORMAL LOW (ref 39.0–52.0)
Hemoglobin: 9.9 g/dL — ABNORMAL LOW (ref 13.0–17.0)
Hemoglobin: 10.1 g/dL — ABNORMAL LOW (ref 13.0–17.0)
MCH: 25.2 pg — ABNORMAL LOW (ref 26.0–34.0)
MCH: 25.4 pg — ABNORMAL LOW (ref 26.0–34.0)
MCHC: 33 g/dL (ref 30.0–36.0)
MCHC: 33.3 g/dL (ref 30.0–36.0)
MCV: 76.3 fL — ABNORMAL LOW (ref 80.0–100.0)
MCV: 76.3 fL — ABNORMAL LOW (ref 80.0–100.0)
Platelets: 169 10*3/uL (ref 150–400)
Platelets: 167 10*3/uL (ref 150–400)
RBC: 3.93 MIL/uL — ABNORMAL LOW (ref 4.22–5.81)
RBC: 3.97 MIL/uL — ABNORMAL LOW (ref 4.22–5.81)
RDW: 17.5 % — ABNORMAL HIGH (ref 11.5–15.5)
RDW: 17.9 % — ABNORMAL HIGH (ref 11.5–15.5)
WBC: 7.1 10*3/uL (ref 4.0–10.5)
WBC: 6.5 10*3/uL (ref 4.0–10.5)
nRBC: 0 % (ref 0.0–0.2)
nRBC: 0 % (ref 0.0–0.2)

## 2022-10-09 LAB — COMPREHENSIVE METABOLIC PANEL
ALT: 8 U/L (ref 0–44)
AST: 14 U/L — ABNORMAL LOW (ref 15–41)
Albumin: 2.6 g/dL — ABNORMAL LOW (ref 3.5–5.0)
Alkaline Phosphatase: 66 U/L (ref 38–126)
Anion gap: 9 (ref 5–15)
BUN: 30 mg/dL — ABNORMAL HIGH (ref 8–23)
CO2: 24 mmol/L (ref 22–32)
Calcium: 8 mg/dL — ABNORMAL LOW (ref 8.9–10.3)
Chloride: 103 mmol/L (ref 98–111)
Creatinine, Ser: 2.3 mg/dL — ABNORMAL HIGH (ref 0.61–1.24)
GFR, Estimated: 25 mL/min — ABNORMAL LOW (ref >60)
Glucose, Bld: 129 mg/dL — ABNORMAL HIGH (ref 70–99)
Potassium: 4.4 mmol/L (ref 3.5–5.1)
Sodium: 136 mmol/L (ref 135–145)
Total Bilirubin: 0.8 mg/dL (ref 0.3–1.2)
Total Protein: 5.6 g/dL — ABNORMAL LOW (ref 6.5–8.1)

## 2022-10-09 LAB — BODY FLUID CELL COUNT WITH DIFFERENTIAL
Eos, Fluid: 0 %
Lymphs, Fluid: 7 %
Monocyte-Macrophage-Serous Fluid: 17 % — ABNORMAL LOW (ref 50–90)
Neutrophil Count, Fluid: 76 % — ABNORMAL HIGH (ref 0–25)
Total Nucleated Cell Count, Fluid: 1045 cu mm — ABNORMAL HIGH (ref 0–1000)

## 2022-10-09 LAB — GRAM STAIN

## 2022-10-09 LAB — HEMOGLOBIN A1C
Hgb A1c MFr Bld: 7.2 % — ABNORMAL HIGH (ref 4.8–5.6)
Mean Plasma Glucose: 159.94 mg/dL

## 2022-10-09 LAB — ECHOCARDIOGRAM LIMITED
Height: 71 in
Weight: 2331.58 oz

## 2022-10-09 LAB — GLUCOSE, CAPILLARY
Glucose-Capillary: 131 mg/dL — ABNORMAL HIGH (ref 70–99)
Glucose-Capillary: 93 mg/dL (ref 70–99)
Glucose-Capillary: 75 mg/dL (ref 70–99)
Glucose-Capillary: 70 mg/dL (ref 70–99)
Glucose-Capillary: 175 mg/dL — ABNORMAL HIGH (ref 70–99)

## 2022-10-09 LAB — HEPARIN LEVEL (UNFRACTIONATED): Heparin Unfractionated: >1.1 IU/mL — ABNORMAL HIGH (ref 0.30–0.70)

## 2022-10-09 LAB — APTT
aPTT: 109 seconds — ABNORMAL HIGH (ref 24–36)
aPTT: 110 seconds — ABNORMAL HIGH (ref 24–36)

## 2022-10-09 SURGERY — PERICARDIOCENTESIS
Anesthesia: LOCAL

## 2022-10-09 MED ORDER — SODIUM CHLORIDE 0.9% FLUSH
3.0000 mL | Freq: Two times a day (BID) | INTRAVENOUS | Status: DC
  Administered 2022-10-09 – 2022-10-12 (×8): 3 mL via INTRAVENOUS

## 2022-10-09 MED ORDER — SODIUM CHLORIDE 0.9 % IV SOLN: 250.0000 mL | INTRAVENOUS | Status: DC | PRN

## 2022-10-09 MED ORDER — FENTANYL CITRATE (PF) 100 MCG/2ML IJ SOLN
INTRAMUSCULAR | Status: AC
  Filled 2022-10-09: qty 2

## 2022-10-09 MED ORDER — HEPARIN (PORCINE) IN NACL 1000-0.9 UT/500ML-% IV SOLN
INTRAVENOUS | Status: DC | PRN
  Administered 2022-10-09: 500 mL

## 2022-10-09 MED ORDER — HYDRALAZINE HCL 20 MG/ML IJ SOLN: 10.0000 mg | INTRAMUSCULAR | Status: AC | PRN

## 2022-10-09 MED ORDER — METOPROLOL TARTRATE 25 MG PO TABS
25.0000 mg | ORAL_TABLET | Freq: Two times a day (BID) | ORAL | Status: DC
  Administered 2022-10-09 – 2022-10-10 (×2): 25 mg via ORAL
  Filled 2022-10-09 (×4): qty 1

## 2022-10-09 MED ORDER — LIDOCAINE HCL (PF) 1 % IJ SOLN
INTRAMUSCULAR | Status: DC | PRN
  Administered 2022-10-09: 15 mL

## 2022-10-09 MED ORDER — FENTANYL CITRATE (PF) 100 MCG/2ML IJ SOLN
INTRAMUSCULAR | Status: DC | PRN
  Administered 2022-10-09: 12.5 ug via INTRAVENOUS

## 2022-10-09 MED ORDER — HEPARIN SODIUM (PORCINE) 5000 UNIT/ML IJ SOLN
5000.0000 [IU] | Freq: Three times a day (TID) | INTRAMUSCULAR | Status: DC
  Administered 2022-10-09 – 2022-10-11 (×5): 5000 [IU] via SUBCUTANEOUS
  Filled 2022-10-09 (×5): qty 1

## 2022-10-09 MED ORDER — SODIUM CHLORIDE 0.9% FLUSH: 3.0000 mL | INTRAVENOUS | Status: DC | PRN

## 2022-10-09 MED ORDER — ONDANSETRON HCL 4 MG/2ML IJ SOLN: 4.0000 mg | Freq: Four times a day (QID) | INTRAMUSCULAR | Status: DC | PRN

## 2022-10-09 MED ORDER — CHLORHEXIDINE GLUCONATE CLOTH 2 % EX PADS
6.0000 | MEDICATED_PAD | Freq: Every day | CUTANEOUS | Status: DC
  Administered 2022-10-09 – 2022-10-16 (×8): 6 via TOPICAL

## 2022-10-09 MED ORDER — SODIUM CHLORIDE 0.9 % IV SOLN: INTRAVENOUS | Status: DC

## 2022-10-09 MED ORDER — LABETALOL HCL 5 MG/ML IV SOLN: 10.0000 mg | INTRAVENOUS | Status: AC | PRN

## 2022-10-09 MED ORDER — ACETAMINOPHEN 325 MG PO TABS: 650.0000 mg | ORAL_TABLET | ORAL | Status: DC | PRN

## 2022-10-09 SURGICAL SUPPLY — 5 items
ELECT DEFIB PAD ADLT CADENCE (PAD) IMPLANT
PACK CARDIAC CATHETERIZATION (CUSTOM PROCEDURE TRAY) IMPLANT
SHEATH PROBE COVER 6X72 (BAG) IMPLANT
TRANSDUCER W/STOPCOCK (MISCELLANEOUS) IMPLANT
TRAY PERICARDIOCENTESIS 6FX60 (TRAY / TRAY PROCEDURE) IMPLANT

## 2022-10-09 NOTE — Assessment & Plan Note (Signed)
Continue blood pressure support.

## 2022-10-09 NOTE — H&P (View-Only) (Signed)
Progress Note  Patient Name: Mali Juaire Date of Encounter: 10/09/2022  Primary Cardiologist: None   Subjective   Patient seen examined his bedside.  He experiences shortness of breath slightly improved from prior.  Inpatient Medications    Scheduled Meds:  divalproex  125 mg Oral BID   docusate sodium  100 mg Oral Daily   finasteride  5 mg Oral Daily   furosemide  40 mg Intravenous Daily   insulin aspart  0-5 Units Subcutaneous QHS   insulin aspart  0-6 Units Subcutaneous TID WC   insulin aspart  0-6 Units Subcutaneous TID WC   latanoprost  1 drop Both Eyes QHS   melatonin  3 mg Oral QHS   memantine  10 mg Oral BID   midodrine  2.5 mg Oral TID WC   pantoprazole  40 mg Oral Daily   sodium chloride flush  3 mL Intravenous Q12H   tamsulosin  0.4 mg Oral Daily   Continuous Infusions:  sodium chloride 10 mL/hr at 10/09/22 0557   heparin 800 Units/hr (10/09/22 0231)   PRN Meds: acetaminophen **OR** acetaminophen, albuterol, mouth rinse   Vital Signs    Vitals:   10/08/22 2000 10/09/22 0050 10/09/22 0413 10/09/22 0823  BP:  118/86 115/83 110/76  Pulse: (!) 115 (!) 109 98 99  Resp: 19 17 19 18   Temp:  97.8 F (36.6 C) 97.9 F (36.6 C) 97.6 F (36.4 C)  TempSrc:  Oral Oral Oral  SpO2: 96% 98% 98% 100%  Weight:   66.1 kg   Height:        Intake/Output Summary (Last 24 hours) at 10/09/2022 0843 Last data filed at 10/09/2022 0700 Gross per 24 hour  Intake 157.82 ml  Output 850 ml  Net -692.18 ml   Filed Weights   10/07/22 1900 10/08/22 0416 10/09/22 0413  Weight: 67.3 kg 67.5 kg 66.1 kg    Telemetry     - Personally Reviewed  ECG     - Personally Reviewed  Physical Exam     General:NAD Head: Atraumatic, normal size  Eyes: PEERLA, EOMI  Neck: Supple, normal JVD Cardiac: Normal S1, S2; RRR; no murmurs, rubs, or gallops Lungs: Clear to auscultation bilaterally Abd: Soft, nontender, no hepatomegaly  Ext: warm, no edema Musculoskeletal:  No deformities, BUE and BLE strength normal and equal Skin: Warm and dry, no rashes   Neuro: Alert and oriented to person, place,. Psych: Normal mood and affect   Labs    Chemistry Recent Labs  Lab 10/07/22 1110 10/08/22 0112 10/09/22 0043  NA 139  142 138 136  K 4.4  4.4 4.5 4.4  CL 104  103 103 103  CO2 25 23 24   GLUCOSE 177*  175* 136* 129*  BUN 27*  28* 31* 30*  CREATININE 2.30*  2.30* 2.25* 2.30*  CALCIUM 8.0* 8.3* 8.0*  PROT 6.2*  --  5.6*  ALBUMIN 2.9*  --  2.6*  AST 15  --  14*  ALT 9  --  8  ALKPHOS 68  --  66  BILITOT 0.4  --  0.8  GFRNONAA 25* 26* 25*  ANIONGAP 10 12 9      Hematology Recent Labs  Lab 10/08/22 0112 10/09/22 0043 10/09/22 0703  WBC 7.2 7.1 6.5  RBC 4.13* 3.93* 3.97*  HGB 10.2* 9.9* 10.1*  HCT 31.5* 30.0* 30.3*  MCV 76.3* 76.3* 76.3*  MCH 24.7* 25.2* 25.4*  MCHC 32.4 33.0 33.3  RDW 17.9* 17.5* 17.9*  PLT 162 169 167    Cardiac EnzymesNo results for input(s): "TROPONINI" in the last 168 hours. No results for input(s): "TROPIPOC" in the last 168 hours.   BNP Recent Labs  Lab 10/07/22 1110  BNP 352.0*     DDimer No results for input(s): "DDIMER" in the last 168 hours.   Radiology    DG Chest 2 View  Result Date: 10/08/2022 CLINICAL DATA:  Pleural effusion EXAM: CHEST - 2 VIEW COMPARISON:  10/07/2022 FINDINGS: Left-sided implanted cardiac device remains in place. Heart size appears enlarged although is partially obscured. Large left and small right pleural effusions, slightly increased. Increasing bibasilar airspace opacities. No pneumothorax. IMPRESSION: Large left and small right pleural effusions, slightly increased. Increasing bibasilar airspace opacities. Electronically Signed   By: Duanne Guess D.O.   On: 10/08/2022 08:31   ECHOCARDIOGRAM COMPLETE  Result Date: 10/07/2022    ECHOCARDIOGRAM REPORT   Patient Name:   MARCELLOUS VELTMAN [facility] Date of Exam: 10/07/2022 Medical Rec #:  [id number]             Height:        71.0 in Accession #:    [id number]            Weight:       152.8 lb Date of Birth:  [birth year]            BSA:          1.881 m Patient Age:    89 years              BP:           119/85 mmHg Patient Gender: M                     HR:           96 bpm. Exam Location:  Inpatient Procedure: 2D Echo, Cardiac Doppler, Color Doppler and Intracardiac            Opacification Agent Indications:    Pericardial effusion I31.3  History:        Patient has prior history of Echocardiogram examinations, most                 recent 07/07/2022. CHF, Stroke, Arrythmias:Atrial Fibrillation,                 Bradycardia and Tachycardia, Signs/Symptoms:Hypotension and                 Dyspnea; Risk Factors:Hypertension and Diabetes. CKD, stage 3.  Sonographer:    Lucendia Herrlich Referring Phys: [id number] RONDELL A SMITH  Sonographer Comments: Image acquisition challenging due to patient behavioral factors. IMPRESSIONS  1. Left ventricular ejection fraction, by estimation, is 50 to 55%. The left ventricle has low normal function. The left ventricle has no regional wall motion abnormalities. There is severe left ventricular hypertrophy. Left ventricular diastolic parameters are indeterminate.  2. Right ventricular systolic function is normal. The right ventricular size is normal. There is mildly elevated pulmonary artery systolic pressure.  3. Left atrial size was severely dilated.  4. Large pericardial effusion. There is no evidence of cardiac tamponade.  5. The mitral valve was not well visualized. Trivial mitral valve regurgitation. No evidence of mitral stenosis.  6. The aortic valve is tricuspid. There is mild calcification of the aortic valve. There is mild thickening of the aortic valve. Aortic valve regurgitation is trivial. Aortic valve sclerosis is present, with no evidence of aortic valve stenosis.  7. The inferior vena cava is dilated in size with >50% respiratory variability, suggesting right atrial pressure of 8 mmHg.  Comparison(s): Changes from prior study are noted. Pericardial effusion is larger than prior, though no echo evidence of tamponade. FINDINGS  Left Ventricle: Left ventricular ejection fraction, by estimation, is 50 to 55%. The left ventricle has low normal function. The left ventricle has no regional wall motion abnormalities. Definity contrast agent was given IV to delineate the left ventricular endocardial borders. The left ventricular internal cavity size was normal in size. There is severe left ventricular hypertrophy. Left ventricular diastolic parameters are indeterminate. Right Ventricle: The right ventricular size is normal. Right vetricular wall thickness was not well visualized. Right ventricular systolic function is normal. There is mildly elevated pulmonary artery systolic pressure. The tricuspid regurgitant velocity  is 2.78 m/s, and with an assumed right atrial pressure of 8 mmHg, the estimated right ventricular systolic pressure is 38.9 mmHg. Left Atrium: Left atrial size was severely dilated. Right Atrium: Right atrial size was normal in size. Pericardium: Large pericardial effusion, maximum diameter 3.72 cm posterior to LV. Adherent material seen in pericardial fluid, but fluid generally low density. A large pericardial effusion is present. There is no evidence of cardiac tamponade. Mitral Valve: The mitral valve was not well visualized. Trivial mitral valve regurgitation. No evidence of mitral valve stenosis. Tricuspid Valve: The tricuspid valve is normal in structure. Tricuspid valve regurgitation is mild . No evidence of tricuspid stenosis. Aortic Valve: The aortic valve is tricuspid. There is mild calcification of the aortic valve. There is mild thickening of the aortic valve. Aortic valve regurgitation is trivial. Aortic valve sclerosis is present, with no evidence of aortic valve stenosis. Aortic valve peak gradient measures 2.6 mmHg. Pulmonic Valve: The pulmonic valve was not well visualized.  Pulmonic valve regurgitation is not visualized. No evidence of pulmonic stenosis. Aorta: The aortic root, ascending aorta, aortic arch and descending aorta are all structurally normal, with no evidence of dilitation or obstruction. Venous: The inferior vena cava is dilated in size with greater than 50% respiratory variability, suggesting right atrial pressure of 8 mmHg. IAS/Shunts: The atrial septum is grossly normal. Additional Comments: A device lead is visualized in the right ventricle and right atrium.  LEFT VENTRICLE PLAX 2D LVIDd:         3.60 cm   Diastology LVIDs:         2.70 cm   LV e' lateral:   10.70 cm/s LV PW:         1.30 cm   LV E/e' lateral: 4.9 LV IVS:        1.80 cm LVOT diam:     2.10 cm LV SV:         31 LV SV Index:   17 LVOT Area:     3.46 cm  RIGHT VENTRICLE             IVC RV S prime:     14.30 cm/s  IVC diam: 2.00 cm LEFT ATRIUM            Index        RIGHT ATRIUM           Index LA diam:      4.30 cm  2.29 cm/m   RA Area:     15.10 cm LA Vol (A2C): 89.7 ml  47.70 ml/m  RA Volume:   32.70 ml  17.39 ml/m LA Vol (A4C): 112.0 ml 59.56 ml/m  AORTIC  VALVE AV Area (Vmax): 2.60 cm AV Vmax:        80.10 cm/s AV Peak Grad:   2.6 mmHg LVOT Vmax:      60.05 cm/s LVOT Vmean:     38.650 cm/s LVOT VTI:       0.091 m  AORTA Ao Root diam: 4.00 cm Ao Asc diam:  3.60 cm MITRAL VALVE               TRICUSPID VALVE MV Area (PHT): 6.54 cm    TR Peak grad:   30.9 mmHg MV Decel Time: 116 msec    TR Vmax:        278.00 cm/s MV E velocity: 52.00 cm/s MV A velocity: 35.50 cm/s  SHUNTS MV E/A ratio:  1.46        Systemic VTI:  0.09 m                            Systemic Diam: 2.10 cm Jodelle Red MD Electronically signed by Jodelle Red MD Signature Date/Time: 10/07/2022/7:54:10 PM    Final    DG Chest Port 1 View  Result Date: 10/07/2022 CLINICAL DATA:  Exertional dyspnea EXAM: PORTABLE CHEST 1 VIEW COMPARISON:  X-ray 07/25/2022 FINDINGS: Increasing now moderate to large left effusion.  Increasing small right effusion. Adjacent opacity. Prominent central vasculature. Enlarged heart. Calcified aorta. No pneumothorax. Left upper chest pacemaker. Overlapping cardiac leads. Surgical clips in the upper abdomen. IMPRESSION: Increasing pleural effusions, moderate to large left and small right with the adjacent opacity. Vascular congestion. Electronically Signed   By: Karen Kays M.D.   On: 10/07/2022 11:07    Cardiac Studies   TTE 10/07/2022 IMPRESSIONS     1. Left ventricular ejection fraction, by estimation, is 50 to 55%. The  left ventricle has low normal function. The left ventricle has no regional  wall motion abnormalities. There is severe left ventricular hypertrophy.  Left ventricular diastolic  parameters are indeterminate.   2. Right ventricular systolic function is normal. The right ventricular  size is normal. There is mildly elevated pulmonary artery systolic  pressure.   3. Left atrial size was severely dilated.   4. Large pericardial effusion. There is no evidence of cardiac tamponade.   5. The mitral valve was not well visualized. Trivial mitral valve  regurgitation. No evidence of mitral stenosis.   6. The aortic valve is tricuspid. There is mild calcification of the  aortic valve. There is mild thickening of the aortic valve. Aortic valve  regurgitation is trivial. Aortic valve sclerosis is present, with no  evidence of aortic valve stenosis.   7. The inferior vena cava is dilated in size with >50% respiratory  variability, suggesting right atrial pressure of 8 mmHg.   Comparison(s): Changes from prior study are noted. Pericardial effusion is  larger than prior, though no echo evidence of tamponade.   FINDINGS   Left Ventricle: Left ventricular ejection fraction, by estimation, is 50  to 55%. The left ventricle has low normal function. The left ventricle has  no regional wall motion abnormalities. Definity contrast agent was given  IV to delineate the  left  ventricular endocardial borders. The left ventricular internal cavity size  was normal in size. There is severe left ventricular hypertrophy. Left  ventricular diastolic parameters are indeterminate.   Right Ventricle: The right ventricular size is normal. Right vetricular  wall thickness was not well visualized. Right ventricular systolic  function is  normal. There is mildly elevated pulmonary artery systolic  pressure. The tricuspid regurgitant velocity   is 2.78 m/s, and with an assumed right atrial pressure of 8 mmHg, the  estimated right ventricular systolic pressure is 38.9 mmHg.   Left Atrium: Left atrial size was severely dilated.   Right Atrium: Right atrial size was normal in size.   Pericardium: Large pericardial effusion, maximum diameter 3.72 cm  posterior to LV. Adherent material seen in pericardial fluid, but fluid  generally low density. A large pericardial effusion is present. There is  no evidence of cardiac tamponade.   Mitral Valve: The mitral valve was not well visualized. Trivial mitral  valve regurgitation. No evidence of mitral valve stenosis.   Tricuspid Valve: The tricuspid valve is normal in structure. Tricuspid  valve regurgitation is mild . No evidence of tricuspid stenosis.   Aortic Valve: The aortic valve is tricuspid. There is mild calcification  of the aortic valve. There is mild thickening of the aortic valve. Aortic  valve regurgitation is trivial. Aortic valve sclerosis is present, with no  evidence of aortic valve  stenosis. Aortic valve peak gradient measures 2.6 mmHg.   Pulmonic Valve: The pulmonic valve was not well visualized. Pulmonic valve  regurgitation is not visualized. No evidence of pulmonic stenosis.   Aorta: The aortic root, ascending aorta, aortic arch and descending aorta  are all structurally normal, with no evidence of dilitation or  obstruction.   Venous: The inferior vena cava is dilated in size with greater than  50%  respiratory variability, suggesting right atrial pressure of 8 mmHg.   IAS/Shunts: The atrial septum is grossly normal.   Patient Profile     89 y.o. male  CVA, atrial fibrillation, Type II diabetes mellitus, peripheral artery disease endovascular intervention of the occluded left popliteal artery and TP trunk using a retrograde access via the left posterior tibial artery, symptomatic bradycardia status post pacemaker implantation, chronic kidney disease   Assessment & Plan      Large pericardial effusion Shortness of breath  Elevated trop - flat, no need to trent this Bilateral pleural effusion  Permanent atrial fibrillation on chronic anticoagulation  Tachy-brady syndrome status post pacemaker implantation  PAD Chronic Kidney disease stage IV Diabetes Mellitus  Dementia    Social breath, large pericardial effusion on echo which is improved than prior.  No tamponade features seen.  He has been given Lasix over the last couple of days.  He will benefit from pericardiocentesis given symptoms  The patient has  DPR Judithann Sauger [phone]), who understands that risks include but are not limited to stroke (1 in 1000), death (1 in 1000), kidney failure [usually temporary] (1 in 500), bleeding (1 in 200), allergic reaction [possibly serious] (1 in 200), and agrees to proceed with the pericardiocentesis.    Unfortunately with his kidney disease we cannot use colchicine, if he does not respond to the Lasix we also have will consider pericardiocentesis, I will discuss with our interventional team.    Nephrology evaluation as well given chronic kidney stage IV and diuretics use.  Diabetes mellitus managed by the primary team.  Troponin flat no angina no need to pursue any ischemic evaluation.    For questions or updates, please contact [facility] Please consult [email or web address] for contact info under Cardiology/STEMI.      SignedThomasene Ripple, DO  10/09/2022, 8:43 AM

## 2022-10-09 NOTE — Assessment & Plan Note (Signed)
Indicated intervention, but his family has not decided yet.

## 2022-10-09 NOTE — Assessment & Plan Note (Addendum)
Echocardiogram with preserved LV systolic function with EF 50 to 55%, severe LVH, RVSP 38.9. LA with severe dilatation. Large pericardial effusion.   Documented urine output is 1000  ml. His fluid balance is negative since admission 3,999 ml.  Blood pressure has been low with systolic in th 80's with MAP 63 to 70.   Discontinue furosemide. Add 250 cc IV isotonic saline bolus.  Midodrine dose has been increased to 10 mg bid.  Limited pharmacologic therapy due to reduced GFR.

## 2022-10-09 NOTE — Assessment & Plan Note (Signed)
Continue midodrine for blood pressure support.  ?

## 2022-10-09 NOTE — Hospital Course (Addendum)
Mr. Boulware was hospitalized with the working diagnosis of heart failure decompensation.   89 y.o. male with medical history significant of hypertension, heart failure with preserved EF, atrial fibrillation, tachybradycardia s/p pacemaker, CVA, peripheral arterial disease, CKD stage IV, diabetes mellitus type 2 who presents with complaints of shortness of breath.  At baseline patient ambulates with use of a rollator or wheelchair and family reports that he has 24-hour care.  Over the last 3 days patient had reported having progressively worsening shortness of breath mostly with exertion, but sometimes at rest.    In the emergency department patient was noted to be afebrile with respirations 12-23, and all other vital signs maintained. Lungs with no wheezing or rhonchi, heart with S1 and S2 present irregularly irregular with no gallops or murmur, abdomen with no distention and positive lower extremity edema. Left foot great toes with necrotic tissue.   Labs significant for WBC 7, hemoglobin 9.6, BUN 28,  creatinine 2.3, ionized calcium 1.11, and BNP 352.    Chest radiograph with cardiomegaly, bilateral hilar vascular congestion, bilateral pleural effusions more left than right. Pacemaker in place with one lead in the right atrium and one in the right ventricle.   Patient has been given IV furosemide. Cardiology had been formally consulted.  Echocardiogram with large pericardial effusion with signs of tamponade. 05/22 pericardiocentesis 1 L removed and catheter left in place.  05/23 continue pericardiocentesis catheter in place.  05/24 pericardiocentesis drain has been,

## 2022-10-09 NOTE — Progress Notes (Signed)
 Progress Note  Patient Name: [name] Date of Encounter: 10/09/2022  Primary Cardiologist: None   Subjective   Patient seen examined his bedside.  He experiences shortness of breath slightly improved from prior.  Inpatient Medications    Scheduled Meds:  divalproex  125 mg Oral BID   docusate sodium  100 mg Oral Daily   finasteride  5 mg Oral Daily   furosemide  40 mg Intravenous Daily   insulin aspart  0-5 Units Subcutaneous QHS   insulin aspart  0-6 Units Subcutaneous TID WC   insulin aspart  0-6 Units Subcutaneous TID WC   latanoprost  1 drop Both Eyes QHS   melatonin  3 mg Oral QHS   memantine  10 mg Oral BID   midodrine  2.5 mg Oral TID WC   pantoprazole  40 mg Oral Daily   sodium chloride flush  3 mL Intravenous Q12H   tamsulosin  0.4 mg Oral Daily   Continuous Infusions:  sodium chloride 10 mL/hr at 10/09/22 0557   heparin 800 Units/hr (10/09/22 0231)   PRN Meds: acetaminophen **OR** acetaminophen, albuterol, mouth rinse   Vital Signs    Vitals:   10/08/22 2000 10/09/22 0050 10/09/22 0413 10/09/22 0823  BP:  118/86 115/83 110/76  Pulse: (!) 115 (!) 109 98 99  Resp: 19 17 19 18  Temp:  97.8 F (36.6 C) 97.9 F (36.6 C) 97.6 F (36.4 C)  TempSrc:  Oral Oral Oral  SpO2: 96% 98% 98% 100%  Weight:   66.1 kg   Height:        Intake/Output Summary (Last 24 hours) at 10/09/2022 0843 Last data filed at 10/09/2022 0700 Gross per 24 hour  Intake 157.82 ml  Output 850 ml  Net -692.18 ml   Filed Weights   10/07/22 1900 10/08/22 0416 10/09/22 0413  Weight: 67.3 kg 67.5 kg 66.1 kg    Telemetry     - Personally Reviewed  ECG     - Personally Reviewed  Physical Exam     General:NAD Head: Atraumatic, normal size  Eyes: PEERLA, EOMI  Neck: Supple, normal JVD Cardiac: Normal S1, S2; RRR; no murmurs, rubs, or gallops Lungs: Clear to auscultation bilaterally Abd: Soft, nontender, no hepatomegaly  Ext: warm, no edema Musculoskeletal:  No deformities, BUE and BLE strength normal and equal Skin: Warm and dry, no rashes   Neuro: Alert and oriented to person, place,. Psych: Normal mood and affect   Labs    Chemistry Recent Labs  Lab 10/07/22 1110 10/08/22 0112 10/09/22 0043  NA 139  142 138 136  K 4.4  4.4 4.5 4.4  CL 104  103 103 103  CO2 25 23 24  GLUCOSE 177*  175* 136* 129*  BUN 27*  28* 31* 30*  CREATININE 2.30*  2.30* 2.25* 2.30*  CALCIUM 8.0* 8.3* 8.0*  PROT 6.2*  --  5.6*  ALBUMIN 2.9*  --  2.6*  AST 15  --  14*  ALT 9  --  8  ALKPHOS 68  --  66  BILITOT 0.4  --  0.8  GFRNONAA 25* 26* 25*  ANIONGAP 10 12 9     Hematology Recent Labs  Lab 10/08/22 0112 10/09/22 0043 10/09/22 0703  WBC 7.2 7.1 6.5  RBC 4.13* 3.93* 3.97*  HGB 10.2* 9.9* 10.1*  HCT 31.5* 30.0* 30.3*  MCV 76.3* 76.3* 76.3*  MCH 24.7* 25.2* 25.4*  MCHC 32.4 33.0 33.3  RDW 17.9* 17.5* 17.9*    PLT 162 169 167    Cardiac EnzymesNo results for input(s): "TROPONINI" in the last 168 hours. No results for input(s): "TROPIPOC" in the last 168 hours.   BNP Recent Labs  Lab 10/07/22 1110  BNP 352.0*     DDimer No results for input(s): "DDIMER" in the last 168 hours.   Radiology    DG Chest 2 View  Result Date: 10/08/2022 CLINICAL DATA:  Pleural effusion EXAM: CHEST - 2 VIEW COMPARISON:  10/07/2022 FINDINGS: Left-sided implanted cardiac device remains in place. Heart size appears enlarged although is partially obscured. Large left and small right pleural effusions, slightly increased. Increasing bibasilar airspace opacities. No pneumothorax. IMPRESSION: Large left and small right pleural effusions, slightly increased. Increasing bibasilar airspace opacities. Electronically Signed   By: Nicholas  Plundo D.O.   On: 10/08/2022 08:31   ECHOCARDIOGRAM COMPLETE  Result Date: 10/07/2022    ECHOCARDIOGRAM REPORT   Patient Name:   [name] Date of Exam: 10/07/2022 Medical Rec #:  [id number]             Height:        71.0 in Accession #:    [id number]            Weight:       152.8 lb Date of Birth:  04/05/1924            BSA:          1.881 m Patient Age:    89 years              BP:           119/85 mmHg Patient Gender: M                     HR:           96 bpm. Exam Location:  Inpatient Procedure: 2D Echo, Cardiac Doppler, Color Doppler and Intracardiac            Opacification Agent Indications:    Pericardial effusion I31.3  History:        Patient has prior history of Echocardiogram examinations, most                 recent 07/07/2022. CHF, Stroke, Arrythmias:Atrial Fibrillation,                 Bradycardia and Tachycardia, Signs/Symptoms:Hypotension and                 Dyspnea; Risk Factors:Hypertension and Diabetes. CKD, stage 3.  Sonographer:    Shanika Turnbull Referring Phys: [id number] RONDELL A SMITH  Sonographer Comments: Image acquisition challenging due to patient behavioral factors. IMPRESSIONS  1. Left ventricular ejection fraction, by estimation, is 50 to 55%. The left ventricle has low normal function. The left ventricle has no regional wall motion abnormalities. There is severe left ventricular hypertrophy. Left ventricular diastolic parameters are indeterminate.  2. Right ventricular systolic function is normal. The right ventricular size is normal. There is mildly elevated pulmonary artery systolic pressure.  3. Left atrial size was severely dilated.  4. Large pericardial effusion. There is no evidence of cardiac tamponade.  5. The mitral valve was not well visualized. Trivial mitral valve regurgitation. No evidence of mitral stenosis.  6. The aortic valve is tricuspid. There is mild calcification of the aortic valve. There is mild thickening of the aortic valve. Aortic valve regurgitation is trivial. Aortic valve sclerosis is present, with no evidence of aortic valve stenosis.    7. The inferior vena cava is dilated in size with >50% respiratory variability, suggesting right atrial pressure of 8 mmHg.  Comparison(s): Changes from prior study are noted. Pericardial effusion is larger than prior, though no echo evidence of tamponade. FINDINGS  Left Ventricle: Left ventricular ejection fraction, by estimation, is 50 to 55%. The left ventricle has low normal function. The left ventricle has no regional wall motion abnormalities. Definity contrast agent was given IV to delineate the left ventricular endocardial borders. The left ventricular internal cavity size was normal in size. There is severe left ventricular hypertrophy. Left ventricular diastolic parameters are indeterminate. Right Ventricle: The right ventricular size is normal. Right vetricular wall thickness was not well visualized. Right ventricular systolic function is normal. There is mildly elevated pulmonary artery systolic pressure. The tricuspid regurgitant velocity  is 2.78 m/s, and with an assumed right atrial pressure of 8 mmHg, the estimated right ventricular systolic pressure is 38.9 mmHg. Left Atrium: Left atrial size was severely dilated. Right Atrium: Right atrial size was normal in size. Pericardium: Large pericardial effusion, maximum diameter 3.72 cm posterior to LV. Adherent material seen in pericardial fluid, but fluid generally low density. A large pericardial effusion is present. There is no evidence of cardiac tamponade. Mitral Valve: The mitral valve was not well visualized. Trivial mitral valve regurgitation. No evidence of mitral valve stenosis. Tricuspid Valve: The tricuspid valve is normal in structure. Tricuspid valve regurgitation is mild . No evidence of tricuspid stenosis. Aortic Valve: The aortic valve is tricuspid. There is mild calcification of the aortic valve. There is mild thickening of the aortic valve. Aortic valve regurgitation is trivial. Aortic valve sclerosis is present, with no evidence of aortic valve stenosis. Aortic valve peak gradient measures 2.6 mmHg. Pulmonic Valve: The pulmonic valve was not well visualized.  Pulmonic valve regurgitation is not visualized. No evidence of pulmonic stenosis. Aorta: The aortic root, ascending aorta, aortic arch and descending aorta are all structurally normal, with no evidence of dilitation or obstruction. Venous: The inferior vena cava is dilated in size with greater than 50% respiratory variability, suggesting right atrial pressure of 8 mmHg. IAS/Shunts: The atrial septum is grossly normal. Additional Comments: A device lead is visualized in the right ventricle and right atrium.  LEFT VENTRICLE PLAX 2D LVIDd:         3.60 cm   Diastology LVIDs:         2.70 cm   LV e' lateral:   10.70 cm/s LV PW:         1.30 cm   LV E/e' lateral: 4.9 LV IVS:        1.80 cm LVOT diam:     2.10 cm LV SV:         31 LV SV Index:   17 LVOT Area:     3.46 cm  RIGHT VENTRICLE             IVC RV S prime:     14.30 cm/s  IVC diam: 2.00 cm LEFT ATRIUM            Index        RIGHT ATRIUM           Index LA diam:      4.30 cm  2.29 cm/m   RA Area:     15.10 cm LA Vol (A2C): 89.7 ml  47.70 ml/m  RA Volume:   32.70 ml  17.39 ml/m LA Vol (A4C): 112.0 ml 59.56 ml/m  AORTIC   VALVE AV Area (Vmax): 2.60 cm AV Vmax:        80.10 cm/s AV Peak Grad:   2.6 mmHg LVOT Vmax:      60.05 cm/s LVOT Vmean:     38.650 cm/s LVOT VTI:       0.091 m  AORTA Ao Root diam: 4.00 cm Ao Asc diam:  3.60 cm MITRAL VALVE               TRICUSPID VALVE MV Area (PHT): 6.54 cm    TR Peak grad:   30.9 mmHg MV Decel Time: 116 msec    TR Vmax:        278.00 cm/s MV E velocity: 52.00 cm/s MV A velocity: 35.50 cm/s  SHUNTS MV E/A ratio:  1.46        Systemic VTI:  0.09 m                            Systemic Diam: 2.10 cm Bridgette Christopher MD Electronically signed by Bridgette Christopher MD Signature Date/Time: 10/07/2022/7:54:10 PM    Final    DG Chest Port 1 View  Result Date: 10/07/2022 CLINICAL DATA:  Exertional dyspnea EXAM: PORTABLE CHEST 1 VIEW COMPARISON:  X-ray 07/25/2022 FINDINGS: Increasing now moderate to large left effusion.  Increasing small right effusion. Adjacent opacity. Prominent central vasculature. Enlarged heart. Calcified aorta. No pneumothorax. Left upper chest pacemaker. Overlapping cardiac leads. Surgical clips in the upper abdomen. IMPRESSION: Increasing pleural effusions, moderate to large left and small right with the adjacent opacity. Vascular congestion. Electronically Signed   By: Ashok  Gupta M.D.   On: 10/07/2022 11:07    Cardiac Studies   TTE 10/07/2022 IMPRESSIONS     1. Left ventricular ejection fraction, by estimation, is 50 to 55%. The  left ventricle has low normal function. The left ventricle has no regional  wall motion abnormalities. There is severe left ventricular hypertrophy.  Left ventricular diastolic  parameters are indeterminate.   2. Right ventricular systolic function is normal. The right ventricular  size is normal. There is mildly elevated pulmonary artery systolic  pressure.   3. Left atrial size was severely dilated.   4. Large pericardial effusion. There is no evidence of cardiac tamponade.   5. The mitral valve was not well visualized. Trivial mitral valve  regurgitation. No evidence of mitral stenosis.   6. The aortic valve is tricuspid. There is mild calcification of the  aortic valve. There is mild thickening of the aortic valve. Aortic valve  regurgitation is trivial. Aortic valve sclerosis is present, with no  evidence of aortic valve stenosis.   7. The inferior vena cava is dilated in size with >50% respiratory  variability, suggesting right atrial pressure of 8 mmHg.   Comparison(s): Changes from prior study are noted. Pericardial effusion is  larger than prior, though no echo evidence of tamponade.   FINDINGS   Left Ventricle: Left ventricular ejection fraction, by estimation, is 50  to 55%. The left ventricle has low normal function. The left ventricle has  no regional wall motion abnormalities. Definity contrast agent was given  IV to delineate the  left  ventricular endocardial borders. The left ventricular internal cavity size  was normal in size. There is severe left ventricular hypertrophy. Left  ventricular diastolic parameters are indeterminate.   Right Ventricle: The right ventricular size is normal. Right vetricular  wall thickness was not well visualized. Right ventricular systolic  function is   normal. There is mildly elevated pulmonary artery systolic  pressure. The tricuspid regurgitant velocity   is 2.78 m/s, and with an assumed right atrial pressure of 8 mmHg, the  estimated right ventricular systolic pressure is 38.9 mmHg.   Left Atrium: Left atrial size was severely dilated.   Right Atrium: Right atrial size was normal in size.   Pericardium: Large pericardial effusion, maximum diameter 3.72 cm  posterior to LV. Adherent material seen in pericardial fluid, but fluid  generally low density. A large pericardial effusion is present. There is  no evidence of cardiac tamponade.   Mitral Valve: The mitral valve was not well visualized. Trivial mitral  valve regurgitation. No evidence of mitral valve stenosis.   Tricuspid Valve: The tricuspid valve is normal in structure. Tricuspid  valve regurgitation is mild . No evidence of tricuspid stenosis.   Aortic Valve: The aortic valve is tricuspid. There is mild calcification  of the aortic valve. There is mild thickening of the aortic valve. Aortic  valve regurgitation is trivial. Aortic valve sclerosis is present, with no  evidence of aortic valve  stenosis. Aortic valve peak gradient measures 2.6 mmHg.   Pulmonic Valve: The pulmonic valve was not well visualized. Pulmonic valve  regurgitation is not visualized. No evidence of pulmonic stenosis.   Aorta: The aortic root, ascending aorta, aortic arch and descending aorta  are all structurally normal, with no evidence of dilitation or  obstruction.   Venous: The inferior vena cava is dilated in size with greater than  50%  respiratory variability, suggesting right atrial pressure of 8 mmHg.   IAS/Shunts: The atrial septum is grossly normal.   Patient Profile     89 y.o. male  CVA, atrial fibrillation, Type II diabetes mellitus, peripheral artery disease endovascular intervention of the occluded left popliteal artery and TP trunk using a retrograde access via the left posterior tibial artery, symptomatic bradycardia status post pacemaker implantation, chronic kidney disease   Assessment & Plan      Large pericardial effusion Shortness of breath  Elevated trop - flat, no need to trent this Bilateral pleural effusion  Permanent atrial fibrillation on chronic anticoagulation  Tachy-brady syndrome status post pacemaker implantation  PAD Chronic Kidney disease stage IV Diabetes Mellitus  Dementia    Social breath, large pericardial effusion on echo which is improved than prior.  No tamponade features seen.  He has been given Lasix over the last couple of days.  He will benefit from pericardiocentesis given symptoms  The patient has  DPR (Keturah Jones [phone]), who understands that risks include but are not limited to stroke (1 in 1000), death (1 in 1000), kidney failure [usually temporary] (1 in 500), bleeding (1 in 200), allergic reaction [possibly serious] (1 in 200), and agrees to proceed with the pericardiocentesis.    Unfortunately with his kidney disease we cannot use colchicine, if he does not respond to the Lasix we also have will consider pericardiocentesis, I will discuss with our interventional team.    Nephrology evaluation as well given chronic kidney stage IV and diuretics use.  Diabetes mellitus managed by the primary team.  Troponin flat no angina no need to pursue any ischemic evaluation.    For questions or updates, please contact [facility] Please consult [email or web address] for contact info under Cardiology/STEMI.      Signed, Sala Tague, DO  10/09/2022, 8:43 AM    

## 2022-10-09 NOTE — Assessment & Plan Note (Signed)
Continue blood pressure control.  Currently not on anticoagulation for atrial fibrillation.

## 2022-10-09 NOTE — Assessment & Plan Note (Addendum)
No agitation, continue with memantine and depakote.

## 2022-10-09 NOTE — Progress Notes (Signed)
ANTICOAGULATION CONSULT NOTE  Pharmacy Consult for heparin Indication: atrial fibrillation Brief A/P: aPTT supratherapeutic Decrease Heparin rate  No Known Allergies  Patient Measurements: Height: 5\' 11"  (180.3 cm) Weight: 67.5 kg (148 lb 13 oz) IBW/kg (Calculated) : 75.3 Heparin Dosing Weight: 67.5 kg  Vital Signs: Temp: 97.8 F (36.6 C) (05/22 0050) Temp Source: Oral (05/22 0050) BP: 118/86 (05/22 0050) Pulse Rate: 109 (05/22 0050)  Labs: Recent Labs    10/07/22 1110 10/07/22 1110 10/07/22 1249 10/08/22 0112 10/08/22 0745 10/08/22 1830 10/09/22 0043  HGB 9.6*  11.2*  --   --  10.2*  --   --  9.9*  HCT 29.8*  33.0*  --   --  31.5*  --   --  30.0*  PLT 162  --   --  162  --   --  169  APTT  --    < >  --  53* 124* 47* 109*  HEPARINUNFRC  --   --   --  >1.10*  --   --  >1.10*  CREATININE 2.30*  2.30*  --   --  2.25*  --   --  2.30*  TROPONINIHS 26*  --  27*  --   --   --   --    < > = values in this interval not displayed.     Estimated Creatinine Clearance: 17.9 mL/min (A) (by C-G formula based on SCr of 2.3 mg/dL (H)).  Assessment: 89 y.o. male with h/o Afib, Eliquis on hold, for heparin  Goal of Therapy:  Heparin level 0.3-0.7 units/ml aPTT 66-102 seconds Monitor platelets by anticoagulation protocol: Yes   Plan:  Decrease Heparin 800 units/hr  Geannie Risen, PharmD, BCPS  10/09/2022 2:22 AM

## 2022-10-09 NOTE — Assessment & Plan Note (Addendum)
Continue glucose cover and monitoring with insulin sliding scale.  Fasting glucose this am is 194 mg/dl.

## 2022-10-09 NOTE — Assessment & Plan Note (Addendum)
Persistent atrial fibrillation.  Patient has diagnosis of tachy brady syndrome and had a pacemaker in place.   Telemetry personally reviewed, persistent atrial fibrillation rate 80 to 90. Resume apixaban for anticoagulation,  Continue with low dose metoprolol for rate control.

## 2022-10-09 NOTE — Assessment & Plan Note (Addendum)
Large pericardial effusion.  05/22 Sp pericardiocentesis 1030 cc fluid removed, serosanguinous and somewhat dark.  Echocardiogram with RV collapse on echocardiogram.   Today cathter has been removed.  Continue telemetry monitoring, because hypotension will keep patient in progressive care unit today.

## 2022-10-09 NOTE — Assessment & Plan Note (Addendum)
Renal function with serum cr stable at 2,35 with K at 4,4 and serum bicarbonate at 24. Na 136.   Continue close monitoring of renal function and electrolytes.  Avoid hypotension and nephrotoxic medications.   Anemia of chronic renal disease.  Stable hgb.

## 2022-10-09 NOTE — Interval H&P Note (Signed)
History and Physical Interval Note:  10/09/2022 10:06 AM  [name]  has presented today for surgery, with the diagnosis of chest pain.  The various methods of treatment have been discussed with the patient and family. After consideration of risks, benefits and other options for treatment, the patient has consented to  Procedure(s): PERICARDIOCENTESIS (N/A) as a surgical intervention.  The patient's history has been reviewed, patient examined, no change in status, stable for surgery.  I have reviewed the patient's chart and labs.  Questions were answered to the patient's satisfaction.     Lance Muss

## 2022-10-09 NOTE — Progress Notes (Signed)
Progress Note   Patient: [name] ZOX:[id number] DOB: 02-Jan-1924 DOA: 10/07/2022     2 DOS: the patient was seen and examined on 10/09/2022   Brief hospital course: Ronney Loga is a 89 y.o. male with medical history significant of hypertension, heart failure with preserved EF, atrial fibrillation, tachybradycardia s/p pacemaker, CVA, peripheral arterial disease, CKD stage IV, diabetes mellitus type 2 who presents with complaints of shortness of breath.  At baseline patient ambulates with use of a rollator or wheelchair and family reports that he has 24-hour care.  Over the last 3 days patient had reported having progressively worsening shortness of breath mostly with exertion, but sometimes at rest.  Patient denies any significant cough, fever, nausea, vomiting, or diarrhea symptoms.  He chronically has sputum production and grandson-in-law notes that sometimes the patient seems to get choked up with eating.  Patient had been in it back in March of this year due to pleural effusions requiring thoracentesis for which fluid was noted to be transudative with no malignant cells seen on cytology.  Family makes note that the patient also has a wound of his left great toe that is been present for several weeks now.  He has been followed by Dr. Lilian Kapur with podiatry in the outpatient setting.  Patient underwent ABIs with TBI's which noted resting left ankle-brachial index indicating moderate left lower extremity arterial disease with the left toe brachial index noted to be abnormal.  He was sent to Dr. Samuella Cota for peripheral artery disease with critical limb ischemia and gangrenous first toe.  Revascularization was recommended, but family was worried in regards to patient's age and comorbidities.   In the emergency department patient was noted to be afebrile with respirations 12-23, and all other vital signs maintained.  Labs significant for WBC 7, hemoglobin 9.6, BUN 28, creatinine 2.3,  ionized calcium 1.11, and BNP 352.  Chest x-ray noting increasing pleural effusions moderate to large on the left and small right effusion with adjacent opacity.  Patient has been given Lasix 40 mg IV x 1 dose.  Cardiology had been formally consulted.  Patient was placed on furosemide for diuresis.  Echocardiogram with large pericardial effusion with signs of tamponade. 05/22 pericardiocentesis 1 L removed and catheter left in place.    Assessment and Plan: Acute on chronic diastolic CHF (congestive heart failure) ([name]) Echocardiogram with preserved LV systolic function with EF 50 to 55%, severe LVH, RVSP 38.9. LA with severe dilatation. Large pericardial effusion.   Documented urine output is 850 ml Systolic blood pressure 129 to 135 mmHg.  Plan to continue furosemide 40 mg IV daily Midodrine for blood pressure support.  Limited pharmacologic therapy due to reduced GFR.   Pericardial effusion Large pericardial effusion.  Sp pericardiocentesis today, 1030 cc fluid removed, serosanguinous and somewhat dark.  Echocardiogram with RV collapse on echocardiogram.     Hypertension Continue midodrine for blood pressure support.   Atrial fibrillation ([name]) Persistent atrial fibrillation.  Patient has diagnosis of tachy brady syndrome and had a pacemaker in place.   Heart rate in the low 100 today   Plan to resume low dose metoprolol 25 mg po bid to prevent RVR.   PVD (peripheral vascular disease) ([name]) Continue blood pressure support.  CKD (chronic kidney disease), stage IV ([name]) Renal function with serum cr at 2,30 with K at 4,4 and serum bicarbonate at 24. Na 136,  Plan to continue diuresis with furosemide and follow up renal function in am.  Check Mg.  Anemia of chronic renal disease.  Stable hgb.   Type 2 diabetes mellitus without complication, without long-term current use of insulin ([name]) Continue glucose cover and monitoring with insulin sliding scale.  Fasting  glucose this am is 129 mg/dl.   Alzheimer disease ([name]) No agitation, continue with memantine and depakote.   History of CVA (cerebrovascular accident) Continue blood pressure control.  Currently not on anticoagulation for atrial fibrillation.   Benign prostatic hyperplasia with urinary obstruction Continue to monitor urine output.  On proscar and flomax.   Gangrene of toe of left foot ([name]) Indicated intervention, but his family has not decided yet.         Subjective: Patient with mild somnolence post procedure but easy to arouse and responds to questions. No chest pain or dyspnea   Physical Exam: Vitals:   10/09/22 1115 10/09/22 1130 10/09/22 1145 10/09/22 1200  BP:    (!) 127/91  Pulse: (!) 111 (!) 187 (!) 102 (!) 110  Resp: 17 14 19 14   Temp:      TempSrc:      SpO2: 93% (!) 87% 99% 99%  Weight:      Height:       Neurology awake and alert ENT with mil pallor Cardiovascular with S1 and S2 present irregularly irregular with no gallops or murmurs No JVD Trace lower extremity edema  Respiratory with no rales or wheezing, no rhonchi Abdomen with no distention  Pericardiocentesis catheter in place.,  Data Reviewed:    Family Communication: no family at the bedside   Disposition: Status is: Inpatient Remains inpatient appropriate because: post pericardiocentesis   Planned Discharge Destination: Home     Author: Coralie Keens, MD 10/09/2022 2:48 PM  For on call review [email or web address].

## 2022-10-09 NOTE — Progress Notes (Signed)
[name] Progress Note   89 y.o. male HTN, HFpEF, afib, pacer for tachybrady syndrome, CVA, PAD, DM, CKDIV p/w sob. Rollator or wheelchair bound and has 3d h/o progressively worsening SOB. Patient required a thoracentesis in March which was transudative but no e/o malignancy. Patient with abnormal ankle-brachial index which was done because of a left great toe wound. Found to have a large pericardial effusion tapped on 5/22.    Assessment/ Plan:   CKDIV w/ BL cr 1.9-2.4 range since mid 2024 not followed by nephrology given Lasix 40mg  on 5/20 with 1450 mL UOP /24hrs. Currently with a foley but no h/o BOO. - He is not a long term dialysis candidate given his age, functional status, and comorbidities. - However, he is dyspneic and if he develops renal failure requiring dialysis after the pericardiocentesis then will offer on a time limited trial. Ie 1-2 weeks to allow acute component of CIN a chance to recover. Of course with his advanced renal disease and limited reserve there is a chance he could become ESRD. But again, would not recommend long term dialysis given it would likely worsen his QOL.   - Will give another dose of lasix 40mg ; pt's breathing is much better after the pericardiocentesis on 5/22. Renal function appears to be stable; eventually will need study of the runoff to the left foot but if possible a staged approach would be better to minimize the contrast given in a short period of time.     -Maintain MAP>65 for optimal renal perfusion.  - Avoid nephrotoxic medications including NSAIDs and iodinated intravenous contrast exposure unless the latter is absolutely indicated.   - Preferred narcotic agents for pain control are hydromorphone, fentanyl, and methadone. Morphine should not be used.  - Avoid Baclofen and avoid oral sodium phosphate and magnesium citrate based laxatives / bowel preps.  - Continue strict Input and Output monitoring. Will monitor the patient  closely with you and intervene or adjust therapy as indicated by changes in clinical status/labs   Pericardial effusion - Large pericardial effusion, maximum diameter 3.72 cm  posterior to LV. Adherent material seen in pericardial fluid, but fluid  generally low density. Pericardial effusion is larger compared with prior echo but present  but no evidence of cardiac tamponade -> pericardiocentesis on 5/22.  DM Atrial fibrillation on chronic anticoagulation Tachy-brady syndrome w/ PM Dementia  Subjective:   Breathing much improved; denies f/c/n/v.   Objective:   BP (!) 115/92   Pulse (!) 110   Temp 97.6 F (36.4 C) (Oral)   Resp (!) 25   Ht 5\' 11"  (1.803 m)   Wt 66.1 kg   SpO2 99%   BMI 20.32 kg/m   Intake/Output Summary (Last 24 hours) at 10/09/2022 1138 Last data filed at 10/09/2022 0700 Gross per 24 hour  Intake 157.82 ml  Output 850 ml  Net -692.18 ml   Weight change: -3.2 kg  Physical Exam: General appearance: alert, cooperative, and appears stated age Head: NCAT Back: No CVA tenderness. Resp: CTA b/l; able to auscultate left side better today Cardio: regular rate and rhythm, pigtail in place GI: SNT, reducible hernia anterior wall Extremities: edema tr-1+ and lt GT plantar surface dry gangrene Pulses: Unable to palpate lt DP Skin: No rashes or lesions  Imaging: CARDIAC CATHETERIZATION  Result Date: 10/09/2022   Successful pericardiocentesis with 1030 cc fluid removal.  Fluid was serosanguinous and somewhat dark. Will leave drain in place overnight.  Would hopefully be able to remove tomorrow  based on drainage.  Results conveyed to granddaughter.   DG Chest 2 View  Result Date: 10/08/2022 CLINICAL DATA:  Pleural effusion EXAM: CHEST - 2 VIEW COMPARISON:  10/07/2022 FINDINGS: Left-sided implanted cardiac device remains in place. Heart size appears enlarged although is partially obscured. Large left and small right pleural effusions, slightly increased. Increasing  bibasilar airspace opacities. No pneumothorax. IMPRESSION: Large left and small right pleural effusions, slightly increased. Increasing bibasilar airspace opacities. Electronically Signed   By: Duanne Guess D.O.   On: 10/08/2022 08:31   ECHOCARDIOGRAM COMPLETE  Result Date: 10/07/2022    ECHOCARDIOGRAM REPORT   Patient Name:   [name] Date of Exam: 10/07/2022 Medical Rec #:  [id number]             Height:       71.0 in Accession #:    [id number]            Weight:       152.8 lb Date of Birth:  12-07-1923            BSA:          1.881 m Patient Age:    89 years              BP:           119/85 mmHg Patient Gender: M                     HR:           96 bpm. Exam Location:  Inpatient Procedure: 2D Echo, Cardiac Doppler, Color Doppler and Intracardiac            Opacification Agent Indications:    Pericardial effusion I31.3  History:        Patient has prior history of Echocardiogram examinations, most                 recent 07/07/2022. CHF, Stroke, Arrythmias:Atrial Fibrillation,                 Bradycardia and Tachycardia, Signs/Symptoms:Hypotension and                 Dyspnea; Risk Factors:Hypertension and Diabetes. CKD, stage 3.  Sonographer:    Lucendia Herrlich Referring Phys: [id number] RONDELL A SMITH  Sonographer Comments: Image acquisition challenging due to patient behavioral factors. IMPRESSIONS  1. Left ventricular ejection fraction, by estimation, is 50 to 55%. The left ventricle has low normal function. The left ventricle has no regional wall motion abnormalities. There is severe left ventricular hypertrophy. Left ventricular diastolic parameters are indeterminate.  2. Right ventricular systolic function is normal. The right ventricular size is normal. There is mildly elevated pulmonary artery systolic pressure.  3. Left atrial size was severely dilated.  4. Large pericardial effusion. There is no evidence of cardiac tamponade.  5. The mitral valve was not well visualized. Trivial  mitral valve regurgitation. No evidence of mitral stenosis.  6. The aortic valve is tricuspid. There is mild calcification of the aortic valve. There is mild thickening of the aortic valve. Aortic valve regurgitation is trivial. Aortic valve sclerosis is present, with no evidence of aortic valve stenosis.  7. The inferior vena cava is dilated in size with >50% respiratory variability, suggesting right atrial pressure of 8 mmHg. Comparison(s): Changes from prior study are noted. Pericardial effusion is larger than prior, though no echo evidence of tamponade. FINDINGS  Left Ventricle: Left ventricular ejection fraction, by estimation, is  50 to 55%. The left ventricle has low normal function. The left ventricle has no regional wall motion abnormalities. Definity contrast agent was given IV to delineate the left ventricular endocardial borders. The left ventricular internal cavity size was normal in size. There is severe left ventricular hypertrophy. Left ventricular diastolic parameters are indeterminate. Right Ventricle: The right ventricular size is normal. Right vetricular wall thickness was not well visualized. Right ventricular systolic function is normal. There is mildly elevated pulmonary artery systolic pressure. The tricuspid regurgitant velocity  is 2.78 m/s, and with an assumed right atrial pressure of 8 mmHg, the estimated right ventricular systolic pressure is 38.9 mmHg. Left Atrium: Left atrial size was severely dilated. Right Atrium: Right atrial size was normal in size. Pericardium: Large pericardial effusion, maximum diameter 3.72 cm posterior to LV. Adherent material seen in pericardial fluid, but fluid generally low density. A large pericardial effusion is present. There is no evidence of cardiac tamponade. Mitral Valve: The mitral valve was not well visualized. Trivial mitral valve regurgitation. No evidence of mitral valve stenosis. Tricuspid Valve: The tricuspid valve is normal in structure.  Tricuspid valve regurgitation is mild . No evidence of tricuspid stenosis. Aortic Valve: The aortic valve is tricuspid. There is mild calcification of the aortic valve. There is mild thickening of the aortic valve. Aortic valve regurgitation is trivial. Aortic valve sclerosis is present, with no evidence of aortic valve stenosis. Aortic valve peak gradient measures 2.6 mmHg. Pulmonic Valve: The pulmonic valve was not well visualized. Pulmonic valve regurgitation is not visualized. No evidence of pulmonic stenosis. Aorta: The aortic root, ascending aorta, aortic arch and descending aorta are all structurally normal, with no evidence of dilitation or obstruction. Venous: The inferior vena cava is dilated in size with greater than 50% respiratory variability, suggesting right atrial pressure of 8 mmHg. IAS/Shunts: The atrial septum is grossly normal. Additional Comments: A device lead is visualized in the right ventricle and right atrium.  LEFT VENTRICLE PLAX 2D LVIDd:         3.60 cm   Diastology LVIDs:         2.70 cm   LV e' lateral:   10.70 cm/s LV PW:         1.30 cm   LV E/e' lateral: 4.9 LV IVS:        1.80 cm LVOT diam:     2.10 cm LV SV:         31 LV SV Index:   17 LVOT Area:     3.46 cm  RIGHT VENTRICLE             IVC RV S prime:     14.30 cm/s  IVC diam: 2.00 cm LEFT ATRIUM            Index        RIGHT ATRIUM           Index LA diam:      4.30 cm  2.29 cm/m   RA Area:     15.10 cm LA Vol (A2C): 89.7 ml  47.70 ml/m  RA Volume:   32.70 ml  17.39 ml/m LA Vol (A4C): 112.0 ml 59.56 ml/m  AORTIC VALVE AV Area (Vmax): 2.60 cm AV Vmax:        80.10 cm/s AV Peak Grad:   2.6 mmHg LVOT Vmax:      60.05 cm/s LVOT Vmean:     38.650 cm/s LVOT VTI:       0.091 m  AORTA Ao Root diam: 4.00 cm Ao Asc diam:  3.60 cm MITRAL VALVE               TRICUSPID VALVE MV Area (PHT): 6.54 cm    TR Peak grad:   30.9 mmHg MV Decel Time: 116 msec    TR Vmax:        278.00 cm/s MV E velocity: 52.00 cm/s MV A velocity: 35.50 cm/s   SHUNTS MV E/A ratio:  1.46        Systemic VTI:  0.09 m                            Systemic Diam: 2.10 cm Jodelle Red MD Electronically signed by Jodelle Red MD Signature Date/Time: 10/07/2022/7:54:10 PM    Final     Labs: BMET Recent Labs  Lab 10/07/22 1110 10/08/22 0112 10/09/22 0043  NA 139  142 138 136  K 4.4  4.4 4.5 4.4  CL 104  103 103 103  CO2 25 23 24   GLUCOSE 177*  175* 136* 129*  BUN 27*  28* 31* 30*  CREATININE 2.30*  2.30* 2.25* 2.30*  CALCIUM 8.0* 8.3* 8.0*   CBC Recent Labs  Lab 10/07/22 1110 10/08/22 0112 10/09/22 0043 10/09/22 0703  WBC 7.0 7.2 7.1 6.5  NEUTROABS 5.3  --   --   --   HGB 9.6*  11.2* 10.2* 9.9* 10.1*  HCT 29.8*  33.0* 31.5* 30.0* 30.3*  MCV 79.0* 76.3* 76.3* 76.3*  PLT 162 162 169 167    Medications:     divalproex  125 mg Oral BID   docusate sodium  100 mg Oral Daily   finasteride  5 mg Oral Daily   furosemide  40 mg Intravenous Daily   insulin aspart  0-5 Units Subcutaneous QHS   insulin aspart  0-6 Units Subcutaneous TID WC   insulin aspart  0-6 Units Subcutaneous TID WC   latanoprost  1 drop Both Eyes QHS   melatonin  3 mg Oral QHS   memantine  10 mg Oral BID   midodrine  2.5 mg Oral TID WC   pantoprazole  40 mg Oral Daily   sodium chloride flush  3 mL Intravenous Q12H   tamsulosin  0.4 mg Oral Daily      Paulene Floor, MD 10/09/2022, 11:38 AM

## 2022-10-09 NOTE — Assessment & Plan Note (Signed)
Continue to monitor urine output.  On proscar and flomax.

## 2022-10-10 ENCOUNTER — Inpatient Hospital Stay (HOSPITAL_COMMUNITY): Payer: No Typology Code available for payment source

## 2022-10-10 DIAGNOSIS — I5033 Acute on chronic diastolic (congestive) heart failure: Secondary | ICD-10-CM | POA: Diagnosis not present

## 2022-10-10 DIAGNOSIS — N184 Chronic kidney disease, stage 4 (severe): Secondary | ICD-10-CM | POA: Diagnosis not present

## 2022-10-10 DIAGNOSIS — I3139 Other pericardial effusion (noninflammatory): Secondary | ICD-10-CM

## 2022-10-10 DIAGNOSIS — I4891 Unspecified atrial fibrillation: Secondary | ICD-10-CM | POA: Diagnosis not present

## 2022-10-10 LAB — BASIC METABOLIC PANEL
Anion gap: 10 (ref 5–15)
BUN: 30 mg/dL — ABNORMAL HIGH (ref 8–23)
CO2: 24 mmol/L (ref 22–32)
Calcium: 8.1 mg/dL — ABNORMAL LOW (ref 8.9–10.3)
Chloride: 102 mmol/L (ref 98–111)
Creatinine, Ser: 2.26 mg/dL — ABNORMAL HIGH (ref 0.61–1.24)
GFR, Estimated: 26 mL/min — ABNORMAL LOW (ref >60)
Glucose, Bld: 161 mg/dL — ABNORMAL HIGH (ref 70–99)
Potassium: 4.7 mmol/L (ref 3.5–5.1)
Sodium: 136 mmol/L (ref 135–145)

## 2022-10-10 LAB — MAGNESIUM: Magnesium: 1.9 mg/dL (ref 1.7–2.4)

## 2022-10-10 LAB — CBC
HCT: 35.3 % — ABNORMAL LOW (ref 39.0–52.0)
Hemoglobin: 11.6 g/dL — ABNORMAL LOW (ref 13.0–17.0)
MCH: 25.1 pg — ABNORMAL LOW (ref 26.0–34.0)
MCHC: 32.9 g/dL (ref 30.0–36.0)
MCV: 76.2 fL — ABNORMAL LOW (ref 80.0–100.0)
Platelets: 170 10*3/uL (ref 150–400)
RBC: 4.63 MIL/uL (ref 4.22–5.81)
RDW: 17.7 % — ABNORMAL HIGH (ref 11.5–15.5)
WBC: 8.1 10*3/uL (ref 4.0–10.5)
nRBC: 0 % (ref 0.0–0.2)

## 2022-10-10 LAB — LD, BODY FLUID (OTHER): LD, Body Fluid: 388 IU/L

## 2022-10-10 LAB — APTT: aPTT: 34 seconds (ref 24–36)

## 2022-10-10 LAB — GLUCOSE, CAPILLARY
Glucose-Capillary: 120 mg/dL — ABNORMAL HIGH (ref 70–99)
Glucose-Capillary: 144 mg/dL — ABNORMAL HIGH (ref 70–99)
Glucose-Capillary: 147 mg/dL — ABNORMAL HIGH (ref 70–99)
Glucose-Capillary: 122 mg/dL — ABNORMAL HIGH (ref 70–99)

## 2022-10-10 LAB — GLUCOSE, BODY FLUID OTHER: Glucose, Body Fluid Other: 100 mg/dL

## 2022-10-10 LAB — CULTURE, BODY FLUID W GRAM STAIN -BOTTLE: Culture: NO GROWTH

## 2022-10-10 LAB — PROTEIN, BODY FLUID (OTHER): Total Protein, Body Fluid Other: 4 g/dL

## 2022-10-10 LAB — CYTOLOGY - NON PAP

## 2022-10-10 LAB — ECHOCARDIOGRAM LIMITED
Height: 71 in
Weight: 2232.82 oz

## 2022-10-10 MED ORDER — ENSURE ENLIVE PO LIQD
237.0000 mL | Freq: Every day | ORAL | Status: DC
  Administered 2022-10-10 – 2022-10-15 (×6): 237 mL via ORAL

## 2022-10-10 MED ORDER — MAGNESIUM SULFATE 2 GM/50ML IV SOLN
2.0000 g | Freq: Once | INTRAVENOUS | Status: AC
  Administered 2022-10-10: 2 g via INTRAVENOUS
  Filled 2022-10-10: qty 50

## 2022-10-10 NOTE — Progress Notes (Signed)
[facility] Progress Note   89 y.o. male HTN, HFpEF, afib, pacer for tachybrady syndrome, CVA, PAD, DM, CKDIV p/w sob. Rollator or wheelchair bound and has 3d h/o progressively worsening SOB. Patient required a thoracentesis in March which was transudative but no e/o malignancy. Patient with abnormal ankle-brachial index which was done because of a left great toe wound. Found to have a large pericardial effusion tapped on 5/22.    Assessment/ Plan:   CKDIV w/ BL cr 1.9-2.4 range since mid 2024 not followed by nephrology given Lasix 40mg  on 5/20 with 1450 mL UOP /24hrs. Currently with a foley but no h/o BOO. - He is not a long term dialysis candidate given his age, functional status, and comorbidities. - However, he is dyspneic and if he develops renal failure requiring dialysis after the pericardiocentesis then will offer on a time limited trial. Ie 1-2 weeks to allow acute component of CIN a chance to recover. Of course with his advanced renal disease and limited reserve there is a chance he could become ESRD. But again, would not recommend long term dialysis given it would likely worsen his QOL.   - Pt's breathing remains much better after the pericardiocentesis on 5/22 (1L). Renal function only slightly worsening. She still has e/o volume overload and would continue the daily Lasix for another day.  Eventually will need study of the runoff to the left foot and will need to make sure volume status is optimized and mainly that she is not prerenal to lower risk of CIN.   -Maintain MAP>65 for optimal renal perfusion.  - Avoid nephrotoxic medications including NSAIDs and iodinated intravenous contrast exposure unless the latter is absolutely indicated.   - Preferred narcotic agents for pain control are hydromorphone, fentanyl, and methadone. Morphine should not be used.  - Avoid Baclofen and avoid oral sodium phosphate and magnesium citrate based laxatives / bowel preps.  - Continue  strict Input and Output monitoring. Will monitor the patient closely with you and intervene or adjust therapy as indicated by changes in clinical status/labs   Pericardial effusion - Large pericardial effusion, maximum diameter 3.72 cm  posterior to LV. Adherent material seen in pericardial fluid, but fluid  generally low density. Pericardial effusion is larger compared with prior echo but present  but no evidence of cardiac tamponade -> pericardiocentesis on 5/22 (1L).  DM Atrial fibrillation on chronic anticoagulation Tachy-brady syndrome w/ PM Dementia  Subjective:   Breathing remains much improved; denies f/c/n/v.   Objective:   BP (!) 83/66 (BP Location: Left Arm)   Pulse 100   Temp 98.5 F (36.9 C) (Oral)   Resp 18   Ht 5\' 11"  (1.803 m)   Wt 63.3 kg   SpO2 100%   BMI 19.46 kg/m   Intake/Output Summary (Last 24 hours) at 10/10/2022 0908 Last data filed at 10/10/2022 0600 Gross per 24 hour  Intake 52.02 ml  Output 1165 ml  Net -1112.98 ml   Weight change: -2.8 kg  Physical Exam: General appearance: alert, cooperative, and appears stated age Head: NCAT Back: No CVA tenderness. Resp: CTA b/l; able to auscultate left side better today Cardio: regular rate and rhythm, pigtail in place GI: SNT, reducible hernia anterior wall Extremities: edema tr-1+ and lt GT plantar surface dry gangrene Pulses: Unable to palpate lt DP Skin: No rashes or lesions  Imaging: ECHOCARDIOGRAM LIMITED  Result Date: 10/09/2022    ECHOCARDIOGRAM LIMITED REPORT   Patient Name:   [name] [facility] Date of  Exam: 10/09/2022 Medical Rec #:  [id number]             Height:       71.0 in Accession #:    [id number]            Weight:       145.7 lb Date of Birth:  December 28, 1923            BSA:          1.843 m Patient Age:    89 years              BP:           111/81 mmHg Patient Gender: M                     HR:           80 bpm. Exam Location:  Inpatient Procedure: Limited Echo and Echo  Guidance/Pericardial Tap Indications:    I31.3 Pericardial effusion  History:        Patient has prior history of Echocardiogram examinations, most                 recent 10/07/2022. CHF, Arrythmias:Atrial Fibrillation; Risk                 Factors:Hypertension and Diabetes.  Sonographer:    Irving Burton Senior RDCS Referring Phys: [id number] Corky Crafts  Sonographer Comments: Pericardiocentesis IMPRESSIONS  1. 2.5 cm pericardial effusion with RV collapse. Catheter noted in pericardial space, apical. Post tap, trivial pericardial effusion with normal RV function. Pleural effusion noted. Large pericardial effusion. The pericardial effusion is circumferential. FINDINGS  Pericardium: 2.5 cm pericardial effusion with RV collapse. Catheter noted in pericardial space, apical. Post tap, trivial pericardial effusion with normal RV function. Pleural effusion noted. A large pericardial effusion is present. The pericardial effusion is circumferential. Donato Schultz MD Electronically signed by Donato Schultz MD Signature Date/Time: 10/09/2022/11:54:56 AM    Final    CARDIAC CATHETERIZATION  Result Date: 10/09/2022   Successful pericardiocentesis with 1030 cc fluid removal.  Fluid was serosanguinous and somewhat dark. Will leave drain in place overnight.  Would hopefully be able to remove tomorrow based on drainage.  Results conveyed to granddaughter.    Labs: BMET Recent Labs  Lab 10/07/22 1110 10/08/22 0112 10/09/22 0043 10/10/22 0051  NA 139  142 138 136 136  K 4.4  4.4 4.5 4.4 4.7  CL 104  103 103 103 102  CO2 25 23 24 24   GLUCOSE 177*  175* 136* 129* 161*  BUN 27*  28* 31* 30* 30*  CREATININE 2.30*  2.30* 2.25* 2.30* 2.26*  CALCIUM 8.0* 8.3* 8.0* 8.1*   CBC Recent Labs  Lab 10/07/22 1110 10/08/22 0112 10/09/22 0043 10/09/22 0703 10/10/22 0051  WBC 7.0 7.2 7.1 6.5 8.1  NEUTROABS 5.3  --   --   --   --   HGB 9.6*  11.2* 10.2* 9.9* 10.1* 11.6*  HCT 29.8*  33.0* 31.5* 30.0* 30.3* 35.3*  MCV 79.0*  76.3* 76.3* 76.3* 76.2*  PLT 162 162 169 167 170    Medications:     Chlorhexidine Gluconate Cloth  6 each Topical Daily   divalproex  125 mg Oral BID   docusate sodium  100 mg Oral Daily   finasteride  5 mg Oral Daily   furosemide  40 mg Intravenous Daily   heparin  5,000 Units Subcutaneous Q8H   insulin aspart  0-5 Units Subcutaneous QHS  insulin aspart  0-6 Units Subcutaneous TID WC   latanoprost  1 drop Both Eyes QHS   melatonin  3 mg Oral QHS   memantine  10 mg Oral BID   metoprolol tartrate  25 mg Oral BID   midodrine  2.5 mg Oral TID WC   pantoprazole  40 mg Oral Daily   sodium chloride flush  3 mL Intravenous Q12H   sodium chloride flush  3 mL Intravenous Q12H   tamsulosin  0.4 mg Oral Daily      Paulene Floor, MD 10/10/2022, 9:08 AM

## 2022-10-10 NOTE — Progress Notes (Signed)
Progress Note   Patient: [name] NWG:[id number] DOB: August 28, 1923 DOA: 10/07/2022     3 DOS: the patient was seen and examined on 10/10/2022   Brief hospital course: Mr. Biderman was hospitalized with the working diagnosis of heart failure decompensation.   89 y.o. male with medical history significant of hypertension, heart failure with preserved EF, atrial fibrillation, tachybradycardia s/p pacemaker, CVA, peripheral arterial disease, CKD stage IV, diabetes mellitus type 2 who presents with complaints of shortness of breath.  At baseline patient ambulates with use of a rollator or wheelchair and family reports that he has 24-hour care.  Over the last 3 days patient had reported having progressively worsening shortness of breath mostly with exertion, but sometimes at rest.    In the emergency department patient was noted to be afebrile with respirations 12-23, and all other vital signs maintained. Lungs with no wheezing or rhonchi, heart with S1 and S2 present irregularly irregular with no gallops or murmur, abdomen with no distention and positive lower extremity edema. Left foot great toes with necrotic tissue.   Labs significant for WBC 7, hemoglobin 9.6, BUN 28,  creatinine 2.3, ionized calcium 1.11, and BNP 352.    Chest radiograph with cardiomegaly, bilateral hilar vascular congestion, bilateral pleural effusions more left than right. Pacemaker in place with one lead in the right atrium and one in the right ventricle.   Patient has been given IV furosemide. Cardiology had been formally consulted.  Echocardiogram with large pericardial effusion with signs of tamponade. 05/22 pericardiocentesis 1 L removed and catheter left in place.  05/23 continue pericardiocentesis catheter in place.   Assessment and Plan: Acute on chronic diastolic CHF (congestive heart failure) ([name]) Echocardiogram with preserved LV systolic function with EF 50 to 55%, severe LVH, RVSP 38.9. LA with  severe dilatation. Large pericardial effusion.   Documented urine output is 1000  ml Systolic blood pressure 94  to 100 mmHg.  Plan to continue furosemide 40 mg IV daily Midodrine for blood pressure support.  Limited pharmacologic therapy due to reduced GFR.   Pericardial effusion Large pericardial effusion.  05/22 Sp pericardiocentesis 1030 cc fluid removed, serosanguinous and somewhat dark.  Echocardiogram with RV collapse on echocardiogram.   Catheter still in place, documented drainage 165 ml.     CKD (chronic kidney disease), stage IV ([name]) Volume status has improved, renal function today with serum cr at 2,26 with K at 4,7 and serum bicarbonate at 24.  Na 136 Mg 1,9   Continue furosemide IV Add 2 g mag sulfate today, to keep Mg 2 or greater.   Anemia of chronic renal disease.  Stable hgb.   Atrial fibrillation ([name]) Persistent atrial fibrillation.  Patient has diagnosis of tachy brady syndrome and had a pacemaker in place.   Telemetry personally reviewed, persistent atrial fibrillation with occasional multifocal PCV, rate 80 to 90. Continue with low dose metoprolol for rate control.   Hypertension Continue midodrine for blood pressure support.   PVD (peripheral vascular disease) ([name]) Continue blood pressure support.  Type 2 diabetes mellitus without complication, without long-term current use of insulin ([name]) Continue glucose cover and monitoring with insulin sliding scale.  Fasting glucose this am is 161 mg/dl.   Alzheimer disease ([name]) No agitation, continue with memantine and depakote.   History of CVA (cerebrovascular accident) Continue blood pressure control.  Currently not on anticoagulation for atrial fibrillation.   Benign prostatic hyperplasia with urinary obstruction Continue to monitor urine output.  On proscar and flomax.  Gangrene of toe of left foot ([name]) Indicated intervention, but his family has not decided yet.          Subjective: Patient with improvement in dyspnea, no chest pain. He continue to have pericardial drain in place.   Physical Exam: Vitals:   10/10/22 0600 10/10/22 0700 10/10/22 0800 10/10/22 0900  BP: 99/75 101/78 (!) 83/66 (!) 85/59  Pulse:  (!) 108 100 (!) 106  Resp: (!) 23 18 18  (!) 22  Temp:   98.5 F (36.9 C)   TempSrc:   Oral   SpO2:  95% 100% 100%  Weight:      Height:       Neurology awake and alert ENT with mild pallor Cardiovascular with S1 and S2 present irregularly irregular with no gallops or rubs No JVD Positive lower extremity edema + Respiratory with no rales or wheezing on anterior auscultation Abdomen with no distention  Data Reviewed:    Family Communication: no family at the bedside   Disposition: Status is: Inpatient Remains inpatient appropriate because: pericardial drain in place,.   Planned Discharge Destination: Home     Author: Coralie Keens, MD 10/10/2022 9:57 AM  For on call review [email or web address].

## 2022-10-10 NOTE — Progress Notes (Addendum)
Progress Note  Patient Name: [name] Date of Encounter: 10/10/2022  Primary Cardiologist: None   Subjective   Patient seen examined his bedside.  He experiences shortness of breath slightly improved from prior.  Inpatient Medications    Scheduled Meds:  Chlorhexidine Gluconate Cloth  6 each Topical Daily   divalproex  125 mg Oral BID   docusate sodium  100 mg Oral Daily   finasteride  5 mg Oral Daily   furosemide  40 mg Intravenous Daily   heparin  5,000 Units Subcutaneous Q8H   insulin aspart  0-5 Units Subcutaneous QHS   insulin aspart  0-6 Units Subcutaneous TID WC   latanoprost  1 drop Both Eyes QHS   melatonin  3 mg Oral QHS   memantine  10 mg Oral BID   metoprolol tartrate  25 mg Oral BID   midodrine  2.5 mg Oral TID WC   pantoprazole  40 mg Oral Daily   sodium chloride flush  3 mL Intravenous Q12H   sodium chloride flush  3 mL Intravenous Q12H   tamsulosin  0.4 mg Oral Daily   Continuous Infusions:  sodium chloride Stopped (10/09/22 0955)   sodium chloride     PRN Meds: sodium chloride, acetaminophen **OR** acetaminophen, albuterol, ondansetron (ZOFRAN) IV, mouth rinse, sodium chloride flush   Vital Signs    Vitals:   10/10/22 0421 10/10/22 0500 10/10/22 0600 10/10/22 0700  BP:  93/76 99/75 101/78  Pulse:    (!) 108  Resp:  (!) 22 (!) 23 18  Temp: 97.7 F (36.5 C)     TempSrc: Oral     SpO2:    95%  Weight:  63.3 kg    Height:        Intake/Output Summary (Last 24 hours) at 10/10/2022 0805 Last data filed at 10/10/2022 0600 Gross per 24 hour  Intake 52.02 ml  Output 1165 ml  Net -1112.98 ml   Filed Weights   10/08/22 0416 10/09/22 0413 10/10/22 0500  Weight: 67.5 kg 66.1 kg 63.3 kg    Telemetry     - Personally Reviewed  ECG     - Personally Reviewed  Physical Exam     General:NAD Head: Atraumatic, normal size  Eyes: PEERLA, EOMI  Neck: Supple, normal JVD Cardiac: Normal S1, S2; RRR; no murmurs, rubs, or  gallops Lungs: Clear to auscultation bilaterally Abd: Soft, nontender, no hepatomegaly  Ext: warm, no edema Musculoskeletal: No deformities, BUE and BLE strength normal and equal Skin: Warm and dry, no rashes   Neuro: Alert and oriented to person, place,. Psych: Normal mood and affect   Labs    Chemistry Recent Labs  Lab 10/07/22 1110 10/08/22 0112 10/09/22 0043 10/10/22 0051  NA 139  142 138 136 136  K 4.4  4.4 4.5 4.4 4.7  CL 104  103 103 103 102  CO2 25 23 24 24   GLUCOSE 177*  175* 136* 129* 161*  BUN 27*  28* 31* 30* 30*  CREATININE 2.30*  2.30* 2.25* 2.30* 2.26*  CALCIUM 8.0* 8.3* 8.0* 8.1*  PROT 6.2*  --  5.6*  --   ALBUMIN 2.9*  --  2.6*  --   AST 15  --  14*  --   ALT 9  --  8  --   ALKPHOS 68  --  66  --   BILITOT 0.4  --  0.8  --   GFRNONAA 25* 26* 25* 26*  ANIONGAP 10 12 9  10     Hematology Recent Labs  Lab 10/09/22 0043 10/09/22 0703 10/10/22 0051  WBC 7.1 6.5 8.1  RBC 3.93* 3.97* 4.63  HGB 9.9* 10.1* 11.6*  HCT 30.0* 30.3* 35.3*  MCV 76.3* 76.3* 76.2*  MCH 25.2* 25.4* 25.1*  MCHC 33.0 33.3 32.9  RDW 17.5* 17.9* 17.7*  PLT 169 167 170    Cardiac EnzymesNo results for input(s): "TROPONINI" in the last 168 hours. No results for input(s): "TROPIPOC" in the last 168 hours.   BNP Recent Labs  Lab 10/07/22 1110  BNP 352.0*     DDimer No results for input(s): "DDIMER" in the last 168 hours.   Radiology    ECHOCARDIOGRAM LIMITED  Result Date: 10/09/2022    ECHOCARDIOGRAM LIMITED REPORT   Patient Name:   [name] [facility] Date of Exam: 10/09/2022 Medical Rec #:  [id number]             Height:       71.0 in Accession #:    [id number]            Weight:       145.7 lb Date of Birth:  [birth year]            BSA:          1.843 m Patient Age:    89 years              BP:           111/81 mmHg Patient Gender: M                     HR:           80 bpm. Exam Location:  Inpatient Procedure: Limited Echo and Echo Guidance/Pericardial Tap  Indications:    I31.3 Pericardial effusion  History:        Patient has prior history of Echocardiogram examinations, most                 recent 10/07/2022. CHF, Arrythmias:Atrial Fibrillation; Risk                 Factors:Hypertension and Diabetes.  Sonographer:    Irving Burton Senior RDCS Referring Phys: [id number] Corky Crafts  Sonographer Comments: Pericardiocentesis IMPRESSIONS  1. 2.5 cm pericardial effusion with RV collapse. Catheter noted in pericardial space, apical. Post tap, trivial pericardial effusion with normal RV function. Pleural effusion noted. Large pericardial effusion. The pericardial effusion is circumferential. FINDINGS  Pericardium: 2.5 cm pericardial effusion with RV collapse. Catheter noted in pericardial space, apical. Post tap, trivial pericardial effusion with normal RV function. Pleural effusion noted. A large pericardial effusion is present. The pericardial effusion is circumferential. Donato Schultz MD Electronically signed by Donato Schultz MD Signature Date/Time: 10/09/2022/11:54:56 AM    Final    CARDIAC CATHETERIZATION  Result Date: 10/09/2022   Successful pericardiocentesis with 1030 cc fluid removal.  Fluid was serosanguinous and somewhat dark. Will leave drain in place overnight.  Would hopefully be able to remove tomorrow based on drainage.  Results conveyed to granddaughter.    Cardiac Studies   TTE 10/10/2022 1. 2.5 cm pericardial effusion with RV collapse. Catheter noted in  pericardial space, apical. Post tap, trivial pericardial effusion with  normal RV function. Pleural effusion noted. Large pericardial effusion.  The pericardial effusion is  circumferential.   FINDINGS   Pericardium: 2.5 cm pericardial effusion with RV collapse. Catheter noted  in pericardial space, apical. Post tap, trivial pericardial effusion with  normal RV function. Pleural effusion noted. A large pericardial effusion  is present. The pericardial  effusion is circumferential.    TTE  10/07/2022 IMPRESSIONS   1. Left ventricular ejection fraction, by estimation, is 50 to 55%. The  left ventricle has low normal function. The left ventricle has no regional  wall motion abnormalities. There is severe left ventricular hypertrophy.  Left ventricular diastolic  parameters are indeterminate.   2. Right ventricular systolic function is normal. The right ventricular  size is normal. There is mildly elevated pulmonary artery systolic  pressure.   3. Left atrial size was severely dilated.   4. Large pericardial effusion. There is no evidence of cardiac tamponade.   5. The mitral valve was not well visualized. Trivial mitral valve  regurgitation. No evidence of mitral stenosis.   6. The aortic valve is tricuspid. There is mild calcification of the  aortic valve. There is mild thickening of the aortic valve. Aortic valve  regurgitation is trivial. Aortic valve sclerosis is present, with no  evidence of aortic valve stenosis.   7. The inferior vena cava is dilated in size with >50% respiratory  variability, suggesting right atrial pressure of 8 mmHg.   Comparison(s): Changes from prior study are noted. Pericardial effusion is  larger than prior, though no echo evidence of tamponade.   FINDINGS   Left Ventricle: Left ventricular ejection fraction, by estimation, is 50  to 55%. The left ventricle has low normal function. The left ventricle has  no regional wall motion abnormalities. Definity contrast agent was given  IV to delineate the left  ventricular endocardial borders. The left ventricular internal cavity size  was normal in size. There is severe left ventricular hypertrophy. Left  ventricular diastolic parameters are indeterminate.   Right Ventricle: The right ventricular size is normal. Right vetricular  wall thickness was not well visualized. Right ventricular systolic  function is normal. There is mildly elevated pulmonary artery systolic  pressure. The tricuspid  regurgitant velocity   is 2.78 m/s, and with an assumed right atrial pressure of 8 mmHg, the  estimated right ventricular systolic pressure is 38.9 mmHg.   Left Atrium: Left atrial size was severely dilated.   Right Atrium: Right atrial size was normal in size.   Pericardium: Large pericardial effusion, maximum diameter 3.72 cm  posterior to LV. Adherent material seen in pericardial fluid, but fluid  generally low density. A large pericardial effusion is present. There is  no evidence of cardiac tamponade.   Mitral Valve: The mitral valve was not well visualized. Trivial mitral  valve regurgitation. No evidence of mitral valve stenosis.   Tricuspid Valve: The tricuspid valve is normal in structure. Tricuspid  valve regurgitation is mild . No evidence of tricuspid stenosis.   Aortic Valve: The aortic valve is tricuspid. There is mild calcification  of the aortic valve. There is mild thickening of the aortic valve. Aortic  valve regurgitation is trivial. Aortic valve sclerosis is present, with no  evidence of aortic valve  stenosis. Aortic valve peak gradient measures 2.6 mmHg.   Pulmonic Valve: The pulmonic valve was not well visualized. Pulmonic valve  regurgitation is not visualized. No evidence of pulmonic stenosis.   Aorta: The aortic root, ascending aorta, aortic arch and descending aorta  are all structurally normal, with no evidence of dilitation or  obstruction.   Venous: The inferior vena cava is dilated in size with greater than 50%  respiratory variability, suggesting right atrial pressure of 8 mmHg.  IAS/Shunts: The atrial septum is grossly normal.   Patient Profile     89 y.o. male  CVA, atrial fibrillation, Type II diabetes mellitus, peripheral artery disease endovascular intervention of the occluded left popliteal artery and TP trunk using a retrograde access via the left posterior tibial artery, symptomatic bradycardia status post pacemaker implantation, chronic  kidney disease   Assessment & Plan      Large pericardial effusion Shortness of breath  Elevated trop - flat, no need to trent this Bilateral pleural effusion  Permanent atrial fibrillation on chronic anticoagulation  Tachy-brady syndrome status post pacemaker implantation  PAD Chronic Kidney disease stage IV Diabetes Mellitus  Dementia    He is status post pericardiocentesis total output 1030 cc, drain is still in looks like approximately close to 160cc out with the drain.  Echocardiogram performed yesterday reviewed.  Limited echo for tomorrow.  Kidney function stable, nephrology following  Diabetes mellitus managed by the primary team.  Troponin flat no angina no need to pursue any ischemic evaluation.    For questions or updates, please contact [facility] Please consult [email or web address] for contact info under Cardiology/STEMI.      Signed, Thomasene Ripple, DO  10/10/2022, 8:05 AM

## 2022-10-10 NOTE — Progress Notes (Signed)
Initial Nutrition Assessment  DOCUMENTATION CODES:   Non-severe (moderate) malnutrition in context of chronic illness  INTERVENTION:   Liberalize diet to REGULAR. Pt is edentulous but has tolerated regular consistency foods thus far. If chewing issues occur, recommend downgrading to Dysphagia 3 (Mechanical Soft/Thins)  Magic Cup BID with meals, each supplement provides 290 kcal and 9 grams of protein  Ensure Enlive po daily as bedtime snack, each supplement provides 350 kcal and 20 grams of protein.   NUTRITION DIAGNOSIS:   Moderate Malnutrition related to chronic illness as evidenced by mild fat depletion, moderate fat depletion, severe fat depletion, severe muscle depletion, moderate muscle depletion.  GOAL:   Patient will meet greater than or equal to 90% of their needs  MONITOR:   PO intake, Supplement acceptance, Labs, Weight trends  REASON FOR ASSESSMENT:   Other (Comment) (Malnutrition Dx, Pressure Injury)    ASSESSMENT:   89 yo male admitted with SOB with acute on chronic diastolic CHF, large pericardial effusion. Pt also with gangrene of left toe PMH includes CKD IV, DM, HFpEF, CVA, PAD. Pt uses a wheelchair or walker at baseline, pt lives at home and has 24/7 caregivers.  5/22 Pericardiocentesis with 1030 mL dark serosanguinous fluid removed  Pt alert on visit today, reports extreme itching on his back and asking for someone to scratch his back. RN notified. Pt very HOH. Difficult to get very good history from patient at this time. Pt also with dementia at baseline.   Pt ate a good breakfast this AM, getting ready to eat lunch on visit today. Pt is edentulous but pt denies any problems chewing or swallowing. Pt ate spaghetti yesterday without difficulty. Plan to continue regular texture food for now but RN aware that if pt difficulty chewing certain foods can downgrade diet to Dysphagia 3  Weight appears to have trended down since Jan/Feb of this year. Current wt  63.3 kg. Recorded weights of 72-23 kg in Jan/Feb, 67-69 kg in March/April. Admission wt 67.3 kg. Net negative 3L with at least 1 unmeasured urine occurrence.   Labs: BUN 30, Creatinine 2.26, CBGs 70-175, Hgb A1c 7.2 Meds: lasix, ss novolog with meals and at bedtime  NUTRITION - FOCUSED PHYSICAL EXAM:  Flowsheet Row Most Recent Value  Orbital Region Mild depletion  Upper Arm Region Moderate depletion  Thoracic and Lumbar Region Severe depletion  Buccal Region Mild depletion  Temple Region Moderate depletion  Clavicle Bone Region Severe depletion  Clavicle and Acromion Bone Region Severe depletion  Scapular Bone Region Severe depletion  Dorsal Hand Moderate depletion  Patellar Region Severe depletion  Anterior Thigh Region Severe depletion  Posterior Calf Region Severe depletion  Edema (RD Assessment) None  Hair Reviewed  Mouth Reviewed  [edentulous]  Skin Reviewed  [dry, flaky]  Nails Reviewed       Diet Order:   Diet Order             Diet regular Room service appropriate? Yes with Assist; Fluid consistency: Thin  Diet effective now                   EDUCATION NEEDS:   Not appropriate for education at this time  Skin:  Skin Assessment: Skin Integrity Issues: Skin Integrity Issues:: Other (Comment) Other: non-pressure wound to left toe, necrotic  Last BM:  PTA  Height:   Ht Readings from Last 1 Encounters:  10/07/22 5\' 11"  (1.803 m)    Weight:   Wt Readings from Last 1 Encounters:  10/10/22 63.3 kg     BMI:  Body mass index is 19.46 kg/m.  Estimated Nutritional Needs:   Kcal:  1500-1700 kcals  Protein:  70-80 g  Fluid:  >/= 1.5 L   Romelle Starcher MS, RDN, LDN, CNSC Registered Dietitian 3 Clinical Nutrition RD Pager and On-Call Pager Number Located in [place]

## 2022-10-11 DIAGNOSIS — I5033 Acute on chronic diastolic (congestive) heart failure: Secondary | ICD-10-CM | POA: Diagnosis not present

## 2022-10-11 DIAGNOSIS — I3139 Other pericardial effusion (noninflammatory): Secondary | ICD-10-CM | POA: Diagnosis not present

## 2022-10-11 DIAGNOSIS — F0280 Dementia in other diseases classified elsewhere without behavioral disturbance: Secondary | ICD-10-CM

## 2022-10-11 DIAGNOSIS — G309 Alzheimer's disease, unspecified: Secondary | ICD-10-CM

## 2022-10-11 DIAGNOSIS — I10 Essential (primary) hypertension: Secondary | ICD-10-CM | POA: Diagnosis not present

## 2022-10-11 DIAGNOSIS — I4891 Unspecified atrial fibrillation: Secondary | ICD-10-CM | POA: Diagnosis not present

## 2022-10-11 LAB — GLUCOSE, CAPILLARY
Glucose-Capillary: 124 mg/dL — ABNORMAL HIGH (ref 70–99)
Glucose-Capillary: 198 mg/dL — ABNORMAL HIGH (ref 70–99)
Glucose-Capillary: 122 mg/dL — ABNORMAL HIGH (ref 70–99)
Glucose-Capillary: 150 mg/dL — ABNORMAL HIGH (ref 70–99)

## 2022-10-11 LAB — RENAL FUNCTION PANEL
Albumin: 2.1 g/dL — ABNORMAL LOW (ref 3.5–5.0)
Anion gap: 13 (ref 5–15)
BUN: 36 mg/dL — ABNORMAL HIGH (ref 8–23)
CO2: 24 mmol/L (ref 22–32)
Calcium: 7.9 mg/dL — ABNORMAL LOW (ref 8.9–10.3)
Chloride: 99 mmol/L (ref 98–111)
Creatinine, Ser: 2.35 mg/dL — ABNORMAL HIGH (ref 0.61–1.24)
GFR, Estimated: 25 mL/min — ABNORMAL LOW (ref >60)
Glucose, Bld: 194 mg/dL — ABNORMAL HIGH (ref 70–99)
Phosphorus: 3.6 mg/dL (ref 2.5–4.6)
Potassium: 4.4 mmol/L (ref 3.5–5.1)
Sodium: 136 mmol/L (ref 135–145)

## 2022-10-11 LAB — CBC
HCT: 33.2 % — ABNORMAL LOW (ref 39.0–52.0)
Hemoglobin: 11 g/dL — ABNORMAL LOW (ref 13.0–17.0)
MCH: 24.8 pg — ABNORMAL LOW (ref 26.0–34.0)
MCHC: 33.1 g/dL (ref 30.0–36.0)
MCV: 74.9 fL — ABNORMAL LOW (ref 80.0–100.0)
Platelets: 166 10*3/uL (ref 150–400)
RBC: 4.43 MIL/uL (ref 4.22–5.81)
RDW: 17.4 % — ABNORMAL HIGH (ref 11.5–15.5)
WBC: 9.4 10*3/uL (ref 4.0–10.5)
nRBC: 0 % (ref 0.0–0.2)

## 2022-10-11 LAB — CULTURE, BODY FLUID W GRAM STAIN -BOTTLE

## 2022-10-11 LAB — LIPOPROTEIN A (LPA): Lipoprotein (a): 209.5 nmol/L — ABNORMAL HIGH (ref <75.0)

## 2022-10-11 MED ORDER — MIDODRINE HCL 5 MG PO TABS: 10.0000 mg | ORAL_TABLET | Freq: Two times a day (BID) | ORAL | Status: DC

## 2022-10-11 MED ORDER — APIXABAN 2.5 MG PO TABS
2.5000 mg | ORAL_TABLET | Freq: Two times a day (BID) | ORAL | Status: DC
  Administered 2022-10-11 – 2022-10-16 (×11): 2.5 mg via ORAL
  Filled 2022-10-11 (×11): qty 1

## 2022-10-11 MED ORDER — MIDODRINE HCL 5 MG PO TABS
10.0000 mg | ORAL_TABLET | Freq: Two times a day (BID) | ORAL | Status: DC
  Administered 2022-10-11 – 2022-10-12 (×3): 10 mg via ORAL
  Filled 2022-10-11 (×3): qty 2

## 2022-10-11 MED ORDER — SODIUM CHLORIDE 0.9 % IV BOLUS
250.0000 mL | Freq: Once | INTRAVENOUS | Status: AC
  Administered 2022-10-11: 250 mL via INTRAVENOUS

## 2022-10-11 MED ORDER — SODIUM CHLORIDE 0.9 % IV BOLUS
500.0000 mL | Freq: Once | INTRAVENOUS | Status: AC
  Administered 2022-10-11: 500 mL via INTRAVENOUS

## 2022-10-11 MED ORDER — FUROSEMIDE 40 MG PO TABS: 40.0000 mg | ORAL_TABLET | Freq: Every day | ORAL | Status: DC

## 2022-10-11 NOTE — Progress Notes (Addendum)
Progress Note   Patient: [name] UJW:[id number] DOB: 06-Jun-1923 DOA: 10/07/2022     4 DOS: the patient was seen and examined on 10/11/2022   Brief hospital course: [name] was hospitalized with the working diagnosis of heart failure decompensation.   89 y.o. male with medical history significant of hypertension, heart failure with preserved EF, atrial fibrillation, tachybradycardia s/p pacemaker, CVA, peripheral arterial disease, CKD stage IV, diabetes mellitus type 2 who presents with complaints of shortness of breath.  At baseline patient ambulates with use of a rollator or wheelchair and family reports that he has 24-hour care.  Over the last 3 days patient had reported having progressively worsening shortness of breath mostly with exertion, but sometimes at rest.    In the emergency department patient was noted to be afebrile with respirations 12-23, and all other vital signs maintained. Lungs with no wheezing or rhonchi, heart with S1 and S2 present irregularly irregular with no gallops or murmur, abdomen with no distention and positive lower extremity edema. Left foot great toes with necrotic tissue.   Labs significant for WBC 7, hemoglobin 9.6, BUN 28,  creatinine 2.3, ionized calcium 1.11, and BNP 352.    Chest radiograph with cardiomegaly, bilateral hilar vascular congestion, bilateral pleural effusions more left than right. Pacemaker in place with one lead in the right atrium and one in the right ventricle.   Patient has been given IV furosemide. Cardiology had been formally consulted.  Echocardiogram with large pericardial effusion with signs of tamponade. 05/22 pericardiocentesis 1 L removed and catheter left in place.  05/23 continue pericardiocentesis catheter in place.  05/24 pericardiocentesis drain has been,   Assessment and Plan: Acute on chronic diastolic CHF (congestive heart failure) ([name]) Echocardiogram with preserved LV systolic function with EF 50  to 55%, severe LVH, RVSP 38.9. LA with severe dilatation. Large pericardial effusion.   Documented urine output is 1000  ml. His fluid balance is negative since admission 3,999 ml.  Blood pressure has been low with systolic in th 80's with MAP 63 to 70.   Discontinue furosemide. Add 250 cc IV isotonic saline bolus.  Midodrine dose has been increased to 10 mg bid.  Limited pharmacologic therapy due to reduced GFR.   Pericardial effusion Large pericardial effusion.  05/22 Sp pericardiocentesis 1030 cc fluid removed, serosanguinous and somewhat dark.  Echocardiogram with RV collapse on echocardiogram.   Today cathter has been removed.  Continue telemetry monitoring, because hypotension will keep patient in progressive care unit today.     CKD (chronic kidney disease), stage IV ([name]) Renal function with serum cr stable at 2,35 with K at 4,4 and serum bicarbonate at 24. Na 136.   Continue close monitoring of renal function and electrolytes.  Avoid hypotension and nephrotoxic medications.   Anemia of chronic renal disease.  Stable hgb.   Atrial fibrillation ([name]) Persistent atrial fibrillation.  Patient has diagnosis of tachy brady syndrome and had a pacemaker in place.   Telemetry personally reviewed, persistent atrial fibrillation rate 80 to 90. Resume apixaban for anticoagulation,  Continue with low dose metoprolol for rate control.   Hypertension Continue midodrine for blood pressure support.   PVD (peripheral vascular disease) ([name]) Continue blood pressure support.  Type 2 diabetes mellitus without complication, without long-term current use of insulin ([name]) Continue glucose cover and monitoring with insulin sliding scale.  Fasting glucose this am is 194 mg/dl.   Alzheimer disease ([name]) No agitation, continue with memantine and depakote.   History of  CVA (cerebrovascular accident) Continue blood pressure control.  Currently not on anticoagulation for atrial  fibrillation.   Benign prostatic hyperplasia with urinary obstruction Continue to monitor urine output.  On proscar and flomax.   Gangrene of toe of left foot ([name]) Indicated intervention, but his family has not decided yet.         Subjective: Patient with no chest pain or dyspnea, no PND or orthopnea.   Physical Exam: Vitals:   10/11/22 1300 10/11/22 1308 10/11/22 1315 10/11/22 1330  BP: (!) 84/61 (!) 75/56 (!) 79/54 (!) 81/64  Pulse: (!) 105 (!) 120  (!) 104  Resp: 18  (!) 29 (!) 46  Temp:      TempSrc:      SpO2: 95% 99%  95%  Weight:      Height:       Neurology awake and alert ENT with mild pallor Cardiovascular with S1 and S2 present and rhythmic with no gallops, rubs or murmurs No JVD No lower extremity edema Respiratory with no rales or wheezing, no rhonchi Abdomen with no distention  Data Reviewed:    Family Communication: no family at the bedside   Disposition: Status is: Inpatient Remains inpatient appropriate because: pericardial effusion   Planned Discharge Destination: Home    Author: Coralie Keens, MD 10/11/2022 1:49 PM  For on call review [email or web address].

## 2022-10-11 NOTE — Progress Notes (Signed)
Progress Note  Patient Name: [name] Date of Encounter: 10/11/2022  Primary Cardiologist: None   Subjective   Patient seen examined his bedside.  He experiences shortness of breath slightly improved from prior.  Inpatient Medications    Scheduled Meds:  Chlorhexidine Gluconate Cloth  6 each Topical Daily   divalproex  125 mg Oral BID   docusate sodium  100 mg Oral Daily   feeding supplement  237 mL Oral QHS   finasteride  5 mg Oral Daily   furosemide  40 mg Intravenous Daily   heparin  5,000 Units Subcutaneous Q8H   insulin aspart  0-5 Units Subcutaneous QHS   insulin aspart  0-6 Units Subcutaneous TID WC   latanoprost  1 drop Both Eyes QHS   melatonin  3 mg Oral QHS   memantine  10 mg Oral BID   metoprolol tartrate  25 mg Oral BID   midodrine  2.5 mg Oral TID WC   pantoprazole  40 mg Oral Daily   sodium chloride flush  3 mL Intravenous Q12H   sodium chloride flush  3 mL Intravenous Q12H   tamsulosin  0.4 mg Oral Daily   Continuous Infusions:  sodium chloride Stopped (10/09/22 0955)   sodium chloride     PRN Meds: sodium chloride, acetaminophen **OR** acetaminophen, albuterol, ondansetron (ZOFRAN) IV, mouth rinse, sodium chloride flush   Vital Signs    Vitals:   10/11/22 0400 10/11/22 0500 10/11/22 0600 10/11/22 0700  BP: (!) 86/66 93/60 108/82 101/73  Pulse: 99 97 (!) 106 (!) 108  Resp: 11 17 (!) 38 12  Temp:      TempSrc:      SpO2: 100% 99% 99% 98%  Weight:  61.4 kg    Height:        Intake/Output Summary (Last 24 hours) at 10/11/2022 0740 Last data filed at 10/11/2022 0700 Gross per 24 hour  Intake 150 ml  Output 1007 ml  Net -857 ml   Filed Weights   10/09/22 0413 10/10/22 0500 10/11/22 0500  Weight: 66.1 kg 63.3 kg 61.4 kg    Telemetry     - Personally Reviewed  ECG     - Personally Reviewed  Physical Exam     General:NAD Head: Atraumatic, normal size  Eyes: PEERLA, EOMI  Neck: Supple, normal JVD Cardiac: Normal S1,  S2; RRR; no murmurs, rubs, or gallops Lungs: Clear to auscultation bilaterally Abd: Soft, nontender, no hepatomegaly  Ext: warm, no edema Musculoskeletal: No deformities, BUE and BLE strength normal and equal Skin: Warm and dry, no rashes   Neuro: Alert and oriented to person, place,. Psych: Normal mood and affect   Labs    Chemistry Recent Labs  Lab 10/07/22 1110 10/08/22 0112 10/09/22 0043 10/10/22 0051  NA 139  142 138 136 136  K 4.4  4.4 4.5 4.4 4.7  CL 104  103 103 103 102  CO2 25 23 24 24   GLUCOSE 177*  175* 136* 129* 161*  BUN 27*  28* 31* 30* 30*  CREATININE 2.30*  2.30* 2.25* 2.30* 2.26*  CALCIUM 8.0* 8.3* 8.0* 8.1*  PROT 6.2*  --  5.6*  --   ALBUMIN 2.9*  --  2.6*  --   AST 15  --  14*  --   ALT 9  --  8  --   ALKPHOS 68  --  66  --   BILITOT 0.4  --  0.8  --   GFRNONAA 25* 26*  25* 26*  ANIONGAP 10 12 9 10      Hematology Recent Labs  Lab 10/09/22 0703 10/10/22 0051 10/11/22 0141  WBC 6.5 8.1 9.4  RBC 3.97* 4.63 4.43  HGB 10.1* 11.6* 11.0*  HCT 30.3* 35.3* 33.2*  MCV 76.3* 76.2* 74.9*  MCH 25.4* 25.1* 24.8*  MCHC 33.3 32.9 33.1  RDW 17.9* 17.7* 17.4*  PLT 167 170 166    Cardiac EnzymesNo results for input(s): "TROPONINI" in the last 168 hours. No results for input(s): "TROPIPOC" in the last 168 hours.   BNP Recent Labs  Lab 10/07/22 1110  BNP 352.0*     DDimer No results for input(s): "DDIMER" in the last 168 hours.   Radiology    ECHOCARDIOGRAM LIMITED  Result Date: 10/10/2022    ECHOCARDIOGRAM LIMITED REPORT   Patient Name:   [name] Date of Exam: 10/10/2022 Medical Rec #:  [id number]             Height:       71.0 in Accession #:    [id number]            Weight:       139.6 lb Date of Birth:  26-Jan-1924            BSA:          1.810 m Patient Age:    89 years              BP:           84/67 mmHg Patient Gender: M                     HR:           83 bpm. Exam Location:  Inpatient Procedure: Limited Echo Indications:     Pericardial Effusion, recheck with pericardial drain in place.  History:        Patient has prior history of Echocardiogram examinations, most                 recent 10/09/2022.  Sonographer:    Irving Burton Senior RDCS Referring Phys: Thomasene Ripple  Sonographer Comments: Technically difficult due to thin body habitus. IMPRESSIONS  1. Technically difficult study with limited visualization of cardiac structures.  2. The LV is not well visualized, however, on limited views appears to have mild-moderate LVH with mildly reduced systolic function (EF 45%-50%).  3. Right ventricular systolic function was not well visualized, however, appears normal.  4. There is a fibrinous and localized pericardial effusion measuring 1.53cm adjacent to the RA/RV. The IVC now measures 1.53 cm. There are no obvious signs of tamponade. Compared to prior echocardiogram (10/09/22) there has been significant decrease in the effusion. FINDINGS  Left Ventricle: Left ventricular endocardial border not optimally defined to evaluate regional wall motion. Right Ventricle: Right ventricular systolic function was not well visualized. Left Atrium: Left atrial size was not well visualized. Pericardium: Fibrinous pericardial effusion measuring up to 1.53cm. A moderately sized pericardial effusion is present. The pericardial effusion appears to contain fibrous material. Mitral Valve: The mitral valve was not assessed. Tricuspid Valve: The tricuspid valve is not assessed. Aortic Valve: The aortic valve was not assessed. Pulmonic Valve: The pulmonic valve was not assessed. Aorta: Aortic root could not be assessed. Venous: The inferior vena cava is normal in size with greater than 50% respiratory variability, suggesting right atrial pressure of 3 mmHg. Additional Comments: A device lead is visualized.  Dorthula Nettles Electronically signed by Eliezer Lofts  Sabharwal Signature Date/Time: 10/10/2022/3:27:36 PM    Final    ECHOCARDIOGRAM LIMITED  Result Date: 10/09/2022     ECHOCARDIOGRAM LIMITED REPORT   Patient Name:   [name] [facility] Date of Exam: 10/09/2022 Medical Rec #:  [id number]             Height:       71.0 in Accession #:    [id number]            Weight:       145.7 lb Date of Birth:  11-03-1923            BSA:          1.843 m Patient Age:    89 years              BP:           111/81 mmHg Patient Gender: M                     HR:           80 bpm. Exam Location:  Inpatient Procedure: Limited Echo and Echo Guidance/Pericardial Tap Indications:    I31.3 Pericardial effusion  History:        Patient has prior history of Echocardiogram examinations, most                 recent 10/07/2022. CHF, Arrythmias:Atrial Fibrillation; Risk                 Factors:Hypertension and Diabetes.  Sonographer:    Irving Burton Senior RDCS Referring Phys: [id number] Corky Crafts  Sonographer Comments: Pericardiocentesis IMPRESSIONS  1. 2.5 cm pericardial effusion with RV collapse. Catheter noted in pericardial space, apical. Post tap, trivial pericardial effusion with normal RV function. Pleural effusion noted. Large pericardial effusion. The pericardial effusion is circumferential. FINDINGS  Pericardium: 2.5 cm pericardial effusion with RV collapse. Catheter noted in pericardial space, apical. Post tap, trivial pericardial effusion with normal RV function. Pleural effusion noted. A large pericardial effusion is present. The pericardial effusion is circumferential. Donato Schultz MD Electronically signed by Donato Schultz MD Signature Date/Time: 10/09/2022/11:54:56 AM    Final    CARDIAC CATHETERIZATION  Result Date: 10/09/2022   Successful pericardiocentesis with 1030 cc fluid removal.  Fluid was serosanguinous and somewhat dark. Will leave drain in place overnight.  Would hopefully be able to remove tomorrow based on drainage.  Results conveyed to granddaughter.    Cardiac Studies   TTE 10/11/2022 IMPRESSIONS     1. Technically difficult study with limited visualization of cardiac   structures.   2. The LV is not well visualized, however, on limited views appears to  have mild-moderate LVH with mildly reduced systolic function (EF 45%-50%).   3. Right ventricular systolic function was not well visualized, however,  appears normal.   4. There is a fibrinous and localized pericardial effusion measuring  1.53cm adjacent to the RA/RV. The IVC now measures 1.53 cm. There are no  obvious signs of tamponade. Compared to prior echocardiogram (10/09/22)  there has been significant decrease in  the effusion.   FINDINGS   Left Ventricle: Left ventricular endocardial border not optimally defined  to evaluate regional wall motion.   Right Ventricle: Right ventricular systolic function was not well  visualized.   Left Atrium: Left atrial size was not well visualized.   Pericardium: Fibrinous pericardial effusion measuring up to 1.53cm. A  moderately sized pericardial effusion is present. The pericardial effusion  appears to contain fibrous material.   Mitral Valve: The mitral valve was not assessed.   Tricuspid Valve: The tricuspid valve is not assessed.   Aortic Valve: The aortic valve was not assessed.   Pulmonic Valve: The pulmonic valve was not assessed.   Aorta: Aortic root could not be assessed.   Venous: The inferior vena cava is normal in size with greater than 50%  respiratory variability, suggesting right atrial pressure of 3 mmHg.   Additional Comments: A device lead is visualized.      TTE 10/10/2022 1. 2.5 cm pericardial effusion with RV collapse. Catheter noted in  pericardial space, apical. Post tap, trivial pericardial effusion with  normal RV function. Pleural effusion noted. Large pericardial effusion.  The pericardial effusion is  circumferential.   FINDINGS   Pericardium: 2.5 cm pericardial effusion with RV collapse. Catheter noted  in pericardial space, apical. Post tap, trivial pericardial effusion with  normal RV function. Pleural  effusion noted. A large pericardial effusion  is present. The pericardial  effusion is circumferential.    TTE 10/07/2022 IMPRESSIONS   1. Left ventricular ejection fraction, by estimation, is 50 to 55%. The  left ventricle has low normal function. The left ventricle has no regional  wall motion abnormalities. There is severe left ventricular hypertrophy.  Left ventricular diastolic  parameters are indeterminate.   2. Right ventricular systolic function is normal. The right ventricular  size is normal. There is mildly elevated pulmonary artery systolic  pressure.   3. Left atrial size was severely dilated.   4. Large pericardial effusion. There is no evidence of cardiac tamponade.   5. The mitral valve was not well visualized. Trivial mitral valve  regurgitation. No evidence of mitral stenosis.   6. The aortic valve is tricuspid. There is mild calcification of the  aortic valve. There is mild thickening of the aortic valve. Aortic valve  regurgitation is trivial. Aortic valve sclerosis is present, with no  evidence of aortic valve stenosis.   7. The inferior vena cava is dilated in size with >50% respiratory  variability, suggesting right atrial pressure of 8 mmHg.   Comparison(s): Changes from prior study are noted. Pericardial effusion is  larger than prior, though no echo evidence of tamponade.   FINDINGS   Left Ventricle: Left ventricular ejection fraction, by estimation, is 50  to 55%. The left ventricle has low normal function. The left ventricle has  no regional wall motion abnormalities. Definity contrast agent was given  IV to delineate the left  ventricular endocardial borders. The left ventricular internal cavity size  was normal in size. There is severe left ventricular hypertrophy. Left  ventricular diastolic parameters are indeterminate.   Right Ventricle: The right ventricular size is normal. Right vetricular  wall thickness was not well visualized. Right  ventricular systolic  function is normal. There is mildly elevated pulmonary artery systolic  pressure. The tricuspid regurgitant velocity   is 2.78 m/s, and with an assumed right atrial pressure of 8 mmHg, the  estimated right ventricular systolic pressure is 38.9 mmHg.   Left Atrium: Left atrial size was severely dilated.   Right Atrium: Right atrial size was normal in size.   Pericardium: Large pericardial effusion, maximum diameter 3.72 cm  posterior to LV. Adherent material seen in pericardial fluid, but fluid  generally low density. A large pericardial effusion is present. There is  no evidence of cardiac tamponade.   Mitral Valve: The mitral valve was not well visualized. Trivial mitral  valve regurgitation. No evidence of mitral valve stenosis.   Tricuspid Valve: The tricuspid valve is normal in structure. Tricuspid  valve regurgitation is mild . No evidence of tricuspid stenosis.   Aortic Valve: The aortic valve is tricuspid. There is mild calcification  of the aortic valve. There is mild thickening of the aortic valve. Aortic  valve regurgitation is trivial. Aortic valve sclerosis is present, with no  evidence of aortic valve  stenosis. Aortic valve peak gradient measures 2.6 mmHg.   Pulmonic Valve: The pulmonic valve was not well visualized. Pulmonic valve  regurgitation is not visualized. No evidence of pulmonic stenosis.   Aorta: The aortic root, ascending aorta, aortic arch and descending aorta  are all structurally normal, with no evidence of dilitation or  obstruction.   Venous: The inferior vena cava is dilated in size with greater than 50%  respiratory variability, suggesting right atrial pressure of 8 mmHg.   IAS/Shunts: The atrial septum is grossly normal.   Patient Profile     89 y.o. male  CVA, atrial fibrillation, Type II diabetes mellitus, peripheral artery disease endovascular intervention of the occluded left popliteal artery and TP trunk using a  retrograde access via the left posterior tibial artery, symptomatic bradycardia status post pacemaker implantation, chronic kidney disease   Assessment & Plan      Large pericardial effusion Shortness of breath  Elevated trop - flat, no need to trent this Bilateral pleural effusion  Permanent atrial fibrillation on chronic anticoagulation  Tachy-brady syndrome status post pacemaker implantation  PAD Chronic Kidney disease stage IV Diabetes Mellitus  Dementia    He is status post pericardiocentesis total output 1030 cc- drain in for 24 hrs, removed today at the bedside. No complications.   Echo yesterday showed decrease in Pericardial effusion.   Kidney function stable, nephrology following  Diabetes mellitus managed by the primary team.  Troponin flat no angina no need to pursue any ischemic evaluation.    For questions or updates, please contact [facility] Please consult [email or web address] for contact info under Cardiology/STEMI.      Signed, Thomasene Ripple, DO  10/11/2022, 7:40 AM

## 2022-10-11 NOTE — Evaluation (Addendum)
Physical Therapy Evaluation/ discharge Patient Details Name: [name] MRN: [id number] DOB: 03/22/1924 Today's Date: 10/11/2022  History of Present Illness  89 yo male admitted 5/20 with SOb and pericardial effusion. 5/22 pericardiocentesis. PMhx: dementia, HTN, CHF, Afib, PPM, CVA, PAD, CKD, T2DM  Clinical Impression  Pt pleasant, HOH and walks with RW at baseline with 24hr caregivers. Pt with one recent fall per caregiver and able to walk into church on Sundays. Pt with assist for all ADLs and caregivers perform IADLs at baseline. Caregiver present to confirm pt at baseline functional level without need for DME or further therapy at this time. Will sign off and encouraged daily ambulation acutely.   HR 98-120 SPO2 90-98% RA Pre gait 76/63 (67) Post gait 75/56 (63)       Recommendations for follow up therapy are one component of a multi-disciplinary discharge planning process, led by the attending physician.  Recommendations may be updated based on patient status, additional functional criteria and insurance authorization.  Follow Up Recommendations       Assistance Recommended at Discharge Frequent or constant Supervision/Assistance  Patient can return home with the following  A little help with walking and/or transfers;A little help with bathing/dressing/bathroom;Assistance with cooking/housework;Direct supervision/assist for financial management;Assist for transportation    Equipment Recommendations None recommended by PT  Recommendations for Other Services       Functional Status Assessment Patient has not had a recent decline in their functional status     Precautions / Restrictions Precautions Precautions: Fall      Mobility  Bed Mobility               General bed mobility comments: in chair on arrival    Transfers Overall transfer level: Needs assistance   Transfers: Sit to/from Stand Sit to Stand: Min assist           General transfer  comment: cues for hand placement and to back fully to surface, min assist to power up    Ambulation/Gait Ambulation/Gait assistance: Min guard Gait Distance (Feet): 80 Feet Assistive device: Rolling walker (2 wheels) Gait Pattern/deviations: Step-through pattern, Decreased stride length, Trunk flexed   Gait velocity interpretation: <1.8 ft/sec, indicate of risk for recurrent falls   General Gait Details: cues for posture and proximity to RW, pt able to state fatigue  Stairs            Wheelchair Mobility    Modified Rankin (Stroke Patients Only)       Balance Overall balance assessment: History of Falls, Needs assistance   Sitting balance-Leahy Scale: Fair     Standing balance support: Bilateral upper extremity supported, During functional activity, Reliant on assistive device for balance Standing balance-Leahy Scale: Poor Standing balance comment: RW in standing                             Pertinent Vitals/Pain Pain Assessment Pain Assessment: No/denies pain    Home Living Family/patient expects to be discharged to:: Private residence Living Arrangements: Non-relatives/Friends Available Help at Discharge: Personal care attendant;Available 24 hours/day Type of Home: House Home Access: Ramped entrance       Home Layout: One level Home Equipment: Cane - single Librarian, academic (2 wheels);BSC/3in1;Wheelchair - manual;Hospital bed Additional Comments: Caregiver present and gave information    Prior Function Prior Level of Function : Needs assist             Mobility Comments: RW at all times,  min assist for transfers ADLs Comments: Pt requires assist for ADL's at baseline level, has 24 hour care with nursing     Hand Dominance        Extremity/Trunk Assessment   Upper Extremity Assessment Upper Extremity Assessment: Generalized weakness    Lower Extremity Assessment Lower Extremity Assessment: Generalized weakness    Cervical  / Trunk Assessment Cervical / Trunk Assessment: Kyphotic  Communication   Communication: HOH  Cognition Arousal/Alertness: Awake/alert Behavior During Therapy: Flat affect Overall Cognitive Status: History of cognitive impairments - at baseline                                          General Comments      Exercises     Assessment/Plan    PT Assessment Patient does not need any further PT services  PT Problem List         PT Treatment Interventions      PT Goals (Current goals can be found in the Care Plan section)  Acute Rehab PT Goals PT Goal Formulation: All assessment and education complete, DC therapy    Frequency       Co-evaluation               AM-PAC PT "6 Clicks" Mobility  Outcome Measure Help needed turning from your back to your side while in a flat bed without using bedrails?: None Help needed moving from lying on your back to sitting on the side of a flat bed without using bedrails?: None Help needed moving to and from a bed to a chair (including a wheelchair)?: A Little Help needed standing up from a chair using your arms (e.g., wheelchair or bedside chair)?: A Little Help needed to walk in hospital room?: A Little Help needed climbing 3-5 steps with a railing? : A Lot 6 Click Score: 19    End of Session Equipment Utilized During Treatment: Gait belt Activity Tolerance: Patient tolerated treatment well Patient left: in chair;with call bell/phone within reach;with chair alarm set;with family/visitor present Nurse Communication: Mobility status PT Visit Diagnosis: Other abnormalities of gait and mobility (R26.89);Difficulty in walking, not elsewhere classified (R26.2)    Time: 1610-9604 PT Time Calculation (min) (ACUTE ONLY): 22 min   Charges:   PT Evaluation $PT Eval Low Complexity: 1 Low          Falecia Vannatter P, PT Acute Rehabilitation Services Office: [phone]   Enedina Finner Aeralyn Barna 10/11/2022, 1:22 PM

## 2022-10-11 NOTE — Progress Notes (Signed)
[facility] NEPHROLOGY PROGRESS NOTE  Assessment/ Plan: Pt is a 89 y.o. yo male  HTN, HFpEF, afib, pacer for tachybrady syndrome, CVA, PAD, DM, CKDIV p/w sob.  # CKD IV b/l cr 1.9-2.4: Due to CHF, pericardial effusion and hemodynamically mediated in the setting of hypertension.  Clinically much better after pericardiocentesis.  He has been receiving IV Lasix.  Now the creatinine level seems to be around baseline and clinically stable.  I will switch Lasix to oral.  Checking renal panel today.  I agree that he is not a long-term dialysis candidate given his age, functional status and multiple comorbidities.  # Pericardial effusion, large status post pericardiocentesis on 5/12.  Clinically much better now.  Cardiology is following.  # Anemia of CKD and blood loss: Hemoglobin at goal.  Continue monitor.  # Hypotension: Noted he is on a small dose of midodrine, I will increase to 10 mg twice a day.  Requiring metoprolol for A-fib.  # A-fib with history of tachybradycardia syndrome.  On metoprolol, in cardiac ICU. Discussed with ICU nurse.  Subjective: Seen and examined at bedside.  Patient said he reports much better.  Denies nausea, vomiting, chest pain, shortness of breath.  Urine output is recorded around 1000 cc.  Renal panel is pending from today. Objective Vital signs in last 24 hours: Vitals:   10/11/22 0800 10/11/22 0803 10/11/22 0805 10/11/22 0815  BP: (!) 75/55 (!) 80/55 (!) 80/60 (!) 81/51  Pulse: 98 (!) 101 (!) 107 96  Resp: (!) 21 15 18 12   Temp:      TempSrc:      SpO2: 100% 99% 100% 99%  Weight:      Height:       Weight change: -1.9 kg  Intake/Output Summary (Last 24 hours) at 10/11/2022 0825 Last data filed at 10/11/2022 0700 Gross per 24 hour  Intake 150 ml  Output 1007 ml  Net -857 ml       Labs: RENAL PANEL Recent Labs  Lab 10/07/22 1110 10/08/22 0112 10/09/22 0043 10/10/22 0051  NA 139  142 138 136 136  K 4.4  4.4 4.5 4.4 4.7  CL  104  103 103 103 102  CO2 25 23 24 24   GLUCOSE 177*  175* 136* 129* 161*  BUN 27*  28* 31* 30* 30*  CREATININE 2.30*  2.30* 2.25* 2.30* 2.26*  CALCIUM 8.0* 8.3* 8.0* 8.1*  MG 1.8  --   --  1.9  ALBUMIN 2.9*  --  2.6*  --     Liver Function Tests: Recent Labs  Lab 10/07/22 1110 10/09/22 0043  AST 15 14*  ALT 9 8  ALKPHOS 68 66  BILITOT 0.4 0.8  PROT 6.2* 5.6*  ALBUMIN 2.9* 2.6*   No results for input(s): "LIPASE", "AMYLASE" in the last 168 hours. No results for input(s): "AMMONIA" in the last 168 hours. CBC: Recent Labs    11/30/21 0939 12/05/21 1349 01/01/22 0335 01/19/22 2028 05/30/22 1446 07/06/22 1211 10/08/22 0112 10/09/22 0043 10/09/22 0703 10/10/22 0051 10/11/22 0141  HGB 9.5*   < > 8.0*   < > 9.5*   < > 10.2* 9.9* 10.1* 11.6* 11.0*  MCV 82.1   < > 80.3   < > 78.9   < > 76.3* 76.3* 76.3* 76.2* 74.9*  FERRITIN 94  --  56  --  56  --   --   --   --   --   --   TIBC 203*  --  230*  --  246*  --   --   --   --   --   --   IRON 77  --  41*  --  48*  --   --   --   --   --   --    < > = values in this interval not displayed.    Cardiac Enzymes: No results for input(s): "CKTOTAL", "CKMB", "CKMBINDEX", "TROPONINI" in the last 168 hours. CBG: Recent Labs  Lab 10/10/22 0743 10/10/22 1138 10/10/22 1602 10/10/22 2156 10/11/22 0651  GLUCAP 120* 144* 147* 122* 124*    Iron Studies: No results for input(s): "IRON", "TIBC", "TRANSFERRIN", "FERRITIN" in the last 72 hours. Studies/Results: ECHOCARDIOGRAM LIMITED  Result Date: 10/10/2022    ECHOCARDIOGRAM LIMITED REPORT   Patient Name:   [name] Date of Exam: 10/10/2022 Medical Rec #:  [id number]             Height:       71.0 in Accession #:    [id number]            Weight:       139.6 lb Date of Birth:  November 12, 1923            BSA:          1.810 m Patient Age:    89 years              BP:           84/67 mmHg Patient Gender: M                     HR:           83 bpm. Exam Location:  Inpatient  Procedure: Limited Echo Indications:    Pericardial Effusion, recheck with pericardial drain in place.  History:        Patient has prior history of Echocardiogram examinations, most                 recent 10/09/2022.  Sonographer:    Irving Burton Senior RDCS Referring Phys: Thomasene Ripple  Sonographer Comments: Technically difficult due to thin body habitus. IMPRESSIONS  1. Technically difficult study with limited visualization of cardiac structures.  2. The LV is not well visualized, however, on limited views appears to have mild-moderate LVH with mildly reduced systolic function (EF 45%-50%).  3. Right ventricular systolic function was not well visualized, however, appears normal.  4. There is a fibrinous and localized pericardial effusion measuring 1.53cm adjacent to the RA/RV. The IVC now measures 1.53 cm. There are no obvious signs of tamponade. Compared to prior echocardiogram (10/09/22) there has been significant decrease in the effusion. FINDINGS  Left Ventricle: Left ventricular endocardial border not optimally defined to evaluate regional wall motion. Right Ventricle: Right ventricular systolic function was not well visualized. Left Atrium: Left atrial size was not well visualized. Pericardium: Fibrinous pericardial effusion measuring up to 1.53cm. A moderately sized pericardial effusion is present. The pericardial effusion appears to contain fibrous material. Mitral Valve: The mitral valve was not assessed. Tricuspid Valve: The tricuspid valve is not assessed. Aortic Valve: The aortic valve was not assessed. Pulmonic Valve: The pulmonic valve was not assessed. Aorta: Aortic root could not be assessed. Venous: The inferior vena cava is normal in size with greater than 50% respiratory variability, suggesting right atrial pressure of 3 mmHg. Additional Comments: A device lead is visualized.  Dorthula Nettles Electronically signed by Dorthula Nettles Signature Date/Time:  10/10/2022/3:27:36 PM    Final    ECHOCARDIOGRAM  LIMITED  Result Date: 10/09/2022    ECHOCARDIOGRAM LIMITED REPORT   Patient Name:   [name] [facility] Date of Exam: 10/09/2022 Medical Rec #:  [id number]             Height:       71.0 in Accession #:    [id number]            Weight:       145.7 lb Date of Birth:  [birth year]            BSA:          1.843 m Patient Age:    89 years              BP:           111/81 mmHg Patient Gender: M                     HR:           80 bpm. Exam Location:  Inpatient Procedure: Limited Echo and Echo Guidance/Pericardial Tap Indications:    I31.3 Pericardial effusion  History:        Patient has prior history of Echocardiogram examinations, most                 recent 10/07/2022. CHF, Arrythmias:Atrial Fibrillation; Risk                 Factors:Hypertension and Diabetes.  Sonographer:    Irving Burton Senior RDCS Referring Phys: [id number] Corky Crafts  Sonographer Comments: Pericardiocentesis IMPRESSIONS  1. 2.5 cm pericardial effusion with RV collapse. Catheter noted in pericardial space, apical. Post tap, trivial pericardial effusion with normal RV function. Pleural effusion noted. Large pericardial effusion. The pericardial effusion is circumferential. FINDINGS  Pericardium: 2.5 cm pericardial effusion with RV collapse. Catheter noted in pericardial space, apical. Post tap, trivial pericardial effusion with normal RV function. Pleural effusion noted. A large pericardial effusion is present. The pericardial effusion is circumferential. Donato Schultz MD Electronically signed by Donato Schultz MD Signature Date/Time: 10/09/2022/11:54:56 AM    Final    CARDIAC CATHETERIZATION  Result Date: 10/09/2022   Successful pericardiocentesis with 1030 cc fluid removal.  Fluid was serosanguinous and somewhat dark. Will leave drain in place overnight.  Would hopefully be able to remove tomorrow based on drainage.  Results conveyed to granddaughter.    Medications: Infusions:  sodium chloride Stopped (10/09/22 0955)   sodium chloride       Scheduled Medications:  Chlorhexidine Gluconate Cloth  6 each Topical Daily   divalproex  125 mg Oral BID   docusate sodium  100 mg Oral Daily   feeding supplement  237 mL Oral QHS   finasteride  5 mg Oral Daily   furosemide  40 mg Intravenous Daily   heparin  5,000 Units Subcutaneous Q8H   insulin aspart  0-5 Units Subcutaneous QHS   insulin aspart  0-6 Units Subcutaneous TID WC   latanoprost  1 drop Both Eyes QHS   melatonin  3 mg Oral QHS   memantine  10 mg Oral BID   metoprolol tartrate  25 mg Oral BID   midodrine  2.5 mg Oral TID WC   pantoprazole  40 mg Oral Daily   sodium chloride flush  3 mL Intravenous Q12H   sodium chloride flush  3 mL Intravenous Q12H   tamsulosin  0.4 mg Oral Daily  have reviewed scheduled and prn medications.  Physical Exam: General:NAD, comfortable Heart:RRR, s1s2 nl Lungs:clear b/l, no crackle Abdomen:soft, Non-tender, non-distended Extremities:No edema Neurology: Alert awake and following commands.  Ladaysha Soutar Prasad Jazzmin Newbold 10/11/2022,8:25 AM  LOS: 4 days

## 2022-10-12 DIAGNOSIS — I3139 Other pericardial effusion (noninflammatory): Secondary | ICD-10-CM | POA: Diagnosis not present

## 2022-10-12 DIAGNOSIS — I5033 Acute on chronic diastolic (congestive) heart failure: Secondary | ICD-10-CM | POA: Diagnosis not present

## 2022-10-12 LAB — CBC
HCT: 32 % — ABNORMAL LOW (ref 39.0–52.0)
Hemoglobin: 10.8 g/dL — ABNORMAL LOW (ref 13.0–17.0)
MCH: 25 pg — ABNORMAL LOW (ref 26.0–34.0)
MCHC: 33.8 g/dL (ref 30.0–36.0)
MCV: 74.1 fL — ABNORMAL LOW (ref 80.0–100.0)
Platelets: 148 10*3/uL — ABNORMAL LOW (ref 150–400)
RBC: 4.32 MIL/uL (ref 4.22–5.81)
RDW: 17.3 % — ABNORMAL HIGH (ref 11.5–15.5)
WBC: 9 10*3/uL (ref 4.0–10.5)
nRBC: 0 % (ref 0.0–0.2)

## 2022-10-12 LAB — GLUCOSE, CAPILLARY
Glucose-Capillary: 117 mg/dL — ABNORMAL HIGH (ref 70–99)
Glucose-Capillary: 118 mg/dL — ABNORMAL HIGH (ref 70–99)
Glucose-Capillary: 138 mg/dL — ABNORMAL HIGH (ref 70–99)
Glucose-Capillary: 178 mg/dL — ABNORMAL HIGH (ref 70–99)

## 2022-10-12 LAB — RENAL FUNCTION PANEL
Albumin: 1.7 g/dL — ABNORMAL LOW (ref 3.5–5.0)
Anion gap: 10 (ref 5–15)
BUN: 35 mg/dL — ABNORMAL HIGH (ref 8–23)
CO2: 22 mmol/L (ref 22–32)
Calcium: 7.6 mg/dL — ABNORMAL LOW (ref 8.9–10.3)
Chloride: 104 mmol/L (ref 98–111)
Creatinine, Ser: 1.98 mg/dL — ABNORMAL HIGH (ref 0.61–1.24)
GFR, Estimated: 30 mL/min — ABNORMAL LOW (ref >60)
Glucose, Bld: 115 mg/dL — ABNORMAL HIGH (ref 70–99)
Phosphorus: 3.4 mg/dL (ref 2.5–4.6)
Potassium: 3.7 mmol/L (ref 3.5–5.1)
Sodium: 136 mmol/L (ref 135–145)

## 2022-10-12 MED ORDER — FUROSEMIDE 40 MG PO TABS
40.0000 mg | ORAL_TABLET | Freq: Every day | ORAL | Status: DC
  Administered 2022-10-12 – 2022-10-15 (×4): 40 mg via ORAL
  Filled 2022-10-12 (×4): qty 1

## 2022-10-12 MED ORDER — MIDODRINE HCL 5 MG PO TABS
10.0000 mg | ORAL_TABLET | Freq: Three times a day (TID) | ORAL | Status: DC
  Administered 2022-10-12 – 2022-10-13 (×3): 10 mg via ORAL
  Filled 2022-10-12 (×3): qty 2

## 2022-10-12 NOTE — Plan of Care (Signed)
  Problem: Education: Goal: Ability to demonstrate management of disease process will improve Outcome: Progressing Goal: Ability to verbalize understanding of medication therapies will improve Outcome: Progressing   Problem: Activity: Goal: Capacity to carry out activities will improve Outcome: Progressing   Problem: Cardiac: Goal: Ability to achieve and maintain adequate cardiopulmonary perfusion will improve Outcome: Progressing   Problem: Education: Goal: Individualized Educational Video(s) Outcome: Progressing   Problem: Coping: Goal: Ability to adjust to condition or change in health will improve Outcome: Progressing   Problem: Fluid Volume: Goal: Ability to maintain a balanced intake and output will improve Outcome: Progressing   Problem: Health Behavior/Discharge Planning: Goal: Ability to identify and utilize available resources and services will improve Outcome: Progressing Goal: Ability to manage health-related needs will improve Outcome: Progressing   Problem: Metabolic: Goal: Ability to maintain appropriate glucose levels will improve Outcome: Progressing   Problem: Nutritional: Goal: Maintenance of adequate nutrition will improve Outcome: Progressing Goal: Progress toward achieving an optimal weight will improve Outcome: Progressing   Problem: Skin Integrity: Goal: Risk for impaired skin integrity will decrease Outcome: Progressing   Problem: Tissue Perfusion: Goal: Adequacy of tissue perfusion will improve Outcome: Progressing   Problem: Health Behavior/Discharge Planning: Goal: Ability to manage health-related needs will improve Outcome: Progressing   Problem: Clinical Measurements: Goal: Ability to maintain clinical measurements within normal limits will improve Outcome: Progressing Goal: Will remain free from infection Outcome: Progressing Goal: Diagnostic test results will improve Outcome: Progressing Goal: Respiratory complications will  improve Outcome: Progressing   Problem: Nutrition: Goal: Adequate nutrition will be maintained Outcome: Progressing   Problem: Coping: Goal: Level of anxiety will decrease Outcome: Progressing   Problem: Elimination: Goal: Will not experience complications related to bowel motility Outcome: Progressing

## 2022-10-12 NOTE — Progress Notes (Signed)
PROGRESS NOTE    [name]  WUJ:[id number] DOB: [birth year] DOA: 10/07/2022 PCP: Mliss Sax, MD   Brief Narrative:  89 y.o. male with medical history significant of hypertension, heart failure with preserved EF, atrial fibrillation, tachybradycardia s/p pacemaker, CVA, peripheral arterial disease, CKD stage IV, diabetes mellitus type 2 who presents with complaints of shortness of breath.   In the emergency department patient was noted to be afebrile with respirations 12-23, and all other vital signs maintained. Lungs with no wheezing or rhonchi, heart with S1 and S2 present irregularly irregular with no gallops or murmur, abdomen with no distention and positive lower extremity edema. Left foot great toes with necrotic tissue.    Labs significant for WBC 7, hemoglobin 9.6, BUN 28, creatinine 2.3, ionized calcium 1.11, and BNP 352.     Chest radiograph with cardiomegaly, bilateral hilar vascular congestion, bilateral pleural effusions more left than right. Pacemaker in place with one lead in the right atrium and one in the right ventricle.    Patient has been given IV furosemide.Cardiology had been formally consulted.   Echocardiogram with large pericardial effusion with signs of tamponade. 05/22 pericardiocentesis 1 L removed and catheter left in place.  05/23 continue pericardiocentesis catheter in place.  05/24 pericardiocentesis drain has been,   Assessment & Plan:   Acute on chronic diastolic CHF (congestive heart failure) ([name]) -Echocardiogram with preserved LV systolic function with EF 50 to 55%, severe LVH, RVSP 38.9. LA with severe dilatation. Large pericardial effusion.  -Continue Lasix.  Continue strict INO's and daily weight.   Pericardial effusion  -S/p pericardiocentesis on 5/22- 1030 cc fluid removed, serosanguinous and somewhat dark.  -Follow-up echocardiogram showed much improved effusion.  Cardiology recommended repeat echocardiogram in 4 to 6  weeks. -Catheter has been removed on 10/11/2022 -Can be transferred to telemetry from cardiac standpoint    CKD (chronic kidney disease), stage IV ([name]) -Evaluated by nephrology.  Creatinine is improving and around his baseline.  Nephrology signed off.   Anemia of chronic renal disease.  -Stable hgb.  Continue to monitor   Atrial fibrillation ([name]) -Persistent atrial fibrillation.  -Patient has diagnosis of tachy brady syndrome and had a pacemaker in place.  -Continue Eliquis.  Metoprolol on hold due to low BP   Hypotension -Continue midodrine for blood pressure support.  Hold metoprolol   Type 2 diabetes mellitus without complication, without long-term current use of insulin ([name]) -Continue glucose cover and monitoring with insulin sliding scale.  -A1c 7.2%.   Alzheimer disease ([name]) -No agitation, continue with memantine and depakote.    History of CVA (cerebrovascular accident) -At baseline   Benign prostatic hyperplasia with urinary obstruction -Continue to monitor urine output.  -On proscar and flomax.    Gangrene of toe of left foot ([name]) Peripheral vascular disease: -Followed by podiatry outpatient.  After ABI of left leg were noted be abnormal and was sent to Dr. Kirke Corin.  Surgical intervention was recommended but family was waiting on clearance from his other providers.  He had similar surgery in 2018. -Recommend to follow-up outpatient    DVT prophylaxis: Eliquis Code Status: DNR  Family Communication: None present at bedside.  Plan of care discussed with patient in length and he verbalized understanding and agreed with it. Disposition Plan: to be determined  Consultants:  [facility]  Procedures:  pericardiocentensis  Antimicrobials:  none  Status is: Inpatient    Subjective:   Objective: Vitals:   10/12/22 1030 10/12/22 1100 10/12/22 1110 10/12/22 1155  BP: 95/68  119/79   Pulse: (!) 107  (!) 110   Resp: (!) 22 (!) 24 (!) 23   Temp:     (!) 97.4 F (36.3 C)  TempSrc:    Oral  SpO2: 99%  100%   Weight:      Height:        Intake/Output Summary (Last 24 hours) at 10/12/2022 1210 Last data filed at 10/12/2022 0600 Gross per 24 hour  Intake 792.87 ml  Output 300 ml  Net 492.87 ml   Filed Weights   10/09/22 0413 10/10/22 0500 10/11/22 0500  Weight: 66.1 kg 63.3 kg 61.4 kg    Examination:  General exam: Appears calm and comfortable, on RA, communicating well Respiratory system: Clear to auscultation. Respiratory effort normal. Cardiovascular system: Irregular heart rhythm, no gallops or rubs.  No leg edema gastrointestinal system: Abdomen is nondistended, soft and nontender. No organomegaly or masses felt. Normal bowel sounds heard. Central nervous system: Alert and oriented. No focal neurological deficits. Extremities: Symmetric 5 x 5 power. Skin: No rashes, lesions or ulcers Psychiatry: Judgement and insight appear normal. Mood & affect appropriate.    Data Reviewed: I have personally reviewed following labs and imaging studies  CBC: Recent Labs  Lab 10/07/22 1110 10/08/22 0112 10/09/22 0043 10/09/22 0703 10/10/22 0051 10/11/22 0141 10/12/22 0433  WBC 7.0   < > 7.1 6.5 8.1 9.4 9.0  NEUTROABS 5.3  --   --   --   --   --   --   HGB 9.6*  11.2*   < > 9.9* 10.1* 11.6* 11.0* 10.8*  HCT 29.8*  33.0*   < > 30.0* 30.3* 35.3* 33.2* 32.0*  MCV 79.0*   < > 76.3* 76.3* 76.2* 74.9* 74.1*  PLT 162   < > 169 167 170 166 148*   < > = values in this interval not displayed.   Basic Metabolic Panel: Recent Labs  Lab 10/07/22 1110 10/08/22 0112 10/09/22 0043 10/10/22 0051 10/11/22 0141 10/12/22 0433  NA 139  142 138 136 136 136 136  K 4.4  4.4 4.5 4.4 4.7 4.4 3.7  CL 104  103 103 103 102 99 104  CO2 25 23 24 24 24 22   GLUCOSE 177*  175* 136* 129* 161* 194* 115*  BUN 27*  28* 31* 30* 30* 36* 35*  CREATININE 2.30*  2.30* 2.25* 2.30* 2.26* 2.35* 1.98*  CALCIUM 8.0* 8.3* 8.0* 8.1* 7.9* 7.6*  MG 1.8   --   --  1.9  --   --   PHOS  --   --   --   --  3.6 3.4   GFR: Estimated Creatinine Clearance: 19 mL/min (A) (by C-G formula based on SCr of 1.98 mg/dL (H)). Liver Function Tests: Recent Labs  Lab 10/07/22 1110 10/09/22 0043 10/11/22 0141 10/12/22 0433  AST 15 14*  --   --   ALT 9 8  --   --   ALKPHOS 68 66  --   --   BILITOT 0.4 0.8  --   --   PROT 6.2* 5.6*  --   --   ALBUMIN 2.9* 2.6* 2.1* 1.7*   No results for input(s): "LIPASE", "AMYLASE" in the last 168 hours. No results for input(s): "AMMONIA" in the last 168 hours. Coagulation Profile: No results for input(s): "INR", "PROTIME" in the last 168 hours. Cardiac Enzymes: No results for input(s): "CKTOTAL", "CKMB", "CKMBINDEX", "TROPONINI" in the last 168 hours. BNP (last 3 results)  No results for input(s): "PROBNP" in the last 8760 hours. HbA1C: No results for input(s): "HGBA1C" in the last 72 hours. CBG: Recent Labs  Lab 10/11/22 1120 10/11/22 1605 10/11/22 2109 10/12/22 0625 10/12/22 1158  GLUCAP 198* 122* 150* 117* 118*   Lipid Profile: No results for input(s): "CHOL", "HDL", "LDLCALC", "TRIG", "CHOLHDL", "LDLDIRECT" in the last 72 hours. Thyroid Function Tests: No results for input(s): "TSH", "T4TOTAL", "FREET4", "T3FREE", "THYROIDAB" in the last 72 hours. Anemia Panel: No results for input(s): "VITAMINB12", "FOLATE", "FERRITIN", "TIBC", "IRON", "RETICCTPCT" in the last 72 hours. Sepsis Labs: No results for input(s): "PROCALCITON", "LATICACIDVEN" in the last 168 hours.  Recent Results (from the past 240 hour(s))  Resp panel by RT-PCR (RSV, Flu A&B, Covid) Anterior Nasal Swab     Status: None   Collection Time: 10/07/22 10:38 AM   Specimen: Anterior Nasal Swab  Result Value Ref Range Status   SARS Coronavirus 2 by RT PCR NEGATIVE NEGATIVE Final   Influenza A by PCR NEGATIVE NEGATIVE Final   Influenza B by PCR NEGATIVE NEGATIVE Final    Comment: (NOTE) The Xpert Xpress SARS-CoV-2/FLU/RSV plus assay is  intended as an aid in the diagnosis of influenza from Nasopharyngeal swab specimens and should not be used as a sole basis for treatment. Nasal washings and aspirates are unacceptable for Xpert Xpress SARS-CoV-2/FLU/RSV testing.  Fact Sheet for Patients: BloggerCourse.com  Fact Sheet for Healthcare Providers: SeriousBroker.it  This test is not yet approved or cleared by the [place] FDA and has been authorized for detection and/or diagnosis of SARS-CoV-2 by FDA under an Emergency Use Authorization (EUA). This EUA will remain in effect (meaning this test can be used) for the duration of the COVID-19 declaration under Section 564(b)(1) of the Act, 21 U.S.C. section 360bbb-3(b)(1), unless the authorization is terminated or revoked.     Resp Syncytial Virus by PCR NEGATIVE NEGATIVE Final    Comment: (NOTE) Fact Sheet for Patients: BloggerCourse.com  Fact Sheet for Healthcare Providers: SeriousBroker.it  This test is not yet approved or cleared by the [place] FDA and has been authorized for detection and/or diagnosis of SARS-CoV-2 by FDA under an Emergency Use Authorization (EUA). This EUA will remain in effect (meaning this test can be used) for the duration of the COVID-19 declaration under Section 564(b)(1) of the Act, 21 U.S.C. section 360bbb-3(b)(1), unless the authorization is terminated or revoked.  Performed at [facility], [address] [place], [place] [address]   Culture, body fluid w Gram Stain-bottle     Status: None (Preliminary result)   Collection Time: 10/09/22 10:22 AM   Specimen: Path fluid  Result Value Ref Range Status   Specimen Description FLUID  Final   Special Requests NONE  Final   Culture   Final    NO GROWTH 3 DAYS Performed at [facility], [address] [place], [place] [address]    Report Status PENDING  Incomplete   Gram stain     Status: None   Collection Time: 10/09/22 10:22 AM   Specimen: Path fluid  Result Value Ref Range Status   Specimen Description FLUID  Final   Special Requests NONE  Final   Gram Stain   Final    FEW WBC PRESENT,BOTH PMN AND MONONUCLEAR NO ORGANISMS SEEN Performed at [facility], [address], [place], [place] [address]    Report Status 10/09/2022 FINAL  Final      Radiology Studies: ECHOCARDIOGRAM LIMITED  Result Date:  10/10/2022    ECHOCARDIOGRAM LIMITED REPORT   Patient Name:   Per Notaro Date of Exam: 10/10/2022 Medical Rec #:  [id number]             Height:       71.0 in Accession #:    [id number]            Weight:       139.6 lb Date of Birth:  Jan 02, 1924            BSA:          1.810 m Patient Age:    89 years              BP:           84/67 mmHg Patient Gender: M                     HR:           83 bpm. Exam Location:  Inpatient Procedure: Limited Echo Indications:    Pericardial Effusion, recheck with pericardial drain in place.  History:        Patient has prior history of Echocardiogram examinations, most                 recent 10/09/2022.  Sonographer:    Irving Burton Senior RDCS Referring Phys: Thomasene Ripple  Sonographer Comments: Technically difficult due to thin body habitus. IMPRESSIONS  1. Technically difficult study with limited visualization of cardiac structures.  2. The LV is not well visualized, however, on limited views appears to have mild-moderate LVH with mildly reduced systolic function (EF 45%-50%).  3. Right ventricular systolic function was not well visualized, however, appears normal.  4. There is a fibrinous and localized pericardial effusion measuring 1.53cm adjacent to the RA/RV. The IVC now measures 1.53 cm. There are no obvious signs of tamponade. Compared to prior echocardiogram (10/09/22) there has been significant decrease in the effusion. FINDINGS  Left Ventricle: Left ventricular endocardial border not optimally defined to  evaluate regional wall motion. Right Ventricle: Right ventricular systolic function was not well visualized. Left Atrium: Left atrial size was not well visualized. Pericardium: Fibrinous pericardial effusion measuring up to 1.53cm. A moderately sized pericardial effusion is present. The pericardial effusion appears to contain fibrous material. Mitral Valve: The mitral valve was not assessed. Tricuspid Valve: The tricuspid valve is not assessed. Aortic Valve: The aortic valve was not assessed. Pulmonic Valve: The pulmonic valve was not assessed. Aorta: Aortic root could not be assessed. Venous: The inferior vena cava is normal in size with greater than 50% respiratory variability, suggesting right atrial pressure of 3 mmHg. Additional Comments: A device lead is visualized.  Aditya Sabharwal Electronically signed by Dorthula Nettles Signature Date/Time: 10/10/2022/3:27:36 PM    Final     Scheduled Meds:  apixaban  2.5 mg Oral BID   Chlorhexidine Gluconate Cloth  6 each Topical Daily   divalproex  125 mg Oral BID   docusate sodium  100 mg Oral Daily   feeding supplement  237 mL Oral QHS   finasteride  5 mg Oral Daily   furosemide  40 mg Oral Daily   insulin aspart  0-5 Units Subcutaneous QHS   insulin aspart  0-6 Units Subcutaneous TID WC   latanoprost  1 drop Both Eyes QHS   melatonin  3 mg Oral QHS   memantine  10 mg Oral BID   metoprolol tartrate  25 mg Oral BID   midodrine  10 mg Oral TID WC   pantoprazole  40 mg Oral Daily   sodium chloride flush  3 mL Intravenous Q12H   sodium chloride flush  3 mL Intravenous Q12H   tamsulosin  0.4 mg Oral Daily   Continuous Infusions:  sodium chloride Stopped (10/09/22 0955)   sodium chloride       LOS: 5 days   Time spent: 35 minutes   Emilyanne Mcgough Estill Cotta, MD [facility]  If 7PM-7AM, please contact night-coverage [email or web address] 10/12/2022, 12:10 PM

## 2022-10-12 NOTE — Progress Notes (Signed)
[facility] NEPHROLOGY PROGRESS NOTE  Assessment/ Plan: Pt is a 89 y.o. yo male  HTN, HFpEF, afib, pacer for tachybrady syndrome, CVA, PAD, DM, CKDIV p/w sob.  # CKD IV b/l cr 1.9-2.4: Due to CHF, pericardial effusion and hemodynamically mediated in the setting of hypertension.  Clinically much better after pericardiocentesis.  He was treated with IV Lasix initially.  The creatinine level is improving and around his baseline.  Switching to oral furosemide.I agree that he is not a long-term dialysis candidate given his age, functional status and multiple comorbidities.  Noted he is DNR/DNI.  # Pericardial effusion, large status post pericardiocentesis on 5/12.  Clinically much better now.  Seen by cardiologist.  # Anemia of CKD and blood loss: Hemoglobin at goal.  Continue monitor.  # Hypotension: Increase midodrine dose to 10 mg 3 times a day.  Monitor BP.  Requiring metoprolol for A-fib.  # A-fib with history of tachybradycardia syndrome.  On metoprolol, in cardiac ICU. Discussed with ICU nurse.  Nothing further to add.  I will sign off.  Please call us back with question.  Subjective: Seen and examined at bedside.  No major health event.  Denies nausea, vomiting, chest pain, shortness of breath.  Labs improving.  Objective Vital signs in last 24 hours: Vitals:   10/12/22 0800 10/12/22 0820 10/12/22 0830 10/12/22 0900  BP: 102/82  95/67 (!) 89/61  Pulse: (!) 108 (!) 132 (!) 118 (!) 110  Resp: (!) 27 (!) 41 (!) 23 (!) 31  Temp:  97.6 F (36.4 C)    TempSrc:  Axillary    SpO2: 100% (!) 84% 95% 99%  Weight:      Height:       Weight change:   Intake/Output Summary (Last 24 hours) at 10/12/2022 0941 Last data filed at 10/12/2022 0600 Gross per 24 hour  Intake 792.87 ml  Output 300 ml  Net 492.87 ml        Labs: RENAL PANEL Recent Labs  Lab 10/07/22 1110 10/08/22 0112 10/09/22 0043 10/10/22 0051 10/11/22 0141 10/12/22 0433  NA 139  142 138 136 136  136 136  K 4.4  4.4 4.5 4.4 4.7 4.4 3.7  CL 104  103 103 103 102 99 104  CO2 25 23 24 24 24 22   GLUCOSE 177*  175* 136* 129* 161* 194* 115*  BUN 27*  28* 31* 30* 30* 36* 35*  CREATININE 2.30*  2.30* 2.25* 2.30* 2.26* 2.35* 1.98*  CALCIUM 8.0* 8.3* 8.0* 8.1* 7.9* 7.6*  MG 1.8  --   --  1.9  --   --   PHOS  --   --   --   --  3.6 3.4  ALBUMIN 2.9*  --  2.6*  --  2.1* 1.7*     Liver Function Tests: Recent Labs  Lab 10/07/22 1110 10/09/22 0043 10/11/22 0141 10/12/22 0433  AST 15 14*  --   --   ALT 9 8  --   --   ALKPHOS 68 66  --   --   BILITOT 0.4 0.8  --   --   PROT 6.2* 5.6*  --   --   ALBUMIN 2.9* 2.6* 2.1* 1.7*    No results for input(s): "LIPASE", "AMYLASE" in the last 168 hours. No results for input(s): "AMMONIA" in the last 168 hours. CBC: Recent Labs    11/30/21 1610 12/05/21 1349 01/01/22 0335 01/19/22 2028 05/30/22 1446 07/06/22 1211 10/09/22 9604 10/09/22 0703 10/10/22 5409  10/11/22 0141 10/12/22 0433  HGB 9.5*   < > 8.0*   < > 9.5*   < > 9.9* 10.1* 11.6* 11.0* 10.8*  MCV 82.1   < > 80.3   < > 78.9   < > 76.3* 76.3* 76.2* 74.9* 74.1*  FERRITIN 94  --  56  --  56  --   --   --   --   --   --   TIBC 203*  --  230*  --  246*  --   --   --   --   --   --   IRON 77  --  41*  --  48*  --   --   --   --   --   --    < > = values in this interval not displayed.     Cardiac Enzymes: No results for input(s): "CKTOTAL", "CKMB", "CKMBINDEX", "TROPONINI" in the last 168 hours. CBG: Recent Labs  Lab 10/11/22 0651 10/11/22 1120 10/11/22 1605 10/11/22 2109 10/12/22 0625  GLUCAP 124* 198* 122* 150* 117*     Iron Studies: No results for input(s): "IRON", "TIBC", "TRANSFERRIN", "FERRITIN" in the last 72 hours. Studies/Results: ECHOCARDIOGRAM LIMITED  Result Date: 10/10/2022    ECHOCARDIOGRAM LIMITED REPORT   Patient Name:   [name] Date of Exam: 10/10/2022 Medical Rec #:  [id number]             Height:       71.0 in Accession #:     [id number]            Weight:       139.6 lb Date of Birth:  Mar 11, 1924            BSA:          1.810 m Patient Age:    89 years              BP:           84/67 mmHg Patient Gender: M                     HR:           83 bpm. Exam Location:  Inpatient Procedure: Limited Echo Indications:    Pericardial Effusion, recheck with pericardial drain in place.  History:        Patient has prior history of Echocardiogram examinations, most                 recent 10/09/2022.  Sonographer:    Irving Burton Senior RDCS Referring Phys: Thomasene Ripple  Sonographer Comments: Technically difficult due to thin body habitus. IMPRESSIONS  1. Technically difficult study with limited visualization of cardiac structures.  2. The LV is not well visualized, however, on limited views appears to have mild-moderate LVH with mildly reduced systolic function (EF 45%-50%).  3. Right ventricular systolic function was not well visualized, however, appears normal.  4. There is a fibrinous and localized pericardial effusion measuring 1.53cm adjacent to the RA/RV. The IVC now measures 1.53 cm. There are no obvious signs of tamponade. Compared to prior echocardiogram (10/09/22) there has been significant decrease in the effusion. FINDINGS  Left Ventricle: Left ventricular endocardial border not optimally defined to evaluate regional wall motion. Right Ventricle: Right ventricular systolic function was not well visualized. Left Atrium: Left atrial size was not well visualized. Pericardium: Fibrinous pericardial effusion measuring up to 1.53cm. A moderately sized pericardial effusion is present. The  pericardial effusion appears to contain fibrous material. Mitral Valve: The mitral valve was not assessed. Tricuspid Valve: The tricuspid valve is not assessed. Aortic Valve: The aortic valve was not assessed. Pulmonic Valve: The pulmonic valve was not assessed. Aorta: Aortic root could not be assessed. Venous: The inferior vena cava is normal in size with greater  than 50% respiratory variability, suggesting right atrial pressure of 3 mmHg. Additional Comments: A device lead is visualized.  Aditya Sabharwal Electronically signed by Dorthula Nettles Signature Date/Time: 10/10/2022/3:27:36 PM    Final     Medications: Infusions:  sodium chloride Stopped (10/09/22 0955)   sodium chloride      Scheduled Medications:  apixaban  2.5 mg Oral BID   Chlorhexidine Gluconate Cloth  6 each Topical Daily   divalproex  125 mg Oral BID   docusate sodium  100 mg Oral Daily   feeding supplement  237 mL Oral QHS   finasteride  5 mg Oral Daily   insulin aspart  0-5 Units Subcutaneous QHS   insulin aspart  0-6 Units Subcutaneous TID WC   latanoprost  1 drop Both Eyes QHS   melatonin  3 mg Oral QHS   memantine  10 mg Oral BID   metoprolol tartrate  25 mg Oral BID   midodrine  10 mg Oral BID WC   pantoprazole  40 mg Oral Daily   sodium chloride flush  3 mL Intravenous Q12H   sodium chloride flush  3 mL Intravenous Q12H   tamsulosin  0.4 mg Oral Daily    have reviewed scheduled and prn medications.  Physical Exam: General:NAD, comfortable, able to lie flat Heart:RRR, s1s2 nl Lungs: Clear b/l, no crackle Abdomen:soft, Non-tender, non-distended Extremities:No edema Neurology: Alert awake and following commands.  Sandro Burgo Prasad Miria Cappelli 10/12/2022,9:41 AM  LOS: 5 days

## 2022-10-12 NOTE — Progress Notes (Signed)
PT Cancellation Note  Patient Details Name: [name] MRN: [id number] DOB: 09-17-1923   Cancelled Treatment:    Reason Eval/Treat Not Completed: PT screened, no needs identified, will sign off (pt seen and walked in presence of caregiver with D/C of P.T. services 5/24. New order 5/25 with significant change in pt status will not reevaluate at this time. Please refer to 5/24 eval. Ambulation with staff and RW recommended)   [name] [name] 10/12/2022, 12:59 PM [name], PT Acute Rehabilitation Services Office: [phone]

## 2022-10-12 NOTE — Progress Notes (Signed)
Approximately 1500-- Pt arrived to room [facility] from [facility] ICU. Upon arrival, pt ambulated to the bed with no difficulties. VS obtained and telemetry connected. Pt appears drowsy, but easily arouseable. Full assessment completed by this RN. All fall precautions in place.

## 2022-10-12 NOTE — Progress Notes (Signed)
Rounding Note    Patient Name: [name] Date of Encounter: 10/12/2022  [facility] Cardiologist: Dr Servando Salina  Subjective   Pt denies CP or dyspnea  Inpatient Medications    Scheduled Meds:  apixaban  2.5 mg Oral BID   Chlorhexidine Gluconate Cloth  6 each Topical Daily   divalproex  125 mg Oral BID   docusate sodium  100 mg Oral Daily   feeding supplement  237 mL Oral QHS   finasteride  5 mg Oral Daily   furosemide  40 mg Oral Daily   insulin aspart  0-5 Units Subcutaneous QHS   insulin aspart  0-6 Units Subcutaneous TID WC   latanoprost  1 drop Both Eyes QHS   melatonin  3 mg Oral QHS   memantine  10 mg Oral BID   metoprolol tartrate  25 mg Oral BID   midodrine  10 mg Oral TID WC   pantoprazole  40 mg Oral Daily   sodium chloride flush  3 mL Intravenous Q12H   sodium chloride flush  3 mL Intravenous Q12H   tamsulosin  0.4 mg Oral Daily   Continuous Infusions:  sodium chloride Stopped (10/09/22 0955)   sodium chloride     PRN Meds: sodium chloride, acetaminophen **OR** acetaminophen, albuterol, ondansetron (ZOFRAN) IV, mouth rinse, sodium chloride flush   Vital Signs    Vitals:   10/12/22 0800 10/12/22 0820 10/12/22 0830 10/12/22 0900  BP: 102/82  95/67 (!) 89/61  Pulse: (!) 108 (!) 132 (!) 118 (!) 110  Resp: (!) 27 (!) 41 (!) 23 (!) 31  Temp:  97.6 F (36.4 C)    TempSrc:  Axillary    SpO2: 100% (!) 84% 95% 99%  Weight:      Height:        Intake/Output Summary (Last 24 hours) at 10/12/2022 1000 Last data filed at 10/12/2022 0600 Gross per 24 hour  Intake 792.87 ml  Output 300 ml  Net 492.87 ml      10/11/2022    5:00 AM 10/10/2022    5:00 AM 10/09/2022    4:13 AM  Last 3 Weights  Weight (lbs) 135 lb 5.8 oz 139 lb 8.8 oz 145 lb 11.6 oz  Weight (kg) 61.4 kg 63.3 kg 66.1 kg      Telemetry    Atrial fibrillation with mildly elevated rate - Personally Reviewed   Physical Exam   GEN: No acute distress.   Neck: No  JVD Cardiac: irregular Respiratory: Clear to auscultation bilaterally. GI: Soft, nontender, non-distended  MS: No edema Neuro:  Nonfocal  Psych: Normal affect   Labs    High Sensitivity Troponin:   Recent Labs  Lab 10/07/22 1110 10/07/22 1249  TROPONINIHS 26* 27*     Chemistry Recent Labs  Lab 10/07/22 1110 10/08/22 0112 10/09/22 0043 10/10/22 0051 10/11/22 0141 10/12/22 0433  NA 139  142   < > 136 136 136 136  K 4.4  4.4   < > 4.4 4.7 4.4 3.7  CL 104  103   < > 103 102 99 104  CO2 25   < > 24 24 24 22   GLUCOSE 177*  175*   < > 129* 161* 194* 115*  BUN 27*  28*   < > 30* 30* 36* 35*  CREATININE 2.30*  2.30*   < > 2.30* 2.26* 2.35* 1.98*  CALCIUM 8.0*   < > 8.0* 8.1* 7.9* 7.6*  MG 1.8  --   --  1.9  --   --   PROT 6.2*  --  5.6*  --   --   --   ALBUMIN 2.9*  --  2.6*  --  2.1* 1.7*  AST 15  --  14*  --   --   --   ALT 9  --  8  --   --   --   ALKPHOS 68  --  66  --   --   --   BILITOT 0.4  --  0.8  --   --   --   GFRNONAA 25*   < > 25* 26* 25* 30*  ANIONGAP 10   < > 9 10 13 10    < > = values in this interval not displayed.    Hematology Recent Labs  Lab 10/10/22 0051 10/11/22 0141 10/12/22 0433  WBC 8.1 9.4 9.0  RBC 4.63 4.43 4.32  HGB 11.6* 11.0* 10.8*  HCT 35.3* 33.2* 32.0*  MCV 76.2* 74.9* 74.1*  MCH 25.1* 24.8* 25.0*  MCHC 32.9 33.1 33.8  RDW 17.7* 17.4* 17.3*  PLT 170 166 148*    BNP Recent Labs  Lab 10/07/22 1110  BNP 352.0*     Radiology    ECHOCARDIOGRAM LIMITED  Result Date: 10/10/2022    ECHOCARDIOGRAM LIMITED REPORT   Patient Name:   [name] Date of Exam: 10/10/2022 Medical Rec #:  [id number]             Height:       71.0 in Accession #:    [id number]            Weight:       139.6 lb Date of Birth:  1923-07-21            BSA:          1.810 m Patient Age:    89 years              BP:           84/67 mmHg Patient Gender: M                     HR:           83 bpm. Exam Location:  Inpatient Procedure: Limited Echo  Indications:    Pericardial Effusion, recheck with pericardial drain in place.  History:        Patient has prior history of Echocardiogram examinations, most                 recent 10/09/2022.  Sonographer:    Irving Burton Senior RDCS Referring Phys: Thomasene Ripple  Sonographer Comments: Technically difficult due to thin body habitus. IMPRESSIONS  1. Technically difficult study with limited visualization of cardiac structures.  2. The LV is not well visualized, however, on limited views appears to have mild-moderate LVH with mildly reduced systolic function (EF 45%-50%).  3. Right ventricular systolic function was not well visualized, however, appears normal.  4. There is a fibrinous and localized pericardial effusion measuring 1.53cm adjacent to the RA/RV. The IVC now measures 1.53 cm. There are no obvious signs of tamponade. Compared to prior echocardiogram (10/09/22) there has been significant decrease in the effusion. FINDINGS  Left Ventricle: Left ventricular endocardial border not optimally defined to evaluate regional wall motion. Right Ventricle: Right ventricular systolic function was not well visualized. Left Atrium: Left atrial size was not well visualized. Pericardium: Fibrinous pericardial effusion measuring up to 1.53cm. A moderately sized  pericardial effusion is present. The pericardial effusion appears to contain fibrous material. Mitral Valve: The mitral valve was not assessed. Tricuspid Valve: The tricuspid valve is not assessed. Aortic Valve: The aortic valve was not assessed. Pulmonic Valve: The pulmonic valve was not assessed. Aorta: Aortic root could not be assessed. Venous: The inferior vena cava is normal in size with greater than 50% respiratory variability, suggesting right atrial pressure of 3 mmHg. Additional Comments: A device lead is visualized.  Aditya Sabharwal Electronically signed by Dorthula Nettles Signature Date/Time: 10/10/2022/3:27:36 PM    Final      Patient Profile     89 y.o. male  with past medical history of hypertension, chronic diastolic congestive heart failure, history of pacemaker, atrial fibrillation, prior CVA, chronic stage IV kidney disease, diabetes mellitus admitted with dyspnea.  Echocardiogram showed normal LV function, severe left ventricular hypertrophy, severe left atrial enlargement, large pericardial effusion and trace aortic insufficiency.  Patient underwent pericardiocentesis on May 22.  Follow-up echocardiogram Oct 10, 2022 showed ejection fraction 45 to 50% with localized pericardial effusion adjacent to the right atrium/right ventricle significantly decreased compared to previous.  Assessment & Plan    1 acute on chronic diastolic congestive heart failure-patient appears to be doing well from a volume standpoint following his recent pericardiocentesis.  Will continue Lasix at present dose.  2 pericardial effusion-patient is status post pericardiocentesis and follow-up echocardiogram showed much improved effusion.  Would likely repeat echocardiogram in 4 to 6 weeks.  3 persistent atrial fibrillation-blood pressure is somewhat low.  Will hold metoprolol today and follow heart rate.  4 hypotension-patient has been initiated on midodrine.  Will hold metoprolol today and resume at lower dose tomorrow if blood pressure allows.  5 pacemaker-follow-up electrophysiology.  6 chronic stage IV kidney disease-followed by nephrology.  Patient can be transferred to telemetry from a cardiac standpoint.  For questions or updates, please contact [facility] Please consult [email or web address] for contact info under        Signed, Olga Millers, MD  10/12/2022, 10:00 AM

## 2022-10-13 DIAGNOSIS — I4821 Permanent atrial fibrillation: Secondary | ICD-10-CM

## 2022-10-13 DIAGNOSIS — I3139 Other pericardial effusion (noninflammatory): Secondary | ICD-10-CM | POA: Diagnosis not present

## 2022-10-13 DIAGNOSIS — I5033 Acute on chronic diastolic (congestive) heart failure: Secondary | ICD-10-CM | POA: Diagnosis not present

## 2022-10-13 LAB — CBC
HCT: 33.2 % — ABNORMAL LOW (ref 39.0–52.0)
Hemoglobin: 11 g/dL — ABNORMAL LOW (ref 13.0–17.0)
MCH: 25.2 pg — ABNORMAL LOW (ref 26.0–34.0)
MCHC: 33.1 g/dL (ref 30.0–36.0)
MCV: 76 fL — ABNORMAL LOW (ref 80.0–100.0)
Platelets: 145 10*3/uL — ABNORMAL LOW (ref 150–400)
RBC: 4.37 MIL/uL (ref 4.22–5.81)
RDW: 17.7 % — ABNORMAL HIGH (ref 11.5–15.5)
WBC: 8.9 10*3/uL (ref 4.0–10.5)
nRBC: 0.2 % (ref 0.0–0.2)

## 2022-10-13 LAB — RENAL FUNCTION PANEL
Albumin: 2.1 g/dL — ABNORMAL LOW (ref 3.5–5.0)
Anion gap: 9 (ref 5–15)
BUN: 38 mg/dL — ABNORMAL HIGH (ref 8–23)
CO2: 22 mmol/L (ref 22–32)
Calcium: 7.3 mg/dL — ABNORMAL LOW (ref 8.9–10.3)
Chloride: 104 mmol/L (ref 98–111)
Creatinine, Ser: 2.11 mg/dL — ABNORMAL HIGH (ref 0.61–1.24)
GFR, Estimated: 28 mL/min — ABNORMAL LOW (ref >60)
Glucose, Bld: 191 mg/dL — ABNORMAL HIGH (ref 70–99)
Phosphorus: 3.4 mg/dL (ref 2.5–4.6)
Potassium: 4.1 mmol/L (ref 3.5–5.1)
Sodium: 135 mmol/L (ref 135–145)

## 2022-10-13 LAB — GLUCOSE, CAPILLARY
Glucose-Capillary: 142 mg/dL — ABNORMAL HIGH (ref 70–99)
Glucose-Capillary: 182 mg/dL — ABNORMAL HIGH (ref 70–99)
Glucose-Capillary: 104 mg/dL — ABNORMAL HIGH (ref 70–99)
Glucose-Capillary: 163 mg/dL — ABNORMAL HIGH (ref 70–99)

## 2022-10-13 LAB — CULTURE, BODY FLUID W GRAM STAIN -BOTTLE

## 2022-10-13 LAB — MAGNESIUM: Magnesium: 2.1 mg/dL (ref 1.7–2.4)

## 2022-10-13 MED ORDER — DOCUSATE SODIUM 100 MG PO CAPS
100.0000 mg | ORAL_CAPSULE | Freq: Two times a day (BID) | ORAL | Status: DC
  Administered 2022-10-13 – 2022-10-16 (×6): 100 mg via ORAL
  Filled 2022-10-13 (×6): qty 1

## 2022-10-13 MED ORDER — FLEET ENEMA 7-19 GM/118ML RE ENEM
1.0000 | ENEMA | Freq: Once | RECTAL | Status: AC
  Administered 2022-10-13: 1 via RECTAL
  Filled 2022-10-13: qty 1

## 2022-10-13 MED ORDER — MIDODRINE HCL 5 MG PO TABS
5.0000 mg | ORAL_TABLET | Freq: Three times a day (TID) | ORAL | Status: DC
  Administered 2022-10-13 – 2022-10-15 (×7): 5 mg via ORAL
  Filled 2022-10-13 (×7): qty 1

## 2022-10-13 MED ORDER — POLYETHYLENE GLYCOL 3350 17 G PO PACK
17.0000 g | PACK | Freq: Every day | ORAL | Status: DC
  Administered 2022-10-13 – 2022-10-16 (×4): 17 g via ORAL
  Filled 2022-10-13 (×4): qty 1

## 2022-10-13 MED ORDER — METOPROLOL TARTRATE 12.5 MG HALF TABLET
12.5000 mg | ORAL_TABLET | Freq: Two times a day (BID) | ORAL | Status: DC
  Administered 2022-10-13 – 2022-10-16 (×7): 12.5 mg via ORAL
  Filled 2022-10-13 (×7): qty 1

## 2022-10-13 NOTE — Care Management (Signed)
10/13/2022-Attempted to talk to patient at bedside, unable to obtain information from patient at this time. Phone call attempt made to Granddaughter, Judithann Sauger [phone] to get assessment of patient current living arrangements and to address concerns family had for support at home, voicemail left.

## 2022-10-13 NOTE — Plan of Care (Signed)
Pt got up to weigh on the standing scale this AM and HR went up to the 140s, but back to low 100s once back in the bed. Patient denied pain or discomfort. Heavy 2 person assist. Was awake most of the night but no complaints or concerns. Continue to monitor.   Problem: Education: Goal: Ability to demonstrate management of disease process will improve Outcome: Progressing Goal: Ability to verbalize understanding of medication therapies will improve Outcome: Progressing Goal: Individualized Educational Video(s) Outcome: Progressing   Problem: Activity: Goal: Capacity to carry out activities will improve Outcome: Progressing   Problem: Cardiac: Goal: Ability to achieve and maintain adequate cardiopulmonary perfusion will improve Outcome: Progressing   Problem: Education: Goal: Ability to describe self-care measures that may prevent or decrease complications (Diabetes Survival Skills Education) will improve Outcome: Progressing Goal: Individualized Educational Video(s) Outcome: Progressing   Problem: Coping: Goal: Ability to adjust to condition or change in health will improve Outcome: Progressing   Problem: Fluid Volume: Goal: Ability to maintain a balanced intake and output will improve Outcome: Progressing   Problem: Health Behavior/Discharge Planning: Goal: Ability to identify and utilize available resources and services will improve Outcome: Progressing Goal: Ability to manage health-related needs will improve Outcome: Progressing   Problem: Metabolic: Goal: Ability to maintain appropriate glucose levels will improve Outcome: Progressing   Problem: Nutritional: Goal: Maintenance of adequate nutrition will improve Outcome: Progressing Goal: Progress toward achieving an optimal weight will improve Outcome: Progressing   Problem: Skin Integrity: Goal: Risk for impaired skin integrity will decrease Outcome: Progressing   Problem: Tissue Perfusion: Goal: Adequacy of  tissue perfusion will improve Outcome: Progressing   Problem: Education: Goal: Knowledge of General Education information will improve Description: Including pain rating scale, medication(s)/side effects and non-pharmacologic comfort measures Outcome: Progressing   Problem: Health Behavior/Discharge Planning: Goal: Ability to manage health-related needs will improve Outcome: Progressing   Problem: Clinical Measurements: Goal: Ability to maintain clinical measurements within normal limits will improve Outcome: Progressing Goal: Will remain free from infection Outcome: Progressing Goal: Diagnostic test results will improve Outcome: Progressing Goal: Respiratory complications will improve Outcome: Progressing Goal: Cardiovascular complication will be avoided Outcome: Progressing   Problem: Activity: Goal: Risk for activity intolerance will decrease Outcome: Progressing   Problem: Nutrition: Goal: Adequate nutrition will be maintained Outcome: Progressing   Problem: Coping: Goal: Level of anxiety will decrease Outcome: Progressing   Problem: Elimination: Goal: Will not experience complications related to bowel motility Outcome: Progressing Goal: Will not experience complications related to urinary retention Outcome: Progressing   Problem: Pain Managment: Goal: General experience of comfort will improve Outcome: Progressing   Problem: Safety: Goal: Ability to remain free from injury will improve Outcome: Progressing   Problem: Skin Integrity: Goal: Risk for impaired skin integrity will decrease Outcome: Progressing   Problem: Education: Goal: Understanding of CV disease, CV risk reduction, and recovery process will improve Outcome: Progressing Goal: Individualized Educational Video(s) Outcome: Progressing   Problem: Activity: Goal: Ability to return to baseline activity level will improve Outcome: Progressing   Problem: Cardiovascular: Goal: Ability to  achieve and maintain adequate cardiovascular perfusion will improve Outcome: Progressing Goal: Vascular access site(s) Level 0-1 will be maintained Outcome: Progressing   Problem: Health Behavior/Discharge Planning: Goal: Ability to safely manage health-related needs after discharge will improve Outcome: Progressing

## 2022-10-13 NOTE — Progress Notes (Signed)
   10/13/22 1037  Assess: MEWS Score  Temp 97.6 F (36.4 C)  BP 95/71  MAP (mmHg) 79  Pulse Rate 95  ECG Heart Rate (!) 108  Resp 20  SpO2 96 %  Assess: MEWS Score  MEWS Temp 0  MEWS Systolic 1  MEWS Pulse 1  MEWS RR 0  MEWS LOC 0  MEWS Score 2  MEWS Score Color Yellow  Assess: if the MEWS score is Yellow or Red  Were vital signs taken at a resting state? No  Focused Assessment No change from prior assessment  Does the patient meet 2 or more of the SIRS criteria? No  MEWS guidelines implemented  Yes, yellow  Treat  MEWS Interventions Considered administering scheduled or prn medications/treatments as ordered  Take Vital Signs  Increase Vital Sign Frequency  Yellow: Q2hr x1, continue Q4hrs until patient remains green for 12hrs  Escalate  MEWS: Escalate Yellow: Discuss with charge nurse and consider notifying provider and/or RRT  Notify: Charge Nurse/RN  Name of Charge Nurse/RN Guadalupe Dawn, RN  Provider Notification  Provider Name/Title Ardean Larsen  Date Provider Notified 10/13/22  Time Provider Notified 1107  Method of Notification Page  Notification Reason Other (Comment) (Yellow MEWS)  Provider response No new orders;Other (Comment) (MD aware)  Date of Provider Response 10/13/22  Time of Provider Response 1110  Assess: SIRS CRITERIA  SIRS Temperature  0  SIRS Pulse 1  SIRS Respirations  0  SIRS WBC 0  SIRS Score Sum  1

## 2022-10-13 NOTE — Plan of Care (Signed)
  Problem: Education: Goal: Ability to demonstrate management of disease process will improve Outcome: Progressing Goal: Ability to verbalize understanding of medication therapies will improve Outcome: Progressing   Problem: Activity: Goal: Capacity to carry out activities will improve Outcome: Progressing   Problem: Cardiac: Goal: Ability to achieve and maintain adequate cardiopulmonary perfusion will improve Outcome: Progressing   Problem: Coping: Goal: Ability to adjust to condition or change in health will improve Outcome: Progressing   Problem: Fluid Volume: Goal: Ability to maintain a balanced intake and output will improve Outcome: Progressing   Problem: Health Behavior/Discharge Planning: Goal: Ability to identify and utilize available resources and services will improve Outcome: Progressing   Problem: Tissue Perfusion: Goal: Adequacy of tissue perfusion will improve Outcome: Progressing   Problem: Education: Goal: Knowledge of General Education information will improve Description: Including pain rating scale, medication(s)/side effects and non-pharmacologic comfort measures Outcome: Progressing   Problem: Health Behavior/Discharge Planning: Goal: Ability to manage health-related needs will improve Outcome: Progressing   Problem: Nutrition: Goal: Adequate nutrition will be maintained Outcome: Progressing   Problem: Coping: Goal: Level of anxiety will decrease Outcome: Progressing   Problem: Elimination: Goal: Will not experience complications related to bowel motility Outcome: Progressing   Problem: Pain Managment: Goal: General experience of comfort will improve Outcome: Progressing   Problem: Safety: Goal: Ability to remain free from injury will improve Outcome: Progressing   Problem: Skin Integrity: Goal: Risk for impaired skin integrity will decrease Outcome: Progressing

## 2022-10-13 NOTE — Progress Notes (Signed)
Mobility Specialist Progress Note    10/13/22 1223  Mobility  Activity Ambulated with assistance in room  Level of Assistance +2 (takes two people) (safety)  Assistive Device Front wheel walker  Distance Ambulated (ft) 5 ft  Activity Response Tolerated well  Mobility Referral Yes  $Mobility charge 1 Mobility  Mobility Specialist Start Time (ACUTE ONLY) 1202  Mobility Specialist Stop Time (ACUTE ONLY) 1220  Mobility Specialist Time Calculation (min) (ACUTE ONLY) 18 min   Pre-Mobility: 106 HR, 97% SpO2 During Mobility: 124 HR Post-Mobility: 111 HR  Pt received in bed and agreeable to get up to chair. Pt minA+1 for bed mobility and to stand. Left with call bell in reach and chair alarm on.   [place] Mobility Specialist  Please Contact via SecureChat or Rehab Office at [phone]

## 2022-10-13 NOTE — TOC Initial Note (Signed)
Transition of Care [facility]) - Initial/Assessment Note    Patient Details  Name: [name] MRN: [id number] Date of Birth: February 06, 1924  Transition of Care [facility]) CM/SW Contact:    Ronny Bacon, RN Phone Number: 10/13/2022, 4:46 PM  Clinical Narrative:  Spoke with granddaughter Judithann Sauger. Patient is connected to [place] services and has PT,OT,SLP,RN (Advance), 24 hour family paid aide care. Verified patient has DME at home; Ramp access, cane, RW, BSC, WC, Hospital bed. Granddaughter requesting patient go to [facility] for rehab prior to going home. Patient also has oxygen services through [employer].                   Barriers to Discharge: Continued Medical Work up   Patient Goals and CMS Choice Patient states their goals for this hospitalization and ongoing recovery are:: To go home          Expected Discharge Plan and Services   Discharge Planning Services: CM Consult   Living arrangements for the past 2 months: Single Family Home                                      Prior Living Arrangements/Services Living arrangements for the past 2 months: Single Family Home Lives with:: Adult Children Patient language and need for interpreter reviewed:: Yes Do you feel safe going back to the place where you live?: Yes      Need for Family Participation in Patient Care: Yes (Comment) Care giver support system in place?: Yes (comment) Current home services: DME, Home OT, Home PT, Home RN, Homehealth aide Criminal Activity/Legal Involvement Pertinent to Current Situation/Hospitalization: No - Comment as needed  Activities of Daily Living Home Assistive Devices/Equipment: None ADL Screening (condition at time of admission) Patient's cognitive ability adequate to safely complete daily activities?: Yes Is the patient deaf or have difficulty hearing?: Yes Does the patient have difficulty seeing, even when wearing glasses/contacts?: No Does the patient have  difficulty concentrating, remembering, or making decisions?: Yes Patient able to express need for assistance with ADLs?: Yes Does the patient have difficulty dressing or bathing?: No Independently performs ADLs?: No Does the patient have difficulty walking or climbing stairs?: No Weakness of Legs: None Weakness of Arms/Hands: None  Permission Sought/Granted Permission sought to share information with : Case Manager, Family Supports Permission granted to share information with : Yes, Verbal Permission Granted  Share Information with NAME: Judithann Sauger     Permission granted to share info w Relationship: Granddaughter     Emotional Assessment Appearance:: Appears stated age Attitude/Demeanor/Rapport: Lethargic Affect (typically observed): Quiet   Alcohol / Substance Use: Not Applicable Psych Involvement: No (comment)  Admission diagnosis:  CHF (congestive heart failure) ([name]) [I50.9] Pericardial effusion [I31.39] Pleural effusion [J90] Exertional dyspnea [R06.09] Patient Active Problem List   Diagnosis Date Noted   Pericardial effusion 10/08/2022   CHF (congestive heart failure) ([name]) 10/07/2022   Gangrene of toe of left foot ([name]) 10/07/2022   LVH (left ventricular hypertrophy) 09/11/2022   Coronary atherosclerosis 08/15/2022   Encounter for fitting and adjustment of hearing aid 08/15/2022   Hearing loss 08/15/2022   Hypertensive heart disease with heart failure ([name]) 08/15/2022   Muscle weakness (generalized) 08/15/2022   Need for assistance with personal care 08/15/2022   Encounter for issue of other medical certificate 91/47/8295   Encounter for other administrative examinations 08/15/2022   Other specified hearing loss,  bilateral 08/15/2022   Pain in joint, lower leg 08/15/2022   Problem related to unspecified psychosocial circumstances 08/15/2022   Cellulitis and abscess of toe of left foot 08/08/2022   Left leg cellulitis 08/08/2022   Bilateral pleural effusion  07/22/2022   DNR (do not resuscitate)/DNI(Do Not Intubate) 07/22/2022   Edema due to hypoalbuminemia 07/22/2022   Chronic hypoxic respiratory failure, on home oxygen therapy ([name]) - 4 L/min 07/22/2022   Pressure injury of skin 07/10/2022   Essential hypertension 07/06/2022   Alzheimer disease ([name]) 07/06/2022   Pleural effusion 07/06/2022   History of CVA (cerebrovascular accident) 07/06/2022   Ventral hernia 07/06/2022   Ulcer of lower extremity, limited to breakdown of skin ([name]) 05/30/2022   Urinary frequency 02/14/2022   Hypotension due to drugs 02/07/2022   Need for influenza vaccination 02/07/2022   Anemia due to chronic kidney disease 12/31/2021   Agitation due to dementia ([name]) 12/25/2021   Benign prostatic hyperplasia with urinary obstruction 12/05/2021   Lower extremity edema 12/05/2021   Scrotum swelling 12/05/2021   Elevated homocysteine 11/30/2021   Low magnesium level 11/30/2021   Balanitis 11/30/2021   Gastroesophageal reflux disease 10/22/2021   Urinary tract infection 10/22/2021   Chronic atrial fibrillation with RVR ([name]) 07/30/2021   Hypophosphatemia 07/29/2021   Stage 3b chronic kidney disease ([name]) 04/25/2021   Continuous leakage of urine 04/25/2021   At high risk for injury related to fall 04/25/2021   Hypocalcemia 10/30/2020   Vitamin D deficiency 10/30/2020   Edema due to malnutrition ([name]) 10/30/2020   Unsteady gait when walking 09/21/2020   Sarcopenia 09/21/2020   B12 deficiency 01/27/2020   Acute on chronic diastolic CHF (congestive heart failure) ([name]) 03/09/2019   Pain in both feet 11/17/2018   Neuropathy 10/09/2018   Insomnia 10/09/2018   Slow transit constipation 06/18/2018   Iron deficiency anemia 11/25/2017   Decreased appetite 10/16/2017   Hearing difficulty of both ears 10/16/2017   Allergic rhinitis due to pollen 09/03/2017   Asymmetric SNHL (sensorineural hearing loss) 08/14/2017   Tinnitus aurium, left 08/14/2017   Ventricular  tachycardia ([name]) 01/14/2017   Malnutrition of moderate degree 01/13/2017   Exertional dyspnea 01/12/2017   Hypertension    PVD (peripheral vascular disease) ([name]) 08/14/2016   CVA (cerebral infarction) 07/23/2015   Microcytic anemia 07/23/2015   Stroke (cerebrum) ([name]) 07/23/2015   CKD (chronic kidney disease), stage IV ([name]) 07/23/2015   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 03/15/2015   Primary open-angle glaucoma, left eye, indeterminate stage 03/15/2015   PCO (posterior capsule opacification) 11/29/2014   Ptosis of eyelid 07/27/2012   POAG (primary open-angle glaucoma) 11/27/2011   Trichiasis 11/27/2011   PPM-[employer]tr[employer] 03/13/2010   Type 2 diabetes mellitus without complication, without long-term current use of insulin ([name]) 11/14/2008   Atrial fibrillation ([name]) 11/14/2008   BRADYCARDIA-TACHYCARDIA SYNDROME s/p PPM 11/14/2008   PCP:  Mliss Sax, MD Pharmacy:   [facility] [phone] G[place] Otto, [place] - [address] [place] [address] Phone: [phone] Fax: [phone]     Social Determinants of Health (SDOH) Social History: SDOH Screenings   Food Insecurity: No Food Insecurity (10/09/2022)  Housing: Low Risk  (10/09/2022)  Transportation Needs: No Transportation Needs (10/09/2022)  Utilities: Not At Risk (10/09/2022)  Alcohol Screen: Low Risk  (08/19/2022)  Depression (PHQ2-9): Low Risk  (09/11/2022)  Financial Resource Strain: Low Risk  (08/19/2022)  Physical Activity: Sufficiently Active (08/19/2022)  Social Connections: Moderately Isolated (08/19/2022)  Stress: No Stress Concern Present (08/19/2022)  Tobacco Use: Medium Risk (10/09/2022)   SDOH Interventions:     Readmission Risk Interventions    10/13/2022    4:40 PM 08/08/2022    2:30 PM 07/08/2022   11:12 AM  Readmission Risk Prevention Plan  Transportation Screening Complete Complete Complete  Medication Review Oceanographer) Complete Complete Complete  PCP or Specialist  appointment within 3-5 days of discharge Complete Complete Complete  HRI or Home Care Consult Complete Complete Complete  SW Recovery Care/Counseling Consult Complete Complete Complete  Palliative Care Screening Not Applicable Complete Not Applicable  Skilled Nursing Facility Not Applicable Not Applicable Not Applicable

## 2022-10-13 NOTE — Progress Notes (Signed)
Rounding Note    Patient Name: [name] Date of Encounter: 10/13/2022  [facility] Cardiologist: Dr Servando Salina  Subjective   No CP or dyspnea  Inpatient Medications    Scheduled Meds:  apixaban  2.5 mg Oral BID   Chlorhexidine Gluconate Cloth  6 each Topical Daily   divalproex  125 mg Oral BID   docusate sodium  100 mg Oral Daily   feeding supplement  237 mL Oral QHS   finasteride  5 mg Oral Daily   furosemide  40 mg Oral Daily   insulin aspart  0-5 Units Subcutaneous QHS   insulin aspart  0-6 Units Subcutaneous TID WC   latanoprost  1 drop Both Eyes QHS   melatonin  3 mg Oral QHS   memantine  10 mg Oral BID   midodrine  10 mg Oral TID WC   pantoprazole  40 mg Oral Daily   sodium chloride flush  3 mL Intravenous Q12H   tamsulosin  0.4 mg Oral Daily   Continuous Infusions:   PRN Meds: acetaminophen **OR** acetaminophen, albuterol, ondansetron (ZOFRAN) IV, mouth rinse, sodium chloride flush   Vital Signs    Vitals:   10/12/22 2008 10/12/22 2341 10/13/22 0419 10/13/22 0729  BP: 105/82 93/76 (!) 96/59 108/76  Pulse: 100 99 (!) 103 98  Resp: 18 18 18 18   Temp: 97.7 F (36.5 C) 98.2 F (36.8 C) 97.7 F (36.5 C) 97.7 F (36.5 C)  TempSrc: Oral Oral Oral Oral  SpO2: 100% 95% 93% 97%  Weight:   61.3 kg   Height:        Intake/Output Summary (Last 24 hours) at 10/13/2022 0826 Last data filed at 10/12/2022 1800 Gross per 24 hour  Intake 340 ml  Output 100 ml  Net 240 ml       10/13/2022    4:19 AM 10/12/2022    2:45 PM 10/11/2022    5:00 AM  Last 3 Weights  Weight (lbs) 135 lb 2.3 oz 140 lb 3.4 oz 135 lb 5.8 oz  Weight (kg) 61.3 kg 63.6 kg 61.4 kg      Telemetry    Atrial fibrillation with mildly elevated rate - Personally Reviewed   Physical Exam   GEN: NAD; elderly Neck: supple Cardiac: irregular; no gallop; tachycardic Respiratory: CTA GI: Soft, NT/ND MS: trace edema Neuro:  Grossly intact Psych: Normal affect   Labs     High Sensitivity Troponin:   Recent Labs  Lab 10/07/22 1110 10/07/22 1249  TROPONINIHS 26* 27*      Chemistry Recent Labs  Lab 10/07/22 1110 10/08/22 0112 10/09/22 0043 10/10/22 0051 10/11/22 0141 10/12/22 0433 10/13/22 0120  NA 139  142   < > 136 136 136 136 135  K 4.4  4.4   < > 4.4 4.7 4.4 3.7 4.1  CL 104  103   < > 103 102 99 104 104  CO2 25   < > 24 24 24 22 22   GLUCOSE 177*  175*   < > 129* 161* 194* 115* 191*  BUN 27*  28*   < > 30* 30* 36* 35* 38*  CREATININE 2.30*  2.30*   < > 2.30* 2.26* 2.35* 1.98* 2.11*  CALCIUM 8.0*   < > 8.0* 8.1* 7.9* 7.6* 7.3*  MG 1.8  --   --  1.9  --   --  2.1  PROT 6.2*  --  5.6*  --   --   --   --  ALBUMIN 2.9*  --  2.6*  --  2.1* 1.7* 2.1*  AST 15  --  14*  --   --   --   --   ALT 9  --  8  --   --   --   --   ALKPHOS 68  --  66  --   --   --   --   BILITOT 0.4  --  0.8  --   --   --   --   GFRNONAA 25*   < > 25* 26* 25* 30* 28*  ANIONGAP 10   < > 9 10 13 10 9    < > = values in this interval not displayed.     Hematology Recent Labs  Lab 10/11/22 0141 10/12/22 0433 10/13/22 0120  WBC 9.4 9.0 8.9  RBC 4.43 4.32 4.37  HGB 11.0* 10.8* 11.0*  HCT 33.2* 32.0* 33.2*  MCV 74.9* 74.1* 76.0*  MCH 24.8* 25.0* 25.2*  MCHC 33.1 33.8 33.1  RDW 17.4* 17.3* 17.7*  PLT 166 148* 145*     BNP Recent Labs  Lab 10/07/22 1110  BNP 352.0*      Patient Profile     89 y.o. male with past medical history of hypertension, chronic diastolic congestive heart failure, history of pacemaker, atrial fibrillation, prior CVA, chronic stage IV kidney disease, diabetes mellitus admitted with dyspnea.  Echocardiogram showed normal LV function, severe left ventricular hypertrophy, severe left atrial enlargement, large pericardial effusion and trace aortic insufficiency.  Patient underwent pericardiocentesis on May 22.  Follow-up echocardiogram Oct 10, 2022 showed ejection fraction 45 to 50% with localized pericardial effusion adjacent to the  right atrium/right ventricle significantly decreased compared to previous.  Assessment & Plan    1 acute on chronic diastolic congestive heart failure-volume status is reasonable.  Continue Lasix at present dose.  2 pericardial effusion-patient is status post pericardiocentesis and follow-up echocardiogram showed much improved effusion.  Would repeat echocardiogram in 4 to 6 weeks.  3 persistent atrial fibrillation-blood pressure is somewhat low.  Heart rate is upper normal to mildly increased.  Will try low-dose metoprolol at 12.5 mg twice daily to see if his blood pressure tolerates.  Otherwise could treat with amiodarone.  4 hypotension-patient has been initiated on midodrine.  Blood pressure reasonable this morning.  5 pacemaker-follow-up electrophysiology.  6 chronic stage IV kidney disease-followed by nephrology.   For questions or updates, please contact [facility] Please consult [email or web address] for contact info under        Signed, Olga Millers, MD  10/13/2022, 8:26 AM

## 2022-10-13 NOTE — TOC Progression Note (Signed)
Transition of Care [facility]) - Progression Note    Patient Details  Name: [name] MRN: [id number] Date of Birth: Sep 19, 1923  Transition of Care [facility]) CM/SW Contact  Ronny Bacon, RN Phone Number: 10/13/2022, 4:01 PM  Clinical Narrative:   Attempt made to return phone call of granddaughter, no response.         Expected Discharge Plan and Services                                               Social Determinants of Health (SDOH) Interventions SDOH Screenings   Food Insecurity: No Food Insecurity (10/09/2022)  Housing: Low Risk  (10/09/2022)  Transportation Needs: No Transportation Needs (10/09/2022)  Utilities: Not At Risk (10/09/2022)  Alcohol Screen: Low Risk  (08/19/2022)  Depression (PHQ2-9): Low Risk  (09/11/2022)  Financial Resource Strain: Low Risk  (08/19/2022)  Physical Activity: Sufficiently Active (08/19/2022)  Social Connections: Moderately Isolated (08/19/2022)  Stress: No Stress Concern Present (08/19/2022)  Tobacco Use: Medium Risk (10/09/2022)    Readmission Risk Interventions    08/08/2022    2:30 PM 07/08/2022   11:12 AM  Readmission Risk Prevention Plan  Transportation Screening Complete Complete  Medication Review Oceanographer) Complete Complete  PCP or Specialist appointment within 3-5 days of discharge Complete Complete  HRI or Home Care Consult Complete Complete  SW Recovery Care/Counseling Consult Complete Complete  Palliative Care Screening Complete Not Applicable  Skilled Nursing Facility Not Applicable Not Applicable

## 2022-10-13 NOTE — Progress Notes (Addendum)
PROGRESS NOTE    [name]  WUJ:[id number] DOB: 03-20-1924 DOA: 10/07/2022 PCP: Mliss Sax, MD   Brief Narrative:  89 y.o. male with medical history significant of hypertension, heart failure with preserved EF, atrial fibrillation, tachybradycardia s/p pacemaker, CVA, peripheral arterial disease, CKD stage IV, diabetes mellitus type 2 who presents with complaints of shortness of breath.   In the emergency department patient was noted to be afebrile with respirations 12-23, and all other vital signs maintained. Lungs with no wheezing or rhonchi, heart with S1 and S2 present irregularly irregular with no gallops or murmur, abdomen with no distention and positive lower extremity edema. Left foot great toes with necrotic tissue.    Labs significant for WBC 7, hemoglobin 9.6, BUN 28, creatinine 2.3, ionized calcium 1.11, and BNP 352.     Chest radiograph with cardiomegaly, bilateral hilar vascular congestion, bilateral pleural effusions more left than right. Pacemaker in place with one lead in the right atrium and one in the right ventricle.    Patient has been given IV furosemide.Cardiology had been formally consulted.   Echocardiogram with large pericardial effusion with signs of tamponade. 05/22 pericardiocentesis 1 L removed and catheter left in place.  05/23 continue pericardiocentesis catheter in place.  05/24 pericardiocentesis drain has been,   Assessment & Plan:   Acute on chronic diastolic CHF (congestive heart failure) ([name]) -Echocardiogram with preserved LV systolic function with EF 50 to 55%, severe LVH, RVSP 38.9. LA with severe dilatation. Large pericardial effusion.  -Continue Lasix.  Continue strict INO's and daily weight. -Monitor electrolytes   Pericardial effusion  -S/p pericardiocentesis on 5/22- 1030 cc fluid removed, serosanguinous and somewhat dark.  -Follow-up echocardiogram showed much improved effusion.  Cardiology recommended repeat  echocardiogram in 4 to 6 weeks. -Catheter has been removed on 10/11/2022    CKD (chronic kidney disease), stage IV ([name]) -Evaluated by nephrology.  Creatinine is improving and around his baseline.  Nephrology signed off.   Anemia of chronic renal disease.  -Stable hgb.  Continue to monitor   Atrial fibrillation ([name]) -Persistent atrial fibrillation.  -Patient has diagnosis of tachy brady syndrome and had a pacemaker in place.  -Continue Eliquis.  Started on low-dose metoprolol 12.5 mg twice daily  Hypotension -Continue midodrine for blood pressure support.     Type 2 diabetes mellitus without complication, without long-term current use of insulin ([name]) -Continue sliding scale insulin and monitoring with insulin sliding scale.  -A1c 7.2%.   Alzheimer disease ([name]) -No agitation, continue with memantine and depakote.    History of CVA (cerebrovascular accident) -At baseline   Benign prostatic hyperplasia with urinary obstruction -Continue to monitor urine output.  -On proscar and flomax.    Gangrene of toe of left foot ([name]) Peripheral vascular disease: -Followed by podiatry outpatient.  After ABI of left leg were noted be abnormal and was sent to Dr. Kirke Corin.  Surgical intervention was recommended but family was waiting on clearance from his other providers.  He had similar surgery in 2018. -Recommend to follow-up outpatient  Constipation: -Increased Colace and added MiraLAX.   BPH: Continue Flomax  GERD: Continue PPI  Thrombocytopenia: Continue to monitor  DVT prophylaxis: Eliquis Code Status: DNR  Family Communication: None present at bedside.  Plan of care discussed with patient in length and he verbalized understanding and agreed with it.  I talked to patient's granddaughter on 10/12/2022.  I updated her plan of care and she appreciated the call.  Disposition Plan: to be determined  Consultants:  [facility]  Procedures:   pericardiocentensis  Antimicrobials:  none  Status is: Inpatient    Subjective: Patient seen and examined.  Eating breakfast.  Denies any new complaints.  No acute events overnight.  Has some nasal congestion and blurry vision from left eye which he thinks are related to allergies.  No shortness of breath, wheezing, chest pain palpitations  Per RN patient had not had bowel movement.  Patient denies abdominal pain, nausea or vomiting. Objective: Vitals:   10/13/22 0729 10/13/22 1037 10/13/22 1045 10/13/22 1203  BP: 108/76 95/71 92/61  (!) 92/57  Pulse: 98 95  (!) 102  Resp: 18 20    Temp: 97.7 F (36.5 C) 97.6 F (36.4 C)    TempSrc: Oral Oral    SpO2: 97% 96% 96%   Weight:      Height:        Intake/Output Summary (Last 24 hours) at 10/13/2022 1340 Last data filed at 10/13/2022 1300 Gross per 24 hour  Intake 700 ml  Output --  Net 700 ml    Filed Weights   10/11/22 0500 10/12/22 1445 10/13/22 0419  Weight: 61.4 kg 63.6 kg 61.3 kg    Examination:  General exam: Appears calm and comfortable, on RA, communicating well, eating breakfast Respiratory system: Clear to auscultation. Respiratory effort normal. Cardiovascular system: Irregular heart rhythm, no gallops or rubs.  No leg edema gastrointestinal system: Abdomen is nondistended, soft and nontender. No organomegaly or masses felt. Normal bowel sounds heard. Central nervous system: Alert and oriented. No focal neurological deficits. Extremities: Symmetric 5 x 5 power. Skin: No rashes, lesions or ulcers Psychiatry: Judgement and insight appear normal. Mood & affect appropriate.    Data Reviewed: I have personally reviewed following labs and imaging studies  CBC: Recent Labs  Lab 10/07/22 1110 10/08/22 0112 10/09/22 0703 10/10/22 0051 10/11/22 0141 10/12/22 0433 10/13/22 0120  WBC 7.0   < > 6.5 8.1 9.4 9.0 8.9  NEUTROABS 5.3  --   --   --   --   --   --   HGB 9.6*  11.2*   < > 10.1* 11.6* 11.0* 10.8*  11.0*  HCT 29.8*  33.0*   < > 30.3* 35.3* 33.2* 32.0* 33.2*  MCV 79.0*   < > 76.3* 76.2* 74.9* 74.1* 76.0*  PLT 162   < > 167 170 166 148* 145*   < > = values in this interval not displayed.    Basic Metabolic Panel: Recent Labs  Lab 10/07/22 1110 10/08/22 0112 10/09/22 0043 10/10/22 0051 10/11/22 0141 10/12/22 0433 10/13/22 0120  NA 139  142   < > 136 136 136 136 135  K 4.4  4.4   < > 4.4 4.7 4.4 3.7 4.1  CL 104  103   < > 103 102 99 104 104  CO2 25   < > 24 24 24 22 22   GLUCOSE 177*  175*   < > 129* 161* 194* 115* 191*  BUN 27*  28*   < > 30* 30* 36* 35* 38*  CREATININE 2.30*  2.30*   < > 2.30* 2.26* 2.35* 1.98* 2.11*  CALCIUM 8.0*   < > 8.0* 8.1* 7.9* 7.6* 7.3*  MG 1.8  --   --  1.9  --   --  2.1  PHOS  --   --   --   --  3.6 3.4 3.4   < > = values in this interval not displayed.  GFR: Estimated Creatinine Clearance: 17.8 mL/min (A) (by C-G formula based on SCr of 2.11 mg/dL (H)). Liver Function Tests: Recent Labs  Lab 10/07/22 1110 10/09/22 0043 10/11/22 0141 10/12/22 0433 10/13/22 0120  AST 15 14*  --   --   --   ALT 9 8  --   --   --   ALKPHOS 68 66  --   --   --   BILITOT 0.4 0.8  --   --   --   PROT 6.2* 5.6*  --   --   --   ALBUMIN 2.9* 2.6* 2.1* 1.7* 2.1*    No results for input(s): "LIPASE", "AMYLASE" in the last 168 hours. No results for input(s): "AMMONIA" in the last 168 hours. Coagulation Profile: No results for input(s): "INR", "PROTIME" in the last 168 hours. Cardiac Enzymes: No results for input(s): "CKTOTAL", "CKMB", "CKMBINDEX", "TROPONINI" in the last 168 hours. BNP (last 3 results) No results for input(s): "PROBNP" in the last 8760 hours. HbA1C: No results for input(s): "HGBA1C" in the last 72 hours. CBG: Recent Labs  Lab 10/12/22 1158 10/12/22 1638 10/12/22 2120 10/13/22 0556 10/13/22 1140  GLUCAP 118* 138* 178* 142* 182*    Lipid Profile: No results for input(s): "CHOL", "HDL", "LDLCALC", "TRIG", "CHOLHDL",  "LDLDIRECT" in the last 72 hours. Thyroid Function Tests: No results for input(s): "TSH", "T4TOTAL", "FREET4", "T3FREE", "THYROIDAB" in the last 72 hours. Anemia Panel: No results for input(s): "VITAMINB12", "FOLATE", "FERRITIN", "TIBC", "IRON", "RETICCTPCT" in the last 72 hours. Sepsis Labs: No results for input(s): "PROCALCITON", "LATICACIDVEN" in the last 168 hours.  Recent Results (from the past 240 hour(s))  Resp panel by RT-PCR (RSV, Flu A&B, Covid) Anterior Nasal Swab     Status: None   Collection Time: 10/07/22 10:38 AM   Specimen: Anterior Nasal Swab  Result Value Ref Range Status   SARS Coronavirus 2 by RT PCR NEGATIVE NEGATIVE Final   Influenza A by PCR NEGATIVE NEGATIVE Final   Influenza B by PCR NEGATIVE NEGATIVE Final    Comment: (NOTE) The Xpert Xpress SARS-CoV-2/FLU/RSV plus assay is intended as an aid in the diagnosis of influenza from Nasopharyngeal swab specimens and should not be used as a sole basis for treatment. Nasal washings and aspirates are unacceptable for Xpert Xpress SARS-CoV-2/FLU/RSV testing.  Fact Sheet for Patients: BloggerCourse.com  Fact Sheet for Healthcare Providers: SeriousBroker.it  This test is not yet approved or cleared by the [place] FDA and has been authorized for detection and/or diagnosis of SARS-CoV-2 by FDA under an Emergency Use Authorization (EUA). This EUA will remain in effect (meaning this test can be used) for the duration of the COVID-19 declaration under Section 564(b)(1) of the Act, 21 U.S.C. section 360bbb-3(b)(1), unless the authorization is terminated or revoked.     Resp Syncytial Virus by PCR NEGATIVE NEGATIVE Final    Comment: (NOTE) Fact Sheet for Patients: BloggerCourse.com  Fact Sheet for Healthcare Providers: SeriousBroker.it  This test is not yet approved or cleared by the [place] FDA and has  been authorized for detection and/or diagnosis of SARS-CoV-2 by FDA under an Emergency Use Authorization (EUA). This EUA will remain in effect (meaning this test can be used) for the duration of the COVID-19 declaration under Section 564(b)(1) of the Act, 21 U.S.C. section 360bbb-3(b)(1), unless the authorization is terminated or revoked.  Performed at [facility], [address] [place], [place] [address]   Culture, body fluid w Gram Stain-bottle     Status:  None (Preliminary result)   Collection Time: 10/09/22 10:22 AM   Specimen: Path fluid  Result Value Ref Range Status   Specimen Description FLUID  Final   Special Requests NONE  Final   Culture   Final    NO GROWTH 4 DAYS Performed at [facility], [address], [place], [place] [address]    Report Status PENDING  Incomplete  Gram stain     Status: None   Collection Time: 10/09/22 10:22 AM   Specimen: Path fluid  Result Value Ref Range Status   Specimen Description FLUID  Final   Special Requests NONE  Final   Gram Stain   Final    FEW WBC PRESENT,BOTH PMN AND MONONUCLEAR NO ORGANISMS SEEN Performed at [facility], [address] [place], [place] [address]    Report Status 10/09/2022 FINAL  Final      Radiology Studies: No results found.  Scheduled Meds:  apixaban  2.5 mg Oral BID   Chlorhexidine Gluconate Cloth  6 each Topical Daily   divalproex  125 mg Oral BID   docusate sodium  100 mg Oral BID   feeding supplement  237 mL Oral QHS   finasteride  5 mg Oral Daily   furosemide  40 mg Oral Daily   insulin aspart  0-5 Units Subcutaneous QHS   insulin aspart  0-6 Units Subcutaneous TID WC   latanoprost  1 drop Both Eyes QHS   melatonin  3 mg Oral QHS   memantine  10 mg Oral BID   metoprolol tartrate  12.5 mg Oral BID   midodrine  5 mg Oral TID WC   pantoprazole  40 mg Oral Daily   polyethylene glycol  17 g Oral Daily   sodium chloride flush  3 mL Intravenous Q12H   tamsulosin  0.4 mg  Oral Daily   Continuous Infusions:     LOS: 6 days   Time spent: 35 minutes   Angelino Rumery Estill Cotta, MD [facility]  If 7PM-7AM, please contact night-coverage [email or web address] 10/13/2022, 1:40 PM

## 2022-10-14 ENCOUNTER — Other Ambulatory Visit: Payer: Self-pay | Admitting: Physician Assistant

## 2022-10-14 DIAGNOSIS — I3139 Other pericardial effusion (noninflammatory): Secondary | ICD-10-CM | POA: Diagnosis not present

## 2022-10-14 DIAGNOSIS — I4821 Permanent atrial fibrillation: Secondary | ICD-10-CM | POA: Diagnosis not present

## 2022-10-14 DIAGNOSIS — I5033 Acute on chronic diastolic (congestive) heart failure: Secondary | ICD-10-CM | POA: Diagnosis not present

## 2022-10-14 LAB — CBC
HCT: 30.9 % — ABNORMAL LOW (ref 39.0–52.0)
Hemoglobin: 10.3 g/dL — ABNORMAL LOW (ref 13.0–17.0)
MCH: 24.9 pg — ABNORMAL LOW (ref 26.0–34.0)
MCHC: 33.3 g/dL (ref 30.0–36.0)
MCV: 74.6 fL — ABNORMAL LOW (ref 80.0–100.0)
Platelets: 135 10*3/uL — ABNORMAL LOW (ref 150–400)
RBC: 4.14 MIL/uL — ABNORMAL LOW (ref 4.22–5.81)
RDW: 17.5 % — ABNORMAL HIGH (ref 11.5–15.5)
WBC: 8.7 10*3/uL (ref 4.0–10.5)
nRBC: 0 % (ref 0.0–0.2)

## 2022-10-14 LAB — BASIC METABOLIC PANEL
Anion gap: 11 (ref 5–15)
BUN: 38 mg/dL — ABNORMAL HIGH (ref 8–23)
CO2: 23 mmol/L (ref 22–32)
Calcium: 7.4 mg/dL — ABNORMAL LOW (ref 8.9–10.3)
Chloride: 104 mmol/L (ref 98–111)
Creatinine, Ser: 2 mg/dL — ABNORMAL HIGH (ref 0.61–1.24)
GFR, Estimated: 30 mL/min — ABNORMAL LOW (ref >60)
Glucose, Bld: 142 mg/dL — ABNORMAL HIGH (ref 70–99)
Potassium: 4.7 mmol/L (ref 3.5–5.1)
Sodium: 138 mmol/L (ref 135–145)

## 2022-10-14 LAB — GLUCOSE, CAPILLARY
Glucose-Capillary: 133 mg/dL — ABNORMAL HIGH (ref 70–99)
Glucose-Capillary: 146 mg/dL — ABNORMAL HIGH (ref 70–99)
Glucose-Capillary: 179 mg/dL — ABNORMAL HIGH (ref 70–99)
Glucose-Capillary: 214 mg/dL — ABNORMAL HIGH (ref 70–99)

## 2022-10-14 LAB — CULTURE, BODY FLUID W GRAM STAIN -BOTTLE

## 2022-10-14 LAB — MAGNESIUM: Magnesium: 2.1 mg/dL (ref 1.7–2.4)

## 2022-10-14 MED ORDER — METOPROLOL TARTRATE 25 MG PO TABS: 12.5000 mg | ORAL_TABLET | Freq: Two times a day (BID) | ORAL | Status: DC

## 2022-10-14 MED ORDER — MIDODRINE HCL 5 MG PO TABS: 5.0000 mg | ORAL_TABLET | Freq: Three times a day (TID) | ORAL | Status: DC

## 2022-10-14 MED ORDER — FUROSEMIDE 20 MG PO TABS: 40.0000 mg | ORAL_TABLET | Freq: Every day | ORAL | Status: DC

## 2022-10-14 NOTE — Progress Notes (Signed)
Rounding Note    Patient Name: [name] Date of Encounter: 10/14/2022  [facility] Cardiologist: Dr Servando Salina  Subjective   Pt denies CP or dyspnea  Inpatient Medications    Scheduled Meds:  apixaban  2.5 mg Oral BID   Chlorhexidine Gluconate Cloth  6 each Topical Daily   divalproex  125 mg Oral BID   docusate sodium  100 mg Oral BID   feeding supplement  237 mL Oral QHS   finasteride  5 mg Oral Daily   furosemide  40 mg Oral Daily   insulin aspart  0-5 Units Subcutaneous QHS   insulin aspart  0-6 Units Subcutaneous TID WC   latanoprost  1 drop Both Eyes QHS   melatonin  3 mg Oral QHS   memantine  10 mg Oral BID   metoprolol tartrate  12.5 mg Oral BID   midodrine  5 mg Oral TID WC   pantoprazole  40 mg Oral Daily   polyethylene glycol  17 g Oral Daily   sodium chloride flush  3 mL Intravenous Q12H   tamsulosin  0.4 mg Oral Daily   Continuous Infusions:   PRN Meds: acetaminophen **OR** acetaminophen, albuterol, ondansetron (ZOFRAN) IV, mouth rinse, sodium chloride flush   Vital Signs    Vitals:   10/13/22 1956 10/14/22 0020 10/14/22 0034 10/14/22 0432  BP: 108/75 100/67 107/75 102/78  Pulse: 91 (!) 102  94  Resp: 18 18 18 18   Temp: 98.2 F (36.8 C) 98 F (36.7 C) 98 F (36.7 C) 97.8 F (36.6 C)  TempSrc: Oral Oral Oral Oral  SpO2: 95% 96% 96%   Weight:    63.4 kg  Height:        Intake/Output Summary (Last 24 hours) at 10/14/2022 0736 Last data filed at 10/14/2022 0437 Gross per 24 hour  Intake 817 ml  Output 900 ml  Net -83 ml       10/14/2022    4:32 AM 10/13/2022    4:19 AM 10/12/2022    2:45 PM  Last 3 Weights  Weight (lbs) 139 lb 12.4 oz 135 lb 2.3 oz 140 lb 3.4 oz  Weight (kg) 63.4 kg 61.3 kg 63.6 kg      Telemetry    Atrial fibrillation rate improved; NSVT - Personally Reviewed   Physical Exam   GEN: WD, frail, NAD Neck: supple, no JVD Cardiac: irregular; normal rate Respiratory: CTA; no wheeze GI: Soft, not  tender, not distended MS: trace edema; chronic skin changes Neuro:  No focal findings Psych: Normal affect   Labs    High Sensitivity Troponin:   Recent Labs  Lab 10/07/22 1110 10/07/22 1249  TROPONINIHS 26* 27*      Chemistry Recent Labs  Lab 10/07/22 1110 10/08/22 0112 10/09/22 0043 10/10/22 0051 10/11/22 0141 10/12/22 0433 10/13/22 0120 10/14/22 0126  NA 139  142   < > 136 136 136 136 135 138  K 4.4  4.4   < > 4.4 4.7 4.4 3.7 4.1 4.7  CL 104  103   < > 103 102 99 104 104 104  CO2 25   < > 24 24 24 22 22 23   GLUCOSE 177*  175*   < > 129* 161* 194* 115* 191* 142*  BUN 27*  28*   < > 30* 30* 36* 35* 38* 38*  CREATININE 2.30*  2.30*   < > 2.30* 2.26* 2.35* 1.98* 2.11* 2.00*  CALCIUM 8.0*   < > 8.0*  8.1* 7.9* 7.6* 7.3* 7.4*  MG 1.8  --   --  1.9  --   --  2.1 2.1  PROT 6.2*  --  5.6*  --   --   --   --   --   ALBUMIN 2.9*  --  2.6*  --  2.1* 1.7* 2.1*  --   AST 15  --  14*  --   --   --   --   --   ALT 9  --  8  --   --   --   --   --   ALKPHOS 68  --  66  --   --   --   --   --   BILITOT 0.4  --  0.8  --   --   --   --   --   GFRNONAA 25*   < > 25* 26* 25* 30* 28* 30*  ANIONGAP 10   < > 9 10 13 10 9 11    < > = values in this interval not displayed.     Hematology Recent Labs  Lab 10/12/22 0433 10/13/22 0120 10/14/22 0126  WBC 9.0 8.9 8.7  RBC 4.32 4.37 4.14*  HGB 10.8* 11.0* 10.3*  HCT 32.0* 33.2* 30.9*  MCV 74.1* 76.0* 74.6*  MCH 25.0* 25.2* 24.9*  MCHC 33.8 33.1 33.3  RDW 17.3* 17.7* 17.5*  PLT 148* 145* 135*     BNP Recent Labs  Lab 10/07/22 1110  BNP 352.0*      Patient Profile     89 y.o. male with past medical history of hypertension, chronic diastolic congestive heart failure, history of pacemaker, atrial fibrillation, prior CVA, chronic stage IV kidney disease, diabetes mellitus admitted with dyspnea.  Echocardiogram showed normal LV function, severe left ventricular hypertrophy, severe left atrial enlargement, large pericardial  effusion and trace aortic insufficiency.  Patient underwent pericardiocentesis on May 22.  Follow-up echocardiogram Oct 10, 2022 showed ejection fraction 45 to 50% with localized pericardial effusion adjacent to the right atrium/right ventricle significantly decreased compared to previous.  Assessment & Plan    1 acute on chronic diastolic congestive heart failure-patient appears to be euvolemic on examination.  Will continue diuretic at present dose.   2 pericardial effusion-patient is status post pericardiocentesis and follow-up echocardiogram showed much improved effusion.  Would repeat echocardiogram in 4 to 6 weeks following discharge.  3 persistent atrial fibrillation-heart rate is controlled.  Continue metoprolol at present dose.  Continue apixaban.  4 hypotension-blood pressure is reasonable.  Continue midodrine.  5 pacemaker-follow-up electrophysiology.  6 chronic stage IV kidney disease-followed by nephrology.  Patient can be discharged from a cardiac standpoint.  Will arrange follow-up with APP in 2 weeks.  Will plan repeat echocardiogram in 4 to 6 weeks for pericardial fusion status post recent pericardiocentesis.   For questions or updates, please contact [facility] Please consult [email or web address] for contact info under        Signed, Olga Millers, MD  10/14/2022, 7:36 AM

## 2022-10-14 NOTE — Discharge Summary (Signed)
Physician Discharge Summary  [name] FAO:[id number] DOB: 16-Sep-1923 DOA: 10/07/2022  PCP: Mliss Sax, MD  Admit date: 10/07/2022 Discharge date: 10/15/2022  Time spent: 45 minutes  Recommendations for Outpatient Follow-up:  Cardiology follow-up with APP 6/11 Repeat echo in 4 to 6 weeks and follow-up with Dr. Kirke Corin 7/9 Outpatient palliative care, continue goals of care discussions   Discharge Diagnoses:  Active Problems:   Acute on chronic diastolic CHF Large pericardial effusion with tamponade   Atrial fibrillation ([name])   CKD (chronic kidney disease), stage IV ([name])   PVD (peripheral vascular disease) ([name])   Hypertension   Type 2 diabetes mellitus without complication, without long-term current use of insulin ([name])   Alzheimer disease ([name])   History of CVA (cerebrovascular accident)   Benign prostatic hyperplasia with urinary obstruction   Gangrene of toe of left foot [facility])   Discharge Condition: Improved  Diet recommendation: [place], diabetic  Filed Weights   10/13/22 0419 10/14/22 0432 10/15/22 0500  Weight: 61.3 kg 63.4 kg 62.7 kg    History of present illness:  89 y.o. male with medical history significant of hypertension, heart failure with preserved EF, atrial fibrillation, tachybradycardia s/p pacemaker, CVA, peripheral arterial disease, CKD stage IV, diabetes mellitus type 2 who presents with complaints of shortness of breath.  -In the ED noted to be volume overloaded -Labs significant for WBC 7, hemoglobin 9.6, BUN 28, creatinine 2.3, ionized calcium 1.11, and BNP 352.    -Chest radiograph with cardiomegaly, bilateral hilar vascular congestion, bilateral pleural effusions more left than right. Pacemaker in place with one lead in the right atrium and one in the right ventricle.   Hospital Course:   Acute on chronic diastolic CHF (congestive heart failure) ([name]) -Echocardiogram with preserved LV systolic function with EF 50 to 55%,  severe LVH, RVSP 38.9. LA with severe dilatation. Large pericardial effusion.  -Briefly diuresed with IV Lasix, treated for pericardial effusion as noted below, now changed to oral Lasix 40 Mg daily, follow-up with cardiology on 6/11   Pericardial effusion  -S/p pericardiocentesis on 5/22- 1030 cc fluid removed, serosanguinous and somewhat dark.  -Follow-up echocardiogram showed much improved effusion.  Cardiology recommended repeat echocardiogram in 4 to 6 weeks. -Catheter has been removed on 10/11/2022 -Cleared for discharge from a cardiology standpoint, follow-up arranged with repeat echo    CKD (chronic kidney disease), stage IV ([name]) -Evaluated by nephrology.  Creatinine is improving and around his baseline.  Nephrology signed off. -Baseline creatinine around 2   Anemia of chronic renal disease.  -Stable hgb   Atrial fibrillation ([name]) -Persistent atrial fibrillation.  -Patient has diagnosis of tachy brady syndrome and had a pacemaker in place.  -Continue Eliquis.  Started on low-dose metoprolol 12.5 mg twice daily   Hypotension -Continue midodrine for blood pressure support.     Type 2 diabetes mellitus without complication, without long-term current use of insulin ([name]) -Diet controlled -A1c 7.2%.   Alzheimer disease ([name]) -No agitation, continue with memantine and depakote.    History of CVA (cerebrovascular accident) -At baseline   Benign prostatic hyperplasia with urinary obstruction -On proscar and flomax.    Gangrene of toe of left foot ([name]) Peripheral vascular disease: -Followed by podiatry outpatient.  After ABI of left leg were noted be abnormal and was sent to Dr. Kirke Corin.  Surgical intervention was recommended but family was waiting on clearance from his other providers.  He had similar surgery in 2018. -Recommend to follow-up outpatient   Constipation: -  Improved, continue MiraLAX and Colace   BPH: Continue Flomax   GERD: Continue PPI    Thrombocytopenia: Mild    Code Status: DNR, outpatient palliative care referral sent due to advanced age, frailty dementia and CHF  Procedures: 5/22, pericardiocentesis, 1 L of serosanguineous fluid drained  Consultations: Cardiology  Discharge Exam: Vitals:   10/15/22 0716 10/15/22 0807  BP: 90/62 108/71  Pulse:    Resp: 19   Temp: (!) 97.5 F (36.4 C)   SpO2: 100%    Elderly male sitting up in bed, AAO x 2, mild cognitive deficits CVS: S1-S2, regular rhythm Lungs: Clear bilaterally Abdomen: Soft, nontender, bowel sounds present Extremities: Trace edema  Discharge Instructions    Allergies as of 10/15/2022   No Known Allergies      Medication List     STOP taking these medications    atorvastatin 10 MG tablet Commonly known as: LIPITOR   metoprolol succinate 25 MG 24 hr tablet Commonly known as: TOPROL-XL       TAKE these medications    apixaban 2.5 MG Tabs tablet Commonly known as: ELIQUIS Take 1 tablet (2.5 mg total) by mouth 2 (two) times daily.   divalproex 125 MG capsule Commonly known as: DEPAKOTE SPRINKLE Take 1 capsule (125 mg total) by mouth 2 (two) times daily.   docusate sodium 100 MG capsule Commonly known as: COLACE Take 1 capsule (100 mg total) by mouth daily.   finasteride 5 MG tablet Commonly known as: PROSCAR TAKE 1 TABLET (5 MG TOTAL) BY MOUTH DAILY.   furosemide 20 MG tablet Commonly known as: LASIX Take 2 tablets (40 mg total) by mouth daily. What changed: how much to take   Iron (Ferrous Sulfate) 325 (65 Fe) MG Tabs Take 1 tablet every other day. What changed:  how much to take how to take this when to take this additional instructions   latanoprost 0.005 % ophthalmic solution Commonly known as: XALATAN Place 1 drop into both eyes at bedtime.   loratadine 10 MG tablet Commonly known as: CLARITIN Take 10 mg by mouth daily as needed for rhinitis.   Melatonin 3 MG Tbdp Take 1 tablet by mouth at bedtime.    memantine 10 MG tablet Commonly known as: NAMENDA Take 1 tablet (10 mg total) by mouth 2 (two) times daily.   metoprolol tartrate 25 MG tablet Commonly known as: LOPRESSOR Take 0.5 tablets (12.5 mg total) by mouth 2 (two) times daily.   midodrine 5 MG tablet Commonly known as: PROAMATINE Take 1 tablet (5 mg total) by mouth 3 (three) times daily with meals. What changed:  medication strength how much to take   pantoprazole 40 MG tablet Commonly known as: PROTONIX TAKE 1 TABLET(40 MG) BY MOUTH DAILY What changed:  how much to take how to take this when to take this additional instructions   tamsulosin 0.4 MG Caps capsule Commonly known as: FLOMAX Take 1 capsule (0.4 mg total) by mouth daily.       No Known Allergies  Follow-up Information     Carlos Levering, NP Follow up.   Specialty: Cardiology Why: Humberto Seals - [facility] location - cardiology follow-up on Tuesday October 29, 2022 10:55 AM (Arrive by 10:40 AM).   The office will also call you to arrange a follow-up heart ultrasound in 4-6 weeks - keep in mind the heart ultrasound may be at the [facility] location so please be sure to check with the scheduler that calls you to set this up.  Contact information: [address] [place] [address] [phone]                  The results of significant diagnostics from this hospitalization (including imaging, microbiology, ancillary and laboratory) are listed below for reference.    Significant Diagnostic Studies: ECHOCARDIOGRAM LIMITED  Result Date: 10/10/2022    ECHOCARDIOGRAM LIMITED REPORT   Patient Name:   Rigoverto Stonebarger Date of Exam: 10/10/2022 Medical Rec #:  [id number]             Height:       71.0 in Accession #:    [id number]            Weight:       139.6 lb Date of Birth:  [birth year]            BSA:          1.810 m Patient Age:    89 years              BP:           84/67 mmHg Patient Gender: M                     HR:            83 bpm. Exam Location:  Inpatient Procedure: Limited Echo Indications:    Pericardial Effusion, recheck with pericardial drain in place.  History:        Patient has prior history of Echocardiogram examinations, most                 recent 10/09/2022.  Sonographer:    Irving Burton Senior RDCS Referring Phys: Thomasene Ripple  Sonographer Comments: Technically difficult due to thin body habitus. IMPRESSIONS  1. Technically difficult study with limited visualization of cardiac structures.  2. The LV is not well visualized, however, on limited views appears to have mild-moderate LVH with mildly reduced systolic function (EF 45%-50%).  3. Right ventricular systolic function was not well visualized, however, appears normal.  4. There is a fibrinous and localized pericardial effusion measuring 1.53cm adjacent to the RA/RV. The IVC now measures 1.53 cm. There are no obvious signs of tamponade. Compared to prior echocardiogram (10/09/22) there has been significant decrease in the effusion. FINDINGS  Left Ventricle: Left ventricular endocardial border not optimally defined to evaluate regional wall motion. Right Ventricle: Right ventricular systolic function was not well visualized. Left Atrium: Left atrial size was not well visualized. Pericardium: Fibrinous pericardial effusion measuring up to 1.53cm. A moderately sized pericardial effusion is present. The pericardial effusion appears to contain fibrous material. Mitral Valve: The mitral valve was not assessed. Tricuspid Valve: The tricuspid valve is not assessed. Aortic Valve: The aortic valve was not assessed. Pulmonic Valve: The pulmonic valve was not assessed. Aorta: Aortic root could not be assessed. Venous: The inferior vena cava is normal in size with greater than 50% respiratory variability, suggesting right atrial pressure of 3 mmHg. Additional Comments: A device lead is visualized.  Dorthula Nettles Electronically signed by Dorthula Nettles Signature Date/Time:  10/10/2022/3:27:36 PM    Final    ECHOCARDIOGRAM LIMITED  Result Date: 10/09/2022    ECHOCARDIOGRAM LIMITED REPORT   Patient Name:   HAIDAN RIN [facility] Date of Exam: 10/09/2022 Medical Rec #:  [id number]             Height:       71.0 in Accession #:    [id number]  Weight:       145.7 lb Date of Birth:  [birth year]            BSA:          1.843 m Patient Age:    89 years              BP:           111/81 mmHg Patient Gender: M                     HR:           80 bpm. Exam Location:  Inpatient Procedure: Limited Echo and Echo Guidance/Pericardial Tap Indications:    I31.3 Pericardial effusion  History:        Patient has prior history of Echocardiogram examinations, most                 recent 10/07/2022. CHF, Arrythmias:Atrial Fibrillation; Risk                 Factors:Hypertension and Diabetes.  Sonographer:    Irving Burton Senior RDCS Referring Phys: [id number] Corky Crafts  Sonographer Comments: Pericardiocentesis IMPRESSIONS  1. 2.5 cm pericardial effusion with RV collapse. Catheter noted in pericardial space, apical. Post tap, trivial pericardial effusion with normal RV function. Pleural effusion noted. Large pericardial effusion. The pericardial effusion is circumferential. FINDINGS  Pericardium: 2.5 cm pericardial effusion with RV collapse. Catheter noted in pericardial space, apical. Post tap, trivial pericardial effusion with normal RV function. Pleural effusion noted. A large pericardial effusion is present. The pericardial effusion is circumferential. Donato Schultz MD Electronically signed by Donato Schultz MD Signature Date/Time: 10/09/2022/11:54:56 AM    Final    CARDIAC CATHETERIZATION  Result Date: 10/09/2022   Successful pericardiocentesis with 1030 cc fluid removal.  Fluid was serosanguinous and somewhat dark. Will leave drain in place overnight.  Would hopefully be able to remove tomorrow based on drainage.  Results conveyed to granddaughter.   DG Chest 2 View  Result Date:  10/08/2022 CLINICAL DATA:  Pleural effusion EXAM: CHEST - 2 VIEW COMPARISON:  10/07/2022 FINDINGS: Left-sided implanted cardiac device remains in place. Heart size appears enlarged although is partially obscured. Large left and small right pleural effusions, slightly increased. Increasing bibasilar airspace opacities. No pneumothorax. IMPRESSION: Large left and small right pleural effusions, slightly increased. Increasing bibasilar airspace opacities. Electronically Signed   By: Duanne Guess D.O.   On: 10/08/2022 08:31   CUP PACEART REMOTE DEVICE CHECK  Result Date: 10/07/2022 Scheduled remote reviewed. Normal device function.  11 VHR classified events, 4 sec. Next remote 91 days. MC, CVRS.  ECHOCARDIOGRAM COMPLETE  Result Date: 10/07/2022    ECHOCARDIOGRAM REPORT   Patient Name:   MIKKOS REINER [facility] Date of Exam: 10/07/2022 Medical Rec #:  [id number]             Height:       71.0 in Accession #:    [id number]            Weight:       152.8 lb Date of Birth:  Nov 03, 1923            BSA:          1.881 m Patient Age:    89 years              BP:           119/85 mmHg Patient Gender: M  HR:           96 bpm. Exam Location:  Inpatient Procedure: 2D Echo, Cardiac Doppler, Color Doppler and Intracardiac            Opacification Agent Indications:    Pericardial effusion I31.3  History:        Patient has prior history of Echocardiogram examinations, most                 recent 07/07/2022. CHF, Stroke, Arrythmias:Atrial Fibrillation,                 Bradycardia and Tachycardia, Signs/Symptoms:Hypotension and                 Dyspnea; Risk Factors:Hypertension and Diabetes. CKD, stage 3.  Sonographer:    Lucendia Herrlich Referring Phys: [id number] RONDELL A SMITH  Sonographer Comments: Image acquisition challenging due to patient behavioral factors. IMPRESSIONS  1. Left ventricular ejection fraction, by estimation, is 50 to 55%. The left ventricle has low normal function. The left ventricle has  no regional wall motion abnormalities. There is severe left ventricular hypertrophy. Left ventricular diastolic parameters are indeterminate.  2. Right ventricular systolic function is normal. The right ventricular size is normal. There is mildly elevated pulmonary artery systolic pressure.  3. Left atrial size was severely dilated.  4. Large pericardial effusion. There is no evidence of cardiac tamponade.  5. The mitral valve was not well visualized. Trivial mitral valve regurgitation. No evidence of mitral stenosis.  6. The aortic valve is tricuspid. There is mild calcification of the aortic valve. There is mild thickening of the aortic valve. Aortic valve regurgitation is trivial. Aortic valve sclerosis is present, with no evidence of aortic valve stenosis.  7. The inferior vena cava is dilated in size with >50% respiratory variability, suggesting right atrial pressure of 8 mmHg. Comparison(s): Changes from prior study are noted. Pericardial effusion is larger than prior, though no echo evidence of tamponade. FINDINGS  Left Ventricle: Left ventricular ejection fraction, by estimation, is 50 to 55%. The left ventricle has low normal function. The left ventricle has no regional wall motion abnormalities. Definity contrast agent was given IV to delineate the left ventricular endocardial borders. The left ventricular internal cavity size was normal in size. There is severe left ventricular hypertrophy. Left ventricular diastolic parameters are indeterminate. Right Ventricle: The right ventricular size is normal. Right vetricular wall thickness was not well visualized. Right ventricular systolic function is normal. There is mildly elevated pulmonary artery systolic pressure. The tricuspid regurgitant velocity  is 2.78 m/s, and with an assumed right atrial pressure of 8 mmHg, the estimated right ventricular systolic pressure is 38.9 mmHg. Left Atrium: Left atrial size was severely dilated. Right Atrium: Right atrial  size was normal in size. Pericardium: Large pericardial effusion, maximum diameter 3.72 cm posterior to LV. Adherent material seen in pericardial fluid, but fluid generally low density. A large pericardial effusion is present. There is no evidence of cardiac tamponade. Mitral Valve: The mitral valve was not well visualized. Trivial mitral valve regurgitation. No evidence of mitral valve stenosis. Tricuspid Valve: The tricuspid valve is normal in structure. Tricuspid valve regurgitation is mild . No evidence of tricuspid stenosis. Aortic Valve: The aortic valve is tricuspid. There is mild calcification of the aortic valve. There is mild thickening of the aortic valve. Aortic valve regurgitation is trivial. Aortic valve sclerosis is present, with no evidence of aortic valve stenosis. Aortic valve peak gradient measures 2.6 mmHg. Pulmonic Valve: The  pulmonic valve was not well visualized. Pulmonic valve regurgitation is not visualized. No evidence of pulmonic stenosis. Aorta: The aortic root, ascending aorta, aortic arch and descending aorta are all structurally normal, with no evidence of dilitation or obstruction. Venous: The inferior vena cava is dilated in size with greater than 50% respiratory variability, suggesting right atrial pressure of 8 mmHg. IAS/Shunts: The atrial septum is grossly normal. Additional Comments: A device lead is visualized in the right ventricle and right atrium.  LEFT VENTRICLE PLAX 2D LVIDd:         3.60 cm   Diastology LVIDs:         2.70 cm   LV e' lateral:   10.70 cm/s LV PW:         1.30 cm   LV E/e' lateral: 4.9 LV IVS:        1.80 cm LVOT diam:     2.10 cm LV SV:         31 LV SV Index:   17 LVOT Area:     3.46 cm  RIGHT VENTRICLE             IVC RV S prime:     14.30 cm/s  IVC diam: 2.00 cm LEFT ATRIUM            Index        RIGHT ATRIUM           Index LA diam:      4.30 cm  2.29 cm/m   RA Area:     15.10 cm LA Vol (A2C): 89.7 ml  47.70 ml/m  RA Volume:   32.70 ml  17.39 ml/m  LA Vol (A4C): 112.0 ml 59.56 ml/m  AORTIC VALVE AV Area (Vmax): 2.60 cm AV Vmax:        80.10 cm/s AV Peak Grad:   2.6 mmHg LVOT Vmax:      60.05 cm/s LVOT Vmean:     38.650 cm/s LVOT VTI:       0.091 m  AORTA Ao Root diam: 4.00 cm Ao Asc diam:  3.60 cm MITRAL VALVE               TRICUSPID VALVE MV Area (PHT): 6.54 cm    TR Peak grad:   30.9 mmHg MV Decel Time: 116 msec    TR Vmax:        278.00 cm/s MV E velocity: 52.00 cm/s MV A velocity: 35.50 cm/s  SHUNTS MV E/A ratio:  1.46        Systemic VTI:  0.09 m                            Systemic Diam: 2.10 cm Jodelle Red MD Electronically signed by Jodelle Red MD Signature Date/Time: 10/07/2022/7:54:10 PM    Final    DG Chest Port 1 View  Result Date: 10/07/2022 CLINICAL DATA:  Exertional dyspnea EXAM: PORTABLE CHEST 1 VIEW COMPARISON:  X-ray 07/25/2022 FINDINGS: Increasing now moderate to large left effusion. Increasing small right effusion. Adjacent opacity. Prominent central vasculature. Enlarged heart. Calcified aorta. No pneumothorax. Left upper chest pacemaker. Overlapping cardiac leads. Surgical clips in the upper abdomen. IMPRESSION: Increasing pleural effusions, moderate to large left and small right with the adjacent opacity. Vascular congestion. Electronically Signed   By: Karen Kays M.D.   On: 10/07/2022 11:07   VAS [place] PAD ABI  Result Date: 09/16/2022  LOWER EXTREMITY DOPPLER STUDY Patient Name:  Radonna Ricker  Date of Exam:   09/16/2022 Medical Rec #: [id number]              Accession #:    [id number] Date of Birth: Mar 26, 1924             Patient Gender: M Patient Age:   75 years Exam Location:  [facility] Procedure:      VAS [place] ABI WITH/WO TBI Referring Phys: ADAM MCDONALD --------------------------------------------------------------------------------  Indications: Ulceration, and peripheral artery disease. Patient presents with a              bloody blister that started in March. It was lanced at the [facility] on 08/15/22. Follow-up evaluation showed some              improvement but had some residual bulla formation and local              ulceration on the great toe. Noninvasive vascular testing ordered.              Patient does have a history of critical limb ischemia on the left              with complex revascularization in 2018 for occluded left popliteal              and TP trunk. High Risk Factors: Diabetes, past history of smoking, prior CVA.  Vascular Interventions: Successful complex endovascular intervention of an                         occluded left popliteal artery and TP trunk using a                         retrograde access via the posterior tibial artery; left                         popliteal artery angioplasty and stent placement and                         left TP trunk angioplasty and drug-eluting stent                         placement into the posterior tibial artery on                         08/14/2016. Comparison Study: Previous ABIs performed 07/13/2020 showed right ABI of 1.24 and                   left ABI of 1.20 Performing Technologist: Olegario Shearer RVT  Examination Guidelines: A complete evaluation includes at minimum, Doppler waveform signals and systolic blood pressure reading at the level of bilateral brachial, anterior tibial, and posterior tibial arteries, when vessel segments are accessible. Bilateral testing is considered an integral part of a complete examination. Photoelectric Plethysmograph (PPG) waveforms and toe systolic pressure readings are included as required and additional duplex testing as needed. Limited examinations for reoccurring indications may be performed as noted.  ABI Findings: +---------+------------------+-----+-----------+--------+ Right    Rt Pressure (mmHg)IndexWaveform   Comment  +---------+------------------+-----+-----------+--------+ Brachial 93                                          +---------+------------------+-----+-----------+--------+  PTA      107               1.11 multiphasic         +---------+------------------+-----+-----------+--------+ PERO     108               1.12 multiphasic         +---------+------------------+-----+-----------+--------+ DP       111               1.16 monophasic          +---------+------------------+-----+-----------+--------+ Great Toe70                0.73 Normal              +---------+------------------+-----+-----------+--------+ +---------+------------------+-----+----------+-------+ Left     Lt Pressure (mmHg)IndexWaveform  Comment +---------+------------------+-----+----------+-------+ Brachial 96                                       +---------+------------------+-----+----------+-------+ PTA      55                0.57 monophasic        +---------+------------------+-----+----------+-------+ PERO                            absent            +---------+------------------+-----+----------+-------+ DP       32                0.33 monophasic        +---------+------------------+-----+----------+-------+ Great Toe28                0.29 Abnormal          +---------+------------------+-----+----------+-------+ +-------+-----------+-----------+------------+------------+ ABI/TBIToday's ABIToday's TBIPrevious ABIPrevious TBI +-------+-----------+-----------+------------+------------+ Right  1.16       0.73       1.24        0.89         +-------+-----------+-----------+------------+------------+ Left   0.57       0.29       1.20        0.79         +-------+-----------+-----------+------------+------------+ Left ABIs appear decreased compared to prior study on 07/13/20. Right ABIs appear essentially unchanged compared to prior study on 07/13/20.  Summary: Right: Resting right ankle-brachial index is within normal range. The right toe-brachial index is normal. Left: Resting left  ankle-brachial index indicates moderate left lower extremity arterial disease. The left toe-brachial index is abnormal. Guidlines:  Patients with an ABI >1.2 or <0.9 have a high risk of peripheral atherosclerosis. Patients with asymptomatic peripheral atherosclerosis can be managed without further referral, at the discretion of the ordering provider. Guidelines for medical therapy include the following: - Patients with ABI evidence of peripheral atherosclerosis and symptoms of ischemic rest pain, with or without lower extremities wounds should be referred to a vascular specialist for urgent evaluation. The following therapies are recommendations from the Society of Vascular Surgery for patients with peripheral arterial disease: _ Multidisciplinary comprehensive smoking cessation interventions. _ Blood glucose control with goal A1c < 7%. _ Blood pressure control, < 140/90 mmHg. _ Statin therapy, goal LDL-C at least <100 mg/dL. _ Supervised exercise program (30-60 min of walking, three times a week) for patients with intermittent claudication. _ Aspirin 81mg  by mouth daily  *See table(s) above for measurements and  observations.  Electronically signed by Charlton Haws MD on 09/16/2022 at 4:43:36 PM.    Final     Microbiology: Recent Results (from the past 240 hour(s))  Resp panel by RT-PCR (RSV, Flu A&B, Covid) Anterior Nasal Swab     Status: None   Collection Time: 10/07/22 10:38 AM   Specimen: Anterior Nasal Swab  Result Value Ref Range Status   SARS Coronavirus 2 by RT PCR NEGATIVE NEGATIVE Final   Influenza A by PCR NEGATIVE NEGATIVE Final   Influenza B by PCR NEGATIVE NEGATIVE Final    Comment: (NOTE) The Xpert Xpress SARS-CoV-2/FLU/RSV plus assay is intended as an aid in the diagnosis of influenza from Nasopharyngeal swab specimens and should not be used as a sole basis for treatment. Nasal washings and aspirates are unacceptable for Xpert Xpress SARS-CoV-2/FLU/RSV testing.  Fact Sheet for  Patients: BloggerCourse.com  Fact Sheet for Healthcare Providers: SeriousBroker.it  This test is not yet approved or cleared by the [place] FDA and has been authorized for detection and/or diagnosis of SARS-CoV-2 by FDA under an Emergency Use Authorization (EUA). This EUA will remain in effect (meaning this test can be used) for the duration of the COVID-19 declaration under Section 564(b)(1) of the Act, 21 U.S.C. section 360bbb-3(b)(1), unless the authorization is terminated or revoked.     Resp Syncytial Virus by PCR NEGATIVE NEGATIVE Final    Comment: (NOTE) Fact Sheet for Patients: BloggerCourse.com  Fact Sheet for Healthcare Providers: SeriousBroker.it  This test is not yet approved or cleared by the [place] FDA and has been authorized for detection and/or diagnosis of SARS-CoV-2 by FDA under an Emergency Use Authorization (EUA). This EUA will remain in effect (meaning this test can be used) for the duration of the COVID-19 declaration under Section 564(b)(1) of the Act, 21 U.S.C. section 360bbb-3(b)(1), unless the authorization is terminated or revoked.  Performed at [facility], [address] [place], [place] [address]   Culture, body fluid w Gram Stain-bottle     Status: None   Collection Time: 10/09/22 10:22 AM   Specimen: Path fluid  Result Value Ref Range Status   Specimen Description FLUID  Final   Special Requests NONE  Final   Culture   Final    NO GROWTH 5 DAYS Performed at [facility], [address] [place], [place] [address]    Report Status 10/14/2022 FINAL  Final  Gram stain     Status: None   Collection Time: 10/09/22 10:22 AM   Specimen: Path fluid  Result Value Ref Range Status   Specimen Description FLUID  Final   Special Requests NONE  Final   Gram Stain   Final    FEW WBC PRESENT,BOTH PMN AND MONONUCLEAR NO  ORGANISMS SEEN Performed at [facility], [address], [place], [place] [address]    Report Status 10/09/2022 FINAL  Final     Labs: Basic Metabolic Panel: Recent Labs  Lab 10/10/22 0051 10/11/22 0141 10/12/22 0433 10/13/22 0120 10/14/22 0126  NA 136 136 136 135 138  K 4.7 4.4 3.7 4.1 4.7  CL 102 99 104 104 104  CO2 24 24 22 22 23   GLUCOSE 161* 194* 115* 191* 142*  BUN 30* 36* 35* 38* 38*  CREATININE 2.26* 2.35* 1.98* 2.11* 2.00*  CALCIUM 8.1* 7.9* 7.6* 7.3* 7.4*  MG 1.9  --   --  2.1 2.1  PHOS  --  3.6 3.4 3.4  --  Liver Function Tests: Recent Labs  Lab 10/09/22 0043 10/11/22 0141 10/12/22 0433 10/13/22 0120  AST 14*  --   --   --   ALT 8  --   --   --   ALKPHOS 66  --   --   --   BILITOT 0.8  --   --   --   PROT 5.6*  --   --   --   ALBUMIN 2.6* 2.1* 1.7* 2.1*   No results for input(s): "LIPASE", "AMYLASE" in the last 168 hours. No results for input(s): "AMMONIA" in the last 168 hours. CBC: Recent Labs  Lab 10/11/22 0141 10/12/22 0433 10/13/22 0120 10/14/22 0126 10/15/22 0030  WBC 9.4 9.0 8.9 8.7 8.7  HGB 11.0* 10.8* 11.0* 10.3* 10.9*  HCT 33.2* 32.0* 33.2* 30.9* 33.2*  MCV 74.9* 74.1* 76.0* 74.6* 76.3*  PLT 166 148* 145* 135* 173   Cardiac Enzymes: No results for input(s): "CKTOTAL", "CKMB", "CKMBINDEX", "TROPONINI" in the last 168 hours. BNP: BNP (last 3 results) Recent Labs    07/22/22 1557 07/25/22 0627 10/07/22 1110  BNP 326.5* 535.3* 352.0*    ProBNP (last 3 results) No results for input(s): "PROBNP" in the last 8760 hours.  CBG: Recent Labs  Lab 10/14/22 0448 10/14/22 1126 10/14/22 1639 10/14/22 2352 10/15/22 0556  GLUCAP 133* 179* 146* 214* 86       Signed:  Zannie Cove MD.  [facility] 10/15/2022, 9:47 AM

## 2022-10-14 NOTE — Plan of Care (Signed)
  Problem: Fluid Volume: Goal: Ability to maintain a balanced intake and output will improve Outcome: Progressing   Problem: Metabolic: Goal: Ability to maintain appropriate glucose levels will improve Outcome: Progressing   Problem: Nutritional: Goal: Maintenance of adequate nutrition will improve Outcome: Progressing   Problem: Skin Integrity: Goal: Risk for impaired skin integrity will decrease Outcome: Progressing   

## 2022-10-14 NOTE — Evaluation (Signed)
Occupational Therapy Evaluation Patient Details Name: [name] MRN: [id number] DOB: July 26, 1923 Today's Date: 10/14/2022   History of Present Illness 89 yo male admitted 5/20 with SOb and pericardial effusion. 5/22 pericardiocentesis. PMhx: dementia, HTN, CHF, Afib, PPM, CVA, PAD, CKD, T2DM   Clinical Impression   Pt reports needing assist at baseline with ADLs and uses RW for mobility, has 24/7 assist at home between caregivers and family. Pt currently needing set up - max A for ADLs, min A for transfers with RW. VSS on RA throughout session, pt presenting with impairments listed below, will follow acutely. Recommend HHOT at d/c given pt has level of assist needed at home.      Recommendations for follow up therapy are one component of a multi-disciplinary discharge planning process, led by the attending physician.  Recommendations may be updated based on patient status, additional functional criteria and insurance authorization.   Assistance Recommended at Discharge Frequent or constant Supervision/Assistance  Patient can return home with the following A little help with walking and/or transfers;A lot of help with bathing/dressing/bathroom;Assistance with cooking/housework;Direct supervision/assist for medications management;Direct supervision/assist for financial management;Assist for transportation;Help with stairs or ramp for entrance    Functional Status Assessment  Patient has had a recent decline in their functional status and demonstrates the ability to make significant improvements in function in a reasonable and predictable amount of time.  Equipment Recommendations  None recommended by OT    Recommendations for Other Services PT consult     Precautions / Restrictions Precautions Precautions: Fall Precaution Comments: HOH, talk into R ear Restrictions Weight Bearing Restrictions: No      Mobility Bed Mobility               General bed mobility  comments: received and left in recliner    Transfers Overall transfer level: Needs assistance Equipment used: Rolling walker (2 wheels) Transfers: Sit to/from Stand Sit to Stand: Min assist                  Balance Overall balance assessment: Needs assistance Sitting-balance support: No upper extremity supported, Feet supported Sitting balance-Leahy Scale: Fair     Standing balance support: Bilateral upper extremity supported, Reliant on assistive device for balance Standing balance-Leahy Scale: Poor Standing balance comment: RW in standing                           ADL either performed or assessed with clinical judgement   ADL Overall ADL's : Needs assistance/impaired Eating/Feeding: Set up;Sitting   Grooming: Set up;Sitting   Upper Body Bathing: Moderate assistance   Lower Body Bathing: Maximal assistance   Upper Body Dressing : Moderate assistance   Lower Body Dressing: Maximal assistance   Toilet Transfer: Minimal assistance;Ambulation;Regular Toilet;Rolling walker (2 wheels)   Toileting- Clothing Manipulation and Hygiene: Moderate assistance       Functional mobility during ADLs: Minimal assistance;Rolling walker (2 wheels)       Vision   Vision Assessment?: No apparent visual deficits     Perception Perception Perception Tested?: No   Praxis Praxis Praxis tested?: Not tested    Pertinent Vitals/Pain Pain Assessment Pain Assessment: No/denies pain     Hand Dominance Right   Extremity/Trunk Assessment Upper Extremity Assessment Upper Extremity Assessment: Generalized weakness   Lower Extremity Assessment Lower Extremity Assessment: Defer to PT evaluation   Cervical / Trunk Assessment Cervical / Trunk Assessment: Kyphotic   Communication Communication Communication: [facility]  Cognition Arousal/Alertness: Awake/alert Behavior During Therapy: Flat affect Overall Cognitive Status: History of cognitive impairments - at  baseline                                 General Comments: A  & O x4, baseline cognitive deficits     General Comments  VSS on RA    Exercises     Shoulder Instructions      Home Living Family/patient expects to be discharged to:: Private residence Living Arrangements: Non-relatives/Friends Available Help at Discharge: Personal care attendant;Available 24 hours/day;Family (reports between family and caregiver pt has 24/7 assist) Type of Home: House Home Access: Ramped entrance     Home Layout: One level     Bathroom Shower/Tub: Chief Strategy Officer: Standard     Home Equipment: Cane - single point;Rolling Walker (2 wheels);BSC/3in1;Wheelchair - manual;Hospital bed   Additional Comments: information obtained from recent evaluation on 5/24.      Prior Functioning/Environment Prior Level of Function : Needs assist             Mobility Comments: RW at all times, min assist for transfers ADLs Comments: Pt requires assist for ADL's at baseline level, has 24 hour care with nursing        OT Problem List: Decreased strength;Decreased range of motion;Decreased activity tolerance;Impaired balance (sitting and/or standing);Decreased cognition;Decreased safety awareness      OT Treatment/Interventions: Self-care/ADL training;Therapeutic exercise;Energy conservation;DME and/or AE instruction;Therapeutic activities;Patient/family education;Balance training    OT Goals(Current goals can be found in the care plan section) Acute Rehab OT Goals Patient Stated Goal: none stated OT Goal Formulation: With patient Time For Goal Achievement: 10/28/22 Potential to Achieve Goals: Good ADL Goals Pt Will Perform Upper Body Dressing: with min assist;sitting Pt Will Perform Lower Body Dressing: with min assist;sitting/lateral leans;sit to/from stand Pt Will Transfer to Toilet: with supervision;ambulating;regular height toilet Additional ADL Goal #1: pt will  be able stand x5 min for task in order to improve activity tolerance for ADLs  OT Frequency: Min 1X/week    Co-evaluation              AM-PAC OT "6 Clicks" Daily Activity     Outcome Measure Help from another person eating meals?: None Help from another person taking care of personal grooming?: A Little Help from another person toileting, which includes using toliet, bedpan, or urinal?: A Lot Help from another person bathing (including washing, rinsing, drying)?: A Lot Help from another person to put on and taking off regular upper body clothing?: A Lot Help from another person to put on and taking off regular lower body clothing?: A Lot 6 Click Score: 15   End of Session Equipment Utilized During Treatment: Gait belt;Rolling walker (2 wheels) Nurse Communication: Mobility status  Activity Tolerance: Patient tolerated treatment well Patient left: in chair;with chair alarm set;with call bell/phone within reach;with family/visitor present  OT Visit Diagnosis: Unsteadiness on feet (R26.81);Other abnormalities of gait and mobility (R26.89);Muscle weakness (generalized) (M62.81)                Time: 1610-9604 OT Time Calculation (min): 19 min Charges:  OT General Charges $OT Visit: 1 Visit OT Evaluation $OT Eval Moderate Complexity: 1 Mod  Delonta Yohannes K, OTD, OTR/L SecureChat Preferred Acute Rehab [phone]   Jayleah Garbers K Koonce 10/14/2022, 1:48 PM

## 2022-10-14 NOTE — Progress Notes (Signed)
As per Dr. Ludwig Clarks request, 2 week f/u scheduled 6/11 with APP, also sent msg to scheduling team to arrange 4-6 week limited echo to f/u pericardial effusion - office will call him with this information. Pt also had pre-existing f/u 7/9 with Dr. Kirke Corin - have left appt for now, can be decided at f/u with D. Wittenborn whether they need to keep. Appt info outlined on AVS.

## 2022-10-14 NOTE — Progress Notes (Signed)
Mobility Specialist Progress Note:    10/14/22 1103  Mobility  Activity Ambulated with assistance in room  Level of Assistance Minimal assist, patient does 75% or more  Assistive Device Front wheel walker  Distance Ambulated (ft) 20 ft  Activity Response Tolerated well  Mobility Referral Yes  $Mobility charge 1 Mobility  Mobility Specialist Start Time (ACUTE ONLY) 1043  Mobility Specialist Stop Time (ACUTE ONLY) 1053  Mobility Specialist Time Calculation (min) (ACUTE ONLY) 10 min   Pt received in chair, agreeable to ambulate. No c/o throughout. Pt needed intermittent verbal cues to redirect attention. Pt assisted back to chair w/ call bell at hand and chair alarm on.   Thompson Grayer Mobility Specialist  Please contact vis Secure Chat or  Rehab Office [phone]

## 2022-10-14 NOTE — Consult Note (Signed)
WOC Nurse Consult Note: Reason for Consult:Left great toe with purple/back discoloration. Patient follows with Podiatry in the community for this digit. Dr. Lilian Kapur. Wound type: trauma vs infection vs perfusion Pressure Injury POA:N/A Drainage (amount, consistency, odor)  Dressing procedure/placement/frequency: I will continue the POC in place from Dr. Lilian Kapur and have nursing paint the digit daily with povidone-iodine solution and allow to dry. A daily dressing is to be placed and secured with conform bandaging/paper tape.  If further input is desired during this inpatient stay, consider Podiatric Medicine consult. Otherwise, patient to follow with Podiatric Medicine at the next scheduled appointment.  WOC nursing team will not follow, but will remain available to this patient, the nursing and medical teams.  Please re-consult if needed.  Thank you for inviting [place] to participate in this patient's Plan of Care.  Ladona Mow, MSN, RN, CNS, GNP, Leda Min, [employer], [employer] phone:  [phone]

## 2022-10-14 NOTE — Evaluation (Signed)
Physical Therapy Evaluation Patient Details Name: [name] MRN: [id number] DOB: [birth year] Today's Date: 10/14/2022  History of Present Illness  89 yo male admitted 5/20 with SOb and pericardial effusion. 5/22 pericardiocentesis. PMhx: dementia, HTN, CHF, Afib, PPM, CVA, PAD, CKD, T2DM  Clinical Impression  Pt presents to PT with deficits in strength, power, endurance, balance, gait, although based on prior level of function provided in PT evaluation on 10/11/2022 the pt does not appear to be far from his baseline. Pt is able to ambulate for household distances with walker, and continues to require some assistance for transfers. If pt continues to have 24/7 caregiver support, as was reported on 5/24, then PT recommends a return home. Pt will benefit from HHPT in an effort to progress activity tolerance. If caregiver availability has changed and the patient does not have assistance for all functional mobility then placement may be necessary, however if continued 24/7 support is available the pt will likely be able to mobilize more frequently at home than in an inpatient setting.       Recommendations for follow up therapy are one component of a multi-disciplinary discharge planning process, led by the attending physician.  Recommendations may be updated based on patient status, additional functional criteria and insurance authorization.  Follow Up Recommendations       Assistance Recommended at Discharge Intermittent Supervision/Assistance  Patient can return home with the following  A little help with walking and/or transfers;A lot of help with bathing/dressing/bathroom;Assistance with cooking/housework;Direct supervision/assist for medications management;Direct supervision/assist for financial management;Assist for transportation;Help with stairs or ramp for entrance    Equipment Recommendations None recommended by PT  Recommendations for Other Services       Functional Status  Assessment Patient has had a recent decline in their functional status and demonstrates the ability to make significant improvements in function in a reasonable and predictable amount of time. (but does not appear far from baseline)     Precautions / Restrictions Precautions Precautions: Fall Restrictions Weight Bearing Restrictions: No      Mobility  Bed Mobility               General bed mobility comments: received and left in recliner    Transfers Overall transfer level: Needs assistance Equipment used: Rolling walker (2 wheels) Transfers: Sit to/from Stand Sit to Stand: Min assist           General transfer comment: posterior lean, assist to power up    Ambulation/Gait Ambulation/Gait assistance: Min guard Gait Distance (Feet): 80 Feet Assistive device: Rolling walker (2 wheels) Gait Pattern/deviations: Step-through pattern, Decreased stride length Gait velocity: reduced Gait velocity interpretation: <1.8 ft/sec, indicate of risk for recurrent falls   General Gait Details: slowed step-through gait  Stairs            Wheelchair Mobility    Modified Rankin (Stroke Patients Only)       Balance Overall balance assessment: Needs assistance Sitting-balance support: No upper extremity supported, Feet supported Sitting balance-Leahy Scale: Fair     Standing balance support: Bilateral upper extremity supported, Reliant on assistive device for balance Standing balance-Leahy Scale: Poor                               Pertinent Vitals/Pain Pain Assessment Pain Assessment: No/denies pain    Home Living Family/patient expects to be discharged to:: Private residence Living Arrangements: Non-relatives/Friends Available Help at Discharge: Personal care attendant;Available 24  hours/day Type of Home: House Home Access: Ramped entrance       Home Layout: One level Home Equipment: Cane - single point;Rolling Walker (2  wheels);BSC/3in1;Wheelchair - manual;Hospital bed Additional Comments: information obtained from recent evaluation on 5/24.    Prior Function Prior Level of Function : Needs assist             Mobility Comments: RW at all times, min assist for transfers ADLs Comments: Pt requires assist for ADL's at baseline level, has 24 hour care with nursing     Hand Dominance   Dominant Hand: Right    Extremity/Trunk Assessment   Upper Extremity Assessment Upper Extremity Assessment: Generalized weakness    Lower Extremity Assessment Lower Extremity Assessment: Generalized weakness    Cervical / Trunk Assessment Cervical / Trunk Assessment: Kyphotic  Communication   Communication: HOH  Cognition Arousal/Alertness: Awake/alert Behavior During Therapy: Flat affect Overall Cognitive Status: History of cognitive impairments - at baseline                                          General Comments General comments (skin integrity, edema, etc.): VSS on RA, mild tachycardia into low 100s    Exercises     Assessment/Plan    PT Assessment Patient needs continued PT services  PT Problem List Decreased strength;Decreased activity tolerance;Decreased balance;Decreased mobility;Decreased knowledge of use of DME;Cardiopulmonary status limiting activity       PT Treatment Interventions DME instruction;Gait training;Functional mobility training;Therapeutic activities;Therapeutic exercise;Balance training;Neuromuscular re-education;Patient/family education    PT Goals (Current goals can be found in the Care Plan section)  Acute Rehab PT Goals Patient Stated Goal: to restore prior level of function PT Goal Formulation: With patient Time For Goal Achievement: 10/28/22 Potential to Achieve Goals: Good    Frequency Min 2X/week     Co-evaluation               AM-PAC PT "6 Clicks" Mobility  Outcome Measure Help needed turning from your back to your side while in a  flat bed without using bedrails?: A Little Help needed moving from lying on your back to sitting on the side of a flat bed without using bedrails?: A Little Help needed moving to and from a bed to a chair (including a wheelchair)?: A Little Help needed standing up from a chair using your arms (e.g., wheelchair or bedside chair)?: A Little Help needed to walk in hospital room?: A Little Help needed climbing 3-5 steps with a railing? : A Lot 6 Click Score: 17    End of Session Equipment Utilized During Treatment: Gait belt Activity Tolerance: Patient tolerated treatment well Patient left: in chair;with call bell/phone within reach;with chair alarm set Nurse Communication: Mobility status PT Visit Diagnosis: Muscle weakness (generalized) (M62.81)    Time: 1478-2956 PT Time Calculation (min) (ACUTE ONLY): 16 min   Charges:   PT Evaluation $PT Eval Low Complexity: 1 Low          Arlyss Gandy, PT, DPT Acute Rehabilitation Office [phone]   Arlyss Gandy 10/14/2022, 11:32 AM

## 2022-10-15 ENCOUNTER — Telehealth (HOSPITAL_COMMUNITY): Payer: Self-pay | Admitting: Cardiology

## 2022-10-15 DIAGNOSIS — I3139 Other pericardial effusion (noninflammatory): Secondary | ICD-10-CM | POA: Diagnosis not present

## 2022-10-15 DIAGNOSIS — I5033 Acute on chronic diastolic (congestive) heart failure: Secondary | ICD-10-CM | POA: Diagnosis not present

## 2022-10-15 LAB — CBC
HCT: 33.2 % — ABNORMAL LOW (ref 39.0–52.0)
Hemoglobin: 10.9 g/dL — ABNORMAL LOW (ref 13.0–17.0)
MCH: 25.1 pg — ABNORMAL LOW (ref 26.0–34.0)
MCHC: 32.8 g/dL (ref 30.0–36.0)
MCV: 76.3 fL — ABNORMAL LOW (ref 80.0–100.0)
Platelets: 173 10*3/uL (ref 150–400)
RBC: 4.35 MIL/uL (ref 4.22–5.81)
RDW: 17.5 % — ABNORMAL HIGH (ref 11.5–15.5)
WBC: 8.7 10*3/uL (ref 4.0–10.5)
nRBC: 0 % (ref 0.0–0.2)

## 2022-10-15 LAB — GLUCOSE, CAPILLARY
Glucose-Capillary: 86 mg/dL (ref 70–99)
Glucose-Capillary: 146 mg/dL — ABNORMAL HIGH (ref 70–99)
Glucose-Capillary: 109 mg/dL — ABNORMAL HIGH (ref 70–99)
Glucose-Capillary: 229 mg/dL — ABNORMAL HIGH (ref 70–99)

## 2022-10-15 MED ORDER — MIDODRINE HCL 5 MG PO TABS: 10.0000 mg | ORAL_TABLET | Freq: Two times a day (BID) | ORAL | Status: DC

## 2022-10-15 MED ORDER — ALBUMIN HUMAN 5 % IV SOLN
25.0000 g | Freq: Once | INTRAVENOUS | Status: AC
  Administered 2022-10-15: 25 g via INTRAVENOUS
  Filled 2022-10-15: qty 500

## 2022-10-15 MED ORDER — MIDODRINE HCL 5 MG PO TABS
5.0000 mg | ORAL_TABLET | Freq: Once | ORAL | Status: AC
  Administered 2022-10-15: 5 mg via ORAL
  Filled 2022-10-15: qty 1

## 2022-10-15 NOTE — Significant Event (Signed)
Pt blood pressures are low and MD has cancelled Discharge for today.

## 2022-10-15 NOTE — Progress Notes (Signed)
Mobility Specialist Progress Note:    10/15/22 1022  Mobility  Activity Ambulated with assistance in room  Level of Assistance Minimal assist, patient does 75% or more  Assistive Device Front wheel walker  Distance Ambulated (ft) 20 ft  Activity Response Tolerated well  Mobility Referral Yes  $Mobility charge 1 Mobility  Mobility Specialist Start Time (ACUTE ONLY) 0955  Mobility Specialist Stop Time (ACUTE ONLY) 1006  Mobility Specialist Time Calculation (min) (ACUTE ONLY) 11 min   Pt received in chair, agreeable to ambulate. Pt needed a lot of verbal cues to redirect. No c/o throughout. Pt assisted back to chair with call light at hand.   Thompson Grayer Mobility Specialist  Please contact vis Secure Chat or  Rehab Office [phone]

## 2022-10-15 NOTE — Progress Notes (Addendum)
PROGRESS NOTE    [name]  ZOX:[id number] DOB: 03-Nov-1923 DOA: 10/07/2022 PCP: Mliss Sax, MD  89 y.o. male with medical history significant of hypertension, heart failure with preserved EF, atrial fibrillation, tachybradycardia s/p pacemaker, CVA, peripheral arterial disease, CKD stage IV, diabetes mellitus type 2 who presents with complaints of shortness of breath.  -In the ED noted to be volume overloaded -Labs significant for WBC 7, hemoglobin 9.6, BUN 28, creatinine 2.3, ionized calcium 1.11, and BNP 352.    -Chest radiograph with cardiomegaly, bilateral hilar vascular congestion, bilateral pleural effusions more left than right. Pacemaker in place with one lead in the right atrium and one in the right ventricle.   Subjective:  -Feels okay, denies any complaints -DC was completed yesterday with plans for discharge home with home health since declined for SNF -Addendum: this afternoon BP more soft in 80s   Assessment and Plan:  Acute on chronic diastolic CHF (congestive heart failure) ([name]) -Echocardiogram with preserved LV systolic function with EF 50 to 55%, severe LVH, RVSP 38.9. LA with severe dilatation. Large pericardial effusion.  -Briefly diuresed with IV Lasix, treated for pericardial effusion as noted below, now changed to oral Lasix 40 Mg daily, follow-up with cardiology on 6/11 -Hold dose of Lasix tomorrow with soft blood pressures -Continue midodrine, dose increased   Pericardial effusion  -S/p pericardiocentesis on 5/22- 1030 cc fluid removed, serosanguinous and somewhat dark.  -Follow-up echocardiogram showed much improved effusion.  Cardiology recommended repeat echocardiogram in 4 to 6 weeks. -Catheter has been removed on 10/11/2022 -Cleared for discharge from a cardiology standpoint, follow-up arranged with repeat echo    CKD (chronic kidney disease), stage IV ([name]) -Evaluated by nephrology.  Creatinine is improving and around his  baseline.  Nephrology signed off. -Baseline creatinine around 2   Anemia of chronic renal disease.  -Stable hgb   Atrial fibrillation ([name]) -Persistent atrial fibrillation.  -Patient has diagnosis of tachy brady syndrome and had a pacemaker in place.  -Continue Eliquis.  Started on low-dose metoprolol 12.5 mg twice daily   Hypotension -Continue midodrine, dose increased   Type 2 diabetes mellitus without complication, without long-term current use of insulin ([name]) -Diet controlled -A1c 7.2%.   Alzheimer disease ([name]) -No agitation, continue with memantine and depakote.    History of CVA (cerebrovascular accident) -At baseline   Benign prostatic hyperplasia with urinary obstruction -On proscar and flomax.    Gangrene of toe of left foot ([name]) Peripheral vascular disease: -Followed by podiatry outpatient.  After ABI of left leg were noted be abnormal and was sent to Dr. Kirke Corin.  Surgical intervention was recommended but family was waiting on clearance from his other providers.  He had similar surgery in 2018. -Recommend to follow-up outpatient   Constipation: -Improved, continue MiraLAX and Colace   BPH: Continue Flomax   GERD: Continue PPI   Thrombocytopenia: Mild     Code Status: DNR, outpatient palliative care referral sent due to advanced age, frailty dementia and CHF    DVT prophylaxis: Apixaban Family Communication: No family at bedside today Disposition Plan: With home health services likely tomorrow  Consultants:    Procedures:   Antimicrobials:    Objective: Vitals:   10/15/22 1115 10/15/22 1130 10/15/22 1134 10/15/22 1154  BP: (!) 78/49 (!) 78/49 (!) 81/63 (!) 85/55  Pulse: 86 (!) 56 (!) 101 88  Resp:  18    Temp: 97.9 F (36.6 C) 97.9 F (36.6 C)    TempSrc:  SpO2: 100% 100% 100% 100%  Weight:      Height:        Intake/Output Summary (Last 24 hours) at 10/15/2022 1347 Last data filed at 10/15/2022 1328 Gross per 24 hour  Intake 60  ml  Output 401 ml  Net -341 ml   Filed Weights   10/13/22 0419 10/14/22 0432 10/15/22 0500  Weight: 61.3 kg 63.4 kg 62.7 kg    Examination:  General exam: Appears calm and comfortable, AAOx2, very hard of hearing Respiratory system: Clear to auscultation Cardiovascular system: S1 & S2 heard, RRR.  Abd: nondistended, soft and nontender.Normal bowel sounds heard. Central nervous system: Alert and oriented. No focal neurological deficits. Extremities: no edema Skin: No rashes Psychiatry:  Mood & affect appropriate.     Data Reviewed:   CBC: Recent Labs  Lab 10/11/22 0141 10/12/22 0433 10/13/22 0120 10/14/22 0126 10/15/22 0030  WBC 9.4 9.0 8.9 8.7 8.7  HGB 11.0* 10.8* 11.0* 10.3* 10.9*  HCT 33.2* 32.0* 33.2* 30.9* 33.2*  MCV 74.9* 74.1* 76.0* 74.6* 76.3*  PLT 166 148* 145* 135* 173   Basic Metabolic Panel: Recent Labs  Lab 10/10/22 0051 10/11/22 0141 10/12/22 0433 10/13/22 0120 10/14/22 0126  NA 136 136 136 135 138  K 4.7 4.4 3.7 4.1 4.7  CL 102 99 104 104 104  CO2 24 24 22 22 23   GLUCOSE 161* 194* 115* 191* 142*  BUN 30* 36* 35* 38* 38*  CREATININE 2.26* 2.35* 1.98* 2.11* 2.00*  CALCIUM 8.1* 7.9* 7.6* 7.3* 7.4*  MG 1.9  --   --  2.1 2.1  PHOS  --  3.6 3.4 3.4  --    GFR: Estimated Creatinine Clearance: 19.2 mL/min (A) (by C-G formula based on SCr of 2 mg/dL (H)). Liver Function Tests: Recent Labs  Lab 10/09/22 0043 10/11/22 0141 10/12/22 0433 10/13/22 0120  AST 14*  --   --   --   ALT 8  --   --   --   ALKPHOS 66  --   --   --   BILITOT 0.8  --   --   --   PROT 5.6*  --   --   --   ALBUMIN 2.6* 2.1* 1.7* 2.1*   No results for input(s): "LIPASE", "AMYLASE" in the last 168 hours. No results for input(s): "AMMONIA" in the last 168 hours. Coagulation Profile: No results for input(s): "INR", "PROTIME" in the last 168 hours. Cardiac Enzymes: No results for input(s): "CKTOTAL", "CKMB", "CKMBINDEX", "TROPONINI" in the last 168 hours. BNP (last 3  results) No results for input(s): "PROBNP" in the last 8760 hours. HbA1C: No results for input(s): "HGBA1C" in the last 72 hours. CBG: Recent Labs  Lab 10/14/22 1126 10/14/22 1639 10/14/22 2352 10/15/22 0556 10/15/22 1109  GLUCAP 179* 146* 214* 86 146*   Lipid Profile: No results for input(s): "CHOL", "HDL", "LDLCALC", "TRIG", "CHOLHDL", "LDLDIRECT" in the last 72 hours. Thyroid Function Tests: No results for input(s): "TSH", "T4TOTAL", "FREET4", "T3FREE", "THYROIDAB" in the last 72 hours. Anemia Panel: No results for input(s): "VITAMINB12", "FOLATE", "FERRITIN", "TIBC", "IRON", "RETICCTPCT" in the last 72 hours. Urine analysis:    Component Value Date/Time   COLORURINE YELLOW 07/22/2022 1708   APPEARANCEUR CLEAR 07/22/2022 1708   LABSPEC 1.011 07/22/2022 1708   PHURINE 5.0 07/22/2022 1708   GLUCOSEU NEGATIVE 07/22/2022 1708   GLUCOSEU NEGATIVE 10/30/2020 1051   HGBUR NEGATIVE 07/22/2022 1708   BILIRUBINUR NEGATIVE 07/22/2022 1708   BILIRUBINUR neg 08/03/2021 1702  KETONESUR NEGATIVE 07/22/2022 1708   PROTEINUR NEGATIVE 07/22/2022 1708   UROBILINOGEN 1.0 08/03/2021 1702   UROBILINOGEN 0.2 10/30/2020 1051   NITRITE NEGATIVE 07/22/2022 1708   LEUKOCYTESUR NEGATIVE 07/22/2022 1708   Sepsis Labs: @LABRCNTIP (procalcitonin:4,lacticidven:4)  ) Recent Results (from the past 240 hour(s))  Resp panel by RT-PCR (RSV, Flu A&B, Covid) Anterior Nasal Swab     Status: None   Collection Time: 10/07/22 10:38 AM   Specimen: Anterior Nasal Swab  Result Value Ref Range Status   SARS Coronavirus 2 by RT PCR NEGATIVE NEGATIVE Final   Influenza A by PCR NEGATIVE NEGATIVE Final   Influenza B by PCR NEGATIVE NEGATIVE Final    Comment: (NOTE) The Xpert Xpress SARS-CoV-2/FLU/RSV plus assay is intended as an aid in the diagnosis of influenza from Nasopharyngeal swab specimens and should not be used as a sole basis for treatment. Nasal washings and aspirates are unacceptable for Xpert  Xpress SARS-CoV-2/FLU/RSV testing.  Fact Sheet for Patients: BloggerCourse.com  Fact Sheet for Healthcare Providers: SeriousBroker.it  This test is not yet approved or cleared by the [place] FDA and has been authorized for detection and/or diagnosis of SARS-CoV-2 by FDA under an Emergency Use Authorization (EUA). This EUA will remain in effect (meaning this test can be used) for the duration of the COVID-19 declaration under Section 564(b)(1) of the Act, 21 U.S.C. section 360bbb-3(b)(1), unless the authorization is terminated or revoked.     Resp Syncytial Virus by PCR NEGATIVE NEGATIVE Final    Comment: (NOTE) Fact Sheet for Patients: BloggerCourse.com  Fact Sheet for Healthcare Providers: SeriousBroker.it  This test is not yet approved or cleared by the [place] FDA and has been authorized for detection and/or diagnosis of SARS-CoV-2 by FDA under an Emergency Use Authorization (EUA). This EUA will remain in effect (meaning this test can be used) for the duration of the COVID-19 declaration under Section 564(b)(1) of the Act, 21 U.S.C. section 360bbb-3(b)(1), unless the authorization is terminated or revoked.  Performed at [facility], [address] [place], [place] [address]   Culture, body fluid w Gram Stain-bottle     Status: None   Collection Time: 10/09/22 10:22 AM   Specimen: Path fluid  Result Value Ref Range Status   Specimen Description FLUID  Final   Special Requests NONE  Final   Culture   Final    NO GROWTH 5 DAYS Performed at [facility], [address], [place], [place] [address]    Report Status 10/14/2022 FINAL  Final  Gram stain     Status: None   Collection Time: 10/09/22 10:22 AM   Specimen: Path fluid  Result Value Ref Range Status   Specimen Description FLUID  Final   Special Requests NONE  Final   Gram Stain   Final     FEW WBC PRESENT,BOTH PMN AND MONONUCLEAR NO ORGANISMS SEEN Performed at [facility], [address] [place], [place] [address]    Report Status 10/09/2022 FINAL  Final     Radiology Studies: No results found.   Scheduled Meds:  apixaban  2.5 mg Oral BID   Chlorhexidine Gluconate Cloth  6 each Topical Daily   divalproex  125 mg Oral BID   docusate sodium  100 mg Oral BID   feeding supplement  237 mL Oral QHS   finasteride  5 mg Oral Daily   furosemide  40 mg Oral Daily   insulin aspart  0-5 Units Subcutaneous QHS  insulin aspart  0-6 Units Subcutaneous TID WC   latanoprost  1 drop Both Eyes QHS   melatonin  3 mg Oral QHS   memantine  10 mg Oral BID   metoprolol tartrate  12.5 mg Oral BID   pantoprazole  40 mg Oral Daily   polyethylene glycol  17 g Oral Daily   sodium chloride flush  3 mL Intravenous Q12H   tamsulosin  0.4 mg Oral Daily   Continuous Infusions:   LOS: 8 days    Time spent:    Zannie Cove, MD [facility]   10/15/2022, 1:47 PM

## 2022-10-15 NOTE — TOC Progression Note (Addendum)
Transition of Care [facility]) - Progression Note    Patient Details  Name: [name] MRN: [id number] Date of Birth: 16-Mar-1924  [facility]) CM/SW Contact  Leone Haven, RN Phone Number: 10/15/2022, 11:37 AM  Clinical Narrative:    NCM spoke with grand daughter, she states she did not know patient was being dc today, she is at work and will not get off til 3pm.  Patient has 24 hr aide services and [facility] , she will call this NCM back.  Patient is also set up with [facility] HHPT, HHOT, HHST,  Grand daughter also states she wanted him to go to a SNF.  NCM informed her that the rec's are for [facility].    1317- NCM spoke grand daughter again, per PT states patient is not appropriate for SNF ,he has 24 hr care at home.  Grand daughter states her spouse can transport him home and he will have an aide for second shift there with him.       Barriers to Discharge: Continued Medical Work up  Expected Discharge Plan and Services   Discharge Planning Services: CM Consult   Living arrangements for the past 2 months: Single Family Home Expected Discharge Date: 10/15/22                                     Social Determinants of Health (SDOH) Interventions SDOH Screenings   Food Insecurity: No Food Insecurity (10/09/2022)  Housing: Low Risk  (10/09/2022)  Transportation Needs: No Transportation Needs (10/09/2022)  Utilities: Not At Risk (10/09/2022)  Alcohol Screen: Low Risk  (08/19/2022)  Depression (PHQ2-9): Low Risk  (09/11/2022)  Financial Resource Strain: Low Risk  (08/19/2022)  Physical Activity: Sufficiently Active (08/19/2022)  Social Connections: Moderately Isolated (08/19/2022)  Stress: No Stress Concern Present (08/19/2022)  Tobacco Use: Medium Risk (10/09/2022)    Readmission Risk Interventions    10/13/2022    4:40 PM 08/08/2022    2:30 PM 07/08/2022   11:12 AM  Readmission Risk  Prevention Plan  Transportation Screening Complete Complete Complete  Medication Review Oceanographer) Complete Complete Complete  PCP or Specialist appointment within 3-5 days of discharge Complete Complete Complete  HRI or Home Care Consult Complete Complete Complete  SW Recovery Care/Counseling Consult Complete Complete Complete  Palliative Care Screening Not Applicable Complete Not Applicable  Skilled Nursing Facility Not Applicable Not Applicable Not Applicable

## 2022-10-15 NOTE — Progress Notes (Signed)
Occupational Therapy Treatment Patient Details Name: [name] MRN: [id number] DOB: August 07, 1923 Today's Date: 10/15/2022   History of present illness 89 yo male admitted 5/20 with SOb and pericardial effusion. 5/22 pericardiocentesis. PMhx: dementia, HTN, CHF, Afib, PPM, CVA, PAD, CKD, T2DM   OT comments  Pt progressing towards goals this session, able to stand for grooming task at sink, overall needing min guard-max A for ADLs, mod A for bed mobility, and min A for transfers with RW. Pt keeps knees slightly flexed with walking/standing, 1 mild instance of knee buckling but able to correct with min guard A and use of UE. Pt presenting with impairments listed below, will follow acutely. Continue to recommend HHOT at d/c pending progression.   Recommendations for follow up therapy are one component of a multi-disciplinary discharge planning process, led by the attending physician.  Recommendations may be updated based on patient status, additional functional criteria and insurance authorization.    Assistance Recommended at Discharge Frequent or constant Supervision/Assistance  Patient can return home with the following  A little help with walking and/or transfers;A lot of help with bathing/dressing/bathroom;Assistance with cooking/housework;Direct supervision/assist for medications management;Direct supervision/assist for financial management;Assist for transportation;Help with stairs or ramp for entrance   Equipment Recommendations  None recommended by OT    Recommendations for Other Services PT consult    Precautions / Restrictions Precautions Precautions: Fall Precaution Comments: HOH, talk into R ear Restrictions Weight Bearing Restrictions: No       Mobility Bed Mobility Overal bed mobility: Needs Assistance Bed Mobility: Supine to Sit     Supine to sit: Mod assist     General bed mobility comments: mod A for trunk elevation    Transfers Overall transfer  level: Needs assistance Equipment used: Rolling walker (2 wheels) Transfers: Sit to/from Stand Sit to Stand: Min assist           General transfer comment: from elevated bed height     Balance Overall balance assessment: Needs assistance Sitting-balance support: No upper extremity supported, Feet supported Sitting balance-Leahy Scale: Fair     Standing balance support: Bilateral upper extremity supported, Reliant on assistive device for balance Standing balance-Leahy Scale: Poor Standing balance comment: RW in standing                           ADL either performed or assessed with clinical judgement   ADL Overall ADL's : Needs assistance/impaired     Grooming: Min guard;Standing;Wash/dry face Grooming Details (indicate cue type and reason): stands to wash face at sink         Upper Body Dressing : Sitting;Minimal assistance   Lower Body Dressing: Maximal assistance Lower Body Dressing Details (indicate cue type and reason): can cross legs but unable to reach down far enough to don socks Toilet Transfer: Min guard;Ambulation;Rolling walker (2 wheels);Regular Teacher, adult education Details (indicate cue type and reason): simulated via functional mobility         Functional mobility during ADLs: Min guard;Rolling walker (2 wheels)      Extremity/Trunk Assessment Upper Extremity Assessment Upper Extremity Assessment: Generalized weakness   Lower Extremity Assessment Lower Extremity Assessment: Defer to PT evaluation        Vision   Vision Assessment?: No apparent visual deficits   Perception Perception Perception: Not tested   Praxis Praxis Praxis: Not tested    Cognition Arousal/Alertness: Awake/alert Behavior During Therapy: Flat affect Overall Cognitive Status: History of cognitive impairments -  at baseline                                 General Comments: A  & O x4, baseline cognitive deficits        Exercises       Shoulder Instructions       General Comments VSS on RA    Pertinent Vitals/ Pain       Pain Assessment Pain Assessment: No/denies pain  Home Living                                          Prior Functioning/Environment              Frequency  Min 1X/week        Progress Toward Goals  OT Goals(current goals can now be found in the care plan section)  Progress towards OT goals: Progressing toward goals  Acute Rehab OT Goals Patient Stated Goal: none stated OT Goal Formulation: With patient Time For Goal Achievement: 10/28/22 Potential to Achieve Goals: Good ADL Goals Pt Will Perform Upper Body Dressing: with min assist;sitting Pt Will Perform Lower Body Dressing: with min assist;sitting/lateral leans;sit to/from stand Pt Will Transfer to Toilet: with supervision;ambulating;regular height toilet Additional ADL Goal #1: pt will be able stand x5 min for task in order to improve activity tolerance for ADLs  Plan Discharge plan remains appropriate;Frequency remains appropriate    Co-evaluation                 AM-PAC OT "6 Clicks" Daily Activity     Outcome Measure   Help from another person eating meals?: None Help from another person taking care of personal grooming?: A Little Help from another person toileting, which includes using toliet, bedpan, or urinal?: A Lot Help from another person bathing (including washing, rinsing, drying)?: A Lot Help from another person to put on and taking off regular upper body clothing?: A Lot Help from another person to put on and taking off regular lower body clothing?: A Lot 6 Click Score: 15    End of Session Equipment Utilized During Treatment: Gait belt;Rolling walker (2 wheels)  OT Visit Diagnosis: Unsteadiness on feet (R26.81);Other abnormalities of gait and mobility (R26.89);Muscle weakness (generalized) (M62.81)   Activity Tolerance Patient tolerated treatment well   Patient Left in  chair;with chair alarm set;with call bell/phone within reach   Nurse Communication Mobility status;Other (comment) (confirmed pt can have more splenda in coffee)        Time: 4098-1191 OT Time Calculation (min): 28 min  Charges: OT General Charges $OT Visit: 1 Visit OT Treatments $Self Care/Home Management : 23-37 mins  Carver Fila, OTD, OTR/L SecureChat Preferred Acute Rehab [phone]   Dalphine Handing 10/15/2022, 8:38 AM

## 2022-10-15 NOTE — Plan of Care (Signed)
Discharging home with family and home health.

## 2022-10-15 NOTE — TOC Transition Note (Addendum)
Transition of Care [facility]) - CM/SW Discharge Note   Patient Details  Name: [name] MRN: [id number] Date of Birth: 1923/10/28  [facility]) CM/SW Contact:  Leone Haven, RN Phone Number: 10/15/2022, 1:19 PM   Clinical Narrative:    NCM spoke grand daughter again, per PT states patient is not appropriate for SNF ,he has 24 hr care at home.  Grand daughter states her spouse can transport him home and he will have an aide for second shift there with him.  NCM notifed Morrie Sheldon with [facility] regarding the dc today.  Granddaughter's spouse will transport. Patient had issue with his bp, was in the 70's so discharged canceled today.      Barriers to Discharge: Continued Medical Work up   Patient Goals and CMS Choice      Discharge Placement                         Discharge Plan and Services Additional resources added to the After Visit Summary for     Discharge Planning Services: CM Consult                                 Social Determinants of Health (SDOH) Interventions SDOH Screenings   Food Insecurity: No Food Insecurity (10/09/2022)  Housing: Low Risk  (10/09/2022)  Transportation Needs: No Transportation Needs (10/09/2022)  Utilities: Not At Risk (10/09/2022)  Alcohol Screen: Low Risk  (08/19/2022)  Depression (PHQ2-9): Low Risk  (09/11/2022)  Financial Resource Strain: Low Risk  (08/19/2022)  Physical Activity: Sufficiently Active (08/19/2022)  Social Connections: Moderately Isolated (08/19/2022)  Stress: No Stress Concern Present (08/19/2022)  Tobacco Use: Medium Risk (10/09/2022)     Readmission Risk Interventions    10/13/2022    4:40 PM 08/08/2022    2:30 PM 07/08/2022   11:12 AM  Readmission Risk Prevention Plan  Transportation Screening Complete Complete Complete  Medication Review Oceanographer) Complete Complete Complete  PCP or Specialist appointment within 3-5 days of discharge Complete Complete Complete  HRI or  Home Care Consult Complete Complete Complete  SW Recovery Care/Counseling Consult Complete Complete Complete  Palliative Care Screening Not Applicable Complete Not Applicable  Skilled Nursing Facility Not Applicable Not Applicable Not Applicable

## 2022-10-15 NOTE — Telephone Encounter (Signed)
I called to schedule patients ordered echocardiogram. Patient is currently in the hospital per granddaughter. She will call back after patient hopefully gets his mobility back. She will call us back to schedule. Order will be removed from the active echo wq and when they call back to reschedule we will reinstate the order. Thank you.

## 2022-10-16 ENCOUNTER — Telehealth: Payer: Self-pay

## 2022-10-16 ENCOUNTER — Other Ambulatory Visit: Payer: Self-pay | Admitting: Neurology

## 2022-10-16 DIAGNOSIS — F03911 Unspecified dementia, unspecified severity, with agitation: Secondary | ICD-10-CM

## 2022-10-16 LAB — BASIC METABOLIC PANEL
Anion gap: 12 (ref 5–15)
BUN: 44 mg/dL — ABNORMAL HIGH (ref 8–23)
CO2: 23 mmol/L (ref 22–32)
Calcium: 7.9 mg/dL — ABNORMAL LOW (ref 8.9–10.3)
Chloride: 104 mmol/L (ref 98–111)
Creatinine, Ser: 2.28 mg/dL — ABNORMAL HIGH (ref 0.61–1.24)
GFR, Estimated: 26 mL/min — ABNORMAL LOW (ref >60)
Glucose, Bld: 168 mg/dL — ABNORMAL HIGH (ref 70–99)
Potassium: 4.3 mmol/L (ref 3.5–5.1)
Sodium: 139 mmol/L (ref 135–145)

## 2022-10-16 LAB — CBC
HCT: 28.8 % — ABNORMAL LOW (ref 39.0–52.0)
Hemoglobin: 9.7 g/dL — ABNORMAL LOW (ref 13.0–17.0)
MCH: 25.3 pg — ABNORMAL LOW (ref 26.0–34.0)
MCHC: 33.7 g/dL (ref 30.0–36.0)
MCV: 75 fL — ABNORMAL LOW (ref 80.0–100.0)
Platelets: 166 10*3/uL (ref 150–400)
RBC: 3.84 MIL/uL — ABNORMAL LOW (ref 4.22–5.81)
RDW: 17.2 % — ABNORMAL HIGH (ref 11.5–15.5)
WBC: 7.1 10*3/uL (ref 4.0–10.5)
nRBC: 0 % (ref 0.0–0.2)

## 2022-10-16 LAB — GLUCOSE, CAPILLARY: Glucose-Capillary: 143 mg/dL — ABNORMAL HIGH (ref 70–99)

## 2022-10-16 MED ORDER — MIDODRINE HCL 5 MG PO TABS: 5.0000 mg | ORAL_TABLET | Freq: Three times a day (TID) | ORAL | Status: DC

## 2022-10-16 NOTE — Progress Notes (Signed)
Physical Therapy Treatment Patient Details Name: [name] MRN: [id number] DOB: 21-Mar-1924 Today's Date: 10/16/2022   History of Present Illness 89 yo male admitted 5/20 with SOb and pericardial effusion. 5/22 pericardiocentesis. PMhx: dementia, HTN, CHF, Afib, PPM, CVA, PAD, CKD, T2DM.    PT Comments    Pt received in chair, son present, pt agreeable to therapy session and with good participation and tolerance for transfer and gait training with RW. Son instructed on gait belt use and pt/family given gait belt for home as he needs increased assist to stand as he fatigues. BP more stable this date and HR WFL. Pt continues to benefit from PT services to progress toward functional mobility goals.    Recommendations for follow up therapy are one component of a multi-disciplinary discharge planning process, led by the attending physician.  Recommendations may be updated based on patient status, additional functional criteria and insurance authorization.  Follow Up Recommendations       Assistance Recommended at Discharge Intermittent Supervision/Assistance  Patient can return home with the following A little help with walking and/or transfers;A lot of help with bathing/dressing/bathroom;Assistance with cooking/housework;Direct supervision/assist for medications management;Direct supervision/assist for financial management;Assist for transportation;Help with stairs or ramp for entrance   Equipment Recommendations  None recommended by PT    Recommendations for Other Services       Precautions / Restrictions Precautions Precautions: Fall Precaution Comments: HOH, talk into R ear Restrictions Weight Bearing Restrictions: No     Mobility  Bed Mobility Overal bed mobility: Needs Assistance             General bed mobility comments: pt received in recliner    Transfers Overall transfer level: Needs assistance Equipment used: Rolling walker (2 wheels) Transfers: Sit  to/from Stand Sit to Stand: Min assist, Mod assist           General transfer comment: from chair height minA to stand initially, then modA second attempt; minA for stand>sit with cues for safer UE placement.    Ambulation/Gait Ambulation/Gait assistance: Min guard Gait Distance (Feet): 40 Feet (standing break in bathroom to urinate) Assistive device: Rolling walker (2 wheels) Gait Pattern/deviations: Step-through pattern, Decreased stride length Gait velocity: reduced     General Gait Details: slowed step-through gait, fair RW management, needs increased assist around obstacles due to bari RW which is larger/heavier than his typical device.   Stairs             Wheelchair Mobility    Modified Rankin (Stroke Patients Only)       Balance Overall balance assessment: Needs assistance Sitting-balance support: No upper extremity supported, Feet supported Sitting balance-Leahy Scale: Fair     Standing balance support: Bilateral upper extremity supported, Reliant on assistive device for balance Standing balance-Leahy Scale: Poor Standing balance comment: RW in standing                            Cognition Arousal/Alertness: Awake/alert Behavior During Therapy: Flat affect Overall Cognitive Status: History of cognitive impairments - at baseline                                 General Comments: A  & O x4, baseline cognitive deficits, son present and reports he appears close to baseline.        Exercises      General Comments General comments (skin integrity, edema,  etc.): BP 108/76 taken sitting in chair prior to transfers/gait; HR 80's bpm      Pertinent Vitals/Pain Pain Assessment Pain Assessment: No/denies pain    Home Living                          Prior Function            PT Goals (current goals can now be found in the care plan section) Acute Rehab PT Goals Patient Stated Goal: to restore prior level of  function PT Goal Formulation: With patient Time For Goal Achievement: 10/28/22 Progress towards PT goals: Progressing toward goals    Frequency    Min 2X/week      PT Plan Current plan remains appropriate    Co-evaluation              AM-PAC PT "6 Clicks" Mobility   Outcome Measure  Help needed turning from your back to your side while in a flat bed without using bedrails?: A Little Help needed moving from lying on your back to sitting on the side of a flat bed without using bedrails?: A Little Help needed moving to and from a bed to a chair (including a wheelchair)?: A Little Help needed standing up from a chair using your arms (e.g., wheelchair or bedside chair)?: A Lot (more assist as he fatigues) Help needed to walk in hospital room?: A Little Help needed climbing 3-5 steps with a railing? : A Lot 6 Click Score: 16    End of Session Equipment Utilized During Treatment: Gait belt Activity Tolerance: Patient tolerated treatment well Patient left: in chair;with call bell/phone within reach;with family/visitor present;with nursing/sitter in room;Other (comment) (son and RN in room to review DC paperwork) Nurse Communication: Mobility status PT Visit Diagnosis: Muscle weakness (generalized) (M62.81)     Time: 5284-1324 PT Time Calculation (min) (ACUTE ONLY): 31 min  Charges:  $Gait Training: 8-22 mins $Therapeutic Activity: 8-22 mins                     [name] P., PTA Acute Rehabilitation Services Secure Chat Preferred 9a-5:30pm Office: [phone]    Dorathy Kinsman [facility] 10/16/2022, 1:03 PM

## 2022-10-16 NOTE — Transitions of Care (Post Inpatient/ED Visit) (Signed)
   10/16/2022  Name: [name] MRN: [id number] DOB: 27-Jan-1924  Today's TOC FU Call Status: Today's TOC FU Call Status:: Unsuccessul Call (1st Attempt) Unsuccessful Call (1st Attempt) Date: 10/16/22  Attempted to reach the patient regarding the most recent Inpatient/ED visit.  Follow Up Plan: Additional outreach attempts will be made to reach the patient to complete the Transitions of Care (Post Inpatient/ED visit) call.   Alto Denver RN, MSN, CCM RN Care Manager  Chronic Care Management Direct Number: [phone]

## 2022-10-16 NOTE — Progress Notes (Addendum)
Patient is feeling well, no chest pain or dyspnea. This morning is out of bed to the chair.   BP 94/62   Pulse 95   Temp (!) 97.3 F (36.3 C) (Oral)   Resp 18   Ht 5\' 11"  (1.803 m)   Wt 62.2 kg   SpO2 96%   BMI 19.13 kg/m   Neurology somnolent but easy to arouse ENT with mild pallor Cardiovascular with S1 and S2 present, irregularly irregular with no gallops. No JVD No lower extremity edema Respiratory with no rales or wheezing, no rhonchi Abdomen with no distention   Acute on chronic decompensated heart failure.   Plan to discharge home with instructions to have close follow up as outpatient.   I spoke with patient's family at the bedside, we talked in detail about patient's condition, and plan of care. All questions were addressed.

## 2022-10-17 ENCOUNTER — Emergency Department (HOSPITAL_COMMUNITY): Payer: No Typology Code available for payment source

## 2022-10-17 ENCOUNTER — Encounter (HOSPITAL_COMMUNITY): Payer: Self-pay

## 2022-10-17 ENCOUNTER — Telehealth: Payer: Self-pay

## 2022-10-17 ENCOUNTER — Other Ambulatory Visit: Payer: Self-pay

## 2022-10-17 ENCOUNTER — Encounter: Payer: Self-pay | Admitting: Family Medicine

## 2022-10-17 ENCOUNTER — Inpatient Hospital Stay (HOSPITAL_COMMUNITY)
Admission: EM | Admit: 2022-10-17 | Discharge: 2022-10-27 | DRG: 603 | Disposition: A | Discharge status: Hospice | Payer: No Typology Code available for payment source | Attending: Internal Medicine | Admitting: Internal Medicine

## 2022-10-17 VITALS — BP 82/58 | HR 102 | Temp 98.4°F

## 2022-10-17 DIAGNOSIS — I70262 Atherosclerosis of native arteries of extremities with gangrene, left leg: Secondary | ICD-10-CM | POA: Diagnosis present

## 2022-10-17 DIAGNOSIS — S92402A Displaced unspecified fracture of left great toe, initial encounter for closed fracture: Secondary | ICD-10-CM | POA: Diagnosis present

## 2022-10-17 DIAGNOSIS — K56609 Unspecified intestinal obstruction, unspecified as to partial versus complete obstruction: Secondary | ICD-10-CM | POA: Diagnosis not present

## 2022-10-17 DIAGNOSIS — K56600 Partial intestinal obstruction, unspecified as to cause: Secondary | ICD-10-CM | POA: Diagnosis not present

## 2022-10-17 DIAGNOSIS — D631 Anemia in chronic kidney disease: Secondary | ICD-10-CM | POA: Diagnosis present

## 2022-10-17 DIAGNOSIS — Z862 Personal history of diseases of the blood and blood-forming organs and certain disorders involving the immune mechanism: Secondary | ICD-10-CM

## 2022-10-17 DIAGNOSIS — I130 Hypertensive heart and chronic kidney disease with heart failure and stage 1 through stage 4 chronic kidney disease, or unspecified chronic kidney disease: Secondary | ICD-10-CM | POA: Diagnosis present

## 2022-10-17 DIAGNOSIS — Z789 Other specified health status: Secondary | ICD-10-CM | POA: Diagnosis not present

## 2022-10-17 DIAGNOSIS — Z8673 Personal history of transient ischemic attack (TIA), and cerebral infarction without residual deficits: Secondary | ICD-10-CM

## 2022-10-17 DIAGNOSIS — I959 Hypotension, unspecified: Secondary | ICD-10-CM | POA: Diagnosis not present

## 2022-10-17 DIAGNOSIS — E1152 Type 2 diabetes mellitus with diabetic peripheral angiopathy with gangrene: Secondary | ICD-10-CM | POA: Diagnosis present

## 2022-10-17 DIAGNOSIS — E119 Type 2 diabetes mellitus without complications: Secondary | ICD-10-CM | POA: Diagnosis not present

## 2022-10-17 DIAGNOSIS — I96 Gangrene, not elsewhere classified: Secondary | ICD-10-CM | POA: Diagnosis not present

## 2022-10-17 DIAGNOSIS — Z66 Do not resuscitate: Secondary | ICD-10-CM | POA: Diagnosis present

## 2022-10-17 DIAGNOSIS — I5042 Chronic combined systolic (congestive) and diastolic (congestive) heart failure: Secondary | ICD-10-CM | POA: Diagnosis present

## 2022-10-17 DIAGNOSIS — Z950 Presence of cardiac pacemaker: Secondary | ICD-10-CM

## 2022-10-17 DIAGNOSIS — I480 Paroxysmal atrial fibrillation: Secondary | ICD-10-CM | POA: Diagnosis not present

## 2022-10-17 DIAGNOSIS — I4821 Permanent atrial fibrillation: Secondary | ICD-10-CM | POA: Diagnosis present

## 2022-10-17 DIAGNOSIS — Z9049 Acquired absence of other specified parts of digestive tract: Secondary | ICD-10-CM

## 2022-10-17 DIAGNOSIS — Z8546 Personal history of malignant neoplasm of prostate: Secondary | ICD-10-CM

## 2022-10-17 DIAGNOSIS — E1122 Type 2 diabetes mellitus with diabetic chronic kidney disease: Secondary | ICD-10-CM | POA: Diagnosis present

## 2022-10-17 DIAGNOSIS — M79661 Pain in right lower leg: Secondary | ICD-10-CM | POA: Diagnosis not present

## 2022-10-17 DIAGNOSIS — M79662 Pain in left lower leg: Secondary | ICD-10-CM | POA: Diagnosis not present

## 2022-10-17 DIAGNOSIS — F0390 Unspecified dementia without behavioral disturbance: Secondary | ICD-10-CM | POA: Diagnosis present

## 2022-10-17 DIAGNOSIS — K5669 Other partial intestinal obstruction: Secondary | ICD-10-CM | POA: Diagnosis not present

## 2022-10-17 DIAGNOSIS — E11621 Type 2 diabetes mellitus with foot ulcer: Secondary | ICD-10-CM | POA: Diagnosis present

## 2022-10-17 DIAGNOSIS — N184 Chronic kidney disease, stage 4 (severe): Secondary | ICD-10-CM | POA: Diagnosis present

## 2022-10-17 DIAGNOSIS — I70222 Atherosclerosis of native arteries of extremities with rest pain, left leg: Secondary | ICD-10-CM | POA: Diagnosis not present

## 2022-10-17 DIAGNOSIS — I495 Sick sinus syndrome: Secondary | ICD-10-CM | POA: Diagnosis present

## 2022-10-17 DIAGNOSIS — I9589 Other hypotension: Secondary | ICD-10-CM | POA: Diagnosis present

## 2022-10-17 DIAGNOSIS — I5022 Chronic systolic (congestive) heart failure: Secondary | ICD-10-CM | POA: Diagnosis not present

## 2022-10-17 DIAGNOSIS — Z515 Encounter for palliative care: Secondary | ICD-10-CM

## 2022-10-17 DIAGNOSIS — Z823 Family history of stroke: Secondary | ICD-10-CM

## 2022-10-17 DIAGNOSIS — K922 Gastrointestinal hemorrhage, unspecified: Secondary | ICD-10-CM | POA: Diagnosis not present

## 2022-10-17 DIAGNOSIS — Z8719 Personal history of other diseases of the digestive system: Secondary | ICD-10-CM | POA: Diagnosis not present

## 2022-10-17 DIAGNOSIS — M25551 Pain in right hip: Secondary | ICD-10-CM | POA: Diagnosis not present

## 2022-10-17 DIAGNOSIS — Z7189 Other specified counseling: Secondary | ICD-10-CM | POA: Diagnosis not present

## 2022-10-17 DIAGNOSIS — X58XXXA Exposure to other specified factors, initial encounter: Secondary | ICD-10-CM | POA: Diagnosis present

## 2022-10-17 DIAGNOSIS — N189 Chronic kidney disease, unspecified: Secondary | ICD-10-CM | POA: Diagnosis not present

## 2022-10-17 DIAGNOSIS — Z711 Person with feared health complaint in whom no diagnosis is made: Secondary | ICD-10-CM | POA: Diagnosis not present

## 2022-10-17 DIAGNOSIS — L97529 Non-pressure chronic ulcer of other part of left foot with unspecified severity: Secondary | ICD-10-CM | POA: Diagnosis present

## 2022-10-17 DIAGNOSIS — R5381 Other malaise: Secondary | ICD-10-CM | POA: Diagnosis present

## 2022-10-17 DIAGNOSIS — W228XXA Striking against or struck by other objects, initial encounter: Secondary | ICD-10-CM | POA: Diagnosis present

## 2022-10-17 DIAGNOSIS — N400 Enlarged prostate without lower urinary tract symptoms: Secondary | ICD-10-CM | POA: Diagnosis present

## 2022-10-17 DIAGNOSIS — Z95820 Peripheral vascular angioplasty status with implants and grafts: Secondary | ICD-10-CM | POA: Diagnosis not present

## 2022-10-17 DIAGNOSIS — Z7901 Long term (current) use of anticoagulants: Secondary | ICD-10-CM

## 2022-10-17 DIAGNOSIS — L03116 Cellulitis of left lower limb: Secondary | ICD-10-CM | POA: Diagnosis not present

## 2022-10-17 DIAGNOSIS — I5030 Unspecified diastolic (congestive) heart failure: Secondary | ICD-10-CM | POA: Diagnosis not present

## 2022-10-17 DIAGNOSIS — M25572 Pain in left ankle and joints of left foot: Secondary | ICD-10-CM | POA: Diagnosis not present

## 2022-10-17 DIAGNOSIS — Z87891 Personal history of nicotine dependence: Secondary | ICD-10-CM

## 2022-10-17 DIAGNOSIS — Z79899 Other long term (current) drug therapy: Secondary | ICD-10-CM

## 2022-10-17 DIAGNOSIS — R638 Other symptoms and signs concerning food and fluid intake: Secondary | ICD-10-CM | POA: Diagnosis not present

## 2022-10-17 DIAGNOSIS — M79672 Pain in left foot: Principal | ICD-10-CM

## 2022-10-17 DIAGNOSIS — M546 Pain in thoracic spine: Secondary | ICD-10-CM | POA: Diagnosis not present

## 2022-10-17 DIAGNOSIS — I739 Peripheral vascular disease, unspecified: Secondary | ICD-10-CM | POA: Diagnosis not present

## 2022-10-17 DIAGNOSIS — S92401A Displaced unspecified fracture of right great toe, initial encounter for closed fracture: Secondary | ICD-10-CM

## 2022-10-17 DIAGNOSIS — R1084 Generalized abdominal pain: Secondary | ICD-10-CM | POA: Diagnosis not present

## 2022-10-17 DIAGNOSIS — K439 Ventral hernia without obstruction or gangrene: Secondary | ICD-10-CM | POA: Diagnosis present

## 2022-10-17 LAB — BASIC METABOLIC PANEL
Anion gap: 13 (ref 5–15)
BUN: 41 mg/dL — ABNORMAL HIGH (ref 8–23)
CO2: 20 mmol/L — ABNORMAL LOW (ref 22–32)
Calcium: 7.9 mg/dL — ABNORMAL LOW (ref 8.9–10.3)
Chloride: 105 mmol/L (ref 98–111)
Creatinine, Ser: 2.07 mg/dL — ABNORMAL HIGH (ref 0.61–1.24)
GFR, Estimated: 29 mL/min — ABNORMAL LOW (ref >60)
Glucose, Bld: 173 mg/dL — ABNORMAL HIGH (ref 70–99)
Potassium: 4.5 mmol/L (ref 3.5–5.1)
Sodium: 138 mmol/L (ref 135–145)

## 2022-10-17 LAB — CBC WITH DIFFERENTIAL/PLATELET
Abs Immature Granulocytes: 0.09 10*3/uL — ABNORMAL HIGH (ref 0.00–0.07)
Basophils Absolute: 0 10*3/uL (ref 0.0–0.1)
Basophils Relative: 0 %
Eosinophils Absolute: 0.1 10*3/uL (ref 0.0–0.5)
Eosinophils Relative: 1 %
HCT: 32.5 % — ABNORMAL LOW (ref 39.0–52.0)
Hemoglobin: 10.3 g/dL — ABNORMAL LOW (ref 13.0–17.0)
Immature Granulocytes: 1 %
Lymphocytes Relative: 17 %
Lymphs Abs: 1.2 10*3/uL (ref 0.7–4.0)
MCH: 24.9 pg — ABNORMAL LOW (ref 26.0–34.0)
MCHC: 31.7 g/dL (ref 30.0–36.0)
MCV: 78.5 fL — ABNORMAL LOW (ref 80.0–100.0)
Monocytes Absolute: 0.8 10*3/uL (ref 0.1–1.0)
Monocytes Relative: 11 %
Neutro Abs: 4.9 10*3/uL (ref 1.7–7.7)
Neutrophils Relative %: 70 %
Platelets: 176 10*3/uL (ref 150–400)
RBC: 4.14 MIL/uL — ABNORMAL LOW (ref 4.22–5.81)
RDW: 18.2 % — ABNORMAL HIGH (ref 11.5–15.5)
WBC: 7 10*3/uL (ref 4.0–10.5)
nRBC: 0 % (ref 0.0–0.2)

## 2022-10-17 LAB — LACTIC ACID, PLASMA
Lactic Acid, Venous: 2.2 mmol/L (ref 0.5–1.9)
Lactic Acid, Venous: 1.8 mmol/L (ref 0.5–1.9)

## 2022-10-17 MED ORDER — SODIUM CHLORIDE 0.9 % IV SOLN
2.0000 g | Freq: Once | INTRAVENOUS | Status: AC
  Administered 2022-10-17: 2 g via INTRAVENOUS
  Filled 2022-10-17: qty 12.5

## 2022-10-17 MED ORDER — NALOXONE HCL 0.4 MG/ML IJ SOLN: 0.4000 mg | INTRAMUSCULAR | Status: DC | PRN

## 2022-10-17 MED ORDER — VANCOMYCIN HCL IN DEXTROSE 1-5 GM/200ML-% IV SOLN
1000.0000 mg | Freq: Once | INTRAVENOUS | Status: AC
  Administered 2022-10-17: 1000 mg via INTRAVENOUS
  Filled 2022-10-17: qty 200

## 2022-10-17 MED ORDER — ACETAMINOPHEN 650 MG RE SUPP: 650.0000 mg | Freq: Four times a day (QID) | RECTAL | Status: DC | PRN

## 2022-10-17 MED ORDER — METOPROLOL TARTRATE 25 MG PO TABS: 12.5000 mg | ORAL_TABLET | Freq: Two times a day (BID) | ORAL | Status: DC

## 2022-10-17 MED ORDER — SODIUM CHLORIDE 0.9 % IV SOLN
2.0000 g | INTRAVENOUS | Status: DC
  Administered 2022-10-18 – 2022-10-22 (×5): 2 g via INTRAVENOUS
  Filled 2022-10-17 (×5): qty 12.5

## 2022-10-17 MED ORDER — ACETAMINOPHEN 325 MG PO TABS
650.0000 mg | ORAL_TABLET | Freq: Four times a day (QID) | ORAL | Status: DC | PRN
  Administered 2022-10-18 – 2022-10-21 (×5): 650 mg via ORAL
  Filled 2022-10-17 (×5): qty 2

## 2022-10-17 MED ORDER — FENTANYL CITRATE PF 50 MCG/ML IJ SOSY
12.5000 ug | PREFILLED_SYRINGE | Freq: Once | INTRAMUSCULAR | Status: AC
  Administered 2022-10-17: 12.5 ug via INTRAVENOUS
  Filled 2022-10-17: qty 1

## 2022-10-17 MED ORDER — ONDANSETRON HCL 4 MG/2ML IJ SOLN
4.0000 mg | Freq: Four times a day (QID) | INTRAMUSCULAR | Status: DC | PRN
  Administered 2022-10-21: 4 mg via INTRAVENOUS
  Filled 2022-10-17: qty 2

## 2022-10-17 MED ORDER — VANCOMYCIN HCL IN DEXTROSE 1-5 GM/200ML-% IV SOLN
1000.0000 mg | INTRAVENOUS | Status: DC
  Administered 2022-10-19 – 2022-10-21 (×2): 1000 mg via INTRAVENOUS
  Filled 2022-10-17 (×3): qty 200

## 2022-10-17 MED ORDER — MELATONIN 3 MG PO TABS
3.0000 mg | ORAL_TABLET | Freq: Every evening | ORAL | Status: DC | PRN
  Administered 2022-10-18 – 2022-10-20 (×3): 3 mg via ORAL
  Filled 2022-10-17 (×3): qty 1

## 2022-10-17 MED ORDER — FENTANYL CITRATE PF 50 MCG/ML IJ SOSY
25.0000 ug | PREFILLED_SYRINGE | INTRAMUSCULAR | Status: DC | PRN
  Administered 2022-10-21 (×3): 25 ug via INTRAVENOUS
  Filled 2022-10-17 (×3): qty 1

## 2022-10-17 NOTE — Transitions of Care (Post Inpatient/ED Visit) (Signed)
   10/17/2022  Name: [name] MRN: [id number] DOB: November 25, 1923  Today's TOC FU Call Status: Today's TOC FU Call Status:: Unsuccessful Call (2nd Attempt) Unsuccessful Call (2nd Attempt) Date: 10/17/22  Attempted to reach the patient regarding the most recent Inpatient/ED visit.  Follow Up Plan: Additional outreach attempts will be made to reach the patient to complete the Transitions of Care (Post Inpatient/ED visit) call.   Alto Denver RN, MSN, CCM RN Care Manager  Chronic Care Management Direct Number: [phone]

## 2022-10-17 NOTE — ED Triage Notes (Signed)
Pt here via ems from home, currently on palliative care. Palliative care RN called due to MD thinking he may have a blood cloot. Pt with pain to RLE. Pt slow to respond and slurs speech, family reports this is his baseline.  118/70 HR 98-110 97% RA CBG 247 98.73F

## 2022-10-17 NOTE — H&P (Signed)
History and Physical      [name] ZOX:[id number] DOB: 12/19/1923 DOA: 10/17/2022; DOS: 10/17/2022  PCP: Mliss Sax, MD  Patient coming from: home   I have personally briefly reviewed patient's old medical records in [facility]  Chief Complaint: Left foot pain  HPI: [name] is a 89 y.o. male with medical history significant for paroxysmal atrial fibrillation complicated by sick sinus syndrome status post pacemaker placement, chronically anticoagulated on Eliquis, CKD stage IV associated baseline creatinine 2.0-2.3, essential hypertension, type 2 diabetes mellitus, chronic systolic heart failure, anemia of chronic kidney disease associated baseline hemoglobin 9-11, who is admitted to [facility] on 10/17/2022 with cellulitis of the left foot after presenting from home to [facility] ED complaining of left foot pain.   In the context of the patient's underlying dementia, following history is provided by the patient, the patient's family present at bedside, discussions with the EDP, and via chart review.  The patient has a known necrotic left great toe, for which he reportedly follows with podiatry as an outpatient.  He was recently hospitalized from 10/07/2022 to 10/16/2022 for cardiac tamponade, during which hospitalization he underwent pericardiocentesis.  He was ultimately discharged home yesterday.  Over the course of the ensuing day, family reports that the patient has developed left foot erythema, tenderness, increased warmth, swelling, which is new and different from the findings associated with his necrotic left great toe, prompting the patient to present to [facility] emergency department sooner for further evaluation management thereof.  Family notes, that the patient " stubbed his left big toe" 1 month ago, without any interval trauma involving the left foot.  Patient denies any chest pain or return of shortness of breath.  He also has a  diagnosis of chronic systolic heart failure, with most recent limited echo performed on 10/10/2022 notable for LVEF 45 to 50%.  He also has a diagnosis of paroxysmal atrial fibrillation complicated by sick sinus syndrome status post pacemaker placement, chronically anticoagulated on Eliquis.  Additionally, he carries a diagnosis of type 2 diabetes mellitus, which appears to be managed via lifestyle modifications at home.    ED Course:  Vital signs in the ED were notable for the following: Afebrile; heart rate in the 80s to low 100s; systolic blood pressures in the 1 teens to 130s; respiratory rate 20-24, oxygen saturation 98 to 100% on room air.  Labs were notable for the following: BMP notable for the following: Sodium 138, creatinine 2.07 compared to most recent prior serum creatinine did 1-2.28 on 10/16/2022, glucose 173.  Initial lactate 2.2, with repeat value trending down to 1.8.  CBC notable for white blood cell count 7000 compared to most recent prior will with cell count of 7100 on 10/16/2022, hemoglobin 10.3 compared to 9.7 on 10/16/2022.  Imaging and additional notable ED work-up: Plain films of the left foot show fracture of base of proximal phalanx of the great toe medially, will demonstrated no evidence of bony demineralization or any evidence of subcutaneous gas or foreign object.  Venous ultrasound of the bilateral lower extremities showed no evidence of acute DVT.  Inpatient pharmacy was consulted by EDP, with recommendations for cefepime and IV vancomycin.   While in the ED, the following were administered: Cefepime, IV vancomycin, fentanyl 12.5 mcg IV x 1 dose.  Subsequently, the patient was admitted for further evaluation management of suspected cellulitis of the left foot.      Review of Systems: As per HPI otherwise  10 point review of systems negative.   Past Medical History:  Diagnosis Date   Acute CVA (cerebrovascular accident) [facility])    Arthritis    Atrial fibrillation  ([name])    CKD (chronic kidney disease)    History of hiatal hernia    Hx SBO    Last 2013 all treated conservatively   Hypertension    Presence of permanent cardiac pacemaker    Prostate cancer ([name])    Stroke ([name]) 2017   left facial drooping noted 08/14/2016   Tachycardia-bradycardia syndrome ([name])    Type II diabetes mellitus ([name])    Ventral hernia    x3    Past Surgical History:  Procedure Laterality Date   ABDOMINAL AORTOGRAM W/LOWER EXTREMITY N/A 08/07/2016   Procedure: Abdominal Aortogram w/Lower Extremity;  Surgeon: Iran Ouch, MD;  Location: [facility] INVASIVE CV LAB;  Service: Cardiovascular;  Laterality: N/A;   CHOLECYSTECTOMY OPEN  03/31/2001   Dr Wiliam Ke   COLON SURGERY     EXPLORATORY LAPAROTOMY  08/2004   Hattie Perch 10/01/2010   HEMORRHOID SURGERY     HERNIA REPAIR     HIATAL HERNIA REPAIR  2002   INCISIONAL HERNIA REPAIR  08/2004   Hattie Perch 10/01/2010   INSERT / REPLACE / REMOVE PACEMAKER     IR THORACENTESIS ASP PLEURAL SPACE W/IMG GUIDE  07/23/2022   IR THORACENTESIS ASP PLEURAL SPACE W/IMG GUIDE  07/24/2022   IR THORACENTESIS ASP PLEURAL SPACE W/IMG GUIDE  07/25/2022   PERICARDIOCENTESIS N/A 10/09/2022   Procedure: PERICARDIOCENTESIS;  Surgeon: Corky Crafts, MD;  Location: [facility] INVASIVE CV LAB;  Service: Cardiovascular;  Laterality: N/A;   PERIPHERAL VASCULAR BALLOON ANGIOPLASTY  08/07/2016   Procedure: Peripheral Vascular Balloon Angioplasty;  Surgeon: Iran Ouch, MD;  Location: [facility] INVASIVE CV LAB;  Service: Cardiovascular;;  left popliteal artery   PERIPHERAL VASCULAR INTERVENTION  08/14/2016   popliteal artery/notes 08/14/2016   PERIPHERAL VASCULAR INTERVENTION Left 08/14/2016   Procedure: Peripheral Vascular Intervention;  Surgeon: Iran Ouch, MD;  Location: [facility] INVASIVE CV LAB;  Service: Cardiovascular;  Laterality: Left;  popliteal artery   PERMANENT PACEMAKER GENERATOR CHANGE N/A 04/06/2012   Procedure: PERMANENT PACEMAKER GENERATOR CHANGE;  Surgeon:  Marinus Maw, MD;  Location: [facility] CATH LAB;  Service: Cardiovascular;  Laterality: N/A;   PPM GENERATOR CHANGEOUT N/A 02/04/2022   Procedure: PPM GENERATOR CHANGEOUT;  Surgeon: Marinus Maw, MD;  Location: [facility] INVASIVE CV LAB;  Service: Cardiovascular;  Laterality: N/A;   SMALL INTESTINE SURGERY  09/16/2004   SBO resection, VH repair    Social History:  reports that he quit smoking about 67 years ago. His smoking use included cigarettes. He has a 22.50 pack-year smoking history. He has been exposed to tobacco smoke. He quit smokeless tobacco use about 70 years ago.  His smokeless tobacco use included chew. He reports that he does not drink alcohol and does not use drugs.   No Known Allergies  Family History  Problem Relation Age of Onset   Pneumonia Father    Stroke Brother    Stroke Sister    Cancer Brother        type unknown     Prior to Admission medications   Medication Sig Start Date End Date Taking? Authorizing Provider  apixaban (ELIQUIS) 2.5 MG TABS tablet Take 1 tablet (2.5 mg total) by mouth 2 (two) times daily. 07/29/22  Yes Azucena Fallen, MD  divalproex (DEPAKOTE SPRINKLE) 125 MG capsule Take 1 capsule (125 mg total)  by mouth 2 (two) times daily. 05/27/22 07/22/23 Yes Mliss Sax, MD  docusate sodium (COLACE) 100 MG capsule Take 1 capsule (100 mg total) by mouth daily. 07/10/22  Yes Amin, Ankit Chirag, MD  finasteride (PROSCAR) 5 MG tablet TAKE 1 TABLET (5 MG TOTAL) BY MOUTH DAILY. 06/07/22  Yes Mliss Sax, MD  furosemide (LASIX) 20 MG tablet Take 2 tablets (40 mg total) by mouth daily. 10/14/22  Yes Zannie Cove, MD  Iron, Ferrous Sulfate, 325 (65 Fe) MG TABS Take 1 tablet every other day. Patient taking differently: Take 1 tablet by mouth every other day. 06/13/22  Yes Mliss Sax, MD  latanoprost (XALATAN) 0.005 % ophthalmic solution Place 1 drop into both eyes at bedtime. 05/03/20  Yes [provider]  loratadine (CLARITIN)  10 MG tablet Take 10 mg by mouth daily as needed for rhinitis.   Yes [provider]  Melatonin 3 MG TBDP Take 1 tablet by mouth at bedtime. Patient taking differently: Take 3 mg by mouth at bedtime. 07/10/22  Yes Amin, Ankit Chirag, MD  memantine (NAMENDA) 10 MG tablet Take 1 tablet (10 mg total) by mouth 2 (two) times daily. 08/22/22  Yes Micki Riley, MD  metoprolol tartrate (LOPRESSOR) 25 MG tablet Take 0.5 tablets (12.5 mg total) by mouth 2 (two) times daily. 10/17/22  Yes Mliss Sax, MD  midodrine (PROAMATINE) 5 MG tablet Take 1 tablet (5 mg total) by mouth 3 (three) times daily with meals. 10/14/22  Yes Zannie Cove, MD  pantoprazole (PROTONIX) 40 MG tablet TAKE 1 TABLET(40 MG) BY MOUTH DAILY Patient taking differently: Take 40 mg by mouth daily. 01/01/22  Yes Mliss Sax, MD  tamsulosin (FLOMAX) 0.4 MG CAPS capsule Take 1 capsule (0.4 mg total) by mouth daily. 12/04/21  Yes Mliss Sax, MD     Objective    Physical Exam: Vitals:   10/17/22 1954 10/17/22 2030 10/17/22 2045 10/17/22 2100  BP:  (!) 125/93  (!) 116/92  Pulse:  (!) 32    Resp:  (!) 24  (!) 28  Temp: 98.7 F (37.1 C)     TempSrc: Oral     SpO2:   94%   Weight:      Height:        General: appears to be stated age; alert Skin: warm, dry, erythema associated dorsal aspect of left foot since fifth mild swelling, tenderness, in the absence of drainage Head:  AT/[place] Mouth:  Oral mucosa membranes appear moist, normal dentition Neck: supple; trachea midline Heart:  RRR; did not appreciate any M/R/G Lungs: CTAB, did not appreciate any wheezes, rales, or rhonchi Abdomen: + BS; soft, ND, NT Extremities: no peripheral edema, no muscle wasting Neuro: strength and sensation intact in upper and lower extremities b/l    Labs on Admission: I have personally reviewed following labs and imaging studies  CBC: Recent Labs  Lab 10/13/22 0120 10/14/22 0126 10/15/22 0030  10/16/22 0127 10/17/22 1110  WBC 8.9 8.7 8.7 7.1 7.0  NEUTROABS  --   --   --   --  4.9  HGB 11.0* 10.3* 10.9* 9.7* 10.3*  HCT 33.2* 30.9* 33.2* 28.8* 32.5*  MCV 76.0* 74.6* 76.3* 75.0* 78.5*  PLT 145* 135* 173 166 176   Basic Metabolic Panel: Recent Labs  Lab 10/11/22 0141 10/12/22 0433 10/13/22 0120 10/14/22 0126 10/16/22 0127 10/17/22 1110  NA 136 136 135 138 139 138  K 4.4 3.7 4.1 4.7 4.3 4.5  CL  99 104 104 104 104 105  CO2 24 22 22 23 23  20*  GLUCOSE 194* 115* 191* 142* 168* 173*  BUN 36* 35* 38* 38* 44* 41*  CREATININE 2.35* 1.98* 2.11* 2.00* 2.28* 2.07*  CALCIUM 7.9* 7.6* 7.3* 7.4* 7.9* 7.9*  MG  --   --  2.1 2.1  --   --   PHOS 3.6 3.4 3.4  --   --   --    GFR: Estimated Creatinine Clearance: 18.3 mL/min (A) (by C-G formula based on SCr of 2.07 mg/dL (H)). Liver Function Tests: Recent Labs  Lab 10/11/22 0141 10/12/22 0433 10/13/22 0120  ALBUMIN 2.1* 1.7* 2.1*   No results for input(s): "LIPASE", "AMYLASE" in the last 168 hours. No results for input(s): "AMMONIA" in the last 168 hours. Coagulation Profile: No results for input(s): "INR", "PROTIME" in the last 168 hours. Cardiac Enzymes: No results for input(s): "CKTOTAL", "CKMB", "CKMBINDEX", "TROPONINI" in the last 168 hours. BNP (last 3 results) No results for input(s): "PROBNP" in the last 8760 hours. HbA1C: No results for input(s): "HGBA1C" in the last 72 hours. CBG: Recent Labs  Lab 10/15/22 0556 10/15/22 1109 10/15/22 1543 10/15/22 2105 10/16/22 0557  GLUCAP 86 146* 109* 229* 143*   Lipid Profile: No results for input(s): "CHOL", "HDL", "LDLCALC", "TRIG", "CHOLHDL", "LDLDIRECT" in the last 72 hours. Thyroid Function Tests: No results for input(s): "TSH", "T4TOTAL", "FREET4", "T3FREE", "THYROIDAB" in the last 72 hours. Anemia Panel: No results for input(s): "VITAMINB12", "FOLATE", "FERRITIN", "TIBC", "IRON", "RETICCTPCT" in the last 72 hours. Urine analysis:    Component Value Date/Time    COLORURINE YELLOW 07/22/2022 1708   APPEARANCEUR CLEAR 07/22/2022 1708   LABSPEC 1.011 07/22/2022 1708   PHURINE 5.0 07/22/2022 1708   GLUCOSEU NEGATIVE 07/22/2022 1708   GLUCOSEU NEGATIVE 10/30/2020 1051   HGBUR NEGATIVE 07/22/2022 1708   BILIRUBINUR NEGATIVE 07/22/2022 1708   BILIRUBINUR neg 08/03/2021 1702   KETONESUR NEGATIVE 07/22/2022 1708   PROTEINUR NEGATIVE 07/22/2022 1708   UROBILINOGEN 1.0 08/03/2021 1702   UROBILINOGEN 0.2 10/30/2020 1051   NITRITE NEGATIVE 07/22/2022 1708   LEUKOCYTESUR NEGATIVE 07/22/2022 1708    Radiological Exams on Admission: DG Foot Complete Left  Result Date: 10/17/2022 CLINICAL DATA:  Left great toe wound.  Redness.  Pain EXAM: LEFT FOOT - COMPLETE 3 VIEW COMPARISON:  None Available. FINDINGS: Global osteopenia. Well corticated plantar and Achilles calcaneal spurs. Bandage along the great toe. There is a nondisplaced fracture of the medial aspect of the base of the proximal phalanx of the great toe. This could be acute. No additional fracture or dislocation. No definite erosive changes at this time. If there is further concern of bone infection, MRI or bone scan may be useful for further sensitivity IMPRESSION: Fracture of the base of the proximal phalanx of the great toe medially. Fracture line extends to the margin of the joint space of the first metatarsophalangeal joint. Osteopenia.  Calcaneal spurs. Bandage along the great toe Electronically Signed   By: Karen Kays M.D.   On: 10/17/2022 18:49   VAS [place] LOWER EXTREMITY VENOUS (DVT) (ONLY [facility])  Result Date: 10/17/2022  Lower Venous DVT Study Patient Name:  EBB DAVIDHEISER [facility]  Date of Exam:   10/17/2022 Medical Rec #: [id number]              Accession #:    [id number] Date of Birth: [birth year]             Patient Gender: M Patient Age:  89 years Exam Location:  [facility] Procedure:      VAS [place] LOWER EXTREMITY VENOUS (DVT) Referring Phys: Lynden Oxford  --------------------------------------------------------------------------------  Indications: Leg pain- patient endorses right lateral thigh pain, also with ulceration and discoloration to left leg.  Risk Factors: History of PAD with popliteal artery stent placement in 2018. Limitations: Poor ultrasound/tissue interface. Comparison Study: No prior studies. Performing Technologist: Jean Rosenthal RDMS, RVT  Examination Guidelines: A complete evaluation includes B-mode imaging, spectral Doppler, color Doppler, and power Doppler as needed of all accessible portions of each vessel. Bilateral testing is considered an integral part of a complete examination. Limited examinations for reoccurring indications may be performed as noted. The reflux portion of the exam is performed with the patient in reverse Trendelenburg.  +---------+---------------+---------+-----------+----------+--------------+ RIGHT    CompressibilityPhasicitySpontaneityPropertiesThrombus Aging +---------+---------------+---------+-----------+----------+--------------+ CFV      Full           Yes      Yes                                 +---------+---------------+---------+-----------+----------+--------------+ SFJ      Full                                                        +---------+---------------+---------+-----------+----------+--------------+ FV Prox  Full                                                        +---------+---------------+---------+-----------+----------+--------------+ FV Mid   Full                                                        +---------+---------------+---------+-----------+----------+--------------+ FV DistalFull                                                        +---------+---------------+---------+-----------+----------+--------------+ PFV      Full                                                         +---------+---------------+---------+-----------+----------+--------------+ POP      Full           Yes      Yes                                 +---------+---------------+---------+-----------+----------+--------------+ PTV      Full                                                        +---------+---------------+---------+-----------+----------+--------------+  PERO     Full                                                        +---------+---------------+---------+-----------+----------+--------------+   +---------+---------------+---------+-----------+----------+--------------+ LEFT     CompressibilityPhasicitySpontaneityPropertiesThrombus Aging +---------+---------------+---------+-----------+----------+--------------+ CFV      Full           Yes      Yes                                 +---------+---------------+---------+-----------+----------+--------------+ SFJ      Full                                                        +---------+---------------+---------+-----------+----------+--------------+ FV Prox  Full                                                        +---------+---------------+---------+-----------+----------+--------------+ FV Mid   Full                                                        +---------+---------------+---------+-----------+----------+--------------+ FV DistalFull                                                        +---------+---------------+---------+-----------+----------+--------------+ PFV      Full                                                        +---------+---------------+---------+-----------+----------+--------------+ POP      Full           Yes      Yes                                 +---------+---------------+---------+-----------+----------+--------------+ PTV      Full                                                         +---------+---------------+---------+-----------+----------+--------------+ PERO     Full                                                        +---------+---------------+---------+-----------+----------+--------------+  Summary: RIGHT: - There is no evidence of deep vein thrombosis in the lower extremity.  - No cystic structure found in the popliteal fossa.  - Incidental: Triphasic PTA, monophasic DPA.  LEFT: - There is no evidence of deep vein thrombosis in the lower extremity.  - No cystic structure found in the popliteal fossa.  - Incidental: Occlusion of distal segment of popliteal artery stent with recanalized, monophasic flow distally in the PTA and DPA.  *See table(s) above for measurements and observations.    Preliminary       Assessment/Plan   Principal Problem:   Cellulitis of left lower extremity Active Problems:   Paroxysmal atrial fibrillation ([name])   CKD (chronic kidney disease) stage 4, GFR 15-29 ml/min ([name])   History of anemia due to chronic kidney disease   DM2 (diabetes mellitus, type 2) ([name])   BPH (benign prostatic hyperplasia)   Chronic systolic CHF (congestive heart failure) ([name])    #) Cellulitis of left foot: Diagnosis on the basis of 1 day of progressive left foot erythema, tenderness, increased swelling, increased warmth, which he reports is a new finding.  Portal of entry possibilities include known necrotic left great toe.  Of note, no evidence of crepitus on physical exam nor any evidence of subcutaneous gas on plain films to suggest necrotizing fasciitis.  In the absence of fever or leukocytosis, SIRS materia not met for sepsis at this time.  However, in the context of  At least once Rescriptor in the form of mild tachycardia, criteria met for at least moderate least severe cellulitis.  EDP consulted our inpatient pharmacist for recommendations regarding antibiotic selection for cellulitis of the foot, with the possibility of underlying osteomyelitis,  with ensuing pharmacy recommendations for IV vancomycin and cefepime.  Will continue to these recommended IV antibiotics, pursue MRI of the left foot to further evaluate for underlying osteomyelitis.  Considered potential anaerobic coverage given history of underlying diabetes.  However, will refrain from initiation of such, in the absence of corresponding recommendations for anaerobic coverage via inpatient pharmacy consultation.  No evidence of DVT on venous ultrasound of the left lower extremity today.  Plan: Continue IV vancomycin and cefepime per inpatient pharmacy recommendations, as above.  Check ESR, CRP.  MRI of the left foot, as above.  Will use this interval data to determine indication for involvement of podiatry during this hospitalization.  Prn IV fentanyl.              #) Paroxysmal atrial fibrillation: Documented history of such. In setting of CHA2DS2-VASc score of  8, there is an indication for chronic anticoagulation for thromboembolic prophylaxis. Consistent with this, patient is chronically anticoagulated on  Eliquis. Home AV nodal blocking regimen: lopressor.  Most recent echocardiogram occurred on 10/10/2022 was notable for LVEF 45 to 50%.   Plan: monitor strict I's & O's and daily weights. CMP/CBC in AM. Check serum mag level. Continue home AV nodal blocking regimen.  Continue outpatient beta-blocker.  Will hold next dose of Eliquis, pending result of MRI of the left foot.                 #) Chronic systolic heart failure: documented history of such, with most recent echocardiogram performed on 10/10/2022, with results include LVEF 45 to 50%, as above. No clinical evidence to suggest acutely decompensated heart failure at this time. home diuretic regimen reportedly consists of the following: Lasix 40 mg p.o. daily.  He appears euvolemic at this time, notable given recent  hospitalization for cardiac tamponade status post pericardiocentesis.     Plan: monitor  strict I's & O's and daily weights. Repeat BMP in AM. Check serum mag level. Continue home diuretic regimen.               #) CKD Stage  4: Documented history of such, with baseline creatinine 2.0-2.3, with presenting creatinine consistent with this baseline.    Plan: Monitor strict I's and O's and daily weights.  Attempt to avoid nephrotoxic agents.  CMP/magnesium level in the AM.              #) Anemia of chronic kidney disease: Documented history of such, a/w with baseline hgb range 9-11, with presenting hgb consistent with this range, in the absence of any overt evidence of active bleed.     Plan: Repeat CBC in the morning.                 #) Type 2 Diabetes Mellitus: documented history of such.  Appears to be managed with lifestyle modifications in the absence of any insulin or oral hypoglycemic agents as an outpatient.  Most recent improvement A1c was found to be 7.2% when checked on 10/09/2022.  Presenting blood sugar noted to be 173.    Plan: accuchecks QAC and HS with low dose SSI.                #) Benign Prostatic Hyperplasia:  documented h/o such; on tamsulosin as well as finasteride as outpatient.   Plan: monitor strict I's & O's and daily weights. Repeat CMP in AM.  continue home tamsulosin.  continue home finasteride.        DVT prophylaxis: SCD's for now  Code Status: DNR/DNI , consistent with active MOST on file.  Disposition Plan: Per Rounding Team Consults called: none;  Admission status: inpatient     I SPENT GREATER THAN 75  MINUTES IN CLINICAL CARE TIME/MEDICAL DECISION-MAKING IN COMPLETING THIS ADMISSION.      Chaney Born Allissa Albright DO [facility]  From 7PM - 7AM   10/17/2022, 9:22 PM

## 2022-10-17 NOTE — Progress Notes (Signed)
Pharmacy Antibiotic Note  [name] is a 89 y.o. male for which pharmacy has been consulted for cefepime and vancomycin dosing for cellulitis. Patient with a history of AF on eliquis, DM, stroke, CKD, HF.  SCr 2.07 - at baseline WBC 7; LA 1.8; T 98.7; HR 102>93; RR 25  Plan: Cefepime 2g q24hr  Vancomycin 1000 mg q48hr (eAUC 490.2) unless change in renal function Trend WBC, Fever, Renal function F/u cultures, clinical course, WBC De-escalate when able Levels at steady state  Height: 5\' 11"  (180.3 cm) Weight: 62 kg (136 lb 11 oz) IBW/kg (Calculated) : 75.3  Temp (24hrs), Avg:98.5 F (36.9 C), Min:98.4 F (36.9 C), Max:98.7 F (37.1 C)  Recent Labs  Lab 10/12/22 0433 10/13/22 0120 10/14/22 0126 10/15/22 0030 10/16/22 0127 10/17/22 1110 10/17/22 1726 10/17/22 1858  WBC 9.0 8.9 8.7 8.7 7.1 7.0  --   --   CREATININE 1.98* 2.11* 2.00*  --  2.28* 2.07*  --   --   LATICACIDVEN  --   --   --   --   --   --  2.2* 1.8    Estimated Creatinine Clearance: 18.3 mL/min (A) (by C-G formula based on SCr of 2.07 mg/dL (H)).    No Known Allergies  Microbiology results: Pending  Thank you for allowing pharmacy to be a part of this patient's care.  Delmar Landau, PharmD, BCPS 10/17/2022 9:42 PM ED Clinical Pharmacist -  [phone]

## 2022-10-17 NOTE — Progress Notes (Signed)
Lower extremity venous bilateral study completed.  Preliminary results relayed to Tegeler, MD.  See CV Proc for preliminary results report.   Aiyonna Lucado, RDMS, RVT  

## 2022-10-17 NOTE — ED Notes (Signed)
ED TO INPATIENT HANDOFF REPORT  ED Nurse Name and Phone #: [phone]  S Name/Age/Gender [name] 89 y.o. male Room/Bed: 031C/031C  Code Status   Code Status: DNR  Home/SNF/Other Home Patient oriented to: self, place, time, and situation Is this baseline? Yes   Triage Complete: Triage complete  Chief Complaint Cellulitis of left lower extremity [L03.116]  Triage Note Pt here via ems from home, currently on palliative care. Palliative care RN called due to MD thinking he may have a blood cloot. Pt with pain to RLE. Pt slow to respond and slurs speech, family reports this is his baseline.  118/70 HR 98-110 97% RA CBG 247 98.38F   Allergies No Known Allergies  Level of Care/Admitting Diagnosis ED Disposition     ED Disposition  Admit   Condition  --   Comment  Hospital Area: [facility] [[id number]]  Level of Care: Progressive [102]  Admit to Progressive based on following criteria: MULTISYSTEM THREATS such as stable sepsis, metabolic/electrolyte imbalance with or without encephalopathy that is responding to early treatment.  May admit patient to [facility] or [facility] if equivalent level of care is available:: No  Covid Evaluation: Confirmed COVID Positive  Diagnosis: Cellulitis of left lower extremity [454098]  Admitting Physician: Angie Fava [[id number]]  Attending Physician: Angie Fava [[id number]]  Certification:: I certify this patient will need inpatient services for at least 2 midnights  Estimated Length of Stay: 2          B Medical/Surgery History Past Medical History:  Diagnosis Date   Acute CVA (cerebrovascular accident) [facility])    Arthritis    Atrial fibrillation ([name])    CKD (chronic kidney disease)    History of hiatal hernia    Hx SBO    Last 2013 all treated conservatively   Hypertension    Presence of permanent cardiac pacemaker    Prostate cancer ([name])    Stroke ([name]) 2017   left facial drooping  noted 08/14/2016   Tachycardia-bradycardia syndrome ([name])    Type II diabetes mellitus ([name])    Ventral hernia    x3   Past Surgical History:  Procedure Laterality Date   ABDOMINAL AORTOGRAM W/LOWER EXTREMITY N/A 08/07/2016   Procedure: Abdominal Aortogram w/Lower Extremity;  Surgeon: Iran Ouch, MD;  Location: [facility] INVASIVE CV LAB;  Service: Cardiovascular;  Laterality: N/A;   CHOLECYSTECTOMY OPEN  03/31/2001   Dr Wiliam Ke   COLON SURGERY     EXPLORATORY LAPAROTOMY  08/2004   Hattie Perch 10/01/2010   HEMORRHOID SURGERY     HERNIA REPAIR     HIATAL HERNIA REPAIR  2002   INCISIONAL HERNIA REPAIR  08/2004   Hattie Perch 10/01/2010   INSERT / REPLACE / REMOVE PACEMAKER     IR THORACENTESIS ASP PLEURAL SPACE W/IMG GUIDE  07/23/2022   IR THORACENTESIS ASP PLEURAL SPACE W/IMG GUIDE  07/24/2022   IR THORACENTESIS ASP PLEURAL SPACE W/IMG GUIDE  07/25/2022   PERICARDIOCENTESIS N/A 10/09/2022   Procedure: PERICARDIOCENTESIS;  Surgeon: Corky Crafts, MD;  Location: [facility] INVASIVE CV LAB;  Service: Cardiovascular;  Laterality: N/A;   PERIPHERAL VASCULAR BALLOON ANGIOPLASTY  08/07/2016   Procedure: Peripheral Vascular Balloon Angioplasty;  Surgeon: Iran Ouch, MD;  Location: [facility] INVASIVE CV LAB;  Service: Cardiovascular;;  left popliteal artery   PERIPHERAL VASCULAR INTERVENTION  08/14/2016   popliteal artery/notes 08/14/2016   PERIPHERAL VASCULAR INTERVENTION Left 08/14/2016   Procedure: Peripheral Vascular Intervention;  Surgeon: Iran Ouch, MD;  Location: [facility] INVASIVE CV LAB;  Service: Cardiovascular;  Laterality: Left;  popliteal artery   PERMANENT PACEMAKER GENERATOR CHANGE N/A 04/06/2012   Procedure: PERMANENT PACEMAKER GENERATOR CHANGE;  Surgeon: Marinus Maw, MD;  Location: [facility] CATH LAB;  Service: Cardiovascular;  Laterality: N/A;   PPM GENERATOR CHANGEOUT N/A 02/04/2022   Procedure: PPM GENERATOR CHANGEOUT;  Surgeon: Marinus Maw, MD;  Location: [facility] INVASIVE CV LAB;  Service: Cardiovascular;   Laterality: N/A;   SMALL INTESTINE SURGERY  09/16/2004   SBO resection, VH repair     A IV Location/Drains/Wounds Patient Lines/Drains/Airways Status     Active Line/Drains/Airways     Name Placement date Placement time Site Days   Peripheral IV 10/17/22 20 G Anterior;Right Forearm 10/17/22  1800  Forearm  less than 1   Pressure Injury 07/09/22 Buttocks Stage 1 -  Intact skin with non-blanchable redness of a localized area usually over a bony prominence. 07/09/22  1745  -- 100   Wound / Incision (Open or Dehisced) 10/09/22 Non-pressure wound Toe (Comment  which one) Anterior;Left nectrotic / black 10/09/22  0800  Toe (Comment  which one)  8            Intake/Output Last 24 hours  Intake/Output Summary (Last 24 hours) at 10/17/2022 2301 Last data filed at 10/17/2022 2251 Gross per 24 hour  Intake 296.3 ml  Output --  Net 296.3 ml    Labs/Imaging Results for orders placed or performed during the hospital encounter of 10/17/22 (from the past 48 hour(s))  CBC with Differential     Status: Abnormal   Collection Time: 10/17/22 11:10 AM  Result Value Ref Range   WBC 7.0 4.0 - 10.5 K/uL   RBC 4.14 (L) 4.22 - 5.81 MIL/uL   Hemoglobin 10.3 (L) 13.0 - 17.0 g/dL   HCT 16.1 (L) 09.6 - 04.5 %   MCV 78.5 (L) 80.0 - 100.0 fL   MCH 24.9 (L) 26.0 - 34.0 pg   MCHC 31.7 30.0 - 36.0 g/dL   RDW 40.9 (H) 81.1 - 91.4 %   Platelets 176 150 - 400 K/uL    Comment: REPEATED TO VERIFY   nRBC 0.0 0.0 - 0.2 %   Neutrophils Relative % 70 %   Neutro Abs 4.9 1.7 - 7.7 K/uL   Lymphocytes Relative 17 %   Lymphs Abs 1.2 0.7 - 4.0 K/uL   Monocytes Relative 11 %   Monocytes Absolute 0.8 0.1 - 1.0 K/uL   Eosinophils Relative 1 %   Eosinophils Absolute 0.1 0.0 - 0.5 K/uL   Basophils Relative 0 %   Basophils Absolute 0.0 0.0 - 0.1 K/uL   Immature Granulocytes 1 %   Abs Immature Granulocytes 0.09 (H) 0.00 - 0.07 K/uL    Comment: Performed at [facility], [address] [place], [place]  [address]  Basic metabolic panel     Status: Abnormal   Collection Time: 10/17/22 11:10 AM  Result Value Ref Range   Sodium 138 135 - 145 mmol/L   Potassium 4.5 3.5 - 5.1 mmol/L   Chloride 105 98 - 111 mmol/L   CO2 20 (L) 22 - 32 mmol/L   Glucose, Bld 173 (H) 70 - 99 mg/dL    Comment: Glucose reference range applies only to samples taken after fasting for at least 8 hours.   BUN 41 (H) 8 - 23 mg/dL   Creatinine, Ser 6.21 (H) 0.61 - 1.24 mg/dL   Calcium 7.9 (L) 8.9 - 10.3  mg/dL   GFR, Estimated 29 (L) >60 mL/min    Comment: (NOTE) Calculated using the CKD-EPI Creatinine Equation (2021)    Anion gap 13 5 - 15    Comment: Performed at [facility], [address] [place], [place] [address]  Lactic acid, plasma     Status: Abnormal   Collection Time: 10/17/22  5:26 PM  Result Value Ref Range   Lactic Acid, Venous 2.2 (HH) 0.5 - 1.9 mmol/L    Comment: CRITICAL VALUE NOTED. VALUE IS CONSISTENT WITH PREVIOUSLY REPORTED/CALLED VALUE Performed at [facility], [address], [place], [place] [address]   Lactic acid, plasma     Status: None   Collection Time: 10/17/22  6:58 PM  Result Value Ref Range   Lactic Acid, Venous 1.8 0.5 - 1.9 mmol/L    Comment: Performed at [facility], [address], [place], [place] [address]   DG Foot Complete Left  Result Date: 10/17/2022 CLINICAL DATA:  Left great toe wound.  Redness.  Pain EXAM: LEFT FOOT - COMPLETE 3 VIEW COMPARISON:  None Available. FINDINGS: Global osteopenia. Well corticated plantar and Achilles calcaneal spurs. Bandage along the great toe. There is a nondisplaced fracture of the medial aspect of the base of the proximal phalanx of the great toe. This could be acute. No additional fracture or dislocation. No definite erosive changes at this time. If there is further concern of bone infection, MRI or bone scan may be useful for further sensitivity IMPRESSION: Fracture of the base of the proximal phalanx of the great toe  medially. Fracture line extends to the margin of the joint space of the first metatarsophalangeal joint. Osteopenia.  Calcaneal spurs. Bandage along the great toe Electronically Signed   By: Karen Kays M.D.   On: 10/17/2022 18:49   VAS [place] LOWER EXTREMITY VENOUS (DVT) (ONLY [facility])  Result Date: 10/17/2022  Lower Venous DVT Study Patient Name:  [name] [facility]  Date of Exam:   10/17/2022 Medical Rec #: [id number]              Accession #:    [id number] Date of Birth: 09-07-1923             Patient Gender: M Patient Age:   27 years Exam Location:  [facility] Procedure:      VAS [place] LOWER EXTREMITY VENOUS (DVT) Referring Phys: Lynden Oxford --------------------------------------------------------------------------------  Indications: Leg pain- patient endorses right lateral thigh pain, also with ulceration and discoloration to left leg.  Risk Factors: History of PAD with popliteal artery stent placement in 2018. Limitations: Poor ultrasound/tissue interface. Comparison Study: No prior studies. Performing Technologist: Jean Rosenthal RDMS, RVT  Examination Guidelines: A complete evaluation includes B-mode imaging, spectral Doppler, color Doppler, and power Doppler as needed of all accessible portions of each vessel. Bilateral testing is considered an integral part of a complete examination. Limited examinations for reoccurring indications may be performed as noted. The reflux portion of the exam is performed with the patient in reverse Trendelenburg.  +---------+---------------+---------+-----------+----------+--------------+ RIGHT    CompressibilityPhasicitySpontaneityPropertiesThrombus Aging +---------+---------------+---------+-----------+----------+--------------+ CFV      Full           Yes      Yes                                 +---------+---------------+---------+-----------+----------+--------------+ SFJ      Full                                                         +---------+---------------+---------+-----------+----------+--------------+  FV Prox  Full                                                        +---------+---------------+---------+-----------+----------+--------------+ FV Mid   Full                                                        +---------+---------------+---------+-----------+----------+--------------+ FV DistalFull                                                        +---------+---------------+---------+-----------+----------+--------------+ PFV      Full                                                        +---------+---------------+---------+-----------+----------+--------------+ POP      Full           Yes      Yes                                 +---------+---------------+---------+-----------+----------+--------------+ PTV      Full                                                        +---------+---------------+---------+-----------+----------+--------------+ PERO     Full                                                        +---------+---------------+---------+-----------+----------+--------------+   +---------+---------------+---------+-----------+----------+--------------+ LEFT     CompressibilityPhasicitySpontaneityPropertiesThrombus Aging +---------+---------------+---------+-----------+----------+--------------+ CFV      Full           Yes      Yes                                 +---------+---------------+---------+-----------+----------+--------------+ SFJ      Full                                                        +---------+---------------+---------+-----------+----------+--------------+ FV Prox  Full                                                        +---------+---------------+---------+-----------+----------+--------------+  FV Mid   Full                                                         +---------+---------------+---------+-----------+----------+--------------+ FV DistalFull                                                        +---------+---------------+---------+-----------+----------+--------------+ PFV      Full                                                        +---------+---------------+---------+-----------+----------+--------------+ POP      Full           Yes      Yes                                 +---------+---------------+---------+-----------+----------+--------------+ PTV      Full                                                        +---------+---------------+---------+-----------+----------+--------------+ PERO     Full                                                        +---------+---------------+---------+-----------+----------+--------------+    Summary: RIGHT: - There is no evidence of deep vein thrombosis in the lower extremity.  - No cystic structure found in the popliteal fossa.  - Incidental: Triphasic PTA, monophasic DPA.  LEFT: - There is no evidence of deep vein thrombosis in the lower extremity.  - No cystic structure found in the popliteal fossa.  - Incidental: Occlusion of distal segment of popliteal artery stent with recanalized, monophasic flow distally in the PTA and DPA.  *See table(s) above for measurements and observations.    Preliminary     Pending Labs Unresulted Labs (From admission, onward)     Start     Ordered   10/18/22 0500  CBC with Differential/Platelet  Tomorrow morning,   R        10/17/22 2116   10/18/22 0500  Comprehensive metabolic panel  Tomorrow morning,   R        10/17/22 2116   10/18/22 0500  Magnesium  Tomorrow morning,   R        10/17/22 2116   10/17/22 2118  C-reactive protein  Add-on,   AD        10/17/22 2117   10/17/22 2118  Sedimentation rate  Add-on,   AD        10/17/22 2117   10/17/22 2117  Magnesium  Add-on,   AD  10/17/22 2116             Vitals/Pain Today's Vitals   10/17/22 2046 10/17/22 2100 10/17/22 2230 10/17/22 2300  BP:  (!) 116/92 (!) 129/96 132/88  Pulse:   93 (!) 116  Resp:  (!) 28 (!) 25 (!) 26  Temp:      TempSrc:      SpO2:   100% 99%  Weight:      Height:      PainSc: 8        Isolation Precautions No active isolations  Medications Medications  acetaminophen (TYLENOL) tablet 650 mg (has no administration in time range)    Or  acetaminophen (TYLENOL) suppository 650 mg (has no administration in time range)  melatonin tablet 3 mg (has no administration in time range)  ondansetron (ZOFRAN) injection 4 mg (has no administration in time range)  naloxone (NARCAN) injection 0.4 mg (has no administration in time range)  fentaNYL (SUBLIMAZE) injection 25 mcg (has no administration in time range)  ceFEPIme (MAXIPIME) 2 g in sodium chloride 0.9 % 100 mL IVPB (has no administration in time range)  vancomycin (VANCOCIN) IVPB 1000 mg/200 mL premix (has no administration in time range)  fentaNYL (SUBLIMAZE) injection 12.5 mcg (12.5 mcg Intravenous Given 10/17/22 1954)  vancomycin (VANCOCIN) IVPB 1000 mg/200 mL premix (0 mg Intravenous Stopped 10/17/22 2251)  ceFEPIme (MAXIPIME) 2 g in sodium chloride 0.9 % 100 mL IVPB (0 g Intravenous Stopped 10/17/22 2251)    Mobility non-ambulatory     Focused Assessments    R Recommendations: See Admitting Provider Note  Report given to:   Additional Notes: pt non ambulatory, a/ox4 during the day but has increased confusion at night; continent x2

## 2022-10-17 NOTE — Progress Notes (Signed)
PATIENT NAME: [name] DOB: 1924-02-20 MRN: [id number]  PRIMARY CARE PROVIDER: Mliss Sax, MD  RESPONSIBLE PARTY:  Acct ID - Guarantor Home Phone Work Phone Relationship Acct Type  [id number] [name], [name] [phone]  Self P/F     [address], [place], [place] [address]     Palliative Care Follow Up Encounter Note    Completed home visit. [place] caregiver and from the [place] also present.   Today's Visit: pt was sent to ED as per PCP due to extreme pain in his R hip and thigh    Respiratory: no SOB during this visit; has SOB with exertion and still uses PRN O2   Cardiac: CHF; no BLE edema   Cognitive: alert and oriented to self and caregivers; has Dementia booklet    Appetite: still eats 2-3 reduced meals and snacks; drinks when he's thirsty but drinks enough throughout the day d/t caregivers   GI/GU: has BM regularly ever few days and takes Colace every other day   Mobility: pt is no longer able to ambulate but is able to stand with assist   ADLs: depends on caregivers to do ADLs   Sleeping Pattern: used sleep no less than 8 hrs but now because of pain he sleeps 5 or less   Pain: 10/10 pain in his R hip and thigh at this time   Wt: 08/22/22 he weighed 160 lbs; 09/19/22 wt was 147.4 lbs 10/07/22 he weighed 137 lbs         Goals of Care: To stay in the home with caregivers     CODE STATUS: DNR ADVANCED DIRECTIVES: Y MOST FORM: N PPS: 40%   PHYSICAL EXAM:   VITALS: Today's Vitals   10/17/22 1347  BP: (!) 82/58  Pulse: (!) 102  Temp: 98.4 F (36.9 C)  TempSrc: Temporal  SpO2: 95%  PainSc: 10-Worst pain ever  PainLoc: Hip    LUNGS: clear to auscultation , decreased breath sounds CARDIAC: Cor irreg, irreg RRR EXTREMITIES: pt still has a discolored great toe SKIN: cool, dry,  NEURO: positive for gait problems, memory problems, and weakness   Called PCP spoke to [facility], Dr. Doreene Burke advised Palliative nurse to send pt to ED.  Granddaughter Hessie Diener also wants pt to go to ED; nurse will schedule follow up appt when pt is back at home    [name] Clementeen Graham, LPN

## 2022-10-17 NOTE — ED Provider Notes (Signed)
[place] [facility]ERG[facility] DEPARTMENT AT [facility] Provider Note   CSN: [id number] Arrival date & time: 10/17/22  1612     History  Chief Complaint  Patient presents with   Leg Pain    [name] is a 89 y.o. male.  The history is provided by the patient and medical records. No language interpreter was used.  Leg Pain Location:  Leg and foot Time since incident:  3 days Injury: no   Leg location:  L lower leg and R lower leg Foot location:  L foot Pain details:    Quality:  Aching   Severity:  Severe   Onset quality:  Gradual   Timing:  Intermittent   Progression:  Waxing and waning Relieved by:  Nothing Worsened by:  Nothing Ineffective treatments:  None tried Associated symptoms: swelling   Associated symptoms: no back pain, no fatigue, no fever, no neck pain and no numbness        Home Medications Prior to Admission medications   Medication Sig Start Date End Date Taking? Authorizing Provider  apixaban (ELIQUIS) 2.5 MG TABS tablet Take 1 tablet (2.5 mg total) by mouth 2 (two) times daily. 07/29/22   Azucena Fallen, MD  divalproex (DEPAKOTE SPRINKLE) 125 MG capsule Take 1 capsule (125 mg total) by mouth 2 (two) times daily. 05/27/22 07/22/23  Mliss Sax, MD  docusate sodium (COLACE) 100 MG capsule Take 1 capsule (100 mg total) by mouth daily. 07/10/22   Amin, Ankit Chirag, MD  finasteride (PROSCAR) 5 MG tablet TAKE 1 TABLET (5 MG TOTAL) BY MOUTH DAILY. 06/07/22   Mliss Sax, MD  furosemide (LASIX) 20 MG tablet Take 2 tablets (40 mg total) by mouth daily. 10/14/22   Zannie Cove, MD  Iron, Ferrous Sulfate, 325 (65 Fe) MG TABS Take 1 tablet every other day. Patient taking differently: Take 1 tablet by mouth every other day. 06/13/22   Mliss Sax, MD  latanoprost (XALATAN) 0.005 % ophthalmic solution Place 1 drop into both eyes at bedtime. 05/03/20   [provider]  loratadine (CLARITIN) 10 MG tablet Take  10 mg by mouth daily as needed for rhinitis.    [provider]  Melatonin 3 MG TBDP Take 1 tablet by mouth at bedtime. Patient taking differently: Take 3 mg by mouth at bedtime. 07/10/22   Amin, Loura Halt, MD  memantine (NAMENDA) 10 MG tablet Take 1 tablet (10 mg total) by mouth 2 (two) times daily. 08/22/22   Micki Riley, MD  metoprolol tartrate (LOPRESSOR) 25 MG tablet Take 0.5 tablets (12.5 mg total) by mouth 2 (two) times daily. 10/17/22   Mliss Sax, MD  midodrine (PROAMATINE) 5 MG tablet Take 1 tablet (5 mg total) by mouth 3 (three) times daily with meals. 10/14/22   Zannie Cove, MD  pantoprazole (PROTONIX) 40 MG tablet TAKE 1 TABLET(40 MG) BY MOUTH DAILY Patient taking differently: Take 40 mg by mouth daily. 01/01/22   Mliss Sax, MD  tamsulosin (FLOMAX) 0.4 MG CAPS capsule Take 1 capsule (0.4 mg total) by mouth daily. 12/04/21   Mliss Sax, MD      Allergies    Patient has no known allergies.    Review of Systems   Review of Systems  Constitutional:  Negative for chills, fatigue and fever.  HENT:  Negative for congestion.   Respiratory:  Negative for cough, chest tightness, shortness of breath and wheezing.   Cardiovascular:  Positive for leg swelling.  Negative for chest pain and palpitations.  Gastrointestinal:  Negative for abdominal pain, constipation, diarrhea, nausea and vomiting.  Genitourinary:  Negative for dysuria and flank pain.  Musculoskeletal:  Negative for back pain, neck pain and neck stiffness.  Skin:  Positive for rash and wound.  Neurological:  Negative for headaches.  Psychiatric/Behavioral:  Negative for agitation and confusion.   All other systems reviewed and are negative.   Physical Exam Updated Vital Signs There were no vitals taken for this visit. Physical Exam Vitals and nursing note reviewed.  Constitutional:      General: He is not in acute distress.    Appearance: He is well-developed. He is not  ill-appearing, toxic-appearing or diaphoretic.  HENT:     Head: Normocephalic and atraumatic.     Nose: Nose normal.     Mouth/Throat:     Mouth: Mucous membranes are moist.  Eyes:     Conjunctiva/sclera: Conjunctivae normal.  Cardiovascular:     Rate and Rhythm: Normal rate and regular rhythm.     Heart sounds: No murmur heard. Pulmonary:     Effort: Pulmonary effort is normal. No respiratory distress.     Breath sounds: Normal breath sounds. No wheezing, rhonchi or rales.  Chest:     Chest wall: No tenderness.  Abdominal:     Palpations: Abdomen is soft.     Tenderness: There is no abdominal tenderness.  Musculoskeletal:        General: Swelling and tenderness present.     Cervical back: Neck supple.     Right lower leg: Edema present.     Left lower leg: Edema present.  Skin:    General: Skin is warm and dry.     Capillary Refill: Capillary refill takes less than 2 seconds.     Findings: Erythema present.  Neurological:     Mental Status: He is alert. Mental status is at baseline.     ED Results / Procedures / Treatments   Labs (all labs ordered are listed, but only abnormal results are displayed) Labs Reviewed  CBC WITH DIFFERENTIAL/PLATELET - Abnormal; Notable for the following components:      Result Value   RBC 4.14 (*)    Hemoglobin 10.3 (*)    HCT 32.5 (*)    MCV 78.5 (*)    MCH 24.9 (*)    RDW 18.2 (*)    Abs Immature Granulocytes 0.09 (*)    All other components within normal limits  BASIC METABOLIC PANEL - Abnormal; Notable for the following components:   CO2 20 (*)    Glucose, Bld 173 (*)    BUN 41 (*)    Creatinine, Ser 2.07 (*)    Calcium 7.9 (*)    GFR, Estimated 29 (*)    All other components within normal limits  LACTIC ACID, PLASMA - Abnormal; Notable for the following components:   Lactic Acid, Venous 2.2 (*)    All other components within normal limits  LACTIC ACID, PLASMA    EKG None  Radiology DG Foot Complete Left  Result  Date: 10/17/2022 CLINICAL DATA:  Left great toe wound.  Redness.  Pain EXAM: LEFT FOOT - COMPLETE 3 VIEW COMPARISON:  None Available. FINDINGS: Global osteopenia. Well corticated plantar and Achilles calcaneal spurs. Bandage along the great toe. There is a nondisplaced fracture of the medial aspect of the base of the proximal phalanx of the great toe. This could be acute. No additional fracture or dislocation. No definite erosive changes at  this time. If there is further concern of bone infection, MRI or bone scan may be useful for further sensitivity IMPRESSION: Fracture of the base of the proximal phalanx of the great toe medially. Fracture line extends to the margin of the joint space of the first metatarsophalangeal joint. Osteopenia.  Calcaneal spurs. Bandage along the great toe Electronically Signed   By: Karen Kays M.D.   On: 10/17/2022 18:49   VAS [place] LOWER EXTREMITY VENOUS (DVT) (ONLY [facility] & [facility])  Result Date: 10/17/2022  Lower Venous DVT Study Patient Name:  RALPHEL [facility]  Date of Exam:   10/17/2022 Medical Rec #: [id number]              Accession #:    [id number] Date of Birth: [birth year]             Patient Gender: M Patient Age:   54 years Exam Location:  [facility] Procedure:      VAS [place] LOWER EXTREMITY VENOUS (DVT) Referring Phys: Lynden Oxford --------------------------------------------------------------------------------  Indications: Leg pain- patient endorses right lateral thigh pain, also with ulceration and discoloration to left leg.  Risk Factors: History of PAD with popliteal artery stent placement in 2018. Limitations: Poor ultrasound/tissue interface. Comparison Study: No prior studies. Performing Technologist: Jean Rosenthal RDMS, RVT  Examination Guidelines: A complete evaluation includes B-mode imaging, spectral Doppler, color Doppler, and power Doppler as needed of all accessible portions of each vessel. Bilateral testing is considered an integral part of a complete  examination. Limited examinations for reoccurring indications may be performed as noted. The reflux portion of the exam is performed with the patient in reverse Trendelenburg.  +---------+---------------+---------+-----------+----------+--------------+ RIGHT    CompressibilityPhasicitySpontaneityPropertiesThrombus Aging +---------+---------------+---------+-----------+----------+--------------+ CFV      Full           Yes      Yes                                 +---------+---------------+---------+-----------+----------+--------------+ SFJ      Full                                                        +---------+---------------+---------+-----------+----------+--------------+ FV Prox  Full                                                        +---------+---------------+---------+-----------+----------+--------------+ FV Mid   Full                                                        +---------+---------------+---------+-----------+----------+--------------+ FV DistalFull                                                        +---------+---------------+---------+-----------+----------+--------------+ PFV      Full                                                        +---------+---------------+---------+-----------+----------+--------------+  POP      Full           Yes      Yes                                 +---------+---------------+---------+-----------+----------+--------------+ PTV      Full                                                        +---------+---------------+---------+-----------+----------+--------------+ PERO     Full                                                        +---------+---------------+---------+-----------+----------+--------------+   +---------+---------------+---------+-----------+----------+--------------+ LEFT     CompressibilityPhasicitySpontaneityPropertiesThrombus Aging  +---------+---------------+---------+-----------+----------+--------------+ CFV      Full           Yes      Yes                                 +---------+---------------+---------+-----------+----------+--------------+ SFJ      Full                                                        +---------+---------------+---------+-----------+----------+--------------+ FV Prox  Full                                                        +---------+---------------+---------+-----------+----------+--------------+ FV Mid   Full                                                        +---------+---------------+---------+-----------+----------+--------------+ FV DistalFull                                                        +---------+---------------+---------+-----------+----------+--------------+ PFV      Full                                                        +---------+---------------+---------+-----------+----------+--------------+ POP      Full           Yes      Yes                                 +---------+---------------+---------+-----------+----------+--------------+  PTV      Full                                                        +---------+---------------+---------+-----------+----------+--------------+ PERO     Full                                                        +---------+---------------+---------+-----------+----------+--------------+    Summary: RIGHT: - There is no evidence of deep vein thrombosis in the lower extremity.  - No cystic structure found in the popliteal fossa.  - Incidental: Triphasic PTA, monophasic DPA.  LEFT: - There is no evidence of deep vein thrombosis in the lower extremity.  - No cystic structure found in the popliteal fossa.  - Incidental: Occlusion of distal segment of popliteal artery stent with recanalized, monophasic flow distally in the PTA and DPA.  *See table(s) above for measurements and  observations.    Preliminary     Procedures Procedures    Medications Ordered in ED Medications  vancomycin (VANCOCIN) IVPB 1000 mg/200 mL premix (has no administration in time range)  ceFEPIme (MAXIPIME) 2 g in sodium chloride 0.9 % 100 mL IVPB (has no administration in time range)  fentaNYL (SUBLIMAZE) injection 12.5 mcg (12.5 mcg Intravenous Given 10/17/22 1954)    ED Course/ Medical Decision Making/ A&P                             Medical Decision Making Amount and/or Complexity of Data Reviewed Labs: ordered. Radiology: ordered.  Risk Prescription drug management. Decision regarding hospitalization.    Edrin Sampley is a 89 y.o. male with a past medical history significant for atrial fibrillation on Eliquis therapy, diabetes, previous stroke, CKD, CHF, documentation of gangrene of toe on left foot, and glaucoma who presents at the direction of PCP to rule out blood clot.  According to patient, he has had pain in his legs since "the [place] War".  He does agree that over the last week or so he has had more pain in both legs with left worse than right.  He thinks that they are sometimes swollen.  He also reports he is having more pain in his left foot and he has a wound on his left foot is bandaged.    Otherwise he is denying any chest pain, short of breath, palpitations, or abdominal pain.  Denies pain in the rest of his legs.  No hip pain or knee pains reported.  On exam, patient has some very mild edema in his legs and I did feel palpable DP pulses bilaterally.  He has some erythema on the dorsum of the left foot greater than the right and when I took the bandage down I see a necrotic appearing wound on his left distal toe.  He is able to move his feet however and had intact sensation he reports.  Otherwise chest was nontender abdomen nontender.  Hips and knees nontender.  Patient resting comfortably.  With his report of being sent here to rule out a blood clot and his  reported leg pain in both legs and  possible swelling, will get DVT ultrasounds.  With the wound and redness we will get x-ray of the left foot to rule out osteomyelitis development we will get some screening labs.  Anticipate discussion with family when they arrive and continue workup to determine disposition.  Family arrived and agree that over the last few days the patient's left foot has become more swollen red and painful they are concerned about infection worsening from the toe.  He is currently not on antibiotics and just got out of hospital yesterday after the admission for shortness of breath and pericardiocentesis for tamponade.  Patient reports his breathing is better and has no respiratory or chest complaints at this time.  Ultrasounds were obtained and did not show evidence of DVT and had distal arterial flow.  I do not suspect a blockage causing his symptoms but due to the appearance and discomfort, I am concerned that he is having evolution of this known left great toe gangrene that is causing more infection and pain.  Will get labs and an x-ray and anticipate he may need readmission for more definitive management of this left foot.  Family reports that it has been ascending with redness over just the last few days.  X-ray returned without evidence of bony erosion but does show a toe fracture.  When I asked the patient, they report he did jam his toe last month and thought that could have been the injury.  This however does not explain the warmth, redness, and pain worsening from the wound over the last few days and I am still concerned about cellulitis or occult osteomyelitis from his necrotic wound.  Chart review shows that he was going to have amputation but with this pericardiocentesis and tamponade they decided to hold off.  Will call for admission and discuss with medicine team what antibiotics would be most appropriate.  Spoke to pharmacy who will order antibiotics for [place] for this  toe/foot infection.        Final Clinical Impression(s) / ED Diagnoses Final diagnoses:  Left foot pain  Cellulitis of left lower extremity  Closed fracture of phalanx of both great toes, initial encounter    Clinical Impression: 1. Left foot pain   2. Cellulitis of left lower extremity   3. Closed fracture of phalanx of both great toes, initial encounter     Disposition: Admit  This note was prepared with assistance of Dragon voice recognition software. Occasional wrong-word or sound-a-like substitutions may have occurred due to the inherent limitations of voice recognition software.     Kalem Rockwell, Canary Brim, MD 10/17/22 [phone]

## 2022-10-18 ENCOUNTER — Encounter (HOSPITAL_COMMUNITY): Payer: Self-pay | Admitting: Internal Medicine

## 2022-10-18 ENCOUNTER — Telehealth: Payer: Self-pay

## 2022-10-18 ENCOUNTER — Inpatient Hospital Stay: Payer: No Typology Code available for payment source | Admitting: Family Medicine

## 2022-10-18 DIAGNOSIS — M79672 Pain in left foot: Secondary | ICD-10-CM

## 2022-10-18 DIAGNOSIS — S92401A Displaced unspecified fracture of right great toe, initial encounter for closed fracture: Secondary | ICD-10-CM

## 2022-10-18 DIAGNOSIS — S92402A Displaced unspecified fracture of left great toe, initial encounter for closed fracture: Secondary | ICD-10-CM | POA: Diagnosis not present

## 2022-10-18 DIAGNOSIS — I5030 Unspecified diastolic (congestive) heart failure: Secondary | ICD-10-CM | POA: Diagnosis not present

## 2022-10-18 DIAGNOSIS — I5022 Chronic systolic (congestive) heart failure: Secondary | ICD-10-CM | POA: Diagnosis present

## 2022-10-18 DIAGNOSIS — F0390 Unspecified dementia without behavioral disturbance: Secondary | ICD-10-CM | POA: Diagnosis not present

## 2022-10-18 DIAGNOSIS — L03116 Cellulitis of left lower limb: Secondary | ICD-10-CM

## 2022-10-18 LAB — COMPREHENSIVE METABOLIC PANEL
ALT: 8 U/L (ref 0–44)
AST: 17 U/L (ref 15–41)
Albumin: 2.5 g/dL — ABNORMAL LOW (ref 3.5–5.0)
Alkaline Phosphatase: 51 U/L (ref 38–126)
Anion gap: 16 — ABNORMAL HIGH (ref 5–15)
BUN: 38 mg/dL — ABNORMAL HIGH (ref 8–23)
CO2: 19 mmol/L — ABNORMAL LOW (ref 22–32)
Calcium: 7.7 mg/dL — ABNORMAL LOW (ref 8.9–10.3)
Chloride: 104 mmol/L (ref 98–111)
Creatinine, Ser: 2.05 mg/dL — ABNORMAL HIGH (ref 0.61–1.24)
GFR, Estimated: 29 mL/min — ABNORMAL LOW (ref >60)
Glucose, Bld: 159 mg/dL — ABNORMAL HIGH (ref 70–99)
Potassium: 4.3 mmol/L (ref 3.5–5.1)
Sodium: 139 mmol/L (ref 135–145)
Total Bilirubin: 0.5 mg/dL (ref 0.3–1.2)
Total Protein: 5.4 g/dL — ABNORMAL LOW (ref 6.5–8.1)

## 2022-10-18 LAB — CBC WITH DIFFERENTIAL/PLATELET
Abs Immature Granulocytes: 0.07 10*3/uL (ref 0.00–0.07)
Basophils Absolute: 0.1 10*3/uL (ref 0.0–0.1)
Basophils Relative: 1 %
Eosinophils Absolute: 0.1 10*3/uL (ref 0.0–0.5)
Eosinophils Relative: 1 %
HCT: 31.3 % — ABNORMAL LOW (ref 39.0–52.0)
Hemoglobin: 10.6 g/dL — ABNORMAL LOW (ref 13.0–17.0)
Immature Granulocytes: 1 %
Lymphocytes Relative: 14 %
Lymphs Abs: 1.1 10*3/uL (ref 0.7–4.0)
MCH: 24.9 pg — ABNORMAL LOW (ref 26.0–34.0)
MCHC: 33.9 g/dL (ref 30.0–36.0)
MCV: 73.6 fL — ABNORMAL LOW (ref 80.0–100.0)
Monocytes Absolute: 1 10*3/uL (ref 0.1–1.0)
Monocytes Relative: 13 %
Neutro Abs: 5.5 10*3/uL (ref 1.7–7.7)
Neutrophils Relative %: 70 %
Platelets: 126 10*3/uL — ABNORMAL LOW (ref 150–400)
RBC: 4.25 MIL/uL (ref 4.22–5.81)
RDW: 17.3 % — ABNORMAL HIGH (ref 11.5–15.5)
WBC: 7.8 10*3/uL (ref 4.0–10.5)
nRBC: 0 % (ref 0.0–0.2)

## 2022-10-18 LAB — MAGNESIUM: Magnesium: 2.1 mg/dL (ref 1.7–2.4)

## 2022-10-18 LAB — GLUCOSE, CAPILLARY
Glucose-Capillary: 141 mg/dL — ABNORMAL HIGH (ref 70–99)
Glucose-Capillary: 143 mg/dL — ABNORMAL HIGH (ref 70–99)
Glucose-Capillary: 156 mg/dL — ABNORMAL HIGH (ref 70–99)
Glucose-Capillary: 160 mg/dL — ABNORMAL HIGH (ref 70–99)
Glucose-Capillary: 149 mg/dL — ABNORMAL HIGH (ref 70–99)

## 2022-10-18 LAB — SEDIMENTATION RATE: Sed Rate: 34 mm/hr — ABNORMAL HIGH (ref 0–16)

## 2022-10-18 LAB — C-REACTIVE PROTEIN: CRP: 2.6 mg/dL — ABNORMAL HIGH (ref <1.0)

## 2022-10-18 MED ORDER — METOPROLOL TARTRATE 12.5 MG HALF TABLET
12.5000 mg | ORAL_TABLET | Freq: Two times a day (BID) | ORAL | Status: DC
  Administered 2022-10-18 – 2022-10-21 (×4): 12.5 mg via ORAL
  Filled 2022-10-18 (×5): qty 1

## 2022-10-18 MED ORDER — FINASTERIDE 5 MG PO TABS
5.0000 mg | ORAL_TABLET | Freq: Every day | ORAL | Status: DC
  Administered 2022-10-18 – 2022-10-21 (×4): 5 mg via ORAL
  Filled 2022-10-18 (×4): qty 1

## 2022-10-18 MED ORDER — FERROUS SULFATE 325 (65 FE) MG PO TABS
325.0000 mg | ORAL_TABLET | ORAL | Status: DC
  Administered 2022-10-18 – 2022-10-20 (×2): 325 mg via ORAL
  Filled 2022-10-18 (×2): qty 1

## 2022-10-18 MED ORDER — DIVALPROEX SODIUM 125 MG PO CSDR
125.0000 mg | DELAYED_RELEASE_CAPSULE | Freq: Two times a day (BID) | ORAL | Status: DC
  Administered 2022-10-18 – 2022-10-21 (×7): 125 mg via ORAL
  Filled 2022-10-18 (×7): qty 1

## 2022-10-18 MED ORDER — MEMANTINE HCL 10 MG PO TABS
10.0000 mg | ORAL_TABLET | Freq: Two times a day (BID) | ORAL | Status: DC
  Administered 2022-10-18 – 2022-10-21 (×7): 10 mg via ORAL
  Filled 2022-10-18 (×7): qty 1

## 2022-10-18 MED ORDER — DIPHENHYDRAMINE-ZINC ACETATE 2-0.1 % EX CREA
TOPICAL_CREAM | Freq: Two times a day (BID) | CUTANEOUS | Status: DC | PRN
  Filled 2022-10-18 (×2): qty 28

## 2022-10-18 MED ORDER — TAMSULOSIN HCL 0.4 MG PO CAPS
0.4000 mg | ORAL_CAPSULE | Freq: Every day | ORAL | Status: DC
  Administered 2022-10-18 – 2022-10-21 (×4): 0.4 mg via ORAL
  Filled 2022-10-18 (×4): qty 1

## 2022-10-18 MED ORDER — INSULIN ASPART 100 UNIT/ML IJ SOLN
0.0000 [IU] | Freq: Three times a day (TID) | INTRAMUSCULAR | Status: DC
  Administered 2022-10-18: 1 [IU] via SUBCUTANEOUS
  Administered 2022-10-19: 2 [IU] via SUBCUTANEOUS
  Administered 2022-10-19 – 2022-10-21 (×4): 1 [IU] via SUBCUTANEOUS

## 2022-10-18 MED ORDER — FUROSEMIDE 40 MG PO TABS
40.0000 mg | ORAL_TABLET | Freq: Every day | ORAL | Status: DC
  Administered 2022-10-18 – 2022-10-21 (×4): 40 mg via ORAL
  Filled 2022-10-18 (×4): qty 1

## 2022-10-18 NOTE — Progress Notes (Signed)
Progress Note   Patient: [name] AOZ:[id number] DOB: Aug 04, 1923 DOA: 10/17/2022     1 DOS: the patient was seen and examined on 10/18/2022   Brief hospital course: 89 y.o. male with medical history significant for paroxysmal atrial fibrillation complicated by sick sinus syndrome status post pacemaker placement, chronically anticoagulated on Eliquis, CKD stage IV associated baseline creatinine 2.0-2.3, essential hypertension, type 2 diabetes mellitus, chronic systolic heart failure, anemia of chronic kidney disease associated baseline hemoglobin 9-11, who is admitted to [facility] on 10/17/2022 with cellulitis of the left foot after presenting from home to [facility] ED complaining of left foot pain.   The patient has a known necrotic left great toe, for which he reportedly follows with podiatry as an outpatient. He was recently hospitalized from 10/07/2022 to 10/16/2022 for cardiac tamponade, during which hospitalization he underwent pericardiocentesis. He was ultimately discharged home on 5/29  Assessment and Plan: #) Cellulitis of left foot:  -Noted to have increasing erythema and swelling in the setting of known necrotic toe -Pt is followed by Podiatry as outpatient.  -Plain films reviewed, findings of fracture of the base of the prox phalanx of the great toe medially. No evidence of bone infection noted -Given concerns of possible deeper infection, had consulted Podiatry to follow -Have ordered MRI of foot, pending -Continue IV vancomycin and cefepime per inpatient pharmacy     #) Paroxysmal atrial fibrillation:  -In setting of CHA2DS2-VASc score of  8, there is an indication for chronic anticoagulation for thromboembolic prophylaxis.  -On lopressor.   -Most recent echocardiogram occurred on 10/10/2022 was notable for LVEF 45 to 50%.  -holding Eliquis for now    #) Chronic systolic heart failure:  - most recent echocardiogram performed on 10/10/2022, with results include LVEF  45 to 50% -No clinical evidence to suggest acutely decompensated heart failure at this time.  -Continue home diuretic regimen.     #) CKD Stage  4:  -Attempt to avoid nephrotoxic agents.   -CMP/magnesium level in the AM.     #) Anemia of chronic kidney disease:  -Hemodynamically stable    #) Type 2 Diabetes Mellitus:  -continue SSI as needed.    #) Benign Prostatic Hyperplasia:  - continue home tamsulosin.and finasteride.       Subjective: Without complaints today  Physical Exam: Vitals:   10/18/22 0755 10/18/22 1137 10/18/22 1513 10/18/22 1611  BP: (!) 108/92 92/61 (!) 88/64 108/86  Pulse: (!) 104 87 84 (!) 101  Resp: 20 17 17 18   Temp: 97.7 F (36.5 C) 97.7 F (36.5 C) 98.2 F (36.8 C) 98.1 F (36.7 C)  TempSrc: Oral Oral Oral Oral  SpO2: 100% 100% 94% 98%  Weight:      Height:       General exam: Awake, laying in bed, in nad Respiratory system: Normal respiratory effort, no wheezing Cardiovascular system: regular rate, s1, s2 Gastrointestinal system: Soft, nondistended, positive BS Central nervous system: CN2-12 grossly intact, strength intact Extremities: no clubbing, L foot erythematous, edematous, nontender with bandage over large toe Skin: Normal skin turgor, no notable skin lesions seen Psychiatry: Mood normal // no visual hallucinations   Data Reviewed:  Labs reviewed: Na 139, K 4.3, Cr 2.05, WBC 7.8, Hgb 10.6, Plts 126   Family Communication: Pt in room, family not at bedside  Disposition: Status is: Inpatient Remains inpatient appropriate because: Severity of illness  Planned Discharge Destination: Home    Author: Rickey Barbara, MD 10/18/2022 5:29 PM  For  on call review [email or web address].

## 2022-10-18 NOTE — Consult Note (Signed)
   [facility] CM Inpatient Consult   10/18/2022  Quadir Maffett 1923/09/22 [id number]   [facility] [[facility]]  Accountable Care Organization [ACO] Patient: [employer] [[employer] listed]  Primary Care Provider:  Mliss Sax, MD [address] [facility]  Reason for review:  *Readmitted in less than 7 days with extreme high score for unplanned readmission  Patient is currently active with [facility] [[facility]] Care Management for chronic disease management services.  Patient has been engaged by a [facility] RN Chronic Futures trader.  Our community based plan of care has focused on disease management and community resource support.    Plan: Updates to be sent to [facility] RN CCM for disposition and needs.   Of note, [facility] Care Management services does not replace or interfere with any services that are needed or arranged by inpatient [facility] care management team.   For additional questions or referrals please contact:  Charlesetta Shanks, RN BSN CCM [facility]  [phone] business mobile phone Toll free office [phone]  *Concierge Line  [phone] Fax number: [phone] [email or web address]

## 2022-10-18 NOTE — Transitions of Care (Post Inpatient/ED Visit) (Signed)
   10/18/2022  Name: [name] MRN: [id number] DOB: 05-12-1924  Today's TOC FU Call Status: Today's TOC FU Call Status:: Unsuccessful Call (3rd Attempt) Unsuccessful Call (3rd Attempt) Date: 10/18/22 (patient has been readmitted on 10-17-2022)  Attempted to reach the patient regarding the most recent Inpatient/ED visit. The patient was also readmitted on 10-17-2022. Will continue to monitor for changes.   Follow Up Plan: No further outreach attempts will be made at this time. We have been unable to contact the patient.  Alto Denver RN, MSN, CCM RN Care Manager  Chronic Care Management Direct Number: [phone]

## 2022-10-18 NOTE — Hospital Course (Signed)
89 y.o. male with medical history significant for paroxysmal atrial fibrillation complicated by sick sinus syndrome status post pacemaker placement, chronically anticoagulated on Eliquis, CKD stage IV associated baseline creatinine 2.0-2.3, essential hypertension, type 2 diabetes mellitus, chronic systolic heart failure, anemia of chronic kidney disease associated baseline hemoglobin 9-11, who is admitted to [facility] on 10/17/2022 with cellulitis of the left foot after presenting from home to [facility] ED complaining of left foot pain.   The patient has a known necrotic left great toe, for which he reportedly follows with podiatry as an outpatient. He was recently hospitalized from 10/07/2022 to 10/16/2022 for cardiac tamponade, during which hospitalization he underwent pericardiocentesis. He was ultimately discharged home on 5/29

## 2022-10-18 NOTE — Consult Note (Signed)
PODIATRY CONSULTATION  NAME [name] MRN [id number] DOB [birth year] DOA 10/17/2022   Reason for consult: Cellulitis left foot Chief Complaint  Patient presents with   Leg Pain    Consulting physician: Dr. Rickey Barbara MD, [facility]  History of present illness: 89 y.o. male PMHx paroxysmal A-fib with pacemaker placement on chronic Eliquis, CKD stage IV, T2DM, CHF, PAD last seen by Dr. Lorine Bears, MD Cardiology 09/24/2022 readmitted to the hospital for increased redness with swelling and pain to the left foot.  Patient is being managed at the podiatry clinic with Dr. Lilian Kapur for chronic ulcer of the left great toe.  Podiatry consulted today upon readmission.  MRI left foot pending  Past Medical History:  Diagnosis Date   Acute CVA (cerebrovascular accident) [facility])    Arthritis    Atrial fibrillation ([name])    CKD (chronic kidney disease)    History of hiatal hernia    Hx SBO    Last 2013 all treated conservatively   Hypertension    Presence of permanent cardiac pacemaker    Prostate cancer [facility])    Stroke ([name]) 2017   left facial drooping noted 08/14/2016   Tachycardia-bradycardia syndrome ([name])    Type II diabetes mellitus ([name])    Ventral hernia    x3       Latest Ref Rng & Units 10/18/2022    3:53 AM 10/17/2022   11:10 AM 10/16/2022    1:27 AM  CBC  WBC 4.0 - 10.5 K/uL 7.8  7.0  7.1   Hemoglobin 13.0 - 17.0 g/dL 63.0  16.0  9.7   Hematocrit 39.0 - 52.0 % 31.3  32.5  28.8   Platelets 150 - 400 K/uL 126  176  166        Latest Ref Rng & Units 10/18/2022    3:53 AM 10/17/2022   11:10 AM 10/16/2022    1:27 AM  BMP  Glucose 70 - 99 mg/dL 109  323  557   BUN 8 - 23 mg/dL 38  41  44   Creatinine 0.61 - 1.24 mg/dL 3.22  0.25  4.27   Sodium 135 - 145 mmol/L 139  138  139   Potassium 3.5 - 5.1 mmol/L 4.3  4.5  4.3   Chloride 98 - 111 mmol/L 104  105  104   CO2 22 - 32 mmol/L 19  20  23    Calcium 8.9 - 10.3 mg/dL 7.7  7.9  7.9     LT great toe  10/18/2022  LT foot/leg 10/18/2022   Physical Exam: General: The patient is alert and oriented x3 in no acute distress.   Dermatology: Skin is cold to touch bilateral lower extremities.  Obvious limb ischemia.  Stable ischemic gangrene noted to the plantar aspect of the left great toe.  Please see above noted photo  Vascular: Progress Note Dr. Lorine Bears 09/24/2022 "Peripheral arterial disease with critical limb ischemia and gangrenous first and second left toes.  I suspect that his popliteal and TP trunk stents are likely occluded given the significant drop in his ABI and cold left foot. I explained to patient and family members that this is a limb threatening situation and without revascularization is almost certain that this gangrene will progress.  I have suggested proceeding with angiography and possible endovascular intervention.  However, they are concerned about his age and comorbidities including advanced chronic kidney disease which is understandable. They are going to discuss with his power of  attorney and will let us know if they agree to proceed with the procedure.  Without revascularization, focus should be on comfort."  Neurological: Light touch and protective threshold diminished bilaterally.  No history of peripheral neuropathy  Musculoskeletal Exam: No structural deformity noted.  No prior amputations.  Radiographic exam LT foot 10/17/2022: FINDINGS: Global osteopenia. Well corticated plantar and Achilles calcaneal spurs. Bandage along the great toe. There is a nondisplaced fracture of the medial aspect of the base of the proximal phalanx of the great toe. This could be acute. No additional fracture or dislocation. No definite erosive changes at this time. If there is further concern of bone infection, MRI or bone scan may be useful for further sensitivity IMPRESSION: Fracture of the base of the proximal phalanx of the great toe medially. Fracture line extends to the margin  of the joint space of the first metatarsophalangeal joint.  ASSESSMENT/PLAN OF CARE 1.  Critical limb ischemia bilateral lower extremities 2.  Ischemic gangrenous ulcer plantar aspect left great toe 3.  Acute fracture left great toe proximal phalanx  -Patient evaluated this evening -Spoke with the granddaughter (POA) via telephone.  Patient does relate a history of the patient stubbing his toe recently.  Honestly I don't see much benefit of an MRI for a few reasons.Marland KitchenMarland Kitchen 1) new finding of fracture which can demonstrate bone marrow edema similar to osteomyelitis. 2) regardless of the MRI impression, the granddaughter does not want surgery or toe amputation.  Complicated by critical limb ischemia which does not make the patient a candidate for amputation currently in this the patient is optimized from a vascular standpoint.  Currently amputation or wide debridement of the toe is not an option.  Patient's POA insist that she continues to want the MRI despite the concerns expressed.  -Family seemed very concerned with the patient's lower extremity pain which I think the majority of his LLE pain is coming from ischemia.  Patient was last seen and evaluated for lower extremity circulation on 09/24/2022 with Dr. Kirke Corin.  Recommend reconsulting with vascular -From a podiatry standpoint no surgery. Conservative care.  Betadine wet-to-dry to the toe daily -Podiatry will sign off.  Please reconsult if needed.  Otherwise we will continue to follow peripherally in the office postdischarge    Thank you for the consult.  Please contact me directly via secure chat with any questions or concerns.     Felecia Shelling, DPM [facility]  Dr. Felecia Shelling, DPM    [address] [place], [place] [address]                Office [phone]  Fax [phone]

## 2022-10-19 DIAGNOSIS — L03116 Cellulitis of left lower limb: Secondary | ICD-10-CM | POA: Diagnosis not present

## 2022-10-19 DIAGNOSIS — I739 Peripheral vascular disease, unspecified: Secondary | ICD-10-CM | POA: Diagnosis not present

## 2022-10-19 DIAGNOSIS — M79672 Pain in left foot: Secondary | ICD-10-CM | POA: Diagnosis not present

## 2022-10-19 DIAGNOSIS — S92402A Displaced unspecified fracture of left great toe, initial encounter for closed fracture: Secondary | ICD-10-CM | POA: Diagnosis not present

## 2022-10-19 DIAGNOSIS — S92401A Displaced unspecified fracture of right great toe, initial encounter for closed fracture: Secondary | ICD-10-CM | POA: Diagnosis not present

## 2022-10-19 LAB — GLUCOSE, CAPILLARY
Glucose-Capillary: 125 mg/dL — ABNORMAL HIGH (ref 70–99)
Glucose-Capillary: 201 mg/dL — ABNORMAL HIGH (ref 70–99)
Glucose-Capillary: 163 mg/dL — ABNORMAL HIGH (ref 70–99)
Glucose-Capillary: 251 mg/dL — ABNORMAL HIGH (ref 70–99)

## 2022-10-19 LAB — COMPREHENSIVE METABOLIC PANEL
ALT: 10 U/L (ref 0–44)
AST: 17 U/L (ref 15–41)
Albumin: 2.3 g/dL — ABNORMAL LOW (ref 3.5–5.0)
Alkaline Phosphatase: 49 U/L (ref 38–126)
Anion gap: 10 (ref 5–15)
BUN: 37 mg/dL — ABNORMAL HIGH (ref 8–23)
CO2: 22 mmol/L (ref 22–32)
Calcium: 7.6 mg/dL — ABNORMAL LOW (ref 8.9–10.3)
Chloride: 105 mmol/L (ref 98–111)
Creatinine, Ser: 2.15 mg/dL — ABNORMAL HIGH (ref 0.61–1.24)
GFR, Estimated: 27 mL/min — ABNORMAL LOW (ref >60)
Glucose, Bld: 136 mg/dL — ABNORMAL HIGH (ref 70–99)
Potassium: 4.2 mmol/L (ref 3.5–5.1)
Sodium: 137 mmol/L (ref 135–145)
Total Bilirubin: 0.4 mg/dL (ref 0.3–1.2)
Total Protein: 5.5 g/dL — ABNORMAL LOW (ref 6.5–8.1)

## 2022-10-19 LAB — CBC
HCT: 30.2 % — ABNORMAL LOW (ref 39.0–52.0)
Hemoglobin: 10.3 g/dL — ABNORMAL LOW (ref 13.0–17.0)
MCH: 25.9 pg — ABNORMAL LOW (ref 26.0–34.0)
MCHC: 34.1 g/dL (ref 30.0–36.0)
MCV: 76.1 fL — ABNORMAL LOW (ref 80.0–100.0)
Platelets: 140 10*3/uL — ABNORMAL LOW (ref 150–400)
RBC: 3.97 MIL/uL — ABNORMAL LOW (ref 4.22–5.81)
RDW: 17.5 % — ABNORMAL HIGH (ref 11.5–15.5)
WBC: 7.5 10*3/uL (ref 4.0–10.5)
nRBC: 0 % (ref 0.0–0.2)

## 2022-10-19 MED ORDER — FLUTICASONE PROPIONATE 50 MCG/ACT NA SUSP
1.0000 | Freq: Every day | NASAL | Status: DC
  Administered 2022-10-19 – 2022-10-25 (×7): 1 via NASAL
  Filled 2022-10-19: qty 16

## 2022-10-19 MED ORDER — APIXABAN 2.5 MG PO TABS
2.5000 mg | ORAL_TABLET | Freq: Two times a day (BID) | ORAL | Status: DC
  Administered 2022-10-19 – 2022-10-21 (×5): 2.5 mg via ORAL
  Filled 2022-10-19 (×5): qty 1

## 2022-10-19 MED ORDER — CARBAMIDE PEROXIDE 6.5 % OT SOLN
5.0000 [drp] | Freq: Two times a day (BID) | OTIC | Status: DC
  Administered 2022-10-19 – 2022-10-23 (×9): 5 [drp] via OTIC
  Filled 2022-10-19 (×2): qty 15

## 2022-10-19 NOTE — Progress Notes (Signed)
Progress Note   Patient: [name] ZOX:[id number] DOB: [birth year] DOA: 10/17/2022     2 DOS: the patient was seen and examined on 10/19/2022   Brief hospital course: 89 y.o. male with medical history significant for paroxysmal atrial fibrillation complicated by sick sinus syndrome status post pacemaker placement, chronically anticoagulated on Eliquis, CKD stage IV associated baseline creatinine 2.0-2.3, essential hypertension, type 2 diabetes mellitus, chronic systolic heart failure, anemia of chronic kidney disease associated baseline hemoglobin 9-11, who is admitted to [facility] on 10/17/2022 with cellulitis of the left foot after presenting from home to [facility] ED complaining of left foot pain.   The patient has a known necrotic left great toe, for which he reportedly follows with podiatry as an outpatient. He was recently hospitalized from 10/07/2022 to 10/16/2022 for cardiac tamponade, during which hospitalization he underwent pericardiocentesis. He was ultimately discharged home on 5/29  Assessment and Plan: #) Cellulitis of left foot:  -Noted to have increasing erythema and swelling in the setting of known necrotic toe -Plain films reviewed, findings of fracture of the base of the prox phalanx of the great toe medially. No evidence of bone infection noted -Given concerns of possible deeper infection, had consulted Podiatry to follow -Have ordered MRI of foot, pending -Continued on IV vancomycin and cefepime per inpatient pharmacy -Appreciate input by Podiatry. Not candidate for surgery with Podiatry recommending Cardiology reconsult -Appreciate input by Cardiology. Cardiology has recommended comfort care and medical management. Cardiology to relay this to Dr. Kirke Corin for review as well as peripheral vascular team    #) Paroxysmal atrial fibrillation:  -In setting of CHA2DS2-VASc score of  8, there is an indication for chronic anticoagulation for thromboembolic prophylaxis.   -On lopressor.   -Most recent echocardiogram occurred on 10/10/2022 was notable for LVEF 45 to 50%.  -cont eliquis    #) Chronic systolic heart failure:  - most recent echocardiogram performed on 10/10/2022, with results include LVEF 45 to 50% -No clinical evidence to suggest acutely decompensated heart failure at this time.  -Continue home diuretic regimen.     #) CKD Stage  4:  -Attempt to avoid nephrotoxic agents.   -CMP/magnesium level in the AM.     #) Anemia of chronic kidney disease:  -Hemodynamically stable    #) Type 2 Diabetes Mellitus:  -continue SSI as needed.    #) Benign Prostatic Hyperplasia:  - continue home tamsulosin.and finasteride.       Subjective: Complaining of congestion  Physical Exam: Vitals:   10/19/22 0313 10/19/22 0730 10/19/22 1150 10/19/22 1532  BP: 113/80 107/83 98/76   Pulse: (!) 105 (!) 108 91   Resp: 17 20 20    Temp: 97.6 F (36.4 C) 97.7 F (36.5 C) 97.9 F (36.6 C) 97.6 F (36.4 C)  TempSrc: Oral Oral Oral Oral  SpO2: 97% 100% 97%   Weight:      Height:       General exam: Conversant, in no acute distress Respiratory system: normal chest rise, clear, no audible wheezing Cardiovascular system: regular rhythm, s1-s2 Gastrointestinal system: Nondistended, nontender, pos BS Central nervous system: No seizures, no tremors Extremities: No cyanosis, no joint deformities Skin: No rashes, no pallor Psychiatry: Affect normal // no auditory hallucinations   Data Reviewed:  Labs reviewed: Na 137, K 4.2, Cr 2.15, WBC 7.5, Hgb 10.3   Family Communication: Pt in room, family not at bedside  Disposition: Status is: Inpatient Remains inpatient appropriate because: Severity of illness  Planned Discharge  Destination: Home    Author: Rickey Barbara, MD 10/19/2022 4:45 PM  For on call review [email or web address].

## 2022-10-19 NOTE — Consult Note (Signed)
Cardiology Consultation   Patient ID: [name] MRN: [id number]; DOB: 01/25/1924  Admit date: 10/17/2022 Date of Consult: 10/19/2022  PCP:  Mliss Sax, MD   [facility] Providers Cardiologist:  None  Electrophysiologist:  Lewayne Bunting, MD  {   Patient Profile:   [name] is a 89 y.o. male with a hx of CVA, persistent atrial fibrillation on Eliquis, chronic diastolic heart failure, large pericardial effusion s/p pericardiocentesis 10/09/2022, type 2 diabetes, peripheral arterial disease s/p interventions, tachybradycardia syndrome s/p PPM implant, CKD stage IV, chronic anemia, BPH, dementia, who is being seen 10/19/2022 for the evaluation of critical limb ischemia at the request of Dr Rhona Leavens.  History of Present Illness:   [name] with above PMH who presented to the ER on 10/17/2022 for right lower extremity pain. He has been having increased erythema and pain.   Patient was recently hospitalized here from 10/07/2022 to 10/14/2022 for acute on chronic diastolic heart failure.  Echocardiogram from 10/07/2022 revealed LVEF 50 to 55%, no regional wall motion abnormality, severe LVH, indeterminate diastolic parameter, normal RV, mildly elevated PASP, severe LAE, large pericardial effusion, trivial MR, aortic sclerosis.  He underwent successful pericardiocentesis with 1030 cc fluid removal.  Echo repeated on 10/09/2022 revealed trivial pericardial effusion with normal RV function.  Echo repeated 10/10/2022 with LVEF 45 to 50%, mild to moderate LVH, normal RV, fibrinous and localized pericardial effusion 1.53 cm adjacent to the RA/RV, significantly decreased pericardial effusion. Pericardial drain removed 10/11/2022.  He received IV diuresis and discharged on p.o. Lasix 40 mg daily.  He did have elevated creatinine that peaked at 2.35 and down to 2 at the time of discharge.  Reportedly he was on midodrine for hypotension support, was started on low-dose  metoprolol 12.5 mg twice daily for tachybradycardia syndrome/A-fib.   He follows Dr. Ladona Ridgel outpatient has known history of permanent atrial fibrillation, largely asymptomatic, historically low blood pressure limiting rate control with beta-blocker.  He was anticoagulated on Xarelto.  Due to tachybradycardia syndrome, he had Medtronic single-chamber pacemaker implanted and underwent successful generator change 02/04/2022.  He was last seen by Dr. Ladona Ridgel 05/21/2022, doing well.  Most recent device check was 10/07/2022 revealed normal device function, 11 VHR classified events 4 seconds noted.  He follows Dr. Kirke Corin for peripheral arterial disease since 2018.  Currently he is in his chair, sleeping comfortable.   Past Medical History:  Diagnosis Date   Acute CVA (cerebrovascular accident) [facility])    Arthritis    Atrial fibrillation ([name])    CKD (chronic kidney disease)    History of hiatal hernia    Hx SBO    Last 2013 all treated conservatively   Hypertension    Presence of permanent cardiac pacemaker    Prostate cancer ([name])    Stroke ([name]) 2017   left facial drooping noted 08/14/2016   Tachycardia-bradycardia syndrome ([name])    Type II diabetes mellitus ([name])    Ventral hernia    x3    Past Surgical History:  Procedure Laterality Date   ABDOMINAL AORTOGRAM W/LOWER EXTREMITY N/A 08/07/2016   Procedure: Abdominal Aortogram w/Lower Extremity;  Surgeon: Iran Ouch, MD;  Location: [facility] INVASIVE CV LAB;  Service: Cardiovascular;  Laterality: N/A;   CHOLECYSTECTOMY OPEN  03/31/2001   Dr Wiliam Ke   COLON SURGERY     EXPLORATORY LAPAROTOMY  08/2004   Hattie Perch 10/01/2010   HEMORRHOID SURGERY     HERNIA REPAIR     HIATAL HERNIA REPAIR  2002   INCISIONAL HERNIA REPAIR  08/2004   Hattie Perch 10/01/2010   INSERT / REPLACE / REMOVE PACEMAKER     IR THORACENTESIS ASP PLEURAL SPACE W/IMG GUIDE  07/23/2022   IR THORACENTESIS ASP PLEURAL SPACE W/IMG GUIDE  07/24/2022   IR THORACENTESIS ASP PLEURAL SPACE W/IMG  GUIDE  07/25/2022   PERICARDIOCENTESIS N/A 10/09/2022   Procedure: PERICARDIOCENTESIS;  Surgeon: Corky Crafts, MD;  Location[facility] INVASIVE CV LAB;  Service: Cardiovascular;  Laterality: N/A;   PERIPHERAL VASCULAR BALLOON ANGIOPLASTY  08/07/2016   Procedure: Peripheral Vascular Balloon Angioplasty;  Surgeon: Iran Ouch, MD;  Location: [facility] INVASIVE CV LAB;  Service: Cardiovascular;;  left popliteal artery   PERIPHERAL VASCULAR INTERVENTION  08/14/2016   popliteal artery/notes 08/14/2016   PERIPHERAL VASCULAR INTERVENTION Left 08/14/2016   Procedure: Peripheral Vascular Intervention;  Surgeon: Iran Ouch, MD;  Location: [facility] INVASIVE CV LAB;  Service: Cardiovascular;  Laterality: Left;  popliteal artery   PERMANENT PACEMAKER GENERATOR CHANGE N/A 04/06/2012   Procedure: PERMANENT PACEMAKER GENERATOR CHANGE;  Surgeon: Marinus Maw, MD;  Location: [facility] CATH LAB;  Service: Cardiovascular;  Laterality: N/A;   PPM GENERATOR CHANGEOUT N/A 02/04/2022   Procedure: PPM GENERATOR CHANGEOUT;  Surgeon: Marinus Maw, MD;  Location: [facility] INVASIVE CV LAB;  Service: Cardiovascular;  Laterality: N/A;   SMALL INTESTINE SURGERY  09/16/2004   SBO resection, VH repair     Home Medications:  Prior to Admission medications   Medication Sig Start Date End Date Taking? Authorizing Provider  apixaban (ELIQUIS) 2.5 MG TABS tablet Take 1 tablet (2.5 mg total) by mouth 2 (two) times daily. 07/29/22  Yes Azucena Fallen, MD  divalproex (DEPAKOTE SPRINKLE) 125 MG capsule Take 1 capsule (125 mg total) by mouth 2 (two) times daily. 05/27/22 07/22/23 Yes Mliss Sax, MD  docusate sodium (COLACE) 100 MG capsule Take 1 capsule (100 mg total) by mouth daily. 07/10/22  Yes Amin, Ankit Chirag, MD  finasteride (PROSCAR) 5 MG tablet TAKE 1 TABLET (5 MG TOTAL) BY MOUTH DAILY. 06/07/22  Yes Mliss Sax, MD  furosemide (LASIX) 20 MG tablet Take 2 tablets (40 mg total) by mouth daily. 10/14/22  Yes Zannie Cove, MD  Iron, Ferrous Sulfate, 325 (65 Fe) MG TABS Take 1 tablet every other day. Patient taking differently: Take 1 tablet by mouth every other day. 06/13/22  Yes Mliss Sax, MD  latanoprost (XALATAN) 0.005 % ophthalmic solution Place 1 drop into both eyes at bedtime. 05/03/20  Yes [provider]  loratadine (CLARITIN) 10 MG tablet Take 10 mg by mouth daily as needed for rhinitis.   Yes [provider]  Melatonin 3 MG TBDP Take 1 tablet by mouth at bedtime. Patient taking differently: Take 3 mg by mouth at bedtime. 07/10/22  Yes Amin, Ankit Chirag, MD  memantine (NAMENDA) 10 MG tablet Take 1 tablet (10 mg total) by mouth 2 (two) times daily. 08/22/22  Yes Micki Riley, MD  metoprolol tartrate (LOPRESSOR) 25 MG tablet Take 0.5 tablets (12.5 mg total) by mouth 2 (two) times daily. 10/17/22  Yes Mliss Sax, MD  midodrine (PROAMATINE) 5 MG tablet Take 1 tablet (5 mg total) by mouth 3 (three) times daily with meals. 10/14/22  Yes Zannie Cove, MD  pantoprazole (PROTONIX) 40 MG tablet TAKE 1 TABLET(40 MG) BY MOUTH DAILY Patient taking differently: Take 40 mg by mouth daily. 01/01/22  Yes Mliss Sax, MD  tamsulosin (FLOMAX) 0.4 MG CAPS capsule Take 1  capsule (0.4 mg total) by mouth daily. 12/04/21  Yes Mliss Sax, MD    Inpatient Medications: Scheduled Meds:  apixaban  2.5 mg Oral BID   carbamide peroxide  5 drop Both EARS BID   divalproex  125 mg Oral BID   ferrous sulfate  325 mg Oral Q48H   finasteride  5 mg Oral Daily   fluticasone  1 spray Each Nare Daily   furosemide  40 mg Oral Daily   insulin aspart  0-6 Units Subcutaneous TID WC   memantine  10 mg Oral BID   metoprolol tartrate  12.5 mg Oral BID   tamsulosin  0.4 mg Oral Daily   Continuous Infusions:  ceFEPime (MAXIPIME) IV Stopped (10/18/22 2209)   vancomycin     PRN Meds: acetaminophen **OR** acetaminophen, diphenhydrAMINE-zinc acetate, fentaNYL (SUBLIMAZE)  injection, melatonin, naLOXone (NARCAN)  injection, ondansetron (ZOFRAN) IV  Allergies:   No Known Allergies  Social History:   Social History   Socioeconomic History   Marital status: Widowed    Spouse name: Not on file   Number of children: Not on file   Years of education: Not on file   Highest education level: Not on file  Occupational History   Not on file  Tobacco Use   Smoking status: Former    Packs/day: 1.50    Years: 15.00    Additional pack years: 0.00    Total pack years: 22.50    Types: Cigarettes    Quit date: 04/16/1955    Years since quitting: 67.5    Passive exposure: Past   Smokeless tobacco: Former    Types: Chew    Quit date: 08/17/1952  Vaping Use   Vaping Use: Never used  Substance and Sexual Activity   Alcohol use: No    Comment: 08/14/2016 "drank beer when I was a teenager"   Drug use: No   Sexual activity: Not Currently  Other Topics Concern   Not on file  Social History Narrative   Lives alone   Social Determinants of Health   Financial Resource Strain: Low Risk  (08/19/2022)   Overall Financial Resource Strain (CARDIA)    Difficulty of Paying Living Expenses: Not hard at all  Food Insecurity: No Food Insecurity (10/09/2022)   Hunger Vital Sign    Worried About Running Out of Food in the Last Year: Never true    Ran Out of Food in the Last Year: Never true  Transportation Needs: No Transportation Needs (10/09/2022)   PRAPARE - Administrator, Civil Service (Medical): No    Lack of Transportation (Non-Medical): No  Physical Activity: Sufficiently Active (08/19/2022)   Exercise Vital Sign    Days of Exercise per Week: 5 days    Minutes of Exercise per Session: 30 min  Stress: No Stress Concern Present (08/19/2022)   [employer] of Occupational Health - Occupational Stress Questionnaire    Feeling of Stress : Only a little  Social Connections: Moderately Isolated (08/19/2022)   Social Connection and Isolation Panel [NHANES]     Frequency of Communication with Friends and Family: More than three times a week    Frequency of Social Gatherings with Friends and Family: More than three times a week    Attends Religious Services: More than 4 times per year    Active Member of [employer] or Organizations: No    Attends Banker Meetings: Never    Marital Status: Widowed  Intimate Partner Violence: Not At Risk (10/09/2022)  Humiliation, Afraid, Rape, and Kick questionnaire    Fear of Current or Ex-Partner: No    Emotionally Abused: No    Physically Abused: No    Sexually Abused: No    Family History:    Family History  Problem Relation Age of Onset   Pneumonia Father    Stroke Brother    Stroke Sister    Cancer Brother        type unknown     ROS:  Please see the history of present illness.  Unable All other ROS reviewed and negative.     Physical Exam/Data:   Vitals:   10/18/22 2307 10/19/22 0313 10/19/22 0730 10/19/22 1150  BP: 116/81 113/80 107/83 98/76  Pulse: 95 (!) 105 (!) 108 91  Resp: 20 17 20 20   Temp: (!) 97.5 F (36.4 C) 97.6 F (36.4 C) 97.7 F (36.5 C) 97.9 F (36.6 C)  TempSrc: Oral Oral Oral Oral  SpO2: 100% 97% 100% 97%  Weight:      Height:        Intake/Output Summary (Last 24 hours) at 10/19/2022 1441 Last data filed at 10/19/2022 0735 Gross per 24 hour  Intake 100 ml  Output 800 ml  Net -700 ml      10/18/2022    3:24 AM 10/17/2022    5:29 PM 10/16/2022    3:58 AM  Last 3 Weights  Weight (lbs) 140 lb 6.9 oz 136 lb 11 oz 137 lb 2 oz  Weight (kg) 63.7 kg 62 kg 62.2 kg     Body mass index is 19.59 kg/m.  General: Elderly, sitting in chair sleeping HEENT: normal, cotton in his ear Neck: no JVD Vascular: No carotid bruits; Distal pulses 2+ bilaterally Cardiac:  normal S1, S2; irregular irregular mildly tachycardic; no murmur  Lungs:  clear to auscultation bilaterally, no wheezing, rhonchi or rales  Abd: soft, nontender, no hepatomegaly  Ext: Minimal lower  extremity edema Musculoskeletal: Left foot/great toe noted, necrotic ulcer \ skin: Cool, stable ischemic gangrene Neuro:  CNs 2-12 intact, no focal abnormalities noted Psych:  Normal affect   EKG:  The EKG was personally reviewed and demonstrates: Atrial fibrillation Telemetry:  Telemetry was personally reviewed and demonstrates: Atrial fibrillation heart rate low 100s  Relevant CV Studies: Cardiac Studies & Procedures   CARDIAC CATHETERIZATION  CARDIAC CATHETERIZATION 10/09/2022  Narrative   Successful pericardiocentesis with 1030 cc fluid removal.  Fluid was serosanguinous and somewhat dark.  Will leave drain in place overnight.  Would hopefully be able to remove tomorrow based on drainage.  Results conveyed to granddaughter.     ECHOCARDIOGRAM  ECHOCARDIOGRAM LIMITED 10/10/2022  Narrative ECHOCARDIOGRAM LIMITED REPORT    Patient Name:   [name] Date of Exam: 10/10/2022 Medical Rec #:  [id number]             Height:       71.0 in Accession #:    [id number]            Weight:       139.6 lb Date of Birth:  [birth year]            BSA:          1.810 m Patient Age:    89 years              BP:           84/67 mmHg Patient Gender: M  HR:           83 bpm. Exam Location:  Inpatient  Procedure: Limited Echo  Indications:    Pericardial Effusion, recheck with pericardial drain in place.  History:        Patient has prior history of Echocardiogram examinations, most recent 10/09/2022.  Sonographer:    Irving Burton Senior RDCS Referring Phys: Thomasene Ripple   Sonographer Comments: Technically difficult due to thin body habitus. IMPRESSIONS   1. Technically difficult study with limited visualization of cardiac structures. 2. The LV is not well visualized, however, on limited views appears to have mild-moderate LVH with mildly reduced systolic function (EF 45%-50%). 3. Right ventricular systolic function was not well visualized, however, appears  normal. 4. There is a fibrinous and localized pericardial effusion measuring 1.53cm adjacent to the RA/RV. The IVC now measures 1.53 cm. There are no obvious signs of tamponade. Compared to prior echocardiogram (10/09/22) there has been significant decrease in the effusion.  FINDINGS Left Ventricle: Left ventricular endocardial border not optimally defined to evaluate regional wall motion.  Right Ventricle: Right ventricular systolic function was not well visualized.  Left Atrium: Left atrial size was not well visualized.  Pericardium: Fibrinous pericardial effusion measuring up to 1.53cm. A moderately sized pericardial effusion is present. The pericardial effusion appears to contain fibrous material.  Mitral Valve: The mitral valve was not assessed.  Tricuspid Valve: The tricuspid valve is not assessed.  Aortic Valve: The aortic valve was not assessed.  Pulmonic Valve: The pulmonic valve was not assessed.  Aorta: Aortic root could not be assessed.  Venous: The inferior vena cava is normal in size with greater than 50% respiratory variability, suggesting right atrial pressure of 3 mmHg.  Additional Comments: A device lead is visualized.  Dorthula Nettles Electronically signed by Dorthula Nettles Signature Date/Time: 10/10/2022/3:27:36 PM    Final              Laboratory Data:  High Sensitivity Troponin:   Recent Labs  Lab 10/07/22 1110 10/07/22 1249  TROPONINIHS 26* 27*     Chemistry Recent Labs  Lab 10/13/22 0120 10/14/22 0126 10/16/22 0127 10/17/22 1110 10/18/22 0353 10/19/22 0215  NA 135 138   < > 138 139 137  K 4.1 4.7   < > 4.5 4.3 4.2  CL 104 104   < > 105 104 105  CO2 22 23   < > 20* 19* 22  GLUCOSE 191* 142*   < > 173* 159* 136*  BUN 38* 38*   < > 41* 38* 37*  CREATININE 2.11* 2.00*   < > 2.07* 2.05* 2.15*  CALCIUM 7.3* 7.4*   < > 7.9* 7.7* 7.6*  MG 2.1 2.1  --   --  2.1  --   GFRNONAA 28* 30*   < > 29* 29* 27*  ANIONGAP 9 11   < > 13 16* 10    < > = values in this interval not displayed.    Recent Labs  Lab 10/13/22 0120 10/18/22 0353 10/19/22 0215  PROT  --  5.4* 5.5*  ALBUMIN 2.1* 2.5* 2.3*  AST  --  17 17  ALT  --  8 10  ALKPHOS  --  51 49  BILITOT  --  0.5 0.4   Lipids No results for input(s): "CHOL", "TRIG", "HDL", "LABVLDL", "LDLCALC", "CHOLHDL" in the last 168 hours.  Hematology Recent Labs  Lab 10/17/22 1110 10/18/22 0353 10/19/22 0215  WBC 7.0 7.8 7.5  RBC 4.14* 4.25  3.97*  HGB 10.3* 10.6* 10.3*  HCT 32.5* 31.3* 30.2*  MCV 78.5* 73.6* 76.1*  MCH 24.9* 24.9* 25.9*  MCHC 31.7 33.9 34.1  RDW 18.2* 17.3* 17.5*  PLT 176 126* 140*   Thyroid No results for input(s): "TSH", "FREET4" in the last 168 hours.  BNPNo results for input(s): "BNP", "PROBNP" in the last 168 hours.  DDimer No results for input(s): "DDIMER" in the last 168 hours.   Radiology/Studies:  VAS [place] LOWER EXTREMITY VENOUS (DVT) (ONLY [facility])  Result Date: 10/18/2022  Lower Venous DVT Study Patient Name:  [name] [facility]  Date of Exam:   10/17/2022 Medical Rec #: [id number]              Accession #:    [id number] Date of Birth: 01/31/1924             Patient Gender: M Patient Age:   67 years Exam Location:  [facility] Procedure:      VAS [place] LOWER EXTREMITY VENOUS (DVT) Referring Phys: Lynden Oxford --------------------------------------------------------------------------------  Indications: Leg pain- patient endorses right lateral thigh pain, also with ulceration and discoloration to left leg.  Risk Factors: History of PAD with popliteal artery stent placement in 2018. Limitations: Poor ultrasound/tissue interface. Comparison Study: No prior studies. Performing Technologist: Jean Rosenthal RDMS, RVT  Examination Guidelines: A complete evaluation includes B-mode imaging, spectral Doppler, color Doppler, and power Doppler as needed of all accessible portions of each vessel. Bilateral testing is considered an integral part of a complete  examination. Limited examinations for reoccurring indications may be performed as noted. The reflux portion of the exam is performed with the patient in reverse Trendelenburg.  +---------+---------------+---------+-----------+----------+--------------+ RIGHT    CompressibilityPhasicitySpontaneityPropertiesThrombus Aging +---------+---------------+---------+-----------+----------+--------------+ CFV      Full           Yes      Yes                                 +---------+---------------+---------+-----------+----------+--------------+ SFJ      Full                                                        +---------+---------------+---------+-----------+----------+--------------+ FV Prox  Full                                                        +---------+---------------+---------+-----------+----------+--------------+ FV Mid   Full                                                        +---------+---------------+---------+-----------+----------+--------------+ FV DistalFull                                                        +---------+---------------+---------+-----------+----------+--------------+ PFV  Full                                                        +---------+---------------+---------+-----------+----------+--------------+ POP      Full           Yes      Yes                                 +---------+---------------+---------+-----------+----------+--------------+ PTV      Full                                                        +---------+---------------+---------+-----------+----------+--------------+ PERO     Full                                                        +---------+---------------+---------+-----------+----------+--------------+   +---------+---------------+---------+-----------+----------+--------------+ LEFT     CompressibilityPhasicitySpontaneityPropertiesThrombus Aging  +---------+---------------+---------+-----------+----------+--------------+ CFV      Full           Yes      Yes                                 +---------+---------------+---------+-----------+----------+--------------+ SFJ      Full                                                        +---------+---------------+---------+-----------+----------+--------------+ FV Prox  Full                                                        +---------+---------------+---------+-----------+----------+--------------+ FV Mid   Full                                                        +---------+---------------+---------+-----------+----------+--------------+ FV DistalFull                                                        +---------+---------------+---------+-----------+----------+--------------+ PFV      Full                                                        +---------+---------------+---------+-----------+----------+--------------+  POP      Full           Yes      Yes                                 +---------+---------------+---------+-----------+----------+--------------+ PTV      Full                                                        +---------+---------------+---------+-----------+----------+--------------+ PERO     Full                                                        +---------+---------------+---------+-----------+----------+--------------+     Summary: RIGHT: - There is no evidence of deep vein thrombosis in the lower extremity.  - No cystic structure found in the popliteal fossa.  - Incidental: Triphasic PTA, monophasic DPA.  LEFT: - There is no evidence of deep vein thrombosis in the lower extremity.  - No cystic structure found in the popliteal fossa.  - Incidental: Occlusion of distal segment of popliteal artery stent with recanalized, monophasic flow distally in the PTA and DPA.  *See table(s) above for measurements and  observations. Electronically signed by Sherald Hess MD on 10/18/2022 at 11:30:28 AM.    Final    DG Foot Complete Left  Result Date: 10/17/2022 CLINICAL DATA:  Left great toe wound.  Redness.  Pain EXAM: LEFT FOOT - COMPLETE 3 VIEW COMPARISON:  None Available. FINDINGS: Global osteopenia. Well corticated plantar and Achilles calcaneal spurs. Bandage along the great toe. There is a nondisplaced fracture of the medial aspect of the base of the proximal phalanx of the great toe. This could be acute. No additional fracture or dislocation. No definite erosive changes at this time. If there is further concern of bone infection, MRI or bone scan may be useful for further sensitivity IMPRESSION: Fracture of the base of the proximal phalanx of the great toe medially. Fracture line extends to the margin of the joint space of the first metatarsophalangeal joint. Osteopenia.  Calcaneal spurs. Bandage along the great toe Electronically Signed   By: Karen Kays M.D.   On: 10/17/2022 18:49     Assessment and Plan:   Peripheral arterial disease with critical limb ischemia gangrenous first and second left toes - He is not a candidate for further invasive procedures.  Appreciate Dr. Logan Bores with podiatry speaking with granddaughter.  Granddaughter does not wish surgery or toe amputation. -Recommend comfort care.  Medical management. -Patient is DNR  Will relay to Dr. Kirke Corin for his review as well with our peripheral vascular team.  Previously seen and evaluated on 09/24/2022 with Dr. Kirke Corin.   For questions or updates, please contact [facility] Please consult [email or web address] for contact info under    Signed, Donato Schultz, MD

## 2022-10-20 DIAGNOSIS — I70262 Atherosclerosis of native arteries of extremities with gangrene, left leg: Secondary | ICD-10-CM | POA: Diagnosis not present

## 2022-10-20 DIAGNOSIS — S92401A Displaced unspecified fracture of right great toe, initial encounter for closed fracture: Secondary | ICD-10-CM | POA: Diagnosis not present

## 2022-10-20 DIAGNOSIS — S92402A Displaced unspecified fracture of left great toe, initial encounter for closed fracture: Secondary | ICD-10-CM | POA: Diagnosis not present

## 2022-10-20 DIAGNOSIS — Z7189 Other specified counseling: Secondary | ICD-10-CM | POA: Diagnosis not present

## 2022-10-20 DIAGNOSIS — L03116 Cellulitis of left lower limb: Secondary | ICD-10-CM | POA: Diagnosis not present

## 2022-10-20 DIAGNOSIS — M79672 Pain in left foot: Secondary | ICD-10-CM | POA: Diagnosis not present

## 2022-10-20 LAB — CBC
HCT: 28.9 % — ABNORMAL LOW (ref 39.0–52.0)
Hemoglobin: 9.9 g/dL — ABNORMAL LOW (ref 13.0–17.0)
MCH: 25.6 pg — ABNORMAL LOW (ref 26.0–34.0)
MCHC: 34.3 g/dL (ref 30.0–36.0)
MCV: 74.9 fL — ABNORMAL LOW (ref 80.0–100.0)
Platelets: 186 10*3/uL (ref 150–400)
RBC: 3.86 MIL/uL — ABNORMAL LOW (ref 4.22–5.81)
RDW: 17.3 % — ABNORMAL HIGH (ref 11.5–15.5)
WBC: 7.5 10*3/uL (ref 4.0–10.5)
nRBC: 0 % (ref 0.0–0.2)

## 2022-10-20 LAB — COMPREHENSIVE METABOLIC PANEL
ALT: 10 U/L (ref 0–44)
AST: 14 U/L — ABNORMAL LOW (ref 15–41)
Albumin: 2.1 g/dL — ABNORMAL LOW (ref 3.5–5.0)
Alkaline Phosphatase: 47 U/L (ref 38–126)
Anion gap: 10 (ref 5–15)
BUN: 37 mg/dL — ABNORMAL HIGH (ref 8–23)
CO2: 22 mmol/L (ref 22–32)
Calcium: 7.7 mg/dL — ABNORMAL LOW (ref 8.9–10.3)
Chloride: 104 mmol/L (ref 98–111)
Creatinine, Ser: 1.97 mg/dL — ABNORMAL HIGH (ref 0.61–1.24)
GFR, Estimated: 31 mL/min — ABNORMAL LOW (ref >60)
Glucose, Bld: 192 mg/dL — ABNORMAL HIGH (ref 70–99)
Potassium: 4.2 mmol/L (ref 3.5–5.1)
Sodium: 136 mmol/L (ref 135–145)
Total Bilirubin: 0.6 mg/dL (ref 0.3–1.2)
Total Protein: 5.3 g/dL — ABNORMAL LOW (ref 6.5–8.1)

## 2022-10-20 LAB — GLUCOSE, CAPILLARY
Glucose-Capillary: 151 mg/dL — ABNORMAL HIGH (ref 70–99)
Glucose-Capillary: 156 mg/dL — ABNORMAL HIGH (ref 70–99)
Glucose-Capillary: 102 mg/dL — ABNORMAL HIGH (ref 70–99)
Glucose-Capillary: 195 mg/dL — ABNORMAL HIGH (ref 70–99)

## 2022-10-20 MED ORDER — MIDODRINE HCL 5 MG PO TABS
5.0000 mg | ORAL_TABLET | Freq: Three times a day (TID) | ORAL | Status: DC
  Administered 2022-10-20 – 2022-10-21 (×4): 5 mg via ORAL
  Filled 2022-10-20 (×4): qty 1

## 2022-10-20 MED ORDER — DOCUSATE SODIUM 100 MG PO CAPS
100.0000 mg | ORAL_CAPSULE | Freq: Every day | ORAL | Status: DC
  Administered 2022-10-20 – 2022-10-21 (×2): 100 mg via ORAL
  Filled 2022-10-20 (×2): qty 1

## 2022-10-20 MED ORDER — PANTOPRAZOLE SODIUM 40 MG PO TBEC
40.0000 mg | DELAYED_RELEASE_TABLET | Freq: Every day | ORAL | Status: DC
  Administered 2022-10-20 – 2022-10-21 (×2): 40 mg via ORAL
  Filled 2022-10-20 (×2): qty 1

## 2022-10-20 NOTE — Progress Notes (Signed)
Progress Note   Patient: [name] ZOX:[id number] DOB: [birth year] DOA: 10/17/2022     3 DOS: the patient was seen and examined on 10/20/2022   Brief hospital course: 89 y.o. male with medical history significant for paroxysmal atrial fibrillation complicated by sick sinus syndrome status post pacemaker placement, chronically anticoagulated on Eliquis, CKD stage IV associated baseline creatinine 2.0-2.3, essential hypertension, type 2 diabetes mellitus, chronic systolic heart failure, anemia of chronic kidney disease associated baseline hemoglobin 9-11, who is admitted to [facility] on 10/17/2022 with cellulitis of the left foot after presenting from home to [facility] ED complaining of left foot pain.   The patient has a known necrotic left great toe, for which he reportedly follows with podiatry as an outpatient. He was recently hospitalized from 10/07/2022 to 10/16/2022 for cardiac tamponade, during which hospitalization he underwent pericardiocentesis. He was ultimately discharged home on 5/29  Assessment and Plan: #) Cellulitis of left foot:  -Noted to have increasing erythema and swelling in the setting of known necrotic toe -Plain films reviewed, findings of fracture of the base of the prox phalanx of the great toe medially. No evidence of bone infection noted -Given concerns of possible deeper infection, had consulted Podiatry to follow -Have ordered MRI of foot, remains pending -Continued on IV vancomycin and cefepime per inpatient pharmacy -Appreciate input by Podiatry. Not candidate for surgery with Podiatry recommending Cardiology reconsult -Appreciate input by Cardiology. Cardiology has recommended comfort care and medical management. Cardiology to relay this to Dr. Kirke Corin for review as well as peripheral vascular team -Have consulted Palliative Care, appreciate assistance    #) Paroxysmal atrial fibrillation:  -In setting of CHA2DS2-VASc score of  8, there is an indication  for chronic anticoagulation for thromboembolic prophylaxis.  -continue lopressor -Most recent echocardiogram occurred on 10/10/2022 was notable for LVEF 45 to 50%.  -cont eliquis    #) Chronic systolic heart failure:  - most recent echocardiogram performed on 10/10/2022, with results include LVEF 45 to 50% -No clinical evidence to suggest acutely decompensated heart failure at this time.  -Continue home diuretic regimen.     #) CKD Stage  4:  -Attempt to avoid nephrotoxic agents.   -CMP/magnesium level in the AM.     #) Anemia of chronic kidney disease:  -Hemodynamically stable    #) Type 2 Diabetes Mellitus:  -continue SSI as needed.    #) Benign Prostatic Hyperplasia:  - continue home tamsulosin.and finasteride.  #) Hypotension  -Had been on midodrine PTA, will continue      Subjective: States hearing is "back to normal" after a trial of debrox ear drops  Physical Exam: Vitals:   10/19/22 2239 10/20/22 0336 10/20/22 0740 10/20/22 1152  BP: 101/76 91/66 96/66  (!) 86/68  Pulse: 90 96 98 100  Resp: (!) 21 15 15 16   Temp: 98 F (36.7 C) 97.6 F (36.4 C) 97.9 F (36.6 C) 97.7 F (36.5 C)  TempSrc: Oral Axillary Oral Oral  SpO2: 100% 100% 100% 100%  Weight:      Height:       General exam: Awake, laying in bed, in nad Respiratory system: Normal respiratory effort, no wheezing Cardiovascular system: regular rate, s1, s2 Gastrointestinal system: Soft, nondistended, positive BS Central nervous system: CN2-12 grossly intact, strength intact Extremities: no clubbing, necrotic large toe Skin: Normal skin turgor, no notable skin lesions seen Psychiatry: Mood normal // no visual hallucinations   Data Reviewed:  Labs reviewed: Na 136, K 4.2, Cr 1.97,  WBC 7.5, Hgb 9.9, Plts 186   Family Communication: Pt in room, family over phone  Disposition: Status is: Inpatient Remains inpatient appropriate because: Severity of illness  Planned Discharge Destination:  Home    Author: Rickey Barbara, MD 10/20/2022 3:15 PM  For on call review [email or web address].

## 2022-10-20 NOTE — Consult Note (Addendum)
Consultation Note Date: 10/20/2022   Patient Name: [name]  DOB: February 23, 1924  MRN: [id number]  Age / Sex: 89 y.o., male  PCP: Mliss Sax, MD Referring Physician: Jerald Kief, MD  Reason for Consultation: Goals of care  HPI/Patient Profile: 89 y.o. male  with past medical history of peripheral artery disease, CHF, dementia, afib, tachybrady syndrome s/p pacmaker, CKD IV, recently admitted 5/20-5/28 with volume overload resulting in pericardial infusion (cardiocentesis with 1033 cc off), L toe gangrene, discharged home on 5/28 and readmitted on 10/17/2022 with L lower extremity pain. Workup reveals cellulitis and critical limb ischemia. Recommendations per vascular, podiatry and cardiology have been comfort focused. He is being treated with antibiotics and MRI is pending. Palliative consulted for further goals of care.    Primary Decision Maker HCPOA - Granddaughter [name]- HCPOA document is on chart  Discussion: Chart reviewed including labs, progress notes, imaging from this and previous encounters.   Patient sitting up in chair, asleep, lunch tray untouched. Attempted to wake him, but he did not wake to my voice.  Patient has MOST on chart completed in February 2024 with following choices:    Cardiopulmonary Resuscitation: Do Not Attempt Resuscitation (DNR/No CPR)  Medical Interventions: Limited Additional Interventions: Use medical treatment, IV fluids and cardiac monitoring as indicated, DO NOT USE intubation or mechanical ventilation. May consider use of less invasive airway support such as BiPAP or CPAP. Also provide comfort measures. Transfer to the hospital if indicated. Avoid intensive care.   Antibiotics: Antibiotics if indicated  IV Fluids: IV fluids if indicated  Feeding Tube: No feeding tube    Called and spoke with his Granddaughter/HCPOA- [name].   Introduced Palliative medicine. Palliative medicine is specialized medical care for people living with serious illness. It focuses on providing relief from the symptoms and stress of a serious illness. The goal is to improve quality of life for both the patient and the family. [name] is familiar with palliative medicine as patient receives home Palliative visits from [facility].  [name] reports her understanding of patient's status is that the goal is to control his pain. She is waiting on his MRI to be completed.  She notes that since Thursday he has felt much better and "has been fine".    SUMMARY OF RECOMMENDATIONS -Continue current plan of care -Granddaughter awaiting MRI -Pain currently well controlled -Plan to meet tomorrow at 4pm with [name] in person for further discussion  Code Status/Advance Care Planning: DNR   Prognosis:   Unable to determine  Discharge Planning: To Be Determined  Primary Diagnoses: Present on Admission:  Cellulitis of left lower extremity  Paroxysmal atrial fibrillation ([name])  Chronic systolic CHF (congestive heart failure) ([name])  CKD (chronic kidney disease) stage 4, GFR 15-29 ml/min ([name])  BPH (benign prostatic hyperplasia)   Review of Systems  Unable to perform ROS   Physical Exam Vitals and nursing note reviewed.  Neurological:     Comments: sleeping     Vital Signs: BP (!) 86/68 (BP  Location: Right Arm)   Pulse 100   Temp 97.7 F (36.5 C) (Oral)   Resp 16   Ht 5\' 11"  (1.803 m)   Wt 63.7 kg   SpO2 100%   BMI 19.59 kg/m  Pain Scale: 0-10   Pain Score: 0-No pain   SpO2: SpO2: 100 % O2 Device:SpO2: 100 % O2 Flow Rate: .   IO: Intake/output summary:  Intake/Output Summary (Last 24 hours) at 10/20/2022 1423 Last data filed at 10/20/2022 0859 Gross per 24 hour  Intake 300 ml  Output 250 ml  Net 50 ml    LBM: Last BM Date : 10/18/22 Baseline Weight: Weight: 62 kg Most recent weight: Weight: 63.7 kg        Thank you for this consult. Palliative medicine will continue to follow and assist as needed.  Time Total: 80 minutes Greater than 50%  of this time was spent counseling and coordinating care related to the above assessment and plan.  Signed by: Ocie Bob, AGNP-C Palliative Medicine    Please contact Palliative Medicine Team phone at [phone] for questions and concerns.  For individual provider: See Loretha Stapler

## 2022-10-21 ENCOUNTER — Inpatient Hospital Stay (HOSPITAL_COMMUNITY): Payer: No Typology Code available for payment source

## 2022-10-21 DIAGNOSIS — Z8719 Personal history of other diseases of the digestive system: Secondary | ICD-10-CM | POA: Diagnosis not present

## 2022-10-21 DIAGNOSIS — Z7189 Other specified counseling: Secondary | ICD-10-CM | POA: Diagnosis not present

## 2022-10-21 DIAGNOSIS — I70262 Atherosclerosis of native arteries of extremities with gangrene, left leg: Secondary | ICD-10-CM | POA: Diagnosis not present

## 2022-10-21 DIAGNOSIS — R1084 Generalized abdominal pain: Secondary | ICD-10-CM | POA: Diagnosis not present

## 2022-10-21 DIAGNOSIS — M79672 Pain in left foot: Secondary | ICD-10-CM | POA: Diagnosis not present

## 2022-10-21 DIAGNOSIS — L03116 Cellulitis of left lower limb: Secondary | ICD-10-CM | POA: Diagnosis not present

## 2022-10-21 LAB — GLUCOSE, CAPILLARY
Glucose-Capillary: 155 mg/dL — ABNORMAL HIGH (ref 70–99)
Glucose-Capillary: 130 mg/dL — ABNORMAL HIGH (ref 70–99)
Glucose-Capillary: 126 mg/dL — ABNORMAL HIGH (ref 70–99)
Glucose-Capillary: 139 mg/dL — ABNORMAL HIGH (ref 70–99)
Glucose-Capillary: 145 mg/dL — ABNORMAL HIGH (ref 70–99)

## 2022-10-21 MED ORDER — GLYCERIN (LAXATIVE) 2 G RE SUPP
0.5000 | Freq: Once | RECTAL | Status: AC
  Administered 2022-10-21: 0.5 via RECTAL
  Filled 2022-10-21: qty 1

## 2022-10-21 MED ORDER — POLYETHYLENE GLYCOL 3350 17 G PO PACK
17.0000 g | PACK | Freq: Two times a day (BID) | ORAL | Status: DC
  Administered 2022-10-21 – 2022-10-23 (×3): 17 g via ORAL
  Filled 2022-10-21 (×3): qty 1

## 2022-10-21 MED ORDER — METOPROLOL TARTRATE 5 MG/5ML IV SOLN
2.5000 mg | Freq: Four times a day (QID) | INTRAVENOUS | Status: DC
  Administered 2022-10-21 – 2022-10-26 (×16): 2.5 mg via INTRAVENOUS
  Filled 2022-10-21 (×15): qty 5

## 2022-10-21 MED ORDER — VALPROATE SODIUM 100 MG/ML IV SOLN
125.0000 mg | Freq: Two times a day (BID) | INTRAVENOUS | Status: DC
  Administered 2022-10-21 – 2022-10-25 (×9): 125 mg via INTRAVENOUS
  Filled 2022-10-21 (×10): qty 1.25

## 2022-10-21 MED ORDER — HEPARIN (PORCINE) 25000 UT/250ML-% IV SOLN
1000.0000 [IU]/h | INTRAVENOUS | Status: DC
  Administered 2022-10-21: 900 [IU]/h via INTRAVENOUS
  Administered 2022-10-24: 1000 [IU]/h via INTRAVENOUS
  Filled 2022-10-21 (×3): qty 250

## 2022-10-21 MED ORDER — FENTANYL 12 MCG/HR TD PT72
1.0000 | MEDICATED_PATCH | TRANSDERMAL | Status: DC
  Administered 2022-10-21: 1 via TRANSDERMAL
  Filled 2022-10-21: qty 1

## 2022-10-21 MED ORDER — PANTOPRAZOLE SODIUM 40 MG IV SOLR
40.0000 mg | INTRAVENOUS | Status: DC
  Administered 2022-10-21 – 2022-10-25 (×5): 40 mg via INTRAVENOUS
  Filled 2022-10-21 (×5): qty 10

## 2022-10-21 MED ORDER — FENTANYL CITRATE PF 50 MCG/ML IJ SOSY
25.0000 ug | PREFILLED_SYRINGE | INTRAMUSCULAR | Status: DC | PRN
  Administered 2022-10-22 – 2022-10-24 (×11): 25 ug via INTRAVENOUS
  Filled 2022-10-21 (×11): qty 1

## 2022-10-21 NOTE — Progress Notes (Signed)
14 FR NG tube inserted to left nare with markings at 60 cm marked with tape, help in place with a bridle. Suctioning to low-intermittent suction per order. Some GI content seen in suctioning tube after insertion. Pt voices some relief and prn fentanyl adm to help with pt's pain. Reported off to oncoming RN. Dionne Bucy RN

## 2022-10-21 NOTE — Progress Notes (Deleted)
Rounding Note    Patient Name: [name] Date of Encounter: 10/21/2022  [facility] Cardiologist: None  PV Dr. Kirke Corin EP Dr. Ladona Ridgel  Subjective   No chest pain or SOB no complaints of foot pain now  Inpatient Medications    Scheduled Meds:  apixaban  2.5 mg Oral BID   carbamide peroxide  5 drop Both EARS BID   divalproex  125 mg Oral BID   docusate sodium  100 mg Oral Daily   ferrous sulfate  325 mg Oral Q48H   finasteride  5 mg Oral Daily   fluticasone  1 spray Each Nare Daily   furosemide  40 mg Oral Daily   insulin aspart  0-6 Units Subcutaneous TID WC   memantine  10 mg Oral BID   metoprolol tartrate  12.5 mg Oral BID   midodrine  5 mg Oral TID WC   pantoprazole  40 mg Oral Daily   tamsulosin  0.4 mg Oral Daily   Continuous Infusions:  ceFEPime (MAXIPIME) IV Stopped (10/20/22 2222)   vancomycin Stopped (10/19/22 2335)   PRN Meds: acetaminophen **OR** acetaminophen, diphenhydrAMINE-zinc acetate, fentaNYL (SUBLIMAZE) injection, melatonin, naLOXone (NARCAN)  injection, ondansetron (ZOFRAN) IV   Vital Signs    Vitals:   10/21/22 0730 10/21/22 0735 10/21/22 0740 10/21/22 0800  BP:   104/70 (!) 112/95  Pulse:    94  Resp: 15 15 (!) 22 15  Temp:   97.6 F (36.4 C) 97.7 F (36.5 C)  TempSrc:   Oral Oral  SpO2:      Weight:      Height:        Intake/Output Summary (Last 24 hours) at 10/21/2022 0927 Last data filed at 10/21/2022 1610 Gross per 24 hour  Intake 100 ml  Output 250 ml  Net -150 ml      10/21/2022    4:41 AM 10/18/2022    3:24 AM 10/17/2022    5:29 PM  Last 3 Weights  Weight (lbs) 140 lb 6.9 oz 140 lb 6.9 oz 136 lb 11 oz  Weight (kg) 63.7 kg 63.7 kg 62 kg      Telemetry    Atrial fib with occ V pacing - Personally Reviewed  ECG    No new - Personally Reviewed  Physical Exam   GEN: No acute distress.   Neck: No JVD sitting up in chair Cardiac: orreg orreg, no murmurs, rubs, or gallops.  Respiratory: Clear to  auscultation bilaterally. GI: Soft, nontender, non-distended  MS: Tr edema; No deformity. Toes as before Neuro:  Nonfocal  Psych: Normal affect   Labs    High Sensitivity Troponin:   Recent Labs  Lab 10/07/22 1110 10/07/22 1249  TROPONINIHS 26* 27*     Chemistry Recent Labs  Lab 10/18/22 0353 10/19/22 0215 10/20/22 0252  NA 139 137 136  K 4.3 4.2 4.2  CL 104 105 104  CO2 19* 22 22  GLUCOSE 159* 136* 192*  BUN 38* 37* 37*  CREATININE 2.05* 2.15* 1.97*  CALCIUM 7.7* 7.6* 7.7*  MG 2.1  --   --   PROT 5.4* 5.5* 5.3*  ALBUMIN 2.5* 2.3* 2.1*  AST 17 17 14*  ALT 8 10 10   ALKPHOS 51 49 47  BILITOT 0.5 0.4 0.6  GFRNONAA 29* 27* 31*  ANIONGAP 16* 10 10    Lipids No results for input(s): "CHOL", "TRIG", "HDL", "LABVLDL", "LDLCALC", "CHOLHDL" in the last 168 hours.  Hematology Recent Labs  Lab 10/18/22  1610 10/19/22 0215 10/20/22 0252  WBC 7.8 7.5 7.5  RBC 4.25 3.97* 3.86*  HGB 10.6* 10.3* 9.9*  HCT 31.3* 30.2* 28.9*  MCV 73.6* 76.1* 74.9*  MCH 24.9* 25.9* 25.6*  MCHC 33.9 34.1 34.3  RDW 17.3* 17.5* 17.3*  PLT 126* 140* 186   Thyroid No results for input(s): "TSH", "FREET4" in the last 168 hours.  BNPNo results for input(s): "BNP", "PROBNP" in the last 168 hours.  DDimer No results for input(s): "DDIMER" in the last 168 hours.   Radiology    No results found.  Cardiac Studies   Cardiac Studies & Procedures   CARDIAC CATHETERIZATION   CARDIAC CATHETERIZATION 10/09/2022   Narrative   Successful pericardiocentesis with 1030 cc fluid removal.  Fluid was serosanguinous and somewhat dark.   Will leave drain in place overnight.  Would hopefully be able to remove tomorrow based on drainage.   Results conveyed to granddaughter.     ECHOCARDIOGRAM   ECHOCARDIOGRAM LIMITED 10/10/2022   Narrative ECHOCARDIOGRAM LIMITED REPORT       Patient Name:   Amante Kosek Date of Exam: 10/10/2022 Medical Rec #:  [id number]             Height:       71.0  in Accession #:    [id number]            Weight:       139.6 lb Date of Birth:  [birth year]            BSA:          1.810 m Patient Age:    89 years              BP:           84/67 mmHg Patient Gender: M                     HR:           83 bpm. Exam Location:  Inpatient   Procedure: Limited Echo   Indications:    Pericardial Effusion, recheck with pericardial drain in place.   History:        Patient has prior history of Echocardiogram examinations, most recent 10/09/2022.   Sonographer:    Irving Burton Senior RDCS Referring Phys: Thomasene Ripple     Sonographer Comments: Technically difficult due to thin body habitus. IMPRESSIONS     1. Technically difficult study with limited visualization of cardiac structures. 2. The LV is not well visualized, however, on limited views appears to have mild-moderate LVH with mildly reduced systolic function (EF 45%-50%). 3. Right ventricular systolic function was not well visualized, however, appears normal. 4. There is a fibrinous and localized pericardial effusion measuring 1.53cm adjacent to the RA/RV. The IVC now measures 1.53 cm. There are no obvious signs of tamponade. Compared to prior echocardiogram (10/09/22) there has been significant decrease in the effusion.   FINDINGS Left Ventricle: Left ventricular endocardial border not optimally defined to evaluate regional wall motion.   Right Ventricle: Right ventricular systolic function was not well visualized.   Left Atrium: Left atrial size was not well visualized.   Pericardium: Fibrinous pericardial effusion measuring up to 1.53cm. A moderately sized pericardial effusion is present. The pericardial effusion appears to contain fibrous material.   Mitral Valve: The mitral valve was not assessed.   Tricuspid Valve: The tricuspid valve is not assessed.   Aortic Valve: The aortic valve was not assessed.   Pulmonic  Valve: The pulmonic valve was not assessed.   Aorta: Aortic root could not be  assessed.   Venous: The inferior vena cava is normal in size with greater than 50% respiratory variability, suggesting right atrial pressure of 3 mmHg.   Additional Comments: A device lead is visualized.   Aditya Sabharwal Electronically signed by Dorthula Nettles Signature Date/Time: 10/10/2022/3:27:36 PM    Patient Profile     89 y.o. male  hx of CVA, permanent atrial fibrillation on Eliquis, chronic diastolic heart failure, large pericardial effusion s/p pericardiocentesis 10/09/2022, type 2 diabetes, peripheral arterial disease s/p interventions, tachybradycardia syndrome s/p PPM implant, CKD stage IV, chronic anemia, BPH, dementia, admitted 10/16/32 now with critical limb ischemia.  Assessment & Plan    PAD with critical limb ischemia gangrenous 1st and 2nd toes - He is not a candidate for further invasive procedures.  Appreciate Dr. Logan Bores with podiatry speaking with granddaughter.  Granddaughter does not wish surgery or toe amputation. -Recommend comfort care.  Medical management. -Patient is DNR --He underwent recent Doppler studies 09/16/22 which showed normal ABI on the right and moderately reduced on the left at 0.57.  His ABI was was previously normal on the left.  Great toe pressure was only 28.   Last seen by Dr. Kirke Corin 09/24/22 per his note  "I suspect that his popliteal and TP trunk stents are likely occluded given the significant drop in his ABI and cold left foot.   I explained to patient and family members that this is a limb threatening situation and without revascularization is almost certain that this gangrene will progress.  I have suggested proceeding with angiography and possible endovascular intervention.  However, they are concerned about his age and comorbidities including advanced chronic kidney disease which is understandable. They are going to discuss with his power of attorney and will let us know if they agree to proceed with the procedure.  Without  revascularization, focus should be on comfort."   Chronic HFpEF  with recent hospitalization -diuresed last admit -- euvolemic and neg 803 wt stable  Pericardial effusion with recent pericardiocentesis with follow up echo and improvement --will need 4-6 weeks limited echo to f/u pericardial effusion  Permanent atrial fib rate controlled on eliquis, and metoprolol  rate 108 yesterday  Hx PPM stable  CKD stage 4/DM/BPH/hypotension on midodrine per IM      For questions or updates, please contact [facility] Please consult [email or web address] for contact info under        Signed, Nada Boozer, NP  10/21/2022, 9:27 AM

## 2022-10-21 NOTE — Progress Notes (Signed)
Physical Therapy Evaluation Patient Details Name: [name] MRN: [id number] DOB: [birth year] Today's Date: 10/21/2022  History of Present Illness  89 yo male presenting to the ED with pain in left 1st toe. +fracture base of prox phalanx. MRI foot pending. Previously admitted 5/20 with SOb and pericardial effusion. 5/22 pericardiocentesis. PMhx: dementia, HTN, CHF, Afib, PPM, CVA, PAD, CKD, T2DM  Clinical Impression   Pt admitted secondary to problem above with deficits below. PTA patient was living at home with family and caregivers (per recent hospitalization). He was ambulatory with RW. Pt currently requires min assist for bed mobility, up to mod assist for sit to stand, and min assist for ambulation x25 ft with RW. Noted no weight-bearing restrictions for Lt foot and MRI pending.  Anticipate patient will benefit from PT to address problems listed below.Will continue to follow acutely to maximize functional mobility independence and safety.          Recommendations for follow up therapy are one component of a multi-disciplinary discharge planning process, led by the attending physician.  Recommendations may be updated based on patient status, additional functional criteria and insurance authorization.  Follow Up Recommendations       Assistance Recommended at Discharge Frequent or constant Supervision/Assistance  Patient can return home with the following  A little help with walking and/or transfers;A lot of help with bathing/dressing/bathroom;Assistance with cooking/housework;Direct supervision/assist for medications management;Direct supervision/assist for financial management;Assist for transportation;Help with stairs or ramp for entrance    Equipment Recommendations None recommended by PT  Recommendations for Other Services       Functional Status Assessment Patient has had a recent decline in their functional status and demonstrates the ability to make significant  improvements in function in a reasonable and predictable amount of time.     Precautions / Restrictions Precautions Precautions: Fall Precaution Comments: HOH, talk into R ear Restrictions Weight Bearing Restrictions: No      Mobility  Bed Mobility Overal bed mobility: Needs Assistance Bed Mobility: Supine to Sit     Supine to sit: Supervision, HOB elevated     General bed mobility comments: incr time, no physical assist needed    Transfers Overall transfer level: Needs assistance Equipment used: Rolling walker (2 wheels) Transfers: Sit to/from Stand Sit to Stand: Min assist           General transfer comment: bed at lowest height; assist for vertical translation    Ambulation/Gait Ambulation/Gait assistance: Min guard Gait Distance (Feet): 25 Feet Assistive device: Rolling walker (2 wheels) Gait Pattern/deviations: Step-through pattern, Decreased stride length Gait velocity: reduced     General Gait Details: small steps; limited by HR up to 133 bpm  Stairs            Wheelchair Mobility    Modified Rankin (Stroke Patients Only)       Balance Overall balance assessment: Needs assistance Sitting-balance support: No upper extremity supported, Feet supported Sitting balance-Leahy Scale: Fair     Standing balance support: Bilateral upper extremity supported, Reliant on assistive device for balance Standing balance-Leahy Scale: Poor Standing balance comment: RW in standing                             Pertinent Vitals/Pain Pain Assessment Pain Assessment: No/denies pain    Home Living Family/patient expects to be discharged to:: Private residence Living Arrangements: Non-relatives/Friends Available Help at Discharge: Personal care attendant;Available 24 hours/day;Family Type of Home: House Home  Access: Ramped entrance       Home Layout: One level Home Equipment: Cane - single point;Rolling Walker (2 wheels);BSC/3in1;Wheelchair  - manual;Hospital bed Additional Comments: information obtained from recent evaluation on 5/27.    Prior Function Prior Level of Function : Needs assist             Mobility Comments: RW at all times, min assist for transfers ADLs Comments: Pt requires assist for ADL's at baseline level, has 24 hour care with nursing     Hand Dominance   Dominant Hand: Right    Extremity/Trunk Assessment   Upper Extremity Assessment Upper Extremity Assessment: Defer to OT evaluation    Lower Extremity Assessment Lower Extremity Assessment: Generalized weakness    Cervical / Trunk Assessment Cervical / Trunk Assessment: Kyphotic  Communication   Communication: HOH  Cognition Arousal/Alertness: Awake/alert Behavior During Therapy: Flat affect Overall Cognitive Status: History of cognitive impairments - at baseline                                          General Comments      Exercises     Assessment/Plan    PT Assessment Patient needs continued PT services  PT Problem List Decreased strength;Decreased activity tolerance;Decreased balance;Decreased mobility;Decreased knowledge of use of DME;Cardiopulmonary status limiting activity       PT Treatment Interventions DME instruction;Gait training;Functional mobility training;Therapeutic activities;Therapeutic exercise;Balance training;Neuromuscular re-education;Patient/family education    PT Goals (Current goals can be found in the Care Plan section)  Acute Rehab PT Goals Patient Stated Goal: none stated PT Goal Formulation: Patient unable to participate in goal setting Time For Goal Achievement: 11/04/22 Potential to Achieve Goals: Good    Frequency Min 3X/week     Co-evaluation               AM-PAC PT "6 Clicks" Mobility  Outcome Measure Help needed turning from your back to your side while in a flat bed without using bedrails?: A Little Help needed moving from lying on your back to sitting on  the side of a flat bed without using bedrails?: A Little Help needed moving to and from a bed to a chair (including a wheelchair)?: A Little Help needed standing up from a chair using your arms (e.g., wheelchair or bedside chair)?: A Lot Help needed to walk in hospital room?: A Little Help needed climbing 3-5 steps with a railing? : A Lot 6 Click Score: 16    End of Session Equipment Utilized During Treatment: Gait belt Activity Tolerance: Treatment limited secondary to medical complications (Comment) (tachycardia) Patient left: in chair;with call bell/phone within reach;with chair alarm set Nurse Communication: Mobility status PT Visit Diagnosis: Muscle weakness (generalized) (M62.81)    Time: 1610-9604 PT Time Calculation (min) (ACUTE ONLY): 19 min   Charges:   PT Evaluation $PT Eval Low Complexity: 1 Low           Jerolyn Center, PT Acute Rehabilitation Services  Office [phone]   Zena Amos 10/21/2022, 10:43 AM

## 2022-10-21 NOTE — Progress Notes (Signed)
Progress Note   Patient: [name] ZOX:[id number] DOB: [birth year] DOA: 10/17/2022     4 DOS: the patient was seen and examined on 10/21/2022   Brief hospital course: 89 y.o. male with medical history significant for paroxysmal atrial fibrillation complicated by sick sinus syndrome status post pacemaker placement, chronically anticoagulated on Eliquis, CKD stage IV associated baseline creatinine 2.0-2.3, essential hypertension, type 2 diabetes mellitus, chronic systolic heart failure, anemia of chronic kidney disease associated baseline hemoglobin 9-11, who is admitted to [facility] on 10/17/2022 with cellulitis of the left foot after presenting from home to [facility] ED complaining of left foot pain.   The patient has a known necrotic left great toe, for which he reportedly follows with podiatry as an outpatient. He was recently hospitalized from 10/07/2022 to 10/16/2022 for cardiac tamponade, during which hospitalization he underwent pericardiocentesis. He was ultimately discharged home on 5/29  Assessment and Plan: #) Cellulitis of left foot with peripheral artery disease -Pt had been followed by Dr. Kirke Corin PTA for PAD, recently with concern for occlusion at popliteal and TP trunk stents -Noted to have increasing erythema and swelling in the setting of known necrotic toe -Plain films reviewed, findings of fracture of the base of the prox phalanx of the great toe medially. No evidence of bone infection noted -Given concerns of possible deeper infection, had consulted Podiatry to follow -Have ordered MRI of foot, remains pending -Family had been contemplating proceeding angiography with possible intervention PTA -Continued on IV vancomycin and cefepime per inpatient pharmacy -Appreciate input by Podiatry. Not candidate for surgery with Podiatry recommending Cardiology reconsult -Appreciate input by Cardiology. Cardiology has recommended comfort care and medical management. Cardiology to  relay this to Dr. Kirke Corin for review as well as peripheral vascular team  -Palliative Care to meet w/ family later this afternoon to discuss GOC    #) Paroxysmal atrial fibrillation:  -In setting of CHA2DS2-VASc score of  8, there is an indication for chronic anticoagulation for thromboembolic prophylaxis.  -continue lopressor -Most recent echocardiogram occurred on 10/10/2022 was notable for LVEF 45 to 50%.  -cont eliquis    #) Chronic systolic heart failure:  - most recent echocardiogram performed on 10/10/2022, with results include LVEF 45 to 50% -No clinical evidence to suggest acutely decompensated heart failure at this time.  -Continue home diuretic regimen.     #) CKD Stage  4:  -Attempt to avoid nephrotoxic agents.   -CMP/magnesium level in the AM.     #) Anemia of chronic kidney disease:  -Hemodynamically stable    #) Type 2 Diabetes Mellitus:  -continue SSI as needed.    #) Benign Prostatic Hyperplasia:  - continue home tamsulosin.and finasteride.  #) Hypotension  -Had been on midodrine PTA, continued      Subjective: Wants to sleep and be left alone this AM  Physical Exam: Vitals:   10/21/22 0740 10/21/22 0800 10/21/22 1211 10/21/22 1312  BP: 104/70 (!) 112/95 94/68 92/68   Pulse:  94 85 98  Resp: (!) 22 15 13 15   Temp: 97.6 F (36.4 C) 97.7 F (36.5 C) (!) 97.5 F (36.4 C) (!) 97.5 F (36.4 C)  TempSrc: Oral Oral Oral Oral  SpO2:   96% 97%  Weight:      Height:       General exam: Conversant, in no acute distress Respiratory system: normal chest rise, clear, no audible wheezing Cardiovascular system: regular rhythm, s1-s2 Gastrointestinal system: Nondistended, nontender, pos BS Central nervous system: No seizures,  no tremors Extremities: No cyanosis, no joint deformities Skin: No rashes, no pallor Psychiatry: Affect normal // no auditory hallucinations   Data Reviewed:  There are no new results to review at this time.  Family Communication: Pt in  room, family not at bedside  Disposition: Status is: Inpatient Remains inpatient appropriate because: Severity of illness  Planned Discharge Destination: Home    Author: Rickey Barbara, MD 10/21/2022 3:07 PM  For on call review [email or web address].

## 2022-10-21 NOTE — Progress Notes (Addendum)
Fentanyl patch applied to pt's RIGHT chest and covered with clear Tegaderm dsg.  Dionne Bucy RN

## 2022-10-21 NOTE — Treatment Plan (Addendum)
Reviewed abd xray with radiologist. Findings concerning for partial SBO. Per RN, pt appears quite uncomfortable and distended right now. Will place NG to decompress. Keep NPO. Will order abd/pelvic CT to further eval. Change PO meds to IV if possible. Will change eliquis to heparin gtt in the interim  Updated patient's granddaughter who is aware and agrees with plan

## 2022-10-21 NOTE — Progress Notes (Signed)
ANTICOAGULATION CONSULT NOTE  Pharmacy Consult for Heparin Indication: atrial fibrillation  No Known Allergies  Patient Measurements: Height: 5\' 11"  (180.3 cm) Weight: 63.7 kg (140 lb 6.9 oz) IBW/kg (Calculated) : 75.3 Heparin Dosing Weight: 62 kg  Vital Signs: Temp: 97 F (36.1 C) (06/03 1801) Temp Source: Oral (06/03 1801) BP: 100/74 (06/03 1801) Pulse Rate: 97 (06/03 1801)  Labs: Recent Labs    10/19/22 0215 10/20/22 0252  HGB 10.3* 9.9*  HCT 30.2* 28.9*  PLT 140* 186  CREATININE 2.15* 1.97*    Estimated Creatinine Clearance: 19.8 mL/min (A) (by C-G formula based on SCr of 1.97 mg/dL (H)).   Medical History: Past Medical History:  Diagnosis Date   Acute CVA (cerebrovascular accident) [facility])    Arthritis    Atrial fibrillation ([name])    CKD (chronic kidney disease)    History of hiatal hernia    Hx SBO    Last 2013 all treated conservatively   Hypertension    Presence of permanent cardiac pacemaker    Prostate cancer ([name])    Stroke ([name]) 2017   left facial drooping noted 08/14/2016   Tachycardia-bradycardia syndrome ([name])    Type II diabetes mellitus ([name])    Ventral hernia    x3    Assessment: 89 YOM admitted for cellulitis now with concern for SBO on abdominal Xray. Plan for NG decompression and abd/pelvic CT for further eval. Eliquis transitioned to heparin while NPO.  Last Eliquis dose 6/3 @0841  - will use aPTT to monitor until levels are correlating  Goal of Therapy:  Heparin level 0.3-0.7 units/ml aPTT 66-102 seconds Monitor platelets by anticoagulation protocol: Yes   Plan:  Start heparin at 900 units/hr at 2200 without bolus given recent Eliquis use Check 8hr aPTT Daily CBC and heparin level/aPTT Follow for ability to switch back to Eliquis  Ellis Savage, PharmD Clinical Pharmacist 10/21/2022,7:31 PM

## 2022-10-21 NOTE — Progress Notes (Signed)
Transition of Care [facility]) - Inpatient Brief Assessment   Patient Details  Name: [name] MRN: [id number] Date of Birth: 04-22-1924  Transition of Care [facility]) CM/SW Contact:    Darrold Span, RN Phone Number: 10/21/2022, 2:40 PM   Clinical Narrative: Pt admitted with cellulitis and gangrene of LE- PC to meet with pt and family for GOC- pt is followed by [facility] in the home for Palliative care.  Transition of Care Department [facility]) has reviewed patient we will continue to monitor patient advancement through interdisciplinary progression rounds. If new patient transition needs arise, please place a TOC consult.   Transition of Care Asessment: Insurance and Status: Insurance coverage has been reviewed Patient has primary care physician: Yes Home environment has been reviewed: from home Prior level of function:: [facility] Prior/Current Home Services: Current home services (Active with [facility]- HHRN/PT/OT/ST) Social Determinants of Health Reivew: SDOH reviewed no interventions necessary Readmission risk has been reviewed: Yes Transition of care needs: transition of care needs identified, TOC will continue to follow

## 2022-10-21 NOTE — Progress Notes (Signed)
Daily Progress Note   Patient Name: [name]       Date: 10/21/2022 DOB: Jul 20, 1923  Age: 89 y.o. MRN#: [id number] Attending Physician: Jerald Kief, MD Primary Care Physician: Mliss Sax, MD Admit Date: 10/17/2022  Reason for Consultation/Follow-up: Establishing goals of care  Patient Profile/HPI:  89 y.o. male  with past medical history of peripheral artery disease, CHF, dementia, afib, tachybrady syndrome s/p pacmaker, CKD IV, recently admitted 5/20-5/28 with volume overload resulting in pericardial infusion (cardiocentesis with 1033 cc off), L toe gangrene, discharged home on 5/28 and readmitted on 10/17/2022 with L lower extremity pain. Workup reveals cellulitis and critical limb ischemia. Recommendations per vascular, podiatry and cardiology have been comfort focused. He is being treated with antibiotics and MRI is pending. Palliative consulted for further goals of care.    Subjective: Chart reviewed including labs, progress notes, imaging from this and previous encounters.  On evaluation patient was awake, but in a great deal of pain related to his abdomen. His abdomen was significantly distended- per RN this is a change from yesterday.  Met with Granddaughter [name], caregiver [name] and grandson in Social worker- [name].  [name] has cared for patient every since her mother died.  She describes him as loving, faithful, attends church regularly, enjoys guiding the youth in his community, enjoys telling stories and joking, and working in his garden.  We discussed his current chronic and acute illnesses and trajectories.  Hospice services and philosophy of care were reviewed and discussed in light of his critical limb ischemia and lack of interventions to correct.  [name] is  agreeable to hospice at discharge- he is currently active with Palliative at home.  Given his history of small bowel obstructions related to his hernias I notified his attending provider. [name] would like to proceed with KUB and treatment with NG tube that he has had in the past if he does have a recurrent SBO.    Review of Systems  Constitutional:  Positive for malaise/fatigue.  Gastrointestinal:  Positive for abdominal pain.     Physical Exam Vitals and nursing note reviewed.  Abdominal:     General: There is distension.     Tenderness: There is abdominal tenderness.     Comments: Multiple Palpable abdominal hernias  Vital Signs: BP 101/82 (BP Location: Right Arm)   Pulse (!) 117   Temp (!) 97.5 F (36.4 C)   Resp 16   Ht 5\' 11"  (1.803 m)   Wt 63.7 kg   SpO2 (!) 81%   BMI 19.59 kg/m  SpO2: SpO2: (!) 81 % O2 Device: O2 Device: Room Air O2 Flow Rate:    Intake/output summary:  Intake/Output Summary (Last 24 hours) at 10/21/2022 1715 Last data filed at 10/21/2022 0454 Gross per 24 hour  Intake 100 ml  Output 250 ml  Net -150 ml   LBM: Last BM Date : 10/21/22 Baseline Weight: Weight: 62 kg Most recent weight: Weight: 63.7 kg       Palliative Assessment/Data: PPS: 30%      Patient Active Problem List   Diagnosis Date Noted   Chronic systolic CHF (congestive heart failure) ([name]) 10/18/2022   Cellulitis of left lower extremity 10/17/2022   Pericardial effusion 10/08/2022   CHF (congestive heart failure) ([name]) 10/07/2022   Gangrene of toe of left foot ([name]) 10/07/2022   LVH (left ventricular hypertrophy) 09/11/2022   Coronary atherosclerosis 08/15/2022   Encounter for fitting and adjustment of hearing aid 08/15/2022   Hearing loss 08/15/2022   Hypertensive heart disease with heart failure ([name]) 08/15/2022   Muscle weakness (generalized) 08/15/2022   Need for assistance with personal care 08/15/2022   Encounter for issue of other medical certificate  08/15/2022   Encounter for other administrative examinations 08/15/2022   Other specified hearing loss, bilateral 08/15/2022   Pain in joint, lower leg 08/15/2022   Problem related to unspecified psychosocial circumstances 08/15/2022   Cellulitis and abscess of toe of left foot 08/08/2022   Left leg cellulitis 08/08/2022   Bilateral pleural effusion 07/22/2022   DNR (do not resuscitate)/DNI(Do Not Intubate) 07/22/2022   Edema due to hypoalbuminemia 07/22/2022   Chronic hypoxic respiratory failure, on home oxygen therapy ([name]) - 4 L/min 07/22/2022   Pressure injury of skin 07/10/2022   Essential hypertension 07/06/2022   Alzheimer disease ([name]) 07/06/2022   Pleural effusion 07/06/2022   History of CVA (cerebrovascular accident) 07/06/2022   Ventral hernia 07/06/2022   Ulcer of lower extremity, limited to breakdown of skin ([name]) 05/30/2022   Urinary frequency 02/14/2022   Hypotension due to drugs 02/07/2022   Need for influenza vaccination 02/07/2022   History of anemia due to chronic kidney disease 12/31/2021   Agitation due to dementia ([name]) 12/25/2021   BPH (benign prostatic hyperplasia) 12/05/2021   Lower extremity edema 12/05/2021   Scrotum swelling 12/05/2021   Elevated homocysteine 11/30/2021   Low magnesium level 11/30/2021   Balanitis 11/30/2021   Gastroesophageal reflux disease 10/22/2021   Urinary tract infection 10/22/2021   Chronic atrial fibrillation with RVR ([name]) 07/30/2021   Hypophosphatemia 07/29/2021   Stage 3b chronic kidney disease ([name]) 04/25/2021   Continuous leakage of urine 04/25/2021   At high risk for injury related to fall 04/25/2021   Hypocalcemia 10/30/2020   Vitamin D deficiency 10/30/2020   Edema due to malnutrition ([name]) 10/30/2020   Unsteady gait when walking 09/21/2020   Sarcopenia 09/21/2020   B12 deficiency 01/27/2020   Acute on chronic diastolic CHF (congestive heart failure) ([name]) 03/09/2019   Pain in both feet 11/17/2018   Neuropathy  10/09/2018   Insomnia 10/09/2018   Slow transit constipation 06/18/2018   Iron deficiency anemia 11/25/2017   Decreased appetite 10/16/2017   Hearing difficulty of both ears 10/16/2017   Allergic rhinitis due to pollen 09/03/2017  Asymmetric SNHL (sensorineural hearing loss) 08/14/2017   Tinnitus aurium, left 08/14/2017   Ventricular tachycardia ([name]) 01/14/2017   Malnutrition of moderate degree 01/13/2017   Exertional dyspnea 01/12/2017   Hypertension    PVD (peripheral vascular disease) ([name]) 08/14/2016   CVA (cerebral infarction) 07/23/2015   Microcytic anemia 07/23/2015   Stroke (cerebrum) ([name]) 07/23/2015   CKD (chronic kidney disease) stage 4, GFR 15-29 ml/min ([name]) 07/23/2015   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 03/15/2015   Primary open-angle glaucoma, left eye, indeterminate stage 03/15/2015   PCO (posterior capsule opacification) 11/29/2014   Ptosis of eyelid 07/27/2012   POAG (primary open-angle glaucoma) 11/27/2011   Trichiasis 11/27/2011   PPM-[employer] 03/13/2010   DM2 (diabetes mellitus, type 2) ([name]) 11/14/2008   Paroxysmal atrial fibrillation ([name]) 11/14/2008   BRADYCARDIA-TACHYCARDIA SYNDROME s/p PPM 11/14/2008    Palliative Care Assessment & Plan    Assessment/Recommendations/Plan  TOC referral for hospice referral- pt active with [facility] Start fentanyl patch q3 days Continue IV fentanyl q2 hr prn for abdominal pain and leg pain Notified attending of significant abdominal pain and distention in setting of history of small bowel obstructions- will folllow   Code Status: DNR  Prognosis:  < 6 months due to critical limb ischemia with gangrene without option for revascularization and no amputation  Discharge Planning: Home with Hospice  Care plan was discussed with patient's HCPOA- [name] and attending team  Thank you for allowing the Palliative Medicine Team to assist in the care of this patient.  Total time:  85  minutes  Greater than 50%  of this time was spent counseling and coordinating care related to the above assessment and plan.  Ocie Bob, AGNP-C Palliative Medicine   Please contact Palliative Medicine Team phone at [phone] for questions and concerns.

## 2022-10-21 NOTE — Progress Notes (Signed)
Pharmacy Antibiotic Note  [name] is a 89 y.o. male for which pharmacy has been consulted for cefepime and vancomycin dosing for cellulitis. SCr down to 1.97.  Plan: Cefepime 2g IV q24h Continue vancomycin 1g IV q48h. Goal AUC 400-550. Expected AUC: 524 SCr used: 1.97 Monitor clinical progress, c/s, renal function F/u de-escalation plan/LOT, vancomycin levels as indicated    Height: 5\' 11"  (180.3 cm) Weight: 63.7 kg (140 lb 6.9 oz) IBW/kg (Calculated) : 75.3  Temp (24hrs), Avg:97.6 F (36.4 C), Min:97.4 F (36.3 C), Max:97.8 F (36.6 C)  Recent Labs  Lab 10/16/22 0127 10/17/22 1110 10/17/22 1726 10/17/22 1858 10/18/22 0353 10/19/22 0215 10/20/22 0252  WBC 7.1 7.0  --   --  7.8 7.5 7.5  CREATININE 2.28* 2.07*  --   --  2.05* 2.15* 1.97*  LATICACIDVEN  --   --  2.2* 1.8  --   --   --      Estimated Creatinine Clearance: 19.8 mL/min (A) (by C-G formula based on SCr of 1.97 mg/dL (H)).    No Known Allergies  Leia Alf, PharmD, BCPS Please check AMION for all [facility] contact numbers Clinical Pharmacist 10/21/2022 2:35 PM

## 2022-10-21 NOTE — Evaluation (Signed)
Occupational Therapy Evaluation Patient Details Name: [name] MRN: [id number] DOB: 12/17/1923 Today's Date: 10/21/2022   History of Present Illness 89 yo male presenting to the ED with pain in left 1st toe. +fracture base of prox phalanx. MRI foot pending. Previously admitted 5/20 with SOb and pericardial effusion. 5/22 pericardiocentesis. PMhx: dementia, HTN, CHF, Afib, PPM, CVA, PAD, CKD, T2DM   Clinical Impression   Prior to this admission, patient living at home, with personal care attendant and family support (24/7 support stated in chart). All information taken from chart as patient is very hard of hearing, and there was no family present during evaluation. Currently, patient is presenting with decreased activity tolerance, need for min A to complete functional mobility and transfers. and moderate assist to complete ADLs. Patient noted to have Vtach up to 138 BPM with ambulation, however non-sustaining. OT recommending HHOT at discharge as patient has 24/7 support at home. OT will continue to follow.      Recommendations for follow up therapy are one component of a multi-disciplinary discharge planning process, led by the attending physician.  Recommendations may be updated based on patient status, additional functional criteria and insurance authorization.   Assistance Recommended at Discharge Frequent or constant Supervision/Assistance  Patient can return home with the following A little help with walking and/or transfers;A lot of help with bathing/dressing/bathroom;Assistance with cooking/housework;Direct supervision/assist for medications management;Direct supervision/assist for financial management;Assist for transportation;Help with stairs or ramp for entrance    Functional Status Assessment  Patient has had a recent decline in their functional status and demonstrates the ability to make significant improvements in function in a reasonable and predictable amount of time.   Equipment Recommendations  None recommended by OT (patient has equipment needed)    Recommendations for Other Services       Precautions / Restrictions Precautions Precautions: Fall Precaution Comments: HOH, talk into R ear Restrictions Weight Bearing Restrictions: No      Mobility Bed Mobility Overal bed mobility: Needs Assistance Bed Mobility: Supine to Sit     Supine to sit: Supervision, HOB elevated     General bed mobility comments: incr time, no physical assist needed    Transfers Overall transfer level: Needs assistance Equipment used: Rolling walker (2 wheels) Transfers: Sit to/from Stand Sit to Stand: Min assist           General transfer comment: bed at lowest height; assist for vertical translation      Balance Overall balance assessment: Needs assistance Sitting-balance support: No upper extremity supported, Feet supported Sitting balance-Leahy Scale: Fair     Standing balance support: Bilateral upper extremity supported, Reliant on assistive device for balance Standing balance-Leahy Scale: Poor Standing balance comment: RW in standing                           ADL either performed or assessed with clinical judgement   ADL Overall ADL's : Needs assistance/impaired Eating/Feeding: Set up;Sitting   Grooming: Set up;Sitting   Upper Body Bathing: Minimal assistance;Sitting   Lower Body Bathing: Maximal assistance;Sitting/lateral leans;Sit to/from stand   Upper Body Dressing : Sitting;Minimal assistance   Lower Body Dressing: Maximal assistance;Sit to/from stand;Sitting/lateral leans   Toilet Transfer: Minimal assistance;Ambulation;Rolling walker (2 wheels)   Toileting- Clothing Manipulation and Hygiene: Maximal assistance;Sitting/lateral lean;Sit to/from stand       Functional mobility during ADLs: Moderate assistance;Cueing for safety;Cueing for sequencing;Rolling walker (2 wheels) General ADL Comments: Patient presenting  with decreased activity  tolerance, need for min A to complete functional mobility and transfers. and moderate assist to complete ADLs.     Vision   Vision Assessment?: No apparent visual deficits     Perception     Praxis      Pertinent Vitals/Pain Pain Assessment Pain Assessment: No/denies pain     Hand Dominance Right   Extremity/Trunk Assessment Upper Extremity Assessment Upper Extremity Assessment: Generalized weakness   Lower Extremity Assessment Lower Extremity Assessment: Defer to PT evaluation   Cervical / Trunk Assessment Cervical / Trunk Assessment: Kyphotic   Communication Communication Communication: HOH   Cognition Arousal/Alertness: Awake/alert Behavior During Therapy: Flat affect Overall Cognitive Status: History of cognitive impairments - at baseline                                       General Comments  Intermittent Vtach to 130s with ambulation    Exercises     Shoulder Instructions      Home Living Family/patient expects to be discharged to:: Private residence Living Arrangements: Non-relatives/Friends Available Help at Discharge: Personal care attendant;Available 24 hours/day;Family Type of Home: House Home Access: Ramped entrance     Home Layout: One level     Bathroom Shower/Tub: Chief Strategy Officer: Standard     Home Equipment: Cane - single point;Rolling Walker (2 wheels);BSC/3in1;Wheelchair - manual;Hospital bed   Additional Comments: information obtained from recent evaluation on 5/27.      Prior Functioning/Environment Prior Level of Function : Needs assist             Mobility Comments: RW at all times, min assist for transfers ADLs Comments: Pt requires assist for ADL's at baseline level, has 24 hour care with nursing        OT Problem List: Decreased strength;Decreased activity tolerance;Impaired balance (sitting and/or standing);Decreased range of motion;Decreased  coordination;Decreased safety awareness;Decreased knowledge of use of DME or AE;Decreased knowledge of precautions;Cardiopulmonary status limiting activity;Increased edema      OT Treatment/Interventions: Self-care/ADL training;Therapeutic exercise;Energy conservation;DME and/or AE instruction;Manual therapy;Therapeutic activities;Patient/family education;Balance training    OT Goals(Current goals can be found in the care plan section) Acute Rehab OT Goals Patient Stated Goal: to get some warm blankets OT Goal Formulation: Patient unable to participate in goal setting Time For Goal Achievement: 11/04/22 Potential to Achieve Goals: Good ADL Goals Pt Will Perform Lower Body Bathing: with mod assist;with caregiver independent in assisting;sitting/lateral leans;sit to/from stand Pt Will Perform Lower Body Dressing: with mod assist;with caregiver independent in assisting;sit to/from stand;sitting/lateral leans Pt Will Transfer to Toilet: ambulating;with min guard assist;regular height toilet;grab bars Pt Will Perform Toileting - Clothing Manipulation and hygiene: with min guard assist;sitting/lateral leans;sit to/from stand;with caregiver independent in assisting Additional ADL Goal #1: Patient will be able to complete ADL tasks at sink for 3-5 minutes without need for seated rest break in order to improve overall activity tolerance.  OT Frequency: Min 2X/week    Co-evaluation              AM-PAC OT "6 Clicks" Daily Activity     Outcome Measure Help from another person eating meals?: A Little Help from another person taking care of personal grooming?: A Little Help from another person toileting, which includes using toliet, bedpan, or urinal?: A Lot Help from another person bathing (including washing, rinsing, drying)?: A Lot Help from another person to put on and taking off  regular upper body clothing?: A Lot Help from another person to put on and taking off regular lower body clothing?:  A Little 6 Click Score: 15   End of Session Equipment Utilized During Treatment: Gait belt;Rolling walker (2 wheels) Nurse Communication: Mobility status  Activity Tolerance: Patient tolerated treatment well Patient left: in chair;with call bell/phone within reach;with chair alarm set  OT Visit Diagnosis: Unsteadiness on feet (R26.81);Other abnormalities of gait and mobility (R26.89);Muscle weakness (generalized) (M62.81);Adult, failure to thrive (R62.7)                Time: 1610-9604 OT Time Calculation (min): 19 min Charges:  OT General Charges $OT Visit: 1 Visit OT Evaluation $OT Eval Moderate Complexity: 1 Mod  Pollyann Glen E. Kathalene Sporer, OTR/L Acute Rehabilitation Services [phone]   Cherlyn Cushing 10/21/2022, 10:55 AM

## 2022-10-22 ENCOUNTER — Inpatient Hospital Stay (HOSPITAL_COMMUNITY): Payer: No Typology Code available for payment source

## 2022-10-22 DIAGNOSIS — I70222 Atherosclerosis of native arteries of extremities with rest pain, left leg: Secondary | ICD-10-CM | POA: Diagnosis not present

## 2022-10-22 DIAGNOSIS — M79672 Pain in left foot: Secondary | ICD-10-CM | POA: Diagnosis not present

## 2022-10-22 DIAGNOSIS — I96 Gangrene, not elsewhere classified: Secondary | ICD-10-CM | POA: Diagnosis not present

## 2022-10-22 DIAGNOSIS — K56609 Unspecified intestinal obstruction, unspecified as to partial versus complete obstruction: Secondary | ICD-10-CM

## 2022-10-22 DIAGNOSIS — L03116 Cellulitis of left lower limb: Secondary | ICD-10-CM | POA: Diagnosis not present

## 2022-10-22 DIAGNOSIS — Z515 Encounter for palliative care: Secondary | ICD-10-CM

## 2022-10-22 LAB — COMPREHENSIVE METABOLIC PANEL
ALT: 9 U/L (ref 0–44)
AST: 17 U/L (ref 15–41)
Albumin: 2.4 g/dL — ABNORMAL LOW (ref 3.5–5.0)
Alkaline Phosphatase: 54 U/L (ref 38–126)
Anion gap: 14 (ref 5–15)
BUN: 48 mg/dL — ABNORMAL HIGH (ref 8–23)
CO2: 22 mmol/L (ref 22–32)
Calcium: 8.3 mg/dL — ABNORMAL LOW (ref 8.9–10.3)
Chloride: 101 mmol/L (ref 98–111)
Creatinine, Ser: 2.46 mg/dL — ABNORMAL HIGH (ref 0.61–1.24)
GFR, Estimated: 23 mL/min — ABNORMAL LOW (ref >60)
Glucose, Bld: 133 mg/dL — ABNORMAL HIGH (ref 70–99)
Potassium: 4.9 mmol/L (ref 3.5–5.1)
Sodium: 137 mmol/L (ref 135–145)
Total Bilirubin: 0.7 mg/dL (ref 0.3–1.2)
Total Protein: 5.8 g/dL — ABNORMAL LOW (ref 6.5–8.1)

## 2022-10-22 LAB — APTT
aPTT: 50 seconds — ABNORMAL HIGH (ref 24–36)
aPTT: 80 seconds — ABNORMAL HIGH (ref 24–36)

## 2022-10-22 LAB — CBC
HCT: 31.9 % — ABNORMAL LOW (ref 39.0–52.0)
Hemoglobin: 10.4 g/dL — ABNORMAL LOW (ref 13.0–17.0)
MCH: 24.8 pg — ABNORMAL LOW (ref 26.0–34.0)
MCHC: 32.6 g/dL (ref 30.0–36.0)
MCV: 76 fL — ABNORMAL LOW (ref 80.0–100.0)
Platelets: 215 10*3/uL (ref 150–400)
RBC: 4.2 MIL/uL — ABNORMAL LOW (ref 4.22–5.81)
RDW: 18 % — ABNORMAL HIGH (ref 11.5–15.5)
WBC: 9.5 10*3/uL (ref 4.0–10.5)
nRBC: 0 % (ref 0.0–0.2)

## 2022-10-22 LAB — GLUCOSE, CAPILLARY
Glucose-Capillary: 105 mg/dL — ABNORMAL HIGH (ref 70–99)
Glucose-Capillary: 116 mg/dL — ABNORMAL HIGH (ref 70–99)
Glucose-Capillary: 127 mg/dL — ABNORMAL HIGH (ref 70–99)
Glucose-Capillary: 124 mg/dL — ABNORMAL HIGH (ref 70–99)
Glucose-Capillary: 112 mg/dL — ABNORMAL HIGH (ref 70–99)

## 2022-10-22 LAB — MAGNESIUM: Magnesium: 2.2 mg/dL (ref 1.7–2.4)

## 2022-10-22 MED ORDER — VANCOMYCIN HCL 750 MG/150ML IV SOLN
750.0000 mg | INTRAVENOUS | Status: DC
  Filled 2022-10-22: qty 150

## 2022-10-22 MED ORDER — LACTATED RINGERS IV SOLN: INTRAVENOUS | Status: DC

## 2022-10-22 NOTE — Progress Notes (Signed)
Pharmacy Antibiotic Note  [name] is a 89 y.o. male for which pharmacy has been consulted for cefepime and vancomycin dosing for cellulitis. SCr back up to 2.47.  Plan: Cefepime 2g IV q24h Adjust vancomycin to 750mg  IV q48h. Goal AUC 400-550. Expected AUC: 472 SCr used: 2.47 Monitor clinical progress, c/s, renal function, vancomycin levels as indicated Per discussion with TRH Rhona Leavens), stop dates entered for total 7 days of therapy (last doses 6/5)    Height: 5\' 11"  (180.3 cm) Weight: 63.4 kg (139 lb 12.4 oz) IBW/kg (Calculated) : 75.3  Temp (24hrs), Avg:97.6 F (36.4 C), Min:97 F (36.1 C), Max:98.2 F (36.8 C)  Recent Labs  Lab 10/17/22 1110 10/17/22 1726 10/17/22 1858 10/18/22 0353 10/19/22 0215 10/20/22 0252 10/22/22 0712  WBC 7.0  --   --  7.8 7.5 7.5 9.5  CREATININE 2.07*  --   --  2.05* 2.15* 1.97* 2.46*  LATICACIDVEN  --  2.2* 1.8  --   --   --   --      Estimated Creatinine Clearance: 15.7 mL/min (A) (by C-G formula based on SCr of 2.46 mg/dL (H)).    No Known Allergies  Leia Alf, PharmD, BCPS Please check AMION for all [facility] contact numbers Clinical Pharmacist 10/22/2022 10:28 AM

## 2022-10-22 NOTE — Progress Notes (Signed)
MRI will be cancelled. Pt has an abandoned lead and we are unable to proceed with scan.

## 2022-10-22 NOTE — Progress Notes (Signed)
Pt pulled the NG Tube that was inserted at the beginning of the shift. MD, Dr, Cliffton Asters notified. No distress noted with the assessment.

## 2022-10-22 NOTE — Progress Notes (Signed)
   10/22/22 1057  Spiritual Encounters  Type of Visit Initial  Care provided to: Patient  Conversation partners present during encounter Other (comment) (PMT NP-Kasie, PMT RN-Dawn)  Referral source APP  Reason for visit End-of-life (Home with Hospice)  Spiritual Framework  Community/Connection Faith community Journalist, newspaper understands from chart notes, Pt. is loving and faithful youth leader.)  Patient Stress Factors None identified  Interventions  Spiritual Care Interventions Made Prayer;Compassionate presence  Intervention Outcomes  Outcomes Connection to spiritual care  Spiritual Care Plan  Spiritual Care Issues Still Outstanding Chaplain will continue to follow   This chaplain is available for F/U spiritual care as needed. Family is not at the bedside.  Chaplain Stephanie Acre [phone]

## 2022-10-22 NOTE — Progress Notes (Signed)
Daily Progress Note   Patient Name: [name]       Date: 10/22/2022 DOB: 1923/09/23  Age: 89 y.o. MRN#: [id number] Attending Physician: Jerald Kief, MD Primary Care Physician: Mliss Sax, MD Admit Date: 10/17/2022  Reason for Consultation/Follow-up: Establishing goals of care  Patient Profile/HPI:  89 y.o. male  with past medical history of peripheral artery disease, CHF, dementia, afib, tachybrady syndrome s/p pacmaker, CKD IV, recently admitted 5/20-5/28 with volume overload resulting in pericardial infusion (cardiocentesis with 1033 cc off), L toe gangrene, discharged home on 5/28 and readmitted on 10/17/2022 with L lower extremity pain. Workup reveals cellulitis and critical limb ischemia. Recommendations per vascular, podiatry and cardiology have been comfort focused. He is being treated with antibiotics and MRI is pending. Palliative consulted for further goals of care.    Subjective: Chart reviewed including labs, progress notes, imaging from this and previous encounters.  Noted KUB with possible obstruction. CT abdomen pending. He pulled out NG tube.  Evaluated patient today. Sleeping, appears much more comfortable than when I saw him yesterday.  No family at bedside.     Physical Exam Vitals and nursing note reviewed.  Abdominal:     General: There is distension.  Skin:    General: Skin is warm and dry.  Neurological:     Comments: Somnolent, did not answer questions             Vital Signs: BP 95/70 (BP Location: Left Arm)   Pulse 89   Temp (!) 97.4 F (36.3 C) (Oral)   Resp 16   Ht 5\' 11"  (1.803 m)   Wt 63.4 kg   SpO2 96%   BMI 19.49 kg/m  SpO2: SpO2: 96 % O2 Device: O2 Device: Room Air O2 Flow Rate:    Intake/output summary:   Intake/Output Summary (Last 24 hours) at 10/22/2022 1144 Last data filed at 10/22/2022 0500 Gross per 24 hour  Intake 413.88 ml  Output 300 ml  Net 113.88 ml   LBM: Last BM Date : 10/21/22 Baseline Weight: Weight: 62 kg Most recent weight: Weight: 63.4 kg       Palliative Assessment/Data: PPS: 20%      Patient Active Problem List   Diagnosis Date Noted   Chronic systolic CHF (congestive heart failure) ([name]) 10/18/2022   Cellulitis  of left lower extremity 10/17/2022   Pericardial effusion 10/08/2022   CHF (congestive heart failure) ([name]) 10/07/2022   Gangrene of toe of left foot ([name]) 10/07/2022   LVH (left ventricular hypertrophy) 09/11/2022   Coronary atherosclerosis 08/15/2022   Encounter for fitting and adjustment of hearing aid 08/15/2022   Hearing loss 08/15/2022   Hypertensive heart disease with heart failure ([name]) 08/15/2022   Muscle weakness (generalized) 08/15/2022   Need for assistance with personal care 08/15/2022   Encounter for issue of other medical certificate 82/95/6213   Encounter for other administrative examinations 08/15/2022   Other specified hearing loss, bilateral 08/15/2022   Pain in joint, lower leg 08/15/2022   Problem related to unspecified psychosocial circumstances 08/15/2022   Cellulitis and abscess of toe of left foot 08/08/2022   Left leg cellulitis 08/08/2022   Bilateral pleural effusion 07/22/2022   DNR (do not resuscitate)/DNI(Do Not Intubate) 07/22/2022   Edema due to hypoalbuminemia 07/22/2022   Chronic hypoxic respiratory failure, on home oxygen therapy ([name]) - 4 L/min 07/22/2022   Pressure injury of skin 07/10/2022   Essential hypertension 07/06/2022   Alzheimer disease ([name]) 07/06/2022   Pleural effusion 07/06/2022   History of CVA (cerebrovascular accident) 07/06/2022   Ventral hernia 07/06/2022   Ulcer of lower extremity, limited to breakdown of skin ([name]) 05/30/2022   Urinary frequency 02/14/2022   Hypotension due to drugs  02/07/2022   Need for influenza vaccination 02/07/2022   History of anemia due to chronic kidney disease 12/31/2021   Agitation due to dementia ([name]) 12/25/2021   BPH (benign prostatic hyperplasia) 12/05/2021   Lower extremity edema 12/05/2021   Scrotum swelling 12/05/2021   Elevated homocysteine 11/30/2021   Low magnesium level 11/30/2021   Balanitis 11/30/2021   Gastroesophageal reflux disease 10/22/2021   Urinary tract infection 10/22/2021   Chronic atrial fibrillation with RVR ([name]) 07/30/2021   Hypophosphatemia 07/29/2021   Stage 3b chronic kidney disease ([name]) 04/25/2021   Continuous leakage of urine 04/25/2021   At high risk for injury related to fall 04/25/2021   Hypocalcemia 10/30/2020   Vitamin D deficiency 10/30/2020   Edema due to malnutrition ([name]) 10/30/2020   Unsteady gait when walking 09/21/2020   Sarcopenia 09/21/2020   B12 deficiency 01/27/2020   Acute on chronic diastolic CHF (congestive heart failure) ([name]) 03/09/2019   Pain in both feet 11/17/2018   Neuropathy 10/09/2018   Insomnia 10/09/2018   Slow transit constipation 06/18/2018   Iron deficiency anemia 11/25/2017   Decreased appetite 10/16/2017   Hearing difficulty of both ears 10/16/2017   Allergic rhinitis due to pollen 09/03/2017   Asymmetric SNHL (sensorineural hearing loss) 08/14/2017   Tinnitus aurium, left 08/14/2017   Ventricular tachycardia ([name]) 01/14/2017   Malnutrition of moderate degree 01/13/2017   Exertional dyspnea 01/12/2017   Hypertension    PVD (peripheral vascular disease) ([name]) 08/14/2016   CVA (cerebral infarction) 07/23/2015   Microcytic anemia 07/23/2015   Stroke (cerebrum) ([name]) 07/23/2015   CKD (chronic kidney disease) stage 4, GFR 15-29 ml/min ([name]) 07/23/2015   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 03/15/2015   Primary open-angle glaucoma, left eye, indeterminate stage 03/15/2015   PCO (posterior capsule opacification) 11/29/2014   Ptosis of eyelid 07/27/2012   POAG (primary  open-angle glaucoma) 11/27/2011   Trichiasis 11/27/2011   PPM-[employer] 03/13/2010   DM2 (diabetes mellitus, type 2) ([name]) 11/14/2008   Paroxysmal atrial fibrillation ([name]) 11/14/2008   BRADYCARDIA-TACHYCARDIA SYNDROME s/p PPM 11/14/2008    Palliative Care Assessment & Plan  Assessment/Recommendations/Plan  Continue current plan of care TOC order has been placed for hospice referral at discharge   Code Status: DNR  Prognosis:  < 6 months  Discharge Planning: Home with Hospice  Care plan was discussed with patient's granddaughter  Thank you for allowing the Palliative Medicine Team to assist in the care of this patient.  Greater than 50%  of this time was spent counseling and coordinating care related to the above assessment and plan.  Ocie Bob, AGNP-C Palliative Medicine   Please contact Palliative Medicine Team phone at [phone] for questions and concerns.

## 2022-10-22 NOTE — Consult Note (Signed)
Reason for Consult/Chief Complaint: SBO Consultant: Rhona Leavens, MD  [name] is an 89 y.o. male.   HPI: 8M with pAF, SSS s/p pacemaker, heart failure, cardiac tamponade s/p pericardiocentesis 10/09/22, on Eliquis (CHA2DS2-VASc score of 8), CKD4, HTN, T2DM, admitted for L foot cellulitis. On 6/3, patient was found to have pSBO on AXR associated with abdominal pain and nausea. NGT was placed which the patient pulled out. SBO protocol doe snot appear to have been ordered/administered. Non-contrasted CT A/P ordered today with report reflecting SBO and possible transition point within inferior ventral hernia. Patient reports BUQ abdominal pain. Last BM documented 6/2 PM.    Past Medical History:  Diagnosis Date   Acute CVA (cerebrovascular accident) [facility])    Arthritis    Atrial fibrillation ([name])    CKD (chronic kidney disease)    History of hiatal hernia    Hx SBO    Last 2013 all treated conservatively   Hypertension    Presence of permanent cardiac pacemaker    Prostate cancer ([name])    Stroke ([name]) 2017   left facial drooping noted 08/14/2016   Tachycardia-bradycardia syndrome ([name])    Type II diabetes mellitus ([name])    Ventral hernia    x3    Past Surgical History:  Procedure Laterality Date   ABDOMINAL AORTOGRAM W/LOWER EXTREMITY N/A 08/07/2016   Procedure: Abdominal Aortogram w/Lower Extremity;  Surgeon: Iran Ouch, MD;  Location: [facility] INVASIVE CV LAB;  Service: Cardiovascular;  Laterality: N/A;   CHOLECYSTECTOMY OPEN  03/31/2001   Dr Wiliam Ke   COLON SURGERY     EXPLORATORY LAPAROTOMY  08/2004   Hattie Perch 10/01/2010   HEMORRHOID SURGERY     HERNIA REPAIR     HIATAL HERNIA REPAIR  2002   INCISIONAL HERNIA REPAIR  08/2004   Hattie Perch 10/01/2010   INSERT / REPLACE / REMOVE PACEMAKER     IR THORACENTESIS ASP PLEURAL SPACE W/IMG GUIDE  07/23/2022   IR THORACENTESIS ASP PLEURAL SPACE W/IMG GUIDE  07/24/2022   IR THORACENTESIS ASP PLEURAL SPACE W/IMG GUIDE  07/25/2022    PERICARDIOCENTESIS N/A 10/09/2022   Procedure: PERICARDIOCENTESIS;  Surgeon: Corky Crafts, MD;  Location: [facility] INVASIVE CV LAB;  Service: Cardiovascular;  Laterality: N/A;   PERIPHERAL VASCULAR BALLOON ANGIOPLASTY  08/07/2016   Procedure: Peripheral Vascular Balloon Angioplasty;  Surgeon: Iran Ouch, MD;  Location: [facility] INVASIVE CV LAB;  Service: Cardiovascular;;  left popliteal artery   PERIPHERAL VASCULAR INTERVENTION  08/14/2016   popliteal artery/notes 08/14/2016   PERIPHERAL VASCULAR INTERVENTION Left 08/14/2016   Procedure: Peripheral Vascular Intervention;  Surgeon: Iran Ouch, MD;  Location: [facility] INVASIVE CV LAB;  Service: Cardiovascular;  Laterality: Left;  popliteal artery   PERMANENT PACEMAKER GENERATOR CHANGE N/A 04/06/2012   Procedure: PERMANENT PACEMAKER GENERATOR CHANGE;  Surgeon: Marinus Maw, MD;  Location: [facility] CATH LAB;  Service: Cardiovascular;  Laterality: N/A;   PPM GENERATOR CHANGEOUT N/A 02/04/2022   Procedure: PPM GENERATOR CHANGEOUT;  Surgeon: Marinus Maw, MD;  Location: [facility] INVASIVE CV LAB;  Service: Cardiovascular;  Laterality: N/A;   SMALL INTESTINE SURGERY  09/16/2004   SBO resection, VH repair    Family History  Problem Relation Age of Onset   Pneumonia Father    Stroke Brother    Stroke Sister    Cancer Brother        type unknown    Social History:  reports that he quit smoking about 67 years ago. His smoking use included cigarettes. He  has a 22.50 pack-year smoking history. He has been exposed to tobacco smoke. He quit smokeless tobacco use about 70 years ago.  His smokeless tobacco use included chew. He reports that he does not drink alcohol and does not use drugs.  Allergies: No Known Allergies  Medications: I have reviewed the patient's current medications.  Results for orders placed or performed during the hospital encounter of 10/17/22 (from the past 48 hour(s))  Glucose, capillary     Status: Abnormal   Collection Time: 10/20/22   9:00 PM  Result Value Ref Range   Glucose-Capillary 195 (H) 70 - 99 mg/dL    Comment: Glucose reference range applies only to samples taken after fasting for at least 8 hours.  Glucose, capillary     Status: Abnormal   Collection Time: 10/21/22  7:44 AM  Result Value Ref Range   Glucose-Capillary 155 (H) 70 - 99 mg/dL    Comment: Glucose reference range applies only to samples taken after fasting for at least 8 hours.  Glucose, capillary     Status: Abnormal   Collection Time: 10/21/22 12:10 PM  Result Value Ref Range   Glucose-Capillary 130 (H) 70 - 99 mg/dL    Comment: Glucose reference range applies only to samples taken after fasting for at least 8 hours.  Glucose, capillary     Status: Abnormal   Collection Time: 10/21/22  4:04 PM  Result Value Ref Range   Glucose-Capillary 126 (H) 70 - 99 mg/dL    Comment: Glucose reference range applies only to samples taken after fasting for at least 8 hours.  Glucose, capillary     Status: Abnormal   Collection Time: 10/21/22  6:08 PM  Result Value Ref Range   Glucose-Capillary 139 (H) 70 - 99 mg/dL    Comment: Glucose reference range applies only to samples taken after fasting for at least 8 hours.  Glucose, capillary     Status: Abnormal   Collection Time: 10/21/22  9:20 PM  Result Value Ref Range   Glucose-Capillary 145 (H) 70 - 99 mg/dL    Comment: Glucose reference range applies only to samples taken after fasting for at least 8 hours.  Comprehensive metabolic panel     Status: Abnormal   Collection Time: 10/22/22  7:12 AM  Result Value Ref Range   Sodium 137 135 - 145 mmol/L   Potassium 4.9 3.5 - 5.1 mmol/L   Chloride 101 98 - 111 mmol/L   CO2 22 22 - 32 mmol/L   Glucose, Bld 133 (H) 70 - 99 mg/dL    Comment: Glucose reference range applies only to samples taken after fasting for at least 8 hours.   BUN 48 (H) 8 - 23 mg/dL   Creatinine, Ser 4.09 (H) 0.61 - 1.24 mg/dL   Calcium 8.3 (L) 8.9 - 10.3 mg/dL   Total Protein 5.8 (L)  6.5 - 8.1 g/dL   Albumin 2.4 (L) 3.5 - 5.0 g/dL   AST 17 15 - 41 U/L   ALT 9 0 - 44 U/L   Alkaline Phosphatase 54 38 - 126 U/L   Total Bilirubin 0.7 0.3 - 1.2 mg/dL   GFR, Estimated 23 (L) >60 mL/min    Comment: (NOTE) Calculated using the CKD-EPI Creatinine Equation (2021)    Anion gap 14 5 - 15    Comment: Performed at [facility], [address], [place], [place] [address]  CBC     Status: Abnormal   Collection Time: 10/22/22  7:12 AM  Result Value Ref Range   WBC 9.5 4.0 - 10.5 K/uL   RBC 4.20 (L) 4.22 - 5.81 MIL/uL   Hemoglobin 10.4 (L) 13.0 - 17.0 g/dL   HCT 16.1 (L) 09.6 - 04.5 %   MCV 76.0 (L) 80.0 - 100.0 fL   MCH 24.8 (L) 26.0 - 34.0 pg   MCHC 32.6 30.0 - 36.0 g/dL   RDW 40.9 (H) 81.1 - 91.4 %   Platelets 215 150 - 400 K/uL    Comment: REPEATED TO VERIFY   nRBC 0.0 0.0 - 0.2 %    Comment: Performed at [facility] Lab, [address] [place], [place] [address]  Magnesium     Status: None   Collection Time: 10/22/22  7:12 AM  Result Value Ref Range   Magnesium 2.2 1.7 - 2.4 mg/dL    Comment: Performed at [facility], [address] [place], [place] [address]  APTT     Status: Abnormal   Collection Time: 10/22/22  7:12 AM  Result Value Ref Range   aPTT 50 (H) 24 - 36 seconds    Comment:        IF BASELINE aPTT IS ELEVATED, SUGGEST PATIENT RISK ASSESSMENT BE USED TO DETERMINE APPROPRIATE ANTICOAGULANT THERAPY. Performed at [facility] Lab, [address], [place], [place] [address]   Glucose, capillary     Status: Abnormal   Collection Time: 10/22/22  7:43 AM  Result Value Ref Range   Glucose-Capillary 105 (H) 70 - 99 mg/dL    Comment: Glucose reference range applies only to samples taken after fasting for at least 8 hours.  Glucose, capillary     Status: Abnormal   Collection Time: 10/22/22 11:34 AM  Result Value Ref Range   Glucose-Capillary 127 (H) 70 - 99 mg/dL    Comment: Glucose reference range applies only to samples taken after  fasting for at least 8 hours.  Glucose, capillary     Status: Abnormal   Collection Time: 10/22/22 12:29 PM  Result Value Ref Range   Glucose-Capillary 116 (H) 70 - 99 mg/dL    Comment: Glucose reference range applies only to samples taken after fasting for at least 8 hours.  Glucose, capillary     Status: Abnormal   Collection Time: 10/22/22  3:49 PM  Result Value Ref Range   Glucose-Capillary 124 (H) 70 - 99 mg/dL    Comment: Glucose reference range applies only to samples taken after fasting for at least 8 hours.    CT ABDOMEN PELVIS WO CONTRAST  Result Date: 10/22/2022 CLINICAL DATA:  Bowel obstruction suspected EXAM: CT ABDOMEN AND PELVIS WITHOUT CONTRAST TECHNIQUE: Multidetector CT imaging of the abdomen and pelvis was performed following the standard protocol without IV contrast. RADIATION DOSE REDUCTION: This exam was performed according to the departmental dose-optimization program which includes automated exposure control, adjustment of the mA and/or kV according to patient size and/or use of iterative reconstruction technique. COMPARISON:  Abdominal x-ray 10/21/2022. CT chest abdomen pelvis 07/06/2022 FINDINGS: Lower chest: Moderate right and large left pleural effusions are again seen with compressive atelectasis in the bilateral lower lobes, similar to prior. The heart is enlarged. There is a small pericardial effusion, also unchanged. Hepatobiliary: No focal liver abnormality is seen. Status post cholecystectomy. No biliary dilatation. Pancreas: There is atrophy of the pancreatic body and tail with ductal dilatation, unchanged from prior. Diffuse pancreatic calcifications are again seen compatible with chronic pancreatitis. No acute inflammation. Spleen: Normal  in size without focal abnormality. Adrenals/Urinary Tract: No urinary tract calculus or hydronephrosis. Bladder and adrenal glands are within normal limits. There are bilateral renal cortical hypodensities which are too small to  characterize, favored as cysts. Stomach/Bowel: Small bowel staple line again noted in the right pelvis. There are anterior 2 ventral hernias containing small bowel. The more inferior is questionable transition point for small bowel obstruction. There are dilated small bowel loops with air-fluid levels in the central and left abdomen measuring up to 5.7 cm. Bowel loops are more dilated proximal to the inferior hernia. Colon is nondilated. The appendix is within normal limits. Moderate-sized hiatal hernia is present. The stomach is otherwise within normal limits. There is no pneumatosis or free air. Mild mesenteric edema is present. Vascular/Lymphatic: Aortic atherosclerosis. No enlarged abdominal or pelvic lymph nodes. Fluid attenuation area in the retrocrural region the right of the distal esophagus appears unchanged measuring 2.2 x 2.4 cm, possibly a lymphocele. Reproductive: Prostate is small in size, unchanged. Other: Diffuse body wall edema is increased.  There is no ascites. Musculoskeletal: No acute osseous findings. IMPRESSION: 1. Findings compatible with small bowel obstruction with questionable transition point in the inferior ventral hernia. Findings may be related to incarcerated or strangulated hernia. 2. Stable moderate right and large left pleural effusions with compressive atelectasis. 3. Stable cardiomegaly with small pericardial effusion. 4. Increased diffuse body wall edema. 5. Subcentimeter Bosniak II renal cyst, too small to characterize. No follow-up imaging is recommended. JACR 2018 Feb; 264-273, Management of the Incidental Renal Mass on CT, [employer] 2021; 814-848, Bosniak Classification of Cystic Renal Masses, Version 2019. 6. Other chronic findings as above. Aortic Atherosclerosis (ICD10-I70.0). Electronically Signed   By: Darliss Cheney M.D.   On: 10/22/2022 16:37   DG Abd 1 View  Result Date: 10/21/2022 CLINICAL DATA:  Possible constipation EXAM: ABDOMEN - 1 VIEW COMPARISON:   07/08/2022 FINDINGS: Minimal retained fecal material is noted. Multiple dilated loops of small bowel are noted. No free air is seen. No mass lesion is noted. IMPRESSION: Changes consistent with at least a partial small bowel obstruction. CT may be helpful for further evaluation. These results were called by telephone at the time of interpretation on 10/21/2022 at 7:04 pm to provider [facility] , who verbally acknowledged these results. Electronically Signed   By: Alcide Clever M.D.   On: 10/21/2022 19:04    ROS 10 point review of systems is negative except as listed above in HPI.   Physical Exam Blood pressure (!) 86/65, pulse (!) 101, temperature 97.7 F (36.5 C), resp. rate (!) 23, height 5\' 11"  (1.803 m), weight 63.4 kg, SpO2 92 %. Constitutional: well-developed, thin HEENT: pupils equal, round, reactive to light, 2mm b/l, moist conjunctiva, external inspection of ears and nose normal, hearing intact Oropharynx: normal oropharyngeal mucosa, poor dentition Neck: no thyromegaly, trachea midline, no midline cervical tenderness to palpation Chest: breath sounds equal bilaterally, normal respiratory effort, no midline or lateral chest wall tenderness to palpation/deformity Abdomen: soft, numerous abdominal scars including what appears to be an open chole and large midline laparotomy, numerous soft and nontender hernias, no bruising, no hepatosplenomegaly Psych:  poor memory      Assessment/Plan: 25M with complex medical and surgical history and CT evidence of SBO. Clinically, he does not appear incarcerated or strangulated and I would recommend continued clinical monitoring and optimization of his electrolytes (goals: K-4, phos-3, mag-2). If his nausea recurs, would recommend NGT replacement and SBO protocol. He is extremely high operative  risk given his medical co-morbidities as well as his prior surgical history, so every attempt should be made to accomplish resolution of his symptoms  non-operatively.    Diamantina Monks, MD General and Trauma Surgery [facility]

## 2022-10-22 NOTE — Progress Notes (Signed)
ANTICOAGULATION CONSULT NOTE  Pharmacy Consult for Heparin Indication: atrial fibrillation  No Known Allergies  Patient Measurements: Height: 5\' 11"  (180.3 cm) Weight: 63.4 kg (139 lb 12.4 oz) IBW/kg (Calculated) : 75.3 Heparin Dosing Weight: total body weight  Vital Signs: Temp: 97.6 F (36.4 C) (06/04 0743) Temp Source: Oral (06/04 0743) BP: 94/69 (06/04 0743) Pulse Rate: 87 (06/04 0743)  Labs: Recent Labs    10/20/22 0252 10/22/22 0712  HGB 9.9* 10.4*  HCT 28.9* 31.9*  PLT 186 215  APTT  --  50*  CREATININE 1.97*  --      Estimated Creatinine Clearance: 19.7 mL/min (A) (by C-G formula based on SCr of 1.97 mg/dL (H)).   Medical History: Past Medical History:  Diagnosis Date   Acute CVA (cerebrovascular accident) [facility])    Arthritis    Atrial fibrillation ([name])    CKD (chronic kidney disease)    History of hiatal hernia    Hx SBO    Last 2013 all treated conservatively   Hypertension    Presence of permanent cardiac pacemaker    Prostate cancer ([name])    Stroke ([name]) 2017   left facial drooping noted 08/14/2016   Tachycardia-bradycardia syndrome ([name])    Type II diabetes mellitus ([name])    Ventral hernia    x3    Assessment: 89 YOM admitted for cellulitis now with concern for SBO on abdominal Xray. Plan for NG decompression and abd/pelvic CT for further eval. Eliquis transitioned to heparin while NPO. Last Eliquis dose 6/3 @0841  - will use aPTT to monitor until levels are correlating.  Initial aPTT low at 50. Heparin level expected to be elevated in the setting of recent Eliquis. CBC stable. No bleeding or issues with infusion per discussion with RN.  Goal of Therapy:  Heparin level 0.3-0.7 units/ml aPTT 66-102 seconds Monitor platelets by anticoagulation protocol: Yes   Plan:  No bolus with recent Eliquis and advanced age Increase heparin infusion to 1000 units/hr Check 8hr aPTT Monitor daily heparin level/aPTT until correlating, CBC, s/sx  bleeding Follow for ability to switch back to Eliquis   Leia Alf, PharmD, BCPS Please check AMION for all [facility] Pharmacy contact numbers Clinical Pharmacist 10/22/2022 10:11 AM

## 2022-10-22 NOTE — Progress Notes (Signed)
Progress Note   Patient: [name] WUJ:[id number] DOB: 10/08/1923 DOA: 10/17/2022     5 DOS: the patient was seen and examined on 10/22/2022   Brief hospital course: 89 y.o. male with medical history significant for paroxysmal atrial fibrillation complicated by sick sinus syndrome status post pacemaker placement, chronically anticoagulated on Eliquis, CKD stage IV associated baseline creatinine 2.0-2.3, essential hypertension, type 2 diabetes mellitus, chronic systolic heart failure, anemia of chronic kidney disease associated baseline hemoglobin 9-11, who is admitted to [facility] on 10/17/2022 with cellulitis of the left foot after presenting from home to [facility] ED complaining of left foot pain.   The patient has a known necrotic left great toe, for which he reportedly follows with podiatry as an outpatient. He was recently hospitalized from 10/07/2022 to 10/16/2022 for cardiac tamponade, during which hospitalization he underwent pericardiocentesis. He was ultimately discharged home on 5/29  Assessment and Plan: #) Cellulitis of left foot with peripheral artery disease -Pt had been followed by Dr. Kirke Corin PTA for PAD, recently with concern for occlusion at popliteal and TP trunk stents -Noted to have increasing erythema and swelling in the setting of known necrotic toe -Plain films reviewed, findings of fracture of the base of the prox phalanx of the great toe medially. No evidence of bone infection noted -Given concerns of possible deeper infection, had consulted Podiatry to follow -Have ordered MRI of foot, remains pending -Family had been contemplating proceeding angiography with possible intervention PTA -Continued on IV vancomycin and cefepime per inpatient pharmacy -Appreciate input by Podiatry. Not candidate for surgery with Podiatry recommending Cardiology reconsult -Appreciate input by Cardiology. Cardiology has recommended comfort care and medical management. Cardiology to  relay this to Dr. Kirke Corin for review as well as peripheral vascular team  -Palliative Care is following     #) Paroxysmal atrial fibrillation:  -CHA2DS2-VASc score of  8, continued on heparin gtt -continue lopressor -Most recent echocardiogram occurred on 10/10/2022 was notable for LVEF 45 to 50%.     #) Chronic systolic heart failure:  - most recent echocardiogram performed on 10/10/2022, with results include LVEF 45 to 50% -No clinical evidence to suggest acutely decompensated heart failure at this time.  -Holding diuretic while NPO, and giving gentle IVF     #) CKD Stage  4:  -Attempt to avoid nephrotoxic agents.   -Cr higher today at 2.46 while NPO -Will start basal LR at 50cc/hr, aware that pt had been volume overloaded on recent admit requiring pericardiocentesis -recheck bmet in AM    #) Anemia of chronic kidney disease:  -Hemodynamically stable -Hgb remains stable -Recheck cbc in AM    #) Type 2 Diabetes Mellitus:  -continue SSI as needed.    #) Benign Prostatic Hyperplasia:  - continue home tamsulosin.and finasteride.  #) Hypotension  -Had been on midodrine PTA, continued  #) Recurrent SBO -on 6/3, noted to have increased abd pain and nausea -abd xray demonstrated partial SBO. Pt was given NG to low wall suction overnight, however pt pulled out NG in the early AM -Today, pt appeared comfortable. Pos BS on exam. Abd seemed less distended this AM -Ordered f/u CT abd. Findings notable for SBO with findings suggestive of incarcerated or strangulated hernia -Have consulted General Surgery      Subjective: Reports feeling better this AM. Denies abd pain or nausea  Physical Exam: Vitals:   10/22/22 1551 10/22/22 1552 10/22/22 1553 10/22/22 1554  BP:  (!) 86/65    Pulse: 100 Marland Kitchen)  101    Resp: 20 20 19  (!) 23  Temp: 97.7 F (36.5 C) 97.7 F (36.5 C)    TempSrc:      SpO2: 94% 92%    Weight:      Height:       General exam: Awake, laying in bed, in nad Respiratory  system: Normal respiratory effort, no wheezing Cardiovascular system: regular rate, s1, s2 Gastrointestinal system:less distended this AM, positive BS Central nervous system: CN2-12 grossly intact, strength intact Extremities: Perfused, no clubbing Skin: Normal skin turgor, no notable skin lesions seen Psychiatry: Mood normal // no visual hallucinations   Data Reviewed:  Labs reviewed: Na 137, K 4.9, Cr 2.46  Family Communication: Pt in room, family not at bedside  Disposition: Status is: Inpatient Remains inpatient appropriate because: Severity of illness  Planned Discharge Destination: Home    Author: Rickey Barbara, MD 10/22/2022 5:04 PM  For on call review [email or web address].

## 2022-10-22 NOTE — Progress Notes (Signed)
ANTICOAGULATION CONSULT NOTE  Pharmacy Consult for Heparin Indication: atrial fibrillation  No Known Allergies  Patient Measurements: Height: 5\' 11"  (180.3 cm) Weight: 63.4 kg (139 lb 12.4 oz) IBW/kg (Calculated) : 75.3 Heparin Dosing Weight: total body weight  Vital Signs: Temp: 97.4 F (36.3 C) (06/04 2006) Temp Source: Oral (06/04 2006) BP: 100/60 (06/04 2006) Pulse Rate: 109 (06/04 2006)  Labs: Recent Labs    10/20/22 0252 10/22/22 0712 10/22/22 1942  HGB 9.9* 10.4*  --   HCT 28.9* 31.9*  --   PLT 186 215  --   APTT  --  50* 80*  CREATININE 1.97* 2.46*  --      Estimated Creatinine Clearance: 15.7 mL/min (A) (by C-G formula based on SCr of 2.46 mg/dL (H)).   Medical History: Past Medical History:  Diagnosis Date   Acute CVA (ce[facility]bro[facility] accident) [facility])    Arthritis    Atrial fibrillation ([name])    CKD (chronic kidney disease)    History of hiatal hernia    Hx SBO    Last 2013 all treated conservatively   Hypertension    Presence of permanent cardiac pacemaker    Prostate cancer ([name])    Stroke ([name]) 2017   left facial drooping noted 08/14/2016   Tachycardia-bradycardia syndrome ([name])    Type II diabetes mellitus ([name])    Ventral hernia    x3    Assessment: 89 YOM admitted for cellulitis now with concern for SBO on abdominal Xray. Plan for NG decompression and abd/pelvic CT for further eval. Eliquis transitioned to heparin while NPO. Last Eliquis dose 6/3 @0841  - will use aPTT to monitor until levels are correlating.  Repeat aPTT is therapeutic at 80 seconds.  Goal of Therapy:  Heparin level 0.3-0.7 units/ml aPTT 66-102 seconds Monitor platelets by anticoagulation protocol: Yes   Plan:  Continue heparin 1000 units/h Daily aPTT, heparin level, CBC  Fredonia Highland, PharmD, [place], [facility] Clinical Pharmacist [phone] Please check AMION for all [facility] Pharmacy numbers 10/22/2022

## 2022-10-23 ENCOUNTER — Inpatient Hospital Stay (HOSPITAL_COMMUNITY): Payer: No Typology Code available for payment source

## 2022-10-23 DIAGNOSIS — K56609 Unspecified intestinal obstruction, unspecified as to partial versus complete obstruction: Secondary | ICD-10-CM | POA: Diagnosis not present

## 2022-10-23 DIAGNOSIS — L03116 Cellulitis of left lower limb: Secondary | ICD-10-CM | POA: Diagnosis not present

## 2022-10-23 DIAGNOSIS — I5022 Chronic systolic (congestive) heart failure: Secondary | ICD-10-CM | POA: Diagnosis not present

## 2022-10-23 DIAGNOSIS — Z7189 Other specified counseling: Secondary | ICD-10-CM | POA: Diagnosis not present

## 2022-10-23 LAB — CBC
HCT: 31.6 % — ABNORMAL LOW (ref 39.0–52.0)
Hemoglobin: 10.2 g/dL — ABNORMAL LOW (ref 13.0–17.0)
MCH: 25 pg — ABNORMAL LOW (ref 26.0–34.0)
MCHC: 32.3 g/dL (ref 30.0–36.0)
MCV: 77.5 fL — ABNORMAL LOW (ref 80.0–100.0)
Platelets: 213 10*3/uL (ref 150–400)
RBC: 4.08 MIL/uL — ABNORMAL LOW (ref 4.22–5.81)
RDW: 17.8 % — ABNORMAL HIGH (ref 11.5–15.5)
WBC: 9.9 10*3/uL (ref 4.0–10.5)
nRBC: 0 % (ref 0.0–0.2)

## 2022-10-23 LAB — APTT: aPTT: 82 seconds — ABNORMAL HIGH (ref 24–36)

## 2022-10-23 LAB — GLUCOSE, CAPILLARY
Glucose-Capillary: 91 mg/dL (ref 70–99)
Glucose-Capillary: 68 mg/dL — ABNORMAL LOW (ref 70–99)
Glucose-Capillary: 101 mg/dL — ABNORMAL HIGH (ref 70–99)
Glucose-Capillary: 117 mg/dL — ABNORMAL HIGH (ref 70–99)
Glucose-Capillary: 113 mg/dL — ABNORMAL HIGH (ref 70–99)
Glucose-Capillary: 124 mg/dL — ABNORMAL HIGH (ref 70–99)

## 2022-10-23 LAB — PHOSPHORUS: Phosphorus: 3.9 mg/dL (ref 2.5–4.6)

## 2022-10-23 LAB — HEPARIN LEVEL (UNFRACTIONATED): Heparin Unfractionated: >1.1 IU/mL — ABNORMAL HIGH (ref 0.30–0.70)

## 2022-10-23 MED ORDER — CETAPHIL MOISTURIZING EX LOTN: TOPICAL_LOTION | CUTANEOUS | Status: DC | PRN

## 2022-10-23 MED ORDER — DEXTROSE IN LACTATED RINGERS 5 % IV SOLN: INTRAVENOUS | Status: DC

## 2022-10-23 MED ORDER — INSULIN ASPART 100 UNIT/ML IJ SOLN
0.0000 [IU] | INTRAMUSCULAR | Status: DC
  Administered 2022-10-23: 1 [IU] via SUBCUTANEOUS
  Administered 2022-10-24 (×2): 2 [IU] via SUBCUTANEOUS
  Administered 2022-10-24 – 2022-10-25 (×2): 1 [IU] via SUBCUTANEOUS

## 2022-10-23 MED ORDER — SODIUM CHLORIDE 0.9 % IV BOLUS
500.0000 mL | Freq: Once | INTRAVENOUS | Status: AC
  Administered 2022-10-23: 500 mL via INTRAVENOUS

## 2022-10-23 MED ORDER — DIATRIZOATE MEGLUMINE & SODIUM 66-10 % PO SOLN
90.0000 mL | Freq: Once | ORAL | Status: AC
  Administered 2022-10-23: 90 mL via ORAL
  Filled 2022-10-23 (×2): qty 90

## 2022-10-23 NOTE — Progress Notes (Signed)
ANTICOAGULATION CONSULT NOTE  Pharmacy Consult for Heparin Indication: atrial fibrillation  No Known Allergies  Patient Measurements: Height: 5\' 11"  (180.3 cm) Weight: 63.4 kg (139 lb 12.4 oz) IBW/kg (Calculated) : 75.3 Heparin Dosing Weight: total body weight  Vital Signs: Temp: 97.4 F (36.3 C) (06/05 0425) Temp Source: Axillary (06/05 0425) BP: 109/79 (06/05 1222) Pulse Rate: 86 (06/05 0800)  Labs: Recent Labs    10/22/22 0712 10/22/22 1942 10/23/22 0348  HGB 10.4*  --  10.2*  HCT 31.9*  --  31.6*  PLT 215  --  213  APTT 50* 80* 82*  HEPARINUNFRC  --   --  >1.10*  CREATININE 2.46*  --   --      Estimated Creatinine Clearance: 15.7 mL/min (A) (by C-G formula based on SCr of 2.46 mg/dL (H)).   Assessment: 81 YOM admitted for cellulitis now with concern for SBO on abdominal Xray. Plan for NG decompression and abd/pelvic CT for further eval. Eliquis transitioned to heparin while NPO. Last Eliquis dose 6/3 AM - will use aPTT to monitor until levels are correlating.  aPTT is therapeutic at 82 seconds.  CBC stable; no bleeding reported  Goal of Therapy:  Heparin level 0.3-0.7 units/ml aPTT 66-102 seconds Monitor platelets by anticoagulation protocol: Yes   Plan:  Continue heparin gtt at 1000 units/hr Daily aPTT, heparin level, CBC   [name], PharmD, BCPS, BCCCP 10/23/2022, 1:54 PM

## 2022-10-23 NOTE — Progress Notes (Signed)
Daily Progress Note   Patient Name: [name]       Date: 10/23/2022 DOB: Jul 18, 1923  Age: 89 y.o. MRN#: [id number] Attending Physician: Lonia Blood, MD Primary Care Physician: Mliss Sax, MD Admit Date: 10/17/2022  Reason for Consultation/Follow-up: Establishing goals of care  Subjective: Medical records reviewed including progress notes, labs, and imaging. Patient assessed at the bedside. He is sitting in bedside chair, denies pain. Discussed with RN. Patient has required 3 doses of PRN IV fentanyl in the past 24 hours. No family present during my visit.  Called patient's granddaughter [name] for ongoing palliative support and updates. Discussed his pain management strategies and that pain is currently adequately controlled. Discussed plans for additional imaging today and conservative management of pSBO. She continues to desire treatment for this so that he can enjoy foods at home with hospice. She also notes that patient's caregiver will be out of town for a graduation on Saturday and it may be best for patient to discharge after she returns on Sunday. Reviewed the hospice referral process again, explaining that they will likely reach out closer to discharge and set up an initial visit at home. She has much of the equipment she already needs to care for [facility].   Questions and concerns addressed. PMT will continue to support holistically.    Physical Exam Vitals and nursing note reviewed.  Constitutional:      Appearance: He is ill-appearing.     Interventions: Nasal cannula in place.  Cardiovascular:     Rate and Rhythm: Normal rate.  Skin:    General: Skin is warm and dry.  Neurological:     Mental Status: He is alert.  Psychiatric:        Behavior:  Behavior is cooperative.             Vital Signs: BP 102/78 (BP Location: Left Arm)   Pulse 86   Temp (!) 97.4 F (36.3 C) (Axillary)   Resp 19   Ht 5\' 11"  (1.803 m)   Wt 63.4 kg   SpO2 (!) 86%   BMI 19.49 kg/m  SpO2: SpO2: (!) 86 % O2 Device: O2 Device: Room Air O2 Flow Rate:         Palliative Assessment/Data: PPS: 20%    Palliative Care Assessment & Plan  Patient Profile/HPI:  89 y.o. male  with past medical history of peripheral artery disease, CHF, dementia, afib, tachybrady syndrome s/p pacmaker, CKD IV, recently admitted 5/20-5/28 with volume overload resulting in pericardial infusion (cardiocentesis with 1033 cc off), L toe gangrene, discharged home on 5/28 and readmitted on 10/17/2022 with L lower extremity pain. Workup reveals cellulitis and critical limb ischemia. Recommendations per vascular, podiatry and cardiology have been comfort focused. He is being treated with antibiotics and MRI is pending. Palliative consulted for further goals of care.    Assessment/Recommendations/Plan  Continue current plan of care Patient's pain is well controlled with fentanyl patch and PRN IV fentanyl Patient's family desire treatment for pSBO before discharge home with hospice TOC order has been previously placed for hospice referral at discharge Psychosocial and emotional support provided PMT will continue to follow and support   Code Status: DNR  Prognosis:  < 6 months  Discharge Planning: Home with Hospice  Care plan was discussed with patient, patient's granddaughter, RN    MDM: High   Neli Fofana Loleta Rose Palliative Medicine Team Team phone # [phone]  Thank you for allowing the Palliative Medicine Team to assist in the care of this patient. Please utilize secure chat with additional questions, if there is no response within 30 minutes please call the above phone number.  Palliative Medicine Team providers are available by phone from 7am to 7pm daily  and can be reached through the team cell phone.  Should this patient require assistance outside of these hours, please call the patient's attending physician.  Portions of this note are a verbal dictation therefore any spelling and/or grammatical errors are due to the "[employer]agon [employer] One" system interpretation.

## 2022-10-23 NOTE — Progress Notes (Addendum)
[facility] Progress Note     Subjective: CC:  States he feels better compared to yesterday. Says his stomach hurts less. He thinks he passed a little gas. Denies a BM. States he threw up "a little bit" yesterday. Denies nausea this morning.   Objective: Vital signs in last 24 hours: Temp:  [97.4 F (36.3 C)-98.2 F (36.8 C)] 97.4 F (36.3 C) (06/05 0425) Pulse Rate:  [28-118] 111 (06/05 0425) Resp:  [0-26] 22 (06/05 0425) BP: (86-102)/(60-78) 102/78 (06/05 0425) SpO2:  [69 %-100 %] 100 % (06/05 0425) Weight:  [63.4 kg] 63.4 kg (06/05 0500) Last BM Date : 10/21/22  Intake/Output from previous day: 06/04 0701 - 06/05 0700 In: 1006.1 [I.V.:856.1; IV Piggyback:150] Out: -  Intake/Output this shift: No intake/output data recorded.  PE: Gen:  Alert, NAD, pleasant Card:  Regular rate and rhythm, pedal pulses 2+ BL Pulm:  Normal effort or 1L Bradley Abd: Soft,mild distention, multiple small ventral hernias that are soft a temporarily reduce, abdomen is non-tender, previous laparotomy and R subcostal incision noted. Skin: warm and dry, no rashes  Psych: A&Ox3   Lab Results:  Recent Labs    10/22/22 0712 10/23/22 0348  WBC 9.5 9.9  HGB 10.4* 10.2*  HCT 31.9* 31.6*  PLT 215 213   BMET Recent Labs    10/22/22 0712  NA 137  K 4.9  CL 101  CO2 22  GLUCOSE 133*  BUN 48*  CREATININE 2.46*  CALCIUM 8.3*   PT/INR No results for input(s): "LABPROT", "INR" in the last 72 hours. CMP     Component Value Date/Time   NA 137 10/22/2022 0712   NA 144 01/29/2022 1156   K 4.9 10/22/2022 0712   CL 101 10/22/2022 0712   CO2 22 10/22/2022 0712   GLUCOSE 133 (H) 10/22/2022 0712   BUN 48 (H) 10/22/2022 0712   BUN 28 01/29/2022 1156   CREATININE 2.46 (H) 10/22/2022 0712   CREATININE 1.50 (H) 08/03/2021 1658   CALCIUM 8.3 (L) 10/22/2022 0712   PROT 5.8 (L) 10/22/2022 0712   ALBUMIN 2.4 (L) 10/22/2022 0712   AST 17 10/22/2022 0712   ALT 9 10/22/2022 0712   ALKPHOS  54 10/22/2022 0712   BILITOT 0.7 10/22/2022 0712   GFRNONAA 23 (L) 10/22/2022 0712   GFRAA 39 (L) 12/05/2017 0752   Lipase     Component Value Date/Time   LIPASE 33 07/29/2021 1141       Studies/Results: CT ABDOMEN PELVIS WO CONTRAST  Result Date: 10/22/2022 CLINICAL DATA:  Bowel obstruction suspected EXAM: CT ABDOMEN AND PELVIS WITHOUT CONTRAST TECHNIQUE: Multidetector CT imaging of the abdomen and pelvis was performed following the standard protocol without IV contrast. RADIATION DOSE REDUCTION: This exam was performed according to the departmental dose-optimization program which includes automated exposure control, adjustment of the mA and/or kV according to patient size and/or use of iterative reconstruction technique. COMPARISON:  Abdominal x-ray 10/21/2022. CT chest abdomen pelvis 07/06/2022 FINDINGS: Lower chest: Moderate right and large left pleural effusions are again seen with compressive atelectasis in the bilateral lower lobes, similar to prior. The heart is enlarged. There is a small pericardial effusion, also unchanged. Hepatobiliary: No focal liver abnormality is seen. Status post cholecystectomy. No biliary dilatation. Pancreas: There is atrophy of the pancreatic body and tail with ductal dilatation, unchanged from prior. Diffuse pancreatic calcifications are again seen compatible with chronic pancreatitis. No acute inflammation. Spleen: Normal in size without focal abnormality. Adrenals/Urinary Tract: No urinary tract calculus or  hydronephrosis. Bladder and adrenal glands are within normal limits. There are bilateral renal cortical hypodensities which are too small to characterize, favored as cysts. Stomach/Bowel: Small bowel staple line again noted in the right pelvis. There are anterior 2 ventral hernias containing small bowel. The more inferior is questionable transition point for small bowel obstruction. There are dilated small bowel loops with air-fluid levels in the central and  left abdomen measuring up to 5.7 cm. Bowel loops are more dilated proximal to the inferior hernia. Colon is nondilated. The appendix is within normal limits. Moderate-sized hiatal hernia is present. The stomach is otherwise within normal limits. There is no pneumatosis or free air. Mild mesenteric edema is present. Vascular/Lymphatic: Aortic atherosclerosis. No enlarged abdominal or pelvic lymph nodes. Fluid attenuation area in the retrocrural region the right of the distal esophagus appears unchanged measuring 2.2 x 2.4 cm, possibly a lymphocele. Reproductive: Prostate is small in size, unchanged. Other: Diffuse body wall edema is increased.  There is no ascites. Musculoskeletal: No acute osseous findings. IMPRESSION: 1. Findings compatible with small bowel obstruction with questionable transition point in the inferior ventral hernia. Findings may be related to incarcerated or strangulated hernia. 2. Stable moderate right and large left pleural effusions with compressive atelectasis. 3. Stable cardiomegaly with small pericardial effusion. 4. Increased diffuse body wall edema. 5. Subcentimeter Bosniak II renal cyst, too small to characterize. No follow-up imaging is recommended. JACR 2018 Feb; 264-273, Management of the Incidental Renal Mass on CT, [employer] 2021; 814-848, Bosniak Classification of Cystic Renal Masses, Version 2019. 6. Other chronic findings as above. Aortic Atherosclerosis (ICD10-I70.0). Electronically Signed   By: Darliss Cheney M.D.   On: 10/22/2022 16:37   DG Abd 1 View  Result Date: 10/21/2022 CLINICAL DATA:  Possible constipation EXAM: ABDOMEN - 1 VIEW COMPARISON:  07/08/2022 FINDINGS: Minimal retained fecal material is noted. Multiple dilated loops of small bowel are noted. No free air is seen. No mass lesion is noted. IMPRESSION: Changes consistent with at least a partial small bowel obstruction. CT may be helpful for further evaluation. These results were called by telephone at the  time of interpretation on 10/21/2022 at 7:04 pm to provider [facility] , who verbally acknowledged these results. Electronically Signed   By: Alcide Clever M.D.   On: 10/21/2022 19:04    Anti-infectives: Anti-infectives (From admission, onward)    Start     Dose/Rate Route Frequency Ordered Stop   10/23/22 2100  vancomycin (VANCOREADY) IVPB 750 mg/150 mL        750 mg 150 mL/hr over 60 Minutes Intravenous Every 48 hours 10/22/22 1107 10/23/22 2359   10/19/22 2100  vancomycin (VANCOCIN) IVPB 1000 mg/200 mL premix  Status:  Discontinued        1,000 mg 200 mL/hr over 60 Minutes Intravenous Every 48 hours 10/17/22 2246 10/22/22 1107   10/18/22 2100  ceFEPIme (MAXIPIME) 2 g in sodium chloride 0.9 % 100 mL IVPB        2 g 200 mL/hr over 30 Minutes Intravenous Every 24 hours 10/17/22 2246 10/23/22 2359   10/17/22 2045  vancomycin (VANCOCIN) IVPB 1000 mg/200 mL premix        1,000 mg 200 mL/hr over 60 Minutes Intravenous  Once 10/17/22 2036 10/17/22 2251   10/17/22 2045  ceFEPIme (MAXIPIME) 2 g in sodium chloride 0.9 % 100 mL IVPB        2 g 200 mL/hr over 30 Minutes Intravenous  Once 10/17/22 2036 10/17/22 2251  Assessment/Plan  16M with complex medical and surgical history and CT evidence of SBO  - afebrile, VSS - HR normal during my exam - clinically his abdomen is mildly distended but minimally tender, hernias temporarily reduce without signs of incarcerations/strangulation. He is a having a small amt of flatus consistent with pSBO. No emergent need for surgery. Continue to hold on NG placement. Will give oral gastrografin and check a follow up X-ray in 8 hours. Per RN he tolerated miralax yesterday without any issues or signs of aspiration so I think he can tolerate 90 mL of gastrografin.     LOS: 6 days   I reviewed hospitalist notes, last 24 h vitals and pain scores, last 48 h intake and output, last 24 h labs and trends, and last 24 h imaging results.    Hosie Spangle,  PA-C [facility] Please see Amion for pager number during day hours 7:00am-4:30pm

## 2022-10-23 NOTE — Progress Notes (Signed)
Occupational Therapy Treatment Patient Details Name: [name] MRN: [id number] DOB: Mar 03, 1924 Today's Date: 10/23/2022   History of present illness 89 yo male presenting to the ED with pain in left 1st toe. +fracture base of prox phalanx. MRI foot pending. Previously admitted 5/20 with SOb and pericardial effusion. 5/22 pericardiocentesis. PMhx: dementia, HTN, CHF, Afib, PPM, CVA, PAD, CKD, T2DM   OT comments  Patient continues to make steady progress towards goals in skilled OT session. Patient's session encompassed increasing activity tolerance with functional mobility. Patient requesting back to bed and perseverative on getting his back scratched. Patient min A for minimal mobility, declining further ambulation. Patient would greatly benefit to returning home ASAP to a familiar environment and with family support to improve current quality of life. OT will continue to follow, with recommendation remaining appropriate. OT will defer graciously to palliative for care plan decision.    Recommendations for follow up therapy are one component of a multi-disciplinary discharge planning process, led by the attending physician.  Recommendations may be updated based on patient status, additional functional criteria and insurance authorization.    Assistance Recommended at Discharge Frequent or constant Supervision/Assistance  Patient can return home with the following  A little help with walking and/or transfers;A lot of help with bathing/dressing/bathroom;Assistance with cooking/housework;Direct supervision/assist for medications management;Direct supervision/assist for financial management;Assist for transportation;Help with stairs or ramp for entrance   Equipment Recommendations  None recommended by OT (patient has DME needed)    Recommendations for Other Services      Precautions / Restrictions Precautions Precautions: Fall Precaution Comments: HOH, talk into R  ear Restrictions Weight Bearing Restrictions: No       Mobility Bed Mobility Overal bed mobility: Needs Assistance Bed Mobility: Sit to Supine     Supine to sit: Min assist     General bed mobility comments: min A for BLE management    Transfers Overall transfer level: Needs assistance Equipment used: Rolling walker (2 wheels) Transfers: Sit to/from Stand Sit to Stand: Min assist           General transfer comment: From recliner     Balance Overall balance assessment: Needs assistance Sitting-balance support: No upper extremity supported, Feet supported Sitting balance-Leahy Scale: Fair     Standing balance support: Bilateral upper extremity supported, Reliant on assistive device for balance Standing balance-Leahy Scale: Poor Standing balance comment: RW in standing                           ADL either performed or assessed with clinical judgement   ADL Overall ADL's : Needs assistance/impaired                         Toilet Transfer: Minimal assistance;Ambulation;Rolling walker (2 wheels) Toilet Transfer Details (indicate cue type and reason): simulated via functional mobility         Functional mobility during ADLs: Minimal assistance;Cueing for sequencing;Cueing for safety;Rolling walker (2 wheels) General ADL Comments: Session focus on increasing activity tolerance with functional mobility. Patient requesting back to bed and perseverative on getting his back scratched. Patient min A for minimal mobility, declining further ambulation. Patient would greatly benefit to returning home ASAP to a familiar environment and with family support to improve current quality of life. OT will continue to follow, with recommendation remaining appropriate. OT will defer graciously to palliative for care plan decision.    Extremity/Trunk Assessment  Cervical / Trunk Assessment Cervical / Trunk Assessment: Kyphotic    Vision        Perception     Praxis      Cognition Arousal/Alertness: Awake/alert Behavior During Therapy: WFL for tasks assessed/performed Overall Cognitive Status: History of cognitive impairments - at baseline                                 General Comments: A  & O x4, baseline cognitive deficits        Exercises      Shoulder Instructions       General Comments HR stable with minimal mobilty for OT    Pertinent Vitals/ Pain       Pain Assessment Pain Assessment: No/denies pain  Home Living                                          Prior Functioning/Environment              Frequency  Min 2X/week        Progress Toward Goals  OT Goals(current goals can now be found in the care plan section)     Acute Rehab OT Goals Patient Stated Goal: to get my back scratched OT Goal Formulation: Patient unable to participate in goal setting Time For Goal Achievement: 11/04/22 Potential to Achieve Goals: Good  Plan      Co-evaluation                 AM-PAC OT "6 Clicks" Daily Activity     Outcome Measure   Help from another person eating meals?: A Little Help from another person taking care of personal grooming?: A Little Help from another person toileting, which includes using toliet, bedpan, or urinal?: A Lot Help from another person bathing (including washing, rinsing, drying)?: A Lot Help from another person to put on and taking off regular upper body clothing?: A Lot Help from another person to put on and taking off regular lower body clothing?: A Little 6 Click Score: 15    End of Session Equipment Utilized During Treatment: Rolling walker (2 wheels)  OT Visit Diagnosis: Unsteadiness on feet (R26.81);Other abnormalities of gait and mobility (R26.89);Muscle weakness (generalized) (M62.81);Adult, failure to thrive (R62.7)   Activity Tolerance Patient limited by fatigue   Patient Left in bed;with call bell/phone within  reach;with bed alarm set   Nurse Communication Mobility status        Time: 4098-1191 OT Time Calculation (min): 11 min  Charges: OT General Charges $OT Visit: 1 Visit OT Treatments $Self Care/Home Management : 8-22 mins  Pollyann Glen E. [place] Gipe, OTR/L Acute Rehabilitation Services [phone]   Cherlyn Cushing 10/23/2022, 2:53 PM

## 2022-10-23 NOTE — Progress Notes (Addendum)
[name]  ZOX:[id number] DOB: 10-Mar-1924 DOA: 10/17/2022 PCP: Mliss Sax, MD    Brief Narrative:  89 year old with a history of PAF on Eliquis and SSS status post pacemaker, CKD stage IV with baseline creatinine 2.3, HTN, DM2, chronic systolic CHF, and anemia of chronic kidney disease who was admitted to [facility] 10/17/2022 with cellulitis of the left foot.  She has a chronic necrotic left great toe.  Of note she had been hospitalized 5/20-5/29/2024 with cardiac tamponade requiring pericardiocentesis.  Consultants:  General Surgery Palliative Care Cardiology Podiatry  Goals of Care:  Code Status: DNR   DVT prophylaxis: IV heparin  Interim Hx: Afebrile.  Mild tachycardia up to 120.  Resting comfortably at the time my visit.  No complaints.  Assessment & Plan:  Cellulitis left foot -chronic left hallux gangrene -PAD Followed in the outpatient setting by Dr. Kirke Corin with concern for popliteal and TP trunk occlusion -plain films reveal fracture at the base of the proximal phalanx of the left great toe but no evidence of osteomyelitis -empiric antibiotics continue - Podiatry evaluated and felt the patient was not a candidate for surgery - Cardiology evaluated and recommended comfort focused care with medical management only - Palliative Care has met with the patient's family, and hospice will be arranged for home with the patient to receive ongoing medical care until such time that he is able to be discharged  Recurrent SBO Acutely developed 6/3 -NG initially used but patient removed on his own -CT abdomen suggest incarcerated or strangulated hernia -General Surgery suggested medical maximization and monitoring clinically for now  Chronic paroxysmal atrial fibrillation on chronic Eliquis Rate reasonably well controlled -resume Eliquis as soon as renal function stabilizes as the patient will not be a candidate for aggressive interventions  Chronic systolic CHF EF  81-19% via TTE 10/10/2022 -well compensated at present  CKD stage IV Creatinine climbing in setting of SBO and poor intake -increase hydration and monitor  Anemia of CKD Hemoglobin stable  DM2 Continue with SSI  BPH Continue usual tamsulosin and finasteride  Chronic hypotension Requiring midodrine as an outpatient  Family Communication: No family present at time of exam Disposition: For discharge home with hospice care once SBO resolved   Objective: Blood pressure 102/78, pulse 86, temperature (!) 97.4 F (36.3 C), temperature source Axillary, resp. rate 19, height 5\' 11"  (1.803 m), weight 63.4 kg, SpO2 (!) 86 %.  Intake/Output Summary (Last 24 hours) at 10/23/2022 0925 Last data filed at 10/23/2022 0600 Gross per 24 hour  Intake 1006.12 ml  Output --  Net 1006.12 ml   Filed Weights   10/21/22 0441 10/22/22 0310 10/23/22 0500  Weight: 63.7 kg 63.4 kg 63.4 kg    Examination: General: No acute respiratory distress Lungs: Clear to auscultation bilaterally without wheezes or crackles Cardiovascular: Regular rate without murmur gallop or rub normal S1 and S2 Abdomen: Non-distended, soft, bowel sounds hypoactive, no rebound, no ascites, no appreciable mass Extremities: No significant cyanosis, clubbing, or edema bilateral lower extremities  CBC: Recent Labs  Lab 10/17/22 1110 10/18/22 0353 10/19/22 0215 10/20/22 0252 10/22/22 0712 10/23/22 0348  WBC 7.0 7.8   < > 7.5 9.5 9.9  NEUTROABS 4.9 5.5  --   --   --   --   HGB 10.3* 10.6*   < > 9.9* 10.4* 10.2*  HCT 32.5* 31.3*   < > 28.9* 31.9* 31.6*  MCV 78.5* 73.6*   < > 74.9* 76.0* 77.5*  PLT 176 126*   < >  186 215 213   < > = values in this interval not displayed.   Basic Metabolic Panel: Recent Labs  Lab 10/18/22 0353 10/19/22 0215 10/20/22 0252 10/22/22 0712  NA 139 137 136 137  K 4.3 4.2 4.2 4.9  CL 104 105 104 101  CO2 19* 22 22 22   GLUCOSE 159* 136* 192* 133*  BUN 38* 37* 37* 48*  CREATININE 2.05* 2.15*  1.97* 2.46*  CALCIUM 7.7* 7.6* 7.7* 8.3*  MG 2.1  --   --  2.2   GFR: Estimated Creatinine Clearance: 15.7 mL/min (A) (by C-G formula based on SCr of 2.46 mg/dL (H)).   Scheduled Meds:  carbamide peroxide  5 drop Both EARS BID   diatrizoate meglumine-sodium  90 mL Oral Once   fentaNYL  1 patch Transdermal Q72H   fluticasone  1 spray Each Nare Daily   insulin aspart  0-6 Units Subcutaneous TID WC   metoprolol tartrate  2.5 mg Intravenous Q6H   pantoprazole (PROTONIX) IV  40 mg Intravenous Q24H   polyethylene glycol  17 g Oral BID   Continuous Infusions:  ceFEPime (MAXIPIME) IV 2 g (10/22/22 2038)   heparin 1,000 Units/hr (10/22/22 1044)   lactated ringers 50 mL/hr at 10/22/22 1736   valproate sodium 125 mg (10/22/22 2157)   vancomycin       LOS: 6 days   Lonia Blood, MD [facility] Office  [phone] Pager - Text Page per Loretha Stapler  If 7PM-7AM, please contact night-coverage per Amion 10/23/2022, 9:25 AM

## 2022-10-23 NOTE — Progress Notes (Addendum)
Physical Therapy Treatment Patient Details Name: [name] MRN: [id number] DOB: 07-Feb-1924 Today's Date: 10/23/2022   History of Present Illness 89 yo male presenting to the ED with pain in left 1st toe. +fracture base of prox phalanx. MRI foot pending. Previously admitted 5/20 with SOb and pericardial effusion. 5/22 pericardiocentesis. PMhx: dementia, HTN, CHF, Afib, PPM, CVA, PAD, CKD, T2DM    PT Comments    Patient required encouragement to participate, however once began mobilizing he did better than previous session. Denies pain. Min guard to ambulate 50 ft with RW and no reports of pain. Continue to feel he will be able to return home. Noted likely home with hospice and in that case, would not receive follow-up therapies. (He is likely nearing his baseline).   Recommendations for follow up therapy are one component of a multi-disciplinary discharge planning process, led by the attending physician.  Recommendations may be updated based on patient status, additional functional criteria and insurance authorization.  Follow Up Recommendations       Assistance Recommended at Discharge Frequent or constant Supervision/Assistance  Patient can return home with the following A little help with walking and/or transfers;A lot of help with bathing/dressing/bathroom;Assistance with cooking/housework;Direct supervision/assist for medications management;Direct supervision/assist for financial management;Assist for transportation;Help with stairs or ramp for entrance   Equipment Recommendations  None recommended by PT    Recommendations for Other Services       Precautions / Restrictions Precautions Precautions: Fall Precaution Comments: HOH, talk into R ear Restrictions Weight Bearing Restrictions: No     Mobility  Bed Mobility Overal bed mobility: Needs Assistance Bed Mobility: Supine to Sit     Supine to sit: HOB elevated, Min assist     General bed mobility comments:  incr time, assist to raise torso    Transfers Overall transfer level: Needs assistance Equipment used: Rolling walker (2 wheels) Transfers: Sit to/from Stand Sit to Stand: Min assist           General transfer comment: bed at lowest height; assist for vertical translation    Ambulation/Gait Ambulation/Gait assistance: Min guard Gait Distance (Feet): 50 Feet Assistive device: Rolling walker (2 wheels) Gait Pattern/deviations: Step-through pattern, Decreased stride length Gait velocity: reduced     General Gait Details: small steps; limited by HR up to 133 bpm   Stairs             Wheelchair Mobility    Modified Rankin (Stroke Patients Only)       Balance Overall balance assessment: Needs assistance Sitting-balance support: No upper extremity supported, Feet supported Sitting balance-Leahy Scale: Fair     Standing balance support: Bilateral upper extremity supported, Reliant on assistive device for balance Standing balance-Leahy Scale: Poor Standing balance comment: RW in standing                            Cognition Arousal/Alertness: Awake/alert Behavior During Therapy: WFL for tasks assessed/performed Overall Cognitive Status: History of cognitive impairments - at baseline                                 General Comments: A  & O x4, baseline cognitive deficits        Exercises      General Comments General comments (skin integrity, edema, etc.): Limited by HR 130s with ambulation      Pertinent Vitals/Pain Pain Assessment Pain Assessment:  No/denies pain    Home Living                          Prior Function            PT Goals (current goals can now be found in the care plan section) Acute Rehab PT Goals Patient Stated Goal: none stated PT Goal Formulation: Patient unable to participate in goal setting Time For Goal Achievement: 11/04/22 Potential to Achieve Goals: Good Progress towards PT  goals: Progressing toward goals    Frequency    Min 3X/week      PT Plan Discharge plan needs to be updated    Co-evaluation              AM-PAC PT "6 Clicks" Mobility   Outcome Measure  Help needed turning from your back to your side while in a flat bed without using bedrails?: A Little Help needed moving from lying on your back to sitting on the side of a flat bed without using bedrails?: A Little Help needed moving to and from a bed to a chair (including a wheelchair)?: A Little Help needed standing up from a chair using your arms (e.g., wheelchair or bedside chair)?: A Little Help needed to walk in hospital room?: A Little Help needed climbing 3-5 steps with a railing? : A Lot 6 Click Score: 17    End of Session Equipment Utilized During Treatment: Gait belt Activity Tolerance: Treatment limited secondary to medical complications (Comment) (tachycardia) Patient left: in chair;with call bell/phone within reach;with chair alarm set Nurse Communication: Mobility status PT Visit Diagnosis: Muscle weakness (generalized) (M62.81)     Time: 4098-1191 PT Time Calculation (min) (ACUTE ONLY): 28 min  Charges:  $Gait Training: 23-37 mins                      [name], PT Acute Rehabilitation Services  Office [phone]    [name] 10/23/2022, 10:22 AM

## 2022-10-24 ENCOUNTER — Inpatient Hospital Stay (HOSPITAL_COMMUNITY): Payer: No Typology Code available for payment source

## 2022-10-24 DIAGNOSIS — M79672 Pain in left foot: Secondary | ICD-10-CM | POA: Diagnosis not present

## 2022-10-24 DIAGNOSIS — Z789 Other specified health status: Secondary | ICD-10-CM

## 2022-10-24 DIAGNOSIS — L03116 Cellulitis of left lower limb: Secondary | ICD-10-CM | POA: Diagnosis not present

## 2022-10-24 DIAGNOSIS — K56609 Unspecified intestinal obstruction, unspecified as to partial versus complete obstruction: Secondary | ICD-10-CM | POA: Diagnosis not present

## 2022-10-24 DIAGNOSIS — Z66 Do not resuscitate: Secondary | ICD-10-CM

## 2022-10-24 DIAGNOSIS — S92401A Displaced unspecified fracture of right great toe, initial encounter for closed fracture: Secondary | ICD-10-CM | POA: Diagnosis not present

## 2022-10-24 LAB — CBC WITH DIFFERENTIAL/PLATELET
Abs Immature Granulocytes: 0.08 10*3/uL — ABNORMAL HIGH (ref 0.00–0.07)
Basophils Absolute: 0 10*3/uL (ref 0.0–0.1)
Basophils Relative: 0 %
Eosinophils Absolute: 0 10*3/uL (ref 0.0–0.5)
Eosinophils Relative: 0 %
HCT: 29.1 % — ABNORMAL LOW (ref 39.0–52.0)
Hemoglobin: 9.8 g/dL — ABNORMAL LOW (ref 13.0–17.0)
Immature Granulocytes: 1 %
Lymphocytes Relative: 6 %
Lymphs Abs: 0.7 10*3/uL (ref 0.7–4.0)
MCH: 25.5 pg — ABNORMAL LOW (ref 26.0–34.0)
MCHC: 33.7 g/dL (ref 30.0–36.0)
MCV: 75.6 fL — ABNORMAL LOW (ref 80.0–100.0)
Monocytes Absolute: 1.5 10*3/uL — ABNORMAL HIGH (ref 0.1–1.0)
Monocytes Relative: 13 %
Neutro Abs: 9.6 10*3/uL — ABNORMAL HIGH (ref 1.7–7.7)
Neutrophils Relative %: 80 %
Platelets: 197 10*3/uL (ref 150–400)
RBC: 3.85 MIL/uL — ABNORMAL LOW (ref 4.22–5.81)
RDW: 17.5 % — ABNORMAL HIGH (ref 11.5–15.5)
WBC: 12 10*3/uL — ABNORMAL HIGH (ref 4.0–10.5)
nRBC: 0.2 % (ref 0.0–0.2)

## 2022-10-24 LAB — COMPREHENSIVE METABOLIC PANEL
ALT: 10 U/L (ref 0–44)
AST: 20 U/L (ref 15–41)
Albumin: 2.6 g/dL — ABNORMAL LOW (ref 3.5–5.0)
Alkaline Phosphatase: 62 U/L (ref 38–126)
Anion gap: 13 (ref 5–15)
BUN: 54 mg/dL — ABNORMAL HIGH (ref 8–23)
CO2: 22 mmol/L (ref 22–32)
Calcium: 8.6 mg/dL — ABNORMAL LOW (ref 8.9–10.3)
Chloride: 106 mmol/L (ref 98–111)
Creatinine, Ser: 2.92 mg/dL — ABNORMAL HIGH (ref 0.61–1.24)
GFR, Estimated: 19 mL/min — ABNORMAL LOW (ref >60)
Glucose, Bld: 134 mg/dL — ABNORMAL HIGH (ref 70–99)
Potassium: 3.8 mmol/L (ref 3.5–5.1)
Sodium: 141 mmol/L (ref 135–145)
Total Bilirubin: 0.6 mg/dL (ref 0.3–1.2)
Total Protein: 6.2 g/dL — ABNORMAL LOW (ref 6.5–8.1)

## 2022-10-24 LAB — BASIC METABOLIC PANEL
Anion gap: 13 (ref 5–15)
BUN: 49 mg/dL — ABNORMAL HIGH (ref 8–23)
CO2: 19 mmol/L — ABNORMAL LOW (ref 22–32)
Calcium: 8.3 mg/dL — ABNORMAL LOW (ref 8.9–10.3)
Chloride: 108 mmol/L (ref 98–111)
Creatinine, Ser: 2.96 mg/dL — ABNORMAL HIGH (ref 0.61–1.24)
GFR, Estimated: 19 mL/min — ABNORMAL LOW (ref >60)
Glucose, Bld: 120 mg/dL — ABNORMAL HIGH (ref 70–99)
Potassium: 3.9 mmol/L (ref 3.5–5.1)
Sodium: 140 mmol/L (ref 135–145)

## 2022-10-24 LAB — GLUCOSE, CAPILLARY
Glucose-Capillary: 113 mg/dL — ABNORMAL HIGH (ref 70–99)
Glucose-Capillary: 135 mg/dL — ABNORMAL HIGH (ref 70–99)
Glucose-Capillary: 166 mg/dL — ABNORMAL HIGH (ref 70–99)
Glucose-Capillary: 191 mg/dL — ABNORMAL HIGH (ref 70–99)
Glucose-Capillary: 48 mg/dL — ABNORMAL LOW (ref 70–99)
Glucose-Capillary: 82 mg/dL (ref 70–99)
Glucose-Capillary: 93 mg/dL (ref 70–99)

## 2022-10-24 LAB — CBC
HCT: 31.4 % — ABNORMAL LOW (ref 39.0–52.0)
Hemoglobin: 10.3 g/dL — ABNORMAL LOW (ref 13.0–17.0)
MCH: 24.8 pg — ABNORMAL LOW (ref 26.0–34.0)
MCHC: 32.8 g/dL (ref 30.0–36.0)
MCV: 75.7 fL — ABNORMAL LOW (ref 80.0–100.0)
Platelets: 223 10*3/uL (ref 150–400)
RBC: 4.15 MIL/uL — ABNORMAL LOW (ref 4.22–5.81)
RDW: 18.2 % — ABNORMAL HIGH (ref 11.5–15.5)
WBC: 11.8 10*3/uL — ABNORMAL HIGH (ref 4.0–10.5)
nRBC: 0.3 % — ABNORMAL HIGH (ref 0.0–0.2)

## 2022-10-24 LAB — MAGNESIUM
Magnesium: 2.2 mg/dL (ref 1.7–2.4)
Magnesium: 2.2 mg/dL (ref 1.7–2.4)

## 2022-10-24 LAB — APTT: aPTT: 126 seconds — ABNORMAL HIGH (ref 24–36)

## 2022-10-24 LAB — PHOSPHORUS: Phosphorus: 3.9 mg/dL (ref 2.5–4.6)

## 2022-10-24 LAB — HEPARIN LEVEL (UNFRACTIONATED): Heparin Unfractionated: 0.72 IU/mL — ABNORMAL HIGH (ref 0.30–0.70)

## 2022-10-24 MED ORDER — DEXTROSE 50 % IV SOLN
25.0000 g | INTRAVENOUS | Status: AC
  Administered 2022-10-24: 25 g via INTRAVENOUS
  Filled 2022-10-24: qty 50

## 2022-10-24 MED ORDER — SODIUM CHLORIDE 0.9 % IV BOLUS
500.0000 mL | Freq: Once | INTRAVENOUS | Status: AC
  Administered 2022-10-24: 500 mL via INTRAVENOUS

## 2022-10-24 MED ORDER — HYDROMORPHONE HCL 1 MG/ML IJ SOLN
0.5000 mg | INTRAMUSCULAR | Status: DC | PRN
  Administered 2022-10-24 – 2022-10-26 (×8): 0.5 mg via INTRAVENOUS
  Filled 2022-10-24 (×8): qty 0.5

## 2022-10-24 MED ORDER — HYDROMORPHONE HCL 1 MG/ML IJ SOLN: 0.5000 mg | INTRAMUSCULAR | Status: DC | PRN

## 2022-10-24 MED ORDER — FENTANYL 25 MCG/HR TD PT72
1.0000 | MEDICATED_PATCH | TRANSDERMAL | Status: DC
  Administered 2022-10-24 – 2022-10-27 (×2): 1 via TRANSDERMAL
  Filled 2022-10-24 (×2): qty 1

## 2022-10-24 MED ORDER — BISACODYL 10 MG RE SUPP
10.0000 mg | Freq: Once | RECTAL | Status: AC
  Administered 2022-10-24: 10 mg via RECTAL
  Filled 2022-10-24: qty 1

## 2022-10-24 NOTE — Progress Notes (Signed)
Daily Progress Note   Patient Name: [name]       Date: 10/24/2022 DOB: 05/25/1923  Age: 89 y.o. MRN#: [id number] Attending Physician: Lonia Blood, MD Primary Care Physician: Mliss Sax, MD Admit Date: 10/17/2022  Reason for Consultation/Follow-up: Establishing goals of care and Pain control  Subjective: I have reviewed medical records including EPIC notes, MAR, and labs. NGT was replaced last night due to increasing pain and no resolution or improvement with pSBO. Per Surgery, if patient does not improve in the next 24-48 hours it is unlikely he will be able to discharge home on an oral diet; patient would be a high risk operative candidate. Received report from primary RN - no acute concerns. RN reports patient has had unmanaged pain this morning despite administration of PRN fentanyl three times. RN reports patient is in "constant pain" in his legs, abdomen, and back.   Went to visit patient at bedside - male visitor present. Patient was resting in bed after recent administration of PRN fentanyl. Mitts are in place. No respiratory distress, increased work of breathing, or secretions noted.   12:30 PM Attempted to call granddaughter/HCPOA/Keturah to discuss updates, GOC, EOL wishes, disposition, and options - no answer - confidential voicemail left and PMT phone number provided with request to return call.   Length of Stay: 7  Current Medications: Scheduled Meds:   fentaNYL  1 patch Transdermal Q72H   fluticasone  1 spray Each Nare Daily   insulin aspart  0-9 Units Subcutaneous Q4H   metoprolol tartrate  2.5 mg Intravenous Q6H   pantoprazole (PROTONIX) IV  40 mg Intravenous Q24H    Continuous Infusions:  dextrose 5% lactated ringers 75 mL/hr at 10/24/22  1000   heparin 1,000 Units/hr (10/24/22 1000)   valproate sodium 125 mg (10/24/22 1152)    PRN Meds: acetaminophen **OR** acetaminophen, cetaphil, diphenhydrAMINE-zinc acetate, fentaNYL (SUBLIMAZE) injection, naLOXone (NARCAN)  injection, ondansetron (ZOFRAN) IV  Physical Exam Vitals and nursing note reviewed.  Constitutional:      General: He is not in acute distress.    Appearance: He is ill-appearing.  Pulmonary:     Effort: No respiratory distress.  Skin:    General: Skin is warm and dry.  Neurological:     Motor: Weakness present.  Vital Signs: BP 106/85 (BP Location: Right Arm)   Pulse (!) 116   Temp 98 F (36.7 C)   Resp 16   Ht 5\' 11"  (1.803 m)   Wt 63.4 kg   SpO2 100%   BMI 19.49 kg/m  SpO2: SpO2: 100 % O2 Device: O2 Device: Room Air O2 Flow Rate: O2 Flow Rate (L/min): 2 L/min  Intake/output summary:  Intake/Output Summary (Last 24 hours) at 10/24/2022 1239 Last data filed at 10/24/2022 1000 Gross per 24 hour  Intake 1884.03 ml  Output 800 ml  Net 1084.03 ml   LBM: Last BM Date : 10/21/22 Baseline Weight: Weight: 62 kg Most recent weight: Weight: 63.4 kg       Palliative Assessment/Data: PPS 10% - NPO status due to pSBO with NGT      Patient Active Problem List   Diagnosis Date Noted   Chronic systolic CHF (congestive heart failure) ([name]) 10/18/2022   Cellulitis of left lower extremity 10/17/2022   Pericardial effusion 10/08/2022   CHF (congestive heart failure) ([name]) 10/07/2022   Gangrene of toe of left foot ([name]) 10/07/2022   LVH (left ventricular hypertrophy) 09/11/2022   Coronary atherosclerosis 08/15/2022   Encounter for fitting and adjustment of hearing aid 08/15/2022   Hearing loss 08/15/2022   Hypertensive heart disease with heart failure ([name]) 08/15/2022   Muscle weakness (generalized) 08/15/2022   Need for assistance with personal care 08/15/2022   Encounter for issue of other medical certificate 86/57/8469   Encounter  for other administrative examinations 08/15/2022   Other specified hearing loss, bilateral 08/15/2022   Pain in joint, lower leg 08/15/2022   Problem related to unspecified psychosocial circumstances 08/15/2022   Cellulitis and abscess of toe of left foot 08/08/2022   Left leg cellulitis 08/08/2022   Bilateral pleural effusion 07/22/2022   DNR (do not resuscitate)/DNI(Do Not Intubate) 07/22/2022   Edema due to hypoalbuminemia 07/22/2022   Chronic hypoxic respiratory failure, on home oxygen therapy ([name]) - 4 L/min 07/22/2022   Pressure injury of skin 07/10/2022   Essential hypertension 07/06/2022   Alzheimer disease ([name]) 07/06/2022   Pleural effusion 07/06/2022   History of CVA (cerebrovascular accident) 07/06/2022   Ventral hernia 07/06/2022   Ulcer of lower extremity, limited to breakdown of skin ([name]) 05/30/2022   Urinary frequency 02/14/2022   Hypotension due to drugs 02/07/2022   Need for influenza vaccination 02/07/2022   History of anemia due to chronic kidney disease 12/31/2021   Agitation due to dementia ([name]) 12/25/2021   BPH (benign prostatic hyperplasia) 12/05/2021   Lower extremity edema 12/05/2021   Scrotum swelling 12/05/2021   Elevated homocysteine 11/30/2021   Low magnesium level 11/30/2021   Balanitis 11/30/2021   Gastroesophageal reflux disease 10/22/2021   Urinary tract infection 10/22/2021   Chronic atrial fibrillation with RVR ([name]) 07/30/2021   Hypophosphatemia 07/29/2021   Stage 3b chronic kidney disease ([name]) 04/25/2021   Continuous leakage of urine 04/25/2021   At high risk for injury related to fall 04/25/2021   Hypocalcemia 10/30/2020   Vitamin D deficiency 10/30/2020   Edema due to malnutrition ([name]) 10/30/2020   Unsteady gait when walking 09/21/2020   Sarcopenia 09/21/2020   B12 deficiency 01/27/2020   Acute on chronic diastolic CHF (congestive heart failure) ([name]) 03/09/2019   Pain in both feet 11/17/2018   Neuropathy 10/09/2018   Insomnia  10/09/2018   Slow transit constipation 06/18/2018   Iron deficiency anemia 11/25/2017   Decreased appetite 10/16/2017   Hearing difficulty of both ears  10/16/2017   Allergic rhinitis due to pollen 09/03/2017   Asymmetric SNHL (sensorineural hearing loss) 08/14/2017   Tinnitus aurium, left 08/14/2017   Ventricular tachycardia ([name]) 01/14/2017   Malnutrition of moderate degree 01/13/2017   Exertional dyspnea 01/12/2017   Hypertension    PVD (peripheral vascular disease) ([name]) 08/14/2016   CVA (cerebral infarction) 07/23/2015   Microcytic anemia 07/23/2015   Stroke (cerebrum) ([name]) 07/23/2015   CKD (chronic kidney disease) stage 4, GFR 15-29 ml/min ([name]) 07/23/2015   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 03/15/2015   Primary open-angle glaucoma, left eye, indeterminate stage 03/15/2015   PCO (posterior capsule opacification) 11/29/2014   Ptosis of eyelid 07/27/2012   POAG (primary open-angle glaucoma) 11/27/2011   Trichiasis 11/27/2011   PPM-[employer] 03/13/2010   DM2 (diabetes mellitus, type 2) ([name]) 11/14/2008   Paroxysmal atrial fibrillation ([name]) 11/14/2008   BRADYCARDIA-TACHYCARDIA SYNDROME s/p PPM 11/14/2008    Palliative Care Assessment & Plan   Patient Profile: 89 y.o. male with past medical history of peripheral artery disease, CHF, dementia, afib, tachybrady syndrome s/p pacmaker, CKD IV, recently admitted 5/20-5/28 with volume overload resulting in pericardial infusion (cardiocentesis with 1033 cc off), L toe gangrene, discharged home on 5/28 and readmitted on 10/17/2022 with L lower extremity pain. Workup reveals cellulitis and critical limb ischemia. Recommendations per vascular, podiatry and cardiology have been comfort focused. He is being treated with antibiotics and MRI is pending. Palliative consulted for further goals of care.   Assessment: Principal Problem:   Cellulitis of left lower extremity Active Problems:   DM2 (diabetes mellitus, type 2) ([name])   Paroxysmal atrial  fibrillation ([name])   CKD (chronic kidney disease) stage 4, GFR 15-29 ml/min ([name])   BPH (benign prostatic hyperplasia)   History of anemia due to chronic kidney disease   Chronic systolic CHF (congestive heart failure) ([name])   Concern about end of life  Recommendations/Plan: Unable to speak with HCPOA/granddaughter today Per previous discussions, goal is to treat pSBO hopefully non-operatively, then discharge patient home with hospice Continue DNR/DNI as previously documented - durable DNR form completed and placed in shadow chart. Copy was made and will be scanned into Vynca/ACP tab Increased fentanyl patch to q3 days Adjusted PRN IV fentanyl to PRN IV dilaudid for longer duration of effect PMT will continue to follow and support holistically  Goals of Care and Additional Recommendations: Limitations on Scope of Treatment: Full Scope Treatment  Code Status:    Code Status Orders  (From admission, onward)           Start     Ordered   10/17/22 2116  Do not attempt resuscitation (DNR)  Continuous       Question Answer Comment  If patient has no pulse and is not breathing Do Not Attempt Resuscitation   If patient has a pulse and/or is breathing: Medical Treatment Goals LIMITED ADDITIONAL INTERVENTIONS: Use medication/IV fluids and cardiac monitoring as indicated; Do not use intubation or mechanical ventilation (DNI), also provide comfort medications.  Transfer to Progressive/Stepdown as indicated, avoid Intensive Care.   Consent: Discussion documented in EHR or advanced directives reviewed      10/17/22 2116           Code Status History     Date Active Date Inactive Code Status Order ID Comments User Context   10/07/2022 1237 10/16/2022 1630 DNR [id number]  Clydie Braun, MD ED   08/08/2022 0019 08/09/2022 1636 DNR [id number]  Rometta Emery, MD ED   07/22/2022  2043 07/29/2022 1803 DNR [id number]  Carollee Herter, DO ED   07/22/2022 1955 07/22/2022 2043 DNR [id number]  Carollee Herter, DO ED   07/08/2022 1145 07/10/2022 1739 DNR [id number]  Dimple Nanas, MD Inpatient   07/06/2022 1629 07/08/2022 1145 Full Code [id number]  Orland Mustard, MD ED   07/06/2022 1614 07/06/2022 1629 Full Code [id number]  Orland Mustard, MD ED   02/04/2022 1444 02/04/2022 2142 Full Code [id number]  Marinus Maw, MD Inpatient   12/31/2021 1636 01/01/2022 1950 Full Code [id number]  Joseph Art, DO ED   07/29/2021 1252 08/01/2021 2233 Full Code [id number]  Bobette Mo, MD ED   12/01/2017 1750 12/05/2017 1725 Full Code [id number]  Randel Pigg, Dorma Russell, MD ED   01/12/2017 1000 01/17/2017 1356 Full Code [id number]  Alwyn Ren, MD Inpatient   08/14/2016 1733 08/15/2016 1641 Full Code [id number]  Iran Ouch, MD Inpatient   08/07/2016 1333 08/07/2016 2233 Full Code [id number]  Iran Ouch, MD Inpatient   07/23/2015 1610 07/25/2015 1803 Full Code [id number]  Gwenyth Bender, NP ED   08/17/2013 2011 08/19/2013 1744 Full Code [id number]  Henderson Cloud, MD Inpatient   02/15/2012 0025 02/18/2012 2115 Full Code [id number]  Ron Parker, MD Inpatient      Advance Directive Documentation    Flowsheet Row Most Recent Value  Type of Advance Directive Out of facility DNR (pink MOST or yellow form)  Pre-existing out of facility DNR order (yellow form or pink MOST form) --  "MOST" Form in Place? --       Prognosis:  < 6 months  Discharge Planning: Home with Hospice  Care plan was discussed with primary RN, Dr. Sharon Seller  Thank you for allowing the Palliative Medicine Team to assist in the care of this patient.  Haskel Khan, NP  Please contact Palliative Medicine Team phone at [phone] for questions and concerns.   *Portions of this note are a verbal dictation therefore any spelling and/or grammatical errors are due to the "[employer] One" system interpretation.

## 2022-10-24 NOTE — Progress Notes (Signed)
[name]  WUJ:[id number] DOB: 1924/04/12 DOA: 10/17/2022 PCP: Mliss Sax, MD    Brief Narrative:  89 year old with a history of PAF on Eliquis and SSS status post pacemaker, CKD stage IV with baseline creatinine 2.3, HTN, DM2, chronic systolic CHF, and anemia of chronic kidney disease who was admitted to [facility] 10/17/2022 with cellulitis of the left foot.  He has a chronic necrotic left great toe.  Of note he had been hospitalized 5/20-5/29/2024 with cardiac tamponade requiring pericardiocentesis.  Consultants:  General Surgery Palliative Care Cardiology Podiatry  Goals of Care:  Code Status: DNR   DVT prophylaxis: IV heparin  Interim Hx: No acute events reported overnight.  Afebrile.  Mildly tachycardic with heart rates 104-114.  Blood pressure modestly low with systolics 97-101.  Oxygen saturation 100% room air.  The patient experienced acute worsening of left lower quadrant abdominal pain yesterday evening prompting placement of an NG tube.  He is resting comfortably at the time of my visit.   Assessment & Plan:  Cellulitis left foot -chronic left hallux ischemia- PAD Followed in the outpatient setting by Dr. Kirke Corin with concern for popliteal and TP trunk occlusion - plain films reveal fracture at the base of the proximal phalanx of the left great toe but no evidence of osteomyelitis -empiric antibiotic course has been completed - Podiatry evaluated and felt the patient was not a candidate for surgery - Cardiology evaluated his PAD and recommended comfort focused care with medical management only - Palliative Care has met with the patient's family, and hospice will be arranged for home with the patient to receive ongoing medical care until such time that he is able to be discharged  Recurrent SBO Acutely developed 6/3 -NG initially used but patient removed on his own, then due to symptoms it had to be replaced the evening of 6/5 - CT abdomen suggested  incarcerated or strangulated hernia but Surgery did not feel physical exam was consistent with this - General Surgery continues to follow and direct treatment of this issue -tolerating NG at present time -appears more comfortable with meds having been adjusted by Palliative Care  Chronic paroxysmal atrial fibrillation on chronic Eliquis Rate reasonably well controlled -stop IV heparin today due to concern for GI bleeding with NG in place with patient previously having traumatically removed an NG  Chronic systolic CHF EF 45-50% via TTE 10/10/2022 -well compensated at present with no evidence of volume overload  CKD stage IV Creatinine climbing in setting of SBO and poor intake -increase hydration further today and monitor  Anemia of CKD Hemoglobin has declined slightly today -stopping IV heparin -monitor trend  DM2 Continue with SSI -CBG controlled while patient is n.p.o.  BPH Continue usual tamsulosin and finasteride  Chronic hypotension Requiring midodrine as an outpatient  Family Communication: No family present at time of exam Disposition: For discharge home with hospice care once SBO resolved   Objective: Blood pressure 96/65, pulse (!) 108, temperature 97.7 F (36.5 C), temperature source Oral, resp. rate 19, height 5\' 11"  (1.803 m), weight 63.4 kg, SpO2 100 %.  Intake/Output Summary (Last 24 hours) at 10/24/2022 1536 Last data filed at 10/24/2022 1000 Gross per 24 hour  Intake 1884.03 ml  Output 800 ml  Net 1084.03 ml   Filed Weights   10/21/22 0441 10/22/22 0310 10/23/22 0500  Weight: 63.7 kg 63.4 kg 63.4 kg    Examination: General: No acute respiratory distress Lungs: Clear to auscultation bilaterally  Cardiovascular: Regular rate without  murmur gallop or rub normal S1 and S2 Abdomen: Non-distended, soft, bowel sounds hypoactive, no rebound, no ascites, no appreciable mass Extremities: No significant edema bilateral lower extremities  CBC: Recent Labs  Lab  10/18/22 0353 10/19/22 0215 10/23/22 0348 10/24/22 1010 10/24/22 1400  WBC 7.8   < > 9.9 11.8* 12.0*  NEUTROABS 5.5  --   --   --  9.6*  HGB 10.6*   < > 10.2* 10.3* 9.8*  HCT 31.3*   < > 31.6* 31.4* 29.1*  MCV 73.6*   < > 77.5* 75.7* 75.6*  PLT 126*   < > 213 223 197   < > = values in this interval not displayed.   Basic Metabolic Panel: Recent Labs  Lab 10/22/22 0712 10/23/22 0344 10/24/22 1010 10/24/22 1400  NA 137  --  141 140  K 4.9  --  3.8 3.9  CL 101  --  106 108  CO2 22  --  22 19*  GLUCOSE 133*  --  134* 120*  BUN 48*  --  54* 49*  CREATININE 2.46*  --  2.92* 2.96*  CALCIUM 8.3*  --  8.6* 8.3*  MG 2.2  --  2.2 2.2  PHOS  --  3.9 3.9  --    GFR: Estimated Creatinine Clearance: 13.1 mL/min (A) (by C-G formula based on SCr of 2.96 mg/dL (H)).   Scheduled Meds:  fentaNYL  1 patch Transdermal Q72H   fluticasone  1 spray Each Nare Daily   insulin aspart  0-9 Units Subcutaneous Q4H   metoprolol tartrate  2.5 mg Intravenous Q6H   pantoprazole (PROTONIX) IV  40 mg Intravenous Q24H   Continuous Infusions:  dextrose 5% lactated ringers 75 mL/hr at 10/24/22 1000   valproate sodium 125 mg (10/24/22 1152)     LOS: 7 days   Lonia Blood, MD [facility] Office  [phone] Pager - Text Page per Loretha Stapler  If 7PM-7AM, please contact night-coverage per Amion 10/24/2022, 3:36 PM

## 2022-10-24 NOTE — Progress Notes (Signed)
Brief Nutrition Note  RD drawn to pt's chart due to history of malnutrition and NPO since 6/03. Pt currently admitted with pSBO. Noted NG tube was replaced last night due to increasing pain and no resolution or improvement with pSBO.  Per review of notes, pt's end goal is to discharge with hospice. Discussed pt's nutrition plan of care with Surgery PA and with Palliative Medicine NP. Per discussion with Palliative Medicine NP, pt's family wants to treat pSBO with goal of pt being able to receive comfort feeds.  TPN is not recommended in this case. Per discussion with Palliative Medicine NP, hospice will not accept pt with TPN, and it is not considered a comfort/end-of-life intervention. TPN is considered life-prolonging, which is not within pt's goals of care at this time.  Appreciate discussion and insight from Palliative Medicine NP in this pt's case.  No nutrition interventions planned at this time. Please consult as needed.    Mertie Clause, MS, RD, LDN Inpatient Clinical Dietitian Please see AMiON for contact information.

## 2022-10-24 NOTE — Progress Notes (Addendum)
[facility] Progress Note     Subjective: CC:  Laying flat, finishing up a bath by the NT. NGT placed overnight due to increased pain and no signs of resolution of SBO.  This morning the patient is begging for help and endorses severe mid back pain. Denies this pain at baseline.   Objective: Vital signs in last 24 hours: Temp:  [97.6 F (36.4 C)-98.5 F (36.9 C)] 98 F (36.7 C) (06/06 0754) Pulse Rate:  [98-120] 116 (06/06 0800) Resp:  [14-26] 14 (06/06 0800) BP: (91-127)/(61-99) 91/67 (06/06 0800) SpO2:  [91 %-100 %] 100 % (06/06 0800) Last BM Date : 10/21/22  Intake/Output from previous day: 06/05 0701 - 06/06 0700 In: -  Out: 800 [Emesis/NG output:800] Intake/Output this shift: No intake/output data recorded.  PE: Gen:  Alert, appears uncomfortable Card:  HR 115-127 bpm during my exam and irregular,  Pulm:  slightly labored ORA Abd: Soft, moderately distended, multiple small ventral hernias that are soft a temporarily reduce, abdomen is non-tender without rigidity or guarding, previous laparotomy and R subcostal incision noted. Skin: warm and dry, no rashes  Psych: A&Ox3 - person, [place], 2024, unsure why he was hospitalized.  Lab Results:  Recent Labs    10/22/22 0712 10/23/22 0348  WBC 9.5 9.9  HGB 10.4* 10.2*  HCT 31.9* 31.6*  PLT 215 213   BMET Recent Labs    10/22/22 0712  NA 137  K 4.9  CL 101  CO2 22  GLUCOSE 133*  BUN 48*  CREATININE 2.46*  CALCIUM 8.3*   PT/INR No results for input(s): "LABPROT", "INR" in the last 72 hours. CMP     Component Value Date/Time   NA 137 10/22/2022 0712   NA 144 01/29/2022 1156   K 4.9 10/22/2022 0712   CL 101 10/22/2022 0712   CO2 22 10/22/2022 0712   GLUCOSE 133 (H) 10/22/2022 0712   BUN 48 (H) 10/22/2022 0712   BUN 28 01/29/2022 1156   CREATININE 2.46 (H) 10/22/2022 0712   CREATININE 1.50 (H) 08/03/2021 1658   CALCIUM 8.3 (L) 10/22/2022 0712   PROT 5.8 (L) 10/22/2022 0712   ALBUMIN  2.4 (L) 10/22/2022 0712   AST 17 10/22/2022 0712   ALT 9 10/22/2022 0712   ALKPHOS 54 10/22/2022 0712   BILITOT 0.7 10/22/2022 0712   GFRNONAA 23 (L) 10/22/2022 0712   GFRAA 39 (L) 12/05/2017 0752   Lipase     Component Value Date/Time   LIPASE 33 07/29/2021 1141       Studies/Results: CT ABDOMEN PELVIS WO CONTRAST  Addendum Date: 10/23/2022   ADDENDUM REPORT: 10/23/2022 21:29 ADDENDUM: These results were called by telephone at the time of interpretation on 10/23/2022 at 4:58 p.m. to provider RN Theotis Barrio, who verbally acknowledged these results. Electronically Signed   By: Darliss Cheney M.D.   On: 10/23/2022 21:29   Result Date: 10/23/2022 CLINICAL DATA:  Bowel obstruction suspected EXAM: CT ABDOMEN AND PELVIS WITHOUT CONTRAST TECHNIQUE: Multidetector CT imaging of the abdomen and pelvis was performed following the standard protocol without IV contrast. RADIATION DOSE REDUCTION: This exam was performed according to the departmental dose-optimization program which includes automated exposure control, adjustment of the mA and/or kV according to patient size and/or use of iterative reconstruction technique. COMPARISON:  Abdominal x-ray 10/21/2022. CT chest abdomen pelvis 07/06/2022 FINDINGS: Lower chest: Moderate right and large left pleural effusions are again seen with compressive atelectasis in the bilateral lower lobes, similar to prior. The heart is enlarged.  There is a small pericardial effusion, also unchanged. Hepatobiliary: No focal liver abnormality is seen. Status post cholecystectomy. No biliary dilatation. Pancreas: There is atrophy of the pancreatic body and tail with ductal dilatation, unchanged from prior. Diffuse pancreatic calcifications are again seen compatible with chronic pancreatitis. No acute inflammation. Spleen: Normal in size without focal abnormality. Adrenals/Urinary Tract: No urinary tract calculus or hydronephrosis. Bladder and adrenal glands are within normal limits. There  are bilateral renal cortical hypodensities which are too small to characterize, favored as cysts. Stomach/Bowel: Small bowel staple line again noted in the right pelvis. There are anterior 2 ventral hernias containing small bowel. The more inferior is questionable transition point for small bowel obstruction. There are dilated small bowel loops with air-fluid levels in the central and left abdomen measuring up to 5.7 cm. Bowel loops are more dilated proximal to the inferior hernia. Colon is nondilated. The appendix is within normal limits. Moderate-sized hiatal hernia is present. The stomach is otherwise within normal limits. There is no pneumatosis or free air. Mild mesenteric edema is present. Vascular/Lymphatic: Aortic atherosclerosis. No enlarged abdominal or pelvic lymph nodes. Fluid attenuation area in the retrocrural region the right of the distal esophagus appears unchanged measuring 2.2 x 2.4 cm, possibly a lymphocele. Reproductive: Prostate is small in size, unchanged. Other: Diffuse body wall edema is increased.  There is no ascites. Musculoskeletal: No acute osseous findings. IMPRESSION: 1. Findings compatible with small bowel obstruction with questionable transition point in the inferior ventral hernia. Findings may be related to incarcerated or strangulated hernia. 2. Stable moderate right and large left pleural effusions with compressive atelectasis. 3. Stable cardiomegaly with small pericardial effusion. 4. Increased diffuse body wall edema. 5. Subcentimeter Bosniak II renal cyst, too small to characterize. No follow-up imaging is recommended. JACR 2018 Feb; 264-273, Management of the Incidental Renal Mass on CT, [employer] 2021; 814-848, Bosniak Classification of Cystic Renal Masses, Version 2019. 6. Other chronic findings as above. Aortic Atherosclerosis (ICD10-I70.0). Electronically Signed: By: Darliss Cheney M.D. On: 2022/10/23 16:37   DG Abd 1 View  Result Date: 10/23/2022 CLINICAL DATA:   NG tube placement EXAM: ABDOMEN - 1 VIEW COMPARISON:  10/23/2010 FINDINGS: NG tube in the lower chest, likely within the hiatal hernia. Bilateral effusions and airspace disease again noted. IMPRESSION: NG tube in the lower chest, likely within the hiatal hernia. Electronically Signed   By: Charlett Nose M.D.   On: 10/23/2022 19:41   DG Abd Portable 1V  Result Date: 10/23/2022 CLINICAL DATA:  Small-bowel obstruction EXAM: PORTABLE ABDOMEN - 1 VIEW COMPARISON:  Previous studies including the CT done on 10-23-22 FINDINGS: There is abnormal dilation of small bowel loops measuring up to 5.9 cm in diameter in left lower abdomen. There is homogeneous opacity, possibly dilated distal small bowel loop measuring up to 7.5 cm in diameter. Gas and stool are present in ascending, descending and sigmoid colon. Rectum is not included in its entirety in the image. Heart is enlarged in size. There is increased density in the lower lung fields, possibly due to pleural effusions and underlying infiltrates. IMPRESSION: Findings suggest high-grade partial small bowel obstruction. Small amount of gas and stool are present in the ascending, descending and sigmoid colon. Electronically Signed   By: Ernie Avena M.D.   On: 10/23/2022 16:56    Anti-infectives: Anti-infectives (From admission, onward)    Start     Dose/Rate Route Frequency Ordered Stop   10/23/22 2100  vancomycin (VANCOREADY) IVPB 750 mg/150 mL  Status:  Discontinued        750 mg 150 mL/hr over 60 Minutes Intravenous Every 48 hours 10/22/22 1107 10/23/22 1600   10/19/22 2100  vancomycin (VANCOCIN) IVPB 1000 mg/200 mL premix  Status:  Discontinued        1,000 mg 200 mL/hr over 60 Minutes Intravenous Every 48 hours 10/17/22 2246 10/22/22 1107   10/18/22 2100  ceFEPIme (MAXIPIME) 2 g in sodium chloride 0.9 % 100 mL IVPB  Status:  Discontinued        2 g 200 mL/hr over 30 Minutes Intravenous Every 24 hours 10/17/22 2246 10/23/22 1600   10/17/22 2045   vancomycin (VANCOCIN) IVPB 1000 mg/200 mL premix        1,000 mg 200 mL/hr over 60 Minutes Intravenous  Once 10/17/22 2036 10/17/22 2251   10/17/22 2045  ceFEPIme (MAXIPIME) 2 g in sodium chloride 0.9 % 100 mL IVPB        2 g 200 mL/hr over 30 Minutes Intravenous  Once 10/17/22 2036 10/17/22 2251        Assessment/Plan  88M with complex medical and surgical history and CT evidence of SBO  - afebrile, tachycardic HR 115-120's during my exam (with that being said he has not received any IV lopressor today) BP 96-115 systolic which seems to be normal to high for this patient. - clinically he remains obstructed without peritonitis or signs of bowel ischemia. The multiple ventral hernias do not appear to be the point of obstruction based on exam, could be due to adhesions. NGT placed overnight. Recommend continuing NGT to LIWS. Will give dulcolax suppository. Could consider repeat SBO protocol but I will hold off for now and speak to my attending. No emergent surgical needs today.  Appreciate palliative team helping with GOC - at this point his obstruction is not resolving. I do not see him being able to go home on an oral diet unless he improves in the next 24-48 hours with NGT decompression. Given his age, multiple medical problems, significant surgical history, and frailty he would be a high risk operative candidate - high risk for bowel resection, bowel injury, issues with anastomotic or wound healing. He also has significant cardiac risk factors.     LOS: 7 days   I reviewed hospitalist notes, last 24 h vitals and pain scores, last 48 h intake and output, last 24 h labs and trends, and last 24 h imaging results.    Hosie Spangle, PA-C [facility] Please see Amion for pager number during day hours 7:00am-4:30pm

## 2022-10-25 ENCOUNTER — Inpatient Hospital Stay (HOSPITAL_COMMUNITY): Payer: No Typology Code available for payment source

## 2022-10-25 DIAGNOSIS — L03116 Cellulitis of left lower limb: Secondary | ICD-10-CM | POA: Diagnosis not present

## 2022-10-25 DIAGNOSIS — M79672 Pain in left foot: Secondary | ICD-10-CM | POA: Diagnosis not present

## 2022-10-25 DIAGNOSIS — Z711 Person with feared health complaint in whom no diagnosis is made: Secondary | ICD-10-CM

## 2022-10-25 DIAGNOSIS — S92401A Displaced unspecified fracture of right great toe, initial encounter for closed fracture: Secondary | ICD-10-CM | POA: Diagnosis not present

## 2022-10-25 DIAGNOSIS — K56609 Unspecified intestinal obstruction, unspecified as to partial versus complete obstruction: Secondary | ICD-10-CM | POA: Diagnosis not present

## 2022-10-25 LAB — BASIC METABOLIC PANEL
Anion gap: 17 — ABNORMAL HIGH (ref 5–15)
BUN: 48 mg/dL — ABNORMAL HIGH (ref 8–23)
CO2: 20 mmol/L — ABNORMAL LOW (ref 22–32)
Calcium: 8.6 mg/dL — ABNORMAL LOW (ref 8.9–10.3)
Chloride: 106 mmol/L (ref 98–111)
Creatinine, Ser: 2.83 mg/dL — ABNORMAL HIGH (ref 0.61–1.24)
GFR, Estimated: 20 mL/min — ABNORMAL LOW (ref >60)
Glucose, Bld: 86 mg/dL (ref 70–99)
Potassium: 4 mmol/L (ref 3.5–5.1)
Sodium: 143 mmol/L (ref 135–145)

## 2022-10-25 LAB — GLUCOSE, CAPILLARY
Glucose-Capillary: 11 mg/dL — CL (ref 70–99)
Glucose-Capillary: 125 mg/dL — ABNORMAL HIGH (ref 70–99)
Glucose-Capillary: 128 mg/dL — ABNORMAL HIGH (ref 70–99)
Glucose-Capillary: 148 mg/dL — ABNORMAL HIGH (ref 70–99)
Glucose-Capillary: 43 mg/dL — CL (ref 70–99)
Glucose-Capillary: 45 mg/dL — ABNORMAL LOW (ref 70–99)
Glucose-Capillary: 78 mg/dL (ref 70–99)
Glucose-Capillary: 82 mg/dL (ref 70–99)

## 2022-10-25 LAB — CBC
HCT: 33.1 % — ABNORMAL LOW (ref 39.0–52.0)
Hemoglobin: 10.9 g/dL — ABNORMAL LOW (ref 13.0–17.0)
MCH: 25.5 pg — ABNORMAL LOW (ref 26.0–34.0)
MCHC: 32.9 g/dL (ref 30.0–36.0)
MCV: 77.5 fL — ABNORMAL LOW (ref 80.0–100.0)
Platelets: 188 10*3/uL (ref 150–400)
RBC: 4.27 MIL/uL (ref 4.22–5.81)
RDW: 18 % — ABNORMAL HIGH (ref 11.5–15.5)
WBC: 14.7 10*3/uL — ABNORMAL HIGH (ref 4.0–10.5)
nRBC: 0.2 % (ref 0.0–0.2)

## 2022-10-25 LAB — GLUCOSE, RANDOM: Glucose, Bld: 147 mg/dL — ABNORMAL HIGH (ref 70–99)

## 2022-10-25 LAB — MAGNESIUM: Magnesium: 2.2 mg/dL (ref 1.7–2.4)

## 2022-10-25 LAB — PHOSPHORUS: Phosphorus: 3.5 mg/dL (ref 2.5–4.6)

## 2022-10-25 MED ORDER — DEXTROSE 50 % IV SOLN: 25.0000 g | INTRAVENOUS | Status: AC

## 2022-10-25 MED ORDER — DEXTROSE 50 % IV SOLN
INTRAVENOUS | Status: AC
  Administered 2022-10-25: 25 g via INTRAVENOUS
  Filled 2022-10-25: qty 50

## 2022-10-25 NOTE — Progress Notes (Signed)
Physical Therapy Treatment Patient Details Name: [name] MRN: [id number] DOB: Oct 15, 1923 Today's Date: 10/25/2022   History of Present Illness 89 yo male presenting to the ED with pain in left 1st toe. +fracture base of prox phalanx. MRI foot pending. Previously admitted 5/20 with SOb and pericardial effusion. 5/22 pericardiocentesis. PMhx: dementia, HTN, CHF, Afib, PPM, CVA, PAD, CKD, T2DM    PT Comments    Patient needing increased assist this session for ambulation into hallway then back to bed.  Noting HR up to 120's with ambulation and needing assist for monitoring fatigue.  Patient perseverative on phrases during session.  No family at the bedside, now with NGT to suction.  PT will continue to follow.    Recommendations for follow up therapy are one component of a multi-disciplinary discharge planning process, led by the attending physician.  Recommendations may be updated based on patient status, additional functional criteria and insurance authorization.  Follow Up Recommendations       Assistance Recommended at Discharge Frequent or constant Supervision/Assistance  Patient can return home with the following A little help with walking and/or transfers;A lot of help with bathing/dressing/bathroom;Assistance with cooking/housework;Direct supervision/assist for medications management;Direct supervision/assist for financial management;Assist for transportation;Help with stairs or ramp for entrance   Equipment Recommendations  None recommended by PT    Recommendations for Other Services       Precautions / Restrictions Precautions Precautions: Fall Precaution Comments: HOH, talk into R ear, NGT to suction     Mobility  Bed Mobility Overal bed mobility: Needs Assistance Bed Mobility: Supine to Sit, Sit to Supine     Supine to sit: Mod assist Sit to supine: Mod assist   General bed mobility comments: assist for lifting trunk to supine assist for legs onto bed     Transfers Overall transfer level: Needs assistance Equipment used: Rolling walker (2 wheels) Transfers: Sit to/from Stand Sit to Stand: Mod assist           General transfer comment: lifting help from EOB with posterior bias    Ambulation/Gait Ambulation/Gait assistance: Min assist Gait Distance (Feet): 50 Feet Assistive device: Rolling walker (2 wheels) Gait Pattern/deviations: Step-to pattern, Step-through pattern, Shuffle, Decreased dorsiflexion - left, Trunk flexed       General Gait Details: L foot dragging more than R cues for L foot clearance, assist for walker/direction, assist for balance, increased time pt perseverative repeating same words over throughout; HR up to 125   Stairs             Wheelchair Mobility    Modified Rankin (Stroke Patients Only)       Balance Overall balance assessment: Needs assistance   Sitting balance-Leahy Scale: Fair     Standing balance support: Bilateral upper extremity supported Standing balance-Leahy Scale: Poor Standing balance comment: initial posterior bias                            Cognition Arousal/Alertness: Awake/alert Behavior During Therapy: WFL for tasks assessed/performed (perseverative) Overall Cognitive Status: History of cognitive impairments - at baseline                                          Exercises      General Comments        Pertinent Vitals/Pain Pain Assessment Breathing: occasional labored breathing, short period  of hyperventilation Negative Vocalization: occasional moan/groan, low speech, negative/disapproving quality Facial Expression: sad, frightened, frown Body Language: tense, distressed pacing, fidgeting Consolability: distracted or reassured by voice/touch PAINAD Score: 5    Home Living                          Prior Function            PT Goals (current goals can now be found in the care plan section) Progress towards  PT goals: Not progressing toward goals - comment    Frequency    Min 3X/week      PT Plan Current plan remains appropriate    Co-evaluation              AM-PAC PT "6 Clicks" Mobility   Outcome Measure  Help needed turning from your back to your side while in a flat bed without using bedrails?: A Lot Help needed moving from lying on your back to sitting on the side of a flat bed without using bedrails?: A Lot Help needed moving to and from a bed to a chair (including a wheelchair)?: A Lot Help needed standing up from a chair using your arms (e.g., wheelchair or bedside chair)?: A Lot Help needed to walk in hospital room?: A Lot Help needed climbing 3-5 steps with a railing? : Total 6 Click Score: 11    End of Session Equipment Utilized During Treatment: Gait belt Activity Tolerance: Treatment limited secondary to medical complications (Comment) (HR elevation) Patient left: in bed;with call bell/phone within reach;with bed alarm set   PT Visit Diagnosis: Muscle weakness (generalized) (M62.81)     Time: 4098-1191 PT Time Calculation (min) (ACUTE ONLY): 32 min  Charges:  $Gait Training: 8-22 mins $Therapeutic Activity: 8-22 mins                     Sheran Lawless, PT Acute Rehabilitation Services Office:[phone] 10/25/2022    [name] 10/25/2022, 6:36 PM

## 2022-10-25 NOTE — Progress Notes (Signed)
[facility] Progress Note     Subjective: CC:  Resting this morning, no complaints  Objective: Vital signs in last 24 hours: Temp:  [97.6 F (36.4 C)-98.5 F (36.9 C)] 97.8 F (36.6 C) (06/07 0757) Pulse Rate:  [101-116] 109 (06/07 0757) Resp:  [16-36] 23 (06/07 0757) BP: (96-125)/(65-102) 103/77 (06/07 0757) SpO2:  [98 %-100 %] 99 % (06/07 0757) Last BM Date : 10/21/22  Intake/Output from previous day: 06/06 0701 - 06/07 0700 In: 2891.4 [I.V.:2185; IV Piggyback:706.5] Out: 600 [Urine:400; Emesis/NG output:200] Intake/Output this shift: No intake/output data recorded.  PE: Gen:  Alert, appears uncomfortable Card:  HR 115-127 bpm during my exam and irregular,  Pulm:  slightly labored ORA Abd: Soft, moderately distended, multiple small ventral hernias that are soft a temporarily reduce, abdomen is non-tender without rigidity or guarding, previous laparotomy and R subcostal incision noted. Skin: warm and dry, no rashes  Psych: A&Ox3 - person, [place], 2024, unsure why he was hospitalized.  Lab Results:  Recent Labs    10/24/22 1010 10/24/22 1400  WBC 11.8* 12.0*  HGB 10.3* 9.8*  HCT 31.4* 29.1*  PLT 223 197    BMET Recent Labs    10/24/22 1400 10/25/22 0745  NA 140 143  K 3.9 4.0  CL 108 106  CO2 19* 20*  GLUCOSE 120* 86  BUN 49* 48*  CREATININE 2.96* 2.83*  CALCIUM 8.3* 8.6*    PT/INR No results for input(s): "LABPROT", "INR" in the last 72 hours. CMP     Component Value Date/Time   NA 143 10/25/2022 0745   NA 144 01/29/2022 1156   K 4.0 10/25/2022 0745   CL 106 10/25/2022 0745   CO2 20 (L) 10/25/2022 0745   GLUCOSE 86 10/25/2022 0745   BUN 48 (H) 10/25/2022 0745   BUN 28 01/29/2022 1156   CREATININE 2.83 (H) 10/25/2022 0745   CREATININE 1.50 (H) 08/03/2021 1658   CALCIUM 8.6 (L) 10/25/2022 0745   PROT 6.2 (L) 10/24/2022 1010   ALBUMIN 2.6 (L) 10/24/2022 1010   AST 20 10/24/2022 1010   ALT 10 10/24/2022 1010   ALKPHOS 62  10/24/2022 1010   BILITOT 0.6 10/24/2022 1010   GFRNONAA 20 (L) 10/25/2022 0745   GFRAA 39 (L) 12/05/2017 0752   Lipase     Component Value Date/Time   LIPASE 33 07/29/2021 1141       Studies/Results: DG Abd Portable 1V  Result Date: 10/24/2022 CLINICAL DATA:  small bowel obstruction. EXAM: PORTABLE ABDOMEN - 1 VIEW COMPARISON:  10/23/2022 FINDINGS: Gaseous small bowel distension persists with small bowel loops in the left abdomen measuring up to 4.2 cm diameter. Probable fluid-filled dilated small bowel left abdomen. There does appear to be some contrast material in the distal colon and rectum. IMPRESSION: Persistent gaseous small bowel distension with some contrast material in the distal colon and rectum. Electronically Signed   By: Kennith Center M.D.   On: 10/24/2022 13:47   DG CHEST PORT 1 VIEW  Result Date: 10/24/2022 CLINICAL DATA:  Small bowel obstruction. EXAM: PORTABLE CHEST 1 VIEW COMPARISON:  Oct 08, 2022. FINDINGS: Stable cardiomegaly. Nasogastric tube tip is seen in expected position of stomach. Bilateral pleural effusions are noted with adjacent subsegmental atelectasis or edema. Left-sided pacemaker is in good position. Bony thorax is unremarkable. IMPRESSION: Stable bibasilar opacities and pleural effusions. Electronically Signed   By: Lupita Raider M.D.   On: 10/24/2022 13:42   DG Abd 1 View  Result Date: 10/23/2022  CLINICAL DATA:  NG tube placement EXAM: ABDOMEN - 1 VIEW COMPARISON:  10/23/2010 FINDINGS: NG tube in the lower chest, likely within the hiatal hernia. Bilateral effusions and airspace disease again noted. IMPRESSION: NG tube in the lower chest, likely within the hiatal hernia. Electronically Signed   By: Charlett Nose M.D.   On: 10/23/2022 19:41   DG Abd Portable 1V  Result Date: 10/23/2022 CLINICAL DATA:  Small-bowel obstruction EXAM: PORTABLE ABDOMEN - 1 VIEW COMPARISON:  Previous studies including the CT done on 21-Nov-2022 FINDINGS: There is abnormal  dilation of small bowel loops measuring up to 5.9 cm in diameter in left lower abdomen. There is homogeneous opacity, possibly dilated distal small bowel loop measuring up to 7.5 cm in diameter. Gas and stool are present in ascending, descending and sigmoid colon. Rectum is not included in its entirety in the image. Heart is enlarged in size. There is increased density in the lower lung fields, possibly due to pleural effusions and underlying infiltrates. IMPRESSION: Findings suggest high-grade partial small bowel obstruction. Small amount of gas and stool are present in the ascending, descending and sigmoid colon. Electronically Signed   By: Ernie Avena M.D.   On: 10/23/2022 16:56    Anti-infectives: Anti-infectives (From admission, onward)    Start     Dose/Rate Route Frequency Ordered Stop   10/23/22 2100  vancomycin (VANCOREADY) IVPB 750 mg/150 mL  Status:  Discontinued        750 mg 150 mL/hr over 60 Minutes Intravenous Every 48 hours 11/21/2022 1107 10/23/22 1600   10/19/22 2100  vancomycin (VANCOCIN) IVPB 1000 mg/200 mL premix  Status:  Discontinued        1,000 mg 200 mL/hr over 60 Minutes Intravenous Every 48 hours 10/17/22 2246 November 21, 2022 1107   10/18/22 2100  ceFEPIme (MAXIPIME) 2 g in sodium chloride 0.9 % 100 mL IVPB  Status:  Discontinued        2 g 200 mL/hr over 30 Minutes Intravenous Every 24 hours 10/17/22 2246 10/23/22 1600   10/17/22 2045  vancomycin (VANCOCIN) IVPB 1000 mg/200 mL premix        1,000 mg 200 mL/hr over 60 Minutes Intravenous  Once 10/17/22 2036 10/17/22 2251   10/17/22 2045  ceFEPIme (MAXIPIME) 2 g in sodium chloride 0.9 % 100 mL IVPB        2 g 200 mL/hr over 30 Minutes Intravenous  Once 10/17/22 2036 10/17/22 2251        Assessment/Plan  43M with complex medical and surgical history and CT evidence of SBO  SBO with NG tube now Ventral hernias soft and reducible do not appear to be the cause of the obstruction Treat SBO as adhesive with NG  decompression and monitoring for now.  Appears to be improving with NG, bowel movements yesterday Okay to clamp and try to remove NG tube today once he is more awake and interactive     LOS: 8 days   I reviewed hospitalist notes, last 24 h vitals and pain scores, last 48 h intake and output, last 24 h labs and trends, and last 24 h imaging results.  Quentin Ore, MD

## 2022-10-25 NOTE — Progress Notes (Signed)
[name]  AVW:[id number] DOB: 07-17-1923 DOA: 10/17/2022 PCP: Mliss Sax, MD    Brief Narrative:  89 year old with a history of PAF on Eliquis and SSS status post pacemaker, CKD stage IV with baseline creatinine 2.3, HTN, DM2, chronic systolic CHF, and anemia of chronic kidney disease who was admitted to [facility] 10/17/2022 with cellulitis of the left foot.  He has a chronic necrotic left great toe.  Of note he had been hospitalized 5/20-5/29/2024 with cardiac tamponade requiring pericardiocentesis.  Consultants:  General Surgery Palliative Care Cardiology Podiatry  Goals of Care:  Code Status: DNR   DVT prophylaxis: IV heparin  Interim Hx: Afebrile.  Mild tachycardia persist with heart rate up to 115.  Vitals otherwise stable.  No acute events recorded overnight.  CBG monitor was reading 45 at the time of visit, but the patient was alert and conversant at the time.  He did not appear to be uncomfortable.  He reportedly had a bowel movement earlier this morning but it was reportedly very small.   Assessment & Plan:  Cellulitis left foot -chronic left hallux ischemia- PAD Followed in the outpatient setting by Dr. Kirke Corin with concern for popliteal and TP trunk occlusion - plain films reveal fracture at the base of the proximal phalanx of the left great toe but no evidence of osteomyelitis -empiric antibiotic course has been completed - Podiatry evaluated and felt the patient was not a candidate for surgery - Cardiology evaluated his PAD and recommended comfort focused care with medical management only - Palliative Care has met with the patient's family, and hospice will be arranged for home with the patient to receive ongoing medical care until such time that he is able to be discharged  Recurrent SBO Acutely developed 6/3 - NG initially used but patient removed on his own, then due to symptoms it had to be replaced the evening of 6/5 - CT abdomen suggested  incarcerated or strangulated hernia but Surgery did not feel physical exam was consistent with this - General Surgery continues to follow and direct treatment of this issue -clinically appears to be making some progress -will keep NG tube 1 more night as patient appears to be tolerating it well -if further improved on exam tomorrow we will plan to DC NG 6/8  Chronic paroxysmal atrial fibrillation on chronic Eliquis Rate reasonably well controlled - stopped IV heparin 10/24/22 due to concern for GI bleeding with NG in place with patient previously having traumatically removed an NG - holding further anticog for now as Hgb was declining - recheck CBC in AM  Chronic systolic CHF EF 45-50% via TTE 10/10/2022 - well compensated at present with no evidence of volume overload  CKD stage IV Creatinine climbing in setting of SBO and poor intake - cont IVF today   Anemia of CKD Hemoglobin was declining, leading to stopping of IV heparin - cont to monitor trend -appears it may be stabilizing at present  DM2 Continue with SSI -CBG controlled while patient is n.p.o.  BPH Continue usual tamsulosin and finasteride  Chronic hypotension Required midodrine as an outpatient  Family Communication: No family present at time of exam Disposition: For discharge home with hospice care once able to eat    Objective: Blood pressure 95/70, pulse (!) 104, temperature 98 F (36.7 C), temperature source Oral, resp. rate 16, height 5\' 11"  (1.803 m), weight 63.4 kg, SpO2 94 %.  Intake/Output Summary (Last 24 hours) at 10/25/2022 1534 Last data filed at 10/24/2022  1900 Gross per 24 hour  Intake 1007.41 ml  Output 600 ml  Net 407.41 ml   Filed Weights   10/21/22 0441 10/22/22 0310 10/23/22 0500  Weight: 63.7 kg 63.4 kg 63.4 kg    Examination: General: No acute respiratory distress Lungs: Clear to auscultation bilaterally  Cardiovascular: Regular rate without murmur  Abdomen: Non-distended, soft, bowel sounds  hypoactive, no appreciable mass, no rebound Extremities: No significant edema B LE  CBC: Recent Labs  Lab 10/24/22 1010 10/24/22 1400 10/25/22 0745  WBC 11.8* 12.0* 14.7*  NEUTROABS  --  9.6*  --   HGB 10.3* 9.8* 10.9*  HCT 31.4* 29.1* 33.1*  MCV 75.7* 75.6* 77.5*  PLT 223 197 188   Basic Metabolic Panel: Recent Labs  Lab 10/23/22 0344 10/24/22 1010 10/24/22 1400 10/25/22 0745  NA  --  141 140 143  K  --  3.8 3.9 4.0  CL  --  106 108 106  CO2  --  22 19* 20*  GLUCOSE  --  134* 120* 86  BUN  --  54* 49* 48*  CREATININE  --  2.92* 2.96* 2.83*  CALCIUM  --  8.6* 8.3* 8.6*  MG  --  2.2 2.2 2.2  PHOS 3.9 3.9  --  3.5   GFR: Estimated Creatinine Clearance: 13.7 mL/min (A) (by C-G formula based on SCr of 2.83 mg/dL (H)).   Scheduled Meds:  fentaNYL  1 patch Transdermal Q72H   fluticasone  1 spray Each Nare Daily   metoprolol tartrate  2.5 mg Intravenous Q6H   pantoprazole (PROTONIX) IV  40 mg Intravenous Q24H   Continuous Infusions:  dextrose 5% lactated ringers 100 mL/hr at 10/25/22 1201   valproate sodium 125 mg (10/25/22 1031)     LOS: 8 days   Lonia Blood, MD [facility] Office  [phone] Pager - Text Page per Loretha Stapler  If 7PM-7AM, please contact night-coverage per Amion 10/25/2022, 3:34 PM

## 2022-10-25 NOTE — Progress Notes (Signed)
Civil engineer, contracting [facility]) Hospital Liaison Note  Referral received for patient/family interest in hospice at home. [facility] left voicemail with patient's granddaughter to confirm interest.   Plan is to discharge when medically ready.   No current DME needs.   Please send comfort medications/prescriptions home with patient at discharge.   Please call with any questions or concerns. Thank you  Dionicio Stall, [facility] Liaison [phone]

## 2022-10-25 NOTE — Inpatient Diabetes Management (Signed)
Inpatient Diabetes Program Recommendations  AACE/ADA: New Consensus Statement on Inpatient Glycemic Control (2015)  Target Ranges:  Prepandial:   less than 140 mg/dL      Peak postprandial:   less than 180 mg/dL (1-2 hours)      Critically ill patients:  140 - 180 mg/dL   Lab Results  Component Value Date   GLUCAP 82 10/25/2022   HGBA1C 7.2 (H) 10/09/2022    Review of Glycemic Control  Latest Reference Range & Units 10/24/22 07:53 10/24/22 11:18 10/24/22 15:21 10/24/22 19:28 10/24/22 20:19 10/24/22 23:39 10/25/22 03:24 10/25/22 07:56  Glucose-Capillary 70 - 99 mg/dL 098 (H) 119 (H)  Novolog 2 units 93 48 (L) 82 113 (H) 125 (H) 82  (H): Data is abnormally high (L): Data is abnormally low  Diabetes history: DM2 Outpatient Diabetes medications: None Current orders for Inpatient glycemic control: Novolog 0-9 units Q4H  Inpatient Diabetes Program Recommendations:    If appropriate, please consider:  Novolog 0-6 units Q4H.   Will continue to follow while inpatient.  Thank you, Dulce Sellar, MSN, CDCES Diabetes Coordinator Inpatient Diabetes Program [phone] (team pager from 8a-5p)

## 2022-10-25 NOTE — Progress Notes (Signed)
Daily Progress Note   Patient Name: [name]       Date: 10/25/2022 DOB: August 30, 1923  Age: 89 y.o. MRN#: [id number] Attending Physician: Lonia Blood, MD Primary Care Physician: Mliss Sax, MD Admit Date: 10/17/2022  Reason for Consultation/Follow-up: Establishing goals of care  Subjective: I have reviewed medical records including EPIC notes, MAR, and labs. Surgery notes indicate pSBO seems to be improving and is okay with clamping trial today/removal of NGT if successful. Received report from primary RN - no acute concerns. RN reports patient remains confused, but no longer pulling at NGT and mitts removed. RN states patient has denied pain this morning and had a very small BM.  Went to visit patient at bedside - caregiver/Donna present. Patient was lying in bed intermittently awake/asleep during my visit. No signs or non-verbal gestures of pain or discomfort noted. No respiratory distress, increased work of breathing, or secretions noted.   Lupita Leash confirms patient has 24/7 care at home. Therapeutic listening provided as she reflects on her time working with and getting to know the patient as his caregiver. She asks appropriate questions on how to care for patient after discharge -  specifically wondering how to administer his medications and if he will be able to eat/drink. We discussed this would be dependent on how his recovery goes from the pSBO - provided updates it seems to be improving today. Reviewed goal is for him to be able to eat/drink for comfort; however, if he is unable to tolerate anything orally, reviewed alternate routes of medication administration. Lupita Leash expresses understanding and expresses appreciation. She wants comfort to be his priority.  10:57  AM Attempted to call granddaughter/HCPOA/Keturah to discuss updates, GOC, EOL wishes, disposition, and options - no answer - confidential voicemail left and PMT phone number provided with request to return call.    Length of Stay: 8  Current Medications: Scheduled Meds:   fentaNYL  1 patch Transdermal Q72H   fluticasone  1 spray Each Nare Daily   insulin aspart  0-9 Units Subcutaneous Q4H   metoprolol tartrate  2.5 mg Intravenous Q6H   pantoprazole (PROTONIX) IV  40 mg Intravenous Q24H    Continuous Infusions:  dextrose 5% lactated ringers 100 mL/hr at 10/25/22 0847   valproate sodium 125 mg (10/25/22 1031)    PRN Meds:  acetaminophen **OR** acetaminophen, cetaphil, diphenhydrAMINE-zinc acetate, HYDROmorphone (DILAUDID) injection, naLOXone (NARCAN)  injection, ondansetron (ZOFRAN) IV  Physical Exam Vitals and nursing note reviewed.  Constitutional:      General: He is not in acute distress.    Appearance: He is ill-appearing.  Pulmonary:     Effort: No respiratory distress.  Skin:    General: Skin is warm and dry.  Neurological:     Motor: Weakness present.             Vital Signs: BP 103/77 (BP Location: Right Arm)   Pulse (!) 109   Temp 97.8 F (36.6 C) (Oral)   Resp (!) 23   Ht 5\' 11"  (1.803 m)   Wt 63.4 kg   SpO2 99%   BMI 19.49 kg/m  SpO2: SpO2: 99 % O2 Device: O2 Device: Room Air O2 Flow Rate: O2 Flow Rate (L/min): 2 L/min  Intake/output summary:  Intake/Output Summary (Last 24 hours) at 10/25/2022 1032 Last data filed at 10/24/2022 1900 Gross per 24 hour  Intake 1007.41 ml  Output 600 ml  Net 407.41 ml   LBM: Last BM Date : 10/21/22 Baseline Weight: Weight: 62 kg Most recent weight: Weight: 63.4 kg       Palliative Assessment/Data: PPS 10% - NPO status due to pSBO with NGT       Patient Active Problem List   Diagnosis Date Noted   Chronic systolic CHF (congestive heart failure) ([name]) 10/18/2022   Cellulitis of left lower extremity 10/17/2022    Pericardial effusion 10/08/2022   CHF (congestive heart failure) ([name]) 10/07/2022   Gangrene of toe of left foot ([name]) 10/07/2022   LVH (left ventricular hypertrophy) 09/11/2022   Coronary atherosclerosis 08/15/2022   Encounter for fitting and adjustment of hearing aid 08/15/2022   Hearing loss 08/15/2022   Hypertensive heart disease with heart failure ([name]) 08/15/2022   Muscle weakness (generalized) 08/15/2022   Need for assistance with personal care 08/15/2022   Encounter for issue of other medical certificate 16/02/9603   Encounter for other administrative examinations 08/15/2022   Other specified hearing loss, bilateral 08/15/2022   Pain in joint, lower leg 08/15/2022   Problem related to unspecified psychosocial circumstances 08/15/2022   Cellulitis and abscess of toe of left foot 08/08/2022   Left leg cellulitis 08/08/2022   Bilateral pleural effusion 07/22/2022   DNR (do not resuscitate)/DNI(Do Not Intubate) 07/22/2022   Edema due to hypoalbuminemia 07/22/2022   Chronic hypoxic respiratory failure, on home oxygen therapy ([name]) - 4 L/min 07/22/2022   Pressure injury of skin 07/10/2022   Essential hypertension 07/06/2022   Alzheimer disease ([name]) 07/06/2022   Pleural effusion 07/06/2022   History of CVA (cerebrovascular accident) 07/06/2022   Ventral hernia 07/06/2022   Ulcer of lower extremity, limited to breakdown of skin ([name]) 05/30/2022   Urinary frequency 02/14/2022   Hypotension due to drugs 02/07/2022   Need for influenza vaccination 02/07/2022   History of anemia due to chronic kidney disease 12/31/2021   Agitation due to dementia ([name]) 12/25/2021   BPH (benign prostatic hyperplasia) 12/05/2021   Lower extremity edema 12/05/2021   Scrotum swelling 12/05/2021   Elevated homocysteine 11/30/2021   Low magnesium level 11/30/2021   Balanitis 11/30/2021   Gastroesophageal reflux disease 10/22/2021   Urinary tract infection 10/22/2021   Chronic atrial fibrillation  with RVR ([name]) 07/30/2021   Hypophosphatemia 07/29/2021   Stage 3b chronic kidney disease ([name]) 04/25/2021   Continuous leakage of urine 04/25/2021   At high risk for  injury related to fall 04/25/2021   Hypocalcemia 10/30/2020   Vitamin D deficiency 10/30/2020   Edema due to malnutrition ([name]) 10/30/2020   Unsteady gait when walking 09/21/2020   Sarcopenia 09/21/2020   B12 deficiency 01/27/2020   Acute on chronic diastolic CHF (congestive heart failure) ([name]) 03/09/2019   Pain in both feet 11/17/2018   Neuropathy 10/09/2018   Insomnia 10/09/2018   Slow transit constipation 06/18/2018   Iron deficiency anemia 11/25/2017   Decreased appetite 10/16/2017   Hearing difficulty of both ears 10/16/2017   Allergic rhinitis due to pollen 09/03/2017   Asymmetric SNHL (sensorineural hearing loss) 08/14/2017   Tinnitus aurium, left 08/14/2017   Ventricular tachycardia ([name]) 01/14/2017   Malnutrition of moderate degree 01/13/2017   Exertional dyspnea 01/12/2017   Hypertension    PVD (peripheral vascular disease) ([name]) 08/14/2016   CVA (cerebral infarction) 07/23/2015   Microcytic anemia 07/23/2015   Stroke (cerebrum) ([name]) 07/23/2015   CKD (chronic kidney disease) stage 4, GFR 15-29 ml/min ([name]) 07/23/2015   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 03/15/2015   Primary open-angle glaucoma, left eye, indeterminate stage 03/15/2015   PCO (posterior capsule opacification) 11/29/2014   Ptosis of eyelid 07/27/2012   POAG (primary open-angle glaucoma) 11/27/2011   Trichiasis 11/27/2011   PPM-[employer]tr[employer] 03/13/2010   DM2 (diabetes mellitus, type 2) ([name]) 11/14/2008   Paroxysmal atrial fibrillation ([name]) 11/14/2008   BRADYCARDIA-TACHYCARDIA SYNDROME s/p PPM 11/14/2008    Palliative Care Assessment & Plan   Patient Profile: 89 y.o. male with past medical history of peripheral artery disease, CHF, dementia, afib, tachybrady syndrome s/p pacmaker, CKD IV, recently admitted 5/20-5/28 with volume overload  resulting in pericardial infusion (cardiocentesis with 1033 cc off), L toe gangrene, discharged home on 5/28 and readmitted on 10/17/2022 with L lower extremity pain. Workup reveals cellulitis and critical limb ischemia. Recommendations per vascular, podiatry and cardiology have been comfort focused. He is being treated with antibiotics and MRI is pending. Palliative consulted for further goals of care.   Assessment: Principal Problem:   Cellulitis of left lower extremity Active Problems:   DM2 (diabetes mellitus, type 2) ([name])   Paroxysmal atrial fibrillation ([name])   CKD (chronic kidney disease) stage 4, GFR 15-29 ml/min ([name])   BPH (benign prostatic hyperplasia)   History of anemia due to chronic kidney disease   Chronic systolic CHF (congestive heart failure) ([name])   Concern about end of life  Recommendations/Plan: Again unable to speak with HCPOA/granddaughter today Continue current plan of care Goal is for discharge home with hospice after treating pSBO Continue DNR/DNI as previously documented  PMT will continue to follow and support holistically  Goals of Care and Additional Recommendations: Limitations on Scope of Treatment: Avoid Hospitalization and Full Scope Treatment  Code Status:    Code Status Orders  (From admission, onward)           Start     Ordered   10/17/22 2116  Do not attempt resuscitation (DNR)  Continuous       Question Answer Comment  If patient has no pulse and is not breathing Do Not Attempt Resuscitation   If patient has a pulse and/or is breathing: Medical Treatment Goals LIMITED ADDITIONAL INTERVENTIONS: Use medication/IV fluids and cardiac monitoring as indicated; Do not use intubation or mechanical ventilation (DNI), also provide comfort medications.  Transfer to Progressive/Stepdown as indicated, avoid Intensive Care.   Consent: Discussion documented in EHR or advanced directives reviewed      10/17/22 2116  Code Status History      Date Active Date Inactive Code Status Order ID Comments User Context   10/07/2022 1237 10/16/2022 1630 DNR [id number]  Clydie Braun, MD ED   08/08/2022 0019 08/09/2022 1636 DNR [id number]  Rometta Emery, MD ED   07/22/2022 2043 07/29/2022 1803 DNR [id number]  Carollee Herter, DO ED   07/22/2022 1955 07/22/2022 2043 DNR [id number]  Carollee Herter, DO ED   07/08/2022 1145 07/10/2022 1739 DNR [id number]  Dimple Nanas, MD Inpatient   07/06/2022 1629 07/08/2022 1145 Full Code [id number]  Orland Mustard, MD ED   07/06/2022 1614 07/06/2022 1629 Full Code [id number]  Orland Mustard, MD ED   02/04/2022 1444 02/04/2022 2142 Full Code [id number]  Marinus Maw, MD Inpatient   12/31/2021 1636 01/01/2022 1950 Full Code [id number]  Joseph Art, DO ED   07/29/2021 1252 08/01/2021 2233 Full Code [id number]  Bobette Mo, MD ED   12/01/2017 1750 12/05/2017 1725 Full Code [id number]  Lenox Ponds, MD ED   01/12/2017 1000 01/17/2017 1356 Full Code [id number]  Alwyn Ren, MD Inpatient   08/14/2016 1733 08/15/2016 1641 Full Code [id number]  Iran Ouch, MD Inpatient   08/07/2016 1333 08/07/2016 2233 Full Code [id number]  Iran Ouch, MD Inpatient   07/23/2015 1610 07/25/2015 1803 Full Code [id number]  Gwenyth Bender, NP ED   08/17/2013 2011 08/19/2013 1744 Full Code [id number]  Henderson Cloud, MD Inpatient   02/15/2012 0025 02/18/2012 2115 Full Code [id number]  Ron Parker, MD Inpatient      Advance Directive Documentation    Flowsheet Row Most Recent Value  Type of Advance Directive Out of facility DNR (pink MOST or yellow form)  Pre-existing out of facility DNR order (yellow form or pink MOST form) --  "MOST" Form in Place? --       Prognosis:  < 6 months  Discharge Planning: Home with Hospice  Care plan was discussed with primary RN, patient's caregiver  Thank you for allowing the Palliative Medicine Team to assist in the care of this patient.   Haskel Khan, NP  Please  contact Palliative Medicine Team phone at [phone] for questions and concerns.   *Portions of this note are a verbal dictation therefore any spelling and/or grammatical errors are due to the "Dragon Medical One" system interpretation.

## 2022-10-25 NOTE — TOC Progression Note (Signed)
Transition of Care (TOC) - Progression Note  Donn Pierini RN,BSN Transitions of Care Unit 4NP (Non Trauma)- RN Case Manager See Treatment Team for direct Phone #   Patient Details  Name: [name] MRN: [id number] Date of Birth: [birth year]  Transition of Care [facility]) CM/SW Contact  Zenda Alpers, Lenn Sink, RN Phone Number: 10/25/2022, 3:14 PM  Clinical Narrative:    Noted PC note with plan to return home with Hospice once pSBO treated. Pt is already active with [facility]- liaison Shanita updated on plan for Hospice at home on transition at discharge.  [facility] to follow for Home Hospice needs.    Expected Discharge Plan: Home w Hospice Care Barriers to Discharge: Continued Medical Work up  Expected Discharge Plan and Services                            Home w/ Hospice.                    Social Determinants of Health (SDOH) Interventions SDOH Screenings   Food Insecurity: No Food Insecurity (10/09/2022)  Housing: Low Risk  (10/09/2022)  Transportation Needs: No Transportation Needs (10/09/2022)  Utilities: Not At Risk (10/09/2022)  Alcohol Screen: Low Risk  (08/19/2022)  Depression (PHQ2-9): Low Risk  (09/11/2022)  Financial Resource Strain: Low Risk  (08/19/2022)  Physical Activity: Sufficiently Active (08/19/2022)  Social Connections: Moderately Isolated (08/19/2022)  Stress: No Stress Concern Present (08/19/2022)  Tobacco Use: Medium Risk (10/18/2022)    Readmission Risk Interventions    10/13/2022    4:40 PM 08/08/2022    2:30 PM 07/08/2022   11:12 AM  Readmission Risk Prevention Plan  Transportation Screening Complete Complete Complete  Medication Review Oceanographer) Complete Complete Complete  PCP or Specialist appointment within 3-5 days of discharge Complete Complete Complete  HRI or Home Care Consult Complete Complete Complete  SW Recovery Care/Counseling Consult Complete Complete Complete  Palliative Care Screening Not Applicable Complete  Not Applicable  Skilled Nursing Facility Not Applicable Not Applicable Not Applicable

## 2022-10-26 DIAGNOSIS — L03116 Cellulitis of left lower limb: Secondary | ICD-10-CM | POA: Diagnosis not present

## 2022-10-26 DIAGNOSIS — K56609 Unspecified intestinal obstruction, unspecified as to partial versus complete obstruction: Secondary | ICD-10-CM | POA: Diagnosis not present

## 2022-10-26 DIAGNOSIS — M79672 Pain in left foot: Secondary | ICD-10-CM | POA: Diagnosis not present

## 2022-10-26 DIAGNOSIS — R638 Other symptoms and signs concerning food and fluid intake: Secondary | ICD-10-CM

## 2022-10-26 DIAGNOSIS — S92401A Displaced unspecified fracture of right great toe, initial encounter for closed fracture: Secondary | ICD-10-CM | POA: Diagnosis not present

## 2022-10-26 LAB — GLUCOSE, CAPILLARY
Glucose-Capillary: 125 mg/dL — ABNORMAL HIGH (ref 70–99)
Glucose-Capillary: 118 mg/dL — ABNORMAL HIGH (ref 70–99)
Glucose-Capillary: 121 mg/dL — ABNORMAL HIGH (ref 70–99)
Glucose-Capillary: 92 mg/dL (ref 70–99)

## 2022-10-26 MED ORDER — GLYCOPYRROLATE 0.2 MG/ML IJ SOLN
0.2000 mg | INTRAMUSCULAR | Status: DC | PRN
  Administered 2022-10-26: 0.2 mg via INTRAVENOUS
  Filled 2022-10-26: qty 1

## 2022-10-26 MED ORDER — DIPHENHYDRAMINE HCL 50 MG/ML IJ SOLN
12.5000 mg | INTRAMUSCULAR | Status: DC | PRN
  Administered 2022-10-26 – 2022-10-27 (×2): 12.5 mg via INTRAVENOUS
  Filled 2022-10-26 (×2): qty 1

## 2022-10-26 MED ORDER — LORAZEPAM 2 MG/ML PO CONC: 1.0000 mg | ORAL | Status: DC | PRN

## 2022-10-26 MED ORDER — POLYVINYL ALCOHOL 1.4 % OP SOLN: 1.0000 [drp] | Freq: Four times a day (QID) | OPHTHALMIC | Status: DC | PRN

## 2022-10-26 MED ORDER — METOPROLOL TARTRATE 25 MG/10 ML ORAL SUSPENSION
12.5000 mg | Freq: Two times a day (BID) | ORAL | Status: DC
  Administered 2022-10-26: 12.5 mg via ORAL
  Filled 2022-10-26 (×2): qty 5

## 2022-10-26 MED ORDER — BIOTENE DRY MOUTH MT LIQD
15.0000 mL | Freq: Two times a day (BID) | OROMUCOSAL | Status: DC
  Administered 2022-10-26 – 2022-10-27 (×2): 15 mL via TOPICAL

## 2022-10-26 MED ORDER — GLYCOPYRROLATE 1 MG PO TABS: 1.0000 mg | ORAL_TABLET | ORAL | Status: DC | PRN

## 2022-10-26 MED ORDER — HALOPERIDOL LACTATE 5 MG/ML IJ SOLN: 2.0000 mg | Freq: Four times a day (QID) | INTRAMUSCULAR | Status: DC | PRN

## 2022-10-26 MED ORDER — GLYCOPYRROLATE 0.2 MG/ML IJ SOLN: 0.2000 mg | INTRAMUSCULAR | Status: DC | PRN

## 2022-10-26 MED ORDER — LORAZEPAM 1 MG PO TABS: 1.0000 mg | ORAL_TABLET | ORAL | Status: DC | PRN

## 2022-10-26 MED ORDER — HALOPERIDOL 1 MG PO TABS: 2.0000 mg | ORAL_TABLET | Freq: Four times a day (QID) | ORAL | Status: DC | PRN

## 2022-10-26 MED ORDER — HALOPERIDOL LACTATE 2 MG/ML PO CONC: 2.0000 mg | Freq: Four times a day (QID) | ORAL | Status: DC | PRN

## 2022-10-26 MED ORDER — HYDROMORPHONE HCL 1 MG/ML IJ SOLN: 0.5000 mg | INTRAMUSCULAR | Status: DC | PRN

## 2022-10-26 MED ORDER — DIVALPROEX SODIUM 125 MG PO CSDR
125.0000 mg | DELAYED_RELEASE_CAPSULE | Freq: Two times a day (BID) | ORAL | Status: DC
  Administered 2022-10-26: 125 mg via ORAL
  Filled 2022-10-26: qty 1

## 2022-10-26 MED ORDER — HYDROMORPHONE HCL 1 MG/ML IJ SOLN
0.5000 mg | INTRAMUSCULAR | Status: DC | PRN
  Administered 2022-10-26 – 2022-10-27 (×3): 1 mg via INTRAVENOUS
  Filled 2022-10-26 (×3): qty 1

## 2022-10-26 NOTE — Progress Notes (Signed)
Brief Palliative Medicine Progress Note:  PMT following peripherally for needs/decline:  Medical records reviewed including progress notes, labs, imaging. PRN dilaudid administered x4 in 24 hours; fentanyl patch continued. Patient self removed NGT overnight - plan is for clear liquids today.   Will consider switching IV dilaudid to oral if his diet is advanced in preparation for discharge home with hospice.  Goals are clear for discharge home with hospice after treatment for pSBO. PMT will follow peripherally and visit with patient and family incrementally for goals of care discussions as appropriate and based on clinical course. If there are any imminent needs please call the service directly. Family also has PMT contact information should further needs arise. Contact with HCPOA has been attempted by PMT the last two days without return call.   Thank you for allowing PMT to assist in the care of this patient.  Bich Mchaney M. Katrinka Blazing [facility] Palliative Medicine Team Team Phone: [phone] NO CHARGE

## 2022-10-26 NOTE — Progress Notes (Deleted)
   Cardiology Clinic Note   Date: 10/26/2022 ID: [name], DOB Jun 21, 1923, MRN [id number]  Primary Cardiologist:  None  Patient Profile    [name] is a 89 y.o. male who presents to the clinic today for ***  Past medical history significant for: Chronic diastolic heart failure. PAF. PAD. Bradycardia-tachycardia syndrome. Pericardial effusion.  Hypertension. Hyperlipidemia. CVA. Alzheimer disease. CKD stage IV. T2DM.   History of Present Illness    [name] ***   Today, patient ***   ROS: All other systems reviewed and are otherwise negative except as noted in History of Present Illness.  Studies Reviewed    ECG personally reviewed by me today: ***  No significant changes from ***  Risk Assessment/Calculations    {Does this patient have ATRIAL FIBRILLATION?:[phone]}          Physical Exam    VS:  There were no vitals taken for this visit. , BMI There is no height or weight on file to calculate BMI.  GEN: Well nourished, well developed, in no acute distress. Neck: No JVD or carotid bruits. Cardiac: *** RRR. No murmurs. No rubs or gallops.   Respiratory:  Respirations regular and unlabored. Clear to auscultation without rales, wheezing or rhonchi. GI: Soft, nontender, nondistended. Extremities: Radials/DP/PT 2+ and equal bilaterally. No clubbing or cyanosis. No edema ***  Skin: Warm and dry, no rash. Neuro: Strength intact.  Assessment & Plan   ***  Disposition: ***     {Are you ordering a CV Procedure (e.g. stress test, cath, DCCV, TEE, etc)?   Press F2        :[id number]}   Signed, Etta Grandchild. Stevens Magwood, DNP, NP-C

## 2022-10-26 NOTE — Progress Notes (Addendum)
[name]  ZOX:[id number] DOB: 1924/01/12 DOA: 10/17/2022 PCP: Mliss Sax, MD    Brief Narrative:  89 year old with a history of PAF on Eliquis and SSS status post pacemaker, CKD stage IV with baseline creatinine 2.3, HTN, DM2, chronic systolic CHF, and anemia of chronic kidney disease who was admitted to [facility] 10/17/2022 with cellulitis of the left foot.  He has a chronic necrotic left great toe.  Of note he had been hospitalized 5/20-5/29/2024 with cardiac tamponade requiring pericardiocentesis.  Consultants:  General Surgery Palliative Care Cardiology Podiatry  Goals of Care:  Code Status: DNR   DVT prophylaxis: IV heparin  Interim Hx: The patient removed his own NG tube yesterday afternoon.  We opted to leave it out as clinically he appeared to be improving.  Afebrile.  Mild tachycardia up to 109.  Blood pressure stable.  No other acute events reported overnight. Appears to be resting comfortably at the time of my visit. Minimally responsive. Does not speak to the examiner. There was a genlteman at the bedside who reported to be a "family member" but did not provide more detail. I spoke to him in very general terms regarding the patient, and explained that he would need to speak to the [facility] to get specific details.   Assessment & Plan:  Cellulitis left foot -chronic left hallux ischemia- PAD Followed in the outpatient setting by Dr. Kirke Corin with concern for popliteal and TP trunk occlusion - plain films reveal fracture at the base of the proximal phalanx of the left great toe but no evidence of osteomyelitis -empiric antibiotic course has been completed - Podiatry evaluated and felt the patient was not a candidate for surgery - Cardiology evaluated his PAD and recommended comfort focused care with medical management only - Palliative Care has met with the patient's family, and hospice will be arranged for home with the patient to receive ongoing medical care  until such time that he is able to be discharged  Recurrent SBO Acutely developed 6/3 - NG initially used but patient removed on his own, then due to symptoms it had to be replaced the evening of 6/5, with the patient subsequently removing it on his own again 6/7 - CT abdomen suggested incarcerated or strangulated hernia but Surgery did not feel physical exam was consistent with this - clinically appears to be making some progress -doing reasonably well post DC of NG 6/7 -attempt clear liquids today  Chronic paroxysmal atrial fibrillation on chronic Eliquis Rate reasonably well controlled - stopped IV heparin 10/24/22 due to concern for GI bleeding with NG in place with patient previously having traumatically removed an NG - holding further anticog for now as Hgb was declining - resumption of anticoag at this point is not felt to offer the patient worthwhile benefit compared to increased risk of bleeding   Chronic systolic CHF EF 45-50% via TTE 10/10/2022 - well compensated at present with no evidence of volume overload  CKD stage IV Creatinine climbing in setting of SBO and poor intake - cont IVF today - advance diet as able - f/u labs in am for prognostic purposes   Anemia of CKD Hemoglobin was declining, leading to stopping of IV heparin - cont to monitor trend -appears it may be stabilizing at present  DM2 Continue with SSI - CBG controlled  BPH Continue usual tamsulosin and finasteride  Chronic hypotension Required midodrine as an outpatient -BP stable at this time without use of midodrine  Family Communication:  Disposition:  For discharge home with hospice care once able to eat - possibly Monday    Objective: Blood pressure 110/84, pulse (!) 109, temperature 98.2 F (36.8 C), temperature source Oral, resp. rate 16, height 5\' 11"  (1.803 m), weight 63.4 kg, SpO2 92 %.  Intake/Output Summary (Last 24 hours) at 10/26/2022 0840 Last data filed at 10/25/2022 1941 Gross per 24 hour   Intake 2084.88 ml  Output 300 ml  Net 1784.88 ml    Filed Weights   10/21/22 0441 10/22/22 0310 10/23/22 0500  Weight: 63.7 kg 63.4 kg 63.4 kg    Examination: General: No acute respiratory distress Lungs: Clear to auscultation bilaterally  Cardiovascular: Regular rate without murmur  Abdomen: Non-distended, soft, bowel sounds hypoactive, no rebound - hernias are soft and reducible  Extremities: No significant edema B LE  CBC: Recent Labs  Lab 10/24/22 1010 10/24/22 1400 10/25/22 0745  WBC 11.8* 12.0* 14.7*  NEUTROABS  --  9.6*  --   HGB 10.3* 9.8* 10.9*  HCT 31.4* 29.1* 33.1*  MCV 75.7* 75.6* 77.5*  PLT 223 197 188    Basic Metabolic Panel: Recent Labs  Lab 10/23/22 0344 10/24/22 1010 10/24/22 1400 10/25/22 0745 10/25/22 1520  NA  --  141 140 143  --   K  --  3.8 3.9 4.0  --   CL  --  106 108 106  --   CO2  --  22 19* 20*  --   GLUCOSE  --  134* 120* 86 147*  BUN  --  54* 49* 48*  --   CREATININE  --  2.92* 2.96* 2.83*  --   CALCIUM  --  8.6* 8.3* 8.6*  --   MG  --  2.2 2.2 2.2  --   PHOS 3.9 3.9  --  3.5  --     GFR: Estimated Creatinine Clearance: 13.7 mL/min (A) (by C-G formula based on SCr of 2.83 mg/dL (H)).   Scheduled Meds:  fentaNYL  1 patch Transdermal Q72H   fluticasone  1 spray Each Nare Daily   metoprolol tartrate  2.5 mg Intravenous Q6H   pantoprazole (PROTONIX) IV  40 mg Intravenous Q24H   Continuous Infusions:  dextrose 5% lactated ringers 100 mL/hr at 10/25/22 2340   valproate sodium 125 mg (10/25/22 2141)     LOS: 9 days   Lonia Blood, MD [facility] Office  [phone] Pager - Text Page per Loretha Stapler  If 7PM-7AM, please contact night-coverage per Amion 10/26/2022, 8:40 AM

## 2022-10-26 NOTE — Progress Notes (Signed)
Subjective: CC: Seen with RN. Patient removed NGT overnight. Orientated to self, knows he is in a hospital but thinks it is in [place]. Not orientated to year. Thinks he is visiting a family member in the hospital.   Has some mild general abdominal pain. No nausea. No vomiting reported overnight. Last bm 6/6 per notes.   Afebrile. HR low 100's (109 on last check). No systolic hypotension. No labs done today.   Objective: Vital signs in last 24 hours: Temp:  [97.6 F (36.4 C)-98.2 F (36.8 C)] 98.2 F (36.8 C) (06/08 0824) Pulse Rate:  [78-113] 109 (06/08 0824) Resp:  [16-18] 16 (06/08 0824) BP: (95-123)/(70-88) 110/84 (06/08 0824) SpO2:  [92 %-98 %] 92 % (06/08 0310) Last BM Date : 10/24/22  Intake/Output from previous day: 06/07 0701 - 06/08 0700 In: 2084.9 [I.V.:1982.3; IV Piggyback:102.6] Out: 300 [Urine:300] Intake/Output this shift: Total I/O In: 1253.6 [I.V.:1202.3; IV Piggyback:51.3] Out: -   PE: Gen:  Alert, NAD, pleasant Abd: Soft, mild distension, NT, no rigidity or guarding, multiple small ventral hernias that are soft and temporarily reduce before recurring , +BS. previous laparotomy and R subcostal incision noted.   Lab Results:  Recent Labs    10/24/22 1400 10/25/22 0745  WBC 12.0* 14.7*  HGB 9.8* 10.9*  HCT 29.1* 33.1*  PLT 197 188   BMET Recent Labs    10/24/22 1400 10/25/22 0745 10/25/22 1520  NA 140 143  --   K 3.9 4.0  --   CL 108 106  --   CO2 19* 20*  --   GLUCOSE 120* 86 147*  BUN 49* 48*  --   CREATININE 2.96* 2.83*  --   CALCIUM 8.3* 8.6*  --    PT/INR No results for input(s): "LABPROT", "INR" in the last 72 hours. CMP     Component Value Date/Time   NA 143 10/25/2022 0745   NA 144 01/29/2022 1156   K 4.0 10/25/2022 0745   CL 106 10/25/2022 0745   CO2 20 (L) 10/25/2022 0745   GLUCOSE 147 (H) 10/25/2022 1520   BUN 48 (H) 10/25/2022 0745   BUN 28 01/29/2022 1156   CREATININE 2.83 (H) 10/25/2022 0745    CREATININE 1.50 (H) 08/03/2021 1658   CALCIUM 8.6 (L) 10/25/2022 0745   PROT 6.2 (L) 10/24/2022 1010   ALBUMIN 2.6 (L) 10/24/2022 1010   AST 20 10/24/2022 1010   ALT 10 10/24/2022 1010   ALKPHOS 62 10/24/2022 1010   BILITOT 0.6 10/24/2022 1010   GFRNONAA 20 (L) 10/25/2022 0745   GFRAA 39 (L) 12/05/2017 0752   Lipase     Component Value Date/Time   LIPASE 33 07/29/2021 1141    Studies/Results: DG Abd Portable 1V  Result Date: 10/25/2022 CLINICAL DATA:  Small bowel obstruction. EXAM: PORTABLE ABDOMEN - 1 VIEW COMPARISON:  October 24, 2022. FINDINGS: Stable small bowel dilatation is noted. No definite colonic dilatation is noted. Surgical clips are noted in left upper quadrant. Contrast is noted in nondilated right colon. IMPRESSION: Stable small bowel dilatation is noted. Electronically Signed   By: Lupita Raider M.D.   On: 10/25/2022 10:03   DG Abd Portable 1V  Result Date: 10/24/2022 CLINICAL DATA:  small bowel obstruction. EXAM: PORTABLE ABDOMEN - 1 VIEW COMPARISON:  10/23/2022 FINDINGS: Gaseous small bowel distension persists with small bowel loops in the left abdomen measuring up to 4.2 cm diameter. Probable fluid-filled dilated small bowel left abdomen. There does appear to be  some contrast material in the distal colon and rectum. IMPRESSION: Persistent gaseous small bowel distension with some contrast material in the distal colon and rectum. Electronically Signed   By: Kennith Center M.D.   On: 10/24/2022 13:47   DG CHEST PORT 1 VIEW  Result Date: 10/24/2022 CLINICAL DATA:  Small bowel obstruction. EXAM: PORTABLE CHEST 1 VIEW COMPARISON:  Oct 08, 2022. FINDINGS: Stable cardiomegaly. Nasogastric tube tip is seen in expected position of stomach. Bilateral pleural effusions are noted with adjacent subsegmental atelectasis or edema. Left-sided pacemaker is in good position. Bony thorax is unremarkable. IMPRESSION: Stable bibasilar opacities and pleural effusions. Electronically Signed   By:  Lupita Raider M.D.   On: 10/24/2022 13:42    Anti-infectives: Anti-infectives (From admission, onward)    Start     Dose/Rate Route Frequency Ordered Stop   10/23/22 2100  vancomycin (VANCOREADY) IVPB 750 mg/150 mL  Status:  Discontinued        750 mg 150 mL/hr over 60 Minutes Intravenous Every 48 hours 10/22/22 1107 10/23/22 1600   10/19/22 2100  vancomycin (VANCOCIN) IVPB 1000 mg/200 mL premix  Status:  Discontinued        1,000 mg 200 mL/hr over 60 Minutes Intravenous Every 48 hours 10/17/22 2246 10/22/22 1107   10/18/22 2100  ceFEPIme (MAXIPIME) 2 g in sodium chloride 0.9 % 100 mL IVPB  Status:  Discontinued        2 g 200 mL/hr over 30 Minutes Intravenous Every 24 hours 10/17/22 2246 10/23/22 1600   10/17/22 2045  vancomycin (VANCOCIN) IVPB 1000 mg/200 mL premix        1,000 mg 200 mL/hr over 60 Minutes Intravenous  Once 10/17/22 2036 10/17/22 2251   10/17/22 2045  ceFEPIme (MAXIPIME) 2 g in sodium chloride 0.9 % 100 mL IVPB        2 g 200 mL/hr over 30 Minutes Intravenous  Once 10/17/22 2036 10/17/22 2251        Assessment/Plan 24M with complex medical and surgical history and CT evidence of SBO  Ventral hernias soft and reducible do not appear to be the cause of the obstruction Patient w/ bowel function 6/6. NGT out overnight. Exam reassuring as above. Will allow sips of clears and see how he does.  Palliative following with goal for d/c home with hospital after pSBO resolves.    LOS: 9 days    Jacinto Halim , [facility] 10/26/2022, 9:05 AM Please see Amion for pager number during day hours 7:00am-4:30pm

## 2022-10-26 NOTE — Progress Notes (Signed)
Daily Progress Note   Patient Name: [name]       Date: 10/26/2022 DOB: 1923/07/13  Age: 89 y.o. MRN#: [id number] Attending Physician: Lonia Blood, MD Primary Care Physician: Mliss Sax, MD Admit Date: 10/17/2022  Reason for Consultation/Follow-up: Establishing goals of care  Subjective:  5:00 PM Notified by primary RN that family are at beside requesting to speak with PMT - want to discuss how to make him more comfortable and option of residential hospice placement.   5:15 -6:00 PM I have reviewed medical records including EPIC notes, MAR, and labs. Received report from primary RN - no acute concerns. RN reports patient has had minimal intake today; continues to complain of abdominal pain.  Went to patient's bedside - granddaughter/HCPOA/Keturah and her husband/Marlon present. Patient was lying in bed asleep - I did not attempt to wake him to preserve comfort. No signs or non-verbal gestures of pain or discomfort noted. No respiratory distress, increased work of breathing, or secretions noted.   Moved to 4NP conference room. Family are understandably tearful - therapeutic listening provided as they reflect on his decline. They have noted his decline and understand he approaching EOL. They indicate that patient has continued complaints of abdominal pain and wonder if there is a way to focus more on his comfort while in the hospital.   We talked about transition to comfort measures in house and what that would entail inclusive of medications to control pain, dyspnea, agitation, nausea, and itching. We discussed stopping all unnecessary measures such as blood draws, needle sticks, oxygen, antibiotics, CBGs/insulin, cardiac monitoring, IVF, and frequent vital signs.  Education provided that other non-pharmacological interventions would be utilized for holistic support and comfort such as spiritual support if requested, repositioning, music therapy, offering comfort feeds, and/or therapeutic listening. All care would focus on how the patient is looking and feeling. Family are tearful, but opt for patient's transition to full comfort care today.   They question if his transfer to a hospice facility is an option instead of him returning home with hospice. Education provided on hospice evaluation process - explained hospice liaison would likely be in touch with them tomorrow to provide additional details for inpatient hospice. Family are most interested in [facility].   Chaplain services offered - family would appreciate a chaplain visiting with  patient for prayer - he is [employer].  All questions and concerns addressed. Encouraged to call with questions and/or concerns. PMT card provided.  Per charge RN, patient can remain on 4NP unit, does not need transfer orders with full comfort transition today.  Length of Stay: 9  Current Medications: Scheduled Meds:   divalproex  125 mg Oral BID   fentaNYL  1 patch Transdermal Q72H   metoprolol tartrate  12.5 mg Oral BID    Continuous Infusions:  dextrose 5% lactated ringers Stopped (10/26/22 1035)    PRN Meds: acetaminophen **OR** acetaminophen, cetaphil, HYDROmorphone (DILAUDID) injection, naLOXone (NARCAN)  injection, ondansetron (ZOFRAN) IV  Physical Exam Vitals and nursing note reviewed.  Constitutional:      General: He is not in acute distress.    Appearance: He is ill-appearing.  Pulmonary:     Effort: No respiratory distress.  Skin:    General: Skin is warm and dry.  Neurological:     Motor: Weakness present.             Vital Signs: BP 110/84   Pulse (!) 109   Temp 97.6 F (36.4 C) (Axillary)   Resp 16   Ht 5\' 11"  (1.803 m)   Wt 63.4 kg   SpO2 92%   BMI 19.49 kg/m  SpO2:  SpO2: 92 % O2 Device: O2 Device: Room Air O2 Flow Rate: O2 Flow Rate (L/min): 2 L/min  Intake/output summary:  Intake/Output Summary (Last 24 hours) at 10/26/2022 1706 Last data filed at 10/26/2022 1622 Gross per 24 hour  Intake 3439.6 ml  Output 300 ml  Net 3139.6 ml   LBM: Last BM Date : 10/24/22 Baseline Weight: Weight: 62 kg Most recent weight: Weight: 63.4 kg       Palliative Assessment/Data: PPS 10-20%      Patient Active Problem List   Diagnosis Date Noted   Chronic systolic CHF (congestive heart failure) ([name]) 10/18/2022   Cellulitis of left lower extremity 10/17/2022   Pericardial effusion 10/08/2022   CHF (congestive heart failure) ([name]) 10/07/2022   Gangrene of toe of left foot ([name]) 10/07/2022   LVH (left ventricular hypertrophy) 09/11/2022   Coronary atherosclerosis 08/15/2022   Encounter for fitting and adjustment of hearing aid 08/15/2022   Hearing loss 08/15/2022   Hypertensive heart disease with heart failure ([name]) 08/15/2022   Muscle weakness (generalized) 08/15/2022   Need for assistance with personal care 08/15/2022   Encounter for issue of other medical certificate 41/32/4401   Encounter for other administrative examinations 08/15/2022   Other specified hearing loss, bilateral 08/15/2022   Pain in joint, lower leg 08/15/2022   Problem related to unspecified psychosocial circumstances 08/15/2022   Cellulitis and abscess of toe of left foot 08/08/2022   Left leg cellulitis 08/08/2022   Bilateral pleural effusion 07/22/2022   DNR (do not resuscitate)/DNI(Do Not Intubate) 07/22/2022   Edema due to hypoalbuminemia 07/22/2022   Chronic hypoxic respiratory failure, on home oxygen therapy ([name]) - 4 L/min 07/22/2022   Pressure injury of skin 07/10/2022   Essential hypertension 07/06/2022   Alzheimer disease ([name]) 07/06/2022   Pleural effusion 07/06/2022   History of CVA (cerebrovascular accident) 07/06/2022   Ventral hernia 07/06/2022   Ulcer of lower  extremity, limited to breakdown of skin ([name]) 05/30/2022   Urinary frequency 02/14/2022   Hypotension due to drugs 02/07/2022   Need for influenza vaccination 02/07/2022   History of anemia due to chronic kidney disease 12/31/2021   Agitation due to dementia ([name])  12/25/2021   BPH (benign prostatic hyperplasia) 12/05/2021   Lower extremity edema 12/05/2021   Scrotum swelling 12/05/2021   Elevated homocysteine 11/30/2021   Low magnesium level 11/30/2021   Balanitis 11/30/2021   Gastroesophageal reflux disease 10/22/2021   Urinary tract infection 10/22/2021   Chronic atrial fibrillation with RVR ([name]) 07/30/2021   Hypophosphatemia 07/29/2021   Stage 3b chronic kidney disease ([name]) 04/25/2021   Continuous leakage of urine 04/25/2021   At high risk for injury related to fall 04/25/2021   Hypocalcemia 10/30/2020   Vitamin D deficiency 10/30/2020   Edema due to malnutrition ([name]) 10/30/2020   Unsteady gait when walking 09/21/2020   Sarcopenia 09/21/2020   B12 deficiency 01/27/2020   Acute on chronic diastolic CHF (congestive heart failure) ([name]) 03/09/2019   Pain in both feet 11/17/2018   Neuropathy 10/09/2018   Insomnia 10/09/2018   Slow transit constipation 06/18/2018   Iron deficiency anemia 11/25/2017   Decreased appetite 10/16/2017   Hearing difficulty of both ears 10/16/2017   Allergic rhinitis due to pollen 09/03/2017   Asymmetric SNHL (sensorineural hearing loss) 08/14/2017   Tinnitus aurium, left 08/14/2017   Ventricular tachycardia ([name]) 01/14/2017   Malnutrition of moderate degree 01/13/2017   Exertional dyspnea 01/12/2017   Hypertension    PVD (peripheral vascular disease) ([name]) 08/14/2016   CVA (cerebral infarction) 07/23/2015   Microcytic anemia 07/23/2015   Stroke (cerebrum) ([name]) 07/23/2015   CKD (chronic kidney disease) stage 4, GFR 15-29 ml/min ([name]) 07/23/2015   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 03/15/2015   Primary open-angle glaucoma, left eye, indeterminate  stage 03/15/2015   PCO (posterior capsule opacification) 11/29/2014   Ptosis of eyelid 07/27/2012   POAG (primary open-angle glaucoma) 11/27/2011   Trichiasis 11/27/2011   PPM-[employer] 03/13/2010   DM2 (diabetes mellitus, type 2) ([name]) 11/14/2008   Paroxysmal atrial fibrillation ([name]) 11/14/2008   BRADYCARDIA-TACHYCARDIA SYNDROME s/p PPM 11/14/2008    Palliative Care Assessment & Plan   Patient Profile: 89 y.o. male with past medical history of peripheral artery disease, CHF, dementia, afib, tachybrady syndrome s/p pacmaker, CKD IV, recently admitted 5/20-5/28 with volume overload resulting in pericardial infusion (cardiocentesis with 1033 cc off), L toe gangrene, discharged home on 5/28 and readmitted on 10/17/2022 with L lower extremity pain. Workup reveals cellulitis and critical limb ischemia. Recommendations per vascular, podiatry and cardiology have been comfort focused. He is being treated with antibiotics and MRI is pending. Palliative consulted for further goals of care.   Assessment: Principal Problem:   Cellulitis of left lower extremity Active Problems:   DM2 (diabetes mellitus, type 2) ([name])   Paroxysmal atrial fibrillation ([name])   CKD (chronic kidney disease) stage 4, GFR 15-29 ml/min ([name])   BPH (benign prostatic hyperplasia)   History of anemia due to chronic kidney disease   Chronic systolic CHF (congestive heart failure) ([name])   Terminal care  Recommendations/Plan: Initiated full comfort measures Continue DNR/DNI as previously documented Family interested in [place] [facility] transfer - TOC consult placed Added orders for EOL symptom management and to reflect full comfort measures, as well as discontinued orders that were not focused on comfort Unrestricted visitation orders were placed per current [place] EOL visitation policy  Nursing to provide frequent assessments and administer PRN medications as clinically necessary to ensure EOL comfort Chaplain consulted  for: prayer with patient per family request PMT will continue to follow and support holistically  Symptom Management Dilaudid PRN pain/dyspnea/increased work of breathing/RR>25 Continue fentanyl patch Tylenol PRN pain/fever Biotin twice daily Benadryl  PRN itching Robinul PRN secretions Haldol PRN agitation/delirium Ativan PRN anxiety/seizure/sleep/distress Zofran PRN nausea/vomiting Liquifilm Tears PRN dry eye   Goals of Care and Additional Recommendations: Limitations on Scope of Treatment: Full Comfort Care  Code Status:    Code Status Orders  (From admission, onward)           Start     Ordered   10/17/22 2116  Do not attempt resuscitation (DNR)  Continuous       Question Answer Comment  If patient has no pulse and is not breathing Do Not Attempt Resuscitation   If patient has a pulse and/or is breathing: Medical Treatment Goals LIMITED ADDITIONAL INTERVENTIONS: Use medication/IV fluids and cardiac monitoring as indicated; Do not use intubation or mechanical ventilation (DNI), also provide comfort medications.  Transfer to Progressive/Stepdown as indicated, avoid Intensive Care.   Consent: Discussion documented in EHR or advanced directives reviewed      10/17/22 2116           Code Status History     Date Active Date Inactive Code Status Order ID Comments User Context   10/07/2022 1237 10/16/2022 1630 DNR [id number]  Clydie Braun, MD ED   08/08/2022 0019 08/09/2022 1636 DNR [id number]  Rometta Emery, MD ED   07/22/2022 2043 07/29/2022 1803 DNR [id number]  Carollee Herter, DO ED   07/22/2022 1955 07/22/2022 2043 DNR [id number]  Carollee Herter, DO ED   07/08/2022 1145 07/10/2022 1739 DNR [id number]  Dimple Nanas, MD Inpatient   07/06/2022 1629 07/08/2022 1145 Full Code [id number]  Orland Mustard, MD ED   07/06/2022 1614 07/06/2022 1629 Full Code [id number]  Orland Mustard, MD ED   02/04/2022 1444 02/04/2022 2142 Full Code [id number]  Marinus Maw, MD Inpatient   12/31/2021 1636  01/01/2022 1950 Full Code [id number]  Joseph Art, DO ED   07/29/2021 1252 08/01/2021 2233 Full Code [id number]  Bobette Mo, MD ED   12/01/2017 1750 12/05/2017 1725 Full Code [id number]  Lenox Ponds, MD ED   01/12/2017 1000 01/17/2017 1356 Full Code [id number]  Alwyn Ren, MD Inpatient   08/14/2016 1733 08/15/2016 1641 Full Code [id number]  Iran Ouch, MD Inpatient   08/07/2016 1333 08/07/2016 2233 Full Code [id number]  Iran Ouch, MD Inpatient   07/23/2015 1610 07/25/2015 1803 Full Code [id number]  Gwenyth Bender, NP ED   08/17/2013 2011 08/19/2013 1744 Full Code [id number]  Henderson Cloud, MD Inpatient   02/15/2012 0025 02/18/2012 2115 Full Code [id number]  Ron Parker, MD Inpatient      Advance Directive Documentation    Flowsheet Row Most Recent Value  Type of Advance Directive Out of facility DNR (pink MOST or yellow form)  Pre-existing out of facility DNR order (yellow form or pink MOST form) --  "MOST" Form in Place? --       Prognosis:  < 2 weeks  Discharge Planning: Hospice facility  Care plan was discussed with primary RN, patient's family, Dr. Sharon Seller  Thank you for allowing the Palliative Medicine Team to assist in the care of this patient.   Total Time 70 minutes Prolonged Time Billed  yes       Greater than 50%  of this time was spent counseling and coordinating care related to the above assessment and plan.  Haskel Khan, NP  Please contact Palliative Medicine Team phone at [phone] for questions and concerns.   *Portions of this note are a verbal dictation therefore any  spelling and/or grammatical errors are due to the "[employer]" system interpretation.

## 2022-10-27 DIAGNOSIS — M79672 Pain in left foot: Secondary | ICD-10-CM | POA: Diagnosis not present

## 2022-10-27 DIAGNOSIS — S92401A Displaced unspecified fracture of right great toe, initial encounter for closed fracture: Secondary | ICD-10-CM | POA: Diagnosis not present

## 2022-10-27 DIAGNOSIS — L03116 Cellulitis of left lower limb: Secondary | ICD-10-CM | POA: Diagnosis not present

## 2022-10-27 DIAGNOSIS — K56609 Unspecified intestinal obstruction, unspecified as to partial versus complete obstruction: Secondary | ICD-10-CM | POA: Diagnosis not present

## 2022-10-27 LAB — GLUCOSE, CAPILLARY: Glucose-Capillary: 104 mg/dL — ABNORMAL HIGH (ref 70–99)

## 2022-10-27 MED ORDER — DIPHENHYDRAMINE HCL 50 MG/ML IJ SOLN: 12.5000 mg | INTRAMUSCULAR | 0 refills | Status: DC | PRN

## 2022-10-27 MED ORDER — LORAZEPAM 1 MG PO TABS: 1.0000 mg | ORAL_TABLET | ORAL | 0 refills | Status: DC | PRN

## 2022-10-27 MED ORDER — BIOTENE DRY MOUTH MT LIQD: 15.0000 mL | Freq: Two times a day (BID) | OROMUCOSAL | Status: DC

## 2022-10-27 MED ORDER — FENTANYL 25 MCG/HR TD PT72: 1.0000 | MEDICATED_PATCH | TRANSDERMAL | 0 refills | Status: DC

## 2022-10-27 MED ORDER — HALOPERIDOL 2 MG PO TABS: 2.0000 mg | ORAL_TABLET | Freq: Four times a day (QID) | ORAL | Status: DC | PRN

## 2022-10-27 MED ORDER — ACETAMINOPHEN 325 MG PO TABS: 650.0000 mg | ORAL_TABLET | Freq: Four times a day (QID) | ORAL | Status: DC | PRN

## 2022-10-27 MED ORDER — ACETAMINOPHEN 650 MG RE SUPP: 650.0000 mg | Freq: Four times a day (QID) | RECTAL | 0 refills | Status: DC | PRN

## 2022-10-27 MED ORDER — GLYCOPYRROLATE 0.2 MG/ML IJ SOLN: 0.2000 mg | INTRAMUSCULAR | Status: DC | PRN

## 2022-10-27 MED ORDER — CETAPHIL MOISTURIZING EX LOTN: TOPICAL_LOTION | CUTANEOUS | 0 refills | Status: DC | PRN

## 2022-10-27 MED ORDER — HALOPERIDOL LACTATE 5 MG/ML IJ SOLN: 2.0000 mg | Freq: Four times a day (QID) | INTRAMUSCULAR | Status: DC | PRN

## 2022-10-27 MED ORDER — HALOPERIDOL LACTATE 2 MG/ML PO CONC: 2.0000 mg | Freq: Four times a day (QID) | ORAL | 0 refills | Status: DC | PRN

## 2022-10-27 MED ORDER — HYDROMORPHONE HCL 1 MG/ML IJ SOLN: 0.5000 mg | INTRAMUSCULAR | 0 refills | Status: DC | PRN

## 2022-10-27 MED ORDER — ONDANSETRON HCL 4 MG/2ML IJ SOLN: 4.0000 mg | Freq: Four times a day (QID) | INTRAMUSCULAR | 0 refills | Status: DC | PRN

## 2022-10-27 MED ORDER — GLYCOPYRROLATE 1 MG PO TABS: 1.0000 mg | ORAL_TABLET | ORAL | Status: DC | PRN

## 2022-10-27 MED ORDER — LORAZEPAM 2 MG/ML PO CONC: 1.0000 mg | ORAL | 0 refills | Status: DC | PRN

## 2022-10-27 MED ORDER — POLYVINYL ALCOHOL 1.4 % OP SOLN: 1.0000 [drp] | Freq: Four times a day (QID) | OPHTHALMIC | 0 refills | Status: DC | PRN

## 2022-10-27 NOTE — Discharge Summary (Signed)
DISCHARGE SUMMARY  [name]  MR#: [id number]  DOB:March 18, 1924  Date of Admission: 10/17/2022 Date of Discharge: 10/27/2022  Attending Physician:Ailen Strauch Silvestre Gunner, MD  Patient's WUJ:[id number], Talmadge Coventry, MD  Consults: General Surgery Palliative Care Cardiology Podiatry  Disposition: D/C to [facility]   Follow-up Appts:  Follow-up Information     [facility] Follow up.   Specialty: Hospice and Palliative Medicine Why: Hospice Contact information: [address] [place] [address] [phone]               Discharge Diagnoses: Comfort Focused Care at End of Life DNR/NCB Cellulitis left foot -chronic left hallux ischemia- PAD Recurrent SBO Chronic paroxysmal atrial fibrillation on chronic Eliquis Chronic systolic CHF CKD stage IV Anemia of CKD DM2 BPH Chronic hypotension  Initial presentation: 89 year old with a history of PAF on Eliquis and SSS status post pacemaker, CKD stage IV with baseline creatinine 2.3, HTN, DM2, chronic systolic CHF, and anemia of chronic kidney disease who was admitted to [facility] 10/17/2022 with cellulitis of the left foot. He has a chronic necrotic left great toe. Of note he had been hospitalized 5/20-5/29/2024 with cardiac tamponade requiring pericardiocentesis.   Hospital Course:  Goals of Care - Comfort Focused Care at End of Life Palliative Care met with the patient's family, and the initial plan was for hospice to be arranged for home with the patient to receive ongoing medical care until such time that he was able to be discharged - with his continued decline, however, the decision has been made to transition to comfort focused care at this time - Hospice consulted for [name] admission - the medical team fully supports this approach and feels that this is unquestionably the right decision for this gentleman.   Cellulitis left foot -chronic left hallux ischemia- PAD Followed in  the outpatient setting by Dr. Kirke Corin with concern for popliteal and TP trunk occlusion - plain films revealed fracture at the base of the proximal phalanx of the left great toe but no evidence of osteomyelitis - empiric antibiotic course was completed - Podiatry evaluated and felt the patient was not a candidate for surgery - Cardiology evaluated his PAD and recommended comfort focused care with medical management only   Recurrent SBO Acutely developed 6/3 - NG initially used but patient removed on his own, then due to symptoms it had to be replaced the evening of 6/5, with the patient subsequently removing it on his own again 6/7 - CT abdomen suggested incarcerated or strangulated hernia but Surgery did not feel physical exam was consistent with this - clinical course was waxing and waning, but intake has become signif diminished, and intermittent abdom pain continues to be a challenge   Chronic paroxysmal atrial fibrillation on chronic Eliquis stopped IV heparin 10/24/22 due to concern for GI bleeding with NG in place with patient previously having traumatically removed an NG - resumption of anticoag at this point not appropriate with transition to comfort focused care    Chronic systolic CHF EF 45-50% via TTE 10/10/2022   CKD stage IV Creatinine has been climbing in setting of SBO and poor intake   Anemia of CKD Hemoglobin was declining, leading to stopping of IV heparin   DM2 No indication for further CBG testing    BPH   Chronic hypotension    Allergies as of 10/27/2022   No Known Allergies      Medication List     STOP taking these medications  apixaban 2.5 MG Tabs tablet Commonly known as: ELIQUIS   divalproex 125 MG capsule Commonly known as: DEPAKOTE SPRINKLE   docusate sodium 100 MG capsule Commonly known as: COLACE   finasteride 5 MG tablet Commonly known as: PROSCAR   furosemide 20 MG tablet Commonly known as: LASIX   Iron (Ferrous Sulfate) 325 (65 Fe) MG  Tabs   latanoprost 0.005 % ophthalmic solution Commonly known as: XALATAN   loratadine 10 MG tablet Commonly known as: CLARITIN   Melatonin 3 MG Tbdp   memantine 10 MG tablet Commonly known as: NAMENDA   metoprolol tartrate 25 MG tablet Commonly known as: LOPRESSOR   midodrine 5 MG tablet Commonly known as: PROAMATINE   pantoprazole 40 MG tablet Commonly known as: PROTONIX   tamsulosin 0.4 MG Caps capsule Commonly known as: FLOMAX       TAKE these medications    acetaminophen 325 MG tablet Commonly known as: TYLENOL Take 2 tablets (650 mg total) by mouth every 6 (six) hours as needed for mild pain (or Fever >/= 101).   acetaminophen 650 MG suppository Commonly known as: TYLENOL Place 1 suppository (650 mg total) rectally every 6 (six) hours as needed for mild pain (or Fever >/= 101).   antiseptic oral rinse Liqd Apply 15 mLs topically 2 (two) times daily.   cetaphil lotion Apply topically as needed for dry skin.   diphenhydrAMINE 50 MG/ML injection Commonly known as: BENADRYL Inject 0.25 mLs (12.5 mg total) into the vein every 4 (four) hours as needed for itching.   fentaNYL 25 MCG/HR Commonly known as: DURAGESIC Place 1 patch onto the skin every 3 (three) days. Start taking on: October 30, 2022   glycopyrrolate 1 MG tablet Commonly known as: ROBINUL Take 1 tablet (1 mg total) by mouth every 4 (four) hours as needed (excessive secretions).   glycopyrrolate 0.2 MG/ML injection Commonly known as: ROBINUL Inject 1 mL (0.2 mg total) into the skin every 4 (four) hours as needed (excessive secretions).   glycopyrrolate 0.2 MG/ML injection Commonly known as: ROBINUL Inject 1 mL (0.2 mg total) into the vein every 4 (four) hours as needed (excessive secretions).   haloperidol 2 MG tablet Commonly known as: HALDOL Take 1 tablet (2 mg total) by mouth every 6 (six) hours as needed for agitation (or delirium).   haloperidol 2 MG/ML solution Commonly known as:  HALDOL Place 1 mL (2 mg total) under the tongue every 6 (six) hours as needed for agitation (or delirium).   haloperidol lactate 5 MG/ML injection Commonly known as: HALDOL Inject 0.4 mLs (2 mg total) into the vein every 6 (six) hours as needed (or delirium).   HYDROmorphone 1 MG/ML injection Commonly known as: DILAUDID Inject 0.5-1 mLs (0.5-1 mg total) into the vein every 2 (two) hours as needed for severe pain or moderate pain (distress, shortness of breath, increased work of breathng, RR>25).   LORazepam 1 MG tablet Commonly known as: ATIVAN Take 1 tablet (1 mg total) by mouth every hour as needed for anxiety, seizure or sleep (distress).   LORazepam 2 MG/ML concentrated solution Commonly known as: ATIVAN Place 0.5 mLs (1 mg total) under the tongue every hour as needed for anxiety, seizure or sleep (distress).   ondansetron 4 MG/2ML Soln injection Commonly known as: ZOFRAN Inject 2 mLs (4 mg total) into the vein every 6 (six) hours as needed for nausea or vomiting.   polyvinyl alcohol 1.4 % ophthalmic solution Commonly known as: LIQUIFILM TEARS Place 1 drop into  both eyes 4 (four) times daily as needed for dry eyes.        Day of Discharge BP 100/70   Pulse (!) 137   Temp 98.5 F (36.9 C) (Axillary)   Resp 16   Ht 5\' 11"  (1.803 m)   Wt 63.4 kg   SpO2 95%   BMI 19.49 kg/m   Physical Exam: General: No acute respiratory distress - resting quietly at time of d/c   Basic Metabolic Panel: Recent Labs  Lab 10/22/22 0712 10/23/22 0344 10/24/22 1010 10/24/22 1400 10/25/22 0745 10/25/22 1520  NA 137  --  141 140 143  --   K 4.9  --  3.8 3.9 4.0  --   CL 101  --  106 108 106  --   CO2 22  --  22 19* 20*  --   GLUCOSE 133*  --  134* 120* 86 147*  BUN 48*  --  54* 49* 48*  --   CREATININE 2.46*  --  2.92* 2.96* 2.83*  --   CALCIUM 8.3*  --  8.6* 8.3* 8.6*  --   MG 2.2  --  2.2 2.2 2.2  --   PHOS  --  3.9 3.9  --  3.5  --    CBC: Recent Labs  Lab  10/22/22 0712 10/23/22 0348 10/24/22 1010 10/24/22 1400 10/25/22 0745  WBC 9.5 9.9 11.8* 12.0* 14.7*  NEUTROABS  --   --   --  9.6*  --   HGB 10.4* 10.2* 10.3* 9.8* 10.9*  HCT 31.9* 31.6* 31.4* 29.1* 33.1*  MCV 76.0* 77.5* 75.7* 75.6* 77.5*  PLT 215 213 223 197 188    Time spent in discharge (includes decision making & examination of pt): 35 minutes  10/27/2022, 2:41 PM   Lonia Blood, MD [facility] Office  [phone]

## 2022-10-27 NOTE — Progress Notes (Signed)
[facility] 4NP04 [name]) Hospital Liaison Note  Received request from PMT NP Amber Katrinka Blazing regarding family interest in [facility].  Spoke with patient's granddaughter Hessie Diener via phone to confirm interest and explain services. Patient eligibility confirmed and family is agreeable for transfer today.  TOC Dellie Burns and PMT NP aware.  RN--please call report to [phone] prior to patient leaving the unit.  Please leave all IV's in place for transport for ongoing symptom management needs.  Please send signed and completed DNR with patient at discharge.  Thank you for the opportunity to participate in this patient's care.  Doreatha Martin, RN [facility] Liaison [phone]

## 2022-10-27 NOTE — Progress Notes (Signed)
Per Palliative Medicine APP, pt's family has opted for full comfort measures and hopeful for bed at [facility]. Revonda Standard with [facility] to review for possible admission. TOC will continue to follow.   Dellie Burns, MSW, LCSW [phone] (coverage)

## 2022-10-27 NOTE — Progress Notes (Signed)
Daily Progress Note   Patient Name: [name]       Date: 10/27/2022 DOB: December 16, 1923  Age: 89 y.o. MRN#: [id number] Attending Physician: Lonia Blood, MD Primary Care Physician: Mliss Sax, MD Admit Date: 10/17/2022  Reason for Consultation/Follow-up: Non pain symptom management, Pain control, Psychosocial/spiritual support, and Terminal Care  Subjective: Notified TOC and [facility] hospice liaison of change in plan of care as of yesterday as well as family request for [facility].  I have reviewed medical records including EPIC notes, MAR, and labs. Comfort PRN medications utilized in 24 hours: benadryl x2, robinul x1, 1mg  dilaudid x2. Unable to receive report from primary RN.  Went to visit patient at bedside - no family/visitors present. Patient was lying in bed asleep - I did not attempt to wake him to preserve comfort. No signs or non-verbal gestures of pain or discomfort noted. No respiratory distress, increased work of breathing, or secretions noted. Patient is weak and frail appearing.   Length of Stay: 10  Current Medications: Scheduled Meds:   antiseptic oral rinse  15 mL Topical BID   fentaNYL  1 patch Transdermal Q72H    Continuous Infusions:   PRN Meds: acetaminophen **OR** acetaminophen, cetaphil, diphenhydrAMINE, glycopyrrolate **OR** glycopyrrolate **OR** glycopyrrolate, haloperidol **OR** haloperidol **OR** haloperidol lactate, HYDROmorphone (DILAUDID) injection, LORazepam **OR** LORazepam, ondansetron (ZOFRAN) IV, polyvinyl alcohol  Physical Exam Vitals and nursing note reviewed.  Constitutional:      General: He is not in acute distress.    Appearance: He is ill-appearing.  Pulmonary:     Effort: No respiratory distress.  Skin:     General: Skin is warm and dry.  Neurological:     Motor: Weakness present.             Vital Signs: BP 100/70   Pulse (!) 137   Temp 98.5 F (36.9 C) (Axillary)   Resp 16   Ht 5\' 11"  (1.803 m)   Wt 63.4 kg   SpO2 95%   BMI 19.49 kg/m  SpO2: SpO2: 95 % O2 Device: O2 Device: Room Air O2 Flow Rate: O2 Flow Rate (L/min): 2 L/min  Intake/output summary:  Intake/Output Summary (Last 24 hours) at 10/27/2022 0929 Last data filed at 10/27/2022 0643 Gross per 24 hour  Intake 101.13 ml  Output 150 ml  Net -  48.87 ml   LBM: Last BM Date : 10/24/22 Baseline Weight: Weight: 62 kg Most recent weight: Weight: 63.4 kg       Palliative Assessment/Data: PPS 10-20%      Patient Active Problem List   Diagnosis Date Noted   Chronic systolic CHF (congestive heart failure) ([name]) 10/18/2022   Cellulitis of left lower extremity 10/17/2022   Pericardial effusion 10/08/2022   CHF (congestive heart failure) ([name]) 10/07/2022   Gangrene of toe of left foot ([name]) 10/07/2022   LVH (left ventricular hypertrophy) 09/11/2022   Coronary atherosclerosis 08/15/2022   Encounter for fitting and adjustment of hearing aid 08/15/2022   Hearing loss 08/15/2022   Hypertensive heart disease with heart failure ([name]) 08/15/2022   Muscle weakness (generalized) 08/15/2022   Need for assistance with personal care 08/15/2022   Encounter for issue of other medical certificate 16/02/9603   Encounter for other administrative examinations 08/15/2022   Other specified hearing loss, bilateral 08/15/2022   Pain in joint, lower leg 08/15/2022   Problem related to unspecified psychosocial circumstances 08/15/2022   Cellulitis and abscess of toe of left foot 08/08/2022   Left leg cellulitis 08/08/2022   Bilateral pleural effusion 07/22/2022   DNR (do not resuscitate)/DNI(Do Not Intubate) 07/22/2022   Edema due to hypoalbuminemia 07/22/2022   Chronic hypoxic respiratory failure, on home oxygen therapy ([name]) - 4 L/min  07/22/2022   Pressure injury of skin 07/10/2022   Essential hypertension 07/06/2022   Alzheimer disease ([name]) 07/06/2022   Pleural effusion 07/06/2022   History of CVA (cerebrovascular accident) 07/06/2022   Ventral hernia 07/06/2022   Ulcer of lower extremity, limited to breakdown of skin ([name]) 05/30/2022   Urinary frequency 02/14/2022   Hypotension due to drugs 02/07/2022   Need for influenza vaccination 02/07/2022   History of anemia due to chronic kidney disease 12/31/2021   Agitation due to dementia ([name]) 12/25/2021   BPH (benign prostatic hyperplasia) 12/05/2021   Lower extremity edema 12/05/2021   Scrotum swelling 12/05/2021   Elevated homocysteine 11/30/2021   Low magnesium level 11/30/2021   Balanitis 11/30/2021   Gastroesophageal reflux disease 10/22/2021   Urinary tract infection 10/22/2021   Chronic atrial fibrillation with RVR ([name]) 07/30/2021   Hypophosphatemia 07/29/2021   Stage 3b chronic kidney disease ([name]) 04/25/2021   Continuous leakage of urine 04/25/2021   At high risk for injury related to fall 04/25/2021   Hypocalcemia 10/30/2020   Vitamin D deficiency 10/30/2020   Edema due to malnutrition ([name]) 10/30/2020   Unsteady gait when walking 09/21/2020   Sarcopenia 09/21/2020   B12 deficiency 01/27/2020   Acute on chronic diastolic CHF (congestive heart failure) ([name]) 03/09/2019   Pain in both feet 11/17/2018   Neuropathy 10/09/2018   Insomnia 10/09/2018   Slow transit constipation 06/18/2018   Iron deficiency anemia 11/25/2017   Decreased appetite 10/16/2017   Hearing difficulty of both ears 10/16/2017   Allergic rhinitis due to pollen 09/03/2017   Asymmetric SNHL (sensorineural hearing loss) 08/14/2017   Tinnitus aurium, left 08/14/2017   Ventricular tachycardia ([name]) 01/14/2017   Malnutrition of moderate degree 01/13/2017   Exertional dyspnea 01/12/2017   Hypertension    PVD (peripheral vascular disease) ([name]) 08/14/2016   CVA (cerebral infarction)  07/23/2015   Microcytic anemia 07/23/2015   Stroke (cerebrum) ([name]) 07/23/2015   CKD (chronic kidney disease) stage 4, GFR 15-29 ml/min ([name]) 07/23/2015   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 03/15/2015   Primary open-angle glaucoma, left eye, indeterminate stage 03/15/2015   PCO (posterior  capsule opacification) 11/29/2014   Ptosis of eyelid 07/27/2012   POAG (primary open-angle glaucoma) 11/27/2011   Trichiasis 11/27/2011   PPM-[employer] 03/13/2010   DM2 (diabetes mellitus, type 2) ([name]) 11/14/2008   Paroxysmal atrial fibrillation ([name]) 11/14/2008   BRADYCARDIA-TACHYCARDIA SYNDROME s/p PPM 11/14/2008    Palliative Care Assessment & Plan   Patient Profile: 89 y.o. male with past medical history of peripheral artery disease, CHF, dementia, afib, tachybrady syndrome s/p pacmaker, CKD IV, recently admitted 5/20-5/28 with volume overload resulting in pericardial infusion (cardiocentesis with 1033 cc off), L toe gangrene, discharged home on 5/28 and readmitted on 10/17/2022 with L lower extremity pain. Workup reveals cellulitis and critical limb ischemia. Recommendations per vascular, podiatry and cardiology have been comfort focused. He is being treated with antibiotics and MRI is pending. Palliative consulted for further goals of care.   Assessment: Principal Problem:   Cellulitis of left lower extremity Active Problems:   DM2 (diabetes mellitus, type 2) ([name])   Paroxysmal atrial fibrillation ([name])   CKD (chronic kidney disease) stage 4, GFR 15-29 ml/min ([name])   BPH (benign prostatic hyperplasia)   History of anemia due to chronic kidney disease   Chronic systolic CHF (congestive heart failure) ([name])   Terminal care  Recommendations/Plan: Continue full comfort measures Continue DNR/DNI as previously documented Transfer to [facility] pending confirmation of eligibility and bed availability Continue current comfort focused medication regimen PMT will continue to follow and support  holistically  Symptom Management Dilaudid PRN pain/dyspnea/increased work of breathing/RR>25 Continue fentanyl patch Tylenol PRN pain/fever Biotin twice daily Benadryl PRN itching Robinul PRN secretions Haldol PRN agitation/delirium Ativan PRN anxiety/seizure/sleep/distress Zofran PRN nausea/vomiting Liquifilm Tears PRN dry eye  Goals of Care and Additional Recommendations: Limitations on Scope of Treatment: Full Comfort Care  Code Status:    Code Status Orders  (From admission, onward)           Start     Ordered   10/26/22 1758  Do not attempt resuscitation (DNR)  Continuous       Question Answer Comment  If patient has no pulse and is not breathing Do Not Attempt Resuscitation   If patient has a pulse and/or is breathing: Medical Treatment Goals COMFORT MEASURES: Keep clean/warm/dry, use medication by any route; positioning, wound care and other measures to relieve pain/suffering; use oxygen, suction/manual treatment of airway obstruction for comfort; do not transfer unless for comfort needs.   Consent: Discussion documented in EHR or advanced directives reviewed      10/26/22 1800           Code Status History     Date Active Date Inactive Code Status Order ID Comments User Context   10/17/2022 2116 10/26/2022 1800 DNR [id number]  Angie Fava, DO ED   10/07/2022 1237 10/16/2022 1630 DNR [id number]  Clydie Braun, MD ED   08/08/2022 0019 08/09/2022 1636 DNR [id number]  Rometta Emery, MD ED   07/22/2022 2043 07/29/2022 1803 DNR [id number]  Carollee Herter, DO ED   07/22/2022 1955 07/22/2022 2043 DNR [id number]  Carollee Herter, DO ED   07/08/2022 1145 07/10/2022 1739 DNR [id number]  Dimple Nanas, MD Inpatient   07/06/2022 1629 07/08/2022 1145 Full Code [id number]  Orland Mustard, MD ED   07/06/2022 1614 07/06/2022 1629 Full Code [id number]  Orland Mustard, MD ED   02/04/2022 1444 02/04/2022 2142 Full Code [id number]  Marinus Maw, MD Inpatient   12/31/2021 1636 01/01/2022 1950 Full  Code [id number]  Joseph Art, DO  ED   07/29/2021 1252 08/01/2021 2233 Full Code [id number]  Bobette Mo, MD ED   12/01/2017 1750 12/05/2017 1725 Full Code [id number]  Randel Pigg, Dorma Russell, MD ED   01/12/2017 1000 01/17/2017 1356 Full Code [id number]  Alwyn Ren, MD Inpatient   08/14/2016 1733 08/15/2016 1641 Full Code [id number]  Iran Ouch, MD Inpatient   08/07/2016 1333 08/07/2016 2233 Full Code [id number]  Iran Ouch, MD Inpatient   07/23/2015 1610 07/25/2015 1803 Full Code [id number]  Gwenyth Bender, NP ED   08/17/2013 2011 08/19/2013 1744 Full Code [id number]  Henderson Cloud, MD Inpatient   02/15/2012 0025 02/18/2012 2115 Full Code [id number]  Ron Parker, MD Inpatient      Advance Directive Documentation    Flowsheet Row Most Recent Value  Type of Advance Directive Out of facility DNR (pink MOST or yellow form)  Pre-existing out of facility DNR order (yellow form or pink MOST form) --  "MOST" Form in Place? --       Prognosis:  < 2 weeks  Discharge Planning: Hospice facility  Care plan was discussed with [facility], hospice liaison  Thank you for allowing the Palliative Medicine Team to assist in the care of this patient.   Haskel Khan, NP  Please contact Palliative Medicine Team phone at [phone] for questions and concerns.   *Portions of this note are a verbal dictation therefore any spelling and/or grammatical errors are due to the "Dragon Medical One" system interpretation.

## 2022-10-27 NOTE — Progress Notes (Signed)
This nurse called report to [facility] where patient will transfer today via [facility] for inpatient hospice. This nurse spoke with Georgiann Hahn, RN that will be taking over care for this patient at [facility]. This nurse answered all questions regarding patient, plan of care etc. Shonette, RN has unit number in case she has any other questions or concerns.

## 2022-10-27 NOTE — Progress Notes (Signed)
Chart reviewed. Appears patient has moved to full comfort measures and looking at [facility] for Hospice. I discussed with primary team and palliative. We will sign off. Please call back with any changes.

## 2022-10-27 NOTE — TOC Transition Note (Signed)
Transition of Care [facility]) - CM/SW Discharge Note   Patient Details  Name: [name] MRN: [id number] Date of Birth: [birth year]  Transition of Care [facility]) CM/SW Contact:  Deatra Robinson, [place] Phone Number: 10/27/2022, 1:29 PM   Clinical Narrative:  Per Revonda Standard with [facility], pt has been approved for [facility] and they are prepared to admit today. Pt's granddtr has completed admission consents and is agreeable to dc. RN provided with number for report and [facility] arranged for transport. SW signing off at dc.   Dellie Burns, MSW, LCSW [phone] (coverage)       Final next level of care: Hospice Medical Facility Barriers to Discharge: No Barriers Identified   Patient Goals and CMS Choice      Discharge Placement                  Patient to be transferred to facility by: [facility] Name of family member notified: Keturah/granddtr Patient and family notified of of transfer: 10/27/22  Discharge Plan and Services Additional resources added to the After Visit Summary for                                       Social Determinants of Health (SDOH) Interventions SDOH Screenings   Food Insecurity: No Food Insecurity (10/09/2022)  Housing: Low Risk  (10/09/2022)  Transportation Needs: No Transportation Needs (10/09/2022)  Utilities: Not At Risk (10/09/2022)  Alcohol Screen: Low Risk  (08/19/2022)  Depression (PHQ2-9): Low Risk  (09/11/2022)  Financial Resource Strain: Low Risk  (08/19/2022)  Physical Activity: Sufficiently Active (08/19/2022)  Social Connections: Moderately Isolated (08/19/2022)  Stress: No Stress Concern Present (08/19/2022)  Tobacco Use: Medium Risk (10/18/2022)     Readmission Risk Interventions    10/13/2022    4:40 PM 08/08/2022    2:30 PM 07/08/2022   11:12 AM  Readmission Risk Prevention Plan  Transportation Screening Complete Complete Complete  Medication Review Oceanographer) Complete Complete Complete  PCP or Specialist appointment  within 3-5 days of discharge Complete Complete Complete  HRI or Home Care Consult Complete Complete Complete  SW Recovery Care/Counseling Consult Complete Complete Complete  Palliative Care Screening Not Applicable Complete Not Applicable  Skilled Nursing Facility Not Applicable Not Applicable Not Applicable

## 2022-10-28 ENCOUNTER — Telehealth: Payer: No Typology Code available for payment source

## 2022-10-28 ENCOUNTER — Ambulatory Visit: Payer: Self-pay

## 2022-10-28 ENCOUNTER — Encounter: Payer: Self-pay | Admitting: Family Medicine

## 2022-10-28 DIAGNOSIS — I5032 Chronic diastolic (congestive) heart failure: Secondary | ICD-10-CM

## 2022-10-28 DIAGNOSIS — F03911 Unspecified dementia, unspecified severity, with agitation: Secondary | ICD-10-CM

## 2022-10-28 DIAGNOSIS — Z09 Encounter for follow-up examination after completed treatment for conditions other than malignant neoplasm: Secondary | ICD-10-CM

## 2022-10-28 NOTE — Chronic Care Management (AMB) (Signed)
   10/28/2022  [name] 1924/03/27 [id number]   The patient was discharged on 10-27-2022 to [facility] under Hospice Services. The patient no longer qualifies for CCM Services. Message received from the patients grandaughter today asking for appointment to be cancelled due to her having a meeting about guardianship. Did attempt to call the patients granddaughter and left a message.   Closing plan of care. Changing from enrolled to previously enrolled. Will continue to monitor and talk with the patients granddaughter as needed.  Alto Denver RN, MSN, CCM RN Care Manager  Chronic Care Management Direct Number: [phone]

## 2022-10-28 NOTE — Patient Instructions (Signed)
The patient has been discharged to [facility] under [facility].  Closing CCM services.   Alto Denver RN, MSN, CCM RN Care Manager  Chronic Care Management Direct Number: [phone]

## 2022-10-29 ENCOUNTER — Ambulatory Visit: Payer: No Typology Code available for payment source | Admitting: Student

## 2022-10-31 ENCOUNTER — Telehealth: Payer: Self-pay

## 2022-10-31 NOTE — Telephone Encounter (Signed)
Error

## 2022-10-31 NOTE — Telephone Encounter (Signed)
   CCM RN Visit Note   @DATE @ Name: [name] MRN: [id number]      DOB: 1924/05/15  Subjective: [name] is a 89 y.o. year old male who is a primary care patient of Mliss Sax, MD. The patient was referred to the Chronic Care Management team for assistance with care management needs subsequent to provider initiation of CCM services and plan of care.      Today's Visit:  Incoming call from the patients granddaughter Hessie Diener  for  letting the [facility] know that her granddad had passed away on 11-09-2022 .     Judithann Sauger the patients granddaughter called to let the RNCM know that she appreciated everything that was done to help her granddad and that he peacefully passed away on 2022/11/09. She was able to be with him.  Gave the granddaughter information about [place] [employer] and how to find local groups at [email or web address]  Will mark the patients record as deceased.     Plan:No further follow up required: patient has expired  Alto Denver RN, MSN, CCM RN Care Manager  Chronic Care Management Direct Number: [phone]

## 2022-11-04 NOTE — Progress Notes (Signed)
Remote pacemaker transmission.   

## 2022-11-07 ENCOUNTER — Ambulatory Visit: Payer: BC Managed Care – PPO | Admitting: Family Medicine

## 2022-11-07 DIAGNOSIS — I10 Essential (primary) hypertension: Secondary | ICD-10-CM | POA: Diagnosis not present

## 2022-11-07 DIAGNOSIS — J90 Pleural effusion, not elsewhere classified: Secondary | ICD-10-CM | POA: Diagnosis not present

## 2022-11-08 DIAGNOSIS — J90 Pleural effusion, not elsewhere classified: Secondary | ICD-10-CM | POA: Diagnosis not present

## 2022-11-18 DEATH — deceased

## 2022-11-26 ENCOUNTER — Ambulatory Visit: Payer: No Typology Code available for payment source | Admitting: Cardiovascular Disease

## 2023-09-04 ENCOUNTER — Ambulatory Visit: Payer: BC Managed Care – PPO | Admitting: Neurology
